# Patient Record
Sex: Female | Born: 1940 | Race: Black or African American | Hispanic: No | Smoking: Never smoker
Health system: Southern US, Community
[De-identification: ages and names within clinical notes are randomized; demographics above are authoritative.]

## PROBLEM LIST (undated history)

## (undated) DIAGNOSIS — N1831 Chronic kidney disease, stage 3a: Secondary | ICD-10-CM

## (undated) DIAGNOSIS — E0590 Thyrotoxicosis, unspecified without thyrotoxic crisis or storm: Secondary | ICD-10-CM

## (undated) DIAGNOSIS — N280 Ischemia and infarction of kidney: Secondary | ICD-10-CM

## (undated) DIAGNOSIS — I10 Essential (primary) hypertension: Secondary | ICD-10-CM

## (undated) DIAGNOSIS — E119 Type 2 diabetes mellitus without complications: Secondary | ICD-10-CM

## (undated) DIAGNOSIS — J189 Pneumonia, unspecified organism: Secondary | ICD-10-CM

## (undated) DIAGNOSIS — K760 Fatty (change of) liver, not elsewhere classified: Secondary | ICD-10-CM

## (undated) DIAGNOSIS — I700 Atherosclerosis of aorta: Secondary | ICD-10-CM

## (undated) DIAGNOSIS — K56609 Unspecified intestinal obstruction, unspecified as to partial versus complete obstruction: Secondary | ICD-10-CM

## (undated) DIAGNOSIS — I639 Cerebral infarction, unspecified: Secondary | ICD-10-CM

## (undated) DIAGNOSIS — M1990 Unspecified osteoarthritis, unspecified site: Secondary | ICD-10-CM

## (undated) DIAGNOSIS — J9859 Other diseases of mediastinum, not elsewhere classified: Secondary | ICD-10-CM

## (undated) DIAGNOSIS — I272 Pulmonary hypertension, unspecified: Secondary | ICD-10-CM

## (undated) DIAGNOSIS — E785 Hyperlipidemia, unspecified: Secondary | ICD-10-CM

## (undated) DIAGNOSIS — K219 Gastro-esophageal reflux disease without esophagitis: Secondary | ICD-10-CM

## (undated) DIAGNOSIS — J9610 Chronic respiratory failure, unspecified whether with hypoxia or hypercapnia: Secondary | ICD-10-CM

## (undated) DIAGNOSIS — I2510 Atherosclerotic heart disease of native coronary artery without angina pectoris: Secondary | ICD-10-CM

## (undated) DIAGNOSIS — N838 Other noninflammatory disorders of ovary, fallopian tube and broad ligament: Secondary | ICD-10-CM

## (undated) DIAGNOSIS — M810 Age-related osteoporosis without current pathological fracture: Secondary | ICD-10-CM

## (undated) HISTORY — DX: Type 2 diabetes mellitus without complications: E11.9

## (undated) HISTORY — DX: Essential (primary) hypertension: I10

## (undated) HISTORY — DX: Age-related osteoporosis without current pathological fracture: M81.0

## (undated) HISTORY — PX: COLONOSCOPY: SHX174

## (undated) HISTORY — PX: CATARACT EXTRACTION W/ INTRAOCULAR LENS  IMPLANT, BILATERAL: SHX1307

## (undated) HISTORY — DX: Hyperlipidemia, unspecified: E78.5

---
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
## 2005-04-08 ENCOUNTER — Ambulatory Visit: Payer: Self-pay | Admitting: Family Medicine

## 2005-04-08 ENCOUNTER — Ambulatory Visit: Payer: Self-pay | Admitting: *Deleted

## 2005-04-29 ENCOUNTER — Ambulatory Visit: Payer: Self-pay | Admitting: Family Medicine

## 2005-05-31 ENCOUNTER — Ambulatory Visit: Payer: Self-pay | Admitting: Family Medicine

## 2005-06-02 ENCOUNTER — Ambulatory Visit (HOSPITAL_COMMUNITY): Admission: RE | Admit: 2005-06-02 | Discharge: 2005-06-02 | Payer: Self-pay | Admitting: Internal Medicine

## 2005-06-17 ENCOUNTER — Ambulatory Visit: Payer: Self-pay | Admitting: Family Medicine

## 2005-08-02 ENCOUNTER — Ambulatory Visit: Payer: Self-pay | Admitting: Family Medicine

## 2005-08-05 ENCOUNTER — Ambulatory Visit: Payer: Self-pay | Admitting: Family Medicine

## 2005-10-10 ENCOUNTER — Ambulatory Visit: Payer: Self-pay | Admitting: Family Medicine

## 2005-12-05 ENCOUNTER — Ambulatory Visit: Payer: Self-pay | Admitting: Family Medicine

## 2005-12-22 ENCOUNTER — Ambulatory Visit: Payer: Self-pay | Admitting: Family Medicine

## 2006-04-25 ENCOUNTER — Ambulatory Visit: Payer: Self-pay | Admitting: Family Medicine

## 2006-05-30 ENCOUNTER — Ambulatory Visit: Payer: Self-pay | Admitting: Family Medicine

## 2006-05-30 ENCOUNTER — Encounter (INDEPENDENT_AMBULATORY_CARE_PROVIDER_SITE_OTHER): Payer: Self-pay | Admitting: Family Medicine

## 2006-06-05 ENCOUNTER — Ambulatory Visit: Payer: Self-pay | Admitting: Family Medicine

## 2006-09-20 ENCOUNTER — Encounter (INDEPENDENT_AMBULATORY_CARE_PROVIDER_SITE_OTHER): Payer: Self-pay | Admitting: *Deleted

## 2006-11-09 ENCOUNTER — Ambulatory Visit: Payer: Self-pay | Admitting: Internal Medicine

## 2007-04-23 ENCOUNTER — Ambulatory Visit: Payer: Self-pay | Admitting: Internal Medicine

## 2007-07-11 ENCOUNTER — Ambulatory Visit: Payer: Self-pay | Admitting: Internal Medicine

## 2007-07-11 ENCOUNTER — Encounter: Payer: Self-pay | Admitting: Family Medicine

## 2007-07-11 LAB — CONVERTED CEMR LAB
ALT: 13 units/L (ref 0–35)
Albumin: 4.2 g/dL (ref 3.5–5.2)
Alkaline Phosphatase: 55 units/L (ref 39–117)
BUN: 25 mg/dL — ABNORMAL HIGH (ref 6–23)
Basophils Absolute: 0 10*3/uL (ref 0.0–0.1)
Basophils Relative: 0 % (ref 0–1)
CO2: 26 meq/L (ref 19–32)
Calcium: 9.3 mg/dL (ref 8.4–10.5)
Chloride: 103 meq/L (ref 96–112)
Cholesterol: 137 mg/dL (ref 0–200)
Creatinine, Ser: 0.76 mg/dL (ref 0.40–1.20)
Eosinophils Relative: 4 % (ref 0–5)
HCT: 38.8 % (ref 36.0–46.0)
HDL: 54 mg/dL (ref >39)
LDL Cholesterol: 75 mg/dL (ref 0–99)
Lymphocytes Relative: 56 % — ABNORMAL HIGH (ref 12–46)
MCHC: 32.5 g/dL (ref 30.0–36.0)
MCV: 84.9 fL (ref 78.0–100.0)
Monocytes Absolute: 0.3 10*3/uL (ref 0.1–1.0)
RBC: 4.57 M/uL (ref 3.87–5.11)
RDW: 16.2 % — ABNORMAL HIGH (ref 11.5–15.5)
Sodium: 141 meq/L (ref 135–145)
TSH: 0.441 microintl units/mL (ref 0.350–4.50)
Triglycerides: 42 mg/dL (ref <150)
VLDL: 8 mg/dL (ref 0–40)

## 2007-09-07 ENCOUNTER — Ambulatory Visit: Payer: Self-pay | Admitting: Family Medicine

## 2007-09-17 ENCOUNTER — Ambulatory Visit: Payer: Self-pay | Admitting: Internal Medicine

## 2007-12-26 ENCOUNTER — Ambulatory Visit: Payer: Self-pay | Admitting: Internal Medicine

## 2008-02-01 ENCOUNTER — Ambulatory Visit: Payer: Self-pay | Admitting: Family Medicine

## 2008-02-21 ENCOUNTER — Ambulatory Visit (HOSPITAL_COMMUNITY): Admission: RE | Admit: 2008-02-21 | Discharge: 2008-02-21 | Payer: Self-pay | Admitting: Internal Medicine

## 2008-02-28 ENCOUNTER — Encounter: Admission: RE | Admit: 2008-02-28 | Discharge: 2008-02-28 | Payer: Self-pay | Admitting: Internal Medicine

## 2008-03-14 ENCOUNTER — Ambulatory Visit: Payer: Self-pay | Admitting: Internal Medicine

## 2008-04-07 ENCOUNTER — Encounter: Payer: Self-pay | Admitting: Internal Medicine

## 2008-04-07 ENCOUNTER — Ambulatory Visit: Payer: Self-pay | Admitting: Family Medicine

## 2008-04-07 DIAGNOSIS — E785 Hyperlipidemia, unspecified: Secondary | ICD-10-CM

## 2008-04-07 DIAGNOSIS — K219 Gastro-esophageal reflux disease without esophagitis: Secondary | ICD-10-CM | POA: Insufficient documentation

## 2008-04-07 DIAGNOSIS — E119 Type 2 diabetes mellitus without complications: Secondary | ICD-10-CM | POA: Insufficient documentation

## 2008-04-07 DIAGNOSIS — J029 Acute pharyngitis, unspecified: Secondary | ICD-10-CM | POA: Insufficient documentation

## 2008-04-07 DIAGNOSIS — I10 Essential (primary) hypertension: Secondary | ICD-10-CM

## 2008-04-28 ENCOUNTER — Ambulatory Visit: Payer: Self-pay | Admitting: Internal Medicine

## 2008-06-10 ENCOUNTER — Ambulatory Visit: Payer: Self-pay | Admitting: Family Medicine

## 2008-06-16 ENCOUNTER — Ambulatory Visit: Payer: Self-pay | Admitting: Family Medicine

## 2009-04-29 ENCOUNTER — Ambulatory Visit: Payer: Self-pay | Admitting: Internal Medicine

## 2009-06-30 ENCOUNTER — Encounter (INDEPENDENT_AMBULATORY_CARE_PROVIDER_SITE_OTHER): Payer: Self-pay | Admitting: Adult Health

## 2009-06-30 ENCOUNTER — Ambulatory Visit: Payer: Self-pay | Admitting: Internal Medicine

## 2009-06-30 LAB — CONVERTED CEMR LAB
ALT: 15 units/L (ref 0–35)
CO2: 31 meq/L (ref 19–32)
Chloride: 98 meq/L (ref 96–112)
HDL: 62 mg/dL (ref >39)
LDL Cholesterol: 107 mg/dL — ABNORMAL HIGH (ref 0–99)
Microalb, Ur: 1.17 mg/dL (ref 0.00–1.89)
Potassium: 3.5 meq/L (ref 3.5–5.3)
Sodium: 141 meq/L (ref 135–145)
Triglycerides: 98 mg/dL (ref <150)
Vit D, 25-Hydroxy: 36 ng/mL (ref 30–89)

## 2009-07-15 ENCOUNTER — Ambulatory Visit (HOSPITAL_COMMUNITY): Admission: RE | Admit: 2009-07-15 | Discharge: 2009-07-15 | Payer: Self-pay | Admitting: Family Medicine

## 2009-11-30 ENCOUNTER — Encounter (INDEPENDENT_AMBULATORY_CARE_PROVIDER_SITE_OTHER): Payer: Self-pay | Admitting: *Deleted

## 2009-11-30 LAB — CONVERTED CEMR LAB
AST: 15 units/L (ref 0–37)
Albumin: 4.2 g/dL (ref 3.5–5.2)
Alkaline Phosphatase: 45 units/L (ref 39–117)
CO2: 32 meq/L (ref 19–32)
Chloride: 101 meq/L (ref 96–112)
Creatinine, Ser: 0.67 mg/dL (ref 0.40–1.20)
HDL: 61 mg/dL (ref >39)
Total Bilirubin: 0.3 mg/dL (ref 0.3–1.2)
Total CHOL/HDL Ratio: 2.8
Triglycerides: 81 mg/dL (ref <150)

## 2010-07-27 ENCOUNTER — Other Ambulatory Visit: Payer: Self-pay | Admitting: Internal Medicine

## 2010-07-27 DIAGNOSIS — Z1231 Encounter for screening mammogram for malignant neoplasm of breast: Secondary | ICD-10-CM

## 2010-08-20 ENCOUNTER — Ambulatory Visit
Admission: RE | Admit: 2010-08-20 | Discharge: 2010-08-20 | Disposition: A | Discharge status: Home / Self Care | Payer: Medicare Other | Source: Ambulatory Visit | Attending: Internal Medicine | Admitting: Internal Medicine

## 2010-08-20 DIAGNOSIS — Z1231 Encounter for screening mammogram for malignant neoplasm of breast: Secondary | ICD-10-CM

## 2011-01-12 DIAGNOSIS — Z23 Encounter for immunization: Secondary | ICD-10-CM | POA: Diagnosis not present

## 2011-02-01 DIAGNOSIS — E1049 Type 1 diabetes mellitus with other diabetic neurological complication: Secondary | ICD-10-CM | POA: Diagnosis not present

## 2011-02-01 DIAGNOSIS — I10 Essential (primary) hypertension: Secondary | ICD-10-CM | POA: Diagnosis not present

## 2011-02-01 DIAGNOSIS — E119 Type 2 diabetes mellitus without complications: Secondary | ICD-10-CM | POA: Diagnosis not present

## 2011-05-03 DIAGNOSIS — E785 Hyperlipidemia, unspecified: Secondary | ICD-10-CM | POA: Diagnosis not present

## 2011-05-03 DIAGNOSIS — E119 Type 2 diabetes mellitus without complications: Secondary | ICD-10-CM | POA: Diagnosis not present

## 2011-05-03 DIAGNOSIS — I10 Essential (primary) hypertension: Secondary | ICD-10-CM | POA: Diagnosis not present

## 2011-08-02 DIAGNOSIS — E119 Type 2 diabetes mellitus without complications: Secondary | ICD-10-CM | POA: Diagnosis not present

## 2011-08-02 DIAGNOSIS — E785 Hyperlipidemia, unspecified: Secondary | ICD-10-CM | POA: Diagnosis not present

## 2011-08-02 DIAGNOSIS — I10 Essential (primary) hypertension: Secondary | ICD-10-CM | POA: Diagnosis not present

## 2011-08-12 ENCOUNTER — Other Ambulatory Visit: Payer: Self-pay | Admitting: Internal Medicine

## 2011-08-12 DIAGNOSIS — Z1231 Encounter for screening mammogram for malignant neoplasm of breast: Secondary | ICD-10-CM

## 2011-09-01 ENCOUNTER — Ambulatory Visit
Admission: RE | Admit: 2011-09-01 | Discharge: 2011-09-01 | Disposition: A | Discharge status: Home / Self Care | Payer: Medicare Other | Source: Ambulatory Visit | Attending: Internal Medicine | Admitting: Internal Medicine

## 2011-09-01 DIAGNOSIS — Z1231 Encounter for screening mammogram for malignant neoplasm of breast: Secondary | ICD-10-CM

## 2011-09-16 DIAGNOSIS — I10 Essential (primary) hypertension: Secondary | ICD-10-CM | POA: Diagnosis not present

## 2011-09-16 DIAGNOSIS — H35039 Hypertensive retinopathy, unspecified eye: Secondary | ICD-10-CM | POA: Diagnosis not present

## 2011-09-16 DIAGNOSIS — H43819 Vitreous degeneration, unspecified eye: Secondary | ICD-10-CM | POA: Diagnosis not present

## 2011-09-16 DIAGNOSIS — E119 Type 2 diabetes mellitus without complications: Secondary | ICD-10-CM | POA: Diagnosis not present

## 2011-11-01 DIAGNOSIS — Z6839 Body mass index (BMI) 39.0-39.9, adult: Secondary | ICD-10-CM | POA: Diagnosis not present

## 2011-11-01 DIAGNOSIS — Z23 Encounter for immunization: Secondary | ICD-10-CM | POA: Diagnosis not present

## 2011-11-01 DIAGNOSIS — I10 Essential (primary) hypertension: Secondary | ICD-10-CM | POA: Diagnosis not present

## 2011-11-01 DIAGNOSIS — E119 Type 2 diabetes mellitus without complications: Secondary | ICD-10-CM | POA: Diagnosis not present

## 2011-11-29 DIAGNOSIS — I10 Essential (primary) hypertension: Secondary | ICD-10-CM | POA: Diagnosis not present

## 2011-11-29 DIAGNOSIS — E119 Type 2 diabetes mellitus without complications: Secondary | ICD-10-CM | POA: Diagnosis not present

## 2011-11-29 DIAGNOSIS — E785 Hyperlipidemia, unspecified: Secondary | ICD-10-CM | POA: Diagnosis not present

## 2011-11-29 DIAGNOSIS — Z6838 Body mass index (BMI) 38.0-38.9, adult: Secondary | ICD-10-CM | POA: Diagnosis not present

## 2012-02-09 DIAGNOSIS — E785 Hyperlipidemia, unspecified: Secondary | ICD-10-CM | POA: Diagnosis not present

## 2012-02-09 DIAGNOSIS — E119 Type 2 diabetes mellitus without complications: Secondary | ICD-10-CM | POA: Diagnosis not present

## 2012-02-09 DIAGNOSIS — I10 Essential (primary) hypertension: Secondary | ICD-10-CM | POA: Diagnosis not present

## 2012-05-17 DIAGNOSIS — I10 Essential (primary) hypertension: Secondary | ICD-10-CM | POA: Diagnosis not present

## 2012-05-17 DIAGNOSIS — E119 Type 2 diabetes mellitus without complications: Secondary | ICD-10-CM | POA: Diagnosis not present

## 2012-05-17 DIAGNOSIS — E785 Hyperlipidemia, unspecified: Secondary | ICD-10-CM | POA: Diagnosis not present

## 2012-10-16 DIAGNOSIS — K3189 Other diseases of stomach and duodenum: Secondary | ICD-10-CM | POA: Diagnosis not present

## 2012-10-16 DIAGNOSIS — I10 Essential (primary) hypertension: Secondary | ICD-10-CM | POA: Diagnosis not present

## 2012-10-16 DIAGNOSIS — Z23 Encounter for immunization: Secondary | ICD-10-CM | POA: Diagnosis not present

## 2012-10-16 DIAGNOSIS — E119 Type 2 diabetes mellitus without complications: Secondary | ICD-10-CM | POA: Diagnosis not present

## 2012-11-02 ENCOUNTER — Other Ambulatory Visit: Payer: Self-pay

## 2012-11-02 DIAGNOSIS — Z1231 Encounter for screening mammogram for malignant neoplasm of breast: Secondary | ICD-10-CM

## 2012-11-09 DIAGNOSIS — H353190 Nonexudative age-related macular degeneration, unspecified eye, stage unspecified: Secondary | ICD-10-CM | POA: Diagnosis not present

## 2012-11-09 DIAGNOSIS — H35039 Hypertensive retinopathy, unspecified eye: Secondary | ICD-10-CM | POA: Diagnosis not present

## 2012-11-09 DIAGNOSIS — H43819 Vitreous degeneration, unspecified eye: Secondary | ICD-10-CM | POA: Diagnosis not present

## 2012-12-07 ENCOUNTER — Ambulatory Visit: Payer: Medicare Other

## 2013-01-16 ENCOUNTER — Ambulatory Visit
Admission: RE | Admit: 2013-01-16 | Discharge: 2013-01-16 | Disposition: A | Discharge status: Home / Self Care | Payer: Medicare Other | Source: Ambulatory Visit

## 2013-01-16 DIAGNOSIS — Z1231 Encounter for screening mammogram for malignant neoplasm of breast: Secondary | ICD-10-CM

## 2013-01-17 ENCOUNTER — Other Ambulatory Visit: Payer: Self-pay | Admitting: Internal Medicine

## 2013-01-17 DIAGNOSIS — R928 Other abnormal and inconclusive findings on diagnostic imaging of breast: Secondary | ICD-10-CM

## 2013-01-30 ENCOUNTER — Other Ambulatory Visit: Payer: Medicare Other

## 2013-02-08 ENCOUNTER — Ambulatory Visit
Admission: RE | Admit: 2013-02-08 | Discharge: 2013-02-08 | Disposition: A | Discharge status: Home / Self Care | Payer: Medicare Other | Source: Ambulatory Visit | Attending: Internal Medicine | Admitting: Internal Medicine

## 2013-02-08 DIAGNOSIS — R928 Other abnormal and inconclusive findings on diagnostic imaging of breast: Secondary | ICD-10-CM

## 2013-02-20 DIAGNOSIS — E785 Hyperlipidemia, unspecified: Secondary | ICD-10-CM | POA: Diagnosis not present

## 2013-02-20 DIAGNOSIS — E1149 Type 2 diabetes mellitus with other diabetic neurological complication: Secondary | ICD-10-CM | POA: Diagnosis not present

## 2013-02-20 DIAGNOSIS — I10 Essential (primary) hypertension: Secondary | ICD-10-CM | POA: Diagnosis not present

## 2013-02-20 DIAGNOSIS — M1710 Unilateral primary osteoarthritis, unspecified knee: Secondary | ICD-10-CM | POA: Diagnosis not present

## 2013-06-19 DIAGNOSIS — E1149 Type 2 diabetes mellitus with other diabetic neurological complication: Secondary | ICD-10-CM | POA: Diagnosis not present

## 2013-06-19 DIAGNOSIS — E785 Hyperlipidemia, unspecified: Secondary | ICD-10-CM | POA: Diagnosis not present

## 2013-06-19 DIAGNOSIS — I10 Essential (primary) hypertension: Secondary | ICD-10-CM | POA: Diagnosis not present

## 2013-06-19 DIAGNOSIS — J209 Acute bronchitis, unspecified: Secondary | ICD-10-CM | POA: Diagnosis not present

## 2013-07-18 DIAGNOSIS — E785 Hyperlipidemia, unspecified: Secondary | ICD-10-CM | POA: Diagnosis not present

## 2013-07-18 DIAGNOSIS — M1710 Unilateral primary osteoarthritis, unspecified knee: Secondary | ICD-10-CM | POA: Diagnosis not present

## 2013-07-18 DIAGNOSIS — I10 Essential (primary) hypertension: Secondary | ICD-10-CM | POA: Diagnosis not present

## 2013-07-18 DIAGNOSIS — E1149 Type 2 diabetes mellitus with other diabetic neurological complication: Secondary | ICD-10-CM | POA: Diagnosis not present

## 2013-12-03 DIAGNOSIS — M179 Osteoarthritis of knee, unspecified: Secondary | ICD-10-CM | POA: Diagnosis not present

## 2013-12-03 DIAGNOSIS — E1140 Type 2 diabetes mellitus with diabetic neuropathy, unspecified: Secondary | ICD-10-CM | POA: Diagnosis not present

## 2013-12-03 DIAGNOSIS — M810 Age-related osteoporosis without current pathological fracture: Secondary | ICD-10-CM | POA: Diagnosis not present

## 2013-12-03 DIAGNOSIS — Z23 Encounter for immunization: Secondary | ICD-10-CM | POA: Diagnosis not present

## 2013-12-03 DIAGNOSIS — Z6841 Body Mass Index (BMI) 40.0 and over, adult: Secondary | ICD-10-CM | POA: Diagnosis not present

## 2013-12-03 DIAGNOSIS — I10 Essential (primary) hypertension: Secondary | ICD-10-CM | POA: Diagnosis not present

## 2013-12-05 ENCOUNTER — Other Ambulatory Visit: Payer: Self-pay | Admitting: Internal Medicine

## 2013-12-16 ENCOUNTER — Other Ambulatory Visit (HOSPITAL_COMMUNITY): Payer: Self-pay | Admitting: Internal Medicine

## 2013-12-16 DIAGNOSIS — M810 Age-related osteoporosis without current pathological fracture: Secondary | ICD-10-CM

## 2013-12-31 ENCOUNTER — Ambulatory Visit (HOSPITAL_COMMUNITY)
Admission: RE | Admit: 2013-12-31 | Discharge: 2013-12-31 | Disposition: A | Discharge status: Home / Self Care | Payer: Medicare Other | Source: Ambulatory Visit | Attending: Internal Medicine | Admitting: Internal Medicine

## 2013-12-31 DIAGNOSIS — M8589 Other specified disorders of bone density and structure, multiple sites: Secondary | ICD-10-CM | POA: Diagnosis not present

## 2013-12-31 DIAGNOSIS — Z780 Asymptomatic menopausal state: Secondary | ICD-10-CM | POA: Diagnosis not present

## 2013-12-31 DIAGNOSIS — M810 Age-related osteoporosis without current pathological fracture: Secondary | ICD-10-CM | POA: Diagnosis not present

## 2014-03-04 ENCOUNTER — Other Ambulatory Visit: Payer: Self-pay

## 2014-03-04 DIAGNOSIS — Z1239 Encounter for other screening for malignant neoplasm of breast: Secondary | ICD-10-CM | POA: Diagnosis not present

## 2014-03-04 DIAGNOSIS — I10 Essential (primary) hypertension: Secondary | ICD-10-CM | POA: Diagnosis not present

## 2014-03-04 DIAGNOSIS — M179 Osteoarthritis of knee, unspecified: Secondary | ICD-10-CM | POA: Diagnosis not present

## 2014-03-04 DIAGNOSIS — Z1211 Encounter for screening for malignant neoplasm of colon: Secondary | ICD-10-CM | POA: Diagnosis not present

## 2014-03-04 DIAGNOSIS — M810 Age-related osteoporosis without current pathological fracture: Secondary | ICD-10-CM | POA: Diagnosis not present

## 2014-03-04 DIAGNOSIS — Z1231 Encounter for screening mammogram for malignant neoplasm of breast: Secondary | ICD-10-CM

## 2014-03-04 DIAGNOSIS — E1140 Type 2 diabetes mellitus with diabetic neuropathy, unspecified: Secondary | ICD-10-CM | POA: Diagnosis not present

## 2014-03-04 DIAGNOSIS — K219 Gastro-esophageal reflux disease without esophagitis: Secondary | ICD-10-CM | POA: Diagnosis not present

## 2014-03-04 DIAGNOSIS — E784 Other hyperlipidemia: Secondary | ICD-10-CM | POA: Diagnosis not present

## 2014-03-05 ENCOUNTER — Encounter: Payer: Self-pay | Admitting: Internal Medicine

## 2014-03-12 ENCOUNTER — Ambulatory Visit
Admission: RE | Admit: 2014-03-12 | Discharge: 2014-03-12 | Disposition: A | Discharge status: Home / Self Care | Payer: Medicare Other | Source: Ambulatory Visit

## 2014-03-12 ENCOUNTER — Encounter (INDEPENDENT_AMBULATORY_CARE_PROVIDER_SITE_OTHER): Payer: Self-pay

## 2014-03-12 DIAGNOSIS — Z1231 Encounter for screening mammogram for malignant neoplasm of breast: Secondary | ICD-10-CM | POA: Diagnosis not present

## 2014-04-09 DIAGNOSIS — H3530 Unspecified macular degeneration: Secondary | ICD-10-CM | POA: Diagnosis not present

## 2014-04-09 DIAGNOSIS — H2512 Age-related nuclear cataract, left eye: Secondary | ICD-10-CM | POA: Diagnosis not present

## 2014-04-25 ENCOUNTER — Ambulatory Visit (AMBULATORY_SURGERY_CENTER): Payer: Self-pay | Admitting: *Deleted

## 2014-04-25 VITALS — Ht <= 58 in | Wt 191.0 lb

## 2014-04-25 DIAGNOSIS — Z1211 Encounter for screening for malignant neoplasm of colon: Secondary | ICD-10-CM

## 2014-04-25 MED ORDER — MOVIPREP 100 G PO SOLR: ORAL | Status: DC

## 2014-04-25 NOTE — Progress Notes (Signed)
Daughter is at pt's side interpreting. Patient/daughter denies any allergies to eggs or soy. Patient denies any problems with sedation, denies any oxygen use at home and does not take any diet/weight loss medications. EMMI education assisgned to patient on colonoscopy, this was explained and instructions given to patient's daughter.

## 2014-04-28 ENCOUNTER — Telehealth: Payer: Self-pay | Admitting: *Deleted

## 2014-04-28 MED ORDER — NA SULFATE-K SULFATE-MG SULF 17.5-3.13-1.6 GM/177ML PO SOLN: 1.0000 | Freq: Once | ORAL | Status: AC

## 2014-04-28 NOTE — Telephone Encounter (Signed)
Moviprep was not covered by insurance. Sent Rx for [employer] to [facility] off [address] on 04/28/14.

## 2014-05-09 ENCOUNTER — Other Ambulatory Visit: Payer: Self-pay | Admitting: Internal Medicine

## 2014-05-09 ENCOUNTER — Encounter: Payer: Self-pay | Admitting: Internal Medicine

## 2014-05-09 ENCOUNTER — Ambulatory Visit (AMBULATORY_SURGERY_CENTER): Payer: Medicare Other | Admitting: Internal Medicine

## 2014-05-09 VITALS — BP 128/70 | HR 68 | Temp 97.4°F | Resp 19 | Ht <= 58 in | Wt 191.0 lb

## 2014-05-09 DIAGNOSIS — E119 Type 2 diabetes mellitus without complications: Secondary | ICD-10-CM | POA: Diagnosis not present

## 2014-05-09 DIAGNOSIS — Z1211 Encounter for screening for malignant neoplasm of colon: Secondary | ICD-10-CM

## 2014-05-09 DIAGNOSIS — I10 Essential (primary) hypertension: Secondary | ICD-10-CM | POA: Diagnosis not present

## 2014-05-09 DIAGNOSIS — K621 Rectal polyp: Secondary | ICD-10-CM

## 2014-05-09 DIAGNOSIS — D128 Benign neoplasm of rectum: Secondary | ICD-10-CM

## 2014-05-09 MED ORDER — SODIUM CHLORIDE 0.9 % IV SOLN: 500.0000 mL | INTRAVENOUS | Status: DC

## 2014-05-09 NOTE — Progress Notes (Signed)
Daughter [name], Veterinary surgeon for mother Government social research officer language for communication

## 2014-05-09 NOTE — Op Note (Signed)
[facility]  [address] [place], [address]   COLONOSCOPY PROCEDURE REPORT  PATIENT: Mandy [name], Mandy [name]  MR#: [id number] BIRTHDATE: May 18, 1940 , 32  yrs. old GENDER: female ENDOSCOPIST: Lafayette Dragon, MD REFERRED OI:[id number] Jeanie Cooks, M.D. PROCEDURE DATE:  05/09/2014 PROCEDURE:   Colonoscopy, screening and Colonoscopy with biopsy First Screening Colonoscopy - Avg.  risk and is 50 yrs.  old or older Yes.  Prior Negative Screening - Now for repeat screening. N/A  History of Adenoma - Now for follow-up colonoscopy & has been > or = to 3 yrs.  N/A  Polyps Removed Today ASA CLASS:   Class II INDICATIONS:Screening for colonic neoplasia and Colorectal Neoplasm Risk Assessment for this procedure is average risk. MEDICATIONS: Monitored anesthesia care and Propofol 200 mg IV  DESCRIPTION OF PROCEDURE:   After the risks benefits and alternatives of the procedure were thoroughly explained, informed consent was obtained.  The digital rectal exam revealed no abnormalities of the rectum.   The LB [id number]  endoscope was introduced through the anus and advanced to the cecum, which was identified by both the appendix and ileocecal valve. No adverse events experienced.   The quality of the prep was excellent. (MoviPrep was used)  The instrument was then slowly withdrawn as the colon was fully examined.      COLON FINDINGS: A firm sessile polyp measuring 3 mm in size was found in the rectum.  A polypectomy was performed with cold forceps.  The resection was complete, the polyp tissue was completely retrieved and sent to histology.  Retroflexed views revealed no abnormalities. The time to cecum = 7.16 Withdrawal time = 6.21   The scope was withdrawn and the procedure completed. COMPLICATIONS: There were no immediate complications.  ENDOSCOPIC IMPRESSION: Sessile polyp was found in the rectum; polypectomy was performed with cold forceps  RECOMMENDATIONS: 1.  Await  pathology results 2.  High-fiber diet Recall colonoscopy pending path report  eSigned:  Lafayette Dragon, MD 05/09/2014 11:09 AM   cc:

## 2014-05-09 NOTE — Patient Instructions (Signed)
1 polyp removed and sent to pathology.  Await pathology for final recommendation.    YOU HAD AN ENDOSCOPIC PROCEDURE TODAY AT [facility]:   Refer to the procedure report that was given to you for any specific questions about what was found during the examination.  If the procedure report does not answer your questions, please call your gastroenterologist to clarify.  If you requested that your care partner not be given the details of your procedure findings, then the procedure report has been included in a sealed envelope for you to review at your convenience later.  YOU SHOULD EXPECT: Some feelings of bloating in the abdomen. Passage of more gas than usual.  Walking can help get rid of the air that was put into your GI tract during the procedure and reduce the bloating. If you had a lower endoscopy (such as a colonoscopy or flexible sigmoidoscopy) you may notice spotting of blood in your stool or on the toilet paper. If you underwent a bowel prep for your procedure, you may not have a normal bowel movement for a few days.  Please Note:  You might notice some irritation and congestion in your nose or some drainage.  This is from the oxygen used during your procedure.  There is no need for concern and it should clear up in a day or so.  SYMPTOMS TO REPORT IMMEDIATELY:   Following lower endoscopy (colonoscopy or flexible sigmoidoscopy):  Excessive amounts of blood in the stool  Significant tenderness or worsening of abdominal pains  Swelling of the abdomen that is new, acute  Fever of 100F or higher   Following upper endoscopy (EGD)  Vomiting of blood or coffee ground material  New chest pain or pain under the shoulder blades  Painful or persistently difficult swallowing  New shortness of breath  Fever of 100F or higher  Black, tarry-looking stools  For urgent or emergent issues, a gastroenterologist can be reached at any hour by calling [phone].   DIET: Your  first meal following the procedure should be a small meal and then it is ok to progress to your normal diet. Heavy or fried foods are harder to digest and may make you feel nauseous or bloated.  Likewise, meals heavy in dairy and vegetables can increase bloating.  Drink plenty of fluids but you should avoid alcoholic beverages for 24 hours.  ACTIVITY:  You should plan to take it easy for the rest of today and you should NOT DRIVE or use heavy machinery until tomorrow (because of the sedation medicines used during the test).    FOLLOW UP: Our staff will call the number listed on your records the next business day following your procedure to check on you and address any questions or concerns that you may have regarding the information given to you following your procedure. If we do not reach you, we will leave a message.  However, if you are feeling well and you are not experiencing any problems, there is no need to return our call.  We will assume that you have returned to your regular daily activities without incident.  If any biopsies were taken you will be contacted by phone or by letter within the next 1-3 weeks.  Please call us at [phone] if you have not heard about the biopsies in 3 weeks.    SIGNATURES/CONFIDENTIALITY: You and/or your care partner have signed paperwork which will be entered into your electronic medical record.  These signatures attest  to the fact that that the information above on your After Visit Summary has been reviewed and is understood.  Full responsibility of the confidentiality of this discharge information lies with you and/or your care-partner. 

## 2014-05-09 NOTE — Progress Notes (Signed)
Called to room to assist during endoscopic procedure.  Patient ID and intended procedure confirmed with present staff. Received instructions for my participation in the procedure from the performing physician.  

## 2014-05-09 NOTE — Progress Notes (Signed)
To recovery, report given and VSS. 

## 2014-05-12 ENCOUNTER — Telehealth: Payer: Self-pay | Admitting: *Deleted

## 2014-05-12 NOTE — Telephone Encounter (Signed)
  Follow up Call-  Call back number 05/09/2014  Post procedure Call Back phone  # [phone]  Permission to leave phone message Yes     "wrong number" per female that answered the phone.

## 2014-05-14 ENCOUNTER — Encounter: Payer: Self-pay | Admitting: Internal Medicine

## 2014-06-04 DIAGNOSIS — E1140 Type 2 diabetes mellitus with diabetic neuropathy, unspecified: Secondary | ICD-10-CM | POA: Diagnosis not present

## 2014-06-04 DIAGNOSIS — E119 Type 2 diabetes mellitus without complications: Secondary | ICD-10-CM | POA: Diagnosis not present

## 2014-06-04 DIAGNOSIS — M179 Osteoarthritis of knee, unspecified: Secondary | ICD-10-CM | POA: Diagnosis not present

## 2014-06-04 DIAGNOSIS — I10 Essential (primary) hypertension: Secondary | ICD-10-CM | POA: Diagnosis not present

## 2014-06-04 DIAGNOSIS — K219 Gastro-esophageal reflux disease without esophagitis: Secondary | ICD-10-CM | POA: Diagnosis not present

## 2014-06-04 DIAGNOSIS — E784 Other hyperlipidemia: Secondary | ICD-10-CM | POA: Diagnosis not present

## 2014-07-02 ENCOUNTER — Emergency Department (HOSPITAL_COMMUNITY): Payer: Medicare Other

## 2014-07-02 ENCOUNTER — Emergency Department (HOSPITAL_COMMUNITY)
Admission: EM | Admit: 2014-07-02 | Discharge: 2014-07-02 | Disposition: A | Discharge status: Home / Self Care | Payer: Medicare Other | Attending: Emergency Medicine | Admitting: Emergency Medicine

## 2014-07-02 ENCOUNTER — Encounter (HOSPITAL_COMMUNITY): Payer: Self-pay | Admitting: Emergency Medicine

## 2014-07-02 DIAGNOSIS — Z8739 Personal history of other diseases of the musculoskeletal system and connective tissue: Secondary | ICD-10-CM | POA: Diagnosis not present

## 2014-07-02 DIAGNOSIS — E119 Type 2 diabetes mellitus without complications: Secondary | ICD-10-CM | POA: Diagnosis not present

## 2014-07-02 DIAGNOSIS — J189 Pneumonia, unspecified organism: Secondary | ICD-10-CM | POA: Diagnosis not present

## 2014-07-02 DIAGNOSIS — I10 Essential (primary) hypertension: Secondary | ICD-10-CM | POA: Insufficient documentation

## 2014-07-02 DIAGNOSIS — Z791 Long term (current) use of non-steroidal anti-inflammatories (NSAID): Secondary | ICD-10-CM | POA: Diagnosis not present

## 2014-07-02 DIAGNOSIS — R079 Chest pain, unspecified: Secondary | ICD-10-CM | POA: Diagnosis not present

## 2014-07-02 DIAGNOSIS — Z79899 Other long term (current) drug therapy: Secondary | ICD-10-CM | POA: Insufficient documentation

## 2014-07-02 DIAGNOSIS — E785 Hyperlipidemia, unspecified: Secondary | ICD-10-CM | POA: Diagnosis not present

## 2014-07-02 DIAGNOSIS — J159 Unspecified bacterial pneumonia: Secondary | ICD-10-CM | POA: Insufficient documentation

## 2014-07-02 DIAGNOSIS — R05 Cough: Secondary | ICD-10-CM | POA: Diagnosis present

## 2014-07-02 MED ORDER — DOXYCYCLINE HYCLATE 100 MG PO CAPS: 100.0000 mg | ORAL_CAPSULE | Freq: Two times a day (BID) | ORAL | Status: DC

## 2014-07-02 MED ORDER — BENZONATATE 100 MG PO CAPS: 100.0000 mg | ORAL_CAPSULE | Freq: Three times a day (TID) | ORAL | Status: DC

## 2014-07-02 NOTE — ED Notes (Signed)
Pt stable, ambulatory, states understanding of discharge instructions 

## 2014-07-02 NOTE — ED Provider Notes (Signed)
CSN: [id number]     Arrival date & time 07/02/14  2017 History  This chart was scribed for Mandy Carne, PA-C, working with Mandy Gambler, MD by Steva Colder, ED Scribe. The patient was seen in room TR09C/TR09C at 9:27 PM.    Chief Complaint  Patient presents with  . Cough     The history is provided by the patient and a relative. A language interpreter was used (patient daughter).    HPI Comments: Mandy [name] is a 74 y.o. female with a medical hx of HTN and DM who presents to the Emergency Department complaining of persistent productive cough onset 2 weeks. Pt notes that the cough is productive of thick white sputum. She states that she is having associated symptoms of nasal congestion and mild wheezing. She states that she has tried OTC medications with no relief for her symptoms. She denies SOB, CP, ear pain, sore throat, fever, chills, abdominal pain, n/v, palpitations, HA, dizziness, and any other symptoms. Pt is not a smoker. Pt denies having a hx of pneumonia. Pt notes that she checks her blood sugar regularly and today it was 130. Denies allergies to medications.  Past Medical History  Diagnosis Date  . Hypertension   . Hyperlipidemia   . Diabetes mellitus without complication   . Osteoporosis    Past Surgical History  Procedure Laterality Date  . Cataract extraction w/ intraocular lens  implant, bilateral     Family History  Problem Relation Age of Onset  . Colon cancer Neg Hx    History  Substance Use Topics  . Smoking status: Never Smoker   . Smokeless tobacco: Never Used  . Alcohol Use: No   OB History    No data available     Review of Systems  Constitutional: Negative for fever and chills.  HENT: Positive for congestion. Negative for ear pain, rhinorrhea and sore throat.   Eyes: Negative for pain.  Respiratory: Positive for cough and wheezing. Negative for shortness of breath.   Cardiovascular: Negative for chest pain and palpitations.   Gastrointestinal: Negative for nausea, vomiting and abdominal pain.  Skin: Negative for rash.  Neurological: Negative for dizziness, light-headedness and headaches.      Allergies  Review of patient's allergies indicates no known allergies.  Home Medications   Prior to Admission medications   Medication Sig Start Date End Date Taking? Authorizing Provider  alendronate (FOSAMAX) 70 MG tablet Take 70 mg by mouth once a week. Take with a full glass of water on an empty stomach.    Historical Provider, MD  amLODipine (NORVASC) 10 MG tablet Take 10 mg by mouth daily.    Historical Provider, MD  atenolol (TENORMIN) 50 MG tablet Take 50 mg by mouth 2 (two) times daily.    Historical Provider, MD  benazepril-hydrochlorthiazide (LOTENSIN HCT) 20-25 MG per tablet Take 1 tablet by mouth daily.    Historical Provider, MD  benzonatate (TESSALON) 100 MG capsule Take 1 capsule (100 mg total) by mouth every 8 (eight) hours. 07/02/14   Waynetta Pean, PA-C  cyclobenzaprine (FLEXERIL) 10 MG tablet Take 10 mg by mouth at bedtime.    Historical Provider, MD  diclofenac (VOLTAREN) 75 MG EC tablet Take 75 mg by mouth 2 (two) times daily.    Historical Provider, MD  doxycycline (VIBRAMYCIN) 100 MG capsule Take 1 capsule (100 mg total) by mouth 2 (two) times daily. 07/02/14   Waynetta Pean, PA-C  metFORMIN (GLUCOPHAGE) 500 MG tablet Take by mouth 2 (two)  times daily with a meal.    Historical Provider, MD  rosuvastatin (CRESTOR) 10 MG tablet Take 10 mg by mouth daily.    Historical Provider, MD   BP 143/90 mmHg  Pulse 97  Temp(Src) 98.6 F (37 C) (Oral)  Resp 14  Ht 4\' 11"  (1.499 m)  Wt 195 lb (88.451 kg)  BMI 39.36 kg/m2  SpO2 96% Physical Exam  Constitutional: She appears well-developed and well-nourished. No distress.  Nontoxic appearing.  HENT:  Head: Normocephalic and atraumatic.  Right Ear: External ear normal.  Left Ear: External ear normal.  Mouth/Throat: Oropharynx is clear and moist. No  oropharyngeal exudate.  Eyes: Conjunctivae are normal. Pupils are equal, round, and reactive to light. Right eye exhibits no discharge. Left eye exhibits no discharge.  Neck: Neck supple. No JVD present. No tracheal deviation present.  Cardiovascular: Normal rate, regular rhythm, normal heart sounds and intact distal pulses.   Pulmonary/Chest: Effort normal. No respiratory distress. She has no wheezes. She has no rales.  Diminished breath sounds in right base with mild crackles. No wheezes noted.  Abdominal: Soft. There is no tenderness.  Musculoskeletal: She exhibits no edema.  Lymphadenopathy:    She has no cervical adenopathy.  Neurological: She is alert. Coordination normal.  Skin: Skin is warm and dry. No rash noted. She is not diaphoretic. No erythema. No pallor.  Psychiatric: She has a normal mood and affect. Her behavior is normal.  Nursing note and vitals reviewed.   ED Course  Procedures (including critical care time) DIAGNOSTIC STUDIES: Oxygen Saturation is 96% on RA, nl by my interpretation.    COORDINATION OF CARE: 9:32 PM-Discussed treatment plan which includes CXR, abx Rx, and f/u with PCP with pt at bedside and pt agreed to plan.   Labs Review Labs Reviewed - No data to display  Imaging Review Dg Chest 2 View  07/02/2014   CLINICAL DATA:  Cough for 2 weeks with chest pain  EXAM: CHEST - 2 VIEW  COMPARISON:  None.  FINDINGS: Cardiac shadow is mildly enlarged. The left lung is clear. Patchy infiltrate is noted in the right lower lobe. Mild degenerative change of thoracic spine is noted.  IMPRESSION: Right basilar infiltrate   Electronically Signed   By: Inez Catalina M.D.   On: 07/02/2014 21:21     EKG Interpretation None      Filed Vitals:   07/02/14 2031  BP: 143/90  Pulse: 97  Temp: 98.6 F (37 C)  TempSrc: Oral  Resp: 14  Height: 4\' 11"  (1.499 m)  Weight: 195 lb (88.451 kg)  SpO2: 96%     MDM   Meds given in ED:  Medications - No data to  display  New Prescriptions   BENZONATATE (TESSALON) 100 MG CAPSULE    Take 1 capsule (100 mg total) by mouth every 8 (eight) hours.   DOXYCYCLINE (VIBRAMYCIN) 100 MG CAPSULE    Take 1 capsule (100 mg total) by mouth 2 (two) times daily.    Final diagnoses:  Community acquired pneumonia   This is a 74 year old female with a history of diabetes who presents to the emergency department complaining of a productive cough and chest congestion ongoing for the past 2 weeks. She denies any chest pain or shortness of breath. On exam the patient is afebrile and nontoxic appearing. She does have slightly diminished lung sounds in her right base with mild crackles. There is no wheezes noted. Patient is speaking in full sentences. Patient saturation is 96%  on room air. Chest x-ray indicated a right basilar infiltrate. Will treat the patient with doxycycline and have her follow-up with her primary care provider at the beginning of next week. Strict return precautions provided. I advised the patient to follow-up with their primary care provider this week. I advised the patient to return to the emergency department with new or worsening symptoms or new concerns. The patient verbalized understanding and agreement with plan.    This patient was discussed with and evaluated by Dr. Regenia Skeeter who agrees with assessment and plan.   I personally performed the services described in this documentation, which was scribed in my presence. The recorded information has been reviewed and is accurate.    Waynetta Pean, PA-C 07/02/14 2145  Mandy Gambler, MD 07/03/14 [phone]

## 2014-07-02 NOTE — ED Notes (Signed)
Pt. reports persistent productive cough and chest congestion for 2 weeks unrelieved by OTC cough medications , denies fever or chills. Denies SOB .

## 2014-07-02 NOTE — Discharge Instructions (Signed)

## 2014-09-04 DIAGNOSIS — E1140 Type 2 diabetes mellitus with diabetic neuropathy, unspecified: Secondary | ICD-10-CM | POA: Diagnosis not present

## 2014-09-04 DIAGNOSIS — I10 Essential (primary) hypertension: Secondary | ICD-10-CM | POA: Diagnosis not present

## 2014-09-04 DIAGNOSIS — E784 Other hyperlipidemia: Secondary | ICD-10-CM | POA: Diagnosis not present

## 2014-09-04 DIAGNOSIS — M179 Osteoarthritis of knee, unspecified: Secondary | ICD-10-CM | POA: Diagnosis not present

## 2014-09-04 DIAGNOSIS — K219 Gastro-esophageal reflux disease without esophagitis: Secondary | ICD-10-CM | POA: Diagnosis not present

## 2015-01-15 DIAGNOSIS — Z719 Counseling, unspecified: Secondary | ICD-10-CM | POA: Diagnosis not present

## 2015-01-15 DIAGNOSIS — M179 Osteoarthritis of knee, unspecified: Secondary | ICD-10-CM | POA: Diagnosis not present

## 2015-01-15 DIAGNOSIS — E784 Other hyperlipidemia: Secondary | ICD-10-CM | POA: Diagnosis not present

## 2015-01-15 DIAGNOSIS — E1140 Type 2 diabetes mellitus with diabetic neuropathy, unspecified: Secondary | ICD-10-CM | POA: Diagnosis not present

## 2015-01-15 DIAGNOSIS — I10 Essential (primary) hypertension: Secondary | ICD-10-CM | POA: Diagnosis not present

## 2015-03-03 DIAGNOSIS — E119 Type 2 diabetes mellitus without complications: Secondary | ICD-10-CM | POA: Diagnosis not present

## 2015-03-03 DIAGNOSIS — H2512 Age-related nuclear cataract, left eye: Secondary | ICD-10-CM | POA: Diagnosis not present

## 2015-03-03 DIAGNOSIS — H3530 Unspecified macular degeneration: Secondary | ICD-10-CM | POA: Diagnosis not present

## 2015-04-16 DIAGNOSIS — I10 Essential (primary) hypertension: Secondary | ICD-10-CM | POA: Diagnosis not present

## 2015-04-16 DIAGNOSIS — M179 Osteoarthritis of knee, unspecified: Secondary | ICD-10-CM | POA: Diagnosis not present

## 2015-04-16 DIAGNOSIS — E1140 Type 2 diabetes mellitus with diabetic neuropathy, unspecified: Secondary | ICD-10-CM | POA: Diagnosis not present

## 2015-04-16 DIAGNOSIS — Z1239 Encounter for other screening for malignant neoplasm of breast: Secondary | ICD-10-CM | POA: Diagnosis not present

## 2015-04-16 DIAGNOSIS — M810 Age-related osteoporosis without current pathological fracture: Secondary | ICD-10-CM | POA: Diagnosis not present

## 2015-04-16 DIAGNOSIS — E876 Hypokalemia: Secondary | ICD-10-CM | POA: Diagnosis not present

## 2015-04-16 DIAGNOSIS — E784 Other hyperlipidemia: Secondary | ICD-10-CM | POA: Diagnosis not present

## 2015-04-16 DIAGNOSIS — K219 Gastro-esophageal reflux disease without esophagitis: Secondary | ICD-10-CM | POA: Diagnosis not present

## 2015-06-09 ENCOUNTER — Other Ambulatory Visit: Payer: Self-pay | Admitting: Internal Medicine

## 2015-06-09 DIAGNOSIS — Z1231 Encounter for screening mammogram for malignant neoplasm of breast: Secondary | ICD-10-CM

## 2015-06-23 ENCOUNTER — Ambulatory Visit: Payer: Self-pay

## 2015-06-26 ENCOUNTER — Ambulatory Visit
Admission: RE | Admit: 2015-06-26 | Discharge: 2015-06-26 | Disposition: A | Discharge status: Home / Self Care | Payer: Medicare Other | Source: Ambulatory Visit | Attending: Internal Medicine | Admitting: Internal Medicine

## 2015-06-26 DIAGNOSIS — Z1231 Encounter for screening mammogram for malignant neoplasm of breast: Secondary | ICD-10-CM

## 2015-08-06 DIAGNOSIS — E1140 Type 2 diabetes mellitus with diabetic neuropathy, unspecified: Secondary | ICD-10-CM | POA: Diagnosis not present

## 2015-08-06 DIAGNOSIS — Z23 Encounter for immunization: Secondary | ICD-10-CM | POA: Diagnosis not present

## 2015-08-06 DIAGNOSIS — E1040 Type 1 diabetes mellitus with diabetic neuropathy, unspecified: Secondary | ICD-10-CM | POA: Diagnosis not present

## 2015-08-06 DIAGNOSIS — M179 Osteoarthritis of knee, unspecified: Secondary | ICD-10-CM | POA: Diagnosis not present

## 2015-08-06 DIAGNOSIS — I10 Essential (primary) hypertension: Secondary | ICD-10-CM | POA: Diagnosis not present

## 2015-09-05 DIAGNOSIS — Z23 Encounter for immunization: Secondary | ICD-10-CM | POA: Diagnosis not present

## 2015-11-12 DIAGNOSIS — E784 Other hyperlipidemia: Secondary | ICD-10-CM | POA: Diagnosis not present

## 2015-11-12 DIAGNOSIS — E1140 Type 2 diabetes mellitus with diabetic neuropathy, unspecified: Secondary | ICD-10-CM | POA: Diagnosis not present

## 2015-11-12 DIAGNOSIS — I10 Essential (primary) hypertension: Secondary | ICD-10-CM | POA: Diagnosis not present

## 2015-11-12 DIAGNOSIS — M179 Osteoarthritis of knee, unspecified: Secondary | ICD-10-CM | POA: Diagnosis not present

## 2015-11-12 DIAGNOSIS — E669 Obesity, unspecified: Secondary | ICD-10-CM | POA: Diagnosis not present

## 2015-11-12 DIAGNOSIS — Z23 Encounter for immunization: Secondary | ICD-10-CM | POA: Diagnosis not present

## 2016-07-15 ENCOUNTER — Other Ambulatory Visit: Payer: Self-pay | Admitting: Internal Medicine

## 2016-07-15 DIAGNOSIS — Z1231 Encounter for screening mammogram for malignant neoplasm of breast: Secondary | ICD-10-CM

## 2016-07-20 ENCOUNTER — Ambulatory Visit: Payer: Self-pay

## 2016-11-23 ENCOUNTER — Ambulatory Visit
Admission: RE | Admit: 2016-11-23 | Discharge: 2016-11-23 | Disposition: A | Discharge status: Home / Self Care | Payer: Medicare Other | Source: Ambulatory Visit | Attending: Internal Medicine | Admitting: Internal Medicine

## 2016-11-23 DIAGNOSIS — Z1231 Encounter for screening mammogram for malignant neoplasm of breast: Secondary | ICD-10-CM | POA: Diagnosis not present

## 2016-11-29 DIAGNOSIS — Z23 Encounter for immunization: Secondary | ICD-10-CM | POA: Diagnosis not present

## 2016-11-29 DIAGNOSIS — E7849 Other hyperlipidemia: Secondary | ICD-10-CM | POA: Diagnosis not present

## 2016-11-29 DIAGNOSIS — M179 Osteoarthritis of knee, unspecified: Secondary | ICD-10-CM | POA: Diagnosis not present

## 2016-11-29 DIAGNOSIS — I10 Essential (primary) hypertension: Secondary | ICD-10-CM | POA: Diagnosis not present

## 2016-11-29 DIAGNOSIS — E1140 Type 2 diabetes mellitus with diabetic neuropathy, unspecified: Secondary | ICD-10-CM | POA: Diagnosis not present

## 2017-04-04 DIAGNOSIS — M545 Low back pain: Secondary | ICD-10-CM | POA: Diagnosis not present

## 2017-04-04 DIAGNOSIS — E2839 Other primary ovarian failure: Secondary | ICD-10-CM | POA: Diagnosis not present

## 2017-04-04 DIAGNOSIS — I10 Essential (primary) hypertension: Secondary | ICD-10-CM | POA: Diagnosis not present

## 2017-04-04 DIAGNOSIS — Z0001 Encounter for general adult medical examination with abnormal findings: Secondary | ICD-10-CM | POA: Diagnosis not present

## 2017-04-04 DIAGNOSIS — E1040 Type 1 diabetes mellitus with diabetic neuropathy, unspecified: Secondary | ICD-10-CM | POA: Diagnosis not present

## 2017-04-04 DIAGNOSIS — E1140 Type 2 diabetes mellitus with diabetic neuropathy, unspecified: Secondary | ICD-10-CM | POA: Diagnosis not present

## 2017-04-04 DIAGNOSIS — E7849 Other hyperlipidemia: Secondary | ICD-10-CM | POA: Diagnosis not present

## 2017-04-10 ENCOUNTER — Other Ambulatory Visit: Payer: Self-pay | Admitting: Internal Medicine

## 2017-04-10 DIAGNOSIS — E2839 Other primary ovarian failure: Secondary | ICD-10-CM

## 2017-05-03 ENCOUNTER — Inpatient Hospital Stay
Admission: RE | Admit: 2017-05-03 | Discharge: 2017-05-03 | Disposition: A | Discharge status: Home / Self Care | Payer: Medicare Other | Source: Ambulatory Visit | Attending: Internal Medicine | Admitting: Internal Medicine

## 2017-05-12 ENCOUNTER — Inpatient Hospital Stay: Admission: RE | Admit: 2017-05-12 | Payer: Medicare Other | Source: Ambulatory Visit

## 2017-06-19 ENCOUNTER — Ambulatory Visit
Admission: RE | Admit: 2017-06-19 | Discharge: 2017-06-19 | Disposition: A | Discharge status: Home / Self Care | Payer: Medicare HMO | Source: Ambulatory Visit | Attending: Internal Medicine | Admitting: Internal Medicine

## 2017-06-19 DIAGNOSIS — E2839 Other primary ovarian failure: Secondary | ICD-10-CM

## 2017-06-19 DIAGNOSIS — M8589 Other specified disorders of bone density and structure, multiple sites: Secondary | ICD-10-CM | POA: Diagnosis not present

## 2017-06-19 DIAGNOSIS — Z780 Asymptomatic menopausal state: Secondary | ICD-10-CM | POA: Diagnosis not present

## 2017-07-04 DIAGNOSIS — E7849 Other hyperlipidemia: Secondary | ICD-10-CM | POA: Diagnosis not present

## 2017-07-04 DIAGNOSIS — I10 Essential (primary) hypertension: Secondary | ICD-10-CM | POA: Diagnosis not present

## 2017-07-04 DIAGNOSIS — M545 Low back pain: Secondary | ICD-10-CM | POA: Diagnosis not present

## 2017-07-04 DIAGNOSIS — E1140 Type 2 diabetes mellitus with diabetic neuropathy, unspecified: Secondary | ICD-10-CM | POA: Diagnosis not present

## 2017-08-30 DIAGNOSIS — H43813 Vitreous degeneration, bilateral: Secondary | ICD-10-CM | POA: Diagnosis not present

## 2017-08-30 DIAGNOSIS — H353130 Nonexudative age-related macular degeneration, bilateral, stage unspecified: Secondary | ICD-10-CM | POA: Diagnosis not present

## 2017-08-30 DIAGNOSIS — H2512 Age-related nuclear cataract, left eye: Secondary | ICD-10-CM | POA: Diagnosis not present

## 2017-08-30 DIAGNOSIS — E119 Type 2 diabetes mellitus without complications: Secondary | ICD-10-CM | POA: Diagnosis not present

## 2017-10-05 DIAGNOSIS — Z23 Encounter for immunization: Secondary | ICD-10-CM | POA: Diagnosis not present

## 2017-10-05 DIAGNOSIS — I10 Essential (primary) hypertension: Secondary | ICD-10-CM | POA: Diagnosis not present

## 2017-10-05 DIAGNOSIS — M179 Osteoarthritis of knee, unspecified: Secondary | ICD-10-CM | POA: Diagnosis not present

## 2017-10-05 DIAGNOSIS — E1140 Type 2 diabetes mellitus with diabetic neuropathy, unspecified: Secondary | ICD-10-CM | POA: Diagnosis not present

## 2017-10-05 DIAGNOSIS — M545 Low back pain: Secondary | ICD-10-CM | POA: Diagnosis not present

## 2017-11-16 DIAGNOSIS — M179 Osteoarthritis of knee, unspecified: Secondary | ICD-10-CM | POA: Diagnosis not present

## 2017-11-16 DIAGNOSIS — E1140 Type 2 diabetes mellitus with diabetic neuropathy, unspecified: Secondary | ICD-10-CM | POA: Diagnosis not present

## 2017-11-16 DIAGNOSIS — I10 Essential (primary) hypertension: Secondary | ICD-10-CM | POA: Diagnosis not present

## 2018-01-16 DIAGNOSIS — I10 Essential (primary) hypertension: Secondary | ICD-10-CM | POA: Diagnosis not present

## 2018-01-16 DIAGNOSIS — Z0000 Encounter for general adult medical examination without abnormal findings: Secondary | ICD-10-CM | POA: Diagnosis not present

## 2018-01-16 DIAGNOSIS — E1140 Type 2 diabetes mellitus with diabetic neuropathy, unspecified: Secondary | ICD-10-CM | POA: Diagnosis not present

## 2018-01-16 DIAGNOSIS — M179 Osteoarthritis of knee, unspecified: Secondary | ICD-10-CM | POA: Diagnosis not present

## 2018-01-16 DIAGNOSIS — E7849 Other hyperlipidemia: Secondary | ICD-10-CM | POA: Diagnosis not present

## 2018-04-19 DIAGNOSIS — E7849 Other hyperlipidemia: Secondary | ICD-10-CM | POA: Diagnosis not present

## 2018-04-19 DIAGNOSIS — I10 Essential (primary) hypertension: Secondary | ICD-10-CM | POA: Diagnosis not present

## 2018-04-19 DIAGNOSIS — E1140 Type 2 diabetes mellitus with diabetic neuropathy, unspecified: Secondary | ICD-10-CM | POA: Diagnosis not present

## 2018-04-19 DIAGNOSIS — M545 Low back pain: Secondary | ICD-10-CM | POA: Diagnosis not present

## 2018-08-09 ENCOUNTER — Encounter (HOSPITAL_COMMUNITY): Payer: Self-pay

## 2018-08-09 ENCOUNTER — Emergency Department (HOSPITAL_COMMUNITY): Payer: Medicare HMO

## 2018-08-09 ENCOUNTER — Other Ambulatory Visit: Payer: Self-pay

## 2018-08-09 ENCOUNTER — Observation Stay (HOSPITAL_COMMUNITY): Payer: Medicare HMO

## 2018-08-09 ENCOUNTER — Observation Stay (HOSPITAL_COMMUNITY)
Admission: EM | Admit: 2018-08-09 | Discharge: 2018-08-10 | Disposition: A | Discharge status: Home / Self Care | Payer: Medicare HMO | Attending: Internal Medicine | Admitting: Internal Medicine

## 2018-08-09 DIAGNOSIS — E11649 Type 2 diabetes mellitus with hypoglycemia without coma: Secondary | ICD-10-CM | POA: Diagnosis not present

## 2018-08-09 DIAGNOSIS — N280 Ischemia and infarction of kidney: Secondary | ICD-10-CM | POA: Diagnosis present

## 2018-08-09 DIAGNOSIS — N179 Acute kidney failure, unspecified: Secondary | ICD-10-CM

## 2018-08-09 DIAGNOSIS — M810 Age-related osteoporosis without current pathological fracture: Secondary | ICD-10-CM | POA: Diagnosis not present

## 2018-08-09 DIAGNOSIS — K56609 Unspecified intestinal obstruction, unspecified as to partial versus complete obstruction: Secondary | ICD-10-CM | POA: Diagnosis not present

## 2018-08-09 DIAGNOSIS — Z7984 Long term (current) use of oral hypoglycemic drugs: Secondary | ICD-10-CM | POA: Diagnosis not present

## 2018-08-09 DIAGNOSIS — K219 Gastro-esophageal reflux disease without esophagitis: Secondary | ICD-10-CM | POA: Diagnosis not present

## 2018-08-09 DIAGNOSIS — Z791 Long term (current) use of non-steroidal anti-inflammatories (NSAID): Secondary | ICD-10-CM | POA: Insufficient documentation

## 2018-08-09 DIAGNOSIS — E669 Obesity, unspecified: Secondary | ICD-10-CM | POA: Insufficient documentation

## 2018-08-09 DIAGNOSIS — Z7982 Long term (current) use of aspirin: Secondary | ICD-10-CM | POA: Insufficient documentation

## 2018-08-09 DIAGNOSIS — I10 Essential (primary) hypertension: Secondary | ICD-10-CM | POA: Diagnosis present

## 2018-08-09 DIAGNOSIS — K5900 Constipation, unspecified: Secondary | ICD-10-CM | POA: Insufficient documentation

## 2018-08-09 DIAGNOSIS — Z79899 Other long term (current) drug therapy: Secondary | ICD-10-CM | POA: Insufficient documentation

## 2018-08-09 DIAGNOSIS — E785 Hyperlipidemia, unspecified: Secondary | ICD-10-CM | POA: Diagnosis present

## 2018-08-09 DIAGNOSIS — R7989 Other specified abnormal findings of blood chemistry: Secondary | ICD-10-CM | POA: Diagnosis present

## 2018-08-09 DIAGNOSIS — I119 Hypertensive heart disease without heart failure: Secondary | ICD-10-CM | POA: Insufficient documentation

## 2018-08-09 DIAGNOSIS — Z20828 Contact with and (suspected) exposure to other viral communicable diseases: Secondary | ICD-10-CM | POA: Diagnosis not present

## 2018-08-09 DIAGNOSIS — R1084 Generalized abdominal pain: Secondary | ICD-10-CM | POA: Diagnosis not present

## 2018-08-09 DIAGNOSIS — E1169 Type 2 diabetes mellitus with other specified complication: Secondary | ICD-10-CM | POA: Diagnosis present

## 2018-08-09 DIAGNOSIS — R778 Other specified abnormalities of plasma proteins: Secondary | ICD-10-CM

## 2018-08-09 DIAGNOSIS — E119 Type 2 diabetes mellitus without complications: Secondary | ICD-10-CM | POA: Diagnosis present

## 2018-08-09 HISTORY — DX: Unspecified intestinal obstruction, unspecified as to partial versus complete obstruction: K56.609

## 2018-08-09 LAB — URINALYSIS, ROUTINE W REFLEX MICROSCOPIC
Bacteria, UA: NONE SEEN
Bilirubin Urine: NEGATIVE
Glucose, UA: 50 mg/dL — AB
Hgb urine dipstick: NEGATIVE
Ketones, ur: NEGATIVE mg/dL
Leukocytes,Ua: NEGATIVE
Nitrite: NEGATIVE
Protein, ur: 100 mg/dL — AB
Specific Gravity, Urine: 1.012 (ref 1.005–1.030)
pH: 9 — ABNORMAL HIGH (ref 5.0–8.0)

## 2018-08-09 LAB — COMPREHENSIVE METABOLIC PANEL
ALT: 9 U/L (ref 0–44)
AST: 21 U/L (ref 15–41)
Albumin: 4.1 g/dL (ref 3.5–5.0)
Alkaline Phosphatase: 38 U/L (ref 38–126)
Anion gap: 13 (ref 5–15)
BUN: 12 mg/dL (ref 8–23)
CO2: 27 mmol/L (ref 22–32)
Calcium: 10.1 mg/dL (ref 8.9–10.3)
Chloride: 92 mmol/L — ABNORMAL LOW (ref 98–111)
Creatinine, Ser: 1.18 mg/dL — ABNORMAL HIGH (ref 0.44–1.00)
GFR calc Af Amer: 52 mL/min — ABNORMAL LOW (ref >60)
GFR calc non Af Amer: 44 mL/min — ABNORMAL LOW (ref >60)
Glucose, Bld: 195 mg/dL — ABNORMAL HIGH (ref 70–99)
Potassium: 3.2 mmol/L — ABNORMAL LOW (ref 3.5–5.1)
Sodium: 132 mmol/L — ABNORMAL LOW (ref 135–145)
Total Bilirubin: 0.6 mg/dL (ref 0.3–1.2)
Total Protein: 8.1 g/dL (ref 6.5–8.1)

## 2018-08-09 LAB — CBC WITH DIFFERENTIAL/PLATELET
Abs Immature Granulocytes: 0.02 10*3/uL (ref 0.00–0.07)
Basophils Absolute: 0 10*3/uL (ref 0.0–0.1)
Basophils Relative: 0 %
Eosinophils Absolute: 0.4 10*3/uL (ref 0.0–0.5)
Eosinophils Relative: 4 %
HCT: 41.1 % (ref 36.0–46.0)
Hemoglobin: 13.5 g/dL (ref 12.0–15.0)
Immature Granulocytes: 0 %
Lymphocytes Relative: 24 %
Lymphs Abs: 2.4 10*3/uL (ref 0.7–4.0)
MCH: 27.5 pg (ref 26.0–34.0)
MCHC: 32.8 g/dL (ref 30.0–36.0)
MCV: 83.7 fL (ref 80.0–100.0)
Monocytes Absolute: 0.4 10*3/uL (ref 0.1–1.0)
Monocytes Relative: 4 %
Neutro Abs: 6.8 10*3/uL (ref 1.7–7.7)
Neutrophils Relative %: 68 %
Platelets: 310 10*3/uL (ref 150–400)
RBC: 4.91 MIL/uL (ref 3.87–5.11)
RDW: 14.7 % (ref 11.5–15.5)
WBC: 10 10*3/uL (ref 4.0–10.5)
nRBC: 0 % (ref 0.0–0.2)

## 2018-08-09 LAB — GLUCOSE, CAPILLARY
Glucose-Capillary: 111 mg/dL — ABNORMAL HIGH (ref 70–99)
Glucose-Capillary: 130 mg/dL — ABNORMAL HIGH (ref 70–99)

## 2018-08-09 LAB — HEMOGLOBIN A1C
Hgb A1c MFr Bld: 6.1 % — ABNORMAL HIGH (ref 4.8–5.6)
Mean Plasma Glucose: 128.37 mg/dL

## 2018-08-09 LAB — CBG MONITORING, ED: Glucose-Capillary: 185 mg/dL — ABNORMAL HIGH (ref 70–99)

## 2018-08-09 LAB — LIPASE, BLOOD: Lipase: 20 U/L (ref 11–51)

## 2018-08-09 LAB — SARS CORONAVIRUS 2 (TAT 6-24 HRS): SARS Coronavirus 2: NEGATIVE

## 2018-08-09 LAB — TROPONIN I (HIGH SENSITIVITY)
Troponin I (High Sensitivity): 37 ng/L — ABNORMAL HIGH (ref <18)
Troponin I (High Sensitivity): 31 ng/L — ABNORMAL HIGH (ref <18)

## 2018-08-09 MED ORDER — IOHEXOL 300 MG/ML  SOLN
100.0000 mL | Freq: Once | INTRAMUSCULAR | Status: AC | PRN
  Administered 2018-08-09: 11:00:00 100 mL via INTRAVENOUS

## 2018-08-09 MED ORDER — ONDANSETRON HCL 4 MG/2ML IJ SOLN
4.0000 mg | Freq: Once | INTRAMUSCULAR | Status: AC
  Administered 2018-08-09: 4 mg via INTRAVENOUS
  Filled 2018-08-09: qty 2

## 2018-08-09 MED ORDER — DIATRIZOATE MEGLUMINE & SODIUM 66-10 % PO SOLN
90.0000 mL | Freq: Once | ORAL | Status: AC
  Administered 2018-08-09: 15:00:00 90 mL via ORAL
  Filled 2018-08-09: qty 90

## 2018-08-09 MED ORDER — SODIUM CHLORIDE 0.9 % IV BOLUS
1000.0000 mL | Freq: Once | INTRAVENOUS | Status: AC
  Administered 2018-08-09: 13:00:00 1000 mL via INTRAVENOUS

## 2018-08-09 MED ORDER — INSULIN ASPART 100 UNIT/ML ~~LOC~~ SOLN: 0.0000 [IU] | Freq: Three times a day (TID) | SUBCUTANEOUS | Status: DC

## 2018-08-09 MED ORDER — LACTATED RINGERS IV SOLN
INTRAVENOUS | Status: DC
  Administered 2018-08-09 – 2018-08-10 (×2): via INTRAVENOUS

## 2018-08-09 MED ORDER — METOPROLOL TARTRATE 5 MG/5ML IV SOLN
5.0000 mg | Freq: Two times a day (BID) | INTRAVENOUS | Status: DC
  Administered 2018-08-09 – 2018-08-10 (×2): 5 mg via INTRAVENOUS
  Filled 2018-08-09 (×2): qty 5

## 2018-08-09 MED ORDER — ONDANSETRON HCL 4 MG/2ML IJ SOLN
4.0000 mg | Freq: Four times a day (QID) | INTRAMUSCULAR | Status: DC | PRN
  Administered 2018-08-09: 20:00:00 4 mg via INTRAVENOUS
  Filled 2018-08-09: qty 2

## 2018-08-09 MED ORDER — MORPHINE SULFATE (PF) 4 MG/ML IV SOLN
6.0000 mg | Freq: Once | INTRAVENOUS | Status: AC
  Administered 2018-08-09: 10:00:00 6 mg via INTRAVENOUS
  Filled 2018-08-09: qty 2

## 2018-08-09 MED ORDER — ACETAMINOPHEN 325 MG PO TABS: 650.0000 mg | ORAL_TABLET | Freq: Four times a day (QID) | ORAL | Status: DC | PRN

## 2018-08-09 MED ORDER — DIATRIZOATE MEGLUMINE & SODIUM 66-10 % PO SOLN: 90.0000 mL | Freq: Once | ORAL | Status: DC

## 2018-08-09 MED ORDER — ONDANSETRON HCL 4 MG PO TABS: 4.0000 mg | ORAL_TABLET | Freq: Four times a day (QID) | ORAL | Status: DC | PRN

## 2018-08-09 MED ORDER — MORPHINE SULFATE (PF) 2 MG/ML IV SOLN
2.0000 mg | INTRAVENOUS | Status: DC | PRN
  Administered 2018-08-09: 20:00:00 2 mg via INTRAVENOUS
  Filled 2018-08-09: qty 1

## 2018-08-09 MED ORDER — ACETAMINOPHEN 650 MG RE SUPP: 650.0000 mg | Freq: Four times a day (QID) | RECTAL | Status: DC | PRN

## 2018-08-09 MED ORDER — ASPIRIN EC 81 MG PO TBEC
81.0000 mg | DELAYED_RELEASE_TABLET | Freq: Every day | ORAL | Status: DC
  Administered 2018-08-10: 10:00:00 81 mg via ORAL
  Filled 2018-08-09: qty 1

## 2018-08-09 NOTE — ED Triage Notes (Signed)
Pt came from home via [facility], family reports that she has had epigastric pain since 0400. Pt denies vomiting but does endorse nausea & belching & feeling "weak" (per pt). Arrived to ED A/Ox4, EMS reported depression noted in v5 & v6 on her 12 lead.

## 2018-08-09 NOTE — Consult Note (Addendum)
Hospital Consult    Reason for Consult:  Right renal ischemia/infarct Referring Physician:  ED MRN #:  [id number]  History of Present Illness: This is a 78 y.o. female with history of hypertension, hyperlipidemia, diabetes that presented to the emergency room with abdominal pain since yesterday and is being evaluated for small bowel obstruction versus ileus and vascular surgery was asked to see for right renal infarct noted on CT.  Patient is in the ED with her daughter who provides most of the history through interpretation.  Ultimately patient states she has had upper midline abdominal pain since yesterday evening.  This has been with associated nausea and vomiting.  Pain is in the midline and not on one side or the other.   No previous abdominal surgery. Daughter states had a small BM that was not normal.    CT scan was obtained in the ED with decreased attenuation of most of the right kidney except for the superior pole concerning for right renal ischemia - bulky calcific plaque in both renal artery origins.  No evidence of stranding to suggest acute event around the right kidney.  Other pertinent findings include prominent fluid-filled loops of bowel in the lower abdomen with some free fluid on the left side.  Patient's daughter states no known history of kidney disease.  No previous renal artery interventions.  Past Medical History:  Diagnosis Date  . Diabetes mellitus without complication ([place])   . Hyperlipidemia   . Hypertension   . Osteoporosis     Past Surgical History:  Procedure Laterality Date  . CATARACT EXTRACTION W/ INTRAOCULAR LENS  IMPLANT, BILATERAL      No Known Allergies  Prior to Admission medications   Medication Sig Start Date End Date Taking? Authorizing Provider  alendronate (FOSAMAX) 70 MG tablet Take 70 mg by mouth once a week. Take with a full glass of water on an empty stomach.    [provider]  amLODipine (NORVASC) 10 MG tablet Take 10 mg by  mouth daily.    [provider]  atenolol (TENORMIN) 50 MG tablet Take 50 mg by mouth 2 (two) times daily.    [provider]  benazepril-hydrochlorthiazide (LOTENSIN HCT) 20-25 MG per tablet Take 1 tablet by mouth daily.    [provider]  benzonatate (TESSALON) 100 MG capsule Take 1 capsule (100 mg total) by mouth every 8 (eight) hours. 07/02/14   Waynetta Pean, PA-C  cyclobenzaprine (FLEXERIL) 10 MG tablet Take 10 mg by mouth at bedtime.    [provider]  diclofenac (VOLTAREN) 75 MG EC tablet Take 75 mg by mouth 2 (two) times daily.    [provider]  doxycycline (VIBRAMYCIN) 100 MG capsule Take 1 capsule (100 mg total) by mouth 2 (two) times daily. 07/02/14   Waynetta Pean, PA-C  metFORMIN (GLUCOPHAGE) 500 MG tablet Take by mouth 2 (two) times daily with a meal.    [provider]  rosuvastatin (CRESTOR) 10 MG tablet Take 10 mg by mouth daily.    [provider]    Social History   Socioeconomic History  . Marital status: Married    Spouse name: Not on file  . Number of children: Not on file  . Years of education: Not on file  . Highest education level: Not on file  Occupational History  . Not on file  Social Needs  . Financial resource strain: Not on file  . Food insecurity    Worry: Not on file  Inability: Not on file  . Transportation needs    Medical: Not on file    Non-medical: Not on file  Tobacco Use  . Smoking status: Never Smoker  . Smokeless tobacco: Never Used  Substance and Sexual Activity  . Alcohol use: No  . Drug use: No  . Sexual activity: Not on file  Lifestyle  . Physical activity    Days per week: Not on file    Minutes per session: Not on file  . Stress: Not on file  Relationships  . Social Herbalist on phone: Not on file    Gets together: Not on file    Attends religious service: Not on file    Active member of club or organization: Not on file    Attends meetings  of clubs or organizations: Not on file    Relationship status: Not on file  . Intimate partner violence    Fear of current or ex partner: Not on file    Emotionally abused: Not on file    Physically abused: Not on file    Forced sexual activity: Not on file  Other Topics Concern  . Not on file  Social History Narrative  . Not on file     Family History  Problem Relation Age of Onset  . Colon cancer Neg Hx     ROS: [x]  Positive   [ ]  Negative   [ ]  All sytems reviewed and are negative  Cardiovascular: []  chest pain/pressure []  palpitations []  SOB lying flat []  DOE []  pain in legs while walking []  pain in legs at rest []  pain in legs at night []  non-healing ulcers []  hx of DVT []  swelling in legs  Pulmonary: []  productive cough []  asthma/wheezing []  home O2  Neurologic: []  weakness in []  arms []  legs []  numbness in []  arms []  legs []  hx of CVA []  mini stroke [] difficulty speaking or slurred speech []  temporary loss of vision in one eye []  dizziness  Hematologic: []  hx of cancer []  bleeding problems []  problems with blood clotting easily  Endocrine:   []  diabetes []  thyroid disease  GI []  vomiting blood []  blood in stool [x] abdominal pain [x]  nausea/vomiting GU: []  CKD/renal failure []  HD--[]  M/W/F or []  T/T/S []  burning with urination []  blood in urine  Psychiatric: []  anxiety []  depression  Musculoskeletal: []  arthritis []  joint pain  Integumentary: []  rashes []  ulcers  Constitutional: []  fever []  chills   Physical Examination  Vitals:   08/09/18 1015 08/09/18 1030  BP: 139/70 (!) 145/69  Pulse:    Resp: 15 16  Temp:    SpO2:     There is no height or weight on file to calculate BMI.  General:  WDWN in NAD Gait: Not observed HENT: WNL, normocephalic Pulmonary: normal non-labored breathing, without Rales, rhonchi,  wheezing Cardiac: regular, without  Murmurs, rubs or gallops Abdomen: soft, NT/ND, no masses, no rebound or  guarding, no focal pain on one side or the other Skin: without rashes Vascular Exam/Pulses:  Right Left  Radial 2+ (normal) 2+ (normal)  Ulnar    Femoral 2+ (normal) 2+ (normal)  Popliteal    DP 1+ (weak) 1+ (weak)  PT     Extremities: without ischemic changes, without Gangrene , without cellulitis; without open wounds;  Musculoskeletal: no muscle wasting or atrophy  Neurologic: A&O X 3; Appropriate Affect ; SENSATION: normal; MOTOR FUNCTION:  moving all extremities equally. Speech is fluent/normal   CBC  Component Value Date/Time   WBC 10.0 08/09/2018 0912   RBC 4.91 08/09/2018 0912   HGB 13.5 08/09/2018 0912   HCT 41.1 08/09/2018 0912   PLT 310 08/09/2018 0912   MCV 83.7 08/09/2018 0912   MCH 27.5 08/09/2018 0912   MCHC 32.8 08/09/2018 0912   RDW 14.7 08/09/2018 0912   LYMPHSABS 2.4 08/09/2018 0912   MONOABS 0.4 08/09/2018 0912   EOSABS 0.4 08/09/2018 0912   BASOSABS 0.0 08/09/2018 0912    BMET    Component Value Date/Time   NA 132 (L) 08/09/2018 0912   K 3.2 (L) 08/09/2018 0912   CL 92 (L) 08/09/2018 0912   CO2 27 08/09/2018 0912   GLUCOSE 195 (H) 08/09/2018 0912   BUN 12 08/09/2018 0912   CREATININE 1.18 (H) 08/09/2018 0912   CALCIUM 10.1 08/09/2018 0912   GFRNONAA 44 (L) 08/09/2018 0912   GFRAA 52 (L) 08/09/2018 0912    COAGS: No results found for: INR, PROTIME   Non-Invasive Vascular Imaging:    CT abdomen pelvis with contrast today: On my review patient has 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  The renal arteries bilaterally have large calcific plaques at the ostium that makes evaluation of patency impossible on the CT.  I agree that the left kidney looks like normal contrast attenuation, whereas the right kidney has very poor attenuation except for the superior pole.   ASSESSMENT/PLAN: This is a 78 y.o. female presenting to the ED with small bowel obstruction versus ileus and vascular surgery was asked to evaluate  decreased attenuation within the right kidney on CT scan consistent with right renal ischemia.  I reviewed the CT scan with the patient's daughter in the ED.  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  It is unclear to me if her right renal ischemia is acute or chronic.  I guess presumably she could have had a low flow state from her nausea and vomiting and dehydration that potentially caused her right kidney to infarct in the setting of underlying chronic disease.  Given that she has been symptomatic since yesterday I think she has had a prolonged ischemic event to the kidney if this is indeed an acute event and would not favor any surgical intervention from our standpoint.  I think she needs supportive care with IV hydration etc.  Aspirin if able.  I will order a renal artery duplex to evaluate both renal arteries.  There is too much plaque on her CT scan and I cannot tell if they are actually patent and or what degree of stenosis.  Can trend her renal function.  Marty Heck, MD [facility] Office: [phone] Pager: [phone]

## 2018-08-09 NOTE — ED Notes (Signed)
Patient's daughter arrived to ED States that she was called at work about 4am by her 77 year old son stating that "grandma is not feeling well," and he gave her nitequil. Daughter left work, gave PT pepcid and called EMS Daughter also reports that PT's CBG was 60 about 6am, so she gave PT sugar  PT reports epigastric pain,  nausea, vomiting, constipation

## 2018-08-09 NOTE — Plan of Care (Signed)
?  Problem: Elimination: ?Goal: Will not experience complications related to urinary retention ?Outcome: Progressing ?  ?

## 2018-08-09 NOTE — ED Notes (Signed)
ED TO INPATIENT HANDOFF REPORT  ED Nurse Name and Phone #: Okie Jansson [phone] S Name/Age/Gender Mandy [name] 78 y.o. female Room/Bed: 012C/012C  Code Status   Code Status: Not on file  Home/SNF/Other Home Patient oriented to: self, place, time and situation Is this baseline? Yes   Triage Complete: Triage complete  Chief Complaint Abdominal Pain  Triage Note Pt came from home via [facility], family reports that she has had epigastric pain since 0400. Pt denies vomiting but does endorse nausea & belching & feeling "weak" (per pt). Arrived to ED A/Ox4, EMS reported depression noted in v5 & v6 on her 12 lead.    Allergies No Known Allergies  Level of Care/Admitting Diagnosis ED Disposition    ED Disposition Condition Comment   Admit  Hospital Area: [facility] [[id number]]  Level of Care: Med-Surg [16]  I expect the patient will be discharged within 24 hours: No (not a candidate for 5C-Observation unit)  Covid Evaluation: Asymptomatic Screening Protocol (No Symptoms)  Diagnosis: SBO (small bowel obstruction) ([place]) [177939]  Admitting Physician: Karmen Bongo [[id number]]  Attending Physician: Karmen Bongo [[id number]]  PT Class (Do Not Modify): Observation [104]  PT Acc Code (Do Not Modify): Observation [10022]       B Medical/Surgery History Past Medical History:  Diagnosis Date  . Diabetes mellitus without complication ([place])   . Hyperlipidemia   . Hypertension   . Osteoporosis    Past Surgical History:  Procedure Laterality Date  . CATARACT EXTRACTION W/ INTRAOCULAR LENS  IMPLANT, BILATERAL       A IV Location/Drains/Wounds Patient Lines/Drains/Airways Status   Active Line/Drains/Airways    Name:   Placement date:   Placement time:   Site:   Days:   Peripheral IV 08/09/18 Right Hand   08/09/18    0905    Hand   less than 1          Intake/Output Last 24 hours  Intake/Output Summary (Last 24 hours) at 08/09/2018 1611 Last data filed at 08/09/2018  1457 Gross per 24 hour  Intake 1000 ml  Output -  Net 1000 ml    Labs/Imaging Results for orders placed or performed during the hospital encounter of 08/09/18 (from the past 48 hour(s))  Urinalysis, Routine w reflex microscopic     Status: Abnormal   Collection Time: 08/09/18  9:06 AM  Result Value Ref Range   Color, Urine YELLOW YELLOW   APPearance CLEAR CLEAR   Specific Gravity, Urine 1.012 1.005 - 1.030   pH 9.0 (H) 5.0 - 8.0   Glucose, UA 50 (A) NEGATIVE mg/dL   Hgb urine dipstick NEGATIVE NEGATIVE   Bilirubin Urine NEGATIVE NEGATIVE   Ketones, ur NEGATIVE NEGATIVE mg/dL   Protein, ur 100 (A) NEGATIVE mg/dL   Nitrite NEGATIVE NEGATIVE   Leukocytes,Ua NEGATIVE NEGATIVE   RBC / HPF 6-10 0 - 5 RBC/hpf   WBC, UA 11-20 0 - 5 WBC/hpf   Bacteria, UA NONE SEEN NONE SEEN   Squamous Epithelial / LPF 0-5 0 - 5    Comment: Performed at [facility], [place] [address], [place], [place] [address]  CBC with Differential     Status: None   Collection Time: 08/09/18  9:12 AM  Result Value Ref Range   WBC 10.0 4.0 - 10.5 K/uL   RBC 4.91 3.87 - 5.11 MIL/uL   Hemoglobin 13.5 12.0 - 15.0 g/dL   HCT 41.1 36.0 - 46.0 %   MCV 83.7 80.0 - 100.0  fL   MCH 27.5 26.0 - 34.0 pg   MCHC 32.8 30.0 - 36.0 g/dL   RDW 14.7 11.5 - 15.5 %   Platelets 310 150 - 400 K/uL   nRBC 0.0 0.0 - 0.2 %   Neutrophils Relative % 68 %   Neutro Abs 6.8 1.7 - 7.7 K/uL   Lymphocytes Relative 24 %   Lymphs Abs 2.4 0.7 - 4.0 K/uL   Monocytes Relative 4 %   Monocytes Absolute 0.4 0.1 - 1.0 K/uL   Eosinophils Relative 4 %   Eosinophils Absolute 0.4 0.0 - 0.5 K/uL   Basophils Relative 0 %   Basophils Absolute 0.0 0.0 - 0.1 K/uL   Immature Granulocytes 0 %   Abs Immature Granulocytes 0.02 0.00 - 0.07 K/uL    Comment: Performed at [facility] [address] [place], [place] [address]  Comprehensive metabolic panel     Status: Abnormal   Collection Time: 08/09/18  9:12 AM  Result Value Ref Range   Sodium  132 (L) 135 - 145 mmol/L   Potassium 3.2 (L) 3.5 - 5.1 mmol/L   Chloride 92 (L) 98 - 111 mmol/L   CO2 27 22 - 32 mmol/L   Glucose, Bld 195 (H) 70 - 99 mg/dL   BUN 12 8 - 23 mg/dL   Creatinine, Ser 1.18 (H) 0.44 - 1.00 mg/dL   Calcium 10.1 8.9 - 10.3 mg/dL   Total Protein 8.1 6.5 - 8.1 g/dL   Albumin 4.1 3.5 - 5.0 g/dL   AST 21 15 - 41 U/L   ALT 9 0 - 44 U/L   Alkaline Phosphatase 38 38 - 126 U/L   Total Bilirubin 0.6 0.3 - 1.2 mg/dL   GFR calc non Af Amer 44 (L) >60 mL/min   GFR calc Af Amer 52 (L) >60 mL/min   Anion gap 13 5 - 15    Comment: Performed at [facility] [address], [place], [place] [address]  Lipase, blood     Status: None   Collection Time: 08/09/18  9:12 AM  Result Value Ref Range   Lipase 20 11 - 51 U/L    Comment: Performed at [facility] [address], [place], [place] [address]  Troponin I (High Sensitivity)     Status: Abnormal   Collection Time: 08/09/18  9:12 AM  Result Value Ref Range   Troponin I (High Sensitivity) 37 (H) <18 ng/L    Comment: (NOTE) Elevated high sensitivity troponin I (hsTnI) values and significant  changes across serial measurements may suggest ACS but many other  chronic and acute conditions are known to elevate hsTnI results.  Refer to the "Links" section for chest pain algorithms and additional  guidance. Performed at [facility], [place] [address], [place], [place] [address]   CBG monitoring, ED     Status: Abnormal   Collection Time: 08/09/18  9:20 AM  Result Value Ref Range   Glucose-Capillary 185 (H) 70 - 99 mg/dL  Troponin I (High Sensitivity)     Status: Abnormal   Collection Time: 08/09/18 11:07 AM  Result Value Ref Range   Troponin I (High Sensitivity) 31 (H) <18 ng/L    Comment: (NOTE) Elevated high sensitivity troponin I (hsTnI) values and significant  changes across serial measurements may suggest ACS but many other  chronic and acute conditions are known to elevate hsTnI results.  Refer  to the "Links" section for chest pain algorithms and  additional  guidance. Performed at [facility], [place] [address] [place], [place] [address]    Ct Abdomen Pelvis W Contrast  Result Date: 08/09/2018 CLINICAL DATA:  Right upper quadrant pain.  Cholecystitis suspected. EXAM: CT ABDOMEN AND PELVIS WITH CONTRAST TECHNIQUE: Multidetector CT imaging of the abdomen and pelvis was performed using the standard protocol following bolus administration of intravenous contrast. CONTRAST:  119mL OMNIPAQUE IOHEXOL 300 MG/ML  SOLN COMPARISON:  None. FINDINGS: Lower chest: Cardiomegaly.  The lower chest is otherwise normal. Hepatobiliary: No focal liver abnormality is seen. No gallstones, gallbladder wall thickening, or biliary dilatation. Pancreas: Unremarkable. No pancreatic ductal dilatation or surrounding inflammatory changes. Spleen: Normal in size without focal abnormality. Adrenals/Urinary Tract: Adrenal glands are normal. There is decreased attenuation throughout most of the right kidney with a small region of normal attenuation at the superior pole. No mass or stone identified in either kidney. No hydronephrosis or ureterectasis. No ureteral stones. The bladder is normal. On delayed images, the superior pole of the right kidney does partially washout with excretion into the upper pole collecting system. There is a paucity of excretion more inferiorly in the right kidney. There is normal excretion on the left. There is not significant perinephric stranding on the right. Stomach/Bowel: The stomach is normal. The duodenum and proximal jejunum are normal. There are multiple prominent loops of small bowel involving the distal half of the jejunum and the proximal half of the ileum. These loops of small bowel are dilated out of proportion to the remainder of the small bowel but do not reach 3 cm in caliber. Two potential transition points are identified. There is a potential transition point in the left pelvis on  axial image 60 and coronal image 51. There is fecal material in the small bowel loop just proximal to this transition point. There is a second potential transition point in the right pelvis on coronal mid image 49 and axial image 59. The colon is unremarkable. The appendix is normal in appearance. Vascular/Lymphatic: Atherosclerotic changes are seen in the nonaneurysmal aorta. The SMA is well opacified. There is dense atherosclerotic change at the origin of the right renal artery. An accessory right renal artery supplying the upper pole is not definitely seen. Atherosclerotic changes involve the iliac and femoral vessels as well. The right renal vein is normal in appearance. Reproductive: A fibroid is seen off the right uterine fundus. Other: Free fluid seen in the pelvis. Free fluid also extends from the left upper quadrant down the pericolic gutter on the left. There is a paucity of free fluid on the right. Musculoskeletal: No acute or significant osseous findings. IMPRESSION: 1. Decreased attenuation in most of the right kidney relative to the superior most aspect of the right kidney and the entire left kidney is most consistent with right renal ischemia. The chronicity of this finding is unclear. The right upper quadrant pain suggests the finding may be acute. However, the lack of adjacent stranding is unusual in the acute setting. Recommend clinical correlation. 2. There are prominent fluid-filled loops of bowel in the lower abdomen and pelvis with some regions of fecal material in the small bowel and 2 potential transition points. While these loops measure less than 3 cm, they are clearly dilated compared to the remainder of more proximal and distal small bowel. The findings are concerning for an early developing small-bowel obstruction. Ileus is a possibility. Recommend clinical correlation and close follow-up. 3. The free fluid in the left side of the  abdomen and in the pelvis is likely secondary to the  bowel abnormality. 4. No other acute abnormalities. Electronically Signed   By: Dorise Bullion III M.D   On: 08/09/2018 11:56   Dg Chest Portable 1 View  Result Date: 08/09/2018 CLINICAL DATA:  Weakness.  Epigastric pain.  Diabetes. EXAM: PORTABLE CHEST 1 VIEW COMPARISON:  July 02, 2014 FINDINGS: Stable cardiomegaly. The hila and mediastinum are unremarkable given portable technique. Mild atelectasis in the bases. No suspicious infiltrates. No overt edema. IMPRESSION: Mild bibasilar atelectasis.  Stable cardiomegaly.  No other changes. Electronically Signed   By: Dorise Bullion III M.D   On: 08/09/2018 09:44   Dg Abd Portable 1v-small Bowel Obstruction Protocol-initial, 8 Hr Delay  Result Date: 08/09/2018 CLINICAL DATA:  Small-bowel obstruction EXAM: PORTABLE ABDOMEN - 1 VIEW COMPARISON:  Same-day CT FINDINGS: Multiple dilated loops of small bowel within the mid abdomen measuring up to 3.1 cm in diameter. No gross free intraperitoneal air. Excreted contrast within the renal collecting systems and urinary bladder. Altered level lumbar spondylosis. IMPRESSION: Findings of small bowel obstruction. Electronically Signed   By: Davina Poke M.D.   On: 08/09/2018 14:24    Pending Labs Unresulted Labs (From admission, onward)    Start     Ordered   08/09/18 1226  SARS CORONAVIRUS 2 Nasal Swab Aptima Multi Swab  (Asymptomatic/Tier 2 Patients Labs)  Once,   STAT    Question Answer Comment  Is this test for diagnosis or screening Screening   Symptomatic for COVID-19 as defined by CDC No   Hospitalized for COVID-19 No   Admitted to ICU for COVID-19 No   Previously tested for COVID-19 No   Resident in a congregate (group) care setting No   Employed in healthcare setting No   Pregnant No      08/09/18 1225          Vitals/Pain Today's Vitals   08/09/18 0954 08/09/18 1000 08/09/18 1015 08/09/18 1030  BP:  136/73 139/70 (!) 145/69  Pulse:      Resp:  20 15 16   Temp: 98.4 F (36.9 C)      TempSrc: Rectal     SpO2:      PainSc:        Isolation Precautions No active isolations  Medications Medications  morphine 4 MG/ML injection 6 mg (6 mg Intravenous Given 08/09/18 0931)  ondansetron (ZOFRAN) injection 4 mg (4 mg Intravenous Given 08/09/18 0931)  iohexol (OMNIPAQUE) 300 MG/ML solution 100 mL (100 mLs Intravenous Contrast Given 08/09/18 1112)  sodium chloride 0.9 % bolus 1,000 mL (0 mLs Intravenous Stopped 08/09/18 1457)  diatrizoate meglumine-sodium (GASTROGRAFIN) 66-10 % solution 90 mL (90 mLs Oral Given 08/09/18 1456)    Mobility walks Low fall risk   Focused Assessments gastrointestinal   R Recommendations: See Admitting Provider Note  Report given to:   Additional Notes:  Daughter at bedside, pt does not speak english

## 2018-08-09 NOTE — Consult Note (Signed)
[facility] Surgery Consult Note  Mandy [name] October 10, 1940  [id number].    Requesting MD: Domenic Moras PA Chief Complaint/Reason for Consult: SBO  HPI:  Mandy [name] is a 78yo female PMH HTN, HLD, and DM, who presented to [facility] earlier today complaining of abdominal pain. Patient's daughter at bedside who helped with translation. States that she started having upper abdominal pain last night. Pain was constant and severe, and associated with multiple episodes of nausea and vomiting. She also reports some pain in her right back. Unable to tolerate anything to eat/drink. States that she had a very small BM this morning, but has not had a normal BM in about 2 days despite taking laxatives. Prior to this week she typically had 1 BM daily. ED workup included CT scan which shows prominent fluid-filled loops of bowel in the lower abdomen and pelvis with some regions of fecal material in the small bowel and 2 potential transition points, possible early SBO; decreased attenuation in most of the right kidney suspicious for right renal ischemia. WBC 10. BUN 12, Cr 1.18. Afebrile.   Abdominal surgical history: none  ROS: Review of Systems  Constitutional: Negative.   HENT: Negative.   Eyes: Negative.   Respiratory: Negative.   Cardiovascular: Negative.   Gastrointestinal: Positive for abdominal pain, constipation, nausea and vomiting. Negative for diarrhea.  Genitourinary: Negative.   Musculoskeletal: Positive for back pain.  Skin: Negative.   Neurological: Negative.    All systems reviewed and otherwise negative except for as above  Family History  Problem Relation Age of Onset  . Colon cancer Neg Hx     Past Medical History:  Diagnosis Date  . Diabetes mellitus without complication ([place])   . Hyperlipidemia   . Hypertension   . Osteoporosis     Past Surgical History:  Procedure Laterality Date  . CATARACT EXTRACTION W/ INTRAOCULAR LENS  IMPLANT, BILATERAL      Social History:   reports that she has never smoked. She has never used smokeless tobacco. She reports that she does not drink alcohol or use drugs.  Allergies: No Known Allergies  (Not in a hospital admission)   Prior to Admission medications   Medication Sig Start Date End Date Taking? Authorizing Provider  alendronate (FOSAMAX) 70 MG tablet Take 70 mg by mouth once a week. Take with a full glass of water on an empty stomach.    [provider]  amLODipine (NORVASC) 10 MG tablet Take 10 mg by mouth daily.    [provider]  atenolol (TENORMIN) 50 MG tablet Take 50 mg by mouth 2 (two) times daily.    [provider]  benazepril-hydrochlorthiazide (LOTENSIN HCT) 20-25 MG per tablet Take 1 tablet by mouth daily.    [provider]  benzonatate (TESSALON) 100 MG capsule Take 1 capsule (100 mg total) by mouth every 8 (eight) hours. 07/02/14   Waynetta Pean, PA-C  cyclobenzaprine (FLEXERIL) 10 MG tablet Take 10 mg by mouth at bedtime.    [provider]  diclofenac (VOLTAREN) 75 MG EC tablet Take 75 mg by mouth 2 (two) times daily.    [provider]  doxycycline (VIBRAMYCIN) 100 MG capsule Take 1 capsule (100 mg total) by mouth 2 (two) times daily. 07/02/14   Waynetta Pean, PA-C  metFORMIN (GLUCOPHAGE) 500 MG tablet Take by mouth 2 (two) times daily with a meal.    [provider]  rosuvastatin (CRESTOR) 10 MG tablet Take 10 mg by mouth daily.  [provider]    Blood pressure (!) 145/69, pulse 91, temperature 98.4 F (36.9 C), temperature source Rectal, resp. rate 16, SpO2 93 %. Physical Exam: General: pleasant, WD/WN female who is laying in bed in NAD HEENT: head is normocephalic, atraumatic.  Sclera are noninjected.  Pupils equal and round.  Ears and nose without any masses or lesions.  Mouth is pink and moist. Dentition fair Heart: regular, rate, and rhythm.  No obvious murmurs, gallops, or rubs noted.  Palpable pedal pulses  bilaterally, 1+ DP pulses bilaterally Lungs: CTAB, no wheezes, rhonchi, or rales noted.  Respiratory effort nonlabored Abd: soft, mild distension, hypoactive BS, no masses, hernias, or organomegaly. nontender abdomen MS: all 4 extremities are symmetrical with no cyanosis, clubbing, or edema. Skin: warm and dry with no masses, lesions, or rashes Psych: A&Ox3 with an appropriate affect. Neuro: cranial nerves grossly intact, extremity CSM intact bilaterally, normal speech  Results for orders placed or performed during the hospital encounter of 08/09/18 (from the past 48 hour(s))  Urinalysis, Routine w reflex microscopic     Status: Abnormal   Collection Time: 08/09/18  9:06 AM  Result Value Ref Range   Color, Urine YELLOW YELLOW   APPearance CLEAR CLEAR   Specific Gravity, Urine 1.012 1.005 - 1.030   pH 9.0 (H) 5.0 - 8.0   Glucose, UA 50 (A) NEGATIVE mg/dL   Hgb urine dipstick NEGATIVE NEGATIVE   Bilirubin Urine NEGATIVE NEGATIVE   Ketones, ur NEGATIVE NEGATIVE mg/dL   Protein, ur 100 (A) NEGATIVE mg/dL   Nitrite NEGATIVE NEGATIVE   Leukocytes,Ua NEGATIVE NEGATIVE   RBC / HPF 6-10 0 - 5 RBC/hpf   WBC, UA 11-20 0 - 5 WBC/hpf   Bacteria, UA NONE SEEN NONE SEEN   Squamous Epithelial / LPF 0-5 0 - 5    Comment: Performed at [facility], [place] [address] [place], [place] [address]  CBC with Differential     Status: None   Collection Time: 08/09/18  9:12 AM  Result Value Ref Range   WBC 10.0 4.0 - 10.5 K/uL   RBC 4.91 3.87 - 5.11 MIL/uL   Hemoglobin 13.5 12.0 - 15.0 g/dL   HCT 41.1 36.0 - 46.0 %   MCV 83.7 80.0 - 100.0 fL   MCH 27.5 26.0 - 34.0 pg   MCHC 32.8 30.0 - 36.0 g/dL   RDW 14.7 11.5 - 15.5 %   Platelets 310 150 - 400 K/uL   nRBC 0.0 0.0 - 0.2 %   Neutrophils Relative % 68 %   Neutro Abs 6.8 1.7 - 7.7 K/uL   Lymphocytes Relative 24 %   Lymphs Abs 2.4 0.7 - 4.0 K/uL   Monocytes Relative 4 %   Monocytes Absolute 0.4 0.1 - 1.0 K/uL   Eosinophils Relative 4 %    Eosinophils Absolute 0.4 0.0 - 0.5 K/uL   Basophils Relative 0 %   Basophils Absolute 0.0 0.0 - 0.1 K/uL   Immature Granulocytes 0 %   Abs Immature Granulocytes 0.02 0.00 - 0.07 K/uL    Comment: Performed at [facility], [address], [place], [place] [address]  Comprehensive metabolic panel     Status: Abnormal   Collection Time: 08/09/18  9:12 AM  Result Value Ref Range   Sodium 132 (L) 135 - 145 mmol/L   Potassium 3.2 (L) 3.5 - 5.1 mmol/L   Chloride 92 (L) 98 - 111 mmol/L   CO2 27 22 - 32 mmol/L  Glucose, Bld 195 (H) 70 - 99 mg/dL   BUN 12 8 - 23 mg/dL   Creatinine, Ser 1.18 (H) 0.44 - 1.00 mg/dL   Calcium 10.1 8.9 - 10.3 mg/dL   Total Protein 8.1 6.5 - 8.1 g/dL   Albumin 4.1 3.5 - 5.0 g/dL   AST 21 15 - 41 U/L   ALT 9 0 - 44 U/L   Alkaline Phosphatase 38 38 - 126 U/L   Total Bilirubin 0.6 0.3 - 1.2 mg/dL   GFR calc non Af Amer 44 (L) >60 mL/min   GFR calc Af Amer 52 (L) >60 mL/min   Anion gap 13 5 - 15    Comment: Performed at [facility] [address], [place], [place] [address]  Lipase, blood     Status: None   Collection Time: 08/09/18  9:12 AM  Result Value Ref Range   Lipase 20 11 - 51 U/L    Comment: Performed at [facility] [address] [place], [place] [address]  Troponin I (High Sensitivity)     Status: Abnormal   Collection Time: 08/09/18  9:12 AM  Result Value Ref Range   Troponin I (High Sensitivity) 37 (H) <18 ng/L    Comment: (NOTE) Elevated high sensitivity troponin I (hsTnI) values and significant  changes across serial measurements may suggest ACS but many other  chronic and acute conditions are known to elevate hsTnI results.  Refer to the "Links" section for chest pain algorithms and additional  guidance. Performed at [facility], [place] [address] [place], [place] [address]   CBG monitoring, ED     Status: Abnormal   Collection Time: 08/09/18  9:20 AM  Result Value Ref Range   Glucose-Capillary 185 (H) 70 - 99  mg/dL  Troponin I (High Sensitivity)     Status: Abnormal   Collection Time: 08/09/18 11:07 AM  Result Value Ref Range   Troponin I (High Sensitivity) 31 (H) <18 ng/L    Comment: (NOTE) Elevated high sensitivity troponin I (hsTnI) values and significant  changes across serial measurements may suggest ACS but many other  chronic and acute conditions are known to elevate hsTnI results.  Refer to the "Links" section for chest pain algorithms and additional  guidance. Performed at [facility], [place] [address], [place], [place] [address]    Ct Abdomen Pelvis W Contrast  Result Date: 08/09/2018 CLINICAL DATA:  Right upper quadrant pain.  Cholecystitis suspected. EXAM: CT ABDOMEN AND PELVIS WITH CONTRAST TECHNIQUE: Multidetector CT imaging of the abdomen and pelvis was performed using the standard protocol following bolus administration of intravenous contrast. CONTRAST:  135mL OMNIPAQUE IOHEXOL 300 MG/ML  SOLN COMPARISON:  None. FINDINGS: Lower chest: Cardiomegaly.  The lower chest is otherwise normal. Hepatobiliary: No focal liver abnormality is seen. No gallstones, gallbladder wall thickening, or biliary dilatation. Pancreas: Unremarkable. No pancreatic ductal dilatation or surrounding inflammatory changes. Spleen: Normal in size without focal abnormality. Adrenals/Urinary Tract: Adrenal glands are normal. There is decreased attenuation throughout most of the right kidney with a small region of normal attenuation at the superior pole. No mass or stone identified in either kidney. No hydronephrosis or ureterectasis. No ureteral stones. The bladder is normal. On delayed images, the superior pole of the right kidney does partially washout with excretion into the upper pole collecting system. There is a paucity of excretion more inferiorly in the right kidney. There is normal excretion on the left. There is not significant  perinephric stranding on the right. Stomach/Bowel: The stomach is normal. The  duodenum and proximal jejunum are normal. There are multiple prominent loops of small bowel involving the distal half of the jejunum and the proximal half of the ileum. These loops of small bowel are dilated out of proportion to the remainder of the small bowel but do not reach 3 cm in caliber. Two potential transition points are identified. There is a potential transition point in the left pelvis on axial image 60 and coronal image 51. There is fecal material in the small bowel loop just proximal to this transition point. There is a second potential transition point in the right pelvis on coronal mid image 49 and axial image 59. The colon is unremarkable. The appendix is normal in appearance. Vascular/Lymphatic: Atherosclerotic changes are seen in the nonaneurysmal aorta. The SMA is well opacified. There is dense atherosclerotic change at the origin of the right renal artery. An accessory right renal artery supplying the upper pole is not definitely seen. Atherosclerotic changes involve the iliac and femoral vessels as well. The right renal vein is normal in appearance. Reproductive: A fibroid is seen off the right uterine fundus. Other: Free fluid seen in the pelvis. Free fluid also extends from the left upper quadrant down the pericolic gutter on the left. There is a paucity of free fluid on the right. Musculoskeletal: No acute or significant osseous findings. IMPRESSION: 1. Decreased attenuation in most of the right kidney relative to the superior most aspect of the right kidney and the entire left kidney is most consistent with right renal ischemia. The chronicity of this finding is unclear. The right upper quadrant pain suggests the finding may be acute. However, the lack of adjacent stranding is unusual in the acute setting. Recommend clinical correlation. 2. There are prominent fluid-filled loops of bowel in the lower abdomen and pelvis with some regions of fecal material in the small bowel and 2 potential  transition points. While these loops measure less than 3 cm, they are clearly dilated compared to the remainder of more proximal and distal small bowel. The findings are concerning for an early developing small-bowel obstruction. Ileus is a possibility. Recommend clinical correlation and close follow-up. 3. The free fluid in the left side of the abdomen and in the pelvis is likely secondary to the bowel abnormality. 4. No other acute abnormalities. Electronically Signed   By: Dorise Bullion III M.D   On: 08/09/2018 11:56   Dg Chest Portable 1 View  Result Date: 08/09/2018 CLINICAL DATA:  Weakness.  Epigastric pain.  Diabetes. EXAM: PORTABLE CHEST 1 VIEW COMPARISON:  July 02, 2014 FINDINGS: Stable cardiomegaly. The hila and mediastinum are unremarkable given portable technique. Mild atelectasis in the bases. No suspicious infiltrates. No overt edema. IMPRESSION: Mild bibasilar atelectasis.  Stable cardiomegaly.  No other changes. Electronically Signed   By: Dorise Bullion III M.D   On: 08/09/2018 09:44      Assessment/Plan HTN HLD DM ?Right renal ischemia - vascular surgery to see  pSBO vs Ileus Constipation - no prior h/o abdominal surgery - CT scan 8/6 shows prominent fluid-filled loops of bowel in the lower abdomen and pelvis with some regions of fecal material in the small bowel and 2 potential transition points, possible early SBO  ID - none VTE - SCDs, ok for chemical DVT prophylaxis from surgical standpoint FEN - IVF, NPO Foley - none Follow up - TBD  Plan - Recommend medical admission. Can hold off on placing  NG tube unless patient vomits. Will start small bowel protocol and have patient drink the gastrograffin. Give enema for constipation. Mobilize.  Vascular surgery to see for possible right renal ischemia. Will continue to follow. Covid test pending.  Wellington Hampshire, [facility] Surgery 08/09/2018, 2:03 PM Pager: [phone] Mon-Thurs 7:00 am-4:30 pm Fri 7:00  am -11:30 AM Sat-Sun 7:00 am-11:30 am

## 2018-08-09 NOTE — ED Notes (Signed)
Pt O2 sat @ 88% RA. Pt placed on 2L [place]. Pt 02 sat @ 95%.

## 2018-08-09 NOTE — ED Provider Notes (Signed)
[place] EMERGENCY DEPARTMENT Provider Note   CSN: [id number] Arrival date & time: 08/09/18  2353     History   Chief Complaint Chief Complaint  Patient presents with   Epigastric Pain   Weakness    HPI Mandy [name] is a 78 y.o. female.     The history is provided by the patient and a relative. The history is limited by a language barrier. A language interpreter was used.  Weakness    78 year old female with history of hypertension, diabetes, GERD brought here via EMS from home for evaluation of upper abdominal pain.  Patient spoke [place], history obtained through daughter who is at bedside.  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  This morning, her son notified to the daughter that patient is feeling bad.  Patient complaining of upper abdominal pain along with feeling nauseous, vomiting and generalized weakness.  Symptoms started since last night.  Pain is moderate in severity.  She reported feeling very bad.  Daughter mention this morning her blood sugar was 38, she was given sugar which improved her blood sugar.  Patient felt fine yesterday.  She is not insulin-dependent diabetes.  No recent sick contact.  No complaints of fever, chest pain, shortness of breath, productive cough, dysuria or rash.  She has had somewhat of a similar symptoms like this in the past but never this severe.  Past Medical History:  Diagnosis Date   Diabetes mellitus without complication ([place])    Hyperlipidemia    Hypertension    Osteoporosis     Patient Active Problem List   Diagnosis Date Noted   DIABETES MELLITUS, TYPE II 04/07/2008   HYPERLIPIDEMIA 04/07/2008   HYPERTENSION 04/07/2008   SORE THROAT 04/07/2008   GERD 04/07/2008    Past Surgical History:  Procedure Laterality Date   CATARACT EXTRACTION W/ INTRAOCULAR LENS  IMPLANT, BILATERAL       OB History   No obstetric history on file.      Home Medications    Prior to Admission medications     Medication Sig Start Date End Date Taking? Authorizing Provider  alendronate (FOSAMAX) 70 MG tablet Take 70 mg by mouth once a week. Take with a full glass of water on an empty stomach.    [provider]  amLODipine (NORVASC) 10 MG tablet Take 10 mg by mouth daily.    [provider]  atenolol (TENORMIN) 50 MG tablet Take 50 mg by mouth 2 (two) times daily.    [provider]  benazepril-hydrochlorthiazide (LOTENSIN HCT) 20-25 MG per tablet Take 1 tablet by mouth daily.    [provider]  benzonatate (TESSALON) 100 MG capsule Take 1 capsule (100 mg total) by mouth every 8 (eight) hours. 07/02/14   Waynetta Pean, PA-C  cyclobenzaprine (FLEXERIL) 10 MG tablet Take 10 mg by mouth at bedtime.    [provider]  diclofenac (VOLTAREN) 75 MG EC tablet Take 75 mg by mouth 2 (two) times daily.    [provider]  doxycycline (VIBRAMYCIN) 100 MG capsule Take 1 capsule (100 mg total) by mouth 2 (two) times daily. 07/02/14   Waynetta Pean, PA-C  metFORMIN (GLUCOPHAGE) 500 MG tablet Take by mouth 2 (two) times daily with a meal.    [provider]  rosuvastatin (CRESTOR) 10 MG tablet Take 10 mg by mouth daily.    [provider]    Family History Family History  Problem Relation Age of Onset   Colon cancer Neg  Hx     Social History Social History   Tobacco Use   Smoking status: Never Smoker   Smokeless tobacco: Never Used  Substance Use Topics   Alcohol use: No   Drug use: No     Allergies   Patient has no known allergies.   Review of Systems Review of Systems  Neurological: Positive for weakness.  All other systems reviewed and are negative.    Physical Exam Updated Vital Signs BP (!) 163/85    Pulse 91    Temp 98.4 F (36.9 C) (Rectal)    Resp (!) 23    SpO2 93%   Physical Exam Vitals signs and nursing note reviewed.  Constitutional:      Appearance: She is well-developed. She is obese.      Comments: Appears uncomfortable, dry heaving  HENT:     Head: Atraumatic.  Eyes:     Conjunctiva/sclera: Conjunctivae normal.  Neck:     Musculoskeletal: Neck supple.  Cardiovascular:     Rate and Rhythm: Normal rate and regular rhythm.     Pulses: Normal pulses.     Heart sounds: Normal heart sounds.  Pulmonary:     Effort: Pulmonary effort is normal.     Breath sounds: Rhonchi (Faint scattered rhonchi heard) present.  Abdominal:     Palpations: Abdomen is soft.     Tenderness: There is abdominal tenderness (Epigastric tenderness without guarding or rebound tenderness.  Negative Murphy sign, no pain at McBurney's point).  Musculoskeletal:     Comments: 5 out of 5 strength to all 4 extremities  Skin:    Findings: No rash.  Neurological:     Mental Status: She is alert. Mental status is at baseline.  Psychiatric:        Mood and Affect: Mood normal.      ED Treatments / Results  Labs (all labs ordered are listed, but only abnormal results are displayed) Labs Reviewed  COMPREHENSIVE METABOLIC PANEL - Abnormal; Notable for the following components:      Result Value   Sodium 132 (*)    Potassium 3.2 (*)    Chloride 92 (*)    Glucose, Bld 195 (*)    Creatinine, Ser 1.18 (*)    GFR calc non Af Amer 44 (*)    GFR calc Af Amer 52 (*)    All other components within normal limits  URINALYSIS, ROUTINE W REFLEX MICROSCOPIC - Abnormal; Notable for the following components:   pH 9.0 (*)    Glucose, UA 50 (*)    Protein, ur 100 (*)    All other components within normal limits  CBG MONITORING, ED - Abnormal; Notable for the following components:   Glucose-Capillary 185 (*)    All other components within normal limits  TROPONIN I (HIGH SENSITIVITY) - Abnormal; Notable for the following components:   Troponin I (High Sensitivity) 37 (*)    All other components within normal limits  TROPONIN I (HIGH SENSITIVITY) - Abnormal; Notable for the following components:   Troponin I (High  Sensitivity) 31 (*)    All other components within normal limits  SARS CORONAVIRUS 2  CBC WITH DIFFERENTIAL/PLATELET  LIPASE, BLOOD    EKG EKG Interpretation  Date/Time:  Thursday August 09 2018 10:25:06 EDT Ventricular Rate:  65 PR Interval:    QRS Duration: 79 QT Interval:  423 QTC Calculation: 440 R Axis:   62 Text Interpretation:  Sinus rhythm Abnormal R-wave progression, early transition No previous ECGs available  Confirmed by Gareth Morgan [phone]) on 08/09/2018 12:57:55 PM    Date: 08/09/2018  Rate: 65  Rhythm: normal sinus rhythm  QRS Axis: normal  Intervals: normal  ST/T Wave abnormalities: normal  Conduction Disutrbances: none  Narrative Interpretation:   Old EKG Reviewed: No significant changes noted     Radiology Ct Abdomen Pelvis W Contrast  Result Date: 08/09/2018 CLINICAL DATA:  Right upper quadrant pain.  Cholecystitis suspected. EXAM: CT ABDOMEN AND PELVIS WITH CONTRAST TECHNIQUE: Multidetector CT imaging of the abdomen and pelvis was performed using the standard protocol following bolus administration of intravenous contrast. CONTRAST:  190mL OMNIPAQUE IOHEXOL 300 MG/ML  SOLN COMPARISON:  None. FINDINGS: Lower chest: Cardiomegaly.  The lower chest is otherwise normal. Hepatobiliary: No focal liver abnormality is seen. No gallstones, gallbladder wall thickening, or biliary dilatation. Pancreas: Unremarkable. No pancreatic ductal dilatation or surrounding inflammatory changes. Spleen: Normal in size without focal abnormality. Adrenals/Urinary Tract: Adrenal glands are normal. There is decreased attenuation throughout most of the right kidney with a small region of normal attenuation at the superior pole. No mass or stone identified in either kidney. No hydronephrosis or ureterectasis. No ureteral stones. The bladder is normal. On delayed images, the superior pole of the right kidney does partially washout with excretion into the upper pole collecting system. There is  a paucity of excretion more inferiorly in the right kidney. There is normal excretion on the left. There is not significant perinephric stranding on the right. Stomach/Bowel: The stomach is normal. The duodenum and proximal jejunum are normal. There are multiple prominent loops of small bowel involving the distal half of the jejunum and the proximal half of the ileum. These loops of small bowel are dilated out of proportion to the remainder of the small bowel but do not reach 3 cm in caliber. Two potential transition points are identified. There is a potential transition point in the left pelvis on axial image 60 and coronal image 51. There is fecal material in the small bowel loop just proximal to this transition point. There is a second potential transition point in the right pelvis on coronal mid image 49 and axial image 59. The colon is unremarkable. The appendix is normal in appearance. Vascular/Lymphatic: Atherosclerotic changes are seen in the nonaneurysmal aorta. The SMA is well opacified. There is dense atherosclerotic change at the origin of the right renal artery. An accessory right renal artery supplying the upper pole is not definitely seen. Atherosclerotic changes involve the iliac and femoral vessels as well. The right renal vein is normal in appearance. Reproductive: A fibroid is seen off the right uterine fundus. Other: Free fluid seen in the pelvis. Free fluid also extends from the left upper quadrant down the pericolic gutter on the left. There is a paucity of free fluid on the right. Musculoskeletal: No acute or significant osseous findings. IMPRESSION: 1. Decreased attenuation in most of the right kidney relative to the superior most aspect of the right kidney and the entire left kidney is most consistent with right renal ischemia. The chronicity of this finding is unclear. The right upper quadrant pain suggests the finding may be acute. However, the lack of adjacent stranding is unusual in the  acute setting. Recommend clinical correlation. 2. There are prominent fluid-filled loops of bowel in the lower abdomen and pelvis with some regions of fecal material in the small bowel and 2 potential transition points. While these loops measure less than 3 cm, they are clearly dilated compared to the remainder  of more proximal and distal small bowel. The findings are concerning for an early developing small-bowel obstruction. Ileus is a possibility. Recommend clinical correlation and close follow-up. 3. The free fluid in the left side of the abdomen and in the pelvis is likely secondary to the bowel abnormality. 4. No other acute abnormalities. Electronically Signed   By: Dorise Bullion III M.D   On: 08/09/2018 11:56   Dg Chest Portable 1 View  Result Date: 08/09/2018 CLINICAL DATA:  Weakness.  Epigastric pain.  Diabetes. EXAM: PORTABLE CHEST 1 VIEW COMPARISON:  July 02, 2014 FINDINGS: Stable cardiomegaly. The hila and mediastinum are unremarkable given portable technique. Mild atelectasis in the bases. No suspicious infiltrates. No overt edema. IMPRESSION: Mild bibasilar atelectasis.  Stable cardiomegaly.  No other changes. Electronically Signed   By: Dorise Bullion III M.D   On: 08/09/2018 09:44    Procedures Procedures (including critical care time)  Medications Ordered in ED Medications  diatrizoate meglumine-sodium (GASTROGRAFIN) 66-10 % solution 90 mL (has no administration in time range)  morphine 4 MG/ML injection 6 mg (6 mg Intravenous Given 08/09/18 0931)  ondansetron (ZOFRAN) injection 4 mg (4 mg Intravenous Given 08/09/18 0931)  iohexol (OMNIPAQUE) 300 MG/ML solution 100 mL (100 mLs Intravenous Contrast Given 08/09/18 1112)  sodium chloride 0.9 % bolus 1,000 mL (1,000 mLs Intravenous New Bag/Given 08/09/18 1321)     Initial Impression / Assessment and Plan / ED Course  I have reviewed the triage vital signs and the nursing notes.  Pertinent labs & imaging results that were available  during my care of the patient were reviewed by me and considered in my medical decision making (see chart for details).        BP (!) 145/69    Pulse 91    Temp 98.4 F (36.9 C) (Rectal)    Resp 16    SpO2 93%    Final Clinical Impressions(s) / ED Diagnoses   Final diagnoses:  SBO (small bowel obstruction) ([facility])  Elevated troponin level    ED Discharge Orders    None     9:26 AM Patient presents with epigastric abdominal pain generalized weakness and feeling nauseous and vomiting.  She appears uncomfortable on exam.  She does have reproducible epigastric pain.  She does not have any symptoms to suggest COVID-19 at this time.  She initially was found to be hypoglycemic by daughter with a CBG of 38, on recheck it is 185 after she received a sugar pill at home.  Work-up initiated.  Patient was found to be hypertensive with initial blood pressure of 193/92.  This could be pain related.  12:52 PM Patient initially had an elevated troponin of 37.  On delta troponin, that value decreased to 31.  She has normal WBC.  She is mildly hypokalemic at a potassium of 3.2  1:42 PM Abdominal pelvis CT scan shows decreased attenuation in the right kidney concerning for potential right renal ischemia.  The chronicity of this finding is unclear.  I did discuss this with vascular surgeon, Dr. Carlis Abbott, will plan to obtain renal ultrasound as well as close monitoring of patient's renal function.  CT scan also demonstrates prominent fluid filled loop of bowel in the lower abdomen and pelvis that are dilated and concerning for early developing small bowel obstruction, ileus is a possibility.  Appreciate consultation from general surgery who recommended managed medicine admission and they will be available for consultation.  Care discussed with Dr. Billy Fischer.   1:57 PM Appreciate  consultation from [facility] Dr. Lorin Mercy who agrees to see and admit pt for further care.   Mandy [name] was evaluated in  Emergency Department on 08/09/2018 for the symptoms described in the history of present illness. She was evaluated in the context of the global COVID-19 pandemic, which necessitated consideration that the patient might be at risk for infection with the SARS-CoV-2 virus that causes COVID-19. Institutional protocols and algorithms that pertain to the evaluation of patients at risk for COVID-19 are in a state of rapid change based on information released by regulatory bodies including the CDC and federal and state organizations. These policies and algorithms were followed during the patient's care in the ED.    Domenic Moras, PA-C 08/09/18 1407    Gareth Morgan, MD 08/11/18 1151

## 2018-08-09 NOTE — H&P (Signed)
History and Physical    Mandy [name] JTT:[id number] DOB: Feb 17, 1940 DOA: 08/09/2018  PCP: [name], MD Consultants:  None Patient coming from:  Home - lives with daughter and her family; NOK: Daughter, [phone]  Chief Complaint: Abdominal pain  HPI: Mandy [name] is a 78 y.o. female with medical history significant of HTN; HLD; and DM presenting with abdominal pain.  Her daughter was at work.  Her grandson called about 0400 to report that the patient was vomiting.  She complained of severe abdominal pain.  Symptoms started about 8PM last night.  No diarrhea, minimally able to have BM.  Last normal BM was 1-2 days ago.  She has been taking laxatives due to chronic constipation.   Her pain is better now.  Last emesis was about 6-7 this AM.  No fever.  She has been able to void without difficulty.  She felt well yesterday prior to 8pm, just mild abdominal pain.  She has had some problems with GERD and was started on omeprazole - does not take daily - maybe a year ago.   ED Course:   SBO, maybe ileus.  Abdominal pain, n/v, weakness.  CT with SBO.  Not actively vomiting. CT also with ?R renal ischemia, vascular surgery Dr. Carlis Abbott to perform [place] and monitor.  Troponin increased but stable - cardiology recommends trend and consult if needed.  No NG tube not needed for now, as per surgery, who will see the patient.  Review of Systems: As per HPI; otherwise review of systems reviewed and negative.  Limited by language barrier  Ambulatory Status:  Ambulates without assistance  Past Medical History:  Diagnosis Date   Diabetes mellitus without complication (HCC)    Hyperlipidemia    Hypertension    Osteoporosis    SBO (small bowel obstruction) ([place]) 08/09/2018    Past Surgical History:  Procedure Laterality Date   CATARACT EXTRACTION W/ INTRAOCULAR LENS  IMPLANT, BILATERAL     COLONOSCOPY      Social History   Socioeconomic History   Marital status: Married    Spouse name:  Not on file   Number of children: Not on file   Years of education: Not on file   Highest education level: Not on file  Occupational History   Not on file  Social Needs   Financial resource strain: Not on file   Food insecurity    Worry: Not on file    Inability: Not on file   Transportation needs    Medical: Not on file    Non-medical: Not on file  Tobacco Use   Smoking status: Never Smoker   Smokeless tobacco: Never Used  Substance and Sexual Activity   Alcohol use: No   Drug use: No   Sexual activity: Not on file  Lifestyle   Physical activity    Days per week: Not on file    Minutes per session: Not on file   Stress: Not on file  Relationships   Social connections    Talks on phone: Not on file    Gets together: Not on file    Attends religious service: Not on file    Active member of club or organization: Not on file    Attends meetings of clubs or organizations: Not on file    Relationship status: Not on file   Intimate partner violence    Fear of current or ex partner: Not on file    Emotionally abused: Not on file    Physically  abused: Not on file    Forced sexual activity: Not on file  Other Topics Concern   Not on file  Social History Narrative   Patient is from [place]    No Known Allergies  Family History  Problem Relation Age of Onset   Kidney disease Son    Colon cancer Neg Hx     Prior to Admission medications   Medication Sig Start Date End Date Taking? Authorizing Provider  alendronate (FOSAMAX) 70 MG tablet Take 70 mg by mouth once a week. Take with a full glass of water on an empty stomach.    [provider]  amLODipine (NORVASC) 10 MG tablet Take 10 mg by mouth daily.    [provider]  atenolol (TENORMIN) 50 MG tablet Take 50 mg by mouth 2 (two) times daily.    [provider]  benazepril-hydrochlorthiazide (LOTENSIN HCT) 20-25 MG per tablet Take 1 tablet by mouth daily.    [provider]  benzonatate (TESSALON) 100 MG capsule Take 1 capsule (100 mg total) by mouth every 8 (eight) hours. 07/02/14   Waynetta Pean, PA-C  cyclobenzaprine (FLEXERIL) 10 MG tablet Take 10 mg by mouth at bedtime.    [provider]  diclofenac (VOLTAREN) 75 MG EC tablet Take 75 mg by mouth 2 (two) times daily.    [provider]  doxycycline (VIBRAMYCIN) 100 MG capsule Take 1 capsule (100 mg total) by mouth 2 (two) times daily. 07/02/14   Waynetta Pean, PA-C  metFORMIN (GLUCOPHAGE) 500 MG tablet Take by mouth 2 (two) times daily with a meal.    [provider]  rosuvastatin (CRESTOR) 10 MG tablet Take 10 mg by mouth daily.    [provider]    Physical Exam: Vitals:   08/09/18 1015 08/09/18 1030 08/09/18 1600 08/09/18 1717  BP: 139/70 (!) 145/69 (!) 166/78 (!) 149/83  Pulse:   64 65  Resp: 15 16 17 17   Temp:    99.6 F (37.6 C)  TempSrc:    Oral  SpO2:   95% 100%  Weight:    77.1 kg      General:  Appears calm and comfortable and is NAD; speaks [place]  Eyes:  PERRL, EOMI, normal lids, iris  ENT:  grossly normal hearing, lips & tongue, mmm  Neck:  no LAD, masses or thyromegaly  Cardiovascular:  RRR, no m/r/g. No LE edema.   Respiratory:   CTA bilaterally with no wheezes/rales/rhonchi.  Normal respiratory effort.  Abdomen:  soft, epigastric TTP, ND, hypoactive but present BS  Skin:  no rash or induration seen on limited exam  Musculoskeletal:  grossly normal tone BUE/BLE, good ROM, no bony abnormality  Psychiatric:  grossly normal mood and affect, speech fluent and appropriate, AOx3  Neurologic:  CN 2-12 grossly intact, moves all extremities in coordinated fashion    Radiological Exams on Admission: Ct Abdomen Pelvis W Contrast  Result Date: 08/09/2018 CLINICAL DATA:  Right upper quadrant pain.  Cholecystitis suspected. EXAM: CT ABDOMEN AND PELVIS WITH CONTRAST TECHNIQUE: Multidetector CT imaging of the abdomen and pelvis  was performed using the standard protocol following bolus administration of intravenous contrast. CONTRAST:  151mL OMNIPAQUE IOHEXOL 300 MG/ML  SOLN COMPARISON:  None. FINDINGS: Lower chest: Cardiomegaly.  The lower chest is otherwise normal. Hepatobiliary: No focal liver abnormality is seen. No gallstones, gallbladder wall thickening, or biliary dilatation. Pancreas: Unremarkable. No pancreatic ductal dilatation or surrounding inflammatory changes. Spleen: Normal in size without focal abnormality. Adrenals/Urinary  Tract: Adrenal glands are normal. There is decreased attenuation throughout most of the right kidney with a small region of normal attenuation at the superior pole. No mass or stone identified in either kidney. No hydronephrosis or ureterectasis. No ureteral stones. The bladder is normal. On delayed images, the superior pole of the right kidney does partially washout with excretion into the upper pole collecting system. There is a paucity of excretion more inferiorly in the right kidney. There is normal excretion on the left. There is not significant perinephric stranding on the right. Stomach/Bowel: The stomach is normal. The duodenum and proximal jejunum are normal. There are multiple prominent loops of small bowel involving the distal half of the jejunum and the proximal half of the ileum. These loops of small bowel are dilated out of proportion to the remainder of the small bowel but do not reach 3 cm in caliber. Two potential transition points are identified. There is a potential transition point in the left pelvis on axial image 60 and coronal image 51. There is fecal material in the small bowel loop just proximal to this transition point. There is a second potential transition point in the right pelvis on coronal mid image 49 and axial image 59. The colon is unremarkable. The appendix is normal in appearance. Vascular/Lymphatic: Atherosclerotic changes are seen in the nonaneurysmal aorta. The SMA  is well opacified. There is dense atherosclerotic change at the origin of the right renal artery. An accessory right renal artery supplying the upper pole is not definitely seen. Atherosclerotic changes involve the iliac and femoral vessels as well. The right renal vein is normal in appearance. Reproductive: A fibroid is seen off the right uterine fundus. Other: Free fluid seen in the pelvis. Free fluid also extends from the left upper quadrant down the pericolic gutter on the left. There is a paucity of free fluid on the right. Musculoskeletal: No acute or significant osseous findings. IMPRESSION: 1. Decreased attenuation in most of the right kidney relative to the superior most aspect of the right kidney and the entire left kidney is most consistent with right renal ischemia. The chronicity of this finding is unclear. The right upper quadrant pain suggests the finding may be acute. However, the lack of adjacent stranding is unusual in the acute setting. Recommend clinical correlation. 2. There are prominent fluid-filled loops of bowel in the lower abdomen and pelvis with some regions of fecal material in the small bowel and 2 potential transition points. While these loops measure less than 3 cm, they are clearly dilated compared to the remainder of more proximal and distal small bowel. The findings are concerning for an early developing small-bowel obstruction. Ileus is a possibility. Recommend clinical correlation and close follow-up. 3. The free fluid in the left side of the abdomen and in the pelvis is likely secondary to the bowel abnormality. 4. No other acute abnormalities. Electronically Signed   By: Dorise Bullion III M.D   On: 08/09/2018 11:56   Dg Chest Portable 1 View  Result Date: 08/09/2018 CLINICAL DATA:  Weakness.  Epigastric pain.  Diabetes. EXAM: PORTABLE CHEST 1 VIEW COMPARISON:  July 02, 2014 FINDINGS: Stable cardiomegaly. The hila and mediastinum are unremarkable given portable technique.  Mild atelectasis in the bases. No suspicious infiltrates. No overt edema. IMPRESSION: Mild bibasilar atelectasis.  Stable cardiomegaly.  No other changes. Electronically Signed   By: Dorise Bullion III M.D   On: 08/09/2018 09:44   Dg Abd Portable 1v-small Bowel Obstruction Protocol-initial, 8  Hr Delay  Result Date: 08/09/2018 CLINICAL DATA:  Small-bowel obstruction EXAM: PORTABLE ABDOMEN - 1 VIEW COMPARISON:  Same-day CT FINDINGS: Multiple dilated loops of small bowel within the mid abdomen measuring up to 3.1 cm in diameter. No gross free intraperitoneal air. Excreted contrast within the renal collecting systems and urinary bladder. Altered level lumbar spondylosis. IMPRESSION: Findings of small bowel obstruction. Electronically Signed   By: Davina Poke M.D.   On: 08/09/2018 14:24    EKG: Independently reviewed.  NSR with rate 65; nonspecific ST changes with no evidence of acute ischemia   Labs on Admission: I have personally reviewed the available labs and imaging studies at the time of the admission.  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 Carlis Abbott from vascular has evaluated the patient -Continue IVF hydration -ASA -Renal artery duplex US ordered of B kidneys -Continue to follow BMP (none available recently in Epic)  HTN -Hold PO meds including Norvasc, atenolol -Add lopressor BID IV for now  HLD -Hold Zocor for now while NPO  DM -hold Glucophage -Cover with moderate-scale SSI    Note: This patient has been tested and is pending for the novel coronavirus COVID-19.  DVT prophylaxis:  SCDs pending possible need for surgery Code Status:  Full - confirmed with patient/family Family Communication: Daughter present throughout evaluation  Disposition Plan:  Home once clinically improved Consults called: Surgery; vascular  Admission status: It is my clinical opinion that referral for OBSERVATION is reasonable and necessary in this patient based on the above information provided. The aforementioned taken together are felt to place the patient at high risk for further clinical deterioration. However it is anticipated that the patient may be medically stable for discharge from the hospital within 24 to 48 hours.    Karmen Bongo MD [facility]   How to contact the [facility] Attending or Consulting provider [place] or covering provider during after hours [place], for this patient?  1. Check the care team in [facility] and look for a) attending/consulting [facility] provider listed and b) the [facility] team listed 2. Log into [email or web address] and use [place] universal password to access. If you do not have the password, please contact the hospital operator. 3. Locate the [facility] provider you are looking for under [facility] and page to a number that you can be directly reached. 4. If you still have difficulty reaching the provider, please page the [facility] (Director on Call) for the Hospitalists listed on amion for assistance.   08/09/2018, 6:23 PM

## 2018-08-10 ENCOUNTER — Observation Stay (HOSPITAL_BASED_OUTPATIENT_CLINIC_OR_DEPARTMENT_OTHER): Payer: Medicare HMO

## 2018-08-10 DIAGNOSIS — R1084 Generalized abdominal pain: Secondary | ICD-10-CM | POA: Diagnosis not present

## 2018-08-10 DIAGNOSIS — N280 Ischemia and infarction of kidney: Secondary | ICD-10-CM

## 2018-08-10 DIAGNOSIS — K56609 Unspecified intestinal obstruction, unspecified as to partial versus complete obstruction: Principal | ICD-10-CM

## 2018-08-10 LAB — GLUCOSE, CAPILLARY
Glucose-Capillary: 112 mg/dL — ABNORMAL HIGH (ref 70–99)
Glucose-Capillary: 117 mg/dL — ABNORMAL HIGH (ref 70–99)
Glucose-Capillary: 123 mg/dL — ABNORMAL HIGH (ref 70–99)
Glucose-Capillary: 99 mg/dL (ref 70–99)

## 2018-08-10 LAB — BASIC METABOLIC PANEL
Anion gap: 10 (ref 5–15)
BUN: 13 mg/dL (ref 8–23)
CO2: 30 mmol/L (ref 22–32)
Calcium: 9 mg/dL (ref 8.9–10.3)
Chloride: 97 mmol/L — ABNORMAL LOW (ref 98–111)
Creatinine, Ser: 1.41 mg/dL — ABNORMAL HIGH (ref 0.44–1.00)
GFR calc Af Amer: 42 mL/min — ABNORMAL LOW (ref >60)
GFR calc non Af Amer: 36 mL/min — ABNORMAL LOW (ref >60)
Glucose, Bld: 121 mg/dL — ABNORMAL HIGH (ref 70–99)
Potassium: 3.7 mmol/L (ref 3.5–5.1)
Sodium: 137 mmol/L (ref 135–145)

## 2018-08-10 LAB — CBC
HCT: 32.7 % — ABNORMAL LOW (ref 36.0–46.0)
Hemoglobin: 10.7 g/dL — ABNORMAL LOW (ref 12.0–15.0)
MCH: 27.7 pg (ref 26.0–34.0)
MCHC: 32.7 g/dL (ref 30.0–36.0)
MCV: 84.7 fL (ref 80.0–100.0)
Platelets: 274 10*3/uL (ref 150–400)
RBC: 3.86 MIL/uL — ABNORMAL LOW (ref 3.87–5.11)
RDW: 14.9 % (ref 11.5–15.5)
WBC: 7.7 10*3/uL (ref 4.0–10.5)
nRBC: 0 % (ref 0.0–0.2)

## 2018-08-10 MED ORDER — POLYETHYLENE GLYCOL 3350 17 G PO PACK
17.0000 g | PACK | Freq: Every day | ORAL | Status: DC | PRN
  Administered 2018-08-10: 10:00:00 17 g via ORAL
  Filled 2018-08-10: qty 1

## 2018-08-10 MED ORDER — DOCUSATE SODIUM 100 MG PO CAPS
100.0000 mg | ORAL_CAPSULE | Freq: Two times a day (BID) | ORAL | Status: DC
  Administered 2018-08-10: 10:00:00 100 mg via ORAL
  Filled 2018-08-10: qty 1

## 2018-08-10 MED ORDER — ASPIRIN 81 MG PO TBEC: 81.0000 mg | DELAYED_RELEASE_TABLET | Freq: Every day | ORAL | 0 refills | Status: AC

## 2018-08-10 NOTE — Progress Notes (Signed)
Renal artery duplex completed. Refer to "CV Proc" under chart review to view preliminary results.  08/10/2018 9:42 AM Maudry Mayhew, MHA, RVT, RDCS, RDMS

## 2018-08-10 NOTE — Discharge Summary (Signed)
Physician Discharge Summary  Paislynn Hegstrom NWG:[id number] DOB: December 01, 1940 DOA: 08/09/2018  PCP: [name], MD  Admit date: 08/09/2018 Discharge date: 08/10/2018  Admitted From: Home Disposition:  Home  Recommendations for Outpatient Follow-up:  1. Follow up with PCP in 1 week 2. Please obtain BMP in one week 3. Follow-up with vascular surgery, Dr. Carlis Abbott as needed  Home Health: None Equipment/Devices: Rolling walker  Discharge Condition: Stable CODE STATUS: Full code Diet recommendation: Heart Healthy / Carb Modified   History of present illness:  Janilah Hojnacki is a 78 y.o. female with history of hypertension, hyperlipidemia, diabetes that presented to the emergency room with abdominal pain since yesterday and is being evaluated for small bowel obstruction versus ileus and vascular surgery was asked to see for right renal infarct noted on CT.  Patient is in the ED with her daughter who provides most of the history through interpretation. Ultimately patient states she has had upper midline abdominal pain since yesterday evening.  This has been with associated nausea and vomiting.  Pain is in the midline and not on one side or the other.   No previous abdominal surgery. Daughter states had a small BM that was not normal.    CT scan was obtained in the ED with decreased attenuation of most of the right kidney except for the superior pole concerning for right renal ischemia - bulky calcific plaque in both renal artery origins.  No evidence of stranding to suggest acute event around the right kidney.  Other pertinent findings include prominent fluid-filled loops of bowel in the lower abdomen with some free fluid on the left side.  Patient's daughter states no known history of kidney disease.  No previous renal artery interventions.  Hospital course:  Partial small bowel obstruction versus constipation Patient presenting with abdominal pain that progressed over 1 day.  CT abdomen/pelvis  notable for dilated loops of bowel.  General surgery was consulted and followed during hospital course.  Patient underwent small bowel protocol with enema and was able to have multiple bowel movements during the hospitalization.  Patient's diet was advanced and she tolerated without issue.  No further intervention required at this time.  Encouraged to increase fluid intake following hospitalization.  Concern for renal infarction CT abdomen/pelvis on admission with decreased attenuation right kidney consistent with possible renal ischemia.  Vascular surgery was consulted and followed during hospital course.  Bilateral renal artery duplex ultrasound did not show any critical stenosis.  No further intervention required per vascular surgery.  May follow-up outpatient as needed with Dr. Carlis Abbott.  We will continue aspirin 81 mg p.o. daily.  Essential hypertension Continue home medications including Norvasc and atenolol.  Hyperlipidemia: Continue HLD  Type 2 diabetes mellitus Hemoglobin A1c 6.1, well controlled.  Resume home metformin.  Discharge Diagnoses:  Principal Problem:   SBO (small bowel obstruction) (HCC) Active Problems:   Diabetes mellitus type 2 in obese [facility])   Dyslipidemia   Essential hypertension    Discharge Instructions  Discharge Instructions    Call MD for:  difficulty breathing, headache or visual disturbances   Complete by: As directed    Call MD for:  extreme fatigue   Complete by: As directed    Call MD for:  persistant dizziness or light-headedness   Complete by: As directed    Call MD for:  persistant nausea and vomiting   Complete by: As directed    Call MD for:  severe uncontrolled pain   Complete by: As directed  Call MD for:  temperature >100.4   Complete by: As directed    Diet - low sodium heart healthy   Complete by: As directed    Increase activity slowly   Complete by: As directed      Allergies as of 08/10/2018   No Known Allergies      Medication List    TAKE these medications   alendronate 70 MG tablet Commonly known as: FOSAMAX Take 70 mg by mouth once a week. Take with a full glass of water on an empty stomach.   amLODipine 10 MG tablet Commonly known as: NORVASC Take 10 mg by mouth daily.   aspirin 81 MG EC tablet Take 1 tablet (81 mg total) by mouth daily. Start taking on: August 11, 2018   atenolol 50 MG tablet Commonly known as: TENORMIN Take 50 mg by mouth 2 (two) times daily.   calcium carbonate 1500 (600 Ca) MG Tabs tablet Commonly known as: OSCAL Take 600 mg of elemental calcium by mouth 2 (two) times daily with a meal.   gabapentin 100 MG capsule Commonly known as: NEURONTIN Take 100 mg by mouth at bedtime as needed (sleep).   meloxicam 7.5 MG tablet Commonly known as: MOBIC Take 7.5 mg by mouth 2 (two) times daily as needed for pain.   metFORMIN 500 MG tablet Commonly known as: GLUCOPHAGE Take 500 mg by mouth 2 (two) times daily with a meal.   potassium chloride 10 MEQ tablet Commonly known as: K-DUR Take 10 mEq by mouth daily.   simvastatin 10 MG tablet Commonly known as: ZOCOR Take 10 mg by mouth at bedtime.   tiZANidine 4 MG tablet Commonly known as: ZANAFLEX Take 4 mg by mouth 2 (two) times daily.   vitamin C 500 MG tablet Commonly known as: ASCORBIC ACID Take 500 mg by mouth daily.            Durable Medical Equipment  (From admission, onward)         Start     Ordered   08/10/18 1024  For home use only DME Walker rolling  Once    Question:  Patient needs a walker to treat with the following condition  Answer:  Small bowel obstruction ([place])   08/10/18 1024         Follow-up Information    [name], MD. Schedule an appointment as soon as possible for a visit in 1 week(s).   Specialty: Internal Medicine Contact information: [place] [address] [phone]          No Known Allergies  Consultations:  Vascular surgery - Dr.  Carlis Abbott  General surgery   Procedures/Studies: Ct Abdomen Pelvis W Contrast  Result Date: 08/09/2018 CLINICAL DATA:  Right upper quadrant pain.  Cholecystitis suspected. EXAM: CT ABDOMEN AND PELVIS WITH CONTRAST TECHNIQUE: Multidetector CT imaging of the abdomen and pelvis was performed using the standard protocol following bolus administration of intravenous contrast. CONTRAST:  131mL OMNIPAQUE IOHEXOL 300 MG/ML  SOLN COMPARISON:  None. FINDINGS: Lower chest: Cardiomegaly.  The lower chest is otherwise normal. Hepatobiliary: No focal liver abnormality is seen. No gallstones, gallbladder wall thickening, or biliary dilatation. Pancreas: Unremarkable. No pancreatic ductal dilatation or surrounding inflammatory changes. Spleen: Normal in size without focal abnormality. Adrenals/Urinary Tract: Adrenal glands are normal. There is decreased attenuation throughout most of the right kidney with a small region of normal attenuation at the superior pole. No mass or stone identified in either kidney. No hydronephrosis or ureterectasis.  No ureteral stones. The bladder is normal. On delayed images, the superior pole of the right kidney does partially washout with excretion into the upper pole collecting system. There is a paucity of excretion more inferiorly in the right kidney. There is normal excretion on the left. There is not significant perinephric stranding on the right. Stomach/Bowel: The stomach is normal. The duodenum and proximal jejunum are normal. There are multiple prominent loops of small bowel involving the distal half of the jejunum and the proximal half of the ileum. These loops of small bowel are dilated out of proportion to the remainder of the small bowel but do not reach 3 cm in caliber. Two potential transition points are identified. There is a potential transition point in the left pelvis on axial image 60 and coronal image 51. There is fecal material in the small bowel loop just proximal to this  transition point. There is a second potential transition point in the right pelvis on coronal mid image 49 and axial image 59. The colon is unremarkable. The appendix is normal in appearance. Vascular/Lymphatic: Atherosclerotic changes are seen in the nonaneurysmal aorta. The SMA is well opacified. There is dense atherosclerotic change at the origin of the right renal artery. An accessory right renal artery supplying the upper pole is not definitely seen. Atherosclerotic changes involve the iliac and femoral vessels as well. The right renal vein is normal in appearance. Reproductive: A fibroid is seen off the right uterine fundus. Other: Free fluid seen in the pelvis. Free fluid also extends from the left upper quadrant down the pericolic gutter on the left. There is a paucity of free fluid on the right. Musculoskeletal: No acute or significant osseous findings. IMPRESSION: 1. Decreased attenuation in most of the right kidney relative to the superior most aspect of the right kidney and the entire left kidney is most consistent with right renal ischemia. The chronicity of this finding is unclear. The right upper quadrant pain suggests the finding may be acute. However, the lack of adjacent stranding is unusual in the acute setting. Recommend clinical correlation. 2. There are prominent fluid-filled loops of bowel in the lower abdomen and pelvis with some regions of fecal material in the small bowel and 2 potential transition points. While these loops measure less than 3 cm, they are clearly dilated compared to the remainder of more proximal and distal small bowel. The findings are concerning for an early developing small-bowel obstruction. Ileus is a possibility. Recommend clinical correlation and close follow-up. 3. The free fluid in the left side of the abdomen and in the pelvis is likely secondary to the bowel abnormality. 4. No other acute abnormalities. Electronically Signed   By: Dorise Bullion III M.D   On:  08/09/2018 11:56   Dg Chest Portable 1 View  Result Date: 08/09/2018 CLINICAL DATA:  Weakness.  Epigastric pain.  Diabetes. EXAM: PORTABLE CHEST 1 VIEW COMPARISON:  July 02, 2014 FINDINGS: Stable cardiomegaly. The hila and mediastinum are unremarkable given portable technique. Mild atelectasis in the bases. No suspicious infiltrates. No overt edema. IMPRESSION: Mild bibasilar atelectasis.  Stable cardiomegaly.  No other changes. Electronically Signed   By: Dorise Bullion III M.D   On: 08/09/2018 09:44   Dg Abd Portable 1v-small Bowel Obstruction Protocol-initial, 8 Hr Delay  Result Date: 08/09/2018 CLINICAL DATA:  8 hour delay small-bowel follow-through EXAM: PORTABLE ABDOMEN - 1 VIEW COMPARISON:  Film from earlier in the same day. FINDINGS: Contrast material is noted within the stomach as  well as throughout the small bowel and extending into the proximal colon. Persistent mild small bowel dilatation is identified. Degenerative changes of lumbar spine are noted. IMPRESSION: Contrast material is passed throughout the small bowel to the proximal colon. Mild persistent dilatation is noted consistent with a partial small bowel obstruction. Electronically Signed   By: Inez Catalina M.D.   On: 08/09/2018 23:31   Dg Abd Portable 1v-small Bowel Obstruction Protocol-initial, 8 Hr Delay  Result Date: 08/09/2018 CLINICAL DATA:  Small-bowel obstruction EXAM: PORTABLE ABDOMEN - 1 VIEW COMPARISON:  Same-day CT FINDINGS: Multiple dilated loops of small bowel within the mid abdomen measuring up to 3.1 cm in diameter. No gross free intraperitoneal air. Excreted contrast within the renal collecting systems and urinary bladder. Altered level lumbar spondylosis. IMPRESSION: Findings of small bowel obstruction. Electronically Signed   By: Davina Poke M.D.   On: 08/09/2018 14:24   Vas US Renal Artery Duplex  Result Date: 08/10/2018 ABDOMINAL VISCERAL Indications: Right kidney infarct. Calcified renal artery origin seen  on CT. High Risk Factors: Hypertension, hyperlipidemia, Diabetes. Other Factors: Small bowel obstruction. Limitations: Air/bowel gas and obesity. Comparison Study: No prior study. Performing Technologist: Maudry Mayhew MHA, RDMS, RVT, RDCS  Examination Guidelines: A complete evaluation includes B-mode imaging, spectral Doppler, color Doppler, and power Doppler as needed of all accessible portions of each vessel. Bilateral testing is considered an integral part of a complete examination. Limited examinations for reoccurring indications may be performed as noted.  Duplex Findings: +--------------------+--------+--------+------+--------------------------------+ Mesenteric          PSV cm/sEDV cm/sPlaque            Comments             +--------------------+--------+--------+------+--------------------------------+ Aorta Prox             74      14                                          +--------------------+--------+--------+------+--------------------------------+ Celiac Artery Origin  238      17                                          +--------------------+--------+--------+------+--------------------------------+ SMA Proximal           72      10           Low resistant- pt had small                                                amount of breakfast (liquid                                                           diet)               +--------------------+--------+--------+------+--------------------------------+  +------------------+--------+--------+-------+ Right Renal ArteryPSV cm/sEDV cm/sComment +------------------+--------+--------+-------+ Origin              106      34           +------------------+--------+--------+-------+  Proximal            126      34           +------------------+--------+--------+-------+ Mid                 112      16           +------------------+--------+--------+-------+ Distal               37      7             +------------------+--------+--------+-------+ +-----------------+-------+--------+-------------------------------------------+ Left Renal Artery  PSV  EDV cm/s                  Comment                                     cm/s                                                      +-----------------+-------+--------+-------------------------------------------+ Origin             93      21                                               +-----------------+-------+--------+-------------------------------------------+ Proximal                         Unable to insonate due to overlying bowel                                                      gas                     +-----------------+-------+--------+-------------------------------------------+ Mid                58      12                                               +-----------------+-------+--------+-------------------------------------------+ Distal             102     15                                               +-----------------+-------+--------+-------------------------------------------+ +------------+--------+--------+----+-----------+--------+--------+----+ Right KidneyPSV cm/sEDV cm/sRI  Left KidneyPSV cm/sEDV cm/sRI   +------------+--------+--------+----+-----------+--------+--------+----+ Upper Pole  10      2       0.78Upper Pole 16      5       0.68 +------------+--------+--------+----+-----------+--------+--------+----+ Mid         12      4       0.71Mid  29      7       0.77 +------------+--------+--------+----+-----------+--------+--------+----+ Lower Pole  16      4       0.74Lower Pole 35      8       0.78 +------------+--------+--------+----+-----------+--------+--------+----+ Hilar       38      7       0.81Hilar      64      12      0.81 +------------+--------+--------+----+-----------+--------+--------+----+  +------------------+--------+------------------+---------+ Right Kidney              Left Kidney                 +------------------+--------+------------------+---------+ RAR                       RAR                         +------------------+--------+------------------+---------+ RAR (manual)      1.70    RAR (manual)      1.38      +------------------+--------+------------------+---------+ Cortex            9/2 cm/sCortex            28/2 cm/s +------------------+--------+------------------+---------+ Cortex thickness          Corex thickness             +------------------+--------+------------------+---------+ Kidney length (cm)9.02    Kidney length (cm)9.13      +------------------+--------+------------------+---------+  Summary: Renal:  Right: 1-59% stenosis of the right renal artery. Portions of this        exam were limited given technical difficulties listed above. Left:  1-59% stenosis of the left renal artery. Portions of this        exam were limited given technical difficulties listed above. 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 Portions of this exam were limited given technical difficulties listed above.  *See table(s) above for measurements and observations.     Preliminary       Subjective: Patient seen and examined at bedside, resting comfortably.  Reports large bowel movement overnight.  Abdominal pain is resolved.  No complaints this morning.  Daughter at bedside and updated.  Ready for discharge home.  Denies headache, no fever/chills/night sweats, no nausea vomitus diarrhea, no chest pain, no palpitations, no abdominal pain, no weakness, no paresthesias.  No acute events overnight per nurse staff.   Discharge Exam: Vitals:   08/09/18 2131 08/10/18 0451  BP: (!) 144/81 134/60  Pulse: 68 73  Resp: 19 18  Temp: 98.8 F (37.1 C) 99.3 F (37.4 C)  SpO2: 100% 91%   Vitals:   08/09/18 1600 08/09/18 1717 08/09/18 2131 08/10/18 0451  BP:  (!) 166/78 (!) 149/83 (!) 144/81 134/60  Pulse: 64 65 68 73  Resp: 17 17 19 18   Temp:  99.6 F (37.6 C) 98.8 F (37.1 C) 99.3 F (37.4 C)  TempSrc:  Oral Oral Oral  SpO2: 95% 100% 100% 91%  Weight:  77.1 kg      General: Pt is alert, awake, not in acute distress Cardiovascular: RRR, S1/S2 +, no rubs, no gallops Respiratory: CTA bilaterally, no wheezing, no rhonchi Abdominal: Soft, NT, ND, bowel sounds + Extremities: no edema, no cyanosis    The results of significant diagnostics from this hospitalization (including imaging, microbiology, ancillary and laboratory) are listed below for reference.  Microbiology: Recent Results (from the past 240 hour(s))  SARS CORONAVIRUS 2 Nasal Swab Aptima Multi Swab     Status: None   Collection Time: 08/09/18 12:26 PM   Specimen: Aptima Multi Swab; Nasal Swab  Result Value Ref Range Status   SARS Coronavirus 2 NEGATIVE NEGATIVE Final    Comment: (NOTE) SARS-CoV-2 target nucleic acids are NOT DETECTED. The SARS-CoV-2 RNA is generally detectable in upper and lower respiratory specimens during the acute phase of infection. Negative results do not preclude SARS-CoV-2 infection, do not rule out co-infections with other pathogens, and should not be used as the sole basis for treatment or other patient management decisions. Negative results must be combined with clinical observations, patient history, and epidemiological information. The expected result is Negative. Fact Sheet for Patients: SugarRoll.be Fact Sheet for Healthcare Providers: https://www.woods-mathews.com/ This test is not yet approved or cleared by the [place] FDA and  has been authorized for detection and/or diagnosis of SARS-CoV-2 by FDA under an Emergency Use Authorization (EUA). This EUA will remain  in effect (meaning this test can be used) for the duration of the COVID-19 declaration under Section 56 4(b)(1) of the Act,  21 U.S.C. section 360bbb-3(b)(1), unless the authorization is terminated or revoked sooner. Performed at [facility], [place] [address], [place], [place] [address]      Labs: BNP (last 3 results) No results for input(s): BNP in the last 8760 hours. Basic Metabolic Panel: Recent Labs  Lab 08/09/18 0912 08/10/18 0403  NA 132* 137  K 3.2* 3.7  CL 92* 97*  CO2 27 30  GLUCOSE 195* 121*  BUN 12 13  CREATININE 1.18* 1.41*  CALCIUM 10.1 9.0   Liver Function Tests: Recent Labs  Lab 08/09/18 0912  AST 21  ALT 9  ALKPHOS 38  BILITOT 0.6  PROT 8.1  ALBUMIN 4.1   Recent Labs  Lab 08/09/18 0912  LIPASE 20   No results for input(s): AMMONIA in the last 168 hours. CBC: Recent Labs  Lab 08/09/18 0912 08/10/18 0403  WBC 10.0 7.7  NEUTROABS 6.8  --   HGB 13.5 10.7*  HCT 41.1 32.7*  MCV 83.7 84.7  PLT 310 274   Cardiac Enzymes: No results for input(s): CKTOTAL, CKMB, CKMBINDEX, TROPONINI in the last 168 hours. BNP: Invalid input(s): POCBNP CBG: Recent Labs  Lab 08/09/18 2134 08/10/18 0014 08/10/18 0454 08/10/18 0756 08/10/18 1210  GLUCAP 130* 123* 117* 112* 99   D-Dimer No results for input(s): DDIMER in the last 72 hours. Hgb A1c Recent Labs    08/09/18 1702  HGBA1C 6.1*   Lipid Profile No results for input(s): CHOL, HDL, LDLCALC, TRIG, CHOLHDL, LDLDIRECT in the last 72 hours. Thyroid function studies No results for input(s): TSH, T4TOTAL, T3FREE, THYROIDAB in the last 72 hours.  Invalid input(s): FREET3 Anemia work up No results for input(s): VITAMINB12, FOLATE, FERRITIN, TIBC, IRON, RETICCTPCT in the last 72 hours. Urinalysis    Component Value Date/Time   COLORURINE YELLOW 08/09/2018 0906   APPEARANCEUR CLEAR 08/09/2018 0906   LABSPEC 1.012 08/09/2018 0906   PHURINE 9.0 (H) 08/09/2018 0906   GLUCOSEU 50 (A) 08/09/2018 0906   HGBUR NEGATIVE 08/09/2018 0906   BILIRUBINUR NEGATIVE 08/09/2018 0906   KETONESUR NEGATIVE 08/09/2018 0906    PROTEINUR 100 (A) 08/09/2018 0906   NITRITE NEGATIVE 08/09/2018 0906   LEUKOCYTESUR NEGATIVE 08/09/2018 0906   Sepsis Labs Invalid input(s): PROCALCITONIN,  WBC,  LACTICIDVEN Microbiology Recent Results (from the past 240 hour(s))  SARS  CORONAVIRUS 2 Nasal Swab Aptima Multi Swab     Status: None   Collection Time: 08/09/18 12:26 PM   Specimen: Aptima Multi Swab; Nasal Swab  Result Value Ref Range Status   SARS Coronavirus 2 NEGATIVE NEGATIVE Final    Comment: (NOTE) SARS-CoV-2 target nucleic acids are NOT DETECTED. The SARS-CoV-2 RNA is generally detectable in upper and lower respiratory specimens during the acute phase of infection. Negative results do not preclude SARS-CoV-2 infection, do not rule out co-infections with other pathogens, and should not be used as the sole basis for treatment or other patient management decisions. Negative results must be combined with clinical observations, patient history, and epidemiological information. The expected result is Negative. Fact Sheet for Patients: SugarRoll.be Fact Sheet for Healthcare Providers: https://www.woods-mathews.com/ This test is not yet approved or cleared by the [place] FDA and  has been authorized for detection and/or diagnosis of SARS-CoV-2 by FDA under an Emergency Use Authorization (EUA). This EUA will remain  in effect (meaning this test can be used) for the duration of the COVID-19 declaration under Section 56 4(b)(1) of the Act, 21 U.S.C. section 360bbb-3(b)(1), unless the authorization is terminated or revoked sooner. Performed at [facility], [place] [address] [place], [place] [address]      Time coordinating discharge: Over 30 minutes  SIGNED:   Leoni Goodness J [place] DO  [facility] 08/10/2018, 2:48 PM

## 2018-08-10 NOTE — Care Management Obs Status (Signed)
[place] NOTIFICATION   Patient Details  Name: Mandy [name] MRN: [id number] Date of Birth: [birth year]   Medicare Observation Status Notification Given:  Yes    Marilu Favre, RN 08/10/2018, 10:25 AM

## 2018-08-10 NOTE — TOC Initial Note (Signed)
Transition of Care [facility]) - Initial/Assessment Note    Patient Details  Name: Mandy [name] MRN: [id number] Date of Birth: 1940-09-21  Transition of Care [facility]) CM/SW Contact:    Marilu Favre, RN Phone Number: 08/10/2018, 10:28 AM  Clinical Narrative:                 Confirmed face sheet information with patient and daughter at bedside. Patient lives with daughter and has PCP , Dr Jeanie Cooks.   Patient requesting rolling walker for home. Patient and daughter do not feel that patient needs a PT evaluation at this time.   Walker ordered to be delivered to patient's room today.  Expected Discharge Plan: Home/Self Care Barriers to Discharge: Continued Medical Work up   Patient Goals and CMS Choice Patient states their goals for this hospitalization and ongoing recovery are:: to go home CMS Medicare.gov Compare Post Acute Care list provided to:: Patient Choice offered to / list presented to : Patient  Expected Discharge Plan and Services Expected Discharge Plan: Home/Self Care   Discharge Planning Services: CM Consult Post Acute Care Choice: Durable Medical Equipment Living arrangements for the past 2 months: Single Family Home                 DME Arranged: Walker rolling DME Agency: [employer] Date DME Agency Contacted: 08/10/18 Time DME Agency Contacted: 1027 Representative spoke with at DME Agency: [place]: NA          Prior Living Arrangements/Services Living arrangements for the past 2 months: [place] Lives with:: Adult Children Patient language and need for interpreter reviewed:: Yes Do you feel safe going back to the place where you live?: Yes      Need for Family Participation in Patient Care: No (Comment) Care giver support system in place?: Yes (comment)   Criminal Activity/Legal Involvement Pertinent to Current Situation/Hospitalization: No - Comment as needed  Activities of Daily Living Home Assistive Devices/Equipment: None ADL  Screening (condition at time of admission) Patient's cognitive ability adequate to safely complete daily activities?: Yes Is the patient deaf or have difficulty hearing?: No Does the patient have difficulty seeing, even when wearing glasses/contacts?: No Does the patient have difficulty concentrating, remembering, or making decisions?: No Patient able to express need for assistance with ADLs?: Yes Does the patient have difficulty dressing or bathing?: No Independently performs ADLs?: Yes (appropriate for developmental age) Does the patient have difficulty walking or climbing stairs?: Yes Weakness of Legs: Both Weakness of Arms/Hands: None  Permission Sought/Granted   Permission granted to share information with : Yes, Verbal Permission Granted  Share Information with NAME: [name]     Permission granted to share info w Relationship: daughter     Emotional Assessment Appearance:: Appears stated age Attitude/Demeanor/Rapport: Engaged Affect (typically observed): Accepting Orientation: : Oriented to Self, Oriented to Place, Oriented to  Time, Oriented to Situation Alcohol / Substance Use: Not Applicable Psych Involvement: No (comment)  Admission diagnosis:  Small bowel obstruction (HCC) [K56.609] SBO (small bowel obstruction) (HCC) [K56.609] Elevated troponin level [R79.89] Patient Active Problem List   Diagnosis Date Noted  . SBO (small bowel obstruction) ([employer]) 08/09/2018  . 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 Diabetes mellitus type 2 in obese ([place]) 04/07/2008  . Dyslipidemia 04/07/2008  . Essential hypertension 04/07/2008  . SORE THROAT 04/07/2008  . GERD 04/07/2008   PCP:  [name], MD Pharmacy:   [place] [address] [place]  [address] [place] [address] Phone: [phone] Fax: [phone]     Social Determinants of Health (SDOH) Interventions    Readmission Risk Interventions No flowsheet data  found.

## 2018-08-10 NOTE — Progress Notes (Signed)
[facility]  Subjective  - States she feels better.   Objective 134/60 73 99.3 F (37.4 C) (Oral) 18 91%  Intake/Output Summary (Last 24 hours) at 08/10/2018 1241 Last data filed at 08/10/2018 0948 Gross per 24 hour  Intake 2150.02 ml  Output -  Net 2150.02 ml    No significant abdominal pain.  Laboratory Lab Results: Recent Labs    08/09/18 0912 08/10/18 0403  WBC 10.0 7.7  HGB 13.5 10.7*  HCT 41.1 32.7*  PLT 310 274   BMET Recent Labs    08/09/18 0912 08/10/18 0403  NA 132* 137  K 3.2* 3.7  CL 92* 97*  CO2 27 30  GLUCOSE 195* 121*  BUN 12 13  CREATININE 1.18* 1.41*  CALCIUM 10.1 9.0    COAG No results found for: INR, PROTIME No results found for: PTT  Assessment/Planning:  78 year old female admitted with a small bowel obstruction versus ileus picture yesterday and vascular surgery was asked to evaluate poor enhancement of the right kidney.  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  Renal artery duplex was obtained this morning and I have independently reviewed the images and this shows that the right proximal mid distal renal artery is indeed patent with no evidence of critical stenosis.  In addition she has no critical stenosis on the left.  Discussed with patient and her daughter that I do not favor any aggressive intervention at this time, especially given duplex findings.  Just ongoing conservative measures with supportive care.  She can follow-up as needed.  Vascular surgery will sign off.  Call with questions or concerns.  Marty Heck 08/10/2018 12:41 PM --

## 2018-08-10 NOTE — Progress Notes (Signed)
[facility] Progress Note     Subjective: CC-  Daughter at bedside. States that she still had abdominal pain and n/v last night, but she is feeling much better this morning. She had a large BM a few hours ago. Passing flatus. Feels a little hungry. Currently she denies any abdominal pain. Xray shows contrast in colon.  Objective: Vital signs in last 24 hours: Temp:  [98.4 F (36.9 C)-99.6 F (37.6 C)] 99.3 F (37.4 C) (08/07 0451) Pulse Rate:  [64-91] 73 (08/07 0451) Resp:  [15-23] 18 (08/07 0451) BP: (134-193)/(60-92) 134/60 (08/07 0451) SpO2:  [91 %-100 %] 91 % (08/07 0451) Weight:  [77.1 kg] 77.1 kg (08/06 1717) Last BM Date: 08/09/18  Intake/Output from previous day: 08/06 0701 - 08/07 0700 In: 1800 [I.V.:800; IV Piggyback:1000] Out: -  Intake/Output this shift: No intake/output data recorded.  PE: Gen:  Alert, NAD, pleasant HEENT: EOM's intact, pupils equal and round Pulm:  Rate and effort normal Abd: Soft, NT/ND, +BS, no HSM, no hernia Skin: no rashes noted, warm and dry  Lab Results:  Recent Labs    08/09/18 0912 08/10/18 0403  WBC 10.0 7.7  HGB 13.5 10.7*  HCT 41.1 32.7*  PLT 310 274   BMET Recent Labs    08/09/18 0912 08/10/18 0403  NA 132* 137  K 3.2* 3.7  CL 92* 97*  CO2 27 30  GLUCOSE 195* 121*  BUN 12 13  CREATININE 1.18* 1.41*  CALCIUM 10.1 9.0   PT/INR No results for input(s): LABPROT, INR in the last 72 hours. CMP     Component Value Date/Time   NA 137 08/10/2018 0403   K 3.7 08/10/2018 0403   CL 97 (L) 08/10/2018 0403   CO2 30 08/10/2018 0403   GLUCOSE 121 (H) 08/10/2018 0403   BUN 13 08/10/2018 0403   CREATININE 1.41 (H) 08/10/2018 0403   CALCIUM 9.0 08/10/2018 0403   PROT 8.1 08/09/2018 0912   ALBUMIN 4.1 08/09/2018 0912   AST 21 08/09/2018 0912   ALT 9 08/09/2018 0912   ALKPHOS 38 08/09/2018 0912   BILITOT 0.6 08/09/2018 0912   GFRNONAA 36 (L) 08/10/2018 0403   GFRAA 42 (L) 08/10/2018 0403   Lipase      Component Value Date/Time   LIPASE 20 08/09/2018 0912       Studies/Results: Ct Abdomen Pelvis W Contrast  Result Date: 08/09/2018 CLINICAL DATA:  Right upper quadrant pain.  Cholecystitis suspected. EXAM: CT ABDOMEN AND PELVIS WITH CONTRAST TECHNIQUE: Multidetector CT imaging of the abdomen and pelvis was performed using the standard protocol following bolus administration of intravenous contrast. CONTRAST:  165mL OMNIPAQUE IOHEXOL 300 MG/ML  SOLN COMPARISON:  None. FINDINGS: Lower chest: Cardiomegaly.  The lower chest is otherwise normal. Hepatobiliary: No focal liver abnormality is seen. No gallstones, gallbladder wall thickening, or biliary dilatation. Pancreas: Unremarkable. No pancreatic ductal dilatation or surrounding inflammatory changes. Spleen: Normal in size without focal abnormality. Adrenals/Urinary Tract: Adrenal glands are normal. There is decreased attenuation throughout most of the right kidney with a small region of normal attenuation at the superior pole. No mass or stone identified in either kidney. No hydronephrosis or ureterectasis. No ureteral stones. The bladder is normal. On delayed images, the superior pole of the right kidney does partially washout with excretion into the upper pole collecting system. There is a paucity of excretion more inferiorly in the right kidney. There is normal excretion on the left. There is not significant perinephric stranding on the right. Stomach/Bowel:  The stomach is normal. The duodenum and proximal jejunum are normal. There are multiple prominent loops of small bowel involving the distal half of the jejunum and the proximal half of the ileum. These loops of small bowel are dilated out of proportion to the remainder of the small bowel but do not reach 3 cm in caliber. Two potential transition points are identified. There is a potential transition point in the left pelvis on axial image 60 and coronal image 51. There is fecal material in the  small bowel loop just proximal to this transition point. There is a second potential transition point in the right pelvis on coronal mid image 49 and axial image 59. The colon is unremarkable. The appendix is normal in appearance. Vascular/Lymphatic: Atherosclerotic changes are seen in the nonaneurysmal aorta. The SMA is well opacified. There is dense atherosclerotic change at the origin of the right renal artery. An accessory right renal artery supplying the upper pole is not definitely seen. Atherosclerotic changes involve the iliac and femoral vessels as well. The right renal vein is normal in appearance. Reproductive: A fibroid is seen off the right uterine fundus. Other: Free fluid seen in the pelvis. Free fluid also extends from the left upper quadrant down the pericolic gutter on the left. There is a paucity of free fluid on the right. Musculoskeletal: No acute or significant osseous findings. IMPRESSION: 1. Decreased attenuation in most of the right kidney relative to the superior most aspect of the right kidney and the entire left kidney is most consistent with right renal ischemia. The chronicity of this finding is unclear. The right upper quadrant pain suggests the finding may be acute. However, the lack of adjacent stranding is unusual in the acute setting. Recommend clinical correlation. 2. There are prominent fluid-filled loops of bowel in the lower abdomen and pelvis with some regions of fecal material in the small bowel and 2 potential transition points. While these loops measure less than 3 cm, they are clearly dilated compared to the remainder of more proximal and distal small bowel. The findings are concerning for an early developing small-bowel obstruction. Ileus is a possibility. Recommend clinical correlation and close follow-up. 3. The free fluid in the left side of the abdomen and in the pelvis is likely secondary to the bowel abnormality. 4. No other acute abnormalities. Electronically  Signed   By: Dorise Bullion III M.D   On: 08/09/2018 11:56   Dg Chest Portable 1 View  Result Date: 08/09/2018 CLINICAL DATA:  Weakness.  Epigastric pain.  Diabetes. EXAM: PORTABLE CHEST 1 VIEW COMPARISON:  July 02, 2014 FINDINGS: Stable cardiomegaly. The hila and mediastinum are unremarkable given portable technique. Mild atelectasis in the bases. No suspicious infiltrates. No overt edema. IMPRESSION: Mild bibasilar atelectasis.  Stable cardiomegaly.  No other changes. Electronically Signed   By: Dorise Bullion III M.D   On: 08/09/2018 09:44   Dg Abd Portable 1v-small Bowel Obstruction Protocol-initial, 8 Hr Delay  Result Date: 08/09/2018 CLINICAL DATA:  8 hour delay small-bowel follow-through EXAM: PORTABLE ABDOMEN - 1 VIEW COMPARISON:  Film from earlier in the same day. FINDINGS: Contrast material is noted within the stomach as well as throughout the small bowel and extending into the proximal colon. Persistent mild small bowel dilatation is identified. Degenerative changes of lumbar spine are noted. IMPRESSION: Contrast material is passed throughout the small bowel to the proximal colon. Mild persistent dilatation is noted consistent with a partial small bowel obstruction. Electronically Signed   By: Elta Guadeloupe  Lukens M.D.   On: 08/09/2018 23:31   Dg Abd Portable 1v-small Bowel Obstruction Protocol-initial, 8 Hr Delay  Result Date: 08/09/2018 CLINICAL DATA:  Small-bowel obstruction EXAM: PORTABLE ABDOMEN - 1 VIEW COMPARISON:  Same-day CT FINDINGS: Multiple dilated loops of small bowel within the mid abdomen measuring up to 3.1 cm in diameter. No gross free intraperitoneal air. Excreted contrast within the renal collecting systems and urinary bladder. Altered level lumbar spondylosis. IMPRESSION: Findings of small bowel obstruction. Electronically Signed   By: Davina Poke M.D.   On: 08/09/2018 14:24    Anti-infectives: Anti-infectives (From admission, onward)   None        Assessment/Plan HTN HLD DM ?Right renal ischemia - per vascular surgery  pSBO vs Ileus Constipation - no prior h/o abdominal surgery - CT scan 8/6 shows prominent fluid-filled loops of bowel in the lower abdomen and pelvis with some regions of fecal material in the small bowel and 2 potential transition points, possible early SBO  ID - none VTE - SCDs, ok for chemical DVT prophylaxis from surgical standpoint FEN - IVF, CLD ADAT Foley - none Follow up - TBD  Plan - Abdominal pain resolved and patient having bowel function. Will start clear liquids and advance diet as tolerated. Discussed with patient and daughter that she may need to be on a bowel regimen at home to prevent constipation. Encouraged eating fiber and drinking plenty of water, as well as regular exercise/walking.  Patient stable for discharge from surgical standpoint once tolerating diet.   LOS: 0 days    Wellington [place] , [facility] 08/10/2018, 7:58 AM Pager: [phone] Mon-Thurs 7:00 am-4:30 pm Fri 7:00 am -11:30 AM Sat-Sun 7:00 am-11:30 am

## 2018-08-10 NOTE — Plan of Care (Signed)
Pt for discharge going home, daughter at the bedside, discontinued peripheral IV line, able to tolerate her food, no s/s of nausea/vomiting, able to ambulate in the hallway 3-4/day, had large bowel movement, no complain of pain at this time, given health teachings, next appointment, due med explained and understood.

## 2018-08-12 ENCOUNTER — Telehealth: Payer: Self-pay

## 2018-08-12 NOTE — Telephone Encounter (Signed)
Instructed pt during encounter with the [facility] Radiology Department, their may have been exposure to an unknown GI illness. Informed that it is suspected to be a food borne illness that can be transmitted person to person, but informed we are still investigating. Pt advised if they develop nausea, vomiting, or diarrhea in the next day or two to call [phone] from 7 am to 7 pm. Pt advised if they develop severe symptoms, please seek medical attention. Pt advised they are not at high risk of getting this illness and informed pt that testing is not recommended for asymptomatic patients.

## 2018-08-15 ENCOUNTER — Other Ambulatory Visit: Payer: Self-pay | Admitting: Internal Medicine

## 2018-08-15 DIAGNOSIS — Z1231 Encounter for screening mammogram for malignant neoplasm of breast: Secondary | ICD-10-CM

## 2018-10-02 ENCOUNTER — Other Ambulatory Visit: Payer: Self-pay

## 2018-10-02 ENCOUNTER — Ambulatory Visit
Admission: RE | Admit: 2018-10-02 | Discharge: 2018-10-02 | Disposition: A | Discharge status: Home / Self Care | Payer: Medicare HMO | Source: Ambulatory Visit | Attending: Internal Medicine | Admitting: Internal Medicine

## 2018-10-02 DIAGNOSIS — Z1231 Encounter for screening mammogram for malignant neoplasm of breast: Secondary | ICD-10-CM

## 2018-10-08 ENCOUNTER — Encounter (HOSPITAL_COMMUNITY): Payer: Self-pay | Admitting: Emergency Medicine

## 2018-10-08 ENCOUNTER — Emergency Department (HOSPITAL_COMMUNITY): Payer: Medicare HMO

## 2018-10-08 ENCOUNTER — Other Ambulatory Visit: Payer: Self-pay

## 2018-10-08 ENCOUNTER — Inpatient Hospital Stay (HOSPITAL_COMMUNITY)
Admission: EM | Admit: 2018-10-08 | Discharge: 2018-10-11 | DRG: 390 | Disposition: A | Discharge status: Home / Self Care | Payer: Medicare HMO | Attending: Family Medicine | Admitting: Family Medicine

## 2018-10-08 ENCOUNTER — Inpatient Hospital Stay (HOSPITAL_COMMUNITY): Payer: Medicare HMO

## 2018-10-08 DIAGNOSIS — E876 Hypokalemia: Secondary | ICD-10-CM | POA: Diagnosis not present

## 2018-10-08 DIAGNOSIS — I700 Atherosclerosis of aorta: Secondary | ICD-10-CM

## 2018-10-08 DIAGNOSIS — M810 Age-related osteoporosis without current pathological fracture: Secondary | ICD-10-CM | POA: Diagnosis present

## 2018-10-08 DIAGNOSIS — Z9841 Cataract extraction status, right eye: Secondary | ICD-10-CM | POA: Diagnosis not present

## 2018-10-08 DIAGNOSIS — E785 Hyperlipidemia, unspecified: Secondary | ICD-10-CM | POA: Diagnosis not present

## 2018-10-08 DIAGNOSIS — Z7984 Long term (current) use of oral hypoglycemic drugs: Secondary | ICD-10-CM

## 2018-10-08 DIAGNOSIS — Z841 Family history of disorders of kidney and ureter: Secondary | ICD-10-CM | POA: Diagnosis not present

## 2018-10-08 DIAGNOSIS — N838 Other noninflammatory disorders of ovary, fallopian tube and broad ligament: Secondary | ICD-10-CM

## 2018-10-08 DIAGNOSIS — E1169 Type 2 diabetes mellitus with other specified complication: Secondary | ICD-10-CM | POA: Diagnosis not present

## 2018-10-08 DIAGNOSIS — Z20828 Contact with and (suspected) exposure to other viral communicable diseases: Secondary | ICD-10-CM | POA: Diagnosis present

## 2018-10-08 DIAGNOSIS — K760 Fatty (change of) liver, not elsewhere classified: Secondary | ICD-10-CM

## 2018-10-08 DIAGNOSIS — Z9842 Cataract extraction status, left eye: Secondary | ICD-10-CM

## 2018-10-08 DIAGNOSIS — Z87448 Personal history of other diseases of urinary system: Secondary | ICD-10-CM | POA: Diagnosis not present

## 2018-10-08 DIAGNOSIS — N83201 Unspecified ovarian cyst, right side: Secondary | ICD-10-CM | POA: Diagnosis present

## 2018-10-08 DIAGNOSIS — E119 Type 2 diabetes mellitus without complications: Secondary | ICD-10-CM | POA: Diagnosis present

## 2018-10-08 DIAGNOSIS — Z7982 Long term (current) use of aspirin: Secondary | ICD-10-CM | POA: Diagnosis not present

## 2018-10-08 DIAGNOSIS — Z79899 Other long term (current) drug therapy: Secondary | ICD-10-CM | POA: Diagnosis not present

## 2018-10-08 DIAGNOSIS — Z791 Long term (current) use of non-steroidal anti-inflammatories (NSAID): Secondary | ICD-10-CM

## 2018-10-08 DIAGNOSIS — Z6831 Body mass index (BMI) 31.0-31.9, adult: Secondary | ICD-10-CM | POA: Diagnosis not present

## 2018-10-08 DIAGNOSIS — I10 Essential (primary) hypertension: Secondary | ICD-10-CM | POA: Diagnosis not present

## 2018-10-08 DIAGNOSIS — R778 Other specified abnormalities of plasma proteins: Secondary | ICD-10-CM

## 2018-10-08 DIAGNOSIS — Z7983 Long term (current) use of bisphosphonates: Secondary | ICD-10-CM | POA: Diagnosis not present

## 2018-10-08 DIAGNOSIS — R1013 Epigastric pain: Secondary | ICD-10-CM | POA: Diagnosis present

## 2018-10-08 DIAGNOSIS — E1142 Type 2 diabetes mellitus with diabetic polyneuropathy: Secondary | ICD-10-CM | POA: Diagnosis present

## 2018-10-08 DIAGNOSIS — Z961 Presence of intraocular lens: Secondary | ICD-10-CM | POA: Diagnosis present

## 2018-10-08 DIAGNOSIS — K56609 Unspecified intestinal obstruction, unspecified as to partial versus complete obstruction: Principal | ICD-10-CM

## 2018-10-08 DIAGNOSIS — R0902 Hypoxemia: Secondary | ICD-10-CM | POA: Diagnosis present

## 2018-10-08 DIAGNOSIS — R7989 Other specified abnormal findings of blood chemistry: Secondary | ICD-10-CM

## 2018-10-08 DIAGNOSIS — E1121 Type 2 diabetes mellitus with diabetic nephropathy: Secondary | ICD-10-CM | POA: Diagnosis present

## 2018-10-08 DIAGNOSIS — E669 Obesity, unspecified: Secondary | ICD-10-CM | POA: Diagnosis not present

## 2018-10-08 HISTORY — DX: Fatty (change of) liver, not elsewhere classified: K76.0

## 2018-10-08 HISTORY — DX: Atherosclerosis of aorta: I70.0

## 2018-10-08 HISTORY — DX: Other noninflammatory disorders of ovary, fallopian tube and broad ligament: N83.8

## 2018-10-08 LAB — COMPREHENSIVE METABOLIC PANEL
ALT: 16 U/L (ref 0–44)
AST: 27 U/L (ref 15–41)
Albumin: 4.5 g/dL (ref 3.5–5.0)
Alkaline Phosphatase: 46 U/L (ref 38–126)
Anion gap: 11 (ref 5–15)
BUN: 19 mg/dL (ref 8–23)
CO2: 27 mmol/L (ref 22–32)
Calcium: 9.7 mg/dL (ref 8.9–10.3)
Chloride: 98 mmol/L (ref 98–111)
Creatinine, Ser: 1.23 mg/dL — ABNORMAL HIGH (ref 0.44–1.00)
GFR calc Af Amer: 49 mL/min — ABNORMAL LOW (ref >60)
GFR calc non Af Amer: 42 mL/min — ABNORMAL LOW (ref >60)
Glucose, Bld: 203 mg/dL — ABNORMAL HIGH (ref 70–99)
Potassium: 3.8 mmol/L (ref 3.5–5.1)
Sodium: 136 mmol/L (ref 135–145)
Total Bilirubin: 0.4 mg/dL (ref 0.3–1.2)
Total Protein: 8.6 g/dL — ABNORMAL HIGH (ref 6.5–8.1)

## 2018-10-08 LAB — CBC WITH DIFFERENTIAL/PLATELET
Abs Immature Granulocytes: 0.04 10*3/uL (ref 0.00–0.07)
Basophils Absolute: 0 10*3/uL (ref 0.0–0.1)
Basophils Relative: 0 %
Eosinophils Absolute: 0.3 10*3/uL (ref 0.0–0.5)
Eosinophils Relative: 2 %
HCT: 43.6 % (ref 36.0–46.0)
Hemoglobin: 14.1 g/dL (ref 12.0–15.0)
Immature Granulocytes: 0 %
Lymphocytes Relative: 11 %
Lymphs Abs: 1.3 10*3/uL (ref 0.7–4.0)
MCH: 27.2 pg (ref 26.0–34.0)
MCHC: 32.3 g/dL (ref 30.0–36.0)
MCV: 84.2 fL (ref 80.0–100.0)
Monocytes Absolute: 0.2 10*3/uL (ref 0.1–1.0)
Monocytes Relative: 2 %
Neutro Abs: 9.4 10*3/uL — ABNORMAL HIGH (ref 1.7–7.7)
Neutrophils Relative %: 85 %
Platelets: 325 10*3/uL (ref 150–400)
RBC: 5.18 MIL/uL — ABNORMAL HIGH (ref 3.87–5.11)
RDW: 15 % (ref 11.5–15.5)
WBC: 11.2 10*3/uL — ABNORMAL HIGH (ref 4.0–10.5)
nRBC: 0 % (ref 0.0–0.2)

## 2018-10-08 LAB — SARS CORONAVIRUS 2 BY RT PCR (HOSPITAL ORDER, PERFORMED IN ~~LOC~~ HOSPITAL LAB): SARS Coronavirus 2: NEGATIVE

## 2018-10-08 LAB — URINALYSIS, ROUTINE W REFLEX MICROSCOPIC
Bilirubin Urine: NEGATIVE
Glucose, UA: NEGATIVE mg/dL
Hgb urine dipstick: NEGATIVE
Ketones, ur: NEGATIVE mg/dL
Leukocytes,Ua: NEGATIVE
Nitrite: NEGATIVE
Protein, ur: NEGATIVE mg/dL
Specific Gravity, Urine: 1.011 (ref 1.005–1.030)
pH: 7 (ref 5.0–8.0)

## 2018-10-08 LAB — TROPONIN I (HIGH SENSITIVITY)
Troponin I (High Sensitivity): 24 ng/L — ABNORMAL HIGH (ref <18)
Troponin I (High Sensitivity): 17 ng/L (ref <18)

## 2018-10-08 LAB — GLUCOSE, CAPILLARY
Glucose-Capillary: 105 mg/dL — ABNORMAL HIGH (ref 70–99)
Glucose-Capillary: 127 mg/dL — ABNORMAL HIGH (ref 70–99)

## 2018-10-08 LAB — LIPASE, BLOOD: Lipase: 21 U/L (ref 11–51)

## 2018-10-08 MED ORDER — ACETAMINOPHEN 650 MG RE SUPP: 650.0000 mg | Freq: Four times a day (QID) | RECTAL | Status: DC | PRN

## 2018-10-08 MED ORDER — ONDANSETRON HCL 4 MG/2ML IJ SOLN
4.0000 mg | Freq: Once | INTRAMUSCULAR | Status: AC
  Administered 2018-10-08: 4 mg via INTRAVENOUS
  Filled 2018-10-08: qty 2

## 2018-10-08 MED ORDER — SODIUM CHLORIDE 0.9% FLUSH: 3.0000 mL | Freq: Two times a day (BID) | INTRAVENOUS | Status: DC

## 2018-10-08 MED ORDER — ONDANSETRON HCL 4 MG PO TABS: 4.0000 mg | ORAL_TABLET | Freq: Four times a day (QID) | ORAL | Status: DC | PRN

## 2018-10-08 MED ORDER — IOHEXOL 300 MG/ML  SOLN
100.0000 mL | Freq: Once | INTRAMUSCULAR | Status: AC | PRN
  Administered 2018-10-08: 100 mL via INTRAVENOUS

## 2018-10-08 MED ORDER — SODIUM CHLORIDE 0.9 % IV SOLN: 250.0000 mL | INTRAVENOUS | Status: DC | PRN

## 2018-10-08 MED ORDER — ONDANSETRON HCL 4 MG/2ML IJ SOLN
4.0000 mg | Freq: Four times a day (QID) | INTRAMUSCULAR | Status: DC | PRN
  Administered 2018-10-08 – 2018-10-09 (×2): 4 mg via INTRAVENOUS
  Filled 2018-10-08 (×2): qty 2

## 2018-10-08 MED ORDER — DIATRIZOATE MEGLUMINE & SODIUM 66-10 % PO SOLN
90.0000 mL | Freq: Once | ORAL | Status: AC
  Administered 2018-10-08: 90 mL via ORAL
  Filled 2018-10-08: qty 90

## 2018-10-08 MED ORDER — LABETALOL HCL 5 MG/ML IV SOLN
5.0000 mg | Freq: Once | INTRAVENOUS | Status: AC
  Administered 2018-10-08: 5 mg via INTRAVENOUS
  Filled 2018-10-08: qty 4

## 2018-10-08 MED ORDER — MORPHINE SULFATE (PF) 2 MG/ML IV SOLN
2.0000 mg | INTRAVENOUS | Status: DC | PRN
  Administered 2018-10-08 – 2018-10-09 (×5): 2 mg via INTRAVENOUS
  Filled 2018-10-08 (×5): qty 1

## 2018-10-08 MED ORDER — INSULIN ASPART 100 UNIT/ML ~~LOC~~ SOLN
0.0000 [IU] | Freq: Four times a day (QID) | SUBCUTANEOUS | Status: DC
  Administered 2018-10-09 (×2): 1 [IU] via SUBCUTANEOUS
  Administered 2018-10-10: 2 [IU] via SUBCUTANEOUS

## 2018-10-08 MED ORDER — ACETAMINOPHEN 325 MG PO TABS: 650.0000 mg | ORAL_TABLET | Freq: Four times a day (QID) | ORAL | Status: DC | PRN

## 2018-10-08 MED ORDER — SODIUM CHLORIDE (PF) 0.9 % IJ SOLN
INTRAMUSCULAR | Status: AC
  Filled 2018-10-08: qty 50

## 2018-10-08 MED ORDER — SODIUM CHLORIDE 0.9% FLUSH
3.0000 mL | Freq: Two times a day (BID) | INTRAVENOUS | Status: DC
  Administered 2018-10-10: 22:00:00 3 mL via INTRAVENOUS

## 2018-10-08 MED ORDER — MORPHINE SULFATE (PF) 4 MG/ML IV SOLN
4.0000 mg | Freq: Once | INTRAVENOUS | Status: AC
  Administered 2018-10-08: 4 mg via INTRAVENOUS
  Filled 2018-10-08: qty 1

## 2018-10-08 MED ORDER — SODIUM CHLORIDE 0.9% FLUSH: 3.0000 mL | INTRAVENOUS | Status: DC | PRN

## 2018-10-08 MED ORDER — SODIUM CHLORIDE 0.9 % IV BOLUS
1000.0000 mL | Freq: Once | INTRAVENOUS | Status: AC
  Administered 2018-10-08: 07:00:00 1000 mL via INTRAVENOUS

## 2018-10-08 NOTE — ED Provider Notes (Signed)
[facility] DEPT Provider Note   CSN: [id number] Arrival date & time: 10/08/18  [id number]    History   Chief Complaint Abdominal pain, emesis  HPI Mandy [name] is a 78 y.o. female with past medical history significant for SBO, HTN, Hyperlipidemia, DM who presents for evaluation of abdominal pain and emesis. Pain located to epigastric region. 4 episodes of NBNB emesis. Rates her pain a 5/10.  Began at 10 PM yesterday evening.  Pain is been constant.  Pain does not radiate into back, left arm or jaw.  States she did have a small bowel movement 2 days ago however none since.  Family in the room states blood sugars have been "normal."  They are not able to give me a definitive number.  She is not on insulin.  Was last admitted for early small bowel obstruction 2 months ago.  States pain is not as severe as it was previously.  Denies fever, chills, headache, dizziness, chest pain, shortness of breath, hemoptysis, dysuria, melena, hematochezia.  Denies additional aggravating or alleviating factors.  Has not taken anything for symptoms.  No recent sick contacts.  History obtained from patient, family room past medical records.  Patient declined interpreter.  No prior ABD surgeries.     HPI  Past Medical History:  Diagnosis Date   Diabetes mellitus without complication ([place])    Hyperlipidemia    Hypertension    Osteoporosis    SBO (small bowel obstruction) ([place]) 08/09/2018    Patient Active Problem List   Diagnosis Date Noted   SBO (small bowel obstruction) ([place]) 08/09/2018   Diabetes mellitus type 2 in obese ([place]) 04/07/2008   Dyslipidemia 04/07/2008   Essential hypertension 04/07/2008   SORE THROAT 04/07/2008   GERD 04/07/2008    Past Surgical History:  Procedure Laterality Date   CATARACT EXTRACTION W/ INTRAOCULAR LENS  IMPLANT, BILATERAL     COLONOSCOPY       OB History   No obstetric history on file.      Home Medications     Prior to Admission medications   Medication Sig Start Date End Date Taking? Authorizing Provider  alendronate (FOSAMAX) 70 MG tablet Take 70 mg by mouth once a week. Take with a full glass of water on an empty stomach.    [provider]  amLODipine (NORVASC) 10 MG tablet Take 10 mg by mouth daily.    [provider]  aspirin EC 81 MG EC tablet Take 1 tablet (81 mg total) by mouth daily. 08/11/18 11/09/18  [place] Donnamarie Poag, DO  atenolol (TENORMIN) 50 MG tablet Take 50 mg by mouth 2 (two) times daily.    [provider]  calcium carbonate (OSCAL) 1500 (600 Ca) MG TABS tablet Take 600 mg of elemental calcium by mouth 2 (two) times daily with a meal.    [provider]  gabapentin (NEURONTIN) 100 MG capsule Take 100 mg by mouth at bedtime as needed (sleep).  07/18/18   [provider]  meloxicam (MOBIC) 7.5 MG tablet Take 7.5 mg by mouth 2 (two) times daily as needed for pain.  06/20/18   [provider]  metFORMIN (GLUCOPHAGE) 500 MG tablet Take 500 mg by mouth 2 (two) times daily with a meal.     [provider]  potassium chloride (K-DUR) 10 MEQ tablet Take 10 mEq by mouth daily. 07/19/18   [provider]  simvastatin (ZOCOR) 10 MG tablet Take 10 mg by mouth at bedtime. 04/08/18  [provider]  tiZANidine (ZANAFLEX) 4 MG tablet Take 4 mg by mouth 2 (two) times daily.  07/19/18   [provider]  vitamin C (ASCORBIC ACID) 500 MG tablet Take 500 mg by mouth daily.    [provider]    Family History Family History  Problem Relation Age of Onset   Kidney disease Son    Colon cancer Neg Hx    Breast cancer Neg Hx     Social History Social History   Tobacco Use   Smoking status: Never Smoker   Smokeless tobacco: Never Used  Substance Use Topics   Alcohol use: No   Drug use: No     Allergies   Patient has no known allergies.   Review of Systems Review of Systems   Constitutional: Negative.   HENT: Negative.   Respiratory: Negative.   Cardiovascular: Negative.   Gastrointestinal: Positive for abdominal pain, constipation, nausea and vomiting. Negative for abdominal distention, anal bleeding, blood in stool, diarrhea and rectal pain.  Genitourinary: Negative.   Musculoskeletal: Negative.   Skin: Negative.   Neurological: Negative.   All other systems reviewed and are negative.    Physical Exam Updated Vital Signs BP (!) 174/73    Pulse 66    Temp 97.9 F (36.6 C) (Oral)    Resp 15    Ht 5' (1.524 m)    Wt 72.6 kg    SpO2 (!) 89%    BMI 31.25 kg/m   Physical Exam Vitals signs and nursing note reviewed.  Constitutional:      General: She is not in acute distress.    Appearance: She is well-developed. She is obese. She is not ill-appearing, toxic-appearing or diaphoretic.  HENT:     Head: Normocephalic and atraumatic.     Mouth/Throat:     Pharynx: Oropharynx is clear.     Comments: Dry Eyes:     Extraocular Movements: Extraocular movements intact.     Pupils: Pupils are equal, round, and reactive to light.  Neck:     Musculoskeletal: Normal range of motion.  Cardiovascular:     Rate and Rhythm: Normal rate.     Pulses:          Dorsalis pedis pulses are 2+ on the right side and 2+ on the left side.       Posterior tibial pulses are 2+ on the right side and 2+ on the left side.     Heart sounds: Normal heart sounds. No murmur. No friction rub. No gallop.   Pulmonary:     Effort: No respiratory distress.     Breath sounds: No wheezing.     Comments: Minimal, faint scattered rales throughout.  No accessory muscle usage.  Speaks in full sentences without difficulty. Chest:     Chest wall: No tenderness.  Abdominal:     General: Bowel sounds are normal. There is no distension.     Palpations: Abdomen is soft.     Tenderness: There is generalized abdominal tenderness and tenderness in the epigastric area. There is no right CVA  tenderness, left CVA tenderness, guarding or rebound. Negative signs include Murphy's sign.     Hernia: No hernia is present.     Comments: Diffuse tenderness throughout abdomen worse at epigastric region.  Negative CVA tap bilaterally.  Negative Murphy sign.  Normoactive bowel sounds.  No evidence abdominal wall herniations.  Musculoskeletal: Normal range of motion.     Comments: Moves all 4 extremities without difficulty.  Calves nontender bilaterally without edema, erythema.  Skin:    General: Skin is warm and dry.     Comments: Brisk capillary refill.  No rashes, lesions ecchymosis, edema, warmth or erythema.  Neurological:     General: No focal deficit present.     Mental Status: She is alert.     Comments: Cranial nerves II through XII grossly intact.  No facial droop.  Negative finger-to-nose, negative Romberg, heel-to-shin.    ED Treatments / Results  Labs (all labs ordered are listed, but only abnormal results are displayed) Labs Reviewed  CBC WITH DIFFERENTIAL/PLATELET - Abnormal; Notable for the following components:      Result Value   WBC 11.2 (*)    RBC 5.18 (*)    Neutro Abs 9.4 (*)    All other components within normal limits  COMPREHENSIVE METABOLIC PANEL - Abnormal; Notable for the following components:   Glucose, Bld 203 (*)    Creatinine, Ser 1.23 (*)    Total Protein 8.6 (*)    GFR calc non Af Amer 42 (*)    GFR calc Af Amer 49 (*)    All other components within normal limits  TROPONIN I (HIGH SENSITIVITY) - Abnormal; Notable for the following components:   Troponin I (High Sensitivity) 24 (*)    All other components within normal limits  SARS CORONAVIRUS 2 (HOSPITAL ORDER, [facility])  LIPASE, BLOOD  URINALYSIS, ROUTINE W REFLEX MICROSCOPIC  TROPONIN I (HIGH SENSITIVITY)    EKG EKG Interpretation  Date/Time:  Monday October 08 2018 07:47:36 EDT Ventricular Rate:  86 PR Interval:    QRS Duration: 73 QT Interval:  382 QTC  Calculation: 457 R Axis:   60 Text Interpretation:  Sinus rhythm Minimal ST depression, inferior leads agree, no sig change from previous Confirmed by Charlesetta Shanks [phone]) on 10/08/2018 7:53:45 AM   Radiology Ct Abdomen Pelvis W Contrast  Result Date: 10/08/2018 CLINICAL DATA:  Acute generalized abdominal pain. EXAM: CT ABDOMEN AND PELVIS WITH CONTRAST TECHNIQUE: Multidetector CT imaging of the abdomen and pelvis was performed using the standard protocol following bolus administration of intravenous contrast. CONTRAST:  1106mL OMNIPAQUE IOHEXOL 300 MG/ML  SOLN COMPARISON:  August 09, 2018 FINDINGS: Lower chest: The lung bases are clear. The heart is enlarged. Hepatobiliary: There is decreased hepatic attenuation suggestive of hepatic steatosis. Normal gallbladder.There is no biliary ductal dilation. Pancreas: Normal contours without ductal dilatation. No peripancreatic fluid collection. Spleen: No splenic laceration or hematoma. Adrenals/Urinary Tract: --Adrenal glands: No adrenal hemorrhage. --Right kidney/ureter: Again noted is asymmetrically decreased attenuation of much of the right kidney. The superior most aspect of the kidney is relatively spared. There is developing cortical thinning. There is some significant atherosclerotic disease at the origin of the right renal artery. There is no hydronephrosis. --Left kidney/ureter: No hydronephrosis or perinephric hematoma. --Urinary bladder: Unremarkable. Stomach/Bowel: --Stomach/Duodenum: No hiatal hernia or other gastric abnormality. Normal duodenal course and caliber. --Small bowel: There are multiple dilated loops of small bowel scattered throughout the abdomen. Many of these loops demonstrate air-fluid levels and measure up to approximately 3 cm in diameter. There is a transition point in the low midline abdomen (axial series 2, image 54 and coronal series 6, image 55). There is no pneumatosis. --Colon: There is scattered colonic diverticula without CT  evidence for diverticulitis. --Appendix: Normal. Vascular/Lymphatic: Atherosclerotic calcification is present within the non-aneurysmal abdominal aorta, without hemodynamically significant stenosis. --No retroperitoneal lymphadenopathy. --No mesenteric lymphadenopathy. --No pelvic or inguinal lymphadenopathy.  Reproductive: There is a right adnexal cystic structure measuring approximately 3.3 cm. Other: There is a small amount of free fluid in the patient's pelvis and abdomen, likely reactive to there is no free air. Musculoskeletal. Multilevel degenerative disc disease and facet arthrosis. No bony spinal canal stenosis. IMPRESSION: 1. Moderate to low-grade small-bowel obstruction with a transition point in the low midline pelvis as detailed above. 2. Again noted is asymmetrically decreased attenuation of the right kidney concerning for vascular compromise. There is some developing cortical thinning of the interpolar and lower pole areas of the right kidney. 3. Small amount of free fluid in the patient's abdomen and pelvis, likely reactive. 4. There is a 3.3 cm cystic structure involving the right ovary. Follow-up with a nonemergent outpatient ultrasound is recommended. 5. Hepatic steatosis. Aortic Atherosclerosis (ICD10-I70.0). Electronically Signed   By: Constance Holster M.D.   On: 10/08/2018 09:48    Procedures Procedures (including critical care time)  Medications Ordered in ED Medications  sodium chloride (PF) 0.9 % injection (has no administration in time range)  diatrizoate meglumine-sodium (GASTROGRAFIN) 66-10 % solution 90 mL (has no administration in time range)  morphine 4 MG/ML injection 4 mg (4 mg Intravenous Given 10/08/18 0723)  ondansetron (ZOFRAN) injection 4 mg (4 mg Intravenous Given 10/08/18 0723)  sodium chloride 0.9 % bolus 1,000 mL (1,000 mLs Intravenous New Bag/Given 10/08/18 0723)  iohexol (OMNIPAQUE) 300 MG/ML solution 100 mL (100 mLs Intravenous Contrast Given 10/08/18 0900)     Initial Impression / Assessment and Plan / ED Course  I have reviewed the triage vital signs and the nursing notes.  Pertinent labs & imaging results that were available during my care of the patient were reviewed by me and considered in my medical decision making (see chart for details).  78 year old female appears otherwise well presents for evaluation epigastric abdominal pain onset yesterday evening at 10 PM.  Multiple episodes of NBNB emesis.  Denies any chest pain, shortness of breath.  Heart clear.  She does have minimally, faint rales throughout.  Speaks in full sentences.  Her abdomen is diffusely tender palpation however worse to epigastric region.  She has a negative Murphy sign.  Normoactive bowel sounds.  Moves all 4 extremities without difficulty.  Daughter in room states she has been constipated and had a bowel movement 2 days.  No melena, hematochezia or hemoptysis.  She was recently admitted 2 months ago for early developing small bowel obstruction and possible renal infarct.  Will obtain labs, imaging, provide pain management, antiemetics, IV fluids and reevaluate.  Labs and imaging personally reviewed:  CBC with leukocytosis at 11.2 CMP elevated glucose at 203, creatinine 1.23 improved from previous at 1.41, GFR 49, improved from previous at 42 Lipase 21 Troponin 24 however down from previous at 37,31 on previous admission Urinalysis EKG with minimal ST depression however similar to previous EKG. No STEMI CT AP SBO with transition point. Again noted decreased attenuation to right kidney.  [id number]: On reevaluation patient sleeping soundly, arousable by voice.  Abdomen soft without rebound or guarding.  No episodes of emesis since arrival to the ED. Family updated on labs at baseline.  Awaiting CT scan.  Will continue to monitor.  1000: Revaluation pain controlled. No emesis in ED. CT shows Moderate grade SBO with transition point. Given no emesis will hold on NG tube until  surgery evaluate. Also decreased attenuation on CT of right kidney. Vascular consulted on last admission with no critical stenosis. Family opted for conservative management.  1027: Consulted with [facility] general surgery who will evaluate patient in consult with Hospitalist admission. Discussed with patient and family in room. In agreement with admission for SBO.  Discussed with attending provider, Dr. Vallery Ridge given decreased attenuation of right kidney which she had on previous CT scan.  Does not feel we need to reconsult vascular surgery given patent ultrasound previously and family not wanting surgical intervention at that time.  1100: Consulted with Dr. Sarajane Jews with [facility] who will evaluate patient for admission. Pending repeat trop however at baseline.  Patient with intermittent vital signs with hypoxia into theupper 80s when she sleeps.  On my evaluation patient has had oxygen saturation in mid 90s.  No formal diagnosis of sleep apnea however likely. She denies CP, SOB, Cough to suggest underlying, ACS, PE, dissection, infectious process as cause. COVID pending. EKG without ischemic changes.   Patient has been discussed with attending physician, Dr. Vallery Ridge who agrees with above treatment, plan and disposition.     The patient appears reasonably stabilized for admission considering the current resources, flow, and capabilities available in the ED at this time, and I doubt any other [place] requiring further screening and/or treatment in the ED prior to admission. Final Clinical Impressions(s) / ED Diagnoses   Final diagnoses:  SBO (small bowel obstruction) ([place])  Elevated troponin    ED Discharge Orders    None       Mikala Podoll A, PA-C 10/08/18 1106    Charlesetta Shanks, MD 10/09/18 [phone]

## 2018-10-08 NOTE — Progress Notes (Signed)
On call provider notified of BP 189/86, 175/87, and HR 88. Patient is calm and no signs of distress noted at this time. New orders received, will follow through with orders for one time dose of labetalol 5mg .   Norlene Duel RN, BSN

## 2018-10-08 NOTE — Consult Note (Signed)
Mandy [name] 29-Oct-1940  UT:[id number].    Requesting MD: Morton Amy PA-C Chief Complaint/Reason for Consult: Abdominal pain, N/V, SBO on CT  HPI: Mandy [name] is a 78 y.o. female with a history of HTN, HLD, DM who presented to the hospital for abdominal pain, nausea, vomiting.  Patient's daughter is at bedside and translates.  She was offered a [employer] interpreter but deferred.  Patient reportedly laid down around 10 PM last night when she began having mild, epigastric cramping.  She woke up in the middle the night and had an episode of small, nonbilious, nonbloody emesis.  She has had 4 episodes of emesis since onset, with the last around 4-5 AM this morning.  She presented to the ED for evaluation.  She underwent a CT scan the abdomen pelvis that showed a low-grade small bowel obstruction with a transition point in the low midline pelvis and fecalization of the small bowel.  It also demonstrated a decreased attenuation of the right kidney that was concerning for vascular compromise.  WBC 11.2.  Creatinine 1.23.  Glucose 203.  Potassium 3.8.  General surgery was consulted.  Patient currently is without any abdominal pain or nausea.  She states her last bowel movement was 2 days ago, small and hard.  She does report that she frequently suffers from constipation.  Her daughter believes she does have a small BM almost every day however.  The patient does not take any MiraLAX, stool softeners etc. at home.  She is still passing flatus.  No prior abdominal surgeries.  She denies prior appendicitis, diverticulitis, colitis etc. that would lead to adhesions.  Patient was admitted back on 8/6 with similar presentation that resolved conservatively. Patient lives at home with her daughter.   ROS: Review of Systems  Constitutional: Negative for chills and fever.  Respiratory: Negative for cough and shortness of breath.   Cardiovascular: Negative for chest pain.  Gastrointestinal: Positive for  abdominal pain, constipation, nausea and vomiting. Negative for diarrhea and heartburn.  Genitourinary: Negative for dysuria.  Skin: Negative for rash.  All other systems reviewed and are negative.   Family History  Problem Relation Age of Onset   Kidney disease Son    Colon cancer Neg Hx    Breast cancer Neg Hx     Past Medical History:  Diagnosis Date   Diabetes mellitus without complication ([place])    Hyperlipidemia    Hypertension    Osteoporosis    SBO (small bowel obstruction) ([place]) 08/09/2018    Past Surgical History:  Procedure Laterality Date   CATARACT EXTRACTION W/ INTRAOCULAR LENS  IMPLANT, BILATERAL     COLONOSCOPY      Social History:  reports that she has never smoked. She has never used smokeless tobacco. She reports that she does not drink alcohol or use drugs.  Allergies: No Known Allergies  (Not in a hospital admission)    Physical Exam: Blood pressure (!) 174/73, pulse 66, temperature 97.9 F (36.6 C), temperature source Oral, resp. rate 15, height 5' (1.524 m), weight 72.6 kg, SpO2 (!) 89 %. General: pleasant, WD, WN female who is laying in bed in NAD HEENT: head is normocephalic, atraumatic.  Sclera are noninjected.  PERRL.  Ears and nose without any masses or lesions.  Mask over face Heart: regular, rate, and rhythm.  Normal s1,s2. No obvious murmurs, gallops, or rubs noted.  Palpable radial and pedal pulses bilaterally Lungs: CTAB, no wheezes, rhonchi, or rales noted.  Respiratory effort nonlabored  Abd: Soft, NT, ND, +BS, no masses, hernias, or organomegaly. No prior abdominal scars. MS: all 4 extremities are symmetrical with no cyanosis, clubbing, or edema. Skin: warm and dry with no masses, lesions, or rashes Neuro: A&Ox3. Moves all extremities.  Psych: Appropriate affect.   Results for orders placed or performed during the hospital encounter of 10/08/18 (from the past 48 hour(s))  CBC with Differential     Status: Abnormal    Collection Time: 10/08/18  7:20 AM  Result Value Ref Range   WBC 11.2 (H) 4.0 - 10.5 K/uL   RBC 5.18 (H) 3.87 - 5.11 MIL/uL   Hemoglobin 14.1 12.0 - 15.0 g/dL   HCT 43.6 36.0 - 46.0 %   MCV 84.2 80.0 - 100.0 fL   MCH 27.2 26.0 - 34.0 pg   MCHC 32.3 30.0 - 36.0 g/dL   RDW 15.0 11.5 - 15.5 %   Platelets 325 150 - 400 K/uL   nRBC 0.0 0.0 - 0.2 %   Neutrophils Relative % 85 %   Neutro Abs 9.4 (H) 1.7 - 7.7 K/uL   Lymphocytes Relative 11 %   Lymphs Abs 1.3 0.7 - 4.0 K/uL   Monocytes Relative 2 %   Monocytes Absolute 0.2 0.1 - 1.0 K/uL   Eosinophils Relative 2 %   Eosinophils Absolute 0.3 0.0 - 0.5 K/uL   Basophils Relative 0 %   Basophils Absolute 0.0 0.0 - 0.1 K/uL   Immature Granulocytes 0 %   Abs Immature Granulocytes 0.04 0.00 - 0.07 K/uL    Comment: Performed at [facility], [place] [address], [place], [place] [address]  Comprehensive metabolic panel     Status: Abnormal   Collection Time: 10/08/18  7:20 AM  Result Value Ref Range   Sodium 136 135 - 145 mmol/L   Potassium 3.8 3.5 - 5.1 mmol/L   Chloride 98 98 - 111 mmol/L   CO2 27 22 - 32 mmol/L   Glucose, Bld 203 (H) 70 - 99 mg/dL   BUN 19 8 - 23 mg/dL   Creatinine, Ser 1.23 (H) 0.44 - 1.00 mg/dL   Calcium 9.7 8.9 - 10.3 mg/dL   Total Protein 8.6 (H) 6.5 - 8.1 g/dL   Albumin 4.5 3.5 - 5.0 g/dL   AST 27 15 - 41 U/L   ALT 16 0 - 44 U/L   Alkaline Phosphatase 46 38 - 126 U/L   Total Bilirubin 0.4 0.3 - 1.2 mg/dL   GFR calc non Af Amer 42 (L) >60 mL/min   GFR calc Af Amer 49 (L) >60 mL/min   Anion gap 11 5 - 15    Comment: Performed at [facility], [place] [address], [place], [place] [address]  Lipase, blood     Status: None   Collection Time: 10/08/18  7:20 AM  Result Value Ref Range   Lipase 21 11 - 51 U/L    Comment: Performed at [facility], [place] [address], [place], [place] [address]  Troponin I (High Sensitivity)     Status: Abnormal   Collection Time:  10/08/18  7:20 AM  Result Value Ref Range   Troponin I (High Sensitivity) 24 (H) <18 ng/L    Comment: (NOTE) Elevated high sensitivity troponin I (hsTnI) values and significant  changes across serial measurements may suggest ACS but many other  chronic and acute conditions are known to elevate hsTnI results.  Refer to the "Links" section for chest pain algorithms and additional  guidance. Performed at [facility], [place] [address], [place], [place] [address]   Urinalysis, Routine w reflex microscopic     Status: None   Collection Time: 10/08/18  9:43 AM  Result Value Ref Range   Color, Urine YELLOW YELLOW   APPearance CLEAR CLEAR   Specific Gravity, Urine 1.011 1.005 - 1.030   pH 7.0 5.0 - 8.0   Glucose, UA NEGATIVE NEGATIVE mg/dL   Hgb urine dipstick NEGATIVE NEGATIVE   Bilirubin Urine NEGATIVE NEGATIVE   Ketones, ur NEGATIVE NEGATIVE mg/dL   Protein, ur NEGATIVE NEGATIVE mg/dL   Nitrite NEGATIVE NEGATIVE   Leukocytes,Ua NEGATIVE NEGATIVE    Comment: Performed at [place] [name] [place], [place] [address]   Ct Abdomen Pelvis W Contrast  Result Date: 10/08/2018 CLINICAL DATA:  Acute generalized abdominal pain. EXAM: CT ABDOMEN AND PELVIS WITH CONTRAST TECHNIQUE: Multidetector CT imaging of the abdomen and pelvis was performed using the standard protocol following bolus administration of intravenous contrast. CONTRAST:  193mL OMNIPAQUE IOHEXOL 300 MG/ML  SOLN COMPARISON:  August 09, 2018 FINDINGS: Lower chest: The lung bases are clear. The heart is enlarged. Hepatobiliary: There is decreased hepatic attenuation suggestive of hepatic steatosis. Normal gallbladder.There is no biliary ductal dilation. Pancreas: Normal contours without ductal dilatation. No peripancreatic fluid collection. Spleen: No splenic laceration or hematoma. Adrenals/Urinary Tract: --Adrenal glands: No adrenal hemorrhage. --Right kidney/ureter: Again noted is  asymmetrically decreased attenuation of much of the right kidney. The superior most aspect of the kidney is relatively spared. There is developing cortical thinning. There is some significant atherosclerotic disease at the origin of the right renal artery. There is no hydronephrosis. --Left kidney/ureter: No hydronephrosis or perinephric hematoma. --Urinary bladder: Unremarkable. Stomach/Bowel: --Stomach/Duodenum: No hiatal hernia or other gastric abnormality. Normal duodenal course and caliber. --Small bowel: There are multiple dilated loops of small bowel scattered throughout the abdomen. Many of these loops demonstrate air-fluid levels and measure up to approximately 3 cm in diameter. There is a transition point in the low midline abdomen (axial series 2, image 54 and coronal series 6, image 55). There is no pneumatosis. --Colon: There is scattered colonic diverticula without CT evidence for diverticulitis. --Appendix: Normal. Vascular/Lymphatic: Atherosclerotic calcification is present within the non-aneurysmal abdominal aorta, without hemodynamically significant stenosis. --No retroperitoneal lymphadenopathy. --No mesenteric lymphadenopathy. --No pelvic or inguinal lymphadenopathy. Reproductive: There is a right adnexal cystic structure measuring approximately 3.3 cm. Other: There is a small amount of free fluid in the patient's pelvis and abdomen, likely reactive to there is no free air. Musculoskeletal. Multilevel degenerative disc disease and facet arthrosis. No bony spinal canal stenosis. IMPRESSION: 1. Moderate to low-grade small-bowel obstruction with a transition point in the low midline pelvis as detailed above. 2. Again noted is asymmetrically decreased attenuation of the right kidney concerning for vascular compromise. There is some developing cortical thinning of the interpolar and lower pole areas of the right kidney. 3. Small amount of free fluid in the patient's abdomen and pelvis, likely  reactive. 4. There is a 3.3 cm cystic structure involving the right ovary. Follow-up with a nonemergent outpatient ultrasound is recommended. 5. Hepatic steatosis. Aortic Atherosclerosis (ICD10-I70.0). Electronically Signed   By: Constance Holster M.D.   On: 10/08/2018 09:48   Antibiotics Given (last 72 hours)    None      Assessment/Plan HTN HLD DM Decreased attenuation of right kidney on CT - Above per medicine -   SBO  - CT Moderate to low-grade  small-bowel obstruction with a transition point in the low midline pelvis and fecalization of small bowel  - She is currently asymptomatic. Would hold off on NGT. Will do oral SBO protocol as gastrografin will be able to follow radiographically if this is resolving and have a laxative effect.  - If patient develops N/V, abdominal pain or distension can insert NGT at that time - Keep Mg >2 and K >4 for bowel function - Ambulate for bowel function - Limit narcotics if able as may worsen constipation  FEN - NPO, IVF VTE - SCDs, okay for chemical prophylaxis from a general surgery standpoint ID - None  Jillyn Ledger, [facility] Surgery 10/08/2018, 10:58 AM Pager: [phone]

## 2018-10-08 NOTE — ED Notes (Signed)
Report given to Hannah,RN

## 2018-10-08 NOTE — Progress Notes (Signed)
Called radiology to let them know that pt finished drinking gastrografin.

## 2018-10-08 NOTE — ED Triage Notes (Signed)
Pt complaint of abdominal pain with n/v onset 1000 PM last night; recent hospitalization for SBO.   Per daughter she had BM 2 days ago; small on this am but not regular.

## 2018-10-08 NOTE — H&P (Addendum)
History and Physical  Mandy [name] [id number] DOB: [birth year] DOA: 10/08/2018  PCP: [name], MD   Chief Complaint: abdominal pain  HPI:  78 year old woman PMH hyperlipidemia, diabetes mellitus type 2 with diabetic neuropathy, osteoporosis, small bowel obstruction PRESENTED to the [facility] emergency department for abdominal pain.  Admitted for small bowel obstruction.  History obtained from daughter at bedside who clarified's translation (in the past and this time she has requested not to use a Optometrist).  Patient began last night 10 PM having abdominal pain, nausea and vomiting.  Symptoms progressed without specific aggravating or alleviating factors or associated symptoms.  Came to the emergency department for further evaluation.  Chart review: . Hospitalization 08/2018 for small bowel obstruction and right renal ischemia.  Seen by general surgery and treated conservatively with spontaneous resolution of bowel obstruction.  Vascular surgery consulted and bilateral renal artery duplex did not show any critical stenosis.  No further intervention was recommended by vascular surgery.  Can follow-up as an outpatient with Dr. Carlis Abbott.  ED Course: Afebrile, hypertensive, no hypoxia while awake but hypoxia while asleep.  EDP suspect sleep apnea.  Treated with morphine 4 mg, Zofran, 1 L normal saline  Review of Systems:  No fever, COVID exposure, changes to vision, sore throat, rash, new muscle aches, chest pain, shortness of breath, dysuria, bleeding  Positive for small bowel movement today  PMH . Essential hypertension . Hyperlipidemia . Diabetes mellitus type 2 with diabetic neuropathy . Osteoporosis  PSH . Bilateral cataracts  Social History . No alcohol, no drugs, no smoking . Lives with daughter, uses a walker, no recent falls . From [place], speaks [place]  Allergies . None  Family history includes: . Son with kidney problems   Meds include: . Fosamax .  Amlodipine 10 mg daily . Aspirin 81 mg daily . Atenolol 50 mg daily . Gabapentin 100 mg nightly . Metformin 500 mg p.o. twice daily . Simvastatin 10 mg at night . Zanaflex 2 times daily . Multiple vitamins  Physical Exam: Vitals: 97.9, 17, 89, 165/97, 94% on room air .   Constitutional:   . Appears calm and comfortable lying in bed Eyes:  . pupils and irises appear normal . Normal lids  ENMT:  . grossly normal hearing  . Lips appear normal Neck:  . neck appears normal, no masses . no thyromegaly Respiratory:  . CTA bilaterally, no w/r/r.  . Respiratory effort normal.  Cardiovascular:  . RRR, no m/r/g . No LE extremity edema   Abdomen:  . Abdomen appears normal; no tenderness or masses . No hernias Musculoskeletal:  . RUE, LUE, RLE, LLE   . Appear grossly unremarkable Psychiatric:  . Mental status o Mood, affect appropriate  I have personally reviewed following labs and imaging studies  Labs:  Marland Kitchen BMP unremarkable with creatinine of 1.23, improved compared to previous of 1.41.  LFTs unremarkable. . High-sensitivity troponin of 24, unclear why this was obtained.  In August it was 31, 84. . WBC 11.2, hemoglobin 14.1, platelets within normal limits . Urinalysis negative  Medical tests:   EKG independently reviewed: Sinus rhythm, no acute changes  Significant Hospital Events   . 10/5 admitted for SBO   Consults:  . General surgery   Procedures:  .   Significant Diagnostic Tests:   10/5 CT abdomen pelvis moderate to low-grade small bowel obstruction, decreased attenuation right kidney concerning for vascular compromise, 3.3 cm cystic structure right ovary.   10/5 SARS-CoV-2   Micro Data:  .  Antimicrobials:     Assessment/Plan 78 year old woman presents with small bowel obstruction.  She appears quite stable at this time, plan admission to medical bed, management and general per surgery.  Comorbidities appear stable.  Small bowel obstruction  --Appears comfortable, hemodynamic stable, no fever, minimal leukocytosis. --Admit to medical bed.  Surgical consultation pending.  Management per surgery.  Abnormal appearance of the right kidney on CT --Previously evaluated with renal artery duplex and vascular surgery consultation.  Vascular surgery recommended no further evaluation.  Renal function stable.  No further evaluation suggested.  Diabetes mellitus type 2 on metformin, essential hypertension, hyperlipidemia --Appear stable.  Sliding scale insulin. --Resume antihypertensives when able to take p.o. --Resume statin when able to take p.o. --Resume metformin on discharge  Suspected sleep apnea --Outpatient sleep study recommended  3.3 cm cystic structure right ovary --Outpatient nonemergent ultrasound recommended  Hepatic steatosis --LFTs within normal limits.  Follow-up as an outpatient.  Aortic atherosclerosis --Continue statin   DVT prophylaxis: SCDs Code Status: Full Family Communication: daughter at bedside Consults called: general surgery    Time spent: 22 minutes  Murray Hodgkins, MD  [facility] Direct contact: see [email or web address]  7PM-7AM contact night coverage as below   1. Check the care team in [facility] and look for a) attending/consulting [facility] provider listed and b) the [facility] team listed 2. Log into [email or web address] and use [place]'s universal password to access. If you do not have the password, please contact the hospital operator. 3. Locate the [facility] provider you are looking for under [facility] and page to a number that you can be directly reached. 4. If you still have difficulty reaching the provider, please page the [facility] (Director on Call) for the Hospitalists listed on amion for assistance.  Severity of Illness: The appropriate patient status for this patient is INPATIENT. Inpatient status is judged to be reasonable and necessary in order to provide the required intensity of service to ensure  the patient's safety. The patient's presenting symptoms, physical exam findings, and initial radiographic and laboratory data in the context of their chronic comorbidities is felt to place them at high risk for further clinical deterioration. Furthermore, it is not anticipated that the patient will be medically stable for discharge from the hospital within 2 midnights of admission. The following factors support the patient status of inpatient.   " The patient's presenting symptoms include abdominal pain, vomiting. " The worrisome physical exam findings include benign exam. " The initial radiographic and laboratory data are worrisome because of small bowel obstruction. " The chronic co-morbidities include diabetes mellitus type 2, hyperlipidemia, essential hypertension.   * I certify that at the point of admission it is my clinical judgment that the patient will require inpatient hospital care spanning beyond 2 midnights from the point of admission due to high intensity of service, high risk for further deterioration and high frequency of surveillance required.*   10/08/2018, 11:44 AM   Principal Problem:   SBO (small bowel obstruction) ([facility]) Active Problems:   Diabetes mellitus type 2 in obese ([place])   Dyslipidemia   Essential hypertension   Mass of right ovary   Hepatic steatosis   Aortic atherosclerosis ([place])

## 2018-10-09 ENCOUNTER — Inpatient Hospital Stay (HOSPITAL_COMMUNITY): Payer: Medicare HMO

## 2018-10-09 LAB — CBC
HCT: 39 % (ref 36.0–46.0)
Hemoglobin: 12.4 g/dL (ref 12.0–15.0)
MCH: 27.2 pg (ref 26.0–34.0)
MCHC: 31.8 g/dL (ref 30.0–36.0)
MCV: 85.5 fL (ref 80.0–100.0)
Platelets: 279 10*3/uL (ref 150–400)
RBC: 4.56 MIL/uL (ref 3.87–5.11)
RDW: 15.4 % (ref 11.5–15.5)
WBC: 8 10*3/uL (ref 4.0–10.5)
nRBC: 0 % (ref 0.0–0.2)

## 2018-10-09 LAB — BASIC METABOLIC PANEL
Anion gap: 11 (ref 5–15)
BUN: 22 mg/dL (ref 8–23)
CO2: 28 mmol/L (ref 22–32)
Calcium: 9.2 mg/dL (ref 8.9–10.3)
Chloride: 103 mmol/L (ref 98–111)
Creatinine, Ser: 1.38 mg/dL — ABNORMAL HIGH (ref 0.44–1.00)
GFR calc Af Amer: 43 mL/min — ABNORMAL LOW (ref >60)
GFR calc non Af Amer: 37 mL/min — ABNORMAL LOW (ref >60)
Glucose, Bld: 122 mg/dL — ABNORMAL HIGH (ref 70–99)
Potassium: 4 mmol/L (ref 3.5–5.1)
Sodium: 142 mmol/L (ref 135–145)

## 2018-10-09 LAB — MAGNESIUM: Magnesium: 2.6 mg/dL — ABNORMAL HIGH (ref 1.7–2.4)

## 2018-10-09 LAB — GLUCOSE, CAPILLARY
Glucose-Capillary: 101 mg/dL — ABNORMAL HIGH (ref 70–99)
Glucose-Capillary: 121 mg/dL — ABNORMAL HIGH (ref 70–99)
Glucose-Capillary: 84 mg/dL (ref 70–99)
Glucose-Capillary: 91 mg/dL (ref 70–99)

## 2018-10-09 MED ORDER — METOPROLOL TARTRATE 5 MG/5ML IV SOLN: 5.0000 mg | Freq: Four times a day (QID) | INTRAVENOUS | Status: DC | PRN

## 2018-10-09 NOTE — Progress Notes (Signed)
Subjective/Chief Complaint: Some nausea overnight and abd pain No BM   Objective: Vital signs in last 24 hours: Temp:  [98.9 F (37.2 C)-99.5 F (37.5 C)] 98.9 F (37.2 C) (10/06 0444) Pulse Rate:  [66-97] 93 (10/06 0444) Resp:  [15-21] 18 (10/06 0444) BP: (145-193)/(67-106) 157/98 (10/06 0444) SpO2:  [83 %-96 %] 88 % (10/06 0444) Last BM Date: 10/08/18  Intake/Output from previous day: 10/05 0701 - 10/06 0700 In: 1080 [I.V.:80; IV Piggyback:1000] Out: 600 [Urine:600] Intake/Output this shift: No intake/output data recorded.  Constitutional: No acute distress, conversant, appears states age. Eyes: Anicteric sclerae, moist conjunctiva, no lid lag Lungs: Clear to auscultation bilaterally, normal respiratory effort CV: regular rate and rhythm, no murmurs, no peripheral edema, pedal pulses 2+ GI: Soft, no masses or hepatosplenomegaly, non-tender to palpation, no distention, no rebound/guarding Skin: No rashes, palpation reveals normal turgor Psychiatric: appropriate judgment and insight, oriented to person, place, and time   Lab Results:  Recent Labs    10/08/18 0720 10/09/18 0338  WBC 11.2* 8.0  HGB 14.1 12.4  HCT 43.6 39.0  PLT 325 279   BMET Recent Labs    10/08/18 0720 10/09/18 0338  NA 136 142  K 3.8 4.0  CL 98 103  CO2 27 28  GLUCOSE 203* 122*  BUN 19 22  CREATININE 1.23* 1.38*  CALCIUM 9.7 9.2   PT/INR No results for input(s): LABPROT, INR in the last 72 hours. ABG No results for input(s): PHART, HCO3 in the last 72 hours.  Invalid input(s): PCO2, PO2  Studies/Results: Ct Abdomen Pelvis W Contrast  Result Date: 10/08/2018 CLINICAL DATA:  Acute generalized abdominal pain. EXAM: CT ABDOMEN AND PELVIS WITH CONTRAST TECHNIQUE: Multidetector CT imaging of the abdomen and pelvis was performed using the standard protocol following bolus administration of intravenous contrast. CONTRAST:  16mL OMNIPAQUE IOHEXOL 300 MG/ML  SOLN COMPARISON:  August 09, 2018 FINDINGS: Lower chest: The lung bases are clear. The heart is enlarged. Hepatobiliary: There is decreased hepatic attenuation suggestive of hepatic steatosis. Normal gallbladder.There is no biliary ductal dilation. Pancreas: Normal contours without ductal dilatation. No peripancreatic fluid collection. Spleen: No splenic laceration or hematoma. Adrenals/Urinary Tract: --Adrenal glands: No adrenal hemorrhage. --Right kidney/ureter: Again noted is asymmetrically decreased attenuation of much of the right kidney. The superior most aspect of the kidney is relatively spared. There is developing cortical thinning. There is some significant atherosclerotic disease at the origin of the right renal artery. There is no hydronephrosis. --Left kidney/ureter: No hydronephrosis or perinephric hematoma. --Urinary bladder: Unremarkable. Stomach/Bowel: --Stomach/Duodenum: No hiatal hernia or other gastric abnormality. Normal duodenal course and caliber. --Small bowel: There are multiple dilated loops of small bowel scattered throughout the abdomen. Many of these loops demonstrate air-fluid levels and measure up to approximately 3 cm in diameter. There is a transition point in the low midline abdomen (axial series 2, image 54 and coronal series 6, image 55). There is no pneumatosis. --Colon: There is scattered colonic diverticula without CT evidence for diverticulitis. --Appendix: Normal. Vascular/Lymphatic: Atherosclerotic calcification is present within the non-aneurysmal abdominal aorta, without hemodynamically significant stenosis. --No retroperitoneal lymphadenopathy. --No mesenteric lymphadenopathy. --No pelvic or inguinal lymphadenopathy. Reproductive: There is a right adnexal cystic structure measuring approximately 3.3 cm. Other: There is a small amount of free fluid in the patient's pelvis and abdomen, likely reactive to there is no free air. Musculoskeletal. Multilevel degenerative disc disease and facet arthrosis. No  bony spinal canal stenosis. IMPRESSION: 1. Moderate to low-grade small-bowel obstruction with a  transition point in the low midline pelvis as detailed above. 2. Again noted is asymmetrically decreased attenuation of the right kidney concerning for vascular compromise. There is some developing cortical thinning of the interpolar and lower pole areas of the right kidney. 3. Small amount of free fluid in the patient's abdomen and pelvis, likely reactive. 4. There is a 3.3 cm cystic structure involving the right ovary. Follow-up with a nonemergent outpatient ultrasound is recommended. 5. Hepatic steatosis. Aortic Atherosclerosis (ICD10-I70.0). Electronically Signed   By: Constance Holster M.D.   On: 10/08/2018 09:48   Dg Abd Portable 1v-small Bowel Obstruction Protocol-initial, 8 Hr Delay  Result Date: 10/08/2018 CLINICAL DATA:  8 hour delayed small bowel film. EXAM: PORTABLE ABDOMEN - 1 VIEW COMPARISON:  CT scan, same date. FINDINGS: Patient was given oral contrast 8 hours prior to this abdominal film. There is moderate residual contrast in the stomach. There is progression however of contrast into the distal ileal loops of small bowel and possibly just into the cecum. The bowel loops are not significantly dilated measuring less than 3 cm. No free air. IMPRESSION: Slow progression of contrast throughout the small bowel and probably just into the cecum. Electronically Signed   By: Marijo Sanes M.D.   On: 10/08/2018 21:17    Anti-infectives: Anti-infectives (From admission, onward)   None      Assessment/Plan: HTN HLD DM Decreased attenuation of right kidney on CT - Above per medicine -   SBO  - CT Moderate to low-grade small-bowel obstruction with a transition point in the low midline pelvis and fecalization of small bowel  -con't NPO--appears some contrast within cecum.  Await bowel function.  IF BM today OK for PO trial - Keep Mg >2 and K >4 for bowel function - Ambulate for bowel  function - Limit narcotics if able as may worsen constipation  FEN - NPO, IVF VTE - SCDs, okay for chemical prophylaxis from a general surgery standpoint ID - None  LOS: 1 day    Ralene Ok 10/09/2018

## 2018-10-09 NOTE — Progress Notes (Signed)
Hospitalist progress note  Mandy [name]  [id number] DOB: Sep 22, 1940 DOA: 10/08/2018 PCP: [name], MD  Narrative:  78 year old African-American female HLD, DM TY 2+ neuropathy + nephropathy, osteoporosis Recent admit 8/6-8/7 SBO,?  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-creatinine slightly up but baseline seems to be 1.2-1.3 range-monitor  DVT prophylaxis: Lovenox code Status:   Full   family Communication:   None present disposition Plan: Inpatient pending resolution  Consultants:   General surgery none  Procedures:   None  Antimicrobials:   None currently  Subjective: Awake alert coherent somewhat subdued-no flatus, no stool: No vomit, no nausea  Objective: Vitals:   10/08/18 2051 10/08/18 2109 10/08/18 2241 10/09/18 0444  BP: (!) 189/86 (!) 175/87 (!) 145/71 (!) 157/98  Pulse: 97 88 85 93  Resp: 20   18  Temp: 99.5 F (37.5 C)   98.9 F (37.2 C)  TempSrc: Oral   Oral  SpO2: 94%   (!) 88%  Weight:      Height:        Intake/Output Summary (Last 24 hours) at 10/09/2018 0816 Last data filed at 10/09/2018 0400 Gross per 24 hour  Intake 1080 ml  Output 600 ml  Net 480 ml   Filed Weights   10/08/18 0658  Weight: 72.6 kg    Examination: EOMI NCAT no focal deficit thick neck Mallampati 2-3 Chest clinically clear no added sound no rales no rhonchi Abdomen obese nontender slightly distended No lower extremity edema Neurologically intact no focal  deficit Skin soft supple no lower extremity edema   Data Reviewed: I have personally reviewed following labs and imaging studies  CBC: Recent Labs  Lab 10/08/18 0720 10/09/18 0338  WBC 11.2* 8.0  NEUTROABS 9.4*  --   HGB 14.1 12.4  HCT 43.6 39.0  MCV 84.2 85.5  PLT 325 [id number]   Basic Metabolic Panel: Recent Labs  Lab 10/08/18 0720 10/09/18 0338  NA 136 142  K 3.8 4.0  CL 98 103  CO2 27 28  GLUCOSE 203* 122*  BUN 19 22  CREATININE 1.23* 1.38*  CALCIUM 9.7 9.2  MG  --  2.6*   GFR: Estimated Creatinine Clearance: 30.3 mL/min (A) (by C-G formula based on SCr of 1.38 mg/dL (H)). Liver Function Tests: Recent Labs  Lab 10/08/18 0720  AST 27  ALT 16  ALKPHOS 46  BILITOT 0.4  PROT 8.6*  ALBUMIN 4.5   Recent Labs  Lab 10/08/18 0720  LIPASE 21   No results for input(s): AMMONIA in the last 168 hours. Coagulation Profile: No results for input(s): INR, PROTIME in the last 168 hours. Cardiac Enzymes:  Radiology Studies: Reviewed images personally in health database   Scheduled Meds: . insulin aspart  0-9 Units Subcutaneous Q6H  . sodium chloride flush  3 mL Intravenous Q12H  . sodium chloride flush  3 mL Intravenous Q12H   Continuous Infusions: . sodium chloride       LOS: 1 day    Time spent: [place], MD [facility]  If 7PM-7AM, please contact night-coverage-look on AMION  to find my number otherwise-prefer pages-not epic chat,please 10/09/2018, 8:16 AM

## 2018-10-10 LAB — BASIC METABOLIC PANEL
Anion gap: 12 (ref 5–15)
BUN: 28 mg/dL — ABNORMAL HIGH (ref 8–23)
CO2: 27 mmol/L (ref 22–32)
Calcium: 9 mg/dL (ref 8.9–10.3)
Chloride: 103 mmol/L (ref 98–111)
Creatinine, Ser: 1.43 mg/dL — ABNORMAL HIGH (ref 0.44–1.00)
GFR calc Af Amer: 41 mL/min — ABNORMAL LOW (ref >60)
GFR calc non Af Amer: 35 mL/min — ABNORMAL LOW (ref >60)
Glucose, Bld: 91 mg/dL (ref 70–99)
Potassium: 3.7 mmol/L (ref 3.5–5.1)
Sodium: 142 mmol/L (ref 135–145)

## 2018-10-10 LAB — CBC WITH DIFFERENTIAL/PLATELET
Abs Immature Granulocytes: 0.02 10*3/uL (ref 0.00–0.07)
Basophils Absolute: 0 10*3/uL (ref 0.0–0.1)
Basophils Relative: 0 %
Eosinophils Absolute: 0.5 10*3/uL (ref 0.0–0.5)
Eosinophils Relative: 7 %
HCT: 37.3 % (ref 36.0–46.0)
Hemoglobin: 11.6 g/dL — ABNORMAL LOW (ref 12.0–15.0)
Immature Granulocytes: 0 %
Lymphocytes Relative: 27 %
Lymphs Abs: 1.8 10*3/uL (ref 0.7–4.0)
MCH: 27.1 pg (ref 26.0–34.0)
MCHC: 31.1 g/dL (ref 30.0–36.0)
MCV: 87.1 fL (ref 80.0–100.0)
Monocytes Absolute: 0.6 10*3/uL (ref 0.1–1.0)
Monocytes Relative: 9 %
Neutro Abs: 3.9 10*3/uL (ref 1.7–7.7)
Neutrophils Relative %: 57 %
Platelets: 257 10*3/uL (ref 150–400)
RBC: 4.28 MIL/uL (ref 3.87–5.11)
RDW: 15.9 % — ABNORMAL HIGH (ref 11.5–15.5)
WBC: 6.9 10*3/uL (ref 4.0–10.5)
nRBC: 0 % (ref 0.0–0.2)

## 2018-10-10 LAB — GLUCOSE, CAPILLARY
Glucose-Capillary: 162 mg/dL — ABNORMAL HIGH (ref 70–99)
Glucose-Capillary: 86 mg/dL (ref 70–99)
Glucose-Capillary: 96 mg/dL (ref 70–99)
Glucose-Capillary: 98 mg/dL (ref 70–99)

## 2018-10-10 NOTE — Progress Notes (Signed)
Subjective/Chief Complaint: Doing well today Having 4-5 bowel movements overnight  complains of no abdominal pain   Objective: Vital signs in last 24 hours: Temp:  [98.7 F (37.1 C)-99.2 F (37.3 C)] 98.7 F (37.1 C) (10/07 0556) Pulse Rate:  [69-71] 71 (10/07 0556) Resp:  [18] 18 (10/07 0556) BP: (144-160)/(61-83) 144/69 (10/07 0556) SpO2:  [89 %-96 %] 96 % (10/07 0556) Last BM Date: 10/09/18  Intake/Output from previous day: No intake/output data recorded. Intake/Output this shift: No intake/output data recorded.  Constitutional: No acute distress, conversant, appears states age. Eyes: Anicteric sclerae, moist conjunctiva, no lid lag Lungs: Clear to auscultation bilaterally, normal respiratory effort CV: regular rate and rhythm, no murmurs, no peripheral edema, pedal pulses 2+ GI: Soft, no masses or hepatosplenomegaly, non-tender to palpation Skin: No rashes, palpation reveals normal turgor Psychiatric: appropriate judgment and insight, oriented to person, place, and time  Lab Results:  Recent Labs    10/09/18 0338 10/10/18 0322  WBC 8.0 6.9  HGB 12.4 11.6*  HCT 39.0 37.3  PLT 279 257   BMET Recent Labs    10/09/18 0338 10/10/18 0322  NA 142 142  K 4.0 3.7  CL 103 103  CO2 28 27  GLUCOSE 122* 91  BUN 22 28*  CREATININE 1.38* 1.43*  CALCIUM 9.2 9.0   PT/INR No results for input(s): LABPROT, INR in the last 72 hours. ABG No results for input(s): PHART, HCO3 in the last 72 hours.  Invalid input(s): PCO2, PO2  Studies/Results: Ct Abdomen Pelvis W Contrast  Result Date: 10/08/2018 CLINICAL DATA:  Acute generalized abdominal pain. EXAM: CT ABDOMEN AND PELVIS WITH CONTRAST TECHNIQUE: Multidetector CT imaging of the abdomen and pelvis was performed using the standard protocol following bolus administration of intravenous contrast. CONTRAST:  173mL OMNIPAQUE IOHEXOL 300 MG/ML  SOLN COMPARISON:  August 09, 2018 FINDINGS: Lower chest: The lung bases are  clear. The heart is enlarged. Hepatobiliary: There is decreased hepatic attenuation suggestive of hepatic steatosis. Normal gallbladder.There is no biliary ductal dilation. Pancreas: Normal contours without ductal dilatation. No peripancreatic fluid collection. Spleen: No splenic laceration or hematoma. Adrenals/Urinary Tract: --Adrenal glands: No adrenal hemorrhage. --Right kidney/ureter: Again noted is asymmetrically decreased attenuation of much of the right kidney. The superior most aspect of the kidney is relatively spared. There is developing cortical thinning. There is some significant atherosclerotic disease at the origin of the right renal artery. There is no hydronephrosis. --Left kidney/ureter: No hydronephrosis or perinephric hematoma. --Urinary bladder: Unremarkable. Stomach/Bowel: --Stomach/Duodenum: No hiatal hernia or other gastric abnormality. Normal duodenal course and caliber. --Small bowel: There are multiple dilated loops of small bowel scattered throughout the abdomen. Many of these loops demonstrate air-fluid levels and measure up to approximately 3 cm in diameter. There is a transition point in the low midline abdomen (axial series 2, image 54 and coronal series 6, image 55). There is no pneumatosis. --Colon: There is scattered colonic diverticula without CT evidence for diverticulitis. --Appendix: Normal. Vascular/Lymphatic: Atherosclerotic calcification is present within the non-aneurysmal abdominal aorta, without hemodynamically significant stenosis. --No retroperitoneal lymphadenopathy. --No mesenteric lymphadenopathy. --No pelvic or inguinal lymphadenopathy. Reproductive: There is a right adnexal cystic structure measuring approximately 3.3 cm. Other: There is a small amount of free fluid in the patient's pelvis and abdomen, likely reactive to there is no free air. Musculoskeletal. Multilevel degenerative disc disease and facet arthrosis. No bony spinal canal stenosis. IMPRESSION: 1.  Moderate to low-grade small-bowel obstruction with a transition point in the low midline pelvis as  detailed above. 2. Again noted is asymmetrically decreased attenuation of the right kidney concerning for vascular compromise. There is some developing cortical thinning of the interpolar and lower pole areas of the right kidney. 3. Small amount of free fluid in the patient's abdomen and pelvis, likely reactive. 4. There is a 3.3 cm cystic structure involving the right ovary. Follow-up with a nonemergent outpatient ultrasound is recommended. 5. Hepatic steatosis. Aortic Atherosclerosis (ICD10-I70.0). Electronically Signed   By: Constance Holster M.D.   On: 10/08/2018 09:48   Dg Abd Portable 1v-small Bowel Obstruction Protocol-24 Hr Delay  Result Date: 10/09/2018 CLINICAL DATA:  Suspected small bowel obstruction. 24 hour delayed film. EXAM: PORTABLE ABDOMEN - 1 VIEW COMPARISON:  CT of 10/08/2018 and radiograph of 10/08/2018 FINDINGS: Persistent small bowel distension with passage of enteric contrast in the distal small bowel and into proximal:. It has no significant change in the caliber of small bowel loops in the left hemiabdomen which remain dilated. Lung bases are clear. Spinal degenerative changes are redemonstrated. No acute bone finding. IMPRESSION: Progression of contrast into proximal colon with persistent small bowel distention throughout the abdomen. Electronically Signed   By: Zetta Bills M.D.   On: 10/09/2018 12:53   Dg Abd Portable 1v-small Bowel Obstruction Protocol-initial, 8 Hr Delay  Result Date: 10/08/2018 CLINICAL DATA:  8 hour delayed small bowel film. EXAM: PORTABLE ABDOMEN - 1 VIEW COMPARISON:  CT scan, same date. FINDINGS: Patient was given oral contrast 8 hours prior to this abdominal film. There is moderate residual contrast in the stomach. There is progression however of contrast into the distal ileal loops of small bowel and possibly just into the cecum. The bowel loops are not  significantly dilated measuring less than 3 cm. No free air. IMPRESSION: Slow progression of contrast throughout the small bowel and probably just into the cecum. Electronically Signed   By: Marijo Sanes M.D.   On: 10/08/2018 21:17    Anti-infectives: Anti-infectives (From admission, onward)   None      Assessment/Plan: HTN HLD DM Decreased attenuation of right kidney on CT - Above per medicine -  SBO - CTModerate to low-grade small-bowel obstruction with a transition point in the low midline pelvisand fecalization of small bowel FEN -start liquids and adv as tol VTE -SCDs, okay for chemical prophylaxis from a general surgery standpoint ID -None   LOS: 2 days    Ralene Ok 10/10/2018

## 2018-10-10 NOTE — Progress Notes (Signed)
Hospitalist progress note  Mandy [name]  [id number] DOB: 10/26/1940 DOA: 10/08/2018 PCP: [name], MD  Narrative:  78 year old African-American female HLD, DM TY 2+ neuropathy + nephropathy, osteoporosis Recent admit 8/6-8/7 SBO,?  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-creatinine slightly up but baseline seems to be 1.2-1.3-->1.4-monitor  DVT prophylaxis: Lovenox code Status:   Full   family Communication:   None present disposition Plan: Inpatient pending resolution  Consultants:   General surgery none  Procedures:   None  Antimicrobials:   None currently  Subjective: Some stool no abd pain -n -v- cp -F -cough -cold  Objective: Vitals:   10/09/18 1317 10/09/18 1456 10/09/18 2054 10/10/18 0556  BP: (!) 160/83  (!) 152/61 (!) 144/69  Pulse: 69  70 71  Resp:   18 18  Temp: 99.2 F (37.3 C)  99.1 F (37.3 C) 98.7 F (37.1 C)  TempSrc: Oral  Oral Oral  SpO2: (!) 89% 93% 90% 96%  Weight:      Height:        Intake/Output Summary (Last 24 hours) at 10/10/2018 1324 Last data filed at 10/10/2018 0943 Gross per 24 hour  Intake 300 ml  Output 0 ml  Net 300 ml   Filed Weights   10/08/18 0658  Weight: 72.6 kg    Examination: EOMI NCAT no focal deficit thick neck Mallampati 2-3 Chest clinically clear no added sound no rales no rhonchi Abdomen obese nontender slightly distended No lower extremity edema Neurologically intact no focal deficit Skin soft supple no lower extremity  edema   Data Reviewed: I have personally reviewed following labs and imaging studies  CBC: Recent Labs  Lab 10/08/18 0720 10/09/18 0338 10/10/18 0322  WBC 11.2* 8.0 6.9  NEUTROABS 9.4*  --  3.9  HGB 14.1 12.4 11.6*  HCT 43.6 39.0 37.3  MCV 84.2 85.5 87.1  PLT 325 279 [id number]   Basic Metabolic Panel: Recent Labs  Lab 10/08/18 0720 10/09/18 0338 10/10/18 0322  NA 136 142 142  K 3.8 4.0 3.7  CL 98 103 103  CO2 27 28 27   GLUCOSE 203* 122* 91  BUN 19 22 28*  CREATININE 1.23* 1.38* 1.43*  CALCIUM 9.7 9.2 9.0  MG  --  2.6*  --    GFR: Estimated Creatinine Clearance: 29.3 mL/min (A) (by C-G formula based on SCr of 1.43 mg/dL (H)). Liver Function Tests: Recent Labs  Lab 10/08/18 0720  AST 27  ALT 16  ALKPHOS 46  BILITOT 0.4  PROT 8.6*  ALBUMIN 4.5   Recent Labs  Lab 10/08/18 0720  LIPASE 21   No results for input(s): AMMONIA in the last 168 hours. Coagulation Profile: No results for input(s): INR, PROTIME in the last 168 hours. Cardiac Enzymes:  Radiology Studies: Reviewed images personally in health database   Scheduled Meds: . insulin aspart  0-9 Units Subcutaneous Q6H  . sodium chloride flush  3 mL Intravenous Q12H  . sodium chloride flush  3 mL Intravenous Q12H   Continuous Infusions: . sodium chloride       LOS: 2 days   Time  spent: Herman, MD [facility]  If 7PM-7AM, please contact night-coverage-look on AMION to find my number otherwise-prefer pages-not epic chat,please 10/10/2018, 1:24 PM

## 2018-10-11 LAB — BASIC METABOLIC PANEL
Anion gap: 11 (ref 5–15)
BUN: 16 mg/dL (ref 8–23)
CO2: 28 mmol/L (ref 22–32)
Calcium: 8.7 mg/dL — ABNORMAL LOW (ref 8.9–10.3)
Chloride: 100 mmol/L (ref 98–111)
Creatinine, Ser: 1.11 mg/dL — ABNORMAL HIGH (ref 0.44–1.00)
GFR calc Af Amer: 55 mL/min — ABNORMAL LOW (ref >60)
GFR calc non Af Amer: 48 mL/min — ABNORMAL LOW (ref >60)
Glucose, Bld: 107 mg/dL — ABNORMAL HIGH (ref 70–99)
Potassium: 3.2 mmol/L — ABNORMAL LOW (ref 3.5–5.1)
Sodium: 139 mmol/L (ref 135–145)

## 2018-10-11 LAB — GLUCOSE, CAPILLARY
Glucose-Capillary: 97 mg/dL (ref 70–99)
Glucose-Capillary: 142 mg/dL — ABNORMAL HIGH (ref 70–99)

## 2018-10-11 NOTE — Discharge Summary (Signed)
Physician Discharge Summary  Mandy [name] [id number] DOB: 15-Apr-1940 DOA: 10/08/2018  PCP: [name], MD  Admit date: 10/08/2018 Discharge date: 10/11/2018  Time spent: 25 minutes  Recommendations for Outpatient Follow-up:  1. Recommend Chem-12, CBC 1 week 2. Recommend outpatient management for other issues including ovarian mass 3. Recommend A1c in about 3 months  Discharge Diagnoses:  Principal Problem:   SBO (small bowel obstruction) (HCC) Active Problems:   Diabetes mellitus type 2 in obese (HCC)   Dyslipidemia   Essential hypertension   Mass of right ovary   Hepatic steatosis   Aortic atherosclerosis ([place])   Discharge Condition: Improved  Diet recommendation: Heart healthy  Filed Weights   10/08/18 0658  Weight: 72.6 kg    History of present illness:  78 year old African-American female HLD, DM TY 2+ neuropathy + nephropathy, osteoporosis Recent admit 8/6-8/7 SBO,?  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, bilateral RAA duplex = no stenosis Returns to [facility] ED 10/6 low-grade SBO-General surgery recommended SBO protocol + Gastrografin    Hospital Course:  SBO-s/p gastrograffin-multiple stools-liquid diet-diet increased on discharge 10/8 as patient was tolerating full meals without issues encouraged to follow-up if further concerns in the outpatient setting  HTN-PTA amlodipine 10 atenolol 50 twice daily-start metoprolol IV for blood pressure above 160 SYS -resume metoprolol on d/c home  DM TY 2+ neuropathy/nephropathy-Sugar 90-160 Moderate obesity-BMI 31-outpatient follow-up-resumed metformin on discharge-needs follow-up  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-creatinine slightly up but baseline seems to be 1.2-1.3-->1.4 improved on discharge  Mild hypokalemia-monitor trends outpatient follow-up   Procedures: CT scan Consultations:  General surgery  Discharge Exam: Vitals:   10/10/18 2010 10/11/18 0556  BP: (!) 159/74 (!) 173/72  Pulse: 85 76  Resp:  18 18  Temp: 99.1 F (37.3 C) 98.5 F (36.9 C)  SpO2: 92% 92%    General: EOMI NCAT no focal deficit Cardiovascular: S1-S2 no murmur Respiratory: Clinically clear no rales no rhonchi Abdomen soft no rebound no guarding No lower extremity edema  Discharge Instructions   Discharge Instructions    Diet - low sodium heart healthy   Complete by: As directed    Discharge instructions   Complete by: As directed    Take all your medications without fail and resume them  you need labs in about 1 week   Increase activity slowly   Complete by: As directed      Allergies as of 10/11/2018   No Known Allergies     Medication List    TAKE these medications   alendronate 70 MG tablet Commonly known as: FOSAMAX Take 70 mg by mouth once a week. Take with a full glass of water on an empty stomach.   amLODipine 10 MG tablet Commonly known as: NORVASC Take 10 mg by mouth daily.   aspirin 81 MG EC tablet Take 1 tablet (81 mg total) by mouth daily.   atenolol 50 MG tablet Commonly known as: TENORMIN Take 50 mg by mouth 2 (two) times daily.   calcium carbonate 1500 (600 Ca) MG Tabs tablet Commonly known as: OSCAL Take 600 mg of elemental calcium by mouth 2 (two) times daily with a meal.   diclofenac sodium 1 % Gel Commonly known as: VOLTAREN Apply 4 g topically 4 (four) times daily as needed for pain.   gabapentin 100 MG capsule Commonly known as: NEURONTIN Take 100 mg by mouth at bedtime as needed (sleep).   meloxicam 7.5 MG tablet Commonly known as: MOBIC Take 7.5 mg by mouth 2 (two)  times daily as needed for pain.   metFORMIN 500 MG tablet Commonly known as: GLUCOPHAGE Take 500 mg by mouth 2 (two) times daily with a meal.   potassium chloride 10 MEQ tablet Commonly known as: KLOR-CON Take 10 mEq by mouth daily.   simvastatin 10 MG tablet Commonly known as: ZOCOR Take 10 mg by mouth at bedtime.   tiZANidine 4 MG tablet Commonly known as: ZANAFLEX Take 4 mg by  mouth 2 (two) times daily.   vitamin C 500 MG tablet Commonly known as: ASCORBIC ACID Take 500 mg by mouth daily.      No Known Allergies    The results of significant diagnostics from this hospitalization (including imaging, microbiology, ancillary and laboratory) are listed below for reference.    Significant Diagnostic Studies: Ct Abdomen Pelvis W Contrast  Result Date: 10/08/2018 CLINICAL DATA:  Acute generalized abdominal pain. EXAM: CT ABDOMEN AND PELVIS WITH CONTRAST TECHNIQUE: Multidetector CT imaging of the abdomen and pelvis was performed using the standard protocol following bolus administration of intravenous contrast. CONTRAST:  152mL OMNIPAQUE IOHEXOL 300 MG/ML  SOLN COMPARISON:  August 09, 2018 FINDINGS: Lower chest: The lung bases are clear. The heart is enlarged. Hepatobiliary: There is decreased hepatic attenuation suggestive of hepatic steatosis. Normal gallbladder.There is no biliary ductal dilation. Pancreas: Normal contours without ductal dilatation. No peripancreatic fluid collection. Spleen: No splenic laceration or hematoma. Adrenals/Urinary Tract: --Adrenal glands: No adrenal hemorrhage. --Right kidney/ureter: Again noted is asymmetrically decreased attenuation of much of the right kidney. The superior most aspect of the kidney is relatively spared. There is developing cortical thinning. There is some significant atherosclerotic disease at the origin of the right renal artery. There is no hydronephrosis. --Left kidney/ureter: No hydronephrosis or perinephric hematoma. --Urinary bladder: Unremarkable. Stomach/Bowel: --Stomach/Duodenum: No hiatal hernia or other gastric abnormality. Normal duodenal course and caliber. --Small bowel: There are multiple dilated loops of small bowel scattered throughout the abdomen. Many of these loops demonstrate air-fluid levels and measure up to approximately 3 cm in diameter. There is a transition point in the low midline abdomen (axial  series 2, image 54 and coronal series 6, image 55). There is no pneumatosis. --Colon: There is scattered colonic diverticula without CT evidence for diverticulitis. --Appendix: Normal. Vascular/Lymphatic: Atherosclerotic calcification is present within the non-aneurysmal abdominal aorta, without hemodynamically significant stenosis. --No retroperitoneal lymphadenopathy. --No mesenteric lymphadenopathy. --No pelvic or inguinal lymphadenopathy. Reproductive: There is a right adnexal cystic structure measuring approximately 3.3 cm. Other: There is a small amount of free fluid in the patient's pelvis and abdomen, likely reactive to there is no free air. Musculoskeletal. Multilevel degenerative disc disease and facet arthrosis. No bony spinal canal stenosis. IMPRESSION: 1. Moderate to low-grade small-bowel obstruction with a transition point in the low midline pelvis as detailed above. 2. Again noted is asymmetrically decreased attenuation of the right kidney concerning for vascular compromise. There is some developing cortical thinning of the interpolar and lower pole areas of the right kidney. 3. Small amount of free fluid in the patient's abdomen and pelvis, likely reactive. 4. There is a 3.3 cm cystic structure involving the right ovary. Follow-up with a nonemergent outpatient ultrasound is recommended. 5. Hepatic steatosis. Aortic Atherosclerosis (ICD10-I70.0). Electronically Signed   By: Constance Holster M.D.   On: 10/08/2018 09:48   Dg Abd Portable 1v-small Bowel Obstruction Protocol-24 Hr Delay  Result Date: 10/09/2018 CLINICAL DATA:  Suspected small bowel obstruction. 24 hour delayed film. EXAM: PORTABLE ABDOMEN - 1 VIEW COMPARISON:  CT of  10/08/2018 and radiograph of 10/08/2018 FINDINGS: Persistent small bowel distension with passage of enteric contrast in the distal small bowel and into proximal:. It has no significant change in the caliber of small bowel loops in the left hemiabdomen which remain  dilated. Lung bases are clear. Spinal degenerative changes are redemonstrated. No acute bone finding. IMPRESSION: Progression of contrast into proximal colon with persistent small bowel distention throughout the abdomen. Electronically Signed   By: Zetta Bills M.D.   On: 10/09/2018 12:53   Dg Abd Portable 1v-small Bowel Obstruction Protocol-initial, 8 Hr Delay  Result Date: 10/08/2018 CLINICAL DATA:  8 hour delayed small bowel film. EXAM: PORTABLE ABDOMEN - 1 VIEW COMPARISON:  CT scan, same date. FINDINGS: Patient was given oral contrast 8 hours prior to this abdominal film. There is moderate residual contrast in the stomach. There is progression however of contrast into the distal ileal loops of small bowel and possibly just into the cecum. The bowel loops are not significantly dilated measuring less than 3 cm. No free air. IMPRESSION: Slow progression of contrast throughout the small bowel and probably just into the cecum. Electronically Signed   By: Marijo Sanes M.D.   On: 10/08/2018 21:17   Mm 3d Screen Breast Bilateral  Result Date: 10/02/2018 CLINICAL DATA:  Screening. EXAM: DIGITAL SCREENING BILATERAL MAMMOGRAM WITH TOMO AND CAD COMPARISON:  Previous exam(s). ACR Breast Density Category b: There are scattered areas of fibroglandular density. FINDINGS: There are no findings suspicious for malignancy. Images were processed with CAD. IMPRESSION: No mammographic evidence of malignancy. A result letter of this screening mammogram will be mailed directly to the patient. RECOMMENDATION: Screening mammogram in one year. (Code:SM-B-01Y) BI-RADS CATEGORY  1: Negative. Electronically Signed   By: Abelardo Diesel M.D.   On: 10/02/2018 13:37    Microbiology: Recent Results (from the past 240 hour(s))  SARS Coronavirus 2 [facility] order, Performed in [facility] hospital lab) Nasopharyngeal Nasopharyngeal Swab     Status: None   Collection Time: 10/08/18 10:14 AM   Specimen: Nasopharyngeal Swab  Result  Value Ref Range Status   SARS Coronavirus 2 NEGATIVE NEGATIVE Final    Comment: (NOTE) If result is NEGATIVE SARS-CoV-2 target nucleic acids are NOT DETECTED. The SARS-CoV-2 RNA is generally detectable in upper and lower  respiratory specimens during the acute phase of infection. The lowest  concentration of SARS-CoV-2 viral copies this assay can detect is 250  copies / mL. A negative result does not preclude SARS-CoV-2 infection  and should not be used as the sole basis for treatment or other  patient management decisions.  A negative result may occur with  improper specimen collection / handling, submission of specimen other  than nasopharyngeal swab, presence of viral mutation(s) within the  areas targeted by this assay, and inadequate number of viral copies  (<250 copies / mL). A negative result must be combined with clinical  observations, patient history, and epidemiological information. If result is POSITIVE SARS-CoV-2 target nucleic acids are DETECTED. The SARS-CoV-2 RNA is generally detectable in upper and lower  respiratory specimens dur ing the acute phase of infection.  Positive  results are indicative of active infection with SARS-CoV-2.  Clinical  correlation with patient history and other diagnostic information is  necessary to determine patient infection status.  Positive results do  not rule out bacterial infection or co-infection with other viruses. If result is PRESUMPTIVE POSTIVE SARS-CoV-2 nucleic acids MAY BE PRESENT.   A presumptive positive result was obtained on the submitted  specimen  and confirmed on repeat testing.  While 2019 novel coronavirus  (SARS-CoV-2) nucleic acids may be present in the submitted sample  additional confirmatory testing may be necessary for epidemiological  and / or clinical management purposes  to differentiate between  SARS-CoV-2 and other Sarbecovirus currently known to infect humans.  If clinically indicated additional testing  with an alternate test  methodology [phone]) is advised. The SARS-CoV-2 RNA is generally  detectable in upper and lower respiratory sp ecimens during the acute  phase of infection. The expected result is Negative. Fact Sheet for Patients:  StrictlyIdeas.no Fact Sheet for Healthcare Providers: BankingDealers.co.za This test is not yet approved or cleared by the [place] FDA and has been authorized for detection and/or diagnosis of SARS-CoV-2 by FDA under an Emergency Use Authorization (EUA).  This EUA will remain in effect (meaning this test can be used) for the duration of the COVID-19 declaration under Section 564(b)(1) of the Act, 21 U.S.C. section 360bbb-3(b)(1), unless the authorization is terminated or revoked sooner. Performed at [facility], [place] [address], [place], [place] [address]      Labs: Basic Metabolic Panel: Recent Labs  Lab 10/08/18 0720 10/09/18 0338 10/10/18 0322 10/11/18 0920  NA 136 142 142 139  K 3.8 4.0 3.7 3.2*  CL 98 103 103 100  CO2 27 28 27 28   GLUCOSE 203* 122* 91 107*  BUN 19 22 28* 16  CREATININE 1.23* 1.38* 1.43* 1.11*  CALCIUM 9.7 9.2 9.0 8.7*  MG  --  2.6*  --   --    Liver Function Tests: Recent Labs  Lab 10/08/18 0720  AST 27  ALT 16  ALKPHOS 46  BILITOT 0.4  PROT 8.6*  ALBUMIN 4.5   Recent Labs  Lab 10/08/18 0720  LIPASE 21   No results for input(s): AMMONIA in the last 168 hours. CBC: Recent Labs  Lab 10/08/18 0720 10/09/18 0338 10/10/18 0322  WBC 11.2* 8.0 6.9  NEUTROABS 9.4*  --  3.9  HGB 14.1 12.4 11.6*  HCT 43.6 39.0 37.3  MCV 84.2 85.5 87.1  PLT 325 279 257   Cardiac Enzymes: No results for input(s): CKTOTAL, CKMB, CKMBINDEX, TROPONINI in the last 168 hours. BNP: BNP (last 3 results) No results for input(s): BNP in the last 8760 hours.  ProBNP (last 3 results) No results for input(s): PROBNP in the last 8760  hours.  CBG: Recent Labs  Lab 10/10/18 0558 10/10/18 1135 10/10/18 1656 10/10/18 2357 10/11/18 0553  GLUCAP 96 162* 86 98 97       Signed:  Nita Sells MD   [facility] 10/11/2018, 10:35 AM

## 2018-10-11 NOTE — Final Consult Note (Signed)
Consultant Final Sign-Off Note    Assessment/Final recommendations  Mandy [name] is a 78 y.o. female followed by me for SBO  Patient is tolerating a regular diet without any nausea or vomiting.  She is having bowel function including passing flatus and having normal BMs.   Wound care (if applicable): N/A   Diet at discharge: soft diet for the next 2 weeks and then start a high fiber diet thereafter   Activity at discharge: per primary team   Follow-up appointment:  PRN   Pending results:  Unresulted Labs (From admission, onward)   None       Medication recommendations: Recommend starting patient on Miralax daily at discharge given fecalizatoin of small bowel on CT and patient's report that she was constipated for some time leading up to admission   Other recommendations:    Thank you for allowing [place] to participate in the care of your patient!  Please consult [place] again if you have further needs for your patient.  Barth Kirks [facility] 10/11/2018 10:50 AM    Subjective   Patient doing well.  She reports that she is tolerating her diet without any nausea or vomiting.  She is passing flatus.  She had 4 BMs yesterday.  Objective  Vital signs in last 24 hours: Temp:  [98.5 F (36.9 C)-99.1 F (37.3 C)] 98.5 F (36.9 C) (10/08 0556) Pulse Rate:  [76-85] 76 (10/08 0556) Resp:  [18] 18 (10/08 0556) BP: (159-173)/(72-89) 173/72 (10/08 0556) SpO2:  [92 %-94 %] 92 % (10/08 0556)  Gen: Awake and alert, NAD Lungs: Normal rate and effort Abd: Soft, ND, NT, +BS   Pertinent labs and Studies: Recent Labs    10/09/18 0338 10/10/18 0322  WBC 8.0 6.9  HGB 12.4 11.6*  HCT 39.0 37.3   BMET Recent Labs    10/10/18 0322 10/11/18 0920  NA 142 139  K 3.7 3.2*  CL 103 100  CO2 27 28  GLUCOSE 91 107*  BUN 28* 16  CREATININE 1.43* 1.11*  CALCIUM 9.0 8.7*   No results for input(s): LABURIN in the last 72 hours. Results for orders placed or performed during the hospital  encounter of 10/08/18  SARS Coronavirus 2 [facility] order, Performed in [facility] hospital lab) Nasopharyngeal Nasopharyngeal Swab     Status: None   Collection Time: 10/08/18 10:14 AM   Specimen: Nasopharyngeal Swab  Result Value Ref Range Status   SARS Coronavirus 2 NEGATIVE NEGATIVE Final    Comment: (NOTE) If result is NEGATIVE SARS-CoV-2 target nucleic acids are NOT DETECTED. The SARS-CoV-2 RNA is generally detectable in upper and lower  respiratory specimens during the acute phase of infection. The lowest  concentration of SARS-CoV-2 viral copies this assay can detect is 250  copies / mL. A negative result does not preclude SARS-CoV-2 infection  and should not be used as the sole basis for treatment or other  patient management decisions.  A negative result may occur with  improper specimen collection / handling, submission of specimen other  than nasopharyngeal swab, presence of viral mutation(s) within the  areas targeted by this assay, and inadequate number of viral copies  (<250 copies / mL). A negative result must be combined with clinical  observations, patient history, and epidemiological information. If result is POSITIVE SARS-CoV-2 target nucleic acids are DETECTED. The SARS-CoV-2 RNA is generally detectable in upper and lower  respiratory specimens dur ing the acute phase of infection.  Positive  results are indicative of active infection with  SARS-CoV-2.  Clinical  correlation with patient history and other diagnostic information is  necessary to determine patient infection status.  Positive results do  not rule out bacterial infection or co-infection with other viruses. If result is PRESUMPTIVE POSTIVE SARS-CoV-2 nucleic acids MAY BE PRESENT.   A presumptive positive result was obtained on the submitted specimen  and confirmed on repeat testing.  While 2019 novel coronavirus  (SARS-CoV-2) nucleic acids may be present in the submitted sample  additional  confirmatory testing may be necessary for epidemiological  and / or clinical management purposes  to differentiate between  SARS-CoV-2 and other Sarbecovirus currently known to infect humans.  If clinically indicated additional testing with an alternate test  methodology [phone]) is advised. The SARS-CoV-2 RNA is generally  detectable in upper and lower respiratory sp ecimens during the acute  phase of infection. The expected result is Negative. Fact Sheet for Patients:  StrictlyIdeas.no Fact Sheet for Healthcare Providers: BankingDealers.co.za This test is not yet approved or cleared by the [place] FDA and has been authorized for detection and/or diagnosis of SARS-CoV-2 by FDA under an Emergency Use Authorization (EUA).  This EUA will remain in effect (meaning this test can be used) for the duration of the COVID-19 declaration under Section 564(b)(1) of the Act, 21 U.S.C. section 360bbb-3(b)(1), unless the authorization is terminated or revoked sooner. Performed at [facility], [place] [address] [place], [place] [address]     Imaging: No results found.

## 2018-10-11 NOTE — Care Management Important Message (Signed)
Important Message  Patient Details IM Letter given to Marlou Starks SW to present to the Patient Name: Mandy [name] MRN: [id number] Date of Birth: Feb 22, 1940   Medicare Important Message Given:  Yes     Kerin Salen 10/11/2018, 9:36 AM

## 2018-10-11 NOTE — Plan of Care (Signed)
Assessment unchanged. Dc instructions daughter and pt with verbalized understanding through teach back. Discharged via wc to front entrance accompanied by RN and daughter.

## 2019-01-31 ENCOUNTER — Emergency Department (HOSPITAL_COMMUNITY): Payer: Medicare HMO

## 2019-01-31 ENCOUNTER — Other Ambulatory Visit: Payer: Self-pay

## 2019-01-31 ENCOUNTER — Encounter (HOSPITAL_COMMUNITY): Payer: Self-pay | Admitting: *Deleted

## 2019-01-31 ENCOUNTER — Inpatient Hospital Stay (HOSPITAL_COMMUNITY)
Admission: EM | Admit: 2019-01-31 | Discharge: 2019-02-04 | DRG: 177 | Disposition: A | Discharge status: Home Health | Payer: Medicare HMO | Attending: Internal Medicine | Admitting: Internal Medicine

## 2019-01-31 DIAGNOSIS — E1122 Type 2 diabetes mellitus with diabetic chronic kidney disease: Secondary | ICD-10-CM | POA: Diagnosis present

## 2019-01-31 DIAGNOSIS — K56609 Unspecified intestinal obstruction, unspecified as to partial versus complete obstruction: Secondary | ICD-10-CM | POA: Diagnosis present

## 2019-01-31 DIAGNOSIS — I361 Nonrheumatic tricuspid (valve) insufficiency: Secondary | ICD-10-CM | POA: Diagnosis not present

## 2019-01-31 DIAGNOSIS — Z79899 Other long term (current) drug therapy: Secondary | ICD-10-CM | POA: Diagnosis not present

## 2019-01-31 DIAGNOSIS — E0590 Thyrotoxicosis, unspecified without thyrotoxic crisis or storm: Secondary | ICD-10-CM

## 2019-01-31 DIAGNOSIS — E785 Hyperlipidemia, unspecified: Secondary | ICD-10-CM | POA: Diagnosis present

## 2019-01-31 DIAGNOSIS — I10 Essential (primary) hypertension: Secondary | ICD-10-CM | POA: Diagnosis not present

## 2019-01-31 DIAGNOSIS — I700 Atherosclerosis of aorta: Secondary | ICD-10-CM | POA: Diagnosis present

## 2019-01-31 DIAGNOSIS — Z7983 Long term (current) use of bisphosphonates: Secondary | ICD-10-CM | POA: Diagnosis not present

## 2019-01-31 DIAGNOSIS — G9341 Metabolic encephalopathy: Secondary | ICD-10-CM | POA: Diagnosis not present

## 2019-01-31 DIAGNOSIS — N839 Noninflammatory disorder of ovary, fallopian tube and broad ligament, unspecified: Secondary | ICD-10-CM | POA: Diagnosis present

## 2019-01-31 DIAGNOSIS — R9431 Abnormal electrocardiogram [ECG] [EKG]: Secondary | ICD-10-CM | POA: Diagnosis present

## 2019-01-31 DIAGNOSIS — E871 Hypo-osmolality and hyponatremia: Secondary | ICD-10-CM | POA: Diagnosis not present

## 2019-01-31 DIAGNOSIS — Z9981 Dependence on supplemental oxygen: Secondary | ICD-10-CM

## 2019-01-31 DIAGNOSIS — R946 Abnormal results of thyroid function studies: Secondary | ICD-10-CM | POA: Diagnosis present

## 2019-01-31 DIAGNOSIS — E861 Hypovolemia: Secondary | ICD-10-CM | POA: Diagnosis present

## 2019-01-31 DIAGNOSIS — E669 Obesity, unspecified: Secondary | ICD-10-CM | POA: Diagnosis present

## 2019-01-31 DIAGNOSIS — Z6833 Body mass index (BMI) 33.0-33.9, adult: Secondary | ICD-10-CM | POA: Diagnosis not present

## 2019-01-31 DIAGNOSIS — R0902 Hypoxemia: Secondary | ICD-10-CM | POA: Diagnosis present

## 2019-01-31 DIAGNOSIS — U071 COVID-19: Secondary | ICD-10-CM | POA: Diagnosis not present

## 2019-01-31 DIAGNOSIS — Z7984 Long term (current) use of oral hypoglycemic drugs: Secondary | ICD-10-CM

## 2019-01-31 DIAGNOSIS — D631 Anemia in chronic kidney disease: Secondary | ICD-10-CM | POA: Diagnosis present

## 2019-01-31 DIAGNOSIS — K760 Fatty (change of) liver, not elsewhere classified: Secondary | ICD-10-CM | POA: Diagnosis present

## 2019-01-31 DIAGNOSIS — N183 Chronic kidney disease, stage 3 unspecified: Secondary | ICD-10-CM | POA: Diagnosis present

## 2019-01-31 DIAGNOSIS — I1310 Hypertensive heart and chronic kidney disease without heart failure, with stage 1 through stage 4 chronic kidney disease, or unspecified chronic kidney disease: Secondary | ICD-10-CM | POA: Diagnosis present

## 2019-01-31 DIAGNOSIS — E1140 Type 2 diabetes mellitus with diabetic neuropathy, unspecified: Secondary | ICD-10-CM | POA: Diagnosis present

## 2019-01-31 DIAGNOSIS — R110 Nausea: Secondary | ICD-10-CM | POA: Diagnosis present

## 2019-01-31 DIAGNOSIS — E860 Dehydration: Secondary | ICD-10-CM | POA: Diagnosis present

## 2019-01-31 DIAGNOSIS — E119 Type 2 diabetes mellitus without complications: Secondary | ICD-10-CM | POA: Diagnosis present

## 2019-01-31 DIAGNOSIS — M810 Age-related osteoporosis without current pathological fracture: Secondary | ICD-10-CM | POA: Diagnosis present

## 2019-01-31 DIAGNOSIS — E1169 Type 2 diabetes mellitus with other specified complication: Secondary | ICD-10-CM | POA: Diagnosis not present

## 2019-01-31 DIAGNOSIS — I340 Nonrheumatic mitral (valve) insufficiency: Secondary | ICD-10-CM | POA: Diagnosis not present

## 2019-01-31 LAB — BASIC METABOLIC PANEL
Anion gap: 10 (ref 5–15)
Anion gap: 14 (ref 5–15)
Anion gap: 12 (ref 5–15)
Anion gap: 11 (ref 5–15)
BUN: 9 mg/dL (ref 8–23)
BUN: 8 mg/dL (ref 8–23)
BUN: 10 mg/dL (ref 8–23)
BUN: 10 mg/dL (ref 8–23)
CO2: 28 mmol/L (ref 22–32)
CO2: 24 mmol/L (ref 22–32)
CO2: 25 mmol/L (ref 22–32)
CO2: 25 mmol/L (ref 22–32)
Calcium: 8.9 mg/dL (ref 8.9–10.3)
Calcium: 8.9 mg/dL (ref 8.9–10.3)
Calcium: 8.9 mg/dL (ref 8.9–10.3)
Calcium: 9 mg/dL (ref 8.9–10.3)
Chloride: 80 mmol/L — ABNORMAL LOW (ref 98–111)
Chloride: 80 mmol/L — ABNORMAL LOW (ref 98–111)
Chloride: 84 mmol/L — ABNORMAL LOW (ref 98–111)
Chloride: 83 mmol/L — ABNORMAL LOW (ref 98–111)
Creatinine, Ser: 0.96 mg/dL (ref 0.44–1.00)
Creatinine, Ser: 0.96 mg/dL (ref 0.44–1.00)
Creatinine, Ser: 1.03 mg/dL — ABNORMAL HIGH (ref 0.44–1.00)
Creatinine, Ser: 0.98 mg/dL (ref 0.44–1.00)
GFR calc Af Amer: >60 mL/min (ref >60)
GFR calc Af Amer: >60 mL/min (ref >60)
GFR calc Af Amer: >60 mL/min (ref >60)
GFR calc Af Amer: >60 mL/min (ref >60)
GFR calc non Af Amer: 57 mL/min — ABNORMAL LOW (ref >60)
GFR calc non Af Amer: 57 mL/min — ABNORMAL LOW (ref >60)
GFR calc non Af Amer: 52 mL/min — ABNORMAL LOW (ref >60)
GFR calc non Af Amer: 56 mL/min — ABNORMAL LOW (ref >60)
Glucose, Bld: 106 mg/dL — ABNORMAL HIGH (ref 70–99)
Glucose, Bld: 113 mg/dL — ABNORMAL HIGH (ref 70–99)
Glucose, Bld: 162 mg/dL — ABNORMAL HIGH (ref 70–99)
Glucose, Bld: 144 mg/dL — ABNORMAL HIGH (ref 70–99)
Potassium: 3.7 mmol/L (ref 3.5–5.1)
Potassium: 3.6 mmol/L (ref 3.5–5.1)
Potassium: 4.3 mmol/L (ref 3.5–5.1)
Potassium: 4.1 mmol/L (ref 3.5–5.1)
Sodium: 118 mmol/L — CL (ref 135–145)
Sodium: 118 mmol/L — CL (ref 135–145)
Sodium: 121 mmol/L — ABNORMAL LOW (ref 135–145)
Sodium: 119 mmol/L — CL (ref 135–145)

## 2019-01-31 LAB — CBC WITH DIFFERENTIAL/PLATELET
Abs Immature Granulocytes: 0.09 10*3/uL — ABNORMAL HIGH (ref 0.00–0.07)
Basophils Absolute: 0 10*3/uL (ref 0.0–0.1)
Basophils Relative: 0 %
Eosinophils Absolute: 0 10*3/uL (ref 0.0–0.5)
Eosinophils Relative: 0 %
HCT: 35.2 % — ABNORMAL LOW (ref 36.0–46.0)
Hemoglobin: 12.4 g/dL (ref 12.0–15.0)
Immature Granulocytes: 2 %
Lymphocytes Relative: 21 %
Lymphs Abs: 1.1 10*3/uL (ref 0.7–4.0)
MCH: 26.7 pg (ref 26.0–34.0)
MCHC: 35.2 g/dL (ref 30.0–36.0)
MCV: 75.9 fL — ABNORMAL LOW (ref 80.0–100.0)
Monocytes Absolute: 0.6 10*3/uL (ref 0.1–1.0)
Monocytes Relative: 12 %
Neutro Abs: 3.5 10*3/uL (ref 1.7–7.7)
Neutrophils Relative %: 65 %
Platelets: 513 10*3/uL — ABNORMAL HIGH (ref 150–400)
RBC: 4.64 MIL/uL (ref 3.87–5.11)
RDW: 12.5 % (ref 11.5–15.5)
WBC: 5.4 10*3/uL (ref 4.0–10.5)
nRBC: 0 % (ref 0.0–0.2)

## 2019-01-31 LAB — PROCALCITONIN: Procalcitonin: <0.1 ng/mL

## 2019-01-31 LAB — POCT I-STAT EG7
Acid-Base Excess: 4 mmol/L — ABNORMAL HIGH (ref 0.0–2.0)
Bicarbonate: 28.5 mmol/L — ABNORMAL HIGH (ref 20.0–28.0)
Calcium, Ion: 1.11 mmol/L — ABNORMAL LOW (ref 1.15–1.40)
HCT: 41 % (ref 36.0–46.0)
Hemoglobin: 13.9 g/dL (ref 12.0–15.0)
O2 Saturation: 70 %
Patient temperature: 37
Potassium: 3.7 mmol/L (ref 3.5–5.1)
Sodium: 112 mmol/L — CL (ref 135–145)
TCO2: 30 mmol/L (ref 22–32)
pCO2, Ven: 39.7 mmHg — ABNORMAL LOW (ref 44.0–60.0)
pH, Ven: 7.463 — ABNORMAL HIGH (ref 7.250–7.430)
pO2, Ven: 35 mmHg (ref 32.0–45.0)

## 2019-01-31 LAB — COMPREHENSIVE METABOLIC PANEL
ALT: 18 U/L (ref 0–44)
AST: 30 U/L (ref 15–41)
Albumin: 3.3 g/dL — ABNORMAL LOW (ref 3.5–5.0)
Alkaline Phosphatase: 44 U/L (ref 38–126)
Anion gap: 12 (ref 5–15)
BUN: 10 mg/dL (ref 8–23)
CO2: 27 mmol/L (ref 22–32)
Calcium: 9.3 mg/dL (ref 8.9–10.3)
Chloride: 74 mmol/L — ABNORMAL LOW (ref 98–111)
Creatinine, Ser: 1.05 mg/dL — ABNORMAL HIGH (ref 0.44–1.00)
GFR calc Af Amer: 59 mL/min — ABNORMAL LOW (ref >60)
GFR calc non Af Amer: 51 mL/min — ABNORMAL LOW (ref >60)
Glucose, Bld: 114 mg/dL — ABNORMAL HIGH (ref 70–99)
Potassium: 3.7 mmol/L (ref 3.5–5.1)
Sodium: 113 mmol/L — CL (ref 135–145)
Total Bilirubin: 0.5 mg/dL (ref 0.3–1.2)
Total Protein: 6.8 g/dL (ref 6.5–8.1)

## 2019-01-31 LAB — FERRITIN: Ferritin: 275 ng/mL (ref 11–307)

## 2019-01-31 LAB — ABO/RH: ABO/RH(D): O POS

## 2019-01-31 LAB — LACTIC ACID, PLASMA: Lactic Acid, Venous: 1.1 mmol/L (ref 0.5–1.9)

## 2019-01-31 LAB — TRIGLYCERIDES: Triglycerides: 91 mg/dL (ref <150)

## 2019-01-31 LAB — FIBRINOGEN: Fibrinogen: 509 mg/dL — ABNORMAL HIGH (ref 210–475)

## 2019-01-31 LAB — D-DIMER, QUANTITATIVE: D-Dimer, Quant: 2.4 ug/mL-FEU — ABNORMAL HIGH (ref 0.00–0.50)

## 2019-01-31 LAB — BRAIN NATRIURETIC PEPTIDE: B Natriuretic Peptide: 163.5 pg/mL — ABNORMAL HIGH (ref 0.0–100.0)

## 2019-01-31 LAB — OSMOLALITY: Osmolality: 246 mOsm/kg — CL (ref 275–295)

## 2019-01-31 LAB — RESPIRATORY PANEL BY RT PCR (FLU A&B, COVID)
Influenza A by PCR: NEGATIVE
Influenza B by PCR: NEGATIVE
SARS Coronavirus 2 by RT PCR: POSITIVE — AB

## 2019-01-31 LAB — CBG MONITORING, ED: Glucose-Capillary: 108 mg/dL — ABNORMAL HIGH (ref 70–99)

## 2019-01-31 LAB — GLUCOSE, CAPILLARY
Glucose-Capillary: 129 mg/dL — ABNORMAL HIGH (ref 70–99)
Glucose-Capillary: 169 mg/dL — ABNORMAL HIGH (ref 70–99)
Glucose-Capillary: 141 mg/dL — ABNORMAL HIGH (ref 70–99)

## 2019-01-31 LAB — HEMOGLOBIN A1C
Hgb A1c MFr Bld: 6.7 % — ABNORMAL HIGH (ref 4.8–5.6)
Mean Plasma Glucose: 145.59 mg/dL

## 2019-01-31 LAB — SODIUM, URINE, RANDOM: Sodium, Ur: 39 mmol/L

## 2019-01-31 LAB — OSMOLALITY, URINE: Osmolality, Ur: 114 mOsm/kg — ABNORMAL LOW (ref 300–900)

## 2019-01-31 LAB — LACTATE DEHYDROGENASE: LDH: 162 U/L (ref 98–192)

## 2019-01-31 LAB — C-REACTIVE PROTEIN: CRP: 0.8 mg/dL (ref <1.0)

## 2019-01-31 MED ORDER — ATENOLOL 50 MG PO TABS
50.0000 mg | ORAL_TABLET | Freq: Two times a day (BID) | ORAL | Status: DC
  Administered 2019-01-31 – 2019-02-04 (×9): 50 mg via ORAL
  Filled 2019-01-31: qty 1
  Filled 2019-01-31: qty 2
  Filled 2019-01-31: qty 1
  Filled 2019-01-31 (×4): qty 2
  Filled 2019-01-31 (×5): qty 1
  Filled 2019-01-31 (×2): qty 2
  Filled 2019-01-31 (×2): qty 1
  Filled 2019-01-31: qty 2
  Filled 2019-01-31: qty 1

## 2019-01-31 MED ORDER — SODIUM CHLORIDE 0.9 % IV SOLN
100.0000 mg | Freq: Every day | INTRAVENOUS | Status: AC
  Administered 2019-02-01 – 2019-02-04 (×4): 100 mg via INTRAVENOUS
  Filled 2019-01-31 (×4): qty 20

## 2019-01-31 MED ORDER — ZINC SULFATE 220 (50 ZN) MG PO CAPS
220.0000 mg | ORAL_CAPSULE | Freq: Every day | ORAL | Status: DC
  Administered 2019-01-31 – 2019-02-04 (×5): 220 mg via ORAL
  Filled 2019-01-31 (×4): qty 1

## 2019-01-31 MED ORDER — SODIUM CHLORIDE 0.9% FLUSH
3.0000 mL | Freq: Two times a day (BID) | INTRAVENOUS | Status: DC
  Administered 2019-01-31 – 2019-02-04 (×7): 3 mL via INTRAVENOUS

## 2019-01-31 MED ORDER — DEXAMETHASONE 4 MG PO TABS
6.0000 mg | ORAL_TABLET | Freq: Every day | ORAL | Status: DC
  Administered 2019-02-01 – 2019-02-04 (×4): 6 mg via ORAL
  Filled 2019-01-31 (×3): qty 2

## 2019-01-31 MED ORDER — DICLOFENAC SODIUM 1 % TD GEL
4.0000 g | Freq: Four times a day (QID) | TRANSDERMAL | Status: DC | PRN
  Filled 2019-01-31: qty 100

## 2019-01-31 MED ORDER — ALBUTEROL SULFATE HFA 108 (90 BASE) MCG/ACT IN AERS
2.0000 | INHALATION_SPRAY | Freq: Four times a day (QID) | RESPIRATORY_TRACT | Status: DC
  Administered 2019-01-31 – 2019-02-01 (×4): 2 via RESPIRATORY_TRACT
  Filled 2019-01-31: qty 6.7

## 2019-01-31 MED ORDER — ACETAMINOPHEN 325 MG PO TABS
650.0000 mg | ORAL_TABLET | Freq: Four times a day (QID) | ORAL | Status: DC | PRN
  Administered 2019-02-01 – 2019-02-03 (×4): 650 mg via ORAL
  Filled 2019-01-31 (×4): qty 2

## 2019-01-31 MED ORDER — GUAIFENESIN-DM 100-10 MG/5ML PO SYRP
10.0000 mL | ORAL_SOLUTION | ORAL | Status: DC | PRN
  Administered 2019-02-01: 10 mL via ORAL
  Filled 2019-01-31: qty 10

## 2019-01-31 MED ORDER — INSULIN ASPART 100 UNIT/ML ~~LOC~~ SOLN
0.0000 [IU] | Freq: Three times a day (TID) | SUBCUTANEOUS | Status: DC
  Administered 2019-01-31: 2 [IU] via SUBCUTANEOUS
  Administered 2019-02-01: 3 [IU] via SUBCUTANEOUS
  Administered 2019-02-01: 1 [IU] via SUBCUTANEOUS
  Administered 2019-02-02 (×2): 2 [IU] via SUBCUTANEOUS
  Administered 2019-02-03: 3 [IU] via SUBCUTANEOUS
  Administered 2019-02-03: 1 [IU] via SUBCUTANEOUS
  Administered 2019-02-03 – 2019-02-04 (×2): 2 [IU] via SUBCUTANEOUS
  Administered 2019-02-04: 1 [IU] via SUBCUTANEOUS

## 2019-01-31 MED ORDER — ALBUTEROL SULFATE HFA 108 (90 BASE) MCG/ACT IN AERS: 2.0000 | INHALATION_SPRAY | Freq: Four times a day (QID) | RESPIRATORY_TRACT | Status: DC | PRN

## 2019-01-31 MED ORDER — ENOXAPARIN SODIUM 40 MG/0.4ML ~~LOC~~ SOLN
40.0000 mg | Freq: Every day | SUBCUTANEOUS | Status: DC
  Administered 2019-01-31 – 2019-02-04 (×5): 40 mg via SUBCUTANEOUS
  Filled 2019-01-31 (×5): qty 0.4

## 2019-01-31 MED ORDER — SODIUM CHLORIDE 0.9 % IV SOLN: INTRAVENOUS | Status: DC

## 2019-01-31 MED ORDER — ASCORBIC ACID 500 MG PO TABS
500.0000 mg | ORAL_TABLET | Freq: Every day | ORAL | Status: DC
  Administered 2019-01-31 – 2019-02-04 (×5): 500 mg via ORAL
  Filled 2019-01-31 (×4): qty 1

## 2019-01-31 MED ORDER — SODIUM CHLORIDE 0.9 % IV BOLUS (SEPSIS)
500.0000 mL | Freq: Once | INTRAVENOUS | Status: AC
  Administered 2019-01-31: 500 mL via INTRAVENOUS

## 2019-01-31 MED ORDER — AMLODIPINE BESYLATE 5 MG PO TABS
10.0000 mg | ORAL_TABLET | Freq: Every day | ORAL | Status: DC
  Administered 2019-01-31 – 2019-02-04 (×5): 10 mg via ORAL
  Filled 2019-01-31 (×4): qty 2

## 2019-01-31 MED ORDER — DEXAMETHASONE SODIUM PHOSPHATE 10 MG/ML IJ SOLN
6.0000 mg | Freq: Once | INTRAMUSCULAR | Status: AC
  Administered 2019-01-31: 6 mg via INTRAVENOUS
  Filled 2019-01-31: qty 1

## 2019-01-31 MED ORDER — GABAPENTIN 100 MG PO CAPS: 100.0000 mg | ORAL_CAPSULE | Freq: Every evening | ORAL | Status: DC | PRN

## 2019-01-31 MED ORDER — SODIUM CHLORIDE 0.9 % IV SOLN
200.0000 mg | Freq: Once | INTRAVENOUS | Status: AC
  Administered 2019-01-31: 200 mg via INTRAVENOUS
  Filled 2019-01-31: qty 200

## 2019-01-31 NOTE — ED Notes (Signed)
Report given to [facility]

## 2019-01-31 NOTE — H&P (Addendum)
History and Physical    Mandy [name] [id number] DOB: Apr 20, 1940 DOA: 01/31/2019  Referring MD/NP/PA: Davonna Belling, MD PCP: [name], MD  Patient coming from: Home  Chief Complaint: Confusion and weakness  I have personally briefly reviewed patient's old medical records in [place]   HPI: Mandy [name] is a 79 y.o. female from [place] with medical history significant of hypertension, hyperlipidemia, diabetes mellitus type 2, SBO, and mass of right ovary.  Presents with complaints of confusion and weakness.  History is obtained from the patient's daughter who also acts as Optometrist.  She had recently been diagnosed with COVID-19 on 1/17.  Since that time daughter reports that she has been having an intermittently productive cough.  Over the last several days she had associated symptoms of wheezing, shortness of breath, sore throat, nausea, dry heaves, dizziness/lightheaded, generalized weakness, and in the last 24-48 hours confusion.  Daughter states that she did not seem to recognize her and was unable to stand because she is so weak.  Due to the nausea symptoms she had not been eating much of anything.  Her daughter had tried what sounds like pureing her food without much success.  Denies that the patient has had any significant diarrhea symptoms.  ED Course: Upon admission into the emergency department patient was noted to be afebrile with blood pressure elevated up to 185/83, and all other vital signs maintained. CT scan of the brain did not note any acute signs of infarction or hemorrhage. Labs significant for sodium of 113, chloride 74, BUN 19, creatinine 1.05, platelets 513, and BNP 163.5.  Chest x-ray showed cardiomegaly with vascular congestion and bibasilar atelectasis.  She was given 500 mL bolus of normal saline. TRH called to admit.  Review of Systems  Constitutional: Positive for malaise/fatigue. Negative for fever.  HENT: Positive for hearing loss and  sore throat.   Eyes: Negative for pain and discharge.  Respiratory: Positive for cough, sputum production, shortness of breath and wheezing. Negative for hemoptysis.   Cardiovascular: Negative for chest pain and leg swelling.  Gastrointestinal: Positive for nausea and vomiting. Negative for abdominal pain and diarrhea.  Genitourinary: Negative for dysuria and hematuria.  Musculoskeletal: Positive for joint pain. Negative for falls.  Skin: Negative for itching.  Neurological: Positive for dizziness and weakness. Negative for focal weakness and loss of consciousness.  Psychiatric/Behavioral: Negative for hallucinations and substance abuse.       Positive for confusion    Past Medical History:  Diagnosis Date  . Aortic atherosclerosis ([place]) 10/08/2018  . Diabetes mellitus without complication ([place])   . Hepatic steatosis 10/08/2018  . Hyperlipidemia   . Hypertension   . Mass of right ovary 10/08/2018  . Osteoporosis   . SBO (small bowel obstruction) ([place]) 08/09/2018  . SBO (small bowel obstruction) ([place]) 08/09/2018    Past Surgical History:  Procedure Laterality Date  . CATARACT EXTRACTION W/ INTRAOCULAR LENS  IMPLANT, BILATERAL    . COLONOSCOPY       reports that she has never smoked. She has never used smokeless tobacco. She reports that she does not drink alcohol or use drugs.  No Known Allergies  Family History  Problem Relation Age of Onset  . Kidney disease Son   . Colon cancer Neg Hx   . Breast cancer Neg Hx     Prior to Admission medications   Medication Sig Start Date End Date Taking? Authorizing Provider  alendronate (FOSAMAX) 70 MG tablet Take 70 mg by mouth once  a week. Take with a full glass of water on an empty stomach.    [provider]  amLODipine (NORVASC) 10 MG tablet Take 10 mg by mouth daily.    [provider]  atenolol (TENORMIN) 50 MG tablet Take 50 mg by mouth 2 (two) times daily.    [provider]  calcium carbonate (OSCAL)  1500 (600 Ca) MG TABS tablet Take 600 mg of elemental calcium by mouth 2 (two) times daily with a meal.    [provider]  diclofenac sodium (VOLTAREN) 1 % GEL Apply 4 g topically 4 (four) times daily as needed for pain. 09/13/18   [provider]  gabapentin (NEURONTIN) 100 MG capsule Take 100 mg by mouth at bedtime as needed (sleep).  07/18/18   [provider]  meloxicam (MOBIC) 7.5 MG tablet Take 7.5 mg by mouth 2 (two) times daily as needed for pain.  06/20/18   [provider]  metFORMIN (GLUCOPHAGE) 500 MG tablet Take 500 mg by mouth 2 (two) times daily with a meal.     [provider]  potassium chloride (K-DUR) 10 MEQ tablet Take 10 mEq by mouth daily. 07/19/18   [provider]  simvastatin (ZOCOR) 10 MG tablet Take 10 mg by mouth at bedtime. 04/08/18   [provider]  tiZANidine (ZANAFLEX) 4 MG tablet Take 4 mg by mouth 2 (two) times daily.  07/19/18   [provider]  vitamin C (ASCORBIC ACID) 500 MG tablet Take 500 mg by mouth daily.    [provider]    Physical Exam:  Constitutional: Elderly female who appears to be resting comfortably Vitals:   01/31/19 0330 01/31/19 0345 01/31/19 0400 01/31/19 0752  BP: (!) 174/87 135/74 (!) 159/92   Pulse: 66 65 72   Resp: 16 13 20    Temp:      TempSrc:      SpO2: 97% 98% 93% 93%  Weight:      Height:       Eyes: PERRL, lids and conjunctivae normal ENMT: Mucous membranes are dry. Posterior pharynx clear of any exudate or lesions.  Neck: normal, supple, no masses, no thyromegaly.  No JVD Respiratory: clear to auscultation bilaterally, no wheezing, no crackles. Normal respiratory effort. No accessory muscle use.  Cardiovascular: Regular rate and rhythm, no murmurs / rubs / gallops. No extremity edema. 2+ pedal pulses. No carotid bruits.  Abdomen: no tenderness, no masses palpated. No hepatosplenomegaly. Bowel sounds positive.  Musculoskeletal: no clubbing /  cyanosis. No joint deformity upper and lower extremities. Good ROM, no contractures. Normal muscle tone.  Skin: no rashes, lesions, ulcers. No induration Neurologic: CN 2-12 grossly intact. Sensation intact, DTR normal.  Able to move all extremities  Psychiatric: Normal judgment and insight. Alert and oriented x person and place. Normal mood.     Labs on Admission: I have personally reviewed following labs and imaging studies  CBC: Recent Labs  Lab 01/31/19 0429 01/31/19 0434  WBC 5.4  --   NEUTROABS 3.5  --   HGB 12.4 13.9  HCT 35.2* 41.0  MCV 75.9*  --   PLT 513*  --    Basic Metabolic Panel: Recent Labs  Lab 01/31/19 0429 01/31/19 0434  NA 113* 112*  K 3.7 3.7  CL 74*  --   CO2 27  --   GLUCOSE 114*  --   BUN 10  --   CREATININE 1.05*  --   CALCIUM 9.3  --  GFR: Estimated Creatinine Clearance: 39.5 mL/min (A) (by C-G formula based on SCr of 1.05 mg/dL (H)). Liver Function Tests: Recent Labs  Lab 01/31/19 0429  AST 30  ALT 18  ALKPHOS 44  BILITOT 0.5  PROT 6.8  ALBUMIN 3.3*   No results for input(s): LIPASE, AMYLASE in the last 168 hours. No results for input(s): AMMONIA in the last 168 hours. Coagulation Profile: No results for input(s): INR, PROTIME in the last 168 hours. Cardiac Enzymes: No results for input(s): CKTOTAL, CKMB, CKMBINDEX, TROPONINI in the last 168 hours. BNP (last 3 results) No results for input(s): PROBNP in the last 8760 hours. HbA1C: No results for input(s): HGBA1C in the last 72 hours. CBG: Recent Labs  Lab 01/31/19 0428  GLUCAP 108*   Lipid Profile: Recent Labs    01/31/19 0429  TRIG 91   Thyroid Function Tests: No results for input(s): TSH, T4TOTAL, FREET4, T3FREE, THYROIDAB in the last 72 hours. Anemia Panel: Recent Labs    01/31/19 0429  FERRITIN 275   Urine analysis:    Component Value Date/Time   COLORURINE YELLOW 10/08/2018 [place] 10/08/2018 0943   LABSPEC 1.011 10/08/2018 0943    PHURINE 7.0 10/08/2018 [place] 10/08/2018 0943   HGBUR NEGATIVE 10/08/2018 [place] 10/08/2018 [place] 10/08/2018 0943   PROTEINUR NEGATIVE 10/08/2018 0943   NITRITE NEGATIVE 10/08/2018 0943   LEUKOCYTESUR NEGATIVE 10/08/2018 0943   Sepsis Labs: Recent Results (from the past 240 hour(s))  Respiratory Panel by RT PCR (Flu A&B, Covid) - Nasopharyngeal Swab     Status: Abnormal   Collection Time: 01/31/19  6:01 AM   Specimen: Nasopharyngeal Swab  Result Value Ref Range Status   SARS Coronavirus 2 by RT PCR POSITIVE (A) NEGATIVE Final    Comment: RESULT CALLED TO, READ BACK BY AND VERIFIED WITH: RN LESLIE S. [id number] FCP (NOTE) SARS-CoV-2 target nucleic acids are DETECTED. SARS-CoV-2 RNA is generally detectable in upper respiratory specimens  during the acute phase of infection. Positive results are indicative of the presence of the identified virus, but do not rule out bacterial infection or co-infection with other pathogens not detected by the test. Clinical correlation with patient history and other diagnostic information is necessary to determine patient infection status. The expected result is Negative. Fact Sheet for Patients:  PinkCheek.be Fact Sheet for Healthcare Providers: GravelBags.it This test is not yet approved or cleared by the [place] FDA and  has been authorized for detection and/or diagnosis of SARS-CoV-2 by FDA under an Emergency Use Authorization (EUA).  This EUA will remain in effect (meaning this test can be used) for the  duration of  the COVID-19 declaration under Section 564(b)(1) of the Act, 21 U.S.C. section 360bbb-3(b)(1), unless the authorization is terminated or revoked sooner.    Influenza A by PCR NEGATIVE NEGATIVE Final   Influenza B by PCR NEGATIVE NEGATIVE Final    Comment: (NOTE) The Xpert Xpress SARS-CoV-2/FLU/RSV assay is  intended as an aid in  the diagnosis of influenza from Nasopharyngeal swab specimens and  should not be used as a sole basis for treatment. Nasal washings and  aspirates are unacceptable for Xpert Xpress SARS-CoV-2/FLU/RSV  testing. Fact Sheet for Patients: PinkCheek.be Fact Sheet for Healthcare Providers: GravelBags.it This test is not yet approved or cleared by the [place] FDA and  has been authorized for detection and/or diagnosis of SARS-CoV-2 by  FDA under an Emergency Use Authorization (  EUA). This EUA will remain  in effect (meaning this test can be used) for the duration of the  Covid-19 declaration under Section 564(b)(1) of the Act, 21  U.S.C. section 360bbb-3(b)(1), unless the authorization is  terminated or revoked. Performed at [facility], [place] [address], [place], [place] [address]      Radiological Exams on Admission: CT Head Wo Contrast  Result Date: 01/31/2019 CLINICAL DATA:  Delirium. Additional history provided by technologist: Patient diagnosed with COVID 14 days ago, more confused since that time EXAM: CT HEAD WITHOUT CONTRAST TECHNIQUE: Contiguous axial images were obtained from the base of the skull through the vertex without intravenous contrast. COMPARISON:  Brain MRI 06/02/2005 FINDINGS: Brain: No evidence of acute intracranial hemorrhage. No demarcated cortical infarction. No evidence of intracranial mass. No midline shift or extra-axial fluid collection. Patchy hypoattenuation within the cerebral white matter is nonspecific, but most commonly seen on the basis of chronic small vessel ischemic disease. These white matter changes have progressed since prior MRI 06/02/2005. Mild generalized parenchymal atrophy Vascular: No hyperdense vessel.  Atherosclerotic calcifications. Skull: Normal. Negative for fracture or focal lesion. Sinuses/Orbits: Visualized orbits demonstrate no acute abnormality. Mild  ethmoid sinus mucosal thickening. No significant mastoid effusion. IMPRESSION: No evidence of acute intracranial hemorrhage or acute demarcated cortical infarction. Patchy hypoattenuation within the cerebral white matter is nonspecific, but most commonly seen on the basis of chronic small vessel ischemic disease. These white matter changes have progressed since MRI 06/02/2005. Mild generalized parenchymal atrophy. Electronically Signed   By: Kellie Simmering DO   On: 01/31/2019 07:10   DG Chest Port 1 View  Result Date: 01/31/2019 CLINICAL DATA:  Shortness of breath EXAM: PORTABLE CHEST 1 VIEW COMPARISON:  08/09/2018 FINDINGS: Cardiomegaly, vascular congestion. No overt edema. Bibasilar atelectasis. No visible effusions. No acute bony abnormality. IMPRESSION: Cardiomegaly.  Vascular congestion.  Bibasilar atelectasis. Electronically Signed   By: Rolm Baptise M.D.   On: 01/31/2019 03:32    EKG: Independently reviewed.  80 bpm with QTc 500  Assessment/Plan Hyponatremia: Acute.  Patient presents with sodium of 113.  She had received 500 mL of normal saline IV fluids in the ED with some mild improvement as reported by the patient's daughter.   -Admit to a progressive bed -Check urine osmolarity, urine sodium, and serum -BMPs every 4 hours -Goal correction of sodium no more than 10 mEq in 24-hour. -Adjust IV fluids as needed  Acute metabolic encephalopathy: Patient presents and is acutely confused per daughter.  Sodium level noted to be decreased down to 113.  CT scan of the brain did not note any acute abnormalities -Neurochecks  COVID-19 infection: Patient presents with complaints of cough, sore throat, fatigue, shortness of breath, and wheezing.  She had been diagnosed with COVID-19 initially on 1/17.  Chest x-ray noting cardiomegaly with vascular congestion and bibasilar atelectasis.  Inflammatory markers elevated including BNP, fibrinogen, and D-dimer.  Laboratory markers including CRP procalcitonin,  lactic acid, ferritin, triglycerides, and LDH within normal limits.  As patient still appears to be having symptoms may be prudent to treat. -Airborne precautions  -Albuterol inhaler every 6 hours -Dexamethasone 6 mg daily -Remdesivir per pharmacy -Vitamin C and zinc -Antitussives as needed  Cardiomegaly: Patient noted to have cardiomegaly with vascular congestion on chest x-ray.  BNP mildly elevated at 163.5.  Cardiomegaly does not appear to be new, but no normal echocardiogram on file. -Strict I&Os -Daily weights  Prolonged QT interval: QTc initially noted to be 500. -Recheck ekg  in am -Correct any electrolyte abnormalities  Essential hypertension: Home medications include Norvasc 10 mg daily, atenolol 50 mg twice daily. -Continue home regimen  Chronic kidney disease stage III: Creatinine noted to be 1.05 on admission which appears previous baseline. -Continue to monitor  Diabetes mellitus type 2 with neuropathy: Patient appears to be well controlled currently on Metformin.  On admission glucose 114.  Last hemoglobin A1c noted to be 6.1 on 08/09/2018. -Hypoglycemic protocols -Hold Metformin  -Continue gabapentin -CBGs before every meal with sensitive SSI  Dyslipidemia: Patient on simvastatin 10 mg daily. -Continue statin  Obesity: BMI 33.33 kg/m  DVT prophylaxis: Lovenox   Code Status: Full Family Communication: Discussed plan of care with the patient's daughter who is present at bedside Disposition Plan: Likely discharge home once medically stable Consults called: none Admission status: Inpatient  Norval Morton MD [facility] Pager [phone]   If 7PM-7AM, please contact night-coverage [email or web address] Password [facility]  01/31/2019, 8:18 AM

## 2019-01-31 NOTE — Progress Notes (Signed)
CRITICAL VALUE ALERT  Critical Value:  Na  118  Date & Time Notied:  01/31/19   1244  Provider Notified: [place]  Orders Received/Actions taken: Adjusted fluid rate per order

## 2019-01-31 NOTE — Progress Notes (Signed)
After 500 mL bolus of normal saline IV fluids patient repeat sodium noted increased from 113 ->118.  Labs after initial fluid bolus revealed serum osmolarity  246, urine sodium 37, and urine osmolarity 114. Based off this appears to be hypovolemic hyponatremia.  Continuing IV fluids at 50 mL/h.  Continue to monitor and adjust IV fluids as needed.

## 2019-01-31 NOTE — ED Provider Notes (Signed)
  Physical Exam  BP (!) 159/92   Pulse 72   Temp 99 F (37.2 C) (Oral)   Resp 20   Ht 4\' 11"  (1.499 m)   Wt 74.8 kg   SpO2 93%   BMI 33.33 kg/m   Physical Exam  ED Course/Procedures     Procedures  MDM  Received patient in signout.  Hyponatremia.  Some mental status changes.  Also Covid positive on the 17th.  Reportedly does not have Covid symptoms at this time.  Head CT done and reassuring.  Will admit to hospitalist       Davonna Belling, MD 01/31/19 [phone]

## 2019-01-31 NOTE — Progress Notes (Signed)
CRITICAL VALUE ALERT  Critical Value:  Serum Osmolality 246  Date & Time Notied:  01/31/19 1050  Provider Notified: [place]  Orders Received/Actions taken: Pending Na results

## 2019-01-31 NOTE — ED Triage Notes (Signed)
The pt has been coughing

## 2019-01-31 NOTE — Progress Notes (Signed)
CRITICAL VALUE ALERT  Critical Value:  Na 118  Date & Time Notied:  01/31/19   1057  Provider Notified: [place]  Orders Received/Actions taken: Adjusted fluid rate per order

## 2019-01-31 NOTE — ED Provider Notes (Signed)
[facility] EMERGENCY DEPARTMENT Provider Note   CSN: [id number] Arrival date & time: 01/31/19  0211     History Chief Complaint  Patient presents with  . Altered Mental Status  . covid for 14 days   Level 5 caveat due to altered mental status Mandy [name] is a 79 y.o. female.  The history is provided by the patient and a relative. The history is limited by the condition of the patient.  Altered Mental Status Presenting symptoms: confusion   Presenting symptoms comment:  Dizziness  Severity:  Moderate Most recent episode:  Yesterday Timing:  Constant Progression:  Unchanged Chronicity:  New  Patient with history of diabetes, hypertension, hyperlipidemia presents with confusion.  Patient was diagnosed with COVID-19 on January 17.  She had coughing but was otherwise well.  Over the past day patient is reported dizziness and has appeared confused.  No falls or injuries.  No vomiting. Daughter who is at bedside provides most of the history as patient is confused.  Patient is originally from [place]    Past Medical History:  Diagnosis Date  . Aortic atherosclerosis ([place]) 10/08/2018  . Diabetes mellitus without complication ([place])   . Hepatic steatosis 10/08/2018  . Hyperlipidemia   . Hypertension   . Mass of right ovary 10/08/2018  . Osteoporosis   . SBO (small bowel obstruction) ([place]) 08/09/2018  . SBO (small bowel obstruction) ([place]) 08/09/2018    Patient Active Problem List   Diagnosis Date Noted  . Mass of right ovary 10/08/2018  . Hepatic steatosis 10/08/2018  . Aortic atherosclerosis ([place]) 10/08/2018  . SBO (small bowel obstruction) ([place]) 08/09/2018  . Diabetes mellitus type 2 in obese ([place]) 04/07/2008  . Dyslipidemia 04/07/2008  . Essential hypertension 04/07/2008  . SORE THROAT 04/07/2008  . GERD 04/07/2008    Past Surgical History:  Procedure Laterality Date  . CATARACT EXTRACTION W/ INTRAOCULAR LENS  IMPLANT, BILATERAL    .  COLONOSCOPY       OB History   No obstetric history on file.     Family History  Problem Relation Age of Onset  . Kidney disease Son   . Colon cancer Neg Hx   . Breast cancer Neg Hx     Social History   Tobacco Use  . Smoking status: Never Smoker  . Smokeless tobacco: Never Used  Substance Use Topics  . Alcohol use: No  . Drug use: No    Home Medications Prior to Admission medications   Medication Sig Start Date End Date Taking? Authorizing Provider  alendronate (FOSAMAX) 70 MG tablet Take 70 mg by mouth once a week. Take with a full glass of water on an empty stomach.    [provider]  amLODipine (NORVASC) 10 MG tablet Take 10 mg by mouth daily.    [provider]  atenolol (TENORMIN) 50 MG tablet Take 50 mg by mouth 2 (two) times daily.    [provider]  calcium carbonate (OSCAL) 1500 (600 Ca) MG TABS tablet Take 600 mg of elemental calcium by mouth 2 (two) times daily with a meal.    [provider]  diclofenac sodium (VOLTAREN) 1 % GEL Apply 4 g topically 4 (four) times daily as needed for pain. 09/13/18   [provider]  gabapentin (NEURONTIN) 100 MG capsule Take 100 mg by mouth at bedtime as needed (sleep).  07/18/18   [provider]  meloxicam (MOBIC) 7.5 MG tablet Take 7.5 mg by mouth 2 (two)  times daily as needed for pain.  06/20/18   [provider]  metFORMIN (GLUCOPHAGE) 500 MG tablet Take 500 mg by mouth 2 (two) times daily with a meal.     [provider]  potassium chloride (K-DUR) 10 MEQ tablet Take 10 mEq by mouth daily. 07/19/18   [provider]  simvastatin (ZOCOR) 10 MG tablet Take 10 mg by mouth at bedtime. 04/08/18   [provider]  tiZANidine (ZANAFLEX) 4 MG tablet Take 4 mg by mouth 2 (two) times daily.  07/19/18   [provider]  vitamin C (ASCORBIC ACID) 500 MG tablet Take 500 mg by mouth daily.    [provider]    Allergies    Patient  has no known allergies.  Review of Systems   Review of Systems  Unable to perform ROS: Mental status change  Psychiatric/Behavioral: Positive for confusion.    Physical Exam Updated Vital Signs BP (!) 181/85 (BP Location: Right Arm)   Pulse 78   Temp 99 F (37.2 C) (Oral)   Resp 16   Ht 1.499 m (4\' 11" )   Wt 74.8 kg   SpO2 95%   BMI 33.33 kg/m   Physical Exam CONSTITUTIONAL: Elderly, ill-appearing HEAD: Normocephalic/atraumatic EYES: EOMI, no nystagmus ENMT: Mucous membranes moist NECK: supple no meningeal signs SPINE/BACK:entire spine nontender CV: S1/S2 noted, no murmurs/rubs/gallops noted LUNGS: Tachypnea, crackles bilaterally ABDOMEN: soft, nontender, no rebound or guarding, bowel sounds noted throughout abdomen NEURO: Pt is somnolent but easily arousable.  She follows all commands.  No facial droop, no arm or leg drift EXTREMITIES: pulses normal/equal, full ROM SKIN: warm, color normal PSYCH: Unable to assess ED Results / Procedures / Treatments   Labs (all labs ordered are listed, but only abnormal results are displayed) Labs Reviewed  CBC WITH DIFFERENTIAL/PLATELET - Abnormal; Notable for the following components:      Result Value   HCT 35.2 (*)    MCV 75.9 (*)    Platelets 513 (*)    Abs Immature Granulocytes 0.09 (*)    All other components within normal limits  COMPREHENSIVE METABOLIC PANEL - Abnormal; Notable for the following components:   Sodium 113 (*)    Chloride 74 (*)    Glucose, Bld 114 (*)    Creatinine, Ser 1.05 (*)    Albumin 3.3 (*)    GFR calc non Af Amer 51 (*)    GFR calc Af Amer 59 (*)    All other components within normal limits  D-DIMER, QUANTITATIVE (NOT AT [facility]) - Abnormal; Notable for the following components:   D-Dimer, Quant 2.40 (*)    All other components within normal limits  FIBRINOGEN - Abnormal; Notable for the following components:   Fibrinogen 509 (*)    All other components within normal limits  BRAIN NATRIURETIC  PEPTIDE - Abnormal; Notable for the following components:   B Natriuretic Peptide 163.5 (*)    All other components within normal limits  CBG MONITORING, ED - Abnormal; Notable for the following components:   Glucose-Capillary 108 (*)    All other components within normal limits  POCT I-STAT EG7 - Abnormal; Notable for the following components:   pH, Ven 7.463 (*)    pCO2, Ven 39.7 (*)    Bicarbonate 28.5 (*)    Acid-Base Excess 4.0 (*)    Sodium 112 (*)    Calcium, Ion 1.11 (*)    All other components within normal limits  RESPIRATORY PANEL BY RT PCR (FLU  A&B, COVID)  CULTURE, BLOOD (ROUTINE X 2)  CULTURE, BLOOD (ROUTINE X 2)  LACTIC ACID, PLASMA  LACTATE DEHYDROGENASE  FERRITIN  C-REACTIVE PROTEIN  TRIGLYCERIDES  PROCALCITONIN  URINALYSIS, ROUTINE W REFLEX MICROSCOPIC    EKG EKG Interpretation  Date/Time:  Thursday January 31 2019 02:25:20 EST Ventricular Rate:  80 PR Interval:    QRS Duration: 81 QT Interval:  433 QTC Calculation: 500 R Axis:   61 Text Interpretation: Sinus rhythm Interpretation limited secondary to artifact Nonspecific repol abnormality, lateral leads Confirmed by Ripley Fraise [phone]) on 01/31/2019 2:41:21 AM   Radiology DG Chest Port 1 View  Result Date: 01/31/2019 CLINICAL DATA:  Shortness of breath EXAM: PORTABLE CHEST 1 VIEW COMPARISON:  08/09/2018 FINDINGS: Cardiomegaly, vascular congestion. No overt edema. Bibasilar atelectasis. No visible effusions. No acute bony abnormality. IMPRESSION: Cardiomegaly.  Vascular congestion.  Bibasilar atelectasis. Electronically Signed   By: Rolm Baptise M.D.   On: 01/31/2019 03:32    Procedures .Critical Care Performed by: Ripley Fraise, MD Authorized by: Ripley Fraise, MD   Critical care provider statement:    Critical care time (minutes):  45   Critical care start time:  01/31/2019 5:15 AM   Critical care end time:  01/31/2019 6:00 AM   Critical care time was exclusive of:  Separately billable  procedures and treating other patients   Critical care was necessary to treat or prevent imminent or life-threatening deterioration of the following conditions:  Metabolic crisis and CNS failure or compromise   Critical care was time spent personally by me on the following activities:  Re-evaluation of patient's condition, review of old charts, ordering and review of laboratory studies, ordering and review of radiographic studies, evaluation of patient's response to treatment, examination of patient and development of treatment plan with patient or surrogate   I assumed direction of critical care for this patient from another provider in my specialty: no      Medications Ordered in ED Medications  sodium chloride 0.9 % bolus 500 mL (500 mLs Intravenous New Bag/Given 01/31/19 0549)    ED Course  I have reviewed the triage vital signs and the nursing notes.  Pertinent labs & imaging results that were available during my care of the patient were reviewed by me and considered in my medical decision making (see chart for details).    MDM Rules/Calculators/A&P                      3:58 AM Patient presents with dizziness and altered mental status. She was diagnosed with Covid on January 17 and had done well until the symptoms started over the past day.  She does appear confused, but no focal weakness.  History provided by daughter. Imaging and labs are pending at this time 6:35 AM Patient with significant hyponatremia which likely is leading to her altered mental status.  CT head is pending.  Suspect patient had decreased p.o. intake while convalescing with COVID-19 Patient is somnolent but easily arousable. 500 mL bolus normal saline has been ordered 6:56 AM Signed out to dr pickering with CT head pending Pt will need admission and may need ICU admit  Mandy [name] was evaluated in Emergency Department on 01/31/2019 for the symptoms described in the history of present illness. She was  evaluated in the context of the global COVID-19 pandemic, which necessitated consideration that the patient might be at risk for infection with the SARS-CoV-2 virus that causes COVID-19. Institutional protocols and algorithms that pertain to  the evaluation of patients at risk for COVID-19 are in a state of rapid change based on information released by regulatory bodies including the CDC and federal and state organizations. These policies and algorithms were followed during the patient's care in the ED.  Final Clinical Impression(s) / ED Diagnoses Final diagnoses:  Hyponatremia  COVID-19    Rx / DC Orders ED Discharge Orders    None       Ripley Fraise, MD 01/31/19 [phone]

## 2019-01-31 NOTE — ED Triage Notes (Signed)
The pt arrived by [place] from home  She was diagnosed with covid 14 days ago  She has seen her doctor and diagnosed with bronchitis .  The pt speaks no english her daughter a  The bedside is speaking for her the daughter has covid also  The pt is a little confused. She last had tylenol at 1900 for a sl elevated temp

## 2019-02-01 ENCOUNTER — Inpatient Hospital Stay (HOSPITAL_COMMUNITY): Payer: Medicare HMO

## 2019-02-01 LAB — CBC WITH DIFFERENTIAL/PLATELET
Abs Immature Granulocytes: 0.07 10*3/uL (ref 0.00–0.07)
Basophils Absolute: 0 10*3/uL (ref 0.0–0.1)
Basophils Relative: 0 %
Eosinophils Absolute: 0 10*3/uL (ref 0.0–0.5)
Eosinophils Relative: 0 %
HCT: 32.2 % — ABNORMAL LOW (ref 36.0–46.0)
Hemoglobin: 11.1 g/dL — ABNORMAL LOW (ref 12.0–15.0)
Immature Granulocytes: 2 %
Lymphocytes Relative: 17 %
Lymphs Abs: 0.7 10*3/uL (ref 0.7–4.0)
MCH: 26.4 pg (ref 26.0–34.0)
MCHC: 34.5 g/dL (ref 30.0–36.0)
MCV: 76.7 fL — ABNORMAL LOW (ref 80.0–100.0)
Monocytes Absolute: 0.3 10*3/uL (ref 0.1–1.0)
Monocytes Relative: 7 %
Neutro Abs: 3.2 10*3/uL (ref 1.7–7.7)
Neutrophils Relative %: 74 %
Platelets: 501 10*3/uL — ABNORMAL HIGH (ref 150–400)
RBC: 4.2 MIL/uL (ref 3.87–5.11)
RDW: 12.8 % (ref 11.5–15.5)
WBC: 4.2 10*3/uL (ref 4.0–10.5)
nRBC: 0 % (ref 0.0–0.2)

## 2019-02-01 LAB — BASIC METABOLIC PANEL
Anion gap: 12 (ref 5–15)
Anion gap: 13 (ref 5–15)
Anion gap: 12 (ref 5–15)
BUN: 10 mg/dL (ref 8–23)
BUN: 11 mg/dL (ref 8–23)
BUN: 19 mg/dL (ref 8–23)
CO2: 25 mmol/L (ref 22–32)
CO2: 24 mmol/L (ref 22–32)
CO2: 22 mmol/L (ref 22–32)
Calcium: 8.7 mg/dL — ABNORMAL LOW (ref 8.9–10.3)
Calcium: 9 mg/dL (ref 8.9–10.3)
Calcium: 8.8 mg/dL — ABNORMAL LOW (ref 8.9–10.3)
Chloride: 88 mmol/L — ABNORMAL LOW (ref 98–111)
Chloride: 89 mmol/L — ABNORMAL LOW (ref 98–111)
Chloride: 88 mmol/L — ABNORMAL LOW (ref 98–111)
Creatinine, Ser: 0.92 mg/dL (ref 0.44–1.00)
Creatinine, Ser: 0.97 mg/dL (ref 0.44–1.00)
Creatinine, Ser: 1.12 mg/dL — ABNORMAL HIGH (ref 0.44–1.00)
GFR calc Af Amer: >60 mL/min (ref >60)
GFR calc Af Amer: >60 mL/min (ref >60)
GFR calc Af Amer: 55 mL/min — ABNORMAL LOW (ref >60)
GFR calc non Af Amer: >60 mL/min (ref >60)
GFR calc non Af Amer: 56 mL/min — ABNORMAL LOW (ref >60)
GFR calc non Af Amer: 47 mL/min — ABNORMAL LOW (ref >60)
Glucose, Bld: 120 mg/dL — ABNORMAL HIGH (ref 70–99)
Glucose, Bld: 128 mg/dL — ABNORMAL HIGH (ref 70–99)
Glucose, Bld: 169 mg/dL — ABNORMAL HIGH (ref 70–99)
Potassium: 3.7 mmol/L (ref 3.5–5.1)
Potassium: 3.9 mmol/L (ref 3.5–5.1)
Potassium: 3.8 mmol/L (ref 3.5–5.1)
Sodium: 125 mmol/L — ABNORMAL LOW (ref 135–145)
Sodium: 126 mmol/L — ABNORMAL LOW (ref 135–145)
Sodium: 122 mmol/L — ABNORMAL LOW (ref 135–145)

## 2019-02-01 LAB — GLUCOSE, CAPILLARY
Glucose-Capillary: 109 mg/dL — ABNORMAL HIGH (ref 70–99)
Glucose-Capillary: 144 mg/dL — ABNORMAL HIGH (ref 70–99)
Glucose-Capillary: 230 mg/dL — ABNORMAL HIGH (ref 70–99)
Glucose-Capillary: 156 mg/dL — ABNORMAL HIGH (ref 70–99)

## 2019-02-01 LAB — PHOSPHORUS: Phosphorus: 3.4 mg/dL (ref 2.5–4.6)

## 2019-02-01 LAB — MAGNESIUM: Magnesium: 1.8 mg/dL (ref 1.7–2.4)

## 2019-02-01 MED ORDER — PHENOL 1.4 % MT LIQD: 1.0000 | OROMUCOSAL | Status: DC | PRN

## 2019-02-01 MED ORDER — SODIUM CHLORIDE 0.9 % IV SOLN: INTRAVENOUS | Status: DC

## 2019-02-01 MED ORDER — PANTOPRAZOLE SODIUM 40 MG PO TBEC
40.0000 mg | DELAYED_RELEASE_TABLET | Freq: Every day | ORAL | Status: DC
  Administered 2019-02-01 – 2019-02-04 (×4): 40 mg via ORAL
  Filled 2019-02-01 (×4): qty 1

## 2019-02-01 MED ORDER — TRAZODONE HCL 50 MG PO TABS
50.0000 mg | ORAL_TABLET | Freq: Every evening | ORAL | Status: DC | PRN
  Administered 2019-02-01 – 2019-02-03 (×3): 50 mg via ORAL
  Filled 2019-02-01 (×3): qty 1

## 2019-02-01 MED ORDER — SUCRALFATE 1 GM/10ML PO SUSP
1.0000 g | Freq: Three times a day (TID) | ORAL | Status: DC
  Administered 2019-02-01 – 2019-02-04 (×13): 1 g via ORAL
  Filled 2019-02-01 (×12): qty 10

## 2019-02-01 NOTE — Evaluation (Signed)
Physical Therapy Evaluation Patient Details Name: Mandy [name] MRN: [id number] DOB: 08-May-1940 Today's Date: 02/01/2019   History of Present Illness  79 year old female with prior h/o hypertension, DM type 2, SBO, hyperlipidemia admitted for confusion and generalized weakness. She was recently diagnosed with COVID 19 on 01/20/19.  On arrival to ED she was hypoxic requiring about 2 L of nasal cannula oxygen to keep sats greater than 90% CT of the head without contrast did not show any acute signs of stroke.  Clinical Impression  Patient presents with mobility limited due to weakness, decreased activity tolerance, decreased balance and continued confusion.  Could not contact her daughter via phone and not sure of interpreter available, but she eventually responded to gesturing cues. Ambulated in room with RW about 22' prior to her c/o fatigue and feeling "sick".  Feel she will benefit from skilled PT in the acute setting and follow up HHPT at d/c if daughter able to assist at d/c.      Follow Up Recommendations Home health PT    Equipment Recommendations  Rolling walker with 5" wheels(youth RW if she doesn't already have one)    Recommendations for Other Services       Precautions / Restrictions Precautions Precautions: Fall      Mobility  Bed Mobility Overal bed mobility: Needs Assistance Bed Mobility: Supine to Sit;Sit to Supine     Supine to sit: Supervision;HOB elevated Sit to supine: Min assist   General bed mobility comments: mod gesturing cues for commands for mobility; assist to guide legs into bed  Transfers Overall transfer level: Needs assistance Equipment used: None;Rolling walker (2 wheeled) Transfers: Sit to/from Stand Sit to Stand: Min assist         General transfer comment: stood x 1 no device min A and then pt returned to sitting, attempted with HHA, then pt reaching for sink with other hand so returned to sitting and used  RW  Ambulation/Gait Ambulation/Gait assistance: Min assist Gait Distance (Feet): 45 Feet Assistive device: Rolling walker (2 wheeled) Gait Pattern/deviations: Step-through pattern;Decreased stride length;Shuffle;Trunk flexed     General Gait Details: needing increased time, assist for walker management, and for safety in small space, IV noted to be out of place after ambulation, RN made aware  Stairs            Wheelchair Mobility    Modified Rankin (Stroke Patients Only)       Balance Overall balance assessment: Needs assistance   Sitting balance-Leahy Scale: Fair     Standing balance support: No upper extremity supported Standing balance-Leahy Scale: Poor Standing balance comment: stood x 1 without device/HHA and pt returned to sitting due to balance                             Pertinent Vitals/Pain Pain Assessment: No/denies pain    Home Living Family/patient expects to be discharged to:: Private residence Living Arrangements: Children                    Prior Function           Comments: unable to get home information, pt speaks dialect not on stratus ipad in room and unable to reach daughter at number in chart     Hand Dominance        Extremity/Trunk Assessment   Upper Extremity Assessment Upper Extremity Assessment: Overall WFL for tasks assessed    Lower Extremity  Assessment Lower Extremity Assessment: Generalized weakness       Communication   Communication: Prefers language other than English(Speaks [place]rio?  supposedly available under non visual portion of stratus ipad)  Cognition Arousal/Alertness: Awake/alert Behavior During Therapy: WFL for tasks assessed/performed Overall Cognitive Status: Difficult to assess                                        General Comments General comments (skin integrity, edema, etc.): stated "sick" after ambulation placed cool cloth on her face a few moments prior to  return to supine,    Exercises     Assessment/Plan    PT Assessment Patient needs continued PT services  PT Problem List Decreased cognition;Decreased activity tolerance;Decreased mobility;Decreased balance;Decreased knowledge of use of DME;Decreased strength       PT Treatment Interventions DME instruction;Therapeutic activities;Balance training;Patient/family education;Functional mobility training;Therapeutic exercise;Gait training    PT Goals (Current goals can be found in the Care Plan section)  Acute Rehab PT Goals Patient Stated Goal: none stated PT Goal Formulation: With patient Time For Goal Achievement: 02/15/19 Potential to Achieve Goals: Good    Frequency Min 3X/week   Barriers to discharge        Co-evaluation               AM-PAC PT "6 Clicks" Mobility  Outcome Measure Help needed turning from your back to your side while in a flat bed without using bedrails?: None Help needed moving from lying on your back to sitting on the side of a flat bed without using bedrails?: A Little Help needed moving to and from a bed to a chair (including a wheelchair)?: A Little Help needed standing up from a chair using your arms (e.g., wheelchair or bedside chair)?: A Little Help needed to walk in hospital room?: A Little Help needed climbing 3-5 steps with a railing? : A Little 6 Click Score: 19    End of Session   Activity Tolerance: Patient limited by fatigue Patient left: in bed;with call bell/phone within reach Nurse Communication: Other (comment)(IV dislodged) PT Visit Diagnosis: Muscle weakness (generalized) (M62.81);Other abnormalities of gait and mobility (R26.89);Other symptoms and signs involving the nervous system (R29.898)    Time: 1640-1700 PT Time Calculation (min) (ACUTE ONLY): 20 min   Charges:   PT Evaluation $PT Eval Moderate Complexity: No Name, Mandy [name] Acute Rehabilitation Services [phone] 02/01/2019   Reginia Naas 02/01/2019, 5:50 PM

## 2019-02-01 NOTE — Progress Notes (Signed)
PROGRESS NOTE    Mandy [name]  WLS:[id number] DOB: [birth year] DOA: 01/31/2019 PCP: [name], MD  Brief Narrative:   79 year old lady with prior h/o hypertension, DM type 2, SBO, hyperlipidemia admitted for confused and generalized weakness. She was recently diagnosed with COVID 19 on 01/20/19.  On arrival to ED she was hypoxic requiring about 2 L of nasal cannula oxygen to keep sats greater than 90% CT of the head without contrast did not show any acute signs of stroke.  Labs were significant for sodium of 113.  Chest x-ray showed cardiomegaly with some vascular congestion..  Assessment & Plan:   Principal Problem:   Hyponatremia Active Problems:   Diabetes mellitus type 2 in obese (HCC)   Dyslipidemia   Essential hypertension   Prolonged QT interval   Acute metabolic encephalopathy   COVID-19 virus infection   Hyponatremia Probably secondary to dehydration improving slowly with IV fluids. Sodium improved from 1 1 3-1 26.   COVID-19 viral infection Patient initially required up to 2 L of nasal cannula oxygen but we were able to wean her off the oxygen today. Continue the course of remdesivir and IV Decadron. Follow inflammatory markers Continue with bronchodilators, incentive spirometry and flutter valve.   Type 2 diabetes mellitus with neuropathy Last hemoglobin A1c is 6.7 Well-controlled with diet and Metformin. CBG (last 3)  Recent Labs    01/31/19 2022 02/01/19 0732 02/01/19 1137  GLUCAP 141* 109* 144*   Continue with sliding scale insulin.   Prolonged QT interval Repeat EKG shows improvement in QTC. Keep potassium greater than 4 and magnesium greater than 2.   Essential hypertension Well-controlled blood pressure parameters Continue with Norvasc 10 mg, atenolol 50 mg twice daily.   Throat pain during swallowing Ordered sucralfate.  Cepacol  as needed.     Abnormal EKG with ST and T wave changes. Patient denies any chest pain or shortness of  breath. Echocardiogram ordered.    Acute metabolic encephalopathy Resolved Patient is alert and oriented and answering questions appropriately   DVT prophylaxis: Lovenox Code Status: Full code Family Communication: Family at bedside Disposition Plan:  . Patient came from: Home           . Anticipated d/c place: Pending PT evaluation . Barriers to d/c OR conditions which need to be met to effect a safe d/c: Clinical improvement hyponatremia   Consultants:   None  Procedures: None Antimicrobials: None Subjective:no chest pain.  Pain during swallowing.  No sob .   Objective: Vitals:   02/01/19 0348 02/01/19 0454 02/01/19 0558 02/01/19 0732  BP: 129/74   122/63  Pulse: 69  71 72  Resp: '12  14 18  ' Temp: 98.5 F (36.9 C)   98.4 F (36.9 C)  TempSrc: Oral   Oral  SpO2: 100%  100% 99%  Weight:  73 kg    Height:        Intake/Output Summary (Last 24 hours) at 02/01/2019 1116 Last data filed at 02/01/2019 0400 Gross per 24 hour  Intake 1022.16 ml  Output 1500 ml  Net -477.84 ml   Filed Weights   01/31/19 0234 02/01/19 0454  Weight: 74.8 kg 73 kg    Examination:  General exam: Appears calm and comfortable  Respiratory system: Clear to auscultation. Respiratory effort normal. Cardiovascular system: S1 & S2 heard, RRR. No JVD,  No pedal edema. Gastrointestinal system: Abdomen is nondistended, soft and nontender.  Normal bowel sounds heard. Central nervous system: Alert and oriented. No  focal neurological deficits. Extremities: Symmetric 5 x 5 power. Skin: No rashes, lesions or ulcers Psychiatry: . Mood & affect appropriate.     Data Reviewed: I have personally reviewed following labs and imaging studies  CBC: Recent Labs  Lab 01/31/19 0429 01/31/19 0434 02/01/19 0306  WBC 5.4  --  4.2  NEUTROABS 3.5  --  3.2  HGB 12.4 13.9 11.1*  HCT 35.2* 41.0 32.2*  MCV 75.9*  --  76.7*  PLT 513*  --  695*   Basic Metabolic Panel: Recent Labs  Lab  01/31/19 0956 01/31/19 1141 01/31/19 1622 01/31/19 2005 02/01/19 0306  NA 118* 118* 121* 119* 125*  K 3.7 3.6 4.3 4.1 3.7  CL 80* 80* 84* 83* 88*  CO2 '28 24 25 25 25  ' GLUCOSE 106* 113* 162* 144* 120*  BUN '9 8 10 10 10  ' CREATININE 0.96 0.96 1.03* 0.98 0.92  CALCIUM 8.9 8.9 8.9 9.0 8.7*  MG  --   --   --   --  1.8  PHOS  --   --   --   --  3.4   GFR: Estimated Creatinine Clearance: 44.5 mL/min (by C-G formula based on SCr of 0.92 mg/dL). Liver Function Tests: Recent Labs  Lab 01/31/19 0429  AST 30  ALT 18  ALKPHOS 44  BILITOT 0.5  PROT 6.8  ALBUMIN 3.3*   No results for input(s): LIPASE, AMYLASE in the last 168 hours. No results for input(s): AMMONIA in the last 168 hours. Coagulation Profile: No results for input(s): INR, PROTIME in the last 168 hours. Cardiac Enzymes: No results for input(s): CKTOTAL, CKMB, CKMBINDEX, TROPONINI in the last 168 hours. BNP (last 3 results) No results for input(s): PROBNP in the last 8760 hours. HbA1C: Recent Labs    01/31/19 0956  HGBA1C 6.7*   CBG: Recent Labs  Lab 01/31/19 0428 01/31/19 1137 01/31/19 1601 01/31/19 2022 02/01/19 0732  GLUCAP 108* 129* 169* 141* 109*   Lipid Profile: Recent Labs    01/31/19 0429  TRIG 91   Thyroid Function Tests: No results for input(s): TSH, T4TOTAL, FREET4, T3FREE, THYROIDAB in the last 72 hours. Anemia Panel: Recent Labs    01/31/19 0429  FERRITIN 275   Sepsis Labs: Recent Labs  Lab 01/31/19 0429  PROCALCITON <0.10  LATICACIDVEN 1.1    Recent Results (from the past 240 hour(s))  Blood Culture (routine x 2)     Status: None (Preliminary result)   Collection Time: 01/31/19  4:15 AM   Specimen: BLOOD  Result Value Ref Range Status   Specimen Description BLOOD RIGHT HAND  Final   Special Requests   Final    BOTTLES DRAWN AEROBIC ONLY Blood Culture adequate volume   Culture   Final    NO GROWTH 1 DAY Performed at [facility], [place] [address], [place], [place]  [address]    Report Status PENDING  Incomplete  Blood Culture (routine x 2)     Status: None (Preliminary result)   Collection Time: 01/31/19  4:24 AM   Specimen: BLOOD  Result Value Ref Range Status   Specimen Description BLOOD RIGHT ARM  Final   Special Requests   Final    BOTTLES DRAWN AEROBIC AND ANAEROBIC Blood Culture adequate volume   Culture   Final    NO GROWTH 1 DAY Performed at [facility], [place] [address] [place], [place] [address]    Report Status PENDING  Incomplete  Respiratory Panel by RT  PCR (Flu A&B, Covid) - Nasopharyngeal Swab     Status: Abnormal   Collection Time: 01/31/19  6:01 AM   Specimen: Nasopharyngeal Swab  Result Value Ref Range Status   SARS Coronavirus 2 by RT PCR POSITIVE (A) NEGATIVE Final    Comment: RESULT CALLED TO, READ BACK BY AND VERIFIED WITH: RN LESLIE S. [id number] FCP (NOTE) SARS-CoV-2 target nucleic acids are DETECTED. SARS-CoV-2 RNA is generally detectable in upper respiratory specimens  during the acute phase of infection. Positive results are indicative of the presence of the identified virus, but do not rule out bacterial infection or co-infection with other pathogens not detected by the test. Clinical correlation with patient history and other diagnostic information is necessary to determine patient infection status. The expected result is Negative. Fact Sheet for Patients:  PinkCheek.be Fact Sheet for Healthcare Providers: GravelBags.it This test is not yet approved or cleared by the [place] FDA and  has been authorized for detection and/or diagnosis of SARS-CoV-2 by FDA under an Emergency Use Authorization (EUA).  This EUA will remain in effect (meaning this test can be used) for the  duration of  the COVID-19 declaration under Section 564(b)(1) of the Act, 21 U.S.C. section 360bbb-3(b)(1), unless the authorization is terminated or revoked sooner.     Influenza A by PCR NEGATIVE NEGATIVE Final   Influenza B by PCR NEGATIVE NEGATIVE Final    Comment: (NOTE) The Xpert Xpress SARS-CoV-2/FLU/RSV assay is intended as an aid in  the diagnosis of influenza from Nasopharyngeal swab specimens and  should not be used as a sole basis for treatment. Nasal washings and  aspirates are unacceptable for Xpert Xpress SARS-CoV-2/FLU/RSV  testing. Fact Sheet for Patients: PinkCheek.be Fact Sheet for Healthcare Providers: GravelBags.it This test is not yet approved or cleared by the [place] FDA and  has been authorized for detection and/or diagnosis of SARS-CoV-2 by  FDA under an Emergency Use Authorization (EUA). This EUA will remain  in effect (meaning this test can be used) for the duration of the  Covid-19 declaration under Section 564(b)(1) of the Act, 21  U.S.C. section 360bbb-3(b)(1), unless the authorization is  terminated or revoked. Performed at [facility], [place] [address] [place], [place] [address]          Radiology Studies: CT Head Wo Contrast  Result Date: 01/31/2019 CLINICAL DATA:  Delirium. Additional history provided by technologist: Patient diagnosed with COVID 14 days ago, more confused since that time EXAM: CT HEAD WITHOUT CONTRAST TECHNIQUE: Contiguous axial images were obtained from the base of the skull through the vertex without intravenous contrast. COMPARISON:  Brain MRI 06/02/2005 FINDINGS: Brain: No evidence of acute intracranial hemorrhage. No demarcated cortical infarction. No evidence of intracranial mass. No midline shift or extra-axial fluid collection. Patchy hypoattenuation within the cerebral white matter is nonspecific, but most commonly seen on the basis of chronic small vessel ischemic disease. These white matter changes have progressed since prior MRI 06/02/2005. Mild generalized parenchymal atrophy Vascular: No hyperdense vessel.   Atherosclerotic calcifications. Skull: Normal. Negative for fracture or focal lesion. Sinuses/Orbits: Visualized orbits demonstrate no acute abnormality. Mild ethmoid sinus mucosal thickening. No significant mastoid effusion. IMPRESSION: No evidence of acute intracranial hemorrhage or acute demarcated cortical infarction. Patchy hypoattenuation within the cerebral white matter is nonspecific, but most commonly seen on the basis of chronic small vessel ischemic disease. These white matter changes have progressed since MRI 06/02/2005. Mild generalized parenchymal atrophy. Electronically Signed   By: Marylyn Ishihara  Golden DO   On: 01/31/2019 07:10   DG Chest Port 1 View  Result Date: 01/31/2019 CLINICAL DATA:  Shortness of breath EXAM: PORTABLE CHEST 1 VIEW COMPARISON:  08/09/2018 FINDINGS: Cardiomegaly, vascular congestion. No overt edema. Bibasilar atelectasis. No visible effusions. No acute bony abnormality. IMPRESSION: Cardiomegaly.  Vascular congestion.  Bibasilar atelectasis. Electronically Signed   By: Rolm Baptise M.D.   On: 01/31/2019 03:32        Scheduled Meds: . albuterol  2 puff Inhalation Q6H  . amLODipine  10 mg Oral Daily  . vitamin C  500 mg Oral Daily  . atenolol  50 mg Oral BID  . dexamethasone  6 mg Oral Daily  . enoxaparin (LOVENOX) injection  40 mg Subcutaneous Daily  . insulin aspart  0-9 Units Subcutaneous TID WC  . sodium chloride flush  3 mL Intravenous Q12H  . zinc sulfate  220 mg Oral Daily   Continuous Infusions: . sodium chloride 10 mL/hr at 01/31/19 1728  . remdesivir 100 mg in NS 100 mL 100 mg (02/01/19 1021)     LOS: 1 day        Hosie Poisson, MD [facility]   To contact the attending provider between 7A-7P or the covering provider during after hours 7P-7A, please log into the web site [email or web address] and access using universal [place] password for that web site. If you do not have the password, please call the hospital operator.  02/01/2019, 11:16  AM

## 2019-02-01 NOTE — Evaluation (Signed)
Clinical/Bedside Swallow Evaluation Patient Details  Name: Mandy [name] MRN: [id number] Date of Birth: 04/25/1940  Today's Date: 02/01/2019 Time: SLP Start Time (ACUTE ONLY): [id number] SLP Stop Time (ACUTE ONLY): 1600 SLP Time Calculation (min) (ACUTE ONLY): 22 min  Past Medical History:  Past Medical History:  Diagnosis Date  . Aortic atherosclerosis ([place]) 10/08/2018  . Diabetes mellitus without complication ([place])   . Hepatic steatosis 10/08/2018  . Hyperlipidemia   . Hypertension   . Mass of right ovary 10/08/2018  . Osteoporosis   . SBO (small bowel obstruction) ([place]) 08/09/2018  . SBO (small bowel obstruction) ([place]) 08/09/2018   Past Surgical History:  Past Surgical History:  Procedure Laterality Date  . CATARACT EXTRACTION W/ INTRAOCULAR LENS  IMPLANT, BILATERAL    . COLONOSCOPY     HPI:  79 year old female with prior h/o hypertension, DM type 2, SBO, hyperlipidemia admitted for confusion and generalized weakness. She was recently diagnosed with COVID 19 on 01/20/19.  On arrival to ED she was hypoxic requiring about 2 L of nasal cannula oxygen to keep sats greater than 90% CT of the head without contrast did not show any acute signs of stroke.   Assessment / Plan / Recommendation Clinical Impression  Pt participated in clinical swallow evaluation; unable to reach daughter via phone.  Pt with no obvious focal deficits.  She was observed during consumption of 4 oz room temperature water and 4 oz of applesauce with adequate mastication, brisk swallow response, and no s/s of aspiration.  No oral residue post-swallow. Voice was clear and of good quality post-swallow. She declined regular solids.  RR was well within parameters for anticipated coordination of respiratory/swallow sequence.  No s/s of dysphagia.  Recommend continuing current diet (regular solids and thin liquids). No SLP f/u is warranted.  SLP Visit Diagnosis: Dysphagia, unspecified (R13.10)    Aspiration Risk  No limitations    Diet Recommendation   regular solids, thin liquids  Medication Administration: Whole meds with liquid    Other  Recommendations Oral Care Recommendations: Oral care BID   Follow up Recommendations None      Frequency and Duration            Prognosis        Swallow Study   General HPI: 79 year old female with prior h/o hypertension, DM type 2, SBO, hyperlipidemia admitted for confusion and generalized weakness. She was recently diagnosed with COVID 19 on 01/20/19.  On arrival to ED she was hypoxic requiring about 2 L of nasal cannula oxygen to keep sats greater than 90% CT of the head without contrast did not show any acute signs of stroke. Type of Study: Bedside Swallow Evaluation Previous Swallow Assessment: no Diet Prior to this Study: Regular;Thin liquids Temperature Spikes Noted: No Respiratory Status: Nasal cannula(1 L) History of Recent Intubation: No Behavior/Cognition: Alert;Cooperative Oral Cavity Assessment: Within Functional Limits Oral Care Completed by SLP: No Oral Cavity - Dentition: Poor condition Vision: Functional for self-feeding Self-Feeding Abilities: Able to feed self Patient Positioning: Upright in bed Baseline Vocal Quality: Normal Volitional Cough: Strong Volitional Swallow: Able to elicit    Oral/Motor/Sensory Function Overall Oral Motor/Sensory Function: Within functional limits   Ice Chips Ice chips: Within functional limits   Thin Liquid Thin Liquid: Within functional limits    Nectar Thick Nectar Thick Liquid: Not tested   Honey Thick Honey Thick Liquid: Not tested   Puree Puree: Within functional limits   Solid     Solid: (pt  declined)      Juan Quam Laurice 02/01/2019,4:08 PM  Estill Bamberg L. Tivis Ringer, [place] Office number [phone]

## 2019-02-02 ENCOUNTER — Inpatient Hospital Stay (HOSPITAL_COMMUNITY): Payer: Medicare HMO

## 2019-02-02 LAB — BASIC METABOLIC PANEL
Anion gap: 10 (ref 5–15)
Anion gap: 11 (ref 5–15)
BUN: 15 mg/dL (ref 8–23)
BUN: 16 mg/dL (ref 8–23)
CO2: 25 mmol/L (ref 22–32)
CO2: 24 mmol/L (ref 22–32)
Calcium: 8.8 mg/dL — ABNORMAL LOW (ref 8.9–10.3)
Calcium: 8.4 mg/dL — ABNORMAL LOW (ref 8.9–10.3)
Chloride: 95 mmol/L — ABNORMAL LOW (ref 98–111)
Chloride: 94 mmol/L — ABNORMAL LOW (ref 98–111)
Creatinine, Ser: 1.07 mg/dL — ABNORMAL HIGH (ref 0.44–1.00)
Creatinine, Ser: 1.24 mg/dL — ABNORMAL HIGH (ref 0.44–1.00)
GFR calc Af Amer: 58 mL/min — ABNORMAL LOW (ref >60)
GFR calc Af Amer: 49 mL/min — ABNORMAL LOW (ref >60)
GFR calc non Af Amer: 50 mL/min — ABNORMAL LOW (ref >60)
GFR calc non Af Amer: 42 mL/min — ABNORMAL LOW (ref >60)
Glucose, Bld: 119 mg/dL — ABNORMAL HIGH (ref 70–99)
Glucose, Bld: 214 mg/dL — ABNORMAL HIGH (ref 70–99)
Potassium: 3.9 mmol/L (ref 3.5–5.1)
Potassium: 3.7 mmol/L (ref 3.5–5.1)
Sodium: 130 mmol/L — ABNORMAL LOW (ref 135–145)
Sodium: 129 mmol/L — ABNORMAL LOW (ref 135–145)

## 2019-02-02 LAB — C-REACTIVE PROTEIN: CRP: 0.6 mg/dL (ref <1.0)

## 2019-02-02 LAB — GLUCOSE, CAPILLARY
Glucose-Capillary: 97 mg/dL (ref 70–99)
Glucose-Capillary: 197 mg/dL — ABNORMAL HIGH (ref 70–99)
Glucose-Capillary: 199 mg/dL — ABNORMAL HIGH (ref 70–99)
Glucose-Capillary: 161 mg/dL — ABNORMAL HIGH (ref 70–99)

## 2019-02-02 LAB — T4, FREE: Free T4: 1.39 ng/dL — ABNORMAL HIGH (ref 0.61–1.12)

## 2019-02-02 LAB — TSH: TSH: 0.06 u[IU]/mL — ABNORMAL LOW (ref 0.350–4.500)

## 2019-02-02 MED ORDER — ALBUTEROL SULFATE HFA 108 (90 BASE) MCG/ACT IN AERS: 2.0000 | INHALATION_SPRAY | Freq: Four times a day (QID) | RESPIRATORY_TRACT | Status: DC | PRN

## 2019-02-02 NOTE — Progress Notes (Signed)
Attempt to return daughter phone call *2. Busy signal obtained.

## 2019-02-02 NOTE — Procedures (Signed)
Tried using interpreter and family to translate, but neither are available at this time.   Tried asking patient in English about exam, but she did not understand.   Please contact echo department when an interpreter is available as this exam has been attempted 3 times.

## 2019-02-02 NOTE — Progress Notes (Signed)
PROGRESS NOTE    Mandy [name]  [id number] DOB: April 09, 1940 DOA: 01/31/2019 PCP: [name], MD  Brief Narrative:   79 year old lady with prior h/o hypertension, DM type 2, SBO, hyperlipidemia admitted for confused and generalized weakness. She was recently diagnosed with COVID 19 on 01/20/19.  On arrival to ED she was hypoxic requiring about 2 L of nasal cannula oxygen to keep sats greater than 90% CT of the head without contrast did not show any acute signs of stroke.  Labs were significant for sodium of 113.  Chest x-ray showed cardiomegaly with some vascular congestion.. We wer able to wean her off oxygen, and she reports breathing has improved, still coughing.   Assessment & Plan:   Principal Problem:   Hyponatremia Active Problems:   Diabetes mellitus type 2 in obese (HCC)   Dyslipidemia   Essential hypertension   Prolonged QT interval   Acute metabolic encephalopathy   COVID-19 virus infection   Hyponatremia Probably secondary to dehydration improving slowly with IV fluids. Sodium improved from 113 to 129 today.     Abnormal TSH,  Get free t4 levels.    COVID-19 viral infection Patient initially required up to 2 L of nasal cannula oxygen but we were able to wean her off the oxygen today. Continue the course of remdesivir and IV Decadron. Day 3 /5 of Remdisivir and Day 3/10 of Decadron.  Follow inflammatory markers, CRP on admission was 0.8 and normal. Lactic acid wnl. nrmal pro calcitonin.  Continue with bronchodilators, incentive spirometry and flutter valve.   Type 2 diabetes mellitus with neuropathy Last hemoglobin A1c is 6.7 Well-controlled with diet and Metformin. CBG (last 3)  Recent Labs    02/01/19 2100 02/02/19 0846 02/02/19 1152  GLUCAP 156* 97 197*   Continue with sliding scale insulin. No changes in meds.    Prolonged QT interval Repeat EKG shows improvement in QTC. Keep potassium greater than 4 and magnesium greater than  2.   Essential hypertension Well controlled.  Continue with Norvasc 10 mg, atenolol 50 mg twice daily.   Throat pain during swallowing Resolved.  Ordered sucralfate.  Cepacol  as needed.    Abnormal EKG with ST and T wave changes. Patient denies any chest pain or shortness of breath. Echocardiogram ordered and is pending.  Repeat EKG today.     Acute metabolic encephalopathy Resolved Patient is alert and oriented and answering questions appropriately   DVT prophylaxis: Lovenox Code Status: Full code Family Communication: Family at bedside Disposition Plan:  . Patient came from: Home           . Anticipated d/c place: [facility] . Barriers to d/c OR conditions which need to be met to effect a safe d/c: Clinical improvement improvement in hyponatremia, echocardiogram for evaluation of abnormal EKG.    Consultants:   None  Procedures: None Antimicrobials: None Subjective:no chest pain.  NO CHEST PAIN or sob, is tachypneic, but not in distress.  No nausea, vomiting, following commands and answering questions appropriately.  Objective: Vitals:   02/02/19 0003 02/02/19 0500 02/02/19 0848 02/02/19 0934  BP: (!) 139/58 (!) 145/56 138/65   Pulse: 62 67 62   Resp: '19 18 20   ' Temp: 97.9 F (36.6 C) 98.8 F (37.1 C) 98 F (36.7 C) 98 F (36.7 C)  TempSrc: Oral Oral Oral Oral  SpO2: 100% 99% 99%   Weight:  73.2 kg    Height:        Intake/Output Summary (  Last 24 hours) at 02/02/2019 1238 Last data filed at 02/02/2019 0725 Gross per 24 hour  Intake 720.83 ml  Output 1900 ml  Net -1179.17 ml   Filed Weights   01/31/19 0234 02/01/19 0454 02/02/19 0500  Weight: 74.8 kg 73 kg 73.2 kg    Examination:  General exam: Calm and comfortable on room air with sats greater than 90% Respiratory system: Air entry fair bilateral, no wheezing or rhonchi Cardiovascular system: S1-S2 heard, regular rate rhythm, no JVD, no pedal edema. Gastrointestinal system: Abdomen is  soft, nontender nondistended bowel sounds normal Central nervous system: Alert and oriented, grossly nonfocal Extremities: No pedal edema, cyanosis Skin: No rashes or lesions Psychiatry: .  Mood is appropriate    Data Reviewed: I have personally reviewed following labs and imaging studies  CBC: Recent Labs  Lab 01/31/19 0429 01/31/19 0434 02/01/19 0306  WBC 5.4  --  4.2  NEUTROABS 3.5  --  3.2  HGB 12.4 13.9 11.1*  HCT 35.2* 41.0 32.2*  MCV 75.9*  --  76.7*  PLT 513*  --  696*   Basic Metabolic Panel: Recent Labs  Lab 02/01/19 0306 02/01/19 1101 02/01/19 1946 02/02/19 0521 02/02/19 1134  NA 125* 126* 122* 130* 129*  K 3.7 3.9 3.8 3.9 3.7  CL 88* 89* 88* 95* 94*  CO2 '25 24 22 25 24  ' GLUCOSE 120* 128* 169* 119* 214*  BUN '10 11 19 15 16  ' CREATININE 0.92 0.97 1.12* 1.07* 1.24*  CALCIUM 8.7* 9.0 8.8* 8.8* 8.4*  MG 1.8  --   --   --   --   PHOS 3.4  --   --   --   --    GFR: Estimated Creatinine Clearance: 33.1 mL/min (A) (by C-G formula based on SCr of 1.24 mg/dL (H)). Liver Function Tests: Recent Labs  Lab 01/31/19 0429  AST 30  ALT 18  ALKPHOS 44  BILITOT 0.5  PROT 6.8  ALBUMIN 3.3*   No results for input(s): LIPASE, AMYLASE in the last 168 hours. No results for input(s): AMMONIA in the last 168 hours. Coagulation Profile: No results for input(s): INR, PROTIME in the last 168 hours. Cardiac Enzymes: No results for input(s): CKTOTAL, CKMB, CKMBINDEX, TROPONINI in the last 168 hours. BNP (last 3 results) No results for input(s): PROBNP in the last 8760 hours. HbA1C: Recent Labs    01/31/19 0956  HGBA1C 6.7*   CBG: Recent Labs  Lab 02/01/19 1137 02/01/19 1708 02/01/19 2100 02/02/19 0846 02/02/19 1152  GLUCAP 144* 230* 156* 97 197*   Lipid Profile: Recent Labs    01/31/19 0429  TRIG 91   Thyroid Function Tests: Recent Labs    02/02/19 0521  TSH 0.060*   Anemia Panel: Recent Labs    01/31/19 0429  FERRITIN 275   Sepsis  Labs: Recent Labs  Lab 01/31/19 0429  PROCALCITON <0.10  LATICACIDVEN 1.1    Recent Results (from the past 240 hour(s))  Blood Culture (routine x 2)     Status: None (Preliminary result)   Collection Time: 01/31/19  4:15 AM   Specimen: BLOOD  Result Value Ref Range Status   Specimen Description BLOOD RIGHT HAND  Final   Special Requests   Final    BOTTLES DRAWN AEROBIC ONLY Blood Culture adequate volume   Culture   Final    NO GROWTH 1 DAY Performed at [facility], [place] [address] [place], [place] [address]    Report Status PENDING  Incomplete  Blood Culture (routine x 2)     Status: None (Preliminary result)   Collection Time: 01/31/19  4:24 AM   Specimen: BLOOD  Result Value Ref Range Status   Specimen Description BLOOD RIGHT ARM  Final   Special Requests   Final    BOTTLES DRAWN AEROBIC AND ANAEROBIC Blood Culture adequate volume   Culture   Final    NO GROWTH 1 DAY Performed at [facility], [address], [place], [place] [address]    Report Status PENDING  Incomplete  Respiratory Panel by RT PCR (Flu A&B, Covid) - Nasopharyngeal Swab     Status: Abnormal   Collection Time: 01/31/19  6:01 AM   Specimen: Nasopharyngeal Swab  Result Value Ref Range Status   SARS Coronavirus 2 by RT PCR POSITIVE (A) NEGATIVE Final    Comment: RESULT CALLED TO, READ BACK BY AND VERIFIED WITH: RN LESLIE S. [id number] FCP (NOTE) SARS-CoV-2 target nucleic acids are DETECTED. SARS-CoV-2 RNA is generally detectable in upper respiratory specimens  during the acute phase of infection. Positive results are indicative of the presence of the identified virus, but do not rule out bacterial infection or co-infection with other pathogens not detected by the test. Clinical correlation with patient history and other diagnostic information is necessary to determine patient infection status. The expected result is Negative. Fact Sheet for Patients:   PinkCheek.be Fact Sheet for Healthcare Providers: GravelBags.it This test is not yet approved or cleared by the [place] FDA and  has been authorized for detection and/or diagnosis of SARS-CoV-2 by FDA under an Emergency Use Authorization (EUA).  This EUA will remain in effect (meaning this test can be used) for the  duration of  the COVID-19 declaration under Section 564(b)(1) of the Act, 21 U.S.C. section 360bbb-3(b)(1), unless the authorization is terminated or revoked sooner.    Influenza A by PCR NEGATIVE NEGATIVE Final   Influenza B by PCR NEGATIVE NEGATIVE Final    Comment: (NOTE) The Xpert Xpress SARS-CoV-2/FLU/RSV assay is intended as an aid in  the diagnosis of influenza from Nasopharyngeal swab specimens and  should not be used as a sole basis for treatment. Nasal washings and  aspirates are unacceptable for Xpert Xpress SARS-CoV-2/FLU/RSV  testing. Fact Sheet for Patients: PinkCheek.be Fact Sheet for Healthcare Providers: GravelBags.it This test is not yet approved or cleared by the [place] FDA and  has been authorized for detection and/or diagnosis of SARS-CoV-2 by  FDA under an Emergency Use Authorization (EUA). This EUA will remain  in effect (meaning this test can be used) for the duration of the  Covid-19 declaration under Section 564(b)(1) of the Act, 21  U.S.C. section 360bbb-3(b)(1), unless the authorization is  terminated or revoked. Performed at [facility], [place] [address], [place], [place] [address]          Radiology Studies: No results found.      Scheduled Meds: . amLODipine  10 mg Oral Daily  . vitamin C  500 mg Oral Daily  . atenolol  50 mg Oral BID  . dexamethasone  6 mg Oral Daily  . enoxaparin (LOVENOX) injection  40 mg Subcutaneous Daily  . insulin aspart  0-9 Units Subcutaneous TID WC  . pantoprazole   40 mg Oral Q0600  . sodium chloride flush  3 mL Intravenous Q12H  . sucralfate  1 g Oral TID WC & HS  . zinc sulfate  220 mg Oral Daily  Continuous Infusions: . sodium chloride 10 mL/hr at 01/31/19 1728  . remdesivir 100 mg in NS 100 mL Stopped (02/02/19 1102)     LOS: 2 days        Hosie Poisson, MD [facility]   To contact the attending provider between 7A-7P or the covering provider during after hours 7P-7A, please log into the web site [email or web address] and access using universal  password for that web site. If you do not have the password, please call the hospital operator.  02/02/2019, 12:38 PM

## 2019-02-02 NOTE — Progress Notes (Signed)
Pt repeatedly asking about her glasses this evening and even making comments that she thinks her glasses were thrown into the trash by hospital staff. In pt's chart it is only documented that she had clothing with her at time of admission. At this time pt's daughter, Mandy [name], called and confirmed that pt's glasses are at the pt's home. This information is also conveyed to the pt.

## 2019-02-03 ENCOUNTER — Inpatient Hospital Stay (HOSPITAL_COMMUNITY): Payer: Medicare HMO

## 2019-02-03 DIAGNOSIS — I361 Nonrheumatic tricuspid (valve) insufficiency: Secondary | ICD-10-CM

## 2019-02-03 DIAGNOSIS — I340 Nonrheumatic mitral (valve) insufficiency: Secondary | ICD-10-CM

## 2019-02-03 LAB — BASIC METABOLIC PANEL
Anion gap: 6 (ref 5–15)
BUN: 17 mg/dL (ref 8–23)
CO2: 24 mmol/L (ref 22–32)
Calcium: 8.2 mg/dL — ABNORMAL LOW (ref 8.9–10.3)
Chloride: 97 mmol/L — ABNORMAL LOW (ref 98–111)
Creatinine, Ser: 1.07 mg/dL — ABNORMAL HIGH (ref 0.44–1.00)
GFR calc Af Amer: 58 mL/min — ABNORMAL LOW (ref >60)
GFR calc non Af Amer: 50 mL/min — ABNORMAL LOW (ref >60)
Glucose, Bld: 123 mg/dL — ABNORMAL HIGH (ref 70–99)
Potassium: 4 mmol/L (ref 3.5–5.1)
Sodium: 127 mmol/L — ABNORMAL LOW (ref 135–145)

## 2019-02-03 LAB — ECHOCARDIOGRAM LIMITED
Height: 59 in
Weight: 2582.03 oz

## 2019-02-03 LAB — GLUCOSE, CAPILLARY
Glucose-Capillary: 102 mg/dL — ABNORMAL HIGH (ref 70–99)
Glucose-Capillary: 113 mg/dL — ABNORMAL HIGH (ref 70–99)
Glucose-Capillary: 137 mg/dL — ABNORMAL HIGH (ref 70–99)
Glucose-Capillary: 161 mg/dL — ABNORMAL HIGH (ref 70–99)
Glucose-Capillary: 226 mg/dL — ABNORMAL HIGH (ref 70–99)

## 2019-02-03 LAB — OSMOLALITY, URINE: Osmolality, Ur: 292 mOsm/kg — ABNORMAL LOW (ref 300–900)

## 2019-02-03 LAB — CBC
HCT: 31.2 % — ABNORMAL LOW (ref 36.0–46.0)
Hemoglobin: 10.6 g/dL — ABNORMAL LOW (ref 12.0–15.0)
MCH: 27 pg (ref 26.0–34.0)
MCHC: 34 g/dL (ref 30.0–36.0)
MCV: 79.4 fL — ABNORMAL LOW (ref 80.0–100.0)
Platelets: 467 10*3/uL — ABNORMAL HIGH (ref 150–400)
RBC: 3.93 MIL/uL (ref 3.87–5.11)
RDW: 13.7 % (ref 11.5–15.5)
WBC: 6.3 10*3/uL (ref 4.0–10.5)
nRBC: 0 % (ref 0.0–0.2)

## 2019-02-03 LAB — IRON AND TIBC
Iron: 61 ug/dL (ref 28–170)
Saturation Ratios: 20 % (ref 10.4–31.8)
TIBC: 300 ug/dL (ref 250–450)
UIBC: 239 ug/dL

## 2019-02-03 LAB — SODIUM, URINE, RANDOM: Sodium, Ur: 105 mmol/L

## 2019-02-03 MED ORDER — FUROSEMIDE 10 MG/ML IJ SOLN
20.0000 mg | Freq: Once | INTRAMUSCULAR | Status: AC
  Administered 2019-02-03: 20 mg via INTRAVENOUS
  Filled 2019-02-03: qty 2

## 2019-02-03 NOTE — Evaluation (Signed)
Occupational Therapy Evaluation Patient Details Name: Mandy [name] MRN: [id number] DOB: July 18, 1940 Today's Date: 02/03/2019    History of Present Illness 79 year old female with prior h/o hypertension, DM type 2, SBO, hyperlipidemia admitted for confusion and generalized weakness. She was recently diagnosed with COVID 19 on 01/20/19.  On arrival to ED she was hypoxic requiring about 2 L of nasal cannula oxygen to keep sats greater than 90% CT of the head without contrast did not show any acute signs of stroke.   Clinical Impression   This 79 y/o female presents with the above. Pt presents supine in bed pleasant and willing to work with therapist. Pt able to understand some of therapist's questions and also able to follow basic gestures to perform mobility/functional tasks today. Pt tolerating room level mobility using RW, completing multiple laps in room with overall minA; pt requiring cues to self-monitor and initiate seated rest break; she reports DOE with activity (grossly 2/4). She currently requires setup assist for seated UB ADL, minA for LB ADL. Pt completing activity on RA, difficult to obtain good pleth during entire duration of mobility but when able to get accurate reading O2 sats mostly maintaining at >/=88%. Pt will benefit from continued acute OT services, recommend follow up [facility] services after discharge to maximize her safety and independence with ADL and mobility. Will follow.   Follow Up Recommendations  Home health OT;Supervision/Assistance - 24 hour    Equipment Recommendations  Tub/shower seat           Precautions / Restrictions Precautions Precautions: Fall Restrictions Weight Bearing Restrictions: No      Mobility Bed Mobility Overal bed mobility: Needs Assistance Bed Mobility: Supine to Sit;Sit to Supine     Supine to sit: Supervision;HOB elevated Sit to supine: Min assist   General bed mobility comments: requires light assist for LEs back onto  bed  Transfers Overall transfer level: Needs assistance Equipment used: Rolling walker (2 wheeled) Transfers: Sit to/from Stand Sit to Stand: Min guard;Min assist         General transfer comment: close guard for safety to rise to standing, minA for balance in standing     Balance Overall balance assessment: Needs assistance Sitting-balance support: Feet supported Sitting balance-Leahy Scale: Good     Standing balance support: Bilateral upper extremity supported Standing balance-Leahy Scale: Poor Standing balance comment: reliant on UE support                           ADL either performed or assessed with clinical judgement   ADL Overall ADL's : Needs assistance/impaired Eating/Feeding: Modified independent;Sitting   Grooming: Set up;Sitting   Upper Body Bathing: Set up;Supervision/ safety;Sitting   Lower Body Bathing: Minimal assistance;Sit to/from stand   Upper Body Dressing : Set up;Supervision/safety;Sitting   Lower Body Dressing: Minimal assistance;Sit to/from stand   Toilet Transfer: Minimal assistance;Ambulation;RW Toilet Transfer Details (indicate cue type and reason): simulated via transfer to/from EOB Toileting- Clothing Manipulation and Hygiene: Minimal assistance;Sit to/from stand       Functional mobility during ADLs: Minimal assistance;Rolling walker General ADL Comments: pt with generalized weakness, tolerated multiple laps in room using RW but required cues to self-monitor and to initiate seated rest break                         Pertinent Vitals/Pain Pain Assessment: No/denies pain     Hand Dominance     Extremity/Trunk  Assessment Upper Extremity Assessment Upper Extremity Assessment: Generalized weakness   Lower Extremity Assessment Lower Extremity Assessment: Defer to PT evaluation       Communication Communication Communication: Prefers language other than English   Cognition Arousal/Alertness:  Awake/alert Behavior During Therapy: WFL for tasks assessed/performed Overall Cognitive Status: Difficult to assess                                     General Comments       Exercises     Shoulder Instructions      Home Living Family/patient expects to be discharged to:: Private residence Living Arrangements: Children                                      Prior Functioning/Environment          Comments: unable to get home information, pt speaks dialect not on stratus ipad in room         OT Problem List: Decreased strength;Decreased activity tolerance;Impaired balance (sitting and/or standing);Obesity;Cardiopulmonary status limiting activity;Decreased knowledge of use of DME or AE      OT Treatment/Interventions: Self-care/ADL training;Therapeutic exercise;Energy conservation;DME and/or AE instruction;Therapeutic activities;Patient/family education;Balance training    OT Goals(Current goals can be found in the care plan section) Acute Rehab OT Goals Patient Stated Goal: grateful for working with therapist and walking during session OT Goal Formulation: With patient Time For Goal Achievement: 02/17/19 Potential to Achieve Goals: Good  OT Frequency: Min 2X/week   Barriers to D/C:            Co-evaluation              AM-PAC OT "6 Clicks" Daily Activity     Outcome Measure Help from another person eating meals?: None Help from another person taking care of personal grooming?: A Little Help from another person toileting, which includes using toliet, bedpan, or urinal?: A Little Help from another person bathing (including washing, rinsing, drying)?: A Little Help from another person to put on and taking off regular upper body clothing?: A Little Help from another person to put on and taking off regular lower body clothing?: A Little 6 Click Score: 19   End of Session Equipment Utilized During Treatment: Rolling walker Nurse  Communication: Mobility status  Activity Tolerance: Patient tolerated treatment well Patient left: in bed;with call bell/phone within reach;with bed alarm set  OT Visit Diagnosis: Muscle weakness (generalized) (M62.81);Unsteadiness on feet (R26.81)                Time: [id number] OT Time Calculation (min): 22 min Charges:  OT General Charges $OT Visit: 1 Visit OT Evaluation $OT Eval Moderate Complexity: 1 Mod  Lou Cal, OT [employer] Pager [phone] Office [phone]   Raymondo Band 02/03/2019, 5:10 PM

## 2019-02-03 NOTE — Progress Notes (Signed)
PROGRESS NOTE    Mandy [name]  HUT:[id number] DOB: 29-Jan-1940 DOA: 01/31/2019 PCP: [name], MD  Brief Narrative:   79 year old lady with prior h/o hypertension, DM type 2, SBO, hyperlipidemia admitted for confused and generalized weakness. She was recently diagnosed with COVID 19 on 01/20/19.  On arrival to ED she was hypoxic requiring about 2 L of nasal cannula oxygen to keep sats greater than 90% CT of the head without contrast did not show any acute signs of stroke.  Labs were significant for sodium of 113.  Chest x-ray showed cardiomegaly with some vascular congestion.. We were able to wean her off oxygen.  She reports breathing is better coughing is better. No fever or chills.   Assessment & Plan:   Principal Problem:   Hyponatremia Active Problems:   Diabetes mellitus type 2 in obese (HCC)   Dyslipidemia   Essential hypertension   Prolonged QT interval   Acute metabolic encephalopathy   COVID-19 virus infection   Hyponatremia Differential include SIADH, urine sodium is pending.  Pt appears euvolemic.  Serum osmo is 245, urine osmo is 293, urine sodium is pending.  Sodium improved from 113 to 130 to 129 to 127.      Abnormal TSH,  Elevated free t4 levels. US thyroid ordered .thyrotropin receptor antibodies pending.    COVID-19 viral infection Patient initially required up to 2 L of nasal cannula oxygen but we were able to wean her off the oxygen today. Continue the course of remdesivir and IV Decadron. Day 4 /5 of Remdisivir and Day 4/10 of Decadron.  Follow inflammatory markers, CRP on admission was 0.8 and normal. Lactic acid wnl. nrmal pro calcitonin.  Continue with bronchodilators, incentive spirometry and flutter valve. PT eval recommending home health PT.    Type 2 diabetes mellitus with neuropathy Last hemoglobin A1c is 6.7 Well-controlled with diet and Metformin. CBG (last 3)  Recent Labs    02/03/19 0726 02/03/19 1150 02/03/19 1544  GLUCAP  102* 137* 226*   Continue with sliding scale insulin. No changes in meds.    Prolonged QT interval Repeat EKG shows improvement in QTC. Keep potassium greater than 4 and magnesium greater than 2.   Essential hypertension WELL CONTROLLED.  Continue with Norvasc 10 mg, atenolol 50 mg twice daily.   Throat pain during swallowing Resolved.      Abnormal EKG with ST and T wave changes. Patient denies any chest pain or shortness of breath. Echocardiogram ordered and is pending.  Repeat EKG shows NSR.     Acute metabolic encephalopathy Resolved PT IS ALERT and oriented.    Anemia of chronic disease:  Iron levels adequate.  Hemoglobin stable around 10.    DVT prophylaxis: Lovenox Code Status: Full code Family Communication: none at bedside, called daughter on 1/30 and 1/31 couldn't leave a message.  Disposition Plan:  . Patient came from: Home           . [facility]tic[facility] d[facility]c place[facility] . Barriers to d/c OR conditions which need to be met to effect a safe d/c: discharge home tomorrow.    Consultants:   None  Procedures: None Antimicrobials: None Subjective: Pt appears comfortable. No chest pain or sob, no nausea, or vomiting or abdominal pain.   Objective: Vitals:   02/03/19 0334 02/03/19 0728 02/03/19 1157 02/03/19 1200  BP: 135/60 (!) 146/64 137/65 (!) 146/63  Pulse: 63 63 63 70  Resp: 18     Temp: 98 F (36.7 C)  98.4 F (36.9 C) 98 F (36.7 C)   TempSrc: Oral Oral Oral   SpO2: 96% 93% 97% 96%  Weight:      Height:        Intake/Output Summary (Last 24 hours) at 02/03/2019 1547 Last data filed at 02/03/2019 0622 Gross per 24 hour  Intake 200 ml  Output 1775 ml  Net -1575 ml   Filed Weights   01/31/19 0234 02/01/19 0454 02/02/19 0500  Weight: 74.8 kg 73 kg 73.2 kg    Examination:  General exam: Alert and comfortable on room air Respiratory system: Air entry fair bilateral no wheezing or rhonchi. Cardiovascular system: S1-S2 heard,  regular rate rhythm, no JVD, no pedal edema Gastrointestinal system: Abdomen is soft, nontender, nondistended, bowel sounds normal Central nervous system: Alert and oriented, grossly nonfocal Extremities: No pedal edema or cyanosis Skin: No rashes or lesions Psychiatry: Mood is appropriate   Data Reviewed: I have personally reviewed following labs and imaging studies  CBC: Recent Labs  Lab 01/31/19 0429 01/31/19 0434 02/01/19 0306 02/03/19 0456  WBC 5.4  --  4.2 6.3  NEUTROABS 3.5  --  3.2  --   HGB 12.4 13.9 11.1* 10.6*  HCT 35.2* 41.0 32.2* 31.2*  MCV 75.9*  --  76.7* 79.4*  PLT 513*  --  501* 191*   Basic Metabolic Panel: Recent Labs  Lab 02/01/19 0306 02/01/19 0306 02/01/19 1101 02/01/19 1946 02/02/19 0521 02/02/19 1134 02/03/19 0456  NA 125*   < > 126* 122* 130* 129* 127*  K 3.7   < > 3.9 3.8 3.9 3.7 4.0  CL 88*   < > 89* 88* 95* 94* 97*  CO2 25   < > '24 22 25 24 24  ' GLUCOSE 120*   < > 128* 169* 119* 214* 123*  BUN 10   < > '11 19 15 16 17  ' CREATININE 0.92   < > 0.97 1.12* 1.07* 1.24* 1.07*  CALCIUM 8.7*   < > 9.0 8.8* 8.8* 8.4* 8.2*  MG 1.8  --   --   --   --   --   --   PHOS 3.4  --   --   --   --   --   --    < > = values in this interval not displayed.   GFR: Estimated Creatinine Clearance: 38.4 mL/min (A) (by C-G formula based on SCr of 1.07 mg/dL (H)). Liver Function Tests: Recent Labs  Lab 01/31/19 0429  AST 30  ALT 18  ALKPHOS 44  BILITOT 0.5  PROT 6.8  ALBUMIN 3.3*   No results for input(s): LIPASE, AMYLASE in the last 168 hours. No results for input(s): AMMONIA in the last 168 hours. Coagulation Profile: No results for input(s): INR, PROTIME in the last 168 hours. Cardiac Enzymes: No results for input(s): CKTOTAL, CKMB, CKMBINDEX, TROPONINI in the last 168 hours. BNP (last 3 results) No results for input(s): PROBNP in the last 8760 hours. HbA1C: No results for input(s): HGBA1C in the last 72 hours. CBG: Recent Labs  Lab  02/02/19 1700 02/02/19 2058 02/03/19 0726 02/03/19 1150 02/03/19 1544  GLUCAP 199* 161* 102* 137* 226*   Lipid Profile: No results for input(s): CHOL, HDL, LDLCALC, TRIG, CHOLHDL, LDLDIRECT in the last 72 hours. Thyroid Function Tests: Recent Labs    02/02/19 0521 02/02/19 1357  TSH 0.060*  --   FREET4  --  1.39*   Anemia Panel: Recent Labs    02/03/19 1220  TIBC 300  IRON 61   Sepsis Labs: Recent Labs  Lab 01/31/19 0429  PROCALCITON <0.10  LATICACIDVEN 1.1    Recent Results (from the past 240 hour(s))  Blood Culture (routine x 2)     Status: None (Preliminary result)   Collection Time: 01/31/19  4:15 AM   Specimen: BLOOD  Result Value Ref Range Status   Specimen Description BLOOD RIGHT HAND  Final   Special Requests   Final    BOTTLES DRAWN AEROBIC ONLY Blood Culture adequate volume   Culture   Final    NO GROWTH 3 DAYS Performed at [facility], [address], [place], [place] [address]    Report Status PENDING  Incomplete  Blood Culture (routine x 2)     Status: None (Preliminary result)   Collection Time: 01/31/19  4:24 AM   Specimen: BLOOD  Result Value Ref Range Status   Specimen Description BLOOD RIGHT ARM  Final   Special Requests   Final    BOTTLES DRAWN AEROBIC AND ANAEROBIC Blood Culture adequate volume   Culture   Final    NO GROWTH 3 DAYS Performed at [facility], [place] [address] [place], [name] [address]    Report Status PENDING  Incomplete  Respiratory Panel by RT PCR (Flu A&B, Covid) - Nasopharyngeal Swab     Status: Abnormal   Collection Time: 01/31/19  6:01 AM   Specimen: Nasopharyngeal Swab  Result Value Ref Range Status   SARS Coronavirus 2 by RT PCR POSITIVE (A) NEGATIVE Final    Comment: RESULT CALLED TO, READ BACK BY AND VERIFIED WITH: RN LESLIE S. [id number] FCP (NOTE) SARS-CoV-2 target nucleic acids are DETECTED. SARS-CoV-2 RNA is generally detectable in upper respiratory specimens  during the acute phase of  infection. Positive results are indicative of the presence of the identified virus, but do not rule out bacterial infection or co-infection with other pathogens not detected by the test. Clinical correlation with patient history and other diagnostic information is necessary to determine patient infection status. The expected result is Negative. Fact Sheet for Patients:  PinkCheek.be Fact Sheet for Healthcare Providers: GravelBags.it This test is not yet approved or cleared by the [place] FDA and  has been authorized for detection and/or diagnosis of SARS-CoV-2 by FDA under an Emergency Use Authorization (EUA).  This EUA will remain in effect (meaning this test can be used) for the  duration of  the COVID-19 declaration under Section 564(b)(1) of the Act, 21 U.S.C. section 360bbb-3(b)(1), unless the authorization is terminated or revoked sooner.    Influenza A by PCR NEGATIVE NEGATIVE Final   Influenza B by PCR NEGATIVE NEGATIVE Final    Comment: (NOTE) The Xpert Xpress SARS-CoV-2/FLU/RSV assay is intended as an aid in  the diagnosis of influenza from Nasopharyngeal swab specimens and  should not be used as a sole basis for treatment. Nasal washings and  aspirates are unacceptable for Xpert Xpress SARS-CoV-2/FLU/RSV  testing. Fact Sheet for Patients: PinkCheek.be Fact Sheet for Healthcare Providers: GravelBags.it This test is not yet approved or cleared by the [place] FDA and  has been authorized for detection and/or diagnosis of SARS-CoV-2 by  FDA under an Emergency Use Authorization (EUA). This EUA will remain  in effect (meaning this test can be used) for the duration of the  Covid-19 declaration under Section 564(b)(1) of the Act, 21  U.S.C. section 360bbb-3(b)(1), unless the authorization is  terminated or revoked. Performed at [facility],  [place] [address], [place],  [address]          Radiology Studies: ECHOCARDIOGRAM LIMITED  Result Date: 02/03/2019   ECHOCARDIOGRAM LIMITED REPORT   Patient Name:   DEVERY ODWYER Date of Exam: 02/03/2019 Medical Rec #:  [id number]      Height:       59.0 in Accession #:    [id number]     Weight:       161.4 lb Date of Birth:  08-21-1940       BSA:          1.68 m Patient Age:    38 years       BP:           146/64 mmHg Patient Gender: F              HR:           63 bpm. Exam Location:  Inpatient  Procedure: Limited Echo, Limited Color Doppler and Cardiac Doppler Indications:    abnormal ecg 794.31  History:        Patient has no prior history of Echocardiogram examinations.  Sonographer:    Johny Chess Referring Phys: Lewie Chamber Curstin Schmale IMPRESSIONS  1. Left ventricular ejection fraction, by visual estimation, is 50 to 55%.  2. Left ventricular diastolic parameters are consistent with Grade I diastolic dysfunction (impaired relaxation).  3. Global right ventricle has normal systolc function.The right ventricular size is normal. no increase in right ventricular wall thickness.  4. Small pericardial effusion.  5. The mitral valve is abnormal. Mild mitral valve regurgitation.  6. The tricuspid valve was normal in structure. Tricuspid valve regurgitation is mild.  7. Tricuspid valve regurgitation is mild.  8. Mild aortic valve sclerosis without stenosis.  9. The pulmonic valve was normal in structure. 10. Moderately elevated pulmonary artery systolic pressure. 11. The inferior vena cava is normal in size with greater than 50% respiratory variability, suggesting right atrial pressure of 3 mmHg. FINDINGS  Left Ventricle: Left ventricular ejection fraction, by visual estimation, is 50 to 55%. Left ventricular diastolic parameters are consistent with Grade I diastolic dysfunction (impaired relaxation). Right Ventricle: The right ventricular size is normal. No increase in right ventricular wall thickness.  Global RV systolic function is has normal systolic function. The tricuspid regurgitant velocity is 3.37 m/s, and with an assumed right atrial pressure  of 3 mmHg, the estimated right ventricular systolic pressure is moderately elevated at 48.4 mmHg. Left Atrium: Left atrial size was normal in size. Right Atrium: Right atrial size was normal in size. Right atrial pressure is estimated at 3 mmHg. Pericardium: A small pericardial effusion is present is seen. A small pericardial effusion is present. Mitral Valve: The mitral valve is abnormal. There is mild thickening of the mitral valve leaflet(s). MV Area by PHT, 2.73 cm. MV PHT, 80.62 msec. Mild mitral valve regurgitation. Tricuspid Valve: The tricuspid valve is normal in structure. Tricuspid valve regurgitation is mild. Aortic Valve: The aortic valve is tricuspid. Aortic valve regurgitation is trivial. Mild aortic valve sclerosis is present, with no evidence of aortic valve stenosis. Pulmonic Valve: The pulmonic valve was normal in structure. Pulmonic valve regurgitation is trivial by color flow Doppler. Pulmonic regurgitation is trivial by color flow Doppler. Aorta: The aortic root is normal in size and structure. Venous: The inferior vena cava is normal in size with greater than 50% respiratory variability, suggesting right atrial pressure of 3 mmHg. Shunts: No atrial level shunt detected  by color flow Doppler.  LEFT VENTRICLE          Normals PLAX 2D LVIDd:         4.20 cm  3.6 cm   Diastology                 Normals LVIDs:         3.10 cm  1.7 cm   LV e' lateral:   7.40 cm/s 6.42 cm/s LV PW:         1.00 cm  1.4 cm   LV E/e' lateral: 11.4      15.4 LV IVS:        1.10 cm  1.3 cm   LV e' medial:    7.94 cm/s 6.96 cm/s LVOT diam:     2.10 cm  2.0 cm   LV E/e' medial:  10.6      6.96 LV SV:         41 ml    79 ml LV SV Index:   22.86    45 ml/m2 LVOT Area:     3.46 cm 3.14 cm2  LEFT ATRIUM         Index LA diam:    3.50 cm 2.08 cm/m  AORTIC VALVE              Normals LVOT Vmax:   84.90 cm/s LVOT Vmean:  57.000 cm/s 75 cm/s LVOT VTI:    0.196 m     25.3 cm  AORTA                 Normals Ao Root diam: 3.10 cm 31 mm MITRAL VALVE              Normals   TRICUSPID VALVE             Normals MV Area (PHT): 2.73 cm             TR Peak grad:   45.4 mmHg MV PHT:        80.62 msec 55 ms     TR Vmax:        337.00 cm/s 288 cm/s MV Decel Time: 278 msec   187 ms MV E velocity: 84.00 cm/s 103 cm/s  SHUNTS MV A velocity: 96.00 cm/s 70.3 cm/s Systemic VTI:  0.20 m MV E/A ratio:  0.88       1.5       Systemic Diam: 2.10 cm  Dorris Carnes MD Electronically signed by Dorris Carnes MD Signature Date/Time: 02/03/2019/11:38:00 AMThe mitral valve is abnormal.    Final         Scheduled Meds: . amLODipine  10 mg Oral Daily  . vitamin C  500 mg Oral Daily  . atenolol  50 mg Oral BID  . dexamethasone  6 mg Oral Daily  . enoxaparin (LOVENOX) injection  40 mg Subcutaneous Daily  . insulin aspart  0-9 Units Subcutaneous TID WC  . pantoprazole  40 mg Oral Q0600  . sodium chloride flush  3 mL Intravenous Q12H  . sucralfate  1 g Oral TID WC & HS  . zinc sulfate  220 mg Oral Daily   Continuous Infusions: . sodium chloride 10 mL/hr at 01/31/19 1728  . remdesivir 100 mg in NS 100 mL 100 mg (02/03/19 0852)     LOS: 3 days        Hosie Poisson, MD [facility]   To contact the attending provider between 7A-7P or the covering  provider during after hours 7P-7A, please log into the web site [email or web address] and access using universal [place] password for that web site. If you do not have the password, please call the hospital operator.  02/03/2019, 3:47 PM

## 2019-02-03 NOTE — Progress Notes (Signed)
  Echocardiogram 2D Echocardiogram has been performed.  Mandy [name] 02/03/2019, 10:39 AM

## 2019-02-03 NOTE — Care Management (Signed)
Spoke w daughter over the phone on the number listed for the home. She states that her mom will return home to address on facesheet. She would like [facility] through [facility] [place]. Referral accepted by [facility].

## 2019-02-04 LAB — BASIC METABOLIC PANEL
Anion gap: 11 (ref 5–15)
BUN: 18 mg/dL (ref 8–23)
CO2: 28 mmol/L (ref 22–32)
Calcium: 9.2 mg/dL (ref 8.9–10.3)
Chloride: 94 mmol/L — ABNORMAL LOW (ref 98–111)
Creatinine, Ser: 1.05 mg/dL — ABNORMAL HIGH (ref 0.44–1.00)
GFR calc Af Amer: 59 mL/min — ABNORMAL LOW (ref >60)
GFR calc non Af Amer: 51 mL/min — ABNORMAL LOW (ref >60)
Glucose, Bld: 129 mg/dL — ABNORMAL HIGH (ref 70–99)
Potassium: 3.5 mmol/L (ref 3.5–5.1)
Sodium: 133 mmol/L — ABNORMAL LOW (ref 135–145)

## 2019-02-04 LAB — GLUCOSE, CAPILLARY
Glucose-Capillary: 99 mg/dL (ref 70–99)
Glucose-Capillary: 145 mg/dL — ABNORMAL HIGH (ref 70–99)
Glucose-Capillary: 194 mg/dL — ABNORMAL HIGH (ref 70–99)

## 2019-02-04 LAB — SODIUM, URINE, RANDOM: Sodium, Ur: 91 mmol/L

## 2019-02-04 LAB — THYROTROPIN RECEPTOR AUTOABS: Thyrotropin Receptor Ab: <1.1 IU/L (ref 0.00–1.75)

## 2019-02-04 MED ORDER — ALBUTEROL SULFATE HFA 108 (90 BASE) MCG/ACT IN AERS: 2.0000 | INHALATION_SPRAY | Freq: Four times a day (QID) | RESPIRATORY_TRACT | 0 refills | Status: DC | PRN

## 2019-02-04 MED ORDER — ZINC SULFATE 220 (50 ZN) MG PO CAPS: 220.0000 mg | ORAL_CAPSULE | Freq: Every day | ORAL | Status: DC

## 2019-02-04 MED ORDER — METHIMAZOLE 5 MG PO TABS
2.5000 mg | ORAL_TABLET | Freq: Every day | ORAL | Status: DC
  Administered 2019-02-04: 2.5 mg via ORAL
  Filled 2019-02-04: qty 1

## 2019-02-04 MED ORDER — GUAIFENESIN-DM 100-10 MG/5ML PO SYRP: 10.0000 mL | ORAL_SOLUTION | ORAL | 0 refills | Status: DC | PRN

## 2019-02-04 MED ORDER — DEXAMETHASONE 6 MG PO TABS: 6.0000 mg | ORAL_TABLET | Freq: Every day | ORAL | 0 refills | Status: AC

## 2019-02-04 MED ORDER — PANTOPRAZOLE SODIUM 40 MG PO TBEC: 40.0000 mg | DELAYED_RELEASE_TABLET | Freq: Every day | ORAL | 0 refills | Status: DC

## 2019-02-04 MED ORDER — FUROSEMIDE 20 MG PO TABS: 20.0000 mg | ORAL_TABLET | Freq: Every day | ORAL | 1 refills | Status: DC

## 2019-02-04 MED ORDER — METHIMAZOLE 5 MG PO TABS: 2.5000 mg | ORAL_TABLET | Freq: Every day | ORAL | 0 refills | Status: AC

## 2019-02-04 NOTE — Progress Notes (Signed)
[place] with Daughter [name] she reports she was on her way to come pick up her mother. She had to stop buy to pick up prescription medications at [facility] and then coming to the hospital.  1744 Attempted to call daughter to ask for estimated time of arrival no answer.

## 2019-02-04 NOTE — Progress Notes (Signed)
Daughter returned phone call, stated she is on the way. RN instructed daughter to park outside of ED.

## 2019-02-04 NOTE — Progress Notes (Signed)
Attempted to call daughter [name] no answer on phone.

## 2019-02-04 NOTE — Progress Notes (Signed)
Physical Therapy Treatment Patient Details Name: Mandy [name] MRN: [id number] DOB: 18-Aug-1940 Today's Date: 02/04/2019    History of Present Illness 79 year old female with prior h/o hypertension, DM type 2, SBO, hyperlipidemia admitted for confusion and generalized weakness. She was recently diagnosed with COVID 19 on 01/20/19.  On arrival to ED she was hypoxic requiring about 2 L of nasal cannula oxygen to keep sats greater than 90% CT of the head without contrast did not show any acute signs of stroke.    PT Comments    Patient progressing with endurance and stability with ambulation.  Tolerated 7-8 laps in room without c/o or SOB noted.  Feel stable for home when medically ready.  HHPT remains appropriate as well.    Follow Up Recommendations  Home health PT     Equipment Recommendations  Rolling walker with 5" wheels(youth RW if she doesn't have one)    Recommendations for Other Services       Precautions / Restrictions Precautions Precautions: Fall    Mobility  Bed Mobility         Supine to sit: Modified independent (Device/Increase time) Sit to supine: Supervision   General bed mobility comments: increased time and assist for safety to supine  Transfers Overall transfer level: Needs assistance Equipment used: Rolling walker (2 wheeled) Transfers: Sit to/from Stand Sit to Stand: Min guard         General transfer comment: minguard for balance  Ambulation/Gait Ambulation/Gait assistance: Min guard;Supervision Gait Distance (Feet): 200 Feet(7-8 laps back and forth to door in room) Assistive device: Rolling walker (2 wheeled) Gait Pattern/deviations: Step-through pattern;Decreased stride length     General Gait Details: at times trunk flexed, keeps walker in front, moves safely to turn in small space with RW; on RA, SpO2 WNL   Stairs             Wheelchair Mobility    Modified Rankin (Stroke Patients Only)       Balance Overall balance  assessment: Needs assistance Sitting-balance support: Feet supported Sitting balance-Leahy Scale: Good       Standing balance-Leahy Scale: Fair                              Cognition Arousal/Alertness: Awake/alert Behavior During Therapy: WFL for tasks assessed/performed Overall Cognitive Status: Difficult to assess                                 General Comments: seems WFL for mobility      Exercises Other Exercises Other Exercises: standing marching, squats and heel raises with UE support 5-10 reps    General Comments General comments (skin integrity, edema, etc.): No c/o with ambulation; attempted touse stratus audio interpreter, but representative reported no [place] interpreter available.      Pertinent Vitals/Pain Pain Assessment: No/denies pain    Home Living                      Prior Function            PT Goals (current goals can now be found in the care plan section) Progress towards PT goals: Progressing toward goals    Frequency    Min 3X/week      PT Plan Current plan remains appropriate    Co-evaluation  AM-PAC PT "6 Clicks" Mobility   Outcome Measure  Help needed turning from your back to your side while in a flat bed without using bedrails?: None Help needed moving from lying on your back to sitting on the side of a flat bed without using bedrails?: A Little Help needed moving to and from a bed to a chair (including a wheelchair)?: A Little Help needed standing up from a chair using your arms (e.g., wheelchair or bedside chair)?: A Little Help needed to walk in hospital room?: A Little Help needed climbing 3-5 steps with a railing? : A Little 6 Click Score: 19    End of Session   Activity Tolerance: Patient tolerated treatment well Patient left: in bed;with call bell/phone within reach;with bed alarm set;with nursing/sitter in room   PT Visit Diagnosis: Muscle weakness (generalized)  (M62.81);Other abnormalities of gait and mobility (R26.89);Other symptoms and signs involving the nervous system (R29.898)     Time: [id number] PT Time Calculation (min) (ACUTE ONLY): 19 min  Charges:  $Gait Training: 8-22 mins                     [place] [phone] 02/04/2019    Reginia Naas 02/04/2019, 1:40 PM

## 2019-02-04 NOTE — Progress Notes (Signed)
Attempted to call daughter to give update about patient's status no answer. Will attempt again at a later time.

## 2019-02-04 NOTE — Progress Notes (Signed)
Attempted to reach out to daughter again to notify about discharge could not reach daughter. Will attempt again at a later time.

## 2019-02-04 NOTE — Progress Notes (Signed)
Spoke with Daughter [name] and was able to go over discharge instructions over the phone with her. Patient is aware that I am speaking to her daughter regarding care plan and discharge. Patient's daughter made RN aware that she is feeling ill and will be picking up her mother at a later time.

## 2019-02-04 NOTE — Discharge Summary (Addendum)
Physician Discharge Summary  Charlisse Hoberg [id number] DOB: [birth year] DOA: 01/31/2019  PCP: [name], MD  Admit date: 01/31/2019 Discharge date: 02/04/2019  Admitted From: Home.  Disposition: Home.  Recommendations for Outpatient Follow-up:  1. Follow up with PCP in 1-2 weeks 2. Please obtain BMP/CBC in one week 3. Please follow up with endocrinology in one week.  4. Please obtain US thyroid as outpatient.    Home Health:yes   Discharge Condition:stable.  CODE STATUS:full code.  Diet recommendation: Heart Healthy    Brief/Interim Summary: 79 year old lady with prior h/o hypertension, DM type 2, SBO, hyperlipidemia admitted for confused and generalized weakness. She was recently diagnosed with COVID 19 on 01/20/19.  On arrival to ED she was hypoxic requiring about 2 L of nasal cannula oxygen to keep sats greater than 90% CT of the head without contrast did not show any acute signs of stroke.  Labs were significant for sodium of 113.  Chest x-ray showed cardiomegaly with some vascular congestion.. We were able to wean her off oxygen.  She reports breathing is better coughing is better.  Discharge Diagnoses:  Principal Problem:   Hyponatremia Active Problems:   Diabetes mellitus type 2 in obese (HCC)   Dyslipidemia   Essential hypertension   Prolonged QT interval   Acute metabolic encephalopathy   COVID-19 virus infection   Hyponatremia Differential include SIADH, urine sodium is pending.  Pt appears euvolemic.  Serum osmo is 245, urine osmo is 293, urine sodium is pending.  Sodium improved from 113 to 133 on discharge.      Abnormal TSH,  Elevated free t4 levels. US thyroid ordered .thyrotropin receptor antibodies negative, ordered low dose methimazole. Will need US thyroid as outpatient.    COVID-19 viral infection Patient initially required up to 2 L of nasal cannula oxygen but we were able to wean her off the oxygen today. Completed  Remdisivir and plan  for discharge on  Decadron.  Follow inflammatory markers, CRP on admission was 0.8 and normal. Lactic acid wnl. nrmal pro calcitonin.  Continue with bronchodilators, incentive spirometry and flutter valve. PT eval recommending home health PT.    Type 2 diabetes mellitus with neuropathy Last hemoglobin A1c is 6.7 Well-controlled with diet and Metformin. CBG (last 3)  Recent Labs (last 2 labs)        Recent Labs    02/03/19 0726 02/03/19 1150 02/03/19 1544  GLUCAP 102* 137* 226*     Continue with sliding scale insulin. No changes in meds.    Prolonged QT interval Repeat EKG shows improvement in QTC. Keep potassium greater than 4 and magnesium greater than 2.   Essential hypertension WELL CONTROLLED.  Continue with Norvasc 10 mg, atenolol 50 mg twice daily.   Throat pain during swallowing Resolved.      Abnormal EKG with ST and T wave changes. Patient denies any chest pain or shortness of breath. Echocardiogram ordered and reviewed, no wall motion abnormalities noted.  Repeat EKG shows NSR.     Acute metabolic encephalopathy Resolved Pt is alert  and oriented.    Anemia of chronic disease:  Iron levels adequate.  Hemoglobin stable around 10.     Discharge Instructions  Discharge Instructions    Diet - low sodium heart healthy   Complete by: As directed    Discharge instructions   Complete by: As directed    Please follow up with PCP in one week     Allergies as of 02/04/2019   No  Known Allergies     Medication List    STOP taking these medications   meloxicam 7.5 MG tablet Commonly known as: MOBIC     TAKE these medications   albuterol 108 (90 Base) MCG/ACT inhaler Commonly known as: VENTOLIN HFA Inhale 2 puffs into the lungs every 6 (six) hours as needed for wheezing or shortness of breath.   alendronate 70 MG tablet Commonly known as: FOSAMAX Take 70 mg by mouth once a week. Take with a full glass of water on an  empty stomach.   amLODipine 10 MG tablet Commonly known as: NORVASC Take 10 mg by mouth daily. Notes to patient: 02/05/2019 AM   atenolol 50 MG tablet Commonly known as: TENORMIN Take 50 mg by mouth 2 (two) times daily. Notes to patient: 02/04/2019 PM   benazepril-hydrochlorthiazide 20-25 MG tablet Commonly known as: LOTENSIN HCT Take 1 tablet by mouth daily.   calcium carbonate 1500 (600 Ca) MG Tabs tablet Commonly known as: OSCAL Take 600 mg of elemental calcium by mouth 2 (two) times daily with a meal.   dexamethasone 6 MG tablet Commonly known as: DECADRON Take 1 tablet (6 mg total) by mouth daily for 6 days. Start taking on: February 05, 2019   diclofenac sodium 1 % Gel Commonly known as: VOLTAREN Apply 4 g topically 4 (four) times daily as needed for pain.   furosemide 20 MG tablet Commonly known as: Lasix Take 1 tablet (20 mg total) by mouth daily. Notes to patient: 02/05/2019 AM   gabapentin 100 MG capsule Commonly known as: NEURONTIN Take 100 mg by mouth at bedtime as needed (sleep).   guaiFENesin-dextromethorphan 100-10 MG/5ML syrup Commonly known as: ROBITUSSIN DM Take 10 mLs by mouth every 4 (four) hours as needed for cough.   metFORMIN 500 MG tablet Commonly known as: GLUCOPHAGE Take 500 mg by mouth 2 (two) times daily with a meal.   methimazole 5 MG tablet Commonly known as: TAPAZOLE Take 0.5 tablets (2.5 mg total) by mouth daily. Notes to patient: 02/05/2019   pantoprazole 40 MG tablet Commonly known as: PROTONIX Take 1 tablet (40 mg total) by mouth daily at 6 (six) AM. Start taking on: February 05, 2019   potassium chloride 10 MEQ tablet Commonly known as: KLOR-CON Take 10 mEq by mouth daily.   simvastatin 10 MG tablet Commonly known as: ZOCOR Take 10 mg by mouth at bedtime.   tiZANidine 4 MG tablet Commonly known as: ZANAFLEX Take 4 mg by mouth 2 (two) times daily.   vitamin C 500 MG tablet Commonly known as: ASCORBIC ACID Take 500 mg by  mouth daily.   zinc sulfate 220 (50 Zn) MG capsule Take 1 capsule (220 mg total) by mouth daily. Start taking on: February 05, 2019       No Known Allergies  Consultations:  None.    Procedures/Studies: CT Head Wo Contrast  Result Date: 01/31/2019 CLINICAL DATA:  Delirium. Additional history provided by technologist: Patient diagnosed with COVID 14 days ago, more confused since that time EXAM: CT HEAD WITHOUT CONTRAST TECHNIQUE: Contiguous axial images were obtained from the base of the skull through the vertex without intravenous contrast. COMPARISON:  Brain MRI 06/02/2005 FINDINGS: Brain: No evidence of acute intracranial hemorrhage. No demarcated cortical infarction. No evidence of intracranial mass. No midline shift or extra-axial fluid collection. Patchy hypoattenuation within the cerebral white matter is nonspecific, but most commonly seen on the basis of chronic small vessel ischemic disease. These white matter changes have progressed since  prior MRI 06/02/2005. Mild generalized parenchymal atrophy Vascular: No hyperdense vessel.  Atherosclerotic calcifications. Skull: Normal. Negative for fracture or focal lesion. Sinuses/Orbits: Visualized orbits demonstrate no acute abnormality. Mild ethmoid sinus mucosal thickening. No significant mastoid effusion. IMPRESSION: No evidence of acute intracranial hemorrhage or acute demarcated cortical infarction. Patchy hypoattenuation within the cerebral white matter is nonspecific, but most commonly seen on the basis of chronic small vessel ischemic disease. These white matter changes have progressed since MRI 06/02/2005. Mild generalized parenchymal atrophy. Electronically Signed   By: Kellie Simmering DO   On: 01/31/2019 07:10   DG Chest Port 1 View  Result Date: 01/31/2019 CLINICAL DATA:  Shortness of breath EXAM: PORTABLE CHEST 1 VIEW COMPARISON:  08/09/2018 FINDINGS: Cardiomegaly, vascular congestion. No overt edema. Bibasilar atelectasis. No  visible effusions. No acute bony abnormality. IMPRESSION: Cardiomegaly.  Vascular congestion.  Bibasilar atelectasis. Electronically Signed   By: Rolm Baptise M.D.   On: 01/31/2019 03:32   ECHOCARDIOGRAM LIMITED  Result Date: 02/03/2019   ECHOCARDIOGRAM LIMITED REPORT   Patient Name:   ROSANGELA GARRETTE Date of Exam: 02/03/2019 Medical Rec #:  [id number]      Height:       59.0 in Accession #:    [id number]     Weight:       161.4 lb Date of Birth:  1940-08-09       BSA:          1.68 m Patient Age:    52 years       BP:           146/64 mmHg Patient Gender: F              HR:           63 bpm. Exam Location:  Inpatient  Procedure: Limited Echo, Limited Color Doppler and Cardiac Doppler Indications:    abnormal ecg 794.31  History:        Patient has no prior history of Echocardiogram examinations.  Sonographer:    Johny Chess Referring Phys: Lewie Chamber Filomeno Cromley IMPRESSIONS  1. Left ventricular ejection fraction, by visual estimation, is 50 to 55%.  2. Left ventricular diastolic parameters are consistent with Grade I diastolic dysfunction (impaired relaxation).  3. Global right ventricle has normal systolc function.The right ventricular size is normal. no increase in right ventricular wall thickness.  4. Small pericardial effusion.  5. The mitral valve is abnormal. Mild mitral valve regurgitation.  6. The tricuspid valve was normal in structure. Tricuspid valve regurgitation is mild.  7. Tricuspid valve regurgitation is mild.  8. Mild aortic valve sclerosis without stenosis.  9. The pulmonic valve was normal in structure. 10. Moderately elevated pulmonary artery systolic pressure. 11. The inferior vena cava is normal in size with greater than 50% respiratory variability, suggesting right atrial pressure of 3 mmHg. FINDINGS  Left Ventricle: Left ventricular ejection fraction, by visual estimation, is 50 to 55%. Left ventricular diastolic parameters are consistent with Grade I diastolic dysfunction (impaired  relaxation). Right Ventricle: The right ventricular size is normal. No increase in right ventricular wall thickness. Global RV systolic function is has normal systolic function. The tricuspid regurgitant velocity is 3.37 m/s, and with an assumed right atrial pressure  of 3 mmHg, the estimated right ventricular systolic pressure is moderately elevated at 48.4 mmHg. Left Atrium: Left atrial size was normal in size. Right Atrium: Right atrial size was normal in size. Right atrial pressure is estimated at 3 mmHg. Pericardium: A small pericardial  effusion is present is seen. A small pericardial effusion is present. Mitral Valve: The mitral valve is abnormal. There is mild thickening of the mitral valve leaflet(s). MV Area by PHT, 2.73 cm. MV PHT, 80.62 msec. Mild mitral valve regurgitation. Tricuspid Valve: The tricuspid valve is normal in structure. Tricuspid valve regurgitation is mild. Aortic Valve: The aortic valve is tricuspid. Aortic valve regurgitation is trivial. Mild aortic valve sclerosis is present, with no evidence of aortic valve stenosis. Pulmonic Valve: The pulmonic valve was normal in structure. Pulmonic valve regurgitation is trivial by color flow Doppler. Pulmonic regurgitation is trivial by color flow Doppler. Aorta: The aortic root is normal in size and structure. Venous: The inferior vena cava is normal in size with greater than 50% respiratory variability, suggesting right atrial pressure of 3 mmHg. Shunts: No atrial level shunt detected by color flow Doppler.  LEFT VENTRICLE          Normals PLAX 2D LVIDd:         4.20 cm  3.6 cm   Diastology                 Normals LVIDs:         3.10 cm  1.7 cm   LV e' lateral:   7.40 cm/s 6.42 cm/s LV PW:         1.00 cm  1.4 cm   LV E/e' lateral: 11.4      15.4 LV IVS:        1.10 cm  1.3 cm   LV e' medial:    7.94 cm/s 6.96 cm/s LVOT diam:     2.10 cm  2.0 cm   LV E/e' medial:  10.6      6.96 LV SV:         41 ml    79 ml LV SV Index:   22.86    45 ml/m2 LVOT  Area:     3.46 cm 3.14 cm2  LEFT ATRIUM         Index LA diam:    3.50 cm 2.08 cm/m  AORTIC VALVE             Normals LVOT Vmax:   84.90 cm/s LVOT Vmean:  57.000 cm/s 75 cm/s LVOT VTI:    0.196 m     25.3 cm  AORTA                 Normals Ao Root diam: 3.10 cm 31 mm MITRAL VALVE              Normals   TRICUSPID VALVE             Normals MV Area (PHT): 2.73 cm             TR Peak grad:   45.4 mmHg MV PHT:        80.62 msec 55 ms     TR Vmax:        337.00 cm/s 288 cm/s MV Decel Time: 278 msec   187 ms MV E velocity: 84.00 cm/s 103 cm/s  SHUNTS MV A velocity: 96.00 cm/s 70.3 cm/s Systemic VTI:  0.20 m MV E/A ratio:  0.88       1.5       Systemic Diam: 2.10 cm  Dorris Carnes MD Electronically signed by Dorris Carnes MD Signature Date/Time: 02/03/2019/11:38:00 AMThe mitral valve is abnormal.    Final     echcoardiogram    Subjective: No new complaints.  Discharge Exam: Vitals:   02/04/19 1123 02/04/19 1647  BP: (!) 143/63 (!) 128/58  Pulse: 68 64  Resp: 18 (!) 22  Temp: 98.7 F (37.1 C) 98.1 F (36.7 C)  SpO2: 95% 100%   Vitals:   02/04/19 0500 02/04/19 0733 02/04/19 1123 02/04/19 1647  BP:  (!) 145/68 (!) 143/63 (!) 128/58  Pulse:  65 68 64  Resp:  17 18 (!) 22  Temp:  98.6 F (37 C) 98.7 F (37.1 C) 98.1 F (36.7 C)  TempSrc:  Oral Oral Oral  SpO2:  100% 95% 100%  Weight: 74 kg     Height:        General: Pt is alert, awake, not in acute distress Cardiovascular: RRR, S1/S2 +, no rubs, no gallops Respiratory: CTA bilaterally, no wheezing, no rhonchi Abdominal: Soft, NT, ND, bowel sounds + Extremities: no edema, no cyanosis    The results of significant diagnostics from this hospitalization (including imaging, microbiology, ancillary and laboratory) are listed below for reference.     Microbiology: Recent Results (from the past 240 hour(s))  Blood Culture (routine x 2)     Status: None (Preliminary result)   Collection Time: 01/31/19  4:15 AM   Specimen: BLOOD  Result  Value Ref Range Status   Specimen Description BLOOD RIGHT HAND  Final   Special Requests   Final    BOTTLES DRAWN AEROBIC ONLY Blood Culture adequate volume   Culture   Final    NO GROWTH 4 DAYS Performed at [facility], [address] [place], [place] [address]    Report Status PENDING  Incomplete  Blood Culture (routine x 2)     Status: None (Preliminary result)   Collection Time: 01/31/19  4:24 AM   Specimen: BLOOD  Result Value Ref Range Status   Specimen Description BLOOD RIGHT ARM  Final   Special Requests   Final    BOTTLES DRAWN AEROBIC AND ANAEROBIC Blood Culture adequate volume   Culture   Final    NO GROWTH 4 DAYS Performed at [facility], [place] [address], [place], [place] [address]    Report Status PENDING  Incomplete  Respiratory Panel by RT PCR (Flu A&B, Covid) - Nasopharyngeal Swab     Status: Abnormal   Collection Time: 01/31/19  6:01 AM   Specimen: Nasopharyngeal Swab  Result Value Ref Range Status   SARS Coronavirus 2 by RT PCR POSITIVE (A) NEGATIVE Final    Comment: RESULT CALLED TO, READ BACK BY AND VERIFIED WITH: RN LESLIE S. [id number] FCP (NOTE) SARS-CoV-2 target nucleic acids are DETECTED. SARS-CoV-2 RNA is generally detectable in upper respiratory specimens  during the acute phase of infection. Positive results are indicative of the presence of the identified virus, but do not rule out bacterial infection or co-infection with other pathogens not detected by the test. Clinical correlation with patient history and other diagnostic information is necessary to determine patient infection status. The expected result is Negative. Fact Sheet for Patients:  PinkCheek.be Fact Sheet for Healthcare Providers: GravelBags.it This test is not yet approved or cleared by the [place] FDA and  has been authorized for detection and/or diagnosis of SARS-CoV-2 by FDA under an Emergency Use  Authorization (EUA).  This EUA will remain in effect (meaning this test can be used) for the  duration of  the COVID-19 declaration under Section 564(b)(1) of the Act, 21 U.S.C. section 360bbb-3(b)(1), unless the authorization is terminated or revoked sooner.  Influenza A by PCR NEGATIVE NEGATIVE Final   Influenza B by PCR NEGATIVE NEGATIVE Final    Comment: (NOTE) The Xpert Xpress SARS-CoV-2/FLU/RSV assay is intended as an aid in  the diagnosis of influenza from Nasopharyngeal swab specimens and  should not be used as a sole basis for treatment. Nasal washings and  aspirates are unacceptable for Xpert Xpress SARS-CoV-2/FLU/RSV  testing. Fact Sheet for Patients: PinkCheek.be Fact Sheet for Healthcare Providers: GravelBags.it This test is not yet approved or cleared by the [place] FDA and  has been authorized for detection and/or diagnosis of SARS-CoV-2 by  FDA under an Emergency Use Authorization (EUA). This EUA will remain  in effect (meaning this test can be used) for the duration of the  Covid-19 declaration under Section 564(b)(1) of the Act, 21  U.S.C. section 360bbb-3(b)(1), unless the authorization is  terminated or revoked. Performed at [facility], [place] [address], [place], [place] [address]      Labs: BNP (last 3 results) Recent Labs    01/31/19 0429  BNP 123XX123*   Basic Metabolic Panel: Recent Labs  Lab 02/01/19 0306 02/01/19 1101 02/01/19 1946 02/02/19 0521 02/02/19 1134 02/03/19 0456 02/04/19 1023  NA 125*   < > 122* 130* 129* 127* 133*  K 3.7   < > 3.8 3.9 3.7 4.0 3.5  CL 88*   < > 88* 95* 94* 97* 94*  CO2 25   < > 22 25 24 24 28   GLUCOSE 120*   < > 169* 119* 214* 123* 129*  BUN 10   < > 19 15 16 17 18   CREATININE 0.92   < > 1.12* 1.07* 1.24* 1.07* 1.05*  CALCIUM 8.7*   < > 8.8* 8.8* 8.4* 8.2* 9.2  MG 1.8  --   --   --   --   --   --   PHOS 3.4  --   --   --   --   --   --    <  > = values in this interval not displayed.   Liver Function Tests: Recent Labs  Lab 01/31/19 0429  AST 30  ALT 18  ALKPHOS 44  BILITOT 0.5  PROT 6.8  ALBUMIN 3.3*   No results for input(s): LIPASE, AMYLASE in the last 168 hours. No results for input(s): AMMONIA in the last 168 hours. CBC: Recent Labs  Lab 01/31/19 0429 01/31/19 0434 02/01/19 0306 02/03/19 0456  WBC 5.4  --  4.2 6.3  NEUTROABS 3.5  --  3.2  --   HGB 12.4 13.9 11.1* 10.6*  HCT 35.2* 41.0 32.2* 31.2*  MCV 75.9*  --  76.7* 79.4*  PLT 513*  --  501* 467*   Cardiac Enzymes: No results for input(s): CKTOTAL, CKMB, CKMBINDEX, TROPONINI in the last 168 hours. BNP: Invalid input(s): POCBNP CBG: Recent Labs  Lab 02/03/19 2010 02/03/19 2351 02/04/19 0732 02/04/19 1121 02/04/19 1647  GLUCAP 161* 113* 99 145* 194*   D-Dimer No results for input(s): DDIMER in the last 72 hours. Hgb A1c No results for input(s): HGBA1C in the last 72 hours. Lipid Profile No results for input(s): CHOL, HDL, LDLCALC, TRIG, CHOLHDL, LDLDIRECT in the last 72 hours. Thyroid function studies Recent Labs    02/02/19 0521  TSH 0.060*   Anemia work up Recent Labs    02/03/19 1220  TIBC 300  IRON 61   Urinalysis    Component Value Date/Time   COLORURINE YELLOW 10/08/2018 0943  APPEARANCEUR CLEAR 10/08/2018 0943   LABSPEC 1.011 10/08/2018 [place] 7.0 10/08/2018 0943   GLUCOSEU NEGATIVE 10/08/2018 [place] 10/08/2018 [place] 10/08/2018 [place] 10/08/2018 0943   PROTEINUR NEGATIVE 10/08/2018 0943   NITRITE NEGATIVE 10/08/2018 0943   LEUKOCYTESUR NEGATIVE 10/08/2018 0943   Sepsis Labs Invalid input(s): PROCALCITONIN,  WBC,  LACTICIDVEN Microbiology Recent Results (from the past 240 hour(s))  Blood Culture (routine x 2)     Status: None (Preliminary result)   Collection Time: 01/31/19  4:15 AM   Specimen: BLOOD  Result Value Ref Range Status   Specimen  Description BLOOD RIGHT HAND  Final   Special Requests   Final    BOTTLES DRAWN AEROBIC ONLY Blood Culture adequate volume   Culture   Final    NO GROWTH 4 DAYS Performed at [facility], [place] [address], [place], [place] [address]    Report Status PENDING  Incomplete  Blood Culture (routine x 2)     Status: None (Preliminary result)   Collection Time: 01/31/19  4:24 AM   Specimen: BLOOD  Result Value Ref Range Status   Specimen Description BLOOD RIGHT ARM  Final   Special Requests   Final    BOTTLES DRAWN AEROBIC AND ANAEROBIC Blood Culture adequate volume   Culture   Final    NO GROWTH 4 DAYS Performed at [facility], [place] [address], [place], [place] [address]    Report Status PENDING  Incomplete  Respiratory Panel by RT PCR (Flu A&B, Covid) - Nasopharyngeal Swab     Status: Abnormal   Collection Time: 01/31/19  6:01 AM   Specimen: Nasopharyngeal Swab  Result Value Ref Range Status   SARS Coronavirus 2 by RT PCR POSITIVE (A) NEGATIVE Final    Comment: RESULT CALLED TO, READ BACK BY AND VERIFIED WITH: RN LESLIE S. [id number] FCP (NOTE) SARS-CoV-2 target nucleic acids are DETECTED. SARS-CoV-2 RNA is generally detectable in upper respiratory specimens  during the acute phase of infection. Positive results are indicative of the presence of the identified virus, but do not rule out bacterial infection or co-infection with other pathogens not detected by the test. Clinical correlation with patient history and other diagnostic information is necessary to determine patient infection status. The expected result is Negative. Fact Sheet for Patients:  PinkCheek.be Fact Sheet for Healthcare Providers: GravelBags.it This test is not yet approved or cleared by the [place] FDA and  has been authorized for detection and/or diagnosis of SARS-CoV-2 by FDA under an Emergency Use Authorization (EUA).  This EUA  will remain in effect (meaning this test can be used) for the  duration of  the COVID-19 declaration under Section 564(b)(1) of the Act, 21 U.S.C. section 360bbb-3(b)(1), unless the authorization is terminated or revoked sooner.    Influenza A by PCR NEGATIVE NEGATIVE Final   Influenza B by PCR NEGATIVE NEGATIVE Final    Comment: (NOTE) The Xpert Xpress SARS-CoV-2/FLU/RSV assay is intended as an aid in  the diagnosis of influenza from Nasopharyngeal swab specimens and  should not be used as a sole basis for treatment. Nasal washings and  aspirates are unacceptable for Xpert Xpress SARS-CoV-2/FLU/RSV  testing. Fact Sheet for Patients: PinkCheek.be Fact Sheet for Healthcare Providers: GravelBags.it This test is not yet approved or cleared by the [place] FDA and  has been authorized for detection and/or diagnosis of SARS-CoV-2 by  FDA under an Emergency  Use Authorization (EUA). This EUA will remain  in effect (meaning this test can be used) for the duration of the  Covid-19 declaration under Section 564(b)(1) of the Act, 21  U.S.C. section 360bbb-3(b)(1), unless the authorization is  terminated or revoked. Performed at [facility], [place] [address] [place], [place] [address]      Time coordinating discharge:38  min  SIGNED:   Hosie Poisson, MD  [facility]

## 2019-02-04 NOTE — TOC Transition Note (Signed)
Transition of Care [facility]) - CM/SW Discharge Note   Patient Details  Name: Meldoy Guffey MRN: [id number] Date of Birth: 1940-11-17  [facility]) CM/SW Contact:  Carles Collet, RN Phone Number: 02/04/2019, 2:05 PM   Clinical Narrative:    Alvis Lemmings notified of DC    Final next level of care: [place] Barriers to Discharge: No Barriers Identified   Patient Goals and CMS Choice Patient states their goals for this hospitalization and ongoing recovery are:: to go home CMS Medicare.gov Compare Post Acute Care list provided to:: Other (Comment Required) Choice offered to / list presented to : Adult Children  Discharge Placement                       Discharge Plan and Services                          HH Arranged: PT, OT HH Agency: [facility]        Social Determinants of Health (SDOH) Interventions     Readmission Risk Interventions No flowsheet data found.

## 2019-02-05 LAB — FOLATE RBC
Folate, Hemolysate: 439 ng/mL
Folate, RBC: 1322 ng/mL (ref >498)
Hematocrit: 33.2 % — ABNORMAL LOW (ref 34.0–46.6)

## 2019-02-05 LAB — CULTURE, BLOOD (ROUTINE X 2)
Culture: NO GROWTH
Culture: NO GROWTH
Special Requests: ADEQUATE
Special Requests: ADEQUATE

## 2019-12-31 ENCOUNTER — Other Ambulatory Visit: Payer: Self-pay | Admitting: Internal Medicine

## 2019-12-31 DIAGNOSIS — Z1231 Encounter for screening mammogram for malignant neoplasm of breast: Secondary | ICD-10-CM

## 2020-01-02 ENCOUNTER — Other Ambulatory Visit: Payer: Self-pay | Admitting: Internal Medicine

## 2020-01-02 DIAGNOSIS — M810 Age-related osteoporosis without current pathological fracture: Secondary | ICD-10-CM

## 2020-01-10 ENCOUNTER — Other Ambulatory Visit: Payer: Self-pay | Admitting: Orthopedic Surgery

## 2020-01-10 DIAGNOSIS — M25512 Pain in left shoulder: Secondary | ICD-10-CM

## 2020-01-24 ENCOUNTER — Ambulatory Visit
Admission: RE | Admit: 2020-01-24 | Discharge: 2020-01-24 | Disposition: A | Discharge status: Home / Self Care | Payer: Medicare HMO | Source: Ambulatory Visit | Attending: Orthopedic Surgery | Admitting: Orthopedic Surgery

## 2020-01-24 ENCOUNTER — Other Ambulatory Visit: Payer: Self-pay

## 2020-01-24 DIAGNOSIS — M25512 Pain in left shoulder: Secondary | ICD-10-CM

## 2020-02-06 ENCOUNTER — Other Ambulatory Visit: Payer: Self-pay

## 2020-02-06 ENCOUNTER — Ambulatory Visit
Admission: RE | Admit: 2020-02-06 | Discharge: 2020-02-06 | Disposition: A | Discharge status: Home / Self Care | Payer: Medicare HMO | Source: Ambulatory Visit | Attending: Internal Medicine | Admitting: Internal Medicine

## 2020-02-06 DIAGNOSIS — Z1231 Encounter for screening mammogram for malignant neoplasm of breast: Secondary | ICD-10-CM

## 2020-04-09 ENCOUNTER — Other Ambulatory Visit: Payer: Self-pay | Admitting: Internal Medicine

## 2020-04-10 LAB — COMPLETE METABOLIC PANEL WITH GFR
AG Ratio: 1.3 (calc) (ref 1.0–2.5)
ALT: 6 U/L (ref 6–29)
AST: 16 U/L (ref 10–35)
Albumin: 4.2 g/dL (ref 3.6–5.1)
Alkaline phosphatase (APISO): 54 U/L (ref 37–153)
BUN/Creatinine Ratio: 23 (calc) — ABNORMAL HIGH (ref 6–22)
BUN: 41 mg/dL — ABNORMAL HIGH (ref 7–25)
CO2: 30 mmol/L (ref 20–32)
Calcium: 9.5 mg/dL (ref 8.6–10.4)
Chloride: 92 mmol/L — ABNORMAL LOW (ref 98–110)
Creat: 1.82 mg/dL — ABNORMAL HIGH (ref 0.60–0.93)
GFR, Est African American: 30 mL/min/{1.73_m2} — ABNORMAL LOW (ref >=60)
GFR, Est Non African American: 26 mL/min/{1.73_m2} — ABNORMAL LOW (ref >=60)
Globulin: 3.2 g/dL (calc) (ref 1.9–3.7)
Glucose, Bld: 125 mg/dL — ABNORMAL HIGH (ref 65–99)
Potassium: 4.2 mmol/L (ref 3.5–5.3)
Sodium: 135 mmol/L (ref 135–146)
Total Bilirubin: 0.3 mg/dL (ref 0.2–1.2)
Total Protein: 7.4 g/dL (ref 6.1–8.1)

## 2020-04-10 LAB — CBC
HCT: 39 % (ref 35.0–45.0)
Hemoglobin: 12.8 g/dL (ref 11.7–15.5)
MCH: 27.5 pg (ref 27.0–33.0)
MCHC: 32.8 g/dL (ref 32.0–36.0)
MCV: 83.9 fL (ref 80.0–100.0)
MPV: 11.5 fL (ref 7.5–12.5)
Platelets: 330 10*3/uL (ref 140–400)
RBC: 4.65 10*6/uL (ref 3.80–5.10)
RDW: 13.9 % (ref 11.0–15.0)
WBC: 7.2 10*3/uL (ref 3.8–10.8)

## 2020-04-10 LAB — LIPID PANEL
Cholesterol: 204 mg/dL — ABNORMAL HIGH (ref <200)
HDL: 65 mg/dL (ref >=50)
LDL Cholesterol (Calc): 114 mg/dL (calc) — ABNORMAL HIGH
Non-HDL Cholesterol (Calc): 139 mg/dL (calc) — ABNORMAL HIGH (ref <130)
Total CHOL/HDL Ratio: 3.1 (calc) (ref <5.0)
Triglycerides: 141 mg/dL (ref <150)

## 2020-04-10 LAB — T3: T3, Total: 102 ng/dL (ref 76–181)

## 2020-04-10 LAB — TSH: TSH: 1.17 mIU/L (ref 0.40–4.50)

## 2020-04-10 LAB — T3 UPTAKE: T3 Uptake: 29 % (ref 22–35)

## 2020-04-10 LAB — T4, FREE: Free T4: 1.3 ng/dL (ref 0.8–1.8)

## 2020-05-21 ENCOUNTER — Other Ambulatory Visit: Payer: Self-pay | Admitting: Internal Medicine

## 2020-05-22 LAB — CBC
HCT: 39.3 % (ref 35.0–45.0)
Hemoglobin: 12.6 g/dL (ref 11.7–15.5)
MCH: 27.6 pg (ref 27.0–33.0)
MCHC: 32.1 g/dL (ref 32.0–36.0)
MCV: 86.2 fL (ref 80.0–100.0)
MPV: 10.4 fL (ref 7.5–12.5)
Platelets: 346 10*3/uL (ref 140–400)
RBC: 4.56 10*6/uL (ref 3.80–5.10)
RDW: 13.6 % (ref 11.0–15.0)
WBC: 5.7 10*3/uL (ref 3.8–10.8)

## 2020-05-22 LAB — BASIC METABOLIC PANEL WITH GFR
BUN/Creatinine Ratio: 19 (calc) (ref 6–22)
BUN: 25 mg/dL (ref 7–25)
CO2: 29 mmol/L (ref 20–32)
Calcium: 9.5 mg/dL (ref 8.6–10.4)
Chloride: 96 mmol/L — ABNORMAL LOW (ref 98–110)
Creat: 1.32 mg/dL — ABNORMAL HIGH (ref 0.60–0.93)
GFR, Est African American: 44 mL/min/{1.73_m2} — ABNORMAL LOW (ref >=60)
GFR, Est Non African American: 38 mL/min/{1.73_m2} — ABNORMAL LOW (ref >=60)
Glucose, Bld: 88 mg/dL (ref 65–99)
Potassium: 3.8 mmol/L (ref 3.5–5.3)
Sodium: 137 mmol/L (ref 135–146)

## 2020-06-05 DIAGNOSIS — M19012 Primary osteoarthritis, left shoulder: Secondary | ICD-10-CM | POA: Diagnosis present

## 2020-06-11 NOTE — Patient Instructions (Addendum)
DUE TO COVID-19 ONLY ONE VISITOR IS ALLOWED TO COME WITH YOU AND STAY IN THE WAITING ROOM ONLY DURING PRE OP AND PROCEDURE.   **NO VISITORS ARE ALLOWED IN THE SHORT STAY AREA OR RECOVERY ROOM!!**        Your procedure is scheduled on: 06/23/20   Report to [facility] Main  Entrance    Report to admitting at 7:45 AM   Call this number if you have problems the morning of surgery [phone]   Do not eat food :After Midnight.   May have liquids until 7:15 AM day of surgery  CLEAR LIQUID DIET  Foods Allowed                                                                     Foods Excluded  Water, Black Coffee and tea, regular and decaf               liquids that you cannot  Plain Jell-O in any flavor  (No red)                                     see through such as: Fruit ices (not with fruit pulp)                                             milk, soups, orange juice              Iced Popsicles (No red)                                                 All solid food                                   Apple juices Sports drinks like Gatorade (No red) Lightly seasoned clear broth or consume(fat free) Sugar, honey syrup     Complete one G2 drink the morning of surgery at 7:15 AM the day of surgery.     The day of surgery:  Drink ONE (1) G2 by am the morning of surgery. Drink in one sitting. Do not sip.  This drink was given to you during your hospital  pre-op appointment visit. Nothing else to drink after completing the  G2.          If you have questions, please contact your surgeon's office.     Oral Hygiene is also important to reduce your risk of infection.                                    Remember - BRUSH YOUR TEETH THE MORNING OF SURGERY WITH YOUR REGULAR TOOTHPASTE   Do NOT smoke after Midnight   Take these medicines the morning of surgery with A SIP OF WATER:  Amlodipine, Atenolol, Methimazole, Tizanidine.   DO NOT TAKE ANY ORAL DIABETIC MEDICATIONS DAY OF  YOUR SURGERY  How to Manage Your Diabetes Before and After Surgery  Why is it important to control my blood sugar before and after surgery? Improving blood sugar levels before and after surgery helps healing and can limit problems. A way of improving blood sugar control is eating a healthy diet by:  Eating less sugar and carbohydrates  Increasing activity/exercise  Talking with your doctor about reaching your blood sugar goals High blood sugars (greater than 180 mg/dL) can raise your risk of infections and slow your recovery, so you will need to focus on controlling your diabetes during the weeks before surgery. Make sure that the doctor who takes care of your diabetes knows about your planned surgery including the date and location.  How do I manage my blood sugar before surgery? Check your blood sugar at least 4 times a day, starting 2 days before surgery, to make sure that the level is not too high or low. Check your blood sugar the morning of your surgery when you wake up and every 2 hours until you get to the Short Stay unit. If your blood sugar is less than 70 mg/dL, you will need to treat for low blood sugar: Do not take insulin. Treat a low blood sugar (less than 70 mg/dL) with  cup of clear juice (cranberry or apple), 4 glucose tablets, OR glucose gel. Recheck blood sugar in 15 minutes after treatment (to make sure it is greater than 70 mg/dL). If your blood sugar is not greater than 70 mg/dL on recheck, call [phone] for further instructions. Report your blood sugar to the short stay nurse when you get to Short Stay.  If you are admitted to the hospital after surgery: Your blood sugar will be checked by the staff and you will probably be given insulin after surgery (instead of oral diabetes medicines) to make sure you have good blood sugar levels. The goal for blood sugar control after surgery is 80-180 mg/dL.   WHAT DO I DO ABOUT MY DIABETES MEDICATION?  Do not take oral  diabetes medicines (pills) the morning of surgery.  THE DAY BEFORE SURGERY, take Metformin as prescribed.       THE MORNING OF SURGERY, do not take Metformin.    Reviewed and Endorsed by [facility] Patient Education Committee, August 2015                               You may not have any metal on your body including hair pins, jewelry, and body piercing             Do not wear make-up, lotions, powders, perfumes, or deodorant  Do not wear nail polish including gel and S&S, artificial/acrylic nails, or any other type of covering on natural nails including finger and toenails. If you have artificial nails, gel coating, etc. that needs to be removed by a nail salon please have this removed prior to surgery or surgery may need to be canceled/ delayed if the surgeon/ anesthesia feels like they are unable to be safely monitored.   Do not shave  48 hours prior to surgery.    Do not bring valuables to the hospital. [place].   Contacts, dentures or bridgework may not be  worn into surgery.    Patients discharged the day of surgery will not be allowed to drive home.  Special Instructions: Bring a copy of your healthcare power of attorney and living will documents         the day of surgery if you haven't scanned them in before.  Please read over the following fact sheets you were given: IF YOU HAVE QUESTIONS ABOUT YOUR PRE OP INSTRUCTIONS PLEASE CALL [phone]   [facility] - Preparing for Surgery Before surgery, you can play an important role.  Because skin is not sterile, your skin needs to be as free of germs as possible.  You can reduce the number of germs on your skin by washing with CHG (chlorahexidine gluconate) soap before surgery.  CHG is an antiseptic cleaner which kills germs and bonds with the skin to continue killing germs even after washing. Please DO NOT use if you have an allergy to CHG or antibacterial soaps.  If your skin  becomes reddened/irritated stop using the CHG and inform your nurse when you arrive at Short Stay. Do not shave (including legs and underarms) for at least 48 hours prior to the first CHG shower.  You may shave your face/neck.  Please follow these instructions carefully:  1.  Shower with CHG Soap the night before surgery and the  morning of surgery.  2.  If you choose to wash your hair, wash your hair first as usual with your normal  shampoo.  3.  After you shampoo, rinse your hair and body thoroughly to remove the shampoo.                             4.  Use CHG as you would any other liquid soap.  You can apply chg directly to the skin and wash.  Gently with a scrungie or clean washcloth.  5.  Apply the CHG Soap to your body ONLY FROM THE NECK DOWN.   Do   not use on face/ open                           Wound or open sores. Avoid contact with eyes, ears mouth and   genitals (private parts).                       Wash face,  Genitals (private parts) with your normal soap.             6.  Wash thoroughly, paying special attention to the area where your    surgery  will be performed.  7.  Thoroughly rinse your body with warm water from the neck down.  8.  DO NOT shower/wash with your normal soap after using and rinsing off the CHG Soap.                9.  Pat yourself dry with a clean towel.            10.  Wear clean pajamas.            11.  Place clean sheets on your bed the night of your first shower and do not  sleep with pets. Day of Surgery : Do not apply any lotions/deodorants the morning of surgery.  Please wear clean clothes to the hospital/surgery center.  FAILURE TO FOLLOW THESE INSTRUCTIONS MAY RESULT IN THE CANCELLATION OF YOUR SURGERY  PATIENT SIGNATURE_________________________________  NURSE SIGNATURE__________________________________  ________________________________________________________________________     Mandy [name]  An incentive spirometer is a tool that  can help keep your lungs clear and active. This tool measures how well you are filling your lungs with each breath. Taking long deep breaths may help reverse or decrease the chance of developing breathing (pulmonary) problems (especially infection) following: A long period of time when you are unable to move or be active. BEFORE THE PROCEDURE  If the spirometer includes an indicator to show your best effort, your nurse or respiratory therapist will set it to a desired goal. If possible, sit up straight or lean slightly forward. Try not to slouch. Hold the incentive spirometer in an upright position. INSTRUCTIONS FOR USE  Sit on the edge of your bed if possible, or sit up as far as you can in bed or on a chair. Hold the incentive spirometer in an upright position. Breathe out normally. Place the mouthpiece in your mouth and seal your lips tightly around it. Breathe in slowly and as deeply as possible, raising the piston or the ball toward the top of the column. Hold your breath for 3-5 seconds or for as long as possible. Allow the piston or ball to fall to the bottom of the column. Remove the mouthpiece from your mouth and breathe out normally. Rest for a few seconds and repeat Steps 1 through 7 at least 10 times every 1-2 hours when you are awake. Take your time and take a few normal breaths between deep breaths. The spirometer may include an indicator to show your best effort. Use the indicator as a goal to work toward during each repetition. After each set of 10 deep breaths, practice coughing to be sure your lungs are clear. If you have an incision (the cut made at the time of surgery), support your incision when coughing by placing a pillow or rolled up towels firmly against it. Once you are able to get out of bed, walk around indoors and cough well. You may stop using the incentive spirometer when instructed by your caregiver.  RISKS AND COMPLICATIONS Take your time so you do not get dizzy or  light-headed. If you are in pain, you may need to take or ask for pain medication before doing incentive spirometry. It is harder to take a deep breath if you are having pain. AFTER USE Rest and breathe slowly and easily. It can be helpful to keep track of a log of your progress. Your caregiver can provide you with a simple table to help with this. If you are using the spirometer at home, follow these instructions: [place] IF:  You are having difficultly using the spirometer. You have trouble using the spirometer as often as instructed. Your pain medication is not giving enough relief while using the spirometer. You develop fever of 100.5 F (38.1 C) or higher. SEEK IMMEDIATE MEDICAL CARE IF:  You cough up bloody sputum that had not been present before. You develop fever of 102 F (38.9 C) or greater. You develop worsening pain at or near the incision site. MAKE SURE YOU:  Understand these instructions. Will watch your condition. Will get help right away if you are not doing well or get worse. Document Released: 05/02/2006 Document Revised: 03/14/2011 Document Reviewed: 07/03/2006 ExitCare Patient Information 2014 [place].   ________________________________________________________________________ [facility]- Preparing for Total Shoulder Arthroplasty    Before surgery, you can play an important role. Because skin is not sterile, your  skin needs to be as free of germs as possible. You can reduce the number of germs on your skin by using the following products. Benzoyl Peroxide Gel Reduces the number of germs present on the skin Applied twice a day to shoulder area starting two days before surgery    ==================================================================  Please follow these instructions carefully:  BENZOYL PEROXIDE 5% GEL  Please do not use if you have an allergy to benzoyl peroxide.   If your skin becomes reddened/irritated stop using the benzoyl  peroxide.  Starting two days before surgery, apply as follows: Apply benzoyl peroxide in the morning and at night. Apply after taking a shower. If you are not taking a shower clean entire shoulder front, back, and side along with the armpit with a clean wet washcloth.  Place a quarter-sized dollop on your shoulder and rub in thoroughly, making sure to cover the front, back, and side of your shoulder, along with the armpit.   2 days before ____ AM   ____ PM              1 day before ____ AM   ____ PM                         Do this twice a day for two days.  (Last application is the night before surgery, AFTER using the CHG soap as described below).  Do NOT apply benzoyl peroxide gel on the day of surgery.

## 2020-06-11 NOTE — Progress Notes (Addendum)
COVID Vaccine Completed: Date COVID Vaccine completed: Has received booster: COVID vaccine manufacturer: [place]   Date of COVID positive in last 90 days:  PCP - [name], MD Cardiologist -   Chest x-ray - >1 yr EKG - >1 yr Stress Test -  ECHO - 02/03/19 Epic Cardiac Cath -  Pacemaker/ICD device last checked: Spinal Cord Stimulator:  Sleep Study -  CPAP -   Fasting Blood Sugar -  Checks Blood Sugar _____ times a day  Blood Thinner Instructions: Aspirin Instructions: Last Dose:  Activity level:  Can go up a flight of stairs and perform activities of daily living without stopping and without symptoms of chest pain or shortness of breath.   Able to exercise without symptoms  Unable to go up a flight of stairs without symptoms of      Anesthesia review: DM, HTN, prolonged QT interval, SOB  Patient denies shortness of breath, fever, cough and chest pain at PAT appointment   Patient verbalized understanding of instructions that were given to them at the PAT appointment. Patient was also instructed that they will need to review over the PAT instructions again at home before surgery.

## 2020-06-15 ENCOUNTER — Encounter (HOSPITAL_COMMUNITY)
Admission: RE | Admit: 2020-06-15 | Discharge: 2020-06-15 | Disposition: A | Discharge status: Home / Self Care | Payer: Medicare HMO | Source: Ambulatory Visit | Attending: Anesthesiology | Admitting: Anesthesiology

## 2020-06-16 ENCOUNTER — Encounter (HOSPITAL_COMMUNITY)
Admission: RE | Admit: 2020-06-16 | Discharge: 2020-06-16 | Disposition: A | Discharge status: Home / Self Care | Payer: Medicare HMO | Source: Ambulatory Visit | Attending: Orthopedic Surgery | Admitting: Orthopedic Surgery

## 2020-06-16 ENCOUNTER — Other Ambulatory Visit: Payer: Self-pay

## 2020-06-16 ENCOUNTER — Encounter (HOSPITAL_COMMUNITY): Payer: Self-pay

## 2020-06-16 DIAGNOSIS — Z01818 Encounter for other preprocedural examination: Secondary | ICD-10-CM | POA: Diagnosis present

## 2020-06-16 LAB — BASIC METABOLIC PANEL
Anion gap: 9 (ref 5–15)
BUN: 39 mg/dL — ABNORMAL HIGH (ref 8–23)
CO2: 29 mmol/L (ref 22–32)
Calcium: 9.7 mg/dL (ref 8.9–10.3)
Chloride: 98 mmol/L (ref 98–111)
Creatinine, Ser: 1.44 mg/dL — ABNORMAL HIGH (ref 0.44–1.00)
GFR, Estimated: 37 mL/min — ABNORMAL LOW (ref >60)
Glucose, Bld: 102 mg/dL — ABNORMAL HIGH (ref 70–99)
Potassium: 3.1 mmol/L — ABNORMAL LOW (ref 3.5–5.1)
Sodium: 136 mmol/L (ref 135–145)

## 2020-06-16 LAB — CBC
HCT: 40.1 % (ref 36.0–46.0)
Hemoglobin: 13 g/dL (ref 12.0–15.0)
MCH: 27.3 pg (ref 26.0–34.0)
MCHC: 32.4 g/dL (ref 30.0–36.0)
MCV: 84.2 fL (ref 80.0–100.0)
Platelets: 370 10*3/uL (ref 150–400)
RBC: 4.76 MIL/uL (ref 3.87–5.11)
RDW: 14.6 % (ref 11.5–15.5)
WBC: 6.9 10*3/uL (ref 4.0–10.5)
nRBC: 0 % (ref 0.0–0.2)

## 2020-06-16 LAB — SURGICAL PCR SCREEN
MRSA, PCR: NEGATIVE
Staphylococcus aureus: NEGATIVE

## 2020-06-16 NOTE — Patient Instructions (Signed)
DUE TO COVID-19 ONLY ONE VISITOR IS ALLOWED TO COME WITH YOU AND STAY IN THE WAITING ROOM ONLY DURING PRE OP AND PROCEDURE.   **NO VISITORS ARE ALLOWED IN THE SHORT STAY AREA OR RECOVERY ROOM!!**        Your procedure is scheduled on: 06/23/20   Report to [facility] Main  Entrance    Report to admitting at 7:15 AM   Call this number if you have problems the morning of surgery [phone]   Do not eat food :After Midnight.   May have liquids until 6:45 AM day of surgery  CLEAR LIQUID DIET  Foods Allowed                                                                     Foods Excluded  Water, Black Coffee and tea, regular and decaf               liquids that you cannot  Plain Jell-O in any flavor  (No red)                                     see through such as: Fruit ices (not with fruit pulp)                                             milk, soups, orange juice              Iced Popsicles (No red)                                                 All solid food                                   Apple juices Sports drinks like Gatorade (No red) Lightly seasoned clear broth or consume(fat free) Sugar, honey syrup     Complete one G2 drink the morning of surgery at 6:45 AM the day of surgery.      The day of surgery:  Drink ONE (1) G2 by 6:45 am the morning of surgery. Drink in one sitting. Do not sip.  This drink was given to you during your hospital  pre-op appointment visit. Nothing else to drink after completing the  G2.          If you have questions, please contact your surgeon's office.     Oral Hygiene is also important to reduce your risk of infection.                                    Remember - BRUSH YOUR TEETH THE MORNING OF SURGERY WITH YOUR REGULAR TOOTHPASTE   Do NOT smoke after Midnight   Take these medicines the morning of surgery with A SIP  OF WATER: Amlodipine, Atenolol, Methimazole, Tizanidine.   DO NOT TAKE ANY ORAL DIABETIC MEDICATIONS  DAY OF YOUR SURGERY  How to Manage Your Diabetes Before and After Surgery  Why is it important to control my blood sugar before and after surgery? Improving blood sugar levels before and after surgery helps healing and can limit problems. A way of improving blood sugar control is eating a healthy diet by:  Eating less sugar and carbohydrates  Increasing activity/exercise  Talking with your doctor about reaching your blood sugar goals High blood sugars (greater than 180 mg/dL) can raise your risk of infections and slow your recovery, so you will need to focus on controlling your diabetes during the weeks before surgery. Make sure that the doctor who takes care of your diabetes knows about your planned surgery including the date and location.  How do I manage my blood sugar before surgery? Check your blood sugar at least 4 times a day, starting 2 days before surgery, to make sure that the level is not too high or low. Check your blood sugar the morning of your surgery when you wake up and every 2 hours until you get to the Short Stay unit. If your blood sugar is less than 70 mg/dL, you will need to treat for low blood sugar: Do not take insulin. Treat a low blood sugar (less than 70 mg/dL) with  cup of clear juice (cranberry or apple), 4 glucose tablets, OR glucose gel. Recheck blood sugar in 15 minutes after treatment (to make sure it is greater than 70 mg/dL). If your blood sugar is not greater than 70 mg/dL on recheck, call [phone] for further instructions. Report your blood sugar to the short stay nurse when you get to Short Stay.  If you are admitted to the hospital after surgery: Your blood sugar will be checked by the staff and you will probably be given insulin after surgery (instead of oral diabetes medicines) to make sure you have good blood sugar levels. The goal for blood sugar control after surgery is 80-180 mg/dL.   WHAT DO I DO ABOUT MY DIABETES MEDICATION?  Do not  take oral diabetes medicines (pills) the morning of surgery.  THE DAY BEFORE SURGERY, take  Metformin as prescribed.       THE MORNING OF SURGERY, do not take Metformin.   Reviewed and Endorsed by [facility] Patient Education Committee, August 2015                               You may not have any metal on your body including hair pins, jewelry, and body piercing             Do not wear make-up, lotions, powders, perfumes, or deodorant  Do not wear nail polish including gel and S&S, artificial/acrylic nails, or any other type of covering on natural nails including finger and toenails. If you have artificial nails, gel coating, etc. that needs to be removed by a nail salon please have this removed prior to surgery or surgery may need to be canceled/ delayed if the surgeon/ anesthesia feels like they are unable to be safely monitored.   Do not shave  48 hours prior to surgery.    Do not bring valuables to the hospital. [place].   Contacts, dentures or bridgework may  not be worn into surgery.    Patients discharged the day of surgery will not be allowed to drive home.  Special Instructions: Bring a copy of your healthcare power of attorney and living will documents         the day of surgery if you haven't scanned them in before.              Please read over the following fact sheets you were given: IF YOU HAVE QUESTIONS ABOUT YOUR PRE OP INSTRUCTIONS PLEASE CALL [phone]   [place] - Preparing for Surgery Before surgery, you can play an important role.  Because skin is not sterile, your skin needs to be as free of germs as possible.  You can reduce the number of germs on your skin by washing with CHG (chlorahexidine gluconate) soap before surgery.  CHG is an antiseptic cleaner which kills germs and bonds with the skin to continue killing germs even after washing. Please DO NOT use if you have an allergy to CHG or antibacterial  soaps.  If your skin becomes reddened/irritated stop using the CHG and inform your nurse when you arrive at Short Stay. Do not shave (including legs and underarms) for at least 48 hours prior to the first CHG shower.  You may shave your face/neck.  Please follow these instructions carefully:  1.  Shower with CHG Soap the night before surgery and the  morning of surgery.  2.  If you choose to wash your hair, wash your hair first as usual with your normal  shampoo.  3.  After you shampoo, rinse your hair and body thoroughly to remove the shampoo.                             4.  Use CHG as you would any other liquid soap.  You can apply chg directly to the skin and wash.  Gently with a scrungie or clean washcloth.  5.  Apply the CHG Soap to your body ONLY FROM THE NECK DOWN.   Do   not use on face/ open                           Wound or open sores. Avoid contact with eyes, ears mouth and   genitals (private parts).                       Wash face,  Genitals (private parts) with your normal soap.             6.  Wash thoroughly, paying special attention to the area where your    surgery  will be performed.  7.  Thoroughly rinse your body with warm water from the neck down.  8.  DO NOT shower/wash with your normal soap after using and rinsing off the CHG Soap.                9.  Pat yourself dry with a clean towel.            10.  Wear clean pajamas.            11.  Place clean sheets on your bed the night of your first shower and do not  sleep with pets. Day of Surgery : Do not apply any lotions/deodorants the morning of surgery.  Please wear clean clothes to the hospital/surgery center.  FAILURE TO FOLLOW THESE INSTRUCTIONS MAY RESULT IN THE CANCELLATION OF YOUR SURGERY  PATIENT SIGNATURE_________________________________  NURSE SIGNATURE__________________________________  ________________________________________________________________________   Adam Phenix  An incentive  spirometer is a tool that can help keep your lungs clear and active. This tool measures how well you are filling your lungs with each breath. Taking long deep breaths may help reverse or decrease the chance of developing breathing (pulmonary) problems (especially infection) following: A long period of time when you are unable to move or be active. BEFORE THE PROCEDURE  If the spirometer includes an indicator to show your best effort, your nurse or respiratory therapist will set it to a desired goal. If possible, sit up straight or lean slightly forward. Try not to slouch. Hold the incentive spirometer in an upright position. INSTRUCTIONS FOR USE  Sit on the edge of your bed if possible, or sit up as far as you can in bed or on a chair. Hold the incentive spirometer in an upright position. Breathe out normally. Place the mouthpiece in your mouth and seal your lips tightly around it. Breathe in slowly and as deeply as possible, raising the piston or the ball toward the top of the column. Hold your breath for 3-5 seconds or for as long as possible. Allow the piston or ball to fall to the bottom of the column. Remove the mouthpiece from your mouth and breathe out normally. Rest for a few seconds and repeat Steps 1 through 7 at least 10 times every 1-2 hours when you are awake. Take your time and take a few normal breaths between deep breaths. The spirometer may include an indicator to show your best effort. Use the indicator as a goal to work toward during each repetition. After each set of 10 deep breaths, practice coughing to be sure your lungs are clear. If you have an incision (the cut made at the time of surgery), support your incision when coughing by placing a pillow or rolled up towels firmly against it. Once you are able to get out of bed, walk around indoors and cough well. You may stop using the incentive spirometer when instructed by your caregiver.  RISKS AND COMPLICATIONS Take your time  so you do not get dizzy or light-headed. If you are in pain, you may need to take or ask for pain medication before doing incentive spirometry. It is harder to take a deep breath if you are having pain. AFTER USE Rest and breathe slowly and easily. It can be helpful to keep track of a log of your progress. Your caregiver can provide you with a simple table to help with this. If you are using the spirometer at home, follow these instructions: [place] IF:  You are having difficultly using the spirometer. You have trouble using the spirometer as often as instructed. Your pain medication is not giving enough relief while using the spirometer. You develop fever of 100.5 F (38.1 C) or higher. SEEK IMMEDIATE MEDICAL CARE IF:  You cough up bloody sputum that had not been present before. You develop fever of 102 F (38.9 C) or greater. You develop worsening pain at or near the incision site. MAKE SURE YOU:  Understand these instructions. Will watch your condition. Will get help right away if you are not doing well or get worse. Document Released: 05/02/2006 Document Revised: 03/14/2011 Document Reviewed: 07/03/2006 ExitCare Patient Information 2014 [employer].   ________________________________________________________________________ [facility]- Preparing for Total Shoulder Arthroplasty    Before surgery,  you can play an important role. Because skin is not sterile, your skin needs to be as free of germs as possible. You can reduce the number of germs on your skin by using the following products. Benzoyl Peroxide Gel Reduces the number of germs present on the skin Applied twice a day to shoulder area starting two days before surgery    ==================================================================  Please follow these instructions carefully:  BENZOYL PEROXIDE 5% GEL  Please do not use if you have an allergy to benzoyl peroxide.   If your skin becomes  reddened/irritated stop using the benzoyl peroxide.  Starting two days before surgery, apply as follows: Apply benzoyl peroxide in the morning and at night. Apply after taking a shower. If you are not taking a shower clean entire shoulder front, back, and side along with the armpit with a clean wet washcloth.  Place a quarter-sized dollop on your shoulder and rub in thoroughly, making sure to cover the front, back, and side of your shoulder, along with the armpit.   2 days before ____ AM   ____ PM              1 day before ____ AM   ____ PM                         Do this twice a day for two days.  (Last application is the night before surgery, AFTER using the CHG soap as described below).  Do NOT apply benzoyl peroxide gel on the day of surgery.

## 2020-06-16 NOTE — Progress Notes (Signed)
COVID Vaccine Completed: Yes x2 Date COVID Vaccine completed:  Has received booster: yes x1 COVID vaccine manufacturer: Pfizer     Date of COVID positive in last 90 days: N/A   PCP - [name], MD Cardiologist - N/A   Chest x-ray - >1 yr EKG - 06/16/20 Epic Stress Test - N/A ECHO - 02/03/19 Epic Cardiac Cath - N/A Pacemaker/ICD device last checked: Spinal Cord Stimulator:   Sleep Study - N/A CPAP -   Fasting Blood Sugar -  100s Checks Blood Sugar _2_ times a day   Blood Thinner Instructions: stop 2 weeks before Aspirin Instructions: takes ASA 81mg  Last Dose: 06/07/20   Activity level:   Pt able to perform ADLs without CP or SOB. Pt home does not have stairs.      Anesthesia review: DM, HTN, prolonged QT interval, SOB   Patient denies shortness of breath, fever, cough and chest pain at PAT appointment     Patient verbalized understanding of instructions that were given to them at the PAT appointment. Patient was also instructed that they will need to review over the PAT instructions again at home before surgery.

## 2020-06-17 LAB — HEMOGLOBIN A1C
Hgb A1c MFr Bld: 6.7 % — ABNORMAL HIGH (ref 4.8–5.6)
Mean Plasma Glucose: 146 mg/dL

## 2020-06-18 ENCOUNTER — Encounter (HOSPITAL_COMMUNITY): Payer: Self-pay | Admitting: Physician Assistant

## 2020-06-20 NOTE — H&P (Signed)
SHOULDER ARTHROPLASTY ADMISSION H&P  Patient ID: Mandy [name] MRN: [id number] DOB/AGE: Jan 18, 1940 80 y.o.  Chief Complaint: left shoulder pain.  Planned Procedure Date: 06/23/20 Medical Clearance by Dr. Tollie Pizza     HPI: Mandy [name] is a 80 y.o. female who presents for evaluation of djd left shoulder. The patient has a history of pain and functional disability in the left shoulder due to arthritis and has failed non-surgical conservative treatments for greater than 12 weeks to include NSAID's and/or analgesics, corticosteriod injections, and activity modification.  Onset of symptoms was gradual, starting 5 years ago with gradually worsening course since that time. The patient noted no past surgery on the left shoulder.  Patient currently rates pain at 10 out of 10 with activity. Patient has worsening of pain with activity and weight bearing and pain that interferes with activities of daily living.  Patient has evidence of joint space narrowing by imaging studies.  There is no active infection.  Past Medical History:  Diagnosis Date   Aortic atherosclerosis ([place]) 10/08/2018   Diabetes mellitus without complication ([place])    Hepatic steatosis 10/08/2018   Hyperlipidemia    Hypertension    Hypothyroidism    Mass of right ovary 10/08/2018   Osteoporosis    SBO (small bowel obstruction) ([place]) 08/09/2018   SBO (small bowel obstruction) ([place]) 08/09/2018   Past Surgical History:  Procedure Laterality Date   CATARACT EXTRACTION W/ INTRAOCULAR LENS  IMPLANT, BILATERAL     COLONOSCOPY     No Known Allergies Prior to Admission medications   Medication Sig Start Date End Date Taking? Authorizing Provider  alendronate (FOSAMAX) 70 MG tablet Take 70 mg by mouth once a week. Take with a full glass of water on an empty stomach.   Yes [provider]  amLODipine (NORVASC) 10 MG tablet Take 10 mg by mouth daily.   Yes [provider]  atenolol (TENORMIN) 50 MG tablet Take 50 mg  by mouth 2 (two) times daily.   Yes [provider]  benazepril-hydrochlorthiazide (LOTENSIN HCT) 20-25 MG tablet Take 1 tablet by mouth daily. 01/08/19  Yes [provider]  calcium carbonate (OSCAL) 1500 (600 Ca) MG TABS tablet Take 600 mg of elemental calcium by mouth 2 (two) times daily with a meal.   Yes [provider]  diclofenac sodium (VOLTAREN) 1 % GEL Apply 4 g topically 4 (four) times daily as needed for pain. 09/13/18  Yes [provider]  gabapentin (NEURONTIN) 100 MG capsule Take 100 mg by mouth at bedtime. 07/18/18  Yes [provider]  guaiFENesin-dextromethorphan (ROBITUSSIN DM) 100-10 MG/5ML syrup Take 10 mLs by mouth every 4 (four) hours as needed for cough. 02/04/19  Yes Hosie Poisson, MD  metFORMIN (GLUCOPHAGE) 500 MG tablet Take 500 mg by mouth 2 (two) times daily with a meal.    Yes [provider]  methimazole (TAPAZOLE) 5 MG tablet Take 0.5 tablets (2.5 mg total) by mouth daily. 02/04/19  Yes Hosie Poisson, MD  Multiple Vitamin (MULTIVITAMIN WITH MINERALS) TABS tablet Take 1 tablet by mouth daily.   Yes [provider]  simvastatin (ZOCOR) 10 MG tablet Take 10 mg by mouth at bedtime. 04/08/18  Yes [provider]  tiZANidine (ZANAFLEX) 4 MG tablet Take 4 mg by mouth 2 (two) times daily.  07/19/18  Yes [provider]  zinc gluconate 50 MG tablet Take 50 mg by mouth daily.   Yes [provider]  albuterol (VENTOLIN HFA) 108 (90 Base) MCG/ACT inhaler  Inhale 2 puffs into the lungs every 6 (six) hours as needed for wheezing or shortness of breath. Patient not taking: No sig reported 02/04/19   Hosie Poisson, MD  aspirin EC 81 MG tablet Take 81 mg by mouth daily. Swallow whole. Patient not taking: Reported on 06/09/2020    [provider]  furosemide (LASIX) 20 MG tablet Take 1 tablet (20 mg total) by mouth daily. Patient not taking: Reported on 06/09/2020 02/04/19 04/05/19  Hosie Poisson, MD   pantoprazole (PROTONIX) 40 MG tablet Take 1 tablet (40 mg total) by mouth daily at 6 (six) AM. Patient not taking: Reported on 06/09/2020 02/05/19   Hosie Poisson, MD  zinc sulfate 220 (50 Zn) MG capsule Take 1 capsule (220 mg total) by mouth daily. Patient not taking: Reported on 06/09/2020 02/05/19   Hosie Poisson, MD   Social History   Socioeconomic History   Marital status: Married    Spouse name: Not on file   Number of children: Not on file   Years of education: Not on file   Highest education level: Not on file  Occupational History   Not on file  Tobacco Use   Smoking status: Never   Smokeless tobacco: Never  Vaping Use   Vaping Use: Never used  Substance and Sexual Activity   Alcohol use: No   Drug use: No   Sexual activity: Not on file  Other Topics Concern   Not on file  Social History Narrative   Patient is from [place]   Speaks Krio   Social Determinants of Health   Financial Resource Strain: Not on file  Food Insecurity: Not on file  Transportation Needs: Not on file  Physical Activity: Not on file  Stress: Not on file  Social Connections: Not on file   Family History  Problem Relation Age of Onset   Kidney disease Son    Colon cancer Neg Hx    Breast cancer Neg Hx     ROS: Currently denies lightheadedness, dizziness, Fever, chills, CP, SOB.   No personal history of DVT, PE, MI, or CVA. No loose teeth or dentures All other systems have been reviewed and were otherwise currently negative with the exception of those mentioned in the HPI and as above.  Objective: Vitals: Ht: 4\' 10"  Wt: 177.6 lbs Temp: 98.1 BP: 142/72 Pulse: 71 O2 97% on room air.   Physical Exam: General: Alert, NAD.   HEENT: EOMI, Good Neck Extension  Pulm: No increased work of breathing.  Clear B/L A/P w/o crackle or wheeze.  CV: RRR, No m/g/r appreciated  GI: soft, NT, ND Neuro: Neuro without gross focal deficit.  Sensation intact distally Skin: No lesions in the area of chief  complaint MSK/Surgical Site: left shoulder pain with range of motion.  Forward flexion/abduction approximately 0-100.  Internal rotation to mid left thigh.  External rotation to 0.  Neg AC pain.  Neg Biceps pain.  Intact Rotator cuff strength.  NVI distally.  Imaging Review Plain radiographs demonstrate severe degenerative joint disease of the left shoulder.    Assessment: djd left shoulder Principal Problem:   Osteoarthritis of left shoulder   Plan: Plan for Procedure(s): TOTAL SHOULDER ARTHROPLASTY  The patient history, physical exam, clinical judgement of the provider and imaging are consistent with end stage degenerative joint disease and total joint arthroplasty is deemed medically necessary. The treatment options including medical management, injection therapy, and arthroplasty were discussed at length. The risks and benefits of Procedure(s): TOTAL SHOULDER  ARTHROPLASTY were presented and reviewed.  The risks of nonoperative treatment, versus surgical intervention including but not limited to continued pain, aseptic loosening, stiffness, dislocation/subluxation, infection, bleeding, nerve injury, blood clots, cardiopulmonary complications, morbidity, mortality, among others were discussed. The patient verbalizes understanding and wishes to proceed with the plan.  Patient is being admitted for surgery, OT, pain control, prophylactic antibiotics, VTE prophylaxis, progressive ambulation, ADL's and discharge planning.   Dental prophylaxis discussed and recommended for 2 years postoperatively.  The patient does meet the criteria for TXA which will be used perioperatively.   The patient is planning to be discharged home care of daughter.    Ventura Bruns, PA-C 06/20/2020 10:38 AM

## 2020-06-23 ENCOUNTER — Ambulatory Visit (HOSPITAL_COMMUNITY): Admission: RE | Admit: 2020-06-23 | Payer: Medicare HMO | Source: Ambulatory Visit | Admitting: Orthopedic Surgery

## 2020-06-23 ENCOUNTER — Encounter (HOSPITAL_COMMUNITY): Admission: RE | Payer: Self-pay | Source: Ambulatory Visit

## 2020-06-23 DIAGNOSIS — M19012 Primary osteoarthritis, left shoulder: Secondary | ICD-10-CM | POA: Diagnosis present

## 2020-06-23 SURGERY — ARTHROPLASTY, SHOULDER, TOTAL
Anesthesia: Choice | Site: Shoulder | Laterality: Left

## 2020-06-26 ENCOUNTER — Other Ambulatory Visit: Payer: Self-pay

## 2020-06-26 ENCOUNTER — Inpatient Hospital Stay (HOSPITAL_COMMUNITY)
Admission: EM | Admit: 2020-06-26 | Discharge: 2020-06-30 | DRG: 177 | Disposition: A | Discharge status: Home / Self Care | Payer: Medicare HMO | Attending: Internal Medicine | Admitting: Internal Medicine

## 2020-06-26 ENCOUNTER — Emergency Department (HOSPITAL_COMMUNITY): Payer: Medicare HMO

## 2020-06-26 ENCOUNTER — Encounter (HOSPITAL_COMMUNITY): Payer: Self-pay | Admitting: Internal Medicine

## 2020-06-26 DIAGNOSIS — E785 Hyperlipidemia, unspecified: Secondary | ICD-10-CM | POA: Diagnosis present

## 2020-06-26 DIAGNOSIS — R112 Nausea with vomiting, unspecified: Secondary | ICD-10-CM

## 2020-06-26 DIAGNOSIS — Z79899 Other long term (current) drug therapy: Secondary | ICD-10-CM

## 2020-06-26 DIAGNOSIS — J9601 Acute respiratory failure with hypoxia: Secondary | ICD-10-CM | POA: Diagnosis present

## 2020-06-26 DIAGNOSIS — E669 Obesity, unspecified: Secondary | ICD-10-CM | POA: Diagnosis present

## 2020-06-26 DIAGNOSIS — K760 Fatty (change of) liver, not elsewhere classified: Secondary | ICD-10-CM | POA: Diagnosis present

## 2020-06-26 DIAGNOSIS — I272 Pulmonary hypertension, unspecified: Secondary | ICD-10-CM | POA: Diagnosis present

## 2020-06-26 DIAGNOSIS — I700 Atherosclerosis of aorta: Secondary | ICD-10-CM | POA: Diagnosis present

## 2020-06-26 DIAGNOSIS — N1831 Chronic kidney disease, stage 3a: Secondary | ICD-10-CM | POA: Diagnosis present

## 2020-06-26 DIAGNOSIS — E1122 Type 2 diabetes mellitus with diabetic chronic kidney disease: Secondary | ICD-10-CM | POA: Diagnosis present

## 2020-06-26 DIAGNOSIS — Z841 Family history of disorders of kidney and ureter: Secondary | ICD-10-CM

## 2020-06-26 DIAGNOSIS — E860 Dehydration: Secondary | ICD-10-CM | POA: Diagnosis present

## 2020-06-26 DIAGNOSIS — R221 Localized swelling, mass and lump, neck: Secondary | ICD-10-CM

## 2020-06-26 DIAGNOSIS — E871 Hypo-osmolality and hyponatremia: Secondary | ICD-10-CM | POA: Diagnosis present

## 2020-06-26 DIAGNOSIS — D4989 Neoplasm of unspecified behavior of other specified sites: Secondary | ICD-10-CM | POA: Diagnosis not present

## 2020-06-26 DIAGNOSIS — J398 Other specified diseases of upper respiratory tract: Secondary | ICD-10-CM | POA: Diagnosis present

## 2020-06-26 DIAGNOSIS — N179 Acute kidney failure, unspecified: Secondary | ICD-10-CM | POA: Diagnosis present

## 2020-06-26 DIAGNOSIS — R531 Weakness: Secondary | ICD-10-CM | POA: Diagnosis not present

## 2020-06-26 DIAGNOSIS — Z6837 Body mass index (BMI) 37.0-37.9, adult: Secondary | ICD-10-CM

## 2020-06-26 DIAGNOSIS — J1282 Pneumonia due to coronavirus disease 2019: Secondary | ICD-10-CM | POA: Diagnosis present

## 2020-06-26 DIAGNOSIS — R7989 Other specified abnormal findings of blood chemistry: Secondary | ICD-10-CM | POA: Diagnosis not present

## 2020-06-26 DIAGNOSIS — I5032 Chronic diastolic (congestive) heart failure: Secondary | ICD-10-CM | POA: Diagnosis present

## 2020-06-26 DIAGNOSIS — B3781 Candidal esophagitis: Secondary | ICD-10-CM | POA: Diagnosis not present

## 2020-06-26 DIAGNOSIS — E876 Hypokalemia: Secondary | ICD-10-CM | POA: Diagnosis present

## 2020-06-26 DIAGNOSIS — Z7983 Long term (current) use of bisphosphonates: Secondary | ICD-10-CM

## 2020-06-26 DIAGNOSIS — U071 COVID-19: Principal | ICD-10-CM | POA: Diagnosis present

## 2020-06-26 DIAGNOSIS — R42 Dizziness and giddiness: Secondary | ICD-10-CM | POA: Diagnosis not present

## 2020-06-26 DIAGNOSIS — E0590 Thyrotoxicosis, unspecified without thyrotoxic crisis or storm: Secondary | ICD-10-CM | POA: Diagnosis present

## 2020-06-26 DIAGNOSIS — Z7984 Long term (current) use of oral hypoglycemic drugs: Secondary | ICD-10-CM

## 2020-06-26 DIAGNOSIS — I160 Hypertensive urgency: Secondary | ICD-10-CM | POA: Diagnosis present

## 2020-06-26 DIAGNOSIS — E119 Type 2 diabetes mellitus without complications: Secondary | ICD-10-CM

## 2020-06-26 DIAGNOSIS — K219 Gastro-esophageal reflux disease without esophagitis: Secondary | ICD-10-CM | POA: Diagnosis present

## 2020-06-26 DIAGNOSIS — E118 Type 2 diabetes mellitus with unspecified complications: Secondary | ICD-10-CM

## 2020-06-26 DIAGNOSIS — E039 Hypothyroidism, unspecified: Secondary | ICD-10-CM | POA: Diagnosis present

## 2020-06-26 DIAGNOSIS — I130 Hypertensive heart and chronic kidney disease with heart failure and stage 1 through stage 4 chronic kidney disease, or unspecified chronic kidney disease: Secondary | ICD-10-CM | POA: Diagnosis present

## 2020-06-26 DIAGNOSIS — M810 Age-related osteoporosis without current pathological fracture: Secondary | ICD-10-CM | POA: Diagnosis present

## 2020-06-26 DIAGNOSIS — T380X5A Adverse effect of glucocorticoids and synthetic analogues, initial encounter: Secondary | ICD-10-CM | POA: Diagnosis not present

## 2020-06-26 HISTORY — DX: Thyrotoxicosis, unspecified without thyrotoxic crisis or storm: E05.90

## 2020-06-26 LAB — COMPREHENSIVE METABOLIC PANEL
ALT: 12 U/L (ref 0–44)
AST: 24 U/L (ref 15–41)
Albumin: 3.5 g/dL (ref 3.5–5.0)
Alkaline Phosphatase: 47 U/L (ref 38–126)
Anion gap: 11 (ref 5–15)
BUN: 19 mg/dL (ref 8–23)
CO2: 31 mmol/L (ref 22–32)
Calcium: 9.2 mg/dL (ref 8.9–10.3)
Chloride: 86 mmol/L — ABNORMAL LOW (ref 98–111)
Creatinine, Ser: 1.17 mg/dL — ABNORMAL HIGH (ref 0.44–1.00)
GFR, Estimated: 47 mL/min — ABNORMAL LOW (ref >60)
Glucose, Bld: 163 mg/dL — ABNORMAL HIGH (ref 70–99)
Potassium: 3 mmol/L — ABNORMAL LOW (ref 3.5–5.1)
Sodium: 128 mmol/L — ABNORMAL LOW (ref 135–145)
Total Bilirubin: 0.6 mg/dL (ref 0.3–1.2)
Total Protein: 7.3 g/dL (ref 6.5–8.1)

## 2020-06-26 LAB — I-STAT VENOUS BLOOD GAS, ED
Acid-Base Excess: 6 mmol/L — ABNORMAL HIGH (ref 0.0–2.0)
Bicarbonate: 32.8 mmol/L — ABNORMAL HIGH (ref 20.0–28.0)
Calcium, Ion: 1.09 mmol/L — ABNORMAL LOW (ref 1.15–1.40)
HCT: 38 % (ref 36.0–46.0)
Hemoglobin: 12.9 g/dL (ref 12.0–15.0)
O2 Saturation: 61 %
Potassium: 3.3 mmol/L — ABNORMAL LOW (ref 3.5–5.1)
Sodium: 129 mmol/L — ABNORMAL LOW (ref 135–145)
TCO2: 34 mmol/L — ABNORMAL HIGH (ref 22–32)
pCO2, Ven: 56.3 mmHg (ref 44.0–60.0)
pH, Ven: 7.373 (ref 7.250–7.430)
pO2, Ven: 33 mmHg (ref 32.0–45.0)

## 2020-06-26 LAB — CBC WITH DIFFERENTIAL/PLATELET
Abs Immature Granulocytes: 0.02 10*3/uL (ref 0.00–0.07)
Basophils Absolute: 0 10*3/uL (ref 0.0–0.1)
Basophils Relative: 0 %
Eosinophils Absolute: 0 10*3/uL (ref 0.0–0.5)
Eosinophils Relative: 1 %
HCT: 37.9 % (ref 36.0–46.0)
Hemoglobin: 12.7 g/dL (ref 12.0–15.0)
Immature Granulocytes: 1 %
Lymphocytes Relative: 18 %
Lymphs Abs: 0.8 10*3/uL (ref 0.7–4.0)
MCH: 27 pg (ref 26.0–34.0)
MCHC: 33.5 g/dL (ref 30.0–36.0)
MCV: 80.6 fL (ref 80.0–100.0)
Monocytes Absolute: 0.3 10*3/uL (ref 0.1–1.0)
Monocytes Relative: 8 %
Neutro Abs: 3.1 10*3/uL (ref 1.7–7.7)
Neutrophils Relative %: 72 %
Platelets: 310 10*3/uL (ref 150–400)
RBC: 4.7 MIL/uL (ref 3.87–5.11)
RDW: 14 % (ref 11.5–15.5)
WBC: 4.2 10*3/uL (ref 4.0–10.5)
nRBC: 0 % (ref 0.0–0.2)

## 2020-06-26 LAB — D-DIMER, QUANTITATIVE: D-Dimer, Quant: 1.92 ug/mL-FEU — ABNORMAL HIGH (ref 0.00–0.50)

## 2020-06-26 LAB — FIBRINOGEN: Fibrinogen: 642 mg/dL — ABNORMAL HIGH (ref 210–475)

## 2020-06-26 LAB — GLUCOSE, CAPILLARY
Glucose-Capillary: 175 mg/dL — ABNORMAL HIGH (ref 70–99)
Glucose-Capillary: 223 mg/dL — ABNORMAL HIGH (ref 70–99)

## 2020-06-26 LAB — RESP PANEL BY RT-PCR (FLU A&B, COVID) ARPGX2
Influenza A by PCR: NEGATIVE
Influenza B by PCR: NEGATIVE
SARS Coronavirus 2 by RT PCR: POSITIVE — AB

## 2020-06-26 LAB — CBG MONITORING, ED
Glucose-Capillary: 107 mg/dL — ABNORMAL HIGH (ref 70–99)
Glucose-Capillary: 171 mg/dL — ABNORMAL HIGH (ref 70–99)

## 2020-06-26 LAB — BRAIN NATRIURETIC PEPTIDE: B Natriuretic Peptide: 68.9 pg/mL (ref 0.0–100.0)

## 2020-06-26 LAB — LACTATE DEHYDROGENASE: LDH: 193 U/L — ABNORMAL HIGH (ref 98–192)

## 2020-06-26 LAB — LACTIC ACID, PLASMA: Lactic Acid, Venous: 1.4 mmol/L (ref 0.5–1.9)

## 2020-06-26 LAB — C-REACTIVE PROTEIN: CRP: 3.9 mg/dL — ABNORMAL HIGH (ref <1.0)

## 2020-06-26 LAB — FERRITIN: Ferritin: 104 ng/mL (ref 11–307)

## 2020-06-26 LAB — TRIGLYCERIDES: Triglycerides: 57 mg/dL (ref <150)

## 2020-06-26 LAB — MAGNESIUM: Magnesium: 1.9 mg/dL (ref 1.7–2.4)

## 2020-06-26 LAB — PROCALCITONIN: Procalcitonin: <0.1 ng/mL

## 2020-06-26 LAB — MRSA NEXT GEN BY PCR, NASAL: MRSA by PCR Next Gen: NOT DETECTED

## 2020-06-26 MED ORDER — ZINC SULFATE 220 (50 ZN) MG PO CAPS
220.0000 mg | ORAL_CAPSULE | Freq: Every day | ORAL | Status: DC
  Administered 2020-06-26 – 2020-06-30 (×5): 220 mg via ORAL
  Filled 2020-06-26 (×5): qty 1

## 2020-06-26 MED ORDER — SODIUM CHLORIDE 0.9 % IV SOLN
200.0000 mg | Freq: Once | INTRAVENOUS | Status: AC
  Administered 2020-06-26: 200 mg via INTRAVENOUS
  Filled 2020-06-26: qty 40

## 2020-06-26 MED ORDER — CALCIUM CARBONATE 1250 (500 CA) MG PO TABS
500.0000 mg | ORAL_TABLET | Freq: Two times a day (BID) | ORAL | Status: DC
  Administered 2020-06-26 – 2020-06-30 (×9): 500 mg via ORAL
  Filled 2020-06-26 (×10): qty 1

## 2020-06-26 MED ORDER — ASCORBIC ACID 500 MG PO TABS
500.0000 mg | ORAL_TABLET | Freq: Every day | ORAL | Status: DC
  Administered 2020-06-26 – 2020-06-30 (×5): 500 mg via ORAL
  Filled 2020-06-26 (×5): qty 1

## 2020-06-26 MED ORDER — ACETAMINOPHEN 325 MG PO TABS
650.0000 mg | ORAL_TABLET | Freq: Four times a day (QID) | ORAL | Status: DC | PRN
  Administered 2020-06-29: 650 mg via ORAL
  Filled 2020-06-26: qty 2

## 2020-06-26 MED ORDER — IOHEXOL 350 MG/ML SOLN: 100.0000 mL | Freq: Once | INTRAVENOUS | Status: DC

## 2020-06-26 MED ORDER — IOHEXOL 350 MG/ML SOLN
100.0000 mL | Freq: Once | INTRAVENOUS | Status: AC
  Administered 2020-06-26: 55 mL via INTRAVENOUS

## 2020-06-26 MED ORDER — GABAPENTIN 100 MG PO CAPS
100.0000 mg | ORAL_CAPSULE | Freq: Every day | ORAL | Status: DC
  Administered 2020-06-26 – 2020-06-29 (×4): 100 mg via ORAL
  Filled 2020-06-26 (×4): qty 1

## 2020-06-26 MED ORDER — BENAZEPRIL HCL 20 MG PO TABS
20.0000 mg | ORAL_TABLET | Freq: Every day | ORAL | Status: DC
  Administered 2020-06-26 – 2020-06-28 (×3): 20 mg via ORAL
  Filled 2020-06-26 (×4): qty 1

## 2020-06-26 MED ORDER — INSULIN ASPART 100 UNIT/ML IJ SOLN
0.0000 [IU] | Freq: Every day | INTRAMUSCULAR | Status: DC
  Administered 2020-06-26: 2 [IU] via SUBCUTANEOUS
  Administered 2020-06-27 – 2020-06-28 (×2): 3 [IU] via SUBCUTANEOUS

## 2020-06-26 MED ORDER — PREDNISONE 5 MG PO TABS: 50.0000 mg | ORAL_TABLET | Freq: Every day | ORAL | Status: DC

## 2020-06-26 MED ORDER — SIMVASTATIN 20 MG PO TABS
10.0000 mg | ORAL_TABLET | Freq: Every day | ORAL | Status: DC
  Administered 2020-06-26 – 2020-06-29 (×4): 10 mg via ORAL
  Filled 2020-06-26 (×4): qty 1

## 2020-06-26 MED ORDER — PANTOPRAZOLE SODIUM 40 MG PO TBEC
40.0000 mg | DELAYED_RELEASE_TABLET | Freq: Every day | ORAL | Status: DC
  Administered 2020-06-26 – 2020-06-30 (×5): 40 mg via ORAL
  Filled 2020-06-26 (×5): qty 1

## 2020-06-26 MED ORDER — SODIUM CHLORIDE 0.9 % IV SOLN
100.0000 mg | Freq: Every day | INTRAVENOUS | Status: AC
  Administered 2020-06-27 – 2020-06-30 (×4): 100 mg via INTRAVENOUS
  Filled 2020-06-26 (×4): qty 20

## 2020-06-26 MED ORDER — METHIMAZOLE 5 MG PO TABS
2.5000 mg | ORAL_TABLET | Freq: Every day | ORAL | Status: DC
  Administered 2020-06-26 – 2020-06-30 (×5): 2.5 mg via ORAL
  Filled 2020-06-26 (×5): qty 1

## 2020-06-26 MED ORDER — SODIUM CHLORIDE 0.9% FLUSH
3.0000 mL | Freq: Two times a day (BID) | INTRAVENOUS | Status: DC
  Administered 2020-06-26 – 2020-06-30 (×7): 3 mL via INTRAVENOUS

## 2020-06-26 MED ORDER — SODIUM CHLORIDE 0.9 % IV BOLUS
500.0000 mL | Freq: Once | INTRAVENOUS | Status: AC
  Administered 2020-06-26: 500 mL via INTRAVENOUS

## 2020-06-26 MED ORDER — POTASSIUM CHLORIDE CRYS ER 20 MEQ PO TBCR
60.0000 meq | EXTENDED_RELEASE_TABLET | ORAL | Status: AC
  Administered 2020-06-26: 60 meq via ORAL
  Filled 2020-06-26: qty 3

## 2020-06-26 MED ORDER — ATENOLOL 50 MG PO TABS
50.0000 mg | ORAL_TABLET | Freq: Two times a day (BID) | ORAL | Status: DC
  Administered 2020-06-26 – 2020-06-27 (×4): 50 mg via ORAL
  Filled 2020-06-26: qty 2
  Filled 2020-06-26 (×3): qty 1

## 2020-06-26 MED ORDER — INSULIN ASPART 100 UNIT/ML IJ SOLN
0.0000 [IU] | Freq: Three times a day (TID) | INTRAMUSCULAR | Status: DC
  Administered 2020-06-26 – 2020-06-27 (×3): 3 [IU] via SUBCUTANEOUS
  Administered 2020-06-27: 2 [IU] via SUBCUTANEOUS
  Administered 2020-06-27 – 2020-06-28 (×3): 3 [IU] via SUBCUTANEOUS
  Administered 2020-06-28: 5 [IU] via SUBCUTANEOUS
  Administered 2020-06-29 (×2): 2 [IU] via SUBCUTANEOUS
  Administered 2020-06-30: 3 [IU] via SUBCUTANEOUS

## 2020-06-26 MED ORDER — ALBUTEROL SULFATE HFA 108 (90 BASE) MCG/ACT IN AERS
2.0000 | INHALATION_SPRAY | Freq: Four times a day (QID) | RESPIRATORY_TRACT | Status: DC
  Administered 2020-06-26 – 2020-06-27 (×3): 2 via RESPIRATORY_TRACT
  Filled 2020-06-26 (×2): qty 6.7

## 2020-06-26 MED ORDER — HYDROCOD POLST-CPM POLST ER 10-8 MG/5ML PO SUER
5.0000 mL | Freq: Two times a day (BID) | ORAL | Status: DC | PRN
  Administered 2020-06-27 – 2020-06-28 (×2): 5 mL via ORAL
  Filled 2020-06-26 (×2): qty 5

## 2020-06-26 MED ORDER — GUAIFENESIN-DM 100-10 MG/5ML PO SYRP
10.0000 mL | ORAL_SOLUTION | ORAL | Status: DC | PRN
  Administered 2020-06-26 – 2020-06-29 (×2): 10 mL via ORAL
  Filled 2020-06-26 (×2): qty 10

## 2020-06-26 MED ORDER — DICLOFENAC SODIUM 1 % EX GEL: 4.0000 g | Freq: Four times a day (QID) | CUTANEOUS | Status: DC | PRN

## 2020-06-26 MED ORDER — AMLODIPINE BESYLATE 10 MG PO TABS
10.0000 mg | ORAL_TABLET | Freq: Every day | ORAL | Status: DC
  Administered 2020-06-26 – 2020-06-30 (×5): 10 mg via ORAL
  Filled 2020-06-26: qty 2
  Filled 2020-06-26 (×4): qty 1

## 2020-06-26 MED ORDER — VITAMIN B-12 1000 MCG PO TABS
1000.0000 ug | ORAL_TABLET | Freq: Every day | ORAL | Status: DC
  Administered 2020-06-26 – 2020-06-30 (×5): 1000 ug via ORAL
  Filled 2020-06-26 (×5): qty 1

## 2020-06-26 MED ORDER — METHYLPREDNISOLONE SODIUM SUCC 40 MG IJ SOLR: 0.5000 mg/kg | Freq: Two times a day (BID) | INTRAMUSCULAR | Status: DC

## 2020-06-26 MED ORDER — DEXAMETHASONE SODIUM PHOSPHATE 10 MG/ML IJ SOLN
10.0000 mg | Freq: Once | INTRAMUSCULAR | Status: AC
  Administered 2020-06-26: 10 mg via INTRAVENOUS
  Filled 2020-06-26: qty 1

## 2020-06-26 MED ORDER — ENOXAPARIN SODIUM 40 MG/0.4ML IJ SOSY
40.0000 mg | PREFILLED_SYRINGE | INTRAMUSCULAR | Status: DC
  Administered 2020-06-26 – 2020-06-30 (×5): 40 mg via SUBCUTANEOUS
  Filled 2020-06-26 (×5): qty 0.4

## 2020-06-26 NOTE — H&P (Signed)
History and Physical    [name] LXB:[id number] DOB: Apr 06, 1940 DOA: 06/26/2020  Referring MD/NP/PA: Ezequiel Essex, MD PCP: [name], MD  Patient coming from: Home via EMS  Chief Complaint: Weakness  I have personally briefly reviewed patient's old medical records in [place]   HPI: Mandy [name] is a 80 y.o. [place] speaker female with medical history significant of hypertension, hyperlipidemia, diabetes mellitus type 2, hypothyroidism, and SBO presents with complaints of weakness.  Patient's daughter is present at bedside and acts interpreter for the patient.  She started having a mild intermittent cough approximately 6 days ago.  Patient tested positive 4 days ago with an at home test for COVID-19 after her daughter had tested positive the day before.  She  initially was having fevers up to 101 F at home were treated with Tylenol.  Her cough has intermittently been productive and she complains of sore throat.  Her daughter was giving vitamins and over-the-counter cough medicine.  At baseline patient is not on oxygen and walks with use of a walker.  Denies any change in taste, diarrhea, headache, chest pain, abdominal pain, or muscle aches.  Yesterday, was having increasing weakness with difficulty walking and complaints of dizziness described as the room spinning.  She has been taking all of her medicines as prescribed.  Overnight patient was noted not to be doing well although her oxygenation was reported to be stable.  She did have 1 episode of vomiting this morning.  EMS was called due to her symptoms and brought her to the hospital.  She just underwent left shoulder arthroplasty on 6/18.  Patient had previously had COVID-19 in 01/2019.  She reports that she received her initial COVID-vaccine, but had not received a booster.  ED Course: Upon admission into the emergency department patient was seen to be afebrile, blood pressures 164/71-188/83, O2 saturations as low as  84% with improvement greater than 92% on 5 L, and all other vital signs maintained.  Labs significant for CBC within normal limits, sodium 128, potassium 3, chloride 86, CO2 31, BUN 19, creatinine 1.17, glucose 163, D-dimer 1.92, fibrinogen 642, lactic acid 1.4, LDH 193.   Chest x-ray significant for interval development of a mild bibasilar atelectasis or infiltrate.  A blood culture was obtained. COVID-19 screening was positive.  Patient has been given 500 mL of normal saline IV fluids, Decadron 10 mg IV, and remdesivir.  The ED provider had ordered a CT of the brain and CT angiogram of the chest given elevated D-dimer.  TRH called to admit.  Review of Systems  Constitutional:  Positive for fever and malaise/fatigue.  HENT:  Positive for sore throat. Negative for hearing loss.   Eyes:  Negative for double vision and photophobia.  Respiratory:  Positive for cough, sputum production and shortness of breath.   Cardiovascular:  Negative for chest pain and leg swelling.  Gastrointestinal:  Positive for nausea and vomiting. Negative for diarrhea.  Genitourinary:  Negative for dysuria and hematuria.  Musculoskeletal:  Negative for falls.  Skin:  Negative for rash.  Neurological:  Positive for dizziness. Negative for loss of consciousness and headaches.  Psychiatric/Behavioral:  Negative for substance abuse.    Past Medical History:  Diagnosis Date   Aortic atherosclerosis ([place]) 10/08/2018   Diabetes mellitus without complication ([place])    Hepatic steatosis 10/08/2018   Hyperlipidemia    Hypertension    Hypothyroidism    Mass of right ovary 10/08/2018   Osteoporosis    SBO (  small bowel obstruction) ([place]) 08/09/2018   SBO (small bowel obstruction) ([place]) 08/09/2018    Past Surgical History:  Procedure Laterality Date   CATARACT EXTRACTION W/ INTRAOCULAR LENS  IMPLANT, BILATERAL     COLONOSCOPY       reports that she has never smoked. She has never used smokeless tobacco. She reports that she  does not drink alcohol and does not use drugs.  No Known Allergies  Family History  Problem Relation Age of Onset   Kidney disease Son    Colon cancer Neg Hx    Breast cancer Neg Hx     Prior to Admission medications   Medication Sig Start Date End Date Taking? Authorizing Provider  alendronate (FOSAMAX) 70 MG tablet Take 70 mg by mouth every Monday. Take with a full glass of water on an empty stomach.   Yes [provider]  amLODipine (NORVASC) 10 MG tablet Take 10 mg by mouth daily.   Yes [provider]  atenolol (TENORMIN) 50 MG tablet Take 50 mg by mouth 2 (two) times daily.   Yes [provider]  benazepril-hydrochlorthiazide (LOTENSIN HCT) 20-25 MG tablet Take 1 tablet by mouth daily. 01/08/19  Yes [provider]  calcium carbonate (OSCAL) 1500 (600 Ca) MG TABS tablet Take 600 mg of elemental calcium by mouth 2 (two) times daily with a meal.   Yes [provider]  diclofenac sodium (VOLTAREN) 1 % GEL Apply 4 g topically 4 (four) times daily as needed for pain. 09/13/18  Yes [provider]  gabapentin (NEURONTIN) 100 MG capsule Take 100 mg by mouth at bedtime. 07/18/18  Yes [provider]  guaiFENesin-dextromethorphan (ROBITUSSIN DM) 100-10 MG/5ML syrup Take 10 mLs by mouth every 4 (four) hours as needed for cough. 02/04/19  Yes Hosie Poisson, MD  metFORMIN (GLUCOPHAGE) 500 MG tablet Take 500 mg by mouth 2 (two) times daily with a meal.    Yes [provider]  methimazole (TAPAZOLE) 5 MG tablet Take 0.5 tablets (2.5 mg total) by mouth daily. 02/04/19  Yes Hosie Poisson, MD  Multiple Vitamin (MULTIVITAMIN WITH MINERALS) TABS tablet Take 1 tablet by mouth daily.   Yes [provider]  Phenyleph-Diphenhyd-DM-APAP (THERAFLU SEVERE COLD & COUGH PO) Take 1 packet by mouth every 6 (six) hours as needed (cold/cough).   Yes [provider]  simvastatin (ZOCOR) 10 MG tablet Take 10 mg by mouth at bedtime. 04/08/18   Yes [provider]  tiZANidine (ZANAFLEX) 4 MG tablet Take 4 mg by mouth 2 (two) times daily.  07/19/18  Yes [provider]  vitamin B-12 (CYANOCOBALAMIN) 1000 MCG tablet Take 1,000 mcg by mouth daily.   Yes [provider]  vitamin C (ASCORBIC ACID) 500 MG tablet Take 500 mg by mouth daily.   Yes [provider]  Vitamin D, Cholecalciferol, 25 MCG (1000 UT) TABS Take 1,000 Units by mouth daily.   Yes [provider]  zinc gluconate 50 MG tablet Take 50 mg by mouth daily.   Yes [provider]  albuterol (VENTOLIN HFA) 108 (90 Base) MCG/ACT inhaler Inhale 2 puffs into the lungs every 6 (six) hours as needed for wheezing or shortness of breath. Patient not taking: No sig reported 02/04/19   Hosie Poisson, MD  aspirin EC 81 MG tablet Take 81 mg by mouth daily. Swallow whole. Patient not taking: No sig reported    [provider]  furosemide (LASIX) 20 MG tablet Take 1 tablet (20 mg total) by  mouth daily. Patient not taking: No sig reported 02/04/19 04/05/19  Hosie Poisson, MD  pantoprazole (PROTONIX) 40 MG tablet Take 1 tablet (40 mg total) by mouth daily at 6 (six) AM. Patient not taking: No sig reported 02/05/19   Hosie Poisson, MD  zinc sulfate 220 (50 Zn) MG capsule Take 1 capsule (220 mg total) by mouth daily. Patient not taking: No sig reported 02/05/19   Hosie Poisson, MD    Physical Exam:  Constitutional: Elderly female who appears to be acutely ill and coughing Vitals:   06/26/20 0500 06/26/20 0523 06/26/20 0545 06/26/20 0650  BP: (!) 176/87  (!) 188/83 (!) 164/71  Pulse: 61  (!) 59 63  Resp: 12  16 16   Temp:      TempSrc:      SpO2: 100%  100% 100%  Height:  5\' 4"  (1.626 m)     Eyes: PERRL, lids and conjunctivae normal ENMT: Mucous membranes are moist. Posterior pharynx clear of any exudate or lesions.  Neck: normal, supple, no masses, no thyromegaly Respiratory: Normal respiratory effort with decreased overall aeration.   Intermittent rales and crackles noted near the bases.  Patient currently maintaining O2 saturations on 5 L nasal cannula oxygen Cardiovascular: Regular rate and rhythm, no murmurs / rubs / gallops. No extremity edema. 2+ pedal pulses. No carotid bruits.  Abdomen: no tenderness, no masses palpated. No hepatosplenomegaly. Bowel sounds positive.  Musculoskeletal: no clubbing / cyanosis. No joint deformity upper and lower extremities. Good ROM, no contractures. Normal muscle tone.  Skin: no rashes, lesions, ulcers. No induration Neurologic: CN 2-12 grossly intact. Sensation intact, DTR normal. Strength 5/5 in all 4.  Psychiatric: Normal judgment and insight. Alert and oriented x 3. Normal mood.     Labs on Admission: I have personally reviewed following labs and imaging studies  CBC: Recent Labs  Lab 06/26/20 0401  WBC 4.2  NEUTROABS 3.1  HGB 12.7  HCT 37.9  MCV 80.6  PLT 903   Basic Metabolic Panel: Recent Labs  Lab 06/26/20 0401  NA 128*  K 3.0*  CL 86*  CO2 31  GLUCOSE 163*  BUN 19  CREATININE 1.17*  CALCIUM 9.2   GFR: Estimated Creatinine Clearance: 39.8 mL/min (A) (by C-G formula based on SCr of 1.17 mg/dL (H)). Liver Function Tests: Recent Labs  Lab 06/26/20 0401  AST 24  ALT 12  ALKPHOS 47  BILITOT 0.6  PROT 7.3  ALBUMIN 3.5   No results for input(s): LIPASE, AMYLASE in the last 168 hours. No results for input(s): AMMONIA in the last 168 hours. Coagulation Profile: No results for input(s): INR, PROTIME in the last 168 hours. Cardiac Enzymes: No results for input(s): CKTOTAL, CKMB, CKMBINDEX, TROPONINI in the last 168 hours. BNP (last 3 results) No results for input(s): PROBNP in the last 8760 hours. HbA1C: No results for input(s): HGBA1C in the last 72 hours. CBG: No results for input(s): GLUCAP in the last 168 hours. Lipid Profile: Recent Labs    06/26/20 0420  TRIG 57   Thyroid Function Tests: No results for input(s): TSH, T4TOTAL, FREET4,  T3FREE, THYROIDAB in the last 72 hours. Anemia Panel: No results for input(s): VITAMINB12, FOLATE, FERRITIN, TIBC, IRON, RETICCTPCT in the last 72 hours. Urine analysis:    Component Value Date/Time   COLORURINE YELLOW 10/08/2018 [place] 10/08/2018 0943   LABSPEC 1.011 10/08/2018 [place] 7.0 10/08/2018 [place] 10/08/2018 [place] 10/08/2018 0092  [place] NEGATIVE 10/08/2018 [place] 10/08/2018 [place] 10/08/2018 0943   NITRITE NEGATIVE 10/08/2018 0943   LEUKOCYTESUR NEGATIVE 10/08/2018 0943   Sepsis Labs: Recent Results (from the past 240 hour(s))  Surgical pcr screen     Status: None   Collection Time: 06/16/20  2:17 PM   Specimen: Nasal Mucosa; Nasal Swab  Result Value Ref Range Status   MRSA, PCR NEGATIVE NEGATIVE Final   Staphylococcus aureus NEGATIVE NEGATIVE Final    Comment: (NOTE) The Xpert SA Assay (FDA approved for NASAL specimens in patients 34 years of age and older), is one component of a comprehensive surveillance program. It is not intended to diagnose infection nor to guide or monitor treatment. Performed at [facility], [place] [address], [place], [place] [address]   Resp Panel by RT-PCR (Flu A&B, Covid) Nasopharyngeal Swab     Status: Abnormal   Collection Time: 06/26/20  4:01 AM   Specimen: Nasopharyngeal Swab; Nasopharyngeal(NP) swabs in vial transport medium  Result Value Ref Range Status   SARS Coronavirus 2 by RT PCR POSITIVE (A) NEGATIVE Final    Comment: RESULT CALLED TO, READ BACK BY AND VERIFIED WITH: RN S WIRTZ [id number] QQ 761 AM BY CM (NOTE) SARS-CoV-2 target nucleic acids are DETECTED.  The SARS-CoV-2 RNA is generally detectable in upper respiratory specimens during the acute phase of infection. Positive results are indicative of the presence of the identified virus, but do not rule out bacterial infection or co-infection with other  pathogens not detected by the test. Clinical correlation with patient history and other diagnostic information is necessary to determine patient infection status. The expected result is Negative.  Fact Sheet for Patients: EntrepreneurPulse.com.au  Fact Sheet for Healthcare Providers: IncredibleEmployment.be  This test is not yet approved or cleared by the [place] FDA and  has been authorized for detection and/or diagnosis of SARS-CoV-2 by FDA under an Emergency Use Authorization (EUA).  This EUA will remain in effect (meaning this test can be  used) for the duration of  the COVID-19 declaration under Section 564(b)(1) of the Act, 21 U.S.C. section 360bbb-3(b)(1), unless the authorization is terminated or revoked sooner.     Influenza A by PCR NEGATIVE NEGATIVE Final   Influenza B by PCR NEGATIVE NEGATIVE Final    Comment: (NOTE) The Xpert Xpress SARS-CoV-2/FLU/RSV plus assay is intended as an aid in the diagnosis of influenza from Nasopharyngeal swab specimens and should not be used as a sole basis for treatment. Nasal washings and aspirates are unacceptable for Xpert Xpress SARS-CoV-2/FLU/RSV testing.  Fact Sheet for Patients: EntrepreneurPulse.com.au  Fact Sheet for Healthcare Providers: IncredibleEmployment.be  This test is not yet approved or cleared by the [place] FDA and has been authorized for detection and/or diagnosis of SARS-CoV-2 by FDA under an Emergency Use Authorization (EUA). This EUA will remain in effect (meaning this test can be used) for the duration of the COVID-19 declaration under Section 564(b)(1) of the Act, 21 U.S.C. section 360bbb-3(b)(1), unless the authorization is terminated or revoked.  Performed at [facility], [place] [address] [place], [place] [address]      Radiological Exams on Admission: DG Chest Port 1 View  Result Date: 06/26/2020 CLINICAL  DATA:  COVID pneumonia EXAM: PORTABLE CHEST 1 VIEW COMPARISON:  01/31/2019 FINDINGS: Mild bibasilar atelectasis or infiltrate. No pneumothorax. Possible small left pleural effusion. Mild cardiomegaly is stable. Pulmonary vascularity is normal. IMPRESSION: Interval development of mild bibasilar atelectasis or infiltrate.  Possible small left pleural effusion. Stable cardiomegaly. Electronically Signed   By: Fidela Salisbury MD   On: 06/26/2020 04:39    EKG: Independently reviewed.  Sinus rhythm at 65 bpm with QTC 490  Assessment/Plan Acute respiratory failure with hypoxia secondary to COVID-19: Patient presented with complaints of weakness and dizziness.  Found to be hypoxic down to 84% requiring 5 L of nasal cannula oxygen to maintain O2 saturations greater than 92%.  Patient had tested positive for COVID-19 on 6/20.  Patient previously positive for COVID-19 in May 2021.  Chest x-ray showing bibasilar atelectasis versus infiltrate.  On physical exam patient with rales and bibasilar crackles.  Suspect likely atelectasis.  Due to elevated D-dimer 1.92 a  CT angiogram of the chest was ordered. 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  Seen on CT angiogram of the chest measuring 8 cm x 6.5 cm x 2.4 cm. -IR consulted for possible biopsy  Hypertensive urgency: Acute.  On admission systolic blood pressures elevated up to 180 Home blood pressure medications include amlodipine 10 mg daily, atenolol 50 mg twice daily, and benazepril-hydrochlorothiazide 20-25 mg daily. -Held hydrochlorothiazide as patient appears to be fluid depleted -Continue amlodipine, atenolol  and benazepril  Hypokalemia: Acute.  Potassium 3 on admission. -Give 60 mEq p.o. x1 dose -Check magnesium level -Continue to monitor and replace as needed  Generalized weakness/dizziness: Acute.  At baseline patient ambulates with the use of a walker.  However, noted to be significantly weak and complaining of dizziness.  Suspect secondary to COVID-19 infection.  CT scan of the brain negative for any acute abnormalities. -PT to eval and treat  Hyponatremia: Acute on chronic: Sodium 128 on admission.  Patient had been given 500 mL of normal saline IV fluids. -Avoiding aggressive IV fluid rehydration -Continue to monitor  Diabetes mellitus type 2, well controlled: On admission patient's glucose elevated at 163.  Last hemoglobin A1c was 6.7 on 06/16/2020.  Home medications include metformin 500 mg twice daily. -Hypoglycemic protocols -Hold metformin -CBGs before every meal and at bedtime with moderate SSI -Adjust insulin regimen as needed  Diastolic congestive heart failure: chronic Patient appears to be euvolemic at this time.  Last EF noted to be 50-55% with grade 1 diastolic dysfunction by echocardiogram in 02/03/2019. -Strict I&O's and daily weights -Follow-up BNP  Hyperthyroidism: Free T4 was 1.3 and TSH 1.17 on 4/7.  Home medications include methimazole 2.5 mg daily. -Continue methimazole  Hyperlipidemia -Continue simvastatin  GERD: Patient was not on any medications for treatment at home. -Protonix 40 mg daily for GI prophylaxis given patient being on steroids  Obesity: BMI 37.2 kg/m  DVT prophylaxis: Lovenox Code Status: Full Family Communication: Daughter updated at bedside Disposition Plan: Likely home once medically stable Consults called: None Admission status: Inpatient, require more than 2 midnight stay due to and new oxygen requirement in the setting of COVID  Norval Morton MD [facility]   If 7PM-7AM, please contact night-coverage   06/26/2020, 7:08  AM

## 2020-06-26 NOTE — Progress Notes (Signed)
80 y.o. [place]  female inpatient. History of HTN, HLD, DM, SBO. Presented to the ED with intermittent cough X 6 days , fevers, SHOB, nausea and vomiting. COVID +. 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 Recommend CTS referral. Team is requesting a neck mass biopsy. Case reviewed and approved by Dr. Lucrezia Europe. Patient to be scheduled for procedure once off precaution. Procedure can be scheduled as outpatient. Team made aware. Daughter Burman [place] made aware via telephone

## 2020-06-26 NOTE — ED Provider Notes (Signed)
[facility] EMERGENCY DEPARTMENT Provider Note   CSN: [id number] Arrival date & time: 06/26/20  0350     History No chief complaint on file.   Mandy [name] is a 80 y.o. female.  Level 5 caveat for language barrier.  Patient with history of diabetes, hypertension, small bowel obstruction, previous COVID infection here from home with concern for COVID infection.  EMS reports tested positive at home yesterday.  EMS called up with increased weakness and nausea.  Hypoxic on room air to the mid 80s. Family apparently COVID-positive at home as well. No documented fever or chest pain. Patient complaining of nausea but no vomiting.  Patient's daughter has arrived.  She states the patient was diagnosed with COVID on June 20.  Today she had increased generalized weakness, difficulty walking and dizziness.  Dizziness is described as lightheadedness as well as room spinning.  Dizziness has been persistent for several days but worsening.  No nausea or vomiting.  No diarrhea or fever.  No chest pain or shortness of breath. Daughter reports O2 saturations were good at home until today.  The history is provided by the patient and the EMS personnel. The history is limited by the condition of the patient. A language interpreter was used.      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   Hyponatremia 35/36/1443   Acute metabolic encephalopathy 15/40/0867   COVID-19 virus infection 01/31/2019   Mass of right ovary 10/08/2018    Hepatic steatosis 10/08/2018   Aortic atherosclerosis ([place]) 10/08/2018   SBO (small bowel obstruction) ([place]) 08/09/2018   Diabetes mellitus type 2 in obese ([place]) 04/07/2008   Dyslipidemia 04/07/2008   Essential hypertension 04/07/2008   SORE THROAT 04/07/2008   GERD 04/07/2008    Past Surgical History:  Procedure Laterality Date   CATARACT EXTRACTION W/ INTRAOCULAR LENS  IMPLANT, BILATERAL     COLONOSCOPY       OB History   No obstetric history on file.     Family History  Problem Relation Age of Onset   Kidney disease Son    Colon cancer Neg Hx    Breast cancer Neg Hx     Social History   Tobacco Use   Smoking status: Never   Smokeless tobacco: Never  Vaping Use   Vaping Use: Never used  Substance Use Topics   Alcohol use: No   Drug use: No    Home Medications Prior to Admission medications   Medication Sig Start Date End Date Taking? Authorizing Provider  albuterol (VENTOLIN HFA) 108 (90 Base) MCG/ACT inhaler Inhale 2 puffs into the lungs every 6 (six) hours as needed for wheezing or shortness of breath. Patient not taking: No sig reported 02/04/19   Hosie Poisson, MD  alendronate (FOSAMAX) 70 MG tablet Take 70 mg by mouth once a week. Take with a full glass of water on an empty stomach.    [provider]  amLODipine (NORVASC) 10 MG tablet Take 10 mg by mouth daily.  [provider]  aspirin EC 81 MG tablet Take 81 mg by mouth daily. Swallow whole. Patient not taking: Reported on 06/09/2020    [provider]  atenolol (TENORMIN) 50 MG tablet Take 50 mg by mouth 2 (two) times daily.    [provider]  benazepril-hydrochlorthiazide (LOTENSIN HCT) 20-25 MG tablet Take 1 tablet by mouth daily. 01/08/19   [provider]  calcium carbonate (OSCAL) 1500 (600 Ca) MG TABS tablet Take 600 mg of elemental calcium by mouth 2 (two) times daily with a meal.    [provider]  diclofenac sodium (VOLTAREN) 1 % GEL Apply 4 g  topically 4 (four) times daily as needed for pain. 09/13/18   [provider]  furosemide (LASIX) 20 MG tablet Take 1 tablet (20 mg total) by mouth daily. Patient not taking: Reported on 06/09/2020 02/04/19 04/05/19  Hosie Poisson, MD  gabapentin (NEURONTIN) 100 MG capsule Take 100 mg by mouth at bedtime. 07/18/18   [provider]  guaiFENesin-dextromethorphan (ROBITUSSIN DM) 100-10 MG/5ML syrup Take 10 mLs by mouth every 4 (four) hours as needed for cough. 02/04/19   Hosie Poisson, MD  metFORMIN (GLUCOPHAGE) 500 MG tablet Take 500 mg by mouth 2 (two) times daily with a meal.     [provider]  methimazole (TAPAZOLE) 5 MG tablet Take 0.5 tablets (2.5 mg total) by mouth daily. 02/04/19   Hosie Poisson, MD  Multiple Vitamin (MULTIVITAMIN WITH MINERALS) TABS tablet Take 1 tablet by mouth daily.    [provider]  pantoprazole (PROTONIX) 40 MG tablet Take 1 tablet (40 mg total) by mouth daily at 6 (six) AM. Patient not taking: Reported on 06/09/2020 02/05/19   Hosie Poisson, MD  simvastatin (ZOCOR) 10 MG tablet Take 10 mg by mouth at bedtime. 04/08/18   [provider]  tiZANidine (ZANAFLEX) 4 MG tablet Take 4 mg by mouth 2 (two) times daily.  07/19/18   [provider]  zinc gluconate 50 MG tablet Take 50 mg by mouth daily.    [provider]  zinc sulfate 220 (50 Zn) MG capsule Take 1 capsule (220 mg total) by mouth daily. Patient not taking: Reported on 06/09/2020 02/05/19   Hosie Poisson, MD    Allergies    Patient has no known allergies.  Review of Systems   Review of Systems  Unable to perform ROS: Mental status change   Physical Exam Updated Vital Signs BP (!) 176/71 (BP Location: Right Arm)   Pulse 66   Temp 97.9 F (36.6 C) (Oral)   Resp 19   SpO2 100%   Physical Exam Vitals and nursing note reviewed.  Constitutional:      General: She is not in acute distress.    Appearance: She is well-developed.     Comments: Initial O2  saturations 83% on 4 L  HENT:     Head: Normocephalic and atraumatic.     Mouth/Throat:     Pharynx: No oropharyngeal exudate.  Eyes:     Conjunctiva/sclera: Conjunctivae normal.     Pupils: Pupils are equal, round, and reactive to light.  Neck:     Comments: No meningismus. Cardiovascular:     Rate and Rhythm: Normal rate and regular rhythm.     Heart sounds: Normal heart sounds. No murmur heard. Pulmonary:     Effort: Pulmonary effort is normal. No respiratory distress.     Breath sounds: Normal breath sounds.     Comments: No increased work of breathing,  diminished breath sounds bilaterally Abdominal:     Palpations: Abdomen is soft.     Tenderness: There is no abdominal tenderness. There is no guarding or rebound.  Musculoskeletal:        General: No tenderness. Normal range of motion.     Cervical back: Normal range of motion and neck supple.  Skin:    General: Skin is warm.  Neurological:     Mental Status: She is alert and oriented to person, place, and time.     Cranial Nerves: No cranial nerve deficit.     Motor: No abnormal muscle tone.     Coordination: Coordination normal.     Comments:  5/5 strength throughout. CN 2-12 intact.Equal grip strength.   Psychiatric:        Behavior: Behavior normal.    ED Results / Procedures / Treatments   Labs (all labs ordered are listed, but only abnormal results are displayed) Labs Reviewed  RESP PANEL BY RT-PCR (FLU A&B, COVID) ARPGX2 - Abnormal; Notable for the following components:      Result Value   SARS Coronavirus 2 by RT PCR POSITIVE (*)    All other components within normal limits  COMPREHENSIVE METABOLIC PANEL - Abnormal; Notable for the following components:   Sodium 128 (*)    Potassium 3.0 (*)    Chloride 86 (*)    Glucose, Bld 163 (*)    Creatinine, Ser 1.17 (*)    GFR, Estimated 47 (*)    All other components within normal limits  D-DIMER, QUANTITATIVE - Abnormal; Notable for the following components:    D-Dimer, Quant 1.92 (*)    All other components within normal limits  LACTATE DEHYDROGENASE - Abnormal; Notable for the following components:   LDH 193 (*)    All other components within normal limits  FIBRINOGEN - Abnormal; Notable for the following components:   Fibrinogen 642 (*)    All other components within normal limits  CBG MONITORING, ED - Abnormal; Notable for the following components:   Glucose-Capillary 107 (*)    All other components within normal limits  CULTURE, BLOOD (ROUTINE X 2)  CULTURE, BLOOD (ROUTINE X 2)  LACTIC ACID, PLASMA  CBC WITH DIFFERENTIAL/PLATELET  PROCALCITONIN  TRIGLYCERIDES  C-REACTIVE PROTEIN  FERRITIN  BRAIN NATRIURETIC PEPTIDE  MAGNESIUM  BLOOD GAS, VENOUS    EKG EKG Interpretation  Date/Time:  Friday June 26 2020 04:03:14 EDT Ventricular Rate:  65 PR Interval:  147 QRS Duration: 82 QT Interval:  471 QTC Calculation: 490 R Axis:   69 Text Interpretation: Sinus rhythm Borderline prolonged QT interval No significant change was found Confirmed by Ezequiel Essex [id number]) on 06/26/2020 4:11:39 AM  Radiology No results found.  Procedures .Critical Care  Date/Time: 06/26/2020 8:48 AM Performed by: Ezequiel Essex, MD Authorized by: Ezequiel Essex, MD   Critical care provider statement:    Critical care time (minutes):  45   Critical care was necessary to treat or prevent imminent or life-threatening deterioration of the following conditions:  Respiratory failure   Critical care was time spent personally by me on the following activities:  Discussions with consultants, evaluation of patient's response to treatment, examination of patient, ordering and performing treatments and interventions, ordering and review of laboratory studies, ordering and review of radiographic studies, pulse oximetry, re-evaluation of patient's condition, obtaining history from patient or surrogate and review of old charts   Medications Ordered in  ED Medications - No data to display  ED Course  I  have reviewed the triage vital signs and the nursing notes.  Pertinent labs & imaging results that were available during my care of the patient were reviewed by me and considered in my medical decision making (see chart for details).    MDM Rules/Calculators/A&P                         COVID + from home with increased weakness, shortness of breath and dizziness worsening over the past several days. New O2 requirement.  EKG without acute ischemia. CXR with new basilar infiltrates.  IV steroids, IV remdisivir.  With new O2 requirement and elevated d-dimer, will proceed with CTA chest.  Inflammatory markers mildly elevated. Mild hyponatremia and hypokalemia.   Will need admission for new hypoxic respiratory failure in setting of covid 19. CTA pending.  Admission d/w Dr. Tamala Julian.    Mandy [name] was evaluated in Emergency Department on 06/26/2020 for the symptoms described in the history of present illness. She was evaluated in the context of the global COVID-19 pandemic, which necessitated consideration that the patient might be at risk for infection with the SARS-CoV-2 virus that causes COVID-19. Institutional protocols and algorithms that pertain to the evaluation of patients at risk for COVID-19 are in a state of rapid change based on information released by regulatory bodies including the CDC and federal and state organizations. These policies and algorithms were followed during the patient's care in the ED.  Final Clinical Impression(s) / ED Diagnoses Final diagnoses:  None    Rx / DC Orders ED Discharge Orders     None        Jalie Eiland, Annie Main, MD 06/26/20 [phone]

## 2020-06-26 NOTE — ED Triage Notes (Addendum)
Pt BIB EMS from home, here for covid symptom exacerbation. Tested positive on home test, c/o increased weakness and vertigo as of this morning. Pt not english-speaking  EMS vitals: BP 172/90 Pulse 77 RR 20 88% RA, 98% 2L [place] 97.1 temp

## 2020-06-26 NOTE — ED Notes (Signed)
Patient transported to CT 

## 2020-06-26 NOTE — ED Notes (Signed)
Attempted to call report to the floor x1.

## 2020-06-26 NOTE — Progress Notes (Signed)
Patient was admitted to [phone]. Daughter at bedside and is translating for the patient. Patient has no complaints of pain, VS stable, skin intact, satting 91% on RA. No belongings at bedside at time of admission, with the exception of meds which are to be sent home with her daughter. Have instructed the patient on how to use her call bell and remote.

## 2020-06-27 DIAGNOSIS — J9601 Acute respiratory failure with hypoxia: Secondary | ICD-10-CM

## 2020-06-27 LAB — GLUCOSE, CAPILLARY
Glucose-Capillary: 134 mg/dL — ABNORMAL HIGH (ref 70–99)
Glucose-Capillary: 173 mg/dL — ABNORMAL HIGH (ref 70–99)
Glucose-Capillary: 176 mg/dL — ABNORMAL HIGH (ref 70–99)
Glucose-Capillary: 255 mg/dL — ABNORMAL HIGH (ref 70–99)

## 2020-06-27 LAB — COMPREHENSIVE METABOLIC PANEL
ALT: 13 U/L (ref 0–44)
AST: 25 U/L (ref 15–41)
Albumin: 3.1 g/dL — ABNORMAL LOW (ref 3.5–5.0)
Alkaline Phosphatase: 49 U/L (ref 38–126)
Anion gap: 8 (ref 5–15)
BUN: 20 mg/dL (ref 8–23)
CO2: 29 mmol/L (ref 22–32)
Calcium: 9.3 mg/dL (ref 8.9–10.3)
Chloride: 91 mmol/L — ABNORMAL LOW (ref 98–111)
Creatinine, Ser: 1.17 mg/dL — ABNORMAL HIGH (ref 0.44–1.00)
GFR, Estimated: 47 mL/min — ABNORMAL LOW (ref >60)
Glucose, Bld: 150 mg/dL — ABNORMAL HIGH (ref 70–99)
Potassium: 4 mmol/L (ref 3.5–5.1)
Sodium: 128 mmol/L — ABNORMAL LOW (ref 135–145)
Total Bilirubin: 0.2 mg/dL — ABNORMAL LOW (ref 0.3–1.2)
Total Protein: 7 g/dL (ref 6.5–8.1)

## 2020-06-27 LAB — CBC WITH DIFFERENTIAL/PLATELET
Abs Immature Granulocytes: 0.02 10*3/uL (ref 0.00–0.07)
Basophils Absolute: 0 10*3/uL (ref 0.0–0.1)
Basophils Relative: 0 %
Eosinophils Absolute: 0 10*3/uL (ref 0.0–0.5)
Eosinophils Relative: 0 %
HCT: 37 % (ref 36.0–46.0)
Hemoglobin: 12.6 g/dL (ref 12.0–15.0)
Immature Granulocytes: 1 %
Lymphocytes Relative: 28 %
Lymphs Abs: 1 10*3/uL (ref 0.7–4.0)
MCH: 27.3 pg (ref 26.0–34.0)
MCHC: 34.1 g/dL (ref 30.0–36.0)
MCV: 80.1 fL (ref 80.0–100.0)
Monocytes Absolute: 0.3 10*3/uL (ref 0.1–1.0)
Monocytes Relative: 9 %
Neutro Abs: 2.2 10*3/uL (ref 1.7–7.7)
Neutrophils Relative %: 62 %
Platelets: 283 10*3/uL (ref 150–400)
RBC: 4.62 MIL/uL (ref 3.87–5.11)
RDW: 14 % (ref 11.5–15.5)
WBC: 3.5 10*3/uL — ABNORMAL LOW (ref 4.0–10.5)
nRBC: 0 % (ref 0.0–0.2)

## 2020-06-27 LAB — URINALYSIS, ROUTINE W REFLEX MICROSCOPIC
Bilirubin Urine: NEGATIVE
Glucose, UA: NEGATIVE mg/dL
Hgb urine dipstick: NEGATIVE
Ketones, ur: NEGATIVE mg/dL
Leukocytes,Ua: NEGATIVE
Nitrite: NEGATIVE
Protein, ur: NEGATIVE mg/dL
Specific Gravity, Urine: 1.01 (ref 1.005–1.030)
pH: 6 (ref 5.0–8.0)

## 2020-06-27 LAB — SODIUM, URINE, RANDOM: Sodium, Ur: 17 mmol/L

## 2020-06-27 LAB — OSMOLALITY: Osmolality: 288 mOsm/kg (ref 275–295)

## 2020-06-27 LAB — D-DIMER, QUANTITATIVE: D-Dimer, Quant: 1.43 ug/mL-FEU — ABNORMAL HIGH (ref 0.00–0.50)

## 2020-06-27 LAB — OSMOLALITY, URINE: Osmolality, Ur: 282 mOsm/kg — ABNORMAL LOW (ref 300–900)

## 2020-06-27 LAB — FERRITIN: Ferritin: 103 ng/mL (ref 11–307)

## 2020-06-27 LAB — URIC ACID: Uric Acid, Serum: 5.9 mg/dL (ref 2.5–7.1)

## 2020-06-27 LAB — C-REACTIVE PROTEIN: CRP: 3.3 mg/dL — ABNORMAL HIGH (ref <1.0)

## 2020-06-27 LAB — PHOSPHORUS: Phosphorus: 2.7 mg/dL (ref 2.5–4.6)

## 2020-06-27 LAB — MAGNESIUM: Magnesium: 2 mg/dL (ref 1.7–2.4)

## 2020-06-27 MED ORDER — METHYLPREDNISOLONE SODIUM SUCC 125 MG IJ SOLR
60.0000 mg | Freq: Two times a day (BID) | INTRAMUSCULAR | Status: DC
  Administered 2020-06-27 – 2020-06-28 (×3): 60 mg via INTRAVENOUS
  Filled 2020-06-27 (×2): qty 2

## 2020-06-27 MED ORDER — ISOSORBIDE MONONITRATE ER 30 MG PO TB24
30.0000 mg | ORAL_TABLET | Freq: Every day | ORAL | Status: DC
  Administered 2020-06-27 – 2020-06-30 (×4): 30 mg via ORAL
  Filled 2020-06-27 (×4): qty 1

## 2020-06-27 NOTE — Progress Notes (Signed)
SATURATION QUALIFICATIONS: (This note is used to comply with regulatory documentation for home oxygen)  Patient Saturations on Room Air at Rest = 90%  Patient Saturations on Room Air while Ambulating = 83%  Patient Saturations on 2 Liters of oxygen while Ambulating = 94%  Please briefly explain why patient needs home oxygen: Pt desaturates on RA while walking.  She may need supplemental O2 for mobility.    Thanks,  Verdene Lennert, PT, DPT  Acute Rehabilitation Ortho Tech Supervisor [phone] pager [phone] office

## 2020-06-27 NOTE — Progress Notes (Signed)
PROGRESS NOTE                                                                                                                                                                                                             Patient Demographics:    Mandy [name], is a 80 y.o. female, DOB - [birth year], HAF:[id number]  Outpatient Primary MD for the patient is [name], MD    LOS - 1  Admit date - 06/26/2020    No chief complaint on file.      Brief Narrative (HPI from H&P)  Mandy [name] is a 80 y.o. [place] speaker female with medical history significant of hypertension, hyperlipidemia, diabetes mellitus type 2, hypothyroidism, and SBO presents with complaints of weakness.  Patient's daughter is present at bedside and acts interpreter for the patient.  She started having a mild intermittent cough approximately 6 days ago, in the ER diagnosed with COVID-19 pneumonia with acute hypoxic respiratory failure and admitted to the hospital.   Subjective:    [name] today has, No headache, No chest pain, No abdominal pain - No Nausea, No new weakness tingling or numbness, no SOB.   Assessment  & Plan :    Principal Problem:   Acute respiratory failure with hypoxia (HCC) Active Problems:   Diabetes mellitus type 2, controlled (HCC)   Hyponatremia   COVID-19 virus infection   Hypertensive urgency   Hypokalemia   Generalized weakness   Dizziness   Obesity (BMI 30-39.9)   Hyperthyroidism   Chronic diastolic CHF (congestive heart failure) (HCC)   Thymic neoplasm   Acute Hypoxic Resp. Failure due to Acute Covid 19 Viral Pneumonitis during the ongoing 2020 Covid 19 Pandemic - she has received 2 shots of mRNA vaccine, overall pneumonia and lung injury seem to be mild, mildly elevated inflammatory markers, she is on remdesivir and IV steroids which will be continued.  Encouraged the patient to sit up in chair in the  daytime use I-S and flutter valve for pulmonary toiletry.  Will advance activity and titrate down oxygen as possible.  SpO2: 95 % O2 Flow Rate (L/min): 4 L/min  Recent Labs  Lab 06/26/20 0401 06/26/20 0420 06/26/20 0914 06/26/20 1508 06/27/20 0158  WBC 4.2  --   --   --  3.5*  HGB 12.7  --  12.9  --  12.6  HCT 37.9  --  38.0  --  37.0  PLT 310  --   --   --  283  CRP  --   --   --  3.9* 3.3*  BNP 68.9  --   --   --   --   DDIMER 1.92*  --   --   --  1.43*  PROCALCITON <0.10  --   --   --   --   AST 24  --   --   --  25  ALT 12  --   --   --  13  ALKPHOS 47  --   --   --  49  BILITOT 0.6  --   --   --  0.2*  ALBUMIN 3.5  --   --   --  3.1*  LATICACIDVEN  --  1.4  --   --   --   SARSCOV2NAA POSITIVE*  --   --   --   --      Lab Results  Component Value Date   TSH 1.17 04/09/2020     2.  Hypertensive urgency upon admission.  Currently on combination of Norvasc, beta-blocker and ACE inhibitor, added Imdur for better control will monitor.  3.  Hypokalemia.  Replaced and stable.  4.  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  Currently compensated continue beta-blocker.  Home dose HCTZ held for now  5.  Dehydration hyponatremia -IV fluids, serum osmolality, urine osmolality and electrolytes.  Rule out SIADH.  6.  Dyslipidemia.  On statin.  7.  Hyperthyroidism.  On methimazole.  TSH recently was stable on 04/09/2020 at 1.1.  8.  GERD.  On PPI.  9.  Mildly elevated D-dimer.  Likely due to inflammation from COVID-19.  Due to morbid obesity risk of DVT is high hence will check leg ultrasound.    10. DM type II.  Currently on sliding scale will add Lantus if needed due to IV steroids  Lab Results  Component Value Date   HGBA1C 6.7 (H) 06/16/2020   CBG (last 3)  Recent Labs    06/26/20 1637 06/26/20 2159 06/27/20 0752  GLUCAP 175* 223* 134*    Obesity: Follow with PCP Estimated body mass index is 37.2 kg/m as calculated from the following:   Height  as of this encounter: 4\' 10"  (1.473 m).   Weight as of this encounter: 80.7 kg.          Condition - Fair  Family Communication  :  daughter [name] [phone] on 06/27/20  Code Status :  Full  Consults  :  None  PUD Prophylaxis : PPI   Procedures  :     Leg [place] -  CTA - 1. Negative for pulmonary embolism. 2. 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 Recommend CTS referral. 3. 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      Disposition Plan  :    Status is: Inpatient  Remains inpatient appropriate because:IV treatments appropriate due to intensity of illness or inability to take PO  Dispo: The patient is from: Home              Anticipated d/c is to: Home              Patient currently is not medically stable to d/c.   Difficult to place patient No DVT Prophylaxis  :  enoxaparin (LOVENOX) injection 40 mg Start: 06/26/20 1000   Lab Results  Component Value Date   PLT 283 06/27/2020    Diet :  Diet Order             Diet Carb Modified Fluid consistency: Thin; Room service appropriate? No  Diet effective now                    Inpatient Medications  Scheduled Meds:  albuterol  2 puff Inhalation Q6H   amLODipine  10 mg Oral Daily   vitamin C  500 mg Oral Daily   atenolol  50 mg Oral BID   benazepril  20 mg Oral Daily   calcium carbonate  500 mg of elemental calcium Oral BID WC   enoxaparin (LOVENOX) injection  40 mg Subcutaneous Q24H   gabapentin  100 mg Oral QHS   insulin aspart  0-15 Units Subcutaneous TID WC   insulin aspart  0-5 Units Subcutaneous QHS   isosorbide mononitrate  30 mg Oral Daily   methimazole  2.5 mg Oral Daily   methylPREDNISolone (SOLU-MEDROL) injection  60 mg Intravenous Q12H   pantoprazole  40 mg Oral Daily   simvastatin  10 mg Oral QHS   sodium chloride flush  3 mL Intravenous Q12H   vitamin B-12  1,000 mcg Oral Daily   zinc  sulfate  220 mg Oral Daily   Continuous Infusions:  remdesivir 100 mg in NS 100 mL 100 mg (06/27/20 0854)   PRN Meds:.acetaminophen, chlorpheniramine-HYDROcodone, diclofenac Sodium, guaiFENesin-dextromethorphan  Antibiotics  :    Anti-infectives (From admission, onward)    Start     Dose/Rate Route Frequency Ordered Stop   06/27/20 1000  remdesivir 100 mg in sodium chloride 0.9 % 100 mL IVPB       See Hyperspace for full Linked Orders Report.   100 mg 200 mL/hr over 30 Minutes Intravenous Daily 06/26/20 0647 07/01/20 0959   06/26/20 0700  remdesivir 200 mg in sodium chloride 0.9% 250 mL IVPB       See Hyperspace for full Linked Orders Report.   200 mg 580 mL/hr over 30 Minutes Intravenous Once 06/26/20 5176 06/26/20 0900        Time Spent in minutes  30   Lala Lund M.D on 06/27/2020 at 9:02 AM  To page go to [email or web address]   [facility] -  Office  [phone]   See all Orders from today for further details    Objective:   Vitals:   06/26/20 1921 06/27/20 0008 06/27/20 0446 06/27/20 0747  BP: (!) 151/70 (!) 156/80 (!) 154/60 (!) 165/81  Pulse: 77 71 60 73  Resp: 19 20 17 19   Temp: 98.3 F (36.8 C) 98 F (36.7 C) 98.3 F (36.8 C) 97.8 F (36.6 C)  TempSrc: Axillary Axillary Axillary Oral  SpO2: 91% 95% 97% 95%  Weight:      Height:        Wt Readings from Last 3 Encounters:  06/26/20 80.7 kg  06/16/20 79.8 kg  02/04/19 74 kg     Intake/Output Summary (Last 24 hours) at 06/27/2020 0902 Last data filed at 06/27/2020 0438 Gross per 24 hour  Intake 500 ml  Output 750 ml  Net -250 ml     Physical Exam  Awake Alert, No new F.N deficits, Normal affect [place].AT,PERRAL Supple Neck,No JVD, No cervical lymphadenopathy appriciated.  Symmetrical Chest wall movement, Good air movement bilaterally, CTAB RRR,No Gallops,Rubs or new Murmurs, No Parasternal  Heave +ve B.Sounds, Abd Soft, No tenderness, No organomegaly appriciated, No rebound - guarding or  rigidity. No Cyanosis, Clubbing or edema, No new Rash or bruise     RN pressure injury documentation:     Data Review:    CBC Recent Labs  Lab 06/26/20 0401 06/26/20 0914 06/27/20 0158  WBC 4.2  --  3.5*  HGB 12.7 12.9 12.6  HCT 37.9 38.0 37.0  PLT 310  --  283  MCV 80.6  --  80.1  MCH 27.0  --  27.3  MCHC 33.5  --  34.1  RDW 14.0  --  14.0  LYMPHSABS 0.8  --  1.0  MONOABS 0.3  --  0.3  EOSABS 0.0  --  0.0  BASOSABS 0.0  --  0.0    Recent Labs  Lab 06/26/20 0401 06/26/20 0420 06/26/20 0914 06/26/20 1508 06/27/20 0158  NA 128*  --  129*  --  128*  K 3.0*  --  3.3*  --  4.0  CL 86*  --   --   --  91*  CO2 31  --   --   --  29  GLUCOSE 163*  --   --   --  150*  BUN 19  --   --   --  20  CREATININE 1.17*  --   --   --  1.17*  CALCIUM 9.2  --   --   --  9.3  AST 24  --   --   --  25  ALT 12  --   --   --  13  ALKPHOS 47  --   --   --  49  BILITOT 0.6  --   --   --  0.2*  ALBUMIN 3.5  --   --   --  3.1*  MG 1.9  --   --   --  2.0  CRP  --   --   --  3.9* 3.3*  DDIMER 1.92*  --   --   --  1.43*  PROCALCITON <0.10  --   --   --   --   LATICACIDVEN  --  1.4  --   --   --   BNP 68.9  --   --   --   --     ------------------------------------------------------------------------------------------------------------------ Recent Labs    06/26/20 0420  TRIG 57    Lab Results  Component Value Date   HGBA1C 6.7 (H) 06/16/2020   ------------------------------------------------------------------------------------------------------------------ No results for input(s): TSH, T4TOTAL, T3FREE, THYROIDAB in the last 72 hours.  Invalid input(s): FREET3  Cardiac Enzymes No results for input(s): CKMB, TROPONINI, MYOGLOBIN in the last 168 hours.  Invalid input(s): CK ------------------------------------------------------------------------------------------------------------------    Component Value Date/Time   BNP 68.9 06/26/2020 0401    Micro Results Recent  Results (from the past 240 hour(s))  Resp Panel by RT-PCR (Flu A&B, Covid) Nasopharyngeal Swab     Status: Abnormal   Collection Time: 06/26/20  4:01 AM   Specimen: Nasopharyngeal Swab; Nasopharyngeal(NP) swabs in vial transport medium  Result Value Ref Range Status   SARS Coronavirus 2 by RT PCR POSITIVE (A) NEGATIVE Final    Comment: RESULT CALLED TO, READ BACK BY AND VERIFIED WITH: RN S WIRTZ [id number] BT 517 AM BY CM (NOTE) SARS-CoV-2 target nucleic acids are DETECTED.  The SARS-CoV-2 RNA is generally detectable in upper respiratory specimens during the acute phase of infection. Positive results are indicative of the  presence of the identified virus, but do not rule out bacterial infection or co-infection with other pathogens not detected by the test. Clinical correlation with patient history and other diagnostic information is necessary to determine patient infection status. The expected result is Negative.  Fact Sheet for Patients: EntrepreneurPulse.com.au  Fact Sheet for Healthcare Providers: IncredibleEmployment.be  This test is not yet approved or cleared by the [place] FDA and  has been authorized for detection and/or diagnosis of SARS-CoV-2 by FDA under an Emergency Use Authorization (EUA).  This EUA will remain in effect (meaning this test can be  used) for the duration of  the COVID-19 declaration under Section 564(b)(1) of the Act, 21 U.S.C. section 360bbb-3(b)(1), unless the authorization is terminated or revoked sooner.     Influenza A by PCR NEGATIVE NEGATIVE Final   Influenza B by PCR NEGATIVE NEGATIVE Final    Comment: (NOTE) The Xpert Xpress SARS-CoV-2/FLU/RSV plus assay is intended as an aid in the diagnosis of influenza from Nasopharyngeal swab specimens and should not be used as a sole basis for treatment. Nasal washings and aspirates are unacceptable for Xpert Xpress SARS-CoV-2/FLU/RSV testing.  Fact Sheet for  Patients: EntrepreneurPulse.com.au  Fact Sheet for Healthcare Providers: IncredibleEmployment.be  This test is not yet approved or cleared by the [place] FDA and has been authorized for detection and/or diagnosis of SARS-CoV-2 by FDA under an Emergency Use Authorization (EUA). This EUA will remain in effect (meaning this test can be used) for the duration of the COVID-19 declaration under Section 564(b)(1) of the Act, 21 U.S.C. section 360bbb-3(b)(1), unless the authorization is terminated or revoked.  Performed at [facility], [place] [address], [place], [place] [address]   MRSA Next Gen by PCR, Nasal     Status: None   Collection Time: 06/26/20  1:44 PM   Specimen: Nasal Mucosa; Nasal Swab  Result Value Ref Range Status   MRSA by PCR Next Gen NOT DETECTED NOT DETECTED Final    Comment: (NOTE) The GeneXpert MRSA Assay (FDA approved for NASAL specimens only), is one component of a comprehensive MRSA colonization surveillance program. It is not intended to diagnose MRSA infection nor to guide or monitor treatment for MRSA infections. Test performance is not FDA approved in patients less than 57 years old. Performed at [facility], [place] [address], [place], [place] [address]     Radiology Reports CT Head Wo Contrast  Result Date: 06/26/2020 CLINICAL DATA:  COVID positive. Productive cough and shortness of breath with dizziness. EXAM: CT HEAD WITHOUT CONTRAST TECHNIQUE: Contiguous axial images were obtained from the base of the skull through the vertex without intravenous contrast. COMPARISON:  01/31/2019 FINDINGS: Brain: No evidence of acute infarction, hemorrhage, hydrocephalus, extra-axial collection or mass lesion/mass effect. Patchy low-density in the cerebral white matter attributed to chronic small vessel ischemia. Normal brain volume Vascular: No hyperdense vessel or unexpected calcification. Skull: Normal. Negative for  fracture or focal lesion. Sinuses/Orbits: No acute finding. IMPRESSION: No acute finding or change from 2021. Electronically Signed   By: Monte Fantasia M.D.   On: 06/26/2020 07:50   CT Angio Chest PE W and/or Wo Contrast  Result Date: 06/26/2020 CLINICAL DATA:  COVID positive.  Shortness of breath and cough. EXAM: CT ANGIOGRAPHY CHEST WITH CONTRAST TECHNIQUE: Multidetector CT imaging of the chest was performed using the standard protocol during bolus administration of intravenous contrast. Multiplanar CT image reconstructions and MIPs were obtained to evaluate the vascular anatomy. CONTRAST:  38mL OMNIPAQUE  IOHEXOL 350 MG/ML SOLN COMPARISON:  None. FINDINGS: Cardiovascular: Cardiomegaly. Dilated main pulmonary artery to 3.8 cm. No pericardial effusion. No acute aortic finding. No pulmonary artery filling defect. There is diffuse atheromatous calcification of the aorta. Mediastinum/Nodes: Lobulated anterior mediastinal mass measuring 8 cm craniocaudal, 6.5 cm transverse, and 2.4 cm in thickness. No invasive features or superimposed adenopathy. 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 Bilateral atelectasis or scarring. There is no edema, consolidation, effusion, or pneumothorax. Upper Abdomen: Negative Musculoskeletal: Generalized thoracic disc degeneration. Prominent bilateral glenohumeral osteoarthritis. Review of the MIP images confirms the above findings. IMPRESSION: 1. Negative for pulmonary embolism. 2. 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 Recommend CTS referral. 3. 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 Electronically Signed   By: Monte Fantasia M.D.   On: 06/26/2020 07:56   DG Chest Port 1 View  Result Date: 06/26/2020 CLINICAL DATA:  COVID pneumonia EXAM: PORTABLE CHEST 1 VIEW COMPARISON:  01/31/2019 FINDINGS: Mild bibasilar atelectasis or infiltrate.  No pneumothorax. Possible small left pleural effusion. Mild cardiomegaly is stable. Pulmonary vascularity is normal. IMPRESSION: Interval development of mild bibasilar atelectasis or infiltrate. Possible small left pleural effusion. Stable cardiomegaly. Electronically Signed   By: Fidela Salisbury MD   On: 06/26/2020 04:39

## 2020-06-28 ENCOUNTER — Inpatient Hospital Stay (HOSPITAL_COMMUNITY): Payer: Medicare HMO

## 2020-06-28 DIAGNOSIS — U071 COVID-19: Secondary | ICD-10-CM

## 2020-06-28 DIAGNOSIS — R7989 Other specified abnormal findings of blood chemistry: Secondary | ICD-10-CM

## 2020-06-28 LAB — GLUCOSE, CAPILLARY
Glucose-Capillary: 159 mg/dL — ABNORMAL HIGH (ref 70–99)
Glucose-Capillary: 214 mg/dL — ABNORMAL HIGH (ref 70–99)
Glucose-Capillary: 195 mg/dL — ABNORMAL HIGH (ref 70–99)
Glucose-Capillary: 257 mg/dL — ABNORMAL HIGH (ref 70–99)

## 2020-06-28 LAB — CBC WITH DIFFERENTIAL/PLATELET
Abs Immature Granulocytes: 0.03 10*3/uL (ref 0.00–0.07)
Basophils Absolute: 0 10*3/uL (ref 0.0–0.1)
Basophils Relative: 0 %
Eosinophils Absolute: 0 10*3/uL (ref 0.0–0.5)
Eosinophils Relative: 0 %
HCT: 35.8 % — ABNORMAL LOW (ref 36.0–46.0)
Hemoglobin: 11.8 g/dL — ABNORMAL LOW (ref 12.0–15.0)
Immature Granulocytes: 0 %
Lymphocytes Relative: 10 %
Lymphs Abs: 0.8 10*3/uL (ref 0.7–4.0)
MCH: 26.6 pg (ref 26.0–34.0)
MCHC: 33 g/dL (ref 30.0–36.0)
MCV: 80.6 fL (ref 80.0–100.0)
Monocytes Absolute: 0.2 10*3/uL (ref 0.1–1.0)
Monocytes Relative: 3 %
Neutro Abs: 6.6 10*3/uL (ref 1.7–7.7)
Neutrophils Relative %: 87 %
Platelets: 289 10*3/uL (ref 150–400)
RBC: 4.44 MIL/uL (ref 3.87–5.11)
RDW: 14 % (ref 11.5–15.5)
WBC: 7.6 10*3/uL (ref 4.0–10.5)
nRBC: 0 % (ref 0.0–0.2)

## 2020-06-28 LAB — D-DIMER, QUANTITATIVE: D-Dimer, Quant: 1.24 ug/mL-FEU — ABNORMAL HIGH (ref 0.00–0.50)

## 2020-06-28 LAB — MAGNESIUM: Magnesium: 1.9 mg/dL (ref 1.7–2.4)

## 2020-06-28 LAB — COMPREHENSIVE METABOLIC PANEL
ALT: 13 U/L (ref 0–44)
AST: 20 U/L (ref 15–41)
Albumin: 3 g/dL — ABNORMAL LOW (ref 3.5–5.0)
Alkaline Phosphatase: 45 U/L (ref 38–126)
Anion gap: 9 (ref 5–15)
BUN: 30 mg/dL — ABNORMAL HIGH (ref 8–23)
CO2: 26 mmol/L (ref 22–32)
Calcium: 8.8 mg/dL — ABNORMAL LOW (ref 8.9–10.3)
Chloride: 95 mmol/L — ABNORMAL LOW (ref 98–111)
Creatinine, Ser: 1.33 mg/dL — ABNORMAL HIGH (ref 0.44–1.00)
GFR, Estimated: 41 mL/min — ABNORMAL LOW (ref >60)
Glucose, Bld: 221 mg/dL — ABNORMAL HIGH (ref 70–99)
Potassium: 4.1 mmol/L (ref 3.5–5.1)
Sodium: 130 mmol/L — ABNORMAL LOW (ref 135–145)
Total Bilirubin: 0.4 mg/dL (ref 0.3–1.2)
Total Protein: 6.5 g/dL (ref 6.5–8.1)

## 2020-06-28 LAB — C-REACTIVE PROTEIN: CRP: 1.4 mg/dL — ABNORMAL HIGH (ref <1.0)

## 2020-06-28 MED ORDER — ALBUTEROL SULFATE HFA 108 (90 BASE) MCG/ACT IN AERS
2.0000 | INHALATION_SPRAY | Freq: Three times a day (TID) | RESPIRATORY_TRACT | Status: DC
  Administered 2020-06-28 – 2020-06-30 (×7): 2 via RESPIRATORY_TRACT

## 2020-06-28 MED ORDER — LACTATED RINGERS IV SOLN: INTRAVENOUS | Status: AC

## 2020-06-28 MED ORDER — METHYLPREDNISOLONE SODIUM SUCC 40 MG IJ SOLR
20.0000 mg | Freq: Every day | INTRAMUSCULAR | Status: DC
  Filled 2020-06-28: qty 1

## 2020-06-28 MED ORDER — CARVEDILOL 6.25 MG PO TABS
6.2500 mg | ORAL_TABLET | Freq: Two times a day (BID) | ORAL | Status: DC
  Administered 2020-06-28 – 2020-06-30 (×4): 6.25 mg via ORAL
  Filled 2020-06-28 (×5): qty 1

## 2020-06-28 NOTE — Progress Notes (Signed)
PROGRESS NOTE                                                                                                                                                                                                             Patient Demographics:    Mandy [name], is a 80 y.o. female, DOB - 1940-11-22, FBX:[id number]  Outpatient Primary MD for the patient is [name], MD    LOS - 2  Admit date - 06/26/2020    No chief complaint on file.      Brief Narrative (HPI from H&P)  Mandy [name] is a 80 y.o. [place] speaker female with medical history significant of hypertension, hyperlipidemia, diabetes mellitus type 2, hypothyroidism, and SBO presents with complaints of weakness.  Patient's daughter is present at bedside and acts interpreter for the patient.  She started having a mild intermittent cough approximately 6 days ago, in the ER diagnosed with COVID-19 pneumonia with acute hypoxic respiratory failure and admitted to the hospital.   Subjective:   Patient in bed, appears comfortable, denies any headache, no fever, no chest pain or pressure, no shortness of breath , no abdominal pain. No new focal weakness.   Assessment  & Plan :     Acute Hypoxic Resp. Failure due to Acute Covid 19 Viral Pneumonitis during the ongoing 2020 Covid 19 Pandemic - she has received 2 shots of mRNA vaccine, overall pneumonia and lung injury seem to be mild, mildly elevated inflammatory markers, she is on remdesivir and IV steroids which will be continued.  Encouraged the patient to sit up in chair in the daytime use I-S and flutter valve for pulmonary toiletry.  Will advance activity and titrate down oxygen as possible.  SpO2: 96 % O2 Flow Rate (L/min): 2 L/min  Recent Labs  Lab 06/26/20 0401 06/26/20 0420 06/26/20 0914 06/26/20 1508 06/27/20 0158 06/28/20 0031  WBC 4.2  --   --   --  3.5* 7.6  HGB 12.7  --  12.9  --  12.6 11.8*   HCT 37.9  --  38.0  --  37.0 35.8*  PLT 310  --   --   --  283 289  CRP  --   --   --  3.9* 3.3* 1.4*  BNP 68.9  --   --   --   --   --  DDIMER 1.92*  --   --   --  1.43* 1.24*  PROCALCITON <0.10  --   --   --   --   --   AST 24  --   --   --  25 20  ALT 12  --   --   --  13 13  ALKPHOS 47  --   --   --  49 45  BILITOT 0.6  --   --   --  0.2* 0.4  ALBUMIN 3.5  --   --   --  3.1* 3.0*  LATICACIDVEN  --  1.4  --   --   --   --   SARSCOV2NAA POSITIVE*  --   --   --   --   --      Lab Results  Component Value Date   TSH 1.17 04/09/2020     2.  Hypertensive urgency upon admission.  Currently on combination of Norvasc, beta-blocker and ACE inhibitor, added Imdur for better control will monitor. B Blocker adjusted for better control.  3.  Hypokalemia.  Replaced and stable.  4.  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  Currently compensated continue beta-blocker.  Home dose HCTZ held for now.  5.  Dehydration, hyponatremia with AKI - IV fluids continue, improving.  Ruled out SIADH.  6.  Dyslipidemia.  On statin.  7.  Hyperthyroidism.  On methimazole.  TSH recently was stable on 04/09/2020 at 1.1.  8.  GERD.  On PPI.  9.  Mildly elevated D-dimer.  Likely due to inflammation from COVID-19.  Due to morbid obesity risk of DVT is high hence will check leg ultrasound.    10. DM type II.  Currently on sliding scale will add Lantus if needed due to IV steroids  Lab Results  Component Value Date   HGBA1C 6.7 (H) 06/16/2020   CBG (last 3)  Recent Labs    06/27/20 1649 06/27/20 2201 06/28/20 0743  GLUCAP 176* 255* 159*    Obesity: Follow with PCP Estimated body mass index is 37.2 kg/m as calculated from the following:   Height as of this encounter: 4\' 10"  (1.473 m).   Weight as of this encounter: 80.7 kg.          Condition - Fair  Family Communication  :  daughter [name] [phone] on 06/27/20, 06/28/20 @ 8.52 am message left  Code Status :   Full  Consults  :  None  PUD Prophylaxis : PPI   Procedures  :     Leg [place] -  CTA - 1. Negative for pulmonary embolism. 2. 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 Recommend CTS referral. 3. 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      Disposition Plan  :    Status is: Inpatient  Remains inpatient appropriate because:IV treatments appropriate due to intensity of illness or inability to take PO  Dispo: The patient is from: Home              Anticipated d/c is to: Home              Patient currently is not medically stable to d/c.   Difficult to place patient No DVT Prophylaxis  :    enoxaparin (LOVENOX) injection 40 mg Start: 06/26/20 1000   Lab Results  Component Value Date   PLT 289 06/28/2020    Diet :  Diet Order             Diet Carb Modified Fluid consistency: Thin; Room service appropriate? No  Diet effective now                    Inpatient Medications  Scheduled Meds:  albuterol  2 puff Inhalation TID   amLODipine  10 mg Oral Daily   vitamin C  500 mg Oral Daily   benazepril  20 mg Oral Daily   calcium carbonate  500 mg of elemental calcium Oral BID WC   carvedilol  6.25 mg Oral BID WC   enoxaparin (LOVENOX) injection  40 mg Subcutaneous Q24H   gabapentin  100 mg Oral QHS   insulin aspart  0-15 Units Subcutaneous TID WC   insulin aspart  0-5 Units Subcutaneous QHS   isosorbide mononitrate  30 mg Oral Daily   methimazole  2.5 mg Oral Daily   [START ON 06/29/2020] methylPREDNISolone (SOLU-MEDROL) injection  20 mg Intravenous Daily   pantoprazole  40 mg Oral Daily   simvastatin  10 mg Oral QHS   sodium chloride flush  3 mL Intravenous Q12H   vitamin B-12  1,000 mcg Oral Daily   zinc sulfate  220 mg Oral Daily   Continuous Infusions:  lactated ringers     remdesivir 100 mg in NS 100 mL 100 mg (06/27/20 0854)   PRN Meds:.acetaminophen,  chlorpheniramine-HYDROcodone, diclofenac Sodium, guaiFENesin-dextromethorphan  Antibiotics  :    Anti-infectives (From admission, onward)    Start     Dose/Rate Route Frequency Ordered Stop   06/27/20 1000  remdesivir 100 mg in sodium chloride 0.9 % 100 mL IVPB       See Hyperspace for full Linked Orders Report.   100 mg 200 mL/hr over 30 Minutes Intravenous Daily 06/26/20 0647 07/01/20 0959   06/26/20 0700  remdesivir 200 mg in sodium chloride 0.9% 250 mL IVPB       See Hyperspace for full Linked Orders Report.   200 mg 580 mL/hr over 30 Minutes Intravenous Once 06/26/20 9371 06/26/20 0900        Time Spent in minutes  30   Lala Lund M.D on 06/28/2020 at 8:53 AM  To page go to [email or web address]   [facility] -  Office  [phone]   See all Orders from today for further details    Objective:   Vitals:   06/27/20 2004 06/27/20 2347 06/28/20 0400 06/28/20 0735  BP: (!) 150/61 126/65 137/72 (!) 142/119  Pulse: 62 (!) 59 60 (!) 57  Resp: 20 20 16 20   Temp: 98.1 F (36.7 C) 98 F (36.7 C) 97.7 F (36.5 C) 97.9 F (36.6 C)  TempSrc: Axillary Axillary Oral Oral  SpO2: 90% 93% 97% 96%  Weight:      Height:        Wt Readings from Last 3 Encounters:  06/26/20 80.7 kg  06/16/20 79.8 kg  02/04/19 74 kg     Intake/Output Summary (Last 24 hours) at 06/28/2020 0853 Last data filed at 06/28/2020 0441 Gross per 24 hour  Intake 858 ml  Output 1700 ml  Net -842 ml     Physical Exam  Awake Alert, No new F.N deficits, Normal affect [place].AT,PERRAL Supple Neck,No JVD, No cervical lymphadenopathy appriciated.  Symmetrical Chest wall movement, Good air movement bilaterally, CTAB RRR,No Gallops, Rubs or new Murmurs, No Parasternal Heave +ve B.Sounds, Abd Soft, No tenderness, No organomegaly appriciated, No rebound - guarding or rigidity.  No Cyanosis, Clubbing or edema, No new Rash or bruise   RN pressure injury documentation:     Data Review:     CBC Recent Labs  Lab 06/26/20 0401 06/26/20 0914 06/27/20 0158 06/28/20 0031  WBC 4.2  --  3.5* 7.6  HGB 12.7 12.9 12.6 11.8*  HCT 37.9 38.0 37.0 35.8*  PLT 310  --  283 289  MCV 80.6  --  80.1 80.6  MCH 27.0  --  27.3 26.6  MCHC 33.5  --  34.1 33.0  RDW 14.0  --  14.0 14.0  LYMPHSABS 0.8  --  1.0 0.8  MONOABS 0.3  --  0.3 0.2  EOSABS 0.0  --  0.0 0.0  BASOSABS 0.0  --  0.0 0.0    Recent Labs  Lab 06/26/20 0401 06/26/20 0420 06/26/20 0914 06/26/20 1508 06/27/20 0158 06/28/20 0031  NA 128*  --  129*  --  128* 130*  K 3.0*  --  3.3*  --  4.0 4.1  CL 86*  --   --   --  91* 95*  CO2 31  --   --   --  29 26  GLUCOSE 163*  --   --   --  150* 221*  BUN 19  --   --   --  20 30*  CREATININE 1.17*  --   --   --  1.17* 1.33*  CALCIUM 9.2  --   --   --  9.3 8.8*  AST 24  --   --   --  25 20  ALT 12  --   --   --  13 13  ALKPHOS 47  --   --   --  49 45  BILITOT 0.6  --   --   --  0.2* 0.4  ALBUMIN 3.5  --   --   --  3.1* 3.0*  MG 1.9  --   --   --  2.0 1.9  CRP  --   --   --  3.9* 3.3* 1.4*  DDIMER 1.92*  --   --   --  1.43* 1.24*  PROCALCITON <0.10  --   --   --   --   --   LATICACIDVEN  --  1.4  --   --   --   --   BNP 68.9  --   --   --   --   --     ------------------------------------------------------------------------------------------------------------------ Recent Labs    06/26/20 0420  TRIG 57    Lab Results  Component Value Date   HGBA1C 6.7 (H) 06/16/2020   ------------------------------------------------------------------------------------------------------------------ No results for input(s): TSH, T4TOTAL, T3FREE, THYROIDAB in the last 72 hours.  Invalid input(s): FREET3  Cardiac Enzymes No results for input(s): CKMB, TROPONINI, MYOGLOBIN in the last 168 hours.  Invalid input(s): CK ------------------------------------------------------------------------------------------------------------------    Component Value Date/Time   BNP 68.9  06/26/2020 0401    Micro Results Recent Results (from the past 240 hour(s))  Resp Panel by RT-PCR (Flu A&B, Covid) Nasopharyngeal Swab     Status: Abnormal   Collection Time: 06/26/20  4:01 AM   Specimen: Nasopharyngeal Swab; Nasopharyngeal(NP) swabs in vial transport medium  Result Value Ref Range Status   SARS Coronavirus 2 by RT PCR POSITIVE (A) NEGATIVE Final    Comment: RESULT CALLED TO, READ BACK BY AND VERIFIED WITH: RN S WIRTZ [id number] KG 254 AM BY CM (NOTE) SARS-CoV-2 target nucleic acids are DETECTED.  The SARS-CoV-2 RNA is generally detectable in upper respiratory specimens during the acute phase of infection. Positive results are indicative of the presence of the identified virus, but do not rule out bacterial infection or co-infection with other pathogens not detected by the test. Clinical correlation with patient history and other diagnostic information is necessary to determine patient infection status. The expected result is Negative.  Fact Sheet for Patients: EntrepreneurPulse.com.au  Fact Sheet for Healthcare Providers: IncredibleEmployment.be  This test is not yet approved or cleared by the [place] FDA and  has been authorized for detection and/or diagnosis of SARS-CoV-2 by FDA under an Emergency Use Authorization (EUA).  This EUA will remain in effect (meaning this test can be  used) for the duration of  the COVID-19 declaration under Section 564(b)(1) of the Act, 21 U.S.C. section 360bbb-3(b)(1), unless the authorization is terminated or revoked sooner.     Influenza A by PCR NEGATIVE NEGATIVE Final   Influenza B by PCR NEGATIVE NEGATIVE Final    Comment: (NOTE) The Xpert Xpress SARS-CoV-2/FLU/RSV plus assay is intended as an aid in the diagnosis of influenza from Nasopharyngeal swab specimens and should not be used as a sole basis for treatment. Nasal washings and aspirates are unacceptable for Xpert Xpress  SARS-CoV-2/FLU/RSV testing.  Fact Sheet for Patients: EntrepreneurPulse.com.au  Fact Sheet for Healthcare Providers: IncredibleEmployment.be  This test is not yet approved or cleared by the [place] FDA and has been authorized for detection and/or diagnosis of SARS-CoV-2 by FDA under an Emergency Use Authorization (EUA). This EUA will remain in effect (meaning this test can be used) for the duration of the COVID-19 declaration under Section 564(b)(1) of the Act, 21 U.S.C. section 360bbb-3(b)(1), unless the authorization is terminated or revoked.  Performed at [facility], [place] [address] [place], [place] [address]   Blood Culture (routine x 2)     Status: None (Preliminary result)   Collection Time: 06/26/20  4:20 AM   Specimen: BLOOD RIGHT ARM  Result Value Ref Range Status   Specimen Description BLOOD RIGHT ARM  Final   Special Requests   Final    BOTTLES DRAWN AEROBIC AND ANAEROBIC Blood Culture adequate volume   Culture   Final    NO GROWTH 1 DAY Performed at [facility], [place] [address] [place], [place] [address]    Report Status PENDING  Incomplete  MRSA Next Gen by PCR, Nasal     Status: None   Collection Time: 06/26/20  1:44 PM   Specimen: Nasal Mucosa; Nasal Swab  Result Value Ref Range Status   MRSA by PCR Next Gen NOT DETECTED NOT DETECTED Final    Comment: (NOTE) The GeneXpert MRSA Assay (FDA approved for NASAL specimens only), is one component of a comprehensive MRSA colonization surveillance program. It is not intended to diagnose MRSA infection nor to guide or monitor treatment for MRSA infections. Test performance is not FDA approved in patients less than 48 years old. Performed at [facility], [place] [address] [place], [place] [address]   Blood Culture (routine x 2)     Status: None (Preliminary result)   Collection Time: 06/26/20  3:08 PM   Specimen: BLOOD RIGHT HAND  Result Value Ref Range  Status   Specimen Description BLOOD RIGHT HAND  Final   Special Requests   Final    BOTTLES DRAWN AEROBIC ONLY Blood Culture results may not be optimal due to an inadequate volume of blood received in  culture bottles   Culture   Final    NO GROWTH < 24 HOURS Performed at [facility], [place] [address], [place], [place] [address]    Report Status PENDING  Incomplete    Radiology Reports CT Head Wo Contrast  Result Date: 06/26/2020 CLINICAL DATA:  COVID positive. Productive cough and shortness of breath with dizziness. EXAM: CT HEAD WITHOUT CONTRAST TECHNIQUE: Contiguous axial images were obtained from the base of the skull through the vertex without intravenous contrast. COMPARISON:  01/31/2019 FINDINGS: Brain: No evidence of acute infarction, hemorrhage, hydrocephalus, extra-axial collection or mass lesion/mass effect. Patchy low-density in the cerebral white matter attributed to chronic small vessel ischemia. Normal brain volume Vascular: No hyperdense vessel or unexpected calcification. Skull: Normal. Negative for fracture or focal lesion. Sinuses/Orbits: No acute finding. IMPRESSION: No acute finding or change from 2021. Electronically Signed   By: Monte Fantasia M.D.   On: 06/26/2020 07:50   CT Angio Chest PE W and/or Wo Contrast  Result Date: 06/26/2020 CLINICAL DATA:  COVID positive.  Shortness of breath and cough. EXAM: CT ANGIOGRAPHY CHEST WITH CONTRAST TECHNIQUE: Multidetector CT imaging of the chest was performed using the standard protocol during bolus administration of intravenous contrast. Multiplanar CT image reconstructions and MIPs were obtained to evaluate the vascular anatomy. CONTRAST:  65mL OMNIPAQUE IOHEXOL 350 MG/ML SOLN COMPARISON:  None. FINDINGS: Cardiovascular: Cardiomegaly. Dilated main pulmonary artery to 3.8 cm. No pericardial effusion. No acute aortic finding. No pulmonary artery filling defect. There is diffuse atheromatous calcification of the aorta.  Mediastinum/Nodes: Lobulated anterior mediastinal mass measuring 8 cm craniocaudal, 6.5 cm transverse, and 2.4 cm in thickness. No invasive features or superimposed adenopathy. 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 Bilateral atelectasis or scarring. There is no edema, consolidation, effusion, or pneumothorax. Upper Abdomen: Negative Musculoskeletal: Generalized thoracic disc degeneration. Prominent bilateral glenohumeral osteoarthritis. Review of the MIP images confirms the above findings. IMPRESSION: 1. Negative for pulmonary embolism. 2. 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 Recommend CTS referral. 3. 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 Electronically Signed   By: Monte Fantasia M.D.   On: 06/26/2020 07:56   DG Chest Port 1 View  Result Date: 06/26/2020 CLINICAL DATA:  COVID pneumonia EXAM: PORTABLE CHEST 1 VIEW COMPARISON:  01/31/2019 FINDINGS: Mild bibasilar atelectasis or infiltrate. No pneumothorax. Possible small left pleural effusion. Mild cardiomegaly is stable. Pulmonary vascularity is normal. IMPRESSION: Interval development of mild bibasilar atelectasis or infiltrate. Possible small left pleural effusion. Stable cardiomegaly. Electronically Signed   By: Fidela Salisbury MD   On: 06/26/2020 04:39

## 2020-06-28 NOTE — Progress Notes (Signed)
VASCULAR LAB    Bilateral lower extremity venous duplex has been performed.  See CV proc for preliminary results.   Devontay Celaya, RVT 06/28/2020, 9:10 AM

## 2020-06-29 ENCOUNTER — Inpatient Hospital Stay (HOSPITAL_COMMUNITY): Payer: Medicare HMO

## 2020-06-29 LAB — CBC WITH DIFFERENTIAL/PLATELET
Abs Immature Granulocytes: 0.07 10*3/uL (ref 0.00–0.07)
Basophils Absolute: 0 10*3/uL (ref 0.0–0.1)
Basophils Relative: 0 %
Eosinophils Absolute: 0 10*3/uL (ref 0.0–0.5)
Eosinophils Relative: 0 %
HCT: 35.8 % — ABNORMAL LOW (ref 36.0–46.0)
Hemoglobin: 12.1 g/dL (ref 12.0–15.0)
Immature Granulocytes: 1 %
Lymphocytes Relative: 10 %
Lymphs Abs: 1.2 10*3/uL (ref 0.7–4.0)
MCH: 27.1 pg (ref 26.0–34.0)
MCHC: 33.8 g/dL (ref 30.0–36.0)
MCV: 80.1 fL (ref 80.0–100.0)
Monocytes Absolute: 0.5 10*3/uL (ref 0.1–1.0)
Monocytes Relative: 4 %
Neutro Abs: 9.5 10*3/uL — ABNORMAL HIGH (ref 1.7–7.7)
Neutrophils Relative %: 85 %
Platelets: 312 10*3/uL (ref 150–400)
RBC: 4.47 MIL/uL (ref 3.87–5.11)
RDW: 14.1 % (ref 11.5–15.5)
WBC: 11.2 10*3/uL — ABNORMAL HIGH (ref 4.0–10.5)
nRBC: 0 % (ref 0.0–0.2)

## 2020-06-29 LAB — COMPREHENSIVE METABOLIC PANEL
ALT: 13 U/L (ref 0–44)
AST: 21 U/L (ref 15–41)
Albumin: 3 g/dL — ABNORMAL LOW (ref 3.5–5.0)
Alkaline Phosphatase: 44 U/L (ref 38–126)
Anion gap: 9 (ref 5–15)
BUN: 30 mg/dL — ABNORMAL HIGH (ref 8–23)
CO2: 30 mmol/L (ref 22–32)
Calcium: 8.7 mg/dL — ABNORMAL LOW (ref 8.9–10.3)
Chloride: 93 mmol/L — ABNORMAL LOW (ref 98–111)
Creatinine, Ser: 1.34 mg/dL — ABNORMAL HIGH (ref 0.44–1.00)
GFR, Estimated: 40 mL/min — ABNORMAL LOW (ref >60)
Glucose, Bld: 165 mg/dL — ABNORMAL HIGH (ref 70–99)
Potassium: 4.6 mmol/L (ref 3.5–5.1)
Sodium: 132 mmol/L — ABNORMAL LOW (ref 135–145)
Total Bilirubin: 0.3 mg/dL (ref 0.3–1.2)
Total Protein: 6.1 g/dL — ABNORMAL LOW (ref 6.5–8.1)

## 2020-06-29 LAB — GLUCOSE, CAPILLARY
Glucose-Capillary: 107 mg/dL — ABNORMAL HIGH (ref 70–99)
Glucose-Capillary: 122 mg/dL — ABNORMAL HIGH (ref 70–99)
Glucose-Capillary: 142 mg/dL — ABNORMAL HIGH (ref 70–99)
Glucose-Capillary: 150 mg/dL — ABNORMAL HIGH (ref 70–99)

## 2020-06-29 LAB — C-REACTIVE PROTEIN: CRP: 0.9 mg/dL (ref <1.0)

## 2020-06-29 LAB — D-DIMER, QUANTITATIVE: D-Dimer, Quant: 0.99 ug/mL-FEU — ABNORMAL HIGH (ref 0.00–0.50)

## 2020-06-29 LAB — MAGNESIUM: Magnesium: 2.1 mg/dL (ref 1.7–2.4)

## 2020-06-29 MED ORDER — NYSTATIN 100000 UNIT/ML MT SUSP
5.0000 mL | Freq: Four times a day (QID) | OROMUCOSAL | Status: DC
  Administered 2020-06-29 – 2020-06-30 (×5): 500000 [IU] via OROMUCOSAL
  Filled 2020-06-29 (×5): qty 5

## 2020-06-29 MED ORDER — ONDANSETRON HCL 4 MG/2ML IJ SOLN: 4.0000 mg | Freq: Four times a day (QID) | INTRAMUSCULAR | Status: DC | PRN

## 2020-06-29 MED ORDER — HYDRALAZINE HCL 50 MG PO TABS
50.0000 mg | ORAL_TABLET | Freq: Three times a day (TID) | ORAL | Status: DC
  Administered 2020-06-29 – 2020-06-30 (×4): 50 mg via ORAL
  Filled 2020-06-29 (×4): qty 1

## 2020-06-29 NOTE — Plan of Care (Signed)
  Problem: Clinical Measurements: Goal: Ability to maintain clinical measurements within normal limits will improve Outcome: Progressing Goal: Will remain free from infection Outcome: Progressing Goal: Diagnostic test results will improve Outcome: Progressing Goal: Respiratory complications will improve Outcome: Progressing Goal: Cardiovascular complication will be avoided Outcome: Progressing   Problem: Activity: Goal: Risk for activity intolerance will decrease Outcome: Progressing   Problem: Elimination: Goal: Will not experience complications related to bowel motility Outcome: Progressing Goal: Will not experience complications related to urinary retention Outcome: Progressing   

## 2020-06-29 NOTE — Progress Notes (Signed)
Attempted to call daughter with numbers listed under the patient's contact information. The home phone number has a recording stating the number is not in service. There was no answer on the mobile phone number listed.

## 2020-06-29 NOTE — Progress Notes (Signed)
Inpatient Diabetes Program Recommendations  AACE/ADA: New Consensus Statement on Inpatient Glycemic Control (2015)  Target Ranges:  Prepandial:   less than 140 mg/dL      Peak postprandial:   less than 180 mg/dL (1-2 hours)      Critically ill patients:  140 - 180 mg/dL   Lab Results  Component Value Date   GLUCAP 122 (H) 06/29/2020   HGBA1C 6.7 (H) 06/16/2020    Review of Glycemic Control Results for LEXINE, JASPERS (MRN [id number]) as of 06/29/2020 12:38  Ref. Range 06/28/2020 20:05 06/29/2020 08:13 06/29/2020 12:32  Glucose-Capillary Latest Ref Range: 70 - 99 mg/dL 257 (H) 107 (H) 122 (H)   Diabetes history: Type 2 DM Outpatient Diabetes medications: Metformin 500 mg BID Current orders for Inpatient glycemic control: Novolog 0-15 units TID, Novolog 0-5 units QHS  Inpatient Diabetes Program Recommendations:    Noted consult and plan for oral antidiabetic agents at discharge.  Spoke with daughter regarding patient's diabetes care at home.  Reviewed patient's current A1c of 6.7% and goals per ADA guidelines given patient's age. Explained what a A1c is and what it measures. Also reviewed goal A1c with patient, importance of good glucose control @ home, and blood sugar goals. Reviewed survival skills, interventions, impact of infection on glycemic control, current inpatient trends, increase to glucose with steroids, discussed oral agents vs insulin, vascular changes and comorbidites. Patient's daughter reports checking CBGs atleast 3 times per day and getting her test strips through [facility]. Very knowledgeable on when to reach out to MD and they have a PCP visit planned.  Denies drinking sugary beverages. Reports good compliance with plate method and trying to balance CHO intake. Denies questions at this time.   Thanks, Bronson Curb, MSN, RNC-OB Diabetes Coordinator [phone] (8a-5p)

## 2020-06-29 NOTE — Progress Notes (Signed)
PROGRESS NOTE                                                                                                                                                                                                             Patient Demographics:    Mandy [name], is a 80 y.o. female, DOB - 09-Sep-1940, QIH:[id number]  Outpatient Primary MD for the patient is [name], MD    LOS - 3  Admit date - 06/26/2020    No chief complaint on file.      Brief Narrative (HPI from H&P)  Mandy [name] is a 80 y.o. [place] speaker female with medical history significant of hypertension, hyperlipidemia, diabetes mellitus type 2, hypothyroidism, and SBO presents with complaints of weakness.  Patient's daughter is present at bedside and acts interpreter for the patient.  She started having a mild intermittent cough approximately 6 days ago, in the ER diagnosed with COVID-19 pneumonia with acute hypoxic respiratory failure and admitted to the hospital.   Subjective:   Patient in bed, appears comfortable, denies any headache, no fever, no chest pain or pressure, no shortness of breath , no abdominal pain. No new focal weakness.  She has some problems swallowing food and liquids starting last night.    Assessment  & Plan :     Acute Hypoxic Resp. Failure due to Acute Covid 19 Viral Pneumonitis during the ongoing 2020 Covid 19 Pandemic - she has received 2 shots of mRNA vaccine, overall pneumonia and lung injury seem to be mild, mildly elevated inflammatory markers, she is on remdesivir, x 5 now stable and symptom-free on room air, has finished her steroid course..  Encouraged the patient to sit up in chair in the daytime use I-S and flutter valve for pulmonary toiletry.  Will advance activity and titrate down oxygen as possible.    Recent Labs  Lab 06/26/20 0401 06/26/20 0420 06/26/20 0914 06/26/20 1508 06/27/20 0158 06/28/20 0031  06/29/20 0103  WBC 4.2  --   --   --  3.5* 7.6 11.2*  HGB 12.7  --  12.9  --  12.6 11.8* 12.1  HCT 37.9  --  38.0  --  37.0 35.8* 35.8*  PLT 310  --   --   --  283 289 312  CRP  --   --   --  3.9* 3.3* 1.4* 0.9  BNP 68.9  --   --   --   --   --   --   DDIMER 1.92*  --   --   --  1.43* 1.24* 0.99*  PROCALCITON <0.10  --   --   --   --   --   --   AST 24  --   --   --  25 20 21   ALT 12  --   --   --  13 13 13   ALKPHOS 47  --   --   --  49 45 44  BILITOT 0.6  --   --   --  0.2* 0.4 0.3  ALBUMIN 3.5  --   --   --  3.1* 3.0* 3.0*  LATICACIDVEN  --  1.4  --   --   --   --   --   SARSCOV2NAA POSITIVE*  --   --   --   --   --   --      Lab Results  Component Value Date   TSH 1.17 04/09/2020     2.  Hypertensive urgency upon admission.  Currently on combination of Norvasc, beta-blocker and ACE inhibitor, added Imdur for better control will monitor. B Blocker adjusted for better control.  3.  Hypokalemia.  Replaced and stable.  4.  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  Currently compensated continue beta-blocker.  Home dose HCTZ held for now.  5.  Dehydration, hyponatremia with AKI on CKD 3.  Baseline creatinine around 1.3, has been hydrated, renal function close to baseline.  6.  Dyslipidemia.  On statin.  7.  Hyperthyroidism.  On methimazole.  TSH recently was stable on 04/09/2020 at 1.1.  8.  GERD.  On PPI.  9.  Mildly elevated D-dimer.  Likely due to inflammation from COVID-19.  Negative Leg Ven. Ultrasound.  10.  Steroid-induced Candida esophagitis causing dysphagia.  Nystatin swish and swallow, steroids have been discontinued, speech eval and follow.    11. DM type II.  Currently on sliding scale will add Lantus if needed due to IV steroids.  Will go home on Glucophage and oral hypoglycemics.  DM education.  Lab Results  Component Value Date   HGBA1C 6.7 (H) 06/16/2020   CBG (last 3)  Recent Labs    06/28/20 1641 06/28/20 2005 06/29/20 0813  GLUCAP  195* 257* 107*    Obesity: Follow with PCP Estimated body mass index is 37.2 kg/m as calculated from the following:   Height as of this encounter: 4\' 10"  (1.473 m).   Weight as of this encounter: 80.7 kg.          Condition - Fair  Family Communication  :  daughter [name] [phone] on 06/27/20, 06/28/20 @ 8.52 am message left, 06/29/20  Code Status :  Full  Consults  :  None  PUD Prophylaxis : PPI   Procedures  :     Leg [place] - No DVT  CTA - 1. Negative for pulmonary embolism. 2. 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 Recommend CTS referral. 3. 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      Disposition Plan  :    Status is: Inpatient  Remains inpatient appropriate because:IV treatments appropriate due to intensity of illness or inability to take PO  Dispo: The patient is from: Home  Anticipated d/c is to: Home              Patient currently is not medically stable to d/c.   Difficult to place patient No DVT Prophylaxis  :    enoxaparin (LOVENOX) injection 40 mg Start: 06/26/20 1000   Lab Results  Component Value Date   PLT 312 06/29/2020    Diet :  Diet Order             Diet Carb Modified Fluid consistency: Thin; Room service appropriate? No  Diet effective now                    Inpatient Medications  Scheduled Meds:  albuterol  2 puff Inhalation TID   amLODipine  10 mg Oral Daily   vitamin C  500 mg Oral Daily   calcium carbonate  500 mg of elemental calcium Oral BID WC   carvedilol  6.25 mg Oral BID WC   enoxaparin (LOVENOX) injection  40 mg Subcutaneous Q24H   gabapentin  100 mg Oral QHS   hydrALAZINE  50 mg Oral Q8H   insulin aspart  0-15 Units Subcutaneous TID WC   insulin aspart  0-5 Units Subcutaneous QHS   isosorbide mononitrate  30 mg Oral Daily   methimazole  2.5 mg Oral Daily   nystatin  5 mL Mouth/Throat QID    pantoprazole  40 mg Oral Daily   simvastatin  10 mg Oral QHS   sodium chloride flush  3 mL Intravenous Q12H   vitamin B-12  1,000 mcg Oral Daily   zinc sulfate  220 mg Oral Daily   Continuous Infusions:  remdesivir 100 mg in NS 100 mL 100 mg (06/29/20 0938)   PRN Meds:.acetaminophen, chlorpheniramine-HYDROcodone, diclofenac Sodium, guaiFENesin-dextromethorphan  Antibiotics  :    Anti-infectives (From admission, onward)    Start     Dose/Rate Route Frequency Ordered Stop   06/27/20 1000  remdesivir 100 mg in sodium chloride 0.9 % 100 mL IVPB       See Hyperspace for full Linked Orders Report.   100 mg 200 mL/hr over 30 Minutes Intravenous Daily 06/26/20 0647 07/01/20 0959   06/26/20 0700  remdesivir 200 mg in sodium chloride 0.9% 250 mL IVPB       See Hyperspace for full Linked Orders Report.   200 mg 580 mL/hr over 30 Minutes Intravenous Once 06/26/20 9798 06/26/20 0900        Time Spent in minutes  30   Lala Lund M.D on 06/29/2020 at 10:02 AM  To page go to [email or web address]   [facility] -  Office  [phone]   See all Orders from today for further details    Objective:   Vitals:   06/28/20 2226 06/28/20 2329 06/29/20 0418 06/29/20 0811  BP:  137/67 (!) 150/71 (!) 164/92  Pulse:  62 67 70  Resp:  17 20 15   Temp:  97.8 F (36.6 C) 97.9 F (36.6 C) 98.5 F (36.9 C)  TempSrc:  Axillary Axillary Oral  SpO2: 91% 92% 91% 91%  Weight:      Height:        Wt Readings from Last 3 Encounters:  06/26/20 80.7 kg  06/16/20 79.8 kg  02/04/19 74 kg     Intake/Output Summary (Last 24 hours) at 06/29/2020 1002 Last data filed at 06/29/2020 0828 Gross per 24 hour  Intake 773.75 ml  Output 3500 ml  Net -2726.25 ml     Physical Exam  Awake Alert, No new F.N deficits, Normal affect [place].AT, few white plaques in the throat  Supple Neck,No JVD, No cervical lymphadenopathy appriciated.  Symmetrical Chest wall movement, Good air movement bilaterally,  CTAB RRR,No Gallops, Rubs or new Murmurs, No Parasternal Heave +ve B.Sounds, Abd Soft, No tenderness, No organomegaly appriciated, No rebound - guarding or rigidity. No Cyanosis, Clubbing or edema, No new Rash or bruise     Data Review:    CBC Recent Labs  Lab 06/26/20 0401 06/26/20 0914 06/27/20 0158 06/28/20 0031 06/29/20 0103  WBC 4.2  --  3.5* 7.6 11.2*  HGB 12.7 12.9 12.6 11.8* 12.1  HCT 37.9 38.0 37.0 35.8* 35.8*  PLT 310  --  283 289 312  MCV 80.6  --  80.1 80.6 80.1  MCH 27.0  --  27.3 26.6 27.1  MCHC 33.5  --  34.1 33.0 33.8  RDW 14.0  --  14.0 14.0 14.1  LYMPHSABS 0.8  --  1.0 0.8 1.2  MONOABS 0.3  --  0.3 0.2 0.5  EOSABS 0.0  --  0.0 0.0 0.0  BASOSABS 0.0  --  0.0 0.0 0.0    Recent Labs  Lab 06/26/20 0401 06/26/20 0420 06/26/20 0914 06/26/20 1508 06/27/20 0158 06/28/20 0031 06/29/20 0103  NA 128*  --  129*  --  128* 130* 132*  K 3.0*  --  3.3*  --  4.0 4.1 4.6  CL 86*  --   --   --  91* 95* 93*  CO2 31  --   --   --  29 26 30   GLUCOSE 163*  --   --   --  150* 221* 165*  BUN 19  --   --   --  20 30* 30*  CREATININE 1.17*  --   --   --  1.17* 1.33* 1.34*  CALCIUM 9.2  --   --   --  9.3 8.8* 8.7*  AST 24  --   --   --  25 20 21   ALT 12  --   --   --  13 13 13   ALKPHOS 47  --   --   --  49 45 44  BILITOT 0.6  --   --   --  0.2* 0.4 0.3  ALBUMIN 3.5  --   --   --  3.1* 3.0* 3.0*  MG 1.9  --   --   --  2.0 1.9 2.1  CRP  --   --   --  3.9* 3.3* 1.4* 0.9  DDIMER 1.92*  --   --   --  1.43* 1.24* 0.99*  PROCALCITON <0.10  --   --   --   --   --   --   LATICACIDVEN  --  1.4  --   --   --   --   --   BNP 68.9  --   --   --   --   --   --     ------------------------------------------------------------------------------------------------------------------ No results for input(s): CHOL, HDL, LDLCALC, TRIG, CHOLHDL, LDLDIRECT in the last 72 hours.   Lab Results  Component Value Date   HGBA1C 6.7 (H) 06/16/2020    ------------------------------------------------------------------------------------------------------------------ No results for input(s): TSH, T4TOTAL, T3FREE, THYROIDAB in the last 72 hours.  Invalid input(s): FREET3  Cardiac Enzymes No results for input(s): CKMB, TROPONINI, MYOGLOBIN in the last 168 hours.  Invalid input(s): CK ------------------------------------------------------------------------------------------------------------------    Component Value Date/Time   BNP 68.9  06/26/2020 0401    Micro Results Recent Results (from the past 240 hour(s))  Resp Panel by RT-PCR (Flu A&B, Covid) Nasopharyngeal Swab     Status: Abnormal   Collection Time: 06/26/20  4:01 AM   Specimen: Nasopharyngeal Swab; Nasopharyngeal(NP) swabs in vial transport medium  Result Value Ref Range Status   SARS Coronavirus 2 by RT PCR POSITIVE (A) NEGATIVE Final    Comment: RESULT CALLED TO, READ BACK BY AND VERIFIED WITH: RN S WIRTZ [id number] KG 254 AM BY CM (NOTE) SARS-CoV-2 target nucleic acids are DETECTED.  The SARS-CoV-2 RNA is generally detectable in upper respiratory specimens during the acute phase of infection. Positive results are indicative of the presence of the identified virus, but do not rule out bacterial infection or co-infection with other pathogens not detected by the test. Clinical correlation with patient history and other diagnostic information is necessary to determine patient infection status. The expected result is Negative.  Fact Sheet for Patients: EntrepreneurPulse.com.au  Fact Sheet for Healthcare Providers: IncredibleEmployment.be  This test is not yet approved or cleared by the [place] FDA and  has been authorized for detection and/or diagnosis of SARS-CoV-2 by FDA under an Emergency Use Authorization (EUA).  This EUA will remain in effect (meaning this test can be  used) for the duration of  the COVID-19 declaration  under Section 564(b)(1) of the Act, 21 U.S.C. section 360bbb-3(b)(1), unless the authorization is terminated or revoked sooner.     Influenza A by PCR NEGATIVE NEGATIVE Final   Influenza B by PCR NEGATIVE NEGATIVE Final    Comment: (NOTE) The Xpert Xpress SARS-CoV-2/FLU/RSV plus assay is intended as an aid in the diagnosis of influenza from Nasopharyngeal swab specimens and should not be used as a sole basis for treatment. Nasal washings and aspirates are unacceptable for Xpert Xpress SARS-CoV-2/FLU/RSV testing.  Fact Sheet for Patients: EntrepreneurPulse.com.au  Fact Sheet for Healthcare Providers: IncredibleEmployment.be  This test is not yet approved or cleared by the [place] FDA and has been authorized for detection and/or diagnosis of SARS-CoV-2 by FDA under an Emergency Use Authorization (EUA). This EUA will remain in effect (meaning this test can be used) for the duration of the COVID-19 declaration under Section 564(b)(1) of the Act, 21 U.S.C. section 360bbb-3(b)(1), unless the authorization is terminated or revoked.  Performed at [facility], [place] [address], [place], [place] [address]   Blood Culture (routine x 2)     Status: None (Preliminary result)   Collection Time: 06/26/20  4:20 AM   Specimen: BLOOD RIGHT ARM  Result Value Ref Range Status   Specimen Description BLOOD RIGHT ARM  Final   Special Requests   Final    BOTTLES DRAWN AEROBIC AND ANAEROBIC Blood Culture adequate volume   Culture   Final    NO GROWTH 3 DAYS Performed at [facility], [place] [address] [place],  [address]    Report Status PENDING  Incomplete  MRSA Next Gen by PCR, Nasal     Status: None   Collection Time: 06/26/20  1:44 PM   Specimen: Nasal Mucosa; Nasal Swab  Result Value Ref Range Status   MRSA by PCR Next Gen NOT DETECTED NOT DETECTED Final    Comment: (NOTE) The GeneXpert MRSA Assay (FDA approved for NASAL specimens  only), is one component of a comprehensive MRSA colonization surveillance program. It is not intended to diagnose MRSA infection nor to guide or monitor treatment for MRSA infections. Test performance is  not FDA approved in patients less than 35 years old. Performed at [facility], [place] [address], [place], [place] [address]   Blood Culture (routine x 2)     Status: None (Preliminary result)   Collection Time: 06/26/20  3:08 PM   Specimen: BLOOD RIGHT HAND  Result Value Ref Range Status   Specimen Description BLOOD RIGHT HAND  Final   Special Requests   Final    BOTTLES DRAWN AEROBIC ONLY Blood Culture results may not be optimal due to an inadequate volume of blood received in culture bottles   Culture   Final    NO GROWTH 3 DAYS Performed at [facility], [place] [address], [place], [place] [address]    Report Status PENDING  Incomplete    Radiology Reports CT Head Wo Contrast  Result Date: 06/26/2020 CLINICAL DATA:  COVID positive. Productive cough and shortness of breath with dizziness. EXAM: CT HEAD WITHOUT CONTRAST TECHNIQUE: Contiguous axial images were obtained from the base of the skull through the vertex without intravenous contrast. COMPARISON:  01/31/2019 FINDINGS: Brain: No evidence of acute infarction, hemorrhage, hydrocephalus, extra-axial collection or mass lesion/mass effect. Patchy low-density in the cerebral white matter attributed to chronic small vessel ischemia. Normal brain volume Vascular: No hyperdense vessel or unexpected calcification. Skull: Normal. Negative for fracture or focal lesion. Sinuses/Orbits: No acute finding. IMPRESSION: No acute finding or change from 2021. Electronically Signed   By: Monte Fantasia M.D.   On: 06/26/2020 07:50   CT Angio Chest PE W and/or Wo Contrast  Result Date: 06/26/2020 CLINICAL DATA:  COVID positive.  Shortness of breath and cough. EXAM: CT ANGIOGRAPHY CHEST WITH CONTRAST TECHNIQUE: Multidetector CT imaging of the  chest was performed using the standard protocol during bolus administration of intravenous contrast. Multiplanar CT image reconstructions and MIPs were obtained to evaluate the vascular anatomy. CONTRAST:  34mL OMNIPAQUE IOHEXOL 350 MG/ML SOLN COMPARISON:  None. FINDINGS: Cardiovascular: Cardiomegaly. Dilated main pulmonary artery to 3.8 cm. No pericardial effusion. No acute aortic finding. No pulmonary artery filling defect. There is diffuse atheromatous calcification of the aorta. Mediastinum/Nodes: Lobulated anterior mediastinal mass measuring 8 cm craniocaudal, 6.5 cm transverse, and 2.4 cm in thickness. No invasive features or superimposed adenopathy. 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 Bilateral atelectasis or scarring. There is no edema, consolidation, effusion, or pneumothorax. Upper Abdomen: Negative Musculoskeletal: Generalized thoracic disc degeneration. Prominent bilateral glenohumeral osteoarthritis. Review of the MIP images confirms the above findings. IMPRESSION: 1. Negative for pulmonary embolism. 2. 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 Recommend CTS referral. 3. 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 Electronically Signed   By: Monte Fantasia M.D.   On: 06/26/2020 07:56   DG Chest Port 1 View  Result Date: 06/26/2020 CLINICAL DATA:  COVID pneumonia EXAM: PORTABLE CHEST 1 VIEW COMPARISON:  01/31/2019 FINDINGS: Mild bibasilar atelectasis or infiltrate. No pneumothorax. Possible small left pleural effusion. Mild cardiomegaly is stable. Pulmonary vascularity is normal. IMPRESSION: Interval development of mild bibasilar atelectasis or infiltrate. Possible small left pleural effusion. Stable cardiomegaly. Electronically Signed   By: Fidela Salisbury MD   On: 06/26/2020 04:39   VAS [place] LOWER EXTREMITY VENOUS (DVT)  Result Date: 06/28/2020  Lower  Venous DVT Study Patient Name:  KEYONTA MADRID  Date of Exam:   06/28/2020 Medical Rec #: [id number]       Accession #:    [id number] Date of Birth: 1940/10/20  Patient Gender: F Patient Age:   66Y Exam Location:  [facility] Procedure:      VAS [place] LOWER EXTREMITY VENOUS (DVT) Referring Phys: [place] [facility] --------------------------------------------------------------------------------  Indications: Covid.  Comparison Study: No prior study on file Performing Technologist: Sharion Dove RVS  Examination Guidelines: A complete evaluation includes B-mode imaging, spectral Doppler, color Doppler, and power Doppler as needed of all accessible portions of each vessel. Bilateral testing is considered an integral part of a complete examination. Limited examinations for reoccurring indications may be performed as noted. The reflux portion of the exam is performed with the patient in reverse Trendelenburg.  +---------+---------------+---------+-----------+----------+--------------+ RIGHT    CompressibilityPhasicitySpontaneityPropertiesThrombus Aging +---------+---------------+---------+-----------+----------+--------------+ CFV      Full           Yes      Yes                                 +---------+---------------+---------+-----------+----------+--------------+ SFJ      Full                                                        +---------+---------------+---------+-----------+----------+--------------+ FV Prox  Full                                                        +---------+---------------+---------+-----------+----------+--------------+ FV Mid   Full                                                        +---------+---------------+---------+-----------+----------+--------------+ FV DistalFull                                                        +---------+---------------+---------+-----------+----------+--------------+ PFV      Full                                                         +---------+---------------+---------+-----------+----------+--------------+ POP      Full           Yes      Yes                                 +---------+---------------+---------+-----------+----------+--------------+ PTV      Full                                                        +---------+---------------+---------+-----------+----------+--------------+  PERO     Full                                                        +---------+---------------+---------+-----------+----------+--------------+   +---------+---------------+---------+-----------+----------+--------------+ LEFT     CompressibilityPhasicitySpontaneityPropertiesThrombus Aging +---------+---------------+---------+-----------+----------+--------------+ CFV      Full           Yes      Yes                                 +---------+---------------+---------+-----------+----------+--------------+ SFJ      Full                                                        +---------+---------------+---------+-----------+----------+--------------+ FV Prox  Full                                                        +---------+---------------+---------+-----------+----------+--------------+ FV Mid   Full                                                        +---------+---------------+---------+-----------+----------+--------------+ FV DistalFull                                                        +---------+---------------+---------+-----------+----------+--------------+ PFV      Full                                                        +---------+---------------+---------+-----------+----------+--------------+ POP      Full           Yes      Yes                                 +---------+---------------+---------+-----------+----------+--------------+ PTV      Full                                                         +---------+---------------+---------+-----------+----------+--------------+ PERO     Full                                                        +---------+---------------+---------+-----------+----------+--------------+  Summary: BILATERAL: - No evidence of deep vein thrombosis seen in the lower extremities, bilaterally. -   *See table(s) above for measurements and observations. Electronically signed by Servando Snare MD on 06/28/2020 at 12:25:49 PM.    Final

## 2020-06-30 LAB — COMPREHENSIVE METABOLIC PANEL WITH GFR
ALT: 22 U/L (ref 0–44)
AST: 28 U/L (ref 15–41)
Albumin: 2.7 g/dL — ABNORMAL LOW (ref 3.5–5.0)
Alkaline Phosphatase: 39 U/L (ref 38–126)
Anion gap: 7 (ref 5–15)
BUN: 21 mg/dL (ref 8–23)
CO2: 30 mmol/L (ref 22–32)
Calcium: 8.1 mg/dL — ABNORMAL LOW (ref 8.9–10.3)
Chloride: 93 mmol/L — ABNORMAL LOW (ref 98–111)
Creatinine, Ser: 0.98 mg/dL (ref 0.44–1.00)
GFR, Estimated: 59 mL/min — ABNORMAL LOW (ref >60)
Glucose, Bld: 105 mg/dL — ABNORMAL HIGH (ref 70–99)
Potassium: 3.8 mmol/L (ref 3.5–5.1)
Sodium: 130 mmol/L — ABNORMAL LOW (ref 135–145)
Total Bilirubin: 0.3 mg/dL (ref 0.3–1.2)
Total Protein: 5.4 g/dL — ABNORMAL LOW (ref 6.5–8.1)

## 2020-06-30 LAB — CBC WITH DIFFERENTIAL/PLATELET
Abs Immature Granulocytes: 0.07 K/uL (ref 0.00–0.07)
Basophils Absolute: 0 K/uL (ref 0.0–0.1)
Basophils Relative: 0 %
Eosinophils Absolute: 0 K/uL (ref 0.0–0.5)
Eosinophils Relative: 0 %
HCT: 35.4 % — ABNORMAL LOW (ref 36.0–46.0)
Hemoglobin: 12.1 g/dL (ref 12.0–15.0)
Immature Granulocytes: 1 %
Lymphocytes Relative: 26 %
Lymphs Abs: 2.2 K/uL (ref 0.7–4.0)
MCH: 27.3 pg (ref 26.0–34.0)
MCHC: 34.2 g/dL (ref 30.0–36.0)
MCV: 79.7 fL — ABNORMAL LOW (ref 80.0–100.0)
Monocytes Absolute: 0.9 K/uL (ref 0.1–1.0)
Monocytes Relative: 10 %
Neutro Abs: 5.3 K/uL (ref 1.7–7.7)
Neutrophils Relative %: 63 %
Platelets: 334 K/uL (ref 150–400)
RBC: 4.44 MIL/uL (ref 3.87–5.11)
RDW: 14.5 % (ref 11.5–15.5)
WBC: 8.5 K/uL (ref 4.0–10.5)
nRBC: 0 % (ref 0.0–0.2)

## 2020-06-30 LAB — C-REACTIVE PROTEIN: CRP: 0.8 mg/dL (ref <1.0)

## 2020-06-30 LAB — D-DIMER, QUANTITATIVE: D-Dimer, Quant: 1.49 ug{FEU}/mL — ABNORMAL HIGH (ref 0.00–0.50)

## 2020-06-30 LAB — GLUCOSE, CAPILLARY
Glucose-Capillary: 116 mg/dL — ABNORMAL HIGH (ref 70–99)
Glucose-Capillary: 177 mg/dL — ABNORMAL HIGH (ref 70–99)

## 2020-06-30 LAB — MAGNESIUM: Magnesium: 1.8 mg/dL (ref 1.7–2.4)

## 2020-06-30 MED ORDER — SIMVASTATIN 20 MG PO TABS: 20.0000 mg | ORAL_TABLET | Freq: Every day | ORAL | 0 refills | Status: DC

## 2020-06-30 MED ORDER — ALUM & MAG HYDROXIDE-SIMETH 200-200-20 MG/5ML PO SUSP
30.0000 mL | Freq: Four times a day (QID) | ORAL | Status: DC | PRN
  Administered 2020-06-30: 30 mL via ORAL
  Filled 2020-06-30: qty 30

## 2020-06-30 MED ORDER — PANTOPRAZOLE SODIUM 40 MG PO TBEC: 40.0000 mg | DELAYED_RELEASE_TABLET | Freq: Every day | ORAL | 0 refills | Status: AC

## 2020-06-30 MED ORDER — NYSTATIN 100000 UNIT/ML MT SUSP: 5.0000 mL | Freq: Four times a day (QID) | OROMUCOSAL | 0 refills | Status: AC

## 2020-06-30 MED ORDER — LOPERAMIDE HCL 2 MG PO CAPS: 4.0000 mg | ORAL_CAPSULE | Freq: Four times a day (QID) | ORAL | Status: DC | PRN

## 2020-06-30 MED ORDER — HYDRALAZINE HCL 50 MG PO TABS: 50.0000 mg | ORAL_TABLET | Freq: Three times a day (TID) | ORAL | 0 refills | Status: DC

## 2020-06-30 MED ORDER — ALBUTEROL SULFATE HFA 108 (90 BASE) MCG/ACT IN AERS: 2.0000 | INHALATION_SPRAY | Freq: Four times a day (QID) | RESPIRATORY_TRACT | 0 refills | Status: AC | PRN

## 2020-06-30 MED ORDER — LOPERAMIDE HCL 2 MG PO CAPS
4.0000 mg | ORAL_CAPSULE | ORAL | Status: DC | PRN
  Administered 2020-06-30: 4 mg via ORAL
  Filled 2020-06-30: qty 2

## 2020-06-30 NOTE — Progress Notes (Signed)
06/30/2020 Late entry PT evaluation note.  See details below.      06/27/20 1735  PT Visit Information  Last PT Received On 06/27/20  Assistance Needed +1  History of Present Illness 80 y.o. female admitted on 06/26/20 for acute hypoxic respiratory failure due to acute COVID 10 viral PNA.  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  She cannot get bx done while she is currently on contact precautions.  Pt with significant PMH of aortic atherosclerosis, DM, HTN, SBO, mass on R ovary, and cataract surgery.  Precautions  Precautions Other (comment)  Precaution Comments monitor O2, does better with RW  Home Living  Family/patient expects to be discharged to: Private residence  [place]  Additional Comments Daughter, who has been interpreting, left just prior to PT arrival and we were unable to get the right [place] dialect on the phone/tele interpreter.  Pt understood quite a bit of English, I just struggled to understand her at times.  Prior Function  Level of Independence Independent with assistive device(s)  Comments It seems like she uses a RW at home  Communication  Communication Prefers language other than English  Pain Assessment  Pain Assessment Faces  Faces Pain Scale 4  Pain Location with lifting left shoulder (seems like this is chronic)  Pain Descriptors / Indicators Grimacing;Guarding  Pain Intervention(s) Limited activity within patient's tolerance;Monitored during session;Repositioned  Cognition  Arousal/Alertness Awake/alert  Behavior During Therapy WFL for tasks assessed/performed  Overall Cognitive Status Within Functional Limits for tasks assessed  General Comments As best I could tell cognition was intact  Upper Extremity Assessment  Upper Extremity Assessment LUE deficits/detail  LUE Deficits / Details seems like she may have arthritic or RTC pathology on this side, unable to lift above 90  degrees shoulder flexion and grimacing with attempt.  Lower Extremity Assessment  Lower Extremity Assessment Generalized weakness  Cervical / Trunk Assessment  Cervical / Trunk Assessment Normal  Bed Mobility  Overal bed mobility Modified Independent  Transfers  Overall transfer level Needs assistance  Equipment used Rolling walker (2 wheeled)  Transfers Sit to/from Stand  Sit to Stand Min assist;Min guard  General transfer comment Min assist without assistive device, min guard assist with RW  Ambulation/Gait  Ambulation/Gait assistance Min assist;Min guard  Gait Distance (Feet) 80 Feet (20' laps back and forth to the door x 4)  Assistive device Rolling walker (2 wheeled);1 person hand held assist  Gait Pattern/deviations Step-through pattern;Wide base of support  General Gait Details Pt reaching for support when therapist attempted to walk with only hand held assist and required min assist for gait, once handed RW, pt took off, increasing gait speed and stability min guard assist for safety due to sharp turns, lines and long gown.  Pt did desaturate with gait (see separate O2 saturation note).  Balance  Overall balance assessment Mild deficits observed, not formally tested  Exercises  Exercises General Upper Extremity;General Lower Extremity;Other exercises  General Exercises - Upper Extremity  Shoulder Flexion AROM;Right;10 reps (did not do left due to painful shoulder)  General Exercises - Lower Extremity  Hip Flexion/Marching AROM;Both;10 reps;Standing  Heel Raises AROM;Both;10 reps;Standing  Mini-Sqauts AROM;Both;10 reps;Standing  Hip ABduction/ADduction AROM;Both;10 reps;Standing  Other Exercises  Other Exercises incentive spirometer x 10 reps 750 mL max observed volume.  Difficult to cue due to language barrier, but she was at least inhaling.  Aske RN seceretary  to order a flutter valve.  PT - End of Session  Equipment Utilized During Treatment Oxygen  Activity Tolerance  Patient tolerated treatment well  Patient left in bed;with call bell/phone within reach;with bed alarm set  Nurse Communication Mobility status;Other (comment) (desaturation with gait)  PT Assessment  PT Recommendation/Assessment Patient needs continued PT services  PT Visit Diagnosis Muscle weakness (generalized) (M62.81);Difficulty in walking, not elsewhere classified (R26.2)  PT Problem List Decreased strength;Decreased activity tolerance;Decreased balance;Decreased mobility;Cardiopulmonary status limiting activity  PT Plan  PT Frequency (ACUTE ONLY) Min 3X/week  PT Treatment/Interventions (ACUTE ONLY) DME instruction;Gait training;Functional mobility training;Therapeutic activities;Therapeutic exercise;Patient/family education  AM-PAC PT "6 Clicks" Mobility Outcome Measure (Version 2)  Help needed turning from your back to your side while in a flat bed without using bedrails? 4  Help needed moving from lying on your back to sitting on the side of a flat bed without using bedrails? 4  Help needed moving to and from a bed to a chair (including a wheelchair)? 3  Help needed standing up from a chair using your arms (e.g., wheelchair or bedside chair)? 3  Help needed to walk in hospital room? 3  Help needed climbing 3-5 steps with a railing?  3  6 Click Score 20  Consider Recommendation of Discharge To: Home with no services  PT Recommendation  Follow Up Recommendations No PT follow up  PT equipment Other (comment) (possible home O2)  Individuals Consulted  Consulted and Agree with Results and Recommendations Patient  Acute Rehab PT Goals  Patient Stated Goal unable to state due to language barrier, she was excited to walk and exercise, indicating she walks and exercises regularly at home  PT Goal Formulation With patient  Time For Goal Achievement 07/11/20  Potential to Achieve Goals Good  PT Time Calculation  PT Start Time (ACUTE ONLY) 1700  PT Stop Time (ACUTE ONLY) 1730  PT Time  Calculation (min) (ACUTE ONLY) 30 min  PT General Charges  $$ ACUTE PT VISIT 1 Visit  PT Evaluation  $PT Eval Moderate Complexity 1 Mod  PT Treatments  $Gait Training 8-22 mins  Written Expression  Dominant Hand Right    Verdene Lennert, PT, DPT  Acute Rehabilitation Ortho Tech Supervisor [phone] pager [phone]) [phone] office

## 2020-06-30 NOTE — Discharge Summary (Signed)
Mandy [name] LYY:[id number] DOB: Oct 31, 1940 DOA: 06/26/2020  PCP: [name], MD  Admit date: 06/26/2020  Discharge date: 06/30/2020  Admitted From: Home  Disposition:  Home   Recommendations for Outpatient Follow-up:   Follow up with PCP in 1-2 weeks  PCP Please obtain BMP/CBC, 2 view CXR in 1week,  (see Discharge instructions)   PCP Please follow up on the following pending results: Needs outpatient thymus biopsy, review CT scan in detail.  Monitor blood pressure, renal function, A1c and lipid panel closely.   Home Health: PT, RN, SLP Equipment/Devices: None  Consultations: None  Discharge Condition: Stable    CODE STATUS: Full    Diet Recommendation: Soft    No chief complaint on file.    Brief history of present illness from the day of admission and additional interim summary    Mandy [name] is a 80 y.o. [place] speaker female with medical history significant of hypertension, hyperlipidemia, diabetes mellitus type 2, hypothyroidism, and SBO presents with complaints of weakness.  Patient's daughter is present at bedside and acts interpreter for the patient.  She started having a mild intermittent cough approximately 6 days ago, in the ER diagnosed with COVID-19 pneumonia with acute hypoxic respiratory failure and admitted to the hospital.                                                                   Hospital Course   Acute Hypoxic Resp. Failure due to Acute Covid 19 Viral Pneumonitis during the ongoing 2020 Covid 19 Pandemic - she has received 2 shots of mRNA vaccine, overall pneumonia and lung injury seem to be mild, mildly elevated inflammatory markers, she is on remdesivir, x 5 now stable and symptom-free on room air, has finished her steroid & Remdesivir course.   2.  Hypertensive  urgency upon admission.  Currently on combination of Norvasc, beta-blocker and ACE inhibitor, added Hydralazine for better control will monitor.  Follow-up with PCP for close monitoring and adjustment as needed.   3.  Hypokalemia.  Replaced and stable.   4.  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  Currently compensated continue beta-blocker.  Home dose HCTZ resumed with close PCP follow-up.   5.  Dehydration, hyponatremia with AKI on CKD 3.  Baseline creatinine around 1.3, has been hydrated, renal function back to baseline.   6.  Dyslipidemia.  On statin, dose increased for better control PCP to monitor.   7.  Hyperthyroidism.  On methimazole.  TSH recently was stable on 04/09/2020 at 1.1.   8.  GERD.  On PPI.   9.  Mildly elevated D-dimer.  Likely due to inflammation from COVID-19.  Negative Leg Ven. Ultrasound.   10.  Steroid-induced Candida esophagitis causing dysphagia.  Nystatin swish and swallow, steroids have  been discontinued, speech eval and follow.     11.  Incidental possible thymic mass on CT scan.  PCP to monitor and arrange for outpatient thymic biopsy.  Written instructions given.    12. DM type II.  Continue home regimen follow with PCP.  Discharge diagnosis     Principal Problem:   Acute respiratory failure with hypoxia (HCC) Active Problems:   Diabetes mellitus type 2, controlled (HCC)   Hyponatremia   COVID-19 virus infection   Hypertensive urgency   Hypokalemia   Generalized weakness   Dizziness   Obesity (BMI 30-39.9)   Hyperthyroidism   Chronic diastolic CHF (congestive heart failure) (HCC)   Thymic neoplasm    Discharge instructions    Discharge Instructions     Discharge instructions   Complete by: As directed    Follow with Primary MD [name], MD in 7 days, discuss your CT scan findings and need for thymus biopsy.  Get CBC, CMP, 2 view Chest X ray -  checked next visit within 1 week by Primary MD    Activity: As  tolerated with Full fall precautions use walker/cane & assistance as needed  Disposition Home   Diet: Soft diet for 4 to 5 days then advance to regular consistency heart healthy diet as tolerated.    Special Instructions: If you have smoked or chewed Tobacco  in the last 2 yrs please stop smoking, stop any regular Alcohol  and or any Recreational drug use.  On your next visit with your primary care physician please Get Medicines reviewed and adjusted.  Please request your Prim.MD to go over all Hospital Tests and Procedure/Radiological results at the follow up, please get all Hospital records sent to your Prim MD by signing hospital release before you go home.  If you experience worsening of your admission symptoms, develop shortness of breath, life threatening emergency, suicidal or homicidal thoughts you must seek medical attention immediately by calling 911 or calling your MD immediately  if symptoms less severe.  You Must read complete instructions/literature along with all the possible adverse reactions/side effects for all the Medicines you take and that have been prescribed to you. Take any new Medicines after you have completely understood and accpet all the possible adverse reactions/side effects.   MyChart COVID-19 home monitoring program   Complete by: Jun 30, 2020    Is the patient willing to use the [facility] for home monitoring?: Yes   Temperature monitoring   Complete by: Jun 30, 2020    After how many days would you like to receive a notification of this patient's flowsheet entries?: 1       Discharge Medications   Allergies as of 06/30/2020   No Known Allergies      Medication List     STOP taking these medications    furosemide 20 MG tablet Commonly known as: Lasix   zinc sulfate 220 (50 Zn) MG capsule       TAKE these medications    albuterol 108 (90 Base) MCG/ACT inhaler Commonly known as: VENTOLIN HFA Inhale 2 puffs into the lungs every 6  (six) hours as needed for wheezing or shortness of breath.   alendronate 70 MG tablet Commonly known as: FOSAMAX Take 70 mg by mouth every Monday. Take with a full glass of water on an empty stomach.   amLODipine 10 MG tablet Commonly known as: NORVASC Take 10 mg by mouth daily.   atenolol 50 MG tablet Commonly known as:  TENORMIN Take 50 mg by mouth 2 (two) times daily.   benazepril-hydrochlorthiazide 20-25 MG tablet Commonly known as: LOTENSIN HCT Take 1 tablet by mouth daily.   calcium carbonate 1500 (600 Ca) MG Tabs tablet Commonly known as: OSCAL Take 600 mg of elemental calcium by mouth 2 (two) times daily with a meal.   diclofenac sodium 1 % Gel Commonly known as: VOLTAREN Apply 4 g topically 4 (four) times daily as needed for pain.   gabapentin 100 MG capsule Commonly known as: NEURONTIN Take 100 mg by mouth at bedtime.   guaiFENesin-dextromethorphan 100-10 MG/5ML syrup Commonly known as: ROBITUSSIN DM Take 10 mLs by mouth every 4 (four) hours as needed for cough.   hydrALAZINE 50 MG tablet Commonly known as: APRESOLINE Take 1 tablet (50 mg total) by mouth every 8 (eight) hours.   metFORMIN 500 MG tablet Commonly known as: GLUCOPHAGE Take 500 mg by mouth 2 (two) times daily with a meal.   methimazole 5 MG tablet Commonly known as: TAPAZOLE Take 0.5 tablets (2.5 mg total) by mouth daily.   multivitamin with minerals Tabs tablet Take 1 tablet by mouth daily.   nystatin 100000 UNIT/ML suspension Commonly known as: MYCOSTATIN Use as directed 5 mLs (500,000 Units total) in the mouth or throat 4 (four) times daily for 28 doses.   pantoprazole 40 MG tablet Commonly known as: PROTONIX Take 1 tablet (40 mg total) by mouth daily at 6 (six) AM.   simvastatin 10 MG tablet Commonly known as: ZOCOR Take 10 mg by mouth at bedtime.   THERAFLU SEVERE COLD & COUGH PO Take 1 packet by mouth every 6 (six) hours as needed (cold/cough).   tiZANidine 4 MG  tablet Commonly known as: ZANAFLEX Take 4 mg by mouth 2 (two) times daily.   vitamin B-12 1000 MCG tablet Commonly known as: CYANOCOBALAMIN Take 1,000 mcg by mouth daily.   vitamin C 500 MG tablet Commonly known as: ASCORBIC ACID Take 500 mg by mouth daily.   Vitamin D (Cholecalciferol) 25 MCG (1000 UT) Tabs Take 1,000 Units by mouth daily.   zinc gluconate 50 MG tablet Take 50 mg by mouth daily.       ASK your doctor about these medications    aspirin EC 81 MG tablet Take 81 mg by mouth daily. Swallow whole.         Follow-up Information     [name], MD. Schedule an appointment as soon as possible for a visit in 1 week(s).   Specialty: Internal Medicine Why: review your CT repeat, you need Thymus biopsy done Contact information: [place] [address] [phone]         [name], MD. Schedule an appointment as soon as possible for a visit in 1 week(s).   Specialty: Cardiothoracic Surgery Why: Thymus mass Contact information: [employer] [place] [address] [phone]                 Major procedures and Radiology Reports - PLEASE review detailed and final reports thoroughly  -       DG Abd 1 View  Result Date: 06/29/2020 CLINICAL DATA:  Nausea and vomiting EXAM: ABDOMEN - 1 VIEW COMPARISON:  10/09/2018 FINDINGS: Bowel-gas pattern is within normal limits. No radiopaque calculi. No acute osseous abnormality. IMPRESSION: Normal bowel gas pattern. Electronically Signed   By: Macy Mis M.D.   On: 06/29/2020 12:05   CT Head Wo Contrast  Result Date: 06/26/2020 CLINICAL  DATA:  COVID positive. Productive cough and shortness of breath with dizziness. EXAM: CT HEAD WITHOUT CONTRAST TECHNIQUE: Contiguous axial images were obtained from the base of the skull through the vertex without intravenous contrast. COMPARISON:  01/31/2019 FINDINGS: Brain: No evidence of acute infarction, hemorrhage,  hydrocephalus, extra-axial collection or mass lesion/mass effect. Patchy low-density in the cerebral white matter attributed to chronic small vessel ischemia. Normal brain volume Vascular: No hyperdense vessel or unexpected calcification. Skull: Normal. Negative for fracture or focal lesion. Sinuses/Orbits: No acute finding. IMPRESSION: No acute finding or change from 2021. Electronically Signed   By: Monte Fantasia M.D.   On: 06/26/2020 07:50   CT Angio Chest PE W and/or Wo Contrast  Result Date: 06/26/2020 CLINICAL DATA:  COVID positive.  Shortness of breath and cough. EXAM: CT ANGIOGRAPHY CHEST WITH CONTRAST TECHNIQUE: Multidetector CT imaging of the chest was performed using the standard protocol during bolus administration of intravenous contrast. Multiplanar CT image reconstructions and MIPs were obtained to evaluate the vascular anatomy. CONTRAST:  30mL OMNIPAQUE IOHEXOL 350 MG/ML SOLN COMPARISON:  None. FINDINGS: Cardiovascular: Cardiomegaly. Dilated main pulmonary artery to 3.8 cm. No pericardial effusion. No acute aortic finding. No pulmonary artery filling defect. There is diffuse atheromatous calcification of the aorta. Mediastinum/Nodes: Lobulated anterior mediastinal mass measuring 8 cm craniocaudal, 6.5 cm transverse, and 2.4 cm in thickness. No invasive features or superimposed adenopathy. 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 Bilateral atelectasis or scarring. There is no edema, consolidation, effusion, or pneumothorax. Upper Abdomen: Negative Musculoskeletal: Generalized thoracic disc degeneration. Prominent bilateral glenohumeral osteoarthritis. Review of the MIP images confirms the above findings. IMPRESSION: 1. Negative for pulmonary embolism. 2. 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 Recommend CTS referral. 3. 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 Electronically Signed   By: Monte Fantasia M.D.   On: 06/26/2020 07:56   DG Chest Port 1 View  Result Date: 06/26/2020 CLINICAL DATA:  COVID pneumonia EXAM: PORTABLE CHEST 1 VIEW COMPARISON:  01/31/2019 FINDINGS: Mild bibasilar atelectasis or infiltrate. No pneumothorax. Possible small left pleural effusion. Mild cardiomegaly is stable. Pulmonary vascularity is normal. IMPRESSION: Interval development of mild bibasilar atelectasis or infiltrate. Possible small left pleural effusion. Stable cardiomegaly. Electronically Signed   By: Fidela Salisbury MD   On: 06/26/2020 04:39   VAS [place] LOWER EXTREMITY VENOUS (DVT)  Result Date: 06/28/2020  Lower Venous DVT Study Patient Name:  Mandy [name]  Date of Exam:   06/28/2020 Medical Rec #: [id number]       Accession #:    [id number] Date of Birth: [birth year]        Patient Gender: F Patient Age:   79Y Exam Location:  [facility] Procedure:      VAS [place] LOWER EXTREMITY VENOUS (DVT) Referring Phys: [facility] --------------------------------------------------------------------------------  Indications: Covid.  Comparison Study: No prior study on file Performing Technologist: Sharion Dove RVS  Examination Guidelines: A complete evaluation includes B-mode imaging, spectral Doppler, color Doppler, and power Doppler as needed of all accessible portions of each vessel. Bilateral testing is considered an integral part of a complete examination. Limited examinations for reoccurring indications may be performed as noted. The reflux portion of the exam is performed with the patient in reverse Trendelenburg.  +---------+---------------+---------+-----------+----------+--------------+ RIGHT    CompressibilityPhasicitySpontaneityPropertiesThrombus Aging +---------+---------------+---------+-----------+----------+--------------+ CFV      Full           Yes  Yes                                  +---------+---------------+---------+-----------+----------+--------------+ SFJ      Full                                                        +---------+---------------+---------+-----------+----------+--------------+ FV Prox  Full                                                        +---------+---------------+---------+-----------+----------+--------------+ FV Mid   Full                                                        +---------+---------------+---------+-----------+----------+--------------+ FV DistalFull                                                        +---------+---------------+---------+-----------+----------+--------------+ PFV      Full                                                        +---------+---------------+---------+-----------+----------+--------------+ POP      Full           Yes      Yes                                 +---------+---------------+---------+-----------+----------+--------------+ PTV      Full                                                        +---------+---------------+---------+-----------+----------+--------------+ PERO     Full                                                        +---------+---------------+---------+-----------+----------+--------------+   +---------+---------------+---------+-----------+----------+--------------+ LEFT     CompressibilityPhasicitySpontaneityPropertiesThrombus Aging +---------+---------------+---------+-----------+----------+--------------+ CFV      Full           Yes      Yes                                 +---------+---------------+---------+-----------+----------+--------------+ SFJ      Full                                                        +---------+---------------+---------+-----------+----------+--------------+  FV Prox  Full                                                         +---------+---------------+---------+-----------+----------+--------------+ FV Mid   Full                                                        +---------+---------------+---------+-----------+----------+--------------+ FV DistalFull                                                        +---------+---------------+---------+-----------+----------+--------------+ PFV      Full                                                        +---------+---------------+---------+-----------+----------+--------------+ POP      Full           Yes      Yes                                 +---------+---------------+---------+-----------+----------+--------------+ PTV      Full                                                        +---------+---------------+---------+-----------+----------+--------------+ PERO     Full                                                        +---------+---------------+---------+-----------+----------+--------------+     Summary: BILATERAL: - No evidence of deep vein thrombosis seen in the lower extremities, bilaterally. -   *See table(s) above for measurements and observations. Electronically signed by Servando Snare MD on 06/28/2020 at 12:25:49 PM.    Final     Micro Results    Recent Results (from the past 240 hour(s))  Resp Panel by RT-PCR (Flu A&B, Covid) Nasopharyngeal Swab     Status: Abnormal   Collection Time: 06/26/20  4:01 AM   Specimen: Nasopharyngeal Swab; Nasopharyngeal(NP) swabs in vial transport medium  Result Value Ref Range Status   SARS Coronavirus 2 by RT PCR POSITIVE (A) NEGATIVE Final    Comment: RESULT CALLED TO, READ BACK BY AND VERIFIED WITH: RN S WIRTZ [id number] 295 AM BY CM (NOTE) SARS-CoV-2 target nucleic acids are DETECTED.  The SARS-CoV-2 RNA is generally detectable in upper respiratory specimens during the acute phase of infection. Positive results are indicative of the presence of the identified virus, but do not  rule  out bacterial infection or co-infection with other pathogens not detected by the test. Clinical correlation with patient history and other diagnostic information is necessary to determine patient infection status. The expected result is Negative.  Fact Sheet for Patients: EntrepreneurPulse.com.au  Fact Sheet for Healthcare Providers: IncredibleEmployment.be  This test is not yet approved or cleared by the [place] FDA and  has been authorized for detection and/or diagnosis of SARS-CoV-2 by FDA under an Emergency Use Authorization (EUA).  This EUA will remain in effect (meaning this test can be  used) for the duration of  the COVID-19 declaration under Section 564(b)(1) of the Act, 21 U.S.C. section 360bbb-3(b)(1), unless the authorization is terminated or revoked sooner.     Influenza A by PCR NEGATIVE NEGATIVE Final   Influenza B by PCR NEGATIVE NEGATIVE Final    Comment: (NOTE) The Xpert Xpress SARS-CoV-2/FLU/RSV plus assay is intended as an aid in the diagnosis of influenza from Nasopharyngeal swab specimens and should not be used as a sole basis for treatment. Nasal washings and aspirates are unacceptable for Xpert Xpress SARS-CoV-2/FLU/RSV testing.  Fact Sheet for Patients: EntrepreneurPulse.com.au  Fact Sheet for Healthcare Providers: IncredibleEmployment.be  This test is not yet approved or cleared by the [place] FDA and has been authorized for detection and/or diagnosis of SARS-CoV-2 by FDA under an Emergency Use Authorization (EUA). This EUA will remain in effect (meaning this test can be used) for the duration of the COVID-19 declaration under Section 564(b)(1) of the Act, 21 U.S.C. section 360bbb-3(b)(1), unless the authorization is terminated or revoked.  Performed at [facility], [place] [address], [place], [place] [address]   Blood Culture (routine x 2)     Status:  None (Preliminary result)   Collection Time: 06/26/20  4:20 AM   Specimen: BLOOD RIGHT ARM  Result Value Ref Range Status   Specimen Description BLOOD RIGHT ARM  Final   Special Requests   Final    BOTTLES DRAWN AEROBIC AND ANAEROBIC Blood Culture adequate volume   Culture   Final    NO GROWTH 4 DAYS Performed at [facility], [place] [address], [place], [place] [address]    Report Status PENDING  Incomplete  MRSA Next Gen by PCR, Nasal     Status: None   Collection Time: 06/26/20  1:44 PM   Specimen: Nasal Mucosa; Nasal Swab  Result Value Ref Range Status   MRSA by PCR Next Gen NOT DETECTED NOT DETECTED Final    Comment: (NOTE) The GeneXpert MRSA Assay (FDA approved for NASAL specimens only), is one component of a comprehensive MRSA colonization surveillance program. It is not intended to diagnose MRSA infection nor to guide or monitor treatment for MRSA infections. Test performance is not FDA approved in patients less than 57 years old. Performed at [facility], [place] [address] [place], [place] [address]   Blood Culture (routine x 2)     Status: None (Preliminary result)   Collection Time: 06/26/20  3:08 PM   Specimen: BLOOD RIGHT HAND  Result Value Ref Range Status   Specimen Description BLOOD RIGHT HAND  Final   Special Requests   Final    BOTTLES DRAWN AEROBIC ONLY Blood Culture results may not be optimal due to an inadequate volume of blood received in culture bottles   Culture   Final    NO GROWTH 4 DAYS Performed at [facility], [place] [address], [place], [place] [address]    Report  Status PENDING  Incomplete    Today   Subjective    Mandy [name] today has no headache,no chest abdominal pain,no new weakness tingling or numbness, feels much better wants to go home today.   Objective   Blood pressure 125/79, pulse 97, temperature 99.5 F (37.5 C), temperature source Oral, resp. rate 20, height 4\' 10"  (1.473 m), weight 80.7 kg, SpO2 92  %.   Intake/Output Summary (Last 24 hours) at 06/30/2020 0931 Last data filed at 06/30/2020 0600 Gross per 24 hour  Intake 480 ml  Output 600 ml  Net -120 ml    Exam  Awake Alert, No new F.N deficits, Normal affect House.AT,PERRAL Supple Neck,No JVD, No cervical lymphadenopathy appriciated.  Symmetrical Chest wall movement, Good air movement bilaterally, CTAB RRR,No Gallops,Rubs or new Murmurs, No Parasternal Heave +ve B.Sounds, Abd Soft, Non tender, No organomegaly appriciated, No rebound -guarding or rigidity. No Cyanosis, Clubbing or edema, No new Rash or bruise   Data Review   CBC w Diff:  Lab Results  Component Value Date   WBC 8.5 06/30/2020   HGB 12.1 06/30/2020   HCT 35.4 (L) 06/30/2020   HCT 33.2 (L) 02/03/2019   PLT 334 06/30/2020   LYMPHOPCT 26 06/30/2020   MONOPCT 10 06/30/2020   EOSPCT 0 06/30/2020   BASOPCT 0 06/30/2020    CMP:  Lab Results  Component Value Date   NA 130 (L) 06/30/2020   K 3.8 06/30/2020   CL 93 (L) 06/30/2020   CO2 30 06/30/2020   BUN 21 06/30/2020   CREATININE 0.98 06/30/2020   CREATININE 1.32 (H) 05/21/2020   PROT 5.4 (L) 06/30/2020   ALBUMIN 2.7 (L) 06/30/2020   BILITOT 0.3 06/30/2020   ALKPHOS 39 06/30/2020   AST 28 06/30/2020   ALT 22 06/30/2020  . Lab Results  Component Value Date   HGBA1C 6.7 (H) 06/16/2020   Lab Results  Component Value Date   CHOL 204 (H) 04/09/2020   HDL 65 04/09/2020   LDLCALC 114 (H) 04/09/2020   TRIG 57 06/26/2020   CHOLHDL 3.1 04/09/2020     Total Time in preparing paper work, data evaluation and todays exam - 69 minutes  Lala Lund M.D on 06/30/2020 at 9:31 AM  [facility]

## 2020-06-30 NOTE — Plan of Care (Signed)

## 2020-06-30 NOTE — Progress Notes (Signed)
Patient discharged from facility. All discharge instructions reviewed with daughter at bedside. Daughter able to verbalize and teach back understanding. IV site removed, catheter intact, pressure dressing applied. Central tele notified. All patient belongings in disposition of the patient. Denies any further needs at this time.

## 2020-06-30 NOTE — Discharge Instructions (Addendum)
Follow with Primary MD [name], MD in 7 days, discuss your CT scan findings and need for thymus biopsy.   Get CBC, CMP, 2 view Chest X ray -  checked next visit within 1 week by Primary MD    Activity: As tolerated with Full fall precautions use walker/cane & assistance as needed  Disposition Home   Diet: Soft diet for 4 to 5 days then advance to regular consistency heart healthy diet as tolerated.    Special Instructions: If you have smoked or chewed Tobacco  in the last 2 yrs please stop smoking, stop any regular Alcohol  and or any Recreational drug use.  On your next visit with your primary care physician please Get Medicines reviewed and adjusted.  Please request your Prim.MD to go over all Hospital Tests and Procedure/Radiological results at the follow up, please get all Hospital records sent to your Prim MD by signing hospital release before you go home.  If you experience worsening of your admission symptoms, develop shortness of breath, life threatening emergency, suicidal or homicidal thoughts you must seek medical attention immediately by calling 911 or calling your MD immediately  if symptoms less severe.  You Must read complete instructions/literature along with all the possible adverse reactions/side effects for all the Medicines you take and that have been prescribed to you. Take any new Medicines after you have completely understood and accpet all the possible adverse reactions/side effects.

## 2020-06-30 NOTE — Evaluation (Signed)
SLP Cancellation Note  Patient Details Name: Mandy [name] MRN: [id number] DOB: 09/10/1940   Cancelled treatment:       Reason Eval/Treat Not Completed: Other (comment) (daughter not present at this time and interpreter IPAD did not have pt's language despite many efforts for swallow evaluation, will ask RN to reach out to SLP when pt's daughter in available to interpret)  Kathleen Lime, [facility], [facility] SLP [place] [phone] Pager [phone]  Macario Golds 06/30/2020, 8:44 AM

## 2020-07-01 LAB — CULTURE, BLOOD (ROUTINE X 2)
Culture: NO GROWTH
Culture: NO GROWTH
Special Requests: ADEQUATE

## 2020-07-07 ENCOUNTER — Other Ambulatory Visit: Payer: Self-pay | Admitting: Internal Medicine

## 2020-07-08 LAB — COMPLETE METABOLIC PANEL WITH GFR
AG Ratio: 1.5 (calc) (ref 1.0–2.5)
ALT: 9 U/L (ref 6–29)
AST: 13 U/L (ref 10–35)
Albumin: 3.8 g/dL (ref 3.6–5.1)
Alkaline phosphatase (APISO): 43 U/L (ref 37–153)
BUN/Creatinine Ratio: 14 (calc) (ref 6–22)
BUN: 17 mg/dL (ref 7–25)
CO2: 30 mmol/L (ref 20–32)
Calcium: 9.3 mg/dL (ref 8.6–10.4)
Chloride: 93 mmol/L — ABNORMAL LOW (ref 98–110)
Creat: 1.19 mg/dL — ABNORMAL HIGH (ref 0.60–0.93)
GFR, Est African American: 50 mL/min/{1.73_m2} — ABNORMAL LOW (ref >=60)
GFR, Est Non African American: 43 mL/min/{1.73_m2} — ABNORMAL LOW (ref >=60)
Globulin: 2.6 g/dL (calc) (ref 1.9–3.7)
Glucose, Bld: 89 mg/dL (ref 65–99)
Potassium: 3.8 mmol/L (ref 3.5–5.3)
Sodium: 131 mmol/L — ABNORMAL LOW (ref 135–146)
Total Bilirubin: 0.3 mg/dL (ref 0.2–1.2)
Total Protein: 6.4 g/dL (ref 6.1–8.1)

## 2020-07-08 LAB — CBC WITH DIFFERENTIAL/PLATELET
Absolute Monocytes: 747 cells/uL (ref 200–950)
Basophils Absolute: 30 cells/uL (ref 0–200)
Basophils Relative: 0.4 %
Eosinophils Absolute: 74 cells/uL (ref 15–500)
Eosinophils Relative: 1 %
HCT: 36.7 % (ref 35.0–45.0)
Hemoglobin: 11.8 g/dL (ref 11.7–15.5)
Lymphs Abs: 1739 cells/uL (ref 850–3900)
MCH: 27.2 pg (ref 27.0–33.0)
MCHC: 32.2 g/dL (ref 32.0–36.0)
MCV: 84.6 fL (ref 80.0–100.0)
MPV: 10.2 fL (ref 7.5–12.5)
Monocytes Relative: 10.1 %
Neutro Abs: 4810 cells/uL (ref 1500–7800)
Neutrophils Relative %: 65 %
Platelets: 403 10*3/uL — ABNORMAL HIGH (ref 140–400)
RBC: 4.34 10*6/uL (ref 3.80–5.10)
RDW: 14.3 % (ref 11.0–15.0)
Total Lymphocyte: 23.5 %
WBC: 7.4 10*3/uL (ref 3.8–10.8)

## 2020-07-08 LAB — MAGNESIUM: Magnesium: 2 mg/dL (ref 1.5–2.5)

## 2020-07-21 ENCOUNTER — Encounter: Payer: Medicare HMO | Admitting: Thoracic Surgery (Cardiothoracic Vascular Surgery)

## 2020-07-27 ENCOUNTER — Other Ambulatory Visit: Payer: Self-pay

## 2020-07-27 ENCOUNTER — Other Ambulatory Visit: Payer: Self-pay | Admitting: Thoracic Surgery (Cardiothoracic Vascular Surgery)

## 2020-07-27 ENCOUNTER — Encounter: Payer: Self-pay | Admitting: Thoracic Surgery (Cardiothoracic Vascular Surgery)

## 2020-07-27 ENCOUNTER — Institutional Professional Consult (permissible substitution) (INDEPENDENT_AMBULATORY_CARE_PROVIDER_SITE_OTHER): Payer: Medicare HMO | Admitting: Thoracic Surgery (Cardiothoracic Vascular Surgery)

## 2020-07-27 VITALS — BP 165/87 | HR 91 | Resp 20 | Ht <= 58 in | Wt 176.4 lb

## 2020-07-27 DIAGNOSIS — D4989 Neoplasm of unspecified behavior of other specified sites: Secondary | ICD-10-CM

## 2020-07-27 NOTE — Progress Notes (Signed)
[address]       [place] [address]             [phone]                    Sade Bilodeau Ward Medical Record [id number] Date of Birth: [birth year]  Referring: Bernerd Limbo, MD Primary Care: [name], MD Primary Cardiologist: None  Chief Complaint:    Chief Complaint  Patient presents with   Thymic Mass    New patient consultation CTA 06/26/20    History of Present Illness:    Mandy [name] 80 y.o. female referred for surgical evaluation of a anterior mediastinal mass that was found incidentally when she was admitted to the hospital for treatment of COVID-pneumonia.  Much of the information was provided by her daughter due to language barrier.  The patient is from [place], and only speaks [place].  Unfortunately she does not understand much [place].  Symptomatically she only complains of left shoulder pain which she was in the process of being worked up for.      Past Medical History:  Diagnosis Date   Aortic atherosclerosis ([place]) 10/08/2018   Diabetes mellitus without complication ([place])    Hepatic steatosis 10/08/2018   Hyperlipidemia    Hypertension    Hyperthyroidism    Mass of right ovary 10/08/2018   Osteoporosis    SBO (small bowel obstruction) ([place]) 08/09/2018   SBO (small bowel obstruction) ([place]) 08/09/2018    Past Surgical History:  Procedure Laterality Date   CATARACT EXTRACTION W/ INTRAOCULAR LENS  IMPLANT, BILATERAL     COLONOSCOPY      Family History  Problem Relation Age of Onset   Kidney disease Son    Colon cancer Neg Hx    Breast cancer Neg Hx      Social History   Tobacco Use  Smoking Status Never  Smokeless Tobacco Never    Social History   Substance and Sexual Activity  Alcohol Use No     No Known Allergies  Current Outpatient Medications  Medication Sig Dispense Refill   albuterol (VENTOLIN HFA) 108 (90 Base) MCG/ACT inhaler Inhale 2 puffs into the lungs every 6 (six) hours  as needed for wheezing or shortness of breath. 18 g 0   alendronate (FOSAMAX) 70 MG tablet Take 70 mg by mouth every Monday. Take with a full glass of water on an empty stomach.     amLODipine (NORVASC) 10 MG tablet Take 10 mg by mouth daily.     aspirin EC 81 MG tablet Take 81 mg by mouth daily. Swallow whole.     atenolol (TENORMIN) 50 MG tablet Take 50 mg by mouth 2 (two) times daily.     benazepril-hydrochlorthiazide (LOTENSIN HCT) 20-25 MG tablet Take 1 tablet by mouth daily.     calcium carbonate (OSCAL) 1500 (600 Ca) MG TABS tablet Take 600 mg of elemental calcium by mouth 2 (two) times daily with a meal.     diclofenac sodium (VOLTAREN) 1 % GEL Apply 4 g topically 4 (four) times daily as needed for pain.     gabapentin (NEURONTIN) 100 MG capsule Take 100 mg by mouth at bedtime.     guaiFENesin-dextromethorphan (ROBITUSSIN DM) 100-10 MG/5ML syrup Take 10 mLs by mouth every 4 (four) hours as needed for cough. 118 mL 0   hydrALAZINE (APRESOLINE) 50 MG tablet Take 1 tablet (50 mg total) by mouth every 8 (eight) hours.  90 tablet 0   metFORMIN (GLUCOPHAGE) 500 MG tablet Take 500 mg by mouth 2 (two) times daily with a meal.      methimazole (TAPAZOLE) 5 MG tablet Take 0.5 tablets (2.5 mg total) by mouth daily. 30 tablet 0   Multiple Vitamin (MULTIVITAMIN WITH MINERALS) TABS tablet Take 1 tablet by mouth daily.     pantoprazole (PROTONIX) 40 MG tablet Take 1 tablet (40 mg total) by mouth daily at 6 (six) AM. 30 tablet 0   Phenyleph-Diphenhyd-DM-APAP (THERAFLU SEVERE COLD & COUGH PO) Take 1 packet by mouth every 6 (six) hours as needed (cold/cough).     simvastatin (ZOCOR) 20 MG tablet Take 1 tablet (20 mg total) by mouth at bedtime. 30 tablet 0   tiZANidine (ZANAFLEX) 4 MG tablet Take 4 mg by mouth 2 (two) times daily.      vitamin B-12 (CYANOCOBALAMIN) 1000 MCG tablet Take 1,000 mcg by mouth daily.     vitamin C (ASCORBIC ACID) 500 MG tablet Take 500 mg by mouth daily.     Vitamin D,  Cholecalciferol, 25 MCG (1000 UT) TABS Take 1,000 Units by mouth daily.     zinc gluconate 50 MG tablet Take 50 mg by mouth daily.     No current facility-administered medications for this visit.    Review of Systems  Unable to perform ROS: Language    PHYSICAL EXAMINATION: BP (!) 165/87 (BP Location: Right Arm, Patient Position: Sitting, Cuff Size: Large)   Pulse 91   Resp 20   Ht '4\' 10"'$  (1.473 m)   Wt 176 lb 6.4 oz (80 kg)   SpO2 95% Comment: RA  BMI 36.87 kg/m  Physical Exam Constitutional:      General: She is not in acute distress.    Appearance: She is ill-appearing.     Comments: Frail. Very slow gait.  HENT:     Head: Normocephalic and atraumatic.  Eyes:     Extraocular Movements: Extraocular movements intact.  Cardiovascular:     Rate and Rhythm: Normal rate.  Pulmonary:     Effort: Pulmonary effort is normal.  Abdominal:     General: There is no distension.  Musculoskeletal:     Cervical back: Normal range of motion.  Neurological:     General: No focal deficit present.     Mental Status: She is alert.    Diagnostic Studies & Laboratory data:     Recent Radiology Findings:   DG Abd 1 View  Result Date: 06/29/2020 CLINICAL DATA:  Nausea and vomiting EXAM: ABDOMEN - 1 VIEW COMPARISON:  10/09/2018 FINDINGS: Bowel-gas pattern is within normal limits. No radiopaque calculi. No acute osseous abnormality. IMPRESSION: Normal bowel gas pattern. Electronically Signed   By: Macy Mis M.D.   On: 06/29/2020 12:05   VAS [place] LOWER EXTREMITY VENOUS (DVT)  Result Date: 06/28/2020  Lower Venous DVT Study Patient Name:  Mandy [name]  Date of Exam:   06/28/2020 Medical Rec #: [id number]       Accession #:    [id number] Date of Birth: 23-Jan-1940        Patient Gender: F Patient Age:   75Y Exam Location:  [facility] Procedure:      VAS [place] LOWER EXTREMITY VENOUS (DVT) Referring Phys: [facility]  --------------------------------------------------------------------------------  Indications: Covid.  Comparison Study: No prior study on file Performing Technologist: Sharion Dove RVS  Examination Guidelines: A complete evaluation includes B-mode imaging, spectral Doppler, color Doppler, and power Doppler as needed  of all accessible portions of each vessel. Bilateral testing is considered an integral part of a complete examination. Limited examinations for reoccurring indications may be performed as noted. The reflux portion of the exam is performed with the patient in reverse Trendelenburg.  +---------+---------------+---------+-----------+----------+--------------+ RIGHT    CompressibilityPhasicitySpontaneityPropertiesThrombus Aging +---------+---------------+---------+-----------+----------+--------------+ CFV      Full           Yes      Yes                                 +---------+---------------+---------+-----------+----------+--------------+ SFJ      Full                                                        +---------+---------------+---------+-----------+----------+--------------+ FV Prox  Full                                                        +---------+---------------+---------+-----------+----------+--------------+ FV Mid   Full                                                        +---------+---------------+---------+-----------+----------+--------------+ FV DistalFull                                                        +---------+---------------+---------+-----------+----------+--------------+ PFV      Full                                                        +---------+---------------+---------+-----------+----------+--------------+ POP      Full           Yes      Yes                                 +---------+---------------+---------+-----------+----------+--------------+ PTV      Full                                                         +---------+---------------+---------+-----------+----------+--------------+ PERO     Full                                                        +---------+---------------+---------+-----------+----------+--------------+   +---------+---------------+---------+-----------+----------+--------------+ LEFT  CompressibilityPhasicitySpontaneityPropertiesThrombus Aging +---------+---------------+---------+-----------+----------+--------------+ CFV      Full           Yes      Yes                                 +---------+---------------+---------+-----------+----------+--------------+ SFJ      Full                                                        +---------+---------------+---------+-----------+----------+--------------+ FV Prox  Full                                                        +---------+---------------+---------+-----------+----------+--------------+ FV Mid   Full                                                        +---------+---------------+---------+-----------+----------+--------------+ FV DistalFull                                                        +---------+---------------+---------+-----------+----------+--------------+ PFV      Full                                                        +---------+---------------+---------+-----------+----------+--------------+ POP      Full           Yes      Yes                                 +---------+---------------+---------+-----------+----------+--------------+ PTV      Full                                                        +---------+---------------+---------+-----------+----------+--------------+ PERO     Full                                                        +---------+---------------+---------+-----------+----------+--------------+     Summary: BILATERAL: - No evidence of deep vein thrombosis seen in the lower extremities,  bilaterally. -   *See table(s) above for measurements and observations. Electronically signed by Servando Snare MD on 06/28/2020 at 12:25:49 PM.    Final        I  have independently reviewed the above radiology studies  and reviewed the findings with the patient.   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  On exam today she appears quite frail and has a very slow gait.  She has recovered from 2 episodes of COVID-pneumonia, but given her age and her functional status I am not sure if she would be a good candidate if this turns out to be an operable mass.  I have ordered labs, as well as a PET/CT for further evaluation.  Will be imperative to have an interpreter at the next visit so that we can have a more in-depth conversation.  I will see her back once all the studies have resulted.     I  spent 40 minutes with  the patient face to face in counseling and coordination of care.    [name] 07/27/2020 4:36 PM

## 2020-08-10 LAB — HCG, QUANTITATIVE, PREGNANCY: HCG, Total, QN: <3 m[IU]/mL

## 2020-08-10 LAB — LACTATE DEHYDROGENASE: LDH: 129 U/L (ref 120–250)

## 2020-08-10 LAB — AFP TUMOR MARKER: AFP-Tumor Marker: 2.8 ng/mL

## 2020-08-11 ENCOUNTER — Other Ambulatory Visit: Payer: Self-pay

## 2020-08-11 ENCOUNTER — Ambulatory Visit (HOSPITAL_COMMUNITY)
Admission: RE | Admit: 2020-08-11 | Discharge: 2020-08-11 | Disposition: A | Discharge status: Home / Self Care | Payer: Medicare HMO | Source: Ambulatory Visit | Attending: Thoracic Surgery (Cardiothoracic Vascular Surgery) | Admitting: Thoracic Surgery (Cardiothoracic Vascular Surgery)

## 2020-08-11 DIAGNOSIS — E041 Nontoxic single thyroid nodule: Secondary | ICD-10-CM | POA: Diagnosis not present

## 2020-08-11 DIAGNOSIS — R222 Localized swelling, mass and lump, trunk: Secondary | ICD-10-CM | POA: Insufficient documentation

## 2020-08-11 DIAGNOSIS — D4989 Neoplasm of unspecified behavior of other specified sites: Secondary | ICD-10-CM | POA: Diagnosis present

## 2020-08-11 DIAGNOSIS — I700 Atherosclerosis of aorta: Secondary | ICD-10-CM | POA: Diagnosis not present

## 2020-08-11 DIAGNOSIS — D259 Leiomyoma of uterus, unspecified: Secondary | ICD-10-CM | POA: Diagnosis not present

## 2020-08-11 DIAGNOSIS — D384 Neoplasm of uncertain behavior of thymus: Secondary | ICD-10-CM | POA: Insufficient documentation

## 2020-08-11 LAB — GLUCOSE, CAPILLARY: Glucose-Capillary: 134 mg/dL — ABNORMAL HIGH (ref 70–99)

## 2020-08-11 MED ORDER — FLUDEOXYGLUCOSE F - 18 (FDG) INJECTION
8.1000 | Freq: Once | INTRAVENOUS | Status: AC | PRN
  Administered 2020-08-11: 8.4 via INTRAVENOUS

## 2020-08-14 ENCOUNTER — Other Ambulatory Visit: Payer: Self-pay

## 2020-08-14 ENCOUNTER — Encounter: Payer: Medicare HMO | Admitting: Thoracic Surgery (Cardiothoracic Vascular Surgery)

## 2020-08-14 VITALS — BP 138/80 | HR 82 | Resp 20 | Ht <= 58 in | Wt 169.0 lb

## 2020-08-21 ENCOUNTER — Ambulatory Visit (INDEPENDENT_AMBULATORY_CARE_PROVIDER_SITE_OTHER): Payer: Medicare HMO | Admitting: Thoracic Surgery (Cardiothoracic Vascular Surgery)

## 2020-08-21 ENCOUNTER — Other Ambulatory Visit: Payer: Self-pay

## 2020-08-21 ENCOUNTER — Other Ambulatory Visit: Payer: Self-pay | Admitting: *Deleted

## 2020-08-21 ENCOUNTER — Other Ambulatory Visit: Payer: Self-pay | Admitting: Thoracic Surgery (Cardiothoracic Vascular Surgery)

## 2020-08-21 ENCOUNTER — Encounter: Payer: Self-pay | Admitting: Thoracic Surgery (Cardiothoracic Vascular Surgery)

## 2020-08-21 ENCOUNTER — Encounter: Payer: Self-pay | Admitting: *Deleted

## 2020-08-21 VITALS — BP 150/80 | HR 73 | Resp 20 | Ht <= 58 in | Wt 169.0 lb

## 2020-08-21 DIAGNOSIS — D4989 Neoplasm of unspecified behavior of other specified sites: Secondary | ICD-10-CM | POA: Diagnosis not present

## 2020-08-21 DIAGNOSIS — J9859 Other diseases of mediastinum, not elsewhere classified: Secondary | ICD-10-CM

## 2020-08-21 NOTE — Progress Notes (Signed)
      [address]       [place] [address]             [phone]        Farrell Solazzo Strawn Medical Record [id number] Date of Birth: 06/20/1940  Referring: Bernerd Limbo, MD Primary Care: [name], MD Primary Cardiologist:None  Reason for visit:   follow-up  History of Present Illness:     80 year old female presents in follow-up.  We were unable to obtain an interpreter however the daughter did interpret that her mother wants to proceed with surgery.  Her biggest fear was that we would have to perform a sternotomy.  Physical Exam: BP (!) 150/80   Pulse 73   Resp 20   Ht '4\' 10"'$  (1.473 m)   Wt 169 lb (76.7 kg)   SpO2 93% Comment: RA  BMI 35.32 kg/m   Alert NAD Regular rate. Easy work of breathing. Abdomen is nondistended.       Assessment / Plan:   This is a 80 year old female with an anterior mediastinal mass which shows some avidity on PET/CT.  All of her tumor markers were negative.  I recommended that she undergo a right robotic assisted thoracoscopy, with thymectomy.  The risk and benefits have been discussed.  She is agree to proceed.  She will undergo preoperative evaluation which will include a stress test.   [name] 08/21/2020 4:57 PM

## 2020-08-21 NOTE — H&P (View-Only) (Signed)
      [address]       [place] [address]             [phone]        Maddyx Noga Spaulding Medical Record [id number] Date of Birth: Sep 26, 1940  Referring: Bernerd Limbo, MD Primary Care: [name], MD Primary Cardiologist:None  Reason for visit:   follow-up  History of Present Illness:     80 year old female presents in follow-up.  We were unable to obtain an interpreter however the daughter did interpret that her mother wants to proceed with surgery.  Her biggest fear was that we would have to perform a sternotomy.  Physical Exam: BP (!) 150/80   Pulse 73   Resp 20   Ht '4\' 10"'$  (1.473 m)   Wt 169 lb (76.7 kg)   SpO2 93% Comment: RA  BMI 35.32 kg/m   Alert NAD Regular rate. Easy work of breathing. Abdomen is nondistended.       Assessment / Plan:   This is a 80 year old female with an anterior mediastinal mass which shows some avidity on PET/CT.  All of her tumor markers were negative.  I recommended that she undergo a right robotic assisted thoracoscopy, with thymectomy.  The risk and benefits have been discussed.  She is agree to proceed.  She will undergo preoperative evaluation which will include a stress test.   [name] 08/21/2020 4:57 PM

## 2020-08-25 ENCOUNTER — Telehealth (HOSPITAL_COMMUNITY): Payer: Self-pay | Admitting: *Deleted

## 2020-08-25 NOTE — Telephone Encounter (Signed)
Patient's daughter given detailed instructions per Myocardial Perfusion Study Information Sheet for the test on 08/26/20 at 7:30. Patient notified to arrive 15 minutes early and that it is imperative to arrive on time for appointment to keep from having the test rescheduled.  If you need to cancel or reschedule your appointment, please call the office within 24 hours of your appointment. . Patient verbalized understanding.Mandy [name]

## 2020-08-26 ENCOUNTER — Other Ambulatory Visit: Payer: Self-pay

## 2020-08-26 ENCOUNTER — Ambulatory Visit (HOSPITAL_COMMUNITY): Payer: Medicare HMO | Attending: Cardiology

## 2020-08-26 DIAGNOSIS — Z7984 Long term (current) use of oral hypoglycemic drugs: Secondary | ICD-10-CM | POA: Insufficient documentation

## 2020-08-26 DIAGNOSIS — E119 Type 2 diabetes mellitus without complications: Secondary | ICD-10-CM | POA: Insufficient documentation

## 2020-08-26 DIAGNOSIS — Z01810 Encounter for preprocedural cardiovascular examination: Secondary | ICD-10-CM | POA: Diagnosis not present

## 2020-08-26 DIAGNOSIS — J9859 Other diseases of mediastinum, not elsewhere classified: Secondary | ICD-10-CM | POA: Insufficient documentation

## 2020-08-26 DIAGNOSIS — Z01818 Encounter for other preprocedural examination: Secondary | ICD-10-CM | POA: Insufficient documentation

## 2020-08-26 DIAGNOSIS — I10 Essential (primary) hypertension: Secondary | ICD-10-CM | POA: Insufficient documentation

## 2020-08-26 DIAGNOSIS — Z79899 Other long term (current) drug therapy: Secondary | ICD-10-CM | POA: Insufficient documentation

## 2020-08-26 LAB — MYOCARDIAL PERFUSION IMAGING
Base ST Depression (mm): 0 mm
LV dias vol: 96 mL (ref 46–106)
LV sys vol: 49 mL
Nuc Stress EF: 49 %
Peak HR: 95 {beats}/min
Rest HR: 71 {beats}/min
Rest Nuclear Isotope Dose: 9.7 mCi
SDS: 3
SRS: 4
SSS: 9
ST Depression (mm): 0 mm
Stress Nuclear Isotope Dose: 32.4 mCi
TID: 1.25

## 2020-08-26 MED ORDER — TECHNETIUM TC 99M TETROFOSMIN IV KIT
9.7000 | PACK | Freq: Once | INTRAVENOUS | Status: AC | PRN
  Administered 2020-08-26: 9.7 via INTRAVENOUS
  Filled 2020-08-26: qty 10

## 2020-08-26 MED ORDER — TECHNETIUM TC 99M TETROFOSMIN IV KIT
32.4000 | PACK | Freq: Once | INTRAVENOUS | Status: AC | PRN
  Administered 2020-08-26: 32.4 via INTRAVENOUS
  Filled 2020-08-26: qty 33

## 2020-08-26 MED ORDER — REGADENOSON 0.4 MG/5ML IV SOLN
0.4000 mg | Freq: Once | INTRAVENOUS | Status: AC
  Administered 2020-08-26: 0.4 mg via INTRAVENOUS

## 2020-09-11 NOTE — Addendum Note (Signed)
Encounter addended by: Annie Paras on: 09/11/2020 12:20 PM  Actions taken: Letter saved

## 2020-09-14 NOTE — Pre-Procedure Instructions (Signed)
Surgical Instructions   Your procedure is scheduled on Thursday, September 15th. Report to [facility] Main Entrance "A" at 05:30 A.M., then check in with the Admitting office. Call this number if you have problems the morning of surgery: [phone]   If you have any questions prior to your surgery date call [phone]: Open Monday-Friday 8am-4pm   Remember: Do not eat or drink after midnight the night before your surgery     Take these medicines the morning of surgery with A SIP OF WATER  amLODipine (NORVASC) atenolol (TENORMIN) hydrALAZINE (APRESOLINE)  methimazole (TAPAZOLE) pantoprazole (PROTONIX)   If needed: albuterol (VENTOLIN HFA)- bring inhaler with you on day of surgery  guaiFENesin-dextromethorphan (ROBITUSSIN DM)    WHAT DO I DO ABOUT MY DIABETES MEDICATION?   Take usual dose of metFORMIN (GLUCOPHAGE) on 9/14 (Wednesday) DO NOT take metFORMIN (GLUCOPHAGE) on the day of surgery (9/15- Thursday)   [place]  Why is it important to control my blood sugar before and after surgery? Improving blood sugar levels before and after surgery helps healing and can limit problems. A way of improving blood sugar control is eating a healthy diet by:  Eating less sugar and carbohydrates  Increasing activity/exercise  Talking with your doctor about reaching your blood sugar goals High blood sugars (greater than 180 mg/dL) can raise your risk of infections and slow your recovery, so you will need to focus on controlling your diabetes during the weeks before surgery. Make sure that the doctor who takes care of your diabetes knows about your planned surgery including the date and location.  How do I manage my blood sugar before surgery? Check your blood sugar at least 4 times a day, starting 2 days before surgery, to make sure that the level is not too high or low.  Check your blood sugar the morning of your surgery when you wake  up and every 2 hours until you get to the Short Stay unit.  If your blood sugar is less than 70 mg/dL, you will need to treat for low blood sugar: Do not take insulin. Treat a low blood sugar (less than 70 mg/dL) with  cup of clear juice (cranberry or apple), 4 glucose tablets, OR glucose gel. Recheck blood sugar in 15 minutes after treatment (to make sure it is greater than 70 mg/dL). If your blood sugar is not greater than 70 mg/dL on recheck, call [phone] for further instructions. Report your blood sugar to the short stay nurse when you get to Short Stay.  If you are admitted to the hospital after surgery: Your blood sugar will be checked by the staff and you will probably be given insulin after surgery (instead of oral diabetes medicines) to make sure you have good blood sugar levels. The goal for blood sugar control after surgery is 80-180 mg/dL.   As of today, STOP taking any Aspirin (unless otherwise instructed by your surgeon) Aleve, Naproxen, Ibuprofen, Motrin, Advil, Goody's, BC's, all herbal medications, fish oil, diclofenac sodium (VOLTAREN) and all vitamins.                         Do NOT Smoke (Tobacco/Vaping)  24 hours prior to your procedure If you use a CPAP at night, you may bring your mask for your overnight stay.   Contacts, glasses, dentures or bridgework may not be worn into surgery, please bring cases for these belongings   For patients admitted  to the hospital, discharge time will be determined by your treatment team.   Patients discharged the day of surgery will not be allowed to drive home, and someone needs to stay with them for 24 hours.  ONLY 1 SUPPORT PERSON MAY BE PRESENT WHILE YOU ARE IN SURGERY. IF YOU ARE TO BE ADMITTED ONCE YOU ARE IN YOUR ROOM YOU WILL BE ALLOWED TWO (2) VISITORS.  Minor children may have two parents present. Special consideration for safety and communication needs will be reviewed on a case by case basis.  Special instructions:     Oral Hygiene is also important to reduce your risk of infection.  Remember - BRUSH YOUR TEETH THE MORNING OF SURGERY WITH YOUR REGULAR TOOTHPASTE   [place]- Preparing For Surgery  Before surgery, you can play an important role. Because skin is not sterile, your skin needs to be as free of germs as possible. You can reduce the number of germs on your skin by washing with CHG (chlorahexidine gluconate) Soap before surgery.  CHG is an antiseptic cleaner which kills germs and bonds with the skin to continue killing germs even after washing.     Please do not use if you have an allergy to CHG or antibacterial soaps. If your skin becomes reddened/irritated stop using the CHG.  Do not shave (including legs and underarms) for at least 48 hours prior to first CHG shower. It is OK to shave your face.  Please follow these instructions carefully.     Shower the NIGHT BEFORE SURGERY and the MORNING OF SURGERY with CHG Soap.   If you chose to wash your hair, wash your hair first as usual with your normal shampoo. After you shampoo, rinse your hair and body thoroughly to remove the shampoo.  Then [employer] and genitals (private parts) with your normal soap and rinse thoroughly to remove soap.  After that Use CHG Soap as you would any other liquid soap. You can apply CHG directly to the skin and wash gently with a scrungie or a clean washcloth.   Apply the CHG Soap to your body ONLY FROM THE NECK DOWN.  Do not use on open wounds or open sores. Avoid contact with your eyes, ears, mouth and genitals (private parts). Wash Face and genitals (private parts)  with your normal soap.   Wash thoroughly, paying special attention to the area where your surgery will be performed.  Thoroughly rinse your body with warm water from the neck down.  DO NOT shower/wash with your normal soap after using and rinsing off the CHG Soap.  Pat yourself dry with a CLEAN TOWEL.  Wear CLEAN PAJAMAS to bed the night before  surgery  Place CLEAN SHEETS on your bed the night before your surgery  DO NOT SLEEP WITH PETS.   Day of Surgery: Do not wear jewelry or makeup Do not wear lotions, powders, perfumes, or deodorant. Do not shave 48 hours prior to surgery.   Do not bring valuables to the hospital. [facility] is not responsible for any belongings or valuables. Do not wear nail polish, gel polish, artificial nails, or any other type of covering on natural nails including finger and toenails. If patients have artificial nails, gel coating, etc. that need to be removed by a nail salon please have this removed prior to surgery or surgery may need to be canceled/delayed if the surgeon/ anesthesia feels like the patient is unable to be adequately monitored. Take a shower with CHG soap. Wear  Clean/Comfortable clothing the morning of surgery Do not apply any deodorants/lotions.   Remember to brush your teeth WITH YOUR REGULAR TOOTHPASTE.   Please read over the following fact sheets that you were given.

## 2020-09-15 ENCOUNTER — Encounter (HOSPITAL_COMMUNITY)
Admission: RE | Admit: 2020-09-15 | Discharge: 2020-09-15 | Disposition: A | Discharge status: Home / Self Care | Payer: Medicare HMO | Source: Ambulatory Visit | Attending: Thoracic Surgery (Cardiothoracic Vascular Surgery) | Admitting: Thoracic Surgery (Cardiothoracic Vascular Surgery)

## 2020-09-15 ENCOUNTER — Encounter (HOSPITAL_COMMUNITY): Payer: Self-pay | Admitting: Radiology

## 2020-09-15 NOTE — Pre-Procedure Instructions (Addendum)
Surgical Instructions   Your procedure is scheduled on Thursday, September 17, 2020 at 7:30 AM. Report to [facility] Main Entrance "A" at 05:30 A.M., then check in with the Admitting office. Call this number if you have problems the morning of surgery: [phone]   If you have any questions prior to your surgery date call [phone]: Open Monday-Friday 8am-4pm   Remember: Do not eat or drink after midnight the night before your surgery     Take these medicines the morning of surgery with A SIP OF WATER  amLODipine (NORVASC) atenolol (TENORMIN) hydrALAZINE (APRESOLINE)  methimazole (TAPAZOLE) pantoprazole (PROTONIX)   If needed: albuterol (VENTOLIN HFA)- bring inhaler with you on day of surgery  guaiFENesin-dextromethorphan (ROBITUSSIN DM)   Follow your surgeon's instructions on when to stop Aspirin.  If no instructions were given by your surgeon then you will need to call the office to get those instructions.     WHAT DO I DO ABOUT MY DIABETES MEDICATION?  DO NOT take metFORMIN (GLUCOPHAGE) on the day of surgery (9/15- Thursday)   [place]  Why is it important to control my blood sugar before and after surgery? Improving blood sugar levels before and after surgery helps healing and can limit problems. A way of improving blood sugar control is eating a healthy diet by:  Eating less sugar and carbohydrates  Increasing activity/exercise  Talking with your doctor about reaching your blood sugar goals High blood sugars (greater than 180 mg/dL) can raise your risk of infections and slow your recovery, so you will need to focus on controlling your diabetes during the weeks before surgery. Make sure that the doctor who takes care of your diabetes knows about your planned surgery including the date and location.  How do I manage my blood sugar before surgery? Check your blood sugar at least 4 times a day, starting 2 days before  surgery, to make sure that the level is not too high or low.  Check your blood sugar the morning of your surgery when you wake up and every 2 hours until you get to the Short Stay unit.  If your blood sugar is less than 70 mg/dL, you will need to treat for low blood sugar: Do not take insulin. Treat a low blood sugar (less than 70 mg/dL) with  cup of clear juice (cranberry or apple), 4 glucose tablets, OR glucose gel. Recheck blood sugar in 15 minutes after treatment (to make sure it is greater than 70 mg/dL). If your blood sugar is not greater than 70 mg/dL on recheck, call [phone] for further instructions. Report your blood sugar to the short stay nurse when you get to Short Stay.  If you are admitted to the hospital after surgery: Your blood sugar will be checked by the staff and you will probably be given insulin after surgery (instead of oral diabetes medicines) to make sure you have good blood sugar levels. The goal for blood sugar control after surgery is 80-180 mg/dL.   As of today, STOP taking any Aspirin (unless otherwise instructed by your surgeon) Aleve, Naproxen, Ibuprofen, Motrin, Advil, Goody's, BC's, all herbal medications, fish oil, diclofenac sodium (VOLTAREN) and all vitamins.                         Do NOT Smoke (Tobacco/Vaping)  24 hours prior to your procedure If you use a CPAP at night, you may bring your mask for  your overnight stay.   Contacts, glasses, dentures or bridgework may not be worn into surgery, please bring cases for these belongings   For patients admitted to the hospital, discharge time will be determined by your treatment team.   Patients discharged the day of surgery will not be allowed to drive home, and someone needs to stay with them for 24 hours.  ONLY 1 SUPPORT PERSON MAY BE PRESENT WHILE YOU ARE IN SURGERY. IF YOU ARE TO BE ADMITTED ONCE YOU ARE IN YOUR ROOM YOU WILL BE ALLOWED TWO (2) VISITORS.  Minor children may have two parents  present. Special consideration for safety and communication needs will be reviewed on a case by case basis.  Special instructions:    Oral Hygiene is also important to reduce your risk of infection.  Remember - BRUSH YOUR TEETH THE MORNING OF SURGERY WITH YOUR REGULAR TOOTHPASTE   [place]- Preparing For Surgery  Before surgery, you can play an important role. Because skin is not sterile, your skin needs to be as free of germs as possible. You can reduce the number of germs on your skin by washing with CHG (chlorahexidine gluconate) Soap before surgery.  CHG is an antiseptic cleaner which kills germs and bonds with the skin to continue killing germs even after washing.     Please do not use if you have an allergy to CHG or antibacterial soaps. If your skin becomes reddened/irritated stop using the CHG.  Do not shave (including legs and underarms) for at least 48 hours prior to first CHG shower. It is OK to shave your face.  Please follow these instructions carefully.     Shower the NIGHT BEFORE SURGERY and the MORNING OF SURGERY with CHG Soap.   If you chose to wash your hair, wash your hair first as usual with your normal shampoo. After you shampoo, rinse your hair and body thoroughly to remove the shampoo.  Then [employer] and genitals (private parts) with your normal soap and rinse thoroughly to remove soap.  After that Use CHG Soap as you would any other liquid soap. You can apply CHG directly to the skin and wash gently with a scrungie or a clean washcloth.   Apply the CHG Soap to your body ONLY FROM THE NECK DOWN.  Do not use on open wounds or open sores. Avoid contact with your eyes, ears, mouth and genitals (private parts). Wash Face and genitals (private parts)  with your normal soap.   Wash thoroughly, paying special attention to the area where your surgery will be performed.  Thoroughly rinse your body with warm water from the neck down.  DO NOT shower/wash with your normal  soap after using and rinsing off the CHG Soap.  Pat yourself dry with a CLEAN TOWEL.  Wear CLEAN PAJAMAS to bed the night before surgery  Place CLEAN SHEETS on your bed the night before your surgery  DO NOT SLEEP WITH PETS.   Day of Surgery: Do not wear jewelry or makeup Do not wear lotions, powders, perfumes, or deodorant. Do not shave 48 hours prior to surgery.   Do not bring valuables to the hospital. [facility] is not responsible for any belongings or valuables. Do not wear nail polish, gel polish, artificial nails, or any other type of covering on natural nails including finger and toenails. If patients have artificial nails, gel coating, etc. that need to be removed by a nail salon please have this removed prior to surgery or  surgery may need to be canceled/delayed if the surgeon/ anesthesia feels like the patient is unable to be adequately monitored. Take a shower with CHG soap. Wear Clean/Comfortable clothing the morning of surgery Do not apply any deodorants/lotions.   Remember to brush your teeth WITH YOUR REGULAR TOOTHPASTE.   Please read over the following fact sheets that you were given.

## 2020-09-16 ENCOUNTER — Encounter (HOSPITAL_COMMUNITY)
Admission: RE | Admit: 2020-09-16 | Discharge: 2020-09-16 | Disposition: A | Discharge status: Home / Self Care | Payer: Medicare HMO | Source: Ambulatory Visit | Attending: Thoracic Surgery (Cardiothoracic Vascular Surgery) | Admitting: Thoracic Surgery (Cardiothoracic Vascular Surgery)

## 2020-09-16 ENCOUNTER — Ambulatory Visit (HOSPITAL_COMMUNITY)
Admission: RE | Admit: 2020-09-16 | Discharge: 2020-09-16 | Disposition: A | Discharge status: Home / Self Care | Payer: Medicare HMO | Source: Ambulatory Visit | Attending: Thoracic Surgery (Cardiothoracic Vascular Surgery) | Admitting: Thoracic Surgery (Cardiothoracic Vascular Surgery)

## 2020-09-16 ENCOUNTER — Other Ambulatory Visit: Payer: Self-pay

## 2020-09-16 ENCOUNTER — Encounter (HOSPITAL_COMMUNITY): Payer: Self-pay

## 2020-09-16 DIAGNOSIS — J9859 Other diseases of mediastinum, not elsewhere classified: Secondary | ICD-10-CM | POA: Insufficient documentation

## 2020-09-16 DIAGNOSIS — Z01818 Encounter for other preprocedural examination: Secondary | ICD-10-CM

## 2020-09-16 HISTORY — DX: Unspecified osteoarthritis, unspecified site: M19.90

## 2020-09-16 HISTORY — DX: Gastro-esophageal reflux disease without esophagitis: K21.9

## 2020-09-16 HISTORY — DX: Pneumonia, unspecified organism: J18.9

## 2020-09-16 LAB — BLOOD GAS, ARTERIAL
Acid-Base Excess: 3.5 mmol/L — ABNORMAL HIGH (ref 0.0–2.0)
Bicarbonate: 27.6 mmol/L (ref 20.0–28.0)
FIO2: 21
O2 Saturation: 97.2 %
Patient temperature: 37
pCO2 arterial: 42.4 mmHg (ref 32.0–48.0)
pH, Arterial: 7.428 (ref 7.350–7.450)
pO2, Arterial: 94.2 mmHg (ref 83.0–108.0)

## 2020-09-16 LAB — COMPREHENSIVE METABOLIC PANEL
ALT: 8 U/L (ref 0–44)
AST: 18 U/L (ref 15–41)
Albumin: 3.8 g/dL (ref 3.5–5.0)
Alkaline Phosphatase: 32 U/L — ABNORMAL LOW (ref 38–126)
Anion gap: 16 — ABNORMAL HIGH (ref 5–15)
BUN: 15 mg/dL (ref 8–23)
CO2: 25 mmol/L (ref 22–32)
Calcium: 9.3 mg/dL (ref 8.9–10.3)
Chloride: 88 mmol/L — ABNORMAL LOW (ref 98–111)
Creatinine, Ser: 1.12 mg/dL — ABNORMAL HIGH (ref 0.44–1.00)
GFR, Estimated: 50 mL/min — ABNORMAL LOW (ref >60)
Glucose, Bld: 166 mg/dL — ABNORMAL HIGH (ref 70–99)
Potassium: 3.3 mmol/L — ABNORMAL LOW (ref 3.5–5.1)
Sodium: 129 mmol/L — ABNORMAL LOW (ref 135–145)
Total Bilirubin: 0.8 mg/dL (ref 0.3–1.2)
Total Protein: 6.7 g/dL (ref 6.5–8.1)

## 2020-09-16 LAB — GLUCOSE, CAPILLARY: Glucose-Capillary: 158 mg/dL — ABNORMAL HIGH (ref 70–99)

## 2020-09-16 LAB — PROTIME-INR
INR: 1 (ref 0.8–1.2)
Prothrombin Time: 13.2 seconds (ref 11.4–15.2)

## 2020-09-16 LAB — CBC
HCT: 35.2 % — ABNORMAL LOW (ref 36.0–46.0)
Hemoglobin: 11.8 g/dL — ABNORMAL LOW (ref 12.0–15.0)
MCH: 26.5 pg (ref 26.0–34.0)
MCHC: 33.5 g/dL (ref 30.0–36.0)
MCV: 78.9 fL — ABNORMAL LOW (ref 80.0–100.0)
Platelets: 372 10*3/uL (ref 150–400)
RBC: 4.46 MIL/uL (ref 3.87–5.11)
RDW: 14.9 % (ref 11.5–15.5)
WBC: 5.9 10*3/uL (ref 4.0–10.5)
nRBC: 0 % (ref 0.0–0.2)

## 2020-09-16 LAB — HEMOGLOBIN A1C
Hgb A1c MFr Bld: 5.9 % — ABNORMAL HIGH (ref 4.8–5.6)
Mean Plasma Glucose: 122.63 mg/dL

## 2020-09-16 LAB — TYPE AND SCREEN
ABO/RH(D): O POS
Antibody Screen: NEGATIVE

## 2020-09-16 LAB — SURGICAL PCR SCREEN
MRSA, PCR: NEGATIVE
Staphylococcus aureus: NEGATIVE

## 2020-09-16 LAB — APTT: aPTT: 30 seconds (ref 24–36)

## 2020-09-16 NOTE — Progress Notes (Addendum)
Pt speaks [place] (pt is from [place]). Interpretation provided by pt's daughter [place].   PCP - [name], MD w/ [facility]; records requested Cardiologist - Denies  PPM/ICD - Denies  Chest x-ray - 09/16/20 EKG - 09/16/20 Stress Test - 08/26/20 ECHO - 02/03/19 Cardiac Cath - Denies  Sleep Study - Denies   Fasting Blood Sugar - 110-117 Checks Blood Sugar 2 times a day. CBG at PAT was 158. A1C obtained  Blood Thinner Instructions: N/A Aspirin Instructions: Per pt's daughter, Mandy [name], last dose 09/15/20  ERAS Protcol - NO PRE-SURGERY Ensure or G2- NOT ORDERED  COVID TEST- N/A; Pt positive 06/26/20 (see lab results in [place])   Anesthesia review: Yes, CT hx.  Patient denies shortness of breath, fever, cough and chest pain at PAT appointment   All instructions explained to the patient, with a verbal understanding of the material. Patient agrees to go over the instructions while at home for a better understanding. The opportunity to ask questions was provided.

## 2020-09-16 NOTE — Anesthesia Preprocedure Evaluation (Addendum)
Anesthesia Evaluation  Patient identified by MRN, date of birth, ID band Patient awake    Reviewed: Allergy & Precautions, NPO status , Patient's Chart, lab work & pertinent test results  Airway Mallampati: II  TM Distance: >3 FB     Dental   Pulmonary pneumonia,    breath sounds clear to auscultation       Cardiovascular hypertension, +CHF   Rhythm:Regular Rate:Normal     Neuro/Psych    GI/Hepatic Neg liver ROS, GERD  ,  Endo/Other  diabetesHyperthyroidism   Renal/GU      Musculoskeletal   Abdominal   Peds  Hematology   Anesthesia Other Findings   Reproductive/Obstetrics                            Anesthesia Physical Anesthesia Plan  ASA: 3  Anesthesia Plan: General   Post-op Pain Management:    Induction: Intravenous  PONV Risk Score and Plan: Ondansetron and Dexamethasone  Airway Management Planned: Oral ETT and Video Laryngoscope Planned  Additional Equipment: Arterial line  Intra-op Plan:   Post-operative Plan: Possible Post-op intubation/ventilation  Informed Consent:   Plan Discussed with:   Anesthesia Plan Comments: (PAT note by Karoline Caldwell, PA-C: Hyponatremia noted on preop labs with sodium 129.  Review of previous labs shows chronic hyponatremia with recent baseline around 130.  Remainder of labs unremarkable.  DM2 well-controlled, A1c 5.9 on 09/16/2020.  Recent stress test ordered by Dr. Kipp Brood was low risk, nonischemic.  Prior echo January 2021 showed EF 50 to 55%, grade 1 DD, mild MR, mild TR, moderately elevated PASP.  EKG 09/16/20: Normal sinus rhythm. Rate 73. Nonspecific ST abnormality  Nuclear stress 08/26/20: . Findings are consistent with no ischemia. The study is low risk. . No ST deviation was noted. . LV perfusion is equivocal. There is no evidence of ischemia. There is no evidence of infarction. . Defect 1: There is a small defect with  mild reduction in uptake present in the apical anterior and apex location(s) that is improved with stress, not consistent with ischemia. There is normal wall motion in the defect area. . Nuclear stress EF: 49 %. The left ventricular ejection fraction is mildly decreased (45-54%). Left ventricular function is abnormal. There was a single regional abnormality. End diastolic cavity size is normal. End systolic cavity size is normal.  Overall low risk study without evidence of ischemia.   TTE 02/03/20: 1. Left ventricular ejection fraction, by visual estimation, is 50 to  55%.  2. Left ventricular diastolic parameters are consistent with Grade I  diastolic dysfunction (impaired relaxation).  3. Global right ventricle has normal systolc function.The right  ventricular size is normal. no increase in right ventricular wall  thickness.  4. Small pericardial effusion.  5. The mitral valve is abnormal. Mild mitral valve regurgitation.  6. The tricuspid valve was normal in structure. Tricuspid valve  regurgitation is mild.  7. Tricuspid valve regurgitation is mild.  8. Mild aortic valve sclerosis without stenosis.  9. The pulmonic valve was normal in structure.  10. Moderately elevated pulmonary artery systolic pressure.  11. The inferior vena cava is normal in size with greater than 50%  respiratory variability, suggesting right atrial pressure of 3 mmHg.   )       Anesthesia Quick Evaluation

## 2020-09-16 NOTE — Progress Notes (Signed)
Anesthesia Chart Review:  Hyponatremia noted on preop labs with sodium 129.  Review of previous labs shows chronic hyponatremia with recent baseline around 130.  Remainder of labs unremarkable.  DM2 well-controlled, A1c 5.9 on 09/16/2020.  Recent stress test ordered by Dr. Kipp Brood was low risk, nonischemic.  Prior echo January 2021 showed EF 50 to 55%, grade 1 DD, mild MR, mild TR, moderately elevated PASP.  EKG 09/16/20: Normal sinus rhythm. Rate 73. Nonspecific ST abnormality  Nuclear stress 08/26/20:   Findings are consistent with no ischemia. The study is low risk.   No ST deviation was noted.   LV perfusion is equivocal. There is no evidence of ischemia. There is no evidence of infarction.   Defect 1: There is a small defect with mild reduction in uptake present in the apical anterior and apex location(s) that is improved with stress, not consistent with ischemia. There is normal wall motion in the defect area.   Nuclear stress EF: 49 %. The left ventricular ejection fraction is mildly decreased (45-54%). Left ventricular function is abnormal. There was a single regional abnormality. End diastolic cavity size is normal. End systolic cavity size is normal.   Overall low risk study without evidence of ischemia.   TTE 02/03/20:  1. Left ventricular ejection fraction, by visual estimation, is 50 to  55%.   2. Left ventricular diastolic parameters are consistent with Grade I  diastolic dysfunction (impaired relaxation).   3. Global right ventricle has normal systolc function.The right  ventricular size is normal. no increase in right ventricular wall  thickness.   4. Small pericardial effusion.   5. The mitral valve is abnormal. Mild mitral valve regurgitation.   6. The tricuspid valve was normal in structure. Tricuspid valve  regurgitation is mild.   7. Tricuspid valve regurgitation is mild.   8. Mild aortic valve sclerosis without stenosis.   9. The pulmonic valve was normal in  structure.  10. Moderately elevated pulmonary artery systolic pressure.  11. The inferior vena cava is normal in size with greater than 50%  respiratory variability, suggesting right atrial pressure of 3 mmHg.     Wynonia Musty [facility] Short Stay Center/Anesthesiology Phone [phone] 09/16/2020 3:48 PM

## 2020-09-16 NOTE — Progress Notes (Signed)
Pt speaks "[place]", which is not available via interpreter services. [name], Burman Freestone to accompany patient in preop and serve as interpreter on DOS.

## 2020-09-16 NOTE — Progress Notes (Addendum)
Unable to obtain urine sample for U/A. Will obtain DOS. Notified Levonne Spiller, RN w/ Dr. Kipp Brood concerning u/a, as well as serum sodium of 129.

## 2020-09-16 NOTE — Pre-Procedure Instructions (Signed)
Surgical Instructions:    Your procedure is scheduled on Thursday, September 15th.  Report to [facility] Main Entrance "A" at 05:30 A.M., then check in with the Admitting office.  Call this number if you have any questions prior to your surgery date, or have problems the morning of surgery:  [phone]    Remember:  Do not eat or drink after midnight the night before your surgery.     Take these medicines the morning of surgery with A SIP OF WATER:   amLODipine (NORVASC) atenolol (TENORMIN)  hydrALAZINE (APRESOLINE) methimazole (TAPAZOLE) pantoprazole (PROTONIX)   IF NEEDED: albuterol (VENTOLIN HFA) inhaler- Please bring all inhalers with you the day of surgery.    *Follow your surgeon's instructions concerning Aspirin.  If no instructions were given by your surgeon then you will need to call the office to get those instructions.     As of today, STOP taking any NSAIDs such as diclofenac sodium (VOLTAREN) GEL, Aleve, Naproxen, Ibuprofen, Motrin, Advil, Goody's, BC's, all herbal medications, fish oil, and all vitamins.    WHAT DO I DO ABOUT MY DIABETES MEDICATION? THE MORNING OF SURGERY, Do not take metFORMIN (GLUCOPHAGE).     HOW TO MANAGE YOUR DIABETES BEFORE AND AFTER SURGERY  Why is it important to control my blood sugar before and after surgery? Improving blood sugar levels before and after surgery helps healing and can limit problems. A way of improving blood sugar control is eating a healthy diet by:  Eating less sugar and carbohydrates  Increasing activity/exercise  Talking with your doctor about reaching your blood sugar goals High blood sugars (greater than 180 mg/dL) can raise your risk of infections and slow your recovery, so you will need to focus on controlling your diabetes during the weeks before surgery. Make sure that the doctor who takes care of your diabetes knows about your planned surgery including the date and location.  How do I manage my  blood sugar before surgery? Check your blood sugar at least 4 times a day, starting 2 days before surgery, to make sure that the level is not too high or low.  Check your blood sugar the morning of your surgery when you wake up and every 2 hours until you get to the Short Stay unit.  If your blood sugar is less than 70 mg/dL, you will need to treat for low blood sugar: Do not take insulin. Treat a low blood sugar (less than 70 mg/dL) with  cup of clear juice (cranberry or apple), 4 glucose tablets, OR glucose gel. Recheck blood sugar in 15 minutes after treatment (to make sure it is greater than 70 mg/dL). If your blood sugar is not greater than 70 mg/dL on recheck, call [phone] for further instructions. Report your blood sugar to the short stay nurse when you get to Short Stay.  If you are admitted to the hospital after surgery: Your blood sugar will be checked by the staff and you will probably be given insulin after surgery (instead of oral diabetes medicines) to make sure you have good blood sugar levels. The goal for blood sugar control after surgery is 80-180 mg/dL.              Special instructions:    [place]- Preparing For Surgery  Before surgery, you can play an important role. Because skin is not sterile, your skin needs to be as free of germs as possible. You can reduce the number of germs on your skin by washing  with CHG (chlorahexidine gluconate) Soap before surgery.  CHG is an antiseptic cleaner which kills germs and bonds with the skin to continue killing germs even after washing.     Please do not use if you have an allergy to CHG or antibacterial soaps. If your skin becomes reddened/irritated stop using the CHG.  Do not shave (including legs and underarms) for at least 48 hours prior to first CHG shower. It is OK to shave your face.  Please follow these instructions carefully.     Shower the NIGHT BEFORE SURGERY and the MORNING OF SURGERY with CHG Soap.    If you chose to wash your hair, wash your hair first as usual with your normal shampoo. After you shampoo, rinse your hair and body thoroughly to remove the shampoo.  Then [employer] and genitals (private parts) with your normal soap and rinse thoroughly to remove soap.  After that Use CHG Soap as you would any other liquid soap. You can apply CHG directly to the skin and wash gently with a scrungie or a clean washcloth.   Apply the CHG Soap to your body ONLY FROM THE NECK DOWN.  Do not use on open wounds or open sores. Avoid contact with your eyes, ears, mouth and genitals (private parts). Wash Face and genitals (private parts)  with your normal soap.   Wash thoroughly, paying special attention to the area where your surgery will be performed.  Thoroughly rinse your body with warm water from the neck down.  DO NOT shower/wash with your normal soap after using and rinsing off the CHG Soap.  Pat yourself dry with a CLEAN TOWEL.  Wear CLEAN PAJAMAS to bed the night before surgery  Place CLEAN SHEETS on your bed the night before your surgery  DO NOT SLEEP WITH PETS.   Day of Surgery:  Take a shower with CHG soap. Wear Clean/Comfortable clothing the morning of surgery Do not wear lotions, powders, perfumes/colognes, or deodorant.   Remember to brush your teeth WITH YOUR REGULAR TOOTHPASTE. Do not wear jewelry or makeup. DO Not wear nail polish, gel polish, artificial nails, or any other type of covering on natural nails including finger and toenails. If patients have artificial nails, gel coating, etc. that need to be removed by a nail salon please have this removed prior to surgery or surgery may need to be canceled/delayed if the surgeon/ anesthesia feels like the patient is unable to be adequately monitored. Do not shave 48 hours prior to surgery. Do not bring valuables to the hospital. [facility] is not responsible for any belongings or valuables.  Do NOT Smoke (Tobacco/Vaping) or  drink Alcohol 24 hours prior to your procedure.  If you use a CPAP at night, you may bring all equipment for your overnight stay.   Contacts, glasses, dentures or bridgework may not be worn into surgery, please bring cases for these belongings.   For patients admitted to the hospital, discharge time will be determined by your treatment team.  Patients discharged the day of surgery will not be allowed to drive home, and someone needs to stay with them for 24 hours.  ONLY 1 SUPPORT PERSON MAY BE PRESENT WHILE YOU ARE IN SURGERY. IF YOU ARE TO BE ADMITTED ONCE YOU ARE IN YOUR ROOM YOU WILL BE ALLOWED TWO (2) VISITORS.  Minor children may have two parents present. Special consideration for safety and communication needs will be reviewed on a case by case basis.     Please  read over the following fact sheets that you were given.

## 2020-09-17 ENCOUNTER — Encounter (HOSPITAL_COMMUNITY)
Admission: RE | Disposition: A | Discharge status: Home Health | Payer: Self-pay | Source: Ambulatory Visit | Attending: Thoracic Surgery (Cardiothoracic Vascular Surgery)

## 2020-09-17 ENCOUNTER — Inpatient Hospital Stay (HOSPITAL_COMMUNITY): Payer: Medicare HMO | Admitting: Anesthesiology

## 2020-09-17 ENCOUNTER — Inpatient Hospital Stay (HOSPITAL_COMMUNITY)
Admission: RE | Admit: 2020-09-17 | Discharge: 2020-09-22 | DRG: 167 | Disposition: A | Discharge status: Home Health | Payer: Medicare HMO | Source: Ambulatory Visit | Attending: Thoracic Surgery (Cardiothoracic Vascular Surgery) | Admitting: Thoracic Surgery (Cardiothoracic Vascular Surgery)

## 2020-09-17 ENCOUNTER — Encounter (HOSPITAL_COMMUNITY): Payer: Self-pay | Admitting: Thoracic Surgery (Cardiothoracic Vascular Surgery)

## 2020-09-17 ENCOUNTER — Other Ambulatory Visit: Payer: Self-pay

## 2020-09-17 ENCOUNTER — Observation Stay (HOSPITAL_COMMUNITY): Payer: Medicare HMO

## 2020-09-17 DIAGNOSIS — Z7982 Long term (current) use of aspirin: Secondary | ICD-10-CM | POA: Diagnosis not present

## 2020-09-17 DIAGNOSIS — E119 Type 2 diabetes mellitus without complications: Secondary | ICD-10-CM | POA: Diagnosis present

## 2020-09-17 DIAGNOSIS — Z7984 Long term (current) use of oral hypoglycemic drugs: Secondary | ICD-10-CM

## 2020-09-17 DIAGNOSIS — E871 Hypo-osmolality and hyponatremia: Secondary | ICD-10-CM | POA: Diagnosis present

## 2020-09-17 DIAGNOSIS — Z8616 Personal history of COVID-19: Secondary | ICD-10-CM

## 2020-09-17 DIAGNOSIS — R222 Localized swelling, mass and lump, trunk: Secondary | ICD-10-CM | POA: Diagnosis present

## 2020-09-17 DIAGNOSIS — E876 Hypokalemia: Secondary | ICD-10-CM | POA: Diagnosis present

## 2020-09-17 DIAGNOSIS — Z79891 Long term (current) use of opiate analgesic: Secondary | ICD-10-CM

## 2020-09-17 DIAGNOSIS — Z09 Encounter for follow-up examination after completed treatment for conditions other than malignant neoplasm: Secondary | ICD-10-CM

## 2020-09-17 DIAGNOSIS — D62 Acute posthemorrhagic anemia: Secondary | ICD-10-CM | POA: Diagnosis not present

## 2020-09-17 DIAGNOSIS — E0590 Thyrotoxicosis, unspecified without thyrotoxic crisis or storm: Secondary | ICD-10-CM | POA: Diagnosis present

## 2020-09-17 DIAGNOSIS — Z79899 Other long term (current) drug therapy: Secondary | ICD-10-CM

## 2020-09-17 DIAGNOSIS — E669 Obesity, unspecified: Secondary | ICD-10-CM | POA: Diagnosis present

## 2020-09-17 DIAGNOSIS — J9859 Other diseases of mediastinum, not elsewhere classified: Secondary | ICD-10-CM | POA: Diagnosis present

## 2020-09-17 DIAGNOSIS — E785 Hyperlipidemia, unspecified: Secondary | ICD-10-CM | POA: Diagnosis present

## 2020-09-17 DIAGNOSIS — Z7983 Long term (current) use of bisphosphonates: Secondary | ICD-10-CM | POA: Diagnosis not present

## 2020-09-17 DIAGNOSIS — I119 Hypertensive heart disease without heart failure: Secondary | ICD-10-CM | POA: Diagnosis present

## 2020-09-17 LAB — GLUCOSE, CAPILLARY
Glucose-Capillary: 129 mg/dL — ABNORMAL HIGH (ref 70–99)
Glucose-Capillary: 143 mg/dL — ABNORMAL HIGH (ref 70–99)
Glucose-Capillary: 156 mg/dL — ABNORMAL HIGH (ref 70–99)
Glucose-Capillary: 163 mg/dL — ABNORMAL HIGH (ref 70–99)
Glucose-Capillary: 199 mg/dL — ABNORMAL HIGH (ref 70–99)

## 2020-09-17 SURGERY — EXCISION, MASS, MEDIASTINUM, ROBOT-ASSISTED
Anesthesia: General | Site: Chest | Laterality: Right

## 2020-09-17 MED ORDER — PANTOPRAZOLE SODIUM 40 MG PO TBEC
40.0000 mg | DELAYED_RELEASE_TABLET | Freq: Every day | ORAL | Status: DC
  Administered 2020-09-17 – 2020-09-21 (×5): 40 mg via ORAL
  Filled 2020-09-17 (×5): qty 1

## 2020-09-17 MED ORDER — MIDAZOLAM HCL 5 MG/5ML IJ SOLN
INTRAMUSCULAR | Status: DC | PRN
  Administered 2020-09-17: 2 mg via INTRAVENOUS

## 2020-09-17 MED ORDER — BUPIVACAINE LIPOSOME 1.3 % IJ SUSP
INTRAMUSCULAR | Status: DC | PRN
  Administered 2020-09-17: 100 mL

## 2020-09-17 MED ORDER — PHENYLEPHRINE HCL-NACL 20-0.9 MG/250ML-% IV SOLN: INTRAVENOUS | Status: DC | PRN

## 2020-09-17 MED ORDER — ACETAMINOPHEN 500 MG PO TABS
1000.0000 mg | ORAL_TABLET | Freq: Four times a day (QID) | ORAL | Status: AC
  Administered 2020-09-17 – 2020-09-22 (×17): 1000 mg via ORAL
  Filled 2020-09-17 (×18): qty 2

## 2020-09-17 MED ORDER — PHENYLEPHRINE 40 MCG/ML (10ML) SYRINGE FOR IV PUSH (FOR BLOOD PRESSURE SUPPORT)
PREFILLED_SYRINGE | INTRAVENOUS | Status: DC | PRN
  Administered 2020-09-17: 120 ug via INTRAVENOUS
  Administered 2020-09-17: 40 ug via INTRAVENOUS

## 2020-09-17 MED ORDER — MORPHINE SULFATE (PF) 2 MG/ML IV SOLN: 2.0000 mg | INTRAVENOUS | Status: DC | PRN

## 2020-09-17 MED ORDER — ACETAMINOPHEN 10 MG/ML IV SOLN
INTRAVENOUS | Status: AC
  Filled 2020-09-17: qty 100

## 2020-09-17 MED ORDER — CEFAZOLIN SODIUM-DEXTROSE 2-4 GM/100ML-% IV SOLN
2.0000 g | INTRAVENOUS | Status: AC
  Administered 2020-09-17: 2 g via INTRAVENOUS
  Filled 2020-09-17: qty 100

## 2020-09-17 MED ORDER — KETOROLAC TROMETHAMINE 30 MG/ML IJ SOLN
INTRAMUSCULAR | Status: DC | PRN
  Administered 2020-09-17: 15 mg via INTRAVENOUS

## 2020-09-17 MED ORDER — KETOROLAC TROMETHAMINE 30 MG/ML IJ SOLN
INTRAMUSCULAR | Status: AC
  Filled 2020-09-17: qty 1

## 2020-09-17 MED ORDER — PROPOFOL 10 MG/ML IV BOLUS
INTRAVENOUS | Status: DC | PRN
  Administered 2020-09-17: 100 mg via INTRAVENOUS

## 2020-09-17 MED ORDER — FENTANYL CITRATE (PF) 250 MCG/5ML IJ SOLN
INTRAMUSCULAR | Status: DC | PRN
  Administered 2020-09-17 (×3): 50 ug via INTRAVENOUS

## 2020-09-17 MED ORDER — GLYCOPYRROLATE PF 0.2 MG/ML IJ SOSY
PREFILLED_SYRINGE | INTRAMUSCULAR | Status: AC
  Filled 2020-09-17: qty 2

## 2020-09-17 MED ORDER — PROPOFOL 10 MG/ML IV BOLUS
INTRAVENOUS | Status: AC
  Filled 2020-09-17: qty 20

## 2020-09-17 MED ORDER — TRAMADOL HCL 50 MG PO TABS
50.0000 mg | ORAL_TABLET | Freq: Four times a day (QID) | ORAL | Status: DC | PRN
  Administered 2020-09-17 – 2020-09-18 (×2): 100 mg via ORAL
  Filled 2020-09-17 (×2): qty 2

## 2020-09-17 MED ORDER — KETOROLAC TROMETHAMINE 0.5 % OP SOLN
1.0000 [drp] | Freq: Once | OPHTHALMIC | Status: AC
  Administered 2020-09-17: 1 [drp] via OPHTHALMIC

## 2020-09-17 MED ORDER — CHLORHEXIDINE GLUCONATE CLOTH 2 % EX PADS: 6.0000 | MEDICATED_PAD | Freq: Every day | CUTANEOUS | Status: DC

## 2020-09-17 MED ORDER — KETOROLAC TROMETHAMINE 0.5 % OP SOLN
OPHTHALMIC | Status: AC
  Filled 2020-09-17: qty 5

## 2020-09-17 MED ORDER — DEXAMETHASONE SODIUM PHOSPHATE 10 MG/ML IJ SOLN
INTRAMUSCULAR | Status: AC
  Filled 2020-09-17: qty 1

## 2020-09-17 MED ORDER — ROCURONIUM BROMIDE 10 MG/ML (PF) SYRINGE
PREFILLED_SYRINGE | INTRAVENOUS | Status: AC
  Filled 2020-09-17: qty 10

## 2020-09-17 MED ORDER — GABAPENTIN 100 MG PO CAPS
100.0000 mg | ORAL_CAPSULE | Freq: Every day | ORAL | Status: DC
  Administered 2020-09-17 – 2020-09-21 (×5): 100 mg via ORAL
  Filled 2020-09-17 (×5): qty 1

## 2020-09-17 MED ORDER — SUCCINYLCHOLINE CHLORIDE 200 MG/10ML IV SOSY
PREFILLED_SYRINGE | INTRAVENOUS | Status: AC
  Filled 2020-09-17: qty 10

## 2020-09-17 MED ORDER — ESMOLOL HCL 100 MG/10ML IV SOLN
INTRAVENOUS | Status: AC
  Filled 2020-09-17: qty 20

## 2020-09-17 MED ORDER — ORAL CARE MOUTH RINSE: 15.0000 mL | Freq: Once | OROMUCOSAL | Status: AC

## 2020-09-17 MED ORDER — BUPIVACAINE HCL (PF) 0.5 % IJ SOLN
INTRAMUSCULAR | Status: AC
  Filled 2020-09-17: qty 30

## 2020-09-17 MED ORDER — EPHEDRINE 5 MG/ML INJ
INTRAVENOUS | Status: AC
  Filled 2020-09-17: qty 5

## 2020-09-17 MED ORDER — FENTANYL CITRATE (PF) 250 MCG/5ML IJ SOLN
INTRAMUSCULAR | Status: AC
  Filled 2020-09-17: qty 5

## 2020-09-17 MED ORDER — KETOROLAC TROMETHAMINE 15 MG/ML IJ SOLN
15.0000 mg | Freq: Four times a day (QID) | INTRAMUSCULAR | Status: DC
  Administered 2020-09-17 – 2020-09-18 (×3): 15 mg via INTRAVENOUS
  Filled 2020-09-17 (×4): qty 1

## 2020-09-17 MED ORDER — IPRATROPIUM-ALBUTEROL 0.5-2.5 (3) MG/3ML IN SOLN
RESPIRATORY_TRACT | Status: AC
  Filled 2020-09-17: qty 3

## 2020-09-17 MED ORDER — DICLOFENAC SODIUM 1 % TD GEL
4.0000 g | Freq: Four times a day (QID) | TRANSDERMAL | Status: DC | PRN
  Filled 2020-09-17: qty 100

## 2020-09-17 MED ORDER — ONDANSETRON HCL 4 MG/2ML IJ SOLN
INTRAMUSCULAR | Status: AC
  Filled 2020-09-17: qty 2

## 2020-09-17 MED ORDER — HYDRALAZINE HCL 50 MG PO TABS
100.0000 mg | ORAL_TABLET | Freq: Three times a day (TID) | ORAL | Status: DC
  Administered 2020-09-18 – 2020-09-22 (×14): 100 mg via ORAL
  Filled 2020-09-17 (×14): qty 2

## 2020-09-17 MED ORDER — CHLORHEXIDINE GLUCONATE 0.12 % MT SOLN
15.0000 mL | Freq: Once | OROMUCOSAL | Status: AC
  Administered 2020-09-17: 15 mL via OROMUCOSAL
  Filled 2020-09-17: qty 15

## 2020-09-17 MED ORDER — LIDOCAINE 2% (20 MG/ML) 5 ML SYRINGE
INTRAMUSCULAR | Status: AC
  Filled 2020-09-17: qty 5

## 2020-09-17 MED ORDER — ONDANSETRON HCL 4 MG/2ML IJ SOLN
INTRAMUSCULAR | Status: DC | PRN
  Administered 2020-09-17: 4 mg via INTRAVENOUS

## 2020-09-17 MED ORDER — ATENOLOL 50 MG PO TABS
50.0000 mg | ORAL_TABLET | Freq: Two times a day (BID) | ORAL | Status: DC
  Administered 2020-09-18 – 2020-09-22 (×9): 50 mg via ORAL
  Filled 2020-09-17 (×10): qty 1

## 2020-09-17 MED ORDER — BENAZEPRIL-HYDROCHLOROTHIAZIDE 20-25 MG PO TABS: 1.0000 | ORAL_TABLET | Freq: Every day | ORAL | Status: DC

## 2020-09-17 MED ORDER — IPRATROPIUM-ALBUTEROL 0.5-2.5 (3) MG/3ML IN SOLN
3.0000 mL | Freq: Once | RESPIRATORY_TRACT | Status: AC
  Administered 2020-09-17: 3 mL via RESPIRATORY_TRACT

## 2020-09-17 MED ORDER — BISACODYL 5 MG PO TBEC
10.0000 mg | DELAYED_RELEASE_TABLET | Freq: Every day | ORAL | Status: DC
  Administered 2020-09-18 – 2020-09-22 (×5): 10 mg via ORAL
  Filled 2020-09-17 (×6): qty 2

## 2020-09-17 MED ORDER — 0.9 % SODIUM CHLORIDE (POUR BTL) OPTIME
TOPICAL | Status: DC | PRN
  Administered 2020-09-17: 2000 mL

## 2020-09-17 MED ORDER — METHIMAZOLE 5 MG PO TABS
2.5000 mg | ORAL_TABLET | Freq: Every day | ORAL | Status: DC
  Administered 2020-09-18 – 2020-09-22 (×5): 2.5 mg via ORAL
  Filled 2020-09-17 (×5): qty 1

## 2020-09-17 MED ORDER — GLYCOPYRROLATE PF 0.2 MG/ML IJ SOSY
PREFILLED_SYRINGE | INTRAMUSCULAR | Status: DC | PRN
  Administered 2020-09-17: .2 mg via INTRAVENOUS

## 2020-09-17 MED ORDER — AMLODIPINE BESYLATE 10 MG PO TABS
10.0000 mg | ORAL_TABLET | Freq: Every day | ORAL | Status: DC
  Administered 2020-09-18 – 2020-09-22 (×5): 10 mg via ORAL
  Filled 2020-09-17 (×5): qty 1

## 2020-09-17 MED ORDER — SIMVASTATIN 20 MG PO TABS
20.0000 mg | ORAL_TABLET | Freq: Every day | ORAL | Status: DC
  Administered 2020-09-17 – 2020-09-21 (×5): 20 mg via ORAL
  Filled 2020-09-17 (×5): qty 1

## 2020-09-17 MED ORDER — PROPOFOL 1000 MG/100ML IV EMUL
INTRAVENOUS | Status: AC
  Filled 2020-09-17: qty 100

## 2020-09-17 MED ORDER — CEFAZOLIN SODIUM-DEXTROSE 2-4 GM/100ML-% IV SOLN
2.0000 g | Freq: Three times a day (TID) | INTRAVENOUS | Status: AC
  Administered 2020-09-17 (×2): 2 g via INTRAVENOUS
  Filled 2020-09-17 (×2): qty 100

## 2020-09-17 MED ORDER — FENTANYL CITRATE (PF) 100 MCG/2ML IJ SOLN
INTRAMUSCULAR | Status: AC
  Filled 2020-09-17: qty 2

## 2020-09-17 MED ORDER — LACTATED RINGERS IV SOLN: INTRAVENOUS | Status: DC

## 2020-09-17 MED ORDER — MIDAZOLAM HCL 2 MG/2ML IJ SOLN
INTRAMUSCULAR | Status: AC
  Filled 2020-09-17: qty 2

## 2020-09-17 MED ORDER — SUCCINYLCHOLINE CHLORIDE 200 MG/10ML IV SOSY
PREFILLED_SYRINGE | INTRAVENOUS | Status: DC | PRN
  Administered 2020-09-17: 140 mg via INTRAVENOUS

## 2020-09-17 MED ORDER — ASPIRIN EC 81 MG PO TBEC
81.0000 mg | DELAYED_RELEASE_TABLET | Freq: Every day | ORAL | Status: DC
  Administered 2020-09-18 – 2020-09-22 (×5): 81 mg via ORAL
  Filled 2020-09-17 (×5): qty 1

## 2020-09-17 MED ORDER — PHENYLEPHRINE HCL-NACL 20-0.9 MG/250ML-% IV SOLN
INTRAVENOUS | Status: DC | PRN
  Administered 2020-09-17: 50 ug/min via INTRAVENOUS

## 2020-09-17 MED ORDER — METOCLOPRAMIDE HCL 5 MG/ML IJ SOLN
10.0000 mg | Freq: Four times a day (QID) | INTRAMUSCULAR | Status: AC
  Administered 2020-09-17 – 2020-09-18 (×4): 10 mg via INTRAVENOUS
  Filled 2020-09-17 (×4): qty 2

## 2020-09-17 MED ORDER — FENTANYL CITRATE (PF) 100 MCG/2ML IJ SOLN
25.0000 ug | INTRAMUSCULAR | Status: DC | PRN
  Administered 2020-09-17 (×2): 25 ug via INTRAVENOUS

## 2020-09-17 MED ORDER — BENAZEPRIL HCL 20 MG PO TABS
20.0000 mg | ORAL_TABLET | Freq: Every day | ORAL | Status: DC
  Filled 2020-09-17: qty 1

## 2020-09-17 MED ORDER — INSULIN ASPART 100 UNIT/ML IJ SOLN
0.0000 [IU] | INTRAMUSCULAR | Status: DC
  Administered 2020-09-17: 2 [IU] via SUBCUTANEOUS
  Administered 2020-09-17: 4 [IU] via SUBCUTANEOUS
  Administered 2020-09-18 (×3): 2 [IU] via SUBCUTANEOUS
  Administered 2020-09-19: 4 [IU] via SUBCUTANEOUS
  Administered 2020-09-19 – 2020-09-20 (×4): 2 [IU] via SUBCUTANEOUS
  Administered 2020-09-20: 4 [IU] via SUBCUTANEOUS
  Administered 2020-09-21 (×2): 2 [IU] via SUBCUTANEOUS

## 2020-09-17 MED ORDER — BUPIVACAINE LIPOSOME 1.3 % IJ SUSP
INTRAMUSCULAR | Status: AC
  Filled 2020-09-17: qty 20

## 2020-09-17 MED ORDER — ALBUTEROL SULFATE (2.5 MG/3ML) 0.083% IN NEBU
3.0000 mL | INHALATION_SOLUTION | Freq: Four times a day (QID) | RESPIRATORY_TRACT | Status: DC | PRN
  Administered 2020-09-19: 3 mL via RESPIRATORY_TRACT
  Filled 2020-09-17: qty 3

## 2020-09-17 MED ORDER — ROCURONIUM BROMIDE 10 MG/ML (PF) SYRINGE
PREFILLED_SYRINGE | INTRAVENOUS | Status: DC | PRN
  Administered 2020-09-17 (×2): 20 mg via INTRAVENOUS
  Administered 2020-09-17: 50 mg via INTRAVENOUS
  Administered 2020-09-17: 30 mg via INTRAVENOUS

## 2020-09-17 MED ORDER — ACETAMINOPHEN 160 MG/5ML PO SOLN
1000.0000 mg | Freq: Four times a day (QID) | ORAL | Status: AC
  Filled 2020-09-17: qty 40.6

## 2020-09-17 MED ORDER — SODIUM CHLORIDE 0.9 % IV SOLN: INTRAVENOUS | Status: DC | PRN

## 2020-09-17 MED ORDER — SUGAMMADEX SODIUM 200 MG/2ML IV SOLN
INTRAVENOUS | Status: DC | PRN
  Administered 2020-09-17: 150 mg via INTRAVENOUS
  Administered 2020-09-17: 50 mg via INTRAVENOUS

## 2020-09-17 MED ORDER — SENNOSIDES-DOCUSATE SODIUM 8.6-50 MG PO TABS
1.0000 | ORAL_TABLET | Freq: Every day | ORAL | Status: DC
  Administered 2020-09-17 – 2020-09-21 (×5): 1 via ORAL
  Filled 2020-09-17 (×5): qty 1

## 2020-09-17 MED ORDER — ACETAMINOPHEN 10 MG/ML IV SOLN
INTRAVENOUS | Status: DC | PRN
  Administered 2020-09-17: 1000 mg via INTRAVENOUS

## 2020-09-17 MED ORDER — LACTATED RINGERS IV SOLN: INTRAVENOUS | Status: DC | PRN

## 2020-09-17 MED ORDER — PHENYLEPHRINE 40 MCG/ML (10ML) SYRINGE FOR IV PUSH (FOR BLOOD PRESSURE SUPPORT)
PREFILLED_SYRINGE | INTRAVENOUS | Status: AC
  Filled 2020-09-17: qty 20

## 2020-09-17 MED ORDER — DEXAMETHASONE SODIUM PHOSPHATE 10 MG/ML IJ SOLN
INTRAMUSCULAR | Status: DC | PRN
  Administered 2020-09-17: 5 mg via INTRAVENOUS

## 2020-09-17 MED ORDER — HYDROCHLOROTHIAZIDE 25 MG PO TABS: 25.0000 mg | ORAL_TABLET | Freq: Every day | ORAL | Status: DC

## 2020-09-17 MED ORDER — LIDOCAINE 2% (20 MG/ML) 5 ML SYRINGE
INTRAMUSCULAR | Status: DC | PRN
  Administered 2020-09-17: 100 mg via INTRAVENOUS

## 2020-09-17 MED ORDER — GUAIFENESIN ER 600 MG PO TB12: 600.0000 mg | ORAL_TABLET | Freq: Two times a day (BID) | ORAL | Status: DC | PRN

## 2020-09-17 SURGICAL SUPPLY — 81 items
ADH SKN CLS APL DERMABOND .7 (GAUZE/BANDAGES/DRESSINGS) ×1
APL PRP STRL LF DISP 70% ISPRP (MISCELLANEOUS) ×1
BAG TISS RTRVL C300 12X14 (MISCELLANEOUS) ×1
BLADE STERNUM SYSTEM 6 (BLADE) IMPLANT
CANNULA REDUC XI 12-8 STAPL (CANNULA) ×2
CANNULA REDUCER 12-8 DVNC XI (CANNULA) ×1 IMPLANT
CHLORAPREP W/TINT 26 (MISCELLANEOUS) ×2 IMPLANT
CONNECTOR BLAKE 2:1 CARIO BLK (MISCELLANEOUS) IMPLANT
DEFOGGER SCOPE WARMER CLEARIFY (MISCELLANEOUS) ×2 IMPLANT
DERMABOND ADVANCED (GAUZE/BANDAGES/DRESSINGS) ×1
DERMABOND ADVANCED .7 DNX12 (GAUZE/BANDAGES/DRESSINGS) ×1 IMPLANT
DRAIN CHANNEL 19F RND (DRAIN) ×1 IMPLANT
DRAIN CONNECTOR BLAKE 1:1 (MISCELLANEOUS) ×1 IMPLANT
DRAPE ARM DVNC X/XI (DISPOSABLE) ×4 IMPLANT
DRAPE CARDIOVASC SPLIT 88X140 (DRAPES) IMPLANT
DRAPE CARDIOVASCULAR INCISE (DRAPES)
DRAPE COLUMN DVNC XI (DISPOSABLE) ×1 IMPLANT
DRAPE DA VINCI XI ARM (DISPOSABLE) ×8
DRAPE DA VINCI XI COLUMN (DISPOSABLE) ×2
DRAPE ORTHO SPLIT 77X108 STRL (DRAPES)
DRAPE SRG 135X102X78XABS (DRAPES) IMPLANT
DRAPE SURG ORHT 6 SPLT 77X108 (DRAPES) IMPLANT
DRAPE WARM FLUID 44X44 (DRAPES) IMPLANT
DRSG AQUACEL AG ADV 3.5X14 (GAUZE/BANDAGES/DRESSINGS) IMPLANT
ELECT REM PT RETURN 9FT ADLT (ELECTROSURGICAL)
ELECTRODE REM PT RTRN 9FT ADLT (ELECTROSURGICAL) IMPLANT
FELT TEFLON 1X6 (MISCELLANEOUS) IMPLANT
GAUZE KITTNER 4X5 RF (MISCELLANEOUS) ×1 IMPLANT
GAUZE KITTNER 4X8 (MISCELLANEOUS) ×1 IMPLANT
GAUZE SPONGE 4X4 12PLY STRL (GAUZE/BANDAGES/DRESSINGS) IMPLANT
GAUZE SPONGE 4X4 12PLY STRL LF (GAUZE/BANDAGES/DRESSINGS) ×1 IMPLANT
GLOVE SURG ENC MOIS LTX SZ7 (GLOVE) ×4 IMPLANT
GOWN STRL REUS W/ TWL LRG LVL3 (GOWN DISPOSABLE) ×1 IMPLANT
GOWN STRL REUS W/ TWL XL LVL3 (GOWN DISPOSABLE) ×1 IMPLANT
GOWN STRL REUS W/TWL LRG LVL3 (GOWN DISPOSABLE) ×6
GOWN STRL REUS W/TWL XL LVL3 (GOWN DISPOSABLE) ×2
HEMOSTAT POWDER SURGIFOAM 1G (HEMOSTASIS) IMPLANT
IRRIGATION STRYKERFLOW (MISCELLANEOUS) ×1 IMPLANT
IRRIGATOR STRYKERFLOW (MISCELLANEOUS) ×2
IRRIGATOR SUCT 8 DISP DVNC XI (IRRIGATION / IRRIGATOR) IMPLANT
IRRIGATOR SUCTION 8MM XI DISP (IRRIGATION / IRRIGATOR) ×2
KIT SUCTION CATH 14FR (SUCTIONS) IMPLANT
NDL HYPO 25GX1X1/2 BEV (NEEDLE) IMPLANT
NEEDLE HYPO 25GX1X1/2 BEV (NEEDLE) IMPLANT
PACK CHEST (CUSTOM PROCEDURE TRAY) ×2 IMPLANT
PAD ARMBOARD 7.5X6 YLW CONV (MISCELLANEOUS) ×4 IMPLANT
PAD ELECT DEFIB RADIOL ZOLL (MISCELLANEOUS) ×2 IMPLANT
SEAL CANN UNIV 5-8 DVNC XI (MISCELLANEOUS) ×3 IMPLANT
SEAL XI 5MM-8MM UNIVERSAL (MISCELLANEOUS) ×6
SEALER VESSEL DA VINCI XI (MISCELLANEOUS) ×2
SEALER VESSEL EXT DVNC XI (MISCELLANEOUS) IMPLANT
SET TRI-LUMEN FLTR TB AIRSEAL (TUBING) ×2 IMPLANT
SHEET MEDIUM DRAPE 40X70 STRL (DRAPES) ×2 IMPLANT
SOLUTION ELECTROLUBE (MISCELLANEOUS) ×2 IMPLANT
STAPLER CANNULA SEAL DVNC XI (STAPLE) IMPLANT
STAPLER CANNULA SEAL XI (STAPLE)
STOPCOCK 4 WAY LG BORE MALE ST (IV SETS) IMPLANT
SUT BONE WAX W31G (SUTURE) IMPLANT
SUT MNCRL AB 3-0 PS2 18 (SUTURE) IMPLANT
SUT PDS AB 1 CTX 36 (SUTURE) IMPLANT
SUT SILK  1 MH (SUTURE)
SUT SILK 1 MH (SUTURE) IMPLANT
SUT SILK 2 0 SH CR/8 (SUTURE) ×1 IMPLANT
SUT STEEL 6MS V (SUTURE) IMPLANT
SUT STEEL SZ 6 DBL 3X14 BALL (SUTURE) IMPLANT
SUT VIC AB 2-0 CT1 27 (SUTURE) ×2
SUT VIC AB 2-0 CT1 TAPERPNT 27 (SUTURE) IMPLANT
SUT VIC AB 2-0 CTX 36 (SUTURE) IMPLANT
SUT VIC AB 3-0 SH 27 (SUTURE) ×4
SUT VIC AB 3-0 SH 27X BRD (SUTURE) IMPLANT
SUT VICRYL 0 UR6 27IN ABS (SUTURE) ×4 IMPLANT
SYR 10ML LL (SYRINGE) ×2 IMPLANT
SYR 50ML LL SCALE MARK (SYRINGE) ×2 IMPLANT
SYSTEM RETRIEVAL ANCHOR 12 (MISCELLANEOUS) ×1 IMPLANT
SYSTEM SAHARA CHEST DRAIN ATS (WOUND CARE) IMPLANT
TAPE CLOTH 4X10 WHT NS (GAUZE/BANDAGES/DRESSINGS) ×1 IMPLANT
TOWEL GREEN STERILE (TOWEL DISPOSABLE) IMPLANT
TOWEL GREEN STERILE FF (TOWEL DISPOSABLE) IMPLANT
TRAY FOLEY SLVR 16FR TEMP STAT (SET/KITS/TRAYS/PACK) IMPLANT
TROCAR PORT AIRSEAL 8X120 (TROCAR) ×1 IMPLANT
TUBING EXTENTION W/L.L. (IV SETS) ×2 IMPLANT

## 2020-09-17 NOTE — Op Note (Signed)
      [address]       [place] [address]             [phone]        09/17/2020  Patient:  [name] Pre-Op Dx: Anterior mediastinal mass Post-op Dx: Same Procedure: - Robotic assisted right video thoracoscopy -Resection of anterior mediastinal mass  Surgeon and Role:      * Delio Slates, Lucile Crater, MD - Primary    *E. Barrett, PA-C- assisting  Anesthesia  general EBL: 50 ml Blood Administration: None Specimen: Mediastinal mass  Drains: 19 F chest tube in right chest Counts: correct   Indications: 80 year old female with a history of anterior mediastinal mass that was found incidentally when she was being evaluated and treated for COVID-19.  She underwent a PET/CT which showed some avidity.  All of her tumor markers have been negative.  After much discussion with her and her family we agree that the best course of action will be to remove this mass.  She was agree to proceed that she presents today for surgery.  Findings: Large anterior mediastinal mass.  There was no evidence of invasion of the pericardium or vascular structures the mediastinum.  Operative Technique: After the risks, benefits and alternatives were thoroughly discussed, the patient was brought to the operative theatre.  Anesthesia was induced, and the patient was then placed in a left lazy lateral decubitus position and was prepped and draped in normal sterile fashion.  An appropriate surgical pause was performed, and pre-operative antibiotics were dosed accordingly.  We began by placing our 3 robotic ports in the the intercostal spaces targeting the pericardium.  A 8 mm assistant port was placed in the 9th intercostal space in the posterior axillary line.  The robot was then docked and all instruments were passed under direct visualization.      We began by using a combination of cautery and blunt dissection to mobilize the mediastinal mass off the pericardium.  We took this all the  way up into the neck.  We did enter the left pleural space to obtain a full dissection.  Once this was performed we then mobilized the mass off of the undersurface of the sternum.  We mobilized it up into the neck and divided the horns of the thymus gland.  Once it was completely dissected a 12 mm Endo Catch bag was placed through the assistant port, the specimen was passed into the back.  A small incision was made to extend the assistant port the specimen was removed without difficulty.  We then checked for bleeding and there was no evidence of this.  The robot was then undocked and all trocar sites were removed.  Exparel was injected to all of the trocar sites.  A 19 [place] Blake was placed through the inferior most trocar site and passed into the left chest.  All the port sites were closed with absorbable suture.  The patient tolerated the procedure without any immediate complications, and was transferred to the PACU in stable condition.  Jacolyn Joaquin Bary Leriche

## 2020-09-17 NOTE — Anesthesia Postprocedure Evaluation (Signed)
Anesthesia Post Note  Patient: Engineer, site  Procedure(s) Performed: RIGHT XI ROBOTIC ASSISTED RESECTION OF MEDIASTINAL MASS (Right: Chest)     Patient location during evaluation: PACU Anesthesia Type: General Level of consciousness: awake Pain management: pain level controlled Vital Signs Assessment: post-procedure vital signs reviewed and stable Respiratory status: spontaneous breathing Cardiovascular status: stable Postop Assessment: no apparent nausea or vomiting Anesthetic complications: no   No notable events documented.  Last Vitals:  Vitals:   09/17/20 1510 09/17/20 1540  BP: (!) 164/88 135/66  Pulse: 76 74  Resp: (!) 21 (!) 33  Temp:    SpO2: 93% 95%    Last Pain:  Vitals:   09/17/20 1540  TempSrc:   PainSc: 0-No pain                 Arina Torry

## 2020-09-17 NOTE — Anesthesia Postprocedure Evaluation (Signed)
Anesthesia Post Note  Patient: Mandy [name], site  Procedure(s) Performed: RIGHT XI ROBOTIC ASSISTED RESECTION OF MEDIASTINAL MASS (Right: Chest)     Patient location during evaluation: PACU Anesthesia Type: General Level of consciousness: awake Pain management: pain level controlled Vital Signs Assessment: post-procedure vital signs reviewed and stable Respiratory status: spontaneous breathing Cardiovascular status: stable Postop Assessment: no apparent nausea or vomiting Anesthetic complications: no   No notable events documented.  Last Vitals:  Vitals:   09/17/20 0741 09/17/20 1109  BP:  128/80  Pulse: 68 73  Resp: (!) 26 18  Temp:    SpO2: 100% 99%    Last Pain:  Vitals:   09/17/20 0610  TempSrc:   PainSc: 0-No pain                 Burna Atlas

## 2020-09-17 NOTE — Anesthesia Procedure Notes (Addendum)
  Central Venous Catheter Insertion Performed by: Belinda Block, MD, anesthesiologist Start/End9/15/2022 7:15 AM, 09/17/2020 7:30 AM Preanesthetic checklist: patient identified, IV checked, site marked, risks and benefits discussed, surgical consent, monitors and equipment checked, pre-op evaluation and timeout performed Lidocaine 1% used for infiltration Hand hygiene performed , maximum sterile barriers used  and Seldinger technique used Central line was placed.Double lumen Procedure performed using ultrasound guided technique. Ultrasound Notes:anatomy identified, no ultrasound evidence of intravascular and/or intraneural injection and image(s) printed for medical record Attempts: 1 Following insertion, line sutured, dressing applied and Biopatch.

## 2020-09-17 NOTE — Anesthesia Procedure Notes (Signed)
Arterial Line Insertion Start/End9/15/2022 7:15 AM, 09/17/2020 7:18 AM Performed by: CRNA  Patient location: Pre-op. Preanesthetic checklist: patient identified, IV checked, site marked, risks and benefits discussed, surgical consent, monitors and equipment checked, pre-op evaluation, timeout performed and anesthesia consent Lidocaine 1% used for infiltration Right, radial was placed Catheter size: 20 G Hand hygiene performed , maximum sterile barriers used  and Seldinger technique used Allen's test indicative of satisfactory collateral circulation Attempts: 1 Procedure performed without using ultrasound guided technique. Following insertion, dressing applied and Biopatch. Post procedure assessment: normal  Patient tolerated the procedure well with no immediate complications.

## 2020-09-17 NOTE — Transfer of Care (Signed)
Immediate Anesthesia Transfer of Care Note  Patient: Mandy [name]  Procedure(s) Performed: RIGHT XI ROBOTIC ASSISTED RESECTION OF MEDIASTINAL MASS (Right: Chest)  Patient Location: PACU  Anesthesia Type:General  Level of Consciousness: drowsy and patient cooperative  Airway & Oxygen Therapy: Patient Spontanous Breathing and Patient connected to face mask oxygen  Post-op Assessment: Report given to RN and Post -op Vital signs reviewed and stable  Post vital signs: Reviewed and stable  Last Vitals:  Vitals Value Taken Time  BP 128/80 09/17/20 1109  Temp    Pulse 73 09/17/20 1113  Resp 17 09/17/20 1113  SpO2 100 % 09/17/20 1113  Vitals shown include unvalidated device data.  Last Pain:  Vitals:   09/17/20 0610  TempSrc:   PainSc: 0-No pain         Complications: No notable events documented.

## 2020-09-17 NOTE — Brief Op Note (Signed)
09/17/2020  10:55 AM  PATIENT:  Mandy [name]  80 y.o. female  PRE-OPERATIVE DIAGNOSIS:  Mediastinal Mass  POST-OPERATIVE DIAGNOSIS:  Mediastinal Mass  PROCEDURE:  Procedure(s): RIGHT XI ROBOTIC ASSISTED RESECTION OF MEDIASTINAL MASS (Right)  SURGEON:  Surgeon(s) and Role:    * Lightfoot, Lucile Crater, MD - Primary  PHYSICIAN ASSISTANT: Ellwood Handler PA-C  ASSISTANTS: none   ANESTHESIA:   general  EBL:  Per anesthesia record  BLOOD ADMINISTERED:none  DRAINS:  19 [address] Drain    LOCAL MEDICATIONS USED:  BUPIVICAINE   SPECIMEN:  Source of Specimen:  Anterior Mediastinal Mass  DISPOSITION OF SPECIMEN:  PATHOLOGY  COUNTS:  YES  TOURNIQUET:  * No tourniquets in log *  DICTATION: .Dragon Dictation  PLAN OF CARE: Admit to inpatient   PATIENT DISPOSITION:  PACU - hemodynamically stable.   Delay start of Pharmacological VTE agent (>24hrs) due to surgical blood loss or risk of bleeding: yes

## 2020-09-17 NOTE — Hospital Course (Addendum)
History of Present Illness:  Mandy [name] is a 80 yo female who was recently diagnosed with COVID 19 infection.  She also has a past medical history of DM, Hyperlipidemia, HTN, and Hyperthyroidism.  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  She was evaluated by Dr. Kipp Brood who recommended PET CT.  This showed the lesion to be mildly hypermetabolic.  It was felt she should undergo surgical resection of the mass.  The risks and benefits of the procedure were explained to the patient and she was agreeable to proceed.  Hospital Course:  [name] presented to [facility] on 09/17/2020.  She underwent Robotic Assisted Resection of Anterior Mediastinal Mass.  She tolerated the procedure without difficulty, was extubated, and taken to the PACU in stable condition.  Her arterial line was removed without difficulty.  On postoperative day #1 she was hemodynamically stable with some elevated blood pressure readings.  She was maintaining good oxygen saturations on 2 L of nasal cannula supplemental O2.  Her chest tube was noted to have 300 cc and we have kept in place.  He did have a acute elevation of her creatinine to 1.65 and she has had her Toradol and ARB discontinued for now and also started on some intravenous fluids.  She is noted to have an expected acute blood loss anemia, not at the transfusion threshold and is being monitored.  She is starting a course of routine pulmonary hygiene as well as rehabilitation. 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 We are weaning her from her oxygen requirements. Today, she is tolerating room air, she is walking independently in the halls, her incisions are healing well, and she is ready for discharge home.

## 2020-09-17 NOTE — Interval H&P Note (Signed)
History and Physical Interval Note:  09/17/2020 8:29 AM  Mandy [name]  has presented today for surgery, with the diagnosis of Mediastinal Mass.  The various methods of treatment have been discussed with the patient and family. After consideration of risks, benefits and other options for treatment, the patient has consented to  Procedure(s): RIGHT XI ROBOTIC ASSISTED RESECTION OF MEDIASTINAL MASS (Right) as a surgical intervention.  The patient's history has been reviewed, patient examined, no change in status, stable for surgery.  I have reviewed the patient's chart and labs.  Questions were answered to the patient's satisfaction.     Jacere Pangborn Bary Leriche

## 2020-09-17 NOTE — Anesthesia Procedure Notes (Signed)
Procedure Name: Intubation Date/Time: 09/17/2020 8:00 AM Performed by: Renato Shin, CRNA Pre-anesthesia Checklist: Patient identified, Emergency Drugs available, Suction available and Patient being monitored Patient Re-evaluated:Patient Re-evaluated prior to induction Oxygen Delivery Method: Circle system utilized Preoxygenation: Pre-oxygenation with 100% oxygen Induction Type: IV induction Ventilation: Two handed mask ventilation required Laryngoscope Size: Miller and 2 Grade View: Grade I Endobronchial tube: Left, Double lumen EBT, EBT position confirmed by auscultation and EBT position confirmed by fiberoptic bronchoscope and 35 Fr Number of attempts: 2 Airway Equipment and Method: Rigid stylet Placement Confirmation: ETT inserted through vocal cords under direct vision, positive ETCO2 and breath sounds checked- equal and bilateral Tube secured with: Tape Dental Injury: Teeth and Oropharynx as per pre-operative assessment  Comments: First attempt by Sonia Baller with glidescope. Glottic opening easily visualized. DLT unable to make bend to pass through glottic opening. VSS. DL x1 Mil2, grade 1 view. DLT pass with ease.

## 2020-09-18 ENCOUNTER — Inpatient Hospital Stay (HOSPITAL_COMMUNITY): Payer: Medicare HMO

## 2020-09-18 LAB — SURGICAL PATHOLOGY

## 2020-09-18 LAB — GLUCOSE, CAPILLARY
Glucose-Capillary: 107 mg/dL — ABNORMAL HIGH (ref 70–99)
Glucose-Capillary: 135 mg/dL — ABNORMAL HIGH (ref 70–99)
Glucose-Capillary: 137 mg/dL — ABNORMAL HIGH (ref 70–99)
Glucose-Capillary: 90 mg/dL (ref 70–99)
Glucose-Capillary: 140 mg/dL — ABNORMAL HIGH (ref 70–99)
Glucose-Capillary: 125 mg/dL — ABNORMAL HIGH (ref 70–99)

## 2020-09-18 LAB — CBC
HCT: 28.7 % — ABNORMAL LOW (ref 36.0–46.0)
Hemoglobin: 9.4 g/dL — ABNORMAL LOW (ref 12.0–15.0)
MCH: 26.4 pg (ref 26.0–34.0)
MCHC: 32.8 g/dL (ref 30.0–36.0)
MCV: 80.6 fL (ref 80.0–100.0)
Platelets: 305 10*3/uL (ref 150–400)
RBC: 3.56 MIL/uL — ABNORMAL LOW (ref 3.87–5.11)
RDW: 15.2 % (ref 11.5–15.5)
WBC: 8 10*3/uL (ref 4.0–10.5)
nRBC: 0 % (ref 0.0–0.2)

## 2020-09-18 LAB — BASIC METABOLIC PANEL
Anion gap: 12 (ref 5–15)
BUN: 16 mg/dL (ref 8–23)
CO2: 30 mmol/L (ref 22–32)
Calcium: 8.8 mg/dL — ABNORMAL LOW (ref 8.9–10.3)
Chloride: 87 mmol/L — ABNORMAL LOW (ref 98–111)
Creatinine, Ser: 1.64 mg/dL — ABNORMAL HIGH (ref 0.44–1.00)
GFR, Estimated: 32 mL/min — ABNORMAL LOW (ref >60)
Glucose, Bld: 106 mg/dL — ABNORMAL HIGH (ref 70–99)
Potassium: 3.5 mmol/L (ref 3.5–5.1)
Sodium: 129 mmol/L — ABNORMAL LOW (ref 135–145)

## 2020-09-18 MED ORDER — SODIUM CHLORIDE 0.9 % IV SOLN: INTRAVENOUS | Status: DC

## 2020-09-18 NOTE — Progress Notes (Signed)
Pt produced 150 ml urine overnight via purewick- bladder scan revealed 322 ml left in bladder. Encouraged pt to try to use bsc- she agreed but was unsuccessful. She wanted to try to drink some water and try again in about an hour or so.

## 2020-09-18 NOTE — Progress Notes (Addendum)
[address]       [place] [address]             [phone]      1 Day Post-Op Procedure(s) (LRB): RIGHT XI ROBOTIC ASSISTED RESECTION OF MEDIASTINAL MASS (Right) Subjective: Some pain but mostly feels well  Objective: Vital signs in last 24 hours: Temp:  [97.1 F (36.2 C)-98.6 F (37 C)] 97.7 F (36.5 C) (09/16 0319) Pulse Rate:  [66-80] 68 (09/16 0319) Cardiac Rhythm: Normal sinus rhythm (09/15 2100) Resp:  [14-33] 18 (09/16 0319) BP: (114-164)/(52-95) 114/54 (09/16 0319) SpO2:  [92 %-100 %] 99 % (09/16 0319) Arterial Line BP: (153-174)/(49-100) 165/100 (09/15 1410)  Hemodynamic parameters for last 24 hours:    Intake/Output from previous day: 09/15 0701 - 09/16 0700 In: 1200 [I.V.:1100; IV Piggyback:100] Out: 677 [Urine:325; Blood:50; Chest Tube:302] Intake/Output this shift: No intake/output data recorded.  General appearance: alert, cooperative, and no distress Heart: regular rate and rhythm Lungs: mildly dim in bases Abdomen: soft, non tender Extremities: PAS in place, no edema Wound: incis healing well  Lab Results: Recent Labs    09/16/20 0900 09/18/20 0500  WBC 5.9 8.0  HGB 11.8* 9.4*  HCT 35.2* 28.7*  PLT 372 305   BMET:  Recent Labs    09/16/20 0900 09/18/20 0500  NA 129* 129*  K 3.3* 3.5  CL 88* 87*  CO2 25 30  GLUCOSE 166* 106*  BUN 15 16  CREATININE 1.12* 1.64*  CALCIUM 9.3 8.8*    PT/INR:  Recent Labs    09/16/20 0900  LABPROT 13.2  INR 1.0   ABG    Component Value Date/Time   PHART 7.428 09/16/2020 0944   HCO3 27.6 09/16/2020 0944   TCO2 34 (H) 06/26/2020 0914   O2SAT 97.2 09/16/2020 0944   CBG (last 3)  Recent Labs    09/17/20 2012 09/17/20 2330 09/18/20 0318  GLUCAP 199* 129* 107*    Meds Scheduled Meds:  acetaminophen  1,000 mg Oral Q6H   Or   acetaminophen (TYLENOL) oral liquid 160 mg/5 mL  1,000 mg Oral Q6H   amLODipine  10 mg Oral Daily   aspirin EC  81 mg Oral Daily    atenolol  50 mg Oral BID   benazepril  20 mg Oral Daily   And   hydrochlorothiazide  25 mg Oral Daily   bisacodyl  10 mg Oral Daily   Chlorhexidine Gluconate Cloth  6 each Topical Daily   gabapentin  100 mg Oral QHS   hydrALAZINE  100 mg Oral TID   insulin aspart  0-24 Units Subcutaneous Q4H   ketorolac  15 mg Intravenous Q6H   methimazole  2.5 mg Oral Daily   metoCLOPramide (REGLAN) injection  10 mg Intravenous Q6H   pantoprazole  40 mg Oral QHS   senna-docusate  1 tablet Oral QHS   simvastatin  20 mg Oral QHS   Continuous Infusions: PRN Meds:.albuterol, diclofenac sodium, guaiFENesin, morphine injection, traMADol  Xrays DG Chest 2 View  Result Date: 09/16/2020 CLINICAL DATA:  80 year old female with preoperative chest x-ray EXAM: CHEST - 2 VIEW COMPARISON:  06/26/2020, chest CT 06/26/2020 FINDINGS: Cardiomediastinal silhouette unchanged in size and contour. No pneumothorax.  No pleural effusion. No confluent airspace disease. Coarsened interstitial markings similar to the prior. Overall improved aeration compared to the prior plain film. IMPRESSION: Negative for acute cardiopulmonary disease, with improved aeration compared to the prior plain film. Electronically Signed   By:  Corrie Mckusick D.O.   On: 09/16/2020 13:00   DG Chest Port 1 View  Result Date: 09/17/2020 CLINICAL DATA:  Chest tube status post video assisted thoracic surgery. EXAM: PORTABLE CHEST 1 VIEW COMPARISON:  September 16, 2020.  August 11, 2020. FINDINGS: Stable cardiomegaly. Right-sided chest tube is now noted which appears to cross the midline, presumably into the anterior mediastinum. No pneumothorax is noted. Right internal jugular catheter is noted with tip in expected position of cavoatrial junction. Minimal right basilar subsegmental atelectasis is noted. Left basilar atelectasis is noted with small left pleural effusion. Bony thorax is unremarkable. IMPRESSION: Right-sided chest tube is now noted which appears to  cross midline, presumably into the anterior mediastinum. No pneumothorax is noted. Left basilar atelectasis is noted with small left pleural effusion. Electronically Signed   By: Marijo Conception M.D.   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

## 2020-09-19 ENCOUNTER — Inpatient Hospital Stay (HOSPITAL_COMMUNITY): Payer: Medicare HMO

## 2020-09-19 LAB — COMPREHENSIVE METABOLIC PANEL
ALT: 5 U/L (ref 0–44)
AST: 19 U/L (ref 15–41)
Albumin: 3.1 g/dL — ABNORMAL LOW (ref 3.5–5.0)
Alkaline Phosphatase: 30 U/L — ABNORMAL LOW (ref 38–126)
Anion gap: 10 (ref 5–15)
BUN: 17 mg/dL (ref 8–23)
CO2: 28 mmol/L (ref 22–32)
Calcium: 8.5 mg/dL — ABNORMAL LOW (ref 8.9–10.3)
Chloride: 90 mmol/L — ABNORMAL LOW (ref 98–111)
Creatinine, Ser: 1.38 mg/dL — ABNORMAL HIGH (ref 0.44–1.00)
GFR, Estimated: 39 mL/min — ABNORMAL LOW (ref >60)
Glucose, Bld: 102 mg/dL — ABNORMAL HIGH (ref 70–99)
Potassium: 3.3 mmol/L — ABNORMAL LOW (ref 3.5–5.1)
Sodium: 128 mmol/L — ABNORMAL LOW (ref 135–145)
Total Bilirubin: 0.6 mg/dL (ref 0.3–1.2)
Total Protein: 5.7 g/dL — ABNORMAL LOW (ref 6.5–8.1)

## 2020-09-19 LAB — GLUCOSE, CAPILLARY
Glucose-Capillary: 103 mg/dL — ABNORMAL HIGH (ref 70–99)
Glucose-Capillary: 116 mg/dL — ABNORMAL HIGH (ref 70–99)
Glucose-Capillary: 147 mg/dL — ABNORMAL HIGH (ref 70–99)
Glucose-Capillary: 178 mg/dL — ABNORMAL HIGH (ref 70–99)
Glucose-Capillary: 83 mg/dL (ref 70–99)
Glucose-Capillary: 95 mg/dL (ref 70–99)

## 2020-09-19 LAB — CBC
HCT: 29.5 % — ABNORMAL LOW (ref 36.0–46.0)
Hemoglobin: 9.8 g/dL — ABNORMAL LOW (ref 12.0–15.0)
MCH: 26.8 pg (ref 26.0–34.0)
MCHC: 33.2 g/dL (ref 30.0–36.0)
MCV: 80.6 fL (ref 80.0–100.0)
Platelets: 289 10*3/uL (ref 150–400)
RBC: 3.66 MIL/uL — ABNORMAL LOW (ref 3.87–5.11)
RDW: 15.4 % (ref 11.5–15.5)
WBC: 6.3 10*3/uL (ref 4.0–10.5)
nRBC: 0 % (ref 0.0–0.2)

## 2020-09-19 MED ORDER — POTASSIUM CHLORIDE CRYS ER 20 MEQ PO TBCR
20.0000 meq | EXTENDED_RELEASE_TABLET | Freq: Once | ORAL | Status: AC
  Administered 2020-09-19: 20 meq via ORAL
  Filled 2020-09-19: qty 1

## 2020-09-19 MED ORDER — POTASSIUM CHLORIDE CRYS ER 20 MEQ PO TBCR
40.0000 meq | EXTENDED_RELEASE_TABLET | Freq: Once | ORAL | Status: AC
  Administered 2020-09-19: 40 meq via ORAL
  Filled 2020-09-19: qty 2

## 2020-09-19 MED ORDER — TRAMADOL HCL 50 MG PO TABS
50.0000 mg | ORAL_TABLET | Freq: Four times a day (QID) | ORAL | Status: DC | PRN
  Administered 2020-09-21 (×2): 50 mg via ORAL
  Filled 2020-09-19 (×2): qty 1

## 2020-09-19 NOTE — Progress Notes (Addendum)
[address]       [place] [address]             [phone]      2 Days Post-Op Procedure(s) (LRB): RIGHT XI ROBOTIC ASSISTED RESECTION OF MEDIASTINAL MASS (Right) Subjective: Feeling better, ate most of her breakfast. Pain controlled.  O2 at 1L/[place].  Objective: Vital signs in last 24 hours: Temp:  [97.6 F (36.4 C)-98.2 F (36.8 C)] 98.2 F (36.8 C) (09/17 0752) Pulse Rate:  [67-80] 67 (09/17 0752) Cardiac Rhythm: Normal sinus rhythm (09/17 0700) Resp:  [15-16] 16 (09/17 0752) BP: (119-169)/(62-80) 128/62 (09/17 0752) SpO2:  [93 %-99 %] 98 % (09/17 0752)     Intake/Output from previous day: 09/16 0701 - 09/17 0700 In: 1031.4 [P.O.:415; I.V.:616.4] Out: 1050 [Urine:850; Chest Tube:200] Intake/Output this shift: No intake/output data recorded.  General appearance: alert, cooperative, and no distress Heart: regular rate and rhythm Lungs: mildly dim in bases, CT drainage 273m/24 hours.  Abdomen: soft, non tender Wound: Incisions dry. CT secure  Lab Results: Recent Labs    09/18/20 0500 09/19/20 0602  WBC 8.0 6.3  HGB 9.4* 9.8*  HCT 28.7* 29.5*  PLT 305 289    BMET:  Recent Labs    09/18/20 0500 09/19/20 0602  NA 129* 128*  K 3.5 3.3*  CL 87* 90*  CO2 30 28  GLUCOSE 106* 102*  BUN 16 17  CREATININE 1.64* 1.38*  CALCIUM 8.8* 8.5*     PT/INR:  Recent Labs    09/16/20 0900  LABPROT 13.2  INR 1.0    ABG    Component Value Date/Time   PHART 7.428 09/16/2020 0944   HCO3 27.6 09/16/2020 0944   TCO2 34 (H) 06/26/2020 0914   O2SAT 97.2 09/16/2020 0944   CBG (last 3)  Recent Labs    09/18/20 2000 09/18/20 2311 09/19/20 0325  GLUCAP 140* 125* 116*     Meds Scheduled Meds:  acetaminophen  1,000 mg Oral Q6H   Or   acetaminophen (TYLENOL) oral liquid 160 mg/5 mL  1,000 mg Oral Q6H   amLODipine  10 mg Oral Daily   aspirin EC  81 mg Oral Daily   atenolol  50 mg Oral BID   bisacodyl  10 mg Oral Daily    Chlorhexidine Gluconate Cloth  6 each Topical Daily   gabapentin  100 mg Oral QHS   hydrALAZINE  100 mg Oral TID   insulin aspart  0-24 Units Subcutaneous Q4H   methimazole  2.5 mg Oral Daily   pantoprazole  40 mg Oral QHS   potassium chloride  20 mEq Oral Once   potassium chloride  40 mEq Oral Once   senna-docusate  1 tablet Oral QHS   simvastatin  20 mg Oral QHS   Continuous Infusions: PRN Meds:.albuterol, diclofenac sodium, guaiFENesin, morphine injection, traMADol  Xrays   PATH:  SURGICAL PATHOLOGY Surgical pathology Collected: 09/17/20 1010  Lab status: Final-Edited  Resulting lab: [facility]  Value: SURGICAL PATHOLOGY  CASE: [id number]  PATIENT: Mandy [name] Surgical Pathology Report      Clinical History: mediastinal mass (cm)      FINAL MICROSCOPIC DIAGNOSIS:   A. MEDIASTINUM MASS, RESECTION:  - Benign thyroid tissue with nodular hyperplasia.   GROSS DESCRIPTION:   Received fresh is a 74 g, 7.8 x 6.3 by 2.8 cm well-defined soft to  rubbery mass, clinically mediastinal.  There is attached soft lobulated  fatty tissue (not  included in weight).  The mass has a suture at 1  aspect, clinically anterior right pole.  The specimen is inked, and cut  surfaces are diffusely tan-pink to pink-red, nodular/lobulated, with  focal pale yellow nodular induration. Found within fatty tissue adjacent  to area with attached suture is a 0.8 cm tan-red soft well-defined  nodule.  Block summary:  Blocks 1-5 = representative sections of mass, including inked external  surface  Block 6 = separate small nodule, bisected   SW 09/17/2020     Assessment/Plan: S/P Procedure(s) (LRB): RIGHT XI ROBOTIC ASSISTED RESECTION OF MEDIASTINAL MASS (Right)  1 afeb, VSS s BP 110's-160s 2 sats acceptable on 1 liter [place] O2. 3 Renal- UOP improved, creat trending down. Stop IVF. 4 CT 200 cc recorded past 24 hours, remove later today after she walks.  5  hyponatremic  with Na 128- monitor  6 expected ABL anemia- stable,  monitor 7 CXR improved 8 Path: Benign thyroid tissue with nodular hyperplasia  9 hypokalemia-we will supplement today, recheck in a.m.  Plan: D/C IVF, central line, and chest tube. Advance activity. Wean O2 off. Plan for discharge tomorrow.     LOS: 2 days    Mandy Odea PA-C Pager [phone] 09/19/2020   Patient seen and examined, agree with above Dc chest tube. Ambulate  Mandy [name]. Roxan Hockey, MD [facility] [phone]

## 2020-09-20 ENCOUNTER — Inpatient Hospital Stay (HOSPITAL_COMMUNITY): Payer: Medicare HMO

## 2020-09-20 LAB — BASIC METABOLIC PANEL
Anion gap: 7 (ref 5–15)
BUN: 16 mg/dL (ref 8–23)
CO2: 26 mmol/L (ref 22–32)
Calcium: 8.3 mg/dL — ABNORMAL LOW (ref 8.9–10.3)
Chloride: 95 mmol/L — ABNORMAL LOW (ref 98–111)
Creatinine, Ser: 1.3 mg/dL — ABNORMAL HIGH (ref 0.44–1.00)
GFR, Estimated: 42 mL/min — ABNORMAL LOW (ref >60)
Glucose, Bld: 105 mg/dL — ABNORMAL HIGH (ref 70–99)
Potassium: 3.7 mmol/L (ref 3.5–5.1)
Sodium: 128 mmol/L — ABNORMAL LOW (ref 135–145)

## 2020-09-20 LAB — GLUCOSE, CAPILLARY
Glucose-Capillary: 104 mg/dL — ABNORMAL HIGH (ref 70–99)
Glucose-Capillary: 117 mg/dL — ABNORMAL HIGH (ref 70–99)
Glucose-Capillary: 167 mg/dL — ABNORMAL HIGH (ref 70–99)
Glucose-Capillary: 129 mg/dL — ABNORMAL HIGH (ref 70–99)
Glucose-Capillary: 148 mg/dL — ABNORMAL HIGH (ref 70–99)
Glucose-Capillary: 103 mg/dL — ABNORMAL HIGH (ref 70–99)

## 2020-09-20 MED ORDER — GUAIFENESIN ER 600 MG PO TB12
600.0000 mg | ORAL_TABLET | Freq: Two times a day (BID) | ORAL | Status: DC
  Administered 2020-09-20 – 2020-09-22 (×5): 600 mg via ORAL
  Filled 2020-09-20 (×5): qty 1

## 2020-09-20 NOTE — Discharge Instructions (Addendum)
Discharge Instructions:  1. You may shower, please wash incisions daily with soap and water and keep dry.  If you wish to cover wounds with dressing you may do so but please keep clean and change daily.  No tub baths or swimming until incisions have completely healed.  If your incisions become red or develop any drainage please call our office at [phone]  2. No Driving until cleared by Dr. Lightfoot's office and you are no longer using narcotic pain medications  3. Monitor your weight daily.. Please use the same scale and weigh at same time... If you gain 3-5 lbs in 48 hours with associated lower extremity swelling, please contact our office at [phone]  4. Fever of 101.5 for at least 24 hours with no source, please contact our office at [phone]  5. Activity- up as tolerated, please walk at least 3 times per day.  Avoid strenuous activity, no lifting, pushing, or pulling with your arms over 8-10 lbs for a minimum of 6 weeks  6. If any questions or concerns arise, please do not hesitate to contact our office at [phone] 

## 2020-09-20 NOTE — Progress Notes (Addendum)
[address]       [place] [address]             [phone]      3 Days Post-Op Procedure(s) (LRB): RIGHT XI ROBOTIC ASSISTED RESECTION OF MEDIASTINAL MASS (Right) Subjective: Resting in bed, pain controlled. Chest tube is out. O2 at 1L/[place].  Objective: Vital signs in last 24 hours: Temp:  [97.6 F (36.4 C)-98.3 F (36.8 C)] 98.3 F (36.8 C) (09/18 0809) Pulse Rate:  [73-81] 80 (09/18 0809) Cardiac Rhythm: Normal sinus rhythm (09/18 0700) Resp:  [16-23] 23 (09/18 0809) BP: (133-147)/(58-77) 133/65 (09/18 0809) SpO2:  [93 %-97 %] 97 % (09/18 0809)    Intake/Output from previous day: 09/17 0701 - 09/18 0700 In: 480 [P.O.:480] Out: 1250 [Urine:1250] Intake/Output this shift: No intake/output data recorded.  General appearance: alert, cooperative, and no distress Heart: regular rate and rhythm Lungs: breath sounds clear, shallow. Has coarse, productive cough. CXR showing developing right basilar infiltrate concerning for pneumonia or aspiration.  Abdomen: soft, non tender Wound: Incisions dry.   Lab Results: Recent Labs    09/18/20 0500 09/19/20 0602  WBC 8.0 6.3  HGB 9.4* 9.8*  HCT 28.7* 29.5*  PLT 305 289    BMET:  Recent Labs    09/19/20 0602 09/20/20 0024  NA 128* 128*  K 3.3* 3.7  CL 90* 95*  CO2 28 26  GLUCOSE 102* 105*  BUN 17 16  CREATININE 1.38* 1.30*  CALCIUM 8.5* 8.3*     PT/INR:  No results for input(s): LABPROT, INR in the last 72 hours.  ABG    Component Value Date/Time   PHART 7.428 09/16/2020 0944   HCO3 27.6 09/16/2020 0944   TCO2 34 (H) 06/26/2020 0914   O2SAT 97.2 09/16/2020 0944   CBG (last 3)  Recent Labs    09/19/20 2333 09/20/20 0306 09/20/20 0910  GLUCAP 95 104* 117*     Meds Scheduled Meds:  acetaminophen  1,000 mg Oral Q6H   Or   acetaminophen (TYLENOL) oral liquid 160 mg/5 mL  1,000 mg Oral Q6H   amLODipine  10 mg Oral Daily   aspirin EC  81 mg Oral Daily   atenolol  50 mg Oral  BID   bisacodyl  10 mg Oral Daily   gabapentin  100 mg Oral QHS   hydrALAZINE  100 mg Oral TID   insulin aspart  0-24 Units Subcutaneous Q4H   methimazole  2.5 mg Oral Daily   pantoprazole  40 mg Oral QHS   senna-docusate  1 tablet Oral QHS   simvastatin  20 mg Oral QHS   Continuous Infusions: PRN Meds:.albuterol, diclofenac sodium, guaiFENesin, morphine injection, traMADol  Xrays EXAM: PORTABLE CHEST 1 VIEW   COMPARISON:  September 19, 2020   FINDINGS: Increasing infiltrate in the right base. The left chest tube and right central line have been removed. Mild atelectasis in the left mid lower lung identified. There may be a small left effusion. No pneumothorax. Stable cardiomegaly. The hila and mediastinum are unremarkable.   IMPRESSION: 1. Increasing infiltrate in the right base now favored to represent pneumonia or aspiration. Recommend clinical correlation and follow-up imaging to ensure resolution. 2. Interval removal of left chest tube without pneumothorax. 3. Probable atelectasis in the left mid lower lung with a possible small associated effusion. These findings are similar in the interval.     Electronically Signed   By: Dorise Bullion III M.D.   On:  09/20/2020 08:22  PATH:  SURGICAL PATHOLOGY Surgical pathology Collected: 09/17/20 1010  Lab status: Final-Edited  Resulting lab: [place] [facility]  Value: SURGICAL PATHOLOGY  CASE: [id number]  PATIENT: Mandy [name]  Surgical Pathology Report      Clinical History: mediastinal mass (cm)      FINAL MICROSCOPIC DIAGNOSIS:   A. MEDIASTINUM MASS, RESECTION:  - Benign thyroid tissue with nodular hyperplasia.   GROSS DESCRIPTION:   Received fresh is a 74 g, 7.8 x 6.3 by 2.8 cm well-defined soft to  rubbery mass, clinically mediastinal.  There is attached soft lobulated  fatty tissue (not included in weight).  The mass has a suture at 1  aspect, clinically anterior right pole.  The  specimen is inked, and cut  surfaces are diffusely tan-pink to pink-red, nodular/lobulated, with  focal pale yellow nodular induration. Found within fatty tissue adjacent  to area with attached suture is a 0.8 cm tan-red soft well-defined  nodule.  Block summary:  Blocks 1-5 = representative sections of mass, including inked external  surface  Block 6 = separate small nodule, bisected   SW 09/17/2020     Assessment/Plan: S/P Procedure(s) (LRB): RIGHT XI ROBOTIC ASSISTED RESECTION OF MEDIASTINAL MASS (Right)  -POD-3 robotic-assisted resection on benign mediastinal mass- path noted above.   -Coarse productive cough- possible right basilar infiltrate developing on CXR. She is not febrile, no leukocytosis. Needs to work on pulmonary hygiene. Flutter valve, IS, Mucinex.  No indication for ABX now. Monitor closely. Wean O2.   -History of HTN- controlled on her usual amlodipine, atenolol, and hydralazine.   -Hypokalemia-K+ up to 3.7 with supplement yesterday.   Mandy Odea PA-C Pager [phone] 09/20/2020  Patient seen and examined, agree with above Ambulate  Remo Lipps C. Roxan Hockey, MD [facility] [phone]

## 2020-09-20 NOTE — Discharge Summary (Signed)
Physician Discharge Summary  Patient ID: Mandy [name] MRN: [id number] DOB/AGE: 03-23-1940 80 y.o.  Admit date: 09/17/2020 Discharge date: 09/23/2020  Admission Diagnoses:  Mediastinal mass Type 2 diabetes mellitus Dyslipidemia Hypertension Obesity History of hyponatremia   Discharge Diagnoses:   Mediastinal mass Type 2 diabetes mellitus Dyslipidemia Hypertension Obesity History of hyponatremia  Status post robotic assisted resection of benign mediastinal mass   Discharged Condition: good  History of Present Illness:  Mandy [name] is a 80 yo female who was recently diagnosed with COVID 19 infection.  She also has a past medical history of DM, Hyperlipidemia, HTN, and Hyperthyroidism.  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  She was evaluated by Dr. Kipp Brood who recommended PET CT.  This showed the lesion to be mildly hypermetabolic.  It was felt she should undergo surgical resection of the mass.  The risks and benefits of the procedure were explained to the patient and she was agreeable to proceed.  Hospital Course:  Mandy [name] presented to [facility] on 09/17/2020.  She underwent Robotic Assisted Resection of Anterior Mediastinal Mass.  She tolerated the procedure without difficulty, was extubated, and taken to the PACU in stable condition.  Her arterial line was removed without difficulty.  On postoperative day #1 she was hemodynamically stable with some elevated blood pressure readings.  She was maintaining good oxygen saturations on 2 L of nasal cannula supplemental O2.  Her chest tube was noted to have 300 cc and we have kept in place.  He did have a acute elevation of her creatinine to 1.65 and she has had her Toradol and ARB discontinued for now and also started on some intravenous fluids.  She is noted to have an expected acute blood loss anemia, not at the transfusion threshold and is being  monitored.  She is starting a course of routine pulmonary hygiene as well as rehabilitation. 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 We are weaning her from her oxygen requirements. Today, she is tolerating room air, she is walking independently in the halls, her incisions are healing well, and she is ready for discharge home.     Consults: None  Significant Diagnostic Studies:   CLINICAL DATA:  Status post mediastinal mass removal.   EXAM: PORTABLE CHEST 1 VIEW   COMPARISON:  September 20, 2020.   FINDINGS: Stable cardiomegaly. No pneumothorax is noted. Stable right basilar opacity is noted concerning for pneumonia or atelectasis with associated effusion. Bony thorax is unremarkable. Minimal left basilar subsegmental atelectasis is noted.   IMPRESSION: Stable right basilar pneumonia or atelectasis with associated pleural effusion.   Aortic Atherosclerosis (ICD10-I70.0).     Electronically Signed   By: Marijo Conception M.D.   On: 09/21/2020 08:16  Treatments:   Surgery:  09/17/2020   Patient:  Mandy [name] Pre-Op Dx: Anterior mediastinal mass Post-op Dx: Same Procedure: - Robotic assisted right video thoracoscopy -Resection of anterior mediastinal mass   Surgeon and Role:      * Lightfoot, Lucile Crater, MD - Primary    *E. Barrett, PA-C- assisting   Anesthesia  general EBL: 50 ml Blood Administration: None Specimen: Mediastinal mass   Drains: 19 F chest tube in right chest Counts: correct     Indications: 80 year old female with a history of anterior mediastinal mass that was found incidentally when she was being evaluated and treated for COVID-19.  She underwent a  PET/CT which showed some avidity.  All of her tumor markers have been negative.  After much discussion with her and her family we agree that the best course of action will be to remove this mass.  She was agree to proceed that she presents today for surgery.   Findings: Large  anterior mediastinal mass.  There was no evidence of invasion of the pericardium or vascular structures the mediastinum.   Pathology: SURGICAL PATHOLOGY Surgical pathology Collected: 09/17/20 1010  Lab status: Final-Edited  Resulting lab: [place] [facility]  Value: SURGICAL PATHOLOGY  CASE: [id number]  PATIENT: Mandy [name]  Surgical Pathology Report   Clinical History: mediastinal mass (cm)    FINAL MICROSCOPIC DIAGNOSIS:   A. MEDIASTINUM MASS, RESECTION:  - Benign thyroid tissue with nodular hyperplasia.   GROSS DESCRIPTION:   Received fresh is a 80 g, 7.8 x 6.3 by 2.8 cm well-defined soft to  rubbery mass, clinically mediastinal.  There is attached soft lobulated  fatty tissue (not included in weight).  The mass has a suture at 1  aspect, clinically anterior right pole.  The specimen is inked, and cut  surfaces are diffusely tan-pink to pink-red, nodular/lobulated, with  focal pale yellow nodular induration. Found within fatty tissue adjacent  to area with attached suture is a 0.8 cm tan-red soft well-defined  nodule.  Block summary:  Blocks 1-5 = representative sections of mass, including inked external  surface  Block 6 = separate small nodule, bisected   SW 09/17/2020    Final Diagnosis performed by Vicente Males, MD.   Electronically signed  09/18/2020      Discharge Exam: Blood pressure (!) 157/75, pulse 86, temperature 98.3 F (36.8 C), temperature source Oral, resp. rate 17, height '4\' 11"'$  (1.499 m), weight 77.3 kg, SpO2 92 %.  General appearance: alert, cooperative, and no distress Heart: regular rate and rhythm, S1, S2 normal, no murmur, click, rub or gallop Lungs: clear to auscultation bilaterally Abdomen: soft, non-tender; bowel sounds normal; no masses,  no organomegaly Extremities: extremities normal, atraumatic, no cyanosis or edema Wound: clean and dry  Disposition: Discharge disposition: 01-Home or Self Care      Discharge  Instructions     Discharge patient   Complete by: As directed    Discharge disposition: 01-Home or Self Care   Discharge patient date: 09/22/2020      Allergies as of 09/22/2020   No Known Allergies      Medication List     TAKE these medications    albuterol 108 (90 Base) MCG/ACT inhaler Commonly known as: VENTOLIN HFA Inhale 2 puffs into the lungs every 6 (six) hours as needed for wheezing or shortness of breath.   alendronate 70 MG tablet Commonly known as: FOSAMAX Take 70 mg by mouth every Tuesday. Take with a full glass of water on an empty stomach.   amLODipine 10 MG tablet Commonly known as: NORVASC Take 10 mg by mouth daily.   aspirin EC 81 MG tablet Take 81 mg by mouth daily. Swallow whole.   atenolol 50 MG tablet Commonly known as: TENORMIN Take 50 mg by mouth 2 (two) times daily.   benazepril-hydrochlorthiazide 20-25 MG tablet Commonly known as: LOTENSIN HCT Take 1 tablet by mouth daily.   calcium carbonate 1500 (600 Ca) MG Tabs tablet Commonly known as: OSCAL Take 600 mg of elemental calcium by mouth 2 (two) times daily with a meal.   diclofenac sodium 1 % Gel Commonly known as: VOLTAREN Apply 4 g  topically 4 (four) times daily as needed for pain.   gabapentin 100 MG capsule Commonly known as: NEURONTIN Take 100 mg by mouth at bedtime.   guaiFENesin 600 MG 12 hr tablet Commonly known as: MUCINEX Take 600 mg by mouth 2 (two) times daily as needed for cough.   hydrALAZINE 100 MG tablet Commonly known as: APRESOLINE Take 100 mg by mouth 3 (three) times daily. What changed: Another medication with the same name was removed. Continue taking this medication, and follow the directions you see here.   metFORMIN 500 MG tablet Commonly known as: GLUCOPHAGE Take 500 mg by mouth 2 (two) times daily with a meal.   methimazole 5 MG tablet Commonly known as: TAPAZOLE Take 0.5 tablets (2.5 mg total) by mouth daily.   multivitamin with minerals Tabs  tablet Take 1 tablet by mouth daily.   pantoprazole 40 MG tablet Commonly known as: PROTONIX Take 1 tablet (40 mg total) by mouth daily at 6 (six) AM. What changed: when to take this   simvastatin 20 MG tablet Commonly known as: ZOCOR Take 1 tablet (20 mg total) by mouth at bedtime.   traMADol 50 MG tablet Commonly known as: ULTRAM Take 1 tablet (50 mg total) by mouth every 6 (six) hours as needed (mild pain).   vitamin B-12 1000 MCG tablet Commonly known as: CYANOCOBALAMIN Take 1,000 mcg by mouth daily.   vitamin C 500 MG tablet Commonly known as: ASCORBIC ACID Take 500 mg by mouth daily.   Vitamin D (Cholecalciferol) 25 MCG (1000 UT) Tabs Take 1,000 Units by mouth daily.   zinc gluconate 50 MG tablet Take 50 mg by mouth daily.        Follow-up Information     [name], MD. Go on 09/25/2020.   Specialty: Cardiothoracic Surgery Why: Your appointment with Dr. Kipp Brood is at 2:20 PM. Contact information: [place] [address] [phone]         [name], MD. Call today.   Specialty: Internal Medicine Contact information: [place] [address] [phone]         Care, [facility] Follow up.   Specialty: Home Health Services Why: [facility], HHPT,HHOT Contact information: [place] [address] [phone]                 Signed: John Giovanni PA-C 09/23/2020, 9:22 AM

## 2020-09-21 ENCOUNTER — Inpatient Hospital Stay (HOSPITAL_COMMUNITY): Payer: Medicare HMO

## 2020-09-21 LAB — GLUCOSE, CAPILLARY
Glucose-Capillary: 96 mg/dL (ref 70–99)
Glucose-Capillary: 111 mg/dL — ABNORMAL HIGH (ref 70–99)
Glucose-Capillary: 112 mg/dL — ABNORMAL HIGH (ref 70–99)
Glucose-Capillary: 151 mg/dL — ABNORMAL HIGH (ref 70–99)
Glucose-Capillary: 141 mg/dL — ABNORMAL HIGH (ref 70–99)

## 2020-09-21 MED ORDER — BENAZEPRIL-HYDROCHLOROTHIAZIDE 20-25 MG PO TABS: 1.0000 | ORAL_TABLET | Freq: Every day | ORAL | Status: DC

## 2020-09-21 MED ORDER — HYDROCHLOROTHIAZIDE 25 MG PO TABS
25.0000 mg | ORAL_TABLET | Freq: Every day | ORAL | Status: DC
  Administered 2020-09-21 – 2020-09-22 (×2): 25 mg via ORAL
  Filled 2020-09-21 (×2): qty 1

## 2020-09-21 MED ORDER — BENAZEPRIL HCL 20 MG PO TABS
20.0000 mg | ORAL_TABLET | Freq: Every day | ORAL | Status: DC
  Administered 2020-09-21 – 2020-09-22 (×2): 20 mg via ORAL
  Filled 2020-09-21 (×2): qty 1

## 2020-09-21 NOTE — TOC Transition Note (Signed)
Transition of Care [facility]) - CM/SW Discharge Note   Patient Details  Name: Valeen Borys MRN: [id number] Date of Birth: July 01, 1940  [facility] of [facility]) CM/SW Contact:  Zenon Mayo, RN Phone Number: 09/21/2020, 2:58 PM   Clinical Narrative:    NCM spoke with daughter at the bedside, offered choice for [facility] services with Medicare.gov list.  Daughter chose [place].  She states patient has a rolling walker at home and she has a container that she keeps her pills in.  NCM made referral to [facility] with [place] for [place], [place] , [place].  Awaiting to hear back to see if he can take referral.  NCM received call from [facility] with [facility] stating he can take referral.  Soc will begin 24 to 48 hrs post dc.      Final next level of care: [place] Barriers to Discharge: No Barriers Identified   Patient Goals and CMS Choice Patient states their goals for this hospitalization and ongoing recovery are:: return home CMS Medicare.gov Compare Post Acute Care list provided to:: Patient Represenative (must comment) Choice offered to / list presented to : Adult Children  Discharge Placement                       Discharge Plan and Services                  DME Agency: NA       HH Arranged: RN, PT, OT HH Agency: [place] Date [facility] Agency Contacted: 09/21/20 Time [place]uma: 0045 Representative spoke with at [place]: [place] ([place]) Interventions     Readmission Risk Interventions No flowsheet data found.

## 2020-09-21 NOTE — Progress Notes (Signed)
[address]       ,[place] [address]             [phone]      4 Days Post-Op Procedure(s) (LRB): RIGHT XI ROBOTIC ASSISTED RESECTION OF MEDIASTINAL MASS (Right) Subjective: Did not walk yesterday in the halls but plans to walk today.   Objective: Vital signs in last 24 hours: Temp:  [98.1 F (36.7 C)-98.3 F (36.8 C)] 98.3 F (36.8 C) (09/19 0345) Pulse Rate:  [79-82] 80 (09/19 0345) Cardiac Rhythm: Normal sinus rhythm (09/18 1900) Resp:  [20-23] 23 (09/19 0345) BP: (133-175)/(65-73) 175/73 (09/19 0345) SpO2:  [90 %-97 %] 95 % (09/19 0345)     Intake/Output from previous day: 09/18 0701 - 09/19 0700 In: -  Out: 2000 [Urine:2000] Intake/Output this shift: No intake/output data recorded.  General appearance: alert, cooperative, and no distress Heart: regular rate and rhythm, S1, S2 normal, no murmur, click, rub or gallop Lungs: clear to auscultation bilaterally Abdomen: soft, non-tender; bowel sounds normal; no masses,  no organomegaly Extremities: extremities normal, atraumatic, no cyanosis or edema Wound: clean and dry  Lab Results: Recent Labs    09/19/20 0602  WBC 6.3  HGB 9.8*  HCT 29.5*  PLT 289   BMET:  Recent Labs    09/19/20 0602 09/20/20 0024  NA 128* 128*  K 3.3* 3.7  CL 90* 95*  CO2 28 26  GLUCOSE 102* 105*  BUN 17 16  CREATININE 1.38* 1.30*  CALCIUM 8.5* 8.3*    PT/INR: No results for input(s): LABPROT, INR in the last 72 hours. ABG    Component Value Date/Time   PHART 7.428 09/16/2020 0944   HCO3 27.6 09/16/2020 0944   TCO2 34 (H) 06/26/2020 0914   O2SAT 97.2 09/16/2020 0944   CBG (last 3)  Recent Labs    09/20/20 1930 09/20/20 2303 09/21/20 0344  GLUCAP 148* 103* 96   PATH:   SURGICAL PATHOLOGY Surgical pathology Collected: 09/17/20 1010  Lab status: Final-Edited  Resulting lab: [place] [facility]  Value: SURGICAL PATHOLOGY  CASE: [id number]  PATIENT: Mandy [name]  Surgical  Pathology Report      Clinical History: mediastinal mass (cm)      FINAL MICROSCOPIC DIAGNOSIS:   A. MEDIASTINUM MASS, RESECTION:  - Benign thyroid tissue with nodular hyperplasia.   GROSS DESCRIPTION:   Received fresh is a 74 g, 7.8 x 6.3 by 2.8 cm well-defined soft to  rubbery mass, clinically mediastinal.  There is attached soft lobulated  fatty tissue (not included in weight).  The mass has a suture at 1  aspect, clinically anterior right pole.  The specimen is inked, and cut  surfaces are diffusely tan-pink to pink-red, nodular/lobulated, with  focal pale yellow nodular induration. Found within fatty tissue adjacent  to area with attached suture is a 0.8 cm tan-red soft well-defined  nodule.  Block summary:  Blocks 1-5 = representative sections of mass, including inked external  surface  Block 6 = separate small nodule, bisected   SW 09/17/2020     Assessment/Plan: S/P Procedure(s) (LRB): RIGHT XI ROBOTIC ASSISTED RESECTION OF MEDIASTINAL MASS (Right)  -POD-4 robotic-assisted resection on benign mediastinal mass- path noted above.   -HTN She has all her home medications added   -CXR shows possible right basilar infiltrate developing. She is not febrile, no leukocytosis. Needs to work on pulmonary hygiene. Flutter valve, IS, Mucinex. Monitor closely. Wean O2.    -History of HTN- controlled  on her usual amlodipine, atenolol, and hydralazine.    -Hypokalemia-K+ up to 3.7 with supplement yesterday.  -Creatinine 1.30, stable  Plan: Ambulate around the unit, weaning oxygen as tolerated. Labs for tomorrow. Will re-evaluate HTN after morning meds are given.    LOS: 4 days    Elgie Collard 09/21/2020

## 2020-09-21 NOTE — TOC Progression Note (Addendum)
Transition of Care [facility]) - Progression Note    Patient Details  Name: Mandy [name] MRN: [id number] Date of Birth: 11/01/1940  Transition of Care [facility]) CM/SW Contact  Zenon Mayo, RN Phone Number: 09/21/2020, 2:37 PM  Clinical Narrative:    NCM spoke with daughter at the bedside, offered choice for [place] [facility] services with Medicare.gov list.  Daughter chose [place].  She states patient has a rolling walker at home and she has a container that she keeps her pills in.  NCM made referral to [facility] with [place] for [place], [place] , [place].  Awaiting to hear back to see if he can take referral.  NCM received call from [facility] with [facility] stating he can take referral.  Soc will begin 24 to 48 hrs post dc.         Expected Discharge Plan and Services                                                 Social Determinants of Health (SDOH) Interventions    Readmission Risk Interventions No flowsheet data found.

## 2020-09-21 NOTE — Care Management Important Message (Signed)
Important Message  Patient Details  Name: Mandy [name] MRN: [id number] Date of Birth: [birth year]   Medicare Important Message Given:  Yes     Memory Argue 09/21/2020, 12:28 PM

## 2020-09-21 NOTE — Evaluation (Signed)
Physical Therapy Evaluation Patient Details Name: Mandy [name] MRN: [id number] DOB: 26-Apr-1940 Today's Date: 09/21/2020  History of Present Illness  Patient is a 80 y/o female from [place] with h/o HTN, CHF, GERD, DM, hyperthyroidism, and pneumonia/COVID admitted for mediastial mass now s/p robotic assisted R VATS and resection of mass.  Clinical Impression  Patient presents with decreased mobility due to weakness post surgery and bedrest.  Normally she walks with a walker at home independent.  She has family assist prn and she will benefit from skilled PT in the acute setting and from follow up [place] at d/c.        Recommendations for follow up therapy are one component of a multi-disciplinary discharge planning process, led by the attending physician.  Recommendations may be updated based on patient status, additional functional criteria and insurance authorization.  Follow Up Recommendations Home health PT;Supervision for mobility/OOB    Equipment Recommendations  3in1 (PT)    Recommendations for Other Services       Precautions / Restrictions Precautions Precautions: Fall Precaution Comments: watch SpO2      Mobility  Bed Mobility Overal bed mobility: Needs Assistance Bed Mobility: Sit to Supine       Sit to supine: Mod assist   General bed mobility comments: assist for putting legs into bed, pt assisted to scoot to middle and up in bed    Transfers Overall transfer level: Needs assistance Equipment used: Rolling walker (2 wheeled) Transfers: Sit to/from Stand Sit to Stand: Min guard         General transfer comment: from EOB assist for balance  Ambulation/Gait Ambulation/Gait assistance: Min guard Gait Distance (Feet): 170 Feet Assistive device: Rolling walker (2 wheeled) Gait Pattern/deviations: Step-through pattern;Decreased stride length;Trunk flexed;Shuffle;Wide base of support     General Gait Details: cues for walker proximity, keeps further  out in front  Stairs            Wheelchair Mobility    Modified Rankin (Stroke Patients Only)       Balance Overall balance assessment: Needs assistance   Sitting balance-Leahy Scale: Fair       Standing balance-Leahy Scale: Poor Standing balance comment: UE support or external assist for balance                             Pertinent Vitals/Pain Pain Assessment: Faces Faces Pain Scale: Hurts little more Pain Location: R side at incision and L shoulder (chronic) Pain Descriptors / Indicators: Guarding;Aching Pain Intervention(s): Monitored during session;Repositioned    Home Living Family/patient expects to be discharged to:: Private residence Living Arrangements: Children Available Help at Discharge: Family Type of Home: House Home Access: Level entry     Home Layout: One level Home Equipment: Environmental consultant - 2 wheels Additional Comments: daughter in the room and interpreting for pt    Prior Function Level of Independence: Independent with assistive device(s)               Hand Dominance   Dominant Hand: Right    Extremity/Trunk Assessment   Upper Extremity Assessment Upper Extremity Assessment: RUE deficits/detail;LUE deficits/detail RUE Deficits / Details: AROM WFL, strength at least 3/5, lifting repetatively for breathing exercise LUE Deficits / Details: h/o pain in shoulder only lifts to about 95, grip WFL    Lower Extremity Assessment Lower Extremity Assessment: Generalized weakness    Cervical / Trunk Assessment Cervical / Trunk Assessment: Kyphotic  Communication   Communication:  Prefers language other than [place];Interpreter utilized  Cognition Arousal/Alertness: Awake/alert Behavior During Therapy: WFL for tasks assessed/performed Overall Cognitive Status: Difficult to assess                                 General Comments: seems WNL and daughter in room and feels baseline      General Comments General  comments (skin integrity, edema, etc.): Nursing reports hasn't walked in hallway since surgery; daughter present throughout session.  SpO2 dropped to 86% on RA with ambulation, but rebounded to 89-90% without O2, RN aware.    Exercises Other Exercises Other Exercises: seated R arm elevation x 5 with breathing exercise   Assessment/Plan    PT Assessment Patient needs continued PT services  PT Problem List Decreased strength;Decreased mobility;Decreased activity tolerance;Decreased balance;Decreased knowledge of use of DME;Cardiopulmonary status limiting activity;Decreased safety awareness       PT Treatment Interventions DME instruction;Therapeutic activities;Patient/family education;Therapeutic exercise;Gait training;Balance training;Functional mobility training    PT Goals (Current goals can be found in the Care Plan section)  Acute Rehab PT Goals Patient Stated Goal: to go home PT Goal Formulation: With patient/family Time For Goal Achievement: 10/05/20 Potential to Achieve Goals: Good    Frequency Min 3X/week   Barriers to discharge        Co-evaluation               AM-PAC PT "6 Clicks" Mobility  Outcome Measure Help needed turning from your back to your side while in a flat bed without using bedrails?: A Little Help needed moving from lying on your back to sitting on the side of a flat bed without using bedrails?: A Lot Help needed moving to and from a bed to a chair (including a wheelchair)?: A Little Help needed standing up from a chair using your arms (e.g., wheelchair or bedside chair)?: A Little Help needed to walk in hospital room?: A Little Help needed climbing 3-5 steps with a railing? : A Lot 6 Click Score: 16    End of Session   Activity Tolerance: Patient limited by fatigue Patient left: in bed;with call bell/phone within reach;with family/visitor present;with nursing/sitter in room   PT Visit Diagnosis: Other abnormalities of gait and mobility  (R26.89);Muscle weakness (generalized) (M62.81);Difficulty in walking, not elsewhere classified (R26.2)    Time: 1450-1516 PT Time Calculation (min) (ACUTE ONLY): 26 min   Charges:   PT Evaluation $PT Eval Moderate Complexity: 1 Mod PT Treatments $Gait Training: 8-22 mins        Magda Kiel, PT Acute Rehabilitation Services [facility]:[phone] [phone] 09/21/2020   Reginia Naas 09/21/2020, 3:45 PM

## 2020-09-22 LAB — GLUCOSE, CAPILLARY
Glucose-Capillary: 107 mg/dL — ABNORMAL HIGH (ref 70–99)
Glucose-Capillary: 108 mg/dL — ABNORMAL HIGH (ref 70–99)
Glucose-Capillary: 93 mg/dL (ref 70–99)

## 2020-09-22 LAB — CBC
HCT: 28.9 % — ABNORMAL LOW (ref 36.0–46.0)
Hemoglobin: 9.6 g/dL — ABNORMAL LOW (ref 12.0–15.0)
MCH: 26.6 pg (ref 26.0–34.0)
MCHC: 33.2 g/dL (ref 30.0–36.0)
MCV: 80.1 fL (ref 80.0–100.0)
Platelets: 327 10*3/uL (ref 150–400)
RBC: 3.61 MIL/uL — ABNORMAL LOW (ref 3.87–5.11)
RDW: 15 % (ref 11.5–15.5)
WBC: 6.5 10*3/uL (ref 4.0–10.5)
nRBC: 0 % (ref 0.0–0.2)

## 2020-09-22 LAB — BASIC METABOLIC PANEL
Anion gap: 8 (ref 5–15)
BUN: 12 mg/dL (ref 8–23)
CO2: 29 mmol/L (ref 22–32)
Calcium: 8.4 mg/dL — ABNORMAL LOW (ref 8.9–10.3)
Chloride: 93 mmol/L — ABNORMAL LOW (ref 98–111)
Creatinine, Ser: 0.93 mg/dL (ref 0.44–1.00)
GFR, Estimated: >60 mL/min (ref >60)
Glucose, Bld: 97 mg/dL (ref 70–99)
Potassium: 3.5 mmol/L (ref 3.5–5.1)
Sodium: 130 mmol/L — ABNORMAL LOW (ref 135–145)

## 2020-09-22 MED ORDER — TRAMADOL HCL 50 MG PO TABS: 50.0000 mg | ORAL_TABLET | Freq: Four times a day (QID) | ORAL | 0 refills | Status: DC | PRN

## 2020-09-22 NOTE — Plan of Care (Signed)
  Problem: Education: Goal: Knowledge of General Education information will improve Description: Including pain rating scale, medication(s)/side effects and non-pharmacologic comfort measures Outcome: Progressing   Problem: Health Behavior/Discharge Planning: Goal: Ability to manage health-related needs will improve Outcome: Progressing   Problem: Clinical Measurements: Goal: Ability to maintain clinical measurements within normal limits will improve Outcome: Progressing Goal: Will remain free from infection Outcome: Progressing Goal: Diagnostic test results will improve Outcome: Progressing Goal: Respiratory complications will improve Outcome: Progressing Goal: Cardiovascular complication will be avoided Outcome: Progressing   Problem: Activity: Goal: Risk for activity intolerance will decrease Outcome: Progressing   Problem: Nutrition: Goal: Adequate nutrition will be maintained Outcome: Progressing   Problem: Coping: Goal: Level of anxiety will decrease Outcome: Progressing   Problem: Elimination: Goal: Will not experience complications related to bowel motility Outcome: Progressing Goal: Will not experience complications related to urinary retention Outcome: Progressing   Problem: Pain Managment: Goal: General experience of comfort will improve Outcome: Progressing   Problem: Safety: Goal: Ability to remain free from injury will improve Outcome: Progressing   Problem: Skin Integrity: Goal: Risk for impaired skin integrity will decrease Outcome: Progressing   Problem: Education: Goal: Knowledge of disease or condition will improve Outcome: Progressing Goal: Knowledge of the prescribed therapeutic regimen will improve Outcome: Progressing   Problem: Activity: Goal: Risk for activity intolerance will decrease Outcome: Progressing   Problem: Cardiac: Goal: Will achieve and/or maintain hemodynamic stability Outcome: Progressing   Problem: Clinical  Measurements: Goal: Postoperative complications will be avoided or minimized Outcome: Progressing   Problem: Respiratory: Goal: Respiratory status will improve Outcome: Progressing

## 2020-09-22 NOTE — Progress Notes (Signed)
Patient walked for 41mins on room air around unit using front wheel walker. Patients O2 remained between 88%-92% on room air. Patient stopped a few times to rest during ambulation stating that she feels "weak". Patient wanted to get back in bed upon returning to room.

## 2020-09-22 NOTE — Progress Notes (Signed)
      [address]       [place] [address]             [phone]      5 Days Post-Op Procedure(s) (LRB): RIGHT XI ROBOTIC ASSISTED RESECTION OF MEDIASTINAL MASS (Right) Subjective: Feels good this morning, no complaints. She did walk a good amount yesterday.   Objective: Vital signs in last 24 hours: Temp:  [98.1 F (36.7 C)-98.7 F (37.1 C)] 98.1 F (36.7 C) (09/20 0403) Pulse Rate:  [73-88] 75 (09/20 0403) Cardiac Rhythm: Normal sinus rhythm (09/20 0350) Resp:  [15-22] 18 (09/20 0403) BP: (138-158)/(57-70) 158/70 (09/20 0403) SpO2:  [90 %-99 %] 96 % (09/20 0403)     Intake/Output from previous day: 09/19 0701 - 09/20 0700 In: 320 [P.O.:320] Out: 1050 [Urine:1050] Intake/Output this shift: No intake/output data recorded.  General appearance: alert, cooperative, and no distress Heart: regular rate and rhythm, S1, S2 normal, no murmur, click, rub or gallop Lungs: clear to auscultation bilaterally Abdomen: soft, non-tender; bowel sounds normal; no masses,  no organomegaly Extremities: extremities normal, atraumatic, no cyanosis or edema Wound: clean and dry  Lab Results: Recent Labs    09/22/20 0043  WBC 6.5  HGB 9.6*  HCT 28.9*  PLT 327   BMET:  Recent Labs    09/20/20 0024 09/22/20 0043  NA 128* 130*  K 3.7 3.5  CL 95* 93*  CO2 26 29  GLUCOSE 105* 97  BUN 16 12  CREATININE 1.30* 0.93  CALCIUM 8.3* 8.4*    PT/INR: No results for input(s): LABPROT, INR in the last 72 hours. ABG    Component Value Date/Time   PHART 7.428 09/16/2020 0944   HCO3 27.6 09/16/2020 0944   TCO2 34 (H) 06/26/2020 0914   O2SAT 97.2 09/16/2020 0944   CBG (last 3)  Recent Labs    09/21/20 1605 09/21/20 1944 09/22/20 0008  GLUCAP 151* 141* 93    Assessment/Plan: S/P Procedure(s) (LRB): RIGHT XI ROBOTIC ASSISTED RESECTION OF MEDIASTINAL MASS (Right)  -POD-5 robotic-assisted resection on benign mediastinal mass- path noted above.    -HTN She  has all her home medications added   -CXR shows possible right basilar infiltrate developing. She is not febrile, no leukocytosis. Needs to work on pulmonary hygiene. Flutter valve, IS, Mucinex. Monitor closely. Now tolerating room air with good oxygen saturation.    -History of HTN- controlled on her usual amlodipine, Lotensin HCT, atenolol, and hydralazine.    -Hypokalemia-K+ up to 3.5, creatinine 0.93, much improved.    -H and H stable 9.6/28.9  Plan: Home with home health services. We are doing a 6 minute walk test to evaluate her need for home oxygen. I do not think she should need it. Continue use of incentive spirometer, she is getting up to 1250.    LOS: 5 days    Elgie Collard 09/22/2020

## 2020-09-23 LAB — GLUCOSE, CAPILLARY
Glucose-Capillary: 152 mg/dL — ABNORMAL HIGH (ref 70–99)
Glucose-Capillary: 166 mg/dL — ABNORMAL HIGH (ref 70–99)

## 2020-09-24 ENCOUNTER — Inpatient Hospital Stay (HOSPITAL_COMMUNITY)
Admission: EM | Admit: 2020-09-24 | Discharge: 2020-09-26 | DRG: 291 | Disposition: A | Discharge status: Home Health | Payer: Medicare Other | Attending: Internal Medicine | Admitting: Internal Medicine

## 2020-09-24 ENCOUNTER — Telehealth: Payer: Self-pay

## 2020-09-24 ENCOUNTER — Other Ambulatory Visit: Payer: Self-pay

## 2020-09-24 ENCOUNTER — Ambulatory Visit: Payer: Medicare HMO

## 2020-09-24 ENCOUNTER — Emergency Department (HOSPITAL_COMMUNITY): Payer: Medicare Other

## 2020-09-24 ENCOUNTER — Encounter (HOSPITAL_COMMUNITY): Payer: Self-pay | Admitting: Emergency Medicine

## 2020-09-24 DIAGNOSIS — R42 Dizziness and giddiness: Secondary | ICD-10-CM | POA: Diagnosis present

## 2020-09-24 DIAGNOSIS — Z7983 Long term (current) use of bisphosphonates: Secondary | ICD-10-CM

## 2020-09-24 DIAGNOSIS — Z7984 Long term (current) use of oral hypoglycemic drugs: Secondary | ICD-10-CM | POA: Diagnosis not present

## 2020-09-24 DIAGNOSIS — E039 Hypothyroidism, unspecified: Secondary | ICD-10-CM | POA: Diagnosis not present

## 2020-09-24 DIAGNOSIS — M810 Age-related osteoporosis without current pathological fracture: Secondary | ICD-10-CM | POA: Diagnosis not present

## 2020-09-24 DIAGNOSIS — K219 Gastro-esophageal reflux disease without esophagitis: Secondary | ICD-10-CM | POA: Diagnosis not present

## 2020-09-24 DIAGNOSIS — R0602 Shortness of breath: Secondary | ICD-10-CM | POA: Diagnosis present

## 2020-09-24 DIAGNOSIS — E785 Hyperlipidemia, unspecified: Secondary | ICD-10-CM | POA: Diagnosis present

## 2020-09-24 DIAGNOSIS — Z7982 Long term (current) use of aspirin: Secondary | ICD-10-CM | POA: Diagnosis not present

## 2020-09-24 DIAGNOSIS — Z20822 Contact with and (suspected) exposure to covid-19: Secondary | ICD-10-CM | POA: Diagnosis not present

## 2020-09-24 DIAGNOSIS — R0902 Hypoxemia: Secondary | ICD-10-CM

## 2020-09-24 DIAGNOSIS — E871 Hypo-osmolality and hyponatremia: Secondary | ICD-10-CM | POA: Diagnosis present

## 2020-09-24 DIAGNOSIS — I6782 Cerebral ischemia: Secondary | ICD-10-CM | POA: Diagnosis not present

## 2020-09-24 DIAGNOSIS — Z79899 Other long term (current) drug therapy: Secondary | ICD-10-CM | POA: Diagnosis not present

## 2020-09-24 DIAGNOSIS — J9811 Atelectasis: Secondary | ICD-10-CM | POA: Diagnosis not present

## 2020-09-24 DIAGNOSIS — I5032 Chronic diastolic (congestive) heart failure: Secondary | ICD-10-CM | POA: Diagnosis present

## 2020-09-24 DIAGNOSIS — N179 Acute kidney failure, unspecified: Secondary | ICD-10-CM | POA: Diagnosis present

## 2020-09-24 DIAGNOSIS — M1990 Unspecified osteoarthritis, unspecified site: Secondary | ICD-10-CM | POA: Diagnosis present

## 2020-09-24 DIAGNOSIS — E0590 Thyrotoxicosis, unspecified without thyrotoxic crisis or storm: Secondary | ICD-10-CM | POA: Diagnosis not present

## 2020-09-24 DIAGNOSIS — J9601 Acute respiratory failure with hypoxia: Secondary | ICD-10-CM | POA: Diagnosis present

## 2020-09-24 DIAGNOSIS — I5033 Acute on chronic diastolic (congestive) heart failure: Secondary | ICD-10-CM | POA: Diagnosis not present

## 2020-09-24 DIAGNOSIS — Z8616 Personal history of COVID-19: Secondary | ICD-10-CM

## 2020-09-24 DIAGNOSIS — I700 Atherosclerosis of aorta: Secondary | ICD-10-CM | POA: Diagnosis present

## 2020-09-24 DIAGNOSIS — K760 Fatty (change of) liver, not elsewhere classified: Secondary | ICD-10-CM | POA: Diagnosis present

## 2020-09-24 DIAGNOSIS — I110 Hypertensive heart disease with heart failure: Secondary | ICD-10-CM | POA: Diagnosis not present

## 2020-09-24 DIAGNOSIS — E118 Type 2 diabetes mellitus with unspecified complications: Secondary | ICD-10-CM

## 2020-09-24 DIAGNOSIS — E119 Type 2 diabetes mellitus without complications: Secondary | ICD-10-CM | POA: Diagnosis not present

## 2020-09-24 LAB — TROPONIN I (HIGH SENSITIVITY)
Troponin I (High Sensitivity): 35 ng/L — ABNORMAL HIGH (ref <18)
Troponin I (High Sensitivity): 36 ng/L — ABNORMAL HIGH (ref <18)
Troponin I (High Sensitivity): 36 ng/L — ABNORMAL HIGH (ref <18)
Troponin I (High Sensitivity): 36 ng/L — ABNORMAL HIGH (ref <18)

## 2020-09-24 LAB — CBC WITH DIFFERENTIAL/PLATELET
Abs Immature Granulocytes: 0.04 10*3/uL (ref 0.00–0.07)
Basophils Absolute: 0 10*3/uL (ref 0.0–0.1)
Basophils Relative: 1 %
Eosinophils Absolute: 0.1 10*3/uL (ref 0.0–0.5)
Eosinophils Relative: 2 %
HCT: 30.9 % — ABNORMAL LOW (ref 36.0–46.0)
Hemoglobin: 10.3 g/dL — ABNORMAL LOW (ref 12.0–15.0)
Immature Granulocytes: 1 %
Lymphocytes Relative: 22 %
Lymphs Abs: 1.3 10*3/uL (ref 0.7–4.0)
MCH: 26.5 pg (ref 26.0–34.0)
MCHC: 33.3 g/dL (ref 30.0–36.0)
MCV: 79.4 fL — ABNORMAL LOW (ref 80.0–100.0)
Monocytes Absolute: 0.7 10*3/uL (ref 0.1–1.0)
Monocytes Relative: 11 %
Neutro Abs: 4.1 10*3/uL (ref 1.7–7.7)
Neutrophils Relative %: 63 %
Platelets: 399 10*3/uL (ref 150–400)
RBC: 3.89 MIL/uL (ref 3.87–5.11)
RDW: 14.7 % (ref 11.5–15.5)
WBC: 6.2 10*3/uL (ref 4.0–10.5)
nRBC: 0 % (ref 0.0–0.2)

## 2020-09-24 LAB — COMPREHENSIVE METABOLIC PANEL
ALT: 7 U/L (ref 0–44)
AST: 16 U/L (ref 15–41)
Albumin: 3.1 g/dL — ABNORMAL LOW (ref 3.5–5.0)
Alkaline Phosphatase: 33 U/L — ABNORMAL LOW (ref 38–126)
Anion gap: 11 (ref 5–15)
BUN: 14 mg/dL (ref 8–23)
CO2: 27 mmol/L (ref 22–32)
Calcium: 8.8 mg/dL — ABNORMAL LOW (ref 8.9–10.3)
Chloride: 87 mmol/L — ABNORMAL LOW (ref 98–111)
Creatinine, Ser: 1.12 mg/dL — ABNORMAL HIGH (ref 0.44–1.00)
GFR, Estimated: 50 mL/min — ABNORMAL LOW (ref >60)
Glucose, Bld: 161 mg/dL — ABNORMAL HIGH (ref 70–99)
Potassium: 3.3 mmol/L — ABNORMAL LOW (ref 3.5–5.1)
Sodium: 125 mmol/L — ABNORMAL LOW (ref 135–145)
Total Bilirubin: 0.4 mg/dL (ref 0.3–1.2)
Total Protein: 6.1 g/dL — ABNORMAL LOW (ref 6.5–8.1)

## 2020-09-24 LAB — URINALYSIS, ROUTINE W REFLEX MICROSCOPIC
Bilirubin Urine: NEGATIVE
Glucose, UA: NEGATIVE mg/dL
Hgb urine dipstick: NEGATIVE
Ketones, ur: NEGATIVE mg/dL
Nitrite: NEGATIVE
Protein, ur: NEGATIVE mg/dL
Specific Gravity, Urine: 1.016 (ref 1.005–1.030)
pH: 5 (ref 5.0–8.0)

## 2020-09-24 LAB — MAGNESIUM: Magnesium: 1.7 mg/dL (ref 1.7–2.4)

## 2020-09-24 LAB — RESP PANEL BY RT-PCR (FLU A&B, COVID) ARPGX2
Influenza A by PCR: NEGATIVE
Influenza B by PCR: NEGATIVE
SARS Coronavirus 2 by RT PCR: NEGATIVE

## 2020-09-24 MED ORDER — POTASSIUM CHLORIDE CRYS ER 20 MEQ PO TBCR
20.0000 meq | EXTENDED_RELEASE_TABLET | Freq: Once | ORAL | Status: AC
  Administered 2020-09-24: 20 meq via ORAL
  Filled 2020-09-24: qty 1

## 2020-09-24 MED ORDER — IOHEXOL 350 MG/ML SOLN
75.0000 mL | Freq: Once | INTRAVENOUS | Status: AC | PRN
  Administered 2020-09-24: 75 mL via INTRAVENOUS

## 2020-09-24 MED ORDER — LORAZEPAM 1 MG PO TABS
0.5000 mg | ORAL_TABLET | Freq: Once | ORAL | Status: AC
  Administered 2020-09-24: 0.5 mg via SUBLINGUAL
  Filled 2020-09-24: qty 1

## 2020-09-24 MED ORDER — SODIUM CHLORIDE 0.9 % IV BOLUS
1000.0000 mL | Freq: Once | INTRAVENOUS | Status: AC
  Administered 2020-09-24: 1000 mL via INTRAVENOUS

## 2020-09-24 NOTE — Telephone Encounter (Signed)
Home health nurse/ [place] with [facility] called stating that she saw Mandy [name] today and she was c/o nausea with dry heaves, increased weakness, dizziness and Shortness of breath. Her oxygen levels dropped to 80% with ambulation. She was requesting a CXR, labs and home O2, after speaking with Dr Kipp Brood he requested that the patient return to the ED for eval.  Patient's daughter was notified and will take her to the ED.

## 2020-09-24 NOTE — ED Provider Notes (Signed)
  Provider Note MRN:  [id number]  Arrival date & time: 09/25/20    ED Course and Medical Decision Making  Assumed care from Dr. Armandina Gemma at shift change.  Near syncope, elevated troponin, electrolyte disturbance, recent admission for surgery, anticipating admission after CTPA.  CTA is without PE.  Patient with saturations of 86% on room air, requiring 2 L.  Unclear etiology, considering some pulmonary edema or fluid overloaded state postoperatively.  Case discussed with Dr. Caffie Pinto of cardiothoracic surgery, plan is to admit to medicine for further care.    Procedures  Final Clinical Impressions(s) / ED Diagnoses     ICD-10-CM   1. Hypoxia  R09.02       ED Discharge Orders     None       Discharge Instructions   None     Barth Kirks. Sedonia Small, [place] [email or web address]    Maudie Flakes, MD 09/25/20 [phone]

## 2020-09-24 NOTE — ED Triage Notes (Signed)
Pt coming from home compliant of dizziness, n/v. Pt home health nurse sent pt to be seen.

## 2020-09-24 NOTE — ED Provider Notes (Signed)
Emergency Medicine Provider Triage Evaluation Note  Mandy [name] , a 80 y.o. female  was evaluated in triage.  Pt complains of dizziness.  Patient states that she has been dizzy for a while now, however symptoms recently worsened this morning.  States she was recently admitted for similar symptoms which got better on their own, says they were unable to find a source of symptoms.  Review of Systems  Positive: Dizziness Negative: Fevers, chills, chest Pain, shortness of breath  Physical Exam  BP (!) 157/58 (BP Location: Right Arm)   Pulse 78   Temp 98.8 F (37.1 C) (Oral)   Resp 20   SpO2 93%  Gen:   Awake, no distress  Resp:  Normal effort MSK:   Moves extremities without difficulty  Other:  Neurologically intact on basic neuro exam  Medical Decision Making  Medically screening exam initiated at 3:36 PM.  Appropriate orders placed.  Mandy [name] was informed that the remainder of the evaluation will be completed by another provider, this initial triage assessment does not replace that evaluation, and the importance of remaining in the ED until their evaluation is complete.     Mandy [name] 09/24/20 1546    Mandy Rank, MD 09/27/20 [phone]

## 2020-09-24 NOTE — ED Notes (Signed)
Patient transported to CT 

## 2020-09-24 NOTE — ED Notes (Signed)
Patients pulse ox at 91% while ambulating.

## 2020-09-24 NOTE — ED Provider Notes (Signed)
[facility] EMERGENCY DEPARTMENT Provider Note   CSN: [id number] Arrival date & time: 09/24/20  1456     History Chief Complaint  Patient presents with   Dizziness   Nausea   Emesis    Mandy [name] is a 80 y.o. female.    80 year old female with a history of DM2, GERD, HLD, HTN, small bowel obstruction, recent robotic resection of a mediastinal mass within the past week, discharge home to recover presenting to the emergency department with an episode of lightheadedness, shortness of breath.  The patient endorses left-sided chest discomfort that is pleuritic.  She endorses some associated nausea.  She was diagnosed with COVID-19 roughly 3 months ago.  She was admitted to the hospital 09/17/2020 and underwent robotic assisted resection of an anterior mediastinal mass.  She endorses generalized fatigue and weakness.  Her chest pain is sharp and nonradiating and associated with shortness of breath.    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   Acute respiratory failure with hypoxia ([place]) 06/26/2020   Hypertensive urgency 06/26/2020   Hypokalemia 06/26/2020   Generalized weakness 06/26/2020   Dizziness 06/26/2020   Obesity (BMI 30-39.9) 06/26/2020   Hyperthyroidism 06/26/2020   Chronic diastolic CHF (congestive heart failure) ([place]) 06/26/2020   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   Prolonged QT interval 01/31/2019   Hyponatremia 40/98/1191   Acute  metabolic encephalopathy 47/82/9562   COVID-19 virus infection 01/31/2019   Mass of right ovary 10/08/2018   Hepatic steatosis 10/08/2018   Aortic atherosclerosis ([place]) 10/08/2018   SBO (small bowel obstruction) ([place]) 08/09/2018   Diabetes mellitus type 2, controlled ([place]) 04/07/2008   Dyslipidemia 04/07/2008   Essential hypertension 04/07/2008   SORE THROAT 04/07/2008   GERD 04/07/2008    Past Surgical History:  Procedure Laterality Date   CATARACT EXTRACTION W/ INTRAOCULAR LENS  IMPLANT, BILATERAL     COLONOSCOPY       OB History   No obstetric history on file.     Family History  Problem Relation Age of Onset   Kidney disease Son    Colon cancer Neg Hx    Breast cancer Neg Hx     Social History   Tobacco Use   Smoking status: Never   Smokeless tobacco: Never  Vaping Use   Vaping Use: Never used  Substance Use Topics   Alcohol use: No   Drug use: No    Home Medications Prior to Admission medications   Medication Sig Start Date End Date Taking? Authorizing Provider  albuterol (VENTOLIN HFA) 108 (90 Base) MCG/ACT inhaler Inhale 2 puffs into the lungs every 6 (six) hours as needed for wheezing or shortness of breath. 06/30/20   Thurnell Lose, MD  alendronate (FOSAMAX) 70 MG tablet Take 70 mg by mouth every Tuesday. Take with a full glass of water on an empty stomach.    [provider]  amLODipine (NORVASC) 10 MG tablet Take 10 mg by  mouth daily.    [provider]  aspirin EC 81 MG tablet Take 81 mg by mouth daily. Swallow whole.    [provider]  atenolol (TENORMIN) 50 MG tablet Take 50 mg by mouth 2 (two) times daily.    [provider]  benazepril-hydrochlorthiazide (LOTENSIN HCT) 20-25 MG tablet Take 1 tablet by mouth daily. 01/08/19   [provider]  calcium carbonate (OSCAL) 1500 (600 Ca) MG TABS tablet Take 600 mg of elemental calcium by mouth 2 (two) times daily with a meal.    [provider]   diclofenac sodium (VOLTAREN) 1 % GEL Apply 4 g topically 4 (four) times daily as needed for pain. 09/13/18   [provider]  gabapentin (NEURONTIN) 100 MG capsule Take 100 mg by mouth at bedtime. 07/18/18   [provider]  guaiFENesin (MUCINEX) 600 MG 12 hr tablet Take 600 mg by mouth 2 (two) times daily as needed for cough.    [provider]  hydrALAZINE (APRESOLINE) 100 MG tablet Take 100 mg by mouth 3 (three) times daily. 09/04/20   [provider]  metFORMIN (GLUCOPHAGE) 500 MG tablet Take 500 mg by mouth 2 (two) times daily with a meal.     [provider]  methimazole (TAPAZOLE) 5 MG tablet Take 0.5 tablets (2.5 mg total) by mouth daily. 02/04/19   Hosie Poisson, MD  Multiple Vitamin (MULTIVITAMIN WITH MINERALS) TABS tablet Take 1 tablet by mouth daily.    [provider]  pantoprazole (PROTONIX) 40 MG tablet Take 1 tablet (40 mg total) by mouth daily at 6 (six) AM. Patient taking differently: Take 40 mg by mouth at bedtime. 06/30/20   Thurnell Lose, MD  simvastatin (ZOCOR) 20 MG tablet Take 1 tablet (20 mg total) by mouth at bedtime. 06/30/20   Thurnell Lose, MD  traMADol (ULTRAM) 50 MG tablet Take 1 tablet (50 mg total) by mouth every 6 (six) hours as needed (mild pain). 09/22/20   Gold, Wilder Glade, PA-C  vitamin B-12 (CYANOCOBALAMIN) 1000 MCG tablet Take 1,000 mcg by mouth daily.    [provider]  vitamin C (ASCORBIC ACID) 500 MG tablet Take 500 mg by mouth daily.    [provider]  Vitamin D, Cholecalciferol, 25 MCG (1000 UT) TABS Take 1,000 Units by mouth daily.    [provider]  zinc gluconate 50 MG tablet Take 50 mg by mouth daily.    [provider]    Allergies    Patient has no known allergies.  Review of Systems   Review of Systems  Constitutional:  Negative for chills and fever.  HENT:  Negative for ear pain and sore throat.   Eyes:  Negative for pain and visual disturbance.   Respiratory:  Positive for shortness of breath. Negative for cough.   Cardiovascular:  Positive for chest pain. Negative for palpitations.  Gastrointestinal:  Positive for nausea. Negative for abdominal pain.  Genitourinary:  Negative for dysuria and hematuria.  Musculoskeletal:  Negative for arthralgias and back pain.  Skin:  Negative for color change and rash.  Neurological:  Positive for light-headedness. Negative for seizures and syncope.  All other systems reviewed and are negative.  Physical Exam Updated Vital Signs BP (!) 177/72 (BP Location: Right Arm)   Pulse 81   Temp 97.7 F (36.5 C) (Oral)   Resp (!) 22   SpO2 94%   Physical Exam Vitals and nursing note reviewed.  Constitutional:  General: She is not in acute distress. HENT:     Head: Normocephalic and atraumatic.  Eyes:     Conjunctiva/sclera: Conjunctivae normal.     Pupils: Pupils are equal, round, and reactive to light.  Cardiovascular:     Rate and Rhythm: Normal rate and regular rhythm.     Pulses: Normal pulses.  Pulmonary:     Effort: Pulmonary effort is normal. No respiratory distress.     Breath sounds: Normal breath sounds.  Abdominal:     General: There is no distension.     Tenderness: There is no guarding.  Musculoskeletal:        General: No deformity or signs of injury.     Cervical back: Normal range of motion and neck supple.     Right lower leg: No edema.     Left lower leg: No edema.  Skin:    Findings: No lesion or rash.  Neurological:     General: No focal deficit present.     Mental Status: She is alert. Mental status is at baseline.    ED Results / Procedures / Treatments   Labs (all labs ordered are listed, but only abnormal results are displayed) Labs Reviewed  CBC WITH DIFFERENTIAL/PLATELET - Abnormal; Notable for the following components:      Result Value   Hemoglobin 10.3 (*)    HCT 30.9 (*)    MCV 79.4 (*)    All other components within normal limits   COMPREHENSIVE METABOLIC PANEL - Abnormal; Notable for the following components:   Sodium 125 (*)    Potassium 3.3 (*)    Chloride 87 (*)    Glucose, Bld 161 (*)    Creatinine, Ser 1.12 (*)    Calcium 8.8 (*)    Total Protein 6.1 (*)    Albumin 3.1 (*)    Alkaline Phosphatase 33 (*)    GFR, Estimated 50 (*)    All other components within normal limits  TROPONIN I (HIGH SENSITIVITY) - Abnormal; Notable for the following components:   Troponin I (High Sensitivity) 35 (*)    All other components within normal limits  TROPONIN I (HIGH SENSITIVITY) - Abnormal; Notable for the following components:   Troponin I (High Sensitivity) 36 (*)    All other components within normal limits  URINALYSIS, ROUTINE W REFLEX MICROSCOPIC    EKG None  Radiology CT HEAD WO CONTRAST (5MM)  Result Date: 09/24/2020 CLINICAL DATA:  Dizziness. EXAM: CT HEAD WITHOUT CONTRAST TECHNIQUE: Contiguous axial images were obtained from the base of the skull through the vertex without intravenous contrast. COMPARISON:  CT head 06/26/2020. FINDINGS: Brain: There is stable mild periventricular white matter hypodensity, likely chronic small vessel ischemic change. Vascular: Atherosclerotic calcifications are present within the cavernous internal carotid arteries. Skull: Normal. Negative for fracture or focal lesion. Sinuses/Orbits: No acute finding. Other: None. IMPRESSION: No acute intracranial abnormality. Stable mild chronic small vessel ischemic changes. Electronically Signed   By: Ronney Asters M.D.   On: 09/24/2020 17:28    Procedures Procedures   Medications Ordered in ED Medications - No data to display  ED Course  I have reviewed the triage vital signs and the nursing notes.  Pertinent labs & imaging results that were available during my care of the patient were reviewed by me and considered in my medical decision making (see chart for details).    MDM Rules/Calculators/A&P  80 year old female with a history of DM2, GERD, HLD, HTN, small bowel obstruction, recent robotic resection of a mediastinal mass within the past week, discharge home to recover presenting to the emergency department with an episode of lightheadedness, shortness of breath.  The patient endorses left-sided chest discomfort that is pleuritic.  She endorses some associated nausea.  She was diagnosed with COVID-19 roughly 3 months ago.  She was admitted to the hospital 09/17/2020 and underwent robotic assisted resection of an anterior mediastinal mass.  She endorses generalized fatigue and weakness.  Her chest pain is sharp and nonradiating and associated with shortness of breath.    Differential diagnosis includes repeat/recurrent COVID-19 infection, other viral URI, bacterial pneumonia, pleural effusion, PE, ACS, gastritis, peptic ulcer disease.   Initial labs from triage concerning for multiple electrolyte abnormalities to include hyponatremia to 125, hypokalemia to 3.3, hypochloremia to 87, hyperglycemia to 161, elevated creatinine to 1.12.  She additionally has an elevated troponin to 35 with a repeat mildly uptrending to 36.  Given her recent surgery, pleuritic chest pain, will evaluate further with CTA imaging to evaluate for PE and other intra thoracic abnormality.  COVID-19 and influenza PCR testing collected and pending.  Given her multiple electrolyte abnormalities, significant fatigue, suspect the patient will need admission to the hospital pending work-up.  We will plan for reassessment following repeat troponins and imaging work-up.  Final Clinical Impression(s) / ED Diagnoses Final diagnoses:  None    Rx / DC Orders ED Discharge Orders     None        Regan Lemming, MD 09/25/20 0101

## 2020-09-25 ENCOUNTER — Ambulatory Visit: Payer: Medicare HMO | Admitting: Thoracic Surgery (Cardiothoracic Vascular Surgery)

## 2020-09-25 ENCOUNTER — Encounter (HOSPITAL_COMMUNITY): Payer: Self-pay | Admitting: Internal Medicine

## 2020-09-25 DIAGNOSIS — I110 Hypertensive heart disease with heart failure: Secondary | ICD-10-CM | POA: Diagnosis not present

## 2020-09-25 DIAGNOSIS — K760 Fatty (change of) liver, not elsewhere classified: Secondary | ICD-10-CM | POA: Diagnosis not present

## 2020-09-25 DIAGNOSIS — Z79899 Other long term (current) drug therapy: Secondary | ICD-10-CM | POA: Diagnosis not present

## 2020-09-25 DIAGNOSIS — Z7984 Long term (current) use of oral hypoglycemic drugs: Secondary | ICD-10-CM | POA: Diagnosis not present

## 2020-09-25 DIAGNOSIS — Z7982 Long term (current) use of aspirin: Secondary | ICD-10-CM | POA: Diagnosis not present

## 2020-09-25 DIAGNOSIS — N179 Acute kidney failure, unspecified: Secondary | ICD-10-CM | POA: Diagnosis not present

## 2020-09-25 DIAGNOSIS — E0590 Thyrotoxicosis, unspecified without thyrotoxic crisis or storm: Secondary | ICD-10-CM | POA: Diagnosis not present

## 2020-09-25 DIAGNOSIS — J9811 Atelectasis: Secondary | ICD-10-CM | POA: Diagnosis not present

## 2020-09-25 DIAGNOSIS — K219 Gastro-esophageal reflux disease without esophagitis: Secondary | ICD-10-CM | POA: Diagnosis not present

## 2020-09-25 DIAGNOSIS — Z20822 Contact with and (suspected) exposure to covid-19: Secondary | ICD-10-CM | POA: Diagnosis not present

## 2020-09-25 DIAGNOSIS — E119 Type 2 diabetes mellitus without complications: Secondary | ICD-10-CM | POA: Diagnosis not present

## 2020-09-25 DIAGNOSIS — R0602 Shortness of breath: Secondary | ICD-10-CM | POA: Diagnosis present

## 2020-09-25 DIAGNOSIS — Z8616 Personal history of COVID-19: Secondary | ICD-10-CM | POA: Diagnosis not present

## 2020-09-25 DIAGNOSIS — M1990 Unspecified osteoarthritis, unspecified site: Secondary | ICD-10-CM | POA: Diagnosis not present

## 2020-09-25 DIAGNOSIS — I5033 Acute on chronic diastolic (congestive) heart failure: Secondary | ICD-10-CM | POA: Diagnosis not present

## 2020-09-25 DIAGNOSIS — R42 Dizziness and giddiness: Secondary | ICD-10-CM | POA: Diagnosis not present

## 2020-09-25 DIAGNOSIS — E785 Hyperlipidemia, unspecified: Secondary | ICD-10-CM | POA: Diagnosis not present

## 2020-09-25 DIAGNOSIS — Z7983 Long term (current) use of bisphosphonates: Secondary | ICD-10-CM | POA: Diagnosis not present

## 2020-09-25 DIAGNOSIS — E039 Hypothyroidism, unspecified: Secondary | ICD-10-CM | POA: Diagnosis not present

## 2020-09-25 DIAGNOSIS — M810 Age-related osteoporosis without current pathological fracture: Secondary | ICD-10-CM | POA: Diagnosis not present

## 2020-09-25 DIAGNOSIS — I6782 Cerebral ischemia: Secondary | ICD-10-CM | POA: Diagnosis not present

## 2020-09-25 DIAGNOSIS — J9601 Acute respiratory failure with hypoxia: Secondary | ICD-10-CM | POA: Diagnosis not present

## 2020-09-25 DIAGNOSIS — I700 Atherosclerosis of aorta: Secondary | ICD-10-CM | POA: Diagnosis not present

## 2020-09-25 DIAGNOSIS — E871 Hypo-osmolality and hyponatremia: Secondary | ICD-10-CM | POA: Diagnosis not present

## 2020-09-25 LAB — BASIC METABOLIC PANEL
Anion gap: 10 (ref 5–15)
Anion gap: 10 (ref 5–15)
BUN: 9 mg/dL (ref 8–23)
BUN: 12 mg/dL (ref 8–23)
CO2: 28 mmol/L (ref 22–32)
CO2: 30 mmol/L (ref 22–32)
Calcium: 8.9 mg/dL (ref 8.9–10.3)
Calcium: 8.7 mg/dL — ABNORMAL LOW (ref 8.9–10.3)
Chloride: 94 mmol/L — ABNORMAL LOW (ref 98–111)
Chloride: 92 mmol/L — ABNORMAL LOW (ref 98–111)
Creatinine, Ser: 0.86 mg/dL (ref 0.44–1.00)
Creatinine, Ser: 0.9 mg/dL (ref 0.44–1.00)
GFR, Estimated: >60 mL/min (ref >60)
GFR, Estimated: >60 mL/min (ref >60)
Glucose, Bld: 101 mg/dL — ABNORMAL HIGH (ref 70–99)
Glucose, Bld: 109 mg/dL — ABNORMAL HIGH (ref 70–99)
Potassium: 3.4 mmol/L — ABNORMAL LOW (ref 3.5–5.1)
Potassium: 3.5 mmol/L (ref 3.5–5.1)
Sodium: 132 mmol/L — ABNORMAL LOW (ref 135–145)
Sodium: 132 mmol/L — ABNORMAL LOW (ref 135–145)

## 2020-09-25 LAB — CBC
HCT: 30.6 % — ABNORMAL LOW (ref 36.0–46.0)
Hemoglobin: 10.2 g/dL — ABNORMAL LOW (ref 12.0–15.0)
MCH: 26.8 pg (ref 26.0–34.0)
MCHC: 33.3 g/dL (ref 30.0–36.0)
MCV: 80.5 fL (ref 80.0–100.0)
Platelets: 368 10*3/uL (ref 150–400)
RBC: 3.8 MIL/uL — ABNORMAL LOW (ref 3.87–5.11)
RDW: 14.9 % (ref 11.5–15.5)
WBC: 5.7 10*3/uL (ref 4.0–10.5)
nRBC: 0 % (ref 0.0–0.2)

## 2020-09-25 LAB — CBG MONITORING, ED
Glucose-Capillary: 112 mg/dL — ABNORMAL HIGH (ref 70–99)
Glucose-Capillary: 176 mg/dL — ABNORMAL HIGH (ref 70–99)

## 2020-09-25 LAB — GLUCOSE, CAPILLARY
Glucose-Capillary: 139 mg/dL — ABNORMAL HIGH (ref 70–99)
Glucose-Capillary: 114 mg/dL — ABNORMAL HIGH (ref 70–99)

## 2020-09-25 LAB — OSMOLALITY, URINE: Osmolality, Ur: 236 mOsm/kg — ABNORMAL LOW (ref 300–900)

## 2020-09-25 LAB — BRAIN NATRIURETIC PEPTIDE: B Natriuretic Peptide: 93.6 pg/mL (ref 0.0–100.0)

## 2020-09-25 LAB — SODIUM, URINE, RANDOM: Sodium, Ur: 77 mmol/L

## 2020-09-25 MED ORDER — INSULIN ASPART 100 UNIT/ML IJ SOLN
0.0000 [IU] | Freq: Three times a day (TID) | INTRAMUSCULAR | Status: DC
  Administered 2020-09-25 (×2): 2 [IU] via SUBCUTANEOUS
  Administered 2020-09-26: 1 [IU] via SUBCUTANEOUS

## 2020-09-25 MED ORDER — POTASSIUM CHLORIDE CRYS ER 20 MEQ PO TBCR
30.0000 meq | EXTENDED_RELEASE_TABLET | ORAL | Status: AC
  Administered 2020-09-25 (×2): 30 meq via ORAL
  Filled 2020-09-25 (×2): qty 1

## 2020-09-25 MED ORDER — FUROSEMIDE 10 MG/ML IJ SOLN
20.0000 mg | Freq: Once | INTRAMUSCULAR | Status: AC
  Administered 2020-09-25: 20 mg via INTRAVENOUS
  Filled 2020-09-25: qty 2

## 2020-09-25 MED ORDER — AMLODIPINE BESYLATE 10 MG PO TABS
10.0000 mg | ORAL_TABLET | Freq: Every day | ORAL | Status: DC
  Administered 2020-09-25 – 2020-09-26 (×2): 10 mg via ORAL
  Filled 2020-09-25: qty 1
  Filled 2020-09-25: qty 2

## 2020-09-25 MED ORDER — TRAMADOL HCL 50 MG PO TABS
50.0000 mg | ORAL_TABLET | Freq: Three times a day (TID) | ORAL | Status: DC | PRN
  Administered 2020-09-25 – 2020-09-26 (×2): 50 mg via ORAL
  Filled 2020-09-25 (×2): qty 1

## 2020-09-25 MED ORDER — SIMVASTATIN 20 MG PO TABS
20.0000 mg | ORAL_TABLET | Freq: Every day | ORAL | Status: DC
  Administered 2020-09-25: 20 mg via ORAL
  Filled 2020-09-25: qty 1

## 2020-09-25 MED ORDER — ACETAMINOPHEN 325 MG PO TABS
650.0000 mg | ORAL_TABLET | Freq: Four times a day (QID) | ORAL | Status: DC | PRN
  Administered 2020-09-26: 650 mg via ORAL
  Filled 2020-09-25: qty 2

## 2020-09-25 MED ORDER — ASPIRIN EC 81 MG PO TBEC
81.0000 mg | DELAYED_RELEASE_TABLET | Freq: Every day | ORAL | Status: DC
  Administered 2020-09-25 – 2020-09-26 (×2): 81 mg via ORAL
  Filled 2020-09-25 (×2): qty 1

## 2020-09-25 MED ORDER — GABAPENTIN 100 MG PO CAPS
100.0000 mg | ORAL_CAPSULE | Freq: Every day | ORAL | Status: DC
  Administered 2020-09-25: 100 mg via ORAL
  Filled 2020-09-25: qty 1

## 2020-09-25 MED ORDER — ENOXAPARIN SODIUM 40 MG/0.4ML IJ SOSY
40.0000 mg | PREFILLED_SYRINGE | INTRAMUSCULAR | Status: DC
  Administered 2020-09-25 – 2020-09-26 (×2): 40 mg via SUBCUTANEOUS
  Filled 2020-09-25 (×2): qty 0.4

## 2020-09-25 MED ORDER — HYDRALAZINE HCL 50 MG PO TABS
100.0000 mg | ORAL_TABLET | Freq: Three times a day (TID) | ORAL | Status: DC
  Administered 2020-09-25 – 2020-09-26 (×4): 100 mg via ORAL
  Filled 2020-09-25 (×4): qty 2

## 2020-09-25 MED ORDER — PANTOPRAZOLE SODIUM 40 MG PO TBEC
40.0000 mg | DELAYED_RELEASE_TABLET | Freq: Every day | ORAL | Status: DC
  Administered 2020-09-25 – 2020-09-26 (×2): 40 mg via ORAL
  Filled 2020-09-25 (×2): qty 1

## 2020-09-25 MED ORDER — ATENOLOL 25 MG PO TABS
50.0000 mg | ORAL_TABLET | Freq: Two times a day (BID) | ORAL | Status: DC
  Administered 2020-09-25 – 2020-09-26 (×3): 50 mg via ORAL
  Filled 2020-09-25 (×3): qty 2

## 2020-09-25 MED ORDER — VITAMIN B-12 1000 MCG PO TABS
1000.0000 ug | ORAL_TABLET | Freq: Every day | ORAL | Status: DC
  Administered 2020-09-25 – 2020-09-26 (×2): 1000 ug via ORAL
  Filled 2020-09-25 (×2): qty 1

## 2020-09-25 MED ORDER — METHIMAZOLE 5 MG PO TABS
2.5000 mg | ORAL_TABLET | Freq: Every day | ORAL | Status: DC
  Administered 2020-09-25 – 2020-09-26 (×2): 2.5 mg via ORAL
  Filled 2020-09-25 (×2): qty 1

## 2020-09-25 MED ORDER — MELATONIN 5 MG PO TABS
10.0000 mg | ORAL_TABLET | Freq: Every evening | ORAL | Status: DC | PRN
  Administered 2020-09-25: 10 mg via ORAL
  Filled 2020-09-25: qty 2

## 2020-09-25 MED ORDER — HYDRALAZINE HCL 20 MG/ML IJ SOLN: 10.0000 mg | INTRAMUSCULAR | Status: DC | PRN

## 2020-09-25 NOTE — ED Notes (Signed)
Placed back on 2 liters 02 [place] for sat 89%.

## 2020-09-25 NOTE — Progress Notes (Signed)
Patient seen and examined; admitted after midnight secondary to weakness, SOB  and hypoxia. Work up demonstrating vascular congestion in her CXR and fine crackles on exam, daughter at bedside also expressed inability laying flat on bed (consistent with orthopnea) and decrease appetite. VS are otherwise stable and patient denies CP, nausea and vomiting. Please refer to H&P written by Dr. Hal Hope for further info/details on admission.    Plan: -continue to follow daily weights and strict intake/output -will give another dose of IV lasix and follow volume status in am prior to provide further diuretics -wean O2 as tolerated -education about low sodium diet provided -follow renal function and electrolytes closely.  Barton Dubois MD [phone]

## 2020-09-25 NOTE — ED Notes (Signed)
Pt placed on 2L [place] due to o2 sat 86%.

## 2020-09-25 NOTE — H&P (Signed)
History and Physical    Mandy [name] HEN:[id number] DOB: 1940-10-28 DOA: 09/24/2020  PCP: [name], MD  Patient coming from: Home.  Chief Complaint: Weakness.  History was obtained through patient's daughter as patient speaks [place].  HPI: Mandy [name] is a 80 y.o. 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  Nausea and poor appetite was brought to the ER.  Denies any fever chills or chest pain.  As per the daughter patient has benign nonproductive cough.  ED Course: In the ER patient is found to be persistently hypoxic requiring 2 L oxygen to maintain sats.  Initially was given a fluid bolus given the patient's worsening renal function hyponatremia.  CT angiogram of the chest x-ray shows bilateral pleural effusion more on the right side.  Labs show COVID-negative with creatinine 1.1 which increased from normal last week.  High sensitive troponin was flat at 35 and 36.  Hemoglobin 10.3.  Patient admitted for further management of acute hypoxic respiratory failure requiring 2 L oxygen which is new for the patient likely from pleural effusion and fluid overload.  Review of Systems: As per HPI, rest all negative.   Past Medical History:  Diagnosis Date   Aortic atherosclerosis ([place]) 10/08/2018   Arthritis    Diabetes mellitus without complication (HCC)    GERD (gastroesophageal reflux disease)    Hepatic steatosis 10/08/2018   Hyperlipidemia    Hypertension    Hyperthyroidism    Mass of right ovary 10/08/2018   Osteoporosis    Pneumonia    SBO (small bowel obstruction) ([place]) 08/09/2018   SBO (small bowel obstruction) ([place]) 08/09/2018    Past  Surgical History:  Procedure Laterality Date   CATARACT EXTRACTION W/ INTRAOCULAR LENS  IMPLANT, BILATERAL     COLONOSCOPY       reports that she has never smoked. She has never used smokeless tobacco. She reports that she does not drink alcohol and does not use drugs.  No Known Allergies  Family History  Problem Relation Age of Onset   Kidney disease Son    Colon cancer Neg Hx    Breast cancer Neg Hx     Prior to Admission medications   Medication Sig Start Date End Date Taking? Authorizing Provider  albuterol (VENTOLIN HFA) 108 (90 Base) MCG/ACT inhaler Inhale 2 puffs into the lungs every 6 (six) hours as needed for wheezing or shortness of breath. 06/30/20   Thurnell Lose, MD  alendronate (FOSAMAX) 70 MG tablet Take 70 mg by mouth every Tuesday. Take with a full glass of water on an empty stomach.    [provider]  amLODipine (NORVASC) 10 MG tablet Take 10 mg by mouth daily.    [provider]  aspirin EC 81 MG tablet Take 81 mg by mouth daily. Swallow whole.    [provider]  atenolol (TENORMIN) 50 MG tablet Take 50 mg by mouth 2 (two) times daily.    [provider]  benazepril-hydrochlorthiazide (LOTENSIN HCT) 20-25 MG tablet Take 1 tablet by mouth daily. 01/08/19   [provider]  calcium carbonate (OSCAL) 1500 (600 Ca) MG TABS tablet Take 600 mg of elemental calcium by mouth 2 (  two) times daily with a meal.    [provider]  diclofenac sodium (VOLTAREN) 1 % GEL Apply 4 g topically 4 (four) times daily as needed for pain. 09/13/18   [provider]  gabapentin (NEURONTIN) 100 MG capsule Take 100 mg by mouth at bedtime. 07/18/18   [provider]  guaiFENesin (MUCINEX) 600 MG 12 hr tablet Take 600 mg by mouth 2 (two) times daily as needed for cough.    [provider]  hydrALAZINE (APRESOLINE) 100 MG tablet Take 100 mg by mouth 3 (three) times daily. 09/04/20   [provider]  metFORMIN  (GLUCOPHAGE) 500 MG tablet Take 500 mg by mouth 2 (two) times daily with a meal.     [provider]  methimazole (TAPAZOLE) 5 MG tablet Take 0.5 tablets (2.5 mg total) by mouth daily. 02/04/19   Hosie Poisson, MD  Multiple Vitamin (MULTIVITAMIN WITH MINERALS) TABS tablet Take 1 tablet by mouth daily.    [provider]  pantoprazole (PROTONIX) 40 MG tablet Take 1 tablet (40 mg total) by mouth daily at 6 (six) AM. Patient taking differently: Take 40 mg by mouth at bedtime. 06/30/20   Thurnell Lose, MD  simvastatin (ZOCOR) 20 MG tablet Take 1 tablet (20 mg total) by mouth at bedtime. 06/30/20   Thurnell Lose, MD  traMADol (ULTRAM) 50 MG tablet Take 1 tablet (50 mg total) by mouth every 6 (six) hours as needed (mild pain). 09/22/20   Gold, Wilder Glade, PA-C  vitamin B-12 (CYANOCOBALAMIN) 1000 MCG tablet Take 1,000 mcg by mouth daily.    [provider]  vitamin C (ASCORBIC ACID) 500 MG tablet Take 500 mg by mouth daily.    [provider]  Vitamin D, Cholecalciferol, 25 MCG (1000 UT) TABS Take 1,000 Units by mouth daily.    [provider]  zinc gluconate 50 MG tablet Take 50 mg by mouth daily.    [provider]    Physical Exam: Constitutional: Moderately built and nourished. Vitals:   09/24/20 2215 09/24/20 2334 09/25/20 0030 09/25/20 0145  BP: (!) 180/70 (!) 174/82 (!) 179/72 (!) 156/58  Pulse: 87 87 91 84  Resp: (!) 21 (!) 25 (!) 27 17  Temp:      TempSrc:      SpO2: 93% 90% 91% 91%   Eyes: Anicteric no pallor. ENMT: No discharge from the ears eyes nose and mouth. Neck: No mass felt.  No neck rigidity. Respiratory: No rhonchi or crepitations. Cardiovascular: S1-S2 heard. Abdomen: Soft nontender bowel sound present. Musculoskeletal: No edema. Skin: Suture site looks healed. Neurologic: Alert awake oriented to time place and person.  Moves all extremities. Psychiatric: Appears normal.  Normal affect.   Labs on Admission: I have  personally reviewed following labs and imaging studies  CBC: Recent Labs  Lab 09/18/20 0500 09/19/20 0602 09/22/20 0043 09/24/20 1536  WBC 8.0 6.3 6.5 6.2  NEUTROABS  --   --   --  4.1  HGB 9.4* 9.8* 9.6* 10.3*  HCT 28.7* 29.5* 28.9* 30.9*  MCV 80.6 80.6 80.1 79.4*  PLT 305 289 327 710   Basic Metabolic Panel: Recent Labs  Lab 09/18/20 0500 09/19/20 0602 09/20/20 0024 09/22/20 0043 09/24/20 1536 09/24/20 2109  NA 129* 128* 128* 130* 125*  --   K 3.5 3.3* 3.7 3.5 3.3*  --   CL 87* 90* 95* 93* 87*  --   CO2 30 28 26 29 27   --   GLUCOSE  106* 102* 105* 97 161*  --   BUN 16 17 16 12 14   --   CREATININE 1.64* 1.38* 1.30* 0.93 1.12*  --   CALCIUM 8.8* 8.5* 8.3* 8.4* 8.8*  --   MG  --   --   --   --   --  1.7   GFR: Estimated Creatinine Clearance: 36.5 mL/min (A) (by C-G formula based on SCr of 1.12 mg/dL (H)). Liver Function Tests: Recent Labs  Lab 09/19/20 0602 09/24/20 1536  AST 19 16  ALT 5 7  ALKPHOS 30* 33*  BILITOT 0.6 0.4  PROT 5.7* 6.1*  ALBUMIN 3.1* 3.1*   No results for input(s): LIPASE, AMYLASE in the last 168 hours. No results for input(s): AMMONIA in the last 168 hours. Coagulation Profile: No results for input(s): INR, PROTIME in the last 168 hours. Cardiac Enzymes: No results for input(s): CKTOTAL, CKMB, CKMBINDEX, TROPONINI in the last 168 hours. BNP (last 3 results) No results for input(s): PROBNP in the last 8760 hours. HbA1C: No results for input(s): HGBA1C in the last 72 hours. CBG: Recent Labs  Lab 09/22/20 0008 09/22/20 0406 09/22/20 0732 09/22/20 1038 09/22/20 1512  GLUCAP 93 108* 107* 152* 166*   Lipid Profile: No results for input(s): CHOL, HDL, LDLCALC, TRIG, CHOLHDL, LDLDIRECT in the last 72 hours. Thyroid Function Tests: No results for input(s): TSH, T4TOTAL, FREET4, T3FREE, THYROIDAB in the last 72 hours. Anemia Panel: No results for input(s): VITAMINB12, FOLATE, FERRITIN, TIBC, IRON, RETICCTPCT in the last 72  hours. Urine analysis:    Component Value Date/Time   COLORURINE YELLOW 09/24/2020 1536   APPEARANCEUR HAZY (A) 09/24/2020 1536   LABSPEC 1.016 09/24/2020 1536   PHURINE 5.0 09/24/2020 1536   GLUCOSEU NEGATIVE 09/24/2020 1536   HGBUR NEGATIVE 09/24/2020 1536   BILIRUBINUR NEGATIVE 09/24/2020 1536   KETONESUR NEGATIVE 09/24/2020 1536   PROTEINUR NEGATIVE 09/24/2020 1536   NITRITE NEGATIVE 09/24/2020 1536   LEUKOCYTESUR TRACE (A) 09/24/2020 1536   Sepsis Labs: @LABRCNTIP (procalcitonin:4,lacticidven:4) ) Recent Results (from the past 240 hour(s))  Surgical pcr screen     Status: None   Collection Time: 09/16/20  8:29 AM   Specimen: Nasal Mucosa; Nasal Swab  Result Value Ref Range Status   MRSA, PCR NEGATIVE NEGATIVE Final   Staphylococcus aureus NEGATIVE NEGATIVE Final    Comment: (NOTE) The Xpert SA Assay (FDA approved for NASAL specimens in patients 60 years of age and older), is one component of a comprehensive surveillance program. It is not intended to diagnose infection nor to guide or monitor treatment. Performed at [facility], [place] [address] [place], [place] [address]   Resp Panel by RT-PCR (Flu A&B, Covid) Nasopharyngeal Swab     Status: None   Collection Time: 09/24/20 10:30 PM   Specimen: Nasopharyngeal Swab; Nasopharyngeal(NP) swabs in vial transport medium  Result Value Ref Range Status   SARS Coronavirus 2 by RT PCR NEGATIVE NEGATIVE Final    Comment: (NOTE) SARS-CoV-2 target nucleic acids are NOT DETECTED.  The SARS-CoV-2 RNA is generally detectable in upper respiratory specimens during the acute phase of infection. The lowest concentration of SARS-CoV-2 viral copies this assay can detect is 138 copies/mL. A negative result does not preclude SARS-Cov-2 infection and should not be used as the sole basis for treatment or other patient management decisions. A negative result may occur with  improper specimen collection/handling, submission of specimen  other than nasopharyngeal swab, presence of viral mutation(s) within the areas targeted by this assay,  and inadequate number of viral copies(<138 copies/mL). A negative result must be combined with clinical observations, patient history, and epidemiological information. The expected result is Negative.  Fact Sheet for Patients:  EntrepreneurPulse.com.au  Fact Sheet for Healthcare Providers:  IncredibleEmployment.be  This test is no t yet approved or cleared by the [place] FDA and  has been authorized for detection and/or diagnosis of SARS-CoV-2 by FDA under an Emergency Use Authorization (EUA). This EUA will remain  in effect (meaning this test can be used) for the duration of the COVID-19 declaration under Section 564(b)(1) of the Act, 21 U.S.C.section 360bbb-3(b)(1), unless the authorization is terminated  or revoked sooner.       Influenza A by PCR NEGATIVE NEGATIVE Final   Influenza B by PCR NEGATIVE NEGATIVE Final    Comment: (NOTE) The Xpert Xpress SARS-CoV-2/FLU/RSV plus assay is intended as an aid in the diagnosis of influenza from Nasopharyngeal swab specimens and should not be used as a sole basis for treatment. Nasal washings and aspirates are unacceptable for Xpert Xpress SARS-CoV-2/FLU/RSV testing.  Fact Sheet for Patients: EntrepreneurPulse.com.au  Fact Sheet for Healthcare Providers: IncredibleEmployment.be  This test is not yet approved or cleared by the [place] FDA and has been authorized for detection and/or diagnosis of SARS-CoV-2 by FDA under an Emergency Use Authorization (EUA). This EUA will remain in effect (meaning this test can be used) for the duration of the COVID-19 declaration under Section 564(b)(1) of the Act, 21 U.S.C. section 360bbb-3(b)(1), unless the authorization is terminated or revoked.  Performed at [facility], [place] [address], [place],  [place] [address]      Radiological Exams on Admission: CT HEAD WO CONTRAST (5MM)  Result Date: 09/24/2020 CLINICAL DATA:  Dizziness. EXAM: CT HEAD WITHOUT CONTRAST TECHNIQUE: Contiguous axial images were obtained from the base of the skull through the vertex without intravenous contrast. COMPARISON:  CT head 06/26/2020. FINDINGS: Brain: There is stable mild periventricular white matter hypodensity, likely chronic small vessel ischemic change. Vascular: Atherosclerotic calcifications are present within the cavernous internal carotid arteries. Skull: Normal. Negative for fracture or focal lesion. Sinuses/Orbits: No acute finding. Other: None. IMPRESSION: No acute intracranial abnormality. Stable mild chronic small vessel ischemic changes. Electronically Signed   By: Ronney Asters M.D.   On: 09/24/2020 17:28   CT Angio Chest PE W and/or Wo Contrast  Result Date: 09/24/2020 CLINICAL DATA:  Pulmonary embolus suspected with high probability. Dizziness and nausea and vomiting. EXAM: CT ANGIOGRAPHY CHEST WITH CONTRAST TECHNIQUE: Multidetector CT imaging of the chest was performed using the standard protocol during bolus administration of intravenous contrast. Multiplanar CT image reconstructions and MIPs were obtained to evaluate the vascular anatomy. CONTRAST:  80mL OMNIPAQUE IOHEXOL 350 MG/ML SOLN COMPARISON:  06/26/2020 FINDINGS: Cardiovascular: Moderately good opacification of the central and segmental pulmonary arteries. No focal filling defects demonstrated. No evidence of significant pulmonary embolus. Cardiac enlargement. No pericardial effusions. Normal caliber thoracic aorta. Great vessel origins are patent. Coronary artery and aortic calcification. Mediastinum/Nodes: Esophagus is decompressed. No significant lymphadenopathy. Thyroid gland is unremarkable. Lungs/Pleura: Bilateral pleural effusions with basilar atelectasis or consolidation, greater on the right. No pneumothorax. Airways are patent. Motion  artifact limits examination. Upper Abdomen: Visualized upper abdomen demonstrates no acute abnormality. Musculoskeletal: Stranding or edema in the right lateral chest wall, possibly indicating contusion or infection. No associated rib fractures are identified. Degenerative changes in the spine and shoulders. Sternum appears intact. Review of the MIP images confirms the above findings. IMPRESSION: 1. No  evidence of significant pulmonary embolus. 2. Bilateral pleural effusions with basilar atelectasis, greater on the right. 3. Cardiac enlargement.  Aortic atherosclerosis. 4. Stranding or edema in the right lateral chest wall may indicate contusion or infection. Correlate with physical examination. Electronically Signed   By: Lucienne Capers M.D.   On: 09/24/2020 23:08   DG Chest Portable 1 View  Result Date: 09/24/2020 CLINICAL DATA:  Cough. EXAM: PORTABLE CHEST 1 VIEW COMPARISON:  Chest x-ray 09/21/2020 FINDINGS: The heart is enlarged. There is central pulmonary vascular congestion. There are small bilateral pleural effusions with some bibasilar opacities. There is atelectasis in the left mid lung. IMPRESSION: Cardiomegaly with central pulmonary vascular congestion and small pleural effusions. Bibasilar infiltrates may represent edema or atelectasis. Electronically Signed   By: Ronney Asters M.D.   On: 09/24/2020 23:13    EKG: Independently reviewed.  Sinus rhythm.  Assessment/Plan Principal Problem:   Acute respiratory failure with hypoxemia (HCC) Active Problems:   Diabetes mellitus type 2, controlled (HCC)   Hyponatremia   Acute respiratory failure with hypoxia (HCC)   Hyperthyroidism   Chronic diastolic CHF (congestive heart failure) (HCC)    Acute respiratory failure with hypoxia with patient requiring 2 L oxygen to maintain sats with CT angiogram of chest showing bilateral pleural effusion which could be reactionary to recent surgery however patient does have history of diastolic dysfunction  per 2D echo done in January 2021 which showed EF of 50 to 55% with grade 1 diastolic dysfunction I have ordered 1 dose of Lasix 20 mg IV following which we will have further doses plan based on the response. Uncontrolled hypertension for which patient will be on as needed IV hydralazine we will continue patient's beta-blocker amlodipine and hydralazine.  Holding patient's benazepril and hydrochlorothiazide due to hyponatremia and acute renal failure. Acute renal failure with hyponatremia could be from poor oral intake.  However we will hold off ACE inhibitor and hydrochlorothiazide for now.  Closely monitor intake output.  Patient did receive fluid bolus in the ER.  However we are giving patient Lasix now because of the hypoxia. Recent surgery done by Dr. Kipp Brood.  On-call cardiothoracic surgeon was notified by the ER physician. Diabetes mellitus type 2 we will keep patient on sliding scale coverage. Hypothyroidism on methimazole.  Note that patient's recent antimediastinal surgery pathology shows thyroid tissue. Anemia follow CBC. Generalized weakness and fatigue could be from deconditioning.  Get physical therapy consult.   DVT prophylaxis: Lovenox. Code Status: Full code. Family Communication: Patient's daughter at the bedside. Disposition Plan: Home. Consults called: ER physician discussed with on-call cardiothoracic surgeon. Admission status: Observation.   Rise Patience MD [facility] Pager [phone].  If 7PM-7AM, please contact night-coverage [email or web address] Password [facility]  09/25/2020, 4:26 AM

## 2020-09-26 DIAGNOSIS — J9601 Acute respiratory failure with hypoxia: Secondary | ICD-10-CM | POA: Diagnosis not present

## 2020-09-26 DIAGNOSIS — R0602 Shortness of breath: Secondary | ICD-10-CM | POA: Diagnosis not present

## 2020-09-26 DIAGNOSIS — I110 Hypertensive heart disease with heart failure: Secondary | ICD-10-CM | POA: Diagnosis not present

## 2020-09-26 LAB — BASIC METABOLIC PANEL
Anion gap: 9 (ref 5–15)
BUN: 14 mg/dL (ref 8–23)
CO2: 28 mmol/L (ref 22–32)
Calcium: 8.8 mg/dL — ABNORMAL LOW (ref 8.9–10.3)
Chloride: 96 mmol/L — ABNORMAL LOW (ref 98–111)
Creatinine, Ser: 1.14 mg/dL — ABNORMAL HIGH (ref 0.44–1.00)
GFR, Estimated: 49 mL/min — ABNORMAL LOW (ref >60)
Glucose, Bld: 93 mg/dL (ref 70–99)
Potassium: 4.4 mmol/L (ref 3.5–5.1)
Sodium: 133 mmol/L — ABNORMAL LOW (ref 135–145)

## 2020-09-26 LAB — GLUCOSE, CAPILLARY
Glucose-Capillary: 106 mg/dL — ABNORMAL HIGH (ref 70–99)
Glucose-Capillary: 128 mg/dL — ABNORMAL HIGH (ref 70–99)

## 2020-09-26 NOTE — Discharge Summary (Signed)
Physician Discharge Summary  Mandy [name] KDT:[id number] DOB: 27-Oct-1940 DOA: 09/24/2020  PCP: [name], MD  Admit date: 09/24/2020 Discharge date: 09/26/2020  Admitted From: Home Disposition: Home with home health  Recommendations for Outpatient Follow-up:  Follow up with PCP in 1-2 weeks Please obtain BMP/CBC in one week Schedule follow-up with cardiothoracic surgery, office should call you.  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  Presented back from home with weakness, nausea and poor appetite.  In the emergency room hemodynamically stable.  Initially hypoxic 86% on mobility requiring 2 L oxygen.  CTA of the chest showed bilateral small pleural effusion.  Hemoglobin is stable.  Electrolytes stable.  Admitted with CT surgery consultation and treated with intermittent doses of diuretics with improvement.  CT surgery called by emergency physician and surgeons recommended conservative management but no intervention needed.  Acute hypoxemic respiratory failure, likely due to multiple factors including basal atelectasis, not taking deep breaths in due to right lateral chest wall postsurgical pain.  Patient was given intermittent doses of IV diuretics with good clinical response.  She is on room air today.  Patient mobilized around, started using incentive spirometry.  She is on room air and saturating more than 90%. Recent echocardiogram with normal ejection fraction  but diastolic dysfunction.  No indication for repeat echocardiogram.  Today she is euvolemic.  We will hold off on further injectable diuretics but resume her benazepril and hydrochlorothiazide.  Other chronic medical issues remained stable.  She has surgical incision on the right lateral chest wall and also has sutures present.  She will follow-up with surgery office next week for reevaluation and suture removal. She will benefit with home health and physical therapy, will prescribe. She will continue to use incentive spirometry at home, increase her mobility around the home.   Discharge Diagnoses:  Principal Problem:   Acute respiratory failure with hypoxemia (HCC) Active Problems:   Diabetes mellitus type 2, controlled ([place])   Hyponatremia   Acute respiratory failure with hypoxia (HCC)   Hyperthyroidism   Chronic diastolic CHF (congestive heart failure) [facility])    Discharge Instructions  Discharge Instructions     Call MD for:  difficulty breathing, headache or visual disturbances   Complete by: As directed    Call MD for:  temperature >100.4   Complete by: As directed    Diet - low sodium heart healthy   Complete by: As directed    Diet Carb Modified   Complete by: As directed    Discharge instructions   Complete by: As directed    Schedule follow-up with cardiothoracic surgery next week.  Call office on 9/26. Continue to use breathing exercises at home.   Increase activity slowly   Complete by: As directed       Allergies as of 09/26/2020   No Known Allergies      Medication List     TAKE these medications    albuterol 108 (90 Base) MCG/ACT inhaler Commonly known as: VENTOLIN HFA Inhale 2 puffs into the lungs every 6 (six) hours as needed for  wheezing or shortness of breath.   alendronate 70 MG tablet Commonly known as: FOSAMAX Take 70 mg by mouth every Tuesday. Take with a full glass of water on an empty stomach.   amLODipine 10 MG tablet Commonly known  as: NORVASC Take 10 mg by mouth daily.   aspirin EC 81 MG tablet Take 81 mg by mouth daily. Swallow whole.   atenolol 50 MG tablet Commonly known as: TENORMIN Take 50 mg by mouth 2 (two) times daily.   benazepril-hydrochlorthiazide 20-25 MG tablet Commonly known as: LOTENSIN HCT Take 1 tablet by mouth daily.   calcium carbonate 1500 (600 Ca) MG Tabs tablet Commonly known as: OSCAL Take 600 mg of elemental calcium by mouth 2 (two) times daily with a meal.   diclofenac sodium 1 % Gel Commonly known as: VOLTAREN Apply 4 g topically 4 (four) times daily as needed for pain. shoulder   gabapentin 100 MG capsule Commonly known as: NEURONTIN Take 100 mg by mouth at bedtime.   guaiFENesin 600 MG 12 hr tablet Commonly known as: MUCINEX Take 600 mg by mouth 2 (two) times daily as needed for cough.   hydrALAZINE 100 MG tablet Commonly known as: APRESOLINE Take 100 mg by mouth 3 (three) times daily.   MELATONIN ER PO Take 2 tablets by mouth at bedtime as needed (sleep).   metFORMIN 500 MG tablet Commonly known as: GLUCOPHAGE Take 500 mg by mouth 2 (two) times daily with a meal.   methimazole 5 MG tablet Commonly known as: TAPAZOLE Take 0.5 tablets (2.5 mg total) by mouth daily.   multivitamin with minerals Tabs tablet Take 1 tablet by mouth daily.   pantoprazole 40 MG tablet Commonly known as: PROTONIX Take 1 tablet (40 mg total) by mouth daily at 6 (six) AM. What changed: when to take this   simvastatin 20 MG tablet Commonly known as: ZOCOR Take 1 tablet (20 mg total) by mouth at bedtime.   traMADol 50 MG tablet Commonly known as: ULTRAM Take 1 tablet (50 mg total) by mouth every 6 (six) hours as needed (mild pain). What changed: reasons to take this   vitamin B-12 1000 MCG tablet Commonly known as: CYANOCOBALAMIN Take 1,000 mcg by mouth daily.   vitamin C 500 MG tablet Commonly known as: ASCORBIC ACID Take 500 mg by mouth daily.   Vitamin D (Cholecalciferol)  25 MCG (1000 UT) Tabs Take 1,000 Units by mouth daily.   zinc gluconate 50 MG tablet Take 50 mg by mouth daily.        Follow-up Information     [name], MD Follow up in 2 week(s).   Specialty: Internal Medicine Contact information: [address] [place] [address] [phone]                No Known Allergies  Consultations: CT surgery, curbside by ER   Procedures/Studies: DG Chest 2 View  Result Date: 09/16/2020 CLINICAL DATA:  80 year old female with preoperative chest x-ray EXAM: CHEST - 2 VIEW COMPARISON:  06/26/2020, chest CT 06/26/2020 FINDINGS: Cardiomediastinal silhouette unchanged in size and contour. No pneumothorax.  No pleural effusion. No confluent airspace disease. Coarsened interstitial markings similar to the prior. Overall improved aeration compared to the prior plain film. IMPRESSION: Negative for acute cardiopulmonary disease, with improved aeration compared to the prior plain film. Electronically Signed   By: Corrie Mckusick D.O.   On: 09/16/2020 13:00   CT HEAD WO CONTRAST (5MM)  Result Date: 09/24/2020 CLINICAL DATA:  Dizziness. EXAM:  CT HEAD WITHOUT CONTRAST TECHNIQUE: Contiguous axial images were obtained from the base of the skull through the vertex without intravenous contrast. COMPARISON:  CT head 06/26/2020. FINDINGS: Brain: There is stable mild periventricular white matter hypodensity, likely chronic small vessel ischemic change. Vascular: Atherosclerotic calcifications are present within the cavernous internal carotid arteries. Skull: Normal. Negative for fracture or focal lesion. Sinuses/Orbits: No acute finding. Other: None. IMPRESSION: No acute intracranial abnormality. Stable mild chronic small vessel ischemic changes. Electronically Signed   By: Ronney Asters M.D.   On: 09/24/2020 17:28   CT Angio Chest PE W and/or Wo Contrast  Result Date: 09/24/2020 CLINICAL DATA:  Pulmonary embolus suspected with high probability. Dizziness  and nausea and vomiting. EXAM: CT ANGIOGRAPHY CHEST WITH CONTRAST TECHNIQUE: Multidetector CT imaging of the chest was performed using the standard protocol during bolus administration of intravenous contrast. Multiplanar CT image reconstructions and MIPs were obtained to evaluate the vascular anatomy. CONTRAST:  1mL OMNIPAQUE IOHEXOL 350 MG/ML SOLN COMPARISON:  06/26/2020 FINDINGS: Cardiovascular: Moderately good opacification of the central and segmental pulmonary arteries. No focal filling defects demonstrated. No evidence of significant pulmonary embolus. Cardiac enlargement. No pericardial effusions. Normal caliber thoracic aorta. Great vessel origins are patent. Coronary artery and aortic calcification. Mediastinum/Nodes: Esophagus is decompressed. No significant lymphadenopathy. Thyroid gland is unremarkable. Lungs/Pleura: Bilateral pleural effusions with basilar atelectasis or consolidation, greater on the right. No pneumothorax. Airways are patent. Motion artifact limits examination. Upper Abdomen: Visualized upper abdomen demonstrates no acute abnormality. Musculoskeletal: Stranding or edema in the right lateral chest wall, possibly indicating contusion or infection. No associated rib fractures are identified. Degenerative changes in the spine and shoulders. Sternum appears intact. Review of the MIP images confirms the above findings. IMPRESSION: 1. No evidence of significant pulmonary embolus. 2. Bilateral pleural effusions with basilar atelectasis, greater on the right. 3. Cardiac enlargement.  Aortic atherosclerosis. 4. Stranding or edema in the right lateral chest wall may indicate contusion or infection. Correlate with physical examination. Electronically Signed   By: Lucienne Capers M.D.   On: 09/24/2020 23:08   DG Chest Portable 1 View  Result Date: 09/24/2020 CLINICAL DATA:  Cough. EXAM: PORTABLE CHEST 1 VIEW COMPARISON:  Chest x-ray 09/21/2020 FINDINGS: The heart is enlarged. There is  central pulmonary vascular congestion. There are small bilateral pleural effusions with some bibasilar opacities. There is atelectasis in the left mid lung. IMPRESSION: Cardiomegaly with central pulmonary vascular congestion and small pleural effusions. Bibasilar infiltrates may represent edema or atelectasis. Electronically Signed   By: Ronney Asters M.D.   On: 09/24/2020 23:13   DG CHEST PORT 1 VIEW  Result Date: 09/21/2020 CLINICAL DATA:  Status post mediastinal mass removal. EXAM: PORTABLE CHEST 1 VIEW COMPARISON:  September 20, 2020. FINDINGS: Stable cardiomegaly. No pneumothorax is noted. Stable right basilar opacity is noted concerning for pneumonia or atelectasis with associated effusion. Bony thorax is unremarkable. Minimal left basilar subsegmental atelectasis is noted. IMPRESSION: Stable right basilar pneumonia or atelectasis with associated pleural effusion. Aortic Atherosclerosis (ICD10-I70.0). Electronically Signed   By: Marijo Conception M.D.   On: 09/21/2020 08:16   DG CHEST PORT 1 VIEW  Result Date: 09/20/2020 CLINICAL DATA:  Postop check. EXAM: PORTABLE CHEST 1 VIEW COMPARISON:  September 19, 2020 FINDINGS: Increasing infiltrate in the right base. The left chest tube and right central line have been removed. Mild atelectasis in the left mid lower lung identified. There may be a small left effusion. No pneumothorax. Stable cardiomegaly. The hila and mediastinum are  unremarkable. IMPRESSION: 1. Increasing infiltrate in the right base now favored to represent pneumonia or aspiration. Recommend clinical correlation and follow-up imaging to ensure resolution. 2. Interval removal of left chest tube without pneumothorax. 3. Probable atelectasis in the left mid lower lung with a possible small associated effusion. These findings are similar in the interval. Electronically Signed   By: Dorise Bullion III M.D.   On: 09/20/2020 08:22   DG CHEST PORT 1 VIEW  Result Date: 09/19/2020 CLINICAL DATA:   Follow-up surgery. EXAM: PORTABLE CHEST 1 VIEW COMPARISON:  September 18, 2020 FINDINGS: The left chest tube is stable. No pneumothorax. The right central line terminates near the cavoatrial junction. Stable cardiomegaly. The hila and mediastinum are unchanged. Mild atelectasis in the right base. No other changes. IMPRESSION: 1. Support apparatus as above.  No pneumothorax. 2. Mild opacity in the right base favored to represent atelectasis. Recommend attention on follow-up. 3. No other interval changes. Electronically Signed   By: Dorise Bullion III M.D.   On: 09/19/2020 08:40   DG Chest Port 1 View  Result Date: 09/18/2020 CLINICAL DATA:  Chest tube EXAM: PORTABLE CHEST 1 VIEW COMPARISON:  09/17/2020 FINDINGS: Left chest tube and right central line remain in place, unchanged. No pneumothorax. Cardiomegaly with vascular congestion. Bilateral perihilar and lower lobe opacities, favor a combination of edema and atelectasis. No visible effusions or acute bony abnormality. IMPRESSION: Decreasing lung volumes with increasing perihilar and lower lobe opacities, likely a combination of edema and atelectasis. Left chest tube remains in place.  No pneumothorax. Electronically Signed   By: Rolm Baptise M.D.   On: 09/18/2020 08:52   DG Chest Port 1 View  Result Date: 09/17/2020 CLINICAL DATA:  Chest tube status post video assisted thoracic surgery. EXAM: PORTABLE CHEST 1 VIEW COMPARISON:  September 16, 2020.  August 11, 2020. FINDINGS: Stable cardiomegaly. Right-sided chest tube is now noted which appears to cross the midline, presumably into the anterior mediastinum. No pneumothorax is noted. Right internal jugular catheter is noted with tip in expected position of cavoatrial junction. Minimal right basilar subsegmental atelectasis is noted. Left basilar atelectasis is noted with small left pleural effusion. Bony thorax is unremarkable. IMPRESSION: Right-sided chest tube is now noted which appears to cross midline,  presumably into the anterior mediastinum. No pneumothorax is noted. Left basilar atelectasis is noted with small left pleural effusion. Electronically Signed   By: Marijo Conception M.D.   On: 09/17/2020 13:19   (Echo, Carotid, EGD, Colonoscopy, ERCP)    Subjective: Patient seen and examined.  Daughter at the bedside.  Patient speaks limited English but she is able to communicate.  Denied any breathing problems.  Appetite not good.  She thinks she can manage at home.   Discharge Exam: Vitals:   09/26/20 0300 09/26/20 0812  BP: (!) 130/58 (!) 155/63  Pulse: 68   Resp: 14 (!) 21  Temp: 98.6 F (37 C) 98.1 F (36.7 C)  SpO2: 100% 99%   Vitals:   09/25/20 1821 09/25/20 1948 09/26/20 0300 09/26/20 0812  BP: (!) 159/67 123/67 (!) 130/58 (!) 155/63  Pulse: 80 76 68   Resp: (!) 21 16 14  (!) 21  Temp: 98.6 F (37 C) 98.6 F (37 C) 98.6 F (37 C) 98.1 F (36.7 C)  TempSrc: Oral Oral Oral Oral  SpO2: 100% 100% 100% 99%    General: Pt is alert, awake, not in acute distress Cardiovascular: RRR, S1/S2 +, no rubs, no gallops Respiratory: CTA bilaterally, no  wheezing, no rhonchi Patient has surgical incision which is dry and clean, chest tube insertion site nontender, sutures present. Abdominal: Soft, NT, ND, bowel sounds + Extremities: no edema, no cyanosis    The results of significant diagnostics from this hospitalization (including imaging, microbiology, ancillary and laboratory) are listed below for reference.     Microbiology: Recent Results (from the past 240 hour(s))  Resp Panel by RT-PCR (Flu A&B, Covid) Nasopharyngeal Swab     Status: None   Collection Time: 09/24/20 10:30 PM   Specimen: Nasopharyngeal Swab; Nasopharyngeal(NP) swabs in vial transport medium  Result Value Ref Range Status   SARS Coronavirus 2 by RT PCR NEGATIVE NEGATIVE Final    Comment: (NOTE) SARS-CoV-2 target nucleic acids are NOT DETECTED.  The SARS-CoV-2 RNA is generally detectable in upper  respiratory specimens during the acute phase of infection. The lowest concentration of SARS-CoV-2 viral copies this assay can detect is 138 copies/mL. A negative result does not preclude SARS-Cov-2 infection and should not be used as the sole basis for treatment or other patient management decisions. A negative result may occur with  improper specimen collection/handling, submission of specimen other than nasopharyngeal swab, presence of viral mutation(s) within the areas targeted by this assay, and inadequate number of viral copies(<138 copies/mL). A negative result must be combined with clinical observations, patient history, and epidemiological information. The expected result is Negative.  Fact Sheet for Patients:  EntrepreneurPulse.com.au  Fact Sheet for Healthcare Providers:  IncredibleEmployment.be  This test is no t yet approved or cleared by the [place] FDA and  has been authorized for detection and/or diagnosis of SARS-CoV-2 by FDA under an Emergency Use Authorization (EUA). This EUA will remain  in effect (meaning this test can be used) for the duration of the COVID-19 declaration under Section 564(b)(1) of the Act, 21 U.S.C.section 360bbb-3(b)(1), unless the authorization is terminated  or revoked sooner.       Influenza A by PCR NEGATIVE NEGATIVE Final   Influenza B by PCR NEGATIVE NEGATIVE Final    Comment: (NOTE) The Xpert Xpress SARS-CoV-2/FLU/RSV plus assay is intended as an aid in the diagnosis of influenza from Nasopharyngeal swab specimens and should not be used as a sole basis for treatment. Nasal washings and aspirates are unacceptable for Xpert Xpress SARS-CoV-2/FLU/RSV testing.  Fact Sheet for Patients: EntrepreneurPulse.com.au  Fact Sheet for Healthcare Providers: IncredibleEmployment.be  This test is not yet approved or cleared by the [place] FDA and has been  authorized for detection and/or diagnosis of SARS-CoV-2 by FDA under an Emergency Use Authorization (EUA). This EUA will remain in effect (meaning this test can be used) for the duration of the COVID-19 declaration under Section 564(b)(1) of the Act, 21 U.S.C. section 360bbb-3(b)(1), unless the authorization is terminated or revoked.  Performed at [facility], [place] [address] [place], [place] [address]      Labs: BNP (last 3 results) Recent Labs    06/26/20 0401 09/25/20 0211  BNP 68.9 84.6   Basic Metabolic Panel: Recent Labs  Lab 09/22/20 0043 09/24/20 1536 09/24/20 2109 09/25/20 0515 09/25/20 0750 09/26/20 0304  NA 130* 125*  --  132* 132* 133*  K 3.5 3.3*  --  3.4* 3.5 4.4  CL 93* 87*  --  94* 92* 96*  CO2 29 27  --  28 30 28   GLUCOSE 97 161*  --  101* 109* 93  BUN 12 14  --  9 12 14   CREATININE 0.93 1.12*  --  0.86 0.90 1.14*  CALCIUM 8.4* 8.8*  --  8.9 8.7* 8.8*  MG  --   --  1.7  --   --   --    Liver Function Tests: Recent Labs  Lab 09/24/20 1536  AST 16  ALT 7  ALKPHOS 33*  BILITOT 0.4  PROT 6.1*  ALBUMIN 3.1*   No results for input(s): LIPASE, AMYLASE in the last 168 hours. No results for input(s): AMMONIA in the last 168 hours. CBC: Recent Labs  Lab 09/22/20 0043 09/24/20 1536 09/25/20 0515  WBC 6.5 6.2 5.7  NEUTROABS  --  4.1  --   HGB 9.6* 10.3* 10.2*  HCT 28.9* 30.9* 30.6*  MCV 80.1 79.4* 80.5  PLT 327 399 368   Cardiac Enzymes: No results for input(s): CKTOTAL, CKMB, CKMBINDEX, TROPONINI in the last 168 hours. BNP: Invalid input(s): POCBNP CBG: Recent Labs  Lab 09/25/20 1213 09/25/20 1842 09/25/20 1945 09/26/20 0805 09/26/20 1131  GLUCAP 176* 139* 114* 106* 128*   D-Dimer No results for input(s): DDIMER in the last 72 hours. Hgb A1c No results for input(s): HGBA1C in the last 72 hours. Lipid Profile No results for input(s): CHOL, HDL, LDLCALC, TRIG, CHOLHDL, LDLDIRECT in the last 72 hours. Thyroid function  studies No results for input(s): TSH, T4TOTAL, T3FREE, THYROIDAB in the last 72 hours.  Invalid input(s): FREET3 Anemia work up No results for input(s): VITAMINB12, FOLATE, FERRITIN, TIBC, IRON, RETICCTPCT in the last 72 hours. Urinalysis    Component Value Date/Time   COLORURINE YELLOW 09/24/2020 1536   APPEARANCEUR HAZY (A) 09/24/2020 1536   LABSPEC 1.016 09/24/2020 1536   PHURINE 5.0 09/24/2020 1536   GLUCOSEU NEGATIVE 09/24/2020 1536   HGBUR NEGATIVE 09/24/2020 1536   BILIRUBINUR NEGATIVE 09/24/2020 1536   KETONESUR NEGATIVE 09/24/2020 1536   PROTEINUR NEGATIVE 09/24/2020 1536   NITRITE NEGATIVE 09/24/2020 1536   LEUKOCYTESUR TRACE (A) 09/24/2020 1536   Sepsis Labs Invalid input(s): PROCALCITONIN,  WBC,  LACTICIDVEN Microbiology Recent Results (from the past 240 hour(s))  Resp Panel by RT-PCR (Flu A&B, Covid) Nasopharyngeal Swab     Status: None   Collection Time: 09/24/20 10:30 PM   Specimen: Nasopharyngeal Swab; Nasopharyngeal(NP) swabs in vial transport medium  Result Value Ref Range Status   SARS Coronavirus 2 by RT PCR NEGATIVE NEGATIVE Final    Comment: (NOTE) SARS-CoV-2 target nucleic acids are NOT DETECTED.  The SARS-CoV-2 RNA is generally detectable in upper respiratory specimens during the acute phase of infection. The lowest concentration of SARS-CoV-2 viral copies this assay can detect is 138 copies/mL. A negative result does not preclude SARS-Cov-2 infection and should not be used as the sole basis for treatment or other patient management decisions. A negative result may occur with  improper specimen collection/handling, submission of specimen other than nasopharyngeal swab, presence of viral mutation(s) within the areas targeted by this assay, and inadequate number of viral copies(<138 copies/mL). A negative result must be combined with clinical observations, patient history, and epidemiological information. The expected result is Negative.  Fact Sheet  for Patients:  EntrepreneurPulse.com.au  Fact Sheet for Healthcare Providers:  IncredibleEmployment.be  This test is no t yet approved or cleared by the [place] FDA and  has been authorized for detection and/or diagnosis of SARS-CoV-2 by FDA under an Emergency Use Authorization (EUA). This EUA will remain  in effect (meaning this test can be used) for the duration of the COVID-19 declaration under Section 564(b)(1) of the Act, 21 U.S.C.section 360bbb-3(b)(1), unless the authorization  is terminated  or revoked sooner.       Influenza A by PCR NEGATIVE NEGATIVE Final   Influenza B by PCR NEGATIVE NEGATIVE Final    Comment: (NOTE) The Xpert Xpress SARS-CoV-2/FLU/RSV plus assay is intended as an aid in the diagnosis of influenza from Nasopharyngeal swab specimens and should not be used as a sole basis for treatment. Nasal washings and aspirates are unacceptable for Xpert Xpress SARS-CoV-2/FLU/RSV testing.  Fact Sheet for Patients: EntrepreneurPulse.com.au  Fact Sheet for Healthcare Providers: IncredibleEmployment.be  This test is not yet approved or cleared by the [place] FDA and has been authorized for detection and/or diagnosis of SARS-CoV-2 by FDA under an Emergency Use Authorization (EUA). This EUA will remain in effect (meaning this test can be used) for the duration of the COVID-19 declaration under Section 564(b)(1) of the Act, 21 U.S.C. section 360bbb-3(b)(1), unless the authorization is terminated or revoked.  Performed at [facility], [place] [address], [place], [place] [address]      Time coordinating discharge:  32 minutes  SIGNED:   Barb Merino, MD  [facility] 09/26/2020, 1:50 PM

## 2020-09-26 NOTE — Progress Notes (Signed)
Physical Therapy Evaluation Patient Details Name: Mandy [name] MRN: [id number] DOB: Oct 29, 1940 Today's Date: 09/26/2020  History of Present Illness  Mandy [name] is a 80 y.o. female admitted 9/22 with weakness, nausea and fatigue as well as SOB. Of note, pt underwent surgery for anterior mediastinal mass  last week and was discharged 3 days ago.  PMH: diastolic dysfunction last EF measured was January 2021 was 15 to 55% with grade 1 diastolic dysfunction with history of diabetes mellitus, hypertension, hypothyroidism COVID June 2022 anterior mediastinal mass underwent surgery last week was discharged 3 days ago was feeling weak fatigue.  Nausea and poor appetite was brought to the ER.  Clinical Impression  Pt admitted with above diagnosis. Pt was able to ambulate to bathroom and back to bed. C/o dizziness hwoever VSS with sats >90% on RA.  Pt was able to clean herself once she urinated.  Daughter present and can assist pt at home. Pt does c/o pain .  Progressing.  Pt currently with functional limitations due to the deficits listed below (see PT Problem List). Pt will benefit from skilled PT to increase their independence and safety with mobility to allow discharge to the venue listed below.          Recommendations for follow up therapy are one component of a multi-disciplinary discharge planning process, led by the attending physician.  Recommendations may be updated based on patient status, additional functional criteria and insurance authorization.  Follow Up Recommendations Home health PT;Supervision for mobility/OOB    Equipment Recommendations  None recommended by PT    Recommendations for Other Services       Precautions / Restrictions Precautions Precautions: Fall Restrictions Weight Bearing Restrictions: No      Mobility  Bed Mobility Overal bed mobility: Needs Assistance Bed Mobility: Supine to Sit     Supine to sit: Min assist     General bed mobility comments: a  little assist to EOB    Transfers Overall transfer level: Needs assistance Equipment used: Rolling walker (2 wheeled) Transfers: Sit to/from Stand Sit to Stand: Min assist         General transfer comment: from EOB assist for balance  Ambulation/Gait Ambulation/Gait assistance: Min assist Gait Distance (Feet): 25 Feet Assistive device: Rolling walker (2 wheeled) Gait Pattern/deviations: Step-through pattern;Decreased stride length;Trunk flexed;Shuffle;Wide base of support   Gait velocity interpretation: <1.31 ft/sec, indicative of household ambulator General Gait Details: cues for walker proximity, keeps further out in front, cues and assist for sequencing steps and RW.  Educated daughter to assist pt at home and daughter agrees.  Stairs            Wheelchair Mobility    Modified Rankin (Stroke Patients Only)       Balance Overall balance assessment: Needs assistance Sitting-balance support: No upper extremity supported;Feet supported Sitting balance-Leahy Scale: Fair     Standing balance support: Bilateral upper extremity supported;During functional activity Standing balance-Leahy Scale: Poor Standing balance comment: UE support or external assist for balance                             Pertinent Vitals/Pain Pain Assessment: Faces Faces Pain Scale: Hurts even more Pain Location: R side at incision and L shoulder (chronic) Pain Descriptors / Indicators: Guarding;Aching Pain Intervention(s): Limited activity within patient's tolerance;Monitored during session;Repositioned    Home Living Family/patient expects to be discharged to:: Private residence Living Arrangements: Children Available Help at Discharge: Family;Available 24  hours/day Type of Home: House Home Access: Level entry     Home Layout: One level Home Equipment: Walker - 2 wheels      Prior Function Level of Independence: Independent with assistive device(s)                Hand Dominance   Dominant Hand: Right    Extremity/Trunk Assessment   Upper Extremity Assessment Upper Extremity Assessment: Defer to OT evaluation RUE Deficits / Details: AROM WFL, strength at least 3/5, lifting repetatively for breathing exercise LUE Deficits / Details: h/o pain in shoulder only lifts to about 95, grip WFL    Lower Extremity Assessment Lower Extremity Assessment: Generalized weakness    Cervical / Trunk Assessment Cervical / Trunk Assessment: Kyphotic  Communication   Communication: Prefers language other than [place];Interpreter utilized [place] - daughter translated today)  Cognition Arousal/Alertness: Awake/alert Behavior During Therapy: WFL for tasks assessed/performed Overall Cognitive Status: Difficult to assess                                 General Comments: seems WNL and daughter in room and feels baseline      General Comments General comments (skin integrity, edema, etc.): SpO2 90% and greater on RA with activity.    Exercises     Assessment/Plan    PT Assessment Patient needs continued PT services  PT Problem List Decreased strength;Decreased mobility;Decreased activity tolerance;Decreased balance;Decreased knowledge of use of DME;Cardiopulmonary status limiting activity;Decreased safety awareness       PT Treatment Interventions DME instruction;Therapeutic activities;Patient/family education;Therapeutic exercise;Gait training;Balance training;Functional mobility training    PT Goals (Current goals can be found in the Care Plan section)  Acute Rehab PT Goals Patient Stated Goal: to go home PT Goal Formulation: With patient/family Time For Goal Achievement: 10/10/20 Potential to Achieve Goals: Good    Frequency Min 3X/week   Barriers to discharge        Co-evaluation               AM-PAC PT "6 Clicks" Mobility  Outcome Measure Help needed turning from your back to your side while in a flat bed  without using bedrails?: A Little Help needed moving from lying on your back to sitting on the side of a flat bed without using bedrails?: A Little Help needed moving to and from a bed to a chair (including a wheelchair)?: A Little Help needed standing up from a chair using your arms (e.g., wheelchair or bedside chair)?: A Little Help needed to walk in hospital room?: A Little Help needed climbing 3-5 steps with a railing? : A Little 6 Click Score: 18    End of Session Equipment Utilized During Treatment: Gait belt Activity Tolerance: Patient limited by fatigue Patient left: in bed;with call bell/phone within reach;with family/visitor present (sitting EOB with daughter present - pt drinking her tea) Nurse Communication: Mobility status PT Visit Diagnosis: Muscle weakness (generalized) (M62.81)    Time: 0254-2706 PT Time Calculation (min) (ACUTE ONLY): 20 min   Charges:   PT Evaluation $PT Eval Moderate Complexity: 1 Mod          Adalid Beckmann M,PT Acute Rehab Services [phone] (pager)   Alvira Philips 09/26/2020, 1:26 PM

## 2020-09-26 NOTE — TOC Transition Note (Signed)
Transition of Care [facility]) - CM/SW Discharge Note   Patient Details  Name: Mandy [name] MRN: [id number] Date of Birth: [birth year]  [facility] CM/SW Contact:  Carles Collet, RN Phone Number: 09/26/2020, 2:21 PM   Clinical Narrative:    Patient active w [facility] prior to admission. Mofified order to include RN and OT as she was getting these services as well. Patient has needed ambulatory DME. Patient maintained RA sats w PT. No other CM needs identified.     Final next level of care: Home w Home Health Services Barriers to Discharge: No Barriers Identified   Patient Goals and CMS Choice        Discharge Placement                       Discharge Plan and Services                          HH Arranged: PT, OT, RN [facility] Agency: [place] Date [place]: 09/26/20 Time [place]: 1419 Representative spoke with at [place]: [place] ([place]) Interventions     Readmission Risk Interventions No flowsheet data found.

## 2020-09-28 ENCOUNTER — Other Ambulatory Visit: Payer: Self-pay | Admitting: Internal Medicine

## 2020-09-28 ENCOUNTER — Other Ambulatory Visit: Payer: Medicare HMO

## 2020-09-28 DIAGNOSIS — M810 Age-related osteoporosis without current pathological fracture: Secondary | ICD-10-CM

## 2020-10-01 ENCOUNTER — Encounter (HOSPITAL_COMMUNITY): Payer: Self-pay

## 2020-10-01 ENCOUNTER — Telehealth: Payer: Self-pay

## 2020-10-01 ENCOUNTER — Telehealth: Payer: Self-pay | Admitting: *Deleted

## 2020-10-01 ENCOUNTER — Inpatient Hospital Stay (HOSPITAL_COMMUNITY)
Admission: EM | Admit: 2020-10-01 | Discharge: 2020-10-09 | DRG: 291 | Disposition: A | Discharge status: Home Health | Payer: Medicare HMO | Attending: Internal Medicine | Admitting: Internal Medicine

## 2020-10-01 ENCOUNTER — Emergency Department (HOSPITAL_COMMUNITY): Payer: Medicare HMO

## 2020-10-01 DIAGNOSIS — N179 Acute kidney failure, unspecified: Secondary | ICD-10-CM | POA: Diagnosis present

## 2020-10-01 DIAGNOSIS — Z6834 Body mass index (BMI) 34.0-34.9, adult: Secondary | ICD-10-CM

## 2020-10-01 DIAGNOSIS — E785 Hyperlipidemia, unspecified: Secondary | ICD-10-CM | POA: Diagnosis present

## 2020-10-01 DIAGNOSIS — Z7983 Long term (current) use of bisphosphonates: Secondary | ICD-10-CM

## 2020-10-01 DIAGNOSIS — E119 Type 2 diabetes mellitus without complications: Secondary | ICD-10-CM | POA: Diagnosis present

## 2020-10-01 DIAGNOSIS — Z7982 Long term (current) use of aspirin: Secondary | ICD-10-CM

## 2020-10-01 DIAGNOSIS — J9601 Acute respiratory failure with hypoxia: Secondary | ICD-10-CM | POA: Diagnosis not present

## 2020-10-01 DIAGNOSIS — I10 Essential (primary) hypertension: Secondary | ICD-10-CM | POA: Diagnosis present

## 2020-10-01 DIAGNOSIS — E039 Hypothyroidism, unspecified: Secondary | ICD-10-CM | POA: Diagnosis present

## 2020-10-01 DIAGNOSIS — Z7984 Long term (current) use of oral hypoglycemic drugs: Secondary | ICD-10-CM

## 2020-10-01 DIAGNOSIS — D649 Anemia, unspecified: Secondary | ICD-10-CM

## 2020-10-01 DIAGNOSIS — M810 Age-related osteoporosis without current pathological fracture: Secondary | ICD-10-CM | POA: Diagnosis present

## 2020-10-01 DIAGNOSIS — E875 Hyperkalemia: Secondary | ICD-10-CM | POA: Diagnosis present

## 2020-10-01 DIAGNOSIS — J90 Pleural effusion, not elsewhere classified: Secondary | ICD-10-CM | POA: Diagnosis present

## 2020-10-01 DIAGNOSIS — D509 Iron deficiency anemia, unspecified: Secondary | ICD-10-CM | POA: Diagnosis present

## 2020-10-01 DIAGNOSIS — E0590 Thyrotoxicosis, unspecified without thyrotoxic crisis or storm: Secondary | ICD-10-CM | POA: Diagnosis present

## 2020-10-01 DIAGNOSIS — I5031 Acute diastolic (congestive) heart failure: Secondary | ICD-10-CM | POA: Diagnosis not present

## 2020-10-01 DIAGNOSIS — K219 Gastro-esophageal reflux disease without esophagitis: Secondary | ICD-10-CM | POA: Diagnosis present

## 2020-10-01 DIAGNOSIS — E669 Obesity, unspecified: Secondary | ICD-10-CM | POA: Diagnosis present

## 2020-10-01 DIAGNOSIS — I5032 Chronic diastolic (congestive) heart failure: Secondary | ICD-10-CM | POA: Diagnosis not present

## 2020-10-01 DIAGNOSIS — R531 Weakness: Secondary | ICD-10-CM

## 2020-10-01 DIAGNOSIS — J918 Pleural effusion in other conditions classified elsewhere: Secondary | ICD-10-CM | POA: Diagnosis present

## 2020-10-01 DIAGNOSIS — K760 Fatty (change of) liver, not elsewhere classified: Secondary | ICD-10-CM | POA: Diagnosis present

## 2020-10-01 DIAGNOSIS — I110 Hypertensive heart disease with heart failure: Secondary | ICD-10-CM | POA: Diagnosis present

## 2020-10-01 DIAGNOSIS — N1831 Chronic kidney disease, stage 3a: Secondary | ICD-10-CM

## 2020-10-01 DIAGNOSIS — R0600 Dyspnea, unspecified: Secondary | ICD-10-CM

## 2020-10-01 DIAGNOSIS — Z8616 Personal history of COVID-19: Secondary | ICD-10-CM

## 2020-10-01 DIAGNOSIS — R112 Nausea with vomiting, unspecified: Secondary | ICD-10-CM

## 2020-10-01 DIAGNOSIS — E871 Hypo-osmolality and hyponatremia: Secondary | ICD-10-CM | POA: Diagnosis present

## 2020-10-01 DIAGNOSIS — Z79899 Other long term (current) drug therapy: Secondary | ICD-10-CM | POA: Diagnosis not present

## 2020-10-01 DIAGNOSIS — I5033 Acute on chronic diastolic (congestive) heart failure: Secondary | ICD-10-CM | POA: Diagnosis present

## 2020-10-01 LAB — BASIC METABOLIC PANEL
Anion gap: 9 (ref 5–15)
BUN: 13 mg/dL (ref 8–23)
CO2: 27 mmol/L (ref 22–32)
Calcium: 9.2 mg/dL (ref 8.9–10.3)
Chloride: 83 mmol/L — ABNORMAL LOW (ref 98–111)
Creatinine, Ser: 1.2 mg/dL — ABNORMAL HIGH (ref 0.44–1.00)
GFR, Estimated: 46 mL/min — ABNORMAL LOW (ref >60)
Glucose, Bld: 134 mg/dL — ABNORMAL HIGH (ref 70–99)
Potassium: 5.6 mmol/L — ABNORMAL HIGH (ref 3.5–5.1)
Sodium: 119 mmol/L — CL (ref 135–145)

## 2020-10-01 LAB — IRON AND TIBC
Iron: 35 ug/dL (ref 28–170)
Saturation Ratios: 10 % — ABNORMAL LOW (ref 10.4–31.8)
TIBC: 357 ug/dL (ref 250–450)
UIBC: 322 ug/dL

## 2020-10-01 LAB — BLOOD GAS, ARTERIAL
Acid-Base Excess: 3.5 mmol/L — ABNORMAL HIGH (ref 0.0–2.0)
Bicarbonate: 29 mmol/L — ABNORMAL HIGH (ref 20.0–28.0)
Drawn by: 25788
FIO2: 36
O2 Content: 4 L/min
O2 Saturation: 91.4 %
Patient temperature: 97.4
pCO2 arterial: 50.2 mmHg — ABNORMAL HIGH (ref 32.0–48.0)
pH, Arterial: 7.376 (ref 7.350–7.450)
pO2, Arterial: 64.6 mmHg — ABNORMAL LOW (ref 83.0–108.0)

## 2020-10-01 LAB — CBC WITH DIFFERENTIAL/PLATELET
Abs Immature Granulocytes: 0.04 10*3/uL (ref 0.00–0.07)
Basophils Absolute: 0 10*3/uL (ref 0.0–0.1)
Basophils Relative: 0 %
Eosinophils Absolute: 0 10*3/uL (ref 0.0–0.5)
Eosinophils Relative: 0 %
HCT: 32.1 % — ABNORMAL LOW (ref 36.0–46.0)
Hemoglobin: 11 g/dL — ABNORMAL LOW (ref 12.0–15.0)
Immature Granulocytes: 1 %
Lymphocytes Relative: 11 %
Lymphs Abs: 0.8 10*3/uL (ref 0.7–4.0)
MCH: 26.8 pg (ref 26.0–34.0)
MCHC: 34.3 g/dL (ref 30.0–36.0)
MCV: 78.3 fL — ABNORMAL LOW (ref 80.0–100.0)
Monocytes Absolute: 0.4 10*3/uL (ref 0.1–1.0)
Monocytes Relative: 5 %
Neutro Abs: 5.8 10*3/uL (ref 1.7–7.7)
Neutrophils Relative %: 83 %
Platelets: 469 10*3/uL — ABNORMAL HIGH (ref 150–400)
RBC: 4.1 MIL/uL (ref 3.87–5.11)
RDW: 14.6 % (ref 11.5–15.5)
WBC: 7 10*3/uL (ref 4.0–10.5)
nRBC: 0 % (ref 0.0–0.2)

## 2020-10-01 LAB — HEPATIC FUNCTION PANEL
ALT: 7 U/L (ref 0–44)
AST: 33 U/L (ref 15–41)
Albumin: 4 g/dL (ref 3.5–5.0)
Alkaline Phosphatase: 42 U/L (ref 38–126)
Bilirubin, Direct: 0.5 mg/dL — ABNORMAL HIGH (ref 0.0–0.2)
Indirect Bilirubin: 0.4 mg/dL (ref 0.3–0.9)
Total Bilirubin: 0.9 mg/dL (ref 0.3–1.2)
Total Protein: 7.5 g/dL (ref 6.5–8.1)

## 2020-10-01 LAB — FERRITIN: Ferritin: 222 ng/mL (ref 11–307)

## 2020-10-01 LAB — LACTIC ACID, PLASMA: Lactic Acid, Venous: 0.8 mmol/L (ref 0.5–1.9)

## 2020-10-01 LAB — TSH: TSH: 2.273 u[IU]/mL (ref 0.350–4.500)

## 2020-10-01 LAB — GLUCOSE, CAPILLARY
Glucose-Capillary: 116 mg/dL — ABNORMAL HIGH (ref 70–99)
Glucose-Capillary: 138 mg/dL — ABNORMAL HIGH (ref 70–99)

## 2020-10-01 LAB — FOLATE: Folate: 28.8 ng/mL (ref >5.9)

## 2020-10-01 LAB — BRAIN NATRIURETIC PEPTIDE: B Natriuretic Peptide: 111.1 pg/mL — ABNORMAL HIGH (ref 0.0–100.0)

## 2020-10-01 LAB — VITAMIN B12: Vitamin B-12: 1689 pg/mL — ABNORMAL HIGH (ref 180–914)

## 2020-10-01 MED ORDER — LABETALOL HCL 5 MG/ML IV SOLN
10.0000 mg | INTRAVENOUS | Status: DC | PRN
  Administered 2020-10-02 (×2): 10 mg via INTRAVENOUS
  Filled 2020-10-01 (×2): qty 4

## 2020-10-01 MED ORDER — ACETAMINOPHEN 325 MG PO TABS
650.0000 mg | ORAL_TABLET | Freq: Four times a day (QID) | ORAL | Status: DC | PRN
  Administered 2020-10-03 – 2020-10-08 (×2): 650 mg via ORAL
  Filled 2020-10-01 (×2): qty 2

## 2020-10-01 MED ORDER — HYDRALAZINE HCL 25 MG PO TABS
25.0000 mg | ORAL_TABLET | ORAL | Status: DC | PRN
  Filled 2020-10-01: qty 1

## 2020-10-01 MED ORDER — MORPHINE SULFATE (PF) 2 MG/ML IV SOLN
2.0000 mg | INTRAVENOUS | Status: DC | PRN
  Administered 2020-10-01 – 2020-10-02 (×2): 2 mg via INTRAVENOUS
  Filled 2020-10-01 (×2): qty 1

## 2020-10-01 MED ORDER — INSULIN ASPART 100 UNIT/ML IJ SOLN
0.0000 [IU] | Freq: Every day | INTRAMUSCULAR | Status: DC
  Filled 2020-10-01: qty 0.05

## 2020-10-01 MED ORDER — ACETAMINOPHEN 650 MG RE SUPP: 650.0000 mg | Freq: Four times a day (QID) | RECTAL | Status: DC | PRN

## 2020-10-01 MED ORDER — SODIUM CHLORIDE 0.9% FLUSH
3.0000 mL | Freq: Two times a day (BID) | INTRAVENOUS | Status: DC
  Administered 2020-10-01 – 2020-10-09 (×14): 3 mL via INTRAVENOUS

## 2020-10-01 MED ORDER — INSULIN ASPART 100 UNIT/ML IJ SOLN
0.0000 [IU] | Freq: Three times a day (TID) | INTRAMUSCULAR | Status: DC
  Administered 2020-10-03: 5 [IU] via SUBCUTANEOUS
  Administered 2020-10-04 – 2020-10-05 (×3): 2 [IU] via SUBCUTANEOUS
  Administered 2020-10-05 – 2020-10-06 (×2): 3 [IU] via SUBCUTANEOUS
  Administered 2020-10-07: 2 [IU] via SUBCUTANEOUS
  Administered 2020-10-08: 3 [IU] via SUBCUTANEOUS
  Administered 2020-10-09: 2 [IU] via SUBCUTANEOUS
  Filled 2020-10-01: qty 0.15

## 2020-10-01 MED ORDER — ALBUTEROL SULFATE (2.5 MG/3ML) 0.083% IN NEBU: 2.5000 mg | INHALATION_SOLUTION | Freq: Four times a day (QID) | RESPIRATORY_TRACT | Status: DC | PRN

## 2020-10-01 MED ORDER — SODIUM CHLORIDE 0.9 % IV SOLN: INTRAVENOUS | Status: DC

## 2020-10-01 NOTE — Telephone Encounter (Signed)
Patient's daughter, [name] contacted the office with concerns about her moth's breathing, oxygen saturations, pain, and weakness. Patient is s/p RATs resection of mediastinal mass 09/17/20 with Dr. Kipp Brood. She has been admitted once since discharge from the hospital for shortness of breath, weakness, and oxygen saturation. She stated that her mother's o2 sats were 84% when checked, but does slowly come up at times. She also mentioned that she has been wheezing and she has given her Albuterol for, but she "continues to wheeze". She stated that she is also not sleeping at night.   After questions answered patient was laying flat most of the day and she has been holding her breath because she is hurting. She states that she is giving her mother pain medication as needed and Tylenol as well. Once patient sat up, took in big deep breaths, patient's daughter stated that her sats increased to 93%. Advised that she needed to sleep propped up on pillows at night and throughout the day when sitting as she does not have a recliner. Also stated that patient needed to use her incentive spirometer, which she has at home as often as possible.   After speaking with her daughter she states that her o2 sats did come up. Advised to give our office a call back with questions or concerns or call 911 and go to the nearest emergency room if her mom's sats begin to decline or stay low. She acknowledged receipt.

## 2020-10-01 NOTE — Progress Notes (Signed)
Stitch removed from R chest without difficulty.  Area dry and scabbed.

## 2020-10-01 NOTE — Assessment & Plan Note (Signed)
-   Patient has small bilateral pleural effusions on last hospitalization and underwent some diuresis; no recent echo, last from January 2021 (EF 50-55%, Gr 1 DD) -Albumin has also been recently low.  Third spacing may also be a factor -Obtaining updated echo - Follow-up BNP - Creatinine already mildly elevated, hold off on diuresis at this time - Thoracentesis ordered for diagnostic and therapeutic purposes -Continue oxygen and supportive care with morphine for air hunger

## 2020-10-01 NOTE — Telephone Encounter (Signed)
Patient's family member called to inform Dr. Kipp Brood that the patients O2 level is dropping into the 70s and it would slowly come back up.  Patient is SOB and dizzy.  Family member called EMS and with O2, her levels came back up.  EMS wanted to take her to the hospital but patient refused.  I spoke with the family member and advised her that she needs to go to the ER to be evaluated and hopefully she can get HH O2.  They both agreed and they plan to take her to [facility].  I will make Dr. Kipp Brood aware.

## 2020-10-01 NOTE — Assessment & Plan Note (Addendum)
-   Continue SSI and CBG monitoring -Last A1c 5.9% on 09/16/2020

## 2020-10-01 NOTE — Assessment & Plan Note (Signed)
-   Patient having ongoing difficulty recovering from recent hospitalizations - PT/OT evals while inpt

## 2020-10-01 NOTE — Assessment & Plan Note (Signed)
-   resume home meds once med rec complete - continue labetalol or hydralazine as needed for now

## 2020-10-01 NOTE — H&P (Signed)
History and Physical    Mandy [name]  VEL:[id number]  DOB: 14-Aug-1940  DOA: 10/01/2020  PCP: [name], MD Patient coming from: home  Chief Complaint: SOB  HPI:  Ms. Leflore is a 80 yo female with PMH DMII, HLD, HTN, GERD, hyperthyroidism, arthritis.  She was recently admitted 9/15 - 9/21 (underwent excision mediastinal mass) and readmitted 9/22 - 9/24 with SOB/hypoxia requiring lasix for diuresis of small B/L pleural effusions.   She has not been thriving well at home with ongoing significant weakness and shortness of breath.  Physical therapy came to her house yesterday and patient was unable to partake with therapy due to ongoing deconditioning and hypoxia with minimal exertion.  She therefore was brought to the ER by her daughter today for further evaluation. She initially required nonrebreather which was transitioned to 4 L nasal cannula.  She was found to have a moderate sized pleural effusion on the right side.  She is admitted for thoracentesis and further work-up.   I have personally briefly reviewed patient's old medical records in [facility] and discussed patient with the ER provider when appropriate/indicated.  Assessment/Plan: * Pleural effusion on right - Patient has small bilateral pleural effusions on last hospitalization and underwent some diuresis; no recent echo, last from January 2021 (EF 50-55%, Gr 1 DD) -Albumin has also been recently low.  Third spacing may also be a factor -Obtaining updated echo - Follow-up BNP - Creatinine already mildly elevated, hold off on diuresis at this time - Thoracentesis ordered for diagnostic and therapeutic purposes -Continue oxygen and supportive care with morphine for air hunger  AKI (acute kidney injury) ([place]) - baseline creatinine ~ 0.8 - patient presents with increase in creat >0.3 mg/dL above baseline, creat increase >1.5x baseline presumed to have occurred within past 7 days PTA - creat 1.2 on admission, BUN  13 - possibly ineffective diuresis with HCTZ at home in setting of hypoalbuminemia - hold off on IVF or lasix; await thoracentesis and echo results    Acute respiratory failure with hypoxia (HCC) - Likely due to right-sided pleural effusion - Continue O2 and wean as able  Microcytic anemia - baseline Hgb appears around 11-12 - check iron stores and folate/B12  Hyperkalemia - no EKG changes - hold off on treatment unless further elevation >5.7 - BMP in am   Chronic diastolic CHF (congestive heart failure) (HCC) - no LE edema but R effusion larger from prior - follow up BNP - check Echo   Hyperthyroidism - Continue home methimazole after med rec complete - Check TSH and thyroid hormones -Of note, pathology specimen from mediastinal mass excision noted to be benign thyroid tissue with nodular hyperplasia  Essential hypertension - resume home meds once med rec complete - continue labetalol or hydralazine as needed for now  Diabetes mellitus type 2, controlled ([place]) - Continue SSI and CBG monitoring -Last A1c 5.9% on 09/16/2020  Obesity (BMI 25-85.2) - Complicates overall prognosis and care - Body mass index is 34.42 kg/m.  Generalized weakness - Patient having ongoing difficulty recovering from recent hospitalizations - PT/OT evals while inpt  GERD - Will resume PPI  Dyslipidemia - Hold statin for now     Code Status: Full  DVT Prophylaxis: SCD     Anticipated disposition is to: home  History: Past Medical History:  Diagnosis Date   Aortic atherosclerosis ([place]) 10/08/2018   Arthritis    Diabetes mellitus without complication (HCC)    GERD (gastroesophageal reflux disease)  Hepatic steatosis 10/08/2018   Hyperlipidemia    Hypertension    Hyperthyroidism    Mass of right ovary 10/08/2018   Osteoporosis    Pneumonia    SBO (small bowel obstruction) ([place]) 08/09/2018   SBO (small bowel obstruction) ([place]) 08/09/2018    Past Surgical History:   Procedure Laterality Date   CATARACT EXTRACTION W/ INTRAOCULAR LENS  IMPLANT, BILATERAL     COLONOSCOPY       reports that she has never smoked. She has never used smokeless tobacco. She reports that she does not drink alcohol and does not use drugs.  No Known Allergies  Family History  Problem Relation Age of Onset   Kidney disease Son    Colon cancer Neg Hx    Breast cancer Neg Hx    Home Medications: Prior to Admission medications   Medication Sig Start Date End Date Taking? Authorizing Provider  albuterol (VENTOLIN HFA) 108 (90 Base) MCG/ACT inhaler Inhale 2 puffs into the lungs every 6 (six) hours as needed for wheezing or shortness of breath. 06/30/20   Thurnell Lose, MD  alendronate (FOSAMAX) 70 MG tablet Take 70 mg by mouth every Tuesday. Take with a full glass of water on an empty stomach.    [provider]  amLODipine (NORVASC) 10 MG tablet Take 10 mg by mouth daily.    [provider]  aspirin EC 81 MG tablet Take 81 mg by mouth daily. Swallow whole.    [provider]  atenolol (TENORMIN) 50 MG tablet Take 50 mg by mouth 2 (two) times daily.    [provider]  benazepril-hydrochlorthiazide (LOTENSIN HCT) 20-25 MG tablet Take 1 tablet by mouth daily. 01/08/19   [provider]  calcium carbonate (OSCAL) 1500 (600 Ca) MG TABS tablet Take 600 mg of elemental calcium by mouth 2 (two) times daily with a meal.    [provider]  diclofenac sodium (VOLTAREN) 1 % GEL Apply 4 g topically 4 (four) times daily as needed for pain. shoulder 09/13/18   [provider]  gabapentin (NEURONTIN) 100 MG capsule Take 100 mg by mouth at bedtime. 07/18/18   [provider]  guaiFENesin (MUCINEX) 600 MG 12 hr tablet Take 600 mg by mouth 2 (two) times daily as needed for cough.    [provider]  hydrALAZINE (APRESOLINE) 100 MG tablet Take 100 mg by mouth 3 (three) times daily. 09/04/20   [provider]   MELATONIN ER PO Take 2 tablets by mouth at bedtime as needed (sleep).    [provider]  metFORMIN (GLUCOPHAGE) 500 MG tablet Take 500 mg by mouth 2 (two) times daily with a meal.     [provider]  methimazole (TAPAZOLE) 5 MG tablet Take 0.5 tablets (2.5 mg total) by mouth daily. 02/04/19   Hosie Poisson, MD  Multiple Vitamin (MULTIVITAMIN WITH MINERALS) TABS tablet Take 1 tablet by mouth daily.    [provider]  pantoprazole (PROTONIX) 40 MG tablet Take 1 tablet (40 mg total) by mouth daily at 6 (six) AM. Patient taking differently: Take 40 mg by mouth daily. 06/30/20   Thurnell Lose, MD  simvastatin (ZOCOR) 20 MG tablet Take 1 tablet (20 mg total) by mouth at bedtime. 06/30/20   Thurnell Lose, MD  traMADol (ULTRAM) 50 MG tablet Take 1 tablet (50 mg total) by mouth every 6 (six) hours as needed (mild pain). Patient taking differently: Take 50 mg by mouth every 6 (six) hours  as needed for moderate pain (mild pain). 09/22/20   Gold, Wilder Glade, PA-C  vitamin B-12 (CYANOCOBALAMIN) 1000 MCG tablet Take 1,000 mcg by mouth daily.    [provider]  vitamin C (ASCORBIC ACID) 500 MG tablet Take 500 mg by mouth daily.    [provider]  Vitamin D, Cholecalciferol, 25 MCG (1000 UT) TABS Take 1,000 Units by mouth daily.    [provider]  zinc gluconate 50 MG tablet Take 50 mg by mouth daily.    [provider]    Review of Systems:  Pertinent items noted in HPI and remainder of comprehensive ROS otherwise negative.  Physical Exam: Vitals:   10/01/20 1626 10/01/20 1630 10/01/20 1645 10/01/20 1715  BP: (!) 152/120 (!) 185/72 (!) 169/91 (!) 118/99  Pulse: 64 73 71 72  Resp: 16 17 19  (!) 21  Temp:      TempSrc:      SpO2: 98% 100% 100% 99%  Weight:      Height:       General appearance:  chronically ill appearing elderly woman laying in bed in NAD but is uncomfortable Head: Normocephalic, without obvious abnormality,  atraumatic Eyes:  EOMI Lungs:  right sided crackles appreciated, no wheezing  Chest wall:  right chest wall suture in place with well healed incision  Heart: regular rate and rhythm and S1, S2 normal Abdomen: normal findings: bowel sounds normal and soft, non-tender Extremities:  no edema Skin: mobility and turgor normal Neurologic: Grossly normal  Labs on Admission:  I have personally reviewed following labs and imaging studies Results for orders placed or performed during the hospital encounter of 10/01/20 (from the past 24 hour(s))  CBC with Differential/Platelet     Status: Abnormal (Preliminary result)   Collection Time: 10/01/20  3:14 PM  Result Value Ref Range   WBC 7.0 4.0 - 10.5 K/uL   RBC 4.10 3.87 - 5.11 MIL/uL   Hemoglobin 11.0 (L) 12.0 - 15.0 g/dL   HCT 32.1 (L) 36.0 - 46.0 %   MCV 78.3 (L) 80.0 - 100.0 fL   MCH 26.8 26.0 - 34.0 pg   MCHC 34.3 30.0 - 36.0 g/dL   RDW 14.6 11.5 - 15.5 %   Platelets 469 (H) 150 - 400 K/uL   nRBC 0.0 0.0 - 0.2 %   Neutrophils Relative % PENDING %   Neutro Abs PENDING 1.7 - 7.7 K/uL   Band Neutrophils PENDING %   Lymphocytes Relative PENDING %   Lymphs Abs PENDING 0.7 - 4.0 K/uL   Monocytes Relative PENDING %   Monocytes Absolute PENDING 0.1 - 1.0 K/uL   Eosinophils Relative PENDING %   Eosinophils Absolute PENDING 0.0 - 0.5 K/uL   Basophils Relative PENDING %   Basophils Absolute PENDING 0.0 - 0.1 K/uL   WBC Morphology PENDING    RBC Morphology PENDING    Smear Review PENDING    Other PENDING %   nRBC PENDING 0 /100 WBC   Metamyelocytes Relative PENDING %   Myelocytes PENDING %   Promyelocytes Relative PENDING %   Blasts PENDING %   Immature Granulocytes PENDING %   Abs Immature Granulocytes PENDING 0.00 - 0.07 K/uL  Basic metabolic panel     Status: Abnormal   Collection Time: 10/01/20  3:14 PM  Result Value Ref Range   Sodium 119 (LL) 135 - 145 mmol/L   Potassium 5.6 (H) 3.5 - 5.1 mmol/L   Chloride 83 (L) 98 - 111  mmol/L  CO2 27 22 - 32 mmol/L   Glucose, Bld 134 (H) 70 - 99 mg/dL   BUN 13 8 - 23 mg/dL   Creatinine, Ser 1.20 (H) 0.44 - 1.00 mg/dL   Calcium 9.2 8.9 - 10.3 mg/dL   GFR, Estimated 46 (L) >60 mL/min   Anion gap 9 5 - 15  Brain natriuretic peptide     Status: Abnormal   Collection Time: 10/01/20  3:14 PM  Result Value Ref Range   B Natriuretic Peptide 111.1 (H) 0.0 - 100.0 pg/mL  Lactic acid, plasma     Status: None   Collection Time: 10/01/20  3:14 PM  Result Value Ref Range   Lactic Acid, Venous 0.8 0.5 - 1.9 mmol/L  Blood gas, arterial     Status: Abnormal   Collection Time: 10/01/20  3:22 PM  Result Value Ref Range   FIO2 36.00    O2 Content 4.0 L/min   pH, Arterial 7.376 7.350 - 7.450   pCO2 arterial 50.2 (H) 32.0 - 48.0 mmHg   pO2, Arterial 64.6 (L) 83.0 - 108.0 mmHg   Bicarbonate 29.0 (H) 20.0 - 28.0 mmol/L   Acid-Base Excess 3.5 (H) 0.0 - 2.0 mmol/L   O2 Saturation 91.4 %   Patient temperature 97.4    Collection site LEFT RADIAL    Drawn by [id number]    Allens test (pass/fail) PASS PASS     Radiological Exams on Admission: DG Chest Portable 1 View  Result Date: 10/01/2020 CLINICAL DATA:  Shortness of breath. EXAM: PORTABLE CHEST 1 VIEW COMPARISON:  CT angiogram chest 09/24/2020.  Chest x-ray 09/24/2020. FINDINGS: The cardiac silhouette appears enlarged and unchanged. There is central pulmonary vascular congestion. There are small bilateral pleural effusions, right greater than left. Pleural effusion on the right has increased. There is no pneumothorax. There are degenerative changes of the shoulders. No fractures are identified. IMPRESSION: 1. Cardiomegaly with central pulmonary vascular congestion and pleural effusions. Right-sided pleural effusion has increased. Electronically Signed   By: Ronney Asters M.D.   On: 10/01/2020 16:21   DG Chest Portable 1 View  Final Result    US THORACENTESIS ASP PLEURAL SPACE W/IMG GUIDE    (Results Pending)    Consults called:      EKG: Independently reviewed. NSR   Dwyane Dee, MD [facility] 10/01/2020, 5:28 PM

## 2020-10-01 NOTE — Assessment & Plan Note (Signed)
-   Hold statin for now

## 2020-10-01 NOTE — Assessment & Plan Note (Signed)
-   Complicates overall prognosis and care - Body mass index is 34.42 kg/m.

## 2020-10-01 NOTE — Assessment & Plan Note (Signed)
-   baseline Hgb appears around 11-12 - check iron stores and folate/B12

## 2020-10-01 NOTE — Assessment & Plan Note (Signed)
-   no EKG changes - hold off on treatment unless further elevation >5.7 - BMP in am

## 2020-10-01 NOTE — Assessment & Plan Note (Signed)
-   Likely due to right-sided pleural effusion - Continue O2 and wean as able

## 2020-10-01 NOTE — Assessment & Plan Note (Addendum)
-   Continue home methimazole after med rec complete - Check TSH and thyroid hormones -Of note, pathology specimen from mediastinal mass excision noted to be benign thyroid tissue with nodular hyperplasia

## 2020-10-01 NOTE — ED Provider Notes (Signed)
[facility] DEPT Provider Note   CSN: [id number] Arrival date & time: 10/01/20  1424     History Chief Complaint  Patient presents with   Shortness of Breath    Mandy [name] is a 80 y.o. female.  80 year old female who presents short of breath times several days.  Has a history of mediastinal mass and was discharged from the hospital after surgery for this several days ago.  Patient's daughter states that she is had increasing cough but no fever.  No emesis noted.  Denies any chest pain.  Has had increased dyspnea on exertion.  EMS was called earlier today but patient refused to be transported to the hospital.  Due to worsening status she finally agreed      Past Medical History:  Diagnosis Date   Aortic atherosclerosis ([place]) 10/08/2018   Arthritis    Diabetes mellitus without complication ([place])    GERD (gastroesophageal reflux disease)    Hepatic steatosis 10/08/2018   Hyperlipidemia    Hypertension    Hyperthyroidism    Mass of right ovary 10/08/2018   Osteoporosis    Pneumonia    SBO (small bowel obstruction) ([place]) 08/09/2018   SBO (small bowel obstruction) ([place]) 08/09/2018    Patient Active Problem List   Diagnosis Date Noted   Acute respiratory failure with hypoxemia ([place]) 09/25/2020   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   Acute respiratory failure with hypoxia ([place]) 06/26/2020   Hypertensive urgency 06/26/2020   Hypokalemia 06/26/2020   Generalized weakness 06/26/2020   Dizziness 06/26/2020   Obesity (BMI 30-39.9) 06/26/2020   Hyperthyroidism 06/26/2020   Chronic diastolic CHF (congestive heart failure) ([place]) 06/26/2020   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 [place]) 04/07/2008   Dyslipidemia 04/07/2008   Essential hypertension 04/07/2008   SORE THROAT 04/07/2008   GERD 04/07/2008    Past Surgical History:  Procedure Laterality Date   CATARACT EXTRACTION W/ INTRAOCULAR LENS  IMPLANT, BILATERAL     COLONOSCOPY       OB History   No obstetric history on file.     Family History  Problem Relation Age of Onset   Kidney disease Son    Colon cancer Neg Hx    Breast cancer Neg Hx     Social History   Tobacco Use   Smoking status: Never   Smokeless tobacco: Never  Vaping Use   Vaping Use: Never used  Substance Use Topics   Alcohol use: No   Drug use: No    Home Medications Prior to Admission medications   Medication Sig Start Date End Date Taking? Authorizing Provider  albuterol (VENTOLIN HFA) 108 (90 Base) MCG/ACT inhaler Inhale 2 puffs into the lungs every 6 (six) hours as needed for wheezing or shortness of breath. 06/30/20   Thurnell Lose, MD  alendronate (FOSAMAX) 70 MG tablet Take 70 mg by mouth every Tuesday. Take with a full glass of water on an empty stomach.    [provider]  amLODipine (NORVASC) 10 MG tablet Take 10 mg by mouth daily.    [provider]  aspirin EC 81 MG tablet Take 81 mg by mouth daily.  Swallow whole.    [provider]  atenolol (TENORMIN) 50 MG tablet Take 50 mg by mouth 2 (two) times daily.    [provider]  benazepril-hydrochlorthiazide (LOTENSIN HCT) 20-25 MG tablet Take 1 tablet by mouth daily. 01/08/19   [provider]  calcium carbonate (OSCAL) 1500 (600 Ca) MG TABS tablet Take 600 mg of elemental calcium by mouth 2 (two) times daily with a meal.    [provider]  diclofenac sodium (VOLTAREN) 1 % GEL Apply 4 g topically 4 (four) times daily as needed for pain. shoulder 09/13/18   [provider]  gabapentin (NEURONTIN) 100 MG  capsule Take 100 mg by mouth at bedtime. 07/18/18   [provider]  guaiFENesin (MUCINEX) 600 MG 12 hr tablet Take 600 mg by mouth 2 (two) times daily as needed for cough.    [provider]  hydrALAZINE (APRESOLINE) 100 MG tablet Take 100 mg by mouth 3 (three) times daily. 09/04/20   [provider]  MELATONIN ER PO Take 2 tablets by mouth at bedtime as needed (sleep).    [provider]  metFORMIN (GLUCOPHAGE) 500 MG tablet Take 500 mg by mouth 2 (two) times daily with a meal.     [provider]  methimazole (TAPAZOLE) 5 MG tablet Take 0.5 tablets (2.5 mg total) by mouth daily. 02/04/19   Hosie Poisson, MD  Multiple Vitamin (MULTIVITAMIN WITH MINERALS) TABS tablet Take 1 tablet by mouth daily.    [provider]  pantoprazole (PROTONIX) 40 MG tablet Take 1 tablet (40 mg total) by mouth daily at 6 (six) AM. Patient taking differently: Take 40 mg by mouth daily. 06/30/20   Thurnell Lose, MD  simvastatin (ZOCOR) 20 MG tablet Take 1 tablet (20 mg total) by mouth at bedtime. 06/30/20   Thurnell Lose, MD  traMADol (ULTRAM) 50 MG tablet Take 1 tablet (50 mg total) by mouth every 6 (six) hours as needed (mild pain). Patient taking differently: Take 50 mg by mouth every 6 (six) hours as needed for moderate pain (mild pain). 09/22/20   Gold, Wilder Glade, PA-C  vitamin B-12 (CYANOCOBALAMIN) 1000 MCG tablet Take 1,000 mcg by mouth daily.    [provider]  vitamin C (ASCORBIC ACID) 500 MG tablet Take 500 mg by mouth daily.    [provider]  Vitamin D, Cholecalciferol, 25 MCG (1000 UT) TABS Take 1,000 Units by mouth daily.    [provider]  zinc gluconate 50 MG tablet Take 50 mg by mouth daily.    [provider]    Allergies    Patient has no known allergies.  Review of Systems   Review of Systems  All other systems reviewed and are negative.  Physical Exam Updated Vital Signs BP (!) 158/83   Pulse 67    Temp (!) 97.4 F (36.3 C) (Axillary)   Resp (!) 28   Ht 1.499 m (4\' 11" )   Wt 77.3 kg   SpO2 100%   BMI 34.42 kg/m   Physical Exam Vitals and nursing note reviewed.  Constitutional:      General: She is not in acute distress.    Appearance: Normal appearance. She is well-developed. She is not toxic-appearing.  HENT:     Head: Normocephalic and atraumatic.  Eyes:     General: Lids are normal.     Conjunctiva/sclera: Conjunctivae normal.     Pupils: Pupils are equal, round, and reactive to light.  Neck:  Thyroid: No thyroid mass.     Trachea: No tracheal deviation.  Cardiovascular:     Rate and Rhythm: Normal rate and regular rhythm.     Heart sounds: Normal heart sounds. No murmur heard.   No gallop.  Pulmonary:     Effort: Tachypnea, prolonged expiration and respiratory distress present.     Breath sounds: No stridor. Decreased breath sounds and wheezing present. No rhonchi or rales.  Abdominal:     General: There is no distension.     Palpations: Abdomen is soft.     Tenderness: There is no abdominal tenderness. There is no rebound.  Musculoskeletal:        General: No tenderness. Normal range of motion.     Cervical back: Normal range of motion and neck supple.  Skin:    General: Skin is warm and dry.     Findings: No abrasion or rash.  Neurological:     Mental Status: She is alert and oriented to person, place, and time. Mental status is at baseline.     GCS: GCS eye subscore is 4. GCS verbal subscore is 5. GCS motor subscore is 6.     Cranial Nerves: Cranial nerves are intact. No cranial nerve deficit.     Sensory: No sensory deficit.     Motor: Motor function is intact.  Psychiatric:        Attention and Perception: Attention normal.        Speech: Speech normal.        Behavior: Behavior normal.    ED Results / Procedures / Treatments   Labs (all labs ordered are listed, but only abnormal results are displayed) Labs Reviewed  CULTURE, BLOOD (ROUTINE X  2)  CULTURE, BLOOD (ROUTINE X 2)  CBC WITH DIFFERENTIAL/PLATELET  BASIC METABOLIC PANEL  BLOOD GAS, ARTERIAL  BRAIN NATRIURETIC PEPTIDE  LACTIC ACID, PLASMA    EKG EKG Interpretation  Date/Time:  Thursday October 01 2020 14:45:05 EDT Ventricular Rate:  70 PR Interval:  120 QRS Duration: 115 QT Interval:  443 QTC Calculation: 478 R Axis:   37 Text Interpretation: Sinus rhythm Probable left atrial enlargement Nonspecific intraventricular conduction delay Borderline T abnormalities, anterior leads Baseline wander in lead(s) II III aVF No significant change since last tracing Confirmed by Lacretia Leigh ([id number]) on 10/01/2020 3:24:50 PM  Radiology No results found.  Procedures Procedures   Medications Ordered in ED Medications  0.9 %  sodium chloride infusion (has no administration in time range)    ED Course  I have reviewed the triage vital signs and the nursing notes.  Pertinent labs & imaging results that were available during my care of the patient were reviewed by me and considered in my medical decision making (see chart for details).    MDM Rules/Calculators/A&P                           Patient able to wean down off her oxygen here.  Chest x-ray consistent with large pleural effusion and pulmonary vascular congestion.  Was admitted recently for same.  Will require readmission Final Clinical Impression(s) / ED Diagnoses Final diagnoses:  None    Rx / DC Orders ED Discharge Orders     None        Lacretia Leigh, MD 10/01/20 [phone]

## 2020-10-01 NOTE — Assessment & Plan Note (Signed)
-   baseline creatinine ~ 0.8 - patient presents with increase in creat >0.3 mg/dL above baseline, creat increase >1.5x baseline presumed to have occurred within past 7 days PTA - creat 1.2 on admission, BUN 13 - possibly ineffective diuresis with HCTZ at home in setting of hypoalbuminemia - hold off on IVF or lasix; await thoracentesis and echo results

## 2020-10-01 NOTE — Assessment & Plan Note (Signed)
-   no LE edema but R effusion larger from prior - follow up BNP - check Echo

## 2020-10-01 NOTE — Assessment & Plan Note (Signed)
-   Will resume PPI

## 2020-10-01 NOTE — ED Triage Notes (Signed)
Pt BIBA from home. Pt has had SHOB x several days, worsening today. Upon EMS arrival, pt satting in the 70s on RA. Pt placed on NRB and improved to 100%. Pt also c/o weakness

## 2020-10-01 NOTE — Progress Notes (Signed)
PCP on call was notified of the Lab Draws from 1514. Potassium 5.6;Sodium 119; BNP 111.1. Awaiting any new orders.

## 2020-10-01 NOTE — Hospital Course (Signed)
Ms. Kemnitz is a 80 yo female with PMH DMII, HLD, HTN, GERD, hyperthyroidism, arthritis.  She was recently admitted 9/15 - 9/21 (underwent excision mediastinal mass) and readmitted 9/22 - 9/24 with SOB/hypoxia requiring lasix for diuresis of small B/L pleural effusions.   She has not been thriving well at home with ongoing significant weakness and shortness of breath.  Physical therapy came to her house yesterday and patient was unable to partake with therapy due to ongoing deconditioning and hypoxia with minimal exertion.  She therefore was brought to the ER by her daughter today for further evaluation. She initially required nonrebreather which was transitioned to 4 L nasal cannula.  She was found to have a moderate sized pleural effusion on the right side.  She is admitted for thoracentesis and further work-up.

## 2020-10-02 ENCOUNTER — Ambulatory Visit: Payer: Self-pay | Admitting: Thoracic Surgery (Cardiothoracic Vascular Surgery)

## 2020-10-02 ENCOUNTER — Other Ambulatory Visit: Payer: Self-pay

## 2020-10-02 ENCOUNTER — Inpatient Hospital Stay (HOSPITAL_COMMUNITY): Payer: Medicare HMO

## 2020-10-02 DIAGNOSIS — J90 Pleural effusion, not elsewhere classified: Secondary | ICD-10-CM

## 2020-10-02 DIAGNOSIS — I5031 Acute diastolic (congestive) heart failure: Secondary | ICD-10-CM

## 2020-10-02 DIAGNOSIS — N179 Acute kidney failure, unspecified: Secondary | ICD-10-CM

## 2020-10-02 DIAGNOSIS — J9601 Acute respiratory failure with hypoxia: Secondary | ICD-10-CM | POA: Diagnosis not present

## 2020-10-02 DIAGNOSIS — I5032 Chronic diastolic (congestive) heart failure: Secondary | ICD-10-CM

## 2020-10-02 LAB — CBC WITH DIFFERENTIAL/PLATELET
Abs Immature Granulocytes: 0.05 10*3/uL (ref 0.00–0.07)
Basophils Absolute: 0 10*3/uL (ref 0.0–0.1)
Basophils Relative: 0 %
Eosinophils Absolute: 0.1 10*3/uL (ref 0.0–0.5)
Eosinophils Relative: 1 %
HCT: 28.8 % — ABNORMAL LOW (ref 36.0–46.0)
Hemoglobin: 9.8 g/dL — ABNORMAL LOW (ref 12.0–15.0)
Immature Granulocytes: 1 %
Lymphocytes Relative: 12 %
Lymphs Abs: 0.7 10*3/uL (ref 0.7–4.0)
MCH: 26.6 pg (ref 26.0–34.0)
MCHC: 34 g/dL (ref 30.0–36.0)
MCV: 78 fL — ABNORMAL LOW (ref 80.0–100.0)
Monocytes Absolute: 0.7 10*3/uL (ref 0.1–1.0)
Monocytes Relative: 12 %
Neutro Abs: 4.6 10*3/uL (ref 1.7–7.7)
Neutrophils Relative %: 74 %
Platelets: 398 10*3/uL (ref 150–400)
RBC: 3.69 MIL/uL — ABNORMAL LOW (ref 3.87–5.11)
RDW: 14.6 % (ref 11.5–15.5)
WBC: 6.1 10*3/uL (ref 4.0–10.5)
nRBC: 0 % (ref 0.0–0.2)

## 2020-10-02 LAB — COMPREHENSIVE METABOLIC PANEL
ALT: 7 U/L (ref 0–44)
AST: 16 U/L (ref 15–41)
Albumin: 3.5 g/dL (ref 3.5–5.0)
Alkaline Phosphatase: 38 U/L (ref 38–126)
Anion gap: 9 (ref 5–15)
BUN: 11 mg/dL (ref 8–23)
CO2: 28 mmol/L (ref 22–32)
Calcium: 8.7 mg/dL — ABNORMAL LOW (ref 8.9–10.3)
Chloride: 82 mmol/L — ABNORMAL LOW (ref 98–111)
Creatinine, Ser: 1.04 mg/dL — ABNORMAL HIGH (ref 0.44–1.00)
GFR, Estimated: 55 mL/min — ABNORMAL LOW (ref >60)
Glucose, Bld: 121 mg/dL — ABNORMAL HIGH (ref 70–99)
Potassium: 3.7 mmol/L (ref 3.5–5.1)
Sodium: 119 mmol/L — CL (ref 135–145)
Total Bilirubin: 0.5 mg/dL (ref 0.3–1.2)
Total Protein: 6.3 g/dL — ABNORMAL LOW (ref 6.5–8.1)

## 2020-10-02 LAB — BASIC METABOLIC PANEL
Anion gap: 14 (ref 5–15)
BUN: 9 mg/dL (ref 8–23)
CO2: 30 mmol/L (ref 22–32)
Calcium: 8.7 mg/dL — ABNORMAL LOW (ref 8.9–10.3)
Chloride: 83 mmol/L — ABNORMAL LOW (ref 98–111)
Creatinine, Ser: 0.94 mg/dL (ref 0.44–1.00)
GFR, Estimated: >60 mL/min (ref >60)
Glucose, Bld: 89 mg/dL (ref 70–99)
Potassium: 4.4 mmol/L (ref 3.5–5.1)
Sodium: 127 mmol/L — ABNORMAL LOW (ref 135–145)

## 2020-10-02 LAB — LACTATE DEHYDROGENASE: LDH: 144 U/L (ref 98–192)

## 2020-10-02 LAB — OSMOLALITY, URINE: Osmolality, Ur: 140 mOsm/kg — ABNORMAL LOW (ref 300–900)

## 2020-10-02 LAB — GLUCOSE, CAPILLARY
Glucose-Capillary: 118 mg/dL — ABNORMAL HIGH (ref 70–99)
Glucose-Capillary: 109 mg/dL — ABNORMAL HIGH (ref 70–99)
Glucose-Capillary: 103 mg/dL — ABNORMAL HIGH (ref 70–99)
Glucose-Capillary: 87 mg/dL (ref 70–99)

## 2020-10-02 LAB — ECHOCARDIOGRAM COMPLETE
Area-P 1/2: 3.17 cm2
Height: 59 in
S' Lateral: 2.3 cm
Weight: 2797.2 oz

## 2020-10-02 LAB — MAGNESIUM: Magnesium: 1.9 mg/dL (ref 1.7–2.4)

## 2020-10-02 LAB — OSMOLALITY: Osmolality: 252 mOsm/kg — ABNORMAL LOW (ref 275–295)

## 2020-10-02 LAB — T4, FREE: Free T4: 1.03 ng/dL (ref 0.61–1.12)

## 2020-10-02 LAB — SODIUM, URINE, RANDOM: Sodium, Ur: <10 mmol/L

## 2020-10-02 LAB — SARS CORONAVIRUS 2 (TAT 6-24 HRS): SARS Coronavirus 2: NEGATIVE

## 2020-10-02 MED ORDER — AMLODIPINE BESYLATE 10 MG PO TABS
10.0000 mg | ORAL_TABLET | Freq: Every day | ORAL | Status: DC
  Administered 2020-10-03 – 2020-10-09 (×7): 10 mg via ORAL
  Filled 2020-10-02 (×8): qty 1

## 2020-10-02 MED ORDER — TRAMADOL HCL 50 MG PO TABS
50.0000 mg | ORAL_TABLET | Freq: Four times a day (QID) | ORAL | Status: DC | PRN
Start: 2020-10-02 — End: 2020-10-09
  Administered 2020-10-03 – 2020-10-07 (×6): 50 mg via ORAL
  Filled 2020-10-02 (×7): qty 1

## 2020-10-02 MED ORDER — ATENOLOL 50 MG PO TABS
50.0000 mg | ORAL_TABLET | Freq: Two times a day (BID) | ORAL | Status: DC
  Administered 2020-10-03 – 2020-10-09 (×14): 50 mg via ORAL
  Filled 2020-10-02 (×15): qty 1

## 2020-10-02 MED ORDER — PANTOPRAZOLE SODIUM 40 MG PO TBEC
40.0000 mg | DELAYED_RELEASE_TABLET | Freq: Every day | ORAL | Status: DC
  Administered 2020-10-03 – 2020-10-09 (×7): 40 mg via ORAL
  Filled 2020-10-02 (×7): qty 1

## 2020-10-02 MED ORDER — LIDOCAINE HCL 1 % IJ SOLN
INTRAMUSCULAR | Status: AC
  Filled 2020-10-02: qty 20

## 2020-10-02 MED ORDER — DIPHENHYDRAMINE HCL 50 MG/ML IJ SOLN
6.2500 mg | Freq: Once | INTRAMUSCULAR | Status: AC
  Administered 2020-10-02: 6.5 mg via INTRAVENOUS
  Filled 2020-10-02: qty 1

## 2020-10-02 MED ORDER — FUROSEMIDE 10 MG/ML IJ SOLN
40.0000 mg | Freq: Once | INTRAMUSCULAR | Status: AC
  Administered 2020-10-02: 40 mg via INTRAVENOUS
  Filled 2020-10-02: qty 4

## 2020-10-02 MED ORDER — GABAPENTIN 100 MG PO CAPS
100.0000 mg | ORAL_CAPSULE | Freq: Every day | ORAL | Status: DC
  Administered 2020-10-03 – 2020-10-08 (×7): 100 mg via ORAL
  Filled 2020-10-02 (×7): qty 1

## 2020-10-02 MED ORDER — SIMVASTATIN 20 MG PO TABS
20.0000 mg | ORAL_TABLET | Freq: Every day | ORAL | Status: DC
  Administered 2020-10-03 – 2020-10-08 (×7): 20 mg via ORAL
  Filled 2020-10-02 (×7): qty 1

## 2020-10-02 MED ORDER — ASPIRIN EC 81 MG PO TBEC
81.0000 mg | DELAYED_RELEASE_TABLET | Freq: Every day | ORAL | Status: DC
  Administered 2020-10-03 – 2020-10-09 (×7): 81 mg via ORAL
  Filled 2020-10-02 (×8): qty 1

## 2020-10-02 MED ORDER — METHIMAZOLE 5 MG PO TABS
2.5000 mg | ORAL_TABLET | Freq: Every day | ORAL | Status: DC
  Administered 2020-10-03 – 2020-10-09 (×7): 2.5 mg via ORAL
  Filled 2020-10-02 (×8): qty 1

## 2020-10-02 NOTE — Plan of Care (Signed)
  Problem: Clinical Measurements: Goal: Diagnostic test results will improve Outcome: Adequate for Discharge   

## 2020-10-02 NOTE — Progress Notes (Signed)
Patient refused to take oral Hydralazine 25mg  For B/P 183/75 (MAP 107). PCP was notified.

## 2020-10-02 NOTE — Progress Notes (Signed)
PROGRESS NOTE  Mandy [name] IFO:[id number] DOB: 1940/08/14 DOA: 10/01/2020 PCP: [name], MD   LOS: 1 day   Brief Narrative / Interim history: 80 year old female with DM2, hyperlipidemia, hypertension, hypothyroidism, arthritis comes into the hospital with complaints of shortness of breath and weakness.  Apparently she was hospitalized just 2 weeks ago and underwent excision of a mediastinal mass.  She was readmitted a day after being discharged with shortness of breath and hypoxia requiring diuresis.  During the second hospitalization she had small bilateral pleural effusions.  She has not been doing well at home since discharge and has been weaker and weaker and more hypoxic.  She was brought to the ER initially requiring nonrebreather then transition to 4 L nasal cannula.  She was found to have a moderate right-sided pleural effusion on the right side.  Subjective / 24h Interval events: Doing okay this morning, no shortness of breath at rest  Assessment & Plan: Principal Problem Acute hypoxic respiratory failure due to right-sided pleural effusion -she has evidence of fluid overload with vasculature congestion on chest x-ray as well as a pleural effusion -Obtain a 2D echo -Give IV Lasix x1 and monitor closely sodium levels -Therapeutic and diagnosis thoracentesis pending  Active Problems Hyponatremia -significant, 119 on admission, suspect in the setting of fluid overload as she does appear hypervolemic.  Obtain urine sodium, urine osmolality as well as serum osmolality -Give Lasix x1 today and closely monitor sodium this afternoon  Acute on chronic diastolic CHF-fluid overloaded with chest congestion on the chest x-ray, trace edema and right-sided pleural effusion, give Lasix as above  Hyperthyroidism-TSH, T4, T3 unremarkable.  Continue  DM2-continue sliding scale  Essential hypertension-continue amlodipine, atenolol, hold hydralazine  Hyperlipidemia-continue  statin  Hyperkalemia-resolved  Acute kidney injury-borderline, creatinine back to normal  Scheduled Meds:  amLODipine  10 mg Oral Daily   aspirin EC  81 mg Oral Daily   atenolol  50 mg Oral BID   furosemide  40 mg Intravenous Once   gabapentin  100 mg Oral QHS   insulin aspart  0-15 Units Subcutaneous TID WC   insulin aspart  0-5 Units Subcutaneous QHS   methimazole  2.5 mg Oral Daily   pantoprazole  40 mg Oral Q0600   simvastatin  20 mg Oral QHS   sodium chloride flush  3 mL Intravenous Q12H   Continuous Infusions:  sodium chloride Stopped (10/01/20 1521)   PRN Meds:.acetaminophen **OR** acetaminophen, albuterol, hydrALAZINE, labetalol, morphine injection  Diet Orders (From admission, onward)     Start     Ordered   10/02/20 0001  Diet NPO time specified  Diet effective midnight        10/01/20 1836            DVT prophylaxis: SCDs Start: 10/01/20 1837     Code Status: Full Code  Family Communication: Discussed with daughter over the phone  Status is: Inpatient  Remains inpatient appropriate because:Inpatient level of care appropriate due to severity of illness  Dispo: The patient is from: Home              Anticipated d/c is to: Home              Patient currently is not medically stable to d/c.   Difficult to place patient No  Level of care: Telemetry  Consultants:  None  Procedures:  2D echo: pending Thoracentesis-pending  Microbiology  none  Antimicrobials: none    Objective: Vitals:   10/01/20 2111 10/02/20 0137  10/02/20 0416 10/02/20 0531  BP: (!) 199/66 (!) 183/75 (!) 166/83 106/78  Pulse: 72 81 72 75  Resp: 18 20 16    Temp: 98.7 F (37.1 C) 97.9 F (36.6 C) 98.6 F (37 C)   TempSrc: Oral Oral Oral   SpO2: 96% 96% 96%   Weight:      Height:        Intake/Output Summary (Last 24 hours) at 10/02/2020 1036 Last data filed at 10/01/2020 2330 Gross per 24 hour  Intake --  Output 100 ml  Net -100 ml   Filed Weights   10/01/20  1436 10/01/20 1827  Weight: 77.3 kg 79.3 kg    Examination:  Constitutional: NAD Eyes: no scleral icterus ENMT: Mucous membranes are moist.  Neck: normal, supple Respiratory: Diminished at the bases, no wheezing, faint crackles heard. Cardiovascular: Regular rate and rhythm, no murmurs / rubs / gallops.  Trace edema Abdomen: non distended, no tenderness. Bowel sounds positive.  Musculoskeletal: no clubbing / cyanosis.  Skin: no rashes Neurologic: CN 2-12 grossly intact. Strength 5/5 in all 4.   Data Reviewed: I have independently reviewed following labs and imaging studies   CBC: Recent Labs  Lab 10/01/20 1514 10/02/20 0402  WBC 7.0 6.1  NEUTROABS 5.8 4.6  HGB 11.0* 9.8*  HCT 32.1* 28.8*  MCV 78.3* 78.0*  PLT 469* 657   Basic Metabolic Panel: Recent Labs  Lab 09/26/20 0304 10/01/20 1514 10/02/20 0402  NA 133* 119* 119*  K 4.4 5.6* 3.7  CL 96* 83* 82*  CO2 28 27 28   GLUCOSE 93 134* 121*  BUN 14 13 11   CREATININE 1.14* 1.20* 1.04*  CALCIUM 8.8* 9.2 8.7*  MG  --   --  1.9   Liver Function Tests: Recent Labs  Lab 10/01/20 1514 10/02/20 0402  AST 33 16  ALT 7 7  ALKPHOS 42 38  BILITOT 0.9 0.5  PROT 7.5 6.3*  ALBUMIN 4.0 3.5   Coagulation Profile: No results for input(s): INR, PROTIME in the last 168 hours. HbA1C: No results for input(s): HGBA1C in the last 72 hours. CBG: Recent Labs  Lab 09/26/20 0805 09/26/20 1131 10/01/20 1854 10/01/20 2108 10/02/20 0736  GLUCAP 106* 128* 116* 138* 118*    Recent Results (from the past 240 hour(s))  Resp Panel by RT-PCR (Flu A&B, Covid) Nasopharyngeal Swab     Status: None   Collection Time: 09/24/20 10:30 PM   Specimen: Nasopharyngeal Swab; Nasopharyngeal(NP) swabs in vial transport medium  Result Value Ref Range Status   SARS Coronavirus 2 by RT PCR NEGATIVE NEGATIVE Final    Comment: (NOTE) SARS-CoV-2 target nucleic acids are NOT DETECTED.  The SARS-CoV-2 RNA is generally detectable in upper  respiratory specimens during the acute phase of infection. The lowest concentration of SARS-CoV-2 viral copies this assay can detect is 138 copies/mL. A negative result does not preclude SARS-Cov-2 infection and should not be used as the sole basis for treatment or other patient management decisions. A negative result may occur with  improper specimen collection/handling, submission of specimen other than nasopharyngeal swab, presence of viral mutation(s) within the areas targeted by this assay, and inadequate number of viral copies(<138 copies/mL). A negative result must be combined with clinical observations, patient history, and epidemiological information. The expected result is Negative.  Fact Sheet for Patients:  EntrepreneurPulse.com.au  Fact Sheet for Healthcare Providers:  IncredibleEmployment.be  This test is no t yet approved or cleared by the [place] and  has been authorized  for detection and/or diagnosis of SARS-CoV-2 by FDA under an Emergency Use Authorization (EUA). This EUA will remain  in effect (meaning this test can be used) for the duration of the COVID-19 declaration under Section 564(b)(1) of the Act, 21 U.S.C.section 360bbb-3(b)(1), unless the authorization is terminated  or revoked sooner.       Influenza A by PCR NEGATIVE NEGATIVE Final   Influenza B by PCR NEGATIVE NEGATIVE Final    Comment: (NOTE) The Xpert Xpress SARS-CoV-2/FLU/RSV plus assay is intended as an aid in the diagnosis of influenza from Nasopharyngeal swab specimens and should not be used as a sole basis for treatment. Nasal washings and aspirates are unacceptable for Xpert Xpress SARS-CoV-2/FLU/RSV testing.  Fact Sheet for Patients: EntrepreneurPulse.com.au  Fact Sheet for Healthcare Providers: IncredibleEmployment.be  This test is not yet approved or cleared by the [place] FDA and has been  authorized for detection and/or diagnosis of SARS-CoV-2 by FDA under an Emergency Use Authorization (EUA). This EUA will remain in effect (meaning this test can be used) for the duration of the COVID-19 declaration under Section 564(b)(1) of the Act, 21 U.S.C. section 360bbb-3(b)(1), unless the authorization is terminated or revoked.  Performed at [facility], [place] [address], [place], [place] [address]   Culture, blood (Routine X 2) w Reflex to ID Panel     Status: None (Preliminary result)   Collection Time: 10/01/20  3:16 PM   Specimen: BLOOD  Result Value Ref Range Status   Specimen Description   Final    BLOOD RIGHT ANTECUBITAL Performed at [facility] [address] [place], [place] [address]    Special Requests   Final    BOTTLES DRAWN AEROBIC AND ANAEROBIC Blood Culture adequate volume Performed at [facility] [address], [place], [place] [address]    Culture   Final    NO GROWTH < 24 HOURS Performed at [facility] [address] [place], [place] [address]    Report Status PENDING  Incomplete  SARS CORONAVIRUS 2 (TAT 6-24 HRS) Nasopharyngeal Nasopharyngeal Swab     Status: None   Collection Time: 10/01/20  3:17 PM   Specimen: Nasopharyngeal Swab  Result Value Ref Range Status   SARS Coronavirus 2 NEGATIVE NEGATIVE Final    Comment: (NOTE) SARS-CoV-2 target nucleic acids are NOT DETECTED.  The SARS-CoV-2 RNA is generally detectable in upper and lower respiratory specimens during the acute phase of infection. Negative results do not preclude SARS-CoV-2 infection, do not rule out co-infections with other pathogens, and should not be used as the sole basis for treatment or other patient management decisions. Negative results must be combined with clinical observations, patient history, and epidemiological information. The expected result is Negative.  Fact Sheet for  Patients: SugarRoll.be  Fact Sheet for Healthcare Providers: https://www.woods-mathews.com/  This test is not yet approved or cleared by the [place] FDA and  has been authorized for detection and/or diagnosis of SARS-CoV-2 by FDA under an Emergency Use Authorization (EUA). This EUA will remain  in effect (meaning this test can be used) for the duration of the COVID-19 declaration under Se ction 564(b)(1) of the Act, 21 U.S.C. section 360bbb-3(b)(1), unless the authorization is terminated or revoked sooner.  Performed at [facility], [place] [address], [place], [place] [address]   Culture, blood (Routine X 2) w Reflex to ID Panel     Status: None (Preliminary result)   Collection Time: 10/01/20  7:14  PM   Specimen: BLOOD  Result Value Ref Range Status   Specimen Description   Final    BLOOD LEFT ANTECUBITAL Performed at [facility] [address] [place], [place] [address]    Special Requests   Final    BOTTLES DRAWN AEROBIC ONLY Blood Culture results may not be optimal due to an inadequate volume of blood received in culture bottles Performed at [facility] [address], [place], [place] [address]    Culture   Final    NO GROWTH < 12 HOURS Performed at [facility] [address], [place], [place] [address]    Report Status PENDING  Incomplete     Radiology Studies: DG Chest Portable 1 View  Result Date: 10/01/2020 CLINICAL DATA:  Shortness of breath. EXAM: PORTABLE CHEST 1 VIEW COMPARISON:  CT angiogram chest 09/24/2020.  Chest x-ray 09/24/2020. FINDINGS: The cardiac silhouette appears enlarged and unchanged. There is central pulmonary vascular congestion. There are small bilateral pleural effusions, right greater than left. Pleural effusion on the right has increased. There is no pneumothorax. There are degenerative changes of the shoulders. No fractures are identified.  IMPRESSION: 1. Cardiomegaly with central pulmonary vascular congestion and pleural effusions. Right-sided pleural effusion has increased. Electronically Signed   By: Ronney Asters M.D.   On: 10/01/2020 16:21    Marzetta Board, MD, PhD [facility]  Between 7 am - 7 pm I am available, please contact me via Amion (for emergencies) or Securechat (non urgent messages)  Between 7 pm - 7 am I am not available, please contact night coverage MD/APP via Amion

## 2020-10-02 NOTE — Progress Notes (Signed)
Sodium Level is 119. PCP was notified

## 2020-10-02 NOTE — Progress Notes (Signed)
PCP was notified to see if diet orders can be resumed.  BMP was drawn. Awaiting results.

## 2020-10-02 NOTE — Plan of Care (Signed)
  Problem: Clinical Measurements: Goal: Ability to maintain clinical measurements within normal limits will improve Outcome: Progressing Goal: Will remain free from infection Outcome: Progressing   

## 2020-10-02 NOTE — Progress Notes (Signed)
  Using [place] to identify pleural fluid for ordered thoracentesis, a very small pocket of fluid was identified on right side. The effusion deemed not large enough to tap safely. Pt daughter who is at the bedside was advised of the same and was in agreement. Pt was sent back to room. Clarise Cruz, primary RN and Dr. Cruzita Lederer were notified of decision not to perform thoracentesis.    Electronically Signed: Tyson Alias 10/02/2020, 4:51 PM

## 2020-10-02 NOTE — Progress Notes (Signed)
OT Cancellation Note  Patient Details Name: Zenobia Kuennen MRN: [id number] DOB: 03/16/1940   Cancelled Treatment:    Reason Eval/Treat Not Completed: Patient at procedure or test/ unavailable and requires family to translate. Will f/u as able.  Angalena Cousineau L Hazelene Doten 10/02/2020, 3:00 PM

## 2020-10-02 NOTE — Progress Notes (Signed)
PT Cancellation Note  Patient Details Name: Mandy [name] MRN: [id number] DOB: 09/17/1940   Cancelled Treatment:    Reason Eval/Treat Not Completed: Patient at procedure or test/unavailable  Jannette Spanner PT, DPT Acute Rehabilitation Services Pager: [phone] Office: [place] 10/02/2020, 1:11 PM

## 2020-10-02 NOTE — Progress Notes (Signed)
Echocardiogram 2D Echocardiogram has been performed.  [place] Deputy Shadi Larner RDCS 10/02/2020, 2:50 PM

## 2020-10-03 DIAGNOSIS — J9601 Acute respiratory failure with hypoxia: Secondary | ICD-10-CM | POA: Diagnosis not present

## 2020-10-03 DIAGNOSIS — J90 Pleural effusion, not elsewhere classified: Secondary | ICD-10-CM | POA: Diagnosis not present

## 2020-10-03 LAB — COMPREHENSIVE METABOLIC PANEL
ALT: 7 U/L (ref 0–44)
AST: 16 U/L (ref 15–41)
Albumin: 3.3 g/dL — ABNORMAL LOW (ref 3.5–5.0)
Alkaline Phosphatase: 38 U/L (ref 38–126)
Anion gap: 8 (ref 5–15)
BUN: 10 mg/dL (ref 8–23)
CO2: 34 mmol/L — ABNORMAL HIGH (ref 22–32)
Calcium: 9 mg/dL (ref 8.9–10.3)
Chloride: 91 mmol/L — ABNORMAL LOW (ref 98–111)
Creatinine, Ser: 0.77 mg/dL (ref 0.44–1.00)
GFR, Estimated: >60 mL/min (ref >60)
Glucose, Bld: 98 mg/dL (ref 70–99)
Potassium: 3.8 mmol/L (ref 3.5–5.1)
Sodium: 133 mmol/L — ABNORMAL LOW (ref 135–145)
Total Bilirubin: 0.4 mg/dL (ref 0.3–1.2)
Total Protein: 6.4 g/dL — ABNORMAL LOW (ref 6.5–8.1)

## 2020-10-03 LAB — CBC WITH DIFFERENTIAL/PLATELET
Abs Immature Granulocytes: 0.03 10*3/uL (ref 0.00–0.07)
Basophils Absolute: 0 10*3/uL (ref 0.0–0.1)
Basophils Relative: 0 %
Eosinophils Absolute: 0.1 10*3/uL (ref 0.0–0.5)
Eosinophils Relative: 1 %
HCT: 28.8 % — ABNORMAL LOW (ref 36.0–46.0)
Hemoglobin: 9.7 g/dL — ABNORMAL LOW (ref 12.0–15.0)
Immature Granulocytes: 1 %
Lymphocytes Relative: 21 %
Lymphs Abs: 1 10*3/uL (ref 0.7–4.0)
MCH: 26.9 pg (ref 26.0–34.0)
MCHC: 33.7 g/dL (ref 30.0–36.0)
MCV: 79.8 fL — ABNORMAL LOW (ref 80.0–100.0)
Monocytes Absolute: 0.8 10*3/uL (ref 0.1–1.0)
Monocytes Relative: 16 %
Neutro Abs: 3.1 10*3/uL (ref 1.7–7.7)
Neutrophils Relative %: 61 %
Platelets: 418 10*3/uL — ABNORMAL HIGH (ref 150–400)
RBC: 3.61 MIL/uL — ABNORMAL LOW (ref 3.87–5.11)
RDW: 14.8 % (ref 11.5–15.5)
WBC: 5.1 10*3/uL (ref 4.0–10.5)
nRBC: 0 % (ref 0.0–0.2)

## 2020-10-03 LAB — GLUCOSE, CAPILLARY
Glucose-Capillary: 104 mg/dL — ABNORMAL HIGH (ref 70–99)
Glucose-Capillary: 209 mg/dL — ABNORMAL HIGH (ref 70–99)
Glucose-Capillary: 84 mg/dL (ref 70–99)
Glucose-Capillary: 115 mg/dL — ABNORMAL HIGH (ref 70–99)

## 2020-10-03 LAB — MAGNESIUM: Magnesium: 1.8 mg/dL (ref 1.7–2.4)

## 2020-10-03 MED ORDER — POLYETHYLENE GLYCOL 3350 17 G PO PACK
17.0000 g | PACK | Freq: Every day | ORAL | Status: DC
  Administered 2020-10-03 – 2020-10-09 (×7): 17 g via ORAL
  Filled 2020-10-03 (×7): qty 1

## 2020-10-03 MED ORDER — SENNOSIDES-DOCUSATE SODIUM 8.6-50 MG PO TABS
1.0000 | ORAL_TABLET | Freq: Two times a day (BID) | ORAL | Status: DC
  Administered 2020-10-03 – 2020-10-09 (×13): 1 via ORAL
  Filled 2020-10-03 (×13): qty 1

## 2020-10-03 MED ORDER — FUROSEMIDE 10 MG/ML IJ SOLN
40.0000 mg | Freq: Every day | INTRAMUSCULAR | Status: DC
  Administered 2020-10-03 – 2020-10-06 (×4): 40 mg via INTRAVENOUS
  Filled 2020-10-03 (×4): qty 4

## 2020-10-03 NOTE — Evaluation (Signed)
Occupational Therapy Evaluation Patient Details Name: Mandy [name] MRN: [id number] DOB: Jul 25, 1940 Today's Date: 10/03/2020   History of Present Illness Mandy [name] is a 80 yo female presents due to not thriving well at home, ongoing weakness and SOB. 9/30 Thoracentesis not performed, effusion deemed not large enough to tap safely. She was recently admitted 9/15 - 9/21 (underwent excision mediastinal mass) and readmitted 9/22 - 9/24 with SOB/hypoxia requiring lasix for diuresis of small B/L pleural effusions. PMH: CHF, DMII, HLD, HTN, GERD, hyperthyroidism, arthritis, osteoporosis, diabetes   Clinical Impression   Patient is a 80 year old female who was admitted for above. Patient was living at home with daughter prior level. Currently, patient needs mod A for LB dressing with supplemental O2 for functional mobility to maintain O2 saturation. Patient was noted to require increased O2, have decreased functional activity tolerance, decreased endurance and decreased standing balance impacting patients participation in ADLs. Patient would continue to benefit from skilled OT services at this time while admitted and after d/c to address noted deficits in order to improve overall safety and independence in ADLs.        Recommendations for follow up therapy are one component of a multi-disciplinary discharge planning process, led by the attending physician.  Recommendations may be updated based on patient status, additional functional criteria and insurance authorization.   Follow Up Recommendations  Home health OT;Supervision/Assistance - 24 hour    Equipment Recommendations  None recommended by OT    Recommendations for Other Services       Precautions / Restrictions Precautions Precautions: Fall Precaution Comments: monitor O2 Restrictions Weight Bearing Restrictions: No      Mobility Bed Mobility Overal bed mobility: Needs Assistance Bed Mobility: Supine to Sit     Supine to  sit: Min guard     General bed mobility comments: slow movements on edge of bed with use of bed rail for upright positioning.    Transfers Overall transfer level: Needs assistance Equipment used: Rolling walker (2 wheeled) Transfers: Sit to/from Stand Sit to Stand: Min guard         General transfer comment: VCs for hand placement to power up and return to sitting, no physical assistance to power up    Balance Overall balance assessment: Needs assistance Sitting-balance support: Feet supported Sitting balance-Leahy Scale: Good Sitting balance - Comments: seated EOB   Standing balance support: During functional activity;Bilateral upper extremity supported Standing balance-Leahy Scale: Poor Standing balance comment: reliant on UE support                           ADL either performed or assessed with clinical judgement   ADL Overall ADL's : Needs assistance/impaired Eating/Feeding: Set up;Sitting   Grooming: Oral care;Wash/dry face;Set up;Sitting   Upper Body Bathing: Minimal assistance;Sitting Upper Body Bathing Details (indicate cue type and reason): patients daughter reported helping occasionally if UE was bothering her. pending shoulder replacement at this time Lower Body Bathing: Sitting/lateral leans;Moderate assistance   Upper Body Dressing : Minimal assistance;Sitting   Lower Body Dressing: Moderate assistance;Sitting/lateral leans   Toilet Transfer: RW;Minimal assistance;Ambulation;BSC Toilet Transfer Details (indicate cue type and reason): patient was able to transfer to recliner in room with RW with increased time and cues to keep walker close and slow down Toileting- Clothing Manipulation and Hygiene: Minimal assistance;Sit to/from stand       Functional mobility during ADLs: Minimal assistance;Rolling walker General ADL Comments: patient was noted to have O2  drop while on RA sitting EOB with need for 2L/min with functional mobility with RW on  this date. patient does not use O2 at home.     Vision   Vision Assessment?: No apparent visual deficits     Perception     Praxis      Pertinent Vitals/Pain Pain Assessment: Faces Faces Pain Scale: Hurts a little bit Pain Location: R side abdomen Pain Descriptors / Indicators: Sore;Guarding Pain Intervention(s): Limited activity within patient's tolerance;Monitored during session;Premedicated before session;Repositioned     Hand Dominance Right   Extremity/Trunk Assessment Upper Extremity Assessment Upper Extremity Assessment: RUE deficits/detail RUE Deficits / Details: AROM WFL, strength at least 3/5, lifting repetatively for breathing exercise   Lower Extremity Assessment Lower Extremity Assessment: Defer to PT evaluation   Cervical / Trunk Assessment Cervical / Trunk Assessment: Kyphotic   Communication Communication Communication: Prefers language other than [place];Interpreter utilized   Cognition Arousal/Alertness: Awake/alert Behavior During Therapy: WFL for tasks assessed/performed Overall Cognitive Status: Within Functional Limits for tasks assessed                                 General Comments: seems WNL and daughter in room and feels baseline   General Comments  Trialed RA and pt quickly desat to 87%, returned 2L and pt SpO2 >92%, VC for pursed lip breathing and decreased mouth breathing while ambulating    Exercises     Shoulder Instructions      Home Living Family/patient expects to be discharged to:: Private residence Living Arrangements: Children Available Help at Discharge: Family;Available 24 hours/day Type of Home: House Home Access: Level entry     Home Layout: One level     Bathroom Shower/Tub: Teacher, early years/pre: Standard     Home Equipment: Environmental consultant - 2 wheels   Additional Comments: daughter in the room and interpreting for pt      Prior Functioning/Environment Level of Independence:  Independent with assistive device(s)        Comments: Daughter reports using RW, pt independent with ADLs sometimes requires assist due to L shoulder pain.        OT Problem List: Decreased strength;Decreased activity tolerance;Impaired balance (sitting and/or standing);Decreased safety awareness;Impaired UE functional use      OT Treatment/Interventions: Self-care/ADL training;DME and/or AE instruction;Therapeutic activities;Balance training;Patient/family education    OT Goals(Current goals can be found in the care plan section) Acute Rehab OT Goals Patient Stated Goal: get stronger and return home with daughter to assist OT Goal Formulation: With patient/family Time For Goal Achievement: 10/17/20 Potential to Achieve Goals: Good  OT Frequency: Min 2X/week   Barriers to D/C:            Co-evaluation PT/OT/SLP Co-Evaluation/Treatment: Yes Reason for Co-Treatment: To address functional/ADL transfers PT goals addressed during session: Mobility/safety with mobility OT goals addressed during session: ADL's and self-care      AM-PAC OT "6 Clicks" Daily Activity     Outcome Measure Help from another person eating meals?: A Little Help from another person taking care of personal grooming?: A Little Help from another person toileting, which includes using toliet, bedpan, or urinal?: A Lot Help from another person bathing (including washing, rinsing, drying)?: A Lot Help from another person to put on and taking off regular upper body clothing?: A Little Help from another person to put on and taking off regular lower body clothing?: A Lot 6 Click  Score: 15   End of Session Equipment Utilized During Treatment: Gait belt;Rolling walker Nurse Communication: Mobility status;Other (comment) (need for continued O2)  Activity Tolerance: Patient tolerated treatment well Patient left: in chair;with call bell/phone within reach;with chair alarm set;with family/visitor present  OT Visit  Diagnosis: Unsteadiness on feet (R26.81);Muscle weakness (generalized) (M62.81)                Time: 6270-3500 OT Time Calculation (min): 31 min Charges:  OT General Charges $OT Visit: 1 Visit OT Evaluation $OT Eval Low Complexity: 1 Low  Leota Sauers, MS Acute Rehabilitation Department Office# [phone] Pager# [phone]   [place] 10/03/2020, 4:31 PM

## 2020-10-03 NOTE — Evaluation (Addendum)
Physical Therapy Evaluation Patient Details Name: Mandy [name] MRN: [id number] DOB: 11-Aug-1940 Today's Date: 10/03/2020  History of Present Illness  Mandy [name] is a 80 yo female presents due to not thriving well at home, ongoing weakness and SOB. 9/30 Thoracentesis not performed, effusion deemed not large enough to tap safely. She was recently admitted 9/15 - 9/21 (underwent excision mediastinal mass) and readmitted 9/22 - 9/24 with SOB/hypoxia requiring lasix for diuresis of small B/L pleural effusions. PMH: CHF, DMII, HLD, HTN, GERD, hyperthyroidism, arthritis, osteoporosis, diabetes   Clinical Impression  Pt admitted with above diagnosis. At baseline, daughter reports pt ambulates around the home with RW, able to toilet independently, occasionally requiring assist with bathing and dressing due to shoulder limitations, denies falls ever. Pt currently requiring min guard-min assist with mobility, 1 minor LOB requiring min A to recover while ambulating. Pt fatigue easily requiring seated rest break. Pt no normally on supplemental O2, currently on 2L O2 with SpO2 >92%, desats quickly to 87% on RA when seated EOB. Educated pt and daughter on time OOB, sitting up in recliner, ambulating to restroom with nursing and both verbalized agreement. Pt currently with functional limitations due to the deficits listed below (see PT Problem List). Pt will benefit from skilled PT to increase their independence and safety with mobility to allow discharge to the venue listed below.          Recommendations for follow up therapy are one component of a multi-disciplinary discharge planning process, led by the attending physician.  Recommendations may be updated based on patient status, additional functional criteria and insurance authorization.  Follow Up Recommendations Home health PT;Supervision for mobility/OOB    Equipment Recommendations  None recommended by PT    Recommendations for Other Services        Precautions / Restrictions Precautions Precautions: Fall Precaution Comments: monitor O2 Restrictions Weight Bearing Restrictions: No      Mobility  Bed Mobility Overal bed mobility: Needs Assistance Bed Mobility: Supine to Sit  Supine to sit: Min guard  General bed mobility comments: slow, labored movement to come to sitting EOB, use of bedrail to uprigh trunk and scoot out to EOB    Transfers Overall transfer level: Needs assistance Equipment used: Rolling walker (2 wheeled) Transfers: Sit to/from Stand Sit to Stand: Min guard  General transfer comment: VCs for hand placement to power up and return to sitting, no physical assistance to power up  Ambulation/Gait Ambulation/Gait assistance: Min assist;Min guard Gait Distance (Feet): 20 Feet (+ additional 40 ft) Assistive device: Rolling walker (2 wheeled) Gait Pattern/deviations: Step-through pattern;Decreased stride length Gait velocity: decreased   General Gait Details: VCs to maintain body position closer to RW, 1 minor LOB requiring min A to recover with turning, dyspnea 3/4 noted on 2L with SpO2 >92%  Stairs            Wheelchair Mobility    Modified Rankin (Stroke Patients Only)       Balance Overall balance assessment: Needs assistance Sitting-balance support: Feet supported Sitting balance-Leahy Scale: Good Sitting balance - Comments: seated EOB   Standing balance support: During functional activity;Bilateral upper extremity supported Standing balance-Leahy Scale: Poor Standing balance comment: reliant on UE support       Pertinent Vitals/Pain Pain Assessment: Faces Faces Pain Scale: Hurts a little bit Pain Location: R side abdomen Pain Descriptors / Indicators: Sore;Guarding Pain Intervention(s): Limited activity within patient's tolerance;Monitored during session;Premedicated before session;Repositioned    Home Living Family/patient expects to be discharged to::  Private residence Living  Arrangements: Children Available Help at Discharge: Family;Available 24 hours/day Type of Home: House Home Access: Level entry     Home Layout: One level Home Equipment: Walker - 2 wheels      Prior Function Level of Independence: Independent with assistive device(s)  Comments: Daughter reports using RW, pt independent with ADLs sometimes requires assist due to L shoulder pain.     Hand Dominance   Dominant Hand: Right    Extremity/Trunk Assessment   Upper Extremity Assessment Upper Extremity Assessment: Defer to OT evaluation  Lower Extremity Assessment Lower Extremity Assessment: Generalized weakness (symmetrical, AROM WNL, strength grossly 4-/5 througout)    Cervical / Trunk Assessment Cervical / Trunk Assessment: Kyphotic  Communication   Communication: Prefers language other than [place];Interpreter utilized (pt speaks broken english; prefers cajun [place] and daughter present to interpret)  Cognition Arousal/Alertness: Awake/alert Behavior During Therapy: WFL for tasks assessed/performed Overall Cognitive Status: Within Functional Limits for tasks assessed         General Comments General comments (skin integrity, edema, etc.): Trialed RA and pt quickly desat to 87%, returned 2L and pt SpO2 >92%, VC for pursed lip breathing and decreased mouth breathing while ambulating    Exercises     Assessment/Plan    PT Assessment Patient needs continued PT services  PT Problem List Decreased strength;Decreased activity tolerance;Decreased balance;Decreased mobility;Decreased knowledge of use of DME;Cardiopulmonary status limiting activity;Obesity;Pain       PT Treatment Interventions DME instruction;Gait training;Functional mobility training;Therapeutic activities;Therapeutic exercise;Balance training;Patient/family education    PT Goals (Current goals can be found in the Care Plan section)  Acute Rehab PT Goals Patient Stated Goal: get stronger and return home with  daughter to assist PT Goal Formulation: With patient/family Time For Goal Achievement: 10/17/20 Potential to Achieve Goals: Good    Frequency Min 3X/week   Barriers to discharge        Co-evaluation PT/OT/SLP Co-Evaluation/Treatment: Yes Reason for Co-Treatment: To address functional/ADL transfers PT goals addressed during session: Mobility/safety with mobility;Balance;Proper use of DME         AM-PAC PT "6 Clicks" Mobility  Outcome Measure Help needed turning from your back to your side while in a flat bed without using bedrails?: A Little Help needed moving from lying on your back to sitting on the side of a flat bed without using bedrails?: A Little Help needed moving to and from a bed to a chair (including a wheelchair)?: A Little Help needed standing up from a chair using your arms (e.g., wheelchair or bedside chair)?: A Little Help needed to walk in hospital room?: A Little Help needed climbing 3-5 steps with a railing? : A Little 6 Click Score: 18    End of Session Equipment Utilized During Treatment: Gait belt;Oxygen;Other (comment) (daughter interpreting) Activity Tolerance: Patient limited by fatigue Patient left: in chair;with call bell/phone within reach;with family/visitor present Nurse Communication: Mobility status;Other (comment) (SpO2) PT Visit Diagnosis: Other abnormalities of gait and mobility (R26.89)    Time: 5631-4970 PT Time Calculation (min) (ACUTE ONLY): 31 min   Charges:   PT Evaluation $PT Eval Moderate Complexity: 1 Mod          Tori Phillipa Morden PT, DPT 10/03/20, 1:45 PM

## 2020-10-03 NOTE — Plan of Care (Signed)
  Problem: Clinical Measurements: Goal: Respiratory complications will improve Outcome: Progressing Goal: Cardiovascular complication will be avoided Outcome: Progressing   Problem: Education: Goal: Knowledge of General Education information will improve Description: Including pain rating scale, medication(s)/side effects and non-pharmacologic comfort measures Outcome: Progressing

## 2020-10-03 NOTE — Progress Notes (Signed)
PROGRESS NOTE  Mandy [name] UUV:[id number] DOB: 1940-01-17 DOA: 10/01/2020 PCP: [name], MD   LOS: 2 days   Brief Narrative / Interim history: 80 year old female with DM2, hyperlipidemia, hypertension, hypothyroidism, arthritis comes into the hospital with complaints of shortness of breath and weakness.  Apparently she was hospitalized just 2 weeks ago and underwent excision of a mediastinal mass.  She was readmitted a day after being discharged with shortness of breath and hypoxia requiring diuresis.  During the second hospitalization she had small bilateral pleural effusions.  She has not been doing well at home since discharge and has been weaker and weaker and more hypoxic.  She was brought to the ER initially requiring nonrebreather then transition to 4 L nasal cannula.  She was found to have a moderate right-sided pleural effusion on the right side.  Subjective / 24h Interval events: Feels a little bit better  Assessment & Plan: Principal Problem Acute hypoxic respiratory failure due to right-sided pleural effusion -she has evidence of fluid overload with vasculature congestion on chest x-ray as well as a pleural effusion -Underwent a 2D echo which showed an EF of 65-70%, normal LVEF.  She did have grade 1 diastolic dysfunction.  RV systolic function is low normal, moderate elevated PA pressure at 58 -Seems to have responded to Lasix, continue for now -Unable to have thoracentesis done due to small pocket and unsafe procedure  Active Problems Hyponatremia -significant, 119 on admission, suspect in the setting of fluid overload as she does appear hypervolemic.  Improving with Lasix  Acute on chronic diastolic CHF-fluid overloaded with chest congestion on the chest x-ray, trace edema and right-sided pleural effusion, continue Lasix  Hyperthyroidism-TSH, T4, T3 unremarkable.  Continue  DM2-continue sliding scale  Essential hypertension-continue amlodipine, atenolol, hold  hydralazine  Hyperlipidemia-continue statin  Hyperkalemia-resolved  Acute kidney injury-borderline, creatinine back to normal  Scheduled Meds:  amLODipine  10 mg Oral Daily   aspirin EC  81 mg Oral Daily   atenolol  50 mg Oral BID   furosemide  40 mg Intravenous Daily   gabapentin  100 mg Oral QHS   insulin aspart  0-15 Units Subcutaneous TID WC   insulin aspart  0-5 Units Subcutaneous QHS   methimazole  2.5 mg Oral Daily   pantoprazole  40 mg Oral Q0600   simvastatin  20 mg Oral QHS   sodium chloride flush  3 mL Intravenous Q12H   Continuous Infusions:  sodium chloride Stopped (10/01/20 1521)   PRN Meds:.acetaminophen **OR** acetaminophen, albuterol, hydrALAZINE, labetalol, morphine injection, traMADol  Diet Orders (From admission, onward)     Start     Ordered   10/02/20 1949  Diet heart healthy/carb modified Room service appropriate? Yes; Fluid consistency: Thin; Fluid restriction: 2000 mL Fluid  Diet effective now       Question Answer Comment  Diet-HS Snack? Nothing   Room service appropriate? Yes   Fluid consistency: Thin   Fluid restriction: 2000 mL Fluid      10/02/20 1948            DVT prophylaxis: SCDs Start: 10/01/20 1837     Code Status: Full Code  Family Communication: Discussed with daughter at bedside  Status is: Inpatient  Remains inpatient appropriate because:Inpatient level of care appropriate due to severity of illness  Dispo: The patient is from: Home              Anticipated d/c is to: Home  Patient currently is not medically stable to d/c.   Difficult to place patient No  Level of care: Telemetry  Consultants:  None  Procedures:  2D echo: pending Thoracentesis-pending  Microbiology  none  Antimicrobials: none    Objective: Vitals:   10/02/20 1958 10/03/20 0051 10/03/20 0456 10/03/20 1146  BP: 138/79 (!) 137/48 (!) 156/65 (!) 118/53  Pulse: 71 78 67 68  Resp: 18 18 18 18   Temp: 98.3 F (36.8 C) 99 F  (37.2 C) 98.7 F (37.1 C) 98.5 F (36.9 C)  TempSrc: Oral Oral Oral Oral  SpO2: 100% 97% 100% 96%  Weight:      Height:        Intake/Output Summary (Last 24 hours) at 10/03/2020 1416 Last data filed at 10/03/2020 1226 Gross per 24 hour  Intake 750 ml  Output 1550 ml  Net -800 ml    Filed Weights   10/01/20 1436 10/01/20 1827  Weight: 77.3 kg 79.3 kg    Examination:  Constitutional: In bed, no distress Eyes: Anicteric ENMT: mmm Neck: normal, supple Respiratory: No wheezing, diminished at the bases, faint crackles Cardiovascular: Regular rate and rhythm, no murmurs, trace edema Abdomen: Soft, NT, ND, bowel sounds positive Musculoskeletal: no clubbing / cyanosis.  Skin: No rashes Neurologic: Nonfocal  Data Reviewed: I have independently reviewed following labs and imaging studies   CBC: Recent Labs  Lab 10/01/20 1514 10/02/20 0402 10/03/20 0418  WBC 7.0 6.1 5.1  NEUTROABS 5.8 4.6 3.1  HGB 11.0* 9.8* 9.7*  HCT 32.1* 28.8* 28.8*  MCV 78.3* 78.0* 79.8*  PLT 469* 398 418*    Basic Metabolic Panel: Recent Labs  Lab 10/01/20 1514 10/02/20 0402 10/02/20 1915 10/03/20 0418  NA 119* 119* 127* 133*  K 5.6* 3.7 4.4 3.8  CL 83* 82* 83* 91*  CO2 27 28 30  34*  GLUCOSE 134* 121* 89 98  BUN 13 11 9 10   CREATININE 1.20* 1.04* 0.94 0.77  CALCIUM 9.2 8.7* 8.7* 9.0  MG  --  1.9  --  1.8    Liver Function Tests: Recent Labs  Lab 10/01/20 1514 10/02/20 0402 10/03/20 0418  AST 33 16 16  ALT 7 7 7   ALKPHOS 42 38 38  BILITOT 0.9 0.5 0.4  PROT 7.5 6.3* 6.4*  ALBUMIN 4.0 3.5 3.3*    Coagulation Profile: No results for input(s): INR, PROTIME in the last 168 hours. HbA1C: No results for input(s): HGBA1C in the last 72 hours. CBG: Recent Labs  Lab 10/02/20 1054 10/02/20 1610 10/02/20 2002 10/03/20 0715 10/03/20 1056  GLUCAP 109* 103* 87 104* 209*     Recent Results (from the past 240 hour(s))  Resp Panel by RT-PCR (Flu A&B, Covid) Nasopharyngeal Swab      Status: None   Collection Time: 09/24/20 10:30 PM   Specimen: Nasopharyngeal Swab; Nasopharyngeal(NP) swabs in vial transport medium  Result Value Ref Range Status   SARS Coronavirus 2 by RT PCR NEGATIVE NEGATIVE Final    Comment: (NOTE) SARS-CoV-2 target nucleic acids are NOT DETECTED.  The SARS-CoV-2 RNA is generally detectable in upper respiratory specimens during the acute phase of infection. The lowest concentration of SARS-CoV-2 viral copies this assay can detect is 138 copies/mL. A negative result does not preclude SARS-Cov-2 infection and should not be used as the sole basis for treatment or other patient management decisions. A negative result may occur with  improper specimen collection/handling, submission of specimen other than nasopharyngeal swab, presence of viral mutation(s) within the areas  targeted by this assay, and inadequate number of viral copies(<138 copies/mL). A negative result must be combined with clinical observations, patient history, and epidemiological information. The expected result is Negative.  Fact Sheet for Patients:  EntrepreneurPulse.com.au  Fact Sheet for Healthcare Providers:  IncredibleEmployment.be  This test is no t yet approved or cleared by the [place] FDA and  has been authorized for detection and/or diagnosis of SARS-CoV-2 by FDA under an Emergency Use Authorization (EUA). This EUA will remain  in effect (meaning this test can be used) for the duration of the COVID-19 declaration under Section 564(b)(1) of the Act, 21 U.S.C.section 360bbb-3(b)(1), unless the authorization is terminated  or revoked sooner.       Influenza A by PCR NEGATIVE NEGATIVE Final   Influenza B by PCR NEGATIVE NEGATIVE Final    Comment: (NOTE) The Xpert Xpress SARS-CoV-2/FLU/RSV plus assay is intended as an aid in the diagnosis of influenza from Nasopharyngeal swab specimens and should not be used as a sole  basis for treatment. Nasal washings and aspirates are unacceptable for Xpert Xpress SARS-CoV-2/FLU/RSV testing.  Fact Sheet for Patients: EntrepreneurPulse.com.au  Fact Sheet for Healthcare Providers: IncredibleEmployment.be  This test is not yet approved or cleared by the [place] FDA and has been authorized for detection and/or diagnosis of SARS-CoV-2 by FDA under an Emergency Use Authorization (EUA). This EUA will remain in effect (meaning this test can be used) for the duration of the COVID-19 declaration under Section 564(b)(1) of the Act, 21 U.S.C. section 360bbb-3(b)(1), unless the authorization is terminated or revoked.  Performed at [facility], [place] [address], [place], [place] [address]   Culture, blood (Routine X 2) w Reflex to ID Panel     Status: None (Preliminary result)   Collection Time: 10/01/20  3:16 PM   Specimen: BLOOD  Result Value Ref Range Status   Specimen Description   Final    BLOOD RIGHT ANTECUBITAL Performed at [facility] [address], [place], [place] [address]    Special Requests   Final    BOTTLES DRAWN AEROBIC AND ANAEROBIC Blood Culture adequate volume Performed at [facility] [address], [place], [place] [address]    Culture   Final    NO GROWTH 2 DAYS Performed at [facility] [address], [place], [place] [address]    Report Status PENDING  Incomplete  SARS CORONAVIRUS 2 (TAT 6-24 HRS) Nasopharyngeal Nasopharyngeal Swab     Status: None   Collection Time: 10/01/20  3:17 PM   Specimen: Nasopharyngeal Swab  Result Value Ref Range Status   SARS Coronavirus 2 NEGATIVE NEGATIVE Final    Comment: (NOTE) SARS-CoV-2 target nucleic acids are NOT DETECTED.  The SARS-CoV-2 RNA is generally detectable in upper and lower respiratory specimens during the acute phase of infection. Negative results do not preclude SARS-CoV-2 infection, do not  rule out co-infections with other pathogens, and should not be used as the sole basis for treatment or other patient management decisions. Negative results must be combined with clinical observations, patient history, and epidemiological information. The expected result is Negative.  Fact Sheet for Patients: SugarRoll.be  Fact Sheet for Healthcare Providers: https://www.woods-mathews.com/  This test is not yet approved or cleared by the [place] FDA and  has been authorized for detection and/or diagnosis of SARS-CoV-2 by FDA under an Emergency Use Authorization (EUA). This EUA will remain  in effect (meaning this test can be used) for the duration  of the COVID-19 declaration under Se ction 564(b)(1) of the Act, 21 U.S.C. section 360bbb-3(b)(1), unless the authorization is terminated or revoked sooner.  Performed at [facility], [place] [address] [place], [place] [address]   Culture, blood (Routine X 2) w Reflex to ID Panel     Status: None (Preliminary result)   Collection Time: 10/01/20  7:14 PM   Specimen: BLOOD  Result Value Ref Range Status   Specimen Description   Final    BLOOD LEFT ANTECUBITAL Performed at [facility] [address] [place], [place] [address]    Special Requests   Final    BOTTLES DRAWN AEROBIC ONLY Blood Culture results may not be optimal due to an inadequate volume of blood received in culture bottles Performed at [facility] [address], [place], [place] [address]    Culture   Final    NO GROWTH 2 DAYS Performed at [facility] [address], [place], [place] [address]    Report Status PENDING  Incomplete      Radiology Studies: CT CHEST WO CONTRAST  Result Date: 10/02/2020 CLINICAL DATA:  Pneumonia, effusion or abscess suspected. Shortness of breath. EXAM: CT CHEST WITHOUT CONTRAST TECHNIQUE: Multidetector CT imaging of the chest was  performed following the standard protocol without IV contrast. COMPARISON:  Chest x-ray 10/01/2020. CT angiogram chest 09/24/2020. CT chest 08/09/2018. FINDINGS: Cardiovascular: Aorta is normal in size. The heart is enlarged. There is no pericardial effusion. There are atherosclerotic calcifications of the aorta and coronary arteries. Mediastinum/Nodes: No enlarged mediastinal or axillary lymph nodes. Thyroid gland, trachea, and esophagus demonstrate no significant findings. Lungs/Pleura: There is a moderate-sized right pleural effusion which is increased compared to the prior study. There is compressive atelectasis of the right lower lobe. A few air bronchograms are present in the right lower lobe and consolidation is not excluded. There is a stable nodular density in the right middle lobe measuring 7 mm, likely benign given stability. There is some dependent atelectasis in the left lower lobe and some stable scarring in the bilateral upper lobes. There is no evidence for pneumothorax. Trachea and central airways are patent. Upper Abdomen: No acute abnormality. Musculoskeletal: Right lateral chest wall edema is unchanged. No focal fluid collections are seen. Multilevel degenerative changes affect the spine. IMPRESSION: 1. Moderate right pleural effusion has increased. 2. Right lower lobe atelectasis/consolidation has increased. 3. Nonspecific right lateral chest wall edema is unchanged. Correlate clinically for history of trauma or infection. 4. Stable cardiomegaly. 5.  Aortic Atherosclerosis (ICD10-I70.0). Electronically Signed   By: Ronney Asters M.D.   On: 10/02/2020 19:35   [place] CHEST (PLEURAL EFFUSION)  Result Date: 10/02/2020 CLINICAL DATA:  Pleural effusion. EXAM: CHEST ULTRASOUND COMPARISON:  Chest x-ray from yesterday. FINDINGS: Small right pleural effusion.  No significant left pleural effusion. IMPRESSION: 1. Small right pleural effusion, too small to safely perform thoracentesis. 2. No significant  left pleural effusion. Electronically Signed   By: Titus Dubin M.D.   On: 10/02/2020 17:43   ECHOCARDIOGRAM COMPLETE  Result Date: 10/02/2020    ECHOCARDIOGRAM REPORT   Patient Name:   KENETHA COZZA Date of Exam: 10/02/2020 Medical Rec #:  [id number]      Height:       59.0 in Accession #:    [id number]     Weight:       174.8 lb Date of Birth:  [birth year]       BSA:  1.742 m Patient Age:    27 years       BP:           106/78 mmHg Patient Gender: F              HR:           73 bpm. Exam Location:  Inpatient Procedure: 2D Echo, Color Doppler and Cardiac Doppler Indications:    T53.20 Acute diastolic (congestive) heart failure  History:        Patient has prior history of Echocardiogram examinations, most                 recent 02/03/2019. CHF; Risk Factors:Hypertension, Diabetes and                 Dyslipidemia. Recent removal of mediastinal mass.  Sonographer:    Raquel Sarna Senior RDCS Referring Phys: [place]  Sonographer Comments: Technically challenging study due to limited acoustic windows. Technically difficult due to large body habitus. IMPRESSIONS  1. Left ventricular ejection fraction, by estimation, is 65 to 70%. The left ventricle has normal function. The left ventricle has no regional wall motion abnormalities. There is mild left ventricular hypertrophy. Left ventricular diastolic parameters are consistent with Grade I diastolic dysfunction (impaired relaxation).  2. Right ventricular systolic function is low normal. The right ventricular size is normal. There is moderately elevated pulmonary artery systolic pressure. The estimated right ventricular systolic pressure is 23.3 mmHg.  3. The mitral valve is grossly normal. No evidence of mitral valve regurgitation.  4. The aortic valve was not well visualized. Aortic valve regurgitation is not visualized.  5. The inferior vena cava is normal in size with greater than 50% respiratory variability, suggesting right atrial pressure of 3 mmHg.  Comparison(s): Changes from prior study are noted. 02/03/2019: LVEF 50-55%. FINDINGS  Left Ventricle: Left ventricular ejection fraction, by estimation, is 65 to 70%. The left ventricle has normal function. The left ventricle has no regional wall motion abnormalities. The left ventricular internal cavity size was normal in size. There is  mild left ventricular hypertrophy. Left ventricular diastolic parameters are consistent with Grade I diastolic dysfunction (impaired relaxation). Indeterminate filling pressures. Right Ventricle: The right ventricular size is normal. No increase in right ventricular wall thickness. Right ventricular systolic function is low normal. There is moderately elevated pulmonary artery systolic pressure. The tricuspid regurgitant velocity  is 3.71 m/s, and with an assumed right atrial pressure of 3 mmHg, the estimated right ventricular systolic pressure is 43.5 mmHg. Left Atrium: Left atrial size was normal in size. Right Atrium: Right atrial size was normal in size. Pericardium: There is no evidence of pericardial effusion. Mitral Valve: The mitral valve is grossly normal. No evidence of mitral valve regurgitation. Tricuspid Valve: The tricuspid valve is grossly normal. Tricuspid valve regurgitation is mild. Aortic Valve: The aortic valve was not well visualized. Aortic valve regurgitation is not visualized. Pulmonic Valve: The pulmonic valve was normal in structure. Pulmonic valve regurgitation is not visualized. Aorta: The aortic root and ascending aorta are structurally normal, with no evidence of dilitation. Venous: The inferior vena cava is normal in size with greater than 50% respiratory variability, suggesting right atrial pressure of 3 mmHg. IAS/Shunts: The interatrial septum was not well visualized.  LEFT VENTRICLE PLAX 2D LVIDd:         3.80 cm  Diastology LVIDs:         2.30 cm  LV e' medial:    3.92 cm/s LV  PW:         1.40 cm  LV E/e' medial:  13.4 LV IVS:        1.10 cm  LV e'  lateral:   3.92 cm/s LVOT diam:     2.00 cm  LV E/e' lateral: 13.4 LV SV:         68 LV SV Index:   39 LVOT Area:     3.14 cm  RIGHT VENTRICLE RV S prime:     10.60 cm/s TAPSE (M-mode): 2.5 cm LEFT ATRIUM             Index       RIGHT ATRIUM           Index LA diam:        3.30 cm 1.89 cm/m  RA Area:     19.10 cm LA Vol (A2C):   34.2 ml 19.63 ml/m RA Volume:   53.70 ml  30.83 ml/m LA Vol (A4C):   35.3 ml 20.27 ml/m LA Biplane Vol: 34.9 ml 20.04 ml/m  AORTIC VALVE LVOT Vmax:   104.00 cm/s LVOT Vmean:  79.100 cm/s LVOT VTI:    0.215 m  AORTA Ao Root diam: 3.00 cm Ao Asc diam:  3.50 cm MITRAL VALVE                TRICUSPID VALVE MV Area (PHT): 3.17 cm     TR Peak grad:   55.1 mmHg MV Decel Time: 239 msec     TR Vmax:        371.00 cm/s MV E velocity: 52.50 cm/s MV A velocity: 118.00 cm/s  SHUNTS MV E/A ratio:  0.44         Systemic VTI:  0.22 m                             Systemic Diam: 2.00 cm Lyman Bishop MD Electronically signed by Lyman Bishop MD Signature Date/Time: 10/02/2020/3:53:40 PM    Final     Marzetta Board, MD, PhD [facility]  Between 7 am - 7 pm I am available, please contact me via Amion (for emergencies) or Securechat (non urgent messages)  Between 7 pm - 7 am I am not available, please contact night coverage MD/APP via Amion

## 2020-10-03 NOTE — Plan of Care (Signed)
  Problem: Clinical Measurements: Goal: Will remain free from infection Outcome: Completed/Met

## 2020-10-04 DIAGNOSIS — J9601 Acute respiratory failure with hypoxia: Secondary | ICD-10-CM | POA: Diagnosis not present

## 2020-10-04 DIAGNOSIS — J90 Pleural effusion, not elsewhere classified: Secondary | ICD-10-CM | POA: Diagnosis not present

## 2020-10-04 LAB — COMPREHENSIVE METABOLIC PANEL
ALT: 8 U/L (ref 0–44)
AST: 17 U/L (ref 15–41)
Albumin: 3.4 g/dL — ABNORMAL LOW (ref 3.5–5.0)
Alkaline Phosphatase: 40 U/L (ref 38–126)
Anion gap: 13 (ref 5–15)
BUN: 11 mg/dL (ref 8–23)
CO2: 32 mmol/L (ref 22–32)
Calcium: 8.8 mg/dL — ABNORMAL LOW (ref 8.9–10.3)
Chloride: 88 mmol/L — ABNORMAL LOW (ref 98–111)
Creatinine, Ser: 0.96 mg/dL (ref 0.44–1.00)
GFR, Estimated: >60 mL/min (ref >60)
Glucose, Bld: 98 mg/dL (ref 70–99)
Potassium: 3.7 mmol/L (ref 3.5–5.1)
Sodium: 133 mmol/L — ABNORMAL LOW (ref 135–145)
Total Bilirubin: 0.5 mg/dL (ref 0.3–1.2)
Total Protein: 6.6 g/dL (ref 6.5–8.1)

## 2020-10-04 LAB — CBC WITH DIFFERENTIAL/PLATELET
Abs Immature Granulocytes: 0.04 10*3/uL (ref 0.00–0.07)
Basophils Absolute: 0 10*3/uL (ref 0.0–0.1)
Basophils Relative: 1 %
Eosinophils Absolute: 0.2 10*3/uL (ref 0.0–0.5)
Eosinophils Relative: 3 %
HCT: 29.8 % — ABNORMAL LOW (ref 36.0–46.0)
Hemoglobin: 10 g/dL — ABNORMAL LOW (ref 12.0–15.0)
Immature Granulocytes: 1 %
Lymphocytes Relative: 26 %
Lymphs Abs: 1.7 10*3/uL (ref 0.7–4.0)
MCH: 26.7 pg (ref 26.0–34.0)
MCHC: 33.6 g/dL (ref 30.0–36.0)
MCV: 79.5 fL — ABNORMAL LOW (ref 80.0–100.0)
Monocytes Absolute: 1.1 10*3/uL — ABNORMAL HIGH (ref 0.1–1.0)
Monocytes Relative: 16 %
Neutro Abs: 3.6 10*3/uL (ref 1.7–7.7)
Neutrophils Relative %: 53 %
Platelets: 489 10*3/uL — ABNORMAL HIGH (ref 150–400)
RBC: 3.75 MIL/uL — ABNORMAL LOW (ref 3.87–5.11)
RDW: 14.8 % (ref 11.5–15.5)
WBC: 6.6 10*3/uL (ref 4.0–10.5)
nRBC: 0 % (ref 0.0–0.2)

## 2020-10-04 LAB — GLUCOSE, CAPILLARY
Glucose-Capillary: 109 mg/dL — ABNORMAL HIGH (ref 70–99)
Glucose-Capillary: 146 mg/dL — ABNORMAL HIGH (ref 70–99)
Glucose-Capillary: 127 mg/dL — ABNORMAL HIGH (ref 70–99)
Glucose-Capillary: 107 mg/dL — ABNORMAL HIGH (ref 70–99)

## 2020-10-04 LAB — MAGNESIUM: Magnesium: 2 mg/dL (ref 1.7–2.4)

## 2020-10-04 LAB — T3: T3, Total: 77 ng/dL (ref 71–180)

## 2020-10-04 MED ORDER — ONDANSETRON HCL 4 MG/2ML IJ SOLN
4.0000 mg | Freq: Once | INTRAMUSCULAR | Status: AC
  Administered 2020-10-04: 4 mg via INTRAVENOUS
  Filled 2020-10-04: qty 2

## 2020-10-04 NOTE — Progress Notes (Signed)
PROGRESS NOTE  Mandy [name] PNT:[id number] DOB: 04/08/1940 DOA: 10/01/2020 PCP: [name], MD   LOS: 3 days   Brief Narrative / Interim history: 80 year old female with DM2, hyperlipidemia, hypertension, hypothyroidism, arthritis comes into the hospital with complaints of shortness of breath and weakness.  Apparently she was hospitalized just 2 weeks ago and underwent excision of a mediastinal mass.  She was readmitted a day after being discharged with shortness of breath and hypoxia requiring diuresis.  During the second hospitalization she had small bilateral pleural effusions.  She has not been doing well at home since discharge and has been weaker and weaker and more hypoxic.  She was brought to the ER initially requiring nonrebreather then transition to 4 L nasal cannula.  She was found to have a moderate right-sided pleural effusion on the right side.  Subjective / 24h Interval events: Feels a bit better, was able to work with therapy yesterday  Assessment & Plan: Principal Problem Acute hypoxic respiratory failure due to right-sided pleural effusion -she has evidence of fluid overload with vasculature congestion on chest x-ray as well as a pleural effusion -Underwent a 2D echo which showed an EF of 65-70%, normal LVEF.  She did have grade 1 diastolic dysfunction.  RV systolic function is low normal, moderate elevated PA pressure at 58 -Seems to have responded to Lasix, continue for now, diuresing well and clinically improving -Unable to have thoracentesis done due to small pocket and unsafe procedure.  Diuresing  Active Problems Hyponatremia -significant, 119 on admission, suspect in the setting of fluid overload as she does appear hypervolemic.  Continues to improve with Lasix  Acute on chronic diastolic CHF-fluid overloaded with chest congestion on the chest x-ray, trace edema and right-sided pleural effusion, continue Lasix  Hyperthyroidism-TSH, T4, T3 unremarkable.   Continue  DM2-continue sliding scale  CBG (last 3)  Recent Labs    10/03/20 1550 10/03/20 2144 10/04/20 0723  GLUCAP 84 115* 109*    Essential hypertension-continue amlodipine, atenolol, hold hydralazine  Hyperlipidemia-continue statin  Hyperkalemia-resolved  Acute kidney injury-borderline, creatinine back to normal  Scheduled Meds:  amLODipine  10 mg Oral Daily   aspirin EC  81 mg Oral Daily   atenolol  50 mg Oral BID   furosemide  40 mg Intravenous Daily   gabapentin  100 mg Oral QHS   insulin aspart  0-15 Units Subcutaneous TID WC   insulin aspart  0-5 Units Subcutaneous QHS   methimazole  2.5 mg Oral Daily   pantoprazole  40 mg Oral Q0600   polyethylene glycol  17 g Oral Daily   senna-docusate  1 tablet Oral BID   simvastatin  20 mg Oral QHS   sodium chloride flush  3 mL Intravenous Q12H   Continuous Infusions:  sodium chloride Stopped (10/01/20 1521)   PRN Meds:.acetaminophen **OR** acetaminophen, albuterol, hydrALAZINE, labetalol, morphine injection, traMADol  Diet Orders (From admission, onward)     Start     Ordered   10/02/20 1949  Diet heart healthy/carb modified Room service appropriate? Yes; Fluid consistency: Thin; Fluid restriction: 2000 mL Fluid  Diet effective now       Question Answer Comment  Diet-HS Snack? Nothing   Room service appropriate? Yes   Fluid consistency: Thin   Fluid restriction: 2000 mL Fluid      10/02/20 1948            DVT prophylaxis: SCDs Start: 10/01/20 1837     Code Status: Full Code  Family Communication: Discussed with  daughter at bedside  Status is: Inpatient  Remains inpatient appropriate because:Inpatient level of care appropriate due to severity of illness  Dispo: The patient is from: Home              Anticipated d/c is to: Home              Patient currently is not medically stable to d/c.   Difficult to place patient No  Level of care: Telemetry  Consultants:  None  Procedures:  2D echo:  pending Thoracentesis-pending  Microbiology  none  Antimicrobials: none    Objective: Vitals:   10/03/20 0456 10/03/20 1146 10/03/20 2000 10/04/20 0452  BP: (!) 156/65 (!) 118/53 (!) 126/92 138/77  Pulse: 67 68 72 65  Resp: 18 18 17 20   Temp: 98.7 F (37.1 C) 98.5 F (36.9 C) 98.3 F (36.8 C) 98 F (36.7 C)  TempSrc: Oral Oral Oral Oral  SpO2: 100% 96% 98% 100%  Weight:      Height:        Intake/Output Summary (Last 24 hours) at 10/04/2020 6384 Last data filed at 10/04/2020 0400 Gross per 24 hour  Intake 720 ml  Output 1450 ml  Net -730 ml    Filed Weights   10/01/20 1436 10/01/20 1827  Weight: 77.3 kg 79.3 kg    Examination:  Constitutional: In bed, laying flat, NAD Eyes: No scleral icterus ENMT: mmm Neck: normal, supple Respiratory: No wheezing heard, diminished at bases, faint crackles Cardiovascular: Regular rate and rhythm, no murmurs, trace edema Abdomen: Soft, nontender, nondistended, bowel sounds positive Musculoskeletal: no clubbing / cyanosis.  Skin: No rashes appreciated Neurologic: No focal deficits  Data Reviewed: I have independently reviewed following labs and imaging studies   CBC: Recent Labs  Lab 10/01/20 1514 10/02/20 0402 10/03/20 0418 10/04/20 0438  WBC 7.0 6.1 5.1 6.6  NEUTROABS 5.8 4.6 3.1 3.6  HGB 11.0* 9.8* 9.7* 10.0*  HCT 32.1* 28.8* 28.8* 29.8*  MCV 78.3* 78.0* 79.8* 79.5*  PLT 469* 398 418* 489*    Basic Metabolic Panel: Recent Labs  Lab 10/01/20 1514 10/02/20 0402 10/02/20 1915 10/03/20 0418 10/04/20 0438  NA 119* 119* 127* 133* 133*  K 5.6* 3.7 4.4 3.8 3.7  CL 83* 82* 83* 91* 88*  CO2 27 28 30  34* 32  GLUCOSE 134* 121* 89 98 98  BUN 13 11 9 10 11   CREATININE 1.20* 1.04* 0.94 0.77 0.96  CALCIUM 9.2 8.7* 8.7* 9.0 8.8*  MG  --  1.9  --  1.8 2.0    Liver Function Tests: Recent Labs  Lab 10/01/20 1514 10/02/20 0402 10/03/20 0418 10/04/20 0438  AST 33 16 16 17   ALT 7 7 7 8   ALKPHOS 42 38 38 40   BILITOT 0.9 0.5 0.4 0.5  PROT 7.5 6.3* 6.4* 6.6  ALBUMIN 4.0 3.5 3.3* 3.4*    Coagulation Profile: No results for input(s): INR, PROTIME in the last 168 hours. HbA1C: No results for input(s): HGBA1C in the last 72 hours. CBG: Recent Labs  Lab 10/03/20 0715 10/03/20 1056 10/03/20 1550 10/03/20 2144 10/04/20 0723  GLUCAP 104* 209* 84 115* 109*     Recent Results (from the past 240 hour(s))  Resp Panel by RT-PCR (Flu A&B, Covid) Nasopharyngeal Swab     Status: None   Collection Time: 09/24/20 10:30 PM   Specimen: Nasopharyngeal Swab; Nasopharyngeal(NP) swabs in vial transport medium  Result Value Ref Range Status   SARS Coronavirus 2 by RT PCR NEGATIVE NEGATIVE  Final    Comment: (NOTE) SARS-CoV-2 target nucleic acids are NOT DETECTED.  The SARS-CoV-2 RNA is generally detectable in upper respiratory specimens during the acute phase of infection. The lowest concentration of SARS-CoV-2 viral copies this assay can detect is 138 copies/mL. A negative result does not preclude SARS-Cov-2 infection and should not be used as the sole basis for treatment or other patient management decisions. A negative result may occur with  improper specimen collection/handling, submission of specimen other than nasopharyngeal swab, presence of viral mutation(s) within the areas targeted by this assay, and inadequate number of viral copies(<138 copies/mL). A negative result must be combined with clinical observations, patient history, and epidemiological information. The expected result is Negative.  Fact Sheet for Patients:  EntrepreneurPulse.com.au  Fact Sheet for Healthcare Providers:  IncredibleEmployment.be  This test is no t yet approved or cleared by the [place] FDA and  has been authorized for detection and/or diagnosis of SARS-CoV-2 by FDA under an Emergency Use Authorization (EUA). This EUA will remain  in effect (meaning this test can be  used) for the duration of the COVID-19 declaration under Section 564(b)(1) of the Act, 21 U.S.C.section 360bbb-3(b)(1), unless the authorization is terminated  or revoked sooner.       Influenza A by PCR NEGATIVE NEGATIVE Final   Influenza B by PCR NEGATIVE NEGATIVE Final    Comment: (NOTE) The Xpert Xpress SARS-CoV-2/FLU/RSV plus assay is intended as an aid in the diagnosis of influenza from Nasopharyngeal swab specimens and should not be used as a sole basis for treatment. Nasal washings and aspirates are unacceptable for Xpert Xpress SARS-CoV-2/FLU/RSV testing.  Fact Sheet for Patients: EntrepreneurPulse.com.au  Fact Sheet for Healthcare Providers: IncredibleEmployment.be  This test is not yet approved or cleared by the [place] FDA and has been authorized for detection and/or diagnosis of SARS-CoV-2 by FDA under an Emergency Use Authorization (EUA). This EUA will remain in effect (meaning this test can be used) for the duration of the COVID-19 declaration under Section 564(b)(1) of the Act, 21 U.S.C. section 360bbb-3(b)(1), unless the authorization is terminated or revoked.  Performed at [facility], [place] [address], [place], [place] [address]   Culture, blood (Routine X 2) w Reflex to ID Panel     Status: None (Preliminary result)   Collection Time: 10/01/20  3:16 PM   Specimen: BLOOD  Result Value Ref Range Status   Specimen Description   Final    BLOOD RIGHT ANTECUBITAL Performed at [facility] [address] [place], [place] [address]    Special Requests   Final    BOTTLES DRAWN AEROBIC AND ANAEROBIC Blood Culture adequate volume Performed at [facility] [address] [place], [place] [address]    Culture   Final    NO GROWTH 3 DAYS Performed at [facility], [place] [address], [place], [place] [address]    Report Status PENDING  Incomplete  SARS CORONAVIRUS 2 (TAT  6-24 HRS) Nasopharyngeal Nasopharyngeal Swab     Status: None   Collection Time: 10/01/20  3:17 PM   Specimen: Nasopharyngeal Swab  Result Value Ref Range Status   SARS Coronavirus 2 NEGATIVE NEGATIVE Final    Comment: (NOTE) SARS-CoV-2 target nucleic acids are NOT DETECTED.  The SARS-CoV-2 RNA is generally detectable in upper and lower respiratory specimens during the acute phase of infection. Negative results do not preclude SARS-CoV-2 infection, do not rule out co-infections with other pathogens, and should not be  used as the sole basis for treatment or other patient management decisions. Negative results must be combined with clinical observations, patient history, and epidemiological information. The expected result is Negative.  Fact Sheet for Patients: SugarRoll.be  Fact Sheet for Healthcare Providers: https://www.woods-mathews.com/  This test is not yet approved or cleared by the [place] FDA and  has been authorized for detection and/or diagnosis of SARS-CoV-2 by FDA under an Emergency Use Authorization (EUA). This EUA will remain  in effect (meaning this test can be used) for the duration of the COVID-19 declaration under Se ction 564(b)(1) of the Act, 21 U.S.C. section 360bbb-3(b)(1), unless the authorization is terminated or revoked sooner.  Performed at [facility], [place] [address] [place], [place] [address]   Culture, blood (Routine X 2) w Reflex to ID Panel     Status: None (Preliminary result)   Collection Time: 10/01/20  7:14 PM   Specimen: BLOOD  Result Value Ref Range Status   Specimen Description   Final    BLOOD LEFT ANTECUBITAL Performed at [facility] [address] [place], [place] [address]    Special Requests   Final    BOTTLES DRAWN AEROBIC ONLY Blood Culture results may not be optimal due to an inadequate volume of blood received in culture bottles Performed at [facility] [address] [place], [place] [address]    Culture   Final    NO GROWTH 3 DAYS Performed at [facility], [place] [address], [place], [place] [address]    Report Status PENDING  Incomplete      Radiology Studies: No results found.  Marzetta Board, MD, PhD [facility]  Between 7 am - 7 pm I am available, please contact me via Amion (for emergencies) or Securechat (non urgent messages)  Between 7 pm - 7 am I am not available, please contact night coverage MD/APP via Amion

## 2020-10-04 NOTE — Progress Notes (Signed)
Patient took oxygen off and walked to the bathroom by herself for BM, nurse checked pulse ox read as 78% on room air with ambulation.  Applied 2 liters oxygen, patient quickly rebounded to 95%.  Attempted to educate patient that she must leave oxygen on.

## 2020-10-05 ENCOUNTER — Inpatient Hospital Stay (HOSPITAL_COMMUNITY): Payer: Medicare HMO

## 2020-10-05 DIAGNOSIS — J90 Pleural effusion, not elsewhere classified: Secondary | ICD-10-CM | POA: Diagnosis not present

## 2020-10-05 DIAGNOSIS — J9601 Acute respiratory failure with hypoxia: Secondary | ICD-10-CM | POA: Diagnosis not present

## 2020-10-05 LAB — COMPREHENSIVE METABOLIC PANEL
ALT: 8 U/L (ref 0–44)
AST: 17 U/L (ref 15–41)
Albumin: 3.3 g/dL — ABNORMAL LOW (ref 3.5–5.0)
Alkaline Phosphatase: 37 U/L — ABNORMAL LOW (ref 38–126)
Anion gap: 9 (ref 5–15)
BUN: 13 mg/dL (ref 8–23)
CO2: 33 mmol/L — ABNORMAL HIGH (ref 22–32)
Calcium: 8.4 mg/dL — ABNORMAL LOW (ref 8.9–10.3)
Chloride: 89 mmol/L — ABNORMAL LOW (ref 98–111)
Creatinine, Ser: 1.1 mg/dL — ABNORMAL HIGH (ref 0.44–1.00)
GFR, Estimated: 51 mL/min — ABNORMAL LOW (ref >60)
Glucose, Bld: 133 mg/dL — ABNORMAL HIGH (ref 70–99)
Potassium: 3.5 mmol/L (ref 3.5–5.1)
Sodium: 131 mmol/L — ABNORMAL LOW (ref 135–145)
Total Bilirubin: 0.5 mg/dL (ref 0.3–1.2)
Total Protein: 6.1 g/dL — ABNORMAL LOW (ref 6.5–8.1)

## 2020-10-05 LAB — CBC WITH DIFFERENTIAL/PLATELET
Abs Immature Granulocytes: 0.03 10*3/uL (ref 0.00–0.07)
Basophils Absolute: 0 10*3/uL (ref 0.0–0.1)
Basophils Relative: 1 %
Eosinophils Absolute: 0.1 10*3/uL (ref 0.0–0.5)
Eosinophils Relative: 2 %
HCT: 33.8 % — ABNORMAL LOW (ref 36.0–46.0)
Hemoglobin: 11.1 g/dL — ABNORMAL LOW (ref 12.0–15.0)
Immature Granulocytes: 1 %
Lymphocytes Relative: 24 %
Lymphs Abs: 1 10*3/uL (ref 0.7–4.0)
MCH: 26.5 pg (ref 26.0–34.0)
MCHC: 32.8 g/dL (ref 30.0–36.0)
MCV: 80.7 fL (ref 80.0–100.0)
Monocytes Absolute: 0.6 10*3/uL (ref 0.1–1.0)
Monocytes Relative: 15 %
Neutro Abs: 2.4 10*3/uL (ref 1.7–7.7)
Neutrophils Relative %: 57 %
Platelets: 359 10*3/uL (ref 150–400)
RBC: 4.19 MIL/uL (ref 3.87–5.11)
RDW: 15.1 % (ref 11.5–15.5)
WBC: 4.1 10*3/uL (ref 4.0–10.5)
nRBC: 0 % (ref 0.0–0.2)

## 2020-10-05 LAB — MAGNESIUM: Magnesium: 1.8 mg/dL (ref 1.7–2.4)

## 2020-10-05 LAB — GLUCOSE, CAPILLARY
Glucose-Capillary: 119 mg/dL — ABNORMAL HIGH (ref 70–99)
Glucose-Capillary: 132 mg/dL — ABNORMAL HIGH (ref 70–99)
Glucose-Capillary: 165 mg/dL — ABNORMAL HIGH (ref 70–99)
Glucose-Capillary: 129 mg/dL — ABNORMAL HIGH (ref 70–99)

## 2020-10-05 MED ORDER — MELATONIN 3 MG PO TABS
3.0000 mg | ORAL_TABLET | Freq: Every day | ORAL | Status: AC
  Administered 2020-10-05 – 2020-10-06 (×2): 3 mg via ORAL
  Filled 2020-10-05 (×2): qty 1

## 2020-10-05 MED ORDER — ONDANSETRON HCL 4 MG/2ML IJ SOLN
4.0000 mg | Freq: Four times a day (QID) | INTRAMUSCULAR | Status: DC | PRN
  Administered 2020-10-05: 4 mg via INTRAVENOUS
  Filled 2020-10-05: qty 2

## 2020-10-05 NOTE — Progress Notes (Signed)
O2 sat checked on RA while obtaining orthostatic VS. O2 sat at rest on RA 94%. After sitting up to eob and standing O2 sat dropped to 85% and sustained there while standing. Pt reported feeling dizzy and SOB so placed back on 2lnc with O2 sat rebounding to 96% on 2LNC. Pt declined to remain standing for 3 minute standing BP and declined to ambulate due to dizziness.

## 2020-10-05 NOTE — Progress Notes (Signed)
Pt vomited on way back from [place] this morning, emesis was undigested food. Pt reports feeling dizzy at the time. HR 80's, O2 sat 97% 2LNC. Dr Cruzita Lederer aware, zofran ordered and will implement.

## 2020-10-05 NOTE — Plan of Care (Signed)
Mandy [name] ate well for breakfast this morning but became nauseated and vomited later in the morning. Zofran given with fair effect though pt was wary of eating for the remainder of the day despite encouragement. She also reported dizziness with ambulation to/from the bathroom. Orthostatic VS obtained and documented. She did desat to 85% on RA with minimal activity. Mandy [name] was able to tolerate some rice, broth, and jello this afternoon. She felt better later in the day and was agreeable to getting oob to the chair.  Problem: Clinical Measurements: Goal: Ability to maintain clinical measurements within normal limits will improve Outcome: Progressing Goal: Respiratory complications will improve Outcome: Progressing Goal: Cardiovascular complication will be avoided Outcome: Progressing   Problem: Education: Goal: Knowledge of General Education information will improve Description: Including pain rating scale, medication(s)/side effects and non-pharmacologic comfort measures Outcome: Progressing   Problem: Health Behavior/Discharge Planning: Goal: Ability to manage health-related needs will improve Outcome: Progressing   Problem: Clinical Measurements: Goal: Ability to maintain clinical measurements within normal limits will improve Outcome: Progressing Goal: Will remain free from infection Outcome: Progressing Goal: Diagnostic test results will improve Outcome: Progressing Goal: Respiratory complications will improve Outcome: Progressing Goal: Cardiovascular complication will be avoided Outcome: Progressing   Problem: Activity: Goal: Risk for activity intolerance will decrease Outcome: Progressing   Problem: Nutrition: Goal: Adequate nutrition will be maintained Outcome: Progressing   Problem: Pain Managment: Goal: General experience of comfort will improve Outcome: Progressing   Problem: Safety: Goal: Ability to remain free from injury will improve Outcome: Progressing

## 2020-10-05 NOTE — Progress Notes (Signed)
PROGRESS NOTE  Mandy [name] WCB:[id number] DOB: Aug 29, 1940 DOA: 10/01/2020 PCP: [name], MD   LOS: 4 days   Brief Narrative / Interim history: 80 year old female with DM2, hyperlipidemia, hypertension, hypothyroidism, arthritis comes into the hospital with complaints of shortness of breath and weakness.  Apparently she was hospitalized just 2 weeks ago and underwent excision of a mediastinal mass.  She was readmitted a day after being discharged with shortness of breath and hypoxia requiring diuresis.  During the second hospitalization she had small bilateral pleural effusions.  She has not been doing well at home since discharge and has been weaker and weaker and more hypoxic.  She was brought to the ER initially requiring nonrebreather then transition to 4 L nasal cannula.  She was found to have a moderate right-sided pleural effusion on the right side.  Subjective / 24h Interval events: She told me she was nauseous yesterday, but feeling better with her breathing  Assessment & Plan: Principal Problem Acute hypoxic respiratory failure due to right-sided pleural effusion -she has evidence of fluid overload with vasculature congestion on chest x-ray as well as a pleural effusion -Underwent a 2D echo which showed an EF of 65-70%, normal LVEF.  She did have grade 1 diastolic dysfunction.  RV systolic function is low normal, moderate elevated PA pressure at 58 -Seems to have responded to Lasix, continue for now, diuresing well and clinically improving -Unable to have thoracentesis done due to small pocket and unsafe procedure.  Diuresing  Active Problems Hyponatremia -significant, 119 on admission, suspect in the setting of fluid overload as she does appear hypervolemic.  Sodium improved Nausea, vomiting-patient had onset of nausea and vomiting yesterday afternoon.  She had a recurrent episode this morning, couple hours after she ate breakfast she had emesis which contained undigested food.   We will obtain a plain film for now, antiemetics  Acute on chronic diastolic CHF-fluid overloaded on admission with chest congestion on the chest x-ray, trace edema and right-sided pleural effusion, continue Lasix.  She is approaching euvolemia stage right now  Hyperthyroidism-TSH, T4, T3 unremarkable.  Continue  DM2-continue sliding scale  CBG (last 3)  Recent Labs    10/04/20 2140 10/05/20 0733 10/05/20 1159  GLUCAP 107* 119* 132*    Essential hypertension-continue amlodipine, atenolol, hold hydralazine  Hyperlipidemia-continue statin  Hyperkalemia-resolved  Acute kidney injury-borderline, creatinine back to normal  Scheduled Meds:  amLODipine  10 mg Oral Daily   aspirin EC  81 mg Oral Daily   atenolol  50 mg Oral BID   furosemide  40 mg Intravenous Daily   gabapentin  100 mg Oral QHS   insulin aspart  0-15 Units Subcutaneous TID WC   insulin aspart  0-5 Units Subcutaneous QHS   methimazole  2.5 mg Oral Daily   pantoprazole  40 mg Oral Q0600   polyethylene glycol  17 g Oral Daily   senna-docusate  1 tablet Oral BID   simvastatin  20 mg Oral QHS   sodium chloride flush  3 mL Intravenous Q12H   Continuous Infusions:  sodium chloride Stopped (10/01/20 1521)   PRN Meds:.acetaminophen **OR** acetaminophen, albuterol, hydrALAZINE, labetalol, morphine injection, ondansetron (ZOFRAN) IV, traMADol  Diet Orders (From admission, onward)     Start     Ordered   10/02/20 1949  Diet heart healthy/carb modified Room service appropriate? Yes; Fluid consistency: Thin; Fluid restriction: 2000 mL Fluid  Diet effective now       Question Answer Comment  Diet-HS Snack? Nothing  Room service appropriate? Yes   Fluid consistency: Thin   Fluid restriction: 2000 mL Fluid      10/02/20 1948            DVT prophylaxis: SCDs Start: 10/01/20 1837     Code Status: Full Code  Family Communication: Discussed with daughter at bedside  Status is: Inpatient  Remains inpatient  appropriate because:Inpatient level of care appropriate due to severity of illness  Dispo: The patient is from: Home              Anticipated d/c is to: Home              Patient currently is not medically stable to d/c.   Difficult to place patient No  Level of care: Telemetry  Consultants:  None  Procedures:  2D echo: pending Thoracentesis-pending  Microbiology  none  Antimicrobials: none    Objective: Vitals:   10/04/20 1150 10/04/20 1946 10/05/20 0505 10/05/20 0950  BP: 123/76 (!) 154/77 134/79 (!) 134/59  Pulse: 66 70 69 65  Resp: 18 18 20 20   Temp: 99.8 F (37.7 C) 98.6 F (37 C) 98 F (36.7 C) 98 F (36.7 C)  TempSrc: Oral Oral Oral Oral  SpO2: 96% 100% 100% 100%  Weight:      Height:        Intake/Output Summary (Last 24 hours) at 10/05/2020 1233 Last data filed at 10/05/2020 0746 Gross per 24 hour  Intake 240 ml  Output 300 ml  Net -60 ml    Filed Weights   10/01/20 1436 10/01/20 1827  Weight: 77.3 kg 79.3 kg    Examination:  Constitutional: No apparent distress Eyes: Anicteric ENMT: mmm Neck: normal, supple Respiratory: Clear to auscultation today, no wheezing, no crackles Cardiovascular: Regular rate and rhythm, no murmurs, trace edema Abdomen: Soft, nontender, nondistended, positive bowel sounds Musculoskeletal: no clubbing / cyanosis.  Skin: No rashes appreciated Neurologic: Nonfocal, equal strength  Data Reviewed: I have independently reviewed following labs and imaging studies   CBC: Recent Labs  Lab 10/01/20 1514 10/02/20 0402 10/03/20 0418 10/04/20 0438 10/05/20 0356  WBC 7.0 6.1 5.1 6.6 4.1  NEUTROABS 5.8 4.6 3.1 3.6 2.4  HGB 11.0* 9.8* 9.7* 10.0* 11.1*  HCT 32.1* 28.8* 28.8* 29.8* 33.8*  MCV 78.3* 78.0* 79.8* 79.5* 80.7  PLT 469* 398 418* 489* 470    Basic Metabolic Panel: Recent Labs  Lab 10/02/20 0402 10/02/20 1915 10/03/20 0418 10/04/20 0438 10/05/20 0356  NA 119* 127* 133* 133* 131*  K 3.7 4.4 3.8 3.7  3.5  CL 82* 83* 91* 88* 89*  CO2 28 30 34* 32 33*  GLUCOSE 121* 89 98 98 133*  BUN 11 9 10 11 13   CREATININE 1.04* 0.94 0.77 0.96 1.10*  CALCIUM 8.7* 8.7* 9.0 8.8* 8.4*  MG 1.9  --  1.8 2.0 1.8    Liver Function Tests: Recent Labs  Lab 10/01/20 1514 10/02/20 0402 10/03/20 0418 10/04/20 0438 10/05/20 0356  AST 33 16 16 17 17   ALT 7 7 7 8 8   ALKPHOS 42 38 38 40 37*  BILITOT 0.9 0.5 0.4 0.5 0.5  PROT 7.5 6.3* 6.4* 6.6 6.1*  ALBUMIN 4.0 3.5 3.3* 3.4* 3.3*    Coagulation Profile: No results for input(s): INR, PROTIME in the last 168 hours. HbA1C: No results for input(s): HGBA1C in the last 72 hours. CBG: Recent Labs  Lab 10/04/20 1126 10/04/20 1626 10/04/20 2140 10/05/20 0733 10/05/20 1159  GLUCAP 146* 127* 107* 119*  132*     Recent Results (from the past 240 hour(s))  Culture, blood (Routine X 2) w Reflex to ID Panel     Status: None (Preliminary result)   Collection Time: 10/01/20  3:16 PM   Specimen: BLOOD  Result Value Ref Range Status   Specimen Description   Final    BLOOD RIGHT ANTECUBITAL Performed at [facility] [address], [place], [place] [address]    Special Requests   Final    BOTTLES DRAWN AEROBIC AND ANAEROBIC Blood Culture adequate volume Performed at [facility] [address], [place], [place] [address]    Culture   Final    NO GROWTH 4 DAYS Performed at [facility], [place] [address] [place], [place] [address]    Report Status PENDING  Incomplete  SARS CORONAVIRUS 2 (TAT 6-24 HRS) Nasopharyngeal Nasopharyngeal Swab     Status: None   Collection Time: 10/01/20  3:17 PM   Specimen: Nasopharyngeal Swab  Result Value Ref Range Status   SARS Coronavirus 2 NEGATIVE NEGATIVE Final    Comment: (NOTE) SARS-CoV-2 target nucleic acids are NOT DETECTED.  The SARS-CoV-2 RNA is generally detectable in upper and lower respiratory specimens during the acute phase of infection. Negative results do  not preclude SARS-CoV-2 infection, do not rule out co-infections with other pathogens, and should not be used as the sole basis for treatment or other patient management decisions. Negative results must be combined with clinical observations, patient history, and epidemiological information. The expected result is Negative.  Fact Sheet for Patients: SugarRoll.be  Fact Sheet for Healthcare Providers: https://www.woods-mathews.com/  This test is not yet approved or cleared by the [place] FDA and  has been authorized for detection and/or diagnosis of SARS-CoV-2 by FDA under an Emergency Use Authorization (EUA). This EUA will remain  in effect (meaning this test can be used) for the duration of the COVID-19 declaration under Se ction 564(b)(1) of the Act, 21 U.S.C. section 360bbb-3(b)(1), unless the authorization is terminated or revoked sooner.  Performed at [facility], [place] [address], [place], [place] [address]   Culture, blood (Routine X 2) w Reflex to ID Panel     Status: None (Preliminary result)   Collection Time: 10/01/20  7:14 PM   Specimen: BLOOD  Result Value Ref Range Status   Specimen Description   Final    BLOOD LEFT ANTECUBITAL Performed at [facility] [address], [place], [place] [address]    Special Requests   Final    BOTTLES DRAWN AEROBIC ONLY Blood Culture results may not be optimal due to an inadequate volume of blood received in culture bottles Performed at [facility] [address], [place], [place] [address]    Culture   Final    NO GROWTH 4 DAYS Performed at [facility], [place] [address], [place],  [address]    Report Status PENDING  Incomplete      Radiology Studies: No results found.  Marzetta Board, MD, PhD [facility]  Between 7 am - 7 pm I am available, please contact me via Amion (for emergencies) or Securechat (non urgent  messages)  Between 7 pm - 7 am I am not available, please contact night coverage MD/APP via Amion

## 2020-10-06 ENCOUNTER — Inpatient Hospital Stay (HOSPITAL_COMMUNITY): Payer: Medicare HMO

## 2020-10-06 DIAGNOSIS — J9601 Acute respiratory failure with hypoxia: Secondary | ICD-10-CM | POA: Diagnosis not present

## 2020-10-06 DIAGNOSIS — J90 Pleural effusion, not elsewhere classified: Secondary | ICD-10-CM | POA: Diagnosis not present

## 2020-10-06 LAB — CBC WITH DIFFERENTIAL/PLATELET
Abs Immature Granulocytes: 0.02 10*3/uL (ref 0.00–0.07)
Basophils Absolute: 0 10*3/uL (ref 0.0–0.1)
Basophils Relative: 1 %
Eosinophils Absolute: 0.1 10*3/uL (ref 0.0–0.5)
Eosinophils Relative: 2 %
HCT: 28 % — ABNORMAL LOW (ref 36.0–46.0)
Hemoglobin: 9.4 g/dL — ABNORMAL LOW (ref 12.0–15.0)
Immature Granulocytes: 0 %
Lymphocytes Relative: 33 %
Lymphs Abs: 1.5 10*3/uL (ref 0.7–4.0)
MCH: 26.8 pg (ref 26.0–34.0)
MCHC: 33.6 g/dL (ref 30.0–36.0)
MCV: 79.8 fL — ABNORMAL LOW (ref 80.0–100.0)
Monocytes Absolute: 0.6 10*3/uL (ref 0.1–1.0)
Monocytes Relative: 14 %
Neutro Abs: 2.2 10*3/uL (ref 1.7–7.7)
Neutrophils Relative %: 50 %
Platelets: 398 10*3/uL (ref 150–400)
RBC: 3.51 MIL/uL — ABNORMAL LOW (ref 3.87–5.11)
RDW: 14.8 % (ref 11.5–15.5)
WBC: 4.5 10*3/uL (ref 4.0–10.5)
nRBC: 0 % (ref 0.0–0.2)

## 2020-10-06 LAB — COMPREHENSIVE METABOLIC PANEL
ALT: 8 U/L (ref 0–44)
AST: 12 U/L — ABNORMAL LOW (ref 15–41)
Albumin: 3.3 g/dL — ABNORMAL LOW (ref 3.5–5.0)
Alkaline Phosphatase: 37 U/L — ABNORMAL LOW (ref 38–126)
Anion gap: 8 (ref 5–15)
BUN: 11 mg/dL (ref 8–23)
CO2: 35 mmol/L — ABNORMAL HIGH (ref 22–32)
Calcium: 8.9 mg/dL (ref 8.9–10.3)
Chloride: 90 mmol/L — ABNORMAL LOW (ref 98–111)
Creatinine, Ser: 1.15 mg/dL — ABNORMAL HIGH (ref 0.44–1.00)
GFR, Estimated: 48 mL/min — ABNORMAL LOW (ref >60)
Glucose, Bld: 105 mg/dL — ABNORMAL HIGH (ref 70–99)
Potassium: 3.2 mmol/L — ABNORMAL LOW (ref 3.5–5.1)
Sodium: 133 mmol/L — ABNORMAL LOW (ref 135–145)
Total Bilirubin: 0.5 mg/dL (ref 0.3–1.2)
Total Protein: 6.2 g/dL — ABNORMAL LOW (ref 6.5–8.1)

## 2020-10-06 LAB — CULTURE, BLOOD (ROUTINE X 2)
Culture: NO GROWTH
Culture: NO GROWTH
Special Requests: ADEQUATE

## 2020-10-06 LAB — GLUCOSE, CAPILLARY
Glucose-Capillary: 117 mg/dL — ABNORMAL HIGH (ref 70–99)
Glucose-Capillary: 166 mg/dL — ABNORMAL HIGH (ref 70–99)
Glucose-Capillary: 94 mg/dL (ref 70–99)
Glucose-Capillary: 102 mg/dL — ABNORMAL HIGH (ref 70–99)

## 2020-10-06 LAB — MAGNESIUM: Magnesium: 1.8 mg/dL (ref 1.7–2.4)

## 2020-10-06 MED ORDER — POTASSIUM CHLORIDE CRYS ER 20 MEQ PO TBCR
40.0000 meq | EXTENDED_RELEASE_TABLET | Freq: Once | ORAL | Status: AC
  Administered 2020-10-06: 40 meq via ORAL
  Filled 2020-10-06: qty 2

## 2020-10-06 MED ORDER — FUROSEMIDE 10 MG/ML IJ SOLN
40.0000 mg | Freq: Two times a day (BID) | INTRAMUSCULAR | Status: DC
  Administered 2020-10-06 – 2020-10-08 (×5): 40 mg via INTRAVENOUS
  Filled 2020-10-06 (×5): qty 4

## 2020-10-06 MED ORDER — POTASSIUM CHLORIDE CRYS ER 20 MEQ PO TBCR
40.0000 meq | EXTENDED_RELEASE_TABLET | ORAL | Status: AC
  Administered 2020-10-06 (×2): 40 meq via ORAL
  Filled 2020-10-06 (×2): qty 2

## 2020-10-06 NOTE — Progress Notes (Signed)
Physical Therapy Treatment Patient Details Name: Mandy [name] MRN: [id number] DOB: 14-Nov-1940 Today's Date: 10/06/2020   History of Present Illness Mandy [name] is a 80 yo female presents due to not thriving well at home, ongoing weakness and SOB. 9/30 Thoracentesis not performed, effusion deemed not large enough to tap safely. She was recently admitted 9/15 - 9/21 (underwent excision mediastinal mass) and readmitted 9/22 - 9/24 with SOB/hypoxia requiring lasix for diuresis of small B/L pleural effusions. PMH: CHF, DMII, HLD, HTN, GERD, hyperthyroidism, arthritis, osteoporosis, diabetes    PT Comments    General bed mobility comments: OOB in bathroom with daughter assisting.  General transfer comment: 25% VCs for hand placement to power up and return to sitting, also assisted with a toilet transfer.  50% VC's on safety with turns.General Gait Details: 75% VCs on proper walker to self distance as pt tends to push too far out.  Daughter present and also assisted.  Pt present with limited activity tolerance.  Required a seated rest break after amb out of bathroom.  3/4 dyspnea.  Pt was on 1 lt nasal at 100% while seated on toilet.  Trial removed for RA.  During rest RA 96% but decreased to 85% with amb.  Instructed on deep breathing and applied 2 lts  increased to 92% with amb 45 feet. SATURATION QUALIFICATIONS: (This note is used to comply with regulatory documentation for home oxygen)  Patient Saturations on Room Air at Rest = 96%  Patient Saturations on Room Air while Ambulating 45 feet= 85%  Patient Saturations on 2 Liters of oxygen while Ambulating = 92%  Please briefly explain why patient needs home oxygen:  pt required supplemental oxygen with activity   Recommendations for follow up therapy are one component of a multi-disciplinary discharge planning process, led by the attending physician.  Recommendations may be updated based on patient status, additional functional criteria and  insurance authorization.  Follow Up Recommendations  Home health PT;Supervision for mobility/OOB     Equipment Recommendations  None recommended by PT    Recommendations for Other Services       Precautions / Restrictions Precautions Precautions: Fall Precaution Comments: monitor O2     Mobility  Bed Mobility               General bed mobility comments: OOB in bathroom with daughter assisting    Transfers Overall transfer level: Needs assistance Equipment used: Rolling walker (2 wheeled) Transfers: Sit to/from [facility] Sit to Stand: Min guard Stand pivot transfers: Min guard;Min assist       General transfer comment: 25% VCs for hand placement to power up and return to sitting, also assisted with a toilet transfer.  50% VC's on safety with turns.  Ambulation/Gait Ambulation/Gait assistance: Min guard;Min assist Gait Distance (Feet): 45 Feet Assistive device: Rolling walker (2 wheeled) Gait Pattern/deviations: Step-through pattern;Decreased stride length Gait velocity: decreased   General Gait Details: 75% VCs on proper walker to self distance as pt tends to push too far out.  Daughter present and also assisted.  Pt present with limited activity tolerance.  Required a seated rest break after amb out of bathroom.  3/4 dyspnea.  Pt was on 1 lt nasal at 100% while seated on toilet.  Trial removed for RA.  During rest RA 96% but decreased to 85% with amb.  Instructed on deep breathing and RA increased to 92% with amb 45 feet.   Stairs  Wheelchair Mobility    Modified Rankin (Stroke Patients Only)       Balance                                            Cognition Arousal/Alertness: Awake/alert Behavior During Therapy: WFL for tasks assessed/performed Overall Cognitive Status: Within Functional Limits for tasks assessed                                 General Comments: seems WNL and  daughter in room and feels baseline      Exercises      General Comments        Pertinent Vitals/Pain Pain Assessment: No/denies pain    Home Living                      Prior Function            PT Goals (current goals can now be found in the care plan section) Progress towards PT goals: Progressing toward goals    Frequency    Min 3X/week      PT Plan Current plan remains appropriate    Co-evaluation              AM-PAC PT "6 Clicks" Mobility   Outcome Measure  Help needed turning from your back to your side while in a flat bed without using bedrails?: A Little Help needed moving from lying on your back to sitting on the side of a flat bed without using bedrails?: A Little Help needed moving to and from a bed to a chair (including a wheelchair)?: A Little Help needed standing up from a chair using your arms (e.g., wheelchair or bedside chair)?: A Little Help needed to walk in hospital room?: A Little Help needed climbing 3-5 steps with a railing? : A Little 6 Click Score: 18    End of Session Equipment Utilized During Treatment: Gait belt;Oxygen Activity Tolerance: Patient limited by fatigue Patient left: in chair;with call bell/phone within reach;with family/visitor present   PT Visit Diagnosis: Other abnormalities of gait and mobility (R26.89)     Time: 1425-1450 PT Time Calculation (min) (ACUTE ONLY): 25 min  Charges:  $Gait Training: 8-22 mins $Therapeutic Activity: 8-22 mins                     Rica Koyanagi  PTA Acute  Rehabilitation Services Pager      [phone] Office      [phone]

## 2020-10-06 NOTE — Care Management Important Message (Signed)
Important Message  Patient Details IM Letter placed in Patient's room for Daughter. Name: Mandy [name] MRN: [id number] Date of Birth: 01-08-1940   Medicare Important Message Given:  Yes     Kerin Salen 10/06/2020, 12:33 PM

## 2020-10-06 NOTE — Progress Notes (Signed)
PROGRESS NOTE  Mandy [name] WHQ:[id number] DOB: 06-25-1940 DOA: 10/01/2020 PCP: [name], MD   LOS: 5 days   Brief Narrative / Interim history: 80 year old female with DM2, hyperlipidemia, hypertension, hypothyroidism, arthritis comes into the hospital with complaints of shortness of breath and weakness.  Apparently she was hospitalized just 2 weeks ago and underwent excision of a mediastinal mass.  She was readmitted a day after being discharged with shortness of breath and hypoxia requiring diuresis.  During the second hospitalization she had small bilateral pleural effusions.  She has not been doing well at home since discharge and has been weaker and weaker and more hypoxic.  She was brought to the ER initially requiring nonrebreather then transition to 4 L nasal cannula.  She was found to have a moderate right-sided pleural effusion on the right side.  Subjective / 24h Interval events: She appreciates improvement, she is feeling better this morning.  Assessment & Plan: Principal Problem Acute hypoxic respiratory failure due to right-sided pleural effusion, acute on chronic diastolic CHF-she has evidence of fluid overload with vasculature congestion on chest x-ray as well as a pleural effusion -Underwent a 2D echo which showed an EF of 65-70%, normal LVEF.  She did have grade 1 diastolic dysfunction.  RV systolic function is low normal, moderate elevated PA pressure at 58 -Seems to have responded to Lasix, continue for now, diuresing well, clinically improving however chest x-ray this morning still shows some congestion.  She still hypoxic ambulation, I will increase her Lasix to twice daily.  Weight has improved to 163 pounds from 174 on admission. -Arthrocentesis was attempted on admission for her right-sided pleural effusion however it was aborted due to very small pocket, felt to be unsafe  Active Problems Hyponatremia -significant, 119 on admission, suspect in the setting of fluid  overload as she does appear hypervolemic.  Sodium now improved with diuresis  Nausea, vomiting-patient had onset of nausea and vomiting Sunday afternoon.  She had a recurrent episode Monday morning, couple hours after she ate breakfast she had emesis which contained undigested food.  Plain abdominal x-ray unremarkable.  Daughter tells me that she has done this at home for quite some time.  Recommend small frequent meals.  No nausea or vomiting today, appears to have resolved  Hyperthyroidism-TSH, T4, T3 unremarkable.  Continue  DM2-continue sliding scale  CBG (last 3)  Recent Labs    10/05/20 2127 10/06/20 0749 10/06/20 1149  GLUCAP 129* 117* 166*    Essential hypertension-continue amlodipine, atenolol, hold hydralazine  Hyperlipidemia-continue statin  Hyperkalemia-resolved  Acute kidney injury-borderline, creatinine back to normal  Scheduled Meds:  amLODipine  10 mg Oral Daily   aspirin EC  81 mg Oral Daily   atenolol  50 mg Oral BID   furosemide  40 mg Intravenous BID   gabapentin  100 mg Oral QHS   insulin aspart  0-15 Units Subcutaneous TID WC   insulin aspart  0-5 Units Subcutaneous QHS   melatonin  3 mg Oral QHS   methimazole  2.5 mg Oral Daily   pantoprazole  40 mg Oral Q0600   polyethylene glycol  17 g Oral Daily   potassium chloride  40 mEq Oral Q3H   senna-docusate  1 tablet Oral BID   simvastatin  20 mg Oral QHS   sodium chloride flush  3 mL Intravenous Q12H   Continuous Infusions:  sodium chloride Stopped (10/01/20 1521)   PRN Meds:.acetaminophen **OR** acetaminophen, albuterol, hydrALAZINE, labetalol, morphine injection, ondansetron (ZOFRAN) IV, traMADol  Diet Orders (From admission, onward)     Start     Ordered   10/02/20 1949  Diet heart healthy/carb modified Room service appropriate? Yes; Fluid consistency: Thin; Fluid restriction: 2000 mL Fluid  Diet effective now       Question Answer Comment  Diet-HS Snack? Nothing   Room service appropriate?  Yes   Fluid consistency: Thin   Fluid restriction: 2000 mL Fluid      10/02/20 1948            DVT prophylaxis: SCDs Start: 10/01/20 1837     Code Status: Full Code  Family Communication: Discussed with daughter at bedside  Status is: Inpatient  Remains inpatient appropriate because:Inpatient level of care appropriate due to severity of illness  Dispo: The patient is from: Home              Anticipated d/c is to: Home              Patient currently is not medically stable to d/c.   Difficult to place patient No  Level of care: Telemetry  Consultants:  None  Procedures:  2D echo  Microbiology  none  Antimicrobials: none    Objective: Vitals:   10/06/20 0512 10/06/20 0549 10/06/20 0911 10/06/20 1003  BP:   139/70   Pulse:   69   Resp:   20   Temp:   98.3 F (36.8 C)   TempSrc:   Oral   SpO2: (!) 81% 95% 100%   Weight:    74.3 kg  Height:        Intake/Output Summary (Last 24 hours) at 10/06/2020 1251 Last data filed at 10/06/2020 0900 Gross per 24 hour  Intake 480 ml  Output 850 ml  Net -370 ml    Filed Weights   10/01/20 1436 10/01/20 1827 10/06/20 1003  Weight: 77.3 kg 79.3 kg 74.3 kg    Examination:  Constitutional: She is in no distress, sitting in chair Eyes: No scleral icterus ENMT: mmm Neck: normal, supple Respiratory: Faint bibasilar crackle, no wheezing, no rhonchi Cardiovascular: Regular rate and rhythm, no murmurs, trace edema Abdomen: Soft, nontender, nondistended, positive bowel sounds Musculoskeletal: no clubbing / cyanosis.  Skin: No rashes appreciated Neurologic: No focal deficits, equal strength  Data Reviewed: I have independently reviewed following labs and imaging studies   CBC: Recent Labs  Lab 10/02/20 0402 10/03/20 0418 10/04/20 0438 10/05/20 0356 10/06/20 0400  WBC 6.1 5.1 6.6 4.1 4.5  NEUTROABS 4.6 3.1 3.6 2.4 2.2  HGB 9.8* 9.7* 10.0* 11.1* 9.4*  HCT 28.8* 28.8* 29.8* 33.8* 28.0*  MCV 78.0* 79.8*  79.5* 80.7 79.8*  PLT 398 418* 489* 359 782    Basic Metabolic Panel: Recent Labs  Lab 10/02/20 0402 10/02/20 1915 10/03/20 0418 10/04/20 0438 10/05/20 0356 10/06/20 0400  NA 119* 127* 133* 133* 131* 133*  K 3.7 4.4 3.8 3.7 3.5 3.2*  CL 82* 83* 91* 88* 89* 90*  CO2 28 30 34* 32 33* 35*  GLUCOSE 121* 89 98 98 133* 105*  BUN 11 9 10 11 13 11   CREATININE 1.04* 0.94 0.77 0.96 1.10* 1.15*  CALCIUM 8.7* 8.7* 9.0 8.8* 8.4* 8.9  MG 1.9  --  1.8 2.0 1.8 1.8    Liver Function Tests: Recent Labs  Lab 10/02/20 0402 10/03/20 0418 10/04/20 0438 10/05/20 0356 10/06/20 0400  AST 16 16 17 17  12*  ALT 7 7 8 8 8   ALKPHOS 38 38 40 37* 37*  BILITOT  0.5 0.4 0.5 0.5 0.5  PROT 6.3* 6.4* 6.6 6.1* 6.2*  ALBUMIN 3.5 3.3* 3.4* 3.3* 3.3*    Coagulation Profile: No results for input(s): INR, PROTIME in the last 168 hours. HbA1C: No results for input(s): HGBA1C in the last 72 hours. CBG: Recent Labs  Lab 10/05/20 1159 10/05/20 1721 10/05/20 2127 10/06/20 0749 10/06/20 1149  GLUCAP 132* 165* 129* 117* 166*     Recent Results (from the past 240 hour(s))  Culture, blood (Routine X 2) w Reflex to ID Panel     Status: None   Collection Time: 10/01/20  3:16 PM   Specimen: BLOOD  Result Value Ref Range Status   Specimen Description   Final    BLOOD RIGHT ANTECUBITAL Performed at [facility], [place] [address], [place], [place] [address]    Special Requests   Final    BOTTLES DRAWN AEROBIC AND ANAEROBIC Blood Culture adequate volume Performed at [place] [address], [place], [place] [address]    Culture   Final    NO GROWTH 5 DAYS Performed at [name], [place] [address] [place], [place] [address]    Report Status 10/06/2020 FINAL  Final  SARS CORONAVIRUS 2 (TAT 6-24 HRS) Nasopharyngeal Nasopharyngeal Swab     Status: None   Collection Time: 10/01/20  3:17 PM   Specimen: Nasopharyngeal Swab  Result Value Ref Range Status    SARS Coronavirus 2 NEGATIVE NEGATIVE Final    Comment: (NOTE) SARS-CoV-2 target nucleic acids are NOT DETECTED.  The SARS-CoV-2 RNA is generally detectable in upper and lower respiratory specimens during the acute phase of infection. Negative results do not preclude SARS-CoV-2 infection, do not rule out co-infections with other pathogens, and should not be used as the sole basis for treatment or other patient management decisions. Negative results must be combined with clinical observations, patient history, and epidemiological information. The expected result is Negative.  Fact Sheet for Patients: SugarRoll.be  Fact Sheet for Healthcare Providers: https://www.woods-mathews.com/  This test is not yet approved or cleared by the [place] FDA and  has been authorized for detection and/or diagnosis of SARS-CoV-2 by FDA under an Emergency Use Authorization (EUA). This EUA will remain  in effect (meaning this test can be used) for the duration of the COVID-19 declaration under Se ction 564(b)(1) of the Act, 21 U.S.C. section 360bbb-3(b)(1), unless the authorization is terminated or revoked sooner.  Performed at [facility], [place] [address], [place], [place] [address]   Culture, blood (Routine X 2) w Reflex to ID Panel     Status: None   Collection Time: 10/01/20  7:14 PM   Specimen: BLOOD  Result Value Ref Range Status   Specimen Description   Final    BLOOD LEFT ANTECUBITAL Performed at [facility] [address], [place], [place] [address]    Special Requests   Final    BOTTLES DRAWN AEROBIC ONLY Blood Culture results may not be optimal due to an inadequate volume of blood received in culture bottles Performed at [facility] [address], [place], [place] [address]    Culture   Final    NO GROWTH 5 DAYS Performed at [facility], [place] [address], [place],  [address]     Report Status 10/06/2020 FINAL  Final      Radiology Studies: DG CHEST PORT 1 VIEW  Result Date: 10/06/2020 CLINICAL DATA:  Dyspnea. EXAM: PORTABLE CHEST  1 VIEW COMPARISON:  October 01, 2020. FINDINGS: Stable cardiomegaly. Stable mild central pulmonary vascular congestion. No pneumothorax. Stable right basilar atelectasis or infiltrate is noted with associated pleural effusion. Bony thorax is unremarkable. IMPRESSION: Stable cardiomegaly with mild central pulmonary vascular congestion. Stable right basilar opacity as described above. Electronically Signed   By: Marijo Conception M.D.   On: 10/06/2020 08:17    Marzetta Board, MD, PhD [facility]  Between 7 am - 7 pm I am available, please contact me via Amion (for emergencies) or Securechat (non urgent messages)  Between 7 pm - 7 am I am not available, please contact night coverage MD/APP via Amion

## 2020-10-06 NOTE — Progress Notes (Signed)
Pt ambulated in hall with RW and CGA. O2 sat on RA with ambulation dropped to 79%, rebounded to 96% on 2lnc with ambulation. Pt ambulated half lap around unit and assisted back to bed. O2 sat on 2lnc once back in bed is 96%.

## 2020-10-06 NOTE — Plan of Care (Signed)
  Problem: Clinical Measurements: Goal: Ability to maintain clinical measurements within normal limits will improve Outcome: Progressing Goal: Respiratory complications will improve Outcome: Progressing Goal: Cardiovascular complication will be avoided Outcome: Progressing   Problem: Education: Goal: Knowledge of General Education information will improve Description: Including pain rating scale, medication(s)/side effects and non-pharmacologic comfort measures Outcome: Progressing   Problem: Health Behavior/Discharge Planning: Goal: Ability to manage health-related needs will improve Outcome: Progressing   Problem: Clinical Measurements: Goal: Ability to maintain clinical measurements within normal limits will improve Outcome: Progressing Goal: Will remain free from infection Outcome: Progressing Goal: Diagnostic test results will improve Outcome: Progressing Goal: Respiratory complications will improve Outcome: Progressing Goal: Cardiovascular complication will be avoided Outcome: Progressing   Problem: Activity: Goal: Risk for activity intolerance will decrease Outcome: Progressing   Problem: Nutrition: Goal: Adequate nutrition will be maintained Outcome: Progressing   Problem: Pain Managment: Goal: General experience of comfort will improve Outcome: Progressing   Problem: Safety: Goal: Ability to remain free from injury will improve Outcome: Progressing

## 2020-10-07 DIAGNOSIS — J90 Pleural effusion, not elsewhere classified: Secondary | ICD-10-CM | POA: Diagnosis not present

## 2020-10-07 LAB — BASIC METABOLIC PANEL
Anion gap: 8 (ref 5–15)
BUN: 12 mg/dL (ref 8–23)
CO2: 33 mmol/L — ABNORMAL HIGH (ref 22–32)
Calcium: 9.2 mg/dL (ref 8.9–10.3)
Chloride: 90 mmol/L — ABNORMAL LOW (ref 98–111)
Creatinine, Ser: 1.18 mg/dL — ABNORMAL HIGH (ref 0.44–1.00)
GFR, Estimated: 47 mL/min — ABNORMAL LOW (ref >60)
Glucose, Bld: 95 mg/dL (ref 70–99)
Potassium: 4.4 mmol/L (ref 3.5–5.1)
Sodium: 131 mmol/L — ABNORMAL LOW (ref 135–145)

## 2020-10-07 LAB — CBC
HCT: 27.5 % — ABNORMAL LOW (ref 36.0–46.0)
Hemoglobin: 9.1 g/dL — ABNORMAL LOW (ref 12.0–15.0)
MCH: 26.8 pg (ref 26.0–34.0)
MCHC: 33.1 g/dL (ref 30.0–36.0)
MCV: 80.9 fL (ref 80.0–100.0)
Platelets: 395 10*3/uL (ref 150–400)
RBC: 3.4 MIL/uL — ABNORMAL LOW (ref 3.87–5.11)
RDW: 15 % (ref 11.5–15.5)
WBC: 4.1 10*3/uL (ref 4.0–10.5)
nRBC: 0 % (ref 0.0–0.2)

## 2020-10-07 LAB — GLUCOSE, CAPILLARY
Glucose-Capillary: 91 mg/dL (ref 70–99)
Glucose-Capillary: 138 mg/dL — ABNORMAL HIGH (ref 70–99)
Glucose-Capillary: 104 mg/dL — ABNORMAL HIGH (ref 70–99)
Glucose-Capillary: 125 mg/dL — ABNORMAL HIGH (ref 70–99)

## 2020-10-07 NOTE — Progress Notes (Signed)
Occupational Therapy Treatment Patient Details Name: Mandy [name] MRN: [id number] DOB: 11/07/1940 Today's Date: 10/07/2020   History of present illness Mandy [name] is a 80 yo female presents due to not thriving well at home, ongoing weakness and SOB. 9/30 Thoracentesis not performed, effusion deemed not large enough to tap safely. She was recently admitted 9/15 - 9/21 (underwent excision mediastinal mass) and readmitted 9/22 - 9/24 with SOB/hypoxia requiring lasix for diuresis of small B/L pleural effusions. PMH: CHF, DMII, HLD, HTN, GERD, hyperthyroidism, arthritis, osteoporosis, diabetes   OT comments  Patient found on 1 L [place] seated in chair. O2 sat on [place] and at rest 100%. Removed Day and o2 sat still 100% after 2 minutes.Patient ambulated to bathroom with RW and min guard from therapist to wash hands at sink and then returned to sitting on side of bed. O2 sat dropped to 81% and took 2 L [place] to recover to high 90s. Patient ambulated again to sink and back to recliner on 2 L [place] and o2 sat 95%. Patient had no specific complaints of shortness of breathe but did report a little pain in right side. Patient demonstrated ability to don socks and daughter reports patient ambulating with her to the bathroom. Expect that patient will not need OT services at discharge but will continue to follow acutely to maintain functional abilities.     Recommendations for follow up therapy are one component of a multi-disciplinary discharge planning process, led by the attending physician.  Recommendations may be updated based on patient status, additional functional criteria and insurance authorization.    Follow Up Recommendations  No OT follow up    Equipment Recommendations  None recommended by OT    Recommendations for Other Services      Precautions / Restrictions Precautions Precautions: Fall Precaution Comments: monitor O2, o2 sats drop on RA Restrictions Weight Bearing Restrictions: No        Mobility Bed Mobility               General bed mobility comments: up in chair    Transfers Overall transfer level: Needs assistance Equipment used: Rolling walker (2 wheeled) Transfers: Sit to/from Stand Sit to Stand: Min guard Stand pivot transfers: Min guard       General transfer comment: min guard to stand ambulate to bathroom x 2. Tends to keep walker too far forward needing verbals. No overt loss of balance.    Balance Overall balance assessment: Mild deficits observed, not formally tested                                         ADL either performed or assessed with clinical judgement   ADL Overall ADL's : Needs assistance/impaired     Grooming: Standing;Wash/dry hands Grooming Details (indicate cue type and reason): stood at sink to wash hands             Lower Body Dressing: Set up Lower Body Dressing Details (indicate cue type and reason): able to don socks using figure four method             Functional mobility during ADLs: Min guard General ADL Comments: min guard with RW     Vision Patient Visual Report: No change from baseline     Perception     Praxis      Cognition Arousal/Alertness: Awake/alert Behavior During Therapy: WFL for tasks assessed/performed Overall  Cognitive Status: Within Functional Limits for tasks assessed                                          Exercises     Shoulder Instructions       General Comments      Pertinent Vitals/ Pain       Pain Assessment: Faces Pain Location: R side abdomen Pain Descriptors / Indicators: Grimacing Pain Intervention(s): Limited activity within patient's tolerance  Home Living                                          Prior Functioning/Environment              Frequency  Min 1X/week        Progress Toward Goals  OT Goals(current goals can now be found in the care plan section)  Progress towards OT  goals: Progressing toward goals  Acute Rehab OT Goals Patient Stated Goal: walk without o2 sat dropping OT Goal Formulation: With family Time For Goal Achievement: 10/17/20 Potential to Achieve Goals: Good  Plan Frequency needs to be updated;Discharge plan needs to be updated    Co-evaluation          OT goals addressed during session: ADL's and self-care (activity tolerance)      AM-PAC OT "6 Clicks" Daily Activity     Outcome Measure   Help from another person eating meals?: None Help from another person taking care of personal grooming?: None Help from another person toileting, which includes using toliet, bedpan, or urinal?: A Little Help from another person bathing (including washing, rinsing, drying)?: A Little Help from another person to put on and taking off regular upper body clothing?: A Little Help from another person to put on and taking off regular lower body clothing?: A Little 6 Click Score: 20    End of Session Equipment Utilized During Treatment: Rolling walker;Oxygen  OT Visit Diagnosis: Unsteadiness on feet (R26.81);Muscle weakness (generalized) (M62.81)   Activity Tolerance Patient tolerated treatment well   Patient Left in chair;with call bell/phone within reach;with family/visitor present   Nurse Communication Mobility status        Time: 9030-0923 OT Time Calculation (min): 22 min  Charges: OT General Charges $OT Visit: 1 Visit OT Treatments $Self Care/Home Management : 8-22 mins  Mandy [name], OTR/L Mandy [name]  Office [phone] Pager: [place] 10/07/2020, 10:14 AM

## 2020-10-07 NOTE — Progress Notes (Signed)
PROGRESS NOTE    Mandy [name]   WUJ:[id number]  DOB: 04-05-1940  PCP: [name], MD    DOA: 10/01/2020 LOS: 6    Brief Narrative / Hospital Course to Date:   80 year old female with past medical history of type 2 diabetes, hypertension, hyperlipidemia, hypothyroidism and arthritis who presented to the hospital for complaints of generalized weakness and shortness of breath.  She hospital admission only 2 weeks ago for excision of a mediastinal mass.  She was readmitted the day after discharge with shortness of breath and hypoxia requiring diuresis.  During that second admission, noted to have small bilateral pleural effusions.  Reportedly not thriving well at home since discharge with progressive weakness and hypoxia.  On arrival to the ER this admission, requiring nonrebreather mask for supplemental oxygen, transition to 4 L/min nasal cannula.  Noted to have a moderate right-sided pleural effusion.  Thoracentesis was attempted but pocket of fluid was felt too small to safely perform.  Undergoing IV diuresis and has been weaned to 1 L/min nasal cannula O2.  Patient does not require supplemental O2 at baseline. She continues to have O2 desats with ambulation.  Assessment & Plan   Principal Problem:   Pleural effusion on right Active Problems:   Diabetes mellitus type 2, controlled (HCC)   Dyslipidemia   Essential hypertension   GERD   Acute respiratory failure with hypoxia (HCC)   Generalized weakness   Obesity (BMI 30-39.9)   Hyperthyroidism   Chronic diastolic CHF (congestive heart failure) (HCC)   Hyperkalemia   AKI (acute kidney injury) ([place])   Microcytic anemia   Acute respiratory failure with hypoxia secondary to moderate right pleural effusion and acute on chronic diastolic CHF -patient presented with evidence of fluid overload with vascular congestion on chest x-ray and pleural effusion.  Requiring supplemental oxygen without requirement for this at baseline.    Unsuccessful attempt at thoracentesis due to small pocket of fluid. Echo showed preserved EF 65 to 95% grade 1 diastolic dysfunction. Responding well to IV Lasix.  Weight trending down. 10/5: Weaned to 1 L/min O2.  OT noted desaturation to 81% with ambulation on room air, recovered to high 90s once back on 2 L/min --Continue IV Lasix 40 mg twice daily --Strict I/O's and daily weights --Monitor renal function electrolytes  Hyponatremia -present with sodium 115 on admission.  Suspected due to hypervolemia.  Sodium improving with diuresis.  Monitor BMP.  Nausea vomiting -reported on Sunday afternoon with recurrent episode on Monday morning couple hours after breakfast (emesis noted to be undigested food).  Abdominal film unremarkable.  Daughter reported this being an issue at home for quite a while. --Small frequent meals --Consider gastric emptying study as outpatient --Supportive care with antiemetics as needed  AKI - borderline.  Renal function returned to baseline. --Monitor BMP --Avoid dehydration and hypotension  Hyperkalemia - resolved. Monitor BMP.  Hyperthyroidism -TSH, T4 and T3 unremarkable. --Continue methimazole  Type 2 Diabetes - sliding scale Novolog  Essential HTN - continue amlodipine, atenolol. Hold hydralazine.  Hyperlipidemia - continue statin      Patient BMI: Body mass index is 33.08 kg/m.   DVT prophylaxis: SCDs Start: 10/01/20 1837   Diet:  Diet Orders (From admission, onward)     Start     Ordered   10/02/20 1949  Diet heart healthy/carb modified Room service appropriate? Yes; Fluid consistency: Thin; Fluid restriction: 2000 mL Fluid  Diet effective now       Question Answer Comment  Diet-HS Snack?  Nothing   Room service appropriate? Yes   Fluid consistency: Thin   Fluid restriction: 2000 mL Fluid      10/02/20 1948              Code Status: Full Code   Subjective 10/07/20    Patient seen up in recliner with daughter at bedside on  rounds today.  Daughter reported that with therapy earlier this morning patient was taken off oxygen and noted to desat to the low 80s when ambulating to the bathroom.  Recovered well once oxygen replaced.  Patient denies shortness of breath, chest pain, fevers chills nausea vomiting.  She denies other acute complaints.   Disposition Plan & Communication   Status is: Inpatient  Remains inpatient appropriate because:IV treatments appropriate due to intensity of illness or inability to take PO.  Continue IV diuresis  Dispo: The patient is from: Home              Anticipated d/c is to: Home with home health              Patient currently is not medically stable to d/c.   Difficult to place patient No   Family Communication: Daughter at bedside on rounds today 10/5   Consults, Procedures, Significant Events   Consultants:  None  Procedures:  2D echo  Antimicrobials:  Anti-infectives (From admission, onward)    None         Micro    Objective   Vitals:   10/06/20 0911 10/06/20 1003 10/06/20 1601 10/06/20 2205  BP: 139/70  140/65 (!) 125/56  Pulse: 69  69 68  Resp: 20  18 18   Temp: 98.3 F (36.8 C)  98.2 F (36.8 C) 98.9 F (37.2 C)  TempSrc: Oral  Oral Oral  SpO2: 100%  95% 100%  Weight:  74.3 kg    Height:       No intake or output data in the 24 hours ending 10/07/20 1434 Filed Weights   10/01/20 1436 10/01/20 1827 10/06/20 1003  Weight: 77.3 kg 79.3 kg 74.3 kg    Physical Exam:  General exam: awake, alert, no acute distress, obese Respiratory system: CTAB, no wheezes, rales or rhonchi, normal respiratory effort. Cardiovascular system: normal S1/S2, RRR, no pedal edema.   Gastrointestinal system: soft, NT, ND Central nervous system: A&O x3. no gross focal neurologic deficits, normal speech Extremities: moves all, no edema, normal tone Psychiatry: normal mood, congruent affect  Labs   Data Reviewed: I have personally reviewed following labs and  imaging studies  CBC: Recent Labs  Lab 10/02/20 0402 10/03/20 0418 10/04/20 0438 10/05/20 0356 10/06/20 0400 10/07/20 0401  WBC 6.1 5.1 6.6 4.1 4.5 4.1  NEUTROABS 4.6 3.1 3.6 2.4 2.2  --   HGB 9.8* 9.7* 10.0* 11.1* 9.4* 9.1*  HCT 28.8* 28.8* 29.8* 33.8* 28.0* 27.5*  MCV 78.0* 79.8* 79.5* 80.7 79.8* 80.9  PLT 398 418* 489* 359 398 144   Basic Metabolic Panel: Recent Labs  Lab 10/02/20 0402 10/02/20 1915 10/03/20 0418 10/04/20 0438 10/05/20 0356 10/06/20 0400 10/07/20 0401  NA 119*   < > 133* 133* 131* 133* 131*  K 3.7   < > 3.8 3.7 3.5 3.2* 4.4  CL 82*   < > 91* 88* 89* 90* 90*  CO2 28   < > 34* 32 33* 35* 33*  GLUCOSE 121*   < > 98 98 133* 105* 95  BUN 11   < > 10 11 13 11  12  CREATININE 1.04*   < > 0.77 0.96 1.10* 1.15* 1.18*  CALCIUM 8.7*   < > 9.0 8.8* 8.4* 8.9 9.2  MG 1.9  --  1.8 2.0 1.8 1.8  --    < > = values in this interval not displayed.   GFR: Estimated Creatinine Clearance: 33.9 mL/min (A) (by C-G formula based on SCr of 1.18 mg/dL (H)). Liver Function Tests: Recent Labs  Lab 10/02/20 0402 10/03/20 0418 10/04/20 0438 10/05/20 0356 10/06/20 0400  AST 16 16 17 17  12*  ALT 7 7 8 8 8   ALKPHOS 38 38 40 37* 37*  BILITOT 0.5 0.4 0.5 0.5 0.5  PROT 6.3* 6.4* 6.6 6.1* 6.2*  ALBUMIN 3.5 3.3* 3.4* 3.3* 3.3*   No results for input(s): LIPASE, AMYLASE in the last 168 hours. No results for input(s): AMMONIA in the last 168 hours. Coagulation Profile: No results for input(s): INR, PROTIME in the last 168 hours. Cardiac Enzymes: No results for input(s): CKTOTAL, CKMB, CKMBINDEX, TROPONINI in the last 168 hours. BNP (last 3 results) No results for input(s): PROBNP in the last 8760 hours. HbA1C: No results for input(s): HGBA1C in the last 72 hours. CBG: Recent Labs  Lab 10/06/20 1149 10/06/20 1629 10/06/20 2158 10/07/20 0734 10/07/20 1140  GLUCAP 166* 94 102* 91 138*   Lipid Profile: No results for input(s): CHOL, HDL, LDLCALC, TRIG, CHOLHDL,  LDLDIRECT in the last 72 hours. Thyroid Function Tests: No results for input(s): TSH, T4TOTAL, FREET4, T3FREE, THYROIDAB in the last 72 hours. Anemia Panel: No results for input(s): VITAMINB12, FOLATE, FERRITIN, TIBC, IRON, RETICCTPCT in the last 72 hours. Sepsis Labs: Recent Labs  Lab 10/01/20 1514  LATICACIDVEN 0.8    Recent Results (from the past 240 hour(s))  Culture, blood (Routine X 2) w Reflex to ID Panel     Status: None   Collection Time: 10/01/20  3:16 PM   Specimen: BLOOD  Result Value Ref Range Status   Specimen Description   Final    BLOOD RIGHT ANTECUBITAL Performed at [facility] [address] [place], [place] [address]    Special Requests   Final    BOTTLES DRAWN AEROBIC AND ANAEROBIC Blood Culture adequate volume Performed at [facility] [address], [place], [place] [address]    Culture   Final    NO GROWTH 5 DAYS Performed at [facility], [place] [address] [place], [place] [address]    Report Status 10/06/2020 FINAL  Final  SARS CORONAVIRUS 2 (TAT 6-24 HRS) Nasopharyngeal Nasopharyngeal Swab     Status: None   Collection Time: 10/01/20  3:17 PM   Specimen: Nasopharyngeal Swab  Result Value Ref Range Status   SARS Coronavirus 2 NEGATIVE NEGATIVE Final    Comment: (NOTE) SARS-CoV-2 target nucleic acids are NOT DETECTED.  The SARS-CoV-2 RNA is generally detectable in upper and lower respiratory specimens during the acute phase of infection. Negative results do not preclude SARS-CoV-2 infection, do not rule out co-infections with other pathogens, and should not be used as the sole basis for treatment or other patient management decisions. Negative results must be combined with clinical observations, patient history, and epidemiological information. The expected result is Negative.  Fact Sheet for Patients: SugarRoll.be  Fact Sheet for Healthcare  Providers: https://www.woods-mathews.com/  This test is not yet approved or cleared by the [place] FDA and  has been authorized for detection and/or diagnosis of SARS-CoV-2 by FDA under an Emergency Use Authorization (EUA). This EUA  will remain  in effect (meaning this test can be used) for the duration of the COVID-19 declaration under Se ction 564(b)(1) of the Act, 21 U.S.C. section 360bbb-3(b)(1), unless the authorization is terminated or revoked sooner.  Performed at [facility], [place] [address], [place], [place] [address]   Culture, blood (Routine X 2) w Reflex to ID Panel     Status: None   Collection Time: 10/01/20  7:14 PM   Specimen: BLOOD  Result Value Ref Range Status   Specimen Description   Final    BLOOD LEFT ANTECUBITAL Performed at [facility] [address], [place], [place] [address]    Special Requests   Final    BOTTLES DRAWN AEROBIC ONLY Blood Culture results may not be optimal due to an inadequate volume of blood received in culture bottles Performed at [facility] [address] [place], [place] [address]    Culture   Final    NO GROWTH 5 DAYS Performed at [facility], [place] [address], [place], [place] [address]    Report Status 10/06/2020 FINAL  Final      Imaging Studies   DG CHEST PORT 1 VIEW  Result Date: 10/06/2020 CLINICAL DATA:  Dyspnea. EXAM: PORTABLE CHEST 1 VIEW COMPARISON:  October 01, 2020. FINDINGS: Stable cardiomegaly. Stable mild central pulmonary vascular congestion. No pneumothorax. Stable right basilar atelectasis or infiltrate is noted with associated pleural effusion. Bony thorax is unremarkable. IMPRESSION: Stable cardiomegaly with mild central pulmonary vascular congestion. Stable right basilar opacity as described above. Electronically Signed   By: Marijo Conception M.D.   On: 10/06/2020 08:17     Medications   Scheduled Meds:  amLODipine  10 mg Oral Daily    aspirin EC  81 mg Oral Daily   atenolol  50 mg Oral BID   furosemide  40 mg Intravenous BID   gabapentin  100 mg Oral QHS   insulin aspart  0-15 Units Subcutaneous TID WC   insulin aspart  0-5 Units Subcutaneous QHS   methimazole  2.5 mg Oral Daily   pantoprazole  40 mg Oral Q0600   polyethylene glycol  17 g Oral Daily   senna-docusate  1 tablet Oral BID   simvastatin  20 mg Oral QHS   sodium chloride flush  3 mL Intravenous Q12H   Continuous Infusions:  sodium chloride Stopped (10/01/20 1521)       LOS: 6 days    Time spent: 30 minutes    Ezekiel Slocumb, DO [facility]  10/07/2020, 2:34 PM      If 7PM-7AM, please contact night-coverage. How to contact the [facility] Attending or Consulting provider [place] or covering provider during after hours [place], for this patient?    Check the care team in [facility] and look for a) attending/consulting [facility] provider listed and b) the [facility] team listed Log into [email or web address] and use [place]'s universal password to access. If you do not have the password, please contact the hospital operator. Locate the [facility] provider you are looking for under [facility] and page to a number that you can be directly reached. If you still have difficulty reaching the provider, please page the [facility] (Director on Call) for the Hospitalists listed on amion for assistance.

## 2020-10-08 ENCOUNTER — Inpatient Hospital Stay (HOSPITAL_COMMUNITY): Payer: Medicare HMO

## 2020-10-08 DIAGNOSIS — J90 Pleural effusion, not elsewhere classified: Secondary | ICD-10-CM | POA: Diagnosis not present

## 2020-10-08 LAB — BASIC METABOLIC PANEL
Anion gap: 7 (ref 5–15)
BUN: 13 mg/dL (ref 8–23)
CO2: 32 mmol/L (ref 22–32)
Calcium: 8.9 mg/dL (ref 8.9–10.3)
Chloride: 90 mmol/L — ABNORMAL LOW (ref 98–111)
Creatinine, Ser: 1.05 mg/dL — ABNORMAL HIGH (ref 0.44–1.00)
GFR, Estimated: 54 mL/min — ABNORMAL LOW (ref >60)
Glucose, Bld: 153 mg/dL — ABNORMAL HIGH (ref 70–99)
Potassium: 3.6 mmol/L (ref 3.5–5.1)
Sodium: 129 mmol/L — ABNORMAL LOW (ref 135–145)

## 2020-10-08 LAB — GLUCOSE, CAPILLARY
Glucose-Capillary: 116 mg/dL — ABNORMAL HIGH (ref 70–99)
Glucose-Capillary: 152 mg/dL — ABNORMAL HIGH (ref 70–99)
Glucose-Capillary: 83 mg/dL (ref 70–99)
Glucose-Capillary: 126 mg/dL — ABNORMAL HIGH (ref 70–99)

## 2020-10-08 LAB — MAGNESIUM: Magnesium: 1.6 mg/dL — ABNORMAL LOW (ref 1.7–2.4)

## 2020-10-08 MED ORDER — HYDRALAZINE HCL 50 MG PO TABS
50.0000 mg | ORAL_TABLET | Freq: Three times a day (TID) | ORAL | Status: DC
  Administered 2020-10-08 – 2020-10-09 (×4): 50 mg via ORAL
  Filled 2020-10-08 (×4): qty 1

## 2020-10-08 MED ORDER — POTASSIUM CHLORIDE CRYS ER 20 MEQ PO TBCR
40.0000 meq | EXTENDED_RELEASE_TABLET | Freq: Once | ORAL | Status: AC
  Administered 2020-10-08: 40 meq via ORAL
  Filled 2020-10-08: qty 2

## 2020-10-08 MED ORDER — MAGNESIUM SULFATE 2 GM/50ML IV SOLN
2.0000 g | Freq: Once | INTRAVENOUS | Status: AC
  Administered 2020-10-08: 2 g via INTRAVENOUS
  Filled 2020-10-08: qty 50

## 2020-10-08 NOTE — Progress Notes (Signed)
Physical Therapy Treatment Patient Details Name: Mandy [name] MRN: [id number] DOB: Mar 20, 1940 Today's Date: 10/08/2020   History of Present Illness Mandy [name] is a 80 yo female presents due to not thriving well at home, ongoing weakness and SOB. 9/30 Thoracentesis not performed, effusion deemed not large enough to tap safely. She was recently admitted 9/15 - 9/21 (underwent excision mediastinal mass) and readmitted 9/22 - 9/24 with SOB/hypoxia requiring lasix for diuresis of small B/L pleural effusions. PMH: CHF, DMII, HLD, HTN, GERD, hyperthyroidism, arthritis, osteoporosis, diabetes    PT Comments    Pt already bathed in bathroom with daughter assisting this morning.  Pt assisted with ambulating in hallway and daughter agreeable to check saturations on room air however pt still requires supplemental oxygen for exertional tasks.  SATURATION QUALIFICATIONS: (This note is used to comply with regulatory documentation for home oxygen)  Patient Saturations on Room Air at Rest = 98%  Patient Saturations on Room Air while Ambulating = 78%  Patient Saturations on 2 Liters of oxygen while Ambulating = 95%  Please briefly explain why patient needs home oxygen: To improve oxygen saturations during physical activities such as ADLs and ambulation    Recommendations for follow up therapy are one component of a multi-disciplinary discharge planning process, led by the attending physician.  Recommendations may be updated based on patient status, additional functional criteria and insurance authorization.  Follow Up Recommendations  Home health PT;Supervision for mobility/OOB     Equipment Recommendations  None recommended by PT    Recommendations for Other Services       Precautions / Restrictions Precautions Precautions: Fall Precaution Comments: monitor O2, o2 sats drop on RA     Mobility  Bed Mobility               General bed mobility comments: pt in recliner     Transfers Overall transfer level: Needs assistance Equipment used: Rolling walker (2 wheeled) Transfers: Sit to/from Stand Sit to Stand: Min guard         General transfer comment: cues for RW positioning  Ambulation/Gait Ambulation/Gait assistance: Min guard Gait Distance (Feet): 120 Feet Assistive device: Rolling walker (2 wheeled) Gait Pattern/deviations: Step-through pattern;Decreased stride length     General Gait Details: cues for RW positioning and taking rest breaks, pt reports fatigue, SPO2 dropped to 78% on room air, improved to lower 90s with rest break and breathing on 2L O2 Mandy [name]   Stairs             Wheelchair Mobility    Modified Rankin (Stroke Patients Only)       Balance                                            Cognition Arousal/Alertness: Awake/alert Behavior During Therapy: WFL for tasks assessed/performed Overall Cognitive Status: Within Functional Limits for tasks assessed                                        Exercises      General Comments        Pertinent Vitals/Pain Pain Assessment: No/denies pain Pain Intervention(s): Monitored during session;Repositioned    Home Living  Prior Function            PT Goals (current goals can now be found in the care plan section) Progress towards PT goals: Progressing toward goals    Frequency    Min 3X/week      PT Plan Current plan remains appropriate    Co-evaluation              AM-PAC PT "6 Clicks" Mobility   Outcome Measure  Help needed turning from your back to your side while in a flat bed without using bedrails?: A Little Help needed moving from lying on your back to sitting on the side of a flat bed without using bedrails?: A Little Help needed moving to and from a bed to a chair (including a wheelchair)?: A Little Help needed standing up from a chair using your arms (e.g., wheelchair or  bedside chair)?: A Little Help needed to walk in hospital room?: A Little Help needed climbing 3-5 steps with a railing? : A Little 6 Click Score: 18    End of Session Equipment Utilized During Treatment: Gait belt;Oxygen Activity Tolerance: Patient tolerated treatment well Patient left: in chair;with call bell/phone within reach;with family/visitor present   PT Visit Diagnosis: Other abnormalities of gait and mobility (R26.89)     Time: 1139-1150 PT Time Calculation (min) (ACUTE ONLY): 11 min  Charges:  $Gait Training: 8-22 mins                     Arlyce Dice, DPT Acute Rehabilitation Services Pager: [phone] Office: [place] 10/08/2020, 3:50 PM

## 2020-10-08 NOTE — Progress Notes (Addendum)
PROGRESS NOTE    Mandy [name]   NOB:[id number]  DOB: Jan 29, 1940  PCP: [name], MD    DOA: 10/01/2020 LOS: 7    Brief Narrative / Hospital Course to Date:   80 year old female with past medical history of type 2 diabetes, hypertension, hyperlipidemia, hypothyroidism and arthritis who presented to the hospital for complaints of generalized weakness and shortness of breath.  She hospital admission only 2 weeks ago for excision of a mediastinal mass.  She was readmitted the day after discharge with shortness of breath and hypoxia requiring diuresis.  During that second admission, noted to have small bilateral pleural effusions.  Reportedly not thriving well at home since discharge with progressive weakness and hypoxia.  On arrival to the ER this admission, requiring nonrebreather mask for supplemental oxygen, transition to 4 L/min nasal cannula.  Noted to have a moderate right-sided pleural effusion.  Thoracentesis was attempted but pocket of fluid was felt too small to safely perform.  Undergoing IV diuresis and has been weaned to 1 L/min nasal cannula O2.  Patient does not require supplemental O2 at baseline. She continues to have O2 desats with ambulation.  Assessment & Plan   Principal Problem:   Pleural effusion on right Active Problems:   Diabetes mellitus type 2, controlled (HCC)   Dyslipidemia   Essential hypertension   GERD   Acute respiratory failure with hypoxia (HCC)   Generalized weakness   Obesity (BMI 30-39.9)   Hyperthyroidism   Chronic diastolic CHF (congestive heart failure) (HCC)   Hyperkalemia   AKI (acute kidney injury) ([place])   Microcytic anemia   Acute respiratory failure with hypoxia secondary to moderate right pleural effusion and acute on chronic diastolic CHF -patient presented with evidence of fluid overload with vascular congestion on chest x-ray and pleural effusion.  Requiring supplemental oxygen without requirement for this at baseline.    Unsuccessful attempt at thoracentesis due to small pocket of fluid. Echo showed preserved EF 65 to 94% grade 1 diastolic dysfunction. Responding well to IV Lasix.  Weight trending down. 10/6: Patient continues to have O2 desaturations.  Renal function stable and improving --Continue IV Lasix 40 mg twice daily --Chest x-ray to reassess pleural effusion --Strict I/O's and daily weights --Monitor renal function electrolytes  Hyponatremia -present with sodium 115 on admission.  Suspected due to hypervolemia.  Sodium improving with diuresis.  Monitor BMP.  Nausea vomiting -reported on Sunday afternoon with recurrent episode on Monday morning couple hours after breakfast (emesis noted to be undigested food).  Abdominal film unremarkable.  Daughter reported this being an issue at home for quite a while. --Small frequent meals --Consider gastric emptying study as outpatient --Supportive care with antiemetics as needed  AKI - borderline.  Renal function returned to baseline. --Monitor BMP --Avoid dehydration and hypotension  Hyperkalemia - resolved. Monitor BMP.  Hyperthyroidism -TSH, T4 and T3 unremarkable. --Continue methimazole  Type 2 Diabetes - sliding scale Novolog  Essential HTN - continue amlodipine, atenolol. Hold hydralazine.  Hyperlipidemia - continue statin      Patient BMI: Body mass index is 33.08 kg/m.   DVT prophylaxis: SCDs Start: 10/01/20 1837   Diet:  Diet Orders (From admission, onward)     Start     Ordered   10/02/20 1949  Diet heart healthy/carb modified Room service appropriate? Yes; Fluid consistency: Thin; Fluid restriction: 2000 mL Fluid  Diet effective now       Question Answer Comment  Diet-HS Snack? Nothing   Room service appropriate? Yes  Fluid consistency: Thin   Fluid restriction: 2000 mL Fluid      10/02/20 1948              Code Status: Full Code   Subjective 10/08/20    Patient up in recliner when seen on rounds today.  She  had just finished eating lunch.  She reports feeling generally weak.  Has some mild shortness of breath still with exertion.  Daughter reports that patient got up on her own to go to the bathroom overnight and took off her oxygen and noted to desaturate again.  No other acute events or complaints reported.   Disposition Plan & Communication   Status is: Inpatient  Remains inpatient appropriate because:IV treatments appropriate due to intensity of illness or inability to take PO.  Continue IV diuresis  Dispo: The patient is from: Home              Anticipated d/c is to: Home with home health              Patient currently is not medically stable to d/c.   Difficult to place patient No   Family Communication: Daughter at bedside on rounds today 10/6   Consults, Procedures, Significant Events   Consultants:  None  Procedures:  2D echo  Antimicrobials:  Anti-infectives (From admission, onward)    None         Micro    Objective   Vitals:   10/07/20 2232 10/08/20 0442 10/08/20 0719 10/08/20 1319  BP: (!) 167/69 (!) 153/70  129/63  Pulse: 66 66  69  Resp: 17 17    Temp: 98.3 F (36.8 C) 98.2 F (36.8 C)  97.9 F (36.6 C)  TempSrc: Oral Oral  Oral  SpO2: 100% 100% 100% 100%  Weight:      Height:       No intake or output data in the 24 hours ending 10/08/20 1610 Filed Weights   10/01/20 1436 10/01/20 1827 10/06/20 1003  Weight: 77.3 kg 79.3 kg 74.3 kg    Physical Exam:  General exam: awake, alert, no acute distress, obese Respiratory system: CTAB with diminished right base, no wheezes, rales or rhonchi, normal respiratory effort, on 2 L/min nasal cannula oxygen. Cardiovascular system: normal S1/S2, RRR, no peripheral edema.   Central nervous system: A&O x3. no gross focal neurologic deficits, normal speech Psychiatry: normal mood, congruent affect  Labs   Data Reviewed: I have personally reviewed following labs and imaging studies  CBC: Recent Labs   Lab 10/02/20 0402 10/03/20 0418 10/04/20 0438 10/05/20 0356 10/06/20 0400 10/07/20 0401  WBC 6.1 5.1 6.6 4.1 4.5 4.1  NEUTROABS 4.6 3.1 3.6 2.4 2.2  --   HGB 9.8* 9.7* 10.0* 11.1* 9.4* 9.1*  HCT 28.8* 28.8* 29.8* 33.8* 28.0* 27.5*  MCV 78.0* 79.8* 79.5* 80.7 79.8* 80.9  PLT 398 418* 489* 359 398 656   Basic Metabolic Panel: Recent Labs  Lab 10/03/20 0418 10/04/20 0438 10/05/20 0356 10/06/20 0400 10/07/20 0401 10/08/20 0351  NA 133* 133* 131* 133* 131* 129*  K 3.8 3.7 3.5 3.2* 4.4 3.6  CL 91* 88* 89* 90* 90* 90*  CO2 34* 32 33* 35* 33* 32  GLUCOSE 98 98 133* 105* 95 153*  BUN 10 11 13 11 12 13   CREATININE 0.77 0.96 1.10* 1.15* 1.18* 1.05*  CALCIUM 9.0 8.8* 8.4* 8.9 9.2 8.9  MG 1.8 2.0 1.8 1.8  --  1.6*   GFR: Estimated Creatinine Clearance: 38.1 mL/min (  A) (by C-G formula based on SCr of 1.05 mg/dL (H)). Liver Function Tests: Recent Labs  Lab 10/02/20 0402 10/03/20 0418 10/04/20 0438 10/05/20 0356 10/06/20 0400  AST 16 16 17 17  12*  ALT 7 7 8 8 8   ALKPHOS 38 38 40 37* 37*  BILITOT 0.5 0.4 0.5 0.5 0.5  PROT 6.3* 6.4* 6.6 6.1* 6.2*  ALBUMIN 3.5 3.3* 3.4* 3.3* 3.3*   No results for input(s): LIPASE, AMYLASE in the last 168 hours. No results for input(s): AMMONIA in the last 168 hours. Coagulation Profile: No results for input(s): INR, PROTIME in the last 168 hours. Cardiac Enzymes: No results for input(s): CKTOTAL, CKMB, CKMBINDEX, TROPONINI in the last 168 hours. BNP (last 3 results) No results for input(s): PROBNP in the last 8760 hours. HbA1C: No results for input(s): HGBA1C in the last 72 hours. CBG: Recent Labs  Lab 10/07/20 1140 10/07/20 1658 10/07/20 2241 10/08/20 0713 10/08/20 1116  GLUCAP 138* 104* 125* 116* 152*   Lipid Profile: No results for input(s): CHOL, HDL, LDLCALC, TRIG, CHOLHDL, LDLDIRECT in the last 72 hours. Thyroid Function Tests: No results for input(s): TSH, T4TOTAL, FREET4, T3FREE, THYROIDAB in the last 72 hours. Anemia  Panel: No results for input(s): VITAMINB12, FOLATE, FERRITIN, TIBC, IRON, RETICCTPCT in the last 72 hours. Sepsis Labs: No results for input(s): PROCALCITON, LATICACIDVEN in the last 168 hours.   Recent Results (from the past 240 hour(s))  Culture, blood (Routine X 2) w Reflex to ID Panel     Status: None   Collection Time: 10/01/20  3:16 PM   Specimen: BLOOD  Result Value Ref Range Status   Specimen Description   Final    BLOOD RIGHT ANTECUBITAL Performed at [facility] [address], [place], [place] [address]    Special Requests   Final    BOTTLES DRAWN AEROBIC AND ANAEROBIC Blood Culture adequate volume Performed at [facility] [address] [place], [place] [address]    Culture   Final    NO GROWTH 5 DAYS Performed at [facility], [place] [address], [place], [place] [address]    Report Status 10/06/2020 FINAL  Final  SARS CORONAVIRUS 2 (TAT 6-24 HRS) Nasopharyngeal Nasopharyngeal Swab     Status: None   Collection Time: 10/01/20  3:17 PM   Specimen: Nasopharyngeal Swab  Result Value Ref Range Status   SARS Coronavirus 2 NEGATIVE NEGATIVE Final    Comment: (NOTE) SARS-CoV-2 target nucleic acids are NOT DETECTED.  The SARS-CoV-2 RNA is generally detectable in upper and lower respiratory specimens during the acute phase of infection. Negative results do not preclude SARS-CoV-2 infection, do not rule out co-infections with other pathogens, and should not be used as the sole basis for treatment or other patient management decisions. Negative results must be combined with clinical observations, patient history, and epidemiological information. The expected result is Negative.  Fact Sheet for Patients: SugarRoll.be  Fact Sheet for Healthcare Providers: https://www.woods-mathews.com/  This test is not yet approved or cleared by the [place] FDA and  has been authorized for  detection and/or diagnosis of SARS-CoV-2 by FDA under an Emergency Use Authorization (EUA). This EUA will remain  in effect (meaning this test can be used) for the duration of the COVID-19 declaration under Se ction 564(b)(1) of the Act, 21 U.S.C. section 360bbb-3(b)(1), unless the authorization is terminated or revoked sooner.  Performed at [facility], [place] [address] [place], [place] [address]   Culture,  blood (Routine X 2) w Reflex to ID Panel     Status: None   Collection Time: 10/01/20  7:14 PM   Specimen: BLOOD  Result Value Ref Range Status   Specimen Description   Final    BLOOD LEFT ANTECUBITAL Performed at [facility] [address], [place], [place] [address]    Special Requests   Final    BOTTLES DRAWN AEROBIC ONLY Blood Culture results may not be optimal due to an inadequate volume of blood received in culture bottles Performed at [facility] [address], [place], [place] [address]    Culture   Final    NO GROWTH 5 DAYS Performed at [facility], [place] [address] [place], [place] [address]    Report Status 10/06/2020 FINAL  Final      Imaging Studies   DG CHEST PORT 1 VIEW  Result Date: 10/08/2020 CLINICAL DATA:  Acute respiratory failure with hypoxia. EXAM: PORTABLE CHEST 1 VIEW COMPARISON:  10/06/2020 FINDINGS: The cardiac silhouette remains enlarged with mild central pulmonary vascular congestion. Right lower lung opacity is unchanged and consistent with a combination of pleural fluid and consolidation or atelectasis. Minimal atelectasis or scarring is noted in the left lung. No pneumothorax is identified. Degenerative changes are noted at the shoulders. IMPRESSION: Unchanged right pleural effusion and atelectasis or consolidation. Electronically Signed   By: Logan Bores M.D.   On: 10/08/2020 15:10     Medications   Scheduled Meds:  amLODipine  10 mg Oral Daily   aspirin EC  81 mg Oral Daily    atenolol  50 mg Oral BID   furosemide  40 mg Intravenous BID   gabapentin  100 mg Oral QHS   hydrALAZINE  50 mg Oral Q8H   insulin aspart  0-15 Units Subcutaneous TID WC   insulin aspart  0-5 Units Subcutaneous QHS   methimazole  2.5 mg Oral Daily   pantoprazole  40 mg Oral Q0600   polyethylene glycol  17 g Oral Daily   senna-docusate  1 tablet Oral BID   simvastatin  20 mg Oral QHS   sodium chloride flush  3 mL Intravenous Q12H   Continuous Infusions:  sodium chloride Stopped (10/01/20 1521)       LOS: 7 days    Time spent: 25 minutes with > 50% spent at bedside and in coordination of care     Ezekiel Slocumb, DO [facility]  10/08/2020, 4:10 PM      If 7PM-7AM, please contact night-coverage. How to contact the [facility] Attending or Consulting provider [place] or covering provider during after hours [place], for this patient?    Check the care team in [facility] and look for a) attending/consulting TR[facility] provider listed and b) the [facility] team listed Log into [email or web address] and use [place]'s universal password to access. If you do not have the password, please contact the hospital operator. Locate the [facility] provider you are looking for under [facility] and page to a number that you can be directly reached. If you still have difficulty reaching the provider, please page the [facility] (Director on Call) for the Hospitalists listed on amion for assistance.

## 2020-10-09 DIAGNOSIS — J90 Pleural effusion, not elsewhere classified: Secondary | ICD-10-CM | POA: Diagnosis not present

## 2020-10-09 LAB — BASIC METABOLIC PANEL
Anion gap: 7 (ref 5–15)
BUN: 17 mg/dL (ref 8–23)
CO2: 34 mmol/L — ABNORMAL HIGH (ref 22–32)
Calcium: 9.3 mg/dL (ref 8.9–10.3)
Chloride: 92 mmol/L — ABNORMAL LOW (ref 98–111)
Creatinine, Ser: 1.11 mg/dL — ABNORMAL HIGH (ref 0.44–1.00)
GFR, Estimated: 51 mL/min — ABNORMAL LOW (ref >60)
Glucose, Bld: 102 mg/dL — ABNORMAL HIGH (ref 70–99)
Potassium: 3.7 mmol/L (ref 3.5–5.1)
Sodium: 133 mmol/L — ABNORMAL LOW (ref 135–145)

## 2020-10-09 LAB — MAGNESIUM: Magnesium: 2.2 mg/dL (ref 1.7–2.4)

## 2020-10-09 LAB — GLUCOSE, CAPILLARY
Glucose-Capillary: 114 mg/dL — ABNORMAL HIGH (ref 70–99)
Glucose-Capillary: 145 mg/dL — ABNORMAL HIGH (ref 70–99)

## 2020-10-09 MED ORDER — FUROSEMIDE 40 MG PO TABS: 40.0000 mg | ORAL_TABLET | Freq: Two times a day (BID) | ORAL | 1 refills | Status: DC

## 2020-10-09 MED ORDER — FUROSEMIDE 40 MG PO TABS
40.0000 mg | ORAL_TABLET | Freq: Two times a day (BID) | ORAL | Status: DC
  Administered 2020-10-09 (×2): 40 mg via ORAL
  Filled 2020-10-09 (×2): qty 1

## 2020-10-09 MED ORDER — HYDRALAZINE HCL 100 MG PO TABS: 50.0000 mg | ORAL_TABLET | Freq: Three times a day (TID) | ORAL | 1 refills | Status: DC

## 2020-10-09 NOTE — Plan of Care (Signed)
  Problem: Clinical Measurements: Goal: Respiratory complications will improve Outcome: Adequate for Discharge   Problem: Clinical Measurements: Goal: Respiratory complications will improve Outcome: Adequate for Discharge   Problem: Activity: Goal: Risk for activity intolerance will decrease Outcome: Adequate for Discharge   Problem: Nutrition: Goal: Adequate nutrition will be maintained Outcome: Adequate for Discharge   Problem: Safety: Goal: Ability to remain free from injury will improve Outcome: Adequate for Discharge

## 2020-10-09 NOTE — Care Management Important Message (Signed)
Important Message  Patient Details IM Letter placed in the Patients room Name: Deborrah Mabin MRN: [id number] Date of Birth: 24-Dec-1940   Medicare Important Message Given:  Yes     Kerin Salen 10/09/2020, 11:04 AM

## 2020-10-09 NOTE — Plan of Care (Signed)
  Problem: Activity: Goal: Risk for activity intolerance will decrease Outcome: Adequate for Discharge   Problem: Clinical Measurements: Goal: Respiratory complications will improve Outcome: Adequate for Discharge   Problem: Safety: Goal: Ability to remain free from injury will improve Outcome: Adequate for Discharge

## 2020-10-09 NOTE — TOC Transition Note (Signed)
Transition of Care [facility]) - CM/SW Discharge Note   Patient Details  Name: Jannice Beitzel MRN: [id number] Date of Birth: Feb 17, 1940  [facility] of [facility]) CM/SW Contact:  Ross Ludwig, LCSW Phone Number: 10/09/2020, 10:47 AM   Clinical Narrative:     Patient will be going home with home health through [facility].  CSW signing off please reconsult with any other social work needs, home health agency has been notified of planned discharge.  Patient needs oxygen, CSW contacted Jermaine at [facility], and he will arrange to have it delivered before patient leaves.    Final next level of care: [place] Barriers to Discharge: Barriers Resolved   Patient Goals and CMS Choice Patient states their goals for this hospitalization and ongoing recovery are:: To return back home with home health. CMS Medicare.gov Compare Post Acute Care list provided to:: Patient Represenative (must comment) Choice offered to / list presented to : Adult Children  Discharge Placement  Home with home health.                     Discharge Plan and [place] with home health.              DME Arranged: Oxygen DME Agency: [employer] Date DME Agency Contacted: 10/09/20 Time DME Agency Contacted: 1 Representative spoke with at DME Agency: [facility] HH Arranged: RN, PT [place] Agency: [place] Date [place]: 10/09/20 Time [place]: 1047 Representative spoke with at [place]: [place] ([place]) Interventions     Readmission Risk Interventions No flowsheet data found.

## 2020-10-09 NOTE — Discharge Summary (Signed)
Physician Discharge Summary  Mandy [name] JTT:[id number] DOB: 03/08/1940 DOA: 10/01/2020  PCP: [name], MD  Admit date: 10/01/2020 Discharge date: 10/09/2020  Admitted From: home Disposition:  home  Recommendations for Outpatient Follow-up:  Follow up with PCP in 1-2 weeks Please obtain BMP/CBC in one week Please follow up dose of Lasix and adjust as needed Follow up on Mandy [name]'s new oxygen requirement.  She continues to need 2 L/min O2 with ambulation, but sats remain in upper 90's at rest  Home Health: PT, RN  Equipment/Devices: Oxygen   Discharge Condition: STable  CODE STATUS: Full  Diet recommendation: Heart Healthy / Carb modified    Discharge Diagnoses: Principal Problem:   Pleural effusion on right Active Problems:   Diabetes mellitus type 2, controlled (HCC)   Dyslipidemia   Essential hypertension   GERD   Acute respiratory failure with hypoxia (HCC)   Generalized weakness   Obesity (BMI 30-39.9)   Hyperthyroidism   Chronic diastolic CHF (congestive heart failure) (HCC)   Hyperkalemia   AKI (acute kidney injury) ([place])   Microcytic anemia    Summary of HPI and Hospital Course:  Mandy [name] is a 80 yo female with PMH DMII, HLD, HTN, GERD, hyperthyroidism, arthritis.  She was recently admitted 9/15 - 9/21 (underwent excision mediastinal mass) and readmitted 9/22 - 9/24 with SOB/hypoxia requiring lasix for diuresis of small B/L pleural effusions.   She has not been thriving well at home with ongoing significant weakness and shortness of breath.  Physical therapy came to Mandy [name] house yesterday and Mandy [name] was unable to partake with therapy due to ongoing deconditioning and hypoxia with minimal exertion.  She therefore was brought to the ER by Mandy [name] daughter today for further evaluation. She initially required nonrebreather which was transitioned to 4 L nasal cannula.  She was found to have a moderate sized pleural effusion on the right side.  She is admitted for  thoracentesis and further work-up.       Acute respiratory failure with hypoxia secondary to moderate right pleural effusion and acute on chronic diastolic CHF -Mandy [name] presented with evidence of fluid overload with vascular congestion on chest x-ray and pleural effusion.  Requiring supplemental oxygen without requirement for this at baseline.   Unsuccessful attempt at thoracentesis due to small pocket of fluid. Echo showed preserved EF 65 to 00% grade 1 diastolic dysfunction. Responded well to IV Lasix 40 mg BID.   Weight trending down. 10/6: Mandy [name] still withO2 desaturations.  Renal function stable and improving.  Chest x-ray repeated showed stable pleural effusion. ---Discharge day:--- 12 lb weight loss with diuresis during admission. Discharging on Lasix 40 mg PO BID. Advised close PCP follow up and check BMP for renal function and electrolytes. Daily weights at home. --Monitor renal function electrolytes in follow up. --Mandy [name] qualified for home oxygen due to desaturations with ambulation.  Home O2 was ordered and requested [facility] RN to follow up with Mandy [name].   Hyponatremia -present with sodium 115 on admission.  Suspected due to hypervolemia.   Sodium improved with diuresis.   Monitor BMP in follow up.   Nausea vomiting -Resolved.  Reported on Sunday afternoon with recurrent episode on Monday morning couple hours after breakfast (emesis noted to be undigested food).  Abdominal film unremarkable.  Daughter reported this being an issue at home for quite a while. --Small frequent meals --Consider gastric emptying study as outpatient --Supportive care with antiemetics as needed   AKI - borderline.  Renal function returned to baseline. --Monitor BMP --  Avoid dehydration and hypotension   Hyperkalemia - resolved. Monitor BMP.   Hyperthyroidism -TSH, T4 and T3 unremarkable. --Continue methimazole   Type 2 Diabetes - sliding scale Novolog   Essential HTN - continue amlodipine,  atenolol. Hold hydralazine.   Hyperlipidemia - continue statin  Discharge Instructions    Discharge Instructions     (HEART FAILURE PATIENTS) Call MD:  Anytime you have any of the following symptoms: 1) 3 pound weight gain in 24 hours or 5 pounds in 1 week 2) shortness of breath, with or without a dry hacking cough 3) swelling in the hands, feet or stomach 4) if you have to sleep on extra pillows at night in order to breathe.   Complete by: As directed    Call MD for:  extreme fatigue   Complete by: As directed    Call MD for:  persistant dizziness or light-headedness   Complete by: As directed    Call MD for:  persistant nausea and vomiting   Complete by: As directed    Call MD for:  severe uncontrolled pain   Complete by: As directed    Call MD for:  temperature >100.4   Complete by: As directed    Diet - low sodium heart healthy   Complete by: As directed    Discharge instructions   Complete by: As directed    Diuretic --I am sending you home with a prescription for Lasix (AKA furosemide).  This is a diuretic or water pill to help your body get rid of excess fluid.  You should weigh yourself every morning and write down your weight.  If you are gaining weight, that is a sign that you are retaining more fluid.  You should call your doctor for instructions.  Please see your primary care doctor to have labs checked within 1 week to check your kidneys and electrolytes.  Oxygen --you qualify for home oxygen because your oxygen level drops below 90% when you are up and walking around.  Please continue using oxygen at 2 L/min when walking around.  If your oxygen level is staying above 90%, you do not need to use the oxygen.  When we checked here, your oxygen stayed in the upper 90s when you are sitting still, so you most likely do not need it when you are resting.   Increase activity slowly   Complete by: As directed    No wound care   Complete by: As directed       Allergies as of  10/09/2020   No Known Allergies      Medication List     STOP taking these medications    benazepril-hydrochlorthiazide 20-25 MG tablet Commonly known as: LOTENSIN HCT       TAKE these medications    albuterol 108 (90 Base) MCG/ACT inhaler Commonly known as: VENTOLIN HFA Inhale 2 puffs into the lungs every 6 (six) hours as needed for wheezing or shortness of breath.   alendronate 70 MG tablet Commonly known as: FOSAMAX Take 70 mg by mouth every Tuesday. Take with a full glass of water on an empty stomach.   amLODipine 10 MG tablet Commonly known as: NORVASC Take 10 mg by mouth daily.   aspirin EC 81 MG tablet Take 81 mg by mouth daily. Swallow whole.   atenolol 50 MG tablet Commonly known as: TENORMIN Take 50 mg by mouth 2 (two) times daily.   calcium carbonate 1500 (600 Ca) MG Tabs tablet Commonly known as: OSCAL Take  600 mg of elemental calcium by mouth 2 (two) times daily with a meal.   diclofenac sodium 1 % Gel Commonly known as: VOLTAREN Apply 4 g topically 4 (four) times daily as needed for pain. shoulder   furosemide 40 MG tablet Commonly known as: LASIX Take 1 tablet (40 mg total) by mouth 2 (two) times daily.   gabapentin 100 MG capsule Commonly known as: NEURONTIN Take 100 mg by mouth at bedtime.   guaiFENesin 600 MG 12 hr tablet Commonly known as: MUCINEX Take 600 mg by mouth 2 (two) times daily as needed for cough.   hydrALAZINE 100 MG tablet Commonly known as: APRESOLINE Take 0.5 tablets (50 mg total) by mouth 3 (three) times daily. What changed: how much to take   MELATONIN ER PO Take 2 tablets by mouth at bedtime as needed (sleep).   metFORMIN 500 MG tablet Commonly known as: GLUCOPHAGE Take 500 mg by mouth 2 (two) times daily with a meal.   methimazole 5 MG tablet Commonly known as: TAPAZOLE Take 0.5 tablets (2.5 mg total) by mouth daily.   multivitamin with minerals Tabs tablet Take 1 tablet by mouth daily.   pantoprazole 40  MG tablet Commonly known as: PROTONIX Take 1 tablet (40 mg total) by mouth daily at 6 (six) AM. What changed: when to take this   simvastatin 20 MG tablet Commonly known as: ZOCOR Take 1 tablet (20 mg total) by mouth at bedtime.   traMADol 50 MG tablet Commonly known as: ULTRAM Take 1 tablet (50 mg total) by mouth every 6 (six) hours as needed (mild pain). What changed: reasons to take this   vitamin B-12 1000 MCG tablet Commonly known as: CYANOCOBALAMIN Take 1,000 mcg by mouth daily.   vitamin C 500 MG tablet Commonly known as: ASCORBIC ACID Take 500 mg by mouth daily.   Vitamin D (Cholecalciferol) 25 MCG (1000 UT) Tabs Take 1,000 Units by mouth daily.   zinc gluconate 50 MG tablet Take 50 mg by mouth daily.               Durable Medical Equipment  (From admission, onward)           Start     Ordered   10/09/20 0750  For home use only DME oxygen  Once       Question Answer Comment  Length of Need 6 Months   Mode or (Route) Nasal cannula   Liters per Minute 2   Frequency Continuous (stationary and portable oxygen unit needed)   Oxygen delivery system Gas      10/09/20 0749            No Known Allergies   If you experience worsening of your admission symptoms, develop shortness of breath, life threatening emergency, suicidal or homicidal thoughts you must seek medical attention immediately by calling 911 or calling your MD immediately  if symptoms less severe.    Please note   You were cared for by a hospitalist during your hospital stay. If you have any questions about your discharge medications or the care you received while you were in the hospital after you are discharged, you can call the unit and asked to speak with the hospitalist on call if the hospitalist that took care of you is not available. Once you are discharged, your primary care physician will handle any further medical issues. Please note that NO REFILLS for any discharge  medications will be authorized once you are discharged, as it  is imperative that you return to your primary care physician (or establish a relationship with a primary care physician if you do not have one) for your aftercare needs so that they can reassess your need for medications and monitor your lab values.   Consultations: none    Procedures/Studies: DG Chest 2 View  Result Date: 09/16/2020 CLINICAL DATA:  80 year old female with preoperative chest x-ray EXAM: CHEST - 2 VIEW COMPARISON:  06/26/2020, chest CT 06/26/2020 FINDINGS: Cardiomediastinal silhouette unchanged in size and contour. No pneumothorax.  No pleural effusion. No confluent airspace disease. Coarsened interstitial markings similar to the prior. Overall improved aeration compared to the prior plain film. IMPRESSION: Negative for acute cardiopulmonary disease, with improved aeration compared to the prior plain film. Electronically Signed   By: Corrie Mckusick D.O.   On: 09/16/2020 13:00   CT HEAD WO CONTRAST (5MM)  Result Date: 09/24/2020 CLINICAL DATA:  Dizziness. EXAM: CT HEAD WITHOUT CONTRAST TECHNIQUE: Contiguous axial images were obtained from the base of the skull through the vertex without intravenous contrast. COMPARISON:  CT head 06/26/2020. FINDINGS: Brain: There is stable mild periventricular white matter hypodensity, likely chronic small vessel ischemic change. Vascular: Atherosclerotic calcifications are present within the cavernous internal carotid arteries. Skull: Normal. Negative for fracture or focal lesion. Sinuses/Orbits: No acute finding. Other: None. IMPRESSION: No acute intracranial abnormality. Stable mild chronic small vessel ischemic changes. Electronically Signed   By: Ronney Asters M.D.   On: 09/24/2020 17:28   CT CHEST WO CONTRAST  Result Date: 10/02/2020 CLINICAL DATA:  Pneumonia, effusion or abscess suspected. Shortness of breath. EXAM: CT CHEST WITHOUT CONTRAST TECHNIQUE: Multidetector CT imaging of the  chest was performed following the standard protocol without IV contrast. COMPARISON:  Chest x-ray 10/01/2020. CT angiogram chest 09/24/2020. CT chest 08/09/2018. FINDINGS: Cardiovascular: Aorta is normal in size. The heart is enlarged. There is no pericardial effusion. There are atherosclerotic calcifications of the aorta and coronary arteries. Mediastinum/Nodes: No enlarged mediastinal or axillary lymph nodes. Thyroid gland, trachea, and esophagus demonstrate no significant findings. Lungs/Pleura: There is a moderate-sized right pleural effusion which is increased compared to the prior study. There is compressive atelectasis of the right lower lobe. A few air bronchograms are present in the right lower lobe and consolidation is not excluded. There is a stable nodular density in the right middle lobe measuring 7 mm, likely benign given stability. There is some dependent atelectasis in the left lower lobe and some stable scarring in the bilateral upper lobes. There is no evidence for pneumothorax. Trachea and central airways are patent. Upper Abdomen: No acute abnormality. Musculoskeletal: Right lateral chest wall edema is unchanged. No focal fluid collections are seen. Multilevel degenerative changes affect the spine. IMPRESSION: 1. Moderate right pleural effusion has increased. 2. Right lower lobe atelectasis/consolidation has increased. 3. Nonspecific right lateral chest wall edema is unchanged. Correlate clinically for history of trauma or infection. 4. Stable cardiomegaly. 5.  Aortic Atherosclerosis (ICD10-I70.0). Electronically Signed   By: Ronney Asters M.D.   On: 10/02/2020 19:35   CT Angio Chest PE W and/or Wo Contrast  Result Date: 09/24/2020 CLINICAL DATA:  Pulmonary embolus suspected with high probability. Dizziness and nausea and vomiting. EXAM: CT ANGIOGRAPHY CHEST WITH CONTRAST TECHNIQUE: Multidetector CT imaging of the chest was performed using the standard protocol during bolus administration of  intravenous contrast. Multiplanar CT image reconstructions and MIPs were obtained to evaluate the vascular anatomy. CONTRAST:  65mL OMNIPAQUE IOHEXOL 350 MG/ML SOLN COMPARISON:  06/26/2020 FINDINGS: Cardiovascular: Moderately  good opacification of the central and segmental pulmonary arteries. No focal filling defects demonstrated. No evidence of significant pulmonary embolus. Cardiac enlargement. No pericardial effusions. Normal caliber thoracic aorta. Great vessel origins are patent. Coronary artery and aortic calcification. Mediastinum/Nodes: Esophagus is decompressed. No significant lymphadenopathy. Thyroid gland is unremarkable. Lungs/Pleura: Bilateral pleural effusions with basilar atelectasis or consolidation, greater on the right. No pneumothorax. Airways are patent. Motion artifact limits examination. Upper Abdomen: Visualized upper abdomen demonstrates no acute abnormality. Musculoskeletal: Stranding or edema in the right lateral chest wall, possibly indicating contusion or infection. No associated rib fractures are identified. Degenerative changes in the spine and shoulders. Sternum appears intact. Review of the MIP images confirms the above findings. IMPRESSION: 1. No evidence of significant pulmonary embolus. 2. Bilateral pleural effusions with basilar atelectasis, greater on the right. 3. Cardiac enlargement.  Aortic atherosclerosis. 4. Stranding or edema in the right lateral chest wall may indicate contusion or infection. Correlate with physical examination. Electronically Signed   By: Lucienne Capers M.D.   On: 09/24/2020 23:08   [place] CHEST (PLEURAL EFFUSION)  Result Date: 10/02/2020 CLINICAL DATA:  Pleural effusion. EXAM: CHEST ULTRASOUND COMPARISON:  Chest x-ray from yesterday. FINDINGS: Small right pleural effusion.  No significant left pleural effusion. IMPRESSION: 1. Small right pleural effusion, too small to safely perform thoracentesis. 2. No significant left pleural effusion. Electronically  Signed   By: Titus Dubin M.D.   On: 10/02/2020 17:43   DG CHEST PORT 1 VIEW  Result Date: 10/08/2020 CLINICAL DATA:  Acute respiratory failure with hypoxia. EXAM: PORTABLE CHEST 1 VIEW COMPARISON:  10/06/2020 FINDINGS: The cardiac silhouette remains enlarged with mild central pulmonary vascular congestion. Right lower lung opacity is unchanged and consistent with a combination of pleural fluid and consolidation or atelectasis. Minimal atelectasis or scarring is noted in the left lung. No pneumothorax is identified. Degenerative changes are noted at the shoulders. IMPRESSION: Unchanged right pleural effusion and atelectasis or consolidation. Electronically Signed   By: Logan Bores M.D.   On: 10/08/2020 15:10   DG CHEST PORT 1 VIEW  Result Date: 10/06/2020 CLINICAL DATA:  Dyspnea. EXAM: PORTABLE CHEST 1 VIEW COMPARISON:  October 01, 2020. FINDINGS: Stable cardiomegaly. Stable mild central pulmonary vascular congestion. No pneumothorax. Stable right basilar atelectasis or infiltrate is noted with associated pleural effusion. Bony thorax is unremarkable. IMPRESSION: Stable cardiomegaly with mild central pulmonary vascular congestion. Stable right basilar opacity as described above. Electronically Signed   By: Marijo Conception M.D.   On: 10/06/2020 08:17   DG Chest Portable 1 View  Result Date: 10/01/2020 CLINICAL DATA:  Shortness of breath. EXAM: PORTABLE CHEST 1 VIEW COMPARISON:  CT angiogram chest 09/24/2020.  Chest x-ray 09/24/2020. FINDINGS: The cardiac silhouette appears enlarged and unchanged. There is central pulmonary vascular congestion. There are small bilateral pleural effusions, right greater than left. Pleural effusion on the right has increased. There is no pneumothorax. There are degenerative changes of the shoulders. No fractures are identified. IMPRESSION: 1. Cardiomegaly with central pulmonary vascular congestion and pleural effusions. Right-sided pleural effusion has increased.  Electronically Signed   By: Ronney Asters M.D.   On: 10/01/2020 16:21   DG Chest Portable 1 View  Result Date: 09/24/2020 CLINICAL DATA:  Cough. EXAM: PORTABLE CHEST 1 VIEW COMPARISON:  Chest x-ray 09/21/2020 FINDINGS: The heart is enlarged. There is central pulmonary vascular congestion. There are small bilateral pleural effusions with some bibasilar opacities. There is atelectasis in the left mid lung. IMPRESSION: Cardiomegaly with central pulmonary vascular  congestion and small pleural effusions. Bibasilar infiltrates may represent edema or atelectasis. Electronically Signed   By: Ronney Asters M.D.   On: 09/24/2020 23:13   DG CHEST PORT 1 VIEW  Result Date: 09/21/2020 CLINICAL DATA:  Status post mediastinal mass removal. EXAM: PORTABLE CHEST 1 VIEW COMPARISON:  September 20, 2020. FINDINGS: Stable cardiomegaly. No pneumothorax is noted. Stable right basilar opacity is noted concerning for pneumonia or atelectasis with associated effusion. Bony thorax is unremarkable. Minimal left basilar subsegmental atelectasis is noted. IMPRESSION: Stable right basilar pneumonia or atelectasis with associated pleural effusion. Aortic Atherosclerosis (ICD10-I70.0). Electronically Signed   By: Marijo Conception M.D.   On: 09/21/2020 08:16   DG CHEST PORT 1 VIEW  Result Date: 09/20/2020 CLINICAL DATA:  Postop check. EXAM: PORTABLE CHEST 1 VIEW COMPARISON:  September 19, 2020 FINDINGS: Increasing infiltrate in the right base. The left chest tube and right central line have been removed. Mild atelectasis in the left mid lower lung identified. There may be a small left effusion. No pneumothorax. Stable cardiomegaly. The hila and mediastinum are unremarkable. IMPRESSION: 1. Increasing infiltrate in the right base now favored to represent pneumonia or aspiration. Recommend clinical correlation and follow-up imaging to ensure resolution. 2. Interval removal of left chest tube without pneumothorax. 3. Probable atelectasis in  the left mid lower lung with a possible small associated effusion. These findings are similar in the interval. Electronically Signed   By: Dorise Bullion III M.D.   On: 09/20/2020 08:22   DG CHEST PORT 1 VIEW  Result Date: 09/19/2020 CLINICAL DATA:  Follow-up surgery. EXAM: PORTABLE CHEST 1 VIEW COMPARISON:  September 18, 2020 FINDINGS: The left chest tube is stable. No pneumothorax. The right central line terminates near the cavoatrial junction. Stable cardiomegaly. The hila and mediastinum are unchanged. Mild atelectasis in the right base. No other changes. IMPRESSION: 1. Support apparatus as above.  No pneumothorax. 2. Mild opacity in the right base favored to represent atelectasis. Recommend attention on follow-up. 3. No other interval changes. Electronically Signed   By: Dorise Bullion III M.D.   On: 09/19/2020 08:40   DG Chest Port 1 View  Result Date: 09/18/2020 CLINICAL DATA:  Chest tube EXAM: PORTABLE CHEST 1 VIEW COMPARISON:  09/17/2020 FINDINGS: Left chest tube and right central line remain in place, unchanged. No pneumothorax. Cardiomegaly with vascular congestion. Bilateral perihilar and lower lobe opacities, favor a combination of edema and atelectasis. No visible effusions or acute bony abnormality. IMPRESSION: Decreasing lung volumes with increasing perihilar and lower lobe opacities, likely a combination of edema and atelectasis. Left chest tube remains in place.  No pneumothorax. Electronically Signed   By: Rolm Baptise M.D.   On: 09/18/2020 08:52   DG Chest Port 1 View  Result Date: 09/17/2020 CLINICAL DATA:  Chest tube status post video assisted thoracic surgery. EXAM: PORTABLE CHEST 1 VIEW COMPARISON:  September 16, 2020.  August 11, 2020. FINDINGS: Stable cardiomegaly. Right-sided chest tube is now noted which appears to cross the midline, presumably into the anterior mediastinum. No pneumothorax is noted. Right internal jugular catheter is noted with tip in expected position of  cavoatrial junction. Minimal right basilar subsegmental atelectasis is noted. Left basilar atelectasis is noted with small left pleural effusion. Bony thorax is unremarkable. IMPRESSION: Right-sided chest tube is now noted which appears to cross midline, presumably into the anterior mediastinum. No pneumothorax is noted. Left basilar atelectasis is noted with small left pleural effusion. Electronically Signed   By: Sabino Dick  Jr M.D.   On: 09/17/2020 13:19   DG Abd Portable 1V  Result Date: 10/05/2020 CLINICAL DATA:  Nausea and vomiting EXAM: PORTABLE ABDOMEN - 1 VIEW COMPARISON:  06/29/2020 FINDINGS: The bowel gas pattern is normal. No gross free intraperitoneal air. No radio-opaque calculi or other significant radiographic abnormality are seen. Degenerative lumbar spondylosis. IMPRESSION: Nonobstructive bowel gas pattern. Electronically Signed   By: Davina Poke D.O.   On: 10/05/2020 14:43   ECHOCARDIOGRAM COMPLETE  Result Date: 10/02/2020    ECHOCARDIOGRAM REPORT   Mandy [name] Name:   AUDRIE KURI Date of Exam: 10/02/2020 Medical Rec #:  [id number]      Height:       59.0 in Accession #:    [id number]     Weight:       174.8 lb Date of Birth:  [birth year]       BSA:          1.742 m Mandy [name] Age:    19 years       BP:           106/78 mmHg Mandy [name] Gender: F              HR:           73 bpm. Exam Location:  Inpatient Procedure: 2D Echo, Color Doppler and Cardiac Doppler Indications:    I62.70 Acute diastolic (congestive) heart failure  History:        Mandy [name] has prior history of Echocardiogram examinations, most                 recent 02/03/2019. CHF; Risk Factors:Hypertension, Diabetes and                 Dyslipidemia. Recent removal of mediastinal mass.  Sonographer:    Raquel Sarna Senior RDCS Referring Phys: [place]  Sonographer Comments: Technically challenging study due to limited acoustic windows. Technically difficult due to large body habitus. IMPRESSIONS  1. Left ventricular ejection  fraction, by estimation, is 65 to 70%. The left ventricle has normal function. The left ventricle has no regional wall motion abnormalities. There is mild left ventricular hypertrophy. Left ventricular diastolic parameters are consistent with Grade I diastolic dysfunction (impaired relaxation).  2. Right ventricular systolic function is low normal. The right ventricular size is normal. There is moderately elevated pulmonary artery systolic pressure. The estimated right ventricular systolic pressure is 35.0 mmHg.  3. The mitral valve is grossly normal. No evidence of mitral valve regurgitation.  4. The aortic valve was not well visualized. Aortic valve regurgitation is not visualized.  5. The inferior vena cava is normal in size with greater than 50% respiratory variability, suggesting right atrial pressure of 3 mmHg. Comparison(s): Changes from prior study are noted. 02/03/2019: LVEF 50-55%. FINDINGS  Left Ventricle: Left ventricular ejection fraction, by estimation, is 65 to 70%. The left ventricle has normal function. The left ventricle has no regional wall motion abnormalities. The left ventricular internal cavity size was normal in size. There is  mild left ventricular hypertrophy. Left ventricular diastolic parameters are consistent with Grade I diastolic dysfunction (impaired relaxation). Indeterminate filling pressures. Right Ventricle: The right ventricular size is normal. No increase in right ventricular wall thickness. Right ventricular systolic function is low normal. There is moderately elevated pulmonary artery systolic pressure. The tricuspid regurgitant velocity  is 3.71 m/s, and with an assumed right atrial pressure of 3 mmHg, the estimated right ventricular systolic pressure is 09.3 mmHg. Left Atrium: Left atrial size was normal  in size. Right Atrium: Right atrial size was normal in size. Pericardium: There is no evidence of pericardial effusion. Mitral Valve: The mitral valve is grossly normal. No  evidence of mitral valve regurgitation. Tricuspid Valve: The tricuspid valve is grossly normal. Tricuspid valve regurgitation is mild. Aortic Valve: The aortic valve was not well visualized. Aortic valve regurgitation is not visualized. Pulmonic Valve: The pulmonic valve was normal in structure. Pulmonic valve regurgitation is not visualized. Aorta: The aortic root and ascending aorta are structurally normal, with no evidence of dilitation. Venous: The inferior vena cava is normal in size with greater than 50% respiratory variability, suggesting right atrial pressure of 3 mmHg. IAS/Shunts: The interatrial septum was not well visualized.  LEFT VENTRICLE PLAX 2D LVIDd:         3.80 cm  Diastology LVIDs:         2.30 cm  LV e' medial:    3.92 cm/s LV PW:         1.40 cm  LV E/e' medial:  13.4 LV IVS:        1.10 cm  LV e' lateral:   3.92 cm/s LVOT diam:     2.00 cm  LV E/e' lateral: 13.4 LV SV:         68 LV SV Index:   39 LVOT Area:     3.14 cm  RIGHT VENTRICLE RV S prime:     10.60 cm/s TAPSE (M-mode): 2.5 cm LEFT ATRIUM             Index       RIGHT ATRIUM           Index LA diam:        3.30 cm 1.89 cm/m  RA Area:     19.10 cm LA Vol (A2C):   34.2 ml 19.63 ml/m RA Volume:   53.70 ml  30.83 ml/m LA Vol (A4C):   35.3 ml 20.27 ml/m LA Biplane Vol: 34.9 ml 20.04 ml/m  AORTIC VALVE LVOT Vmax:   104.00 cm/s LVOT Vmean:  79.100 cm/s LVOT VTI:    0.215 m  AORTA Ao Root diam: 3.00 cm Ao Asc diam:  3.50 cm MITRAL VALVE                TRICUSPID VALVE MV Area (PHT): 3.17 cm     TR Peak grad:   55.1 mmHg MV Decel Time: 239 msec     TR Vmax:        371.00 cm/s MV E velocity: 52.50 cm/s MV A velocity: 118.00 cm/s  SHUNTS MV E/A ratio:  0.44         Systemic VTI:  0.22 m                             Systemic Diam: 2.00 cm Lyman Bishop MD Electronically signed by Lyman Bishop MD Signature Date/Time: 10/02/2020/3:53:40 PM    Final        Subjective: Pt in recliner eating breakfast. Daughter at bedside.  Pt says she  feels well.  No SOB at rest, dyspnea with exertion improving.  No other acute complaints.   Discharge Exam: Vitals:   10/08/20 2136 10/09/20 0420  BP: 140/63 138/64  Pulse: 67 70  Resp: 16 15  Temp: 98.1 F (36.7 C) 98 F (36.7 C)  SpO2:  99%   Vitals:   10/08/20 1319 10/08/20 2136 10/09/20 0420 10/09/20 0952  BP: 129/63 140/63  138/64   Pulse: 69 67 70   Resp:  16 15   Temp: 97.9 F (36.6 C) 98.1 F (36.7 C) 98 F (36.7 C)   TempSrc: Oral Oral Oral   SpO2: 100%  99%   Weight:    73.4 kg  Height:        General: Pt is alert, awake, not in acute distress Cardiovascular: RRR, S1/S2 +, no rubs, no gallops Respiratory: CTA bilaterally, diminished right base, no wheezing, no rhonchi Abdominal: Soft, NT, ND, bowel sounds + Extremities: no edema, no cyanosis    The results of significant diagnostics from this hospitalization (including imaging, microbiology, ancillary and laboratory) are listed below for reference.     Microbiology: Recent Results (from the past 240 hour(s))  Culture, blood (Routine X 2) w Reflex to ID Panel     Status: None   Collection Time: 10/01/20  3:16 PM   Specimen: BLOOD  Result Value Ref Range Status   Specimen Description   Final    BLOOD RIGHT ANTECUBITAL Performed at [facility] [address] [place], [place] [address]    Special Requests   Final    BOTTLES DRAWN AEROBIC AND ANAEROBIC Blood Culture adequate volume Performed at [facility] [address], [place], [place] [address]    Culture   Final    NO GROWTH 5 DAYS Performed at [facility], [place] [address], [place], [place] [address]    Report Status 10/06/2020 FINAL  Final  SARS CORONAVIRUS 2 (TAT 6-24 HRS) Nasopharyngeal Nasopharyngeal Swab     Status: None   Collection Time: 10/01/20  3:17 PM   Specimen: Nasopharyngeal Swab  Result Value Ref Range Status   SARS Coronavirus 2 NEGATIVE NEGATIVE Final    Comment:  (NOTE) SARS-CoV-2 target nucleic acids are NOT DETECTED.  The SARS-CoV-2 RNA is generally detectable in upper and lower respiratory specimens during the acute phase of infection. Negative results do not preclude SARS-CoV-2 infection, do not rule out co-infections with other pathogens, and should not be used as the sole basis for treatment or other Mandy [name] management decisions. Negative results must be combined with clinical observations, Mandy [name] history, and epidemiological information. The expected result is Negative.  Fact Sheet for Patients: SugarRoll.be  Fact Sheet for Healthcare Providers: https://www.woods-mathews.com/  This test is not yet approved or cleared by the [place] FDA and  has been authorized for detection and/or diagnosis of SARS-CoV-2 by FDA under an Emergency Use Authorization (EUA). This EUA will remain  in effect (meaning this test can be used) for the duration of the COVID-19 declaration under Se ction 564(b)(1) of the Act, 21 U.S.C. section 360bbb-3(b)(1), unless the authorization is terminated or revoked sooner.  Performed at [facility], [place] [address], [place], [place] [address]   Culture, blood (Routine X 2) w Reflex to ID Panel     Status: None   Collection Time: 10/01/20  7:14 PM   Specimen: BLOOD  Result Value Ref Range Status   Specimen Description   Final    BLOOD LEFT ANTECUBITAL Performed at [facility] [address] [place], [place] [address]    Special Requests   Final    BOTTLES DRAWN AEROBIC ONLY Blood Culture results may not be optimal due to an inadequate volume of blood received in culture bottles Performed at [facility] [address] [place], [place] [address]    Culture   Final  NO GROWTH 5 DAYS Performed at [facility], [place] [address] [place], [place] [address]    Report Status 10/06/2020 FINAL  Final     Labs: BNP  (last 3 results) Recent Labs    06/26/20 0401 09/25/20 0211 10/01/20 1514  BNP 68.9 93.6 235.3*   Basic Metabolic Panel: Recent Labs  Lab 10/04/20 0438 10/05/20 0356 10/06/20 0400 10/07/20 0401 10/08/20 0351 10/09/20 0416 10/09/20 0547  NA 133* 131* 133* 131* 129*  --  133*  K 3.7 3.5 3.2* 4.4 3.6  --  3.7  CL 88* 89* 90* 90* 90*  --  92*  CO2 32 33* 35* 33* 32  --  34*  GLUCOSE 98 133* 105* 95 153*  --  102*  BUN 11 13 11 12 13   --  17  CREATININE 0.96 1.10* 1.15* 1.18* 1.05*  --  1.11*  CALCIUM 8.8* 8.4* 8.9 9.2 8.9  --  9.3  MG 2.0 1.8 1.8  --  1.6* 2.2  --    Liver Function Tests: Recent Labs  Lab 10/03/20 0418 10/04/20 0438 10/05/20 0356 10/06/20 0400  AST 16 17 17  12*  ALT 7 8 8 8   ALKPHOS 38 40 37* 37*  BILITOT 0.4 0.5 0.5 0.5  PROT 6.4* 6.6 6.1* 6.2*  ALBUMIN 3.3* 3.4* 3.3* 3.3*   No results for input(s): LIPASE, AMYLASE in the last 168 hours. No results for input(s): AMMONIA in the last 168 hours. CBC: Recent Labs  Lab 10/03/20 0418 10/04/20 0438 10/05/20 0356 10/06/20 0400 10/07/20 0401  WBC 5.1 6.6 4.1 4.5 4.1  NEUTROABS 3.1 3.6 2.4 2.2  --   HGB 9.7* 10.0* 11.1* 9.4* 9.1*  HCT 28.8* 29.8* 33.8* 28.0* 27.5*  MCV 79.8* 79.5* 80.7 79.8* 80.9  PLT 418* 489* 359 398 395   Cardiac Enzymes: No results for input(s): CKTOTAL, CKMB, CKMBINDEX, TROPONINI in the last 168 hours. BNP: Invalid input(s): POCBNP CBG: Recent Labs  Lab 10/08/20 0713 10/08/20 1116 10/08/20 1635 10/08/20 2134 10/09/20 0728  GLUCAP 116* 152* 83 126* 114*   D-Dimer No results for input(s): DDIMER in the last 72 hours. Hgb A1c No results for input(s): HGBA1C in the last 72 hours. Lipid Profile No results for input(s): CHOL, HDL, LDLCALC, TRIG, CHOLHDL, LDLDIRECT in the last 72 hours. Thyroid function studies No results for input(s): TSH, T4TOTAL, T3FREE, THYROIDAB in the last 72 hours.  Invalid input(s): FREET3 Anemia work up No results for input(s): VITAMINB12,  FOLATE, FERRITIN, TIBC, IRON, RETICCTPCT in the last 72 hours. Urinalysis    Component Value Date/Time   COLORURINE YELLOW 09/24/2020 1536   APPEARANCEUR HAZY (A) 09/24/2020 1536   LABSPEC 1.016 09/24/2020 1536   PHURINE 5.0 09/24/2020 1536   GLUCOSEU NEGATIVE 09/24/2020 1536   HGBUR NEGATIVE 09/24/2020 1536   BILIRUBINUR NEGATIVE 09/24/2020 1536   KETONESUR NEGATIVE 09/24/2020 1536   PROTEINUR NEGATIVE 09/24/2020 1536   NITRITE NEGATIVE 09/24/2020 1536   LEUKOCYTESUR TRACE (A) 09/24/2020 1536   Sepsis Labs Invalid input(s): PROCALCITONIN,  WBC,  LACTICIDVEN Microbiology Recent Results (from the past 240 hour(s))  Culture, blood (Routine X 2) w Reflex to ID Panel     Status: None   Collection Time: 10/01/20  3:16 PM   Specimen: BLOOD  Result Value Ref Range Status   Specimen Description   Final    BLOOD RIGHT ANTECUBITAL Performed at [facility], [place] [address], [place], [place] [address]    Special Requests   Final  BOTTLES DRAWN AEROBIC AND ANAEROBIC Blood Culture adequate volume Performed at [facility] [address], [place], [place] [address]    Culture   Final    NO GROWTH 5 DAYS Performed at [facility], [place] [address] [place], [place] [address]    Report Status 10/06/2020 FINAL  Final  SARS CORONAVIRUS 2 (TAT 6-24 HRS) Nasopharyngeal Nasopharyngeal Swab     Status: None   Collection Time: 10/01/20  3:17 PM   Specimen: Nasopharyngeal Swab  Result Value Ref Range Status   SARS Coronavirus 2 NEGATIVE NEGATIVE Final    Comment: (NOTE) SARS-CoV-2 target nucleic acids are NOT DETECTED.  The SARS-CoV-2 RNA is generally detectable in upper and lower respiratory specimens during the acute phase of infection. Negative results do not preclude SARS-CoV-2 infection, do not rule out co-infections with other pathogens, and should not be used as the sole basis for treatment or other Mandy [name] management decisions. Negative  results must be combined with clinical observations, Mandy [name] history, and epidemiological information. The expected result is Negative.  Fact Sheet for Patients: SugarRoll.be  Fact Sheet for Healthcare Providers: https://www.woods-mathews.com/  This test is not yet approved or cleared by the [place] FDA and  has been authorized for detection and/or diagnosis of SARS-CoV-2 by FDA under an Emergency Use Authorization (EUA). This EUA will remain  in effect (meaning this test can be used) for the duration of the COVID-19 declaration under Se ction 564(b)(1) of the Act, 21 U.S.C. section 360bbb-3(b)(1), unless the authorization is terminated or revoked sooner.  Performed at [facility], [place] [address], [place], [place] [address]   Culture, blood (Routine X 2) w Reflex to ID Panel     Status: None   Collection Time: 10/01/20  7:14 PM   Specimen: BLOOD  Result Value Ref Range Status   Specimen Description   Final    BLOOD LEFT ANTECUBITAL Performed at [facility] [address] [place], [place] [address]    Special Requests   Final    BOTTLES DRAWN AEROBIC ONLY Blood Culture results may not be optimal due to an inadequate volume of blood received in culture bottles Performed at [facility] [address], [place], [place] [address]    Culture   Final    NO GROWTH 5 DAYS Performed at [facility], [place] [address] [place], [place] [address]    Report Status 10/06/2020 FINAL  Final     Time coordinating discharge: Over 30 minutes  SIGNED:   Ezekiel Slocumb, DO [facility] 10/09/2020, 10:14 AM   If 7PM-7AM, please contact night-coverage [email or web address]

## 2020-10-14 ENCOUNTER — Other Ambulatory Visit: Payer: Self-pay | Admitting: Internal Medicine

## 2020-10-14 LAB — BASIC METABOLIC PANEL WITH GFR
BUN/Creatinine Ratio: 12 (calc) (ref 6–22)
BUN: 18 mg/dL (ref 7–25)
CO2: 31 mmol/L (ref 20–32)
Calcium: 9.2 mg/dL (ref 8.6–10.4)
Chloride: 87 mmol/L — ABNORMAL LOW (ref 98–110)
Creat: 1.47 mg/dL — ABNORMAL HIGH (ref 0.60–1.00)
Glucose, Bld: 110 mg/dL — ABNORMAL HIGH (ref 65–99)
Potassium: 3.4 mmol/L — ABNORMAL LOW (ref 3.5–5.3)
Sodium: 130 mmol/L — ABNORMAL LOW (ref 135–146)
eGFR: 36 mL/min/{1.73_m2} — ABNORMAL LOW (ref >=60)

## 2020-10-14 LAB — CBC WITH DIFFERENTIAL/PLATELET
Absolute Monocytes: 582 cells/uL (ref 200–950)
Basophils Absolute: 22 cells/uL (ref 0–200)
Basophils Relative: 0.4 %
Eosinophils Absolute: 101 cells/uL (ref 15–500)
Eosinophils Relative: 1.8 %
HCT: 33.2 % — ABNORMAL LOW (ref 35.0–45.0)
Hemoglobin: 10.4 g/dL — ABNORMAL LOW (ref 11.7–15.5)
Lymphs Abs: 1294 cells/uL (ref 850–3900)
MCH: 26.1 pg — ABNORMAL LOW (ref 27.0–33.0)
MCHC: 31.3 g/dL — ABNORMAL LOW (ref 32.0–36.0)
MCV: 83.2 fL (ref 80.0–100.0)
MPV: 10.1 fL (ref 7.5–12.5)
Monocytes Relative: 10.4 %
Neutro Abs: 3601 cells/uL (ref 1500–7800)
Neutrophils Relative %: 64.3 %
Platelets: 372 10*3/uL (ref 140–400)
RBC: 3.99 10*6/uL (ref 3.80–5.10)
RDW: 13.8 % (ref 11.0–15.0)
Total Lymphocyte: 23.1 %
WBC: 5.6 10*3/uL (ref 3.8–10.8)

## 2020-10-14 LAB — CLIENT EDUCATION TRACKING

## 2020-10-16 ENCOUNTER — Other Ambulatory Visit: Payer: Self-pay

## 2020-10-16 ENCOUNTER — Encounter: Payer: Self-pay | Admitting: Thoracic Surgery (Cardiothoracic Vascular Surgery)

## 2020-10-16 ENCOUNTER — Ambulatory Visit (INDEPENDENT_AMBULATORY_CARE_PROVIDER_SITE_OTHER): Payer: Self-pay | Admitting: Thoracic Surgery (Cardiothoracic Vascular Surgery)

## 2020-10-16 VITALS — BP 155/80 | HR 71 | Resp 20 | Ht 59.0 in | Wt 165.0 lb

## 2020-10-16 DIAGNOSIS — Z4889 Encounter for other specified surgical aftercare: Secondary | ICD-10-CM

## 2020-10-16 DIAGNOSIS — J9859 Other diseases of mediastinum, not elsewhere classified: Secondary | ICD-10-CM

## 2020-10-16 NOTE — Progress Notes (Signed)
      [address]       [place] [address]             [phone]        Mandy [name] [facility] Medical Record #[id number] Date of Birth: Oct 14, 1940  Referring: Mandy Limbo, MD Primary Care: Mandy Ebbs, MD Primary Cardiologist:None  Reason for visit:   follow-up  History of Present Illness:     Mandy [name] comes in for her first follow-up appointment after undergoing a mediastinal mass resection.  She has been admitted to the hospital on 2 occasions due to shortness of breath and oxygen requirements.  She was diuresed fairly aggressively but remains on oxygen.  She is not moving much at home and is using a walker now.  Physical Exam: BP (!) 155/80 (BP Location: Right Arm, Patient Position: Sitting)   Pulse 71   Resp 20   Ht 4\' 11"  (1.499 m)   Wt 165 lb (74.8 kg)   SpO2 99% Comment: 2L O2   BMI 33.33 kg/m   Alert NAD Incision clean.   Abdomen soft, ND Trace peripheral edema   Diagnostic Studies & Laboratory data: CXR: Right effusion Path: Benign thyroid tissue.    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  She continues to recover from this operation.  She is quite debilitated but this was present several point preoperatively.  I will see her back in 1 month with a chest x-ray.  If this fluid persists we will plan for thoracentesis.   Mandy [name] 10/16/2020 11:52 AM

## 2020-11-03 ENCOUNTER — Emergency Department (HOSPITAL_COMMUNITY): Payer: Medicare Other

## 2020-11-03 ENCOUNTER — Encounter (HOSPITAL_COMMUNITY): Payer: Self-pay | Admitting: Emergency Medicine

## 2020-11-03 ENCOUNTER — Inpatient Hospital Stay (HOSPITAL_COMMUNITY)
Admission: EM | Admit: 2020-11-03 | Discharge: 2020-11-06 | DRG: 291 | Disposition: A | Discharge status: Home / Self Care | Payer: Medicare Other | Attending: Internal Medicine | Admitting: Internal Medicine

## 2020-11-03 ENCOUNTER — Telehealth: Payer: Self-pay | Admitting: *Deleted

## 2020-11-03 DIAGNOSIS — Z7982 Long term (current) use of aspirin: Secondary | ICD-10-CM | POA: Diagnosis not present

## 2020-11-03 DIAGNOSIS — Z603 Acculturation difficulty: Secondary | ICD-10-CM | POA: Diagnosis present

## 2020-11-03 DIAGNOSIS — I5031 Acute diastolic (congestive) heart failure: Secondary | ICD-10-CM | POA: Diagnosis present

## 2020-11-03 DIAGNOSIS — Z9842 Cataract extraction status, left eye: Secondary | ICD-10-CM

## 2020-11-03 DIAGNOSIS — Z9841 Cataract extraction status, right eye: Secondary | ICD-10-CM

## 2020-11-03 DIAGNOSIS — Z8616 Personal history of COVID-19: Secondary | ICD-10-CM | POA: Diagnosis not present

## 2020-11-03 DIAGNOSIS — I130 Hypertensive heart and chronic kidney disease with heart failure and stage 1 through stage 4 chronic kidney disease, or unspecified chronic kidney disease: Principal | ICD-10-CM | POA: Diagnosis present

## 2020-11-03 DIAGNOSIS — Z79899 Other long term (current) drug therapy: Secondary | ICD-10-CM

## 2020-11-03 DIAGNOSIS — R0602 Shortness of breath: Secondary | ICD-10-CM | POA: Diagnosis present

## 2020-11-03 DIAGNOSIS — F419 Anxiety disorder, unspecified: Secondary | ICD-10-CM | POA: Diagnosis not present

## 2020-11-03 DIAGNOSIS — Z20822 Contact with and (suspected) exposure to covid-19: Secondary | ICD-10-CM | POA: Diagnosis present

## 2020-11-03 DIAGNOSIS — D631 Anemia in chronic kidney disease: Secondary | ICD-10-CM | POA: Diagnosis present

## 2020-11-03 DIAGNOSIS — E669 Obesity, unspecified: Secondary | ICD-10-CM | POA: Diagnosis not present

## 2020-11-03 DIAGNOSIS — Z6832 Body mass index (BMI) 32.0-32.9, adult: Secondary | ICD-10-CM | POA: Diagnosis not present

## 2020-11-03 DIAGNOSIS — N1831 Chronic kidney disease, stage 3a: Secondary | ICD-10-CM | POA: Diagnosis present

## 2020-11-03 DIAGNOSIS — J9621 Acute and chronic respiratory failure with hypoxia: Secondary | ICD-10-CM | POA: Diagnosis present

## 2020-11-03 DIAGNOSIS — Z961 Presence of intraocular lens: Secondary | ICD-10-CM | POA: Diagnosis present

## 2020-11-03 DIAGNOSIS — J90 Pleural effusion, not elsewhere classified: Secondary | ICD-10-CM

## 2020-11-03 DIAGNOSIS — K219 Gastro-esophageal reflux disease without esophagitis: Secondary | ICD-10-CM | POA: Diagnosis not present

## 2020-11-03 DIAGNOSIS — J918 Pleural effusion in other conditions classified elsewhere: Secondary | ICD-10-CM | POA: Diagnosis present

## 2020-11-03 DIAGNOSIS — I5033 Acute on chronic diastolic (congestive) heart failure: Secondary | ICD-10-CM

## 2020-11-03 DIAGNOSIS — I509 Heart failure, unspecified: Secondary | ICD-10-CM

## 2020-11-03 DIAGNOSIS — E785 Hyperlipidemia, unspecified: Secondary | ICD-10-CM | POA: Diagnosis present

## 2020-11-03 DIAGNOSIS — E1122 Type 2 diabetes mellitus with diabetic chronic kidney disease: Secondary | ICD-10-CM | POA: Diagnosis present

## 2020-11-03 DIAGNOSIS — Z7984 Long term (current) use of oral hypoglycemic drugs: Secondary | ICD-10-CM

## 2020-11-03 DIAGNOSIS — E871 Hypo-osmolality and hyponatremia: Secondary | ICD-10-CM | POA: Diagnosis present

## 2020-11-03 DIAGNOSIS — I700 Atherosclerosis of aorta: Secondary | ICD-10-CM | POA: Diagnosis present

## 2020-11-03 DIAGNOSIS — R0902 Hypoxemia: Secondary | ICD-10-CM

## 2020-11-03 DIAGNOSIS — Z9889 Other specified postprocedural states: Secondary | ICD-10-CM

## 2020-11-03 LAB — COMPREHENSIVE METABOLIC PANEL
ALT: 14 U/L (ref 0–44)
AST: 33 U/L (ref 15–41)
Albumin: 3.8 g/dL (ref 3.5–5.0)
Alkaline Phosphatase: 35 U/L — ABNORMAL LOW (ref 38–126)
Anion gap: 10 (ref 5–15)
BUN: 15 mg/dL (ref 8–23)
CO2: 32 mmol/L (ref 22–32)
Calcium: 9 mg/dL (ref 8.9–10.3)
Chloride: 87 mmol/L — ABNORMAL LOW (ref 98–111)
Creatinine, Ser: 1.1 mg/dL — ABNORMAL HIGH (ref 0.44–1.00)
GFR, Estimated: 51 mL/min — ABNORMAL LOW (ref >60)
Glucose, Bld: 129 mg/dL — ABNORMAL HIGH (ref 70–99)
Potassium: 4.7 mmol/L (ref 3.5–5.1)
Sodium: 129 mmol/L — ABNORMAL LOW (ref 135–145)
Total Bilirubin: 1 mg/dL (ref 0.3–1.2)
Total Protein: 7.2 g/dL (ref 6.5–8.1)

## 2020-11-03 LAB — CBC WITH DIFFERENTIAL/PLATELET
Abs Immature Granulocytes: 0.01 10*3/uL (ref 0.00–0.07)
Basophils Absolute: 0 10*3/uL (ref 0.0–0.1)
Basophils Relative: 1 %
Eosinophils Absolute: 0.1 10*3/uL (ref 0.0–0.5)
Eosinophils Relative: 2 %
HCT: 32.6 % — ABNORMAL LOW (ref 36.0–46.0)
Hemoglobin: 10.5 g/dL — ABNORMAL LOW (ref 12.0–15.0)
Immature Granulocytes: 0 %
Lymphocytes Relative: 20 %
Lymphs Abs: 1.2 10*3/uL (ref 0.7–4.0)
MCH: 26.3 pg (ref 26.0–34.0)
MCHC: 32.2 g/dL (ref 30.0–36.0)
MCV: 81.5 fL (ref 80.0–100.0)
Monocytes Absolute: 0.5 10*3/uL (ref 0.1–1.0)
Monocytes Relative: 9 %
Neutro Abs: 4.2 10*3/uL (ref 1.7–7.7)
Neutrophils Relative %: 68 %
Platelets: 423 10*3/uL — ABNORMAL HIGH (ref 150–400)
RBC: 4 MIL/uL (ref 3.87–5.11)
RDW: 15.1 % (ref 11.5–15.5)
WBC: 6.1 10*3/uL (ref 4.0–10.5)
nRBC: 0 % (ref 0.0–0.2)

## 2020-11-03 LAB — RESP PANEL BY RT-PCR (FLU A&B, COVID) ARPGX2
Influenza A by PCR: NEGATIVE
Influenza B by PCR: NEGATIVE
SARS Coronavirus 2 by RT PCR: NEGATIVE

## 2020-11-03 LAB — BASIC METABOLIC PANEL
Anion gap: 12 (ref 5–15)
BUN: 15 mg/dL (ref 8–23)
CO2: 33 mmol/L — ABNORMAL HIGH (ref 22–32)
Calcium: 9.1 mg/dL (ref 8.9–10.3)
Chloride: 88 mmol/L — ABNORMAL LOW (ref 98–111)
Creatinine, Ser: 0.92 mg/dL (ref 0.44–1.00)
GFR, Estimated: >60 mL/min (ref >60)
Glucose, Bld: 155 mg/dL — ABNORMAL HIGH (ref 70–99)
Potassium: 5 mmol/L (ref 3.5–5.1)
Sodium: 133 mmol/L — ABNORMAL LOW (ref 135–145)

## 2020-11-03 LAB — CBG MONITORING, ED
Glucose-Capillary: 151 mg/dL — ABNORMAL HIGH (ref 70–99)
Glucose-Capillary: 140 mg/dL — ABNORMAL HIGH (ref 70–99)

## 2020-11-03 LAB — TROPONIN I (HIGH SENSITIVITY)
Troponin I (High Sensitivity): 35 ng/L — ABNORMAL HIGH (ref <18)
Troponin I (High Sensitivity): 37 ng/L — ABNORMAL HIGH (ref <18)

## 2020-11-03 LAB — BRAIN NATRIURETIC PEPTIDE: B Natriuretic Peptide: 163.4 pg/mL — ABNORMAL HIGH (ref 0.0–100.0)

## 2020-11-03 MED ORDER — FUROSEMIDE 10 MG/ML IJ SOLN
40.0000 mg | Freq: Two times a day (BID) | INTRAMUSCULAR | Status: AC
  Administered 2020-11-03 – 2020-11-05 (×5): 40 mg via INTRAVENOUS
  Filled 2020-11-03 (×5): qty 4

## 2020-11-03 MED ORDER — SIMVASTATIN 20 MG PO TABS
20.0000 mg | ORAL_TABLET | Freq: Every day | ORAL | Status: DC
  Administered 2020-11-03 – 2020-11-05 (×3): 20 mg via ORAL
  Filled 2020-11-03 (×3): qty 1

## 2020-11-03 MED ORDER — INSULIN ASPART 100 UNIT/ML IJ SOLN
0.0000 [IU] | Freq: Three times a day (TID) | INTRAMUSCULAR | Status: DC
  Administered 2020-11-03: 3 [IU] via SUBCUTANEOUS
  Administered 2020-11-04: 2 [IU] via SUBCUTANEOUS
  Administered 2020-11-05: 3 [IU] via SUBCUTANEOUS
  Administered 2020-11-05: 2 [IU] via SUBCUTANEOUS
  Filled 2020-11-03: qty 0.15

## 2020-11-03 MED ORDER — AMLODIPINE BESYLATE 10 MG PO TABS
10.0000 mg | ORAL_TABLET | Freq: Every day | ORAL | Status: DC
  Administered 2020-11-03 – 2020-11-04 (×2): 10 mg via ORAL
  Filled 2020-11-03: qty 1
  Filled 2020-11-03: qty 2

## 2020-11-03 MED ORDER — FUROSEMIDE 10 MG/ML IJ SOLN
40.0000 mg | Freq: Once | INTRAMUSCULAR | Status: AC
  Administered 2020-11-03: 40 mg via INTRAVENOUS
  Filled 2020-11-03: qty 4

## 2020-11-03 MED ORDER — MORPHINE SULFATE (PF) 2 MG/ML IV SOLN
2.0000 mg | INTRAVENOUS | Status: DC | PRN
  Administered 2020-11-03: 2 mg via INTRAVENOUS
  Filled 2020-11-03: qty 1

## 2020-11-03 MED ORDER — METHYLPREDNISOLONE SODIUM SUCC 125 MG IJ SOLR
125.0000 mg | Freq: Once | INTRAMUSCULAR | Status: AC
  Administered 2020-11-03: 125 mg via INTRAVENOUS
  Filled 2020-11-03: qty 2

## 2020-11-03 MED ORDER — INSULIN ASPART 100 UNIT/ML IJ SOLN
0.0000 [IU] | Freq: Every day | INTRAMUSCULAR | Status: DC
  Filled 2020-11-03: qty 0.05

## 2020-11-03 MED ORDER — ATENOLOL 50 MG PO TABS
50.0000 mg | ORAL_TABLET | Freq: Two times a day (BID) | ORAL | Status: DC
  Filled 2020-11-03 (×2): qty 1

## 2020-11-03 MED ORDER — HYDRALAZINE HCL 20 MG/ML IJ SOLN: 10.0000 mg | Freq: Three times a day (TID) | INTRAMUSCULAR | Status: DC | PRN

## 2020-11-03 MED ORDER — ATENOLOL 50 MG PO TABS
50.0000 mg | ORAL_TABLET | Freq: Two times a day (BID) | ORAL | Status: DC
  Administered 2020-11-04 – 2020-11-06 (×6): 50 mg via ORAL
  Filled 2020-11-03 (×7): qty 1

## 2020-11-03 NOTE — H&P (Signed)
History and Physical    Mandy [name] TJQ:[id number] DOB: [birth year] DOA: 11/03/2020  PCP: [name], MD  Patient coming from: Home  Chief Complaint: dyspnea  HPI: Mandy [name] is a 80 y.o. female with medical history significant of diastolic dysfxn, HTN, HLD, DM2, hyperthyroidism. Presenting with dyspnea. History is from daughter as patient does not speak [place] and there is no interpreter available for her dialect from [place]. Her daughter reports that the patient has had several days of increasing hypoxia. She is normally on 2L [place] at home. Her daughter has noticed that her O2 stas have been staying in the 80's. She has slowly increased her O2 but her mother's O2 sats drop as soon as she moves or if the oxygen goes back to her usual levels. The patient has not complained of chest. There have been no syncopal episodes. They have noted some increased swelling. The daughter became concerned and called for EMS. They deny any other aggravating or alleviating factors.   ED Course: CXR showed increasing pleural effusion and HF. EDP spoke with CTS -- Dr. Kipp [name] -- about her effusion. He performed a resection of a mediastinal mass and has followed her in clinic. He recommended thoracentesis. She was given lasix. TRH was called for admission.   Review of Systems:  Review of systems is otherwise negative for all not mentioned in HPI.   PMHx Past Medical History:  Diagnosis Date   Aortic atherosclerosis ([facility]) 10/08/2018   Arthritis    Diabetes mellitus without complication (H[facility])    GERD (gastroesophageal reflux disease)    Hepatic steatosis 10/08/2018   Hyperlipidemia    Hypertension    Hyperthyroidism    Mass of right ovary 10/08/2018   Osteoporosis    Pneumonia    SBO (small bowel obstruction) ([facility]) 08/09/2018   SBO (small bowel obstruction) ([facility]) 08/09/2018    PSHx Past Surgical History:  Procedure Laterality Date   CATARACT EXTRACTION W/ INTRAOCULAR LENS  IMPLANT,  BILATERAL     COLONOSCOPY      SocHx  reports that she has never smoked. She has never used smokeless tobacco. She reports that she does not drink alcohol and does not use drugs.  No Known Allergies  FamHx Family History  Problem Relation Age of Onset   Kidney disease Son    Colon cancer Neg Hx    Breast cancer Neg Hx     Prior to Admission medications   Medication Sig Start Date End Date Taking? Authorizing Provider  albuterol (VENTOLIN HFA) 108 (90 Base) MCG/ACT inhaler Inhale 2 puffs into the lungs every 6 (six) hours as needed for wheezing or shortness of breath. 06/30/20   Thurnell Lose, MD  alendronate (FOSAMAX) 70 MG tablet Take 70 mg by mouth every Tuesday. Take with a full glass of water on an empty stomach.    [provider]  amLODipine (NORVASC) 10 MG tablet Take 10 mg by mouth daily.    [provider]  aspirin EC 81 MG tablet Take 81 mg by mouth daily. Swallow whole.    [provider]  atenolol (TENORMIN) 50 MG tablet Take 50 mg by mouth 2 (two) times daily.    [provider]  calcium carbonate (OSCAL) 1500 (600 Ca) MG TABS tablet Take 600 mg of elemental calcium by mouth 2 (two) times daily with a meal.    [provider]  diazepam (VALIUM) 2 MG tablet Take 2 mg by mouth every 12 (twelve) hours as needed for  anxiety.    [provider]  diclofenac sodium (VOLTAREN) 1 % GEL Apply 4 g topically 4 (four) times daily as needed for pain. shoulder 09/13/18   [provider]  furosemide (LASIX) 40 MG tablet Take 1 tablet (40 mg total) by mouth 2 (two) times daily. 10/09/20   Nicole Kindred A, DO  gabapentin (NEURONTIN) 100 MG capsule Take 100 mg by mouth at bedtime. 07/18/18   [provider]  guaiFENesin (MUCINEX) 600 MG 12 hr tablet Take 600 mg by mouth 2 (two) times daily as needed for cough.    [provider]  hydrALAZINE (APRESOLINE) 100 MG tablet Take 0.5 tablets (50 mg total) by mouth 3  (three) times daily. 10/09/20   Ezekiel Slocumb, DO  MELATONIN ER PO Take 2 tablets by mouth at bedtime as needed (sleep).    [provider]  metFORMIN (GLUCOPHAGE) 500 MG tablet Take 500 mg by mouth 2 (two) times daily with a meal.     [provider]  methimazole (TAPAZOLE) 5 MG tablet Take 0.5 tablets (2.5 mg total) by mouth daily. 02/04/19   Hosie Poisson, MD  Multiple Vitamin (MULTIVITAMIN WITH MINERALS) TABS tablet Take 1 tablet by mouth daily.    [provider]  pantoprazole (PROTONIX) 40 MG tablet Take 1 tablet (40 mg total) by mouth daily at 6 (six) AM. Patient taking differently: Take 40 mg by mouth daily. 06/30/20   Thurnell Lose, MD  simvastatin (ZOCOR) 20 MG tablet Take 1 tablet (20 mg total) by mouth at bedtime. 06/30/20   Thurnell Lose, MD  traMADol (ULTRAM) 50 MG tablet Take 1 tablet (50 mg total) by mouth every 6 (six) hours as needed (mild pain). Patient taking differently: Take 50 mg by mouth every 6 (six) hours as needed for moderate pain (mild pain). 09/22/20   Gold, Wilder Glade, PA-C  vitamin B-12 (CYANOCOBALAMIN) 1000 MCG tablet Take 1,000 mcg by mouth daily.    [provider]  vitamin C (ASCORBIC ACID) 500 MG tablet Take 500 mg by mouth daily.    [provider]  Vitamin D, Cholecalciferol, 25 MCG (1000 UT) TABS Take 1,000 Units by mouth daily.    [provider]  zinc gluconate 50 MG tablet Take 50 mg by mouth daily.    [provider]    Physical Exam: Vitals:   11/03/20 1135 11/03/20 1245 11/03/20 1433  BP: (!) 146/96 (!) 163/69 (!) 165/79  Pulse: 76 76 69  Resp: 20 (!) 25 (!) 22  Temp: 98.2 F (36.8 C)    TempSrc: Oral    SpO2: 98% 97% 98%    General: 80 y.o. female resting in bed in NAD Eyes: PERRL, normal sclera ENMT: Nares patent w/o discharge, orophaynx clear, dentition normal, ears w/o discharge/lesions/ulcers Neck: Supple, trachea midline Cardiovascular: RRR, +S1, S2, no m/g/r, equal  pulses throughout Respiratory: decreased at bases, soft wheeze noted b/l, slightly increased WOB on 4L [place] GI: BS+, NDNT, no masses noted, no organomegaly noted MSK: No c/c; BLE edema Neuro: A&O x 3, no focal deficits Psyc: Appropriate interaction and affect, calm/cooperative  Labs on Admission: I have personally reviewed following labs and imaging studies  CBC: Recent Labs  Lab 11/03/20 1241  WBC 6.1  NEUTROABS 4.2  HGB 10.5*  HCT 32.6*  MCV 81.5  PLT 212*   Basic Metabolic Panel: Recent Labs  Lab 11/03/20 1241  NA 129*  K 4.7  CL 87*  CO2 32  GLUCOSE 129*  BUN 15  CREATININE 1.10*  CALCIUM 9.0   GFR: CrCl cannot be calculated (Unknown ideal weight.). Liver Function Tests: Recent Labs  Lab 11/03/20 1241  AST 33  ALT 14  ALKPHOS 35*  BILITOT 1.0  PROT 7.2  ALBUMIN 3.8   No results for input(s): LIPASE, AMYLASE in the last 168 hours. No results for input(s): AMMONIA in the last 168 hours. Coagulation Profile: No results for input(s): INR, PROTIME in the last 168 hours. Cardiac Enzymes: No results for input(s): CKTOTAL, CKMB, CKMBINDEX, TROPONINI in the last 168 hours. BNP (last 3 results) No results for input(s): PROBNP in the last 8760 hours. HbA1C: No results for input(s): HGBA1C in the last 72 hours. CBG: No results for input(s): GLUCAP in the last 168 hours. Lipid Profile: No results for input(s): CHOL, HDL, LDLCALC, TRIG, CHOLHDL, LDLDIRECT in the last 72 hours. Thyroid Function Tests: No results for input(s): TSH, T4TOTAL, FREET4, T3FREE, THYROIDAB in the last 72 hours. Anemia Panel: No results for input(s): VITAMINB12, FOLATE, FERRITIN, TIBC, IRON, RETICCTPCT in the last 72 hours. Urine analysis:    Component Value Date/Time   COLORURINE YELLOW 09/24/2020 1536   APPEARANCEUR HAZY (A) 09/24/2020 1536   LABSPEC 1.016 09/24/2020 1536   PHURINE 5.0 09/24/2020 1536   GLUCOSEU NEGATIVE 09/24/2020 1536   HGBUR NEGATIVE 09/24/2020 1536    BILIRUBINUR NEGATIVE 09/24/2020 1536   KETONESUR NEGATIVE 09/24/2020 1536   PROTEINUR NEGATIVE 09/24/2020 1536   NITRITE NEGATIVE 09/24/2020 1536   LEUKOCYTESUR TRACE (A) 09/24/2020 1536    Radiological Exams on Admission: DG Chest 2 View  Result Date: 11/03/2020 CLINICAL DATA:  Shortness of breath. EXAM: CHEST - 2 VIEW COMPARISON:  Chest x-ray dated October 08, 2020. FINDINGS: Unchanged moderate cardiomegaly. Worsening pulmonary vascular congestion. Increasing moderate right and unchanged trace left pleural effusions. Increased fluid within the minor fissure. Continued collapse of the right lower lobe. Unchanged scarring in the lingula. No pneumothorax. No acute osseous abnormality. IMPRESSION: 1. Worsening congestive heart failure with increased pulmonary vascular congestion and moderate right pleural effusion. 2. Continued collapse of the right lower lobe. Electronically Signed   By: Titus Dubin M.D.   On: 11/03/2020 12:29    EKG: Independently reviewed. Sinus, no st elevations  Assessment/Plan Dyspnea Right pleural effusion Acute on chronic diastolic HF     - place in obs, tele     - I&O, daily wts, fluid restriction     - thoracentesis ordered     - lasix  DM2     - DM diet, SSI, glucose checks, A1c  Hyponatremia     - getting lasix; fluid restriction     - follow renal function panel  HTN     - resume home regimen when confirmed  HLD     - resume home statin when confirmed  Anxiety     - resume home regimen when confirmed  CKD 3a     - at baseline, follow  DVT prophylaxis: SCDs  Code Status: FULL  Family Communication: w/ dtr by phone  Consults called: IR   Status is: Observation  The patient remains OBS appropriate and will d/c before 2 midnights.  Jonnie Finner DO [facility]  If 7PM-7AM, please contact night-coverage [email or web address]  11/03/2020, 4:25 PM

## 2020-11-03 NOTE — ED Triage Notes (Signed)
Pt presents via [facility] after having increasing SOB at home. Normally on 2L and dropped into 80% range with PT. Pt has hx of fluid build up. Diminished in lower lungs per EMS. Alert and oriented. Walks with a walker.

## 2020-11-03 NOTE — ED Provider Notes (Signed)
[facility] DEPT Provider Note   CSN: [id number] Arrival date & time: 11/03/20  1124     History Chief Complaint  Patient presents with   Shortness of Breath    Mandy [name] is a 80 y.o. female.  HPI  80 year old female past medical history of HTN, HLD, GERD, DM on chronic supplemental oxygen presents emergency room with worsening shortness of breath and concern for hypoxia.  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  History is limited secondary to language barrier.  Patient speaks minimal English.  Daughter told EMS that she speaks [place], from [place].  She did not participate with 2 separate [place] interpreters.  Report from EMS is that the patient was found to be 80% on her chronic 2 L requirement but is otherwise been baseline.  No reported fever.  Patient has been having decline in ambulation since recent hospital admissions.  Past Medical History:  Diagnosis Date   Aortic atherosclerosis ([place]) 10/08/2018   Arthritis    Diabetes mellitus without complication ([place])    GERD (gastroesophageal reflux disease)    Hepatic steatosis 10/08/2018   Hyperlipidemia    Hypertension    Hyperthyroidism    Mass of right ovary 10/08/2018   Osteoporosis    Pneumonia    SBO (small bowel obstruction) ([place]) 08/09/2018   SBO (small bowel obstruction) ([place]) 08/09/2018    Patient Active Problem List   Diagnosis Date Noted   Pleural effusion on right 10/01/2020   Hyperkalemia 10/01/2020   AKI (acute kidney injury) ([place]) 10/01/2020   Microcytic anemia 10/01/2020   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   Acute respiratory failure with hypoxia ([place]) 06/26/2020   Hypertensive urgency 06/26/2020   Hypokalemia 06/26/2020   Generalized weakness 06/26/2020   Dizziness 06/26/2020   Obesity (BMI 30-39.9)  06/26/2020   Hyperthyroidism 06/26/2020   Chronic diastolic CHF (congestive heart failure) ([place]) 06/26/2020   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   Prolonged QT interval 01/31/2019   Hyponatremia 82/50/5397   Acute metabolic encephalopathy 67/34/1937   COVID-19 virus infection 01/31/2019   Mass of right ovary 10/08/2018   Hepatic steatosis 10/08/2018   Aortic atherosclerosis ([place]) 10/08/2018   SBO (small bowel obstruction) ([place]) 08/09/2018   Diabetes mellitus type 2, controlled ([place]) 04/07/2008   Dyslipidemia 04/07/2008   Essential hypertension 04/07/2008   SORE THROAT 04/07/2008   GERD 04/07/2008    Past Surgical History:  Procedure Laterality Date   CATARACT EXTRACTION W/ INTRAOCULAR LENS  IMPLANT, BILATERAL     COLONOSCOPY       OB History   No obstetric history on file.     Family History  Problem Relation Age of Onset   Kidney disease Son    Colon cancer Neg Hx    Breast cancer Neg Hx     Social History   Tobacco Use   Smoking status: Never   Smokeless tobacco: Never  Vaping Use   Vaping Use: Never used  Substance Use Topics   Alcohol use: No   Drug use: No    Home Medications Prior to Admission medications   Medication Sig Start Date End Date Taking? Authorizing Provider  albuterol (VENTOLIN HFA) 108 (90 Base) MCG/ACT inhaler Inhale 2 puffs into the lungs every 6 (six) hours as needed for wheezing or shortness of breath. 06/30/20   Thurnell Lose, MD  alendronate (  FOSAMAX) 70 MG tablet Take 70 mg by mouth every Tuesday. Take with a full glass of water on an empty stomach.    [provider]  amLODipine (NORVASC) 10 MG tablet Take 10 mg by mouth daily.    [provider]  aspirin EC 81 MG tablet Take 81 mg by mouth daily. Swallow whole.    [provider]  atenolol (TENORMIN) 50 MG tablet Take 50 mg by mouth 2 (two) times daily.    [provider]  calcium carbonate (OSCAL) 1500  (600 Ca) MG TABS tablet Take 600 mg of elemental calcium by mouth 2 (two) times daily with a meal.    [provider]  diazepam (VALIUM) 2 MG tablet Take 2 mg by mouth every 12 (twelve) hours as needed for anxiety.    [provider]  diclofenac sodium (VOLTAREN) 1 % GEL Apply 4 g topically 4 (four) times daily as needed for pain. shoulder 09/13/18   [provider]  furosemide (LASIX) 40 MG tablet Take 1 tablet (40 mg total) by mouth 2 (two) times daily. 10/09/20   Nicole Kindred A, DO  gabapentin (NEURONTIN) 100 MG capsule Take 100 mg by mouth at bedtime. 07/18/18   [provider]  guaiFENesin (MUCINEX) 600 MG 12 hr tablet Take 600 mg by mouth 2 (two) times daily as needed for cough.    [provider]  hydrALAZINE (APRESOLINE) 100 MG tablet Take 0.5 tablets (50 mg total) by mouth 3 (three) times daily. 10/09/20   Ezekiel Slocumb, DO  MELATONIN ER PO Take 2 tablets by mouth at bedtime as needed (sleep).    [provider]  metFORMIN (GLUCOPHAGE) 500 MG tablet Take 500 mg by mouth 2 (two) times daily with a meal.     [provider]  methimazole (TAPAZOLE) 5 MG tablet Take 0.5 tablets (2.5 mg total) by mouth daily. 02/04/19   Hosie Poisson, MD  Multiple Vitamin (MULTIVITAMIN WITH MINERALS) TABS tablet Take 1 tablet by mouth daily.    [provider]  pantoprazole (PROTONIX) 40 MG tablet Take 1 tablet (40 mg total) by mouth daily at 6 (six) AM. Patient taking differently: Take 40 mg by mouth daily. 06/30/20   Thurnell Lose, MD  simvastatin (ZOCOR) 20 MG tablet Take 1 tablet (20 mg total) by mouth at bedtime. 06/30/20   Thurnell Lose, MD  traMADol (ULTRAM) 50 MG tablet Take 1 tablet (50 mg total) by mouth every 6 (six) hours as needed (mild pain). Patient taking differently: Take 50 mg by mouth every 6 (six) hours as needed for moderate pain (mild pain). 09/22/20   Gold, Wilder Glade, PA-C  vitamin B-12 (CYANOCOBALAMIN) 1000 MCG  tablet Take 1,000 mcg by mouth daily.    [provider]  vitamin C (ASCORBIC ACID) 500 MG tablet Take 500 mg by mouth daily.    [provider]  Vitamin D, Cholecalciferol, 25 MCG (1000 UT) TABS Take 1,000 Units by mouth daily.    [provider]  zinc gluconate 50 MG tablet Take 50 mg by mouth daily.    [provider]    Allergies    Patient has no known allergies.  Review of Systems   Review of Systems  Reason unable to perform ROS: language barrier.   Physical Exam Updated Vital Signs BP (!) 163/69   Pulse 76   Temp 98.2 F (36.8 C) (Oral)   Resp (!) 25   SpO2 97%   Physical  Exam Vitals and nursing note reviewed.  Constitutional:      Appearance: Normal appearance. She is ill-appearing.  HENT:     Head: Normocephalic.     Mouth/Throat:     Mouth: Mucous membranes are moist.  Cardiovascular:     Rate and Rhythm: Normal rate.  Pulmonary:     Effort: Tachypnea and respiratory distress present.     Breath sounds: No stridor.     Comments: Diminished breath sounds RLL, wheezes Abdominal:     Palpations: Abdomen is soft.     Tenderness: There is no abdominal tenderness.  Musculoskeletal:     Right lower leg: Edema present.     Left lower leg: Edema present.  Skin:    General: Skin is warm.  Neurological:     Mental Status: She is alert and oriented to person, place, and time. Mental status is at baseline.  Psychiatric:        Mood and Affect: Mood normal.    ED Results / Procedures / Treatments   Labs (all labs ordered are listed, but only abnormal results are displayed) Labs Reviewed  CBC WITH DIFFERENTIAL/PLATELET - Abnormal; Notable for the following components:      Result Value   Hemoglobin 10.5 (*)    HCT 32.6 (*)    Platelets 423 (*)    All other components within normal limits  RESP PANEL BY RT-PCR (FLU A&B, COVID) ARPGX2  COMPREHENSIVE METABOLIC PANEL  BRAIN NATRIURETIC PEPTIDE  TROPONIN I (HIGH SENSITIVITY)   TROPONIN I (HIGH SENSITIVITY)    EKG None  Radiology DG Chest 2 View  Result Date: 11/03/2020 CLINICAL DATA:  Shortness of breath. EXAM: CHEST - 2 VIEW COMPARISON:  Chest x-ray dated October 08, 2020. FINDINGS: Unchanged moderate cardiomegaly. Worsening pulmonary vascular congestion. Increasing moderate right and unchanged trace left pleural effusions. Increased fluid within the minor fissure. Continued collapse of the right lower lobe. Unchanged scarring in the lingula. No pneumothorax. No acute osseous abnormality. IMPRESSION: 1. Worsening congestive heart failure with increased pulmonary vascular congestion and moderate right pleural effusion. 2. Continued collapse of the right lower lobe. Electronically Signed   By: Titus Dubin M.D.   On: 11/03/2020 12:29    Procedures .Critical Care Performed by: Lorelle Gibbs, DO Authorized by: Lorelle Gibbs, DO   Critical care provider statement:    Critical care time (minutes):  40   Critical care time was exclusive of:  Separately billable procedures and treating other patients   Critical care was necessary to treat or prevent imminent or life-threatening deterioration of the following conditions:  Respiratory failure   Critical care was time spent personally by me on the following activities:  Ordering and performing treatments and interventions, ordering and review of laboratory studies, ordering and review of radiographic studies, re-evaluation of patient's condition, development of treatment plan with patient or surrogate, discussions with consultants, evaluation of patient's response to treatment, examination of patient and pulse oximetry   Care discussed with: admitting provider     Medications Ordered in ED Medications  furosemide (LASIX) injection 40 mg (40 mg Intravenous Given 11/03/20 1418)  methylPREDNISolone sodium succinate (SOLU-MEDROL) 125 mg/2 mL injection 125 mg (125 mg Intravenous Given 11/03/20 1417)    ED Course   I have reviewed the triage vital signs and the nursing notes.  Pertinent labs & imaging results that were available during my care of the patient were reviewed by me and considered in my medical decision making (see chart for details).  MDM Rules/Calculators/A&P                           80 year old female presents emergency department with worsening shortness of breath and reported hypoxia on her baseline 2 L requirement.  At rest she is periodically requiring 4L, tachypneic, slight belly breathing but not hypoxic.  They have been having to increase her supplemental oxygen.  Of note history is limited secondary to language barrier.  Patient did not participate with to [place] interpreters.  The interpreter secretary believes patient speaks [place], no available interpreter.  Chest x-ray shows worsening findings of heart failure, pulmonary congestion and right-sided pleural effusion with lung collapse.  No leukocytosis on blood work.  Flu and COVID are pending.  Spoke with cardiothoracic surgeon Dr. Kipp Brood who did the patient's mediastinal mass resection and follows her in the office.  He states if her symptoms do not improve with diuresis and treatment she may require thoracentesis.  She is okay to stay here at the [facility].  Plan for diuresis and treatment, medical admission.  Dr. Marylyn Ishihara is excepting.  Patients evaluation and results requires admission for further treatment and care. Patient agrees with admission plan, offers no new complaints and is stable/unchanged at time of admit.  Final Clinical Impression(s) / ED Diagnoses Final diagnoses:  SOB (shortness of breath)  Pleural effusion    Rx / DC Orders ED Discharge Orders     None        Lorelle Gibbs, DO 11/03/20 1432

## 2020-11-03 NOTE — Telephone Encounter (Signed)
Patient's home health RN, Langley Gauss, contacted the office stating patient's right lung has diminished breath sounds and is requiring a higher supplemental oxygen demand. Per nurse patient is currently on 4L [place] to achieve a saturation for 92-93%. Patient ambulated in an attempt to raise saturation levels but could only maintain a saturation of 82% on the 4L. Per Langley Gauss patient is having labored breathing. Upon return call home health RN had contacted EMS for patient pickup. Advised nurse that is the best place for patient to receive care at this time. Nurse agrees, states EMS is taking patient to [facility] ED.

## 2020-11-03 NOTE — ED Notes (Signed)
RN walked in to patient's room. Patient had removed her [place], BP cuff and heart monitor leads. O2 sat was reading 40. RN placed [place] @3L  back in patient's nares.RN at bedside watching O2 Sat increase

## 2020-11-04 ENCOUNTER — Inpatient Hospital Stay (HOSPITAL_COMMUNITY): Payer: Medicare Other

## 2020-11-04 ENCOUNTER — Observation Stay (HOSPITAL_COMMUNITY): Payer: Medicare Other

## 2020-11-04 ENCOUNTER — Other Ambulatory Visit: Payer: Self-pay

## 2020-11-04 DIAGNOSIS — Z7984 Long term (current) use of oral hypoglycemic drugs: Secondary | ICD-10-CM | POA: Diagnosis not present

## 2020-11-04 DIAGNOSIS — Z9842 Cataract extraction status, left eye: Secondary | ICD-10-CM | POA: Diagnosis not present

## 2020-11-04 DIAGNOSIS — J9621 Acute and chronic respiratory failure with hypoxia: Secondary | ICD-10-CM | POA: Diagnosis present

## 2020-11-04 DIAGNOSIS — J90 Pleural effusion, not elsewhere classified: Secondary | ICD-10-CM

## 2020-11-04 DIAGNOSIS — J918 Pleural effusion in other conditions classified elsewhere: Secondary | ICD-10-CM | POA: Diagnosis present

## 2020-11-04 DIAGNOSIS — Z9841 Cataract extraction status, right eye: Secondary | ICD-10-CM | POA: Diagnosis not present

## 2020-11-04 DIAGNOSIS — Z961 Presence of intraocular lens: Secondary | ICD-10-CM | POA: Diagnosis present

## 2020-11-04 DIAGNOSIS — I5031 Acute diastolic (congestive) heart failure: Secondary | ICD-10-CM | POA: Diagnosis present

## 2020-11-04 DIAGNOSIS — Z9889 Other specified postprocedural states: Secondary | ICD-10-CM | POA: Diagnosis not present

## 2020-11-04 DIAGNOSIS — I509 Heart failure, unspecified: Secondary | ICD-10-CM

## 2020-11-04 DIAGNOSIS — Z7982 Long term (current) use of aspirin: Secondary | ICD-10-CM | POA: Diagnosis not present

## 2020-11-04 DIAGNOSIS — I130 Hypertensive heart and chronic kidney disease with heart failure and stage 1 through stage 4 chronic kidney disease, or unspecified chronic kidney disease: Secondary | ICD-10-CM | POA: Diagnosis present

## 2020-11-04 DIAGNOSIS — F419 Anxiety disorder, unspecified: Secondary | ICD-10-CM | POA: Diagnosis present

## 2020-11-04 DIAGNOSIS — Z603 Acculturation difficulty: Secondary | ICD-10-CM | POA: Diagnosis present

## 2020-11-04 DIAGNOSIS — Z79899 Other long term (current) drug therapy: Secondary | ICD-10-CM | POA: Diagnosis not present

## 2020-11-04 DIAGNOSIS — R0602 Shortness of breath: Secondary | ICD-10-CM

## 2020-11-04 DIAGNOSIS — E785 Hyperlipidemia, unspecified: Secondary | ICD-10-CM | POA: Diagnosis present

## 2020-11-04 DIAGNOSIS — E669 Obesity, unspecified: Secondary | ICD-10-CM | POA: Diagnosis present

## 2020-11-04 DIAGNOSIS — E871 Hypo-osmolality and hyponatremia: Secondary | ICD-10-CM | POA: Diagnosis present

## 2020-11-04 DIAGNOSIS — Z8616 Personal history of COVID-19: Secondary | ICD-10-CM | POA: Diagnosis not present

## 2020-11-04 DIAGNOSIS — Z6832 Body mass index (BMI) 32.0-32.9, adult: Secondary | ICD-10-CM | POA: Diagnosis not present

## 2020-11-04 DIAGNOSIS — D631 Anemia in chronic kidney disease: Secondary | ICD-10-CM | POA: Diagnosis present

## 2020-11-04 DIAGNOSIS — I700 Atherosclerosis of aorta: Secondary | ICD-10-CM | POA: Diagnosis present

## 2020-11-04 DIAGNOSIS — K219 Gastro-esophageal reflux disease without esophagitis: Secondary | ICD-10-CM | POA: Diagnosis present

## 2020-11-04 DIAGNOSIS — Z20822 Contact with and (suspected) exposure to covid-19: Secondary | ICD-10-CM | POA: Diagnosis present

## 2020-11-04 DIAGNOSIS — I5033 Acute on chronic diastolic (congestive) heart failure: Secondary | ICD-10-CM | POA: Diagnosis not present

## 2020-11-04 DIAGNOSIS — E1122 Type 2 diabetes mellitus with diabetic chronic kidney disease: Secondary | ICD-10-CM | POA: Diagnosis present

## 2020-11-04 DIAGNOSIS — N1831 Chronic kidney disease, stage 3a: Secondary | ICD-10-CM | POA: Diagnosis present

## 2020-11-04 LAB — BODY FLUID CELL COUNT WITH DIFFERENTIAL
Eos, Fluid: 0 %
Lymphs, Fluid: 36 %
Monocyte-Macrophage-Serous Fluid: 52 % (ref 50–90)
Neutrophil Count, Fluid: 12 % (ref 0–25)
Total Nucleated Cell Count, Fluid: 29 cu mm (ref 0–1000)

## 2020-11-04 LAB — BASIC METABOLIC PANEL
Anion gap: 7 (ref 5–15)
BUN: 20 mg/dL (ref 8–23)
CO2: 38 mmol/L — ABNORMAL HIGH (ref 22–32)
Calcium: 9.3 mg/dL (ref 8.9–10.3)
Chloride: 90 mmol/L — ABNORMAL LOW (ref 98–111)
Creatinine, Ser: 1.24 mg/dL — ABNORMAL HIGH (ref 0.44–1.00)
GFR, Estimated: 44 mL/min — ABNORMAL LOW (ref >60)
Glucose, Bld: 163 mg/dL — ABNORMAL HIGH (ref 70–99)
Potassium: 4.2 mmol/L (ref 3.5–5.1)
Sodium: 135 mmol/L (ref 135–145)

## 2020-11-04 LAB — ECHOCARDIOGRAM COMPLETE
Area-P 1/2: 3.27 cm2
Calc EF: 62.3 %
MV M vel: 5.31 m/s
MV Peak grad: 112.8 mmHg
Radius: 0.4 cm
S' Lateral: 3.05 cm
Single Plane A2C EF: 72.5 %
Single Plane A4C EF: 54.5 %

## 2020-11-04 LAB — CBC
HCT: 31.3 % — ABNORMAL LOW (ref 36.0–46.0)
Hemoglobin: 10.1 g/dL — ABNORMAL LOW (ref 12.0–15.0)
MCH: 26.3 pg (ref 26.0–34.0)
MCHC: 32.3 g/dL (ref 30.0–36.0)
MCV: 81.5 fL (ref 80.0–100.0)
Platelets: 447 10*3/uL — ABNORMAL HIGH (ref 150–400)
RBC: 3.84 MIL/uL — ABNORMAL LOW (ref 3.87–5.11)
RDW: 15 % (ref 11.5–15.5)
WBC: 4.6 10*3/uL (ref 4.0–10.5)
nRBC: 0 % (ref 0.0–0.2)

## 2020-11-04 LAB — HEMOGLOBIN A1C
Hgb A1c MFr Bld: 5.9 % — ABNORMAL HIGH (ref 4.8–5.6)
Mean Plasma Glucose: 122.63 mg/dL

## 2020-11-04 LAB — PROTEIN, PLEURAL OR PERITONEAL FLUID: Total protein, fluid: 3.8 g/dL

## 2020-11-04 LAB — GLUCOSE, CAPILLARY
Glucose-Capillary: 111 mg/dL — ABNORMAL HIGH (ref 70–99)
Glucose-Capillary: 140 mg/dL — ABNORMAL HIGH (ref 70–99)

## 2020-11-04 LAB — ALBUMIN, PLEURAL OR PERITONEAL FLUID: Albumin, Fluid: 2.4 g/dL

## 2020-11-04 LAB — CBG MONITORING, ED: Glucose-Capillary: 149 mg/dL — ABNORMAL HIGH (ref 70–99)

## 2020-11-04 MED ORDER — HYDRALAZINE HCL 50 MG PO TABS
50.0000 mg | ORAL_TABLET | Freq: Three times a day (TID) | ORAL | Status: DC
  Administered 2020-11-04 – 2020-11-06 (×6): 50 mg via ORAL
  Filled 2020-11-04 (×6): qty 1

## 2020-11-04 MED ORDER — GABAPENTIN 100 MG PO CAPS
100.0000 mg | ORAL_CAPSULE | Freq: Every day | ORAL | Status: DC
  Administered 2020-11-04 – 2020-11-05 (×2): 100 mg via ORAL
  Filled 2020-11-04 (×2): qty 1

## 2020-11-04 MED ORDER — VITAMIN B-12 1000 MCG PO TABS
1000.0000 ug | ORAL_TABLET | Freq: Every day | ORAL | Status: DC
  Administered 2020-11-05 – 2020-11-06 (×2): 1000 ug via ORAL
  Filled 2020-11-04 (×2): qty 1

## 2020-11-04 MED ORDER — LIDOCAINE HCL 1 % IJ SOLN
INTRAMUSCULAR | Status: AC
  Administered 2020-11-04: 9 mL
  Filled 2020-11-04: qty 20

## 2020-11-04 MED ORDER — ADULT MULTIVITAMIN W/MINERALS CH
1.0000 | ORAL_TABLET | Freq: Every day | ORAL | Status: DC
  Administered 2020-11-05 – 2020-11-06 (×2): 1 via ORAL
  Filled 2020-11-04 (×2): qty 1

## 2020-11-04 MED ORDER — VITAMIN D 25 MCG (1000 UNIT) PO TABS
1000.0000 [IU] | ORAL_TABLET | Freq: Every day | ORAL | Status: DC
  Administered 2020-11-05 – 2020-11-06 (×2): 1000 [IU] via ORAL
  Filled 2020-11-04 (×2): qty 1

## 2020-11-04 MED ORDER — METHIMAZOLE 5 MG PO TABS
2.5000 mg | ORAL_TABLET | Freq: Every day | ORAL | Status: DC
  Administered 2020-11-05 – 2020-11-06 (×2): 2.5 mg via ORAL
  Filled 2020-11-04 (×3): qty 1

## 2020-11-04 MED ORDER — ASCORBIC ACID 500 MG PO TABS
500.0000 mg | ORAL_TABLET | Freq: Every day | ORAL | Status: DC
  Administered 2020-11-05 – 2020-11-06 (×2): 500 mg via ORAL
  Filled 2020-11-04 (×2): qty 1

## 2020-11-04 MED ORDER — DIAZEPAM 2 MG PO TABS: 2.0000 mg | ORAL_TABLET | Freq: Two times a day (BID) | ORAL | Status: DC | PRN

## 2020-11-04 NOTE — Progress Notes (Signed)
  Echocardiogram 2D Echocardiogram has been performed.  Mandy [name] 11/04/2020, 8:43 AM

## 2020-11-04 NOTE — Procedures (Signed)
Ultrasound-guided diagnostic and therapeutic right thoracentesis performed yielding 620 cc of hazy,amber colored fluid. No immediate complications. Follow-up chest x-ray pending. The fluid was submitted to the lab for preordered studies.EBL< 2 cc.

## 2020-11-04 NOTE — ED Notes (Signed)
ECHO tech at bedside.

## 2020-11-04 NOTE — Progress Notes (Signed)
Mandy [name]  VWU:[id number] DOB: 19-Jan-1940 DOA: 11/03/2020 PCP: [name], MD    Brief Narrative:  30FA with a history of diastolic CHF, chronic hypoxic respiratory failure on 2 L nasal cannula, HTN, HLD, DM2, and hyperthyroidism who presented to the ED with worsening dyspnea over several days, as well as worsening peripheral edema. O2 saturations were consistently in the mid 80s at home despite her usual 2 L oxygen dose. This would significantly worsen with the slightest movement. CXR in the ED noted an increasing pleural effusion and pulmonary edema.  Consultants:  IR  Code Status: FULL CODE  Antimicrobials:  None  DVT prophylaxis: SCDs  Subjective: Spoke to the patient via her daughter at bedside.  The patient has no new complaints.  She states her breathing has dramatically improved after her thoracentesis.  She is experiencing some expected pain at the thoracentesis site.  Denies substernal chest pressure or significant shortness of breath.  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  Acute diastolic CHF exacerbation -pulmonary edema TTE October 2022 noted EF of 65-70% and grade 1 diastolic dysfunction - d/c weight 73.4kg 10/09/20  Filed Weights   11/04/20 0903  Weight: 74.3 kg     CKD stage IIIa Creatinine increasing with attempts to diurese - monitor trend  Recent Labs  Lab 11/03/20 1241 11/03/20 1843 11/04/20 0441  CREATININE 1.10* 0.92 1.24*    DM2 A1c 5.9 - CBG controlled   Normocytic anemia Likely due to CKD  Hyponatremia Felt to be  due to CHF - monitor w/ diuresis   HTN Monitor trend without change in treatment today  HLD Continue usual home medical therapy  Chronic anxiety   Family Communication: Spoke with daughter at bedside Status is: Inpatient  Objective: Blood pressure (!) 151/77, pulse 82, temperature 98.2 F (36.8 C), temperature source Oral, resp. rate (!) 23, SpO2 93 %. No intake or output data in the 24 hours ending 11/04/20 0807 There were no vitals filed for this visit.  Examination: General: No acute respiratory distress Lungs: Clear to auscultation bilaterally without wheezes or crackles Cardiovascular: Regular rate and rhythm without murmur gallop or rub normal S1 and S2 Abdomen: Nontender, nondistended, soft, bowel sounds positive, no rebound, no ascites, no appreciable mass Extremities: No significant cyanosis, clubbing, or edema bilateral lower extremities  CBC: Recent Labs  Lab 11/03/20 1241 11/04/20 0441  WBC 6.1 4.6  NEUTROABS 4.2  --   HGB 10.5* 10.1*  HCT 32.6* 31.3*  MCV 81.5 81.5  PLT 423* 213*   Basic Metabolic Panel: Recent Labs  Lab 11/03/20 1241 11/03/20 1843 11/04/20 0441  NA 129* 133* 135  K 4.7 5.0 4.2  CL 87* 88* 90*  CO2 32 33* 38*  GLUCOSE 129* 155* 163*  BUN 15 15 20   CREATININE 1.10* 0.92 1.24*  CALCIUM 9.0 9.1 9.3   GFR: CrCl cannot be calculated (Unknown ideal weight.).  Liver Function Tests: Recent Labs  Lab 11/03/20 1241  AST 33  ALT 14  ALKPHOS 35*  BILITOT 1.0  PROT 7.2  ALBUMIN 3.8     HbA1C: Hgb A1c MFr Bld  Date/Time  Value Ref Range Status  11/03/2020 06:43 PM 5.9 (H) 4.8 - 5.6 % Final    Comment:    (NOTE) Pre diabetes:          5.7%-6.4%  Diabetes:              >6.4%  Glycemic control for   <7.0% adults with diabetes   09/16/2020 08:33 AM 5.9 (H) 4.8 - 5.6 % Final    Comment:    (NOTE) Pre diabetes:          5.7%-6.4%  Diabetes:              >6.4%  Glycemic control for   <7.0% adults with diabetes      CBG: Recent Labs  Lab 11/03/20 1748 11/03/20 2128  GLUCAP 151* 140*    Recent Results (from the past 240 hour(s))  Resp Panel by RT-PCR (Flu A&B, Covid) Nasopharyngeal Swab     Status: None   Collection Time: 11/03/20  2:24 PM   Specimen: Nasopharyngeal Swab; Nasopharyngeal(NP) swabs in vial transport medium  Result Value Ref Range Status   SARS Coronavirus 2 by RT PCR NEGATIVE NEGATIVE Final    Comment: (NOTE) SARS-CoV-2 target nucleic acids are NOT DETECTED.  The SARS-CoV-2 RNA is generally detectable in upper respiratory specimens during the acute phase of infection. The lowest concentration of SARS-CoV-2 viral copies this assay can detect is 138 copies/mL. A negative result does not preclude SARS-Cov-2 infection and should not be used as the sole basis for treatment or other patient management decisions. A negative result may occur with  improper specimen collection/handling, submission of specimen other than nasopharyngeal swab, presence of viral mutation(s) within the areas targeted by this assay, and inadequate number of viral copies(<138 copies/mL). A negative result must be combined with clinical observations, patient history, and epidemiological information. The expected result is Negative.  Fact Sheet for Patients:  EntrepreneurPulse.com.au  Fact Sheet for Healthcare Providers:  IncredibleEmployment.be  This test is no t yet approved or cleared by the [place] FDA and  has been authorized for detection and/or diagnosis of SARS-CoV-2 by FDA under an Emergency Use Authorization (EUA). This EUA will remain  in effect (meaning this test can be used) for the duration of the COVID-19 declaration under Section 564(b)(1) of the Act, 21 U.S.C.section 360bbb-3(b)(1), unless the authorization is terminated  or revoked sooner.       Influenza A by PCR NEGATIVE NEGATIVE Final   Influenza B by PCR NEGATIVE NEGATIVE Final     Comment: (NOTE) The Xpert Xpress SARS-CoV-2/FLU/RSV plus assay is intended as an aid in the diagnosis of influenza from Nasopharyngeal swab specimens and should not be used as a sole basis for treatment. Nasal washings and aspirates are unacceptable for Xpert Xpress SARS-CoV-2/FLU/RSV testing.  Fact Sheet for Patients: EntrepreneurPulse.com.au  Fact Sheet for Healthcare Providers: IncredibleEmployment.be  This test is not yet approved or cleared by the [place] FDA and has been authorized for detection and/or diagnosis of SARS-CoV-2 by FDA under an Emergency Use Authorization (EUA). This EUA will remain in effect (meaning this test can be used) for the duration of the COVID-19 declaration under Section 564(b)(1) of the Act, 21 U.S.C. section 360bbb-3(b)(1), unless the authorization is terminated or revoked.  Performed at [facility], [place] [address] [place], [place] [address]      Scheduled Meds:  amLODipine  10 mg Oral Daily   atenolol  50 mg Oral BID   furosemide  40 mg  Intravenous Q12H   insulin aspart  0-15 Units Subcutaneous TID WC   insulin aspart  0-5 Units Subcutaneous QHS   simvastatin  20 mg Oral QHS      LOS: 0 days   Cherene Altes, MD [facility] Office  [phone] Pager - Text Page per Shea Evans  If 7PM-7AM, please contact night-coverage per Amion 11/04/2020, 8:07 AM

## 2020-11-04 NOTE — ED Notes (Signed)
RN went into patient's room and her [place] ws off. O2 sats 88%. Placed back on [place] @ 3L O2 sats @ 94%

## 2020-11-04 NOTE — Progress Notes (Signed)
Pt continually taking of telemetry leads and electrodes, replaced several times, Pt taking electrode stickers off and placing on bed, continuous pulse ox remains on at this time, O2 sats 93%, on-call NP Olena Heckle aware as well as [place] aware.

## 2020-11-05 ENCOUNTER — Inpatient Hospital Stay (HOSPITAL_COMMUNITY): Payer: Medicare Other

## 2020-11-05 DIAGNOSIS — R0602 Shortness of breath: Secondary | ICD-10-CM | POA: Diagnosis not present

## 2020-11-05 DIAGNOSIS — Z9889 Other specified postprocedural states: Secondary | ICD-10-CM | POA: Diagnosis not present

## 2020-11-05 DIAGNOSIS — I5033 Acute on chronic diastolic (congestive) heart failure: Secondary | ICD-10-CM | POA: Diagnosis not present

## 2020-11-05 DIAGNOSIS — I130 Hypertensive heart and chronic kidney disease with heart failure and stage 1 through stage 4 chronic kidney disease, or unspecified chronic kidney disease: Secondary | ICD-10-CM | POA: Diagnosis not present

## 2020-11-05 DIAGNOSIS — J90 Pleural effusion, not elsewhere classified: Secondary | ICD-10-CM | POA: Diagnosis not present

## 2020-11-05 LAB — MISC LABCORP TEST (SEND OUT)
LabCorp test name: 5367
Labcorp test code: 9985

## 2020-11-05 LAB — MAGNESIUM: Magnesium: 1.7 mg/dL (ref 1.7–2.4)

## 2020-11-05 LAB — BASIC METABOLIC PANEL
Anion gap: 8 (ref 5–15)
BUN: 20 mg/dL (ref 8–23)
CO2: 36 mmol/L — ABNORMAL HIGH (ref 22–32)
Calcium: 8.7 mg/dL — ABNORMAL LOW (ref 8.9–10.3)
Chloride: 92 mmol/L — ABNORMAL LOW (ref 98–111)
Creatinine, Ser: 0.92 mg/dL (ref 0.44–1.00)
GFR, Estimated: >60 mL/min (ref >60)
Glucose, Bld: 107 mg/dL — ABNORMAL HIGH (ref 70–99)
Potassium: 3.6 mmol/L (ref 3.5–5.1)
Sodium: 136 mmol/L (ref 135–145)

## 2020-11-05 LAB — GLUCOSE, CAPILLARY
Glucose-Capillary: 116 mg/dL — ABNORMAL HIGH (ref 70–99)
Glucose-Capillary: 169 mg/dL — ABNORMAL HIGH (ref 70–99)
Glucose-Capillary: 124 mg/dL — ABNORMAL HIGH (ref 70–99)
Glucose-Capillary: 119 mg/dL — ABNORMAL HIGH (ref 70–99)

## 2020-11-05 LAB — CBC
HCT: 31.1 % — ABNORMAL LOW (ref 36.0–46.0)
Hemoglobin: 10.1 g/dL — ABNORMAL LOW (ref 12.0–15.0)
MCH: 26.2 pg (ref 26.0–34.0)
MCHC: 32.5 g/dL (ref 30.0–36.0)
MCV: 80.6 fL (ref 80.0–100.0)
Platelets: 444 10*3/uL — ABNORMAL HIGH (ref 150–400)
RBC: 3.86 MIL/uL — ABNORMAL LOW (ref 3.87–5.11)
RDW: 15.1 % (ref 11.5–15.5)
WBC: 7.7 10*3/uL (ref 4.0–10.5)
nRBC: 0 % (ref 0.0–0.2)

## 2020-11-05 LAB — CYTOLOGY - NON PAP

## 2020-11-05 MED ORDER — ACETAMINOPHEN 325 MG PO TABS: 650.0000 mg | ORAL_TABLET | Freq: Four times a day (QID) | ORAL | Status: DC | PRN

## 2020-11-05 MED ORDER — FUROSEMIDE 40 MG PO TABS
40.0000 mg | ORAL_TABLET | Freq: Two times a day (BID) | ORAL | Status: DC
  Administered 2020-11-06: 40 mg via ORAL
  Filled 2020-11-05: qty 1

## 2020-11-05 NOTE — Progress Notes (Signed)
Mandy [name]  WCH:[id number] DOB: 1940/08/21 DOA: 11/03/2020 PCP: [name], MD    Brief Narrative:  36TI with a history of diastolic CHF, chronic hypoxic respiratory failure on 2 L nasal cannula, HTN, HLD, DM2, and hyperthyroidism who presented to the ED with worsening dyspnea over several days, as well as worsening peripheral edema. O2 saturations were consistently in the mid 80s at home despite her usual 2 L oxygen dose. This would significantly worsen with the slightest movement. CXR in the ED noted an increasing pleural effusion and pulmonary edema.  Consultants:  IR  Code Status: FULL CODE  Antimicrobials:  None  DVT prophylaxis: SCDs  Subjective: Resting comfortably in bed in good spirits.  Denies new complaints.  Has not yet ambulated to a significant extent.  Daughter at bedside provides translation and reports that patient was desaturating markedly at home with even the slightest attempts to ambulate.  Denies any new complaints.  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 Possible etiology of her borderline exudative/transudative effusion  Acute diastolic CHF exacerbation -pulmonary edema TTE October 2022 noted EF of 65-70% and grade 1 diastolic dysfunction - d/c weight 73.4kg 10/09/20 -weight now back to her baseline  Filed Weights   11/04/20 0903 11/05/20 0500  Weight: 74.3 kg 73.4 kg     CKD stage IIIa Creatinine has stabilized and is currently tolerating diuretic well  Recent Labs  Lab 11/03/20 1241 11/03/20 1843  11/04/20 0441 11/05/20 0520  CREATININE 1.10* 0.92 1.24* 0.92     DM2 A1c 5.9 - CBG controlled   Normocytic anemia Likely due to CKD -hemoglobin stable  Hyponatremia Felt to be due to CHF -corrected with diuresis  HTN Well-controlled at this time  HLD Continue usual home medical therapy  Chronic anxiety Appears well compensated  Family Communication: Spoke with daughter at bedside Status is: Inpatient -check ambulatory saturations -wean oxygen -if able to ambulate without severe desaturation and be weaned from oxygen at rest can discharge home 11/4  Objective: Blood pressure (!) 113/51, pulse 69, temperature 98 F (36.7 C), temperature source Oral, resp. rate 17, height 4\' 11"  (1.499 m), weight 73.4 kg, SpO2 97 %.  Intake/Output Summary (Last 24 hours) at 11/05/2020 1046 Last data filed at 11/05/2020 0745 Gross per 24 hour  Intake 240 ml  Output 1250 ml  Net -1010 ml   Filed Weights   11/04/20 0903 11/05/20 0500  Weight: 74.3 kg 73.4 kg    Examination: General: No acute respiratory distress Lungs: Good air movement throughout all fields with no wheezing Cardiovascular: RRR without murmur or rub Abdomen: Nontender, nondistended, soft, bowel sounds positive, no rebound Extremities: No significant cyanosis, clubbing, or edema bilateral lower extremities  CBC: Recent Labs  Lab 11/03/20 1241 11/04/20 0441 11/05/20 0520  WBC 6.1 4.6 7.7  NEUTROABS 4.2  --   --   HGB 10.5* 10.1* 10.1*  HCT 32.6* 31.3* 31.1*  MCV 81.5 81.5 80.6  PLT 423* 447* 444*    Basic Metabolic Panel: Recent Labs  Lab 11/03/20 1843 11/04/20 0441 11/05/20 0520  NA 133* 135 136  K 5.0 4.2  3.6  CL 88* 90* 92*  CO2 33* 38* 36*  GLUCOSE 155* 163* 107*  BUN 15 20 20   CREATININE 0.92 1.24* 0.92  CALCIUM 9.1 9.3 8.7*  MG  --   --  1.7    GFR: Estimated Creatinine Clearance: 43.3 mL/min (by C-G formula based on SCr of 0.92 mg/dL).  Liver Function Tests: Recent Labs  Lab  11/03/20 1241  AST 33  ALT 14  ALKPHOS 35*  BILITOT 1.0  PROT 7.2  ALBUMIN 3.8      HbA1C: Hgb A1c MFr Bld  Date/Time Value Ref Range Status  11/03/2020 06:43 PM 5.9 (H) 4.8 - 5.6 % Final    Comment:    (NOTE) Pre diabetes:          5.7%-6.4%  Diabetes:              >6.4%  Glycemic control for   <7.0% adults with diabetes   09/16/2020 08:33 AM 5.9 (H) 4.8 - 5.6 % Final    Comment:    (NOTE) Pre diabetes:          5.7%-6.4%  Diabetes:              >6.4%  Glycemic control for   <7.0% adults with diabetes     CBG: Recent Labs  Lab 11/03/20 2128 11/04/20 0815 11/04/20 1721 11/04/20 2131 11/05/20 0809  GLUCAP 140* 149* 111* 140* 116*     Recent Results (from the past 240 hour(s))  Resp Panel by RT-PCR (Flu A&B, Covid) Nasopharyngeal Swab     Status: None   Collection Time: 11/03/20  2:24 PM   Specimen: Nasopharyngeal Swab; Nasopharyngeal(NP) swabs in vial transport medium  Result Value Ref Range Status   SARS Coronavirus 2 by RT PCR NEGATIVE NEGATIVE Final    Comment: (NOTE) SARS-CoV-2 target nucleic acids are NOT DETECTED.  The SARS-CoV-2 RNA is generally detectable in upper respiratory specimens during the acute phase of infection. The lowest concentration of SARS-CoV-2 viral copies this assay can detect is 138 copies/mL. A negative result does not preclude SARS-Cov-2 infection and should not be used as the sole basis for treatment or other patient management decisions. A negative result may occur with  improper specimen collection/handling, submission of specimen other than nasopharyngeal swab, presence of viral mutation(s) within the areas targeted by this assay, and inadequate number of viral copies(<138 copies/mL). A negative result must be combined with clinical observations, patient history, and epidemiological information. The expected result is Negative.  Fact Sheet for Patients:  EntrepreneurPulse.com.au  Fact Sheet for  Healthcare Providers:  IncredibleEmployment.be  This test is no t yet approved or cleared by the [place] FDA and  has been authorized for detection and/or diagnosis of SARS-CoV-2 by FDA under an Emergency Use Authorization (EUA). This EUA will remain  in effect (meaning this test can be used) for the duration of the COVID-19 declaration under Section 564(b)(1) of the Act, 21 U.S.C.section 360bbb-3(b)(1), unless the authorization is terminated  or revoked sooner.       Influenza A by PCR NEGATIVE NEGATIVE Final   Influenza B by PCR NEGATIVE NEGATIVE Final    Comment: (NOTE) The Xpert Xpress SARS-CoV-2/FLU/RSV plus assay is intended as an aid in the diagnosis of influenza from Nasopharyngeal swab specimens and should not be used as a sole basis for treatment. Nasal washings and aspirates are unacceptable for Xpert Xpress SARS-CoV-2/FLU/RSV testing.  Fact Sheet for Patients: EntrepreneurPulse.com.au  Fact Sheet for Healthcare Providers: IncredibleEmployment.be  This  test is not yet approved or cleared by the [place] and has been authorized for detection and/or diagnosis of SARS-CoV-2 by FDA under an Emergency Use Authorization (EUA). This EUA will remain in effect (meaning this test can be used) for the duration of the COVID-19 declaration under Section 564(b)(1) of the Act, 21 U.S.C. section 360bbb-3(b)(1), unless the authorization is terminated or revoked.  Performed at [facility], [place] [address] [place], [place] [address]   Body fluid culture w Gram Stain     Status: None (Preliminary result)   Collection Time: 11/04/20 12:28 PM   Specimen: PATH Cytology Pleural fluid  Result Value Ref Range Status   Specimen Description   Final    PLEURAL Performed at [place] [address] [place], [place] [address]    Special Requests   Final    NONE Performed at  [facility], [place] [address], [place], [place] [address]    Gram Stain   Final    NO SQUAMOUS EPITHELIAL CELLS SEEN FEW WBC SEEN NO ORGANISMS SEEN    Culture   Final    NO GROWTH < 12 HOURS Performed at [facility], [place] [address], [place], [place] [address]    Report Status PENDING  Incomplete      Scheduled Meds:  vitamin C  500 mg Oral Daily   atenolol  50 mg Oral BID   cholecalciferol  1,000 Units Oral Daily   furosemide  40 mg Intravenous Q12H   gabapentin  100 mg Oral QHS   hydrALAZINE  50 mg Oral TID   insulin aspart  0-15 Units Subcutaneous TID WC   insulin aspart  0-5 Units Subcutaneous QHS   methimazole  2.5 mg Oral Daily   multivitamin with minerals  1 tablet Oral Daily   simvastatin  20 mg Oral QHS   vitamin B-12  1,000 mcg Oral Daily      LOS: 1 day   Cherene Altes, MD [facility] Office  [phone] Pager - Text Page per Shea Evans  If 7PM-7AM, please contact night-coverage per Amion 11/05/2020, 10:46 AM

## 2020-11-05 NOTE — Progress Notes (Signed)
SATURATION QUALIFICATIONS: (This note is used to comply with regulatory documentation for home oxygen)  Patient Saturations on Room Air at Rest = 92%  Patient Saturations on Room Air while Ambulating = 85%  Patient Saturations on Liters of oxygen while Ambulating = %  Please briefly explain why patient needs home oxygen:

## 2020-11-06 DIAGNOSIS — J90 Pleural effusion, not elsewhere classified: Secondary | ICD-10-CM | POA: Diagnosis not present

## 2020-11-06 DIAGNOSIS — R0602 Shortness of breath: Secondary | ICD-10-CM | POA: Diagnosis not present

## 2020-11-06 DIAGNOSIS — I509 Heart failure, unspecified: Secondary | ICD-10-CM | POA: Diagnosis not present

## 2020-11-06 DIAGNOSIS — Z9889 Other specified postprocedural states: Secondary | ICD-10-CM | POA: Diagnosis not present

## 2020-11-06 MED ORDER — ACETAMINOPHEN 325 MG PO TABS: 650.0000 mg | ORAL_TABLET | Freq: Four times a day (QID) | ORAL | Status: DC | PRN

## 2020-11-06 NOTE — TOC Transition Note (Signed)
Transition of Care [facility]) - CM/SW Discharge Note   Patient Details  Name: Mandy [name] MRN: [id number] Date of Birth: 1940-09-27  Transition of [facility]) CM/SW Contact:  Dessa Phi, RN Phone Number: 11/06/2020, 1:24 PM   Clinical Narrative: Qualifies for home 02-MD aware of home 02 order to state either continuous or with wxertion, also to add humidified air to order. Family will transport home on own. Awaiting [employer] rep Zelphia Cairo to deliver home 0-2 travel tank to rm prior d/c. No further CM needs.       Final next level of care: Home/Self Care Barriers to Discharge: No Barriers Identified   Patient Goals and CMS Choice Patient states their goals for this hospitalization and ongoing recovery are:: go home CMS Medicare.gov Compare Post Acute Care list provided to:: Patient Represenative (must comment) Choice offered to / list presented to : Adult Children  Discharge Placement                       Discharge Plan and Services   Discharge Planning Services: CM Consult            DME Arranged: Oxygen DME Agency: [employer] Date DME Agency Contacted: 11/06/20 Time DME Agency Contacted: [phone] Representative spoke with at DME Agency: [place] ([place]) Interventions     Readmission Risk Interventions Readmission Risk Prevention Plan 11/06/2020  Transportation Screening Complete  PCP or Specialist Appt within 3-5 Days Complete  HRI or [place] Complete  Social Work Consult for [place] Planning/Counseling Complete  Palliative Care Screening Complete  Medication Review Press photographer) Complete  Some recent data might be hidden

## 2020-11-06 NOTE — Progress Notes (Signed)
SATURATION QUALIFICATIONS: (This note is used to comply with regulatory documentation for home oxygen)  Patient Saturations on Room Air at Rest =   Patient Saturations on Room Air while Ambulating =   Patient Saturations on 2 Liters of oxygen while Ambulating = 90%  Please briefly explain why patient needs home oxygen:

## 2020-11-06 NOTE — Discharge Summary (Signed)
DISCHARGE SUMMARY  Mandy [name]  MR#: [id number]  DOB:08-16-1940  Date of Admission: 11/03/2020 Date of Discharge: 11/06/2020  Attending Physician:Ozan Maclay Hennie Duos, MD  Patient's HAL:[id number], Christean Grief, MD  Consults: IR  Disposition: D/C home   Follow-up Appts:  Follow-up Information     [name], MD. Schedule an appointment as soon as possible for a visit in 1 week(s).   Specialty: Internal Medicine Contact information: [place] [address] [phone]         [facility] Follow up.   Why: home oxygen w/humidified air. 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 O2 saturations were consistently in the mid 80s at home despite her usual 2 L oxygen dose. This would significantly worsen with the slightest movement. CXR in the ED noted an increasing pleural effusion and pulmonary edema.  Hospital Course:  Acute exacerbation of chronic hypoxic respiratory failure Related primarily to pulmonary edema and pleural effusion -much improved status post drainage of effusion -weaned oxygen as able - home oxygen arranged previously  - ambulatory saturations on day of d/c as follows:  Patient Saturations on Room Air at Rest =  89% Patient Saturations on Room Air while Ambulating = 82% Patient Saturations on 2 Liters of oxygen while Ambulating = 98%  Right Pleural effusion Status post thoracentesis in IR 11/2 -pleural fluid studies note fluid/serum protein of 0.53 - LDH not obtained on pleural fluid - fluid cytology noted only reactive mesothelial cells   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 Possible etiology of her borderline exudative/transudative effusion -follow-up with CVTS already established   Acute diastolic CHF exacerbation -pulmonary edema TTE October 2022 noted EF of 65-70% and grade 1 diastolic dysfunction - d/c weight 73.4kg 10/09/20 -weight now back to her baseline -no gross volume overload at time of discharge  Filed Weights   11/04/20 0903 11/05/20 0500 11/06/20 0500  Weight: 74.3 kg 73.4 kg 74 kg     CKD stage IIIa Creatinine stabilized - tolerating diuretic well  Recent Labs  Lab 11/03/20 1241 11/03/20 1843 11/04/20 0441 11/05/20 0520  CREATININE 1.10* 0.92 1.24* 0.92    DM2 A1c 5.9 - CBG controlled    Normocytic anemia Likely due to CKD -hemoglobin stable   Hyponatremia Felt to be due to CHF -corrected with diuresis   HTN Reasonably well-controlled at time of discharge   HLD Continue usual home medical therapy   Chronic anxiety Appears well compensated    Allergies as of 11/06/2020   No Known Allergies      Medication List     STOP taking these medications    amLODipine 10 MG tablet Commonly known as: NORVASC       TAKE these medications    acetaminophen 325 MG tablet Commonly known as: TYLENOL Take  2 tablets (650 mg total) by mouth every 6 (six) hours as needed for mild pain, fever or headache.   albuterol 108 (90 Base) MCG/ACT inhaler Commonly known as: VENTOLIN HFA Inhale 2 puffs into the lungs every 6 (six) hours as needed for wheezing or shortness of breath.   alendronate 70 MG tablet Commonly known as:  FOSAMAX Take 70 mg by mouth every Tuesday. Take with a full glass of water on an empty stomach.   aspirin EC 81 MG tablet Take 81 mg by mouth daily. Swallow whole.   atenolol 50 MG tablet Commonly known as: TENORMIN Take 50 mg by mouth 2 (two) times daily.   calcium carbonate 1500 (600 Ca) MG Tabs tablet Commonly known as: OSCAL Take 600 mg of elemental calcium by mouth 2 (two) times daily with a meal.   diazepam 2 MG tablet Commonly known as: VALIUM Take 2 mg by mouth every 12 (twelve) hours as needed for anxiety.   diclofenac sodium 1 % Gel Commonly known as: VOLTAREN Apply 4 g topically 4 (four) times daily as needed for pain. shoulder   furosemide 40 MG tablet Commonly known as: LASIX Take 1 tablet (40 mg total) by mouth 2 (two) times daily.   gabapentin 100 MG capsule Commonly known as: NEURONTIN Take 100 mg by mouth at bedtime.   guaiFENesin 600 MG 12 hr tablet Commonly known as: MUCINEX Take 600 mg by mouth 2 (two) times daily as needed for cough.   hydrALAZINE 100 MG tablet Commonly known as: APRESOLINE Take 0.5 tablets (50 mg total) by mouth 3 (three) times daily.   MELATONIN ER PO Take 2 tablets by mouth at bedtime as needed (sleep).   metFORMIN 500 MG tablet Commonly known as: GLUCOPHAGE Take 500 mg by mouth 2 (two) times daily with a meal.   methimazole 5 MG tablet Commonly known as: TAPAZOLE Take 0.5 tablets (2.5 mg total) by mouth daily.   multivitamin with minerals Tabs tablet Take 1 tablet by mouth daily.   pantoprazole 40 MG tablet Commonly known as: PROTONIX Take 1 tablet (40 mg total) by mouth daily at 6 (six) AM. What changed: when to take this   simvastatin 20 MG tablet Commonly known as: ZOCOR Take 1 tablet (20 mg total) by mouth at bedtime.   traMADol 50 MG tablet Commonly known as: ULTRAM Take 1 tablet (50 mg total) by mouth every 6 (six) hours as needed (mild pain). What changed: reasons to take this   vitamin B-12 1000 MCG  tablet Commonly known as: CYANOCOBALAMIN Take 1,000 mcg by mouth daily.   vitamin C 500 MG tablet Commonly known as: ASCORBIC ACID Take 500 mg by mouth daily.   Vitamin D (Cholecalciferol) 25 MCG (1000 UT) Tabs Take 1,000 Units by mouth daily.   zinc gluconate 50 MG tablet Take 50 mg by mouth daily.               Durable Medical Equipment  (From admission, onward)           Start     Ordered   11/06/20 1327  For home use only DME oxygen  Once       Comments: Add humidity if possible please.  Question Answer Comment  Length of Need Lifetime   Mode or (Route) Nasal cannula   Liters per Minute 2   Frequency Continuous (stationary and portable oxygen unit needed)   Oxygen conserving device Yes   Oxygen delivery system Gas  11/06/20 1326            Day of Discharge BP 134/82 (BP Location: Right Arm)   Pulse 72   Temp 98 F (36.7 C) (Oral)   Resp 20   Ht 4\' 11"  (1.499 m)   Wt 74 kg   SpO2 99%   BMI 32.95 kg/m   Physical Exam: General: No acute respiratory distress Lungs: Clear to auscultation bilaterally without wheezes or crackles Cardiovascular: Regular rate and rhythm without murmur gallop or rub normal S1 and S2 Abdomen: Nontender, nondistended, soft, bowel sounds positive, no rebound, no ascites, no appreciable mass Extremities: No significant cyanosis, clubbing, or edema bilateral lower extremities  Basic Metabolic Panel: Recent Labs  Lab 11/03/20 1241 11/03/20 1843 11/04/20 0441 11/05/20 0520  NA 129* 133* 135 136  K 4.7 5.0 4.2 3.6  CL 87* 88* 90* 92*  CO2 32 33* 38* 36*  GLUCOSE 129* 155* 163* 107*  BUN 15 15 20 20   CREATININE 1.10* 0.92 1.24* 0.92  CALCIUM 9.0 9.1 9.3 8.7*  MG  --   --   --  1.7    Liver Function Tests: Recent Labs  Lab 11/03/20 1241  AST 33  ALT 14  ALKPHOS 35*  BILITOT 1.0  PROT 7.2  ALBUMIN 3.8    CBC: Recent Labs  Lab 11/03/20 1241 11/04/20 0441 11/05/20 0520  WBC 6.1 4.6 7.7   NEUTROABS 4.2  --   --   HGB 10.5* 10.1* 10.1*  HCT 32.6* 31.3* 31.1*  MCV 81.5 81.5 80.6  PLT 423* 447* 444*    CBG: Recent Labs  Lab 11/04/20 2131 11/05/20 0809 11/05/20 1146 11/05/20 1706 11/05/20 2126  GLUCAP 140* 116* 169* 124* 119*    Recent Results (from the past 240 hour(s))  Resp Panel by RT-PCR (Flu A&B, Covid) Nasopharyngeal Swab     Status: None   Collection Time: 11/03/20  2:24 PM   Specimen: Nasopharyngeal Swab; Nasopharyngeal(NP) swabs in vial transport medium  Result Value Ref Range Status   SARS Coronavirus 2 by RT PCR NEGATIVE NEGATIVE Final    Comment: (NOTE) SARS-CoV-2 target nucleic acids are NOT DETECTED.  The SARS-CoV-2 RNA is generally detectable in upper respiratory specimens during the acute phase of infection. The lowest concentration of SARS-CoV-2 viral copies this assay can detect is 138 copies/mL. A negative result does not preclude SARS-Cov-2 infection and should not be used as the sole basis for treatment or other patient management decisions. A negative result may occur with  improper specimen collection/handling, submission of specimen other than nasopharyngeal swab, presence of viral mutation(s) within the areas targeted by this assay, and inadequate number of viral copies(<138 copies/mL). A negative result must be combined with clinical observations, patient history, and epidemiological information. The expected result is Negative.  Fact Sheet for Patients:  EntrepreneurPulse.com.au  Fact Sheet for Healthcare Providers:  IncredibleEmployment.be  This test is no t yet approved or cleared by the [place] FDA and  has been authorized for detection and/or diagnosis of SARS-CoV-2 by FDA under an Emergency Use Authorization (EUA). This EUA will remain  in effect (meaning this test can be used) for the duration of the COVID-19 declaration under Section 564(b)(1) of the Act, 21 U.S.C.section  360bbb-3(b)(1), unless the authorization is terminated  or revoked sooner.       Influenza A by PCR NEGATIVE NEGATIVE Final   Influenza B by PCR NEGATIVE NEGATIVE Final    Comment: (NOTE) The Xpert Xpress SARS-CoV-2/FLU/RSV plus assay is  intended as an aid in the diagnosis of influenza from Nasopharyngeal swab specimens and should not be used as a sole basis for treatment. Nasal washings and aspirates are unacceptable for Xpert Xpress SARS-CoV-2/FLU/RSV testing.  Fact Sheet for Patients: EntrepreneurPulse.com.au  Fact Sheet for Healthcare Providers: IncredibleEmployment.be  This test is not yet approved or cleared by the [place] FDA and has been authorized for detection and/or diagnosis of SARS-CoV-2 by FDA under an Emergency Use Authorization (EUA). This EUA will remain in effect (meaning this test can be used) for the duration of the COVID-19 declaration under Section 564(b)(1) of the Act, 21 U.S.C. section 360bbb-3(b)(1), unless the authorization is terminated or revoked.  Performed at [facility], [place] [address], [place], [place] [address]   Body fluid culture w Gram Stain     Status: None (Preliminary result)   Collection Time: 11/04/20 12:28 PM   Specimen: PATH Cytology Pleural fluid  Result Value Ref Range Status   Specimen Description   Final    PLEURAL Performed at [place] [address] [place], [place] [address]    Special Requests   Final    NONE Performed at [facility], [place] [address], [place], [place] [address]    Gram Stain   Final    NO SQUAMOUS EPITHELIAL CELLS SEEN FEW WBC SEEN NO ORGANISMS SEEN    Culture   Final    NO GROWTH 2 DAYS Performed at [facility], [place] [address] [place], [place] [address]    Report Status PENDING  Incomplete      Time spent in discharge (includes decision making & examination of pt): 35  minutes  11/06/2020, 2:45 PM   Cherene Altes, MD [facility] Office  [phone]

## 2020-11-06 NOTE — Progress Notes (Signed)
SATURATION QUALIFICATIONS: (This note is used to comply with regulatory documentation for home oxygen)   Patient Saturations on Room Air at Rest = 89%   Patient Saturations on Room Air while Ambulating = 82%   Patient Saturations on 2 Liters of oxygen while Ambulating = 98%

## 2020-11-08 LAB — BODY FLUID CULTURE W GRAM STAIN
Culture: NO GROWTH
Gram Stain: NONE SEEN

## 2020-11-10 LAB — CD19 AND CD20, FLOW CYTOMETRY

## 2020-11-18 ENCOUNTER — Other Ambulatory Visit: Payer: Self-pay | Admitting: Thoracic Surgery (Cardiothoracic Vascular Surgery)

## 2020-11-18 DIAGNOSIS — J9859 Other diseases of mediastinum, not elsewhere classified: Secondary | ICD-10-CM

## 2020-11-20 ENCOUNTER — Ambulatory Visit
Admission: RE | Admit: 2020-11-20 | Discharge: 2020-11-20 | Disposition: A | Discharge status: Home / Self Care | Payer: Medicare HMO | Source: Ambulatory Visit | Attending: Thoracic Surgery (Cardiothoracic Vascular Surgery) | Admitting: Thoracic Surgery (Cardiothoracic Vascular Surgery)

## 2020-11-20 ENCOUNTER — Ambulatory Visit (INDEPENDENT_AMBULATORY_CARE_PROVIDER_SITE_OTHER): Payer: Self-pay | Admitting: Thoracic Surgery (Cardiothoracic Vascular Surgery)

## 2020-11-20 ENCOUNTER — Other Ambulatory Visit: Payer: Self-pay

## 2020-11-20 ENCOUNTER — Ambulatory Visit: Payer: Medicare HMO | Admitting: Thoracic Surgery (Cardiothoracic Vascular Surgery)

## 2020-11-20 VITALS — BP 164/73 | HR 77 | Resp 20 | Ht 59.0 in | Wt 167.0 lb

## 2020-11-20 DIAGNOSIS — J9859 Other diseases of mediastinum, not elsewhere classified: Secondary | ICD-10-CM

## 2020-11-20 DIAGNOSIS — Z4889 Encounter for other specified surgical aftercare: Secondary | ICD-10-CM

## 2020-11-20 DIAGNOSIS — D4989 Neoplasm of unspecified behavior of other specified sites: Secondary | ICD-10-CM

## 2020-11-20 NOTE — Progress Notes (Signed)
      [address]       [place] [address]             [phone]        Mandy [name] [facility] [place] Medical Record #[id number] Date of Birth: 1940/03/26  Referring: Bernerd Limbo, MD Primary Care: [name], MD Primary Cardiologist:None  Reason for visit:   follow-up  History of Present Illness:     Mandy [name] comes in for hospital follow-up.  She remains short of breath and on supplemental oxygen.  She still is not doing much.  Her daughter states that every time she gets up her oxygen level drops.  Physical Exam: BP (!) 164/73 (BP Location: Right Arm, Patient Position: Sitting)   Pulse 77   Resp 20   Ht 4\' 11"  (1.499 m)   Wt 167 lb (75.8 kg)   SpO2 96% Comment: 2L O2 [place]  BMI 33.73 kg/m   Alert NAD In wheelchair. Easy work of breathing  Diagnostic Studies & Laboratory data: CXR: Right effusion     Assessment / Plan:   80 year-old female status post robotic assisted mediastinal mass resection.  She is quite deconditioned remains short of breath.  Her chest x-ray does show a moderate sized right pleural effusion and she remains on supplemental oxygen.  I do think that a thoracentesis would potentially be beneficial, but I also explained to her and her daughter that she must continue her pulmonary hygiene and try to be more active.  I will see her back in 1 month with another chest x-ray.  I will refer her to Dr. Valeta Harms for thoracentesis.   Mandy [name] 11/20/2020 3:30 PM

## 2020-11-23 ENCOUNTER — Other Ambulatory Visit: Payer: Self-pay

## 2020-11-23 ENCOUNTER — Emergency Department (HOSPITAL_BASED_OUTPATIENT_CLINIC_OR_DEPARTMENT_OTHER): Payer: Medicare HMO

## 2020-11-23 ENCOUNTER — Encounter (HOSPITAL_BASED_OUTPATIENT_CLINIC_OR_DEPARTMENT_OTHER): Payer: Self-pay

## 2020-11-23 ENCOUNTER — Inpatient Hospital Stay (HOSPITAL_BASED_OUTPATIENT_CLINIC_OR_DEPARTMENT_OTHER)
Admission: EM | Admit: 2020-11-23 | Discharge: 2020-12-08 | DRG: 286 | Disposition: A | Discharge status: Home Health | Payer: Medicare HMO | Attending: Internal Medicine | Admitting: Internal Medicine

## 2020-11-23 DIAGNOSIS — I6330 Cerebral infarction due to thrombosis of unspecified cerebral artery: Secondary | ICD-10-CM | POA: Insufficient documentation

## 2020-11-23 DIAGNOSIS — N179 Acute kidney failure, unspecified: Secondary | ICD-10-CM | POA: Diagnosis not present

## 2020-11-23 DIAGNOSIS — J90 Pleural effusion, not elsewhere classified: Secondary | ICD-10-CM | POA: Diagnosis not present

## 2020-11-23 DIAGNOSIS — J9611 Chronic respiratory failure with hypoxia: Secondary | ICD-10-CM | POA: Diagnosis not present

## 2020-11-23 DIAGNOSIS — Z515 Encounter for palliative care: Secondary | ICD-10-CM | POA: Diagnosis not present

## 2020-11-23 DIAGNOSIS — N1831 Chronic kidney disease, stage 3a: Secondary | ICD-10-CM | POA: Diagnosis present

## 2020-11-23 DIAGNOSIS — I25110 Atherosclerotic heart disease of native coronary artery with unstable angina pectoris: Secondary | ICD-10-CM | POA: Diagnosis not present

## 2020-11-23 DIAGNOSIS — Z20822 Contact with and (suspected) exposure to covid-19: Secondary | ICD-10-CM | POA: Diagnosis present

## 2020-11-23 DIAGNOSIS — E785 Hyperlipidemia, unspecified: Secondary | ICD-10-CM | POA: Diagnosis present

## 2020-11-23 DIAGNOSIS — I5043 Acute on chronic combined systolic (congestive) and diastolic (congestive) heart failure: Secondary | ICD-10-CM | POA: Diagnosis present

## 2020-11-23 DIAGNOSIS — R0603 Acute respiratory distress: Secondary | ICD-10-CM

## 2020-11-23 DIAGNOSIS — E662 Morbid (severe) obesity with alveolar hypoventilation: Secondary | ICD-10-CM | POA: Diagnosis present

## 2020-11-23 DIAGNOSIS — I5033 Acute on chronic diastolic (congestive) heart failure: Secondary | ICD-10-CM | POA: Diagnosis not present

## 2020-11-23 DIAGNOSIS — Z79899 Other long term (current) drug therapy: Secondary | ICD-10-CM

## 2020-11-23 DIAGNOSIS — E0590 Thyrotoxicosis, unspecified without thyrotoxic crisis or storm: Secondary | ICD-10-CM | POA: Diagnosis present

## 2020-11-23 DIAGNOSIS — M1990 Unspecified osteoarthritis, unspecified site: Secondary | ICD-10-CM | POA: Diagnosis present

## 2020-11-23 DIAGNOSIS — Z8616 Personal history of COVID-19: Secondary | ICD-10-CM | POA: Diagnosis not present

## 2020-11-23 DIAGNOSIS — F419 Anxiety disorder, unspecified: Secondary | ICD-10-CM

## 2020-11-23 DIAGNOSIS — J9621 Acute and chronic respiratory failure with hypoxia: Secondary | ICD-10-CM | POA: Diagnosis present

## 2020-11-23 DIAGNOSIS — Z6835 Body mass index (BMI) 35.0-35.9, adult: Secondary | ICD-10-CM | POA: Diagnosis not present

## 2020-11-23 DIAGNOSIS — J9601 Acute respiratory failure with hypoxia: Secondary | ICD-10-CM | POA: Diagnosis not present

## 2020-11-23 DIAGNOSIS — E8729 Other acidosis: Secondary | ICD-10-CM | POA: Diagnosis present

## 2020-11-23 DIAGNOSIS — Z91199 Patient's noncompliance with other medical treatment and regimen due to unspecified reason: Secondary | ICD-10-CM

## 2020-11-23 DIAGNOSIS — T502X5A Adverse effect of carbonic-anhydrase inhibitors, benzothiadiazides and other diuretics, initial encounter: Secondary | ICD-10-CM | POA: Diagnosis not present

## 2020-11-23 DIAGNOSIS — I10 Essential (primary) hypertension: Secondary | ICD-10-CM | POA: Diagnosis present

## 2020-11-23 DIAGNOSIS — Z9981 Dependence on supplemental oxygen: Secondary | ICD-10-CM | POA: Diagnosis not present

## 2020-11-23 DIAGNOSIS — E876 Hypokalemia: Secondary | ICD-10-CM | POA: Diagnosis not present

## 2020-11-23 DIAGNOSIS — I110 Hypertensive heart disease with heart failure: Secondary | ICD-10-CM | POA: Diagnosis present

## 2020-11-23 DIAGNOSIS — K219 Gastro-esophageal reflux disease without esophagitis: Secondary | ICD-10-CM | POA: Diagnosis present

## 2020-11-23 DIAGNOSIS — R2990 Unspecified symptoms and signs involving the nervous system: Secondary | ICD-10-CM | POA: Diagnosis not present

## 2020-11-23 DIAGNOSIS — D649 Anemia, unspecified: Secondary | ICD-10-CM | POA: Diagnosis not present

## 2020-11-23 DIAGNOSIS — R079 Chest pain, unspecified: Secondary | ICD-10-CM

## 2020-11-23 DIAGNOSIS — J9602 Acute respiratory failure with hypercapnia: Secondary | ICD-10-CM

## 2020-11-23 DIAGNOSIS — Z9841 Cataract extraction status, right eye: Secondary | ICD-10-CM

## 2020-11-23 DIAGNOSIS — Z7982 Long term (current) use of aspirin: Secondary | ICD-10-CM

## 2020-11-23 DIAGNOSIS — I272 Pulmonary hypertension, unspecified: Secondary | ICD-10-CM | POA: Diagnosis not present

## 2020-11-23 DIAGNOSIS — I63511 Cerebral infarction due to unspecified occlusion or stenosis of right middle cerebral artery: Secondary | ICD-10-CM | POA: Diagnosis not present

## 2020-11-23 DIAGNOSIS — I2510 Atherosclerotic heart disease of native coronary artery without angina pectoris: Secondary | ICD-10-CM | POA: Diagnosis present

## 2020-11-23 DIAGNOSIS — K760 Fatty (change of) liver, not elsewhere classified: Secondary | ICD-10-CM | POA: Diagnosis present

## 2020-11-23 DIAGNOSIS — Z7983 Long term (current) use of bisphosphonates: Secondary | ICD-10-CM

## 2020-11-23 DIAGNOSIS — J9622 Acute and chronic respiratory failure with hypercapnia: Secondary | ICD-10-CM | POA: Diagnosis present

## 2020-11-23 DIAGNOSIS — Z7189 Other specified counseling: Secondary | ICD-10-CM | POA: Diagnosis not present

## 2020-11-23 DIAGNOSIS — M810 Age-related osteoporosis without current pathological fracture: Secondary | ICD-10-CM | POA: Diagnosis present

## 2020-11-23 DIAGNOSIS — Z9889 Other specified postprocedural states: Secondary | ICD-10-CM

## 2020-11-23 DIAGNOSIS — R590 Localized enlarged lymph nodes: Secondary | ICD-10-CM | POA: Diagnosis present

## 2020-11-23 DIAGNOSIS — I700 Atherosclerosis of aorta: Secondary | ICD-10-CM | POA: Diagnosis present

## 2020-11-23 DIAGNOSIS — Z961 Presence of intraocular lens: Secondary | ICD-10-CM | POA: Diagnosis present

## 2020-11-23 DIAGNOSIS — R0602 Shortness of breath: Secondary | ICD-10-CM

## 2020-11-23 DIAGNOSIS — D638 Anemia in other chronic diseases classified elsewhere: Secondary | ICD-10-CM | POA: Diagnosis present

## 2020-11-23 DIAGNOSIS — I2721 Secondary pulmonary arterial hypertension: Secondary | ICD-10-CM | POA: Diagnosis present

## 2020-11-23 DIAGNOSIS — G629 Polyneuropathy, unspecified: Secondary | ICD-10-CM | POA: Diagnosis present

## 2020-11-23 DIAGNOSIS — E119 Type 2 diabetes mellitus without complications: Secondary | ICD-10-CM | POA: Diagnosis present

## 2020-11-23 DIAGNOSIS — J811 Chronic pulmonary edema: Secondary | ICD-10-CM | POA: Diagnosis present

## 2020-11-23 DIAGNOSIS — Z9842 Cataract extraction status, left eye: Secondary | ICD-10-CM

## 2020-11-23 DIAGNOSIS — E871 Hypo-osmolality and hyponatremia: Secondary | ICD-10-CM | POA: Diagnosis present

## 2020-11-23 DIAGNOSIS — R0902 Hypoxemia: Secondary | ICD-10-CM

## 2020-11-23 LAB — CBC WITH DIFFERENTIAL/PLATELET
Abs Immature Granulocytes: 0.02 10*3/uL (ref 0.00–0.07)
Basophils Absolute: 0 10*3/uL (ref 0.0–0.1)
Basophils Relative: 1 %
Eosinophils Absolute: 0 10*3/uL (ref 0.0–0.5)
Eosinophils Relative: 1 %
HCT: 31.4 % — ABNORMAL LOW (ref 36.0–46.0)
Hemoglobin: 9.9 g/dL — ABNORMAL LOW (ref 12.0–15.0)
Immature Granulocytes: 0 %
Lymphocytes Relative: 22 %
Lymphs Abs: 1.2 10*3/uL (ref 0.7–4.0)
MCH: 25.8 pg — ABNORMAL LOW (ref 26.0–34.0)
MCHC: 31.5 g/dL (ref 30.0–36.0)
MCV: 81.8 fL (ref 80.0–100.0)
Monocytes Absolute: 0.5 10*3/uL (ref 0.1–1.0)
Monocytes Relative: 9 %
Neutro Abs: 3.7 10*3/uL (ref 1.7–7.7)
Neutrophils Relative %: 67 %
Platelets: 318 10*3/uL (ref 150–400)
RBC: 3.84 MIL/uL — ABNORMAL LOW (ref 3.87–5.11)
RDW: 15.9 % — ABNORMAL HIGH (ref 11.5–15.5)
WBC: 5.5 10*3/uL (ref 4.0–10.5)
nRBC: 0 % (ref 0.0–0.2)

## 2020-11-23 LAB — I-STAT VENOUS BLOOD GAS, ED
Acid-Base Excess: 12 mmol/L — ABNORMAL HIGH (ref 0.0–2.0)
Acid-Base Excess: 12 mmol/L — ABNORMAL HIGH (ref 0.0–2.0)
Bicarbonate: 42 mmol/L — ABNORMAL HIGH (ref 20.0–28.0)
Bicarbonate: 42 mmol/L — ABNORMAL HIGH (ref 20.0–28.0)
Calcium, Ion: 1.22 mmol/L (ref 1.15–1.40)
Calcium, Ion: 1.2 mmol/L (ref 1.15–1.40)
HCT: 34 % — ABNORMAL LOW (ref 36.0–46.0)
HCT: 34 % — ABNORMAL LOW (ref 36.0–46.0)
Hemoglobin: 11.6 g/dL — ABNORMAL LOW (ref 12.0–15.0)
Hemoglobin: 11.6 g/dL — ABNORMAL LOW (ref 12.0–15.0)
O2 Saturation: 71 %
O2 Saturation: 46 %
Patient temperature: 98.6
Patient temperature: 98.6
Potassium: 4.1 mmol/L (ref 3.5–5.1)
Potassium: 3.9 mmol/L (ref 3.5–5.1)
Sodium: 134 mmol/L — ABNORMAL LOW (ref 135–145)
Sodium: 134 mmol/L — ABNORMAL LOW (ref 135–145)
TCO2: 45 mmol/L — ABNORMAL HIGH (ref 22–32)
TCO2: 44 mmol/L — ABNORMAL HIGH (ref 22–32)
pCO2, Ven: 85 mmHg (ref 44.0–60.0)
pCO2, Ven: 84.2 mmHg (ref 44.0–60.0)
pH, Ven: 7.303 (ref 7.250–7.430)
pH, Ven: 7.305 (ref 7.250–7.430)
pO2, Ven: 44 mmHg (ref 32.0–45.0)
pO2, Ven: 30 mmHg — CL (ref 32.0–45.0)

## 2020-11-23 LAB — BLOOD GAS, ARTERIAL
Acid-Base Excess: 9.7 mmol/L — ABNORMAL HIGH (ref 0.0–2.0)
Bicarbonate: 36.5 mmol/L — ABNORMAL HIGH (ref 20.0–28.0)
FIO2: 32
O2 Saturation: 95.5 %
Patient temperature: 98.6
pCO2 arterial: 65.5 mmHg (ref 32.0–48.0)
pH, Arterial: 7.365 (ref 7.350–7.450)
pO2, Arterial: 80.7 mmHg — ABNORMAL LOW (ref 83.0–108.0)

## 2020-11-23 LAB — RESP PANEL BY RT-PCR (FLU A&B, COVID) ARPGX2
Influenza A by PCR: NEGATIVE
Influenza B by PCR: NEGATIVE
SARS Coronavirus 2 by RT PCR: NEGATIVE

## 2020-11-23 LAB — COMPREHENSIVE METABOLIC PANEL
ALT: 8 U/L (ref 0–44)
AST: 19 U/L (ref 15–41)
Albumin: 4.4 g/dL (ref 3.5–5.0)
Alkaline Phosphatase: 35 U/L — ABNORMAL LOW (ref 38–126)
Anion gap: 7 (ref 5–15)
BUN: 9 mg/dL (ref 8–23)
CO2: 35 mmol/L — ABNORMAL HIGH (ref 22–32)
Calcium: 9.8 mg/dL (ref 8.9–10.3)
Chloride: 92 mmol/L — ABNORMAL LOW (ref 98–111)
Creatinine, Ser: 0.91 mg/dL (ref 0.44–1.00)
GFR, Estimated: >60 mL/min (ref >60)
Glucose, Bld: 125 mg/dL — ABNORMAL HIGH (ref 70–99)
Potassium: 4 mmol/L (ref 3.5–5.1)
Sodium: 134 mmol/L — ABNORMAL LOW (ref 135–145)
Total Bilirubin: 0.4 mg/dL (ref 0.3–1.2)
Total Protein: 7.6 g/dL (ref 6.5–8.1)

## 2020-11-23 LAB — MAGNESIUM: Magnesium: 2 mg/dL (ref 1.7–2.4)

## 2020-11-23 LAB — BRAIN NATRIURETIC PEPTIDE: B Natriuretic Peptide: 414.8 pg/mL — ABNORMAL HIGH (ref 0.0–100.0)

## 2020-11-23 MED ORDER — SACUBITRIL-VALSARTAN 24-26 MG PO TABS: 1.0000 | ORAL_TABLET | Freq: Two times a day (BID) | ORAL | Status: DC

## 2020-11-23 MED ORDER — SENNOSIDES-DOCUSATE SODIUM 8.6-50 MG PO TABS: 1.0000 | ORAL_TABLET | Freq: Every evening | ORAL | Status: DC | PRN

## 2020-11-23 MED ORDER — SODIUM CHLORIDE 0.9% FLUSH
3.0000 mL | Freq: Two times a day (BID) | INTRAVENOUS | Status: DC
  Administered 2020-11-23 – 2020-12-01 (×14): 3 mL via INTRAVENOUS

## 2020-11-23 MED ORDER — SIMVASTATIN 20 MG PO TABS
20.0000 mg | ORAL_TABLET | Freq: Every day | ORAL | Status: DC
  Administered 2020-11-23 – 2020-12-06 (×14): 20 mg via ORAL
  Filled 2020-11-23 (×5): qty 1
  Filled 2020-11-23: qty 2
  Filled 2020-11-23 (×5): qty 1
  Filled 2020-11-23: qty 2
  Filled 2020-11-23 (×2): qty 1

## 2020-11-23 MED ORDER — FUROSEMIDE 10 MG/ML IJ SOLN
40.0000 mg | Freq: Once | INTRAMUSCULAR | Status: AC
  Administered 2020-11-23: 40 mg via INTRAVENOUS
  Filled 2020-11-23: qty 4

## 2020-11-23 MED ORDER — FUROSEMIDE 10 MG/ML IJ SOLN
60.0000 mg | Freq: Two times a day (BID) | INTRAMUSCULAR | Status: DC
  Administered 2020-11-23 – 2020-11-24 (×2): 60 mg via INTRAVENOUS
  Filled 2020-11-23 (×2): qty 6

## 2020-11-23 MED ORDER — ATENOLOL 25 MG PO TABS
50.0000 mg | ORAL_TABLET | Freq: Two times a day (BID) | ORAL | Status: DC
  Administered 2020-11-23 – 2020-12-08 (×29): 50 mg via ORAL
  Filled 2020-11-23: qty 2
  Filled 2020-11-23 (×9): qty 1
  Filled 2020-11-23: qty 2
  Filled 2020-11-23: qty 1
  Filled 2020-11-23: qty 2
  Filled 2020-11-23 (×2): qty 1
  Filled 2020-11-23: qty 2
  Filled 2020-11-23: qty 1
  Filled 2020-11-23: qty 2
  Filled 2020-11-23 (×2): qty 1
  Filled 2020-11-23 (×3): qty 2
  Filled 2020-11-23 (×7): qty 1

## 2020-11-23 MED ORDER — DIAZEPAM 2 MG PO TABS
2.0000 mg | ORAL_TABLET | Freq: Two times a day (BID) | ORAL | Status: DC | PRN
  Administered 2020-11-28 – 2020-12-07 (×8): 2 mg via ORAL
  Filled 2020-11-23 (×9): qty 1

## 2020-11-23 MED ORDER — ALBUTEROL SULFATE (2.5 MG/3ML) 0.083% IN NEBU
2.5000 mg | INHALATION_SOLUTION | RESPIRATORY_TRACT | Status: DC | PRN
  Administered 2020-11-29 (×2): 2.5 mg via RESPIRATORY_TRACT
  Filled 2020-11-23 (×2): qty 3

## 2020-11-23 MED ORDER — SODIUM CHLORIDE 0.9% FLUSH: 3.0000 mL | INTRAVENOUS | Status: DC | PRN

## 2020-11-23 MED ORDER — ASPIRIN EC 81 MG PO TBEC
81.0000 mg | DELAYED_RELEASE_TABLET | Freq: Every day | ORAL | Status: DC
  Administered 2020-11-23 – 2020-12-08 (×15): 81 mg via ORAL
  Filled 2020-11-23 (×16): qty 1

## 2020-11-23 MED ORDER — ALBUTEROL (5 MG/ML) CONTINUOUS INHALATION SOLN
10.0000 mg/h | INHALATION_SOLUTION | Freq: Once | RESPIRATORY_TRACT | Status: AC
  Administered 2020-11-23: 10 mg/h via RESPIRATORY_TRACT
  Filled 2020-11-23: qty 20

## 2020-11-23 MED ORDER — HYDRALAZINE HCL 50 MG PO TABS
50.0000 mg | ORAL_TABLET | Freq: Three times a day (TID) | ORAL | Status: DC
  Administered 2020-11-23 – 2020-12-07 (×40): 50 mg via ORAL
  Filled 2020-11-23 (×41): qty 1

## 2020-11-23 MED ORDER — EMPAGLIFLOZIN 10 MG PO TABS: 10.0000 mg | ORAL_TABLET | Freq: Every day | ORAL | Status: DC

## 2020-11-23 MED ORDER — ACETAMINOPHEN 325 MG PO TABS
650.0000 mg | ORAL_TABLET | Freq: Four times a day (QID) | ORAL | Status: DC | PRN
  Administered 2020-11-24 – 2020-11-29 (×8): 650 mg via ORAL
  Filled 2020-11-23 (×8): qty 2

## 2020-11-23 MED ORDER — METHYLPREDNISOLONE SODIUM SUCC 125 MG IJ SOLR
125.0000 mg | Freq: Once | INTRAMUSCULAR | Status: AC
  Administered 2020-11-23: 125 mg via INTRAVENOUS
  Filled 2020-11-23: qty 2

## 2020-11-23 MED ORDER — MELATONIN 5 MG PO TABS
5.0000 mg | ORAL_TABLET | Freq: Every evening | ORAL | Status: DC | PRN
  Administered 2020-11-23: 5 mg via ORAL
  Filled 2020-11-23: qty 1

## 2020-11-23 MED ORDER — ENOXAPARIN SODIUM 40 MG/0.4ML IJ SOSY: 40.0000 mg | PREFILLED_SYRINGE | INTRAMUSCULAR | Status: DC

## 2020-11-23 MED ORDER — SODIUM CHLORIDE 0.9 % IV SOLN: 250.0000 mL | INTRAVENOUS | Status: DC | PRN

## 2020-11-23 MED ORDER — ACETAMINOPHEN 650 MG RE SUPP: 650.0000 mg | Freq: Four times a day (QID) | RECTAL | Status: DC | PRN

## 2020-11-23 MED ORDER — IOHEXOL 350 MG/ML SOLN
75.0000 mL | Freq: Once | INTRAVENOUS | Status: AC | PRN
  Administered 2020-11-23: 75 mL via INTRAVENOUS

## 2020-11-23 NOTE — ED Notes (Signed)
To CT with patient with RN and RT.

## 2020-11-23 NOTE — ED Notes (Addendum)
Handoff report given to Engineer, maintenance at ICU [facility]

## 2020-11-23 NOTE — ED Notes (Signed)
[facility] at bedside 

## 2020-11-23 NOTE — ED Provider Notes (Signed)
[facility] EMERGENCY DEPT Provider Note   CSN: [id number] Arrival date & time: 11/23/20  1111     History Chief Complaint  Patient presents with   Shortness of Breath    Mandy [name] is a 80 y.o. female.  HPI Patient presents for shortness of breath.  She had a hospitalization for the same 3 weeks ago.  At that time, she had pulmonary edema, right pleural effusion, and acute on chronic hypoxia.  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  She underwent a thoracentesis on 11/2.  Results of fluid analysis were borderline exudative/transudative.  She was discharged on Lasix dose of 40 mg twice daily.  3 days ago, she followed up with CT surgery.  She was found to have moderate right-sided pleural effusion at that time.  She was referred for repeat thoracentesis.  Today, she presents to the ED due to worsening shortness of breath.  She is accompanied by her daughter, who does provide history.  Patient has had ongoing audible wheeze and exercise intolerance.  Her Lasix dose was decreased to 40 mg once per day, which she has been taking.  Her daughter reports good urinary output at home.  She has been on 2 L of supplemental oxygen at baseline.  Patient arrives by EMS who did give her DuoNeb treatment enroute.    Past Medical History:  Diagnosis Date   Aortic atherosclerosis ([place]) 10/08/2018   Arthritis    Diabetes mellitus without complication ([place])    GERD (gastroesophageal reflux disease)    Hepatic steatosis 10/08/2018   Hyperlipidemia    Hypertension    Hyperthyroidism    Mass of right ovary 10/08/2018   Osteoporosis    Pneumonia    SBO (small bowel obstruction) ([place]) 08/09/2018   SBO (small bowel obstruction) ([place]) 08/09/2018    Patient Active Problem List   Diagnosis Date Noted   Acute respiratory failure with hypoxia and hypercapnia ([place]) 11/23/2020   Acute on chronic heart failure with preserved ejection fraction  (HFpEF) ([place]) 11/23/2020   Hypoxia 11/03/2020   Pleural effusion on right 10/01/2020   Hyperkalemia 10/01/2020   AKI (acute kidney injury) ([place]) 10/01/2020   Microcytic anemia 10/01/2020   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   Acute respiratory failure with hypoxia ([place]) 06/26/2020   Hypertensive urgency 06/26/2020   Hypokalemia 06/26/2020   Generalized weakness 06/26/2020   Dizziness 06/26/2020   Obesity (BMI 30-39.9) 06/26/2020   Hyperthyroidism 06/26/2020   Chronic diastolic CHF (congestive heart failure) ([place]) 06/26/2020   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   Prolonged QT interval 01/31/2019   Hyponatremia 44/96/7591   Acute metabolic encephalopathy 63/84/6659   COVID-19 virus infection 01/31/2019   Mass of right ovary 10/08/2018   Hepatic steatosis 10/08/2018   Aortic atherosclerosis ([place]) 10/08/2018   SBO (small bowel obstruction) ([place]) 08/09/2018   Diabetes mellitus type 2, controlled ([place]) 04/07/2008   Dyslipidemia 04/07/2008   Essential hypertension 04/07/2008   SORE THROAT 04/07/2008   GERD 04/07/2008    Past Surgical History:  Procedure Laterality Date   CATARACT EXTRACTION W/ INTRAOCULAR LENS  IMPLANT, BILATERAL     COLONOSCOPY       OB History   No obstetric history on file.     Family History  Problem Relation Age of Onset   Kidney disease Son    Colon cancer Neg Hx    Breast cancer Neg Hx     Social History   Tobacco Use  Smoking status: Never   Smokeless tobacco: Never  Vaping Use   Vaping Use: Never used  Substance Use Topics   Alcohol use: No   Drug use: No    Home Medications Prior to Admission medications   Medication Sig Start Date End Date Taking? Authorizing Provider  acetaminophen (TYLENOL) 500 MG tablet Take 1,000 mg by mouth every 6 (six) hours as needed for fever or headache (pain).   Yes [provider]  albuterol (PROVENTIL) (2.5 MG/3ML) 0.083% nebulizer solution Take 2.5 mg by  nebulization every 6 (six) hours as needed for wheezing or shortness of breath.   Yes [provider]  albuterol (VENTOLIN HFA) 108 (90 Base) MCG/ACT inhaler Inhale 2 puffs into the lungs every 6 (six) hours as needed for wheezing or shortness of breath. 06/30/20  Yes Thurnell Lose, MD  alendronate (FOSAMAX) 70 MG tablet Take 70 mg by mouth every Tuesday. Take with a full glass of water on an empty stomach.   Yes [provider]  aspirin EC 81 MG tablet Take 81 mg by mouth daily. Swallow whole.   Yes [provider]  atenolol (TENORMIN) 50 MG tablet Take 50 mg by mouth 2 (two) times daily.   Yes [provider]  calcium carbonate (OSCAL) 1500 (600 Ca) MG TABS tablet Take 600 mg of elemental calcium by mouth 2 (two) times daily with a meal.   Yes [provider]  diazepam (VALIUM) 2 MG tablet Take 2 mg by mouth every 12 (twelve) hours as needed for anxiety.   Yes [provider]  diclofenac sodium (VOLTAREN) 1 % GEL Apply 4 g topically 4 (four) times daily as needed for pain. shoulder 09/13/18  Yes [provider]  furosemide (LASIX) 40 MG tablet Take 1 tablet (40 mg total) by mouth 2 (two) times daily. Patient taking differently: Take 40 mg by mouth every morning. 10/09/20  Yes Nicole Kindred A, DO  gabapentin (NEURONTIN) 100 MG capsule Take 100 mg by mouth at bedtime. 07/18/18  Yes [provider]  guaiFENesin (MUCINEX) 600 MG 12 hr tablet Take 600 mg by mouth 2 (two) times daily as needed for cough.   Yes [provider]  hydrALAZINE (APRESOLINE) 100 MG tablet Take 0.5 tablets (50 mg total) by mouth 3 (three) times daily. 10/09/20  Yes Nicole Kindred A, DO  melatonin 5 MG TABS Take 10 mg by mouth at bedtime.   Yes [provider]  methimazole (TAPAZOLE) 5 MG tablet Take 0.5 tablets (2.5 mg total) by mouth daily. 02/04/19  Yes Hosie Poisson, MD  Multiple Vitamin (MULTIVITAMIN WITH MINERALS) TABS tablet Take 1  tablet by mouth daily.   Yes [provider]  OXYGEN Inhale 2-4 L into the lungs continuous.   Yes [provider]  pantoprazole (PROTONIX) 40 MG tablet Take 1 tablet (40 mg total) by mouth daily at 6 (six) AM. Patient taking differently: Take 40 mg by mouth every morning. 06/30/20  Yes Thurnell Lose, MD  Pseudoeph-CPM-DM-APAP (THERAFLU FLU/COLD/COUGH PO) Take 30 mLs by mouth 2 (two) times daily as needed (cough/congestion).   Yes [provider]  simvastatin (ZOCOR) 20 MG tablet Take 1 tablet (20 mg total) by mouth at bedtime. 06/30/20  Yes Thurnell Lose, MD  vitamin B-12 (CYANOCOBALAMIN) 1000 MCG tablet Take 1,000 mcg by mouth daily.   Yes [provider]  vitamin C (ASCORBIC ACID) 500 MG tablet Take 500 mg by mouth daily.   Yes [provider]  Vitamin D, Cholecalciferol, 25 MCG (1000 UT) TABS Take 1,000 Units by mouth daily.   Yes [provider]  zinc gluconate 50 MG tablet Take 50 mg by mouth daily.   Yes [provider]  acetaminophen (TYLENOL) 325 MG tablet Take 2 tablets (650 mg total) by mouth every 6 (six) hours as needed for mild pain, fever or headache. Patient not taking: Reported on 11/23/2020 11/06/20   Cherene Altes, MD  MELATONIN ER PO Take 2 tablets by mouth at bedtime as needed (sleep). Patient not taking: Reported on 11/23/2020    [provider]  traMADol (ULTRAM) 50 MG tablet Take 1 tablet (50 mg total) by mouth every 6 (six) hours as needed (mild pain). Patient not taking: Reported on 11/23/2020 09/22/20   John Giovanni, PA-C    Allergies    Patient has no known allergies.  Review of Systems   Review of Systems  Constitutional:  Positive for activity change and fatigue. Negative for chills and fever.  HENT:  Negative for ear pain and sore throat.   Eyes:  Negative for pain and visual disturbance.  Respiratory:  Positive for chest tightness, shortness of breath and wheezing. Negative for  cough.   Cardiovascular:  Negative for chest pain, palpitations and leg swelling.  Gastrointestinal:  Negative for abdominal pain, diarrhea and vomiting.  Genitourinary:  Negative for dysuria, flank pain and hematuria.  Musculoskeletal:  Negative for arthralgias, back pain, joint swelling, myalgias and neck pain.  Skin:  Negative for color change and rash.  Neurological:  Negative for dizziness, seizures, syncope, weakness and numbness.  All other systems reviewed and are negative.  Physical Exam Updated Vital Signs BP (!) 170/63   Pulse 78   Temp 97.8 F (36.6 C) (Oral)   Resp 20   Ht 4\' 11"  (1.499 m)   Wt 76.6 kg   SpO2 100%   BMI 34.11 kg/m   Physical Exam Vitals and nursing note reviewed.  Constitutional:      General: She is in acute distress.     Appearance: She is well-developed. She is ill-appearing. She is not toxic-appearing or diaphoretic.  HENT:     Head: Normocephalic and atraumatic.     Mouth/Throat:     Mouth: Mucous membranes are moist.     Pharynx: Oropharynx is clear.  Eyes:     Extraocular Movements: Extraocular movements intact.     Conjunctiva/sclera: Conjunctivae normal.  Cardiovascular:     Rate and Rhythm: Normal rate and regular rhythm.     Heart sounds: No murmur heard. Pulmonary:     Effort: Tachypnea, accessory muscle usage and respiratory distress present.     Breath sounds: Decreased breath sounds and wheezing (Upper airway) present.  Abdominal:     Palpations: Abdomen is soft.     Tenderness: There is no abdominal tenderness.  Musculoskeletal:        General: No swelling.     Cervical back: Normal range of motion and neck supple.     Right lower leg: No edema.     Left lower leg: No edema.  Skin:    General: Skin is warm and dry.  Neurological:     General: No focal deficit present.     Mental Status: She is alert.  Psychiatric:        Mood and Affect: Mood normal.    ED Results / Procedures / Treatments   Labs (all labs  ordered are listed, but only abnormal results are  displayed) Labs Reviewed  COMPREHENSIVE METABOLIC PANEL - Abnormal; Notable for the following components:      Result Value   Sodium 134 (*)    Chloride 92 (*)    CO2 35 (*)    Glucose, Bld 125 (*)    Alkaline Phosphatase 35 (*)    All other components within normal limits  CBC WITH DIFFERENTIAL/PLATELET - Abnormal; Notable for the following components:   RBC 3.84 (*)    Hemoglobin 9.9 (*)    HCT 31.4 (*)    MCH 25.8 (*)    RDW 15.9 (*)    All other components within normal limits  BRAIN NATRIURETIC PEPTIDE - Abnormal; Notable for the following components:   B Natriuretic Peptide 414.8 (*)    All other components within normal limits  BLOOD GAS, ARTERIAL - Abnormal; Notable for the following components:   pCO2 arterial 65.5 (*)    pO2, Arterial 80.7 (*)    Bicarbonate 36.5 (*)    Acid-Base Excess 9.7 (*)    All other components within normal limits  I-STAT VENOUS BLOOD GAS, ED - Abnormal; Notable for the following components:   pCO2, Ven 85.0 (*)    Bicarbonate 42.0 (*)    TCO2 45 (*)    Acid-Base Excess 12.0 (*)    Sodium 134 (*)    HCT 34.0 (*)    Hemoglobin 11.6 (*)    All other components within normal limits  I-STAT VENOUS BLOOD GAS, ED - Abnormal; Notable for the following components:   pCO2, Ven 84.2 (*)    pO2, Ven 30.0 (*)    Bicarbonate 42.0 (*)    TCO2 44 (*)    Acid-Base Excess 12.0 (*)    Sodium 134 (*)    HCT 34.0 (*)    Hemoglobin 11.6 (*)    All other components within normal limits  RESP PANEL BY RT-PCR (FLU A&B, COVID) ARPGX2  MAGNESIUM  BASIC METABOLIC PANEL  CBC    EKG EKG Interpretation  Date/Time:  Monday November 23 2020 11:18:32 EST Ventricular Rate:  80 PR Interval:  134 QRS Duration: 80 QT Interval:  408 QTC Calculation: 471 R Axis:   63 Text Interpretation: Sinus rhythm Confirmed by Godfrey Pick ([id number]) on 11/23/2020 11:25:11 AM  Radiology CT Angio Chest PE W and/or Wo  Contrast  Result Date: 11/23/2020 CLINICAL DATA:  Difficulty breathing. EXAM: CT ANGIOGRAPHY CHEST WITH CONTRAST TECHNIQUE: Multidetector CT imaging of the chest was performed using the standard protocol during bolus administration of intravenous contrast. Multiplanar CT image reconstructions and MIPs were obtained to evaluate the vascular anatomy. CONTRAST:  15mL OMNIPAQUE IOHEXOL 350 MG/ML SOLN COMPARISON:  October 02, 2020. FINDINGS: Cardiovascular: Satisfactory opacification of the pulmonary arteries to the segmental level. No evidence of pulmonary embolism. Moderate cardiomegaly is noted. 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 No pericardial effusion. Atherosclerosis of thoracic aorta is noted without dissection. Mediastinum/Nodes: No enlarged mediastinal, hilar, or axillary lymph nodes. Thyroid gland, trachea, and esophagus demonstrate no significant findings. Lungs/Pleura: No pneumothorax is noted. Moderate right pleural effusion is noted with associated atelectasis of the right lower lobe. Mild subsegmental atelectasis is seen involving the right middle lobe and lingular segment of the left upper lobe. Upper Abdomen: No acute abnormality. Musculoskeletal: No chest wall abnormality. No acute or significant osseous findings. Review of the MIP images confirms the above findings. IMPRESSION: No definite evidence of pulmonary embolus. Moderate size right pleural effusion is noted with associated atelectasis of the  right lower lobe. Mild subsegmental atelectasis is also noted in the right middle lobe and lingular segment of left upper lobe. 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 Aortic Atherosclerosis (ICD10-I70.0). Electronically Signed   By: Marijo Conception M.D.   On: 11/23/2020 13:47   DG Chest Port 1 View  Result Date: 11/23/2020 CLINICAL DATA:  Shortness of breath, difficulty breathing EXAM: PORTABLE CHEST 1 VIEW  COMPARISON:  11/20/2020 FINDINGS: Cardiomegaly. Diffuse bilateral airspace disease, worsening since prior study. Bilateral pleural effusions, right larger than left also slightly worsening since prior study. Elevation of the right diaphragm. No acute bony abnormality. IMPRESSION: Worsening bilateral airspace disease and effusions, favor edema/CHF. Electronically Signed   By: Rolm Baptise M.D.   On: 11/23/2020 11:47    Procedures Procedures   Medications Ordered in ED Medications  aspirin EC tablet 81 mg (81 mg Oral Given 11/23/20 2113)  atenolol (TENORMIN) tablet 50 mg (50 mg Oral Given 11/23/20 2106)  hydrALAZINE (APRESOLINE) tablet 50 mg (50 mg Oral Given 11/23/20 2106)  simvastatin (ZOCOR) tablet 20 mg (20 mg Oral Given 11/23/20 2106)  diazepam (VALIUM) tablet 2 mg (has no administration in time range)  melatonin tablet 5 mg (5 mg Oral Given 11/23/20 2106)  albuterol (PROVENTIL) (2.5 MG/3ML) 0.083% nebulizer solution 2.5 mg (has no administration in time range)  furosemide (LASIX) injection 60 mg (60 mg Intravenous Given 11/23/20 2106)  sodium chloride flush (NS) 0.9 % injection 3 mL (3 mLs Intravenous Given 11/23/20 2115)  sodium chloride flush (NS) 0.9 % injection 3 mL (has no administration in time range)  0.9 %  sodium chloride infusion (has no administration in time range)  acetaminophen (TYLENOL) tablet 650 mg (has no administration in time range)    Or  acetaminophen (TYLENOL) suppository 650 mg (has no administration in time range)  senna-docusate (Senokot-S) tablet 1 tablet (has no administration in time range)  albuterol (PROVENTIL,VENTOLIN) solution continuous neb (10 mg/hr Nebulization Given 11/23/20 1214)  methylPREDNISolone sodium succinate (SOLU-MEDROL) 125 mg/2 mL injection 125 mg (125 mg Intravenous Given 11/23/20 1146)  furosemide (LASIX) injection 40 mg (40 mg Intravenous Given 11/23/20 1344)  iohexol (OMNIPAQUE) 350 MG/ML injection 75 mL (75 mLs Intravenous Contrast  Given 11/23/20 1327)    ED Course  I have reviewed the triage vital signs and the nursing notes.  Pertinent labs & imaging results that were available during my care of the patient were reviewed by me and considered in my medical decision making (see chart for details).    MDM Rules/Calculators/A&P                         CRITICAL CARE Performed by: Godfrey Pick   Total critical care time: 45 minutes  Critical care time was exclusive of separately billable procedures and treating other patients.    Critical care was necessary to treat or prevent imminent or life-threatening deterioration.  Critical care was time spent personally by me on the following activities: development of treatment plan with patient and/or surrogate as well as nursing, discussions with consultants, evaluation of patient's response to treatment, examination of patient, obtaining history from patient or surrogate, ordering and performing treatments and interventions, ordering and review of laboratory studies, ordering and review of radiographic studies, pulse oximetry and re-evaluation of patient's condition.   Patient is a 80 year old female with history of pulmonary edema, pleural effusions, on 2 L of supplemental oxygen at baseline, presenting for respiratory distress.  On  arrival, patient has increased work of breathing, tachypnea, audible expiratory wheeze which does appear to radiate from her upper airway, shallow respirations, and diminished breath sounds throughout her lung fields.  She did report some improvement with DuoNeb, given by EMS.  BiPAP was initiated.  Inline albuterol and IV Solu-Medrol was given.  Chest x-ray showed worsening pulmonary edema and pleural effusions.  40 mg IV Lasix given following lab work which showed normal electrolytes.  Blood gas showed respiratory acidosis.  BNP was elevated at 414.  Patient was able to tolerate BiPAP well.  She did appear more comfortable with decreased work of  breathing on BiPAP.  Repeat blood gas showed no improvement in her respiratory acidosis.  Patient was able to undergo CTA imaging which showed redemonstration of pleural effusion and areas of atelectasis.  Patient remained on BiPAP.  She is admitted to hospitalist for further management.  Final Clinical Impression(s) / ED Diagnoses Final diagnoses:  Acute respiratory failure with hypoxia and hypercapnia ([place])  Respiratory distress    Rx / DC Orders ED Discharge Orders     None        Godfrey Pick, MD 11/24/20 [phone]

## 2020-11-23 NOTE — Progress Notes (Signed)
Pharmacy Brief Note:   Per protocol, will discontinue empagliflozin as patient is currently NPO.  Lenis Noon, PharmD 11/23/20 8:49 PM

## 2020-11-23 NOTE — ED Triage Notes (Signed)
Arrive from home with difficulty breathing.  Diminish breath sounds and wheezing.

## 2020-11-23 NOTE — H&P (Signed)
History and Physical    Mandy [name] TSV:[id number] DOB: 1940/08/09 DOA: 11/23/2020  PCP: [name], MD   Patient coming from: Home  Chief Complaint: SOB  HPI: Mandy [name] is a 80 y.o. 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  Patient is from [place] and does not speak English but her daughter is here and translates for her.  Patient does not want to use any of the translation service. She had recent hospitalization for pleural effusion and SOB.  Had thoracentesis at that time.  Reports that she has been having increasing shortness of breath over the last few days.  They saw cardiothoracic surgery, Dr. Kipp Brood, last Friday and chest x-ray at that time revealed a enlarging right-sided pleural effusion and Dr. Kipp Brood referred her to pulmonology, Dr. Valeta Harms for thoracentesis.  Pulmonology office called the daughter this morning and made appointment for next week with Dr. Valeta Harms.  Patient is having increasing shortness of breath and is having difficulty walking a few steps due to the shortness of breath and had decreased oxygen saturation at home.  She normally uses 2 L of oxygen nasal cannula at home but daughter had increased to 4 L this morning in order to keep O2 sats greater than 90%.  With worsening shortness of breath family cough as she awaits her next week and brought to the emergency room.  She has not been having any increased swelling.  She has had increased difficulty sleeping last 2 nights due to orthopnea and had to sleep more inclined.  Daughter did give her an extra Lasix yesterday due to her increased shortness of breath.  She has not had any recent travel or change in diet.  Denies any fever, chills, cough, nausea vomiting diarrhea, chest pain, urinary symptoms, abdominal pain. Lives with family.  Denies tobacco alcohol or illicit  drug use.  ED Course: In the emergency room she was found to have acute on chronic respiratory failure with hypercapnia and hypoxia.  She was placed on BiPAP in the emergency room.  She is now transferred to [facility] and is currently not on the BiPAP and is alert.  We will recheck ABG and if still severely hypercapnic will resume BiPAP overnight and then wean as tolerated.  BNP elevated for 414.8.  We will check serial troponin levels as these were not done in the emergency room.  CBC is unremarkable.  Sodium 134 potassium 4.0 chloride 92 bicarb 35 creatinine 0.91 BUN 9 glucose 125 magnesium 2.0 LFTs normal.  Hospitalist service been asked admit for further management  Review of Systems:  General: Denies fever, chills, weight loss, night sweats.  Denies dizziness.  Denies change in appetite HENT: Denies head trauma, headache, denies change in hearing, tinnitus.  Denies nasal congestion or bleeding.  Denies sore throat.  Denies difficulty swallowing Eyes: Denies blurry vision, pain in eye, drainage.  Denies discoloration of eyes. Neck: Denies pain.  Denies swelling.  Denies pain with movement. Cardiovascular: Denies chest pain, palpitations.  Denies edema.  Reports orthopnea Respiratory: Reports shortness of breath. Denies cough.  Denies wheezing.  Denies sputum production Gastrointestinal: Denies abdominal pain, swelling.  Denies nausea, vomiting, diarrhea.  Denies melena.  Denies hematemesis. Musculoskeletal: Denies limitation of movement. Denies deformity or swelling. Denies arthralgias or myalgias. Genitourinary: Denies pelvic pain.  Denies urinary frequency or hesitancy.  Denies dysuria.  Skin: Denies rash.  Denies petechiae, purpura, ecchymosis. Neurological:  Denies syncope.  Denies seizure activity.  Denies paresthesia. Denies slurred speech, drooping face.  Denies visual change. Psychiatric: Denies depression, anxiety. Denies hallucinations.  Past Medical History:  Diagnosis  Date   Aortic atherosclerosis ([facility]) 10/08/2018   Arthritis    Diabetes mellitus without complication (HCC)    GERD (gastroesophageal reflux disease)    Hepatic steatosis 10/08/2018   Hyperlipidemia    Hypertension    Hyperthyroidism    Mass of right ovary 10/08/2018   Osteoporosis    Pneumonia    SBO (small bowel obstruction) ([place]) 08/09/2018   SBO (small bowel obstruction) ([place]) 08/09/2018    Past Surgical History:  Procedure Laterality Date   CATARACT EXTRACTION W/ INTRAOCULAR LENS  IMPLANT, BILATERAL     COLONOSCOPY      Social History  reports that she has never smoked. She has never used smokeless tobacco. She reports that she does not drink alcohol and does not use drugs.  No Known Allergies  Family History  Problem Relation Age of Onset   Kidney disease Son    Colon cancer Neg Hx    Breast cancer Neg Hx      Prior to Admission medications   Medication Sig Start Date End Date Taking? Authorizing Provider  acetaminophen (TYLENOL) 325 MG tablet Take 2 tablets (650 mg total) by mouth every 6 (six) hours as needed for mild pain, fever or headache. 11/06/20   Cherene Altes, MD  albuterol (VENTOLIN HFA) 108 (90 Base) MCG/ACT inhaler Inhale 2 puffs into the lungs every 6 (six) hours as needed for wheezing or shortness of breath. 06/30/20   Thurnell Lose, MD  alendronate (FOSAMAX) 70 MG tablet Take 70 mg by mouth every Tuesday. Take with a full glass of water on an empty stomach.    [provider]  aspirin EC 81 MG tablet Take 81 mg by mouth daily. Swallow whole.    [provider]  atenolol (TENORMIN) 50 MG tablet Take 50 mg by mouth 2 (two) times daily.    [provider]  calcium carbonate (OSCAL) 1500 (600 Ca) MG TABS tablet Take 600 mg of elemental calcium by mouth 2 (two) times daily with a meal.    [provider]  diazepam (VALIUM) 2 MG tablet Take 2 mg by mouth every 12 (twelve) hours as needed for anxiety.    [provider]  diclofenac sodium (VOLTAREN) 1 % GEL Apply 4 g topically 4 (four) times daily as needed for pain. shoulder 09/13/18   [provider]  furosemide (LASIX) 40 MG tablet Take 1 tablet (40 mg total) by mouth 2 (two) times daily. 10/09/20   Nicole Kindred A, DO  gabapentin (NEURONTIN) 100 MG capsule Take 100 mg by mouth at bedtime. 07/18/18   [provider]  guaiFENesin (MUCINEX) 600 MG 12 hr tablet Take 600 mg by mouth 2 (two) times daily as needed for cough.    [provider]  hydrALAZINE (APRESOLINE) 100 MG tablet Take 0.5 tablets (50 mg total) by mouth 3 (three) times daily. 10/09/20   Ezekiel Slocumb, DO  MELATONIN ER PO Take 2 tablets by mouth at bedtime as needed (sleep).    [provider]  metFORMIN (GLUCOPHAGE) 500 MG tablet Take 500 mg by mouth 2 (two) times daily with a meal.  Patient not taking: Reported on 11/20/2020    [provider]  methimazole (TAPAZOLE) 5 MG tablet Take 0.5 tablets (2.5 mg  total) by mouth daily. 02/04/19   Hosie Poisson, MD  Multiple Vitamin (MULTIVITAMIN WITH MINERALS) TABS tablet Take 1 tablet by mouth daily.    [provider]  pantoprazole (PROTONIX) 40 MG tablet Take 1 tablet (40 mg total) by mouth daily at 6 (six) AM. Patient taking differently: Take 40 mg by mouth daily. 06/30/20   Thurnell Lose, MD  simvastatin (ZOCOR) 20 MG tablet Take 1 tablet (20 mg total) by mouth at bedtime. 06/30/20   Thurnell Lose, MD  traMADol (ULTRAM) 50 MG tablet Take 1 tablet (50 mg total) by mouth every 6 (six) hours as needed (mild pain). Patient taking differently: Take 50 mg by mouth every 6 (six) hours as needed for moderate pain (mild pain). 09/22/20   Gold, Wilder Glade, PA-C  vitamin B-12 (CYANOCOBALAMIN) 1000 MCG tablet Take 1,000 mcg by mouth daily.    [provider]  vitamin C (ASCORBIC ACID) 500 MG tablet Take 500 mg by mouth daily.    [provider]  Vitamin D, Cholecalciferol, 25  MCG (1000 UT) TABS Take 1,000 Units by mouth daily.    [provider]  zinc gluconate 50 MG tablet Take 50 mg by mouth daily.    [provider]    Physical Exam: Vitals:   11/23/20 1340 11/23/20 1345 11/23/20 1400 11/23/20 1502  BP: 140/76  (!) 154/66 (!) 128/56  Pulse: 88 87 76 68  Resp: (!) 23 (!) 22 16 18   Temp:      TempSrc:      SpO2: 95% 91% 91% 98%  Weight:      Height:        Constitutional: NAD, calm, comfortable Vitals:   11/23/20 1340 11/23/20 1345 11/23/20 1400 11/23/20 1502  BP: 140/76  (!) 154/66 (!) 128/56  Pulse: 88 87 76 68  Resp: (!) 23 (!) 22 16 18   Temp:      TempSrc:      SpO2: 95% 91% 91% 98%  Weight:      Height:       General: WDWN, Alert and oriented x3.  Eyes: EOMI, PERRL, conjunctivae normal.  Sclera nonicteric HENT:  [place]/AT, external ears normal. Nares patent without epistasis. Mucous membranes are moist. Posterior pharynx clear Neck: Soft, normal range of motion, supple, no masses, Trachea midline Respiratory: Diminished breath sounds in RLL. Has mild diffuse expiratory wheezing, no crackles or rhonchi. Normal respiratory effort. No accessory muscle use.  Cardiovascular: Regular rate and rhythm, no murmurs / rubs / gallops. No extremity edema. 2+ pedal pulses. No JVD. Abdomen: Soft, no tenderness, nondistended, no rebound or guarding.  No masses palpated. Obese. Bowel sounds normoactive Musculoskeletal: FROM. no cyanosis. No joint deformity upper and lower extremities. Normal muscle tone.  Skin: Warm, dry, intact no rashes, lesions, ulcers. No induration Neurologic: CN 2-12 grossly intact.  Normal speech.  Sensation intact to touch. Strength 5/5 in all extremities.   Psychiatric: Normal mood.   Labs on Admission: I have personally reviewed following labs and imaging studies  CBC: Recent Labs  Lab 11/23/20 1145 11/23/20 1150 11/23/20 1317  WBC 5.5  --   --   NEUTROABS 3.7  --   --   HGB 9.9* 11.6* 11.6*  HCT 31.4*  34.0* 34.0*  MCV 81.8  --   --   PLT 318  --   --     Basic Metabolic Panel: Recent Labs  Lab 11/23/20 1145 11/23/20 1150 11/23/20 1317  NA 134* 134* 134*  K 4.0 4.1 3.9  CL 92*  --   --   CO2 35*  --   --   GLUCOSE 125*  --   --   BUN 9  --   --   CREATININE 0.91  --   --   CALCIUM 9.8  --   --   MG 2.0  --   --     GFR: Estimated Creatinine Clearance: 44.8 mL/min (by C-G formula based on SCr of 0.91 mg/dL).  Liver Function Tests: Recent Labs  Lab 11/23/20 1145  AST 19  ALT 8  ALKPHOS 35*  BILITOT 0.4  PROT 7.6  ALBUMIN 4.4    Urine analysis:    Component Value Date/Time   COLORURINE YELLOW 09/24/2020 1536   APPEARANCEUR HAZY (A) 09/24/2020 1536   LABSPEC 1.016 09/24/2020 1536   PHURINE 5.0 09/24/2020 1536   GLUCOSEU NEGATIVE 09/24/2020 1536   HGBUR NEGATIVE 09/24/2020 [place] 09/24/2020 1536   [place] 09/24/2020 1536   PROTEINUR NEGATIVE 09/24/2020 1536   NITRITE NEGATIVE 09/24/2020 1536   LEUKOCYTESUR TRACE (A) 09/24/2020 1536    Radiological Exams on Admission: CT Angio Chest PE W and/or Wo Contrast  Result Date: 11/23/2020 CLINICAL DATA:  Difficulty breathing. EXAM: CT ANGIOGRAPHY CHEST WITH CONTRAST TECHNIQUE: Multidetector CT imaging of the chest was performed using the standard protocol during bolus administration of intravenous contrast. Multiplanar CT image reconstructions and MIPs were obtained to evaluate the vascular anatomy. CONTRAST:  47mL OMNIPAQUE IOHEXOL 350 MG/ML SOLN COMPARISON:  October 02, 2020. FINDINGS: Cardiovascular: Satisfactory opacification of the pulmonary arteries to the segmental level. No evidence of pulmonary embolism. Moderate cardiomegaly is noted. 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 No pericardial effusion. Atherosclerosis of thoracic aorta is noted without dissection. Mediastinum/Nodes: No enlarged mediastinal, hilar, or axillary lymph nodes.  Thyroid gland, trachea, and esophagus demonstrate no significant findings. Lungs/Pleura: No pneumothorax is noted. Moderate right pleural effusion is noted with associated atelectasis of the right lower lobe. Mild subsegmental atelectasis is seen involving the right middle lobe and lingular segment of the left upper lobe. Upper Abdomen: No acute abnormality. Musculoskeletal: No chest wall abnormality. No acute or significant osseous findings. Review of the MIP images confirms the above findings. IMPRESSION: No definite evidence of pulmonary embolus. Moderate size right pleural effusion is noted with associated atelectasis of the right lower lobe. Mild subsegmental atelectasis is also noted in the right middle lobe and lingular segment of left upper lobe. 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 Aortic Atherosclerosis (ICD10-I70.0). Electronically Signed   By: Marijo Conception M.D.   On: 11/23/2020 13:47   DG Chest Port 1 View  Result Date: 11/23/2020 CLINICAL DATA:  Shortness of breath, difficulty breathing EXAM: PORTABLE CHEST 1 VIEW COMPARISON:  11/20/2020 FINDINGS: Cardiomegaly. Diffuse bilateral airspace disease, worsening since prior study. Bilateral pleural effusions, right larger than left also slightly worsening since prior study. Elevation of the right diaphragm. No acute bony abnormality. IMPRESSION: Worsening bilateral airspace disease and effusions, favor edema/CHF. Electronically Signed   By: Rolm Baptise M.D.   On: 11/23/2020 11:47    EKG: Independently reviewed.  EKG shows normal sinus rhythm with no acute ST elevation or depression.  QTc borderline at 471  Assessment/Plan Principal Problem:   Acute respiratory failure with hypoxia and hypercapnia Ms. Tsuchiya is admitted to stepdown unit.  Continue Bipap. Recheck ABG to monitor hypercapnia and hypoxia. RT to follow.  Will wean  Bipap as tolerated.  Albuterol MDI with aerochamber as needed.    Active Problems:   Acute on chronic heart failure with preserved ejection fraction (HFpEF) Continue atenolol.  Add Entresto to patient's regimen. Lasix 60 mg IV twice daily for the 3 doses.  Monitor I&O monitor daily weight Obtain echocardiogram in the morning    Pleural effusion on right Consult IR in the morning for thoracentesis Patient was seen by cardiothoracic surgery last Friday who recommended thoracentesis and was referring to pulmonology but that appointment cannot be obtained until next week and patient could not wait any longer.    Essential hypertension Continue home medication of atenolol and hydralazine.  Monitor blood pressure.    Diabetes mellitus type 2, controlled  Patient is well controlled diabetes mellitus.  Metformin is held for 48 hours after receiving IV contrast for CTA of chest.  Recheck glucose level in morning with labs. Add Jardiance which will help with diabetes as well as CHF.  Patient will need to make sure she stays hydrated and does not dehydrate with therapy and follow-up with UA if UTI symptoms develop    Hyponatremia Chronic and mild.  Continue to monitor with labs in morning  DVT prophylaxis: SCD for DVT prophylaxis. Anticoagulation held in anticipation of thoracentesis Code Status:   Full Code  Family Communication:  Diagnosis and plan discussed with patient and her family is at the bedside.  They verbalized understanding and agree with plan.  Further recommendations to follow as clinical indicated Disposition Plan:   Patient is from:  Home  Anticipated DC to:  Home  Anticipated DC date:  Anticipate 2 midnight or more stay in the hospital  Admission status:  Inpatient  Yevonne Aline Damara Klunder MD [facility]  How to contact the [facility] Attending or Consulting provider [place] or covering provider during after hours [place], for this patient?   Check the care team in [facility] and look for a) attending/consulting [facility] provider listed and b) the [facility]  team listed Log into [email or web address] and use [place]'s universal password to access. If you do not have the password, please contact the hospital operator. Locate the [facility] provider you are looking for under [facility] and page to a number that you can be directly reached. If you still have difficulty reaching the provider, please page the [facility] (Director on Call) for the Hospitalists listed on amion for assistance.  11/23/2020, 7:46 PM

## 2020-11-23 NOTE — Progress Notes (Signed)
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   Hosie Poisson, MD

## 2020-11-24 ENCOUNTER — Inpatient Hospital Stay (HOSPITAL_COMMUNITY): Payer: Medicare HMO

## 2020-11-24 DIAGNOSIS — I5033 Acute on chronic diastolic (congestive) heart failure: Secondary | ICD-10-CM | POA: Diagnosis not present

## 2020-11-24 DIAGNOSIS — F419 Anxiety disorder, unspecified: Secondary | ICD-10-CM

## 2020-11-24 DIAGNOSIS — J9611 Chronic respiratory failure with hypoxia: Secondary | ICD-10-CM

## 2020-11-24 DIAGNOSIS — J9602 Acute respiratory failure with hypercapnia: Secondary | ICD-10-CM | POA: Diagnosis not present

## 2020-11-24 DIAGNOSIS — J9601 Acute respiratory failure with hypoxia: Secondary | ICD-10-CM | POA: Diagnosis not present

## 2020-11-24 DIAGNOSIS — Z9981 Dependence on supplemental oxygen: Secondary | ICD-10-CM

## 2020-11-24 LAB — CBC WITH DIFFERENTIAL/PLATELET
Abs Immature Granulocytes: 0.01 10*3/uL (ref 0.00–0.07)
Basophils Absolute: 0 10*3/uL (ref 0.0–0.1)
Basophils Relative: 0 %
Eosinophils Absolute: 0 10*3/uL (ref 0.0–0.5)
Eosinophils Relative: 0 %
HCT: 28.1 % — ABNORMAL LOW (ref 36.0–46.0)
Hemoglobin: 8.8 g/dL — ABNORMAL LOW (ref 12.0–15.0)
Immature Granulocytes: 0 %
Lymphocytes Relative: 19 %
Lymphs Abs: 0.8 10*3/uL (ref 0.7–4.0)
MCH: 26.3 pg (ref 26.0–34.0)
MCHC: 31.3 g/dL (ref 30.0–36.0)
MCV: 83.9 fL (ref 80.0–100.0)
Monocytes Absolute: 0.4 10*3/uL (ref 0.1–1.0)
Monocytes Relative: 9 %
Neutro Abs: 2.9 10*3/uL (ref 1.7–7.7)
Neutrophils Relative %: 72 %
Platelets: 288 10*3/uL (ref 150–400)
RBC: 3.35 MIL/uL — ABNORMAL LOW (ref 3.87–5.11)
RDW: 16 % — ABNORMAL HIGH (ref 11.5–15.5)
WBC: 4 10*3/uL (ref 4.0–10.5)
nRBC: 0 % (ref 0.0–0.2)

## 2020-11-24 LAB — ECHOCARDIOGRAM LIMITED
Area-P 1/2: 3.17 cm2
Calc EF: 60.8 %
Height: 59 in
MV M vel: 5.26 m/s
MV Peak grad: 110.7 mmHg
Radius: 0.4 cm
S' Lateral: 2.8 cm
Single Plane A2C EF: 56.9 %
Single Plane A4C EF: 61.2 %
Weight: 2702.4 oz

## 2020-11-24 LAB — COMPREHENSIVE METABOLIC PANEL
ALT: 10 U/L (ref 0–44)
AST: 15 U/L (ref 15–41)
Albumin: 3.5 g/dL (ref 3.5–5.0)
Alkaline Phosphatase: 29 U/L — ABNORMAL LOW (ref 38–126)
Anion gap: 8 (ref 5–15)
BUN: 24 mg/dL — ABNORMAL HIGH (ref 8–23)
CO2: 35 mmol/L — ABNORMAL HIGH (ref 22–32)
Calcium: 8.9 mg/dL (ref 8.9–10.3)
Chloride: 94 mmol/L — ABNORMAL LOW (ref 98–111)
Creatinine, Ser: 1.39 mg/dL — ABNORMAL HIGH (ref 0.44–1.00)
GFR, Estimated: 39 mL/min — ABNORMAL LOW (ref >60)
Glucose, Bld: 117 mg/dL — ABNORMAL HIGH (ref 70–99)
Potassium: 4.2 mmol/L (ref 3.5–5.1)
Sodium: 137 mmol/L (ref 135–145)
Total Bilirubin: 0.4 mg/dL (ref 0.3–1.2)
Total Protein: 6.6 g/dL (ref 6.5–8.1)

## 2020-11-24 LAB — MRSA NEXT GEN BY PCR, NASAL: MRSA by PCR Next Gen: NOT DETECTED

## 2020-11-24 LAB — LACTATE DEHYDROGENASE: LDH: 109 U/L (ref 98–192)

## 2020-11-24 MED ORDER — PANTOPRAZOLE SODIUM 40 MG PO TBEC
40.0000 mg | DELAYED_RELEASE_TABLET | Freq: Every day | ORAL | Status: DC
  Administered 2020-11-24 – 2020-12-08 (×13): 40 mg via ORAL
  Filled 2020-11-24 (×13): qty 1

## 2020-11-24 MED ORDER — FUROSEMIDE 10 MG/ML IJ SOLN: 40.0000 mg | Freq: Every day | INTRAMUSCULAR | Status: DC

## 2020-11-24 MED ORDER — MELATONIN 5 MG PO TABS
10.0000 mg | ORAL_TABLET | Freq: Every day | ORAL | Status: DC
  Administered 2020-11-24 – 2020-12-06 (×13): 10 mg via ORAL
  Filled 2020-11-24 (×13): qty 2

## 2020-11-24 MED ORDER — CHLORHEXIDINE GLUCONATE CLOTH 2 % EX PADS
6.0000 | MEDICATED_PAD | Freq: Every day | CUTANEOUS | Status: DC
  Administered 2020-11-24 – 2020-11-25 (×2): 6 via TOPICAL

## 2020-11-24 MED ORDER — GABAPENTIN 100 MG PO CAPS
100.0000 mg | ORAL_CAPSULE | Freq: Every day | ORAL | Status: DC
  Administered 2020-11-24 – 2020-12-06 (×13): 100 mg via ORAL
  Filled 2020-11-24 (×14): qty 1

## 2020-11-24 MED ORDER — METHIMAZOLE 5 MG PO TABS
2.5000 mg | ORAL_TABLET | Freq: Every day | ORAL | Status: DC
  Administered 2020-11-24 – 2020-12-08 (×15): 2.5 mg via ORAL
  Filled 2020-11-24 (×15): qty 1

## 2020-11-24 MED ORDER — CHLORHEXIDINE GLUCONATE CLOTH 2 % EX PADS: 6.0000 | MEDICATED_PAD | Freq: Every day | CUTANEOUS | Status: DC

## 2020-11-24 MED ORDER — ORAL CARE MOUTH RINSE
15.0000 mL | Freq: Two times a day (BID) | OROMUCOSAL | Status: DC
  Administered 2020-11-24 – 2020-12-08 (×20): 15 mL via OROMUCOSAL

## 2020-11-24 MED ORDER — LIDOCAINE HCL 1 % IJ SOLN
INTRAMUSCULAR | Status: AC
  Filled 2020-11-24: qty 20

## 2020-11-24 NOTE — Assessment & Plan Note (Addendum)
Compensated resp acidosis CXR with bilateral airspace disease and effusions - edema/HF? CT with moderate R effusion, pulm artery HTN 2/2 effusion and HF exacerbation Continue diuresis, plan for thora, will request eval by pulmonary given recurrent effusion

## 2020-11-24 NOTE — Assessment & Plan Note (Signed)
S/p robotic assisted Right video thoracoscopy and resection of anterior mediastinal mass 09/17/2020 Has had recurrent effusions it seems since then - required thoracentesis 11/2 Planning for this again during this admission Exudative effusion 11/2 Will discuss recurrent effusions with pulm

## 2020-11-24 NOTE — Assessment & Plan Note (Signed)
methimazole

## 2020-11-24 NOTE — Assessment & Plan Note (Addendum)
Echo 11/22/2020 with grade I diastolic dysfunction No need for repeat echo during this admission Transition to daily lasix with bump in creatinine Not overtly overloaded on exam, BNP is elevated

## 2020-11-24 NOTE — Assessment & Plan Note (Signed)
Atenolol, hydralazine

## 2020-11-24 NOTE — Assessment & Plan Note (Signed)
2/2 diuresis Reduce to daily dosing and follow

## 2020-11-24 NOTE — Assessment & Plan Note (Signed)
On 2 L chronically, unclear underlying cause - looks like from HF?

## 2020-11-24 NOTE — Progress Notes (Signed)
  Echocardiogram 2D Echocardiogram has been performed.  Bobbye Charleston 11/24/2020, 3:48 PM

## 2020-11-24 NOTE — Assessment & Plan Note (Signed)
Downtrending, follow

## 2020-11-24 NOTE — Assessment & Plan Note (Signed)
improved

## 2020-11-24 NOTE — Assessment & Plan Note (Addendum)
Appears diet controlled, I don't see any meds on home list

## 2020-11-24 NOTE — Assessment & Plan Note (Signed)
valium 

## 2020-11-24 NOTE — Progress Notes (Addendum)
PROGRESS NOTE    Mandy [name]  NTI:[id number] DOB: 31-Jan-1940 DOA: 11/23/2020 PCP: [name], MD   Chief Complaint  Patient presents with   Shortness of Breath   Brief Narrative:  Mandy [name] is Mandy [name] 81 y.o. 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  Patient is from [place] and does not speak English but her daughter is here and translates for her.  Patient does not want to use any of the translation service. She had recent hospitalization for pleural effusion and SOB.  Had thoracentesis at that time.  Reports that she has been having increasing shortness of breath over the last few days.  They saw cardiothoracic surgery, Dr. Kipp Brood, last Friday and chest x-ray at that time revealed Mandy [name] enlarging right-sided pleural effusion and Dr. Kipp Brood referred her to pulmonology, Dr. Valeta Harms for thoracentesis.  Pulmonology office called the daughter this morning and made appointment for next week with Dr. Valeta Harms.  Patient is having increasing shortness of breath and is having difficulty walking Mandy [name] few steps due to the shortness of breath and had decreased oxygen saturation at home.  She normally uses 2 L of oxygen nasal cannula at home but daughter had increased to 4 L this morning in order to keep O2 sats greater than 90%.  With worsening shortness of breath family cough as she awaits her next week and brought to the emergency room.  She has not been having any increased swelling.  She has had increased difficulty sleeping last 2 nights due to orthopnea and had to sleep more inclined.  Daughter did give her an extra Lasix yesterday due to her increased shortness of breath.  She has not had any recent travel or change in diet.  Denies any fever, chills, cough, nausea vomiting diarrhea, chest pain, urinary symptoms, abdominal pain. Lives with family.  Denies tobacco  alcohol or illicit drug use.   ED Course: In the emergency room she was found to have acute on chronic respiratory failure with hypercapnia and hypoxia.  She was placed on BiPAP in the emergency room.  She is now transferred to [facility] and is currently not on the BiPAP and is alert.  We will recheck ABG and if still severely hypercapnic will resume BiPAP overnight and then wean as tolerated.  BNP elevated for 414.8.  We will check serial troponin levels as these were not done in the emergency room.  CBC is unremarkable.  Sodium 134 potassium 4.0 chloride 92 bicarb 35 creatinine 0.91 BUN 9 glucose 125 magnesium 2.0 LFTs normal.  Hospitalist service been asked admit for further management  Assessment & Plan:   Principal Problem:   Acute respiratory failure with hypoxia and hypercapnia (HCC) Active Problems:   Acute on chronic heart failure with preserved ejection fraction (HFpEF) (HCC)   Pleural effusion on right   Chronic respiratory failure with hypoxia, on home oxygen therapy ([place])   Essential hypertension   Diabetes mellitus type 2, controlled (HCC)   Hyponatremia   Dyslipidemia   Hyperthyroidism   Anxiety  * Acute respiratory failure with hypoxia and hypercapnia (HCC) Compensated resp acidosis CXR with bilateral airspace disease and effusions - edema/HF? 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) On 2 L chronically, unclear underlying cause - looks like from HF?  Essential hypertension Atenolol, hydralazine  Anemia Downtrending, follow  Hyponatremia improved  Diabetes mellitus type 2, controlled ([place]) Appears diet controlled, I don't see any meds on home list  Anxiety valium  Hyperthyroidism methimazole  Dyslipidemia statin  DVT prophylaxis: SCD Code Status: full Family Communication: noen at bedside Disposition:   Status is: Inpatient  Remains inpatient appropriate because: need for IR, pulm eval       Consultants:  IR PCCM  Procedures:  none  Antimicrobials:  Anti-infectives (From admission, onward)    None          Subjective: No [place] interpreter available Called daughter on phone during our conversation She's feeling better today  Objective: Vitals:   11/24/20 0652 11/24/20 0700 11/24/20 0800 11/24/20 0900  BP:  (!) 170/63 (!) 165/70 (!) 164/53  Pulse:  78 73 77  Resp:  20 (!) 21 20  Temp: 97.8 F (36.6 C)     TempSrc: Oral     SpO2:  100% 100% 100%  Weight:      Height:        Intake/Output Summary (Last 24 hours) at 11/24/2020 0943 Last data filed at 11/24/2020 0900 Gross per 24 hour  Intake 50 ml  Output 1000 ml  Net -950 ml   Filed Weights   11/23/20 1128  Weight: 76.6 kg    Examination:  General: No acute distress. Cardiovascular: RRR Lungs: unlabored, diminished Abdomen: Soft, nontender, nondistended  Neurological: Alert and oriented 3. Moves all extremities 4 h. Cranial nerves II through XII grossly intact. Skin: Warm and dry. No rashes or lesions. Extremities: No clubbing or cyanosis. No edema.    Data Reviewed: I have personally reviewed following labs and imaging  studies  CBC: Recent Labs  Lab 11/23/20 1145 11/23/20 1150 11/23/20 1317 11/24/20 0811  WBC 5.5  --   --  4.0  NEUTROABS 3.7  --   --  2.9  HGB 9.9* 11.6* 11.6* 8.8*  HCT 31.4* 34.0* 34.0* 28.1*  MCV 81.8  --   --  83.9  PLT 318  --   --  102    Basic Metabolic Panel: Recent Labs  Lab 11/23/20 1145 11/23/20 1150 11/23/20 1317 11/24/20 0811  NA 134* 134* 134* 137  K 4.0 4.1 3.9 4.2  CL 92*  --   --  94*  CO2 35*  --   --  35*  GLUCOSE 125*  --   --  117*  BUN 9  --   --  24*  CREATININE 0.91  --   --  1.39*  CALCIUM 9.8  --   --  8.9  MG 2.0  --   --   --     GFR: Estimated Creatinine Clearance: 29.3 mL/min (Riad Wagley) (by C-G formula based on SCr of 1.39 mg/dL (H)).  Liver Function Tests: Recent Labs  Lab 11/23/20 1145  11/24/20 0811  AST 19 15  ALT 8 10  ALKPHOS 35* 29*  BILITOT 0.4 0.4  PROT 7.6 6.6  ALBUMIN 4.4 3.5    CBG: No results for input(s): GLUCAP in the last 168 hours.   Recent Results (from the past 240 hour(s))  Resp Panel by RT-PCR (Flu Duglas Heier&B, Covid) Nasopharyngeal Swab     Status: None   Collection Time: 11/23/20 11:31 AM   Specimen: Nasopharyngeal Swab; Nasopharyngeal(NP) swabs in vial transport medium  Result Value Ref Range Status   SARS Coronavirus 2 by RT PCR NEGATIVE NEGATIVE Final    Comment: (NOTE) SARS-CoV-2 target nucleic acids are NOT DETECTED.  The SARS-CoV-2 RNA is generally detectable in upper respiratory specimens during the acute phase of infection. The lowest concentration of SARS-CoV-2 viral copies this assay can detect is 138 copies/mL. Domonick Sittner negative result does not preclude SARS-Cov-2 infection and should not be used as the sole basis for treatment or other patient management decisions. Marilea Gwynne negative result may occur with  improper specimen collection/handling, submission of specimen other than nasopharyngeal swab, presence of viral mutation(s) within the areas targeted by this assay, and inadequate number of viral copies(<138  copies/mL). Reneka Nebergall negative result must be combined with clinical observations, patient history, and epidemiological information. The expected result is Negative.  Fact Sheet for Patients:  EntrepreneurPulse.com.au  Fact Sheet for Healthcare Providers:  IncredibleEmployment.be  This test is no t yet approved or cleared by the [place] FDA and  has been authorized for detection and/or diagnosis of SARS-CoV-2 by FDA under an Emergency Use Authorization (EUA). This EUA will remain  in effect (meaning this test can be used) for the duration of the COVID-19 declaration under Section 564(b)(1) of the Act, 21 U.S.C.section 360bbb-3(b)(1), unless the authorization is terminated  or revoked sooner.       Influenza Aisling Emigh by PCR NEGATIVE NEGATIVE Final   Influenza B by PCR NEGATIVE NEGATIVE Final    Comment: (NOTE) The Xpert Xpress SARS-CoV-2/FLU/RSV plus assay is intended as an aid in the diagnosis of influenza from Nasopharyngeal swab specimens and should not be used as Qais Jowers sole basis for treatment. Nasal washings and aspirates are unacceptable for Xpert Xpress SARS-CoV-2/FLU/RSV testing.  Fact Sheet for Patients: EntrepreneurPulse.com.au  Fact Sheet for Healthcare Providers: IncredibleEmployment.be  This test is not yet approved or cleared by the [place] FDA and has been authorized for detection and/or diagnosis of SARS-CoV-2 by FDA under an Emergency Use Authorization (EUA). This EUA will remain in effect (meaning this test can be used) for the duration of the COVID-19 declaration under Section 564(b)(1) of the Act, 21 U.S.C. section 360bbb-3(b)(1), unless the authorization is terminated or revoked.  Performed at [facility], [address], [place], [place] [address]          Radiology Studies: CT Angio Chest PE W and/or Wo Contrast  Result Date: 11/23/2020 CLINICAL DATA:   Difficulty breathing. EXAM: CT ANGIOGRAPHY CHEST WITH CONTRAST TECHNIQUE: Multidetector CT imaging of the chest was performed using the standard protocol during bolus administration of intravenous contrast. Multiplanar CT image reconstructions and MIPs were obtained to evaluate the vascular anatomy. CONTRAST:  64mL OMNIPAQUE IOHEXOL 350 MG/ML SOLN COMPARISON:  October 02, 2020. FINDINGS: Cardiovascular: Satisfactory opacification of the pulmonary arteries to the segmental level. No evidence of pulmonary embolism. Moderate cardiomegaly is noted. 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 No pericardial effusion. Atherosclerosis of thoracic aorta is noted without dissection. Mediastinum/Nodes: No enlarged mediastinal, hilar, or  axillary lymph nodes. Thyroid gland, trachea, and esophagus demonstrate no significant findings. Lungs/Pleura: No pneumothorax is noted. Moderate right pleural effusion is noted with associated atelectasis of the right lower lobe. Mild subsegmental atelectasis is seen involving the right middle lobe and lingular segment of the left upper lobe. Upper Abdomen: No acute abnormality. Musculoskeletal: No chest wall abnormality. No acute or significant osseous findings. Review of the MIP images confirms the above findings. IMPRESSION: No definite evidence of pulmonary embolus. Moderate size right pleural effusion is noted with associated atelectasis of the right lower lobe. Mild subsegmental atelectasis is also noted in the right middle lobe and lingular segment of left upper lobe. 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 Aortic Atherosclerosis (ICD10-I70.0). Electronically Signed   By: Marijo Conception M.D.   On: 11/23/2020 13:47   DG Chest Port 1 View  Result Date: 11/23/2020 CLINICAL DATA:  Shortness of breath, difficulty breathing EXAM: PORTABLE CHEST 1 VIEW COMPARISON:  11/20/2020 FINDINGS: Cardiomegaly.  Diffuse bilateral airspace disease, worsening since prior study. Bilateral pleural effusions, right larger than left also slightly worsening since prior study. Elevation of the right diaphragm. No acute bony abnormality. IMPRESSION: Worsening bilateral airspace disease and effusions, favor edema/CHF. Electronically Signed   By: Rolm Baptise M.D.   On: 11/23/2020 11:47        Scheduled Meds:  aspirin EC  81 mg Oral Daily   atenolol  50 mg Oral BID   Chlorhexidine Gluconate Cloth  6 each Topical Daily   [START ON 11/25/2020] furosemide  40 mg Intravenous Daily   gabapentin  100 mg Oral QHS   hydrALAZINE  50 mg Oral TID   mouth rinse  15 mL Mouth Rinse BID   melatonin  10 mg Oral QHS   methimazole  2.5 mg Oral Daily   pantoprazole  40 mg Oral Q0600   simvastatin  20 mg Oral QHS   sodium chloride flush  3 mL Intravenous Q12H   Continuous Infusions:  sodium chloride       LOS: 1 day    Time spent: over 30 min    Fayrene Helper, MD [facility]   To contact the attending provider between 7A-7P or the covering provider during after hours 7P-7A, please log into the web site [email or web address] and access using universal  password for that web site. If you do not have the password, please call the hospital operator.  11/24/2020, 9:43 AM

## 2020-11-24 NOTE — Assessment & Plan Note (Signed)
statin

## 2020-11-25 ENCOUNTER — Inpatient Hospital Stay (HOSPITAL_COMMUNITY): Payer: Medicare HMO

## 2020-11-25 DIAGNOSIS — J9602 Acute respiratory failure with hypercapnia: Secondary | ICD-10-CM | POA: Diagnosis not present

## 2020-11-25 DIAGNOSIS — J9601 Acute respiratory failure with hypoxia: Secondary | ICD-10-CM | POA: Diagnosis not present

## 2020-11-25 LAB — PROTEIN, TOTAL: Total Protein: 6.2 g/dL — ABNORMAL LOW (ref 6.5–8.1)

## 2020-11-25 LAB — BODY FLUID CELL COUNT WITH DIFFERENTIAL
Eos, Fluid: 1 %
Lymphs, Fluid: 60 %
Monocyte-Macrophage-Serous Fluid: 27 % — ABNORMAL LOW (ref 50–90)
Neutrophil Count, Fluid: 12 % (ref 0–25)
Total Nucleated Cell Count, Fluid: 352 cu mm (ref 0–1000)

## 2020-11-25 LAB — GLUCOSE, PLEURAL OR PERITONEAL FLUID: Glucose, Fluid: 161 mg/dL

## 2020-11-25 LAB — CBC WITH DIFFERENTIAL/PLATELET
Abs Immature Granulocytes: 0.01 10*3/uL (ref 0.00–0.07)
Basophils Absolute: 0 10*3/uL (ref 0.0–0.1)
Basophils Relative: 0 %
Eosinophils Absolute: 0.1 10*3/uL (ref 0.0–0.5)
Eosinophils Relative: 1 %
HCT: 28 % — ABNORMAL LOW (ref 36.0–46.0)
Hemoglobin: 8.8 g/dL — ABNORMAL LOW (ref 12.0–15.0)
Immature Granulocytes: 0 %
Lymphocytes Relative: 22 %
Lymphs Abs: 1.6 10*3/uL (ref 0.7–4.0)
MCH: 26.3 pg (ref 26.0–34.0)
MCHC: 31.4 g/dL (ref 30.0–36.0)
MCV: 83.8 fL (ref 80.0–100.0)
Monocytes Absolute: 0.8 10*3/uL (ref 0.1–1.0)
Monocytes Relative: 10 %
Neutro Abs: 4.9 10*3/uL (ref 1.7–7.7)
Neutrophils Relative %: 67 %
Platelets: 285 10*3/uL (ref 150–400)
RBC: 3.34 MIL/uL — ABNORMAL LOW (ref 3.87–5.11)
RDW: 16.2 % — ABNORMAL HIGH (ref 11.5–15.5)
WBC: 7.3 10*3/uL (ref 4.0–10.5)
nRBC: 0 % (ref 0.0–0.2)

## 2020-11-25 LAB — TSH: TSH: 2.871 u[IU]/mL (ref 0.350–4.500)

## 2020-11-25 LAB — COMPREHENSIVE METABOLIC PANEL
ALT: 11 U/L (ref 0–44)
AST: 15 U/L (ref 15–41)
Albumin: 3.7 g/dL (ref 3.5–5.0)
Alkaline Phosphatase: 33 U/L — ABNORMAL LOW (ref 38–126)
Anion gap: 8 (ref 5–15)
BUN: 36 mg/dL — ABNORMAL HIGH (ref 8–23)
CO2: 35 mmol/L — ABNORMAL HIGH (ref 22–32)
Calcium: 8.9 mg/dL (ref 8.9–10.3)
Chloride: 92 mmol/L — ABNORMAL LOW (ref 98–111)
Creatinine, Ser: 1.75 mg/dL — ABNORMAL HIGH (ref 0.44–1.00)
GFR, Estimated: 29 mL/min — ABNORMAL LOW (ref >60)
Glucose, Bld: 103 mg/dL — ABNORMAL HIGH (ref 70–99)
Potassium: 3.9 mmol/L (ref 3.5–5.1)
Sodium: 135 mmol/L (ref 135–145)
Total Bilirubin: 0.6 mg/dL (ref 0.3–1.2)
Total Protein: 6.4 g/dL — ABNORMAL LOW (ref 6.5–8.1)

## 2020-11-25 LAB — LACTATE DEHYDROGENASE, PLEURAL OR PERITONEAL FLUID: LD, Fluid: 61 U/L — ABNORMAL HIGH (ref 3–23)

## 2020-11-25 LAB — PHOSPHORUS: Phosphorus: 5.3 mg/dL — ABNORMAL HIGH (ref 2.5–4.6)

## 2020-11-25 LAB — MAGNESIUM: Magnesium: 2.2 mg/dL (ref 1.7–2.4)

## 2020-11-25 LAB — PROTEIN, PLEURAL OR PERITONEAL FLUID: Total protein, fluid: 3.2 g/dL

## 2020-11-25 LAB — LACTATE DEHYDROGENASE: LDH: 109 U/L (ref 98–192)

## 2020-11-25 MED ORDER — FUROSEMIDE 40 MG PO TABS
40.0000 mg | ORAL_TABLET | Freq: Every day | ORAL | Status: DC
  Administered 2020-11-25 – 2020-11-28 (×4): 40 mg via ORAL
  Filled 2020-11-25 (×5): qty 1

## 2020-11-25 MED ORDER — HYDRALAZINE HCL 25 MG PO TABS: 25.0000 mg | ORAL_TABLET | Freq: Four times a day (QID) | ORAL | Status: DC | PRN

## 2020-11-25 NOTE — Procedures (Signed)
Thoracentesis  Procedure Note  Mandy [name]  [id number]  1940-02-21  Date:11/25/20  Time:6:44 PM   Provider Performing:Bradee Common Jerilynn Mages Verlee Monte   Procedure: Thoracentesis with imaging guidance ([id number])  Indication(s) Pleural Effusion  Consent Risks of the procedure as well as the alternatives and risks of each were explained to the patient and/or caregiver.  Consent for the procedure was obtained and is signed in the bedside chart  Anesthesia Topical only with 1% lidocaine    Time Out Verified patient identification, verified procedure, site/side was marked, verified correct patient position, special equipment/implants available, medications/allergies/relevant history reviewed, required imaging and test results available.   Sterile Technique Maximal sterile technique including full sterile barrier drape, hand hygiene, sterile gown, sterile gloves, mask, hair covering, sterile ultrasound probe cover (if used).  Procedure Description Ultrasound was used to identify appropriate pleural anatomy for placement and overlying skin marked.  Area of drainage cleaned and draped in sterile fashion. Lidocaine was used to anesthetize the skin and subcutaneous tissue.  250 cc's of amber appearing fluid was drained from the right pleural space. Catheter then removed and bandaid applied to site.   Complications/Tolerance None; patient tolerated the procedure well. Chest X-ray is ordered to confirm no post-procedural complication.   EBL Minimal   Specimen(s) Pleural fluid

## 2020-11-25 NOTE — Consult Note (Signed)
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 She had been seen in CT surgery clinic 11/18 at which point she was referred to pulmonary for thoracentesis for recurrent R pleural effusion noted on CXR. She presented to ED 11/21 however for worsening dyspnea. We are asked to consult for recurrent pleural effusion.  Here she has been diuresed but her weight is stable. Diuretic limited by AKI. IR evaluated for therapeutic thoracentesis yesterday but pocket deemed insufficient size to warrant it.   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 rate (!) 21, height 4\' 11"  (1.499 m), weight 77 kg, SpO2 100 %.        Intake/Output Summary (Last 24 hours) at 11/25/2020 0901 Last data filed at 11/25/2020 0500 Gross per 24 hour  Intake 480 ml  Output 400 ml  Net 80 ml   Filed Weights   11/23/20 1128 11/25/20 0500  Weight: 76.6 kg 77 kg    Examination: General appearance: 80 y.o., female, NAD,pleasant Eyes: anicteric sclerae,  tracking HENT: NCAT; MMM Neck: Trachea midline; no lymphadenopathy, no JVD Lungs: Diminished R>L, no crackles, no wheeze, with normal respiratory effort CV: RRR, no MRGs  Abdomen: Soft, non-tender; non-distended, BS present  Extremities: nonpitting peripheral edema, warm Psych: Appropriate affect Neuro: Alert and oriented to person and place, no focal deficit    CTA Chest 11/21 reviewed and remarkable for mediastinal LAD/mass at 4R station which appears stable since post-resection CT, loculated right pleural effusion and atelectasis. Assessment of parenchyma limited by expiratory study but looks like she's got some dependent ggo and scarring  TTE 11/22: Severely elevated RVSP, IVC dilation, LA and RA dilation, trivial PCE  Resolved Hospital Problem list     Assessment & Plan:   # Bilateral pleural effusions - in past borderline exudate vs transudate but incomplete data # Mediastinal lymphadenopathy  # RLL atelectasis # Anterior mediastinal mass s/p resection - substernal goiter - check tsh - will plan for right thora today, will send for usual studies  - could entertain EBUS for mediastinal LAD at 4R station on outpatient basis  Best Practice (right click and "Reselect all SmartList Selections" daily)   Per [facility]  Labs   CBC: Recent Labs  Lab 11/23/20 1145 11/23/20 1150 11/23/20 1317 11/24/20 0811 11/25/20 0306  WBC 5.5  --   --  4.0 7.3  NEUTROABS 3.7  --   --  2.9 4.9  HGB 9.9* 11.6* 11.6* 8.8* 8.8*  HCT 31.4* 34.0* 34.0* 28.1* 28.0*  MCV 81.8  --   --  83.9 83.8  PLT 318  --   --  288 008    Basic Metabolic Panel: Recent Labs  Lab  11/23/20 1145 11/23/20 1150 11/23/20 1317 11/24/20 0811 11/25/20 0306  NA 134* 134* 134* 137 135  K 4.0 4.1 3.9 4.2 3.9  CL 92*  --   --  94* 92*  CO2 35*  --   --  35* 35*  GLUCOSE 125*  --   --  117* 103*  BUN 9  --   --  24* 36*  CREATININE 0.91  --   --  1.39* 1.75*  CALCIUM 9.8  --   --  8.9 8.9  MG 2.0  --   --   --   2.2  PHOS  --   --   --   --  5.3*   GFR: Estimated Creatinine Clearance: 23.3 mL/min (A) (by C-G formula based on SCr of 1.75 mg/dL (H)). Recent Labs  Lab 11/23/20 1145 11/24/20 0811 11/25/20 0306  WBC 5.5 4.0 7.3    Liver Function Tests: Recent Labs  Lab 11/23/20 1145 11/24/20 0811 11/25/20 0306  AST 19 15 15   ALT 8 10 11   ALKPHOS 35* 29* 33*  BILITOT 0.4 0.4 0.6  PROT 7.6 6.6 6.4*  ALBUMIN 4.4 3.5 3.7   No results for input(s): LIPASE, AMYLASE in the last 168 hours. No results for input(s): AMMONIA in the last 168 hours.  ABG    Component Value Date/Time   PHART 7.365 11/23/2020 2130   PCO2ART 65.5 ([place]) 11/23/2020 2130   PO2ART 80.7 (L) 11/23/2020 2130   HCO3 36.5 (H) 11/23/2020 2130   TCO2 44 (H) 11/23/2020 1317   O2SAT 95.5 11/23/2020 2130     Coagulation Profile: No results for input(s): INR, PROTIME in the last 168 hours.  Cardiac Enzymes: No results for input(s): CKTOTAL, CKMB, CKMBINDEX, TROPONINI in the last 168 hours.  HbA1C: Hgb A1c MFr Bld  Date/Time Value Ref Range Status  11/03/2020 06:43 PM 5.9 (H) 4.8 - 5.6 % Final    Comment:    (NOTE) Pre diabetes:          5.7%-6.4%  Diabetes:              >6.4%  Glycemic control for   <7.0% adults with diabetes   09/16/2020 08:33 AM 5.9 (H) 4.8 - 5.6 % Final    Comment:    (NOTE) Pre diabetes:          5.7%-6.4%  Diabetes:              >6.4%  Glycemic control for   <7.0% adults with diabetes     CBG: No results for input(s): GLUCAP in the last 168 hours.  Review of Systems:   12 point review of systems is negative except as in HPI  Past Medical History:  She,  has a past medical history of Aortic atherosclerosis ([place]) (10/08/2018), Arthritis, Diabetes mellitus without complication ([place]), GERD (gastroesophageal reflux disease), Hepatic steatosis (10/08/2018), Hyperlipidemia, Hypertension, Hyperthyroidism, Mass of right ovary (10/08/2018), Osteoporosis, Pneumonia, SBO (small bowel  obstruction) ([place]) (08/09/2018), and SBO (small bowel obstruction) ([place]) (08/09/2018).   Surgical History:   Past Surgical History:  Procedure Laterality Date   CATARACT EXTRACTION W/ INTRAOCULAR LENS  IMPLANT, BILATERAL     COLONOSCOPY       Social History:   reports that she has never smoked. She has never used smokeless tobacco. She reports that she does not drink alcohol and does not use drugs.   Family History:  Her family history includes Kidney disease in her son. There is no history of Colon cancer  or Breast cancer.   Allergies No Known Allergies   Home Medications  Prior to Admission medications   Medication Sig Start Date End Date Taking? Authorizing Provider  acetaminophen (TYLENOL) 500 MG tablet Take 1,000 mg by mouth every 6 (six) hours as needed for fever or headache (pain).   Yes [provider]  albuterol (PROVENTIL) (2.5 MG/3ML) 0.083% nebulizer solution Take 2.5 mg by nebulization every 6 (six) hours as needed for wheezing or shortness of breath.   Yes [provider]  albuterol (VENTOLIN HFA) 108 (90 Base) MCG/ACT inhaler Inhale 2 puffs into the lungs every 6 (six) hours as needed for wheezing or shortness of breath. 06/30/20  Yes Thurnell Lose, MD  alendronate (FOSAMAX) 70 MG tablet Take 70 mg by mouth every Tuesday. Take with a full glass of water on an empty stomach.   Yes [provider]  aspirin EC 81 MG tablet Take 81 mg by mouth daily. Swallow whole.   Yes [provider]  atenolol (TENORMIN) 50 MG tablet Take 50 mg by mouth 2 (two) times daily.   Yes [provider]  calcium carbonate (OSCAL) 1500 (600 Ca) MG TABS tablet Take 600 mg of elemental calcium by mouth 2 (two) times daily with a meal.   Yes [provider]  diazepam (VALIUM) 2 MG tablet Take 2 mg by mouth every 12 (twelve) hours as needed for anxiety.   Yes [provider]  diclofenac sodium (VOLTAREN) 1 % GEL Apply 4 g topically 4  (four) times daily as needed for pain. shoulder 09/13/18  Yes [provider]  furosemide (LASIX) 40 MG tablet Take 1 tablet (40 mg total) by mouth 2 (two) times daily. Patient taking differently: Take 40 mg by mouth every morning. 10/09/20  Yes Nicole Kindred A, DO  gabapentin (NEURONTIN) 100 MG capsule Take 100 mg by mouth at bedtime. 07/18/18  Yes [provider]  guaiFENesin (MUCINEX) 600 MG 12 hr tablet Take 600 mg by mouth 2 (two) times daily as needed for cough.   Yes [provider]  hydrALAZINE (APRESOLINE) 100 MG tablet Take 0.5 tablets (50 mg total) by mouth 3 (three) times daily. 10/09/20  Yes Nicole Kindred A, DO  melatonin 5 MG TABS Take 10 mg by mouth at bedtime.   Yes [provider]  methimazole (TAPAZOLE) 5 MG tablet Take 0.5 tablets (2.5 mg total) by mouth daily. 02/04/19  Yes Hosie Poisson, MD  Multiple Vitamin (MULTIVITAMIN WITH MINERALS) TABS tablet Take 1 tablet by mouth daily.   Yes [provider]  OXYGEN Inhale 2-4 L into the lungs continuous.   Yes [provider]  pantoprazole (PROTONIX) 40 MG tablet Take 1 tablet (40 mg total) by mouth daily at 6 (six) AM. Patient taking differently: Take 40 mg by mouth every morning. 06/30/20  Yes Thurnell Lose, MD  Pseudoeph-CPM-DM-APAP (THERAFLU FLU/COLD/COUGH PO) Take 30 mLs by mouth 2 (two) times daily as needed (cough/congestion).   Yes [provider]  simvastatin (ZOCOR) 20 MG tablet Take 1 tablet (20 mg total) by mouth at bedtime. 06/30/20  Yes Thurnell Lose, MD  vitamin B-12 (CYANOCOBALAMIN) 1000 MCG tablet Take 1,000 mcg by mouth daily.   Yes [provider]  vitamin C (ASCORBIC ACID) 500 MG tablet Take 500 mg by mouth daily.   Yes [provider]  Vitamin D, Cholecalciferol, 25 MCG (1000 UT) TABS Take 1,000 Units by mouth daily.   Yes [provider]  zinc  gluconate 50 MG tablet Take 50 mg by mouth daily.   Yes [provider]   acetaminophen (TYLENOL) 325 MG tablet Take 2 tablets (650 mg total) by mouth every 6 (six) hours as needed for mild pain, fever or headache. Patient not taking: Reported on 11/23/2020 11/06/20   Cherene Altes, MD  MELATONIN ER PO Take 2 tablets by mouth at bedtime as needed (sleep). Patient not taking: Reported on 11/23/2020    [provider]  traMADol (ULTRAM) 50 MG tablet Take 1 tablet (50 mg total) by mouth every 6 (six) hours as needed (mild pain). Patient not taking: Reported on 11/23/2020 09/22/20   John Giovanni, PA-C

## 2020-11-25 NOTE — Progress Notes (Signed)
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  Patient with history of pleural effusion with recent hospitalization and underwent thoracentesis at that time.  Patient was evaluated by cardiothoracic surgery, Dr. Kipp Brood last Friday and x-ray notable for enlarging right-sided pleural effusion and was referred to pulmonology, Dr. Valeta Harms for thoracentesis.  Due to her continued progressing shortness of breath, patient presented to ED for further evaluation.  Patient typically utilizes 2 L nasal cannula at baseline but had increased to 4 L morning of hospital presentation.  Also with reported orthopnea and requiring increased inclination for sleep.  Patient's daughter did give her an extra dose of Lasix night prior to ED presentation without any significant effect.  Denies any recent travel, or changes in diet.  No sick contacts.  In the ED, patient was found to have acute on chronic respiratory failure with hypercapnia and hypoxia.  Patient was placed on BiPAP.  BNP elevated 414.8.  Sodium 134, potassium 4.0, chloride 92, bicarb 35, creatinine 0.91.  Glucose 125.  Magnesium 2.0.  LFTs within normal limits.  Hospitalist consulted for further evaluation management of acute on chronic hypoxic/hypercapnic respiratory failure with recurrent right-sided pleural effusion.   Assessment & Plan:   Principal Problem:   Acute respiratory failure with hypoxia and hypercapnia (HCC) Active Problems:   Diabetes mellitus type 2, controlled (HCC)   Dyslipidemia   Essential hypertension   Hyponatremia    Hyperthyroidism   Pleural effusion on right   AKI (acute kidney injury) ([place])   Anemia   Acute on chronic heart failure with preserved ejection fraction (HFpEF) (HCC)   Chronic respiratory failure with hypoxia, on home oxygen therapy ([place])   Anxiety   Acute on chronic hypoxic/hypercapnic respiratory failure, POA Patient presenting with progressive shortness of breath and found to be hypoxic and hypercapnic with PaCO2 85.0.  Patient initially started on BiPAP and slowly weaned off.  CT angiogram chest with no definite evidence of pulm embolism, moderate size right pleural effusion with associated ectasis right lower lobe, etiology likely secondary to recurrent pleural effusion. --Continue BiPAP nightly and while sleeping --Continue submental oxygen, maintain SPO2 greater than 92%  Right pleural effusion, recurrent Patient with robotic assisted right video thoracoscopy and resection of anterior mediastinal mass 09/17/2020.  Patient has had recurrent effusions since and required thoracentesis on 11/04/2020.  Was evaluated by interventional radiology on 11/24/2020 and felt that pocket was too small for thoracentesis. --PCCM following, appreciate assistance --Pulmonology plans thoracentesis today  Acute on chronic diastolic congestive heart failure BNP elevated 414.8 with recurrent right pleural effusion as above.  Patient was initially started on IV diuresis, but creatinine has increased.  TTE with LVEF 55-60%, mild LVH, RV systolic function mildly reduced, elevated RVSP 83.2, biatrial enlargement, IVC dilated. --Continue Lasix 40 mg p.o. daily --Strict I's and O's and daily weights --BMP daily  Acute renal failure, not POA Etiology likely secondary to overdiuresis with IV furosemide. --Cr 0.91>1.39>1.75 --Discontinued IV furosemide, transition back to oral Lasix --Repeat BMP in a.m.  Essential hypertension --Atenolol 50 mg p.o. twice daily --Furosemide 40 mg p.o. daily --Hydralazine 50  mg p.o. 3 times daily --Continue aspirin and  statin  Anemia, normocytic --Hgb 11.6>8.8 --MCV 83.8 --CBC daily  Hyponatremia Etiology likely secondary to hypervolemic hyponatremia in the setting of volume overload from underlying diastolic CHF. --BMP daily  Type 2 diabetes mellitus Hemoglobin A1c 5.9 on 11/03/2020, well controlled.  Diet controlled at home.  GERD: Protonix 40 mg p.o. daily  Hx Hyperthyroidism TSH 2.871 11/25/2020. --Methimazole 2.5 mg p.o. daily  Neuropathy: Gabapentin her milligrams p.o. nightly  Anxiety: Diazepam 2 mg every 12 hours as needed  Dyslipidemia --Simvastatin 20 mg p.o. nightly   DVT prophylaxis: Place and maintain sequential compression device Start: 11/23/20 1957   Code Status: Full Code Family Communication: No family present at bedside this morning  Disposition Plan:  Level of care: Stepdown Status is: Inpatient  Remains inpatient appropriate because: Awaiting PCCM for evaluation for thoracentesis    Consultants:  Interventional radiology PCCM  Procedures:  None  Antimicrobials:  None   Subjective: Patient seen examined bedside, resting comfortably.  No family present at bedside.  No specific complaints this morning.  States breathing improved, but still not at her typical baseline.  Seen by PCCM this morning with plans for thoracentesis today.  No other questions or concerns at this time.  Denies headache, no chest pain, no palpitations, no abdominal pain.  No acute events overnight per nursing staff.  Objective: Vitals:   11/25/20 0800 11/25/20 1035 11/25/20 1036 11/25/20 1200  BP: (!) 165/54 (!) 174/58 (!) 174/58   Pulse: 66 67    Resp: (!) 21     Temp: 98 F (36.7 C)   97.8 F (36.6 C)  TempSrc: Oral   Oral  SpO2: 100%   100%  Weight:      Height:        Intake/Output Summary (Last 24 hours) at 11/25/2020 1508 Last data filed at 11/25/2020 1039 Gross per 24 hour  Intake 483 ml  Output 400 ml  Net 83 ml    Filed Weights   11/23/20 1128 11/25/20 0500  Weight: 76.6 kg 77 kg    Examination:  General exam: Appears calm and comfortable  Respiratory system: Breath sounds decreased right base, no wheezing/crackles, normal respiratory effort, on 2 L nasal cannula (baseline 2LNC) Cardiovascular system: S1 & S2 heard, RRR. No JVD, murmurs, rubs, gallops or clicks. No pedal edema. Gastrointestinal system: Abdomen is nondistended, soft and nontender. No organomegaly or masses felt. Normal bowel sounds heard. Central nervous system: Alert and oriented. No focal neurological deficits. Extremities: Symmetric 5 x 5 power. Skin: No rashes, lesions or ulcers Psychiatry: Judgement and insight appear normal. Mood & affect appropriate.     Data Reviewed: I have personally reviewed following labs and imaging studies  CBC: Recent Labs  Lab 11/23/20 1145 11/23/20 1150 11/23/20 1317 11/24/20 0811 11/25/20 0306  WBC 5.5  --   --  4.0 7.3  NEUTROABS 3.7  --   --  2.9 4.9  HGB 9.9* 11.6* 11.6* 8.8* 8.8*  HCT 31.4* 34.0* 34.0* 28.1* 28.0*  MCV 81.8  --   --  83.9 83.8  PLT 318  --   --  288 619   Basic Metabolic Panel: Recent Labs  Lab 11/23/20 1145 11/23/20 1150 11/23/20 1317 11/24/20 0811 11/25/20 0306  NA 134* 134* 134* 137 135  K 4.0 4.1 3.9 4.2 3.9  CL 92*  --   --  94* 92*  CO2 35*  --   --  35* 35*  GLUCOSE 125*  --   --  117* 103*  BUN 9  --   --  24* 36*  CREATININE 0.91  --   --  1.39* 1.75*  CALCIUM 9.8  --   --  8.9 8.9  MG 2.0  --   --   --  2.2  PHOS  --   --   --   --  5.3*   GFR: Estimated Creatinine Clearance: 23.3 mL/min (A) (by C-G formula based on SCr of 1.75 mg/dL (H)). Liver Function Tests: Recent Labs  Lab 11/23/20 1145 11/24/20 0811 11/25/20 0306  AST 19 15 15   ALT 8 10 11   ALKPHOS 35* 29* 33*  BILITOT 0.4 0.4 0.6  PROT 7.6 6.6 6.4*  ALBUMIN 4.4 3.5 3.7   No results for input(s): LIPASE, AMYLASE in the last 168 hours. No results for input(s): AMMONIA  in the last 168 hours. Coagulation Profile: No results for input(s): INR, PROTIME in the last 168 hours. Cardiac Enzymes: No results for input(s): CKTOTAL, CKMB, CKMBINDEX, TROPONINI in the last 168 hours. BNP (last 3 results) No results for input(s): PROBNP in the last 8760 hours. HbA1C: No results for input(s): HGBA1C in the last 72 hours. CBG: No results for input(s): GLUCAP in the last 168 hours. Lipid Profile: No results for input(s): CHOL, HDL, LDLCALC, TRIG, CHOLHDL, LDLDIRECT in the last 72 hours. Thyroid Function Tests: Recent Labs    11/25/20 0955  TSH 2.871   Anemia Panel: No results for input(s): VITAMINB12, FOLATE, FERRITIN, TIBC, IRON, RETICCTPCT in the last 72 hours. Sepsis Labs: No results for input(s): PROCALCITON, LATICACIDVEN in the last 168 hours.  Recent Results (from the past 240 hour(s))  Resp Panel by RT-PCR (Flu A&B, Covid) Nasopharyngeal Swab     Status: None   Collection Time: 11/23/20 11:31 AM   Specimen: Nasopharyngeal Swab; Nasopharyngeal(NP) swabs in vial transport medium  Result Value Ref Range Status   SARS Coronavirus 2 by RT PCR NEGATIVE NEGATIVE Final    Comment: (NOTE) SARS-CoV-2 target nucleic acids are NOT DETECTED.  The SARS-CoV-2 RNA is generally detectable in upper respiratory specimens during the acute phase of infection. The lowest concentration of SARS-CoV-2 viral copies this assay can detect is 138 copies/mL. A negative result does not preclude SARS-Cov-2 infection and should not be used as the sole basis for treatment or other patient management decisions. A negative result may occur with  improper specimen collection/handling, submission of specimen other than nasopharyngeal swab, presence of viral mutation(s) within the areas targeted by this assay, and inadequate number of viral copies(<138 copies/mL). A negative result must be combined with clinical observations, patient history, and epidemiological information. The  expected result is Negative.  Fact Sheet for Patients:  EntrepreneurPulse.com.au  Fact Sheet for Healthcare Providers:  IncredibleEmployment.be  This test is no t yet approved or cleared by the [place] FDA and  has been authorized for detection and/or diagnosis of SARS-CoV-2 by FDA under an Emergency Use Authorization (EUA). This EUA will remain  in effect (meaning this test can be used) for the duration of the COVID-19 declaration under Section 564(b)(1) of the Act, 21 U.S.C.section 360bbb-3(b)(1), unless the authorization is terminated  or revoked sooner.       Influenza A by PCR NEGATIVE NEGATIVE Final   Influenza B by PCR NEGATIVE NEGATIVE Final    Comment: (NOTE) The Xpert Xpress SARS-CoV-2/FLU/RSV plus assay is intended as an aid in the diagnosis of influenza from Nasopharyngeal swab specimens and should not be used as a sole basis for treatment. Nasal washings  and aspirates are unacceptable for Xpert Xpress SARS-CoV-2/FLU/RSV testing.  Fact Sheet for Patients: EntrepreneurPulse.com.au  Fact Sheet for Healthcare Providers: IncredibleEmployment.be  This test is not yet approved or cleared by the [place] FDA and has been authorized for detection and/or diagnosis of SARS-CoV-2 by FDA under an Emergency Use Authorization (EUA). This EUA will remain in effect (meaning this test can be used) for the duration of the COVID-19 declaration under Section 564(b)(1) of the Act, 21 U.S.C. section 360bbb-3(b)(1), unless the authorization is terminated or revoked.  Performed at [facility], [address] [place], [place] [address]   MRSA Next Gen by PCR, Nasal     Status: None   Collection Time: 11/24/20  4:15 PM   Specimen: Nasal Mucosa; Nasal Swab  Result Value Ref Range Status   MRSA by PCR Next Gen NOT DETECTED NOT DETECTED Final    Comment: (NOTE) The GeneXpert MRSA  Assay (FDA approved for NASAL specimens only), is one component of a comprehensive MRSA colonization surveillance program. It is not intended to diagnose MRSA infection nor to guide or monitor treatment for MRSA infections. Test performance is not FDA approved in patients less than 50 years old. Performed at [facility], [place] [address] [place], [place] [address]          Radiology Studies: [place] CHEST (PLEURAL EFFUSION)  Result Date: 11/24/2020 CLINICAL DATA:  Right pleural effusion, assess volume for thoracentesis EXAM: CHEST ULTRASOUND COMPARISON:  11/23/2020 FINDINGS: Ultrasound performed of the right posterior chest with the patient upright. Small non loculated right pleural effusion noted, not enough to warrant therapeutic thoracentesis. Procedure not performed. IMPRESSION: Small right effusion by ultrasound. Electronically Signed   By: Jerilynn Mages.  Shick M.D.   On: 11/24/2020 14:58   ECHOCARDIOGRAM LIMITED  Result Date: 11/24/2020    ECHOCARDIOGRAM LIMITED REPORT   Patient Name:   OLIVER NEUWIRTH Date of Exam: 11/24/2020 Medical Rec #:  [id number]      Height:       59.0 in Accession #:    [id number]     Weight:       168.9 lb Date of Birth:  1940/05/07       BSA:          1.717 m Patient Age:    62 years       BP:           171/57 mmHg Patient Gender: F              HR:           70 bpm. Exam Location:  Inpatient Procedure: 3D Echo, Limited Echo, Cardiac Doppler and Color Doppler Indications:    I50.40* Unspecified combined systolic (congestive) and diastolic                 (congestive) heart failure  History:        Patient has prior history of Echocardiogram examinations, most                 recent 11/04/2020. Signs/Symptoms:Shortness of Breath and                 Dyspnea; Risk Factors:Diabetes and Dyslipidemia.  Sonographer:    Roseanna Rainbow RDCS Referring Phys: [id number] [place]  1. Left ventricular ejection fraction, by estimation, is 55 to 60%. The left  ventricle has normal function. There is mild left ventricular hypertrophy.  2. Right ventricular systolic function is mildly reduced. The right ventricular  size is normal. There is severely elevated pulmonary artery systolic pressure. The estimated right ventricular systolic pressure is 35.4 mmHg.  3. Left atrial size was mild to moderately dilated.  4. Right atrial size was mildly dilated.  5. The mitral valve is normal in structure. Mild to moderate mitral valve regurgitation. No evidence of mitral stenosis.  6. The aortic valve is tricuspid. Aortic valve regurgitation is not visualized. No aortic stenosis is present.  7. The inferior vena cava is dilated in size with <50% respiratory variability, suggesting right atrial pressure of 15 mmHg.  8. Left pleural effusion noted. FINDINGS  Left Ventricle: Left ventricular ejection fraction, by estimation, is 55 to 60%. The left ventricle has normal function. The left ventricular internal cavity size was normal in size. There is mild left ventricular hypertrophy. Right Ventricle: The right ventricular size is normal. Right ventricular systolic function is mildly reduced. There is severely elevated pulmonary artery systolic pressure. The tricuspid regurgitant velocity is 4.13 m/s, and with an assumed right atrial pressure of 15 mmHg, the estimated right ventricular systolic pressure is 65.6 mmHg. Left Atrium: Left atrial size was mild to moderately dilated. Right Atrium: Right atrial size was mildly dilated. Pericardium: Left pleural effusion noted. Trivial pericardial effusion is present. Mitral Valve: The mitral valve is normal in structure. Mild to moderate mitral valve regurgitation. No evidence of mitral valve stenosis. Tricuspid Valve: Tricuspid valve regurgitation is mild. Aortic Valve: The aortic valve is tricuspid. Aortic valve regurgitation is not visualized. No aortic stenosis is present. Aorta: The aortic root is normal in size and structure. Venous: The  inferior vena cava is dilated in size with less than 50% respiratory variability, suggesting right atrial pressure of 15 mmHg. IAS/Shunts: No atrial level shunt detected by color flow Doppler. LEFT VENTRICLE PLAX 2D LVIDd:         4.50 cm LVIDs:         2.80 cm LV PW:         1.10 cm LV IVS:        1.20 cm LVOT diam:     2.20 cm     3D Volume EF: LVOT Area:     3.80 cm    3D EF:        67 %                            LV EDV:       90 ml                            LV ESV:       30 ml LV Volumes (MOD)           LV SV:        60 ml LV vol d, MOD A2C: 68.0 ml LV vol d, MOD A4C: 78.4 ml LV vol s, MOD A2C: 29.3 ml LV vol s, MOD A4C: 30.4 ml LV SV MOD A2C:     38.7 ml LV SV MOD A4C:     78.4 ml LV SV MOD BP:      46.0 ml IVC IVC diam: 2.40 cm LEFT ATRIUM         Index LA diam:    3.60 cm 2.10 cm/m   AORTA Ao Root diam: 3.20 cm Ao Asc diam:  3.50 cm MITRAL VALVE  TRICUSPID VALVE MV Area (PHT): 3.17 cm       TR Peak grad:   68.2 mmHg MV Decel Time: 239 msec       TR Vmax:        413.00 cm/s MR Peak grad:    110.7 mmHg MR Mean grad:    80.0 mmHg    SHUNTS MR Vmax:         526.00 cm/s  Systemic Diam: 2.20 cm MR Vmean:        427.0 cm/s MR PISA:         1.01 cm MR PISA Eff ROA: 7 mm MR PISA Radius:  0.40 cm MV E velocity: 117.00 cm/s MV A velocity: 95.50 cm/s MV E/A ratio:  1.23 Dalton McleanMD Electronically signed by Franki Monte Signature Date/Time: 11/24/2020/5:48:09 PM    Final         Scheduled Meds:  aspirin EC  81 mg Oral Daily   atenolol  50 mg Oral BID   Chlorhexidine Gluconate Cloth  6 each Topical Daily   furosemide  40 mg Oral Daily   gabapentin  100 mg Oral QHS   hydrALAZINE  50 mg Oral TID   mouth rinse  15 mL Mouth Rinse BID   melatonin  10 mg Oral QHS   methimazole  2.5 mg Oral Daily   pantoprazole  40 mg Oral Q0600   simvastatin  20 mg Oral QHS   sodium chloride flush  3 mL Intravenous Q12H   Continuous Infusions:  sodium chloride       LOS: 2 days    Time  spent: 39 minutes spent on chart review, discussion with nursing staff, consultants, updating family and interview/physical exam; more than 50% of that time was spent in counseling and/or coordination of care.    Daymond Cordts J [place] DO [facility] Available via Epic secure chat 7am-7pm After these hours, please refer to coverage provider listed on [email or web address] 11/25/2020, 3:08 PM

## 2020-11-26 DIAGNOSIS — J9602 Acute respiratory failure with hypercapnia: Secondary | ICD-10-CM | POA: Diagnosis not present

## 2020-11-26 DIAGNOSIS — J90 Pleural effusion, not elsewhere classified: Secondary | ICD-10-CM | POA: Diagnosis not present

## 2020-11-26 DIAGNOSIS — J9601 Acute respiratory failure with hypoxia: Secondary | ICD-10-CM | POA: Diagnosis not present

## 2020-11-26 LAB — BASIC METABOLIC PANEL
Anion gap: 7 (ref 5–15)
BUN: 38 mg/dL — ABNORMAL HIGH (ref 8–23)
CO2: 36 mmol/L — ABNORMAL HIGH (ref 22–32)
Calcium: 8.6 mg/dL — ABNORMAL LOW (ref 8.9–10.3)
Chloride: 93 mmol/L — ABNORMAL LOW (ref 98–111)
Creatinine, Ser: 1.59 mg/dL — ABNORMAL HIGH (ref 0.44–1.00)
GFR, Estimated: 33 mL/min — ABNORMAL LOW (ref >60)
Glucose, Bld: 112 mg/dL — ABNORMAL HIGH (ref 70–99)
Potassium: 3.8 mmol/L (ref 3.5–5.1)
Sodium: 136 mmol/L (ref 135–145)

## 2020-11-26 LAB — CBC
HCT: 28 % — ABNORMAL LOW (ref 36.0–46.0)
Hemoglobin: 8.7 g/dL — ABNORMAL LOW (ref 12.0–15.0)
MCH: 26.1 pg (ref 26.0–34.0)
MCHC: 31.1 g/dL (ref 30.0–36.0)
MCV: 84.1 fL (ref 80.0–100.0)
Platelets: 288 10*3/uL (ref 150–400)
RBC: 3.33 MIL/uL — ABNORMAL LOW (ref 3.87–5.11)
RDW: 16 % — ABNORMAL HIGH (ref 11.5–15.5)
WBC: 6 10*3/uL (ref 4.0–10.5)
nRBC: 0 % (ref 0.0–0.2)

## 2020-11-26 LAB — GLUCOSE, CAPILLARY: Glucose-Capillary: 147 mg/dL — ABNORMAL HIGH (ref 70–99)

## 2020-11-26 NOTE — Plan of Care (Signed)
  Problem: Education: Goal: Knowledge of General Education information will improve Description: Including pain rating scale, medication(s)/side effects and non-pharmacologic comfort measures Outcome: Progressing   Problem: Activity: Goal: Risk for activity intolerance will decrease Outcome: Progressing   Problem: Nutrition: Goal: Adequate nutrition will be maintained Outcome: Progressing   

## 2020-11-26 NOTE — Progress Notes (Signed)
This writer attempted to place pt on nocturnal bipap but pt immediately saying no and shaking her head.  Bipap mask removed per pt request.  RN notified.

## 2020-11-26 NOTE — Progress Notes (Signed)
PT Cancellation Note  Patient Details Name: Mandy [name] MRN: [id number] DOB: 01/12/1940   Cancelled Treatment:    Reason Eval/Treat Not Completed: Other (comment) attempted to see patient, she was unavailable as she was involved in nursing care- will attempt to return if time/schedule allow.   Windell Norfolk, DPT, PN2   Supplemental Physical Therapist [place]    Pager [phone] Acute Rehab Office [phone]

## 2020-11-26 NOTE — Progress Notes (Signed)
Pt wore nocturnal bipap approximately 2 hours then was removed by RN per pt request.  Pt placed back on nasal cannula by RN.

## 2020-11-26 NOTE — Progress Notes (Addendum)
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 She had been seen in CT surgery clinic 11/18 at which point she was referred to pulmonary for thoracentesis for recurrent R pleural effusion noted on CXR. She presented to ED 11/21 however for worsening dyspnea. We are asked to consult for recurrent pleural effusion.  Here she has been diuresed but her weight is stable. Diuretic limited by AKI. IR evaluated for therapeutic thoracentesis yesterday but pocket deemed insufficient size to warrant it.   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) 154/61, pulse 66, temperature 98.6 F (37 C), temperature source Oral, resp. rate 20, height 4\' 11"  (1.499 m), weight 76.1 kg, SpO2 100 %.        Intake/Output Summary (Last 24 hours) at 11/26/2020 1133 Last data filed at 11/26/2020  0900 Gross per 24 hour  Intake 240 ml  Output 750 ml  Net -510 ml    Filed Weights   11/23/20 1128 11/25/20 0500 11/26/20 0500  Weight: 76.6 kg 77 kg 76.1 kg    Examination: General appearance: 80 y.o., female, obese, NAD,pleasant Eyes: anicteric sclerae, tracking HENT: NCAT; MMM Neck: Trachea midline; no lymphadenopathy, no JVD Lungs: No accessory muscle use, decreased breath sounds on right CV: S1-S2 regular, no murmurs Abdomen: Soft, non-tender; non-distended, BS present  Extremities: nonpitting peripheral edema, warm Psych: Appropriate affect Neuro: Alert, interactive, nonfocal   CTA Chest 11/21 reviewed >>mediastinal LAD/mass at 4R station which appears stable since post-resection CT, loculated right pleural effusion and atelectasis, dependent ggo and scarring  TTE 11/22: Severely elevated RVSP 83 , IVC dilation, LA and RA dilation, trivial PCE  Resolved Hospital Problem list     Assessment & Plan:   # Bilateral pleural effusions -protein ratio appears borderline 0.5, LDH is low so favor transudate # Mediastinal lymphadenopathy  # RLL atelectasis # Anterior mediastinal mass s/p resection - substernal goiter, TSH nml #Severe pulmonary hypertension  Recommend Follow pleural fluid cytology but doubt malignancy year. May need further work-up for pulm hypertension including RHC/LHC  Chronic hypercarbic respiratory failure -May be on the basis of chronic hypoventilation related to substernal goiter but would need outpatient sleep studies to clarify and rule out obesity hypoventilation  -Can trial BiPAP during hospital stay to see if this is of any benefit  Discussed with daughter at bedside  Best Practice (right click and "Reselect all SmartList Selections" daily)   [facility]  Labs   CBC: Recent Labs  Lab 11/23/20 1145 11/23/20 1150 11/23/20 1317 11/24/20 0811 11/25/20 0306 11/26/20 0405  WBC 5.5  --   --  4.0 7.3 6.0  NEUTROABS 3.7  --   --  2.9 4.9  --    HGB 9.9* 11.6* 11.6* 8.8* 8.8* 8.7*  HCT 31.4* 34.0* 34.0* 28.1* 28.0* 28.0*  MCV 81.8  --   --  83.9 83.8 84.1  PLT 318  --   --  288 285 288     Basic Metabolic Panel: Recent Labs  Lab 11/23/20 1145 11/23/20 1150 11/23/20 1317 11/24/20 0811 11/25/20 0306 11/26/20 0405  NA 134* 134* 134* 137 135 136  K 4.0 4.1 3.9 4.2 3.9 3.8  CL 92*  --   --  94* 92* 93*  CO2 35*  --   --  35* 35* 36*  GLUCOSE 125*  --   --  117* 103* 112*  BUN 9  --   --  24* 36* 38*  CREATININE 0.91  --   --  1.39* 1.75* 1.59*  CALCIUM 9.8  --   --  8.9 8.9 8.6*  MG 2.0  --   --   --  2.2  --   PHOS  --   --   --   --  5.3*  --     GFR: Estimated Creatinine Clearance: 25.5 mL/min (A) (by C-G formula based on SCr of 1.59 mg/dL (H)). Recent Labs  Lab 11/23/20 1145 11/24/20 0811 11/25/20 0306 11/26/20 0405  WBC 5.5 4.0 7.3 6.0     Liver Function Tests: Recent Labs  Lab 11/23/20 1145 11/24/20 0811 11/25/20 0306 11/25/20 2022  AST 19 15 15   --   ALT 8 10 11   --   ALKPHOS 35* 29* 33*  --   BILITOT 0.4 0.4 0.6  --   PROT 7.6 6.6 6.4* 6.2*  ALBUMIN 4.4 3.5 3.7  --     No results for input(s): LIPASE, AMYLASE in the last 168 hours. No results for input(s): AMMONIA in the last 168 hours.  ABG    Component Value Date/Time   PHART 7.365 11/23/2020 2130   PCO2ART 65.5 ([place]) 11/23/2020 2130   PO2ART 80.7 (L) 11/23/2020 2130   HCO3 36.5 (H) 11/23/2020 2130   TCO2 44 (H) 11/23/2020 1317   O2SAT 95.5 11/23/2020 2130      Coagulation Profile: No results for input(s): INR, PROTIME in the last 168 hours.  Cardiac Enzymes: No results for input(s): CKTOTAL, CKMB, CKMBINDEX, TROPONINI in the last 168 hours.  HbA1C: Hgb A1c MFr Bld  Date/Time Value Ref Range Status  11/03/2020 06:43 PM 5.9 (H) 4.8 - 5.6 % Final    Comment:    (NOTE) Pre diabetes:          5.7%-6.4%  Diabetes:              >6.4%  Glycemic control for   <7.0% adults with diabetes   09/16/2020 08:33 AM 5.9 (H) 4.8 -  5.6 % Final    Comment:    (NOTE) Pre diabetes:          5.7%-6.4%  Diabetes:              >6.4%  Glycemic control for   <7.0% adults with diabetes     Kara Mead MD. FCCP. [facility] Pager : [phone]  If no response to pager , please call [phone] until 7 pm After 7:00 pm call Elink  [phone]   11/26/2020

## 2020-11-26 NOTE — Progress Notes (Signed)
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  Patient with history of pleural effusion with recent hospitalization and underwent thoracentesis at that time.  Patient was evaluated by cardiothoracic surgery, Dr. Kipp Brood last Friday and x-ray notable for enlarging right-sided pleural effusion and was referred to pulmonology, Dr. Valeta Harms for thoracentesis.  Due to her continued progressing shortness of breath, patient presented to ED for further evaluation.  Patient typically utilizes 2 L nasal cannula at baseline but had increased to 4 L morning of hospital presentation.  Also with reported orthopnea and requiring increased inclination for sleep.  Patient's daughter did give her an extra dose of Lasix night prior to ED presentation without any significant effect.  Denies any recent travel, or changes in diet.  No sick contacts.  In the ED, patient was found to have acute on chronic respiratory failure with hypercapnia and hypoxia.  Patient was placed on BiPAP.  BNP elevated 414.8.  Sodium 134, potassium 4.0, chloride 92, bicarb 35, creatinine 0.91.  Glucose 125.  Magnesium 2.0.  LFTs within normal limits.  Hospitalist consulted for further evaluation management of acute on chronic hypoxic/hypercapnic respiratory failure with recurrent right-sided pleural effusion.   Assessment & Plan:   Principal Problem:   Acute respiratory failure with hypoxia and hypercapnia (HCC) Active Problems:   Diabetes mellitus type 2, controlled (HCC)   Dyslipidemia   Essential hypertension   Hyponatremia    Hyperthyroidism   Pleural effusion on right   AKI (acute kidney injury) ([place])   Anemia   Acute on chronic heart failure with preserved ejection fraction (HFpEF) (HCC)   Chronic respiratory failure with hypoxia, on home oxygen therapy ([place])   Anxiety   Acute on chronic hypoxic/hypercapnic respiratory failure, POA Patient presenting with progressive shortness of breath and found to be hypoxic and hypercapnic with PaCO2 85.0.  Patient initially started on BiPAP and slowly weaned off.  CT angiogram chest with no definite evidence of pulm embolism, moderate size right pleural effusion with associated ectasis right lower lobe, etiology likely secondary to recurrent pleural effusion. 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  Patient has had recurrent effusions since and required thoracentesis on 11/04/2020.  Was evaluated by interventional radiology on 11/24/2020 and felt that pocket was too small for thoracentesis.  Patient underwent right thoracentesis by pulmonology on 11/25/2020 with removal of 250 mL of amber appearing fluid. --PCCM following, appreciate assistance --Pleural fluid culture: Pending  Acute on chronic diastolic congestive heart failure BNP elevated 414.8 with recurrent right pleural effusion as above.  Patient was initially started on IV diuresis, but creatinine has increased.  TTE with LVEF 55-60%, mild LVH, RV systolic function mildly reduced, elevated RVSP 83.2, biatrial enlargement, IVC dilated. --Continue Lasix 40 mg p.o. daily --Strict I's and O's and daily weights --BMP daily  Severe pulmonary hypertension TTE 11/22 with severely elevated RVSP of 83, IVC  dilation, biatrial enlargement. --  Pulmonology recommending cardiology evaluation for consider of right/left heart catheterization  Acute renal failure, not POA Etiology likely secondary to overdiuresis with IV furosemide. --Cr 0.91>1.39>1.75>1.59 --Discontinued IV furosemide, transitioned back to oral Lasix --Repeat BMP in a.m.  Essential hypertension --Atenolol 50 mg p.o. twice daily --Furosemide 40 mg p.o. daily --Hydralazine 50 mg p.o. 3 times daily --Continue aspirin and statin  Anemia, normocytic --Hgb 11.6>8.8>8.7 --MCV 83.8 --CBC daily  Hyponatremia Etiology likely secondary to hypervolemic hyponatremia in the setting of volume overload from underlying diastolic CHF. --BMP daily  Type 2 diabetes mellitus Hemoglobin A1c 5.9 on 11/03/2020, well controlled.  Diet controlled at home.  GERD: Protonix 40 mg p.o. daily  Hx Hyperthyroidism TSH 2.871 11/25/2020. --Methimazole 2.5 mg p.o. daily  Neuropathy: Gabapentin 100 mg p.o. nightly  Anxiety: Diazepam 2 mg q12h PRN  Dyslipidemia --Simvastatin 20 mg p.o. nightly  Weakness/deconditioning: --PT evaluation  DVT prophylaxis: Place and maintain sequential compression device Start: 11/23/20 1957   Code Status: Full Code Family Communication: Updated daughter present at bedside this morning  Disposition Plan:  Level of care: Telemetry Status is: Inpatient  Remains inpatient appropriate because: Continue monitoring of renal function and respiratory status, will need cardiology evaluation for consideration of right/left heart catheterization for severe pulmonary hypertension    Consultants:  Interventional radiology PCCM  Procedures:  Right thoracentesis 11/23, Dr. Verlee Monte PCCM  Antimicrobials:  None   Subjective: Patient seen examined bedside, resting comfortably.  Daughter present at bedside.  States breathing is improved following thoracentesis yesterday.  Seen by pulmonology this morning with recommendation  of cardiology evaluation for consideration of right heart catheterization.  No other questions or concerns at this time.  Denies headache, no chest pain, no palpitations, no abdominal pain.  No acute events overnight per nursing staff.  Objective: Vitals:   11/26/20 0037 11/26/20 0420 11/26/20 0500 11/26/20 0829  BP: (!) 123/50 (!) 132/55  (!) 154/61  Pulse: 72 69  66  Resp: 20 20    Temp: 98.4 F (36.9 C) 98.5 F (36.9 C)  98.6 F (37 C)  TempSrc: Oral Oral  Oral  SpO2: 100% 99%  100%  Weight:   76.1 kg   Height:        Intake/Output Summary (Last 24 hours) at 11/26/2020 1226 Last data filed at 11/26/2020 0900 Gross per 24 hour  Intake 240 ml  Output 750 ml  Net -510 ml   Filed Weights   11/23/20 1128 11/25/20 0500 11/26/20 0500  Weight: 76.6 kg 77 kg 76.1 kg    Examination:  General exam: Appears calm and comfortable  Respiratory system: Breath sounds clear to auscultation bilaterally, no wheezing/crackles, normal respiratory effort, on 2 L nasal cannula (baseline 2LNC) Cardiovascular system: S1 & S2 heard, RRR. No JVD, murmurs, rubs, gallops or clicks. No pedal edema. Gastrointestinal system: Abdomen is nondistended, soft and nontender. No organomegaly or masses felt. Normal bowel sounds heard. Central nervous system: Alert and oriented. No focal neurological deficits. Extremities: Symmetric 5 x 5 power. Skin: No rashes, lesions or ulcers Psychiatry: Judgement and insight appear normal. Mood & affect appropriate.     Data Reviewed: I have personally reviewed following labs and imaging studies  CBC: Recent Labs  Lab 11/23/20 1145 11/23/20 1150 11/23/20 1317 11/24/20 0811 11/25/20 0306 11/26/20 0405  WBC 5.5  --   --  4.0 7.3 6.0  NEUTROABS 3.7  --   --  2.9 4.9  --   HGB 9.9* 11.6* 11.6* 8.8* 8.8* 8.7*  HCT  31.4* 34.0* 34.0* 28.1* 28.0* 28.0*  MCV 81.8  --   --  83.9 83.8 84.1  PLT 318  --   --  288 285 035   Basic Metabolic Panel: Recent Labs  Lab  11/23/20 1145 11/23/20 1150 11/23/20 1317 11/24/20 0811 11/25/20 0306 11/26/20 0405  NA 134* 134* 134* 137 135 136  K 4.0 4.1 3.9 4.2 3.9 3.8  CL 92*  --   --  94* 92* 93*  CO2 35*  --   --  35* 35* 36*  GLUCOSE 125*  --   --  117* 103* 112*  BUN 9  --   --  24* 36* 38*  CREATININE 0.91  --   --  1.39* 1.75* 1.59*  CALCIUM 9.8  --   --  8.9 8.9 8.6*  MG 2.0  --   --   --  2.2  --   PHOS  --   --   --   --  5.3*  --    GFR: Estimated Creatinine Clearance: 25.5 mL/min (A) (by C-G formula based on SCr of 1.59 mg/dL (H)). Liver Function Tests: Recent Labs  Lab 11/23/20 1145 11/24/20 0811 11/25/20 0306 11/25/20 2022  AST 19 15 15   --   ALT 8 10 11   --   ALKPHOS 35* 29* 33*  --   BILITOT 0.4 0.4 0.6  --   PROT 7.6 6.6 6.4* 6.2*  ALBUMIN 4.4 3.5 3.7  --    No results for input(s): LIPASE, AMYLASE in the last 168 hours. No results for input(s): AMMONIA in the last 168 hours. Coagulation Profile: No results for input(s): INR, PROTIME in the last 168 hours. Cardiac Enzymes: No results for input(s): CKTOTAL, CKMB, CKMBINDEX, TROPONINI in the last 168 hours. BNP (last 3 results) No results for input(s): PROBNP in the last 8760 hours. HbA1C: No results for input(s): HGBA1C in the last 72 hours. CBG: No results for input(s): GLUCAP in the last 168 hours. Lipid Profile: No results for input(s): CHOL, HDL, LDLCALC, TRIG, CHOLHDL, LDLDIRECT in the last 72 hours. Thyroid Function Tests: Recent Labs    11/25/20 0955  TSH 2.871   Anemia Panel: No results for input(s): VITAMINB12, FOLATE, FERRITIN, TIBC, IRON, RETICCTPCT in the last 72 hours. Sepsis Labs: No results for input(s): PROCALCITON, LATICACIDVEN in the last 168 hours.  Recent Results (from the past 240 hour(s))  Resp Panel by RT-PCR (Flu A&B, Covid) Nasopharyngeal Swab     Status: None   Collection Time: 11/23/20 11:31 AM   Specimen: Nasopharyngeal Swab; Nasopharyngeal(NP) swabs in vial transport medium  Result  Value Ref Range Status   SARS Coronavirus 2 by RT PCR NEGATIVE NEGATIVE Final    Comment: (NOTE) SARS-CoV-2 target nucleic acids are NOT DETECTED.  The SARS-CoV-2 RNA is generally detectable in upper respiratory specimens during the acute phase of infection. The lowest concentration of SARS-CoV-2 viral copies this assay can detect is 138 copies/mL. A negative result does not preclude SARS-Cov-2 infection and should not be used as the sole basis for treatment or other patient management decisions. A negative result may occur with  improper specimen collection/handling, submission of specimen other than nasopharyngeal swab, presence of viral mutation(s) within the areas targeted by this assay, and inadequate number of viral copies(<138 copies/mL). A negative result must be combined with clinical observations, patient history, and epidemiological information. The expected result is Negative.  Fact Sheet for Patients:  EntrepreneurPulse.com.au  Fact Sheet for Healthcare Providers:  IncredibleEmployment.be  This test is no t yet approved or cleared by the [place] and  has been authorized for detection and/or diagnosis of SARS-CoV-2 by FDA under an Emergency Use Authorization (EUA). This EUA will remain  in effect (meaning this test can be used) for the duration of the COVID-19 declaration under Section 564(b)(1) of the Act, 21 U.S.C.section 360bbb-3(b)(1), unless the authorization is terminated  or revoked sooner.       Influenza A by PCR NEGATIVE NEGATIVE Final   Influenza B by PCR NEGATIVE NEGATIVE Final    Comment: (NOTE) The Xpert Xpress SARS-CoV-2/FLU/RSV plus assay is intended as an aid in the diagnosis of influenza from Nasopharyngeal swab specimens and should not be used as a sole basis for treatment. Nasal washings and aspirates are unacceptable for Xpert Xpress SARS-CoV-2/FLU/RSV testing.  Fact Sheet for  Patients: EntrepreneurPulse.com.au  Fact Sheet for Healthcare Providers: IncredibleEmployment.be  This test is not yet approved or cleared by the [place] FDA and has been authorized for detection and/or diagnosis of SARS-CoV-2 by FDA under an Emergency Use Authorization (EUA). This EUA will remain in effect (meaning this test can be used) for the duration of the COVID-19 declaration under Section 564(b)(1) of the Act, 21 U.S.C. section 360bbb-3(b)(1), unless the authorization is terminated or revoked.  Performed at [facility], [address], [place], [place] [address]   MRSA Next Gen by PCR, Nasal     Status: None   Collection Time: 11/24/20  4:15 PM   Specimen: Nasal Mucosa; Nasal Swab  Result Value Ref Range Status   MRSA by PCR Next Gen NOT DETECTED NOT DETECTED Final    Comment: (NOTE) The GeneXpert MRSA Assay (FDA approved for NASAL specimens only), is one component of a comprehensive MRSA colonization surveillance program. It is not intended to diagnose MRSA infection nor to guide or monitor treatment for MRSA infections. Test performance is not FDA approved in patients less than 83 years old. Performed at [facility], [place] [address], [place], [place] [address]   Body fluid culture w Gram Stain     Status: None (Preliminary result)   Collection Time: 11/25/20  6:44 PM   Specimen: Pleura; Body Fluid  Result Value Ref Range Status   Specimen Description   Final    PLEURAL RIGHT Performed at [facility] [address] [place], [place] [address]    Special Requests   Final    NONE Performed at [facility], [place] [address] [place], [place] [address]    Gram Stain   Final    FEW WBC PRESENT, PREDOMINANTLY MONONUCLEAR NO ORGANISMS SEEN    Culture   Final    NO GROWTH < 12 HOURS Performed at [facility] [address] [place],  [place] [address]    Report Status PENDING  Incomplete         Radiology Studies: [place] CHEST (PLEURAL EFFUSION)  Result Date: 11/24/2020 CLINICAL DATA:  Right pleural effusion, assess volume for thoracentesis EXAM: CHEST ULTRASOUND COMPARISON:  11/23/2020 FINDINGS: Ultrasound performed of the right posterior chest with the patient upright. Small non loculated right pleural effusion noted, not enough to warrant therapeutic thoracentesis. Procedure not performed. IMPRESSION: Small right effusion by ultrasound. Electronically Signed   By: Jerilynn Mages.  Shick M.D.   On: 11/24/2020 14:58   DG CHEST PORT 1 VIEW  Result Date: 11/25/2020 CLINICAL DATA:  Thoracentesis EXAM: PORTABLE CHEST 1 VIEW COMPARISON:  11/23/2020 FINDINGS: Single frontal  view of the chest demonstrates persistent enlargement of the cardiac silhouette. There are bilateral pleural effusions, decreased on the right after thoracentesis. No evidence of pneumothorax. No acute bony abnormalities. IMPRESSION: 1. Decreased right pleural effusion after thoracentesis. No evidence of complication. 2. Stable left pleural effusion. Electronically Signed   By: Randa Ngo M.D.   On: 11/25/2020 19:44   ECHOCARDIOGRAM LIMITED  Result Date: 11/24/2020    ECHOCARDIOGRAM LIMITED REPORT   Patient Name:   KAINA ORENGO Date of Exam: 11/24/2020 Medical Rec #:  [id number]      Height:       59.0 in Accession #:    [id number]     Weight:       168.9 lb Date of Birth:  23-Jun-1940       BSA:          1.717 m Patient Age:    3 years       BP:           171/57 mmHg Patient Gender: F              HR:           70 bpm. Exam Location:  Inpatient Procedure: 3D Echo, Limited Echo, Cardiac Doppler and Color Doppler Indications:    I50.40* Unspecified combined systolic (congestive) and diastolic                 (congestive) heart failure  History:        Patient has prior history of Echocardiogram examinations, most                 recent 11/04/2020. Signs/Symptoms:Shortness of Breath  and                 Dyspnea; Risk Factors:Diabetes and Dyslipidemia.  Sonographer:    Roseanna Rainbow RDCS Referring Phys: [id number] [place]  1. Left ventricular ejection fraction, by estimation, is 55 to 60%. The left ventricle has normal function. There is mild left ventricular hypertrophy.  2. Right ventricular systolic function is mildly reduced. The right ventricular size is normal. There is severely elevated pulmonary artery systolic pressure. The estimated right ventricular systolic pressure is 32.4 mmHg.  3. Left atrial size was mild to moderately dilated.  4. Right atrial size was mildly dilated.  5. The mitral valve is normal in structure. Mild to moderate mitral valve regurgitation. No evidence of mitral stenosis.  6. The aortic valve is tricuspid. Aortic valve regurgitation is not visualized. No aortic stenosis is present.  7. The inferior vena cava is dilated in size with <50% respiratory variability, suggesting right atrial pressure of 15 mmHg.  8. Left pleural effusion noted. FINDINGS  Left Ventricle: Left ventricular ejection fraction, by estimation, is 55 to 60%. The left ventricle has normal function. The left ventricular internal cavity size was normal in size. There is mild left ventricular hypertrophy. Right Ventricle: The right ventricular size is normal. Right ventricular systolic function is mildly reduced. There is severely elevated pulmonary artery systolic pressure. The tricuspid regurgitant velocity is 4.13 m/s, and with an assumed right atrial pressure of 15 mmHg, the estimated right ventricular systolic pressure is 40.1 mmHg. Left Atrium: Left atrial size was mild to moderately dilated. Right Atrium: Right atrial size was mildly dilated. Pericardium: Left pleural effusion noted. Trivial pericardial effusion is present. Mitral Valve: The mitral valve is normal in structure. Mild to moderate mitral valve regurgitation. No evidence of mitral valve stenosis. Tricuspid Valve:  Tricuspid  valve regurgitation is mild. Aortic Valve: The aortic valve is tricuspid. Aortic valve regurgitation is not visualized. No aortic stenosis is present. Aorta: The aortic root is normal in size and structure. Venous: The inferior vena cava is dilated in size with less than 50% respiratory variability, suggesting right atrial pressure of 15 mmHg. IAS/Shunts: No atrial level shunt detected by color flow Doppler. LEFT VENTRICLE PLAX 2D LVIDd:         4.50 cm LVIDs:         2.80 cm LV PW:         1.10 cm LV IVS:        1.20 cm LVOT diam:     2.20 cm     3D Volume EF: LVOT Area:     3.80 cm    3D EF:        67 %                            LV EDV:       90 ml                            LV ESV:       30 ml LV Volumes (MOD)           LV SV:        60 ml LV vol d, MOD A2C: 68.0 ml LV vol d, MOD A4C: 78.4 ml LV vol s, MOD A2C: 29.3 ml LV vol s, MOD A4C: 30.4 ml LV SV MOD A2C:     38.7 ml LV SV MOD A4C:     78.4 ml LV SV MOD BP:      46.0 ml IVC IVC diam: 2.40 cm LEFT ATRIUM         Index LA diam:    3.60 cm 2.10 cm/m   AORTA Ao Root diam: 3.20 cm Ao Asc diam:  3.50 cm MITRAL VALVE                  TRICUSPID VALVE MV Area (PHT): 3.17 cm       TR Peak grad:   68.2 mmHg MV Decel Time: 239 msec       TR Vmax:        413.00 cm/s MR Peak grad:    110.7 mmHg MR Mean grad:    80.0 mmHg    SHUNTS MR Vmax:         526.00 cm/s  Systemic Diam: 2.20 cm MR Vmean:        427.0 cm/s MR PISA:         1.01 cm MR PISA Eff ROA: 7 mm MR PISA Radius:  0.40 cm MV E velocity: 117.00 cm/s MV A velocity: 95.50 cm/s MV E/A ratio:  1.23 Dalton McleanMD Electronically signed by Franki Monte Signature Date/Time: 11/24/2020/5:48:09 PM    Final         Scheduled Meds:  aspirin EC  81 mg Oral Daily   atenolol  50 mg Oral BID   furosemide  40 mg Oral Daily   gabapentin  100 mg Oral QHS   hydrALAZINE  50 mg Oral TID   mouth rinse  15 mL Mouth Rinse BID   melatonin  10 mg Oral QHS   methimazole  2.5 mg Oral Daily   pantoprazole   40 mg Oral Q0600   simvastatin  20 mg  Oral QHS   sodium chloride flush  3 mL Intravenous Q12H   Continuous Infusions:  sodium chloride       LOS: 3 days    Time spent: 39 minutes spent on chart review, discussion with nursing staff, consultants, updating family and interview/physical exam; more than 50% of that time was spent in counseling and/or coordination of care.    Whitney Hillegass J [place] DO [facility] Available via Epic secure chat 7am-7pm After these hours, please refer to coverage provider listed on [email or web address] 11/26/2020, 12:26 PM

## 2020-11-27 ENCOUNTER — Telehealth: Payer: Self-pay | Admitting: Pulmonary Disease

## 2020-11-27 DIAGNOSIS — J9601 Acute respiratory failure with hypoxia: Secondary | ICD-10-CM | POA: Diagnosis not present

## 2020-11-27 DIAGNOSIS — J90 Pleural effusion, not elsewhere classified: Secondary | ICD-10-CM | POA: Diagnosis not present

## 2020-11-27 DIAGNOSIS — J9602 Acute respiratory failure with hypercapnia: Secondary | ICD-10-CM | POA: Diagnosis not present

## 2020-11-27 LAB — BASIC METABOLIC PANEL
Anion gap: 6 (ref 5–15)
BUN: 37 mg/dL — ABNORMAL HIGH (ref 8–23)
CO2: 37 mmol/L — ABNORMAL HIGH (ref 22–32)
Calcium: 8.5 mg/dL — ABNORMAL LOW (ref 8.9–10.3)
Chloride: 92 mmol/L — ABNORMAL LOW (ref 98–111)
Creatinine, Ser: 1.57 mg/dL — ABNORMAL HIGH (ref 0.44–1.00)
GFR, Estimated: 33 mL/min — ABNORMAL LOW (ref >60)
Glucose, Bld: 110 mg/dL — ABNORMAL HIGH (ref 70–99)
Potassium: 3.9 mmol/L (ref 3.5–5.1)
Sodium: 135 mmol/L (ref 135–145)

## 2020-11-27 NOTE — Telephone Encounter (Signed)
Please arrange for outpatient sleep study/split-night and post hospital follow-up in 2 to 3 weeks with APP for pulmonary hypertension and recurrent pleural effusion

## 2020-11-27 NOTE — Progress Notes (Signed)
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 She had been seen in CT surgery clinic 11/18 at which point she was referred to pulmonary for thoracentesis for recurrent R pleural effusion noted on CXR. She presented to ED 11/21 however for worsening dyspnea. We are asked to consult for recurrent pleural effusion.  Here she has been diuresed but her weight is stable. Diuretic limited by AKI. IR evaluated for therapeutic thoracentesis yesterday but pocket deemed insufficient size to warrant it.   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 rate 18, height 4\' 11"  (1.499 m), weight 76.1 kg, SpO2 100 %.        Intake/Output Summary (Last 24 hours) at 11/27/2020 1601 Last data filed at 11/27/2020 1102 Gross per 24 hour  Intake 460 ml  Output 950 ml  Net -490 ml    Filed Weights   11/23/20 1128 11/25/20 0500 11/26/20 0500   Weight: 76.6 kg 77 kg 76.1 kg    Examination: General appearance: 80 y.o., female, obese, out of bed to chair, female, obese, out of bed to chair Eyes: anicteric sclerae, no pallor decreased HENT: NCAT; MMM Neck: Trachea midline; no lymphadenopathy, no JVD Lungs: Decreased breath sounds bilateral, no accessory muscle use CV: S1-S2 regular, no murmurs Abdomen: Soft, non-tender; non-distended, BS present  Extremities: nonpitting peripheral edema, warm Psych: Appropriate affect Neuro: Alert, interactive, nonfocal   CTA Chest 11/21 reviewed >>mediastinal LAD/mass at 4R station which appears stable since post-resection CT, loculated right pleural effusion and atelectasis, dependent ggo and scarring  TTE 11/22: Severely elevated RVSP 83 , IVC dilation, LA and RA dilation, trivial PCE  Resolved Hospital Problem list     Assessment & Plan:   # Bilateral pleural effusions -protein ratio appears borderline 0.5, LDH is low so favor transudate # Mediastinal lymphadenopathy  # RLL atelectasis # Anterior mediastinal mass s/p resection - substernal goiter, TSH nml #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 We will arrange for outpatient sleep study and pulmonary clinic follow-up  PCCM will be available as needed  Best Practice (right click and "Reselect all SmartList Selections" daily)   Per [facility]  Labs   CBC: Recent Labs  Lab 11/23/20 1145 11/23/20 1150 11/23/20 1317 11/24/20 0811 11/25/20 0306 11/26/20 0405  WBC 5.5  --   --  4.0 7.3 6.0  NEUTROABS 3.7  --   --  2.9 4.9  --   HGB 9.9* 11.6* 11.6* 8.8* 8.8* 8.7*  HCT 31.4* 34.0* 34.0*  28.1* 28.0* 28.0*  MCV 81.8  --   --  83.9 83.8 84.1  PLT 318  --   --  288 285 288     Basic Metabolic Panel: Recent Labs  Lab 11/23/20 1145 11/23/20 1150 11/23/20 1317 11/24/20 0811 11/25/20 0306 11/26/20 0405 11/27/20 0334  NA 134*   < > 134* 137 135 136 135  K 4.0   < > 3.9 4.2 3.9 3.8 3.9  CL 92*  --   --  94* 92* 93* 92*  CO2 35*  --   --  35* 35* 36* 37*  GLUCOSE 125*  --   --  117* 103* 112* 110*  BUN 9  --   --  24* 36* 38* 37*  CREATININE 0.91  --   --  1.39* 1.75* 1.59* 1.57*  CALCIUM 9.8  --   --  8.9 8.9 8.6* 8.5*  MG 2.0  --   --   --  2.2  --   --   PHOS  --   --   --   --  5.3*  --   --    < > = values in this interval not displayed.    GFR: Estimated Creatinine Clearance: 25.9 mL/min (A) (by C-G formula based on SCr of 1.57 mg/dL (H)). Recent Labs  Lab 11/23/20 1145 11/24/20 0811 11/25/20 0306 11/26/20 0405  WBC 5.5 4.0 7.3 6.0     Liver Function Tests: Recent Labs  Lab 11/23/20 1145 11/24/20 0811 11/25/20 0306 11/25/20 2022  AST 19 15 15   --   ALT 8 10 11   --   ALKPHOS 35* 29* 33*  --   BILITOT 0.4 0.4 0.6  --   PROT 7.6 6.6 6.4* 6.2*  ALBUMIN 4.4 3.5 3.7  --     No results for input(s): LIPASE, AMYLASE in the last 168 hours. No results for input(s): AMMONIA in the last 168 hours.  ABG    Component Value Date/Time   PHART 7.365 11/23/2020 2130   PCO2ART 65.5 ([place]) 11/23/2020 2130   PO2ART 80.7 (L) 11/23/2020 2130   HCO3 36.5 (H) 11/23/2020 2130   TCO2 44 (H) 11/23/2020 1317   O2SAT 95.5 11/23/2020 2130      Coagulation Profile: No results for input(s): INR, PROTIME in the last 168 hours.  Cardiac Enzymes: No results for input(s): CKTOTAL, CKMB, CKMBINDEX, TROPONINI in the last 168 hours.  HbA1C: Hgb A1c MFr Bld  Date/Time Value Ref Range Status  11/03/2020 06:43 PM 5.9 (H) 4.8 - 5.6 % Final    Comment:    (NOTE) Pre diabetes:          5.7%-6.4%  Diabetes:              >6.4%  Glycemic control for   <7.0% adults  with diabetes   09/16/2020 08:33 AM 5.9 (H) 4.8 - 5.6 % Final    Comment:    (NOTE) Pre diabetes:          5.7%-6.4%  Diabetes:              >6.4%  Glycemic control for   <7.0% adults with diabetes     Kara Mead MD. FCCP. [facility] Pager : [phone]  If no response to pager , please call [phone] until 7 pm After 7:00 pm call Elink  [phone]   11/27/2020

## 2020-11-27 NOTE — Evaluation (Signed)
Physical Therapy Evaluation Patient Details Name: Mandy [name] MRN: [id number] DOB: 1940/01/30 Today's Date: 11/27/2020  History of Present Illness  81yo female who presented on 11/21 with increased SOB and DOE, difficulty maintaining SpO2 in the 90s with activity. Of note, she had a recent hospitalization for pleural effusion and SOB. Admitted with acute respiratory failure with hypoxia and hypercapnia, R pleural effusion, and acute on chronic heart failure. PMH OA, DM, HLD, HTN, CHF, osteoporosis  Clinical Impression   Patient received in bed, pleasant and cooperative today; interpreter in her dialect Andria [place]) was not available, she was able to understand and follow simple commands in [place] with visual demonstration/multimodal cues and extra time however. Able to get to EOB and transfer to recliner with Min-ModA without device today, very weak and needed a boost swinging hips far enough around to fully make it into the recliner. Required 4LPM to maintain sats >90% with activity- see sat note for details. RR elevated quickly even with lower level activities today. Left up in recliner with chair alarm active, all needs otherwise met and nursing staff aware of pt status. From prior PT notes sounds like she has good support from family and mobilized well last hospitalization, so for now we will aim for skilled HHPT f/u- but must consider SNF if progress is slow.      Recommendations for follow up therapy are one component of a multi-disciplinary discharge planning process, led by the attending physician.  Recommendations may be updated based on patient status, additional functional criteria and insurance authorization.  Follow Up Recommendations Home health PT (may need to consider SNF if progress is slow)    Assistance Recommended at Discharge Frequent or constant Supervision/Assistance  Functional Status Assessment Patient has had a recent decline in their functional status and demonstrates the  ability to make significant improvements in function in a reasonable and predictable amount of time.  Equipment Recommendations  BSC/3in1;Wheelchair (measurements PT);Wheelchair cushion (measurements PT);Rolling walker (2 wheels)    Recommendations for Other Services       Precautions / Restrictions Precautions Precautions: Fall;Other (comment) Precaution Comments: watch O2/HR/RR Restrictions Weight Bearing Restrictions: No      Mobility  Bed Mobility Overal bed mobility: Needs Assistance Bed Mobility: Supine to Sit     Supine to sit: Min assist     General bed mobility comments: needed MinA to scoot hips all the way around to being square at EOB, increased time and effort    Transfers Overall transfer level: Needs assistance Equipment used: None Transfers: Sit to/from Stand;Bed to chair/wheelchair/BSC Sit to Stand: Mod assist Stand pivot transfers: Mod assist         General transfer comment: needed ModA to boost to her feet and gain balance today, from there continued to require ModA to pivot hips safely around to the recliner; RR quite elevated even with basic transfer tasks, needed visual demonstration to slow RR/breathe through nose instead of mouth    Ambulation/Gait               General Gait Details: deferred- RR quickly elevated with low level activities, fatigued  Stairs            Wheelchair Mobility    Modified Rankin (Stroke Patients Only)       Balance Overall balance assessment: Needs assistance Sitting-balance support: Bilateral upper extremity supported;Feet unsupported Sitting balance-Leahy Scale: Good     Standing balance support: No upper extremity supported;During functional activity Standing balance-Leahy Scale: Poor  Pertinent Vitals/Pain Pain Assessment: Faces Faces Pain Scale: No hurt Pain Intervention(s): Limited activity within patient's tolerance;Monitored during session     [place] expects to be discharged to:: Private residence Living Arrangements: Children Available Help at Discharge: Family;Available 24 hours/day Type of Home: House Home Access: Level entry       Home Layout: One level Home Equipment: Conservation officer, nature (2 wheels) Additional Comments: family not available/unable to get interpreter for correct dialect- pt able to follow basic commands in [place] but could provide specific details, all info taken from prior charting    Prior Function               Mobility Comments: not clear ADLs Comments: not clear     Hand Dominance   Dominant Hand: Right    Extremity/Trunk Assessment   Upper Extremity Assessment Upper Extremity Assessment: Generalized weakness    Lower Extremity Assessment Lower Extremity Assessment: Generalized weakness    Cervical / Trunk Assessment Cervical / Trunk Assessment: Kyphotic  Communication   Communication: Prefers language other than English ([place] creole [place]))  Cognition Arousal/Alertness: Awake/alert Behavior During Therapy: WFL for tasks assessed/performed Overall Cognitive Status: Within Functional Limits for tasks assessed                                          General Comments General comments (skin integrity, edema, etc.): see SpO2 note for oxygen sats with activity    Exercises     Assessment/Plan    PT Assessment Patient needs continued PT services  PT Problem List Decreased strength;Obesity;Decreased activity tolerance;Decreased balance;Decreased mobility;Cardiopulmonary status limiting activity;Decreased coordination       PT Treatment Interventions DME instruction;Balance training;Gait training;Stair training;Functional mobility training;Patient/family education;Therapeutic activities;Therapeutic exercise    PT Goals (Current goals can be found in the Care Plan section)  Acute Rehab PT Goals Patient Stated Goal: feel better PT  Goal Formulation: With patient Time For Goal Achievement: 12/11/20 Potential to Achieve Goals: Fair    Frequency Min 3X/week   Barriers to discharge        Co-evaluation               AM-PAC PT "6 Clicks" Mobility  Outcome Measure Help needed turning from your back to your side while in a flat bed without using bedrails?: A Little Help needed moving from lying on your back to sitting on the side of a flat bed without using bedrails?: A Little Help needed moving to and from a bed to a chair (including a wheelchair)?: A Lot Help needed standing up from a chair using your arms (e.g., wheelchair or bedside chair)?: A Lot Help needed to walk in hospital room?: A Lot Help needed climbing 3-5 steps with a railing? : Total 6 Click Score: 13    End of Session Equipment Utilized During Treatment: Gait belt Activity Tolerance: Patient limited by fatigue;Patient tolerated treatment well Patient left: in chair;with call bell/phone within reach;with chair alarm set Nurse Communication: Mobility status;Other (comment) (amb sat results)      Time: 5501-5868 PT Time Calculation (min) (ACUTE ONLY): 28 min   Charges:   PT Evaluation $PT Eval Moderate Complexity: 1 Mod PT Treatments $Therapeutic Activity: 8-22 mins       Windell Norfolk, DPT, PN2   Supplemental Physical Therapist [place]    Pager [phone] Acute Rehab Office [phone]

## 2020-11-27 NOTE — Progress Notes (Signed)
   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 She had been seen in CT surgery clinic 11/18 at which point she was referred to pulmonary for thoracentesis for recurrent R pleural effusion noted on CXR. She presented to ED 11/21 however for worsening dyspnea. We are asked to consult for recurrent pleural effusion.  Here she has been diuresed but her weight is stable. Diuretic limited by AKI. IR evaluated for therapeutic thoracentesis yesterday but pocket deemed insufficient size to warrant it.   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 rate 19, height 4\' 11"  (1.499 m), weight 76.1 kg, SpO2 100 %.        Intake/Output Summary (Last 24 hours) at 11/27/2020 1117 Last data filed at 11/27/2020 1102 Gross per 24 hour  Intake 700 ml  Output 950 ml  Net -250 ml   Filed Weights   11/23/20 1128  11/25/20 0500 11/26/20 0500  Weight: 76.6 kg 77 kg 76.1 kg    Examination: General:  adult female sitting up in chair in NAD HEENT: MM pink/moist, [place] O2, anicteric  Neuro: awake, alert, speech clear, MAE / non-focal  CV: s1s2 RRR, no m/r/g PULM:  non-labored at rest, lungs bilaterally diminished  GI: soft, bsx4 active  Extremities: warm/dry, no edema  Skin: no rashes or lesions  Resolved Hospital Problem list     Assessment & Plan:   Bilateral pleural effusions -protein ratio appears borderline 0.5, LDH is low so favor transudate Mediastinal lymphadenopathy  RLL atelectasis Anterior mediastinal mass s/p resection - substernal goiter, TSH nml Severe pulmonary hypertension  -follow up pleural fluid cytology  -appreciate [facility] /Cardiology for RHC evaluation  -pulmonary hygiene -IS, mobilize  -wean O2 for sats >90% -lasix for negative balance  -follow I/O's  -follow intermittent CXR   Chronic hypercarbic respiratory failure  May be on the basis of chronic hypoventilation related to substernal goiter but would need outpatient sleep studies to clarify and rule out obesity hypoventilation  -consider outpatient sleep study    Best Practice (right click and "Reselect all SmartList Selections" daily)   Per [facility]        [name], MSN, APRN, NP-C, AGACNP-BC [facility] 11/27/2020, 11:17 AM   Please see [email or web address] for pager details.   From 7A-7P if no response, please call [phone] After hours, please call ELink [phone]

## 2020-11-27 NOTE — Progress Notes (Signed)
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  Patient with history of pleural effusion with recent hospitalization and underwent thoracentesis at that time.  Patient was evaluated by cardiothoracic surgery, Dr. Kipp Brood last Friday and x-ray notable for enlarging right-sided pleural effusion and was referred to pulmonology, Dr. Valeta Harms for thoracentesis.  Due to her continued progressing shortness of breath, patient presented to ED for further evaluation.  Patient typically utilizes 2 L nasal cannula at baseline but had increased to 4 L morning of hospital presentation.  Also with reported orthopnea and requiring increased inclination for sleep.  Patient's daughter did give her an extra dose of Lasix night prior to ED presentation without any significant effect.  Denies any recent travel, or changes in diet.  No sick contacts.  In the ED, patient was found to have acute on chronic respiratory failure with hypercapnia and hypoxia.  Patient was placed on BiPAP.  BNP elevated 414.8.  Sodium 134, potassium 4.0, chloride 92, bicarb 35, creatinine 0.91.  Glucose 125.  Magnesium 2.0.  LFTs within normal limits.  Hospitalist consulted for further evaluation management of acute on chronic hypoxic/hypercapnic respiratory failure with recurrent right-sided pleural effusion.   Assessment & Plan:   Principal Problem:   Acute respiratory failure with hypoxia and hypercapnia (HCC) Active Problems:   Diabetes mellitus type 2, controlled (HCC)   Dyslipidemia   Essential hypertension   Hyponatremia    Hyperthyroidism   Pleural effusion on right   AKI (acute kidney injury) ([facility])   Anemia   Acute on chronic heart failure with preserved ejection fraction (HFpEF) (HCC)   Chronic respiratory failure with hypoxia, on home oxygen therapy ([facility])   Anxiety   Acute on chronic hypoxic/hypercapnic respiratory failure, POA Patient presenting with progressive shortness of breath and found to be hypoxic and hypercapnic with PaCO2 85.0.  Patient initially started on BiPAP and slowly weaned off.  CT angiogram chest with no definite evidence of pulm embolism, moderate size right pleural effusion with associated ectasis right lower lobe, etiology likely secondary to recurrent pleural effusion. 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  Patient has had recurrent effusions since and required thoracentesis on 11/04/2020.  Was evaluated by interventional radiology on 11/24/2020 and felt that pocket was too small for thoracentesis.  Patient underwent right thoracentesis by pulmonology on 11/25/2020 with removal of 250 mL of amber appearing fluid. --PCCM following, appreciate assistance --Pleural fluid culture: No growth x 2 days --Repeat chest x-ray in the a.m.  Acute on chronic diastolic congestive heart failure BNP elevated 414.8 with recurrent right pleural effusion as above.  Patient was initially started on IV diuresis, but creatinine has increased.  TTE with LVEF 55-60%, mild LVH, RV systolic function mildly reduced, elevated RVSP 83.2, biatrial enlargement, IVC dilated. --Continue Lasix 40 mg p.o. daily --Strict I's and O's and  daily  weights --BMP daily  Severe pulmonary hypertension TTE 11/22 with severely elevated RVSP of 83, IVC dilation, biatrial enlargement. --Pulmonology recommending cardiology evaluation for consider of right/left heart catheterization; they will evaluate on 11/26  Acute renal failure, not POA Etiology likely secondary to overdiuresis with IV furosemide. --Cr 0.91>1.39>1.75>1.59>1.57 --Discontinued IV furosemide, transitioned back to oral Lasix -- BMP daily  Essential hypertension --Atenolol 50 mg p.o. twice daily --Furosemide 40 mg p.o. daily --Hydralazine 50 mg p.o. 3 times daily --Continue aspirin and statin  Anemia, normocytic --Hgb 11.6>8.8>8.7 --MCV 83.8 --CBC daily  Hyponatremia Etiology likely secondary to hypervolemic hyponatremia in the setting of volume overload from underlying diastolic CHF. --BMP daily  Type 2 diabetes mellitus Hemoglobin A1c 5.9 on 11/03/2020, well controlled.  Diet controlled at home.  GERD: Protonix 40 mg p.o. daily  Hx Hyperthyroidism TSH 2.871 11/25/2020. --Methimazole 2.5 mg p.o. daily  Neuropathy: Gabapentin 100 mg p.o. nightly  Anxiety: Diazepam 2 mg q12h PRN  Dyslipidemia --Simvastatin 20 mg p.o. nightly  Weakness/deconditioning: --PT currently recommending home health PT but may need to consider SNF if progress low --OT consult: Pending  DVT prophylaxis: Place and maintain sequential compression device Start: 11/23/20 1957   Code Status: Full Code Family Communication: No family present at bedside this morning or this afternoon  Disposition Plan:  Level of care: Telemetry Status is: Inpatient  Remains inpatient appropriate because: Continue monitoring of renal function and respiratory status, will need cardiology evaluation for consideration of right/left heart catheterization for severe pulmonary hypertension    Consultants:  Interventional radiology PCCM  Procedures:  Right thoracentesis 11/23, Dr. Verlee Monte  PCCM  Antimicrobials:  None   Subjective: Patient seen examined bedside, resting comfortably.  No family present.  Lying in bed eating breakfast.  Nurse tech present at bedside.  Consulted cardiology for recommendation of consideration of right/left heart catheterization due to severe pulmonary hypertension as recommended by pulmonology, they will evaluate tomorrow morning.  No other questions or concerns at this time.  Denies headache, no chest pain, no palpitations, no abdominal pain.  No acute events overnight per nursing staff.  Objective: Vitals:   11/26/20 0829 11/26/20 1319 11/26/20 2113 11/27/20 0615  BP: (!) 154/61 (!) 123/54 (!) 135/58 (!) 133/55  Pulse: 66 70 77 73  Resp:  (!) 22 19   Temp: 98.6 F (37 C) 98.4 F (36.9 C) 98.6 F (37 C) 98.4 F (36.9 C)  TempSrc: Oral Oral Oral Oral  SpO2: 100% 100% 98% 100%  Weight:      Height:        Intake/Output Summary (Last 24 hours) at 11/27/2020 1217 Last data filed at 11/27/2020 1102 Gross per 24 hour  Intake 700 ml  Output 950 ml  Net -250 ml   Filed Weights   11/23/20 1128 11/25/20 0500 11/26/20 0500  Weight: 76.6 kg 77 kg 76.1 kg    Examination:  General exam: Appears calm and comfortable  Respiratory system: Breath sounds clear to auscultation bilaterally, mild left-sided inspiratory wheezing, no crackles, normal respiratory effort, on 2 L nasal cannula at rest (baseline 2-4L [facility]) Cardiovascular system: S1 & S2 heard, RRR. No JVD, murmurs, rubs, gallops or clicks. No pedal edema. Gastrointestinal system: Abdomen is nondistended, soft and nontender. No organomegaly or masses felt. Normal bowel sounds heard. Central nervous system: Alert and oriented. No focal neurological deficits. Extremities: Symmetric 5 x 5 power. Skin: No rashes, lesions or ulcers Psychiatry: Judgement and insight appear normal. Mood & affect appropriate.     Data  Reviewed: I have personally reviewed following labs and imaging  studies  CBC: Recent Labs  Lab 11/23/20 1145 11/23/20 1150 11/23/20 1317 11/24/20 0811 11/25/20 0306 11/26/20 0405  WBC 5.5  --   --  4.0 7.3 6.0  NEUTROABS 3.7  --   --  2.9 4.9  --   HGB 9.9* 11.6* 11.6* 8.8* 8.8* 8.7*  HCT 31.4* 34.0* 34.0* 28.1* 28.0* 28.0*  MCV 81.8  --   --  83.9 83.8 84.1  PLT 318  --   --  288 285 759   Basic Metabolic Panel: Recent Labs  Lab 11/23/20 1145 11/23/20 1150 11/23/20 1317 11/24/20 0811 11/25/20 0306 11/26/20 0405 11/27/20 0334  NA 134*   < > 134* 137 135 136 135  K 4.0   < > 3.9 4.2 3.9 3.8 3.9  CL 92*  --   --  94* 92* 93* 92*  CO2 35*  --   --  35* 35* 36* 37*  GLUCOSE 125*  --   --  117* 103* 112* 110*  BUN 9  --   --  24* 36* 38* 37*  CREATININE 0.91  --   --  1.39* 1.75* 1.59* 1.57*  CALCIUM 9.8  --   --  8.9 8.9 8.6* 8.5*  MG 2.0  --   --   --  2.2  --   --   PHOS  --   --   --   --  5.3*  --   --    < > = values in this interval not displayed.   GFR: Estimated Creatinine Clearance: 25.9 mL/min (A) (by C-G formula based on SCr of 1.57 mg/dL (H)). Liver Function Tests: Recent Labs  Lab 11/23/20 1145 11/24/20 0811 11/25/20 0306 11/25/20 2022  AST 19 15 15   --   ALT 8 10 11   --   ALKPHOS 35* 29* 33*  --   BILITOT 0.4 0.4 0.6  --   PROT 7.6 6.6 6.4* 6.2*  ALBUMIN 4.4 3.5 3.7  --    No results for input(s): LIPASE, AMYLASE in the last 168 hours. No results for input(s): AMMONIA in the last 168 hours. Coagulation Profile: No results for input(s): INR, PROTIME in the last 168 hours. Cardiac Enzymes: No results for input(s): CKTOTAL, CKMB, CKMBINDEX, TROPONINI in the last 168 hours. BNP (last 3 results) No results for input(s): PROBNP in the last 8760 hours. HbA1C: No results for input(s): HGBA1C in the last 72 hours. CBG: Recent Labs  Lab 11/26/20 1643  GLUCAP 147*   Lipid Profile: No results for input(s): CHOL, HDL, LDLCALC, TRIG, CHOLHDL, LDLDIRECT in the last 72 hours. Thyroid Function Tests: Recent Labs     11/25/20 0955  TSH 2.871   Anemia Panel: No results for input(s): VITAMINB12, FOLATE, FERRITIN, TIBC, IRON, RETICCTPCT in the last 72 hours. Sepsis Labs: No results for input(s): PROCALCITON, LATICACIDVEN in the last 168 hours.  Recent Results (from the past 240 hour(s))  Resp Panel by RT-PCR (Flu A&B, Covid) Nasopharyngeal Swab     Status: None   Collection Time: 11/23/20 11:31 AM   Specimen: Nasopharyngeal Swab; Nasopharyngeal(NP) swabs in vial transport medium  Result Value Ref Range Status   SARS Coronavirus 2 by RT PCR NEGATIVE NEGATIVE Final    Comment: (NOTE) SARS-CoV-2 target nucleic acids are NOT DETECTED.  The SARS-CoV-2 RNA is generally detectable in upper respiratory specimens during the acute phase of infection. The lowest concentration of SARS-CoV-2 viral copies this assay can  detect is 138 copies/mL. A negative result does not preclude SARS-Cov-2 infection and should not be used as the sole basis for treatment or other patient management decisions. A negative result may occur with  improper specimen collection/handling, submission of specimen other than nasopharyngeal swab, presence of viral mutation(s) within the areas targeted by this assay, and inadequate number of viral copies(<138 copies/mL). A negative result must be combined with clinical observations, patient history, and epidemiological information. The expected result is Negative.  Fact Sheet for Patients:  EntrepreneurPulse.com.au  Fact Sheet for Healthcare Providers:  IncredibleEmployment.be  This test is no t yet approved or cleared by the [place] FDA and  has been authorized for detection and/or diagnosis of SARS-CoV-2 by FDA under an Emergency Use Authorization (EUA). This EUA will remain  in effect (meaning this test can be used) for the duration of the COVID-19 declaration under Section 564(b)(1) of the Act, 21 U.S.C.section 360bbb-3(b)(1), unless  the authorization is terminated  or revoked sooner.       Influenza A by PCR NEGATIVE NEGATIVE Final   Influenza B by PCR NEGATIVE NEGATIVE Final    Comment: (NOTE) The Xpert Xpress SARS-CoV-2/FLU/RSV plus assay is intended as an aid in the diagnosis of influenza from Nasopharyngeal swab specimens and should not be used as a sole basis for treatment. Nasal washings and aspirates are unacceptable for Xpert Xpress SARS-CoV-2/FLU/RSV testing.  Fact Sheet for Patients: EntrepreneurPulse.com.au  Fact Sheet for Healthcare Providers: IncredibleEmployment.be  This test is not yet approved or cleared by the [place] FDA and has been authorized for detection and/or diagnosis of SARS-CoV-2 by FDA under an Emergency Use Authorization (EUA). This EUA will remain in effect (meaning this test can be used) for the duration of the COVID-19 declaration under Section 564(b)(1) of the Act, 21 U.S.C. section 360bbb-3(b)(1), unless the authorization is terminated or revoked.  Performed at [facility], [address], [place], [place] [address]   MRSA Next Gen by PCR, Nasal     Status: None   Collection Time: 11/24/20  4:15 PM   Specimen: Nasal Mucosa; Nasal Swab  Result Value Ref Range Status   MRSA by PCR Next Gen NOT DETECTED NOT DETECTED Final    Comment: (NOTE) The GeneXpert MRSA Assay (FDA approved for NASAL specimens only), is one component of a comprehensive MRSA colonization surveillance program. It is not intended to diagnose MRSA infection nor to guide or monitor treatment for MRSA infections. Test performance is not FDA approved in patients less than 41 years old. Performed at [facility], [place] [address], [place], [place] [address]   Body fluid culture w Gram Stain     Status: None (Preliminary result)   Collection Time: 11/25/20  6:44 PM   Specimen: Pleura; Body Fluid  Result Value Ref Range Status    Specimen Description   Final    PLEURAL RIGHT Performed at [facility] [address], [place], [place] [address]    Special Requests   Final    NONE Performed at [facility], [place] [address], [place], [place] [address]    Gram Stain   Final    FEW WBC PRESENT, PREDOMINANTLY MONONUCLEAR NO ORGANISMS SEEN    Culture   Final    NO GROWTH 2 DAYS Performed at [facility] [address], [place],  [address]    Report Status PENDING  Incomplete         Radiology Studies: DG  CHEST PORT 1 VIEW  Result Date: 11/25/2020 CLINICAL DATA:  Thoracentesis EXAM: PORTABLE CHEST 1 VIEW COMPARISON:  11/23/2020 FINDINGS: Single frontal view of the chest demonstrates persistent enlargement of the cardiac silhouette. There are bilateral pleural effusions, decreased on the right after thoracentesis. No evidence of pneumothorax. No acute bony abnormalities. IMPRESSION: 1. Decreased right pleural effusion after thoracentesis. No evidence of complication. 2. Stable left pleural effusion. Electronically Signed   By: Randa Ngo M.D.   On: 11/25/2020 19:44        Scheduled Meds:  aspirin EC  81 mg Oral Daily   atenolol  50 mg Oral BID   furosemide  40 mg Oral Daily   gabapentin  100 mg Oral QHS   hydrALAZINE  50 mg Oral TID   mouth rinse  15 mL Mouth Rinse BID   melatonin  10 mg Oral QHS   methimazole  2.5 mg Oral Daily   pantoprazole  40 mg Oral Q0600   simvastatin  20 mg Oral QHS   sodium chloride flush  3 mL Intravenous Q12H   Continuous Infusions:  sodium chloride       LOS: 4 days    Time spent: 37 minutes spent on chart review, discussion with nursing staff, consultants, updating family and interview/physical exam; more than 50% of that time was spent in counseling and/or coordination of care.    Tanaiya Kolarik J [place] DO [facility] Available via Epic secure chat 7am-7pm After these hours, please refer to coverage  provider listed on [email or web address] 11/27/2020, 12:17 PM

## 2020-11-27 NOTE — Progress Notes (Signed)
Please see in addition to PT note:   SATURATION QUALIFICATIONS: (This note is used to comply with regulatory documentation for home oxygen)   Patient Saturations on Room Air at Rest = 70%   Patient Saturations on Room Air while Ambulating = DNT, rapid desat on room air at rest    Patient Saturations on 4 Liters of oxygen while Ambulating = 94%   Please briefly explain why patient needs home oxygen: rapid O2 desat on room air at rest and on 2LPM with activity. Required 4LPM to maintain Spo2 in target range today.   Windell Norfolk, DPT, PN2   Supplemental Physical Therapist [facility]    Pager [phone] Acute Rehab Office [phone]

## 2020-11-28 ENCOUNTER — Inpatient Hospital Stay (HOSPITAL_COMMUNITY): Payer: Medicare HMO

## 2020-11-28 DIAGNOSIS — N179 Acute kidney failure, unspecified: Secondary | ICD-10-CM | POA: Diagnosis not present

## 2020-11-28 DIAGNOSIS — I272 Pulmonary hypertension, unspecified: Secondary | ICD-10-CM

## 2020-11-28 DIAGNOSIS — J9601 Acute respiratory failure with hypoxia: Secondary | ICD-10-CM | POA: Diagnosis not present

## 2020-11-28 DIAGNOSIS — J9602 Acute respiratory failure with hypercapnia: Secondary | ICD-10-CM | POA: Diagnosis not present

## 2020-11-28 LAB — BASIC METABOLIC PANEL
Anion gap: 5 (ref 5–15)
BUN: 28 mg/dL — ABNORMAL HIGH (ref 8–23)
CO2: 35 mmol/L — ABNORMAL HIGH (ref 22–32)
Calcium: 8.3 mg/dL — ABNORMAL LOW (ref 8.9–10.3)
Chloride: 91 mmol/L — ABNORMAL LOW (ref 98–111)
Creatinine, Ser: 1.28 mg/dL — ABNORMAL HIGH (ref 0.44–1.00)
GFR, Estimated: 43 mL/min — ABNORMAL LOW (ref >60)
Glucose, Bld: 120 mg/dL — ABNORMAL HIGH (ref 70–99)
Potassium: 4.1 mmol/L (ref 3.5–5.1)
Sodium: 131 mmol/L — ABNORMAL LOW (ref 135–145)

## 2020-11-28 MED ORDER — ALUM & MAG HYDROXIDE-SIMETH 200-200-20 MG/5ML PO SUSP
30.0000 mL | ORAL | Status: DC | PRN
  Administered 2020-11-28 – 2020-12-04 (×3): 30 mL via ORAL
  Filled 2020-11-28 (×3): qty 30

## 2020-11-28 NOTE — Consult Note (Signed)
Cardiology Consultation:   Patient ID: Mandy [name] MRN: [id number]; DOB: 28-Jul-1940  Admit date: 11/23/2020 Date of Consult: 11/28/2020  PCP:  [name], MD   [facility] Providers Cardiologist:  None        Patient Profile:   [name] is a 80 y.o. 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  History of Present Illness:   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  She had a recent hospitalization for pleural effusion and underwent thoracentesis at that time.  She was seen as an outpatient by Dr. Kipp Brood and cardiothoracic surgery and chest x-ray showed enlarging right-sided pleural effusion and she was referred to pulmonology for thoracentesis.  However she had presented to the ED with worsening shortness of breath 11/21.  In the ED, she was found to have acute on chronic respiratory failure with hypoxia and hypercapnia.  She initially required BiPAP.  Labs notable for creatinine 0.9, BNP 415.  CTPA showed no PE but moderate sized right pleural effusion.  She has had recurrent effusion since her anterior mediastinal mass resection in September.  She underwent right thoracentesis on 11/25/2020, 250 cc were drained.  She was initially diuresed with IV Lasix but creatinine bumped and she was switched to p.o. Lasix.  Echocardiogram 11/24/2020 showed EF 55 to 60%, mild RV dysfunction, severe pulmonary hypertension (RVSP 83  mmHg), biatrial enlargement, mild to moderate MR, IVC fixed/dilated.  Cardiology was consulted for consideration of right heart catheterization.  She reports her dyspnea has improved.  Does report intermittent chest pain.   Past Medical History:  Diagnosis Date   Aortic atherosclerosis ([place]) 10/08/2018   Arthritis    Diabetes mellitus without complication (HCC)    GERD (gastroesophageal reflux disease)    Hepatic steatosis 10/08/2018   Hyperlipidemia    Hypertension    Hyperthyroidism    Mass of right ovary 10/08/2018   Osteoporosis    Pneumonia    SBO (small bowel obstruction) ([place]) 08/09/2018   SBO (small bowel obstruction) ([place]) 08/09/2018    Past Surgical History:  Procedure Laterality Date   CATARACT EXTRACTION W/ INTRAOCULAR LENS  IMPLANT, BILATERAL     COLONOSCOPY        Inpatient Medications: Scheduled Meds:  aspirin EC  81 mg Oral Daily   atenolol  50 mg Oral BID   furosemide  40 mg Oral Daily   gabapentin  100 mg Oral QHS   hydrALAZINE  50 mg Oral TID   mouth rinse  15 mL Mouth Rinse BID   melatonin  10 mg Oral QHS   methimazole  2.5 mg Oral Daily   pantoprazole  40 mg Oral Q0600   simvastatin  20 mg Oral QHS   sodium chloride flush  3 mL Intravenous Q12H   Continuous Infusions:  sodium chloride     PRN Meds: sodium chloride, acetaminophen **OR** acetaminophen, albuterol, diazepam, hydrALAZINE, senna-docusate, sodium chloride flush  Allergies:   No Known Allergies  Social History:   Social History   Socioeconomic History   Marital status: Widowed    Spouse name: Not on file  Number of children: Not on file   Years of education: Not on file   Highest education level: Not on file  Occupational History   Not on file  Tobacco Use   Smoking status: Never   Smokeless tobacco: Never  Vaping Use   Vaping Use: Never used  Substance and Sexual Activity   Alcohol use: No   Drug use: No   Sexual activity: Not on file  Other Topics Concern   Not on  file  Social History Narrative   Patient is from [place]   Speaks [place]   Social Determinants of Health   Financial Resource Strain: Not on file  Food Insecurity: Not on file  Transportation Needs: Not on file  Physical Activity: Not on file  Stress: Not on file  Social Connections: Not on file  Intimate Partner Violence: Not on file    Family History:    Family History  Problem Relation Age of Onset   Kidney disease Son    Colon cancer Neg Hx    Breast cancer Neg Hx      ROS:  Please see the history of present illness.   All other ROS reviewed and negative.     Physical Exam/Data:   Vitals:   11/28/20 0400 11/28/20 0500 11/28/20 0600 11/28/20 0627  BP:    (!) 141/63  Pulse:    77  Resp: (!) 26 (!) 27 17   Temp:    98.2 F (36.8 C)  TempSrc:    Oral  SpO2:    100%  Weight:  78.2 kg    Height:        Intake/Output Summary (Last 24 hours) at 11/28/2020 0852 Last data filed at 11/28/2020 0700 Gross per 24 hour  Intake 460 ml  Output 650 ml  Net -190 ml   Last 3 Weights 11/28/2020 11/26/2020 11/25/2020  Weight (lbs) 172 lb 4.8 oz 167 lb 12.3 oz 169 lb 12.1 oz  Weight (kg) 78.155 kg 76.1 kg 77 kg     Body mass index is 34.8 kg/m.  General:  in no acute distress HEENT: normal Neck: +JVD Cardiac:  normal S1, S2; RRR; no murmur  Lungs: Bibasilar crackles Abd: soft, nontender Ext: no edema Musculoskeletal:  No deformities, BUE and BLE strength normal and equal Skin: warm and dry  Neuro:  no focal abnormalities noted Psych:  Normal affect   EKG:  The EKG from 11/21 was personally reviewed and demonstrates: Normal sinus rhythm, rate 80, poor R wave progression, no ST abnormalities Telemetry:  Telemetry was personally reviewed and demonstrates:  NSR  Relevant CV Studies:  1. Left ventricular ejection fraction, by estimation, is 55 to 60%. The  left ventricle has normal function. There is mild left ventricular  hypertrophy.   2. Right ventricular  systolic function is mildly reduced. The right  ventricular size is normal. There is severely elevated pulmonary artery  systolic pressure. The estimated right ventricular systolic pressure is  10.9 mmHg.   3. Left atrial size was mild to moderately dilated.   4. Right atrial size was mildly dilated.   5. The mitral valve is normal in structure. Mild to moderate mitral valve  regurgitation. No evidence of mitral stenosis.   6. The aortic valve is tricuspid. Aortic valve regurgitation is not  visualized. No aortic stenosis is present.   7. The inferior vena cava is dilated in size with <50% respiratory  variability, suggesting right atrial pressure of 15 mmHg.   8.  Left pleural effusion noted.   Laboratory Data:  High Sensitivity Troponin:   Recent Labs  Lab 11/03/20 1241 11/03/20 1843  TROPONINIHS 35* 37*     Chemistry Recent Labs  Lab 11/23/20 1145 11/23/20 1150 11/25/20 0306 11/26/20 0405 11/27/20 0334 11/28/20 0402  NA 134*   < > 135 136 135 131*  K 4.0   < > 3.9 3.8 3.9 4.1  CL 92*   < > 92* 93* 92* 91*  CO2 35*   < > 35* 36* 37* 35*  GLUCOSE 125*   < > 103* 112* 110* 120*  BUN 9   < > 36* 38* 37* 28*  CREATININE 0.91   < > 1.75* 1.59* 1.57* 1.28*  CALCIUM 9.8   < > 8.9 8.6* 8.5* 8.3*  MG 2.0  --  2.2  --   --   --   GFRNONAA >60   < > 29* 33* 33* 43*  ANIONGAP 7   < > 8 7 6 5    < > = values in this interval not displayed.    Recent Labs  Lab 11/23/20 1145 11/24/20 0811 11/25/20 0306 11/25/20 2022  PROT 7.6 6.6 6.4* 6.2*  ALBUMIN 4.4 3.5 3.7  --   AST 19 15 15   --   ALT 8 10 11   --   ALKPHOS 35* 29* 33*  --   BILITOT 0.4 0.4 0.6  --    Lipids No results for input(s): CHOL, TRIG, HDL, LABVLDL, LDLCALC, CHOLHDL in the last 168 hours.  Hematology Recent Labs  Lab 11/24/20 0811 11/25/20 0306 11/26/20 0405  WBC 4.0 7.3 6.0  RBC 3.35* 3.34* 3.33*  HGB 8.8* 8.8* 8.7*  HCT 28.1* 28.0* 28.0*  MCV 83.9 83.8 84.1  MCH 26.3 26.3 26.1  MCHC 31.3 31.4 31.1   RDW 16.0* 16.2* 16.0*  PLT 288 285 288   Thyroid  Recent Labs  Lab 11/25/20 0955  TSH 2.871    BNP Recent Labs  Lab 11/23/20 1145  BNP 414.8*    DDimer No results for input(s): DDIMER in the last 168 hours.   Radiology/Studies:  [place] CHEST (PLEURAL EFFUSION)  Result Date: 11/24/2020 CLINICAL DATA:  Right pleural effusion, assess volume for thoracentesis EXAM: CHEST ULTRASOUND COMPARISON:  11/23/2020 FINDINGS: Ultrasound performed of the right posterior chest with the patient upright. Small non loculated right pleural effusion noted, not enough to warrant therapeutic thoracentesis. Procedure not performed. IMPRESSION: Small right effusion by ultrasound. Electronically Signed   By: Jerilynn Mages.  Shick M.D.   On: 11/24/2020 14:58   DG CHEST PORT 1 VIEW  Result Date: 11/25/2020 CLINICAL DATA:  Thoracentesis EXAM: PORTABLE CHEST 1 VIEW COMPARISON:  11/23/2020 FINDINGS: Single frontal view of the chest demonstrates persistent enlargement of the cardiac silhouette. There are bilateral pleural effusions, decreased on the right after thoracentesis. No evidence of pneumothorax. No acute bony abnormalities. IMPRESSION: 1. Decreased right pleural effusion after thoracentesis. No evidence of complication. 2. Stable left pleural effusion. Electronically Signed   By: Randa Ngo M.D.   On: 11/25/2020 19:44   ECHOCARDIOGRAM LIMITED  Result Date: 11/24/2020    ECHOCARDIOGRAM LIMITED REPORT   Patient Name:   YVETTA DROTAR Date of Exam: 11/24/2020 Medical Rec #:  [id number]      Height:       59.0 in Accession #:    [id number]     Weight:       168.9 lb Date of Birth:  November 28, 1940       BSA:  1.717 m Patient Age:    4 years       BP:           171/57 mmHg Patient Gender: F              HR:           70 bpm. Exam Location:  Inpatient Procedure: 3D Echo, Limited Echo, Cardiac Doppler and Color Doppler Indications:    I50.40* Unspecified combined systolic (congestive) and diastolic                  (congestive) heart failure  History:        Patient has prior history of Echocardiogram examinations, most                 recent 11/04/2020. Signs/Symptoms:Shortness of Breath and                 Dyspnea; Risk Factors:Diabetes and Dyslipidemia.  Sonographer:    Roseanna Rainbow RDCS Referring Phys: [id number] [place]  1. Left ventricular ejection fraction, by estimation, is 55 to 60%. The left ventricle has normal function. There is mild left ventricular hypertrophy.  2. Right ventricular systolic function is mildly reduced. The right ventricular size is normal. There is severely elevated pulmonary artery systolic pressure. The estimated right ventricular systolic pressure is 58.5 mmHg.  3. Left atrial size was mild to moderately dilated.  4. Right atrial size was mildly dilated.  5. The mitral valve is normal in structure. Mild to moderate mitral valve regurgitation. No evidence of mitral stenosis.  6. The aortic valve is tricuspid. Aortic valve regurgitation is not visualized. No aortic stenosis is present.  7. The inferior vena cava is dilated in size with <50% respiratory variability, suggesting right atrial pressure of 15 mmHg.  8. Left pleural effusion noted. FINDINGS  Left Ventricle: Left ventricular ejection fraction, by estimation, is 55 to 60%. The left ventricle has normal function. The left ventricular internal cavity size was normal in size. There is mild left ventricular hypertrophy. Right Ventricle: The right ventricular size is normal. Right ventricular systolic function is mildly reduced. There is severely elevated pulmonary artery systolic pressure. The tricuspid regurgitant velocity is 4.13 m/s, and with an assumed right atrial pressure of 15 mmHg, the estimated right ventricular systolic pressure is 27.7 mmHg. Left Atrium: Left atrial size was mild to moderately dilated. Right Atrium: Right atrial size was mildly dilated. Pericardium: Left pleural effusion noted. Trivial pericardial  effusion is present. Mitral Valve: The mitral valve is normal in structure. Mild to moderate mitral valve regurgitation. No evidence of mitral valve stenosis. Tricuspid Valve: Tricuspid valve regurgitation is mild. Aortic Valve: The aortic valve is tricuspid. Aortic valve regurgitation is not visualized. No aortic stenosis is present. Aorta: The aortic root is normal in size and structure. Venous: The inferior vena cava is dilated in size with less than 50% respiratory variability, suggesting right atrial pressure of 15 mmHg. IAS/Shunts: No atrial level shunt detected by color flow Doppler. LEFT VENTRICLE PLAX 2D LVIDd:         4.50 cm LVIDs:         2.80 cm LV PW:         1.10 cm LV IVS:        1.20 cm LVOT diam:     2.20 cm     3D Volume EF: LVOT Area:     3.80 cm    3D EF:  67 %                            LV EDV:       90 ml                            LV ESV:       30 ml LV Volumes (MOD)           LV SV:        60 ml LV vol d, MOD A2C: 68.0 ml LV vol d, MOD A4C: 78.4 ml LV vol s, MOD A2C: 29.3 ml LV vol s, MOD A4C: 30.4 ml LV SV MOD A2C:     38.7 ml LV SV MOD A4C:     78.4 ml LV SV MOD BP:      46.0 ml IVC IVC diam: 2.40 cm LEFT ATRIUM         Index LA diam:    3.60 cm 2.10 cm/m   AORTA Ao Root diam: 3.20 cm Ao Asc diam:  3.50 cm MITRAL VALVE                  TRICUSPID VALVE MV Area (PHT): 3.17 cm       TR Peak grad:   68.2 mmHg MV Decel Time: 239 msec       TR Vmax:        413.00 cm/s MR Peak grad:    110.7 mmHg MR Mean grad:    80.0 mmHg    SHUNTS MR Vmax:         526.00 cm/s  Systemic Diam: 2.20 cm MR Vmean:        427.0 cm/s MR PISA:         1.01 cm MR PISA Eff ROA: 7 mm MR PISA Radius:  0.40 cm MV E velocity: 117.00 cm/s MV A velocity: 95.50 cm/s MV E/A ratio:  1.23 Dalton McleanMD Electronically signed by Franki Monte Signature Date/Time: 11/24/2020/5:48:09 PM    Final      Assessment and Plan:   Severe pulmonary hypertension:  Echocardiogram 11/24/2020 showed EF 55 to 60%, mild RV  dysfunction, severe pulmonary hypertension (RVSP 83 mmHg), biatrial enlargement, mild to moderate MR, IVC fixed/dilated.  Likely group 2 and group 3, with her chronic pulmonary disease and diastolic heart failure contributing. -Plan RHC for further evaluation, likely Monday.  Spoke with her daughter, she is going to come in tomorrow morning and I will talk more with patient and daughter about procedure  Chronic diastolic heart failure: Worsening renal function with IV Lasix, switched to p.o. Lasix 40 mg daily with improvement in renal function.  Continue PO lasix  Acute hypoxic in hypercapnic respiratory failure: Initially required BiPAP.  Improving.  CTPA showed no PE, moderate size right pleural effusion.  Pulmonology following.  Suspect component of OHS/OSA.  Recurrent pleural effusion: Underwent thoracentesis on 11/23.  AKI: Creatinine increased from 0.9 up to 1.75.  Improving with holding IV Lasix, down to 1.28 today  For questions or updates, please contact [place] Please consult [email or web address] for contact info under    Signed, Donato Heinz, MD  11/28/2020 8:52 AM

## 2020-11-28 NOTE — Progress Notes (Signed)
Pt currently refusing BIPAP QHS.

## 2020-11-28 NOTE — Progress Notes (Signed)
RN changing lead; interference noted

## 2020-11-28 NOTE — Progress Notes (Signed)
OT Cancellation Note  Patient Details Name: Mandy [name] MRN: [id number] DOB: 1940/02/10   Cancelled Treatment:    Reason Eval/Treat Not Completed: Medical issues which prohibited therapy (Per RN, pt wit h recent hypoxic episode (SpO2 dropping to 32% on RA - pt having doffed her n/c). Will hold and return as schedule allows.)  [place], OTR/L Acute Rehab Pager: [phone] Office: [phone] 11/28/2020, 11:44 AM

## 2020-11-28 NOTE — Progress Notes (Signed)
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  Patient with history of pleural effusion with recent hospitalization and underwent thoracentesis at that time.  Patient was evaluated by cardiothoracic surgery, Dr. Kipp Brood last Friday and x-ray notable for enlarging right-sided pleural effusion and was referred to pulmonology, Dr. Valeta Harms for thoracentesis.  Due to her continued progressing shortness of breath, patient presented to ED for further evaluation.  Patient typically utilizes 2 L nasal cannula at baseline but had increased to 4 L morning of hospital presentation.  Also with reported orthopnea and requiring increased inclination for sleep.  Patient's daughter did give her an extra dose of Lasix night prior to ED presentation without any significant effect.  Denies any recent travel, or changes in diet.  No sick contacts.  In the ED, patient was found to have acute on chronic respiratory failure with hypercapnia and hypoxia.  Patient was placed on BiPAP.  BNP elevated 414.8.  Sodium 134, potassium 4.0, chloride 92, bicarb 35, creatinine 0.91.  Glucose 125.  Magnesium 2.0.  LFTs within normal limits.  Hospitalist consulted for further evaluation management of acute on chronic hypoxic/hypercapnic respiratory failure with recurrent right-sided pleural effusion.   Assessment & Plan:   Principal Problem:   Acute respiratory failure with hypoxia and hypercapnia (HCC) Active Problems:   Diabetes mellitus type 2, controlled (HCC)   Dyslipidemia   Essential hypertension   Hyponatremia    Hyperthyroidism   Pleural effusion on right   AKI (acute kidney injury) ([place])   Anemia   Acute on chronic heart failure with preserved ejection fraction (HFpEF) (HCC)   Chronic respiratory failure with hypoxia, on home oxygen therapy ([place])   Anxiety   Severe pulmonary hypertension (HCC)   Acute on chronic hypoxic/hypercapnic respiratory failure, POA Patient presenting with progressive shortness of breath and found to be hypoxic and hypercapnic with PaCO2 85.0.  Patient initially started on BiPAP and slowly weaned off.  CT angiogram chest with no definite evidence of pulm embolism, moderate size right pleural effusion with associated ectasis right lower lobe, etiology likely secondary to recurrent pleural effusion. 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  Patient has had recurrent effusions since and required thoracentesis on 11/04/2020.  Was evaluated by interventional radiology on 11/24/2020 and felt that pocket was too small for thoracentesis.  Patient underwent right thoracentesis by pulmonology on 11/25/2020 with removal of 250 mL of amber appearing fluid. --PCCM following, appreciate assistance --Pleural fluid culture: No growth x 3 days --Cytology: Pending --Repeat chest x-ray in the a.m.  Acute on chronic diastolic congestive heart failure BNP elevated 414.8 with recurrent right pleural effusion as above.  Patient was initially started on IV diuresis, but creatinine has increased.  TTE with LVEF 55-60%, mild LVH, RV systolic function mildly reduced, elevated RVSP 83.2, biatrial enlargement, IVC dilated. --Continue Lasix 40 mg p.o. daily --Strict I's and O's and daily weights --BMP  daily  Severe pulmonary hypertension TTE 11/22 with severely elevated RVSP of 83, IVC dilation, biatrial enlargement. --Cardiology following, plan right heart catheterization likely Monday  Acute renal failure, not POA Etiology likely secondary to overdiuresis with IV furosemide. --Cr 0.91>1.39>1.75>1.59>1.57>1.28 --Discontinued IV furosemide, transitioned back to oral Lasix --BMP daily  Essential hypertension --Atenolol 50 mg p.o. twice daily --Furosemide 40 mg p.o. daily --Hydralazine 50 mg p.o. 3 times daily --Continue aspirin and statin  Anemia, normocytic --Hgb 11.6>8.8>8.7 --MCV 83.8 --CBC daily  Hyponatremia Etiology likely secondary to hypervolemic hyponatremia in the setting of volume overload from underlying diastolic CHF. --BMP daily  Type 2 diabetes mellitus Hemoglobin A1c 5.9 on 11/03/2020, well controlled.  Diet controlled at home.  GERD: Protonix 40 mg p.o. daily  Hx Hyperthyroidism TSH 2.871 11/25/2020. --Methimazole 2.5 mg p.o. daily  Neuropathy: Gabapentin 100 mg p.o. nightly  Anxiety: Diazepam 2 mg q12h PRN  Dyslipidemia --Simvastatin 20 mg p.o. nightly  Weakness/deconditioning: --PT currently recommending home health PT but may need to consider SNF if progress low --OT consult: Pending  DVT prophylaxis: Place and maintain sequential compression device Start: 11/23/20 1957   Code Status: Full Code Family Communication: No family present at bedside this morning or this afternoon  Disposition Plan:  Level of care: Telemetry Status is: Inpatient  Remains inpatient appropriate because: Continue monitoring of renal function and respiratory status, will need cardiology evaluation for consideration of right/left heart catheterization for severe pulmonary hypertension    Consultants:  Interventional radiology PCCM  Procedures:  Right thoracentesis 11/23, Dr. Verlee Monte PCCM  Antimicrobials:  None   Subjective: Patient seen examined bedside,  resting comfortably.  No family present.  Lying in bed.  No complaints.  Seen by cardiology this morning with recommendation of right heart catheterization.  Awaiting to obtain consent from daughter. No other questions or concerns at this time.  Denies headache, no chest pain, no palpitations, no abdominal pain.  No acute events overnight per nursing staff.  Received page regarding rapid response for hypoxia, apparently patient took off her oxygen with desaturation to 32% on room air with subsequent chest pain.  EKG with no concerning dynamic changes.  Oxygen replaced and recovered quickly.  No other concerns per nursing.  Objective: Vitals:   11/28/20 0913 11/28/20 1127 11/28/20 1234 11/28/20 1234  BP: (!) 147/53 139/63 (!) 145/57 (!) 145/57  Pulse: 78 75 79 79  Resp:   17 17  Temp:  98.9 F (37.2 C) 98.7 F (37.1 C) 98.7 F (37.1 C)  TempSrc:  Oral Oral Oral  SpO2:  100% 100% 100%  Weight:      Height:        Intake/Output Summary (Last 24 hours) at 11/28/2020 1438 Last data filed at 11/28/2020 0700 Gross per 24 hour  Intake 240 ml  Output --  Net 240 ml   Filed Weights   11/25/20 0500 11/26/20 0500 11/28/20 0500  Weight: 77 kg 76.1 kg 78.2 kg    Examination:  General exam: Appears calm and comfortable  Respiratory system: Breath sounds clear to auscultation bilaterally, mild left-sided inspiratory wheezing, no crackles, normal respiratory effort, on 4 L nasal cannula at rest (baseline 2-4L [place]) Cardiovascular system: S1 & S2 heard, RRR. No JVD, murmurs, rubs, gallops or clicks. No pedal edema. Gastrointestinal system: Abdomen is nondistended, soft and nontender. No organomegaly or masses felt. Normal bowel sounds heard. Central nervous system: Alert and oriented. No focal neurological deficits. Extremities: Symmetric 5 x 5 power. Skin: No rashes, lesions or ulcers Psychiatry:  Judgement and insight appear normal. Mood & affect appropriate.     Data Reviewed: I have  personally reviewed following labs and imaging studies  CBC: Recent Labs  Lab 11/23/20 1145 11/23/20 1150 11/23/20 1317 11/24/20 0811 11/25/20 0306 11/26/20 0405  WBC 5.5  --   --  4.0 7.3 6.0  NEUTROABS 3.7  --   --  2.9 4.9  --   HGB 9.9* 11.6* 11.6* 8.8* 8.8* 8.7*  HCT 31.4* 34.0* 34.0* 28.1* 28.0* 28.0*  MCV 81.8  --   --  83.9 83.8 84.1  PLT 318  --   --  288 285 536   Basic Metabolic Panel: Recent Labs  Lab 11/23/20 1145 11/23/20 1150 11/24/20 0811 11/25/20 0306 11/26/20 0405 11/27/20 0334 11/28/20 0402  NA 134*   < > 137 135 136 135 131*  K 4.0   < > 4.2 3.9 3.8 3.9 4.1  CL 92*  --  94* 92* 93* 92* 91*  CO2 35*  --  35* 35* 36* 37* 35*  GLUCOSE 125*  --  117* 103* 112* 110* 120*  BUN 9  --  24* 36* 38* 37* 28*  CREATININE 0.91  --  1.39* 1.75* 1.59* 1.57* 1.28*  CALCIUM 9.8  --  8.9 8.9 8.6* 8.5* 8.3*  MG 2.0  --   --  2.2  --   --   --   PHOS  --   --   --  5.3*  --   --   --    < > = values in this interval not displayed.   GFR: Estimated Creatinine Clearance: 32.2 mL/min (A) (by C-G formula based on SCr of 1.28 mg/dL (H)). Liver Function Tests: Recent Labs  Lab 11/23/20 1145 11/24/20 0811 11/25/20 0306 11/25/20 2022  AST 19 15 15   --   ALT 8 10 11   --   ALKPHOS 35* 29* 33*  --   BILITOT 0.4 0.4 0.6  --   PROT 7.6 6.6 6.4* 6.2*  ALBUMIN 4.4 3.5 3.7  --    No results for input(s): LIPASE, AMYLASE in the last 168 hours. No results for input(s): AMMONIA in the last 168 hours. Coagulation Profile: No results for input(s): INR, PROTIME in the last 168 hours. Cardiac Enzymes: No results for input(s): CKTOTAL, CKMB, CKMBINDEX, TROPONINI in the last 168 hours. BNP (last 3 results) No results for input(s): PROBNP in the last 8760 hours. HbA1C: No results for input(s): HGBA1C in the last 72 hours. CBG: Recent Labs  Lab 11/26/20 1643  GLUCAP 147*   Lipid Profile: No results for input(s): CHOL, HDL, LDLCALC, TRIG, CHOLHDL, LDLDIRECT in the last 72  hours. Thyroid Function Tests: No results for input(s): TSH, T4TOTAL, FREET4, T3FREE, THYROIDAB in the last 72 hours.  Anemia Panel: No results for input(s): VITAMINB12, FOLATE, FERRITIN, TIBC, IRON, RETICCTPCT in the last 72 hours. Sepsis Labs: No results for input(s): PROCALCITON, LATICACIDVEN in the last 168 hours.  Recent Results (from the past 240 hour(s))  Resp Panel by RT-PCR (Flu A&B, Covid) Nasopharyngeal Swab     Status: None   Collection Time: 11/23/20 11:31 AM   Specimen: Nasopharyngeal Swab; Nasopharyngeal(NP) swabs in vial transport medium  Result Value Ref Range Status   SARS Coronavirus 2 by RT PCR NEGATIVE NEGATIVE Final    Comment: (NOTE) SARS-CoV-2 target nucleic acids are NOT DETECTED.  The SARS-CoV-2 RNA is generally detectable in upper respiratory specimens during the acute phase of infection. The lowest concentration of SARS-CoV-2  viral copies this assay can detect is 138 copies/mL. A negative result does not preclude SARS-Cov-2 infection and should not be used as the sole basis for treatment or other patient management decisions. A negative result may occur with  improper specimen collection/handling, submission of specimen other than nasopharyngeal swab, presence of viral mutation(s) within the areas targeted by this assay, and inadequate number of viral copies(<138 copies/mL). A negative result must be combined with clinical observations, patient history, and epidemiological information. The expected result is Negative.  Fact Sheet for Patients:  EntrepreneurPulse.com.au  Fact Sheet for Healthcare Providers:  IncredibleEmployment.be  This test is no t yet approved or cleared by the [place] FDA and  has been authorized for detection and/or diagnosis of SARS-CoV-2 by FDA under an Emergency Use Authorization (EUA). This EUA will remain  in effect (meaning this test can be used) for the duration of the COVID-19  declaration under Section 564(b)(1) of the Act, 21 U.S.C.section 360bbb-3(b)(1), unless the authorization is terminated  or revoked sooner.       Influenza A by PCR NEGATIVE NEGATIVE Final   Influenza B by PCR NEGATIVE NEGATIVE Final    Comment: (NOTE) The Xpert Xpress SARS-CoV-2/FLU/RSV plus assay is intended as an aid in the diagnosis of influenza from Nasopharyngeal swab specimens and should not be used as a sole basis for treatment. Nasal washings and aspirates are unacceptable for Xpert Xpress SARS-CoV-2/FLU/RSV testing.  Fact Sheet for Patients: EntrepreneurPulse.com.au  Fact Sheet for Healthcare Providers: IncredibleEmployment.be  This test is not yet approved or cleared by the [place] FDA and has been authorized for detection and/or diagnosis of SARS-CoV-2 by FDA under an Emergency Use Authorization (EUA). This EUA will remain in effect (meaning this test can be used) for the duration of the COVID-19 declaration under Section 564(b)(1) of the Act, 21 U.S.C. section 360bbb-3(b)(1), unless the authorization is terminated or revoked.  Performed at [facility], [address] [place], [place] [address]   MRSA Next Gen by PCR, Nasal     Status: None   Collection Time: 11/24/20  4:15 PM   Specimen: Nasal Mucosa; Nasal Swab  Result Value Ref Range Status   MRSA by PCR Next Gen NOT DETECTED NOT DETECTED Final    Comment: (NOTE) The GeneXpert MRSA Assay (FDA approved for NASAL specimens only), is one component of a comprehensive MRSA colonization surveillance program. It is not intended to diagnose MRSA infection nor to guide or monitor treatment for MRSA infections. Test performance is not FDA approved in patients less than 50 years old. Performed at [facility], [place] [address], [place], [place] [address]   Body fluid culture w Gram Stain     Status: None (Preliminary result)   Collection  Time: 11/25/20  6:44 PM   Specimen: Pleura; Body Fluid  Result Value Ref Range Status   Specimen Description   Final    PLEURAL RIGHT Performed at [facility] [address], [place], [place] [address]    Special Requests   Final    NONE Performed at [facility], [place] [address], [place], [place] [address]    Gram Stain   Final    FEW WBC PRESENT, PREDOMINANTLY MONONUCLEAR NO ORGANISMS SEEN    Culture   Final    NO GROWTH 3 DAYS Performed at [facility] [address] [place], [place] [address]    Report Status PENDING  Incomplete  Radiology Studies: No results found.      Scheduled Meds:  aspirin EC  81 mg Oral Daily   atenolol  50 mg Oral BID   furosemide  40 mg Oral Daily   gabapentin  100 mg Oral QHS   hydrALAZINE  50 mg Oral TID   mouth rinse  15 mL Mouth Rinse BID   melatonin  10 mg Oral QHS   methimazole  2.5 mg Oral Daily   pantoprazole  40 mg Oral Q0600   simvastatin  20 mg Oral QHS   sodium chloride flush  3 mL Intravenous Q12H   Continuous Infusions:  sodium chloride       LOS: 5 days    Time spent: 37 minutes spent on chart review, discussion with nursing staff, consultants, updating family and interview/physical exam; more than 50% of that time was spent in counseling and/or coordination of care.    Pattijo Juste J [place] DO [facility] Available via Epic secure chat 7am-7pm After these hours, please refer to coverage provider listed on [email or web address] 11/28/2020, 2:38 PM

## 2020-11-28 NOTE — Progress Notes (Signed)
Upon entering the room, patient appeared diaphoretic, with complaints of chest pain and SOB. Patient continued to get more lethargic as time progressed. At this time, the charge nurse and rapid response were called for advisement. Vital signs were checked and patient O2 was 32% on room air. After realizing that patient removed nasal cannula, nasal cannula was re-applied and O2 was increased to 6 L to improve O2. After 1 minute, patient O2 saturation increased to 100%. MD has been notified. No new orders at this time. Patient is currently resting with no complaints of SOB and chest pain. Patient can be forgetful about instructions. Will continue to advise and monitor. Pt. O2 will be weaned back to 4 L shortly.  Most recent vital signs with 6 L O2 are as follows:    11/28/20 1127  Vitals  Temp 98.9 F (37.2 C)  Temp Source Oral  BP 139/63  MAP (mmHg) 84  BP Method Automatic  Pulse Rate 75  Pulse Rate Source Monitor  MEWS COLOR  MEWS Score Color Green  Oxygen Therapy  SpO2 100 %  O2 Device Nasal Cannula  O2 Flow Rate (L/min) 6 L/min  MEWS Score  MEWS Temp 0  MEWS Systolic 0  MEWS Pulse 0  MEWS RR 0  MEWS LOC 0  MEWS Score 0   Layla Maw, RN

## 2020-11-29 DIAGNOSIS — R079 Chest pain, unspecified: Secondary | ICD-10-CM | POA: Diagnosis not present

## 2020-11-29 DIAGNOSIS — I272 Pulmonary hypertension, unspecified: Secondary | ICD-10-CM | POA: Diagnosis not present

## 2020-11-29 DIAGNOSIS — I5033 Acute on chronic diastolic (congestive) heart failure: Secondary | ICD-10-CM

## 2020-11-29 DIAGNOSIS — J9602 Acute respiratory failure with hypercapnia: Secondary | ICD-10-CM | POA: Diagnosis not present

## 2020-11-29 DIAGNOSIS — J9601 Acute respiratory failure with hypoxia: Secondary | ICD-10-CM | POA: Diagnosis not present

## 2020-11-29 LAB — BASIC METABOLIC PANEL
Anion gap: 4 — ABNORMAL LOW (ref 5–15)
BUN: 23 mg/dL (ref 8–23)
CO2: 40 mmol/L — ABNORMAL HIGH (ref 22–32)
Calcium: 8.7 mg/dL — ABNORMAL LOW (ref 8.9–10.3)
Chloride: 92 mmol/L — ABNORMAL LOW (ref 98–111)
Creatinine, Ser: 0.99 mg/dL (ref 0.44–1.00)
GFR, Estimated: 58 mL/min — ABNORMAL LOW (ref >60)
Glucose, Bld: 108 mg/dL — ABNORMAL HIGH (ref 70–99)
Potassium: 4.3 mmol/L (ref 3.5–5.1)
Sodium: 136 mmol/L (ref 135–145)

## 2020-11-29 LAB — TROPONIN I (HIGH SENSITIVITY)
Troponin I (High Sensitivity): 42 ng/L — ABNORMAL HIGH (ref <18)
Troponin I (High Sensitivity): 41 ng/L — ABNORMAL HIGH (ref <18)

## 2020-11-29 LAB — BODY FLUID CULTURE W GRAM STAIN: Culture: NO GROWTH

## 2020-11-29 MED ORDER — LIP MEDEX EX OINT
TOPICAL_OINTMENT | CUTANEOUS | Status: DC | PRN
  Filled 2020-11-29: qty 7

## 2020-11-29 MED ORDER — NITROGLYCERIN 0.4 MG SL SUBL
0.4000 mg | SUBLINGUAL_TABLET | SUBLINGUAL | Status: DC | PRN
  Administered 2020-11-29 (×2): 0.4 mg via SUBLINGUAL
  Filled 2020-11-29 (×2): qty 1

## 2020-11-29 MED ORDER — SODIUM CHLORIDE 0.9 % IV SOLN: INTRAVENOUS | Status: DC

## 2020-11-29 MED ORDER — SODIUM CHLORIDE 0.9% FLUSH: 3.0000 mL | INTRAVENOUS | Status: DC | PRN

## 2020-11-29 MED ORDER — SODIUM CHLORIDE 0.9% FLUSH
3.0000 mL | Freq: Two times a day (BID) | INTRAVENOUS | Status: DC
  Administered 2020-11-29 – 2020-12-08 (×15): 3 mL via INTRAVENOUS

## 2020-11-29 MED ORDER — SODIUM CHLORIDE 0.9 % IV SOLN: 250.0000 mL | INTRAVENOUS | Status: DC | PRN

## 2020-11-29 MED ORDER — ASPIRIN 81 MG PO CHEW
81.0000 mg | CHEWABLE_TABLET | ORAL | Status: AC
  Administered 2020-11-30: 09:00:00 81 mg via ORAL

## 2020-11-29 MED ORDER — FUROSEMIDE 40 MG PO TABS
60.0000 mg | ORAL_TABLET | Freq: Every day | ORAL | Status: DC
  Administered 2020-11-29 – 2020-12-04 (×6): 60 mg via ORAL
  Filled 2020-11-29 (×6): qty 1

## 2020-11-29 NOTE — Progress Notes (Signed)
Patient has spent approximately 1 hour and 45 minutes on BiPAP. Patient breathing effort has improved. Will continue to monitor.  Layla Maw, RN

## 2020-11-29 NOTE — Evaluation (Signed)
Occupational Therapy Evaluation Patient Details Name: Mandy [name] MRN: [id number] DOB: March 22, 1940 Today's Date: 11/29/2020   History of Present Illness 80yo female who presented on 11/21 with increased SOB and DOE, difficulty maintaining SpO2 in the 90s with activity. Of note, she had a recent hospitalization for pleural effusion and SOB. Admitted with acute respiratory failure with hypoxia and hypercapnia, R pleural effusion, and acute on chronic heart failure. PMH OA, DM, HLD, HTN, CHF, osteoporosis   Clinical Impression   Patient is currently requiring assistance with ADLs including total assist with toileting, total assist with LE dressing, moderate to maximum assist with bathing, and Min guard to Min assist with transfers.  Current level of function is below patient's typical baseline.  During this evaluation, patient was limited by generalized weakness, impaired activity tolerance, desaturating to 82% after bed mobility, 1 stand and 3 lateral steps, as well as chest pain, all of which has the potential to impact patient's safety and independence during functional mobility, as well as performance for ADLs.  Patient lives with her family, who are able to provide 24/7 supervision and assistance.  Patient demonstrates good rehab potential, and should benefit from continued skilled occupational therapy services while in acute care to maximize safety, independence and quality of life at home.  Continued occupational therapy services in the home is recommended.  ?     Recommendations for follow up therapy are one component of a multi-disciplinary discharge planning process, led by the attending physician.  Recommendations may be updated based on patient status, additional functional criteria and insurance authorization.   Follow Up Recommendations  Home health OT    Assistance Recommended at Discharge Frequent or constant Supervision/Assistance  Functional Status Assessment  Patient has had a  recent decline in their functional status and demonstrates the ability to make significant improvements in function in a reasonable and predictable amount of time.  Equipment Recommendations       Recommendations for Other Services       Precautions / Restrictions Precautions Precautions: Fall;Other (comment) Precaution Comments: watch O2/HR/RR Restrictions Weight Bearing Restrictions: No      Mobility Bed Mobility Overal bed mobility: Needs Assistance Bed Mobility: Supine to Sit;Sit to Supine     Supine to sit: Min guard Sit to supine: Min guard   General bed mobility comments: Increased time for all transitions due to shortness of breath. Pt good about communicating need for rest breaks.    Transfers Overall transfer level: Needs assistance Equipment used: Rolling walker (2 wheels) Transfers: Sit to/from Stand Sit to Stand: Min guard           General transfer comment: Pt stood from EOB to RW with Min guard assist for safety. Pt took 3 lateral steps to [facility] with RW and Min guard assist. Stand to sit with Min guard.      Balance Overall balance assessment: Needs assistance Sitting-balance support: Bilateral upper extremity supported;Feet unsupported Sitting balance-Leahy Scale: Good     Standing balance support: No upper extremity supported;During functional activity;Reliant on assistive device for balance Standing balance-Leahy Scale: Poor               High level balance activites: Side stepping             ADL either performed or assessed with clinical judgement   ADL Overall ADL's : Needs assistance/impaired Eating/Feeding: Set up;Supervision/ safety   Grooming: Sitting;Wash/dry hands;Set up;Supervision/safety   Upper Body Bathing: Minimal assistance;Sitting;Set up   Lower Body Bathing: Maximal  assistance;Sitting/lateral leans Lower Body Bathing Details (indicate cue type and reason): Due to desaturation with light activity. Upper Body  Dressing : Minimal assistance;Set up;Sitting;Supervision/safety   Lower Body Dressing: Total assistance;Sitting/lateral leans Lower Body Dressing Details (indicate cue type and reason): To don socks.   Toilet Transfer Details (indicate cue type and reason): Not assessed due to MD/Nsg in room for EKG. Please see Mobility section. Toileting- Clothing Manipulation and Hygiene: Total assistance;Bed level Toileting - Clothing Manipulation Details (indicate cue type and reason): Currently using pure wick.     Functional mobility during ADLs: Minimal assistance;Min guard;Rolling walker (2 wheels)       Vision   Vision Assessment?: No apparent visual deficits     Perception Perception Perception: Within Functional Limits   Praxis Praxis Praxis: Intact    Pertinent Vitals/Pain Pain Assessment: Faces Faces Pain Scale: Hurts little more Pain Location: Chest Pain Descriptors / Indicators: Tightness Pain Intervention(s): Limited activity within patient's tolerance;Monitored during session;Other (comment) (Nursing and MD in for EKG)     Hand Dominance Right   Extremity/Trunk Assessment Upper Extremity Assessment Upper Extremity Assessment: Generalized weakness   Lower Extremity Assessment Lower Extremity Assessment: Generalized weakness   Cervical / Trunk Assessment Cervical / Trunk Assessment: Kyphotic   Communication Communication Communication: Prefers language other than English   Cognition Arousal/Alertness: Awake/alert Behavior During Therapy: WFL for tasks assessed/performed Overall Cognitive Status: Within Functional Limits for tasks assessed                                       General Comments       Exercises     Shoulder Instructions      Home Living Family/patient expects to be discharged to:: Private residence Living Arrangements: Children Available Help at Discharge: Family;Available 24 hours/day Type of Home: House Home Access: Level  entry     Home Layout: One level     Bathroom Shower/Tub: Teacher, early years/pre: Standard     Home Equipment: Conservation officer, nature (2 wheels)   Additional Comments: Daughter with whom pt lives in room and translating as needed. IPAD unable to get pt's dialect.      Prior Functioning/Environment Prior Level of Function : Needs assist               ADLs Comments: As needed assist from family. Non-specific information. Evaluation truncated due to emergent chest pain and Cardiology in for EKG.        OT Problem List: Decreased activity tolerance;Cardiopulmonary status limiting activity;Pain;Decreased strength;Impaired balance (sitting and/or standing);Decreased knowledge of use of DME or AE      OT Treatment/Interventions: Self-care/ADL training;Therapeutic activities;Therapeutic exercise;Energy conservation;Patient/family education;DME and/or AE instruction;Balance training    OT Goals(Current goals can be found in the care plan section) Acute Rehab OT Goals Patient Stated Goal: None stated, but daughter agreeable to plan for home health OT. OT Goal Formulation: With family Time For Goal Achievement: 12/13/20 Potential to Achieve Goals: Good ADL Goals Pt Will Perform Lower Body Dressing: with set-up;with adaptive equipment;sitting/lateral leans;sit to/from stand;with supervision Pt Will Transfer to Toilet: with supervision;ambulating;regular height toilet Pt Will Perform Toileting - Clothing Manipulation and hygiene: with caregiver independent in assisting;with supervision;sitting/lateral leans;sit to/from stand;with adaptive equipment (Educate family on AE options if needed.) Additional ADL Goal #1: Pt will engage in at least 10 min seated functional activities with good dynamic sitting balance, and with vitals stable  in order to demonstrate improved activity tolerance and balance needed to perform ADLs safely at home.  RPE report no greater than 5/10. Additional ADL  Goal #2: Patient will identify at least 3 energy conservation strategies to employ at home in order to maximize function and quality of life and decrease caregiver burden while preventing exacerbation of symptoms and rehospitalization.  OT Frequency: Min 2X/week   Barriers to D/C:    None known       Co-evaluation              AM-PAC OT "6 Clicks" Daily Activity     Outcome Measure Help from another person eating meals?: A Little Help from another person taking care of personal grooming?: A Little Help from another person toileting, which includes using toliet, bedpan, or urinal?: Total Help from another person bathing (including washing, rinsing, drying)?: A Lot Help from another person to put on and taking off regular upper body clothing?: A Little Help from another person to put on and taking off regular lower body clothing?: A Lot 6 Click Score: 14   End of Session Equipment Utilized During Treatment: Rolling walker (2 wheels);Oxygen Nurse Communication: Mobility status  Activity Tolerance: Treatment limited secondary to medical complications (Comment) (desaturated to 82% on 3L via [place] with light activity.) Patient left: in bed;with call bell/phone within reach;with bed alarm set;with nursing/sitter in room  OT Visit Diagnosis: Unsteadiness on feet (R26.81);Pain;Muscle weakness (generalized) (M62.81) Pain - part of body:  (chest)                Time: 8889-1694 OT Time Calculation (min): 16 min Charges:  OT General Charges $OT Visit: 1 Visit OT Evaluation $OT Eval Low Complexity: 1 Low  Joenathan Sakuma, OT Acute Rehab Services Office: [phone] 11/29/2020  Julien Girt 11/29/2020, 10:16 AM

## 2020-11-29 NOTE — Progress Notes (Signed)
Pt placed on BIPAP and immediately ripped the mask off stating that she couldn't wear it.  Pt educated and encouraged.  Pt adamantly refusing at this time, pt placed back on [place]. BIPAP on standby at bedside, RN aware.

## 2020-11-29 NOTE — Progress Notes (Signed)
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  Patient with history of pleural effusion with recent hospitalization and underwent thoracentesis at that time.  Patient was evaluated by cardiothoracic surgery, Dr. Kipp Brood last Friday and x-ray notable for enlarging right-sided pleural effusion and was referred to pulmonology, Dr. Valeta Harms for thoracentesis.  Due to her continued progressing shortness of breath, patient presented to ED for further evaluation.  Patient typically utilizes 2 L nasal cannula at baseline but had increased to 4 L morning of hospital presentation.  Also with reported orthopnea and requiring increased inclination for sleep.  Patient's daughter did give her an extra dose of Lasix night prior to ED presentation without any significant effect.  Denies any recent travel, or changes in diet.  No sick contacts.  In the ED, patient was found to have acute on chronic respiratory failure with hypercapnia and hypoxia.  Patient was placed on BiPAP.  BNP elevated 414.8.  Sodium 134, potassium 4.0, chloride 92, bicarb 35, creatinine 0.91.  Glucose 125.  Magnesium 2.0.  LFTs within normal limits.  Hospitalist consulted for further evaluation management of acute on chronic hypoxic/hypercapnic respiratory failure with recurrent right-sided pleural effusion.   Assessment & Plan:   Principal Problem:   Acute respiratory failure with hypoxia and hypercapnia (HCC) Active Problems:   Diabetes mellitus type 2, controlled (HCC)   Dyslipidemia   Essential hypertension   Hyponatremia    Hyperthyroidism   Pleural effusion on right   AKI (acute kidney injury) ([place])   Anemia   Acute on chronic heart failure with preserved ejection fraction (HFpEF) (HCC)   Chronic respiratory failure with hypoxia, on home oxygen therapy (HCC)   Anxiety   Severe pulmonary hypertension (HCC)   Chest pain of uncertain etiology   Acute on chronic hypoxic/hypercapnic respiratory failure, POA Patient presenting with progressive shortness of breath and found to be hypoxic and hypercapnic with PaCO2 85.0.  Patient initially started on BiPAP and slowly weaned off.  CT angiogram chest with no definite evidence of pulm embolism, moderate size right pleural effusion with associated ectasis right lower lobe, etiology likely secondary to recurrent pleural effusion. --Continue BiPAP nightly and while sleeping; not tolerating inpatient --Will need outpatient sleep study --Continue supplemental oxygen, maintain SPO2 > 92%, currently on baseline 2-4 L [place] at rest  Right pleural effusion, recurrent Patient with robotic assisted right video thoracoscopy and resection of anterior mediastinal mass 09/17/2020.  Patient has had recurrent effusions since and required thoracentesis on 11/04/2020.  Was evaluated by interventional radiology on 11/24/2020 and felt that pocket was too small for thoracentesis.  Patient underwent right thoracentesis by pulmonology on 11/25/2020 with removal of 250 mL of amber appearing fluid. --PCCM following, appreciate assistance --Pleural fluid culture: No growth --Cytology: Pending --Repeat chest x-ray in the a.m.  Acute on chronic diastolic congestive heart failure BNP elevated 414.8 with recurrent right pleural effusion as above.  Patient was initially started on IV diuresis, but creatinine has increased.  TTE with LVEF 55-60%, mild LVH, RV systolic function mildly reduced, elevated RVSP 83.2, biatrial enlargement, IVC dilated. Patient continues to refuse to wear BiPAP/CPAP, likely  contributing to her progressive  shortness of breath --Increase Lasix to 60 mg p.o. daily today for worsening shortness of breath, increased vascular congestion on chest x-ray --1500 mL fluid restriction --Strict I's and O's and daily weights --BMP daily  Chest pain Patient complaining of chest pain and progressive shortness of breath today.  Chest x-ray 11/26 with cardiomegaly, central pulmonary vascular congestion, stable bilateral pleural effusions.  EKG with NSR, rate 74, QTc 435, no concerning dynamic changes.  Improved with 3 doses nitroglycerin. --Cardiology following --Troponin 42; will continue to trend --Plan right/left heart catheterization tomorrow --Nitroglycerin as needed --Continue monitor on telemetry  Severe pulmonary hypertension TTE 11/22 with severely elevated RVSP of 83, IVC dilation, biatrial enlargement. --Cardiology following, plan right heart catheterization likely Monday  Acute renal failure, not POA: Resolved Etiology likely secondary to overdiuresis with IV furosemide. --Cr 0.91>1.39>1.75>1.59>1.57>1.28>0.99 --Discontinued IV furosemide, transitioned back to oral Lasix --BMP daily  Essential hypertension --Atenolol 50 mg p.o. twice daily --Furosemide 60 mg p.o. daily --Hydralazine 50 mg p.o. 3 times daily --Continue aspirin and statin  Anemia, normocytic --Hgb 11.6>8.8>8.7 --MCV 83.8 --CBC daily  Hyponatremia Etiology likely secondary to hypervolemic hyponatremia in the setting of volume overload from underlying diastolic CHF. --BMP daily  Type 2 diabetes mellitus Hemoglobin A1c 5.9 on 11/03/2020, well controlled.  Diet controlled at home.  GERD: Protonix 40 mg p.o. daily  Hx Hyperthyroidism TSH 2.871 11/25/2020. --Methimazole 2.5 mg p.o. daily  Neuropathy: Gabapentin 100 mg p.o. nightly  Anxiety: Diazepam 2 mg q12h PRN  Dyslipidemia --Simvastatin 20 mg p.o. nightly  Weakness/deconditioning: --PT currently recommending home health PT  but may need to consider SNF if progress low --OT consult: Recommend home health currently  DVT prophylaxis: Place and maintain sequential compression device Start: 11/23/20 1957   Code Status: Full Code Family Communication: Updated patient's daughter present at bedside this morning  Disposition Plan:  Level of care: Telemetry Status is: Inpatient  Remains inpatient appropriate because: Continue monitoring of renal function and respiratory status, will need cardiology evaluation for consideration of right/left heart catheterization for severe pulmonary hypertension    Consultants:  Interventional radiology PCCM  Procedures:  Right thoracentesis 11/23, Dr. Verlee Monte PCCM  Antimicrobials:  None   Subjective: Patient seen examined bedside, lying in bed.  Complaining of worsening shortness of breath and chest pain this morning.  EKG unrevealing, troponin slightly elevated at 42.  Chest pain improved with nitroglycerin.  Cardiology following.  Plan right/left heart catheterization tomorrow.  Suspect worsening due to her noncompliance with BiPAP/CPAP and underlying severe pulmonary artery hypertension.  Will increase furosemide slightly to 60 mg p.o. daily.  Patient and daughter with no other questions or concerns at this time.  Denies headache, no fever/chills, no abdominal pain, no palpitations, no nausea/vomiting/diarrhea.  No other acute concerns currently per nursing staff.  Objective: Vitals:   11/29/20 1045 11/29/20 1053 11/29/20 1058 11/29/20 1103  BP: (!) 176/80 (!) 167/72 (!) 166/76 (!) 154/72  Pulse: 77 74  76  Resp:      Temp:      TempSrc:      SpO2:      Weight:      Height:        Intake/Output Summary (Last 24 hours) at 11/29/2020 1119 Last data filed at 11/29/2020 0700 Gross per 24 hour  Intake 60 ml  Output 400 ml  Net -340 ml   Filed Weights   11/26/20 0500 11/28/20 0500 11/29/20 0500  Weight: 76.1 kg 78.2 kg 79.4 kg    Examination:  General exam:  Appears calm and comfortable  Respiratory system: Coarse breath sounds bilaterally, decreased bilateral bases with crackles, mild inspiratory wheezing bilaterally. normal respiratory effort, on 4 L nasal cannula at rest (baseline 2-4L [place]) Cardiovascular system: S1 & S2 heard, RRR. No JVD, murmurs, rubs, gallops or clicks. No pedal edema. Gastrointestinal system: Abdomen is nondistended, soft and nontender. No organomegaly or masses felt. Normal bowel sounds heard. Central nervous system: Alert and oriented. No focal neurological deficits. Extremities: Symmetric 5 x 5 power. Skin: No rashes, lesions or ulcers Psychiatry: Judgement and insight appear normal. Mood & affect appropriate.     Data Reviewed: I have personally reviewed following labs and imaging studies  CBC: Recent Labs  Lab 11/23/20 1145 11/23/20 1150 11/23/20 1317 11/24/20 0811 11/25/20 0306 11/26/20 0405  WBC 5.5  --   --  4.0 7.3 6.0  NEUTROABS 3.7  --   --  2.9 4.9  --   HGB 9.9* 11.6* 11.6* 8.8* 8.8* 8.7*  HCT 31.4* 34.0* 34.0* 28.1* 28.0* 28.0*  MCV 81.8  --   --  83.9 83.8 84.1  PLT 318  --   --  288 285 976   Basic Metabolic Panel: Recent Labs  Lab 11/23/20 1145 11/23/20 1150 11/25/20 0306 11/26/20 0405 11/27/20 0334 11/28/20 0402 11/29/20 0413  NA 134*   < > 135 136 135 131* 136  K 4.0   < > 3.9 3.8 3.9 4.1 4.3  CL 92*   < > 92* 93* 92* 91* 92*  CO2 35*   < > 35* 36* 37* 35* 40*  GLUCOSE 125*   < > 103* 112* 110* 120* 108*  BUN 9   < > 36* 38* 37* 28* 23  CREATININE 0.91   < > 1.75* 1.59* 1.57* 1.28* 0.99  CALCIUM 9.8   < > 8.9 8.6* 8.5* 8.3* 8.7*  MG 2.0  --  2.2  --   --   --   --   PHOS  --   --  5.3*  --   --   --   --    < > = values in this interval not displayed.   GFR: Estimated Creatinine Clearance: 42 mL/min (by C-G formula based on SCr of 0.99 mg/dL). Liver Function Tests: Recent Labs  Lab 11/23/20 1145 11/24/20 0811 11/25/20 0306 11/25/20 2022  AST 19 15 15   --   ALT 8 10 11    --   ALKPHOS 35* 29* 33*  --   BILITOT 0.4 0.4 0.6  --   PROT 7.6 6.6 6.4* 6.2*  ALBUMIN 4.4 3.5 3.7  --    No results for input(s): LIPASE, AMYLASE in the last 168 hours. No results for input(s): AMMONIA in the last 168 hours. Coagulation Profile: No results for input(s): INR, PROTIME in the last 168 hours. Cardiac Enzymes: No results for input(s): CKTOTAL, CKMB, CKMBINDEX, TROPONINI in the last 168 hours. BNP (last 3 results) No results for input(s): PROBNP in the last 8760 hours. HbA1C: No results for input(s): HGBA1C in the last 72 hours. CBG: Recent Labs  Lab 11/26/20 1643  GLUCAP 147*   Lipid Profile: No results for input(s): CHOL, HDL, LDLCALC, TRIG, CHOLHDL, LDLDIRECT in the last 72 hours. Thyroid Function Tests: No results for input(s): TSH, T4TOTAL, FREET4, T3FREE, THYROIDAB in the last 72 hours.  Anemia Panel: No results for input(s): VITAMINB12, FOLATE, FERRITIN, TIBC, IRON, RETICCTPCT in the last 72 hours. Sepsis Labs: No results for input(s): PROCALCITON, LATICACIDVEN in the  last 168 hours.  Recent Results (from the past 240 hour(s))  Resp Panel by RT-PCR (Flu A&B, Covid) Nasopharyngeal Swab     Status: None   Collection Time: 11/23/20 11:31 AM   Specimen: Nasopharyngeal Swab; Nasopharyngeal(NP) swabs in vial transport medium  Result Value Ref Range Status   SARS Coronavirus 2 by RT PCR NEGATIVE NEGATIVE Final    Comment: (NOTE) SARS-CoV-2 target nucleic acids are NOT DETECTED.  The SARS-CoV-2 RNA is generally detectable in upper respiratory specimens during the acute phase of infection. The lowest concentration of SARS-CoV-2 viral copies this assay can detect is 138 copies/mL. A negative result does not preclude SARS-Cov-2 infection and should not be used as the sole basis for treatment or other patient management decisions. A negative result may occur with  improper specimen collection/handling, submission of specimen other than nasopharyngeal swab,  presence of viral mutation(s) within the areas targeted by this assay, and inadequate number of viral copies(<138 copies/mL). A negative result must be combined with clinical observations, patient history, and epidemiological information. The expected result is Negative.  Fact Sheet for Patients:  EntrepreneurPulse.com.au  Fact Sheet for Healthcare Providers:  IncredibleEmployment.be  This test is no t yet approved or cleared by the [place] FDA and  has been authorized for detection and/or diagnosis of SARS-CoV-2 by FDA under an Emergency Use Authorization (EUA). This EUA will remain  in effect (meaning this test can be used) for the duration of the COVID-19 declaration under Section 564(b)(1) of the Act, 21 U.S.C.section 360bbb-3(b)(1), unless the authorization is terminated  or revoked sooner.       Influenza A by PCR NEGATIVE NEGATIVE Final   Influenza B by PCR NEGATIVE NEGATIVE Final    Comment: (NOTE) The Xpert Xpress SARS-CoV-2/FLU/RSV plus assay is intended as an aid in the diagnosis of influenza from Nasopharyngeal swab specimens and should not be used as a sole basis for treatment. Nasal washings and aspirates are unacceptable for Xpert Xpress SARS-CoV-2/FLU/RSV testing.  Fact Sheet for Patients: EntrepreneurPulse.com.au  Fact Sheet for Healthcare Providers: IncredibleEmployment.be  This test is not yet approved or cleared by the [place] FDA and has been authorized for detection and/or diagnosis of SARS-CoV-2 by FDA under an Emergency Use Authorization (EUA). This EUA will remain in effect (meaning this test can be used) for the duration of the COVID-19 declaration under Section 564(b)(1) of the Act, 21 U.S.C. section 360bbb-3(b)(1), unless the authorization is terminated or revoked.  Performed at [facility], [address] [place], [place] [address]   MRSA  Next Gen by PCR, Nasal     Status: None   Collection Time: 11/24/20  4:15 PM   Specimen: Nasal Mucosa; Nasal Swab  Result Value Ref Range Status   MRSA by PCR Next Gen NOT DETECTED NOT DETECTED Final    Comment: (NOTE) The GeneXpert MRSA Assay (FDA approved for NASAL specimens only), is one component of a comprehensive MRSA colonization surveillance program. It is not intended to diagnose MRSA infection nor to guide or monitor treatment for MRSA infections. Test performance is not FDA approved in patients less than 34 years old. Performed at [facility], [place] [address], [place], [place] [address]   Body fluid culture w Gram Stain     Status: None   Collection Time: 11/25/20  6:44 PM   Specimen: Pleura; Body Fluid  Result Value Ref Range Status   Specimen Description   Final    PLEURAL RIGHT Performed at [facility]  [facility], [place] [address], [place], [place] [address]    Special Requests   Final    NONE Performed at [facility], [place] [address], [place], [place] [address]    Gram Stain   Final    FEW WBC PRESENT, PREDOMINANTLY MONONUCLEAR NO ORGANISMS SEEN    Culture   Final    NO GROWTH Performed at [facility], [place] [address], [place], [place] [address]    Report Status 11/29/2020 FINAL  Final         Radiology Studies: DG CHEST PORT 1 VIEW  Result Date: 11/28/2020 CLINICAL DATA:  Shortness of breath. 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 EXAM: PORTABLE CHEST 1 VIEW COMPARISON:  Chest x-ray 11/25/2020.  Chest CT 11/23/2020. FINDINGS: Cardiomediastinal silhouette appears enlarged unchanged. There are small bilateral pleural effusions, right greater than left. These are unchanged from prior. There is central pulmonary vascular congestion and bibasilar opacities. No pneumothorax. No acute fractures. IMPRESSION: 1. Cardiomegaly with central pulmonary vascular congestion 2. Stable bilateral pleural effusions  and bibasilar atelectasis/airspace disease. Electronically Signed   By: Ronney Asters M.D.   On: 11/28/2020 16:22        Scheduled Meds:  aspirin EC  81 mg Oral Daily   atenolol  50 mg Oral BID   furosemide  60 mg Oral Daily   gabapentin  100 mg Oral QHS   hydrALAZINE  50 mg Oral TID   mouth rinse  15 mL Mouth Rinse BID   melatonin  10 mg Oral QHS   methimazole  2.5 mg Oral Daily   pantoprazole  40 mg Oral Q0600   simvastatin  20 mg Oral QHS   sodium chloride flush  3 mL Intravenous Q12H   sodium chloride flush  3 mL Intravenous Q12H   Continuous Infusions:  sodium chloride       LOS: 6 days    Time spent: 37 minutes spent on chart review, discussion with nursing staff, consultants, updating family and interview/physical exam; more than 50% of that time was spent in counseling and/or coordination of care.    Donavan Kerlin J [place] DO [facility] Available via Epic secure chat 7am-7pm After these hours, please refer to coverage provider listed on [email or web address] 11/29/2020, 11:19 AM

## 2020-11-29 NOTE — Progress Notes (Signed)
After 3 nitroglycerin tablets have been, most recent BP is 154/72. At this time, new dose of furosemide, 60 mg has been given per order. Patient is now resting. Will continue to monitor.  Layla Maw, RN

## 2020-11-29 NOTE — Progress Notes (Signed)
2154 RT put patient on BiPAP, pt immediately took it off.  Pt has taken O2 off 2 times this shift so far @ 2018 and 2215, causing the patient to desat to 70's and creating a great deal of anxiety for which the RN medicated pt for @ 2018.  Second time RN told patient that she had to go on BIPAP when she was sleeping, told her it would make her rest better.  Pt agreeable and was placed on BIPAP @ 2217

## 2020-11-29 NOTE — Progress Notes (Addendum)
   11/29/20 1058 11/29/20 1103  Vitals  BP (!) 166/76 (!) 154/72  MAP (mmHg)  --  94  Pulse Rate  --  76  Pt reports pain  in bilateral upper chest is much better. Tightness has resolved after Nitro(3rd dose). Will continue to monitor.

## 2020-11-29 NOTE — Progress Notes (Addendum)
Progress Note  Patient Name: Mandy [name] Date of Encounter: 11/29/2020  [facility] Cardiologist: None   Subjective   Renal function continues to improve, creatinine 0.99 this morning.  BP 158/78.  SPO2 is 95% on 3 L.  Reporting worsening dyspnea and chest pain this morning.  Inpatient Medications    Scheduled Meds:  aspirin EC  81 mg Oral Daily   atenolol  50 mg Oral BID   furosemide  40 mg Oral Daily   gabapentin  100 mg Oral QHS   hydrALAZINE  50 mg Oral TID   mouth rinse  15 mL Mouth Rinse BID   melatonin  10 mg Oral QHS   methimazole  2.5 mg Oral Daily   pantoprazole  40 mg Oral Q0600   simvastatin  20 mg Oral QHS   sodium chloride flush  3 mL Intravenous Q12H   Continuous Infusions:  sodium chloride     PRN Meds: sodium chloride, acetaminophen **OR** acetaminophen, albuterol, alum & mag hydroxide-simeth, diazepam, hydrALAZINE, lip balm, senna-docusate, sodium chloride flush   Vital Signs    Vitals:   11/29/20 0431 11/29/20 0500 11/29/20 0543 11/29/20 0610  BP: (!) 157/77  (!) 158/78   Pulse: 81  77   Resp: 20     Temp: 97.9 F (36.6 C)     TempSrc: Oral     SpO2: 98%  98% 95%  Weight:  79.4 kg    Height:        Intake/Output Summary (Last 24 hours) at 11/29/2020 1610 Last data filed at 11/29/2020 0700 Gross per 24 hour  Intake 60 ml  Output 400 ml  Net -340 ml   Last 3 Weights 11/29/2020 11/28/2020 11/26/2020  Weight (lbs) 175 lb 172 lb 4.8 oz 167 lb 12.3 oz  Weight (kg) 79.379 kg 78.155 kg 76.1 kg      Telemetry    NSR - Personally Reviewed  ECG    NSR, rate 74, <30mm ST dep in III - Personally Reviewed  Physical Exam   GEN: No acute distress.   Neck: + JVD Cardiac: RRR, no murmurs, rubs, or gallops.  Respiratory: Expiratory wheezing GI: Soft, nontender, non-distended  MS: No edema; No deformity. Neuro:  Nonfocal  Psych: Normal affect   Labs    High Sensitivity Troponin:   Recent Labs  Lab 11/03/20 1241 11/03/20 1843   TROPONINIHS 35* 37*     Chemistry Recent Labs  Lab 11/23/20 1145 11/23/20 1150 11/24/20 0811 11/25/20 0306 11/25/20 2022 11/26/20 0405 11/27/20 0334 11/28/20 0402 11/29/20 0413  NA 134*   < > 137 135  --    < > 135 131* 136  K 4.0   < > 4.2 3.9  --    < > 3.9 4.1 4.3  CL 92*  --  94* 92*  --    < > 92* 91* 92*  CO2 35*  --  35* 35*  --    < > 37* 35* 40*  GLUCOSE 125*  --  117* 103*  --    < > 110* 120* 108*  BUN 9  --  24* 36*  --    < > 37* 28* 23  CREATININE 0.91  --  1.39* 1.75*  --    < > 1.57* 1.28* 0.99  CALCIUM 9.8  --  8.9 8.9  --    < > 8.5* 8.3* 8.7*  MG 2.0  --   --  2.2  --   --   --   --   --  PROT 7.6  --  6.6 6.4* 6.2*  --   --   --   --   ALBUMIN 4.4  --  3.5 3.7  --   --   --   --   --   AST 19  --  15 15  --   --   --   --   --   ALT 8  --  10 11  --   --   --   --   --   ALKPHOS 35*  --  29* 33*  --   --   --   --   --   BILITOT 0.4  --  0.4 0.6  --   --   --   --   --   GFRNONAA >60  --  39* 29*  --    < > 33* 43* 58*  ANIONGAP 7  --  8 8  --    < > 6 5 4*   < > = values in this interval not displayed.    Lipids No results for input(s): CHOL, TRIG, HDL, LABVLDL, LDLCALC, CHOLHDL in the last 168 hours.  Hematology Recent Labs  Lab 11/24/20 0811 11/25/20 0306 11/26/20 0405  WBC 4.0 7.3 6.0  RBC 3.35* 3.34* 3.33*  HGB 8.8* 8.8* 8.7*  HCT 28.1* 28.0* 28.0*  MCV 83.9 83.8 84.1  MCH 26.3 26.3 26.1  MCHC 31.3 31.4 31.1  RDW 16.0* 16.2* 16.0*  PLT 288 285 288   Thyroid  Recent Labs  Lab 11/25/20 0955  TSH 2.871    BNP Recent Labs  Lab 11/23/20 1145  BNP 414.8*    DDimer No results for input(s): DDIMER in the last 168 hours.   Radiology    DG CHEST PORT 1 VIEW  Result Date: 11/28/2020 CLINICAL DATA:  Shortness of breath. 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 EXAM: PORTABLE CHEST 1 VIEW COMPARISON:  Chest x-ray 11/25/2020.  Chest CT 11/23/2020. FINDINGS: Cardiomediastinal silhouette appears enlarged unchanged. There are  small bilateral pleural effusions, right greater than left. These are unchanged from prior. There is central pulmonary vascular congestion and bibasilar opacities. No pneumothorax. No acute fractures. IMPRESSION: 1. Cardiomegaly with central pulmonary vascular congestion 2. Stable bilateral pleural effusions and bibasilar atelectasis/airspace disease. Electronically Signed   By: Ronney Asters M.D.   On: 11/28/2020 16:22    Cardiac Studies   Echo 11/24/20:  1. Left ventricular ejection fraction, by estimation, is 55 to 60%. The  left ventricle has normal function. There is mild left ventricular  hypertrophy.   2. Right ventricular systolic function is mildly reduced. The right  ventricular size is normal. There is severely elevated pulmonary artery  systolic pressure. The estimated right ventricular systolic pressure is  41.3 mmHg.   3. Left atrial size was mild to moderately dilated.   4. Right atrial size was mildly dilated.   5. The mitral valve is normal in structure. Mild to moderate mitral valve  regurgitation. No evidence of mitral stenosis.   6. The aortic valve is tricuspid. Aortic valve regurgitation is not  visualized. No aortic stenosis is present.   7. The inferior vena cava is dilated in size with <50% respiratory  variability, suggesting right atrial pressure of 15 mmHg.   8. Left pleural effusion noted.   Patient Profile     80 y.o. 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  Likely group 2 and  group 3, with her chronic pulmonary disease and diastolic heart failure contributing. -Plan RHC tomorrow for further evaluation.   Chest pain: reporting intermittent chest pain.  EKG unremarkable, will check troponins to r/o MI.  Chest pain this morning resolved with nitroglycerin.  Does have coronary calcifications on recent CT chest.  Planning RHC tomorrow, will also plan for LHC -Risks and benefits of cardiac catheterization have been discussed with the patient and her daughter.  These include bleeding, infection, kidney damage, stroke, heart attack, death.  The patient understands these risks and is willing to proceed.   Chronic diastolic heart failure: Worsening renal function with IV Lasix, switched to p.o. Lasix 40 mg daily with improvement in renal function.  Continue PO lasix   Acute hypoxic in hypercapnic respiratory failure: Initially required BiPAP.  Improving.  CTPA showed no PE, moderate size right pleural effusion.  Pulmonology following.  Suspect component of OHS/OSA.   Recurrent pleural effusion: Underwent thoracentesis on 11/23.   AKI: Creatinine increased from 0.9 up to 1.75.  Improving with holding IV Lasix, down to 0.99 today  For questions or updates, please contact [place] Please consult [email or web address] for contact info under        Signed, Donato Heinz, MD  11/29/2020, 8:22 AM

## 2020-11-29 NOTE — Progress Notes (Signed)
Pre cath orders written per d/w Dr. Gardiner Rhyme. Listed on add-on board for Monday.

## 2020-11-29 NOTE — Progress Notes (Signed)
   11/29/20 1045  Vitals  BP (!) 176/80  MAP (mmHg) 107  BP Location Left Arm  BP Method Automatic  Pulse Rate 77  MEWS COLOR  MEWS Score Color Green  MEWS Score  MEWS Temp 0  MEWS Systolic 0  MEWS Pulse 0  MEWS RR 1  MEWS LOC 0  MEWS Score 1  The patient complained of worsening pressure/pain in bilateral upper chest. Nitro administered (2nd dose). Patient states chest pain is better now. Pt also complaining of some SOB, O2 sat 97% 4 lt. Albuterol administered also. Will continue to monitor.

## 2020-11-29 NOTE — Progress Notes (Addendum)
   11/29/20 1058  Vitals  BP (!) 166/76  MAP (mmHg) 101  Patient reports chest pain/discomfort/tightness as improving but pain continues upper bilateral chest. Nitro administered (3rd dose).

## 2020-11-30 ENCOUNTER — Inpatient Hospital Stay (HOSPITAL_COMMUNITY)
Admission: EM | Disposition: A | Discharge status: Home Health | Payer: Self-pay | Source: Home / Self Care | Attending: Internal Medicine

## 2020-11-30 ENCOUNTER — Inpatient Hospital Stay (HOSPITAL_COMMUNITY): Payer: Medicare HMO

## 2020-11-30 DIAGNOSIS — I25110 Atherosclerotic heart disease of native coronary artery with unstable angina pectoris: Secondary | ICD-10-CM | POA: Diagnosis not present

## 2020-11-30 DIAGNOSIS — J9601 Acute respiratory failure with hypoxia: Secondary | ICD-10-CM | POA: Diagnosis not present

## 2020-11-30 DIAGNOSIS — J9602 Acute respiratory failure with hypercapnia: Secondary | ICD-10-CM | POA: Diagnosis not present

## 2020-11-30 HISTORY — PX: RIGHT/LEFT HEART CATH AND CORONARY ANGIOGRAPHY: CATH118266

## 2020-11-30 HISTORY — PX: LEFT HEART CATH AND CORONARY ANGIOGRAPHY: CATH118249

## 2020-11-30 LAB — POCT I-STAT EG7
Acid-Base Excess: 19 mmol/L — ABNORMAL HIGH (ref 0.0–2.0)
Acid-Base Excess: 17 mmol/L — ABNORMAL HIGH (ref 0.0–2.0)
Bicarbonate: 46.6 mmol/L — ABNORMAL HIGH (ref 20.0–28.0)
Bicarbonate: 45.2 mmol/L — ABNORMAL HIGH (ref 20.0–28.0)
Calcium, Ion: 1.16 mmol/L (ref 1.15–1.40)
Calcium, Ion: 1.14 mmol/L — ABNORMAL LOW (ref 1.15–1.40)
HCT: 33 % — ABNORMAL LOW (ref 36.0–46.0)
HCT: 33 % — ABNORMAL LOW (ref 36.0–46.0)
Hemoglobin: 11.2 g/dL — ABNORMAL LOW (ref 12.0–15.0)
Hemoglobin: 11.2 g/dL — ABNORMAL LOW (ref 12.0–15.0)
O2 Saturation: 67 %
O2 Saturation: 63 %
Potassium: 3.3 mmol/L — ABNORMAL LOW (ref 3.5–5.1)
Potassium: 3.2 mmol/L — ABNORMAL LOW (ref 3.5–5.1)
Sodium: 135 mmol/L (ref 135–145)
Sodium: 137 mmol/L (ref 135–145)
TCO2: 49 mmol/L — ABNORMAL HIGH (ref 22–32)
TCO2: 47 mmol/L — ABNORMAL HIGH (ref 22–32)
pCO2, Ven: 72.8 mmHg (ref 44.0–60.0)
pCO2, Ven: 71.7 mmHg (ref 44.0–60.0)
pH, Ven: 7.414 (ref 7.250–7.430)
pH, Ven: 7.407 (ref 7.250–7.430)
pO2, Ven: 37 mmHg (ref 32.0–45.0)
pO2, Ven: 34 mmHg (ref 32.0–45.0)

## 2020-11-30 LAB — POCT I-STAT 7, (LYTES, BLD GAS, ICA,H+H)
Acid-Base Excess: 17 mmol/L — ABNORMAL HIGH (ref 0.0–2.0)
Bicarbonate: 44.5 mmol/L — ABNORMAL HIGH (ref 20.0–28.0)
Calcium, Ion: 1.12 mmol/L — ABNORMAL LOW (ref 1.15–1.40)
HCT: 32 % — ABNORMAL LOW (ref 36.0–46.0)
Hemoglobin: 10.9 g/dL — ABNORMAL LOW (ref 12.0–15.0)
O2 Saturation: 96 %
Potassium: 3.3 mmol/L — ABNORMAL LOW (ref 3.5–5.1)
Sodium: 137 mmol/L (ref 135–145)
TCO2: 47 mmol/L — ABNORMAL HIGH (ref 22–32)
pCO2 arterial: 69.4 mmHg (ref 32.0–48.0)
pH, Arterial: 7.415 (ref 7.350–7.450)
pO2, Arterial: 84 mmHg (ref 83.0–108.0)

## 2020-11-30 LAB — BASIC METABOLIC PANEL
Anion gap: 7 (ref 5–15)
BUN: 18 mg/dL (ref 8–23)
CO2: 41 mmol/L — ABNORMAL HIGH (ref 22–32)
Calcium: 9.2 mg/dL (ref 8.9–10.3)
Chloride: 89 mmol/L — ABNORMAL LOW (ref 98–111)
Creatinine, Ser: 0.98 mg/dL (ref 0.44–1.00)
GFR, Estimated: 59 mL/min — ABNORMAL LOW (ref >60)
Glucose, Bld: 112 mg/dL — ABNORMAL HIGH (ref 70–99)
Potassium: 4 mmol/L (ref 3.5–5.1)
Sodium: 137 mmol/L (ref 135–145)

## 2020-11-30 LAB — MAGNESIUM: Magnesium: 2.2 mg/dL (ref 1.7–2.4)

## 2020-11-30 LAB — CYTOLOGY - NON PAP

## 2020-11-30 LAB — CBC
HCT: 31.5 % — ABNORMAL LOW (ref 36.0–46.0)
Hemoglobin: 9.5 g/dL — ABNORMAL LOW (ref 12.0–15.0)
MCH: 25.8 pg — ABNORMAL LOW (ref 26.0–34.0)
MCHC: 30.2 g/dL (ref 30.0–36.0)
MCV: 85.6 fL (ref 80.0–100.0)
Platelets: 315 10*3/uL (ref 150–400)
RBC: 3.68 MIL/uL — ABNORMAL LOW (ref 3.87–5.11)
RDW: 15.9 % — ABNORMAL HIGH (ref 11.5–15.5)
WBC: 5.4 10*3/uL (ref 4.0–10.5)
nRBC: 0 % (ref 0.0–0.2)

## 2020-11-30 SURGERY — LEFT HEART CATH AND CORONARY ANGIOGRAPHY
Anesthesia: LOCAL

## 2020-11-30 MED ORDER — LIDOCAINE HCL (PF) 1 % IJ SOLN
INTRAMUSCULAR | Status: DC | PRN
  Administered 2020-11-30 (×3): 5 mL

## 2020-11-30 MED ORDER — ACETAMINOPHEN 325 MG PO TABS
650.0000 mg | ORAL_TABLET | ORAL | Status: DC | PRN
  Administered 2020-11-30 – 2020-12-08 (×16): 650 mg via ORAL
  Filled 2020-11-30 (×15): qty 2

## 2020-11-30 MED ORDER — LABETALOL HCL 5 MG/ML IV SOLN
10.0000 mg | INTRAVENOUS | Status: AC | PRN
  Administered 2020-11-30: 19:00:00 10 mg via INTRAVENOUS

## 2020-11-30 MED ORDER — FENTANYL CITRATE (PF) 100 MCG/2ML IJ SOLN
INTRAMUSCULAR | Status: AC
  Filled 2020-11-30: qty 2

## 2020-11-30 MED ORDER — LABETALOL HCL 5 MG/ML IV SOLN
INTRAVENOUS | Status: AC
  Filled 2020-11-30: qty 4

## 2020-11-30 MED ORDER — VERAPAMIL HCL 2.5 MG/ML IV SOLN
INTRAVENOUS | Status: DC | PRN
  Administered 2020-11-30 (×2): 10 mL via INTRA_ARTERIAL

## 2020-11-30 MED ORDER — MIDAZOLAM HCL 2 MG/2ML IJ SOLN
INTRAMUSCULAR | Status: AC
  Filled 2020-11-30: qty 2

## 2020-11-30 MED ORDER — SODIUM CHLORIDE 0.9% FLUSH
3.0000 mL | Freq: Two times a day (BID) | INTRAVENOUS | Status: DC
  Administered 2020-11-30: 22:00:00 3 mL via INTRAVENOUS

## 2020-11-30 MED ORDER — HEPARIN SODIUM (PORCINE) 1000 UNIT/ML IJ SOLN
INTRAMUSCULAR | Status: AC
  Filled 2020-11-30: qty 10

## 2020-11-30 MED ORDER — FENTANYL CITRATE (PF) 100 MCG/2ML IJ SOLN
INTRAMUSCULAR | Status: DC | PRN
  Administered 2020-11-30: 25 ug via INTRAVENOUS

## 2020-11-30 MED ORDER — CHLORHEXIDINE GLUCONATE CLOTH 2 % EX PADS
6.0000 | MEDICATED_PAD | Freq: Every day | CUTANEOUS | Status: DC
  Administered 2020-11-30 – 2020-12-04 (×4): 6 via TOPICAL

## 2020-11-30 MED ORDER — MIDAZOLAM HCL 2 MG/2ML IJ SOLN
INTRAMUSCULAR | Status: DC | PRN
  Administered 2020-11-30: 1 mg via INTRAVENOUS

## 2020-11-30 MED ORDER — HEPARIN (PORCINE) IN NACL 1000-0.9 UT/500ML-% IV SOLN
INTRAVENOUS | Status: DC | PRN
  Administered 2020-11-30 (×2): 500 mL

## 2020-11-30 MED ORDER — SODIUM CHLORIDE 0.9 % IV SOLN: 250.0000 mL | INTRAVENOUS | Status: DC | PRN

## 2020-11-30 MED ORDER — ONDANSETRON HCL 4 MG/2ML IJ SOLN: 4.0000 mg | Freq: Four times a day (QID) | INTRAMUSCULAR | Status: DC | PRN

## 2020-11-30 MED ORDER — SODIUM CHLORIDE 0.9% FLUSH: 3.0000 mL | INTRAVENOUS | Status: DC | PRN

## 2020-11-30 MED ORDER — VERAPAMIL HCL 2.5 MG/ML IV SOLN
INTRAVENOUS | Status: AC
  Filled 2020-11-30: qty 2

## 2020-11-30 MED ORDER — HEPARIN SODIUM (PORCINE) 1000 UNIT/ML IJ SOLN
INTRAMUSCULAR | Status: DC | PRN
  Administered 2020-11-30: 5000 [IU] via INTRAVENOUS

## 2020-11-30 MED ORDER — IOHEXOL 350 MG/ML SOLN
INTRAVENOUS | Status: DC | PRN
  Administered 2020-11-30: 17:00:00 90 mL via INTRA_ARTERIAL

## 2020-11-30 MED ORDER — HEPARIN (PORCINE) IN NACL 1000-0.9 UT/500ML-% IV SOLN
INTRAVENOUS | Status: AC
  Filled 2020-11-30: qty 1000

## 2020-11-30 MED ORDER — LIDOCAINE HCL (PF) 1 % IJ SOLN
INTRAMUSCULAR | Status: AC
  Filled 2020-11-30: qty 30

## 2020-11-30 MED ORDER — ACETAMINOPHEN 325 MG PO TABS
ORAL_TABLET | ORAL | Status: AC
  Filled 2020-11-30: qty 2

## 2020-11-30 MED ORDER — HYDRALAZINE HCL 20 MG/ML IJ SOLN
10.0000 mg | INTRAMUSCULAR | Status: AC | PRN
  Administered 2020-11-30 (×2): 10 mg via INTRAVENOUS

## 2020-11-30 MED ORDER — HYDRALAZINE HCL 20 MG/ML IJ SOLN
INTRAMUSCULAR | Status: AC
  Filled 2020-11-30: qty 1

## 2020-11-30 SURGICAL SUPPLY — 18 items
CATH DIAG 6FR JR4 (CATHETERS) ×1 IMPLANT
CATH INFINITI 5FR ANG PIGTAIL (CATHETERS) ×1 IMPLANT
CATH INFINITI 6F FL3.5 (CATHETERS) ×1 IMPLANT
CATH SWAN GANZ 7F STRAIGHT (CATHETERS) ×1 IMPLANT
DEVICE RAD COMP TR BAND LRG (VASCULAR PRODUCTS) ×2 IMPLANT
GLIDESHEATH SLEND SS 6F .021 (SHEATH) ×2 IMPLANT
GUIDEWIRE ANGLED .035X150CM (WIRE) ×1 IMPLANT
GUIDEWIRE TIGER .035X300 (WIRE) ×1 IMPLANT
KIT CV 3L 7FR 20CM SULFAFREE (SET/KITS/TRAYS/PACK) ×1 IMPLANT
KIT HEART LEFT (KITS) ×3 IMPLANT
PACK CARDIAC CATHETERIZATION (CUSTOM PROCEDURE TRAY) ×3 IMPLANT
SHEATH PINNACLE 7F 10CM (SHEATH) ×1 IMPLANT
SHEATH PROBE COVER 6X72 (BAG) ×1 IMPLANT
TRANSDUCER W/STOPCOCK (MISCELLANEOUS) ×3 IMPLANT
TUBING CIL FLEX 10 FLL-RA (TUBING) ×3 IMPLANT
WIRE ASAHI PROWATER 300CM (WIRE) ×1 IMPLANT
WIRE EMERALD 3MM-J .035X260CM (WIRE) ×1 IMPLANT
WIRE MICRO SET SILHO 5FR 7 (SHEATH) ×1 IMPLANT

## 2020-11-30 NOTE — Progress Notes (Signed)
Pt arrived to unit via [facility] on stretcher.  Daughter already in the room.  [facility] helped transfer the patient from stretcher to the bed.  Daughter assisted in getting linens out from under the patient and helped get her settled.  VSS pt on 4L O2, purewick in place.  RN to check diet orders.

## 2020-11-30 NOTE — Progress Notes (Signed)
Pt adamant about not wearing BiPAP qhs.

## 2020-11-30 NOTE — Progress Notes (Signed)
Pt attempted to take coban off left wrist.  RN removed, put gauze and tegaderm dressing.  No S/S swelling or bleeding will continue to monitor

## 2020-11-30 NOTE — Interval H&P Note (Signed)
History and Physical Interval Note:  11/30/2020 1:43 PM  Mandy [name]  has presented today for surgery, with the diagnosis of unstable angina.  The various methods of treatment have been discussed with the patient and family. After consideration of risks, benefits and other options for treatment, the patient has consented to  Procedure(s): LEFT HEART CATH AND CORONARY ANGIOGRAPHY (N/A) as a surgical intervention.  The patient's history has been reviewed, patient examined, no change in status, stable for surgery.  I have reviewed the patient's chart and labs.  Questions were answered to the patient's satisfaction.    Cath Lab Visit (complete for each Cath Lab visit)  Clinical Evaluation Leading to the Procedure:   ACS: Yes.    Non-ACS:    Anginal Classification: CCS II  Anti-ischemic medical therapy: Minimal Therapy (1 class of medications)  Non-Invasive Test Results: No non-invasive testing performed  Prior CABG: No previous CABG        Early Osm[place]

## 2020-11-30 NOTE — Progress Notes (Incomplete)
Received patient from [facility]  via [facility]. Patient was alert and oriented , skin warm and dry, resp even and unlabored with no facial expression or signs of pain at this time. IV team consulted for IV access.  Patient placed on monitor.  Daughter at bedside as an Teaching laboratory technician

## 2020-11-30 NOTE — Progress Notes (Addendum)
LOCATION: Left and Right RADIAL  DEFLATED PER PROTOCOL: YES  TIME BAND OFF/DRESSING APPLIED: 1910  SITE UPON ARRIVAL: LEVEL 0  SITE AFTER BAND REMOVAL: LEVEL 0 to right wrist, level 1 to left wrist  CIRCULATION SENSATION AND MOVEMENT: bilateral radial pulses palpable at +2, + bilateral movement  COMMENTS: TR band removed from bilateral wrists, slight bruising noted to right wrist around insertion site, small hardened area noted to left wrist, above tr band, manual pressure applied for approximately 5 minutes (now level 0), area softened, co-band applied to left wrist on top of gauze and tegaderm for added pressure, safety maintained, daughter remains at bedside

## 2020-11-30 NOTE — Progress Notes (Signed)
PT Cancellation Note  Patient Details Name: Mandy [name] MRN: [id number] DOB: 09/01/1940   Cancelled Treatment:    Reason Eval/Treat Not Completed: Patient at procedure or test/unavailable;Patient not medically ready;Other (comment) (RN reporting pt going to [facility] for heart cath today, will hold utnil pt returns and follow up at later date/time as pt able and schedule allows.)   Verner Mould, DPT [place] Office [phone] Pager [phone]

## 2020-11-30 NOTE — Progress Notes (Signed)
Brief cardiology update note:  Pending L/RHC today. Consented, see note from Dr. Gardiner Rhyme. Will await results of cath to give further recommendations.  Buford Dresser, MD, PhD, [facility] at [facility] at [facility] [address] [place], [place] [address] [phone]

## 2020-11-30 NOTE — Plan of Care (Signed)
  Problem: Clinical Measurements: Goal: Ability to maintain clinical measurements within normal limits will improve Outcome: Progressing Goal: Will remain free from infection Outcome: Progressing   Problem: Coping: Goal: Level of anxiety will decrease Outcome: Progressing   Problem: Education: Goal: Ability to demonstrate management of disease process will improve Outcome: Progressing Goal: Ability to verbalize understanding of medication therapies will improve Outcome: Progressing

## 2020-11-30 NOTE — Progress Notes (Signed)
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  Patient with history of pleural effusion with recent hospitalization and underwent thoracentesis at that time.  Patient was evaluated by cardiothoracic surgery, Dr. Kipp Brood last Friday and x-ray notable for enlarging right-sided pleural effusion and was referred to pulmonology, Dr. Valeta Harms for thoracentesis.  Due to her continued progressing shortness of breath, patient presented to ED for further evaluation.  Patient typically utilizes 2 L nasal cannula at baseline but had increased to 4 L morning of hospital presentation.  Also with reported orthopnea and requiring increased inclination for sleep.  Patient's daughter did give her an extra dose of Lasix night prior to ED presentation without any significant effect.  Denies any recent travel, or changes in diet.  No sick contacts.  In the ED, patient was found to have acute on chronic respiratory failure with hypercapnia and hypoxia.  Patient was placed on BiPAP.  BNP elevated 414.8.  Sodium 134, potassium 4.0, chloride 92, bicarb 35, creatinine 0.91.  Glucose 125.  Magnesium 2.0.  LFTs within normal limits.  Hospitalist consulted for further evaluation management of acute on chronic hypoxic/hypercapnic respiratory failure with recurrent right-sided pleural effusion.   Assessment & Plan:   Principal Problem:   Acute respiratory failure with hypoxia and hypercapnia (HCC) Active Problems:   Diabetes mellitus type 2, controlled (HCC)   Dyslipidemia   Essential hypertension   Hyponatremia    Hyperthyroidism   Pleural effusion on right   AKI (acute kidney injury) ([place])   Anemia   Acute on chronic heart failure with preserved ejection fraction (HFpEF) (HCC)   Chronic respiratory failure with hypoxia, on home oxygen therapy (HCC)   Anxiety   Severe pulmonary hypertension (HCC)   Chest pain of uncertain etiology   Acute on chronic hypoxic/hypercapnic respiratory failure, POA Patient presenting with progressive shortness of breath and found to be hypoxic and hypercapnic with PaCO2 85.0.  Patient initially started on BiPAP and slowly weaned off.  CT angiogram chest with no definite evidence of pulm embolism, moderate size right pleural effusion with associated ectasis right lower lobe, etiology likely secondary to recurrent pleural effusion. --Continue BiPAP nightly and while sleeping; continue to encourage compliance --Will need outpatient sleep study --Continue supplemental oxygen, maintain SPO2 > 92%, currently on baseline 2-4 L Mandy [name] at rest  Right pleural effusion, recurrent Patient with robotic assisted right video thoracoscopy and resection of anterior mediastinal mass 09/17/2020.  Patient has had recurrent effusions since and required thoracentesis on 11/04/2020.  Was evaluated by interventional radiology on 11/24/2020 and felt that pocket was too small for thoracentesis.  Patient underwent right thoracentesis by pulmonology on 11/25/2020 with removal of 250 mL of amber appearing fluid.  Cytology with reactive mesothelial cells, atypical cells present. --PCCM following, appreciate assistance --Pleural fluid culture: No growth --Continue furosemide 60 mg p.o. daily  Acute on chronic diastolic congestive heart failure BNP elevated 414.8 with recurrent right pleural effusion as above.  Patient was initially started on IV diuresis, but creatinine has increased.  TTE with LVEF 55-60%, mild LVH, RV systolic function mildly reduced, elevated RVSP 83.2, biatrial enlargement, IVC dilated.  Patient continues to refuse  to wear BiPAP/CPAP, likely contributing to her progressive shortness of breath --Furosemide 60 mg p.o. daily --1500 mL fluid restriction --Strict I's and O's and daily weights --BMP daily  Chest pain Patient complaining of chest pain and progressive shortness of breath today.  Chest x-ray 11/26 with cardiomegaly, central pulmonary vascular congestion, stable bilateral pleural effusions.  EKG with NSR, rate 74, QTc 435, no concerning dynamic changes.  Improved with 3 doses nitroglycerin. --Cardiology following --Troponin 42>41; flat --Plan right/left heart catheterization today --Nitroglycerin as needed --Continue monitor on telemetry  Severe pulmonary hypertension TTE 11/22 with severely elevated RVSP of 83, IVC dilation, biatrial enlargement. --Cardiology following, plan right heart catheterization today  Acute renal failure, not POA: Resolved Etiology likely secondary to overdiuresis with IV furosemide. --Cr 0.91>1.39>1.75>1.59>1.57>1.28>0.99>0.98 --Discontinued IV furosemide, transitioned back to oral Lasix --BMP daily  Essential hypertension --Atenolol 50 mg p.o. twice daily --Furosemide 60 mg p.o. daily --Hydralazine 50 mg p.o. 3 times daily --Continue aspirin and statin  Anemia, normocytic --Hgb 11.6>8.8>8.7>9.5 --MCV 83.8 --CBC daily  Hyponatremia Etiology likely secondary to hypervolemic hyponatremia in the setting of volume overload from underlying diastolic CHF. --BMP daily  Type 2 diabetes mellitus Hemoglobin A1c 5.9 on 11/03/2020, well controlled.  Diet controlled at home.  GERD: Protonix 40 mg p.o. daily  Hx Hyperthyroidism TSH 2.871 11/25/2020. --Methimazole 2.5 mg p.o. daily  Neuropathy: Gabapentin 100 mg p.o. nightly  Anxiety: Diazepam 2 mg q12h PRN  Dyslipidemia --Simvastatin 20 mg p.o. nightly  Weakness/deconditioning: --PT currently recommending home health PT  --OT consult: Recommend home health currently  DVT  prophylaxis: Place and maintain sequential compression device Start: 11/23/20 1957   Code Status: Full Code Family Communication: Updated patient's daughter present at bedside this afternoon  Disposition Plan:  Level of care: Telemetry Status is: Inpatient  Remains inpatient appropriate because: Continue monitoring of renal function and respiratory status, will need cardiology evaluation for consideration of right/left heart catheterization for severe pulmonary hypertension    Consultants:  Interventional radiology PCCM  Procedures:  Right thoracentesis 11/23, Dr. Verlee Monte PCCM  Antimicrobials:  None   Subjective: Patient seen examined bedside, lying in bed.  No family present this morning.  Continues to complain of chest tightness.  Did wear BiPAP occasionally overnight.  Chest x-ray with slight improvement of pulmonary edema.  Creatinine stable on increased dose of furosemide.  Pending right/left heart catheterization today.  No other questions or concerns at this time.  Denies headache, no fever/chills, no abdominal pain, no palpitations, no nausea/vomiting/diarrhea.  No other acute concerns currently per nursing staff.  Objective: Vitals:   11/29/20 2049 11/30/20 0452 11/30/20 0533 11/30/20 1049  BP: (!) 149/57  (!) 192/79 (!) 164/90  Pulse: 84  76 77  Resp: (!) 21  20 (!) 22  Temp: 98 F (36.7 C)  97.9 F (36.6 C) 98 F (36.7 C)  TempSrc: Oral  Oral   SpO2: 100%  99% 99%  Weight:  76.5 kg    Height:        Intake/Output Summary (Last 24 hours) at 11/30/2020 1258 Last data filed at 11/29/2020 2250 Gross per 24 hour  Intake 480 ml  Output 900 ml  Net -420 ml   Filed Weights   11/29/20 0500 11/29/20 1730 11/30/20 0452  Weight: 79.4 kg 76.5 kg 76.5 kg    Examination:  General exam: Appears calm and comfortable, ill in appearance Respiratory system: Coarse breath sounds bilaterally, decreased bilateral bases with crackles, mild inspiratory wheezing bilaterally.  normal respiratory effort, on  4 L nasal cannula at rest (baseline 2-4L [place]) with SPO2 99% at rest Cardiovascular system: S1 & S2 heard, RRR. No JVD, murmurs, rubs, gallops or clicks. No pedal edema. Gastrointestinal system: Abdomen is nondistended, soft and nontender. No organomegaly or masses felt. Normal bowel sounds heard. Central nervous system: Alert and oriented. No focal neurological deficits. Extremities: Symmetric 5 x 5 power. Skin: No rashes, lesions or ulcers Psychiatry: Judgement and insight appear normal. Mood & affect appropriate.     Data Reviewed: I have personally reviewed following labs and imaging studies  CBC: Recent Labs  Lab 11/23/20 1317 11/24/20 0811 11/25/20 0306 11/26/20 0405 11/30/20 0422  WBC  --  4.0 7.3 6.0 5.4  NEUTROABS  --  2.9 4.9  --   --   HGB 11.6* 8.8* 8.8* 8.7* 9.5*  HCT 34.0* 28.1* 28.0* 28.0* 31.5*  MCV  --  83.9 83.8 84.1 85.6  PLT  --  288 285 288 161   Basic Metabolic Panel: Recent Labs  Lab 11/25/20 0306 11/26/20 0405 11/27/20 0334 11/28/20 0402 11/29/20 0413 11/30/20 0422  NA 135 136 135 131* 136 137  K 3.9 3.8 3.9 4.1 4.3 4.0  CL 92* 93* 92* 91* 92* 89*  CO2 35* 36* 37* 35* 40* 41*  GLUCOSE 103* 112* 110* 120* 108* 112*  BUN 36* 38* 37* 28* 23 18  CREATININE 1.75* 1.59* 1.57* 1.28* 0.99 0.98  CALCIUM 8.9 8.6* 8.5* 8.3* 8.7* 9.2  MG 2.2  --   --   --   --  2.2  PHOS 5.3*  --   --   --   --   --    GFR: Estimated Creatinine Clearance: 41.5 mL/min (by C-G formula based on SCr of 0.98 mg/dL). Liver Function Tests: Recent Labs  Lab 11/24/20 0811 11/25/20 0306 11/25/20 2022  AST 15 15  --   ALT 10 11  --   ALKPHOS 29* 33*  --   BILITOT 0.4 0.6  --   PROT 6.6 6.4* 6.2*  ALBUMIN 3.5 3.7  --    No results for input(s): LIPASE, AMYLASE in the last 168 hours. No results for input(s): AMMONIA in the last 168 hours. Coagulation Profile: No results for input(s): INR, PROTIME in the last 168 hours. Cardiac Enzymes: No  results for input(s): CKTOTAL, CKMB, CKMBINDEX, TROPONINI in the last 168 hours. BNP (last 3 results) No results for input(s): PROBNP in the last 8760 hours. HbA1C: No results for input(s): HGBA1C in the last 72 hours. CBG: Recent Labs  Lab 11/26/20 1643  GLUCAP 147*   Lipid Profile: No results for input(s): CHOL, HDL, LDLCALC, TRIG, CHOLHDL, LDLDIRECT in the last 72 hours. Thyroid Function Tests: No results for input(s): TSH, T4TOTAL, FREET4, T3FREE, THYROIDAB in the last 72 hours.  Anemia Panel: No results for input(s): VITAMINB12, FOLATE, FERRITIN, TIBC, IRON, RETICCTPCT in the last 72 hours. Sepsis Labs: No results for input(s): PROCALCITON, LATICACIDVEN in the last 168 hours.  Recent Results (from the past 240 hour(s))  Resp Panel by RT-PCR (Flu A&B, Covid) Nasopharyngeal Swab     Status: None   Collection Time: 11/23/20 11:31 AM   Specimen: Nasopharyngeal Swab; Nasopharyngeal(NP) swabs in vial transport medium  Result Value Ref Range Status   SARS Coronavirus 2 by RT PCR NEGATIVE NEGATIVE Final    Comment: (NOTE) SARS-CoV-2 target nucleic acids are NOT DETECTED.  The SARS-CoV-2 RNA is generally detectable in upper respiratory specimens during the acute phase of infection. The lowest concentration of  SARS-CoV-2 viral copies this assay can detect is 138 copies/mL. A negative result does not preclude SARS-Cov-2 infection and should not be used as the sole basis for treatment or other patient management decisions. A negative result may occur with  improper specimen collection/handling, submission of specimen other than nasopharyngeal swab, presence of viral mutation(s) within the areas targeted by this assay, and inadequate number of viral copies(<138 copies/mL). A negative result must be combined with clinical observations, patient history, and epidemiological information. The expected result is Negative.  Fact Sheet for Patients:   EntrepreneurPulse.com.au  Fact Sheet for Healthcare Providers:  IncredibleEmployment.be  This test is no t yet approved or cleared by the [place] FDA and  has been authorized for detection and/or diagnosis of SARS-CoV-2 by FDA under an Emergency Use Authorization (EUA). This EUA will remain  in effect (meaning this test can be used) for the duration of the COVID-19 declaration under Section 564(b)(1) of the Act, 21 U.S.C.section 360bbb-3(b)(1), unless the authorization is terminated  or revoked sooner.       Influenza A by PCR NEGATIVE NEGATIVE Final   Influenza B by PCR NEGATIVE NEGATIVE Final    Comment: (NOTE) The Xpert Xpress SARS-CoV-2/FLU/RSV plus assay is intended as an aid in the diagnosis of influenza from Nasopharyngeal swab specimens and should not be used as a sole basis for treatment. Nasal washings and aspirates are unacceptable for Xpert Xpress SARS-CoV-2/FLU/RSV testing.  Fact Sheet for Patients: EntrepreneurPulse.com.au  Fact Sheet for Healthcare Providers: IncredibleEmployment.be  This test is not yet approved or cleared by the [place] FDA and has been authorized for detection and/or diagnosis of SARS-CoV-2 by FDA under an Emergency Use Authorization (EUA). This EUA will remain in effect (meaning this test can be used) for the duration of the COVID-19 declaration under Section 564(b)(1) of the Act, 21 U.S.C. section 360bbb-3(b)(1), unless the authorization is terminated or revoked.  Performed at [facility], [address], [place], [place] [address]   MRSA Next Gen by PCR, Nasal     Status: None   Collection Time: 11/24/20  4:15 PM   Specimen: Nasal Mucosa; Nasal Swab  Result Value Ref Range Status   MRSA by PCR Next Gen NOT DETECTED NOT DETECTED Final    Comment: (NOTE) The GeneXpert MRSA Assay (FDA approved for NASAL specimens only), is one  component of a comprehensive MRSA colonization surveillance program. It is not intended to diagnose MRSA infection nor to guide or monitor treatment for MRSA infections. Test performance is not FDA approved in patients less than 77 years old. Performed at [facility], [place] [address], [place], [place] [address]   Body fluid culture w Gram Stain     Status: None   Collection Time: 11/25/20  6:44 PM   Specimen: Pleura; Body Fluid  Result Value Ref Range Status   Specimen Description   Final    PLEURAL RIGHT Performed at [facility] [address], [place], [place] [address]    Special Requests   Final    NONE Performed at [facility], [place] [address] [place] [address] [place], [place] [address]    Gram Stain   Final    FEW WBC PRESENT, PREDOMINANTLY MONONUCLEAR NO ORGANISMS SEEN    Culture   Final    NO GROWTH Performed at [facility], [place] [address], [place], [place] [address]    Report Status 11/29/2020 FINAL  Final  Radiology Studies: DG CHEST PORT 1 VIEW  Result Date: 11/30/2020 CLINICAL DATA:  Bilateral pleural effusions and bibasilar atelectasis. EXAM: PORTABLE CHEST 1 VIEW COMPARISON:  11/28/2020 and CT chest 11/23/2020. FINDINGS: Trachea is midline. Heart is enlarged. Thoracic aorta is calcified. Bibasilar collapse/consolidation with bilateral pleural effusions. Left perihilar airspace consolidation. Scattered linear subsegmental atelectasis. Degenerative changes in the shoulders, left greater than right. IMPRESSION: 1. Bibasilar collapse/consolidation. Difficult to exclude pneumonia. 2. Bilateral pleural effusions, similar. 3. Left perihilar airspace consolidation may be infectious/inflammatory. Recommend attention on follow-up. 4. Scattered subsegmental atelectasis. Electronically Signed   By: Lorin Picket M.D.   On: 11/30/2020 08:11   DG CHEST PORT 1 VIEW  Result Date: 11/28/2020 CLINICAL DATA:  Shortness  of breath. 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 EXAM: PORTABLE CHEST 1 VIEW COMPARISON:  Chest x-ray 11/25/2020.  Chest CT 11/23/2020. FINDINGS: Cardiomediastinal silhouette appears enlarged unchanged. There are small bilateral pleural effusions, right greater than left. These are unchanged from prior. There is central pulmonary vascular congestion and bibasilar opacities. No pneumothorax. No acute fractures. IMPRESSION: 1. Cardiomegaly with central pulmonary vascular congestion 2. Stable bilateral pleural effusions and bibasilar atelectasis/airspace disease. Electronically Signed   By: Ronney Asters M.D.   On: 11/28/2020 16:22        Scheduled Meds:  aspirin EC  81 mg Oral Daily   atenolol  50 mg Oral BID   furosemide  60 mg Oral Daily   gabapentin  100 mg Oral QHS   hydrALAZINE  50 mg Oral TID   mouth rinse  15 mL Mouth Rinse BID   melatonin  10 mg Oral QHS   methimazole  2.5 mg Oral Daily   pantoprazole  40 mg Oral Q0600   simvastatin  20 mg Oral QHS   sodium chloride flush  3 mL Intravenous Q12H   sodium chloride flush  3 mL Intravenous Q12H   Continuous Infusions:  sodium chloride     sodium chloride     sodium chloride       LOS: 7 days    Time spent: 37 minutes spent on chart review, discussion with nursing staff, consultants, updating family and interview/physical exam; more than 50% of that time was spent in counseling and/or coordination of care.    Albana Saperstein J [place] DO [facility] Available via Epic secure chat 7am-7pm After these hours, please refer to coverage provider listed on [email or web address] 11/30/2020, 12:58 PM

## 2020-11-30 NOTE — Progress Notes (Signed)
[facility] at bedside, pt to transfer back to [place], Room 1437, safety maintained, report given to medics

## 2020-11-30 NOTE — Care Management Important Message (Signed)
Important Message  Patient Details IM Letter placed in Patients room. Name: Mandy [name] MRN: [id number] Date of Birth: 04/19/1940   Medicare Important Message Given:  Yes     Kerin Salen 11/30/2020, 11:48 AM

## 2020-11-30 NOTE — H&P (View-Only) (Signed)
Brief cardiology update note:  Pending L/RHC today. Consented, see note from Dr. Gardiner Rhyme. Will await results of cath to give further recommendations.  Buford [place], MD, PhD, [facility] at [facility] [address] [place], [place] [address] [phone]

## 2020-12-01 ENCOUNTER — Encounter (HOSPITAL_COMMUNITY): Payer: Self-pay | Admitting: Internal Medicine

## 2020-12-01 DIAGNOSIS — I272 Pulmonary hypertension, unspecified: Secondary | ICD-10-CM | POA: Diagnosis not present

## 2020-12-01 DIAGNOSIS — J9611 Chronic respiratory failure with hypoxia: Secondary | ICD-10-CM

## 2020-12-01 DIAGNOSIS — Z9981 Dependence on supplemental oxygen: Secondary | ICD-10-CM | POA: Diagnosis not present

## 2020-12-01 DIAGNOSIS — J9601 Acute respiratory failure with hypoxia: Secondary | ICD-10-CM | POA: Diagnosis not present

## 2020-12-01 DIAGNOSIS — J9602 Acute respiratory failure with hypercapnia: Secondary | ICD-10-CM | POA: Diagnosis not present

## 2020-12-01 LAB — BASIC METABOLIC PANEL
Anion gap: 8 (ref 5–15)
BUN: 16 mg/dL (ref 8–23)
CO2: 41 mmol/L — ABNORMAL HIGH (ref 22–32)
Calcium: 9.1 mg/dL (ref 8.9–10.3)
Chloride: 86 mmol/L — ABNORMAL LOW (ref 98–111)
Creatinine, Ser: 1.03 mg/dL — ABNORMAL HIGH (ref 0.44–1.00)
GFR, Estimated: 55 mL/min — ABNORMAL LOW (ref >60)
Glucose, Bld: 113 mg/dL — ABNORMAL HIGH (ref 70–99)
Potassium: 3.5 mmol/L (ref 3.5–5.1)
Sodium: 135 mmol/L (ref 135–145)

## 2020-12-01 MED ORDER — SODIUM CHLORIDE 0.9% FLUSH
10.0000 mL | INTRAVENOUS | Status: DC | PRN
  Administered 2020-12-01 (×2): 10 mL

## 2020-12-01 NOTE — Progress Notes (Signed)
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  Patient with history of pleural effusion with recent hospitalization and underwent thoracentesis at that time.  Patient was evaluated by cardiothoracic surgery, Dr. Kipp Brood last Friday and x-ray notable for enlarging right-sided pleural effusion and was referred to pulmonology, Dr. Valeta Harms for thoracentesis.  Due to her continued progressing shortness of breath, patient presented to ED for further evaluation.  Patient typically utilizes 2 L nasal cannula at baseline but had increased to 4 L morning of hospital presentation.  Also with reported orthopnea and requiring increased inclination for sleep.  Patient's daughter did give her an extra dose of Lasix night prior to ED presentation without any significant effect.  Denies any recent travel, or changes in diet.  No sick contacts.  In the ED, patient was found to have acute on chronic respiratory failure with hypercapnia and hypoxia.  Patient was placed on BiPAP.  BNP elevated 414.8.  Sodium 134, potassium 4.0, chloride 92, bicarb 35, creatinine 0.91.  Glucose 125.  Magnesium 2.0.  LFTs within normal limits.  Hospitalist consulted for further evaluation management of acute on chronic hypoxic/hypercapnic respiratory failure with recurrent right-sided pleural effusion.   Assessment & Plan:   Principal Problem:   Acute respiratory failure with hypoxia and hypercapnia (HCC) Active Problems:   Diabetes mellitus type 2, controlled (HCC)   Dyslipidemia   Essential hypertension   Hyponatremia    Hyperthyroidism   Pleural effusion on right   AKI (acute kidney injury) ([place])   Anemia   Acute on chronic heart failure with preserved ejection fraction (HFpEF) (HCC)   Chronic respiratory failure with hypoxia, on home oxygen therapy (HCC)   Anxiety   Severe pulmonary hypertension (HCC)   Chest pain of uncertain etiology   Acute on chronic hypoxic/hypercapnic respiratory failure, POA Patient presenting with progressive shortness of breath and found to be hypoxic and hypercapnic with PaCO2 85.0.  Patient initially started on BiPAP and slowly weaned off.  CT angiogram chest with no definite evidence of pulm embolism, moderate size right pleural effusion with associated ectasis right lower lobe, etiology likely secondary to recurrent pleural effusion. --Continue BiPAP nightly and while sleeping; continue to encourage compliance; but continues to adamantly decline --Will need outpatient sleep study --Continue supplemental oxygen, maintain SPO2 > 92%, currently on baseline 4L [place] at rest with SpO2 100% --Repeat VBG in the a.m.  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  Patient has had recurrent effusions since and required thoracentesis on 11/04/2020.  Was evaluated by interventional radiology on 11/24/2020 and felt that pocket was too small for thoracentesis.  Patient underwent right thoracentesis by pulmonology on 11/25/2020 with removal of 250 mL of amber appearing fluid.  Cytology with reactive mesothelial cells, atypical cells present. --PCCM signed off --Pleural fluid culture: No growth --Continue furosemide 60 mg p.o. daily --Repeat chest x-ray in a.m.  Acute on chronic diastolic congestive heart failure BNP elevated 414.8 with recurrent right pleural effusion as above.  Patient was initially started on IV diuresis, but creatinine has increased.  TTE with LVEF 55-60%, mild LVH, RV  systolic  function mildly reduced, elevated RVSP 83.2, biatrial enlargement, IVC dilated. Patient continues to refuse to wear BiPAP/CPAP, likely contributing to her progressive shortness of breath --net negative 1.2L past 24h and net negative 3.3L since admission --Wt 76.6>>79.4>76.5>76.5kg --Furosemide 60 mg p.o. daily --1500 mL fluid restriction --Strict I's and O's and daily weights --BMP daily  Chest pain Patient complaining of chest pain and progressive shortness of breath today.  Chest x-ray 11/26 with cardiomegaly, central pulmonary vascular congestion, stable bilateral pleural effusions.  EKG with NSR, rate 74, QTc 435, no concerning dynamic changes.  Improved with 3 doses nitroglycerin.  Underwent left heart catheterization 11/30/2020 with findings of mid LAD stenosis 30% and distal LAD stenosis 30% with LVEF 55-60%. --Cardiology following --Troponin 42>41; flat --Nitroglycerin as needed --Continue monitor on telemetry  Severe pulmonary hypertension TTE 11/22 with severely elevated RVSP of 83, IVC dilation, biatrial enlargement.  Right heart catheterization 11/28 with moderate pulmonary hypertension. --Cardiology following, awaiting further recommendations  Acute renal failure, not POA: Resolved Etiology likely secondary to overdiuresis with IV furosemide. --Cr 0.91>1.39>1.75>1.59>1.57>1.28>0.99>0.98>1.03 --Discontinued IV furosemide, transitioned back to oral Lasix as above --BMP daily  Essential hypertension --Atenolol 50 mg p.o. twice daily --Furosemide 60 mg p.o. daily --Hydralazine 50 mg p.o. 3 times daily --Continue aspirin and statin  Anemia, normocytic --Hgb 11.6>8.8>8.7>9.5 --MCV 83.8 --CBC daily  Hyponatremia Etiology likely secondary to hypervolemic hyponatremia in the setting of volume overload from underlying diastolic CHF. --BMP daily  Type 2 diabetes mellitus Hemoglobin A1c 5.9 on 11/03/2020, well controlled.  Diet controlled at home.  GERD: Protonix 40 mg p.o.  daily  Hx Hyperthyroidism TSH 2.871 11/25/2020. --Methimazole 2.5 mg p.o. daily  Neuropathy: Gabapentin 100 mg p.o. nightly  Anxiety: Diazepam 2 mg q12h PRN  Dyslipidemia --Simvastatin 20 mg p.o. nightly  Weakness/deconditioning: --PT currently recommending home health PT  --OT consult: Recommend home health currently  Ethics: Patient continues with tenuous respiratory status likely multifactorial with recurrent pleural effusion, severe/moderate pulmonary hypertension, and chronic hypoxic/hypercarbic respiratory failure in which she is noncompliant with CPAP/BiPAP due to toleration.  Given her lack of significant improvement and refusal of medical intervention; prognosis is poor.  We will have palliative care consult for assistance with further recommendations of medical decision making and goals of care.  DVT prophylaxis: Place and maintain sequential compression device Start: 11/23/20 1957   Code Status: Full Code Family Communication: No family present at bedside this morning, updated patient's daughter extensively yesterday afternoon.  Disposition Plan:  Level of care: Telemetry Status is: Inpatient  Remains inpatient appropriate because: Continue monitoring of renal function and respiratory status, pending further cardiology recommendations    Consultants:  Interventional radiology PCCM Cardiology Palliative care  Procedures:  Right thoracentesis 11/23, Dr. Verlee Monte PCCM Right/left heart catheterization 11/28  Antimicrobials:  None   Subjective: Patient seen examined bedside, lying in bed.  No family present this morning.  Continues to complain of chest tightness.  Did wear BiPAP occasionally overnight.  Chest x-ray with slight improvement of pulmonary edema.  Creatinine stable on increased dose of furosemide.  Pending right/left heart catheterization today.  No other questions or concerns at this time.  Denies headache, no fever/chills, no abdominal pain, no  palpitations, no nausea/vomiting/diarrhea.  No other acute concerns currently per nursing staff.  Objective: Vitals:   11/30/20 1925 11/30/20 2018 11/30/20 2353 12/01/20 0404  BP: (!) 149/43 (!) 148/50 (!) 125/111 (!) 152/78  Pulse: 90 84 85 83  Resp: (!) 24 (!) 22  19  Temp:  98.5 F (36.9 C) 98.4 F (36.9 C) 97.6 F (36.4 C)  TempSrc:  Oral Oral Oral  SpO2: 97% 99% 100% 100%  Weight:      Height:        Intake/Output Summary (Last 24 hours) at 12/01/2020 1058 Last data filed at 12/01/2020 0954 Gross per 24 hour  Intake 210 ml  Output 1300 ml  Net -1090 ml   Filed Weights   11/29/20 0500 11/29/20 1730 11/30/20 0452  Weight: 79.4 kg 76.5 kg 76.5 kg    Examination:  General exam: Appears calm and comfortable, ill in appearance Respiratory system: Coarse breath sounds bilaterally, decreased bilateral bases with crackles, mild inspiratory wheezing bilaterally. normal respiratory effort, on 4 L nasal cannula at rest (baseline 2-4L [place]) with SPO2 99% at rest Cardiovascular system: S1 & S2 heard, RRR. No JVD, murmurs, rubs, gallops or clicks. No pedal edema. Gastrointestinal system: Abdomen is nondistended, soft and nontender. No organomegaly or masses felt. Normal bowel sounds heard. Central nervous system: Alert and oriented. No focal neurological deficits. Extremities: Symmetric 5 x 5 power. Skin: No rashes, lesions or ulcers Psychiatry: Judgement and insight appear normal. Mood & affect appropriate.     Data Reviewed: I have personally reviewed following labs and imaging studies  CBC: Recent Labs  Lab 11/25/20 0306 11/26/20 0405 11/30/20 0422 11/30/20 1608 11/30/20 1629 11/30/20 1632  WBC 7.3 6.0 5.4  --   --   --   NEUTROABS 4.9  --   --   --   --   --   HGB 8.8* 8.7* 9.5* 10.9* 11.2* 11.2*  HCT 28.0* 28.0* 31.5* 32.0* 33.0* 33.0*  MCV 83.8 84.1 85.6  --   --   --   PLT 285 288 315  --   --   --    Basic Metabolic Panel: Recent Labs  Lab 11/25/20 0306  11/26/20 0405 11/27/20 0334 11/28/20 0402 11/29/20 0413 11/30/20 0422 11/30/20 1608 11/30/20 1629 11/30/20 1632 12/01/20 0506  NA 135   < > 135 131* 136 137 137 135 137 135  K 3.9   < > 3.9 4.1 4.3 4.0 3.3* 3.3* 3.2* 3.5  CL 92*   < > 92* 91* 92* 89*  --   --   --  86*  CO2 35*   < > 37* 35* 40* 41*  --   --   --  41*  GLUCOSE 103*   < > 110* 120* 108* 112*  --   --   --  113*  BUN 36*   < > 37* 28* 23 18  --   --   --  16  CREATININE 1.75*   < > 1.57* 1.28* 0.99 0.98  --   --   --  1.03*  CALCIUM 8.9   < > 8.5* 8.3* 8.7* 9.2  --   --   --  9.1  MG 2.2  --   --   --   --  2.2  --   --   --   --   PHOS 5.3*  --   --   --   --   --   --   --   --   --    < > = values in this interval not displayed.   GFR: Estimated Creatinine Clearance: 39.5 mL/min (A) (by C-G formula based on SCr of 1.03 mg/dL (H)). Liver Function Tests: Recent Labs  Lab 11/25/20 0306 11/25/20 2022  AST 15  --  ALT 11  --   ALKPHOS 33*  --   BILITOT 0.6  --   PROT 6.4* 6.2*  ALBUMIN 3.7  --    No results for input(s): LIPASE, AMYLASE in the last 168 hours. No results for input(s): AMMONIA in the last 168 hours. Coagulation Profile: No results for input(s): INR, PROTIME in the last 168 hours. Cardiac Enzymes: No results for input(s): CKTOTAL, CKMB, CKMBINDEX, TROPONINI in the last 168 hours. BNP (last 3 results) No results for input(s): PROBNP in the last 8760 hours. HbA1C: No results for input(s): HGBA1C in the last 72 hours. CBG: Recent Labs  Lab 11/26/20 1643  GLUCAP 147*   Lipid Profile: No results for input(s): CHOL, HDL, LDLCALC, TRIG, CHOLHDL, LDLDIRECT in the last 72 hours. Thyroid Function Tests: No results for input(s): TSH, T4TOTAL, FREET4, T3FREE, THYROIDAB in the last 72 hours.  Anemia Panel: No results for input(s): VITAMINB12, FOLATE, FERRITIN, TIBC, IRON, RETICCTPCT in the last 72 hours. Sepsis Labs: No results for input(s): PROCALCITON, LATICACIDVEN in the last 168  hours.  Recent Results (from the past 240 hour(s))  Resp Panel by RT-PCR (Flu A&B, Covid) Nasopharyngeal Swab     Status: None   Collection Time: 11/23/20 11:31 AM   Specimen: Nasopharyngeal Swab; Nasopharyngeal(NP) swabs in vial transport medium  Result Value Ref Range Status   SARS Coronavirus 2 by RT PCR NEGATIVE NEGATIVE Final    Comment: (NOTE) SARS-CoV-2 target nucleic acids are NOT DETECTED.  The SARS-CoV-2 RNA is generally detectable in upper respiratory specimens during the acute phase of infection. The lowest concentration of SARS-CoV-2 viral copies this assay can detect is 138 copies/mL. A negative result does not preclude SARS-Cov-2 infection and should not be used as the sole basis for treatment or other patient management decisions. A negative result may occur with  improper specimen collection/handling, submission of specimen other than nasopharyngeal swab, presence of viral mutation(s) within the areas targeted by this assay, and inadequate number of viral copies(<138 copies/mL). A negative result must be combined with clinical observations, patient history, and epidemiological information. The expected result is Negative.  Fact Sheet for Patients:  EntrepreneurPulse.com.au  Fact Sheet for Healthcare Providers:  IncredibleEmployment.be  This test is no t yet approved or cleared by the [place] FDA and  has been authorized for detection and/or diagnosis of SARS-CoV-2 by FDA under an Emergency Use Authorization (EUA). This EUA will remain  in effect (meaning this test can be used) for the duration of the COVID-19 declaration under Section 564(b)(1) of the Act, 21 U.S.C.section 360bbb-3(b)(1), unless the authorization is terminated  or revoked sooner.       Influenza A by PCR NEGATIVE NEGATIVE Final   Influenza B by PCR NEGATIVE NEGATIVE Final    Comment: (NOTE) The Xpert Xpress SARS-CoV-2/FLU/RSV plus assay is intended  as an aid in the diagnosis of influenza from Nasopharyngeal swab specimens and should not be used as a sole basis for treatment. Nasal washings and aspirates are unacceptable for Xpert Xpress SARS-CoV-2/FLU/RSV testing.  Fact Sheet for Patients: EntrepreneurPulse.com.au  Fact Sheet for Healthcare Providers: IncredibleEmployment.be  This test is not yet approved or cleared by the [place] FDA and has been authorized for detection and/or diagnosis of SARS-CoV-2 by FDA under an Emergency Use Authorization (EUA). This EUA will remain in effect (meaning this test can be used) for the duration of the COVID-19 declaration under Section 564(b)(1) of the Act, 21 U.S.C. section 360bbb-3(b)(1), unless the authorization is terminated or revoked.  Performed at [facility], [address] [place], [place] [address]   MRSA Next Gen by PCR, Nasal     Status: None   Collection Time: 11/24/20  4:15 PM   Specimen: Nasal Mucosa; Nasal Swab  Result Value Ref Range Status   MRSA by PCR Next Gen NOT DETECTED NOT DETECTED Final    Comment: (NOTE) The GeneXpert MRSA Assay (FDA approved for NASAL specimens only), is one component of a comprehensive MRSA colonization surveillance program. It is not intended to diagnose MRSA infection nor to guide or monitor treatment for MRSA infections. Test performance is not FDA approved in patients less than 77 years old. Performed at [facility], [place] [address] [place], [place] [address]   Body fluid culture w Gram Stain     Status: None   Collection Time: 11/25/20  6:44 PM   Specimen: Pleura; Body Fluid  Result Value Ref Range Status   Specimen Description   Final    PLEURAL RIGHT Performed at [facility] [name] [place], [place] [address]    Special Requests   Final    NONE Performed at [facility], [place] [address],  [place], [place] [address]    Gram Stain   Final    FEW WBC PRESENT, PREDOMINANTLY MONONUCLEAR NO ORGANISMS SEEN    Culture   Final    NO GROWTH Performed at [facility], [place] [address] [place], [place] [address]    Report Status 11/29/2020 FINAL  Final         Radiology Studies: CARDIAC CATHETERIZATION  Result Date: 11/30/2020   Mid LAD lesion is 30% stenosed.   Dist LAD lesion is 30% stenosed.   LV end diastolic pressure is low.   LV end diastolic pressure is normal.   The left ventricular ejection fraction is 55-65% by visual estimate.   Hemodynamic findings consistent with moderate pulmonary hypertension. 1.  Mild obstructive coronary artery disease. 2.  Preserved cardiac output of 6.1 L/min and an index of 3.55 L/min/m. 3.  Mean pulmonary artery pressure of 39 mmHg with pulmonary vascular resistance of 3.44 [place] units; transpulmonary gradient of 70mmHg (mean wedge pressure 67mmHg) 4.  LVEDP of 9 mmHg with preserved LVEF. Recommendation: Medical therapy.   DG CHEST PORT 1 VIEW  Result Date: 11/30/2020 CLINICAL DATA:  Bilateral pleural effusions and bibasilar atelectasis. EXAM: PORTABLE CHEST 1 VIEW COMPARISON:  11/28/2020 and CT chest 11/23/2020. FINDINGS: Trachea is midline. Heart is enlarged. Thoracic aorta is calcified. Bibasilar collapse/consolidation with bilateral pleural effusions. Left perihilar airspace consolidation. Scattered linear subsegmental atelectasis. Degenerative changes in the shoulders, left greater than right. IMPRESSION: 1. Bibasilar collapse/consolidation. Difficult to exclude pneumonia. 2. Bilateral pleural effusions, similar. 3. Left perihilar airspace consolidation may be infectious/inflammatory. Recommend attention on follow-up. 4. Scattered subsegmental atelectasis. Electronically Signed   By: Lorin Picket M.D.   On: 11/30/2020 08:11        Scheduled Meds:  aspirin EC  81 mg Oral Daily   atenolol  50 mg Oral BID   Chlorhexidine Gluconate  Cloth  6 each Topical Daily   furosemide  60 mg Oral Daily   gabapentin  100 mg Oral QHS   hydrALAZINE  50 mg Oral TID   mouth rinse  15 mL Mouth Rinse BID   melatonin  10 mg Oral QHS   methimazole  2.5 mg Oral Daily   pantoprazole  40 mg Oral Q0600   simvastatin  20  mg Oral QHS   sodium chloride flush  3 mL Intravenous Q12H   sodium chloride flush  3 mL Intravenous Q12H   sodium chloride flush  3 mL Intravenous Q12H   Continuous Infusions:  sodium chloride     sodium chloride       LOS: 8 days    Time spent: 38 minutes spent on chart review, discussion with nursing staff, consultants, updating family and interview/physical exam; more than 50% of that time was spent in counseling and/or coordination of care.    Salia Cangemi J [place] DO [facility] Available via Epic secure chat 7am-7pm After these hours, please refer to coverage provider listed on [email or web address] 12/01/2020, 10:58 AM

## 2020-12-01 NOTE — Progress Notes (Signed)
Physical Therapy Treatment Patient Details Name: Mandy [name] MRN: [id number] DOB: 03/28/1940 Today's Date: 12/01/2020   History of Present Illness 80yo female who presented on 11/21 with increased SOB and DOE, difficulty maintaining SpO2 in the 90s with activity. Of note, she had a recent hospitalization for pleural effusion and SOB. Admitted with acute respiratory failure with hypoxia and hypercapnia, R pleural effusion, and acute on chronic heart failure. PMH OA, DM, HLD, HTN, CHF, osteoporosis    PT Comments    Progressing with mobility. Pt was drowsy during session. With encouragement, she was eventually agreeable to attempt ambulation. She walked ~40 feet on 6L: O2 96% (pt was on 8L on wall O2-family reports nursing increased it 2* drop in sats earlier today). Pt fatigued easily with ambulation. Will continue to follow and progress activity as tolerated.    Recommendations for follow up therapy are one component of a multi-disciplinary discharge planning process, led by the attending physician.  Recommendations may be updated based on patient status, additional functional criteria and insurance authorization.  Follow Up Recommendations  Home health PT     Assistance Recommended at Discharge Frequent or constant Supervision/Assistance  Equipment Recommendations  BSC/3in1;Wheelchair;Wheelchair cushion ;Conservation officer, nature (2 wheels)    Recommendations for Other Services       Precautions / Restrictions Precautions Precautions: Fall Precaution Comments: watch O2/HR/RR Restrictions Weight Bearing Restrictions: No     Mobility  Bed Mobility               General bed mobility comments: oob in recliner    Transfers Overall transfer level: Needs assistance Equipment used: Rolling walker (2 wheels) Transfers: Sit to/from Stand Sit to Stand: Min guard           General transfer comment: Pt stood from EOB to RW with Min guard assist for safety.     Ambulation/Gait Ambulation/Gait assistance: Min assist;+2 safety/equipment Gait Distance (Feet): 40 Feet Assistive device: Rolling walker (2 wheels) Gait Pattern/deviations: Step-through pattern;Decreased stride length       General Gait Details: Unsteady and tends to keep RW too far ahead. O2 96% on 6L. Dyspnea + fatigue with ambulation. Followed with recliner and used it to transport pt back to the room.   Stairs             Wheelchair Mobility    Modified Rankin (Stroke Patients Only)       Balance Overall balance assessment: Needs assistance         Standing balance support: During functional activity;Reliant on assistive device for balance Standing balance-Leahy Scale: Poor                              Cognition Arousal/Alertness: Awake/alert Behavior During Therapy: WFL for tasks assessed/performed Overall Cognitive Status: Within Functional Limits for tasks assessed                                          Exercises      General Comments        Pertinent Vitals/Pain Pain Assessment: Faces Faces Pain Scale: No hurt    Home Living                          Prior Function            PT Goals (  current goals can now be found in the care plan section) Progress towards PT goals: Progressing toward goals    Frequency    Min 3X/week      PT Plan Current plan remains appropriate    Co-evaluation              AM-PAC PT "6 Clicks" Mobility   Outcome Measure  Help needed turning from your back to your side while in a flat bed without using bedrails?: A Little Help needed moving from lying on your back to sitting on the side of a flat bed without using bedrails?: A Little Help needed moving to and from a bed to a chair (including a wheelchair)?: A Little Help needed standing up from a chair using your arms (e.g., wheelchair or bedside chair)?: A Little Help needed to walk in hospital room?: A  Lot Help needed climbing 3-5 steps with a railing? : Total 6 Click Score: 15    End of Session Equipment Utilized During Treatment: Gait belt;Oxygen Activity Tolerance: Patient limited by fatigue Patient left: in chair;with call bell/phone within reach;with chair alarm set;with family/visitor present   PT Visit Diagnosis: Difficulty in walking, not elsewhere classified (R26.2);Muscle weakness (generalized) (M62.81)     Time: 7654-6503 PT Time Calculation (min) (ACUTE ONLY): 20 min  Charges:  $Gait Training: 8-22 mins              Doreatha Massed, PT Acute Rehabilitation  Office: [phone] Pager: [phone]

## 2020-12-01 NOTE — Progress Notes (Signed)
Progress Note  Patient Name: Mandy [name] Date of Encounter: 12/01/2020  [facility] Cardiologist: Donato Heinz, MD   Subjective   Daughter at bedside, serves as interpreter as dialect in the translation service is not accurate. No acute events overnight. Reviewed results of cath. No current new concerns.  Inpatient Medications    Scheduled Meds:  aspirin EC  81 mg Oral Daily   atenolol  50 mg Oral BID   Chlorhexidine Gluconate Cloth  6 each Topical Daily   furosemide  60 mg Oral Daily   gabapentin  100 mg Oral QHS   hydrALAZINE  50 mg Oral TID   mouth rinse  15 mL Mouth Rinse BID   melatonin  10 mg Oral QHS   methimazole  2.5 mg Oral Daily   pantoprazole  40 mg Oral Q0600   simvastatin  20 mg Oral QHS   sodium chloride flush  3 mL Intravenous Q12H   Continuous Infusions:  sodium chloride     PRN Meds: sodium chloride, acetaminophen, albuterol, alum & mag hydroxide-simeth, diazepam, lip balm, nitroGLYCERIN, ondansetron (ZOFRAN) IV, senna-docusate, sodium chloride flush   Vital Signs    Vitals:   11/30/20 2018 11/30/20 2353 12/01/20 0404 12/01/20 1115  BP: (!) 148/50 (!) 125/111 (!) 152/78 127/70  Pulse: 84 85 83 72  Resp: (!) 22  19 18   Temp: 98.5 F (36.9 C) 98.4 F (36.9 C) 97.6 F (36.4 C) 97.9 F (36.6 C)  TempSrc: Oral Oral Oral Oral  SpO2: 99% 100% 100% 100%  Weight:      Height:        Intake/Output Summary (Last 24 hours) at 12/01/2020 1348 Last data filed at 12/01/2020 0954 Gross per 24 hour  Intake 210 ml  Output 1300 ml  Net -1090 ml   Last 3 Weights 11/30/2020 11/29/2020 11/29/2020  Weight (lbs) 168 lb 11.2 oz 168 lb 10.4 oz 175 lb  Weight (kg) 76.522 kg 76.5 kg 79.379 kg      Telemetry    SR - Personally Reviewed  ECG    No new since 11/27 - Personally Reviewed  Physical Exam   GEN: No acute distress.  Sitting comfortably in chair, [place] in place Neck: No JVD visible sitting upright Cardiac: RRR, no murmurs, rubs,  or gallops.  Respiratory: distant throughout, faint intermittent wheezing GI: Soft, nontender, non-distended  MS: No edema; No deformity. Neuro:  Nonfocal  Psych: Normal affect   Labs    High Sensitivity Troponin:   Recent Labs  Lab 11/03/20 1241 11/03/20 1843 11/29/20 1022 11/29/20 1250  TROPONINIHS 35* 37* 42* 41*     Chemistry Recent Labs  Lab 11/25/20 0306 11/25/20 2022 11/26/20 0405 11/29/20 0413 11/30/20 0422 11/30/20 1608 11/30/20 1629 11/30/20 1632 12/01/20 0506  NA 135  --    < > 136 137   < > 135 137 135  K 3.9  --    < > 4.3 4.0   < > 3.3* 3.2* 3.5  CL 92*  --    < > 92* 89*  --   --   --  86*  CO2 35*  --    < > 40* 41*  --   --   --  41*  GLUCOSE 103*  --    < > 108* 112*  --   --   --  113*  BUN 36*  --    < > 23 18  --   --   --  16  CREATININE 1.75*  --    < > 0.99 0.98  --   --   --  1.03*  CALCIUM 8.9  --    < > 8.7* 9.2  --   --   --  9.1  MG 2.2  --   --   --  2.2  --   --   --   --   PROT 6.4* 6.2*  --   --   --   --   --   --   --   ALBUMIN 3.7  --   --   --   --   --   --   --   --   AST 15  --   --   --   --   --   --   --   --   ALT 11  --   --   --   --   --   --   --   --   ALKPHOS 33*  --   --   --   --   --   --   --   --   BILITOT 0.6  --   --   --   --   --   --   --   --   GFRNONAA 29*  --    < > 58* 59*  --   --   --  55*  ANIONGAP 8  --    < > 4* 7  --   --   --  8   < > = values in this interval not displayed.    Lipids No results for input(s): CHOL, TRIG, HDL, LABVLDL, LDLCALC, CHOLHDL in the last 168 hours.  Hematology Recent Labs  Lab 11/25/20 0306 11/26/20 0405 11/30/20 0422 11/30/20 1608 11/30/20 1629 11/30/20 1632  WBC 7.3 6.0 5.4  --   --   --   RBC 3.34* 3.33* 3.68*  --   --   --   HGB 8.8* 8.7* 9.5* 10.9* 11.2* 11.2*  HCT 28.0* 28.0* 31.5* 32.0* 33.0* 33.0*  MCV 83.8 84.1 85.6  --   --   --   MCH 26.3 26.1 25.8*  --   --   --   MCHC 31.4 31.1 30.2  --   --   --   RDW 16.2* 16.0* 15.9*  --   --   --   PLT  285 288 315  --   --   --    Thyroid  Recent Labs  Lab 11/25/20 0955  TSH 2.871    BNPNo results for input(s): BNP, PROBNP in the last 168 hours.  DDimer No results for input(s): DDIMER in the last 168 hours.   Radiology    CARDIAC CATHETERIZATION  Result Date: 11/30/2020   Mid LAD lesion is 30% stenosed.   Dist LAD lesion is 30% stenosed.   LV end diastolic pressure is low.   LV end diastolic pressure is normal.   The left ventricular ejection fraction is 55-65% by visual estimate.   Hemodynamic findings consistent with moderate pulmonary hypertension. 1.  Mild obstructive coronary artery disease. 2.  Preserved cardiac output of 6.1 L/min and an index of 3.55 L/min/m. 3.  Mean pulmonary artery pressure of 39 mmHg with pulmonary vascular resistance of 3.44 Woods units; transpulmonary gradient of 57mmHg (mean wedge pressure 77mmHg) 4.  LVEDP of 9 mmHg with preserved LVEF. Recommendation: Medical therapy.  DG CHEST PORT 1 VIEW  Result Date: 11/30/2020 CLINICAL DATA:  Bilateral pleural effusions and bibasilar atelectasis. EXAM: PORTABLE CHEST 1 VIEW COMPARISON:  11/28/2020 and CT chest 11/23/2020. FINDINGS: Trachea is midline. Heart is enlarged. Thoracic aorta is calcified. Bibasilar collapse/consolidation with bilateral pleural effusions. Left perihilar airspace consolidation. Scattered linear subsegmental atelectasis. Degenerative changes in the shoulders, left greater than right. IMPRESSION: 1. Bibasilar collapse/consolidation. Difficult to exclude pneumonia. 2. Bilateral pleural effusions, similar. 3. Left perihilar airspace consolidation may be infectious/inflammatory. Recommend attention on follow-up. 4. Scattered subsegmental atelectasis. Electronically Signed   By: Lorin Picket M.D.   On: 11/30/2020 08:11    Cardiac Studies   [facility] 11/30/20   Mid LAD lesion is 30% stenosed.   Dist LAD lesion is 30% stenosed.   LV end diastolic pressure is low.   LV end diastolic pressure is  normal.   The left ventricular ejection fraction is 55-65% by visual estimate.   Hemodynamic findings consistent with moderate pulmonary hypertension.   1.  Mild obstructive coronary artery disease. 2.  Preserved cardiac output of 6.1 L/min and an index of 3.55 L/min/m. 3.  Mean pulmonary artery pressure of 39 mmHg with pulmonary vascular resistance of 3.44 Woods units; transpulmonary gradient of 33mmHg (mean wedge pressure 87mmHg) 4.  LVEDP of 9 mmHg with preserved LVEF.   Recommendation: Medical therapy.  Echo 11/24/20 1. Left ventricular ejection fraction, by estimation, is 55 to 60%. The left ventricle has normal function. There is mild left ventricular hypertrophy.   2. Right ventricular systolic function is mildly reduced. The right ventricular size is normal. There is severely elevated pulmonary artery systolic pressure. The estimated right ventricular systolic pressure is 29.5 mmHg.   3. Left atrial size was mild to moderately dilated.   4. Right atrial size was mildly dilated.   5. The mitral valve is normal in structure. Mild to moderate mitral valve  regurgitation. No evidence of mitral stenosis.   6. The aortic valve is tricuspid. Aortic valve regurgitation is not visualized. No aortic stenosis is present.   7. The inferior vena cava is dilated in size with <50% respiratory variability, suggesting right atrial pressure of 15 mmHg.   8. Left pleural effusion noted.   Patient Profile     80 y.o. 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 -filling pressures are optimized currently based on cath numbers. -stable on furosemide 60 mg daily  Essential hypertension -on atenolol, hydralazine -typically has been well controlled (127/70 currently), but has intermittent spikes to 215/96 yesterday PM post cath (returned to [facility] about 8 PM).    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   AKI: Creatinine increased from 0.9 up to 1.75, now 1.03  [facility] will sign off.   Medication Recommendations:   Aspirin 81 mg daily Atenolol 50  mg BID Furosemide 60 mg daily Hydralazine 50 mg three times daily Simvastatin 20 mg daily Other recommendations (labs, testing, etc):  none Follow up as an outpatient:  We will arrange for follow up with Dr. Gardiner Rhyme or a member of his team.  For questions or updates, please contact [facility] Please consult [email or web address] for contact info under        Signed, Buford Dresser, MD  12/01/2020, 1:48 PM

## 2020-12-01 NOTE — Progress Notes (Signed)
TL CVC dressing with old bloody drainage. Dressing changed. Skin irritation noted under perimeter of dressing. Insertion site unremarkable. No IV meds noted. Please consider central line removal.

## 2020-12-01 NOTE — Telephone Encounter (Signed)
Called pt via pacific interpreters:  INTERPUTER IS DAUGHTER-Creole "English side" Pharmacist, community unavailable, use Horticulturist, commercial or Language Line [phone] Access code [phone]  There were no [place] interpreters available when I called  Called her daughter, [name] and [place]

## 2020-12-02 ENCOUNTER — Institutional Professional Consult (permissible substitution): Payer: Medicare HMO | Admitting: Pulmonary Disease

## 2020-12-02 ENCOUNTER — Inpatient Hospital Stay (HOSPITAL_COMMUNITY): Payer: Medicare HMO

## 2020-12-02 DIAGNOSIS — J9602 Acute respiratory failure with hypercapnia: Secondary | ICD-10-CM | POA: Diagnosis not present

## 2020-12-02 DIAGNOSIS — J9601 Acute respiratory failure with hypoxia: Secondary | ICD-10-CM | POA: Diagnosis not present

## 2020-12-02 DIAGNOSIS — Z7189 Other specified counseling: Secondary | ICD-10-CM

## 2020-12-02 DIAGNOSIS — Z515 Encounter for palliative care: Secondary | ICD-10-CM

## 2020-12-02 LAB — BLOOD GAS, VENOUS
Acid-Base Excess: 14 mmol/L — ABNORMAL HIGH (ref 0.0–2.0)
Bicarbonate: 41.5 mmol/L — ABNORMAL HIGH (ref 20.0–28.0)
O2 Saturation: 64.5 %
Patient temperature: 98.3
pCO2, Ven: 78.5 mmHg (ref 44.0–60.0)
pH, Ven: 7.342 (ref 7.250–7.430)
pO2, Ven: 39.2 mmHg (ref 32.0–45.0)

## 2020-12-02 LAB — CBC
HCT: 26.8 % — ABNORMAL LOW (ref 36.0–46.0)
Hemoglobin: 8.6 g/dL — ABNORMAL LOW (ref 12.0–15.0)
MCH: 25.8 pg — ABNORMAL LOW (ref 26.0–34.0)
MCHC: 32.1 g/dL (ref 30.0–36.0)
MCV: 80.5 fL (ref 80.0–100.0)
Platelets: 235 10*3/uL (ref 150–400)
RBC: 3.33 MIL/uL — ABNORMAL LOW (ref 3.87–5.11)
RDW: 15.9 % — ABNORMAL HIGH (ref 11.5–15.5)
WBC: 6 10*3/uL (ref 4.0–10.5)
nRBC: 0 % (ref 0.0–0.2)

## 2020-12-02 LAB — BASIC METABOLIC PANEL
Anion gap: 7 (ref 5–15)
BUN: 21 mg/dL (ref 8–23)
CO2: 37 mmol/L — ABNORMAL HIGH (ref 22–32)
Calcium: 8.3 mg/dL — ABNORMAL LOW (ref 8.9–10.3)
Chloride: 86 mmol/L — ABNORMAL LOW (ref 98–111)
Creatinine, Ser: 1.33 mg/dL — ABNORMAL HIGH (ref 0.44–1.00)
GFR, Estimated: 41 mL/min — ABNORMAL LOW (ref >60)
Glucose, Bld: 164 mg/dL — ABNORMAL HIGH (ref 70–99)
Potassium: 3.5 mmol/L (ref 3.5–5.1)
Sodium: 130 mmol/L — ABNORMAL LOW (ref 135–145)

## 2020-12-02 LAB — MAGNESIUM: Magnesium: 2 mg/dL (ref 1.7–2.4)

## 2020-12-02 LAB — CHOLESTEROL, BODY FLUID: Cholesterol, Fluid: 44 mg/dL

## 2020-12-02 MED FILL — Verapamil HCl IV Soln 2.5 MG/ML: INTRAVENOUS | Qty: 2 | Status: AC

## 2020-12-02 NOTE — Plan of Care (Signed)
  Problem: Clinical Measurements: Goal: Ability to maintain clinical measurements within normal limits will improve Outcome: Progressing Goal: Will remain free from infection Outcome: Progressing Goal: Diagnostic test results will improve Outcome: Progressing Goal: Cardiovascular complication will be avoided Outcome: Progressing   Problem: Activity: Goal: Risk for activity intolerance will decrease Outcome: Progressing   Problem: Nutrition: Goal: Adequate nutrition will be maintained Outcome: Progressing   Problem: Coping: Goal: Level of anxiety will decrease Outcome: Progressing   Problem: Elimination: Goal: Will not experience complications related to bowel motility Outcome: Progressing Goal: Will not experience complications related to urinary retention Outcome: Progressing   Problem: Pain Managment: Goal: General experience of comfort will improve Outcome: Progressing   Problem: Education: Goal: Knowledge of General Education information will improve Description: Including pain rating scale, medication(s)/side effects and non-pharmacologic comfort measures Outcome: Not Progressing   Problem: Health Behavior/Discharge Planning: Goal: Ability to manage health-related needs will improve Outcome: Not Progressing   Problem: Clinical Measurements: Goal: Respiratory complications will improve Outcome: Not Progressing

## 2020-12-02 NOTE — Progress Notes (Signed)
Occupational Therapy Treatment Patient Details Name: Mandy [name] MRN: [id number] DOB: 07-10-1940 Today's Date: 12/02/2020   History of present illness 80yo female who presented on 11/21 with increased SOB and DOE, difficulty maintaining SpO2 in the 90s with activity. Of note, she had a recent hospitalization for pleural effusion and SOB. Admitted with acute respiratory failure with hypoxia and hypercapnia, R pleural effusion, and acute on chronic heart failure. PMH OA, DM, HLD, HTN, CHF, osteoporosis   OT comments  Patient resting in bed, daughter present.  Patient with nice progress compared to evaluation status.  Patient needing up to [facility] and safety cues for bed mobility and transfer to recliner.  Needing setup only seated for grooming and self feeding.  Daughter is very doting toward her mother, and generally looks to help rather than allowing the patient to attempt.  Patient continues to present with poor activity tolerance, HR up to 96 BPM, and O2 was reduced to 4L this date.  OT to continue efforts in the acute setting, but [facility] OT is recommended post acute.     Recommendations for follow up therapy are one component of a multi-disciplinary discharge planning process, led by the attending physician.  Recommendations may be updated based on patient status, additional functional criteria and insurance authorization.    Follow Up Recommendations  Home health OT    Assistance Recommended at Discharge Intermittent Supervision/Assistance  Equipment Recommendations       Recommendations for Other Services      Precautions / Restrictions Precautions Precautions: Fall Precaution Comments: watch O2/HR/RR Restrictions Weight Bearing Restrictions: No       Mobility Bed Mobility Overal bed mobility: Needs Assistance Bed Mobility: Supine to Sit     Supine to sit: Supervision     General bed mobility comments: patient trying to stand an move to the recliner without waiting for O2  to be switched to tank from the wall. Patient Response: Cooperative  Transfers   Equipment used: Conservation officer, nature (2 wheels) Transfers: Sit to/from Stand;Bed to chair/wheelchair/BSC Sit to Stand: Min guard   Step pivot transfers: Min guard             Balance Overall balance assessment: Needs assistance Sitting-balance support: Feet supported Sitting balance-Leahy Scale: Good     Standing balance support: Reliant on assistive device for balance Standing balance-Leahy Scale: Poor                             ADL either performed or assessed with clinical judgement   ADL   Eating/Feeding: Set up;Sitting   Grooming: Wash/dry hands;Wash/dry face;Oral care;Set up;Sitting                   Toilet Transfer: Min guard;Regular Toilet;Rolling walker (2 wheels)   Toileting- Water quality scientist and Hygiene: Min guard;Sit to/from stand Toileting - Clothing Manipulation Details (indicate cue type and reason): daughter removed pruewick for bathroom usage            Extremity/Trunk Assessment                                Cognition Arousal/Alertness: Awake/alert Behavior During Therapy: WFL for tasks assessed/performed Overall Cognitive Status: Within Functional Limits for tasks assessed  General Comments: daughter at bedside, states mother is at Mandy [name] for cognition          Exercises     Shoulder Instructions       General Comments      Pertinent Vitals/ Pain       Pain Assessment: No/denies pain Pain Intervention(s): Monitored during session                                                          Frequency  Min 2X/week        Progress Toward Goals  OT Goals(current goals can now be found in the care plan section)     Acute Rehab OT Goals Patient Stated Goal: Home when stable OT Goal Formulation: With family Time For Goal Achievement:  12/13/20 Potential to Achieve Goals: Good  Plan Discharge plan remains appropriate    Co-evaluation                 AM-PAC OT "6 Clicks" Daily Activity     Outcome Measure   Help from another person eating meals?: None Help from another person taking care of personal grooming?: None Help from another person toileting, which includes using toliet, bedpan, or urinal?: A Little Help from another person bathing (including washing, rinsing, drying)?: A Lot Help from another person to put on and taking off regular upper body clothing?: A Little Help from another person to put on and taking off regular lower body clothing?: A Lot 6 Click Score: 18    End of Session Equipment Utilized During Treatment: Rolling walker (2 wheels);Oxygen  OT Visit Diagnosis: Unsteadiness on feet (R26.81);Pain;Muscle weakness (generalized) (M62.81)   Activity Tolerance Patient tolerated treatment well   Patient Left in chair;with call bell/phone within reach;with chair alarm set;with family/visitor present   Nurse Communication          Time: 1140-1158 OT Time Calculation (min): 18 min  Charges: OT General Charges $OT Visit: 1 Visit OT Treatments $Self Care/Home Management : 8-22 mins  12/02/2020  RP, OTR/L  Acute Rehabilitation Services  Office:  [phone]   Metta Clines 12/02/2020, 12:05 PM

## 2020-12-02 NOTE — Progress Notes (Signed)
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  Patient with history of pleural effusion with recent hospitalization and underwent thoracentesis at that time.  Patient was evaluated by cardiothoracic surgery, Dr. Kipp Brood last Friday and x-ray notable for enlarging right-sided pleural effusion and was referred to pulmonology, Dr. Valeta Harms for thoracentesis.  Due to her continued progressing shortness of breath, patient presented to ED for further evaluation.  Patient typically utilizes 2 L nasal cannula at baseline but had increased to 4 L morning of hospital presentation.  Also with reported orthopnea and requiring increased inclination for sleep.  Patient's daughter did give her an extra dose of Lasix night prior to ED presentation without any significant effect.  Denies any recent travel, or changes in diet.  No sick contacts.   In the ED, patient was found to have acute on chronic respiratory failure with hypercapnia and hypoxia.  Patient was placed on BiPAP.  BNP elevated 414.8.  Sodium 134, potassium 4.0, chloride 92, bicarb 35, creatinine 0.91.  Glucose 125.  Magnesium 2.0.  LFTs within normal limits.  Hospitalist consulted for further evaluation management of acute on chronic hypoxic/hypercapnic respiratory failure with recurrent right-sided pleural effusion.  Assessment & Plan:   Principal Problem:   Acute respiratory failure with hypoxia and hypercapnia (HCC) Active Problems:   Diabetes mellitus type 2, controlled (HCC)   Dyslipidemia   Essential hypertension   Hyponatremia   Hyperthyroidism    Pleural effusion on right   AKI (acute kidney injury) ([place])   Anemia   Acute on chronic heart failure with preserved ejection fraction (HFpEF) (HCC)   Chronic respiratory failure with hypoxia, on home oxygen therapy (HCC)   Anxiety   Severe pulmonary hypertension (HCC)   Chest pain of uncertain etiology   #1 acute hypoxic hypercapnic respiratory failure/recurrent right pleural effusion-patient was found to be hypercapnic at 85 and has been on and off BiPAP. Continue BiPAP at night and as needed sleep daughter reports that she seems to be tolerating BiPAP okay at night CT chest shows no evidence of PE but moderate right pleural effusion with atelectasis of the right lower lobe  She was followed by PCCM who signed off.  Status post thoracentesis 11/04/2020 She is status post resection of anterior mediastinal mass in September 2022 since then she has had recurrent effusions. She had thoracentesis x2 first 1 was on 11/04/2020 and the second 1 was on 11/25/2020. Pleural fluid culture with no growth.  On Lasix 60 mg daily.  #2 acute on chronic diastolic heart failure with an ejection fraction 55 to 60%.  Creatinine has been trending up and now trending down with IV diuresis. Followed by cardiology signed off 12/02/2020 On fluid restriction 1500 mL/day Patient also has severe pulmonary hypertension right ventricular systolic pressure of 83 by transthoracic echo Cath 11/30/2020 with moderate pulmonary hypertension Cardiology recommends aspirin 81 mg daily, atenolol 50 mg twice daily, Lasix 60 mg daily, hydralazine 50 mg 3 times daily, simvastatin 20 mg daily.  #3 AKI likely secondary to diuresis monitor closely  #4 history of essential hypertension continue atenolol Lasix and hydralazine  #5 anemia of chronic disease no active or acute bleeding  noted.  #6 hypervolemic hyponatremia in the setting of CHF stable with diuresis  #7 type 2 diabetes continue SSI not on any medications at home her  hemoglobin A1c was 5.9 on 11/03/2020  #8 history of hyperthyroidism on methimazole  #9 neuropathy on gabapentin  #10 anxiety on diazepam  #11 hyperlipidemia on statin  #12 goals of care patient is a full code at this time.  However has poor prognosis with recurrent pleural effusion and severe/moderate pulmonary hypertension and chronic hypoxic hypercapnic respiratory failure.  Patient is on 2 L of oxygen at home 24/7.  Palliative care has been consulted.  #13 deconditioning PT OT currently recommending home health patient lives at home with her family.  Estimated body mass index is 33.57 kg/m as calculated from the following:   Height as of this encounter: 4\' 11"  (1.499 m).   Weight as of this encounter: 75.4 kg.  DVT prophylaxis: scd Code Status: full Family Communication: Daughter at the bedside  disposition Plan:  Status is: Inpatient  Remains inpatient appropriate because: iv treatments   Consultants:  Pccm cards  Procedures: Right thoracentesis 11/25/2020 Right and left heart cath 11/30/2020 Antimicrobials: None  Subjective: Patient resting in bed in no acute distress her daughter is at the bedside she wore BiPAP overnight  Objective: Vitals:   12/01/20 2110 12/01/20 2334 12/02/20 0500 12/02/20 0600  BP: 136/85   (!) 151/68  Pulse: 63   65  Resp: 18 (!) 24  16  Temp: 98.8 F (37.1 C)   98.4 F (36.9 C)  TempSrc: Oral   Axillary  SpO2: 100%   100%  Weight:   75.4 kg   Height:        Intake/Output Summary (Last 24 hours) at 12/02/2020 1313 Last data filed at 12/02/2020 0942 Gross per 24 hour  Intake 240 ml  Output 500 ml  Net -260 ml   Filed Weights   11/29/20 1730 11/30/20 0452 12/02/20 0500  Weight: 76.5 kg 76.5 kg 75.4 kg    Examination:  General exam: Appears calm and comfortable  Respiratory system: Coarse bs to auscultation. Respiratory effort normal. Cardiovascular system: S1 & S2 heard, RRR. No JVD, murmurs, rubs, gallops or clicks. No  pedal edema. Gastrointestinal system: Abdomen is nondistended, soft and nontender. No organomegaly or masses felt. Normal bowel sounds heard. Central nervous system: Alert and oriented. No focal neurological deficits. Extremities:no edema Skin: No rashes, lesions or ulcers Psychiatry: Judgement and insight appear normal. Mood & affect appropriate.     Data Reviewed: I have personally reviewed following labs and imaging studies  CBC: Recent Labs  Lab 11/26/20 0405 11/30/20 0422 11/30/20 1608 11/30/20 1629 11/30/20 1632 12/02/20 1046  WBC 6.0 5.4  --   --   --  6.0  HGB 8.7* 9.5* 10.9* 11.2* 11.2* 8.6*  HCT 28.0* 31.5* 32.0* 33.0* 33.0* 26.8*  MCV 84.1 85.6  --   --   --  80.5  PLT 288 315  --   --   --  528   Basic Metabolic Panel: Recent Labs  Lab 11/28/20 0402 11/29/20 0413 11/30/20 0422 11/30/20 1608 11/30/20 1629 11/30/20 1632 12/01/20 0506 12/02/20 1046  NA 131* 136 137 137 135 137 135 130*  K 4.1 4.3 4.0 3.3* 3.3* 3.2* 3.5 3.5  CL 91* 92* 89*  --   --   --  86* 86*  CO2 35* 40* 41*  --   --   --  41* 37*  GLUCOSE 120* 108* 112*  --   --   --  113* 164*  BUN 28* 23 18  --   --   --  16 21  CREATININE 1.28* 0.99 0.98  --   --   --  1.03* 1.33*  CALCIUM 8.3* 8.7* 9.2  --   --   --  9.1 8.3*  MG  --   --  2.2  --   --   --   --  2.0   GFR: Estimated Creatinine Clearance: 30.4 mL/min (A) (by C-G formula based on SCr of 1.33 mg/dL (H)). Liver Function Tests: Recent Labs  Lab 11/25/20 2022  PROT 6.2*   No results for input(s): LIPASE, AMYLASE in the last 168 hours. No results for input(s): AMMONIA in the last 168 hours. Coagulation Profile: No results for input(s): INR, PROTIME in the last 168 hours. Cardiac Enzymes: No results for input(s): CKTOTAL, CKMB, CKMBINDEX, TROPONINI in the last 168 hours. BNP (last 3 results) No results for input(s): PROBNP in the last 8760 hours. HbA1C: No results for input(s): HGBA1C in the last 72 hours. CBG: Recent Labs   Lab 11/26/20 1643  GLUCAP 147*   Lipid Profile: No results for input(s): CHOL, HDL, LDLCALC, TRIG, CHOLHDL, LDLDIRECT in the last 72 hours. Thyroid Function Tests: No results for input(s): TSH, T4TOTAL, FREET4, T3FREE, THYROIDAB in the last 72 hours. Anemia Panel: No results for input(s): VITAMINB12, FOLATE, FERRITIN, TIBC, IRON, RETICCTPCT in the last 72 hours. Sepsis Labs: No results for input(s): PROCALCITON, LATICACIDVEN in the last 168 hours.  Recent Results (from the past 240 hour(s))  Resp Panel by RT-PCR (Flu A&B, Covid) Nasopharyngeal Swab     Status: None   Collection Time: 11/23/20 11:31 AM   Specimen: Nasopharyngeal Swab; Nasopharyngeal(NP) swabs in vial transport medium  Result Value Ref Range Status   SARS Coronavirus 2 by RT PCR NEGATIVE NEGATIVE Final    Comment: (NOTE) SARS-CoV-2 target nucleic acids are NOT DETECTED.  The SARS-CoV-2 RNA is generally detectable in upper respiratory specimens during the acute phase of infection. The lowest concentration of SARS-CoV-2 viral copies this assay can detect is 138 copies/mL. A negative result does not preclude SARS-Cov-2 infection and should not be used as the sole basis for treatment or other patient management decisions. A negative result may occur with  improper specimen collection/handling, submission of specimen other than nasopharyngeal swab, presence of viral mutation(s) within the areas targeted by this assay, and inadequate number of viral copies(<138 copies/mL). A negative result must be combined with clinical observations, patient history, and epidemiological information. The expected result is Negative.  Fact Sheet for Patients:  EntrepreneurPulse.com.au  Fact Sheet for Healthcare Providers:  IncredibleEmployment.be  This test is no t yet approved or cleared by the [place] FDA and  has been authorized for detection and/or diagnosis of SARS-CoV-2 by FDA under  an Emergency Use Authorization (EUA). This EUA will remain  in effect (meaning this test can be used) for the duration of the COVID-19 declaration under Section 564(b)(1) of the Act, 21 U.S.C.section 360bbb-3(b)(1), unless the authorization is terminated  or revoked sooner.       Influenza A by PCR NEGATIVE NEGATIVE Final   Influenza B by PCR NEGATIVE NEGATIVE Final    Comment: (NOTE) The Xpert Xpress SARS-CoV-2/FLU/RSV plus assay is intended as an aid in the diagnosis of influenza from Nasopharyngeal swab specimens and should not be used as a sole basis for treatment. Nasal washings and aspirates are unacceptable for Xpert Xpress SARS-CoV-2/FLU/RSV testing.  Fact Sheet for Patients: EntrepreneurPulse.com.au  Fact Sheet for Healthcare Providers: IncredibleEmployment.be  This test is not yet approved or cleared by the [place] FDA and has been authorized for detection and/or diagnosis of SARS-CoV-2 by FDA under an Emergency Use Authorization (EUA). This EUA will remain in effect (meaning this test can be used) for the duration of the COVID-19 declaration under Section 564(b)(1) of the Act, 21 U.S.C. section 360bbb-3(b)(1), unless the authorization is terminated or revoked.  Performed at [facility], [address], [place], [place] [address]   MRSA Next Gen by PCR, Nasal     Status: None   Collection Time: 11/24/20  4:15 PM   Specimen: Nasal Mucosa; Nasal Swab  Result Value Ref Range Status   MRSA by PCR Next Gen NOT DETECTED NOT DETECTED Final    Comment: (NOTE) The GeneXpert MRSA Assay (FDA approved for NASAL specimens only), is one component of a comprehensive MRSA colonization surveillance program. It is not intended to diagnose MRSA infection nor to guide or monitor treatment for MRSA infections. Test performance is not FDA approved in patients less than 53 years old. Performed at [facility], [place] [address], [place], [place] [address]   Body fluid culture w Gram Stain     Status: None   Collection Time: 11/25/20  6:44 PM   Specimen: Pleura; Body Fluid  Result Value Ref Range Status   Specimen Description   Final    PLEURAL RIGHT Performed at [facility] [address] [place], [place] [address]    Special Requests   Final    NONE Performed at [facility], [place] [address], [place], [place] [address]    Gram Stain   Final    FEW WBC PRESENT, PREDOMINANTLY MONONUCLEAR NO ORGANISMS SEEN    Culture   Final    NO GROWTH Performed at [facility], [place] [address], [place], [place] [address]    Report Status 11/29/2020 FINAL  Final         Radiology Studies: CARDIAC CATHETERIZATION  Result Date: 11/30/2020   Mid LAD lesion is 30% stenosed.   Dist LAD lesion is 30% stenosed.   LV end diastolic pressure is low.   LV end diastolic pressure is normal.   The left ventricular ejection fraction is 55-65% by visual estimate.   Hemodynamic findings consistent with moderate pulmonary hypertension. 1.  Mild obstructive coronary artery disease. 2.  Preserved cardiac output of 6.1 L/min and an index of 3.55 L/min/m. 3.  Mean pulmonary artery pressure of 39 mmHg with pulmonary vascular resistance of 3.44 [place] units; transpulmonary gradient of 24mmHg (mean wedge pressure 10mmHg) 4.  LVEDP of 9 mmHg with preserved LVEF. Recommendation: Medical therapy.   DG CHEST PORT 1 VIEW  Result Date: 12/02/2020 CLINICAL DATA:  Shortness of breath EXAM: PORTABLE CHEST 1 VIEW COMPARISON:  Chest radiograph 11/30/2020 FINDINGS: The heart is enlarged, unchanged. The mediastinal contours are stable. There are bilateral pleural effusions with bibasilar airspace disease, overall slightly worsened since 11/30/2020. The upper lungs remain aerated. There is no pneumothorax. The bones are stable. IMPRESSION: Bilateral pleural effusions with adjacent  airspace disease/consolidation, worsened since 11/30/2020. Electronically Signed   By: Valetta Mole M.D.   On: 12/02/2020 08:52        Scheduled Meds:  aspirin EC  81 mg Oral Daily   atenolol  50 mg Oral BID   Chlorhexidine Gluconate Cloth  6 each Topical Daily   furosemide  60 mg Oral  Daily   gabapentin  100 mg Oral QHS   hydrALAZINE  50 mg Oral TID   mouth rinse  15 mL Mouth Rinse BID   melatonin  10 mg Oral QHS   methimazole  2.5 mg Oral Daily   pantoprazole  40 mg Oral Q0600   simvastatin  20 mg Oral QHS   sodium chloride flush  3 mL Intravenous Q12H   Continuous Infusions:  sodium chloride       LOS: 9 days    Georgette Shell, MD  12/02/2020, 1:13 PM

## 2020-12-03 DIAGNOSIS — E662 Morbid (severe) obesity with alveolar hypoventilation: Secondary | ICD-10-CM

## 2020-12-03 LAB — COMPREHENSIVE METABOLIC PANEL
ALT: 8 U/L (ref 0–44)
AST: 15 U/L (ref 15–41)
Albumin: 3.2 g/dL — ABNORMAL LOW (ref 3.5–5.0)
Alkaline Phosphatase: 27 U/L — ABNORMAL LOW (ref 38–126)
Anion gap: 5 (ref 5–15)
BUN: 26 mg/dL — ABNORMAL HIGH (ref 8–23)
CO2: 40 mmol/L — ABNORMAL HIGH (ref 22–32)
Calcium: 8.2 mg/dL — ABNORMAL LOW (ref 8.9–10.3)
Chloride: 82 mmol/L — ABNORMAL LOW (ref 98–111)
Creatinine, Ser: 1.53 mg/dL — ABNORMAL HIGH (ref 0.44–1.00)
GFR, Estimated: 34 mL/min — ABNORMAL LOW (ref >60)
Glucose, Bld: 119 mg/dL — ABNORMAL HIGH (ref 70–99)
Potassium: 3.6 mmol/L (ref 3.5–5.1)
Sodium: 127 mmol/L — ABNORMAL LOW (ref 135–145)
Total Bilirubin: 0.4 mg/dL (ref 0.3–1.2)
Total Protein: 6 g/dL — ABNORMAL LOW (ref 6.5–8.1)

## 2020-12-03 LAB — BLOOD GAS, VENOUS
Acid-Base Excess: 12.7 mmol/L — ABNORMAL HIGH (ref 0.0–2.0)
Bicarbonate: 39.4 mmol/L — ABNORMAL HIGH (ref 20.0–28.0)
O2 Saturation: 56.7 %
Patient temperature: 98.6
pCO2, Ven: 68.2 mmHg — ABNORMAL HIGH (ref 44.0–60.0)
pH, Ven: 7.38 (ref 7.250–7.430)
pO2, Ven: 34.5 mmHg (ref 32.0–45.0)

## 2020-12-03 NOTE — Plan of Care (Signed)
Pt able to OOB to BR with assistance and O2 tank. Pt satisfied with pain regimen. Pt ate more than 50% of all meals   Problem: Education: Goal: Knowledge of General Education information will improve Description: Including pain rating scale, medication(s)/side effects and non-pharmacologic comfort measures Outcome: Progressing   Problem: Health Behavior/Discharge Planning: Goal: Ability to manage health-related needs will improve Outcome: Progressing   Problem: Clinical Measurements: Goal: Ability to maintain clinical measurements within normal limits will improve Outcome: Progressing Goal: Will remain free from infection Outcome: Progressing Goal: Diagnostic test results will improve Outcome: Progressing Goal: Respiratory complications will improve Outcome: Progressing Goal: Cardiovascular complication will be avoided Outcome: Progressing   Problem: Activity: Goal: Risk for activity intolerance will decrease Outcome: Progressing   Problem: Nutrition: Goal: Adequate nutrition will be maintained Outcome: Progressing   Problem: Coping: Goal: Level of anxiety will decrease Outcome: Progressing   Problem: Elimination: Goal: Will not experience complications related to bowel motility Outcome: Progressing Goal: Will not experience complications related to urinary retention Outcome: Progressing   Problem: Pain Managment: Goal: General experience of comfort will improve Outcome: Progressing   Problem: Safety: Goal: Ability to remain free from injury will improve Outcome: Progressing   Problem: Skin Integrity: Goal: Risk for impaired skin integrity will decrease Outcome: Progressing   Problem: Education: Goal: Ability to demonstrate management of disease process will improve Outcome: Progressing Goal: Ability to verbalize understanding of medication therapies will improve Outcome: Progressing Goal: Individualized Educational Video(s) Outcome: Progressing    Problem: Activity: Goal: Capacity to carry out activities will improve Outcome: Progressing   Problem: Cardiac: Goal: Ability to achieve and maintain adequate cardiopulmonary perfusion will improve Outcome: Progressing

## 2020-12-03 NOTE — Care Management Important Message (Signed)
Important Message  Patient Details IM Letter placed in Patients room Name: Mandy [name] MRN: [id number] Date of Birth: 09/24/1940   Medicare Important Message Given:  Yes     Kerin Salen 12/03/2020, 2:36 PM

## 2020-12-03 NOTE — Progress Notes (Signed)
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  Patient with history of pleural effusion with recent hospitalization and underwent thoracentesis at that time.  Patient was evaluated by cardiothoracic surgery, Dr. Kipp Brood last Friday and x-ray notable for enlarging right-sided pleural effusion and was referred to pulmonology, Dr. Valeta Harms for thoracentesis.  Due to her continued progressing shortness of breath, patient presented to ED for further evaluation.  Patient typically utilizes 2 L nasal cannula at baseline but had increased to 4 L morning of hospital presentation.  Also with reported orthopnea and requiring increased inclination for sleep.  Patient's daughter did give her an extra dose of Lasix night prior to ED presentation without any significant effect.  Denies any recent travel, or changes in diet.  No sick contacts.   In the ED, patient was found to have acute on chronic respiratory failure with hypercapnia and hypoxia.  Patient was placed on BiPAP.  BNP elevated 414.8.  Sodium 134, potassium 4.0, chloride 92, bicarb 35, creatinine 0.91.  Glucose 125.  Magnesium 2.0.  LFTs within normal limits.  Hospitalist consulted for further evaluation management of acute on chronic hypoxic/hypercapnic respiratory failure with recurrent right-sided pleural effusion.  Assessment & Plan:   Principal Problem:   Acute respiratory failure with hypoxia and hypercapnia (HCC) Active Problems:   Diabetes mellitus type 2, controlled (HCC)   Dyslipidemia   Essential hypertension   Hyponatremia   Hyperthyroidism    Pleural effusion on right   AKI (acute kidney injury) ([place])   Anemia   Acute on chronic heart failure with preserved ejection fraction (HFpEF) (HCC)   Chronic respiratory failure with hypoxia, on home oxygen therapy (HCC)   Anxiety   Severe pulmonary hypertension (HCC)   Chest pain of uncertain etiology   #1 acute hypoxic hypercapnic respiratory failure/recurrent right pleural effusion- Patient presented with acute on chronic hypoxic and hypercapnic respiratory failure secondary to obesity hypoventilation syndrome.  The use of noninvasive ventilation will treat patients with high PCO2 levels and can reduce the risk of exacerbations and future hospitalizations when used at night and during the day.  Patient has had 6 hospital readmissions in the past 6 months.  BiPAP has been proven ineffective to provide essential volume control necessary to maintain acceptable CO2 levels as ABGs demonstrate consistent hypercapnia despite best efforts with using BiPAP.  An NIV with AVAPS AE is necessary to prevent harm. ( PCO2 65.5 on 11/23/2020 with elevated bicarb of 36.5 )interruption or failure to provide NIV would quickly lead to exacerbation of the patient's condition hospital admission and likely harm to the patient.  Continued use is preferred.  Furthermore adapt health breathe a little easier program has shown an 80% readmission reduction locally for all patients.  Patient is able to protect their airways and clear secretions on their own.   CT chest shows no evidence of PE but moderate right pleural effusion with atelectasis of the right lower lobe  She was followed by PCCM who signed off.  Status post thoracentesis 11/04/2020 She is status post resection of anterior mediastinal mass in September 2022 since then she has had recurrent effusions. She had thoracentesis x2 first 1 was  on 11/04/2020 and the second 1 was on 11/25/2020. Pleural fluid culture with no growth.  On Lasix 60 mg daily.  #2 acute on  chronic diastolic heart failure with an ejection fraction 55 to 60%.  Creatinine has been trending up with IV diuresis. Followed by cardiology signed off 12/02/2020 On fluid restriction 1500 mL/day Patient also has severe pulmonary hypertension right ventricular systolic pressure of 83 by transthoracic echo Cath 11/30/2020 with moderate pulmonary hypertension Cardiology recommends aspirin 81 mg daily, atenolol 50 mg twice daily, Lasix 60 mg daily, hydralazine 50 mg 3 times daily, simvastatin 20 mg daily. Repeat chest x-ray on 12/02/2020 bilateral pleural effusions with adjacent airspace disease and consolidation.  #3 AKI likely secondary to diuresis monitor closely  #4 history of essential hypertension continue atenolol Lasix and hydralazine  #5 anemia of chronic disease no active or acute bleeding noted.  #6 hypervolemic hyponatremia in the setting of CHF stable with diuresis.  #7 type 2 diabetes continue SSI not on any medications at home her hemoglobin A1c was 5.9 on 11/03/2020  #8 history of hyperthyroidism on methimazole  #9 neuropathy on gabapentin  #10 anxiety on diazepam  #11 hyperlipidemia on statin  #12 goals of care patient is a full code at this time.  However has poor prognosis with recurrent pleural effusion and severe/moderate pulmonary hypertension and chronic hypoxic hypercapnic respiratory failure.  Patient is on 2 L of oxygen at home 24/7.  Palliative care has been consulted.  #13 deconditioning PT OT currently recommending home health patient lives at home with her family.  Estimated body mass index is 33.69 kg/m as calculated from the following:   Height as of this encounter: 4\' 11"  (1.499 m).   Weight as of this encounter: 75.7 kg.  DVT prophylaxis: scd Code Status: full Family Communication: Daughter at the bedside  disposition Plan:  Status is: Inpatient  Remains inpatient appropriate because: iv treatments   Consultants:  Pccm cards  Procedures:  Right thoracentesis 11/25/2020 Right and left heart cath 11/30/2020 Antimicrobials: None  Subjective:  Patient resting in bed daughter is not at the bedside today patient tells me that she went to work and I will not be able to reach her on the phone. Unclear if she used BiPAP overnight Objective: Vitals:   12/03/20 0453 12/03/20 1046 12/03/20 1047 12/03/20 1241  BP: (!) 155/62 (!) 133/42  (!) 124/58  Pulse: 73  66 64  Resp: 19   18  Temp: 98.4 F (36.9 C)   97.6 F (36.4 C)  TempSrc:    Oral  SpO2: 100%   100%  Weight: 75.7 kg     Height:        Intake/Output Summary (Last 24 hours) at 12/03/2020 1337 Last data filed at 12/03/2020 1100 Gross per 24 hour  Intake 360 ml  Output 1000 ml  Net -640 ml    Filed Weights   11/30/20 0452 12/02/20 0500 12/03/20 0453  Weight: 76.5 kg 75.4 kg 75.7 kg    Examination:  General exam: Appears calm and comfortable  Respiratory system: Coarse bs to auscultation. Respiratory effort normal. Cardiovascular system: S1 & S2 heard, RRR. No JVD, murmurs, rubs, gallops or clicks. No pedal edema. Gastrointestinal system: Abdomen is nondistended, soft and nontender. No organomegaly or masses felt. Normal bowel sounds heard. Central nervous system: Alert and oriented. No focal neurological deficits. Extremities:no edema Skin: No rashes, lesions or ulcers Psychiatry: Judgement and insight appear normal. Mood & affect appropriate.     Data  Reviewed: I have personally reviewed following labs and imaging studies  CBC: Recent Labs  Lab 11/30/20 0422 11/30/20 1608 11/30/20 1629 11/30/20 1632 12/02/20 1046  WBC 5.4  --   --   --  6.0  HGB 9.5* 10.9* 11.2* 11.2* 8.6*  HCT 31.5* 32.0* 33.0* 33.0* 26.8*  MCV 85.6  --   --   --  80.5  PLT 315  --   --   --  841    Basic Metabolic Panel: Recent Labs  Lab 11/29/20 0413 11/30/20 0422 11/30/20 1608 11/30/20 1629 11/30/20 1632 12/01/20 0506 12/02/20 1046 12/03/20 0356  NA 136 137   < >  135 137 135 130* 127*  K 4.3 4.0   < > 3.3* 3.2* 3.5 3.5 3.6  CL 92* 89*  --   --   --  86* 86* 82*  CO2 40* 41*  --   --   --  41* 37* 40*  GLUCOSE 108* 112*  --   --   --  113* 164* 119*  BUN 23 18  --   --   --  16 21 26*  CREATININE 0.99 0.98  --   --   --  1.03* 1.33* 1.53*  CALCIUM 8.7* 9.2  --   --   --  9.1 8.3* 8.2*  MG  --  2.2  --   --   --   --  2.0  --    < > = values in this interval not displayed.    GFR: Estimated Creatinine Clearance: 26.5 mL/min (A) (by C-G formula based on SCr of 1.53 mg/dL (H)). Liver Function Tests: Recent Labs  Lab 12/03/20 0356  AST 15  ALT 8  ALKPHOS 27*  BILITOT 0.4  PROT 6.0*  ALBUMIN 3.2*    No results for input(s): LIPASE, AMYLASE in the last 168 hours. No results for input(s): AMMONIA in the last 168 hours. Coagulation Profile: No results for input(s): INR, PROTIME in the last 168 hours. Cardiac Enzymes: No results for input(s): CKTOTAL, CKMB, CKMBINDEX, TROPONINI in the last 168 hours. BNP (last 3 results) No results for input(s): PROBNP in the last 8760 hours. HbA1C: No results for input(s): HGBA1C in the last 72 hours. CBG: Recent Labs  Lab 11/26/20 1643  GLUCAP 147*    Lipid Profile: No results for input(s): CHOL, HDL, LDLCALC, TRIG, CHOLHDL, LDLDIRECT in the last 72 hours. Thyroid Function Tests: No results for input(s): TSH, T4TOTAL, FREET4, T3FREE, THYROIDAB in the last 72 hours. Anemia Panel: No results for input(s): VITAMINB12, FOLATE, FERRITIN, TIBC, IRON, RETICCTPCT in the last 72 hours. Sepsis Labs: No results for input(s): PROCALCITON, LATICACIDVEN in the last 168 hours.  Recent Results (from the past 240 hour(s))  MRSA Next Gen by PCR, Nasal     Status: None   Collection Time: 11/24/20  4:15 PM   Specimen: Nasal Mucosa; Nasal Swab  Result Value Ref Range Status   MRSA by PCR Next Gen NOT DETECTED NOT DETECTED Final    Comment: (NOTE) The GeneXpert MRSA Assay (FDA approved for NASAL specimens  only), is one component of a comprehensive MRSA colonization surveillance program. It is not intended to diagnose MRSA infection nor to guide or monitor treatment for MRSA infections. Test performance is not FDA approved in patients less than 84 years old. Performed at [facility], [place] [address], [place], [place] [address]   Body fluid culture w Gram Stain     Status: None  Collection Time: 11/25/20  6:44 PM   Specimen: Pleura; Body Fluid  Result Value Ref Range Status   Specimen Description   Final    PLEURAL RIGHT Performed at [facility] [address] [place], [place] [address]    Special Requests   Final    NONE Performed at [facility], [place] [address], [place], [place] [address]    Gram Stain   Final    FEW WBC PRESENT, PREDOMINANTLY MONONUCLEAR NO ORGANISMS SEEN    Culture   Final    NO GROWTH Performed at [facility], [place] [address] [place], [place] [address]    Report Status 11/29/2020 FINAL  Final          Radiology Studies: DG CHEST PORT 1 VIEW  Result Date: 12/02/2020 CLINICAL DATA:  Shortness of breath EXAM: PORTABLE CHEST 1 VIEW COMPARISON:  Chest radiograph 11/30/2020 FINDINGS: The heart is enlarged, unchanged. The mediastinal contours are stable. There are bilateral pleural effusions with bibasilar airspace disease, overall slightly worsened since 11/30/2020. The upper lungs remain aerated. There is no pneumothorax. The bones are stable. IMPRESSION: Bilateral pleural effusions with adjacent airspace disease/consolidation, worsened since 11/30/2020. Electronically Signed   By: Valetta Mole M.D.   On: 12/02/2020 08:52        Scheduled Meds:  aspirin EC  81 mg Oral Daily   atenolol  50 mg Oral BID   Chlorhexidine Gluconate Cloth  6 each Topical Daily   furosemide  60 mg Oral Daily   gabapentin  100 mg Oral QHS   hydrALAZINE  50 mg Oral TID   mouth rinse  15 mL Mouth Rinse BID    melatonin  10 mg Oral QHS   methimazole  2.5 mg Oral Daily   pantoprazole  40 mg Oral Q0600   simvastatin  20 mg Oral QHS   sodium chloride flush  3 mL Intravenous Q12H   Continuous Infusions:  sodium chloride       LOS: 10 days    Georgette Shell, MD  12/03/2020, 1:37 PM

## 2020-12-03 NOTE — Consult Note (Signed)
Consultation Note Date: 12/03/2020   Patient Name: Mandy [name]  DOB: 12/20/1940  MRN: [id number]  Age / Sex: 80 y.o., female  PCP: Mandy Ebbs, MD Referring Physician: Georgette Shell, MD  Reason for Consultation: Establishing goals of care  HPI/Patient Profile: 80 y.o. 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  She has been evaluated by cardiothoracic surgery and recommendation was for referral to pulmonology for thoracentesis.  She then ended up presenting to the ED for continued shortness of breath and has had repeat thoracentesis.  She remains admitted with acute hypoxic hypercapnic respiratory failure and has been requiring BiPAP, particularly at night.   She is also being actively managed for CHF with AKI.  Palliative consulted for goals of care.  Clinical Assessment and Goals of Care: I met today with Mandy [name]  and her daughter.   She speaks [place] which is related to [place] and so she understands some of what is said but her daughter often repeats phrases to her to serve as Optometrist (particular dialect not available for translation services at this time).    I introduced palliative care as specialized medical care for people living with serious illness. It focuses on providing relief from the symptoms and stress of a serious illness. The goal is to improve quality of life for both the patient and the family.  Her daughter reports that family is the most important thing to the patient.  She is her only child here in the [place] but she has other son for daughters back in [place] where she is originally from.  Her husband died many years ago.   She also has 2 grandsons that are present here today.  We discussed clinical course as well as wishes moving forward in regard to advanced directives.  We talked about the fact that she has multiple comorbidities that are not "fixable" condition and will continue to worsen over time.  Discussed monitoring her nutrition, cognition, and functional status as indicators of how she is doing overall in addition to the other factors that we consider such as lab work and imaging.  Concepts specific to code status and rehospitalization discussed.  We discussed difference between a aggressive medical intervention path and a palliative, comfort focused care path.  She and her daughter seem to minimize the severity of her condition.  At this point they feel that she has benefiting from hospitalization and continued aggressive care and want to continue with this plan moving in the future.  We also discussed compliance with regimen and necessity of wearing BiPAP, particularly at night every night.  Her daughter reports that they have been working on this and her mother has been more agreeable to wearing BiPAP past couple of nights.   Questions and concerns addressed.   PMT will continue to support holistically.  SUMMARY OF RECOMMENDATIONS   -  Full code/full scope -She and her daughter feel that she benefits from hospital-based care and she wants to continue aggressive care and plans to return to the hospital in the future. -We discussed the natural trajectory of her incurable illness.  While they are open to conversation, she seems to minimize the severity of her condition.  I discussed need for continued compliance with regimen (including BiPAP with particular) but we also discussed that this would naturally worsen over time.  Discussed monitoring of her nutrition, cognition, functional status as additional indicators of how she is doing overall. -She does not have a formal HC POA, but she confirms that her daughter  would be her surrogate decision maker.  Her daughter would be her surrogate by default as patient is currently not married and her other family is unavailable as they live in [place]. -No other palliative specific recommendations at this time, but we will continue to follow along peripherally.  Code Status/Advance Care Planning: Full code  Prognosis:  Guarded  Discharge Planning: To Be Determined      Primary Diagnoses: Present on Admission:  Acute respiratory failure with hypoxia and hypercapnia (HCC)  Essential hypertension  Hyponatremia  Pleural effusion on right  Hyperthyroidism  Dyslipidemia  AKI (acute kidney injury) ([place])   I have reviewed the medical record, interviewed the patient and family, and examined the patient. The following aspects are pertinent.  Past Medical History:  Diagnosis Date   Aortic atherosclerosis ([place]) 10/08/2018   Arthritis    Diabetes mellitus without complication (HCC)    GERD (gastroesophageal reflux disease)    Hepatic steatosis 10/08/2018   Hyperlipidemia    Hypertension    Hyperthyroidism    Mass of right ovary 10/08/2018   Osteoporosis    Pneumonia    SBO (small bowel obstruction) ([place]) 08/09/2018   SBO (small bowel obstruction) ([place]) 08/09/2018   Social History   Socioeconomic History   Marital status: Widowed    Spouse name: Not on file   Number of children: Not on file   Years of education: Not on file   Highest education level: Not on file  Occupational History   Not on file  Tobacco Use   Smoking status: Never   Smokeless tobacco: Never  Vaping Use   Vaping Use: Never used  Substance and Sexual Activity   Alcohol use: No   Drug use: No   Sexual activity: Not on file  Other Topics Concern   Not on file  Social History Narrative   Patient is from [place]   Speaks [place]   Social Determinants of Health   Financial Resource Strain: Not on file  Food Insecurity: Not on file  Transportation Needs: Not on  file  Physical Activity: Not on file  Stress: Not on file  Social Connections: Not on file   Family History  Problem Relation Age of Onset   Kidney disease Son    Colon cancer Neg Hx    Breast cancer Neg Hx    Scheduled Meds:  aspirin EC  81 mg Oral Daily   atenolol  50 mg Oral BID   Chlorhexidine Gluconate Cloth  6 each Topical Daily   furosemide  60 mg Oral Daily   gabapentin  100 mg Oral QHS   hydrALAZINE  50 mg Oral TID   mouth rinse  15 mL Mouth Rinse BID   melatonin  10 mg Oral QHS   methimazole  2.5 mg Oral Daily   pantoprazole  40 mg  Oral Q0600   simvastatin  20 mg Oral QHS   sodium chloride flush  3 mL Intravenous Q12H   Continuous Infusions:  sodium chloride     PRN Meds:.sodium chloride, acetaminophen, albuterol, alum & mag hydroxide-simeth, diazepam, lip balm, nitroGLYCERIN, ondansetron (ZOFRAN) IV, senna-docusate, sodium chloride flush Medications Prior to Admission:  Prior to Admission medications   Medication Sig Start Date End Date Taking? Authorizing Provider  acetaminophen (TYLENOL) 500 MG tablet Take 1,000 mg by mouth every 6 (six) hours as needed for fever or headache (pain).   Yes [provider]  albuterol (PROVENTIL) (2.5 MG/3ML) 0.083% nebulizer solution Take 2.5 mg by nebulization every 6 (six) hours as needed for wheezing or shortness of breath.   Yes [provider]  albuterol (VENTOLIN HFA) 108 (90 Base) MCG/ACT inhaler Inhale 2 puffs into the lungs every 6 (six) hours as needed for wheezing or shortness of breath. 06/30/20  Yes Thurnell Lose, MD  alendronate (FOSAMAX) 70 MG tablet Take 70 mg by mouth every Tuesday. Take with a full glass of water on an empty stomach.   Yes [provider]  aspirin EC 81 MG tablet Take 81 mg by mouth daily. Swallow whole.   Yes [provider]  atenolol (TENORMIN) 50 MG tablet Take 50 mg by mouth 2 (two) times daily.   Yes [provider]  calcium carbonate (OSCAL) 1500  (600 Ca) MG TABS tablet Take 600 mg of elemental calcium by mouth 2 (two) times daily with a meal.   Yes [provider]  diazepam (VALIUM) 2 MG tablet Take 2 mg by mouth every 12 (twelve) hours as needed for anxiety.   Yes [provider]  diclofenac sodium (VOLTAREN) 1 % GEL Apply 4 g topically 4 (four) times daily as needed for pain. shoulder 09/13/18  Yes [provider]  furosemide (LASIX) 40 MG tablet Take 1 tablet (40 mg total) by mouth 2 (two) times daily. Patient taking differently: Take 40 mg by mouth every morning. 10/09/20  Yes Nicole Kindred A, DO  gabapentin (NEURONTIN) 100 MG capsule Take 100 mg by mouth at bedtime. 07/18/18  Yes [provider]  guaiFENesin (MUCINEX) 600 MG 12 hr tablet Take 600 mg by mouth 2 (two) times daily as needed for cough.   Yes [provider]  hydrALAZINE (APRESOLINE) 100 MG tablet Take 0.5 tablets (50 mg total) by mouth 3 (three) times daily. 10/09/20  Yes Nicole Kindred A, DO  melatonin 5 MG TABS Take 10 mg by mouth at bedtime.   Yes [provider]  methimazole (TAPAZOLE) 5 MG tablet Take 0.5 tablets (2.5 mg total) by mouth daily. 02/04/19  Yes Hosie Poisson, MD  Multiple Vitamin (MULTIVITAMIN WITH MINERALS) TABS tablet Take 1 tablet by mouth daily.   Yes [provider]  OXYGEN Inhale 2-4 L into the lungs continuous.   Yes [provider]  pantoprazole (PROTONIX) 40 MG tablet Take 1 tablet (40 mg total) by mouth daily at 6 (six) AM. Patient taking differently: Take 40 mg by mouth every morning. 06/30/20  Yes Thurnell Lose, MD  Pseudoeph-CPM-DM-APAP (THERAFLU FLU/COLD/COUGH PO) Take 30 mLs by mouth 2 (two) times daily as needed (cough/congestion).   Yes [provider]  simvastatin (ZOCOR) 20 MG tablet Take 1 tablet (20 mg total) by mouth at bedtime. 06/30/20  Yes Thurnell Lose, MD  vitamin B-12 (CYANOCOBALAMIN) 1000 MCG tablet Take 1,000 mcg by mouth daily.   Yes [provider]  vitamin C (ASCORBIC ACID) 500 MG tablet Take 500 mg by mouth daily.   Yes [provider]  Vitamin D, Cholecalciferol, 25 MCG (1000 UT) TABS Take 1,000 Units by mouth daily.   Yes [provider]  zinc gluconate 50 MG tablet Take 50 mg by mouth daily.   Yes [provider]  acetaminophen (TYLENOL) 325 MG tablet Take 2 tablets (650 mg total) by mouth every 6 (six) hours as needed for mild pain, fever or headache. Patient not taking: Reported on 11/23/2020 11/06/20   Cherene Altes, MD  MELATONIN ER PO Take 2 tablets by mouth at bedtime as needed (sleep). Patient not taking: Reported on 11/23/2020    [provider]  traMADol (ULTRAM) 50 MG tablet Take 1 tablet (50 mg total) by mouth every 6 (six) hours as needed (mild pain). Patient not taking: Reported on 11/23/2020 09/22/20   Jadene Pierini E, PA-C   No Known Allergies Review of Systems Denies all complaints  Physical Exam General: Alert, awake, in no acute distress.   HEENT: No bruits, no goiter, no JVD Heart: Regular rate and rhythm. No murmur appreciated. Lungs: Good air movement, scattered coarse  Abdomen: Soft, nontender, nondistended, positive bowel sounds.   Ext: No significant edema Skin: Warm and dry Neuro: Grossly intact, nonfocal.   Vital Signs: BP (!) 124/58 (BP Location: Right Arm)   Pulse 64   Temp 97.6 F (36.4 C) (Oral)   Resp 18   Ht '4\' 11"'  (1.499 m)   Wt 75.7 kg   SpO2 100%   BMI 33.69 kg/m  Pain Scale: 0-10 POSS *See Group Information*: 1-Acceptable,Awake and alert Pain Score: 5    SpO2: SpO2: 100 % O2 Device:SpO2: 100 % O2 Flow Rate: .O2 Flow Rate (L/min): 4 L/min  IO: Intake/output summary:  Intake/Output Summary (Last 24 hours) at 12/03/2020 1510 Last data filed at 12/03/2020 1100 Gross per 24 hour  Intake 360 ml  Output 1000 ml  Net -640 ml    LBM: Last BM Date: 11/28/20 Baseline Weight: Weight: 76.6 kg Most recent weight: Weight: 75.7 kg      Palliative Assessment/Data:   Flowsheet Rows    Flowsheet Row Most Recent Value  Intake Tab   Referral Department Hospitalist  Unit at Time of Referral Med/Surg Unit  Palliative Care Primary Diagnosis Cardiac  Palliative Care Type New Palliative care  Reason for referral Clarify Goals of Care  Date of Admission 11/23/20  Date first seen by Palliative Care 12/02/20  Clinical Assessment   Psychosocial & Spiritual Assessment   Palliative Care Outcomes   Patient/Family meeting held? Yes  [Yes]  Who was at the meeting? Patient, daughter, grandsons       Time In: 1730 Time Out: 1850 Time Total: 39 Greater than 50%  of this time was spent counseling and coordinating care related to the above assessment and plan.  Signed by: Micheline Rough, MD   Please contact Palliative Medicine Team phone at [phone] for questions and concerns.  For individual provider: See Shea Evans

## 2020-12-03 NOTE — Plan of Care (Signed)
  Problem: Education: Goal: Knowledge of General Education information will improve Description Including pain rating scale, medication(s)/side effects and non-pharmacologic comfort measures Outcome: Progressing   

## 2020-12-04 LAB — COMPREHENSIVE METABOLIC PANEL
ALT: 8 U/L (ref 0–44)
AST: 20 U/L (ref 15–41)
Albumin: 3.4 g/dL — ABNORMAL LOW (ref 3.5–5.0)
Alkaline Phosphatase: 31 U/L — ABNORMAL LOW (ref 38–126)
Anion gap: 9 (ref 5–15)
BUN: 23 mg/dL (ref 8–23)
CO2: 33 mmol/L — ABNORMAL HIGH (ref 22–32)
Calcium: 8.6 mg/dL — ABNORMAL LOW (ref 8.9–10.3)
Chloride: 86 mmol/L — ABNORMAL LOW (ref 98–111)
Creatinine, Ser: 1.22 mg/dL — ABNORMAL HIGH (ref 0.44–1.00)
GFR, Estimated: 45 mL/min — ABNORMAL LOW (ref >60)
Glucose, Bld: 115 mg/dL — ABNORMAL HIGH (ref 70–99)
Potassium: 4.6 mmol/L (ref 3.5–5.1)
Sodium: 128 mmol/L — ABNORMAL LOW (ref 135–145)
Total Bilirubin: 0.4 mg/dL (ref 0.3–1.2)
Total Protein: 6.3 g/dL — ABNORMAL LOW (ref 6.5–8.1)

## 2020-12-04 MED ORDER — FUROSEMIDE 40 MG PO TABS
60.0000 mg | ORAL_TABLET | Freq: Two times a day (BID) | ORAL | Status: AC
  Administered 2020-12-04 – 2020-12-07 (×6): 60 mg via ORAL
  Filled 2020-12-04 (×6): qty 1

## 2020-12-04 NOTE — Plan of Care (Signed)
Pt weaned to 2L O2 at rest which is pt's BL.   Problem: Education: Goal: Knowledge of General Education information will improve Description: Including pain rating scale, medication(s)/side effects and non-pharmacologic comfort measures Outcome: Progressing   Problem: Health Behavior/Discharge Planning: Goal: Ability to manage health-related needs will improve Outcome: Progressing   Problem: Clinical Measurements: Goal: Ability to maintain clinical measurements within normal limits will improve Outcome: Progressing Goal: Will remain free from infection Outcome: Progressing Goal: Diagnostic test results will improve Outcome: Progressing Goal: Respiratory complications will improve Outcome: Progressing Goal: Cardiovascular complication will be avoided Outcome: Progressing   Problem: Activity: Goal: Risk for activity intolerance will decrease Outcome: Progressing   Problem: Nutrition: Goal: Adequate nutrition will be maintained Outcome: Progressing   Problem: Coping: Goal: Level of anxiety will decrease Outcome: Progressing   Problem: Elimination: Goal: Will not experience complications related to bowel motility Outcome: Progressing Goal: Will not experience complications related to urinary retention Outcome: Progressing   Problem: Pain Managment: Goal: General experience of comfort will improve Outcome: Progressing   Problem: Safety: Goal: Ability to remain free from injury will improve Outcome: Progressing   Problem: Skin Integrity: Goal: Risk for impaired skin integrity will decrease Outcome: Progressing   Problem: Education: Goal: Ability to demonstrate management of disease process will improve Outcome: Progressing Goal: Ability to verbalize understanding of medication therapies will improve Outcome: Progressing Goal: Individualized Educational Video(s) Outcome: Progressing   Problem: Activity: Goal: Capacity to carry out activities will  improve Outcome: Progressing   Problem: Cardiac: Goal: Ability to achieve and maintain adequate cardiopulmonary perfusion will improve Outcome: Progressing

## 2020-12-04 NOTE — Progress Notes (Signed)
Per RN, pt continuously pulling off bipap mask.  Pt wore bipap for four hours overnight, now on 4l [place].  HR 66, spo2 99%

## 2020-12-04 NOTE — Progress Notes (Signed)
Physical Therapy Treatment Patient Details Name: Mandy [name] MRN: [id number] DOB: May 01, 1940 Today's Date: 12/04/2020   History of Present Illness 80yo female who presented on 11/21 with increased SOB and DOE, difficulty maintaining SpO2 in the 90s with activity. Of note, she had a recent hospitalization for pleural effusion and SOB. Admitted with acute respiratory failure with hypoxia and hypercapnia, R pleural effusion, and acute on chronic heart failure. PMH OA, DM, HLD, HTN, CHF, osteoporosis    PT Comments    General Comments: AxO x 3 very pleasant lady but does NOT speak English Assisted OOB to go to the bathroom was difficult.  General bed mobility comments: pt self able with increased time and use of rail.  Appears weary/ill/weak. General transfer comment: Pt stood from EOB to RW with Min guard assist for safety but required increased assist from lower toilet level.  Unsteady with turns with near fall in bathroom.  Weak.  Sick.  Remained on 2 lts sats avg 92%. General Gait Details: Limited amb distance 11 feet x 2 to and from bathroom on 2 lts sats >90% but MAX c/o feel "sick" and fatigue.   Recommendations for follow up therapy are one component of a multi-disciplinary discharge planning process, led by the attending physician.  Recommendations may be updated based on patient status, additional functional criteria and insurance authorization.  Follow Up Recommendations  Home health PT     Assistance Recommended at Discharge Frequent or constant Supervision/Assistance  Equipment Recommendations  BSC/3in1;Wheelchair (measurements PT);Wheelchair cushion (measurements PT);Rolling walker (2 wheels)    Recommendations for Other Services       Precautions / Restrictions Precautions Precautions: Fall Precaution Comments: home oxygen 2 lts     Mobility  Bed Mobility Overal bed mobility: Needs Assistance Bed Mobility: Supine to Sit;Sit to Supine     Supine to sit:  Supervision Sit to supine: Min guard   General bed mobility comments: pt self able with increased time and use of rail.  Appears weary/ill/weak.    Transfers Overall transfer level: Needs assistance Equipment used: Rolling walker (2 wheels) Transfers: Sit to/from Stand;Bed to chair/wheelchair/BSC Sit to Stand: Min assist Stand pivot transfers: Mod assist         General transfer comment: Pt stood from EOB to RW with Min guard assist for safety but required increased assist from lower toilet level.  Unsteady with turns with near fall in bathroom.  Weak.  Sick.  Remained on 2 lts sats avg 92%.    Ambulation/Gait Ambulation/Gait assistance: Mod assist;Min assist Gait Distance (Feet): 22 Feet (11 feet x 2 to and from bathroom only) Assistive device: Rolling walker (2 wheels) (youth) Gait Pattern/deviations: Step-through pattern;Decreased stride length Gait velocity: decreased     General Gait Details: Limited amb distance 11 feet x 2 to and from bathroom on 2 lts sats >90% but MAX c/o feel "sick" and fatigue.   Stairs             Wheelchair Mobility    Modified Rankin (Stroke Patients Only)       Balance                                            Cognition Arousal/Alertness: Awake/alert Behavior During Therapy: WFL for tasks assessed/performed Overall Cognitive Status: Within Functional Limits for tasks assessed  General Comments: AxO x 3 very pleasant lady but does NOT speak English        Exercises      General Comments        Pertinent Vitals/Pain Pain Assessment: No/denies pain    Home Living                          Prior Function            PT Goals (current goals can now be found in the care plan section) Progress towards PT goals: Progressing toward goals    Frequency    Min 3X/week      PT Plan Current plan remains appropriate    Co-evaluation               AM-PAC PT "6 Clicks" Mobility   Outcome Measure  Help needed turning from your back to your side while in a flat bed without using bedrails?: A Little Help needed moving from lying on your back to sitting on the side of a flat bed without using bedrails?: A Little Help needed moving to and from a bed to a chair (including a wheelchair)?: A Lot Help needed standing up from a chair using your arms (e.g., wheelchair or bedside chair)?: A Lot Help needed to walk in hospital room?: A Lot Help needed climbing 3-5 steps with a railing? : Total 6 Click Score: 13    End of Session Equipment Utilized During Treatment: Gait belt;Oxygen Activity Tolerance: Patient limited by fatigue Patient left: in bed;with call bell/phone within reach;with bed alarm set Nurse Communication: Mobility status PT Visit Diagnosis: Difficulty in walking, not elsewhere classified (R26.2);Muscle weakness (generalized) (M62.81)     Time: 1325-1350 PT Time Calculation (min) (ACUTE ONLY): 25 min  Charges:  $Gait Training: 8-22 mins $Therapeutic Activity: 8-22 mins                     {Revel Stellmach  PTA Acute  Rehabilitation Services Pager      [phone] Office      [phone]

## 2020-12-04 NOTE — Progress Notes (Signed)
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  Patient with history of pleural effusion with recent hospitalization and underwent thoracentesis at that time.  Patient was evaluated by cardiothoracic surgery, Dr. Kipp Brood last Friday and x-ray notable for enlarging right-sided pleural effusion and was referred to pulmonology, Dr. Valeta Harms for thoracentesis.  Due to her continued progressing shortness of breath, patient presented to ED for further evaluation.  Patient typically utilizes 2 L nasal cannula at baseline but had increased to 4 L morning of hospital presentation.  Also with reported orthopnea and requiring increased inclination for sleep.  Patient's daughter did give her an extra dose of Lasix night prior to ED presentation without any significant effect.  Denies any recent travel, or changes in diet.  No sick contacts.   In the ED, patient was found to have acute on chronic respiratory failure with hypercapnia and hypoxia.  Patient was placed on BiPAP.  BNP elevated 414.8.  Sodium 134, potassium 4.0, chloride 92, bicarb 35, creatinine 0.91.  Glucose 125.  Magnesium 2.0.  LFTs within normal limits.  Hospitalist consulted for further evaluation management of acute on chronic hypoxic/hypercapnic respiratory failure with recurrent right-sided pleural effusion.  Assessment & Plan:   Principal Problem:   Acute respiratory failure with hypoxia and hypercapnia (HCC) Active Problems:   Diabetes mellitus type 2, controlled (HCC)   Dyslipidemia   Essential hypertension   Hyponatremia   Hyperthyroidism    Pleural effusion on right   AKI (acute kidney injury) ([place])   Anemia   Acute on chronic heart failure with preserved ejection fraction (HFpEF) (HCC)   Chronic respiratory failure with hypoxia, on home oxygen therapy (HCC)   Anxiety   Severe pulmonary hypertension (HCC)   Chest pain of uncertain etiology   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[place])   #1 acute hypoxic hypercapnic respiratory failure/recurrent right pleural effusion- Patient presented with acute on chronic hypoxic and hypercapnic respiratory failure secondary to obesity hypoventilation syndrome.  The use of noninvasive ventilation will treat patients with high PCO2 levels and can reduce the risk of exacerbations and future hospitalizations when used at night and during the day.  Patient has had 6 hospital readmissions in the past 6 months.  BiPAP has been proven ineffective to provide essential volume control necessary to maintain acceptable CO2 levels as ABGs demonstrate consistent hypercapnia despite best efforts with using BiPAP.  An NIV with AVAPS AE is necessary to prevent harm. ( PCO2 65.5 on 11/23/2020 with elevated bicarb of 36.5 )interruption or failure to provide NIV would quickly lead to exacerbation of the patient's condition hospital admission and likely harm to the patient.  Continued use is preferred.  Furthermore adapt health breathe a little easier program has shown an 80% readmission reduction locally for all patients.  Patient is able to protect their airways and clear secretions on their own.   CT chest shows no evidence of PE but moderate right pleural effusion with atelectasis of the right lower lobe  She was followed by PCCM who signed off.  Status post thoracentesis 11/04/2020 She is status post resection of anterior mediastinal mass in September 2022 since then she has had recurrent effusions. She  had thoracentesis x2 first 1 was on 11/04/2020 and the second 1 was on 11/25/2020. Pleural fluid culture with no growth.   On Lasix 60 mg daily. Chest x-ray from 12/02/2020 with bilateral pleural effusions. Repeat chest x-ray today.  #2 acute on chronic diastolic heart failure with an ejection fraction 55 to 60%.  Creatinine has been trending up with IV diuresis. Followed by cardiology signed off 12/02/2020 On fluid restriction 1500 mL/day Patient also has severe pulmonary hypertension right ventricular systolic pressure of 83 by transthoracic echo Cath 11/30/2020 with moderate pulmonary hypertension Cardiology recommends aspirin 81 mg daily, atenolol 50 mg twice daily, Lasix 60 mg daily, hydralazine 50 mg 3 times daily, simvastatin 20 mg daily. Repeat chest x-ray on 12/02/2020 bilateral pleural effusions with adjacent airspace disease and consolidation.  #3 AKI likely secondary to diuresis monitor closely  #4 history of essential hypertension continue atenolol Lasix and hydralazine  #5 anemia of chronic disease no active or acute bleeding noted.  #6 hypervolemic hyponatremia in the setting of CHF stable with diuresis.  #7 type 2 diabetes continue SSI not on any medications at home her hemoglobin A1c was 5.9 on 11/03/2020  #8 history of hyperthyroidism on methimazole  #9 neuropathy on gabapentin  #10 anxiety on diazepam  #11 hyperlipidemia on statin  #12 goals of care patient is a full code at this time.  However has poor prognosis with recurrent pleural effusion and severe/moderate pulmonary hypertension and chronic hypoxic hypercapnic respiratory failure.  Patient is on 2 L of oxygen at home 24/7.  Palliative care has been consulted.  #13 deconditioning PT OT currently recommending home health patient lives at home with her family.  Estimated body mass index is 34.33 kg/m as calculated from the following:   Height as of this encounter: 4\' 11"  (1.499 m).   Weight as of this encounter: 77.1 kg.  DVT prophylaxis: scd Code Status: full Family Communication: Discussed with daughter on the  phone disposition Plan:  Status is: Inpatient  Remains inpatient appropriate because: iv treatments   Consultants:  Pccm cards  Procedures: Right thoracentesis 11/25/2020 Right and left heart cath 11/30/2020 Antimicrobials: None  Subjective: Patient resting in bed she had trouble keeping the BiPAP again overnight  Objective: Vitals:   12/04/20 0500 12/04/20 0800 12/04/20 1029 12/04/20 1111  BP:   (!) 161/71   Pulse:   64   Resp:      Temp:      TempSrc:      SpO2:  100%  97%  Weight: 77.1 kg     Height:        Intake/Output Summary (Last 24 hours) at 12/04/2020 1328 Last data filed at 12/04/2020 1200 Gross per 24 hour  Intake 480 ml  Output 750 ml  Net -270 ml    Filed Weights   12/02/20 0500 12/03/20 0453 12/04/20 0500  Weight: 75.4 kg 75.7 kg 77.1 kg    Examination:  General exam: Appears calm and comfortable  Respiratory system: Coarse bs to auscultation. Respiratory effort normal. Cardiovascular system: S1 & S2 heard, RRR. No JVD, murmurs, rubs, gallops or clicks. No pedal edema. Gastrointestinal system: Abdomen is nondistended, soft and nontender. No organomegaly or masses felt. Normal bowel sounds heard. Central nervous system: Alert and oriented. No focal neurological deficits. Extremities:no edema Skin: No rashes, lesions or ulcers Psychiatry: Judgement and insight appear normal. Mood & affect appropriate.     Data Reviewed: I have personally reviewed following labs and imaging studies  CBC: Recent Labs  Lab 11/30/20 0422 11/30/20 1608 11/30/20 1629 11/30/20 1632 12/02/20 1046  WBC 5.4  --   --   --  6.0  HGB 9.5* 10.9* 11.2* 11.2* 8.6*  HCT 31.5* 32.0* 33.0* 33.0* 26.8*  MCV 85.6  --   --   --  80.5  PLT 315  --   --   --  606    Basic Metabolic Panel: Recent Labs  Lab 11/30/20 0422 11/30/20 1608 11/30/20 1632 12/01/20 0506 12/02/20 1046 12/03/20 0356 12/04/20 0434  NA 137   < > 137 135 130* 127* 128*  K 4.0   < > 3.2* 3.5 3.5  3.6 4.6  CL 89*  --   --  86* 86* 82* 86*  CO2 41*  --   --  41* 37* 40* 33*  GLUCOSE 112*  --   --  113* 164* 119* 115*  BUN 18  --   --  16 21 26* 23  CREATININE 0.98  --   --  1.03* 1.33* 1.53* 1.22*  CALCIUM 9.2  --   --  9.1 8.3* 8.2* 8.6*  MG 2.2  --   --   --  2.0  --   --    < > = values in this interval not displayed.    GFR: Estimated Creatinine Clearance: 33.5 mL/min (A) (by C-G formula based on SCr of 1.22 mg/dL (H)). Liver Function Tests: Recent Labs  Lab 12/03/20 0356 12/04/20 0434  AST 15 20  ALT 8 8  ALKPHOS 27* 31*  BILITOT 0.4 0.4  PROT 6.0* 6.3*  ALBUMIN 3.2* 3.4*    No results for input(s): LIPASE, AMYLASE in the last 168 hours. No results for input(s): AMMONIA in the last 168 hours. Coagulation Profile: No results for input(s): INR, PROTIME in the last 168 hours. Cardiac Enzymes: No results for input(s): CKTOTAL, CKMB, CKMBINDEX, TROPONINI in the last 168 hours. BNP (last 3 results) No results for input(s): PROBNP in the last 8760 hours. HbA1C: No results for input(s): HGBA1C in the last 72 hours. CBG: No results for input(s): GLUCAP in the last 168 hours.  Lipid Profile: No results for input(s): CHOL, HDL, LDLCALC, TRIG, CHOLHDL, LDLDIRECT in the last 72 hours. Thyroid Function Tests: No results for input(s): TSH, T4TOTAL, FREET4, T3FREE, THYROIDAB in the last 72 hours. Anemia Panel: No results for input(s): VITAMINB12, FOLATE, FERRITIN, TIBC, IRON, RETICCTPCT in the last 72 hours. Sepsis Labs: No results for input(s): PROCALCITON, LATICACIDVEN in the last 168 hours.  Recent Results (from the past 240 hour(s))  MRSA Next Gen by PCR, Nasal     Status: None   Collection Time: 11/24/20  4:15 PM   Specimen: Nasal Mucosa; Nasal Swab  Result Value Ref Range Status   MRSA by PCR Next Gen NOT DETECTED NOT DETECTED Final    Comment: (NOTE) The GeneXpert MRSA Assay (FDA approved for NASAL specimens only), is one component of a comprehensive MRSA  colonization surveillance program. It is not intended to diagnose MRSA infection nor to guide or monitor treatment for MRSA infections. Test performance is not FDA approved in patients less than 79 years old. Performed at [facility], [place] [address], [place], [place] [address]   Body fluid culture w Gram Stain     Status: None   Collection Time: 11/25/20  6:44 PM   Specimen: Pleura; Body Fluid  Result Value Ref Range Status   Specimen Description   Final    PLEURAL RIGHT Performed at  [facility], [place] [address] [place], [place] [address]    Special Requests   Final    NONE Performed at [facility], [place] [address], [place], [place] [address]    Gram Stain   Final    FEW WBC PRESENT, PREDOMINANTLY MONONUCLEAR NO ORGANISMS SEEN    Culture   Final    NO GROWTH Performed at [facility], [place] [address] [place], [place] [address]    Report Status 11/29/2020 FINAL  Final          Radiology Studies: No results found.      Scheduled Meds:  aspirin EC  81 mg Oral Daily   atenolol  50 mg Oral BID   Chlorhexidine Gluconate Cloth  6 each Topical Daily   furosemide  60 mg Oral Daily   gabapentin  100 mg Oral QHS   hydrALAZINE  50 mg Oral TID   mouth rinse  15 mL Mouth Rinse BID   melatonin  10 mg Oral QHS   methimazole  2.5 mg Oral Daily   pantoprazole  40 mg Oral Q0600   simvastatin  20 mg Oral QHS   sodium chloride flush  3 mL Intravenous Q12H   Continuous Infusions:  sodium chloride       LOS: 11 days    Georgette Shell, MD  12/04/2020, 1:28 PM

## 2020-12-05 ENCOUNTER — Inpatient Hospital Stay (HOSPITAL_COMMUNITY): Payer: Medicare HMO

## 2020-12-05 NOTE — Progress Notes (Signed)
Pt does not want to wear nocturnal bipap tonight.  Machine remains in room on standby.

## 2020-12-05 NOTE — Progress Notes (Signed)
Daily Progress Note   Patient Name: Mandy [name]       Date: 12/05/2020 DOB: 1940/05/26  Age: 80 y.o. MRN#: [id number] Attending Physician: Georgette Shell, MD Primary Care Physician: [name], MD Admit Date: 11/23/2020  Reason for Consultation/Follow-up: Establishing goals of care  Subjective: I saw and examined Ms. Fayson and spoke with her and her daughter at the bedside.  We discussed concerns about her multiple comorbidities as well as the fact that she has poorly tolerated BiPAP at night.  Discussed concern that without regular use of BiPAP, underlying conditions will continue to progress quickly and will need to have a plan in place that she continues to decline from progressive worsening of her underlying comorbidities.  During our discussion, both she and her daughter to minimize the severity of her condition and either have limited insight or limited acceptance of how sick she truly is.  They are not open to discussion for considers anything other than continuation of current care despite her daughter reporting that she understands not wearing BiPAP will need for her to decompensate quickly.  Length of Stay: 12  Current Medications: Scheduled Meds:  . aspirin EC  81 mg Oral Daily  . atenolol  50 mg Oral BID  . Chlorhexidine Gluconate Cloth  6 each Topical Daily  . furosemide  60 mg Oral BID  . gabapentin  100 mg Oral QHS  . hydrALAZINE  50 mg Oral TID  . mouth rinse  15 mL Mouth Rinse BID  . melatonin  10 mg Oral QHS  . methimazole  2.5 mg Oral Daily  . pantoprazole  40 mg Oral Q0600  . simvastatin  20 mg Oral QHS  . sodium chloride flush  3 mL Intravenous Q12H    Continuous Infusions: . sodium chloride      PRN Meds: sodium chloride, acetaminophen,  albuterol, alum & mag hydroxide-simeth, diazepam, lip balm, nitroGLYCERIN, ondansetron (ZOFRAN) IV, senna-docusate, sodium chloride flush  Physical Exam         General: Sitting in chair but sleepy  HEENT: No bruits, no goiter, no JVD Heart: Regular rate and rhythm. No murmur appreciated. Lungs: Good air movement, scattered coarse  Abdomen: Soft, nontender, nondistended, positive bowel sounds.   Ext: No significant edema Skin: Warm and dry  Vital Signs: BP (!) 138/56 (BP  Location: Right Arm)   Pulse 68   Temp 98.6 F (37 C) (Oral)   Resp 18   Ht 4\' 11"  (1.499 m)   Wt 78.8 kg   SpO2 98%   BMI 35.09 kg/m  SpO2: SpO2: 98 % O2 Device: O2 Device: Nasal Cannula O2 Flow Rate: O2 Flow Rate (L/min): 2 L/min  Intake/output summary:  Intake/Output Summary (Last 24 hours) at 12/05/2020 1922 Last data filed at 12/05/2020 1721 Gross per 24 hour  Intake 358 ml  Output 1550 ml  Net -1192 ml   LBM: Last BM Date: 12/04/20 Baseline Weight: Weight: 76.6 kg Most recent weight: Weight: 78.8 kg       Palliative Assessment/Data:    Flowsheet Rows    Flowsheet Row Most Recent Value  Intake Tab   Referral Department Hospitalist  Unit at Time of Referral Med/Surg Unit  Palliative Care Primary Diagnosis Cardiac  Palliative Care Type New Palliative care  Reason for referral Clarify Goals of Care  Date of Admission 11/23/20  Date first seen by Palliative Care 12/02/20  Clinical Assessment   Psychosocial & Spiritual Assessment   Palliative Care Outcomes   Patient/Family meeting held? Yes  [Yes]  Who was at the meeting? Patient, daughter, grandsons       Patient Active Problem List   Diagnosis Date Noted  . 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[place]) 12/03/2020  . Chest pain of uncertain etiology   . Severe pulmonary hypertension ([place])   . Chronic respiratory failure with hypoxia, on home oxygen therapy ([place]) 11/24/2020  . Anxiety 11/24/2020  . Acute respiratory failure with hypoxia and  hypercapnia ([place]) 11/23/2020  . Acute on chronic heart failure with preserved ejection fraction (HFpEF) ([place]) 11/23/2020  . Hypoxia 11/03/2020  . Pleural effusion on right 10/01/2020  . Hyperkalemia 10/01/2020  . AKI (acute kidney injury) ([place]) 10/01/2020  . Anemia 10/01/2020  . 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  . Acute respiratory failure with hypoxia ([place]) 06/26/2020  . Hypertensive urgency 06/26/2020  . Hypokalemia 06/26/2020  . Generalized weakness 06/26/2020  . Dizziness 06/26/2020  . Obesity (BMI 30-39.9) 06/26/2020  . Hyperthyroidism 06/26/2020  . Chronic diastolic CHF (congestive heart failure) ([place]) 06/26/2020  . 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  . Prolonged QT interval 01/31/2019  . Hyponatremia 01/31/2019  . Acute metabolic encephalopathy 67/34/1937  . COVID-19 virus infection 01/31/2019  . Mass of right ovary 10/08/2018  . Hepatic steatosis 10/08/2018  . Aortic atherosclerosis ([place]) 10/08/2018  . SBO (small bowel obstruction) ([place]) 08/09/2018  . Diabetes mellitus type 2, controlled ([place]) 04/07/2008  . Dyslipidemia 04/07/2008  . Essential hypertension 04/07/2008  . SORE THROAT 04/07/2008  . GERD 04/07/2008    Palliative Care Assessment & Plan   Patient Profile:  80 y.o. 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  She has been evaluated by cardiothoracic surgery and recommendation was for referral to pulmonology for thoracentesis.  She then ended up presenting to the ED for continued shortness of breath and has had repeat thoracentesis.  She remains admitted with acute hypoxic hypercapnic respiratory failure and has been requiring BiPAP, particularly at night.   She is also being actively managed  for CHF with AKI.  Palliative consulted for goals of care.  Recommendations/Plan: Full code/full scope She and her daughter feel that she benefits from  hospital-based care and she wants to continue aggressive care and plans to return to the hospital in the future. I would recommend she be followed by outpatient palliative care at time of discharge.  Goals of Care and Additional Recommendations: Limitations on Scope of Treatment: Full Scope Treatment  Code Status:    Code Status Orders  (From admission, onward)           Start     Ordered   11/23/20 1944  Full code  Continuous        11/23/20 1945           Code Status History     Date Active Date Inactive Code Status Order ID Comments User Context   11/03/2020 1655 11/07/2020 0023 Full Code [id number]  Jonnie Finner, DO ED   10/01/2020 1836 10/09/2020 2350 Full Code [id number]  Dwyane Dee, MD Inpatient   09/25/2020 0425 09/26/2020 2124 Full Code [id number]  Rise Patience, MD ED   09/17/2020 1103 09/22/2020 2329 Full Code [id number]  Barrett, Lodema Hong, PA-C Inpatient   06/26/2020 0730 06/30/2020 2229 Full Code [id number]  Norval Morton, MD ED   01/31/2019 0753 02/05/2019 0117 Full Code [id number]  Norval Morton, MD ED   10/08/2018 1228 10/11/2018 1653 Full Code [id number]  Samuella Cota, MD ED   08/09/2018 1630 08/10/2018 1840 Full Code [id number]  Karmen Bongo, MD ED       Prognosis: Guarded  Discharge Planning: To Be Determined  Care plan was discussed with patient, daughter  Thank you for allowing the Palliative Medicine Team to assist in the care of this patient.   Total Time 30 Prolonged Time Billed No      Greater than 50%  of this time was spent counseling and coordinating care related to the above assessment and plan.  Micheline Rough, MD  Please contact Palliative Medicine Team phone at [phone] for questions and concerns.

## 2020-12-05 NOTE — Progress Notes (Signed)
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  Patient with history of pleural effusion with recent hospitalization and underwent thoracentesis at that time.  Patient was evaluated by cardiothoracic surgery, Dr. Kipp Brood last Friday and x-ray notable for enlarging right-sided pleural effusion and was referred to pulmonology, Dr. Valeta Harms for thoracentesis.  Due to her continued progressing shortness of breath, patient presented to ED for further evaluation.  Patient typically utilizes 2 L nasal cannula at baseline but had increased to 4 L morning of hospital presentation.  Also with reported orthopnea and requiring increased inclination for sleep.  Patient's daughter did give her an extra dose of Lasix night prior to ED presentation without any significant effect.  Denies any recent travel, or changes in diet.  No sick contacts.   In the ED, patient was found to have acute on chronic respiratory failure with hypercapnia and hypoxia.  Patient was placed on BiPAP.  BNP elevated 414.8.  Sodium 134, potassium 4.0, chloride 92, bicarb 35, creatinine 0.91.  Glucose 125.  Magnesium 2.0.  LFTs within normal limits.  Hospitalist consulted for further evaluation management of acute on chronic hypoxic/hypercapnic respiratory failure with recurrent right-sided pleural effusion.  Assessment & Plan:   Principal Problem:   Acute respiratory failure with hypoxia and hypercapnia (HCC) Active Problems:   Diabetes mellitus type 2, controlled (HCC)   Dyslipidemia   Essential hypertension   Hyponatremia   Hyperthyroidism    Pleural effusion on right   AKI (acute kidney injury) ([place])   Anemia   Acute on chronic heart failure with preserved ejection fraction (HFpEF) (HCC)   Chronic respiratory failure with hypoxia, on home oxygen therapy (HCC)   Anxiety   Severe pulmonary hypertension (HCC)   Chest pain of uncertain etiology   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[place])   #1 acute hypoxic hypercapnic respiratory failure/recurrent right pleural effusion- Patient presented with acute on chronic hypoxic and hypercapnic respiratory failure secondary to obesity hypoventilation syndrome.  The use of noninvasive ventilation will treat patients with high PCO2 levels and can reduce the risk of exacerbations and future hospitalizations when used at night and during the day.  Patient has had 6 hospital readmissions in the past 6 months.  BiPAP has been proven ineffective to provide essential volume control necessary to maintain acceptable CO2 levels as ABGs demonstrate consistent hypercapnia despite best efforts with using BiPAP.  An NIV with AVAPS AE is necessary to prevent harm. ( PCO2 65.5 on 11/23/2020 with elevated bicarb of 36.5 )interruption or failure to provide NIV would quickly lead to exacerbation of the patient's condition hospital admission and likely harm to the patient.  Continued use is preferred.  Furthermore [employer] breathe a little easier program has shown an 80% readmission reduction locally for all patients.  Patient is able to protect their airways and clear secretions on their own.   CT chest shows no evidence of PE but moderate right pleural effusion with atelectasis of the right lower lobe  She was followed by PCCM who signed off.  Status post thoracentesis 11/04/2020 She is status post resection of anterior mediastinal mass in September 2022 since then she has had recurrent effusions. She  had thoracentesis x2 first 1 was on 11/04/2020 and the second 1 was on 11/25/2020. Pleural fluid culture with no growth.   On Lasix 60 mg daily. Chest x-ray from 12/02/2020 with bilateral pleural effusions. Repeat chest x-ray today. Discussed with her daughter about discharge home early next week and since patient has been refusing or not tolerating BiPAP  #2 acute on chronic diastolic heart failure with an ejection fraction 55 to 60%.  Creatinine has been trending up with IV diuresis. Followed by cardiology signed off 12/02/2020 On fluid restriction 1500 mL/day Patient also has severe pulmonary hypertension right ventricular systolic pressure of 83 by transthoracic echo Cath 11/30/2020 with moderate pulmonary hypertension Cardiology recommends aspirin 81 mg daily, atenolol 50 mg twice daily, Lasix 60 mg daily, hydralazine 50 mg 3 times daily, simvastatin 20 mg daily. Repeat chest x-ray on 12/02/2020 bilateral pleural effusions with adjacent airspace disease and consolidation.  #3 AKI likely secondary to diuresis monitor closely  #4 history of essential hypertension.137/58.continue atenolol Lasix and hydralazine  #5 anemia of chronic disease no active or acute bleeding noted.  #6 hypervolemic hyponatremia in the setting of CHF stable with diuresis.  #7 type 2 diabetes continue SSI not on any medications at home her hemoglobin A1c was 5.9 on 11/03/2020  #8 history of hyperthyroidism on methimazole  #9 neuropathy on gabapentin  #10 anxiety on diazepam  #11 hyperlipidemia on statin  #12 goals of care patient is a full code at this time.  However has poor prognosis with recurrent pleural effusion and severe/moderate pulmonary hypertension and chronic hypoxic hypercapnic respiratory failure.  Patient is on 2 L of oxygen at home 24/7.  Palliative care has been consulted.  #13 deconditioning PT OT currently recommending home health patient lives at home with her family.  Estimated body mass index is 35.09 kg/m as calculated from the following:   Height as of this encounter: 4\' 11"  (1.499 m).   Weight as of  this encounter: 78.8 kg.  DVT prophylaxis: scd Code Status: full Family Communication: Discussed with daughter on the phone disposition Plan:  Status is: Inpatient  Remains inpatient appropriate because: iv treatments   Consultants:  Pccm cards  Procedures: Right thoracentesis 11/25/2020 Right and left heart cath 11/30/2020 Antimicrobials: None  Subjective: Patient continues not to tolerate BiPAP at nights at the maximum she can keep for 2 hours now she is down to 2 L of oxygen her daughter is very concerned about taking her home and no one to watch her while she is working  Objective: Vitals:   12/04/20 2016 12/04/20 2344 12/05/20 0545 12/05/20 0649  BP: 106/90  (!) 137/58   Pulse: 72 72 66   Resp: 18 18 16    Temp: 98.8 F (37.1 C)  97.9 F (36.6 C)   TempSrc: Oral  Oral   SpO2: 95% 94% 96%   Weight:    78.8 kg  Height:        Intake/Output Summary (Last 24 hours) at 12/05/2020 1331 Last data filed at 12/05/2020 0900 Gross per 24 hour  Intake 638 ml  Output 1000 ml  Net -362 ml    Filed Weights   12/03/20 0453 12/04/20 0500 12/05/20 0649  Weight: 75.7 kg 77.1 kg 78.8 kg    Examination:  General exam: Appears calm and comfortable  Respiratory system: Coarse bs to auscultation. Respiratory effort normal. Cardiovascular system: S1 & S2 heard, RRR. No JVD, murmurs, rubs, gallops or clicks. No pedal edema. Gastrointestinal system: Abdomen is nondistended, soft  and nontender. No organomegaly or masses felt. Normal bowel sounds heard. Central nervous system: Alert and oriented. No focal neurological deficits. Extremities:no edema Skin: No rashes, lesions or ulcers Psychiatry: Judgement and insight appear normal. Mood & affect appropriate.     Data Reviewed: I have personally reviewed following labs and imaging studies  CBC: Recent Labs  Lab 11/30/20 0422 11/30/20 1608 11/30/20 1629 11/30/20 1632 12/02/20 1046  WBC 5.4  --   --   --  6.0  HGB 9.5* 10.9*  11.2* 11.2* 8.6*  HCT 31.5* 32.0* 33.0* 33.0* 26.8*  MCV 85.6  --   --   --  80.5  PLT 315  --   --   --  177    Basic Metabolic Panel: Recent Labs  Lab 11/30/20 0422 11/30/20 1608 11/30/20 1632 12/01/20 0506 12/02/20 1046 12/03/20 0356 12/04/20 0434  NA 137   < > 137 135 130* 127* 128*  K 4.0   < > 3.2* 3.5 3.5 3.6 4.6  CL 89*  --   --  86* 86* 82* 86*  CO2 41*  --   --  41* 37* 40* 33*  GLUCOSE 112*  --   --  113* 164* 119* 115*  BUN 18  --   --  16 21 26* 23  CREATININE 0.98  --   --  1.03* 1.33* 1.53* 1.22*  CALCIUM 9.2  --   --  9.1 8.3* 8.2* 8.6*  MG 2.2  --   --   --  2.0  --   --    < > = values in this interval not displayed.    GFR: Estimated Creatinine Clearance: 33.9 mL/min (A) (by C-G formula based on SCr of 1.22 mg/dL (H)). Liver Function Tests: Recent Labs  Lab 12/03/20 0356 12/04/20 0434  AST 15 20  ALT 8 8  ALKPHOS 27* 31*  BILITOT 0.4 0.4  PROT 6.0* 6.3*  ALBUMIN 3.2* 3.4*    No results for input(s): LIPASE, AMYLASE in the last 168 hours. No results for input(s): AMMONIA in the last 168 hours. Coagulation Profile: No results for input(s): INR, PROTIME in the last 168 hours. Cardiac Enzymes: No results for input(s): CKTOTAL, CKMB, CKMBINDEX, TROPONINI in the last 168 hours. BNP (last 3 results) No results for input(s): PROBNP in the last 8760 hours. HbA1C: No results for input(s): HGBA1C in the last 72 hours. CBG: No results for input(s): GLUCAP in the last 168 hours.  Lipid Profile: No results for input(s): CHOL, HDL, LDLCALC, TRIG, CHOLHDL, LDLDIRECT in the last 72 hours. Thyroid Function Tests: No results for input(s): TSH, T4TOTAL, FREET4, T3FREE, THYROIDAB in the last 72 hours. Anemia Panel: No results for input(s): VITAMINB12, FOLATE, FERRITIN, TIBC, IRON, RETICCTPCT in the last 72 hours. Sepsis Labs: No results for input(s): PROCALCITON, LATICACIDVEN in the last 168 hours.  Recent Results (from the past 240 hour(s))  Body fluid  culture w Gram Stain     Status: None   Collection Time: 11/25/20  6:44 PM   Specimen: Pleura; Body Fluid  Result Value Ref Range Status   Specimen Description   Final    PLEURAL RIGHT Performed at [facility] [address], [place], [place] [address]    Special Requests   Final    NONE Performed at [facility], [place] [address] [place], [place] [address]    Gram Stain   Final    FEW WBC PRESENT, PREDOMINANTLY MONONUCLEAR NO ORGANISMS SEEN  Culture   Final    NO GROWTH Performed at [facility], [place] [address], [place], [place] [address]    Report Status 11/29/2020 FINAL  Final          Radiology Studies: No results found.      Scheduled Meds:  aspirin EC  81 mg Oral Daily   atenolol  50 mg Oral BID   Chlorhexidine Gluconate Cloth  6 each Topical Daily   furosemide  60 mg Oral BID   gabapentin  100 mg Oral QHS   hydrALAZINE  50 mg Oral TID   mouth rinse  15 mL Mouth Rinse BID   melatonin  10 mg Oral QHS   methimazole  2.5 mg Oral Daily   pantoprazole  40 mg Oral Q0600   simvastatin  20 mg Oral QHS   sodium chloride flush  3 mL Intravenous Q12H   Continuous Infusions:  sodium chloride       LOS: 12 days    Georgette Shell, MD  12/05/2020, 1:31 PM

## 2020-12-06 ENCOUNTER — Inpatient Hospital Stay (HOSPITAL_COMMUNITY): Payer: Medicare HMO

## 2020-12-06 DIAGNOSIS — I639 Cerebral infarction, unspecified: Secondary | ICD-10-CM | POA: Insufficient documentation

## 2020-12-06 DIAGNOSIS — R2990 Unspecified symptoms and signs involving the nervous system: Secondary | ICD-10-CM

## 2020-12-06 LAB — COMPREHENSIVE METABOLIC PANEL
ALT: 8 U/L (ref 0–44)
AST: 14 U/L — ABNORMAL LOW (ref 15–41)
Albumin: 3.3 g/dL — ABNORMAL LOW (ref 3.5–5.0)
Alkaline Phosphatase: 30 U/L — ABNORMAL LOW (ref 38–126)
Anion gap: 4 — ABNORMAL LOW (ref 5–15)
BUN: 17 mg/dL (ref 8–23)
CO2: 38 mmol/L — ABNORMAL HIGH (ref 22–32)
Calcium: 8.5 mg/dL — ABNORMAL LOW (ref 8.9–10.3)
Chloride: 89 mmol/L — ABNORMAL LOW (ref 98–111)
Creatinine, Ser: 1.29 mg/dL — ABNORMAL HIGH (ref 0.44–1.00)
GFR, Estimated: 42 mL/min — ABNORMAL LOW (ref >60)
Glucose, Bld: 109 mg/dL — ABNORMAL HIGH (ref 70–99)
Potassium: 3.4 mmol/L — ABNORMAL LOW (ref 3.5–5.1)
Sodium: 131 mmol/L — ABNORMAL LOW (ref 135–145)
Total Bilirubin: 0.6 mg/dL (ref 0.3–1.2)
Total Protein: 6 g/dL — ABNORMAL LOW (ref 6.5–8.1)

## 2020-12-06 LAB — CBC
HCT: 26.6 % — ABNORMAL LOW (ref 36.0–46.0)
Hemoglobin: 8.7 g/dL — ABNORMAL LOW (ref 12.0–15.0)
MCH: 26.4 pg (ref 26.0–34.0)
MCHC: 32.7 g/dL (ref 30.0–36.0)
MCV: 80.6 fL (ref 80.0–100.0)
Platelets: 339 10*3/uL (ref 150–400)
RBC: 3.3 MIL/uL — ABNORMAL LOW (ref 3.87–5.11)
RDW: 16.2 % — ABNORMAL HIGH (ref 11.5–15.5)
WBC: 5 10*3/uL (ref 4.0–10.5)
nRBC: 0 % (ref 0.0–0.2)

## 2020-12-06 MED ORDER — STROKE: EARLY STAGES OF RECOVERY BOOK
Freq: Once | Status: AC
  Administered 2020-12-06: 23:00:00 1
  Filled 2020-12-06: qty 1

## 2020-12-06 NOTE — Consult Note (Signed)
Neurology Consultation  Reason for Consult: Stroke on CT Referring Physician: Dr. Zigmund Daniel, hospitalist  CC: Shortness of breath, dizziness  History is obtained from: Chart, patient  HPI: Mandy [name] is a 80 y.o. 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 Patient speaks some English but primarily speaks another language and did not want translator to be used.  From what I could gather on my history, she does not have any focal symptoms-mostly generalized weakness and dizziness along with difficulty walking. Unable to establish last known well. Case was discussed presumably earlier in the day with the on-call neurologist from the hospitalist at [facility] who recommended transfer to [facility] for stroke work-up given new finding on the CT.  MRI not available at [facility] over the weekend.   LKW: Unclear tpa given?: no, unclear last known well Premorbid modified Rankin scale (mRS): 2-3   ROS: Full ROS was performed and is negative except as noted in the HPI.  Past Medical History:  Diagnosis Date   Aortic atherosclerosis ([place]) 10/08/2018   Arthritis    Diabetes mellitus without complication (HCC)    GERD (gastroesophageal reflux disease)    Hepatic steatosis 10/08/2018   Hyperlipidemia    Hypertension    Hyperthyroidism    Mass of right ovary 10/08/2018   Osteoporosis    Pneumonia    SBO (small bowel obstruction) ([place]) 08/09/2018   SBO (small bowel obstruction) ([place]) 08/09/2018   Family History  Problem Relation Age of Onset   Kidney disease  Son    Colon cancer Neg Hx    Breast cancer Neg Hx    Social History:   reports that she has never smoked. She has never used smokeless tobacco. She reports that she does not drink alcohol and does not use drugs.  Medications  Current Facility-Administered Medications:    0.9 %  sodium chloride infusion, 250 mL, Intravenous, PRN, Early Osmond, MD   acetaminophen (TYLENOL) tablet 650 mg, 650 mg, Oral, Q4H PRN, Early Osmond, MD, 650 mg at 12/05/20 2145   albuterol (PROVENTIL) (2.5 MG/3ML) 0.083% nebulizer solution 2.5 mg, 2.5 mg, Inhalation, Q4H PRN, Chotiner, Yevonne Aline, MD, 2.5 mg at 11/29/20 1048   alum & mag hydroxide-simeth (MAALOX/MYLANTA) 200-200-20 MG/5ML suspension 30 mL, 30 mL, Oral, Q4H PRN, [place] Donnamarie Poag, DO, 30 mL at 12/04/20 1714   aspirin EC tablet 81 mg, 81 mg, Oral, Daily, Chotiner, Yevonne Aline, MD, 81 mg at 12/06/20 0937   atenolol (TENORMIN) tablet 50 mg, 50 mg, Oral, BID, Chotiner, Yevonne Aline, MD, 50 mg at 12/06/20 7001   diazepam (VALIUM) tablet 2 mg, 2 mg, Oral, Q12H PRN, Chotiner, Yevonne Aline, MD, 2 mg at 12/04/20 0501   furosemide (LASIX) tablet 60 mg, 60 mg, Oral, BID, Georgette Shell, MD, 60 mg at 12/06/20 1707   gabapentin (NEURONTIN) capsule 100 mg, 100 mg, Oral, QHS, Elodia Florence., MD, 100 mg at 12/05/20 2145   hydrALAZINE (APRESOLINE) tablet 50 mg, 50 mg, Oral, TID, Chotiner, Yevonne Aline, MD, 50 mg at 12/06/20 1502   lip balm (CARMEX) ointment, , Topical, PRN, [place] Donnamarie Poag, DO   MEDLINE  mouth rinse, 15 mL, Mouth Rinse, BID, Elodia Florence., MD, 15 mL at 12/06/20 1316   melatonin tablet 10 mg, 10 mg, Oral, QHS, Elodia Florence., MD, 10 mg at 12/05/20 2145   methimazole (TAPAZOLE) tablet 2.5 mg, 2.5 mg, Oral, Daily, Elodia Florence., MD, 2.5 mg at 12/06/20 0865   nitroGLYCERIN (NITROSTAT) SL tablet 0.4 mg, 0.4 mg, Sublingual, Q5 min PRN, Donato Heinz, MD, 0.4 mg at 11/29/20 1048   ondansetron (ZOFRAN) injection 4 mg,  4 mg, Intravenous, Q6H PRN, Early Osmond, MD   pantoprazole (PROTONIX) EC tablet 40 mg, 40 mg, Oral, Q0600, Elodia Florence., MD, 40 mg at 12/06/20 7846   senna-docusate (Senokot-S) tablet 1 tablet, 1 tablet, Oral, QHS PRN, Chotiner, Yevonne Aline, MD   simvastatin (ZOCOR) tablet 20 mg, 20 mg, Oral, QHS, Chotiner, Yevonne Aline, MD, 20 mg at 12/05/20 2146   sodium chloride flush (NS) 0.9 % injection 10-40 mL, 10-40 mL, Intracatheter, PRN, [place] Donnamarie Poag, DO, 10 mL at 12/01/20 0944   sodium chloride flush (NS) 0.9 % injection 3 mL, 3 mL, Intravenous, Q12H, Dunn, Dayna N, PA-C, 3 mL at 12/06/20 1316   Exam: Current vital signs: BP (!) 141/50 (BP Location: Right Arm)   Pulse 68   Temp 98.9 F (37.2 C) (Oral)   Resp 20   Ht 4\' 11"  (1.499 m)   Wt 78.2 kg   SpO2 100%   BMI 34.82 kg/m  Vital signs in last 24 hours: Temp:  [98 F (36.7 C)-98.9 F (37.2 C)] 98.9 F (37.2 C) (12/04 2000) Pulse Rate:  [67-76] 68 (12/04 2000) Resp:  [17-20] 20 (12/04 2000) BP: (104-159)/(50-79) 141/50 (12/04 2000) SpO2:  [94 %-100 %] 100 % (12/04 2000) Weight:  [78.2 kg] 78.2 kg (12/04 0408) General: Awake alert in no distress HEENT: Normocephalic/atraumatic  Lungs: Diminished breath sounds bilaterally, on supplemental oxygen Cardiovascular: Regular rhythm Extremities warm well perfused  Neurological exam Awake alert oriented to self and the fact that she is in the hospital.  Could not tell me her age or the current month. Speech does not seem to be dysarthric Naming comprehension and repetition seem intact Cranial nerves: 2-12 intact Motor exam with no drift in any of the 4 extremities Sensory exam with no deficits Coordination difficult to assess due to the language barrier and ability to follow commands but no obvious dysmetria noted.  NIHSS 1a Level of Conscious.: 0 1b LOC Questions: 2 1c LOC Commands: 0 2 Best Gaze: 0 3 Visual: 0 4 Facial Palsy: 0 5a Motor Arm - left: 0 5b Motor Arm -  Right: 0 6a Motor Leg - Left: 0 6b Motor Leg - Right: 0 7 Limb Ataxia: 0 8 Sensory: 0 9 Best Language: 0 10 Dysarthria: 0 11 Extinct. and Inatten.: 0 TOTAL: 2    Labs I have reviewed labs in epic and the results pertinent to this consultation are:   CBC    Component Value Date/Time   WBC 5.0 12/06/2020 0411   RBC 3.30 (L) 12/06/2020 0411   HGB 8.7 (L) 12/06/2020 0411   HCT 26.6 (L) 12/06/2020 0411   HCT 33.2 (L) 02/03/2019 1220   PLT 339 12/06/2020 0411   MCV 80.6 12/06/2020 0411   MCH 26.4 12/06/2020 0411   MCHC 32.7 12/06/2020 0411   RDW 16.2 (H) 12/06/2020 0411   LYMPHSABS 1.6 11/25/2020 0306   MONOABS 0.8 11/25/2020 0306   EOSABS 0.1 11/25/2020 0306   BASOSABS  0.0 11/25/2020 0306    CMP     Component Value Date/Time   NA 131 (L) 12/06/2020 0411   K 3.4 (L) 12/06/2020 0411   CL 89 (L) 12/06/2020 0411   CO2 38 (H) 12/06/2020 0411   GLUCOSE 109 (H) 12/06/2020 0411   BUN 17 12/06/2020 0411   CREATININE 1.29 (H) 12/06/2020 0411   CREATININE 1.47 (H) 10/14/2020 0000   CALCIUM 8.5 (L) 12/06/2020 0411   PROT 6.0 (L) 12/06/2020 0411   ALBUMIN 3.3 (L) 12/06/2020 0411   AST 14 (L) 12/06/2020 0411   ALT 8 12/06/2020 0411   ALKPHOS 30 (L) 12/06/2020 0411   BILITOT 0.6 12/06/2020 0411   GFRNONAA 42 (L) 12/06/2020 0411   GFRNONAA 43 (L) 07/07/2020 0000   GFRAA 50 (L) 07/07/2020 0000    Lipid Panel     Component Value Date/Time   CHOL 204 (H) 04/09/2020 1523   TRIG 57 06/26/2020 0420   HDL 65 04/09/2020 1523   CHOLHDL 3.1 04/09/2020 1523   VLDL 16 11/30/2009 2128   LDLCALC 114 (H) 04/09/2020 1523   2D echo- LVEF 55 to 60%, right ventricular systolic function mildly reduced, right ventricle size normal, left atrial size mild to moderately dilated, right atrial size mildly dilated, mitral valve normal, aortic valve tricuspid with no regurgitation visualized, left pleural effusion noted.  No interatrial shunt by color flow Doppler.  Imaging I have reviewed the  images obtained:  CT-head-subacute appearing right frontal small infarct in comparison to the prior CT  MRI examination of the brain-ordered  Assessment: 80 year old woman with above past medical history presenting for evaluation of multiple medical issues that are being managed by medicine, CCM and cardiology, had a brain CT scan done due to complaints of dizziness.  Subacute appearing right frontal infarct noted on CT imaging. Neurological consultation for further work-up. Her dizziness symptoms are nonspecific and could be related to her multiple toxic metabolic derangements at this time. I am uncertain of the age of this infarct seen on CT.  An MRI would better characterize this. Given the risk factors that she has, worthwhile to check stroke risk factors. Echo was already done-there is evidence of left atrial dilatation-no history of documented A. fib.  Will benefit from outpatient cardiac monitoring.  Recommendations: Telemetry Frequent rechecks Aspirin Had a statin high-dose statin Cannot get CTA due to deranged renal function.  Would recommend MRA head and carotid Dopplers. 2D echo has been completed-see above Check lipid panel Check A1c PT OT speech therapy Outpatient cardiac monitoring should be considered-we will defer to the stroke team for final recommendations after completion of work-up. Stroke team will follow with you Plan relayed via secure chat to Dr. Zigmund Daniel    -- Amie Portland, MD Neurologist [facility] Pager: [phone]

## 2020-12-06 NOTE — Progress Notes (Addendum)
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  Patient with history of pleural effusion with recent hospitalization and underwent thoracentesis at that time.  Patient was evaluated by cardiothoracic surgery, Dr. Kipp Brood last Friday and x-ray notable for enlarging right-sided pleural effusion and was referred to pulmonology, Dr. Valeta Harms for thoracentesis.  Due to her continued progressing shortness of breath, patient presented to ED for further evaluation.  Patient typically utilizes 2 L nasal cannula at baseline but had increased to 4 L morning of hospital presentation.  Also with reported orthopnea and requiring increased inclination for sleep.  Patient's daughter did give her an extra dose of Lasix night prior to ED presentation without any significant effect.  Denies any recent travel, or changes in diet.  No sick contacts.   In the ED, patient was found to have acute on chronic respiratory failure with hypercapnia and hypoxia.  Patient was placed on BiPAP.  BNP elevated 414.8.  Sodium 134, potassium 4.0, chloride 92, bicarb 35, creatinine 0.91.  Glucose 125.  Magnesium 2.0.  LFTs within normal limits.  Hospitalist consulted for further evaluation management of acute on chronic hypoxic/hypercapnic respiratory failure with recurrent right-sided pleural effusion.  Assessment & Plan:   Principal Problem:   Acute respiratory failure with hypoxia and hypercapnia (HCC) Active Problems:   Diabetes mellitus type 2, controlled (HCC)   Dyslipidemia   Essential hypertension   Hyponatremia   Hyperthyroidism    Pleural effusion on right   AKI (acute kidney injury) ([place])   Anemia   Acute on chronic heart failure with preserved ejection fraction (HFpEF) (HCC)   Chronic respiratory failure with hypoxia, on home oxygen therapy (HCC)   Anxiety   Severe pulmonary hypertension (HCC)   Chest pain of uncertain etiology   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[place])   #1 acute hypoxic hypercapnic respiratory failure/recurrent right pleural effusion- Patient presented with acute on chronic hypoxic and hypercapnic respiratory failure secondary to obesity hypoventilation syndrome.  The use of noninvasive ventilation will treat patients with high PCO2 levels and can reduce the risk of exacerbations and future hospitalizations when used at night and during the day.  Patient has had 6 hospital readmissions in the past 6 months.  BiPAP has been proven ineffective to provide essential volume control necessary to maintain acceptable CO2 levels as ABGs demonstrate consistent hypercapnia despite best efforts with using BiPAP.  An NIV with AVAPS AE is necessary to prevent harm. ( PCO2 65.5 on 11/23/2020 with elevated bicarb of 36.5 )interruption or failure to provide NIV would quickly lead to exacerbation of the patient's condition hospital admission and likely harm to the patient.  Continued use is preferred.  Furthermore adapt health breathe a little easier program has shown an 80% readmission reduction locally for all patients.  Patient is able to protect their airways and clear secretions on their own.   CT chest shows no evidence of PE but moderate right pleural effusion with atelectasis of the right lower lobe  She was followed by PCCM who signed off.  Status post thoracentesis 11/04/2020 She is status post resection of anterior mediastinal mass in September 2022 since then she has had recurrent effusions. She  had thoracentesis x2 first 1 was on 11/04/2020 and the second 1 was on 11/25/2020. Pleural fluid culture with no growth.   On Lasix 60 mg daily. Chest x-ray from 12/02/2020 with bilateral pleural effusions. Repeat chest x-ray today.   ADDENDUM-CT HEAD-Small subacute appearing infarct in the anterior Right MCA territory. This is new since September. No associated hemorrhage or mass effect.Otherwise stable CT appearance of chronic small vessel disease. DW Dr Cheral Marker recommended to transfer patient to [facility] and to call neuro once patient arrives at [facility].  #2 acute on chronic diastolic heart failure with an ejection fraction 55 to 60%.  Creatinine has been trending up with IV diuresis. Followed by cardiology signed off 12/02/2020 On fluid restriction 1500 mL/day Patient also has severe pulmonary hypertension right ventricular systolic pressure of 83 by transthoracic echo Cath 11/30/2020 with moderate pulmonary hypertension Cardiology recommends aspirin 81 mg daily, atenolol 50 mg twice daily, Lasix 60 mg daily, hydralazine 50 mg 3 times daily, simvastatin 20 mg daily. Repeat chest x-ray on 12/02/2020 bilateral pleural effusions with adjacent airspace disease and consolidation.  #3 AKI likely secondary to diuresis monitor closely  #4 history of essential hypertension.137/58.continue atenolol Lasix and hydralazine  #5 anemia of chronic disease no active or acute bleeding noted.  #6 hypervolemic hyponatremia in the setting of CHF stable with diuresis.  #7 type 2 diabetes continue SSI not on any medications at home her hemoglobin A1c was 5.9 on 11/03/2020  #8 history of hyperthyroidism on methimazole  #9 neuropathy on gabapentin  #10 anxiety on diazepam  #11 hyperlipidemia on statin  #12 goals of care patient is a full code at this time.  However has poor prognosis with recurrent pleural effusion and severe/moderate pulmonary hypertension and chronic hypoxic hypercapnic respiratory failure.  Patient is on 2 L of oxygen at home 24/7.  Palliative care has been consulted.  #13 deconditioning PT OT currently  recommending home health patient lives at home with her family.  #14 subacute stroke CT head on 12/06/2020 transferring patient to [facility].  Estimated body mass index is 34.82 kg/m as calculated from the following:   Height as of this encounter: 4\' 11"  (1.499 m).   Weight as of this encounter: 78.2 kg.  DVT prophylaxis: scd Code Status: full Family Communication: Discussed with daughter on the phone disposition Plan:  Status is: Inpatient  Remains inpatient appropriate because: iv treatments   Consultants:  Pccm cards  Procedures: Right thoracentesis 11/25/2020 Right and left heart cath 11/30/2020 Antimicrobials: None  Subjective:   She is sitting up eating breakfast Did not use bipap again last nite Objective: Vitals:   12/05/20 2127 12/06/20 0402 12/06/20 0408 12/06/20 0939  BP: (!) 159/59 (!) 148/51    Pulse: 76 67    Resp: 19 17    Temp: 98.4 F (36.9 C) 98.4 F (36.9 C)    TempSrc: Oral Oral    SpO2: 94% 97%  99%  Weight:   78.2 kg   Height:        Intake/Output Summary (Last 24 hours) at 12/06/2020 1129 Last data filed at 12/05/2020 2200 Gross per 24 hour  Intake 60 ml  Output 1450 ml  Net -1390 ml    Filed Weights   12/04/20 0500 12/05/20 0649 12/06/20 0408  Weight: 77.1 kg 78.8 kg 78.2 kg    Examination:  General exam: Appears calm and comfortable  Respiratory system: Coarse bs to auscultation. Respiratory effort normal. Cardiovascular system: S1 & S2 heard, RRR. No JVD, murmurs, rubs,  gallops or clicks. No pedal edema. Gastrointestinal system: Abdomen is nondistended, soft and nontender. No organomegaly or masses felt. Normal bowel sounds heard. Central nervous system: Alert and oriented.  Moves all extremities. Extremities:no edema  Skin: No rashes, lesions or ulcers Psychiatry: Judgement and insight appear normal. Mood & affect appropriate.     Data Reviewed: I have personally reviewed following labs and imaging studies  CBC: Recent Labs   Lab 11/30/20 0422 11/30/20 1608 11/30/20 1629 11/30/20 1632 12/02/20 1046 12/06/20 0411  WBC 5.4  --   --   --  6.0 5.0  HGB 9.5* 10.9* 11.2* 11.2* 8.6* 8.7*  HCT 31.5* 32.0* 33.0* 33.0* 26.8* 26.6*  MCV 85.6  --   --   --  80.5 80.6  PLT 315  --   --   --  235 767    Basic Metabolic Panel: Recent Labs  Lab 11/30/20 0422 11/30/20 1608 12/01/20 0506 12/02/20 1046 12/03/20 0356 12/04/20 0434 12/06/20 0411  NA 137   < > 135 130* 127* 128* 131*  K 4.0   < > 3.5 3.5 3.6 4.6 3.4*  CL 89*  --  86* 86* 82* 86* 89*  CO2 41*  --  41* 37* 40* 33* 38*  GLUCOSE 112*  --  113* 164* 119* 115* 109*  BUN 18  --  16 21 26* 23 17  CREATININE 0.98  --  1.03* 1.33* 1.53* 1.22* 1.29*  CALCIUM 9.2  --  9.1 8.3* 8.2* 8.6* 8.5*  MG 2.2  --   --  2.0  --   --   --    < > = values in this interval not displayed.    GFR: Estimated Creatinine Clearance: 31.9 mL/min (A) (by C-G formula based on SCr of 1.29 mg/dL (H)). Liver Function Tests: Recent Labs  Lab 12/03/20 0356 12/04/20 0434 12/06/20 0411  AST 15 20 14*  ALT 8 8 8   ALKPHOS 27* 31* 30*  BILITOT 0.4 0.4 0.6  PROT 6.0* 6.3* 6.0*  ALBUMIN 3.2* 3.4* 3.3*    No results for input(s): LIPASE, AMYLASE in the last 168 hours. No results for input(s): AMMONIA in the last 168 hours. Coagulation Profile: No results for input(s): INR, PROTIME in the last 168 hours. Cardiac Enzymes: No results for input(s): CKTOTAL, CKMB, CKMBINDEX, TROPONINI in the last 168 hours. BNP (last 3 results) No results for input(s): PROBNP in the last 8760 hours. HbA1C: No results for input(s): HGBA1C in the last 72 hours. CBG: No results for input(s): GLUCAP in the last 168 hours.  Lipid Profile: No results for input(s): CHOL, HDL, LDLCALC, TRIG, CHOLHDL, LDLDIRECT in the last 72 hours. Thyroid Function Tests: No results for input(s): TSH, T4TOTAL, FREET4, T3FREE, THYROIDAB in the last 72 hours. Anemia Panel: No results for input(s): VITAMINB12, FOLATE,  FERRITIN, TIBC, IRON, RETICCTPCT in the last 72 hours. Sepsis Labs: No results for input(s): PROCALCITON, LATICACIDVEN in the last 168 hours.  No results found for this or any previous visit (from the past 240 hour(s)).         Radiology Studies: DG Chest 1 View  Result Date: 12/05/2020 CLINICAL DATA:  Hypoxia EXAM: CHEST  1 VIEW COMPARISON:  Chest x-rays dated 12/02/2020 and 11/30/2020. FINDINGS: Overall no significant interval change. Stable cardiomegaly. Mild central pulmonary vascular congestion. Veiled bibasilar opacities, likely small pleural effusions and/or atelectasis. IMPRESSION: 1. Stable cardiomegaly with central pulmonary vascular congestion suggesting mild CHF/volume overload. 2. Probable small bilateral pleural effusions and/or atelectasis. Electronically Signed  By: Franki Cabot M.D.   On: 12/05/2020 16:32        Scheduled Meds:  aspirin EC  81 mg Oral Daily   atenolol  50 mg Oral BID   furosemide  60 mg Oral BID   gabapentin  100 mg Oral QHS   hydrALAZINE  50 mg Oral TID   mouth rinse  15 mL Mouth Rinse BID   melatonin  10 mg Oral QHS   methimazole  2.5 mg Oral Daily   pantoprazole  40 mg Oral Q0600   simvastatin  20 mg Oral QHS   sodium chloride flush  3 mL Intravenous Q12H   Continuous Infusions:  sodium chloride       LOS: 13 days    Georgette Shell, MD  12/06/2020, 11:29 AM

## 2020-12-06 NOTE — Progress Notes (Signed)
Attempted to contact daughter [name] twice to notify her patient was being transferred to [facility]. Calls went straight to voice mail, this RN did leave a message for [name] to contact us here at [place] or nurses station at [employer] at [facility].

## 2020-12-06 NOTE — Progress Notes (Signed)
Patient currently on 2L [place]. Vitals stable. Spo2 96%. Patient refused CPAP. States she will wear [place] only. Patient aware to let RT/RN know if she would like to use CPAP.

## 2020-12-07 ENCOUNTER — Inpatient Hospital Stay (HOSPITAL_COMMUNITY): Payer: Medicare HMO

## 2020-12-07 DIAGNOSIS — R2990 Unspecified symptoms and signs involving the nervous system: Secondary | ICD-10-CM

## 2020-12-07 DIAGNOSIS — D649 Anemia, unspecified: Secondary | ICD-10-CM

## 2020-12-07 DIAGNOSIS — I6330 Cerebral infarction due to thrombosis of unspecified cerebral artery: Secondary | ICD-10-CM | POA: Insufficient documentation

## 2020-12-07 LAB — CBC
HCT: 24.8 % — ABNORMAL LOW (ref 36.0–46.0)
Hemoglobin: 8.1 g/dL — ABNORMAL LOW (ref 12.0–15.0)
MCH: 26.3 pg (ref 26.0–34.0)
MCHC: 32.7 g/dL (ref 30.0–36.0)
MCV: 80.5 fL (ref 80.0–100.0)
Platelets: 341 10*3/uL (ref 150–400)
RBC: 3.08 MIL/uL — ABNORMAL LOW (ref 3.87–5.11)
RDW: 16 % — ABNORMAL HIGH (ref 11.5–15.5)
WBC: 5.2 10*3/uL (ref 4.0–10.5)
nRBC: 0 % (ref 0.0–0.2)

## 2020-12-07 LAB — COMPREHENSIVE METABOLIC PANEL
ALT: 7 U/L (ref 0–44)
AST: 13 U/L — ABNORMAL LOW (ref 15–41)
Albumin: 3 g/dL — ABNORMAL LOW (ref 3.5–5.0)
Alkaline Phosphatase: 30 U/L — ABNORMAL LOW (ref 38–126)
Anion gap: 7 (ref 5–15)
BUN: 16 mg/dL (ref 8–23)
CO2: 37 mmol/L — ABNORMAL HIGH (ref 22–32)
Calcium: 8.5 mg/dL — ABNORMAL LOW (ref 8.9–10.3)
Chloride: 86 mmol/L — ABNORMAL LOW (ref 98–111)
Creatinine, Ser: 1.33 mg/dL — ABNORMAL HIGH (ref 0.44–1.00)
GFR, Estimated: 41 mL/min — ABNORMAL LOW (ref >60)
Glucose, Bld: 98 mg/dL (ref 70–99)
Potassium: 3.2 mmol/L — ABNORMAL LOW (ref 3.5–5.1)
Sodium: 130 mmol/L — ABNORMAL LOW (ref 135–145)
Total Bilirubin: 0.5 mg/dL (ref 0.3–1.2)
Total Protein: 5.6 g/dL — ABNORMAL LOW (ref 6.5–8.1)

## 2020-12-07 LAB — LIPID PANEL
Cholesterol: 170 mg/dL (ref 0–200)
HDL: 53 mg/dL
LDL Cholesterol: 107 mg/dL — ABNORMAL HIGH (ref 0–99)
Total CHOL/HDL Ratio: 3.2 ratio
Triglycerides: 50 mg/dL
VLDL: 10 mg/dL (ref 0–40)

## 2020-12-07 LAB — HEMOGLOBIN A1C
Hgb A1c MFr Bld: 5.7 % — ABNORMAL HIGH (ref 4.8–5.6)
Mean Plasma Glucose: 116.89 mg/dL

## 2020-12-07 MED ORDER — SIMVASTATIN 20 MG PO TABS
40.0000 mg | ORAL_TABLET | Freq: Every day | ORAL | Status: DC
  Filled 2020-12-07: qty 2

## 2020-12-07 MED ORDER — CLOPIDOGREL BISULFATE 75 MG PO TABS
75.0000 mg | ORAL_TABLET | Freq: Every day | ORAL | Status: DC
  Administered 2020-12-07 – 2020-12-08 (×2): 75 mg via ORAL
  Filled 2020-12-07 (×2): qty 1

## 2020-12-07 MED ORDER — FUROSEMIDE 40 MG PO TABS
60.0000 mg | ORAL_TABLET | Freq: Every day | ORAL | Status: DC
  Administered 2020-12-08: 60 mg via ORAL
  Filled 2020-12-07: qty 1

## 2020-12-07 MED ORDER — POTASSIUM CHLORIDE CRYS ER 20 MEQ PO TBCR
40.0000 meq | EXTENDED_RELEASE_TABLET | Freq: Two times a day (BID) | ORAL | Status: AC
  Administered 2020-12-07: 40 meq via ORAL
  Filled 2020-12-07 (×2): qty 2

## 2020-12-07 NOTE — Progress Notes (Signed)
Attempted to use audio interpreter on ipad, but told no interpreter for [place] available through this service.

## 2020-12-07 NOTE — Plan of Care (Signed)
  Problem: Education: Goal: Knowledge of General Education information will improve Description: Including pain rating scale, medication(s)/side effects and non-pharmacologic comfort measures Outcome: Progressing   Problem: Health Behavior/Discharge Planning: Goal: Ability to manage health-related needs will improve Outcome: Progressing   Problem: Clinical Measurements: Goal: Ability to maintain clinical measurements within normal limits will improve Outcome: Progressing Goal: Will remain free from infection Outcome: Progressing Goal: Diagnostic test results will improve Outcome: Progressing Goal: Respiratory complications will improve Outcome: Progressing Goal: Cardiovascular complication will be avoided Outcome: Progressing   Problem: Activity: Goal: Risk for activity intolerance will decrease Outcome: Progressing   Problem: Nutrition: Goal: Adequate nutrition will be maintained Outcome: Progressing   Problem: Coping: Goal: Level of anxiety will decrease Outcome: Progressing   Problem: Elimination: Goal: Will not experience complications related to bowel motility Outcome: Progressing Goal: Will not experience complications related to urinary retention Outcome: Progressing   Problem: Pain Managment: Goal: General experience of comfort will improve Outcome: Progressing   Problem: Safety: Goal: Ability to remain free from injury will improve Outcome: Progressing   Problem: Skin Integrity: Goal: Risk for impaired skin integrity will decrease Outcome: Progressing   Problem: Education: Goal: Ability to demonstrate management of disease process will improve Outcome: Progressing Goal: Ability to verbalize understanding of medication therapies will improve Outcome: Progressing Goal: Individualized Educational Video(s) Outcome: Progressing   Problem: Activity: Goal: Capacity to carry out activities will improve Outcome: Progressing   Problem: Cardiac: Goal:  Ability to achieve and maintain adequate cardiopulmonary perfusion will improve Outcome: Progressing   Problem: Education: Goal: Knowledge of disease or condition will improve Outcome: Progressing Goal: Knowledge of secondary prevention will improve (SELECT ALL) Outcome: Progressing Goal: Knowledge of patient specific risk factors will improve (INDIVIDUALIZE FOR PATIENT) Outcome: Progressing Goal: Individualized Educational Video(s) Outcome: Progressing   Problem: Coping: Goal: Will verbalize positive feelings about self Outcome: Progressing Goal: Will identify appropriate support needs Outcome: Progressing   Problem: Health Behavior/Discharge Planning: Goal: Ability to manage health-related needs will improve Outcome: Progressing   Problem: Self-Care: Goal: Ability to participate in self-care as condition permits will improve Outcome: Progressing Goal: Verbalization of feelings and concerns over difficulty with self-care will improve Outcome: Progressing Goal: Ability to communicate needs accurately will improve Outcome: Progressing   Problem: Nutrition: Goal: Risk of aspiration will decrease Outcome: Progressing   Problem: Ischemic Stroke/TIA Tissue Perfusion: Goal: Complications of ischemic stroke/TIA will be minimized Outcome: Progressing

## 2020-12-07 NOTE — Progress Notes (Signed)
[facility] is awaiting insurance authorization for NIV for the patient. MD updated.

## 2020-12-07 NOTE — Progress Notes (Signed)
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  Patient with history of pleural effusion with recent hospitalization and underwent thoracentesis at that time.  Patient was evaluated by cardiothoracic surgery, Dr. Kipp Brood and x-ray notable for enlarging right-sided pleural effusion and was referred to pulmonology, Dr. Valeta Harms for thoracentesis.  Due to her continued progressing shortness of breath, patient presented to ED for further evaluation.  Patient typically utilizes 2 L nasal cannula at baseline but had increased to 4 L morning of hospital presentation. In the ED, patient was found to have acute on chronic respiratory failure with hypercapnia and hypoxia.  Patient was placed on BiPAP.  She was hospitalized for further management.  She was seen by cardiology and pulmonology.   Consultants: Pulmonology.  Cardiology.  Neurology.  Procedures:    Right thoracentesis 11/25/2020 Right and left heart cath 11/30/2020    Subjective/Interval History: Patient's daughter was able to interpret.  Patient noted to be lying flat on the bed without any breathing issues.  She mentioned that she is feeling well.  Denies any headaches.  No dizziness.  No chest pain or shortness of breath.     Assessment/Plan:  Acute on chronic hypoxic and hypercapnic respiratory failure Usually uses 2 L of oxygen by nasal cannula.  Had increased oxygen requirements during the early part of this admission.  Also required BiPAP. Currently on 2 L of oxygen saturating in the mid to late 90s. Has not been able to  wear the BiPAP. Previous rounding MD initiated the process for a noninvasive ventilator.  Please see the notes from 12/4. CT chest showed no evidence for PE.  Recurrent pleural effusion Patient was seen by pulmonology and underwent thoracentesis on 11/211/23.  CT scan did show a moderate right-sided pleural effusion.  Pulmonology has signed off. She is status post resection of anterior mediastinal mass in September 2022.  Pleural fluid cultures without any growth.  Thought to be a transudative process. Cytology showed atypical mesothelial cells. Will need outpatient follow-up with pulmonology. Chest x-ray on 12/3 showed stable findings with small pleural effusions  Questionable acute stroke Patient complained of dizziness on 12/4.  A CT head was done which raised concern for a subacute infarct in the right MCA territory.  Transferred to [facility].  Seen by neurology.  MRI is pending.  Further management depending on MRI findings. LDL noted to be 107.  Patient on simvastatin. HbA1c 5.7. Recent echocardiogram from 11/22 showed normal systolic function.  No significant valvular disease was noted.  Acute on chronic diastolic CHF/moderate pulmonary hypertension Normal systolic function noted on recent echocardiogram.  Patient with severe pulmonary hypertension. Underwent left and right heart catheterization on 11/29.  Did not show any significant CAD.  Moderate pulmonary hypertension was noted.  Medical management was recommended. Patient remains on aspirin, atenolol, hydralazine.  She is supposed to be on furosemide but currently not on hold due to rising creatinine.  Renal function noted to be stable for the most part.  Reinitiate furosemide. Strict ins and outs and daily weights.  Acute kidney injury/hyponatremia/hypokalemia Electrolytes are stable.  Replace potassium.  Renal function is stable.  Monitor urine output.  Avoid nephrotoxic  agents.  Essential hypertension Stable.   Continue current medications.  Anemia of chronic disease Stable.  No evidence of overt blood loss.  Diabetes mellitus type 2 Diet controlled.  HbA1c 5.7.  Not on any medications for same.  History of hyperthyroidism On methimazole.  History of neuropathy On gabapentin.  History of anxiety On diazepam.  Hyperlipidemia On statin.  Goals of care Seen by palliative care.  Remains full code at this time.  Obesity Estimated body mass index is 34.82 kg/m as calculated from the following:   Height as of this encounter: 4\' 11"  (1.499 m).   Weight as of this encounter: 78.2 kg.   DVT Prophylaxis: SCD Code Status: Full code Family Communication: Discussed with patient's daughter Disposition Plan: Hopefully home in 24 to 48 hours.  Home health was recommended by PT and OT.  Status is: Inpatient  Remains inpatient appropriate because: Acute respiratory failure.  Acute stroke      Medications: Scheduled:  aspirin EC  81 mg Oral Daily   atenolol  50 mg Oral BID   gabapentin  100 mg Oral QHS   hydrALAZINE  50 mg Oral TID   mouth rinse  15 mL Mouth Rinse BID   melatonin  10 mg Oral QHS   methimazole  2.5 mg Oral Daily   pantoprazole  40 mg Oral Q0600   simvastatin  20 mg Oral QHS   sodium chloride flush  3 mL Intravenous Q12H   Continuous:  sodium chloride     LTJ:QZESPQ chloride, acetaminophen, albuterol, alum & mag hydroxide-simeth, diazepam, lip balm, nitroGLYCERIN, ondansetron (ZOFRAN) IV, senna-docusate, sodium chloride flush  Antibiotics: Anti-infectives (From admission, onward)    None       Objective:  Vital Signs  Vitals:   12/06/20 2324 12/07/20 0000 12/07/20 0400 12/07/20 0811  BP:  134/62 (!) 137/52 (!) 133/51  Pulse: 69 70 66 64  Resp: 18 18 18 19   Temp:  98.7 F (37.1 C) 98.3 F (36.8 C) 98 F (36.7 C)  TempSrc:  Oral Oral Oral  SpO2: 96% 100% 100% 100%  Weight:      Height:        Intake/Output Summary (Last 24 hours) at 12/07/2020  1026 Last data filed at 12/07/2020 0830 Gross per 24 hour  Intake 3 ml  Output 150 ml  Net -147 ml   Filed Weights   12/04/20 0500 12/05/20 0649 12/06/20 0408  Weight: 77.1 kg 78.8 kg 78.2 kg    General appearance: Awake alert.  In no distress Resp: Diminished air entry at the bases but no definite crackles appreciated.  No wheezing or rhonchi.  Normal effort at rest Cardio: S1-S2 is normal regular.  No S3-S4.  No rubs murmurs or bruit GI: Abdomen is soft.  Nontender nondistended.  Bowel sounds are present normal.  No masses organomegaly Extremities: No edema.  Full range of motion of lower extremities. Neurologic:   No focal neurological deficits.    Lab Results:  Data Reviewed: I have personally reviewed following labs and imaging studies  CBC: Recent Labs  Lab 11/30/20 1629 11/30/20 1632 12/02/20 1046 12/06/20 0411 12/07/20 0144  WBC  --   --  6.0 5.0 5.2  HGB 11.2* 11.2* 8.6* 8.7* 8.1*  HCT 33.0* 33.0* 26.8* 26.6* 24.8*  MCV  --   --  80.5 80.6 80.5  PLT  --   --  235 339 330    Basic Metabolic Panel: Recent Labs  Lab 12/02/20 1046 12/03/20 0356  12/04/20 0434 12/06/20 0411 12/07/20 0144  NA 130* 127* 128* 131* 130*  K 3.5 3.6 4.6 3.4* 3.2*  CL 86* 82* 86* 89* 86*  CO2 37* 40* 33* 38* 37*  GLUCOSE 164* 119* 115* 109* 98  BUN 21 26* 23 17 16   CREATININE 1.33* 1.53* 1.22* 1.29* 1.33*  CALCIUM 8.3* 8.2* 8.6* 8.5* 8.5*  MG 2.0  --   --   --   --     GFR: Estimated Creatinine Clearance: 31 mL/min (A) (by C-G formula based on SCr of 1.33 mg/dL (H)).  Liver Function Tests: Recent Labs  Lab 12/03/20 0356 12/04/20 0434 12/06/20 0411 12/07/20 0144  AST 15 20 14* 13*  ALT 8 8 8 7   ALKPHOS 27* 31* 30* 30*  BILITOT 0.4 0.4 0.6 0.5  PROT 6.0* 6.3* 6.0* 5.6*  ALBUMIN 3.2* 3.4* 3.3* 3.0*     HbA1C: Recent Labs    12/07/20 0144  HGBA1C 5.7*     Lipid Profile: Recent Labs    12/07/20 0144  CHOL 170  HDL 53  LDLCALC 107*  TRIG 50  CHOLHDL  3.2     Radiology Studies: DG Chest 1 View  Result Date: 12/05/2020 CLINICAL DATA:  Hypoxia EXAM: CHEST  1 VIEW COMPARISON:  Chest x-rays dated 12/02/2020 and 11/30/2020. FINDINGS: Overall no significant interval change. Stable cardiomegaly. Mild central pulmonary vascular congestion. Veiled bibasilar opacities, likely small pleural effusions and/or atelectasis. IMPRESSION: 1. Stable cardiomegaly with central pulmonary vascular congestion suggesting mild CHF/volume overload. 2. Probable small bilateral pleural effusions and/or atelectasis. Electronically Signed   By: Franki Cabot M.D.   On: 12/05/2020 16:32   CT HEAD WO CONTRAST (5MM)  Result Date: 12/06/2020 CLINICAL DATA:  80 year old female with dizziness. EXAM: CT HEAD WITHOUT CONTRAST TECHNIQUE: Contiguous axial images were obtained from the base of the skull through the vertex without intravenous contrast. COMPARISON:  Head CT 09/24/2020. FINDINGS: Brain: Patchy and confluent bilateral cerebral white matter hypodensity compatible with chronic small vessel disease. But in the anterior right MCA territory there is new cytotoxic edema or developing encephalomalacia since September (series 2, image 24) in an area of about 18 mm. No associated hemorrhage or mass effect. No changes of recent cortically based infarct elsewhere. Stable gray-white matter differentiation elsewhere. No superimposed shift, ventriculomegaly, mass effect, evidence of mass lesion, intracranial hemorrhage. Vascular: Calcified atherosclerosis at the skull base. No suspicious intracranial vascular hyperdensity. Skull: No acute osseous abnormality identified. Sinuses/Orbits: Visualized paranasal sinuses and mastoids are stable and well aerated. Tympanic cavities are clear. Other: No acute orbit or scalp soft tissue finding. IMPRESSION: 1. 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 This is new since September. No associated hemorrhage or mass effect. 2.  Otherwise stable CT appearance of chronic small vessel disease. These results will be called to the ordering clinician or representative by the Radiologist Assistant, and communication documented in the PACS or [employer]. Electronically Signed   By: Genevie Ann M.D.   On: 12/06/2020 11:54       LOS: 14 days   Farida Mcreynolds [employer] on [email or web address]  12/07/2020, 10:26 AM

## 2020-12-07 NOTE — Progress Notes (Signed)
Physical Therapy Treatment Patient Details Name: Mandy [name] MRN: [id number] DOB: [birth year] Today's Date: 12/07/2020   History of Present Illness 80yo female who presented on 11/21 with increased SOB and DOE, difficulty maintaining SpO2 in the 90s with activity. Of note, she had a recent hospitalization for pleural effusion and SOB. Admitted with acute respiratory failure with hypoxia and hypercapnia, R pleural effusion, and acute on chronic heart failure. PMH OA, DM, HLD, HTN, CHF, osteoporosis    PT Comments    Pt willing to work with PT however pt fatigued quickly limiting ambulation tolerance. Pt with drop in SPO2 to 65% on RA, returned to 90% in 2 min on 2Lo2 via [place]. Pt with noted L UE weakness requiring increased assist from PT for walker management. Pt with labored effort during ambulation. Acute PT to cont to follow.    Recommendations for follow up therapy are one component of a multi-disciplinary discharge planning process, led by the attending physician.  Recommendations may be updated based on patient status, additional functional criteria and insurance authorization.  Follow Up Recommendations  Home health PT     Assistance Recommended at Discharge Frequent or constant Supervision/Assistance  Equipment Recommendations  BSC/3in1;Wheelchair (measurements PT);Wheelchair cushion (measurements PT);Rolling walker (2 wheels)    Recommendations for Other Services       Precautions / Restrictions Precautions Precautions: Fall Precaution Comments: home oxygen 2LO2 via [place] Restrictions Weight Bearing Restrictions: No     Mobility  Bed Mobility Overal bed mobility: Needs Assistance Bed Mobility: Supine to Sit;Sit to Supine     Supine to sit: Min assist;HOB elevated Sit to supine: Min assist   General bed mobility comments: pt reached out for PT's hand to pull on it for assist, minA to scoot hips to EOB, minA For LE mangement back into bed, modAx2 to scoot up in bed     Transfers Overall transfer level: Needs assistance Equipment used: Rolling walker (2 wheels) Transfers: Sit to/from Stand;Bed to chair/wheelchair/BSC Sit to Stand: Min assist           General transfer comment: minA for walker management and to steady    Ambulation/Gait Ambulation/Gait assistance: Mod assist;Min assist Gait Distance (Feet): 20 Feet (to/from bed to door) Assistive device: Rolling walker (2 wheels) (youth) Gait Pattern/deviations: Decreased stride length;Step-to pattern;Wide base of support Gait velocity: decreased Gait velocity interpretation: <1.31 ft/sec, indicative of household ambulator   General Gait Details: pt with labored effort placing R elbow on walker to advance LEs, slow pace, modA for walker mangagement as pt pushing to far out in front of self requiring PT to stop it, modA for turning, pt's 2LO2 removed for 1 min due to being tangled up and SPO2 at 65%, within 1 minute pt back up to 85%, 2 min 90% on 2LO2 via [place].   Stairs             Wheelchair Mobility    Modified Rankin (Stroke Patients Only)       Balance Overall balance assessment: Needs assistance Sitting-balance support: Feet supported Sitting balance-Leahy Scale: Good     Standing balance support: Reliant on assistive device for balance Standing balance-Leahy Scale: Poor                              Cognition Arousal/Alertness: Awake/alert (woke pt up) Behavior During Therapy: WFL for tasks assessed/performed (difficult to assess with language berrior) Overall Cognitive Status: Difficult to assess  General Comments: AxO x 3 very pleasant lady but does NOT speak English        Exercises      General Comments General comments (skin integrity, edema, etc.): Pt SPO2 at 65% on RA, Increased to 90% s/p 56min on 2LO2 via [place], Pt stating "I don't feel good"      Pertinent Vitals/Pain Pain Assessment: No/denies  pain    Home Living                          Prior Function            PT Goals (current goals can now be found in the care plan section) Acute Rehab PT Goals Patient Stated Goal: feel better PT Goal Formulation: With patient Time For Goal Achievement: 12/11/20 Potential to Achieve Goals: Fair Progress towards PT goals: Not progressing toward goals - comment (limited by cardiopulmonary condition and language barrier)    Frequency    Min 3X/week      PT Plan Current plan remains appropriate    Co-evaluation              AM-PAC PT "6 Clicks" Mobility   Outcome Measure  Help needed turning from your back to your side while in a flat bed without using bedrails?: A Little Help needed moving from lying on your back to sitting on the side of a flat bed without using bedrails?: A Little Help needed moving to and from a bed to a chair (including a wheelchair)?: A Lot Help needed standing up from a chair using your arms (e.g., wheelchair or bedside chair)?: A Lot Help needed to walk in hospital room?: A Lot Help needed climbing 3-5 steps with a railing? : Total 6 Click Score: 13    End of Session Equipment Utilized During Treatment: Gait belt;Oxygen Activity Tolerance: Patient limited by fatigue Patient left: in bed;with call bell/phone within reach;with bed alarm set Nurse Communication: Mobility status PT Visit Diagnosis: Difficulty in walking, not elsewhere classified (R26.2);Muscle weakness (generalized) (M62.81)     Time: 0071-2197 PT Time Calculation (min) (ACUTE ONLY): 21 min  Charges:  $Gait Training: 8-22 mins                    Kittie Plater, PT, DPT Acute Rehabilitation Services Pager #: [phone] Office #: [phone]    Mandy [name] 12/07/2020, 4:53 PM

## 2020-12-07 NOTE — Progress Notes (Signed)
Pt refused BiPAP for the evening. RT will cont to monitor.  °

## 2020-12-07 NOTE — Progress Notes (Addendum)
STROKE TEAM PROGRESS NOTE   INTERVAL HISTORY Patient is seen in her room with her daughter [name] at the bedside.  She speaks [place] (for which no interpreter is available), but her daughter is able to interpret for her.  Patient states that over the past few days, she has had severe headaches and some generalized dizziness and weakness but denies other stroke symptoms.  A head CT was performed yesterday demonstrating a subacute right frontal lobe MCA stroke, and she was transferred here for stroke workup. She just returned from MRI scan which confirms right frontal MCA branch infarct and MR angiogram showed no significant large vessel stenosis effusion.  Patient denies any prior history of atrial fibrillation, palpitations or syncopal episodes.  No prior history of strokes either. Vitals:   12/06/20 2324 12/07/20 0000 12/07/20 0400 12/07/20 0811  BP:  134/62 (!) 137/52 (!) 133/51  Pulse: 69 70 66 64  Resp: 18 18 18 19   Temp:  98.7 F (37.1 C) 98.3 F (36.8 C) 98 F (36.7 C)  TempSrc:  Oral Oral Oral  SpO2: 96% 100% 100% 100%  Weight:      Height:       CBC:  Recent Labs  Lab 12/06/20 0411 12/07/20 0144  WBC 5.0 5.2  HGB 8.7* 8.1*  HCT 26.6* 24.8*  MCV 80.6 80.5  PLT 339 263   Basic Metabolic Panel:  Recent Labs  Lab 12/02/20 1046 12/03/20 0356 12/06/20 0411 12/07/20 0144  NA 130*   < > 131* 130*  K 3.5   < > 3.4* 3.2*  CL 86*   < > 89* 86*  CO2 37*   < > 38* 37*  GLUCOSE 164*   < > 109* 98  BUN 21   < > 17 16  CREATININE 1.33*   < > 1.29* 1.33*  CALCIUM 8.3*   < > 8.5* 8.5*  MG 2.0  --   --   --    < > = values in this interval not displayed.   Lipid Panel:  Recent Labs  Lab 12/07/20 0144  CHOL 170  TRIG 50  HDL 53  CHOLHDL 3.2  VLDL 10  LDLCALC 107*   HgbA1c:  Recent Labs  Lab 12/07/20 0144  HGBA1C 5.7*   Urine Drug Screen: No results for input(s): LABOPIA, COCAINSCRNUR, LABBENZ, AMPHETMU, THCU, LABBARB in the last 168 hours.  Alcohol Level No  results for input(s): ETH in the last 168 hours.  IMAGING past 24 hours MR ANGIO HEAD WO CONTRAST  Result Date: 12/07/2020 CLINICAL DATA:  Stroke, follow-up EXAM: MRI HEAD WITHOUT CONTRAST MRA HEAD WITHOUT CONTRAST TECHNIQUE: Multiplanar, multi-echo pulse sequences of the brain and surrounding structures were acquired without intravenous contrast. Angiographic images of the Circle of Willis were acquired using MRA technique without intravenous contrast. COMPARISON:  CT head 12/06/2020, correlation is also made with MRI head 06/02/2005 FINDINGS: MRI HEAD FINDINGS Brain: Wedge-shaped area of restricted diffusion with ADC correlate in the right frontal lobe in the right MCA territory (series 5, images 19-24), consistent with the findings seen on the 12/06/2020 CT head. This area is associated with increased T2 signal. No acute hemorrhage, mass, mass effect, or midline shift. No hydrocephalus or extra-axial collection. T2 hyperintense signal in the periventricular white matter and pons, likely the sequela of moderate chronic small vessel ischemic disease. Vascular: Please see MRA findings below. Skull and upper cervical spine: Normal marrow signal. Degenerative changes in the cervical spine, with reversal of the normal cervical lordosis.  Sinuses/Orbits: No acute or significant finding. Status post right lens replacement. Other: Trace fluid in right mastoid air cells. MRA HEAD FINDINGS Anterior circulation: Both internal carotid arteries are patent to the termini, with mild irregularity in right greater than left cavernous ICA and moderate narrowing of the right greater than left supraclinoid ICA, likely secondary to atherosclerotic plaque. A1 segments patent, although there may be focal narrowing at the origin of the right greater than A1. Normal anterior communicating artery. Anterior cerebral arteries are patent to their distal aspects. No M1 stenosis or occlusion. Normal MCA bifurcations. Narrowing of a proximal  right M2 branch (series 4, image 79). Distal MCA branches perfused and symmetric. There is possible focal stenosis proximally of a small right MCA branch (series 4, image 84), which appears to supply the right frontal lobe area described above as having restricted diffusion. Posterior circulation: Vertebral arteries patent to the vertebrobasilar junction without stenosis. Posterior inferior cerebral arteries visualized on the left. Basilar patent to its distal aspect. Superior cerebellar arteries patent bilaterally. Diminutive right P1, with near fetal origin of the right PCA. PCAs perfused to their distal aspects without stenosis. The bilateral posterior communicating arteries are visualized. Anatomic variants: None significant IMPRESSION: 1. Restricted diffusion in the right MCA territory, with associated increased T2 signal, which correlates with the findings seen on the 12/06/2020 CT head and is consistent with subacute infarct. 2. No intracranial large vessel occlusion. Multifocal narrowing in the intracranial ICAs, likely secondary to atherosclerotic plaque. In addition, there is possible focal stenosis proximally of a right MCA branch, which appears to supply the right anterior frontal lobe in the area of infarction. Electronically Signed   By: Merilyn Baba M.D.   On: 12/07/2020 12:44   MR BRAIN WO CONTRAST  Result Date: 12/07/2020 CLINICAL DATA:  Stroke, follow-up EXAM: MRI HEAD WITHOUT CONTRAST MRA HEAD WITHOUT CONTRAST TECHNIQUE: Multiplanar, multi-echo pulse sequences of the brain and surrounding structures were acquired without intravenous contrast. Angiographic images of the Circle of Willis were acquired using MRA technique without intravenous contrast. COMPARISON:  CT head 12/06/2020, correlation is also made with MRI head 06/02/2005 FINDINGS: MRI HEAD FINDINGS Brain: Wedge-shaped area of restricted diffusion with ADC correlate in the right frontal lobe in the right MCA territory (series 5,  images 19-24), consistent with the findings seen on the 12/06/2020 CT head. This area is associated with increased T2 signal. No acute hemorrhage, mass, mass effect, or midline shift. No hydrocephalus or extra-axial collection. T2 hyperintense signal in the periventricular white matter and pons, likely the sequela of moderate chronic small vessel ischemic disease. Vascular: Please see MRA findings below. Skull and upper cervical spine: Normal marrow signal. Degenerative changes in the cervical spine, with reversal of the normal cervical lordosis. Sinuses/Orbits: No acute or significant finding. Status post right lens replacement. Other: Trace fluid in right mastoid air cells. MRA HEAD FINDINGS Anterior circulation: Both internal carotid arteries are patent to the termini, with mild irregularity in right greater than left cavernous ICA and moderate narrowing of the right greater than left supraclinoid ICA, likely secondary to atherosclerotic plaque. A1 segments patent, although there may be focal narrowing at the origin of the right greater than A1. Normal anterior communicating artery. Anterior cerebral arteries are patent to their distal aspects. No M1 stenosis or occlusion. Normal MCA bifurcations. Narrowing of a proximal right M2 branch (series 4, image 79). Distal MCA branches perfused and symmetric. There is possible focal stenosis proximally of a small right MCA branch (series 4,  image 84), which appears to supply the right frontal lobe area described above as having restricted diffusion. Posterior circulation: Vertebral arteries patent to the vertebrobasilar junction without stenosis. Posterior inferior cerebral arteries visualized on the left. Basilar patent to its distal aspect. Superior cerebellar arteries patent bilaterally. Diminutive right P1, with near fetal origin of the right PCA. PCAs perfused to their distal aspects without stenosis. The bilateral posterior communicating arteries are visualized.  Anatomic variants: None significant IMPRESSION: 1. Restricted diffusion in the right MCA territory, with associated increased T2 signal, which correlates with the findings seen on the 12/06/2020 CT head and is consistent with subacute infarct. 2. No intracranial large vessel occlusion. Multifocal narrowing in the intracranial ICAs, likely secondary to atherosclerotic plaque. In addition, there is possible focal stenosis proximally of a right MCA branch, which appears to supply the right anterior frontal lobe in the area of infarction. Electronically Signed   By: Merilyn Baba M.D.   On: 12/07/2020 12:44    PHYSICAL EXAM General: Well-developed, well-nourished obese elderly [place] lady patient in no acute distress   NEURO:  Mental Status: AA&Ox3  Speech/Language: speech is without dysarthria or aphasia.    Cranial Nerves:  II: PERRL. Visual fields full.  III, IV, VI: EOMI. Eyelids elevate symmetrically.  V: Sensation is intact to light touch VII: Smile is symmetrical. VIII: hearing intact to voice. IX, X: Phonation is normal.  XII: tongue is midline without fasciculations. Motor: 5/5 strength to all muscle groups tested.  Left shoulder elevation limited due to mechanical pain.  Diminished fine finger movements on the left and oversight of left upper extremity Sensation- Intact to light touch bilaterally. Extinction absent to light touch to DSS. Coordination: No drift.  Gait- deferred  ASSESSMENT/PLAN Ms. Sarafina Puthoff is a 80 y.o. female with history of HFpEF, HTN, HLD, T2DM, hypothyroidism and resection of a mediastinal mass in 9/22 presenting with severe headaches, generalized weakness and dizziness. Patient had been admitted to [facility] for acute on chronic respiratory failure and began having symptoms of generalized weakness, dizziness and severe headaches.  A head CT was done and she was found to have a right frontal lobe stroke.  She was transferred to [facility] for  further stroke workup.  Patient denies having any focal neurologic symptoms, and exam today shows no deficit.  As stoke is embolic-appearing, patient may need a loop recorder after discharge.  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 Yes; Fluid consistency: Thin   aspirin 81 mg daily prior to admission, now on aspirin 81 mg daily and clopidogrel 75 mg daily. ASA and Plavix for 3 weeks, then Plavix alone indefinitely Therapy recommendations:  pending Disposition:  pending  Hypertension Home meds:  none Stable Keep SBP <180 Long-term BP goal normotensive  Hyperlipidemia Home meds:  none, LDL 107, goal < 70 Simvastatin 20 mg daily, increase to 40 mg daily High intensity statin to be added if increased Simvastatin doesn't bring LDL to goal Continue statin at discharge  Diabetes type II Controlled  Home meds:  none HgbA1c 5.7, goal < 7.0 CBGs No results for input(s): GLUCAP in the last 72 hours.  SSI  Acute on chronic respiratory failure Patient is back to baseline 2L/min supplemental O2 requirement Management per primary team  HFpEF Management per primary team Continue lasix  Other Stroke Risk Factors Advanced Age >/= 36  Obesity, Body mass index is 34.82 kg/m., BMI >/= 30 associated with increased stroke risk, recommend weight loss, diet and exercise as appropriate  Congestive heart failure  Other Active Problems Recurrent pleural effusion- stable on chest xray at  this time, management per primary team Hypokalemia 3.2- supplement  Hospital day # [place] , MSN, AGACNP-BC [facility] See Amion for schedule and pager information 12/07/2020 2:00 PM   STROKE MD NOTE :I have personally obtained history,examined this patient, reviewed notes, independently viewed imaging studies, participated in medical decision making and plan of care.ROS completed by me personally and pertinent positives fully documented  I have made any additions or clarifications directly to the above note. Agree with note above.  Patient was admitted with shortness of breath and without focal neurological symptoms except for headache.  CT scan and subsequently MRI confirmed right frontal MCA branch infarct likely of embolic etiology.  Recommend continue ongoing stroke work-up check echocardiogram telemetry monitoring and if negative will need loop recorder placement at discharge for paroxysmal A. fib.  Recommend aspirin Plavix for 3 weeks followed by Plavix alone and aggressive risk factor modification.  Long discussion with patient and daughter at the bedside and answered questions.  Discussed with Dr. [place] Pink.  Greater than 50% time during this 35-minute visit was spent in counseling and coordination of care about embolic stroke discussion about evaluation and treatment plan and answering questions.  Antony Contras, MD Medical Director [place] Pager: [phone] 12/07/2020 4:32 PM   To contact Stroke Continuity provider, please refer to [email or web address] After hours, contact General Neurology

## 2020-12-07 NOTE — Progress Notes (Signed)
Carotid duplex has been completed.   Preliminary results in CV Proc.   Mandy [name] 12/07/2020 2:48 PM

## 2020-12-08 ENCOUNTER — Other Ambulatory Visit: Payer: Self-pay | Admitting: Physician Assistant

## 2020-12-08 DIAGNOSIS — I4891 Unspecified atrial fibrillation: Secondary | ICD-10-CM

## 2020-12-08 LAB — BASIC METABOLIC PANEL
Anion gap: 10 (ref 5–15)
BUN: 18 mg/dL (ref 8–23)
CO2: 32 mmol/L (ref 22–32)
Calcium: 8.7 mg/dL — ABNORMAL LOW (ref 8.9–10.3)
Chloride: 88 mmol/L — ABNORMAL LOW (ref 98–111)
Creatinine, Ser: 1.36 mg/dL — ABNORMAL HIGH (ref 0.44–1.00)
GFR, Estimated: 40 mL/min — ABNORMAL LOW (ref >60)
Glucose, Bld: 92 mg/dL (ref 70–99)
Potassium: 4.7 mmol/L (ref 3.5–5.1)
Sodium: 130 mmol/L — ABNORMAL LOW (ref 135–145)

## 2020-12-08 LAB — CBC
HCT: 24.7 % — ABNORMAL LOW (ref 36.0–46.0)
Hemoglobin: 7.9 g/dL — ABNORMAL LOW (ref 12.0–15.0)
MCH: 26.2 pg (ref 26.0–34.0)
MCHC: 32 g/dL (ref 30.0–36.0)
MCV: 81.8 fL (ref 80.0–100.0)
Platelets: 334 10*3/uL (ref 150–400)
RBC: 3.02 MIL/uL — ABNORMAL LOW (ref 3.87–5.11)
RDW: 16.1 % — ABNORMAL HIGH (ref 11.5–15.5)
WBC: 5.6 10*3/uL (ref 4.0–10.5)
nRBC: 0 % (ref 0.0–0.2)

## 2020-12-08 MED ORDER — CLOPIDOGREL BISULFATE 75 MG PO TABS: 75.0000 mg | ORAL_TABLET | Freq: Every day | ORAL | 2 refills | Status: DC

## 2020-12-08 MED ORDER — ASPIRIN 81 MG PO TBEC
81.0000 mg | DELAYED_RELEASE_TABLET | Freq: Every day | ORAL | 0 refills | Status: AC
Start: 2020-12-08 — End: 2020-12-29

## 2020-12-08 MED ORDER — FUROSEMIDE 20 MG PO TABS: 60.0000 mg | ORAL_TABLET | Freq: Every day | ORAL | 2 refills | Status: DC

## 2020-12-08 MED ORDER — SIMVASTATIN 40 MG PO TABS
40.0000 mg | ORAL_TABLET | Freq: Every day | ORAL | 2 refills | Status: DC
Start: 2020-12-08 — End: 2021-02-22

## 2020-12-08 MED ORDER — POTASSIUM CHLORIDE CRYS ER 20 MEQ PO TBCR: 20.0000 meq | EXTENDED_RELEASE_TABLET | Freq: Every day | ORAL | 1 refills | Status: DC

## 2020-12-08 NOTE — TOC Transition Note (Signed)
Transition of Care [facility]) - CM/SW Discharge Note   Patient Details  Name: Mandy [name] MRN: [id number] Date of Birth: 25-Jan-1940  Transition of Care [facility]) CM/SW Contact:  Pollie Friar, RN Phone Number: 12/08/2020, 4:26 PM   Clinical Narrative:    Patient is discharging home with her daughter. Pt has all recommended DME except the wheelchair. [employer] has delivered a wheelchair to the room. NIV for home is still pending insurance authorization. MD aware and feels pt will be ok at home until approved and delivered. Pt has pulmonary follow up. Pt has oxygen already at home.  Home health with [facility] per daughter request.  Daughter to provide transport home.    Final next level of care: Home w Home Health Services Barriers to Discharge: No Barriers Identified   Patient Goals and CMS Choice   CMS Medicare.gov Compare Post Acute Care list provided to:: Patient Represenative (must comment) Choice offered to / list presented to : Adult Children  Discharge Placement                       Discharge Plan and Services                DME Arranged: Lightweight manual wheelchair with seat cushion DME Agency: [employer] Date DME Agency Contacted: 12/08/20   Representative spoke with at DME Agency: Freda Munro HH Arranged: PT, OT, RN, Social Work [facility] Agency: [place] Date [place]: 12/08/20   Representative spoke with at [place]: [place] Interventions     Readmission Risk Interventions Readmission Risk Prevention Plan 11/06/2020  Transportation Screening Complete  PCP or Specialist Appt within 3-5 Days Complete  HRI or [place] Complete  Social Work Consult for [place] Planning/Counseling Complete  Palliative Care Screening Complete  Medication Review Press photographer) Complete  Some recent data might be hidden

## 2020-12-08 NOTE — Progress Notes (Signed)
Patient suffers from weakness which impairs their ability to perform daily activities like bathing and grooming in the home.  A walker will not resolve  issue with performing activities of daily living. A wheelchair will allow patient to safely perform daily activities. Patient is not able to propel themselves in the home using a standard weight wheelchair due to endurance. Patient can self propel in the lightweight wheelchair. Length of need Lifetime.  Accessories: elevating leg rests (ELRs), wheel locks, extensions and anti-tippers. 

## 2020-12-08 NOTE — Progress Notes (Signed)
Occupational Therapy Treatment Patient Details Name: Mandy [name] MRN: [id number] DOB: September 05, 1940 Today's Date: 12/08/2020   History of present illness 80yo female who presented on 11/21 with increased SOB and DOE, difficulty maintaining SpO2 in the 90s with activity. Of note, she had a recent hospitalization for pleural effusion and SOB. Admitted with acute respiratory failure with hypoxia and hypercapnia, R pleural effusion, and acute on chronic heart failure. PMH OA, DM, HLD, HTN, CHF, osteoporosis   OT comments  Pt is progressing well with plans to d/c home today. Pt declined ADLs this session, stating her daughter is bringing her clothes later. Pt completed bed mobility with supervision and step pivot transfers with close min guard. RW place in front of pt, however she did not use of transfer, instead supporting BUE on recliner arms. Communication barrier made it difficulty to re-direct. Visually demonstrated transfer with RW for pt. Transfer to [facility] with RW, pt demonstrated better safety. Pt was quickly SOB after all functional tasks and required sitting rest break after all position changes.    Recommendations for follow up therapy are one component of a multi-disciplinary discharge planning process, led by the attending physician.  Recommendations may be updated based on patient status, additional functional criteria and insurance authorization.    Follow Up Recommendations  Home health OT    Assistance Recommended at Discharge Intermittent Supervision/Assistance        Precautions / Restrictions Precautions Precautions: Fall Precaution Comments: home oxygen 2LO2 via [place] Restrictions Weight Bearing Restrictions: No       Mobility Bed Mobility Overal bed mobility: Needs Assistance Bed Mobility: Supine to Sit     Supine to sit: Supervision     General bed mobility comments: supervision with bed flat, no rails needed. Pt slightly SOB once sitting EOB    Transfers Overall  transfer level: Needs assistance Equipment used: None Transfers: Sit to/from Stand;Bed to chair/wheelchair/BSC Sit to Stand: Min guard Stand pivot transfers: Min guard         General transfer comment: min guard, no physical assist needed. pt not using RW despite it being placed right in front of her     Balance Overall balance assessment: Needs assistance Sitting-balance support: Feet supported Sitting balance-Leahy Scale: Good     Standing balance support: Bilateral upper extremity supported;During functional activity Standing balance-Leahy Scale: Poor                             ADL either performed or assessed with clinical judgement   ADL Overall ADL's : Needs assistance/impaired                         Toilet Transfer: Loss adjuster, chartered Details (indicate cue type and reason): pt completing transfer without RW despite RW being right in front of her. with language barrier, difficult to re-direct to use RW         Functional mobility during ADLs: Min guard;Rolling walker (2 wheels) General ADL Comments: Attempted dressing this session, no clothes in room. Pt stating her daughter will bring clothes later today. SpO2 above >98% on 3L [place], but pt SOB after brief funcitonal activity    Extremity/Trunk Assessment Upper Extremity Assessment Upper Extremity Assessment: Generalized weakness   Lower Extremity Assessment Lower Extremity Assessment: Defer to PT evaluation        Vision   Vision Assessment?: No apparent visual deficits   Perception Perception Perception:  Within Functional Limits   Praxis Praxis Praxis: Intact    Cognition Arousal/Alertness: Awake/alert Behavior During Therapy: WFL for tasks assessed/performed Overall Cognitive Status: Difficult to assess                                 General Comments: AxO x 3 very pleasant lady but does NOT speak English                General  Comments VSS on 3L [place]    Pertinent Vitals/ Pain       Pain Assessment: No/denies pain Pain Intervention(s): Monitored during session   Frequency  Min 2X/week        Progress Toward Goals  OT Goals(current goals can now be found in the care plan section)  Progress towards OT goals: Progressing toward goals  Acute Rehab OT Goals Patient Stated Goal: home OT Goal Formulation: With family Time For Goal Achievement: 12/13/20 Potential to Achieve Goals: Good ADL Goals Pt Will Perform Lower Body Dressing: with set-up;with adaptive equipment;sitting/lateral leans;sit to/from stand;with supervision Pt Will Transfer to Toilet: with supervision;ambulating;regular height toilet Pt Will Perform Toileting - Clothing Manipulation and hygiene: with caregiver independent in assisting;with supervision;sitting/lateral leans;sit to/from stand;with adaptive equipment Additional ADL Goal #1: Pt will engage in at least 10 min seated functional activities with good dynamic sitting balance, and with vitals stable in order to demonstrate improved activity tolerance and balance needed to perform ADLs safely at home.  RPE report no greater than 5/10. Additional ADL Goal #2: Patient will identify at least 3 energy conservation strategies to employ at home in order to maximize function and quality of life and decrease caregiver burden while preventing exacerbation of symptoms and rehospitalization.  Plan Discharge plan remains appropriate       AM-PAC OT "6 Clicks" Daily Activity     Outcome Measure   Help from another person eating meals?: None Help from another person taking care of personal grooming?: None Help from another person toileting, which includes using toliet, bedpan, or urinal?: A Little Help from another person bathing (including washing, rinsing, drying)?: A Lot Help from another person to put on and taking off regular upper body clothing?: A Little Help from another person to put on and  taking off regular lower body clothing?: A Lot 6 Click Score: 18    End of Session Equipment Utilized During Treatment: Oxygen  OT Visit Diagnosis: Unsteadiness on feet (R26.81);Pain;Muscle weakness (generalized) (M62.81)   Activity Tolerance Patient tolerated treatment well   Patient Left in chair;with call bell/phone within reach;with chair alarm set;with family/visitor present   Nurse Communication Mobility status        Time: 0076-2263 OT Time Calculation (min): 18 min  Charges: OT General Charges $OT Visit: 1 Visit OT Treatments $Therapeutic Activity: 8-22 mins   Tatianna Ibbotson A Daisja Kessinger 12/08/2020, 1:53 PM

## 2020-12-08 NOTE — Progress Notes (Signed)
Order for event monitor placed

## 2020-12-08 NOTE — Progress Notes (Signed)
STROKE TEAM PROGRESS NOTE   INTERVAL HISTORY Patient is seen in her room with nobody at the bedside.   Carotid ultrasound showed no significant extracranial stenosis.  EP team has been consulted for loop recorder but recommend doing 30-day heart monitor first.  Vital signs stable.  Neurological exam unchanged Vitals:   12/08/20 0200 12/08/20 0428 12/08/20 0849 12/08/20 1321  BP: (!) 157/67  (!) 127/49 (!) 138/52  Pulse: 72  64 68  Resp:   18 18  Temp: 97.9 F (36.6 C)  98 F (36.7 C) 99.1 F (37.3 C)  TempSrc: Oral  Oral Oral  SpO2: 97%  100% 100%  Weight:  78.9 kg    Height:       CBC:  Recent Labs  Lab 12/07/20 0144 12/08/20 0147  WBC 5.2 5.6  HGB 8.1* 7.9*  HCT 24.8* 24.7*  MCV 80.5 81.8  PLT 341 782   Basic Metabolic Panel:  Recent Labs  Lab 12/02/20 1046 12/03/20 0356 12/07/20 0144 12/08/20 0147  NA 130*   < > 130* 130*  K 3.5   < > 3.2* 4.7  CL 86*   < > 86* 88*  CO2 37*   < > 37* 32  GLUCOSE 164*   < > 98 92  BUN 21   < > 16 18  CREATININE 1.33*   < > 1.33* 1.36*  CALCIUM 8.3*   < > 8.5* 8.7*  MG 2.0  --   --   --    < > = values in this interval not displayed.   Lipid Panel:  Recent Labs  Lab 12/07/20 0144  CHOL 170  TRIG 50  HDL 53  CHOLHDL 3.2  VLDL 10  LDLCALC 107*   HgbA1c:  Recent Labs  Lab 12/07/20 0144  HGBA1C 5.7*   Urine Drug Screen: No results for input(s): LABOPIA, COCAINSCRNUR, LABBENZ, AMPHETMU, THCU, LABBARB in the last 168 hours.  Alcohol Level No results for input(s): ETH in the last 168 hours.  IMAGING past 24 hours VAS US CAROTID  Result Date: 12/07/2020 Carotid Arterial Duplex Study Patient Name:  Mandy [name]  Date of Exam:   12/07/2020 Medical Rec #: [id number]       Accession #:    [id number] Date of Birth: 10-Mar-1940        Patient Gender: F Patient Age:   80 years Exam Location:  [facility] Procedure:      VAS US CAROTID Referring Phys: Mandy [name]  --------------------------------------------------------------------------------  Indications:       CVA. Risk Factors:      Hypertension, hyperlipidemia, Diabetes. Comparison Study:  no prior Performing Technologist: Archie Patten RVS  Examination Guidelines: A complete evaluation includes B-mode imaging, spectral Doppler, color Doppler, and power Doppler as needed of all accessible portions of each vessel. Bilateral testing is considered an integral part of a complete examination. Limited examinations for reoccurring indications may be performed as noted.  Right Carotid Findings: +----------+--------+--------+--------+------------------+--------+           PSV cm/sEDV cm/sStenosisPlaque DescriptionComments +----------+--------+--------+--------+------------------+--------+ CCA Prox  62      13              heterogenous               +----------+--------+--------+--------+------------------+--------+ CCA Distal78      16              heterogenous               +----------+--------+--------+--------+------------------+--------+  ICA Prox  66      12      1-39%   heterogenous               +----------+--------+--------+--------+------------------+--------+ ICA Distal75      25                                         +----------+--------+--------+--------+------------------+--------+ ECA       104                                                +----------+--------+--------+--------+------------------+--------+ +----------+--------+-------+--------+-------------------+           PSV cm/sEDV cmsDescribeArm Pressure (mmHG) +----------+--------+-------+--------+-------------------+ [id number]                                        +----------+--------+-------+--------+-------------------+ +---------+--------+--+--------+--+---------+ VertebralPSV cm/s79EDV cm/s24Antegrade +---------+--------+--+--------+--+---------+  Left Carotid Findings:  +----------+--------+--------+--------+------------------+--------+           PSV cm/sEDV cm/sStenosisPlaque DescriptionComments +----------+--------+--------+--------+------------------+--------+ CCA Prox  93      17              heterogenous               +----------+--------+--------+--------+------------------+--------+ CCA Distal106     32              heterogenous               +----------+--------+--------+--------+------------------+--------+ ICA Prox  116     28      1-39%   heterogenous               +----------+--------+--------+--------+------------------+--------+ ICA Distal82      30                                         +----------+--------+--------+--------+------------------+--------+ ECA       114                                                +----------+--------+--------+--------+------------------+--------+ +----------+--------+--------+--------+-------------------+           PSV cm/sEDV cm/sDescribeArm Pressure (mmHG) +----------+--------+--------+--------+-------------------+ [id number]                                         +----------+--------+--------+--------+-------------------+ +---------+--------+--+--------+--+---------+ VertebralPSV cm/s87EDV cm/s26Antegrade +---------+--------+--+--------+--+---------+   Summary: Right Carotid: Velocities in the right ICA are consistent with a 1-39% stenosis. Left Carotid: Velocities in the left ICA are consistent with a 1-39% stenosis. Vertebrals: Bilateral vertebral arteries demonstrate antegrade flow. *See table(s) above for measurements and observations.  Electronically signed by Antony Contras MD on 12/07/2020 at 5:04:27 PM.    Final     PHYSICAL EXAM General: Well-developed, well-nourished obese elderly [place] lady patient in no acute distress   NEURO:  Mental Status: AA&Ox3  Speech/Language: speech is without dysarthria or aphasia.    Cranial Nerves:  II: PERRL.  Visual fields full.  III, IV, VI: EOMI. Eyelids elevate symmetrically.  V: Sensation is intact to light touch VII: Smile is symmetrical. VIII: hearing intact to voice. IX, X: Phonation is normal.  XII: tongue is midline without fasciculations. Motor: 5/5 strength to all muscle groups tested.  Left shoulder elevation limited due to mechanical pain.  Diminished fine finger movements on the left and oversight of left upper extremity Sensation- Intact to light touch bilaterally. Extinction absent to light touch to DSS. Coordination: No drift.  Gait- deferred  ASSESSMENT/PLAN Ms. Mandy [name] is a 80 y.o. female with history of HFpEF, HTN, HLD, T2DM, hypothyroidism and resection of a mediastinal mass in 9/22 presenting with severe headaches, generalized weakness and dizziness. Patient had been admitted to [facility] for acute on chronic respiratory failure and began having symptoms of generalized weakness, dizziness and severe headaches.  A head CT was done and she was found to have a right frontal lobe stroke.  She was transferred to [facility] for further stroke workup.  Patient denies having any focal neurologic symptoms, and exam today shows no deficit.  As stoke is embolic-appearing, patient may need a loop recorder after discharge.  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 Yes; Fluid consistency: Thin   aspirin 81 mg daily prior to admission, now on aspirin 81 mg daily and  clopidogrel 75 mg daily. ASA and Plavix for 3 weeks, then Plavix alone indefinitely Therapy recommendations:  pending Disposition:  pending  Hypertension Home meds:  none Stable Keep SBP <180 Long-term BP goal normotensive  Hyperlipidemia Home meds:  none, LDL 107, goal < 70 Simvastatin 20 mg daily, increase to 40 mg daily High intensity statin to be added if increased Simvastatin doesn't bring LDL to goal Continue statin at discharge  Diabetes type II Controlled Home meds:  none HgbA1c 5.7, goal < 7.0 CBGs No results for input(s): GLUCAP in the last 72 hours.  SSI  Acute on chronic respiratory failure Patient is back to baseline 2L/min supplemental O2 requirement Management per primary team  HFpEF Management per primary team Continue lasix  Other Stroke Risk Factors Advanced Age >/= 35  Obesity, Body mass index is 35.13 kg/m., BMI >/= 30 associated with increased stroke risk, recommend weight loss, diet and exercise as appropriate  Congestive heart failure  Other Active Problems Recurrent pleural effusion- stable on chest xray at this time, management per primary team Hypokalemia 3.2- supplement  Hospital day # 15   .  Continue mobilization out of bed and ongoing therapies.  30-day heart monitor per cardiology first and if negative then loop recorder after discharge.  Continue aspirin and Plavix for 3 weeks followed by Plavix alone.  Follow-up as an outpatient stroke clinic in 2 months.  Stroke team will sign off.  Kindly call for questions.  Discussed with Dr. [place] Pink.  Greater than 50% time during this 25-minute visit was spent on counseling and coordination of care about her embolic stroke and discussion with care team and answering questions.  Antony Contras, MD Medical Director [facility] Pager: [phone] 12/08/2020 1:26 PM   To contact Stroke Continuity provider, please refer to [email or web address] After hours, contact General Neurology

## 2020-12-08 NOTE — Plan of Care (Signed)
  Problem: Education: Goal: Knowledge of General Education information will improve Description: Including pain rating scale, medication(s)/side effects and non-pharmacologic comfort measures Outcome: Progressing   Problem: Health Behavior/Discharge Planning: Goal: Ability to manage health-related needs will improve Outcome: Progressing   Problem: Clinical Measurements: Goal: Ability to maintain clinical measurements within normal limits will improve Outcome: Progressing Goal: Will remain free from infection Outcome: Progressing Goal: Diagnostic test results will improve Outcome: Progressing Goal: Respiratory complications will improve Outcome: Progressing Goal: Cardiovascular complication will be avoided Outcome: Progressing   Problem: Activity: Goal: Risk for activity intolerance will decrease Outcome: Progressing   Problem: Nutrition: Goal: Adequate nutrition will be maintained Outcome: Progressing   Problem: Coping: Goal: Level of anxiety will decrease Outcome: Progressing   Problem: Elimination: Goal: Will not experience complications related to bowel motility Outcome: Progressing Goal: Will not experience complications related to urinary retention Outcome: Progressing   Problem: Pain Managment: Goal: General experience of comfort will improve Outcome: Progressing   Problem: Safety: Goal: Ability to remain free from injury will improve Outcome: Progressing   Problem: Skin Integrity: Goal: Risk for impaired skin integrity will decrease Outcome: Progressing   Problem: Education: Goal: Ability to demonstrate management of disease process will improve Outcome: Progressing Goal: Ability to verbalize understanding of medication therapies will improve Outcome: Progressing Goal: Individualized Educational Video(s) Outcome: Progressing   Problem: Activity: Goal: Capacity to carry out activities will improve Outcome: Progressing   Problem: Cardiac: Goal:  Ability to achieve and maintain adequate cardiopulmonary perfusion will improve Outcome: Progressing   Problem: Education: Goal: Knowledge of disease or condition will improve Outcome: Progressing Goal: Knowledge of secondary prevention will improve (SELECT ALL) Outcome: Progressing Goal: Knowledge of patient specific risk factors will improve (INDIVIDUALIZE FOR PATIENT) Outcome: Progressing Goal: Individualized Educational Video(s) Outcome: Progressing   Problem: Coping: Goal: Will verbalize positive feelings about self Outcome: Progressing Goal: Will identify appropriate support needs Outcome: Progressing   Problem: Health Behavior/Discharge Planning: Goal: Ability to manage health-related needs will improve Outcome: Progressing   Problem: Self-Care: Goal: Ability to participate in self-care as condition permits will improve Outcome: Progressing Goal: Verbalization of feelings and concerns over difficulty with self-care will improve Outcome: Progressing Goal: Ability to communicate needs accurately will improve Outcome: Progressing   Problem: Nutrition: Goal: Risk of aspiration will decrease Outcome: Progressing   Problem: Ischemic Stroke/TIA Tissue Perfusion: Goal: Complications of ischemic stroke/TIA will be minimized Outcome: Progressing

## 2020-12-08 NOTE — Consult Note (Addendum)
ELECTROPHYSIOLOGY CONSULT NOTE  Patient ID: Mandy [name] MRN: [id number], DOB/AGE: 79-Sep-1942   Admit date: 11/23/2020 Date of Consult: 12/08/2020  Primary Physician: [name], MD Primary Cardiologist: none Reason for Consultation: Cryptogenic stroke -; recommendations regarding Implantable Loop Recorder, requested by Dr. Leonie Man  Patient speaks [place] dialect not available on our translation services, family has been interpreting for the patient  History of Present Illness Mandy [name] was admitted on 11/23/2020 with with SOB, initially admitted for acute/chronic CHF, cardiology consulted for mod-severe pulmonary HTN and c/o CP, she had a thoracentesis done (as she has in the past) and had R/LHC,  no obstructive CAD noted and filling pressures optimized by cath, maintained on diuretics with recs for out patient follow up, cardiology signed off 12/01/20.    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  she has undergone workup for stroke including echocardiogram and carotid dopplers.  The patient has been monitored on telemetry which has demonstrated sinus rhythm with no arrhythmias.   Neurology has deferred TEE  [facility] 11/30/20   Mid LAD lesion is 30% stenosed.   Dist LAD lesion is 30% stenosed.   LV end diastolic pressure is low.   LV end diastolic pressure is normal.   The left ventricular ejection fraction is 55-65% by visual estimate.   Hemodynamic findings consistent with moderate pulmonary hypertension.   1.  Mild obstructive coronary artery disease. 2.  Preserved  cardiac output of 6.1 L/min and an index of 3.55 L/min/m. 3.  Mean pulmonary artery pressure of 39 mmHg with pulmonary vascular resistance of 3.44 Woods units; transpulmonary gradient of 5mmHg (mean wedge pressure 30mmHg) 4.  LVEDP of 9 mmHg with preserved LVEF.   Recommendation: Medical therapy.   Echo 11/24/20 1. Left ventricular ejection fraction, by estimation, is 55 to 60%. The left ventricle has normal function. There is mild left ventricular hypertrophy.   2. Right ventricular systolic function is mildly reduced. The right ventricular size is normal. There is severely elevated pulmonary artery systolic pressure. The estimated right ventricular systolic pressure is 02.5 mmHg.   3. Left atrial size was mild to moderately dilated.   4. Right atrial size was mildly dilated.   5. The mitral valve is normal in structure. Mild to moderate mitral valve  regurgitation. No evidence of mitral stenosis.   6. The aortic valve is tricuspid. Aortic valve regurgitation is not visualized. No aortic stenosis is present.   7. The inferior vena cava is dilated in size with <50% respiratory variability, suggesting right atrial pressure of 15 mmHg.   8. Left pleural effusion noted.    Lab work is reviewed. Deferred to IM/attending Anemia hyponatremia AKI/CKD   Prior to admission, the patient denies chest pain, shortness of breath, dizziness, palpitations, or syncope.  They are recovering from their stroke with plans to home at discharge.      Past Medical History:  Diagnosis Date   Aortic atherosclerosis ([place]) 10/08/2018   Arthritis    Diabetes mellitus without complication (HCC)    GERD (gastroesophageal reflux disease)    Hepatic steatosis 10/08/2018   Hyperlipidemia    Hypertension    Hyperthyroidism    Mass of  right ovary 10/08/2018   Osteoporosis    Pneumonia    SBO (small bowel obstruction) ([place]) 08/09/2018   SBO (small bowel obstruction) ([place]) 08/09/2018     Surgical  History:  Past Surgical History:  Procedure Laterality Date   CATARACT EXTRACTION W/ INTRAOCULAR LENS  IMPLANT, BILATERAL     COLONOSCOPY     LEFT HEART CATH AND CORONARY ANGIOGRAPHY N/A 11/30/2020   Procedure: LEFT HEART CATH AND CORONARY ANGIOGRAPHY;  Surgeon: Early Osmond, MD;  Location: [facility];  Service: Cardiovascular;  Laterality: N/A;   RIGHT/LEFT HEART CATH AND CORONARY ANGIOGRAPHY N/A 11/30/2020   Procedure: RIGHT/LEFT HEART CATH AND CORONARY ANGIOGRAPHY;  Surgeon: Early Osmond, MD;  Location: [facility];  Service: Cardiovascular;  Laterality: N/A;     Medications Prior to Admission  Medication Sig Dispense Refill Last Dose   acetaminophen (TYLENOL) 500 MG tablet Take 1,000 mg by mouth every 6 (six) hours as needed for fever or headache (pain).   11/22/2020   albuterol (PROVENTIL) (2.5 MG/3ML) 0.083% nebulizer solution Take 2.5 mg by nebulization every 6 (six) hours as needed for wheezing or shortness of breath.   11/23/2020 at am   albuterol (VENTOLIN HFA) 108 (90 Base) MCG/ACT inhaler Inhale 2 puffs into the lungs every 6 (six) hours as needed for wheezing or shortness of breath. 18 g 0 Past Week   alendronate (FOSAMAX) 70 MG tablet Take 70 mg by mouth every Tuesday. Take with a full glass of water on an empty stomach.   11/17/2020   aspirin EC 81 MG tablet Take 81 mg by mouth daily. Swallow whole.   11/22/2020   atenolol (TENORMIN) 50 MG tablet Take 50 mg by mouth 2 (two) times daily.   11/23/2020 at 0800   calcium carbonate (OSCAL) 1500 (600 Ca) MG TABS tablet Take 600 mg of elemental calcium by mouth 2 (two) times daily with a meal.   11/22/2020   diazepam (VALIUM) 2 MG tablet Take 2 mg by mouth every 12 (twelve) hours as needed for anxiety.   11/22/2020   diclofenac sodium (VOLTAREN) 1 % GEL Apply 4 g topically 4 (four) times daily as needed for pain. shoulder   11/22/2020   furosemide (LASIX) 40 MG tablet Take 1 tablet (40 mg total) by mouth 2 (two)  times daily. (Patient taking differently: Take 40 mg by mouth every morning.) 60 tablet 1 11/23/2020 at am   gabapentin (NEURONTIN) 100 MG capsule Take 100 mg by mouth at bedtime.   11/22/2020   guaiFENesin (MUCINEX) 600 MG 12 hr tablet Take 600 mg by mouth 2 (two) times daily as needed for cough.   Past Week   hydrALAZINE (APRESOLINE) 100 MG tablet Take 0.5 tablets (50 mg total) by mouth 3 (three) times daily. 90 tablet 1 11/23/2020 at am   melatonin 5 MG TABS Take 10 mg by mouth at bedtime.   11/22/2020   methimazole (TAPAZOLE) 5 MG tablet Take 0.5 tablets (2.5 mg total) by mouth daily. 30 tablet 0 11/23/2020 at am   Multiple Vitamin (MULTIVITAMIN WITH MINERALS) TABS tablet Take 1 tablet by mouth daily.   11/22/2020   OXYGEN Inhale 2-4 L into the lungs continuous.   11/23/2020   pantoprazole (PROTONIX) 40 MG tablet Take 1 tablet (40 mg total) by mouth daily at 6 (six) AM. (Patient taking differently: Take 40 mg by mouth every morning.) 30 tablet 0 11/23/2020 at am   Pseudoeph-CPM-DM-APAP (THERAFLU FLU/COLD/COUGH PO) Take 30 mLs by  mouth 2 (two) times daily as needed (cough/congestion).   11/23/2020 at am   vitamin B-12 (CYANOCOBALAMIN) 1000 MCG tablet Take 1,000 mcg by mouth daily.   11/22/2020   vitamin C (ASCORBIC ACID) 500 MG tablet Take 500 mg by mouth daily.   11/22/2020   Vitamin D, Cholecalciferol, 25 MCG (1000 UT) TABS Take 1,000 Units by mouth daily.   11/22/2020   zinc gluconate 50 MG tablet Take 50 mg by mouth daily.   11/22/2020   [DISCONTINUED] simvastatin (ZOCOR) 20 MG tablet Take 1 tablet (20 mg total) by mouth at bedtime. 30 tablet 0 11/22/2020   acetaminophen (TYLENOL) 325 MG tablet Take 2 tablets (650 mg total) by mouth every 6 (six) hours as needed for mild pain, fever or headache. (Patient not taking: Reported on 11/23/2020)   Not Taking   MELATONIN ER PO Take 2 tablets by mouth at bedtime as needed (sleep). (Patient not taking: Reported on 11/23/2020)   Not Taking   traMADol  (ULTRAM) 50 MG tablet Take 1 tablet (50 mg total) by mouth every 6 (six) hours as needed (mild pain). (Patient not taking: Reported on 11/23/2020) 28 tablet 0 Not Taking    Inpatient Medications:   aspirin EC  81 mg Oral Daily   atenolol  50 mg Oral BID   clopidogrel  75 mg Oral Daily   furosemide  60 mg Oral Daily   gabapentin  100 mg Oral QHS   hydrALAZINE  50 mg Oral TID   mouth rinse  15 mL Mouth Rinse BID   melatonin  10 mg Oral QHS   methimazole  2.5 mg Oral Daily   pantoprazole  40 mg Oral Q0600   simvastatin  40 mg Oral QHS   sodium chloride flush  3 mL Intravenous Q12H    Allergies: No Known Allergies  Social History   Socioeconomic History   Marital status: Widowed    Spouse name: Not on file   Number of children: Not on file   Years of education: Not on file   Highest education level: Not on file  Occupational History   Not on file  Tobacco Use   Smoking status: Never   Smokeless tobacco: Never  Vaping Use   Vaping Use: Never used  Substance and Sexual Activity   Alcohol use: No   Drug use: No   Sexual activity: Not on file  Other Topics Concern   Not on file  Social History Narrative   Patient is from [place]   Speaks Krio   Social Determinants of Health   Financial Resource Strain: Not on file  Food Insecurity: Not on file  Transportation Needs: Not on file  Physical Activity: Not on file  Stress: Not on file  Social Connections: Not on file  Intimate Partner Violence: Not on file     Family History  Problem Relation Age of Onset   Kidney disease Son    Colon cancer Neg Hx    Breast cancer Neg Hx       Review of Systems: All other systems reviewed and are otherwise negative except as noted above.  Physical Exam: Vitals:   12/08/20 0000 12/08/20 0200 12/08/20 0428 12/08/20 0849  BP:  (!) 157/67  (!) 127/49  Pulse: 64 72  64  Resp: 18   18  Temp:  97.9 F (36.6 C)  98 F (36.7 C)  TempSrc:  Oral  Oral  SpO2: 100% 97%  100%   Weight:  78.9 kg   Height:        GEN- The patient is well appearing, alert and oriented x 3 today, eating lunch.   Head- normocephalic, atraumatic Eyes-  Sclera clear, conjunctiva pink Ears- hearing intact Oropharynx- clear Neck- supple Lungs- CTA b/l, normal work of breathing Heart- RRR, no murmurs, rubs or gallops  GI- soft, NT, ND, + BS Extremities- no clubbing, cyanosis, or edema MS- no significant deformity or atrophy Skin- no rash or lesion Psych- euthymic mood, full affect   Labs:   Lab Results  Component Value Date   WBC 5.6 12/08/2020   HGB 7.9 (L) 12/08/2020   HCT 24.7 (L) 12/08/2020   MCV 81.8 12/08/2020   PLT 334 12/08/2020    Recent Labs  Lab 12/07/20 0144 12/08/20 0147  NA 130* 130*  K 3.2* 4.7  CL 86* 88*  CO2 37* 32  BUN 16 18  CREATININE 1.33* 1.36*  CALCIUM 8.5* 8.7*  PROT 5.6*  --   BILITOT 0.5  --   ALKPHOS 30*  --   ALT 7  --   AST 13*  --   GLUCOSE 98 92   No results found for: CKTOTAL, CKMB, CKMBINDEX, TROPONINI Lab Results  Component Value Date   CHOL 170 12/07/2020   CHOL 204 (H) 04/09/2020   CHOL 170 11/30/2009   Lab Results  Component Value Date   HDL 53 12/07/2020   HDL 65 04/09/2020   HDL 61 11/30/2009   Lab Results  Component Value Date   LDLCALC 107 (H) 12/07/2020   LDLCALC 114 (H) 04/09/2020   LDLCALC 93 11/30/2009   Lab Results  Component Value Date   TRIG 50 12/07/2020   TRIG 57 06/26/2020   TRIG 141 04/09/2020   Lab Results  Component Value Date   CHOLHDL 3.2 12/07/2020   CHOLHDL 3.1 04/09/2020   CHOLHDL 2.8 Ratio 11/30/2009   No results found for: LDLDIRECT  Lab Results  Component Value Date   DDIMER 1.49 (H) 06/30/2020     Radiology/Studies:    CT HEAD WO CONTRAST (5MM) Result Date: 12/06/2020 CLINICAL DATA:  80 year old female with dizziness. EXAM: CT HEAD WITHOUT CONTRAST TECHNIQUE: Contiguous axial images were obtained from the base of the skull through the vertex without intravenous  contrast. COMPARISON:  Head CT 09/24/2020. FINDINGS: Brain: Patchy and confluent bilateral cerebral white matter hypodensity compatible with chronic small vessel disease. But in the anterior right MCA territory there is new cytotoxic edema or developing encephalomalacia since September (series 2, image 24) in an area of about 18 mm. No associated hemorrhage or mass effect. No changes of recent cortically based infarct elsewhere. Stable gray-white matter differentiation elsewhere. No superimposed shift, ventriculomegaly, mass effect, evidence of mass lesion, intracranial hemorrhage. Vascular: Calcified atherosclerosis at the skull base. No suspicious intracranial vascular hyperdensity. Skull: No acute osseous abnormality identified. Sinuses/Orbits: Visualized paranasal sinuses and mastoids are stable and well aerated. Tympanic cavities are clear. Other: No acute orbit or scalp soft tissue finding. IMPRESSION: 1. 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 This is new since September. No associated hemorrhage or mass effect. 2. Otherwise stable CT appearance of chronic small vessel disease. These results will be called to the ordering clinician or representative by the Radiologist Assistant, and communication documented in the PACS or [employer]. Electronically Signed   By: Genevie Ann M.D.   On: 12/06/2020 11:54   CT Angio Chest PE W and/or Wo Contrast Result Date: 11/23/2020 CLINICAL DATA:  Difficulty breathing. EXAM: CT ANGIOGRAPHY CHEST WITH CONTRAST TECHNIQUE: Multidetector CT imaging of the chest was performed using the standard protocol during bolus administration of intravenous contrast. Multiplanar CT image reconstructions and MIPs were obtained to evaluate the vascular anatomy. CONTRAST:  58mL OMNIPAQUE IOHEXOL 350 MG/ML SOLN COMPARISON:  October 02, 2020. FINDINGS: Cardiovascular: Satisfactory opacification of the pulmonary arteries to the segmental level. No evidence of  pulmonary embolism. Moderate cardiomegaly is noted. 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 No pericardial effusion. Atherosclerosis of thoracic aorta is noted without dissection. Mediastinum/Nodes: No enlarged mediastinal, hilar, or axillary lymph nodes. Thyroid gland, trachea, and esophagus demonstrate no significant findings. Lungs/Pleura: No pneumothorax is noted. Moderate right pleural effusion is noted with associated atelectasis of the right lower lobe. Mild subsegmental atelectasis is seen involving the right middle lobe and lingular segment of the left upper lobe. Upper Abdomen: No acute abnormality. Musculoskeletal: No chest wall abnormality. No acute or significant osseous findings. Review of the MIP images confirms the above findings. IMPRESSION: No definite evidence of pulmonary embolus. Moderate size right pleural effusion is noted with associated atelectasis of the right lower lobe. Mild subsegmental atelectasis is also noted in the right middle lobe and lingular segment of left upper lobe. 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 Aortic Atherosclerosis (ICD10-I70.0). Electronically Signed   By: Marijo Conception M.D.   On: 11/23/2020 13:47   MR ANGIO HEAD WO CONTRAST Result Date: 12/07/2020 CLINICAL DATA:  Stroke, follow-up EXAM: MRI HEAD WITHOUT CONTRAST MRA HEAD WITHOUT CONTRAST TECHNIQUE: Multiplanar, multi-echo pulse sequences of the brain and surrounding structures were acquired without intravenous contrast. Angiographic images of the Circle of Willis were acquired using MRA technique without intravenous contrast. COMPARISON:  CT head 12/06/2020, correlation is also made with MRI head 06/02/2005 FINDINGS: MRI HEAD FINDINGS Brain: Wedge-shaped area of restricted diffusion with ADC correlate in the right frontal lobe in the right MCA territory (series 5, images 19-24), consistent with the findings seen on the  12/06/2020 CT head. This area is associated with increased T2 signal. No acute hemorrhage, mass, mass effect, or midline shift. No hydrocephalus or extra-axial collection. T2 hyperintense signal in the periventricular white matter and pons, likely the sequela of moderate chronic small vessel ischemic disease. Vascular: Please see MRA findings below. Skull and upper cervical spine: Normal marrow signal. Degenerative changes in the cervical spine, with reversal of the normal cervical lordosis. Sinuses/Orbits: No acute or significant finding. Status post right lens replacement. Other: Trace fluid in right mastoid air cells. MRA HEAD FINDINGS Anterior circulation: Both internal carotid arteries are patent to the termini, with mild irregularity in right greater than left cavernous ICA and moderate narrowing of the right greater than left supraclinoid ICA, likely secondary to atherosclerotic plaque. A1 segments patent, although there may be focal narrowing at the origin of the right greater than A1. Normal anterior communicating artery. Anterior cerebral arteries are patent to their distal aspects. No M1 stenosis or occlusion. Normal MCA bifurcations. Narrowing of a proximal right M2 branch (series 4, image 79). Distal MCA branches perfused and symmetric. There is possible focal stenosis proximally of a small right MCA branch (series 4, image 84), which appears to supply the right frontal lobe area described above as having restricted diffusion. Posterior circulation: Vertebral arteries patent to the vertebrobasilar junction without stenosis. Posterior inferior cerebral arteries visualized on the left. Basilar patent to its distal aspect. Superior cerebellar arteries patent bilaterally. Diminutive right P1, with near  fetal origin of the right PCA. PCAs perfused to their distal aspects without stenosis. The bilateral posterior communicating arteries are visualized. Anatomic variants: None significant IMPRESSION: 1.  Restricted diffusion in the right MCA territory, with associated increased T2 signal, which correlates with the findings seen on the 12/06/2020 CT head and is consistent with subacute infarct. 2. No intracranial large vessel occlusion. Multifocal narrowing in the intracranial ICAs, likely secondary to atherosclerotic plaque. In addition, there is possible focal stenosis proximally of a right MCA branch, which appears to supply the right anterior frontal lobe in the area of infarction. Electronically Signed   By: Merilyn Baba M.D.   On: 12/07/2020 12:44   VAS US CAROTID Result Date: 12/07/2020 Carotid Arterial Duplex Study Patient Name:  JUDIETH MCKOWN  Date of Exam:   12/07/2020 Medical Rec #: [id number]       Accession #:    [id number] Date of Birth: 11-01-1940        Patient Gender: F Patient Age:   77 years Exam Location:  [facility] Procedure:      VAS US CAROTID Referring Phys: Benjamine Mola MATHEWS --------------------------------------------------------------------------------  Indications:       CVA. Risk Factors:      Hypertension, hyperlipidemia, Diabetes. Comparison Study:  no prior Performing Technologist: Archie Patten RVS  Examination Guidelines: A complete evaluation includes B-mode imaging, spectral Doppler, color Doppler, and power Doppler as needed of all accessible portions of each vessel. Bilateral testing is considered an integral part of a complete examination. Limited examinations for reoccurring indications may be performed as noted.  Summary: Right Carotid: Velocities in the right ICA are consistent with a 1-39% stenosis. Left Carotid: Velocities in the left ICA are consistent with a 1-39% stenosis. Vertebrals: Bilateral vertebral arteries demonstrate antegrade flow. *See table(s) above for measurements and observations.  Electronically signed by Antony Contras MD on 12/07/2020 at 5:04:27 PM.    Final     12-lead ECG SR All prior EKG's in EPIC reviewed with no documented atrial  fibrillation  Telemetry SR, she has had fleeting PATs (note that her full hospitalization telemetry is not available for review  Assessment and Plan:  1. Cryptogenic stroke The patient presents with cryptogenic stroke.  I spoke with the patient )via her daughter Burman Freestone on speaker phone who translates) regarding rational for heart rhythm monitoring in setting of a cryptogenic stroke, I discussed event monitor and loop implant. Given her mod-severe pulmonary HTN and some brief PATs on telemetry I think the likely hood of finding AFib is high and recommend  we 1st pursue 30 day monitor.  The patient and her daughter are agreeable to that strategy and I have ordered the monitor for her and will make EP follow up  I discussed with Dr. Rayann Heman, agrees and recommend a 30 day monitor, this will be mailed to the home, I have discussed this with the patient and daughter.    Ren Aspinall Dyane Dustman, PA-C 12/08/2020

## 2020-12-09 NOTE — Discharge Summary (Addendum)
[facility]  Physician Discharge Summary   Patient ID: Kaisa Wofford MRN: [id number] DOB/AGE: [birth year] 80 y.o.  Admit date: 11/23/2020 Discharge date: 12/08/2020    PCP: [name], MD  DISCHARGE DIAGNOSES:  Acute on chronic respiratory failure with hypoxia and hypercapnia Recurrent pleural effusion Subacute stroke Acute on chronic diastolic CHF Moderate pulmonary hypertension Essential hypertension Anemia of chronic disease Diabetes mellitus type 2, controlled Hyperthyroidism Peripheral neuropathy   RECOMMENDATIONS FOR OUTPATIENT FOLLOW UP: Home health has been ordered Patient has an appointment with pulmonology in the next few weeks Noninvasive ventilator has been ordered, waiting on insurance authorization Patient has oxygen at home   Home Health: PT and OT Equipment/Devices: Wheelchair  CODE STATUS: Full code  DISCHARGE CONDITION: fair  Diet recommendation: Modified carbohydrate/heart healthy  INITIAL HISTORY: 80 year old female with past medical history significant for chronic diastolic congestive heart failure, essential hypertension, hyperlipidemia, type 2 diabetes mellitus, hypothyroidism, 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  Patient with history of pleural effusion with recent hospitalization and underwent thoracentesis at that time.  Patient was evaluated by cardiothoracic surgery, Dr. Kipp Brood and x-ray notable for enlarging right-sided pleural effusion and was referred to pulmonology, Dr. Valeta Harms for thoracentesis.  Due to her continued progressing shortness of breath, patient presented to ED for further evaluation.  Patient typically utilizes 2 L nasal cannula at baseline but had increased to 4 L morning of hospital presentation. In the ED, patient was found to have acute on chronic respiratory failure with hypercapnia and hypoxia.  Patient was placed on BiPAP.  She  was hospitalized for further management.  She was seen by cardiology and pulmonology.     Consultants: Pulmonology.  Cardiology.  Neurology.   Procedures:     Right thoracentesis 11/25/2020 Right and left heart cath 11/30/2020    HOSPITAL COURSE:   Acute on chronic hypoxic and hypercapnic respiratory failure Usually uses 2 L of oxygen by nasal cannula.  Had increased oxygen requirements during the early part of this admission.  Also required BiPAP. Currently on 2 L of oxygen saturating in the mid to late 90s. Has not been able to tolerate the BiPAP.  Respiratory status has been stable just on oxygen. Previous rounding MD initiated the process for a noninvasive ventilator.  Please see the notes from 12/4. CT chest showed no evidence for PE. Respiratory status is stable.   Recurrent pleural effusion Patient was seen by pulmonology and underwent thoracentesis on 11/211/23.  CT scan did show a moderate right-sided pleural effusion.  Pulmonology has signed off. She is status post resection of anterior mediastinal mass in September 2022.  Pleural fluid cultures without any growth.  Thought to be a transudative process. Cytology showed atypical mesothelial cells. Will need outpatient follow-up with pulmonology.  She has an appointment for same. Chest x-ray on 12/3 showed stable findings with small pleural effusions.   Subacute stroke Patient complained of dizziness on 12/4.  A CT head was done which raised concern for a subacute infarct in the right MCA territory.  Transferred to [facility].  Seen by neurology.  MRI did confirm subacute stroke.   LDL noted to be 107.  Patient on simvastatin. HbA1c 5.7. Recent echocardiogram from 11/22 showed normal systolic function.  No significant valvular disease was noted. Patient was seen by neurology who recommended aspirin and Plavix for 3 weeks followed by Plavix alone.  Loop recorder was recommended.  Seen by  electrophysiology who arrange  for 30-day monitor to begin with.   Acute on chronic diastolic CHF/moderate pulmonary hypertension Normal systolic function noted on recent echocardiogram.  Patient with severe pulmonary hypertension. Underwent left and right heart catheterization on 11/29.  Did not show any significant CAD.  Moderate pulmonary hypertension was noted.  Medical management was recommended. Patient remains on aspirin, atenolol, hydralazine.  Continue furosemide. Volume status is stable.    Acute kidney injury/hyponatremia/hypokalemia Electrolytes are stable.  Renal function is stable.   Essential hypertension Stable.  Continue current medications.  Anemia of chronic disease Stable.  No evidence of overt blood loss.  Diabetes mellitus type 2 Diet controlled.  HbA1c 5.7.  Not on any medications for same.  History of hyperthyroidism On methimazole.  History of neuropathy On gabapentin.   History of anxiety On diazepam.  Hyperlipidemia On statin.  Goals of care Seen by palliative care.  Remains full code at this time.   Obesity Estimated body mass index is 34.82 kg/m as calculated from the following:   Height as of this encounter: 4\' 11"  (1.499 m).   Weight as of this encounter: 78.2 kg.  Patient stable.  Okay for discharge home today.  Discussed with her daughter in detail.   PERTINENT LABS:  The results of significant diagnostics from this hospitalization (including imaging, microbiology, ancillary and laboratory) are listed below for reference.     Labs:  COVID-19 Labs   Lab Results  Component Value Date   SARSCOV2NAA NEGATIVE 11/23/2020   [place] NEGATIVE 11/03/2020   [place] NEGATIVE 10/01/2020   [place] NEGATIVE 09/24/2020      Basic Metabolic Panel: Recent Labs  Lab 12/03/20 0356 12/04/20 0434 12/06/20 0411 12/07/20 0144 12/08/20 0147  NA 127* 128* 131* 130* 130*  K 3.6 4.6 3.4* 3.2* 4.7  CL 82* 86* 89* 86* 88*  CO2 40* 33* 38* 37* 32  GLUCOSE  119* 115* 109* 98 92  BUN 26* 23 17 16 18   CREATININE 1.53* 1.22* 1.29* 1.33* 1.36*  CALCIUM 8.2* 8.6* 8.5* 8.5* 8.7*   Liver Function Tests: Recent Labs  Lab 12/03/20 0356 12/04/20 0434 12/06/20 0411 12/07/20 0144  AST 15 20 14* 13*  ALT 8 8 8 7   ALKPHOS 27* 31* 30* 30*  BILITOT 0.4 0.4 0.6 0.5  PROT 6.0* 6.3* 6.0* 5.6*  ALBUMIN 3.2* 3.4* 3.3* 3.0*    CBC: Recent Labs  Lab 12/06/20 0411 12/07/20 0144 12/08/20 0147  WBC 5.0 5.2 5.6  HGB 8.7* 8.1* 7.9*  HCT 26.6* 24.8* 24.7*  MCV 80.6 80.5 81.8  PLT 339 341 334    BNP: BNP (last 3 results) Recent Labs    10/01/20 1514 11/03/20 1241 11/23/20 1145  BNP 111.1* 163.4* 414.8*     IMAGING STUDIES DG Chest 1 View  Result Date: 12/05/2020 CLINICAL DATA:  Hypoxia EXAM: CHEST  1 VIEW COMPARISON:  Chest x-rays dated 12/02/2020 and 11/30/2020. FINDINGS: Overall no significant interval change. Stable cardiomegaly. Mild central pulmonary vascular congestion. Veiled bibasilar opacities, likely small pleural effusions and/or atelectasis. IMPRESSION: 1. Stable cardiomegaly with central pulmonary vascular congestion suggesting mild CHF/volume overload. 2. Probable small bilateral pleural effusions and/or atelectasis. Electronically Signed   By: Franki Cabot M.D.   On: 12/05/2020 16:32   DG Chest 2 View  Result Date: 11/20/2020 CLINICAL DATA:  Mediastinal mass. EXAM: CHEST - 2 VIEW COMPARISON:  November 05, 2020.  October 02, 2020. FINDINGS: Stable cardiomegaly. No pneumothorax is noted. Mild right pleural effusion is noted with associated  right lower lobe atelectasis or pneumonia. Minimal left pleural effusion is noted with left basilar atelectasis or infiltrate. Bony thorax is unremarkable. IMPRESSION: Mild right pleural effusion is noted with associated right lower lobe atelectasis or pneumonia. Minimal left pleural effusion is noted with left basilar atelectasis or infiltrate. Electronically Signed   By: Marijo Conception M.D.    On: 11/20/2020 11:06   CT HEAD WO CONTRAST (5MM)  Result Date: 12/06/2020 CLINICAL DATA:  80 year old female with dizziness. EXAM: CT HEAD WITHOUT CONTRAST TECHNIQUE: Contiguous axial images were obtained from the base of the skull through the vertex without intravenous contrast. COMPARISON:  Head CT 09/24/2020. FINDINGS: Brain: Patchy and confluent bilateral cerebral white matter hypodensity compatible with chronic small vessel disease. But in the anterior right MCA territory there is new cytotoxic edema or developing encephalomalacia since September (series 2, image 24) in an area of about 18 mm. No associated hemorrhage or mass effect. No changes of recent cortically based infarct elsewhere. Stable gray-white matter differentiation elsewhere. No superimposed shift, ventriculomegaly, mass effect, evidence of mass lesion, intracranial hemorrhage. Vascular: Calcified atherosclerosis at the skull base. No suspicious intracranial vascular hyperdensity. Skull: No acute osseous abnormality identified. Sinuses/Orbits: Visualized paranasal sinuses and mastoids are stable and well aerated. Tympanic cavities are clear. Other: No acute orbit or scalp soft tissue finding. IMPRESSION: 1. 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 This is new since September. No associated hemorrhage or mass effect. 2. Otherwise stable CT appearance of chronic small vessel disease. These results will be called to the ordering clinician or representative by the Radiologist Assistant, and communication documented in the PACS or [employer]. Electronically Signed   By: Genevie Ann M.D.   On: 12/06/2020 11:54   CT Angio Chest PE W and/or Wo Contrast  Result Date: 11/23/2020 CLINICAL DATA:  Difficulty breathing. EXAM: CT ANGIOGRAPHY CHEST WITH CONTRAST TECHNIQUE: Multidetector CT imaging of the chest was performed using the standard protocol during bolus administration of intravenous contrast. Multiplanar CT  image reconstructions and MIPs were obtained to evaluate the vascular anatomy. CONTRAST:  51mL OMNIPAQUE IOHEXOL 350 MG/ML SOLN COMPARISON:  October 02, 2020. FINDINGS: Cardiovascular: Satisfactory opacification of the pulmonary arteries to the segmental level. No evidence of pulmonary embolism. Moderate cardiomegaly is noted. 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 No pericardial effusion. Atherosclerosis of thoracic aorta is noted without dissection. Mediastinum/Nodes: No enlarged mediastinal, hilar, or axillary lymph nodes. Thyroid gland, trachea, and esophagus demonstrate no significant findings. Lungs/Pleura: No pneumothorax is noted. Moderate right pleural effusion is noted with associated atelectasis of the right lower lobe. Mild subsegmental atelectasis is seen involving the right middle lobe and lingular segment of the left upper lobe. Upper Abdomen: No acute abnormality. Musculoskeletal: No chest wall abnormality. No acute or significant osseous findings. Review of the MIP images confirms the above findings. IMPRESSION: No definite evidence of pulmonary embolus. Moderate size right pleural effusion is noted with associated atelectasis of the right lower lobe. Mild subsegmental atelectasis is also noted in the right middle lobe and lingular segment of left upper lobe. 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 Aortic Atherosclerosis (ICD10-I70.0). Electronically Signed   By: Marijo Conception M.D.   On: 11/23/2020 13:47   MR ANGIO HEAD WO CONTRAST  Result Date: 12/07/2020 CLINICAL DATA:  Stroke, follow-up EXAM: MRI HEAD WITHOUT CONTRAST MRA HEAD WITHOUT CONTRAST TECHNIQUE: Multiplanar, multi-echo pulse sequences of the brain and surrounding structures were acquired without intravenous  contrast. Angiographic images of the Circle of Willis were acquired using MRA technique without intravenous contrast. COMPARISON:  CT head  12/06/2020, correlation is also made with MRI head 06/02/2005 FINDINGS: MRI HEAD FINDINGS Brain: Wedge-shaped area of restricted diffusion with ADC correlate in the right frontal lobe in the right MCA territory (series 5, images 19-24), consistent with the findings seen on the 12/06/2020 CT head. This area is associated with increased T2 signal. No acute hemorrhage, mass, mass effect, or midline shift. No hydrocephalus or extra-axial collection. T2 hyperintense signal in the periventricular white matter and pons, likely the sequela of moderate chronic small vessel ischemic disease. Vascular: Please see MRA findings below. Skull and upper cervical spine: Normal marrow signal. Degenerative changes in the cervical spine, with reversal of the normal cervical lordosis. Sinuses/Orbits: No acute or significant finding. Status post right lens replacement. Other: Trace fluid in right mastoid air cells. MRA HEAD FINDINGS Anterior circulation: Both internal carotid arteries are patent to the termini, with mild irregularity in right greater than left cavernous ICA and moderate narrowing of the right greater than left supraclinoid ICA, likely secondary to atherosclerotic plaque. A1 segments patent, although there may be focal narrowing at the origin of the right greater than A1. Normal anterior communicating artery. Anterior cerebral arteries are patent to their distal aspects. No M1 stenosis or occlusion. Normal MCA bifurcations. Narrowing of a proximal right M2 branch (series 4, image 79). Distal MCA branches perfused and symmetric. There is possible focal stenosis proximally of a small right MCA branch (series 4, image 84), which appears to supply the right frontal lobe area described above as having restricted diffusion. Posterior circulation: Vertebral arteries patent to the vertebrobasilar junction without stenosis. Posterior inferior cerebral arteries visualized on the left. Basilar patent to its distal aspect. Superior  cerebellar arteries patent bilaterally. Diminutive right P1, with near fetal origin of the right PCA. PCAs perfused to their distal aspects without stenosis. The bilateral posterior communicating arteries are visualized. Anatomic variants: None significant IMPRESSION: 1. Restricted diffusion in the right MCA territory, with associated increased T2 signal, which correlates with the findings seen on the 12/06/2020 CT head and is consistent with subacute infarct. 2. No intracranial large vessel occlusion. Multifocal narrowing in the intracranial ICAs, likely secondary to atherosclerotic plaque. In addition, there is possible focal stenosis proximally of a right MCA branch, which appears to supply the right anterior frontal lobe in the area of infarction. Electronically Signed   By: Merilyn Baba M.D.   On: 12/07/2020 12:44   MR BRAIN WO CONTRAST  Result Date: 12/07/2020 CLINICAL DATA:  Stroke, follow-up EXAM: MRI HEAD WITHOUT CONTRAST MRA HEAD WITHOUT CONTRAST TECHNIQUE: Multiplanar, multi-echo pulse sequences of the brain and surrounding structures were acquired without intravenous contrast. Angiographic images of the Circle of Willis were acquired using MRA technique without intravenous contrast. COMPARISON:  CT head 12/06/2020, correlation is also made with MRI head 06/02/2005 FINDINGS: MRI HEAD FINDINGS Brain: Wedge-shaped area of restricted diffusion with ADC correlate in the right frontal lobe in the right MCA territory (series 5, images 19-24), consistent with the findings seen on the 12/06/2020 CT head. This area is associated with increased T2 signal. No acute hemorrhage, mass, mass effect, or midline shift. No hydrocephalus or extra-axial collection. T2 hyperintense signal in the periventricular white matter and pons, likely the sequela of moderate chronic small vessel ischemic disease. Vascular: Please see MRA findings below. Skull and upper cervical spine: Normal marrow signal. Degenerative changes in  the cervical spine,  with reversal of the normal cervical lordosis. Sinuses/Orbits: No acute or significant finding. Status post right lens replacement. Other: Trace fluid in right mastoid air cells. MRA HEAD FINDINGS Anterior circulation: Both internal carotid arteries are patent to the termini, with mild irregularity in right greater than left cavernous ICA and moderate narrowing of the right greater than left supraclinoid ICA, likely secondary to atherosclerotic plaque. A1 segments patent, although there may be focal narrowing at the origin of the right greater than A1. Normal anterior communicating artery. Anterior cerebral arteries are patent to their distal aspects. No M1 stenosis or occlusion. Normal MCA bifurcations. Narrowing of a proximal right M2 branch (series 4, image 79). Distal MCA branches perfused and symmetric. There is possible focal stenosis proximally of a small right MCA branch (series 4, image 84), which appears to supply the right frontal lobe area described above as having restricted diffusion. Posterior circulation: Vertebral arteries patent to the vertebrobasilar junction without stenosis. Posterior inferior cerebral arteries visualized on the left. Basilar patent to its distal aspect. Superior cerebellar arteries patent bilaterally. Diminutive right P1, with near fetal origin of the right PCA. PCAs perfused to their distal aspects without stenosis. The bilateral posterior communicating arteries are visualized. Anatomic variants: None significant IMPRESSION: 1. Restricted diffusion in the right MCA territory, with associated increased T2 signal, which correlates with the findings seen on the 12/06/2020 CT head and is consistent with subacute infarct. 2. No intracranial large vessel occlusion. Multifocal narrowing in the intracranial ICAs, likely secondary to atherosclerotic plaque. In addition, there is possible focal stenosis proximally of a right MCA branch, which appears to supply the  right anterior frontal lobe in the area of infarction. Electronically Signed   By: Merilyn Baba M.D.   On: 12/07/2020 12:44   [place] CHEST (PLEURAL EFFUSION)  Result Date: 11/24/2020 CLINICAL DATA:  Right pleural effusion, assess volume for thoracentesis EXAM: CHEST ULTRASOUND COMPARISON:  11/23/2020 FINDINGS: Ultrasound performed of the right posterior chest with the patient upright. Small non loculated right pleural effusion noted, not enough to warrant therapeutic thoracentesis. Procedure not performed. IMPRESSION: Small right effusion by ultrasound. Electronically Signed   By: Jerilynn Mages.  Shick M.D.   On: 11/24/2020 14:58   CARDIAC CATHETERIZATION  Result Date: 11/30/2020   Mid LAD lesion is 30% stenosed.   Dist LAD lesion is 30% stenosed.   LV end diastolic pressure is low.   LV end diastolic pressure is normal.   The left ventricular ejection fraction is 55-65% by visual estimate.   Hemodynamic findings consistent with moderate pulmonary hypertension. 1.  Mild obstructive coronary artery disease. 2.  Preserved cardiac output of 6.1 L/min and an index of 3.55 L/min/m. 3.  Mean pulmonary artery pressure of 39 mmHg with pulmonary vascular resistance of 3.44 Woods units; transpulmonary gradient of 17mmHg (mean wedge pressure 94mmHg) 4.  LVEDP of 9 mmHg with preserved LVEF. Recommendation: Medical therapy.   DG CHEST PORT 1 VIEW  Result Date: 12/02/2020 CLINICAL DATA:  Shortness of breath EXAM: PORTABLE CHEST 1 VIEW COMPARISON:  Chest radiograph 11/30/2020 FINDINGS: The heart is enlarged, unchanged. The mediastinal contours are stable. There are bilateral pleural effusions with bibasilar airspace disease, overall slightly worsened since 11/30/2020. The upper lungs remain aerated. There is no pneumothorax. The bones are stable. IMPRESSION: Bilateral pleural effusions with adjacent airspace disease/consolidation, worsened since 11/30/2020. Electronically Signed   By: Valetta Mole M.D.   On: 12/02/2020 08:52    DG CHEST PORT 1 VIEW  Result Date: 11/30/2020 CLINICAL DATA:  Bilateral pleural effusions and bibasilar atelectasis. EXAM: PORTABLE CHEST 1 VIEW COMPARISON:  11/28/2020 and CT chest 11/23/2020. FINDINGS: Trachea is midline. Heart is enlarged. Thoracic aorta is calcified. Bibasilar collapse/consolidation with bilateral pleural effusions. Left perihilar airspace consolidation. Scattered linear subsegmental atelectasis. Degenerative changes in the shoulders, left greater than right. IMPRESSION: 1. Bibasilar collapse/consolidation. Difficult to exclude pneumonia. 2. Bilateral pleural effusions, similar. 3. Left perihilar airspace consolidation may be infectious/inflammatory. Recommend attention on follow-up. 4. Scattered subsegmental atelectasis. Electronically Signed   By: Lorin Picket M.D.   On: 11/30/2020 08:11   DG CHEST PORT 1 VIEW  Result Date: 11/28/2020 CLINICAL DATA:  Shortness of breath. 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 EXAM: PORTABLE CHEST 1 VIEW COMPARISON:  Chest x-ray 11/25/2020.  Chest CT 11/23/2020. FINDINGS: Cardiomediastinal silhouette appears enlarged unchanged. There are small bilateral pleural effusions, right greater than left. These are unchanged from prior. There is central pulmonary vascular congestion and bibasilar opacities. No pneumothorax. No acute fractures. IMPRESSION: 1. Cardiomegaly with central pulmonary vascular congestion 2. Stable bilateral pleural effusions and bibasilar atelectasis/airspace disease. Electronically Signed   By: Ronney Asters M.D.   On: 11/28/2020 16:22   DG CHEST PORT 1 VIEW  Result Date: 11/25/2020 CLINICAL DATA:  Thoracentesis EXAM: PORTABLE CHEST 1 VIEW COMPARISON:  11/23/2020 FINDINGS: Single frontal view of the chest demonstrates persistent enlargement of the cardiac silhouette. There are bilateral pleural effusions, decreased on the right after thoracentesis. No evidence of pneumothorax. No acute bony abnormalities.  IMPRESSION: 1. Decreased right pleural effusion after thoracentesis. No evidence of complication. 2. Stable left pleural effusion. Electronically Signed   By: Randa Ngo M.D.   On: 11/25/2020 19:44   DG Chest Port 1 View  Result Date: 11/23/2020 CLINICAL DATA:  Shortness of breath, difficulty breathing EXAM: PORTABLE CHEST 1 VIEW COMPARISON:  11/20/2020 FINDINGS: Cardiomegaly. Diffuse bilateral airspace disease, worsening since prior study. Bilateral pleural effusions, right larger than left also slightly worsening since prior study. Elevation of the right diaphragm. No acute bony abnormality. IMPRESSION: Worsening bilateral airspace disease and effusions, favor edema/CHF. Electronically Signed   By: Rolm Baptise M.D.   On: 11/23/2020 11:47   VAS US CAROTID  Result Date: 12/07/2020 Carotid Arterial Duplex Study Patient Name:  OREE MIRELEZ  Date of Exam:   12/07/2020 Medical Rec #: [id number]       Accession #:    [id number] Date of Birth: 10-25-1940        Patient Gender: F Patient Age:   70 years Exam Location:  [facility] Procedure:      VAS US CAROTID Referring Phys: Benjamine Mola MATHEWS --------------------------------------------------------------------------------  Indications:       CVA. Risk Factors:      Hypertension, hyperlipidemia, Diabetes. Comparison Study:  no prior Performing Technologist: Archie Patten RVS  Examination Guidelines: A complete evaluation includes B-mode imaging, spectral Doppler, color Doppler, and power Doppler as needed of all accessible portions of each vessel. Bilateral testing is considered an integral part of a complete examination. Limited examinations for reoccurring indications may be performed as noted.  Right Carotid Findings: +----------+--------+--------+--------+------------------+--------+           PSV cm/sEDV cm/sStenosisPlaque DescriptionComments +----------+--------+--------+--------+------------------+--------+ CCA Prox  62      13               heterogenous               +----------+--------+--------+--------+------------------+--------+ CCA Distal78      16  heterogenous               +----------+--------+--------+--------+------------------+--------+ ICA Prox  66      12      1-39%   heterogenous               +----------+--------+--------+--------+------------------+--------+ ICA Distal75      25                                         +----------+--------+--------+--------+------------------+--------+ ECA       104                                                +----------+--------+--------+--------+------------------+--------+ +----------+--------+-------+--------+-------------------+           PSV cm/sEDV cmsDescribeArm Pressure (mmHG) +----------+--------+-------+--------+-------------------+ [id number]                                        +----------+--------+-------+--------+-------------------+ +---------+--------+--+--------+--+---------+ VertebralPSV cm/s79EDV cm/s24Antegrade +---------+--------+--+--------+--+---------+  Left Carotid Findings: +----------+--------+--------+--------+------------------+--------+           PSV cm/sEDV cm/sStenosisPlaque DescriptionComments +----------+--------+--------+--------+------------------+--------+ CCA Prox  93      17              heterogenous               +----------+--------+--------+--------+------------------+--------+ CCA Distal106     32              heterogenous               +----------+--------+--------+--------+------------------+--------+ ICA Prox  116     28      1-39%   heterogenous               +----------+--------+--------+--------+------------------+--------+ ICA Distal82      30                                         +----------+--------+--------+--------+------------------+--------+ ECA       114                                                 +----------+--------+--------+--------+------------------+--------+ +----------+--------+--------+--------+-------------------+           PSV cm/sEDV cm/sDescribeArm Pressure (mmHG) +----------+--------+--------+--------+-------------------+ [id number]                                         +----------+--------+--------+--------+-------------------+ +---------+--------+--+--------+--+---------+ VertebralPSV cm/s87EDV cm/s26Antegrade +---------+--------+--+--------+--+---------+   Summary: Right Carotid: Velocities in the right ICA are consistent with a 1-39% stenosis. Left Carotid: Velocities in the left ICA are consistent with a 1-39% stenosis. Vertebrals: Bilateral vertebral arteries demonstrate antegrade flow. *See table(s) above for measurements and observations.  Electronically signed by Antony Contras MD on 12/07/2020 at 5:04:27 PM.    Final    ECHOCARDIOGRAM LIMITED  Result Date: 11/24/2020    ECHOCARDIOGRAM LIMITED REPORT   Patient Name:   TAYTE CHILDERS Date of  Exam: 11/24/2020 Medical Rec #:  [id number]      Height:       59.0 in Accession #:    [id number]     Weight:       168.9 lb Date of Birth:  February 10, 1940       BSA:          1.717 m Patient Age:    34 years       BP:           171/57 mmHg Patient Gender: F              HR:           70 bpm. Exam Location:  Inpatient Procedure: 3D Echo, Limited Echo, Cardiac Doppler and Color Doppler Indications:    I50.40* Unspecified combined systolic (congestive) and diastolic                 (congestive) heart failure  History:        Patient has prior history of Echocardiogram examinations, most                 recent 11/04/2020. Signs/Symptoms:Shortness of Breath and                 Dyspnea; Risk Factors:Diabetes and Dyslipidemia.  Sonographer:    Roseanna Rainbow RDCS Referring Phys: [id number] [place]  1. Left ventricular ejection fraction, by estimation, is 55 to 60%. The left ventricle has normal function. There is mild left  ventricular hypertrophy.  2. Right ventricular systolic function is mildly reduced. The right ventricular size is normal. There is severely elevated pulmonary artery systolic pressure. The estimated right ventricular systolic pressure is 02.6 mmHg.  3. Left atrial size was mild to moderately dilated.  4. Right atrial size was mildly dilated.  5. The mitral valve is normal in structure. Mild to moderate mitral valve regurgitation. No evidence of mitral stenosis.  6. The aortic valve is tricuspid. Aortic valve regurgitation is not visualized. No aortic stenosis is present.  7. The inferior vena cava is dilated in size with <50% respiratory variability, suggesting right atrial pressure of 15 mmHg.  8. Left pleural effusion noted. FINDINGS  Left Ventricle: Left ventricular ejection fraction, by estimation, is 55 to 60%. The left ventricle has normal function. The left ventricular internal cavity size was normal in size. There is mild left ventricular hypertrophy. Right Ventricle: The right ventricular size is normal. Right ventricular systolic function is mildly reduced. There is severely elevated pulmonary artery systolic pressure. The tricuspid regurgitant velocity is 4.13 m/s, and with an assumed right atrial pressure of 15 mmHg, the estimated right ventricular systolic pressure is 37.8 mmHg. Left Atrium: Left atrial size was mild to moderately dilated. Right Atrium: Right atrial size was mildly dilated. Pericardium: Left pleural effusion noted. Trivial pericardial effusion is present. Mitral Valve: The mitral valve is normal in structure. Mild to moderate mitral valve regurgitation. No evidence of mitral valve stenosis. Tricuspid Valve: Tricuspid valve regurgitation is mild. Aortic Valve: The aortic valve is tricuspid. Aortic valve regurgitation is not visualized. No aortic stenosis is present. Aorta: The aortic root is normal in size and structure. Venous: The inferior vena cava is dilated in size with less than  50% respiratory variability, suggesting right atrial pressure of 15 mmHg. IAS/Shunts: No atrial level shunt detected by color flow Doppler. LEFT VENTRICLE PLAX 2D LVIDd:         4.50 cm LVIDs:  2.80 cm LV PW:         1.10 cm LV IVS:        1.20 cm LVOT diam:     2.20 cm     3D Volume EF: LVOT Area:     3.80 cm    3D EF:        67 %                            LV EDV:       90 ml                            LV ESV:       30 ml LV Volumes (MOD)           LV SV:        60 ml LV vol d, MOD A2C: 68.0 ml LV vol d, MOD A4C: 78.4 ml LV vol s, MOD A2C: 29.3 ml LV vol s, MOD A4C: 30.4 ml LV SV MOD A2C:     38.7 ml LV SV MOD A4C:     78.4 ml LV SV MOD BP:      46.0 ml IVC IVC diam: 2.40 cm LEFT ATRIUM         Index LA diam:    3.60 cm 2.10 cm/m   AORTA Ao Root diam: 3.20 cm Ao Asc diam:  3.50 cm MITRAL VALVE                  TRICUSPID VALVE MV Area (PHT): 3.17 cm       TR Peak grad:   68.2 mmHg MV Decel Time: 239 msec       TR Vmax:        413.00 cm/s MR Peak grad:    110.7 mmHg MR Mean grad:    80.0 mmHg    SHUNTS MR Vmax:         526.00 cm/s  Systemic Diam: 2.20 cm MR Vmean:        427.0 cm/s MR PISA:         1.01 cm MR PISA Eff ROA: 7 mm MR PISA Radius:  0.40 cm MV E velocity: 117.00 cm/s MV A velocity: 95.50 cm/s MV E/A ratio:  1.23 Dalton McleanMD Electronically signed by Franki Monte Signature Date/Time: 11/24/2020/5:48:09 PM    Final     DISCHARGE EXAMINATION: Vitals:   12/08/20 0200 12/08/20 0428 12/08/20 0849 12/08/20 1321  BP: (!) 157/67  (!) 127/49 (!) 138/52  Pulse: 72  64 68  Resp:   18 18  Temp: 97.9 F (36.6 C)  98 F (36.7 C) 99.1 F (37.3 C)  TempSrc: Oral  Oral Oral  SpO2: 97%  100% 100%  Weight:  78.9 kg    Height:       General appearance: Awake alert.  In no distress Resp: Normal effort at rest.  Diminished air entry at the bases.  No wheezing or rhonchi. Cardio: S1-S2 is normal regular.  No S3-S4.  No rubs murmurs or bruit GI: Abdomen is soft.  Nontender nondistended.   Bowel sounds are present normal.  No masses organomegaly   DISPOSITION: Home  Discharge Instructions     Call MD for:  difficulty breathing, headache or visual disturbances   Complete by: As directed    Call MD for:  extreme fatigue   Complete by: As directed  Call MD for:  persistant dizziness or light-headedness   Complete by: As directed    Call MD for:  persistant nausea and vomiting   Complete by: As directed    Call MD for:  severe uncontrolled pain   Complete by: As directed    Call MD for:  temperature >100.4   Complete by: As directed    Diet - low sodium heart healthy   Complete by: As directed    Discharge instructions   Complete by: As directed    Take your medications as prescribed.  Please be sure to follow-up with your primary care provider in 1 week.  Home health has been ordered.  An appointment has been made with the lung doctor as well.  You were cared for by a hospitalist during your hospital stay. If you have any questions about your discharge medications or the care you received while you were in the hospital after you are discharged, you can call the unit and asked to speak with the hospitalist on call if the hospitalist that took care of you is not available. Once you are discharged, your primary care physician will handle any further medical issues. Please note that NO REFILLS for any discharge medications will be authorized once you are discharged, as it is imperative that you return to your primary care physician (or establish a relationship with a primary care physician if you do not have one) for your aftercare needs so that they can reassess your need for medications and monitor your lab values. If you do not have a primary care physician, you can call [phone] for a physician referral.   Increase activity slowly   Complete by: As directed           Allergies as of 12/08/2020   No Known Allergies      Medication List     STOP taking these  medications    traMADol 50 MG tablet Commonly known as: ULTRAM       TAKE these medications    acetaminophen 500 MG tablet Commonly known as: TYLENOL Take 1,000 mg by mouth every 6 (six) hours as needed for fever or headache (pain). What changed: Another medication with the same name was removed. Continue taking this medication, and follow the directions you see here.   albuterol (2.5 MG/3ML) 0.083% nebulizer solution Commonly known as: PROVENTIL Take 2.5 mg by nebulization every 6 (six) hours as needed for wheezing or shortness of breath.   albuterol 108 (90 Base) MCG/ACT inhaler Commonly known as: VENTOLIN HFA Inhale 2 puffs into the lungs every 6 (six) hours as needed for wheezing or shortness of breath.   alendronate 70 MG tablet Commonly known as: FOSAMAX Take 70 mg by mouth every Tuesday. Take with a full glass of water on an empty stomach.   aspirin 81 MG EC tablet Take 1 tablet (81 mg total) by mouth daily for 21 days. Swallow whole.   atenolol 50 MG tablet Commonly known as: TENORMIN Take 50 mg by mouth 2 (two) times daily.   calcium carbonate 1500 (600 Ca) MG Tabs tablet Commonly known as: OSCAL Take 600 mg of elemental calcium by mouth 2 (two) times daily with a meal.   clopidogrel 75 MG tablet Commonly known as: PLAVIX Take 1 tablet (75 mg total) by mouth daily.   diazepam 2 MG tablet Commonly known as: VALIUM Take 2 mg by mouth every 12 (twelve) hours as needed for anxiety.   diclofenac sodium 1 %  Gel Commonly known as: VOLTAREN Apply 4 g topically 4 (four) times daily as needed for pain. shoulder   furosemide 20 MG tablet Commonly known as: LASIX Take 3 tablets (60 mg total) by mouth daily. What changed:  medication strength how much to take when to take this   gabapentin 100 MG capsule Commonly known as: NEURONTIN Take 100 mg by mouth at bedtime.   guaiFENesin 600 MG 12 hr tablet Commonly known as: MUCINEX Take 600 mg by mouth 2 (two)  times daily as needed for cough.   hydrALAZINE 100 MG tablet Commonly known as: APRESOLINE Take 0.5 tablets (50 mg total) by mouth 3 (three) times daily.   melatonin 5 MG Tabs Take 10 mg by mouth at bedtime. What changed: Another medication with the same name was removed. Continue taking this medication, and follow the directions you see here.   methimazole 5 MG tablet Commonly known as: TAPAZOLE Take 0.5 tablets (2.5 mg total) by mouth daily.   multivitamin with minerals Tabs tablet Take 1 tablet by mouth daily.   OXYGEN Inhale 2-4 L into the lungs continuous.   pantoprazole 40 MG tablet Commonly known as: PROTONIX Take 1 tablet (40 mg total) by mouth daily at 6 (six) AM. What changed: when to take this   potassium chloride SA 20 MEQ tablet Commonly known as: KLOR-CON M Take 1 tablet (20 mEq total) by mouth daily.   simvastatin 40 MG tablet Commonly known as: ZOCOR Take 1 tablet (40 mg total) by mouth at bedtime. What changed:  medication strength how much to take   THERAFLU FLU/COLD/COUGH PO Take 30 mLs by mouth 2 (two) times daily as needed (cough/congestion).   vitamin B-12 1000 MCG tablet Commonly known as: CYANOCOBALAMIN Take 1,000 mcg by mouth daily.   vitamin C 500 MG tablet Commonly known as: ASCORBIC ACID Take 500 mg by mouth daily.   Vitamin D (Cholecalciferol) 25 MCG (1000 UT) Tabs Take 1,000 Units by mouth daily.   zinc gluconate 50 MG tablet Take 50 mg by mouth daily.          Follow-up Information     Lendon Colonel, NP. Go on 01/08/2021.   Specialties: Nurse Practitioner, Radiology, Cardiology Why: 01/08/21 at 2:20 PM Contact information: [place] [address] [place], [facility] Follow up.   Specialty: Home Health Services Why: The home health agency will contact you for the first home visit. Contact information: [address] [place]  [address] [phone]         [facility], [facility] Follow up.   Why: For DME and the NIV machine Contact information: [address] [place] [address] [phone]         [name], MD. Schedule an appointment as soon as possible for a visit in 1 week(s).   Specialty: Internal Medicine Contact information: [place] [address] [phone]         Garner Nash, DO Follow up on 12/31/2020.   Specialty: Pulmonary Disease Why: at 11AM. Contact information: [name] [address] [phone]                 TOTAL DISCHARGE TIME: 35-minute  [facility] Pager on [email or web address]  12/09/2020, 12:14 PM

## 2020-12-11 NOTE — Telephone Encounter (Signed)
Tried calling pt's daughter, NA and no option to leave msg, [facility]

## 2020-12-14 ENCOUNTER — Inpatient Hospital Stay (HOSPITAL_COMMUNITY): Payer: Medicare HMO

## 2020-12-14 ENCOUNTER — Inpatient Hospital Stay (HOSPITAL_COMMUNITY)
Admission: EM | Admit: 2020-12-14 | Discharge: 2020-12-21 | DRG: 291 | Disposition: A | Discharge status: Home Health | Payer: Medicare HMO | Attending: Internal Medicine | Admitting: Internal Medicine

## 2020-12-14 ENCOUNTER — Emergency Department (HOSPITAL_COMMUNITY): Payer: Medicare HMO

## 2020-12-14 DIAGNOSIS — J90 Pleural effusion, not elsewhere classified: Secondary | ICD-10-CM

## 2020-12-14 DIAGNOSIS — I248 Other forms of acute ischemic heart disease: Secondary | ICD-10-CM | POA: Diagnosis present

## 2020-12-14 DIAGNOSIS — Z7983 Long term (current) use of bisphosphonates: Secondary | ICD-10-CM

## 2020-12-14 DIAGNOSIS — D631 Anemia in chronic kidney disease: Secondary | ICD-10-CM | POA: Diagnosis present

## 2020-12-14 DIAGNOSIS — I6330 Cerebral infarction due to thrombosis of unspecified cerebral artery: Secondary | ICD-10-CM | POA: Diagnosis not present

## 2020-12-14 DIAGNOSIS — Z9889 Other specified postprocedural states: Secondary | ICD-10-CM | POA: Diagnosis not present

## 2020-12-14 DIAGNOSIS — K219 Gastro-esophageal reflux disease without esophagitis: Secondary | ICD-10-CM | POA: Diagnosis present

## 2020-12-14 DIAGNOSIS — J9621 Acute and chronic respiratory failure with hypoxia: Secondary | ICD-10-CM | POA: Diagnosis present

## 2020-12-14 DIAGNOSIS — E118 Type 2 diabetes mellitus with unspecified complications: Secondary | ICD-10-CM | POA: Diagnosis not present

## 2020-12-14 DIAGNOSIS — I700 Atherosclerosis of aorta: Secondary | ICD-10-CM | POA: Diagnosis not present

## 2020-12-14 DIAGNOSIS — Z8673 Personal history of transient ischemic attack (TIA), and cerebral infarction without residual deficits: Secondary | ICD-10-CM | POA: Diagnosis not present

## 2020-12-14 DIAGNOSIS — Z20822 Contact with and (suspected) exposure to covid-19: Secondary | ICD-10-CM | POA: Diagnosis present

## 2020-12-14 DIAGNOSIS — E662 Morbid (severe) obesity with alveolar hypoventilation: Secondary | ICD-10-CM | POA: Diagnosis present

## 2020-12-14 DIAGNOSIS — E1122 Type 2 diabetes mellitus with diabetic chronic kidney disease: Secondary | ICD-10-CM | POA: Diagnosis present

## 2020-12-14 DIAGNOSIS — I5033 Acute on chronic diastolic (congestive) heart failure: Secondary | ICD-10-CM

## 2020-12-14 DIAGNOSIS — I5043 Acute on chronic combined systolic (congestive) and diastolic (congestive) heart failure: Secondary | ICD-10-CM | POA: Diagnosis present

## 2020-12-14 DIAGNOSIS — L8990 Pressure ulcer of unspecified site, unspecified stage: Secondary | ICD-10-CM | POA: Insufficient documentation

## 2020-12-14 DIAGNOSIS — I10 Essential (primary) hypertension: Secondary | ICD-10-CM | POA: Diagnosis not present

## 2020-12-14 DIAGNOSIS — Z79899 Other long term (current) drug therapy: Secondary | ICD-10-CM

## 2020-12-14 DIAGNOSIS — E039 Hypothyroidism, unspecified: Secondary | ICD-10-CM | POA: Diagnosis present

## 2020-12-14 DIAGNOSIS — I2781 Cor pulmonale (chronic): Secondary | ICD-10-CM | POA: Diagnosis present

## 2020-12-14 DIAGNOSIS — J918 Pleural effusion in other conditions classified elsewhere: Secondary | ICD-10-CM | POA: Diagnosis present

## 2020-12-14 DIAGNOSIS — N179 Acute kidney failure, unspecified: Secondary | ICD-10-CM | POA: Diagnosis present

## 2020-12-14 DIAGNOSIS — E871 Hypo-osmolality and hyponatremia: Secondary | ICD-10-CM | POA: Diagnosis present

## 2020-12-14 DIAGNOSIS — E1140 Type 2 diabetes mellitus with diabetic neuropathy, unspecified: Secondary | ICD-10-CM | POA: Diagnosis present

## 2020-12-14 DIAGNOSIS — I2609 Other pulmonary embolism with acute cor pulmonale: Secondary | ICD-10-CM | POA: Diagnosis present

## 2020-12-14 DIAGNOSIS — E1169 Type 2 diabetes mellitus with other specified complication: Secondary | ICD-10-CM | POA: Diagnosis present

## 2020-12-14 DIAGNOSIS — J9622 Acute and chronic respiratory failure with hypercapnia: Secondary | ICD-10-CM | POA: Diagnosis present

## 2020-12-14 DIAGNOSIS — J9602 Acute respiratory failure with hypercapnia: Secondary | ICD-10-CM | POA: Diagnosis present

## 2020-12-14 DIAGNOSIS — L8991 Pressure ulcer of unspecified site, stage 1: Secondary | ICD-10-CM | POA: Diagnosis not present

## 2020-12-14 DIAGNOSIS — E119 Type 2 diabetes mellitus without complications: Secondary | ICD-10-CM

## 2020-12-14 DIAGNOSIS — M810 Age-related osteoporosis without current pathological fracture: Secondary | ICD-10-CM | POA: Diagnosis present

## 2020-12-14 DIAGNOSIS — Z6833 Body mass index (BMI) 33.0-33.9, adult: Secondary | ICD-10-CM | POA: Diagnosis not present

## 2020-12-14 DIAGNOSIS — E0590 Thyrotoxicosis, unspecified without thyrotoxic crisis or storm: Secondary | ICD-10-CM | POA: Diagnosis present

## 2020-12-14 DIAGNOSIS — Z9981 Dependence on supplemental oxygen: Secondary | ICD-10-CM

## 2020-12-14 DIAGNOSIS — Z7982 Long term (current) use of aspirin: Secondary | ICD-10-CM

## 2020-12-14 DIAGNOSIS — N1831 Chronic kidney disease, stage 3a: Secondary | ICD-10-CM | POA: Diagnosis present

## 2020-12-14 DIAGNOSIS — I130 Hypertensive heart and chronic kidney disease with heart failure and stage 1 through stage 4 chronic kidney disease, or unspecified chronic kidney disease: Secondary | ICD-10-CM | POA: Diagnosis present

## 2020-12-14 DIAGNOSIS — I509 Heart failure, unspecified: Secondary | ICD-10-CM

## 2020-12-14 DIAGNOSIS — I272 Pulmonary hypertension, unspecified: Secondary | ICD-10-CM | POA: Diagnosis present

## 2020-12-14 DIAGNOSIS — Z7902 Long term (current) use of antithrombotics/antiplatelets: Secondary | ICD-10-CM

## 2020-12-14 DIAGNOSIS — E785 Hyperlipidemia, unspecified: Secondary | ICD-10-CM | POA: Diagnosis present

## 2020-12-14 DIAGNOSIS — L89891 Pressure ulcer of other site, stage 1: Secondary | ICD-10-CM | POA: Diagnosis present

## 2020-12-14 DIAGNOSIS — F419 Anxiety disorder, unspecified: Secondary | ICD-10-CM | POA: Diagnosis present

## 2020-12-14 LAB — BRAIN NATRIURETIC PEPTIDE: B Natriuretic Peptide: 731.7 pg/mL — ABNORMAL HIGH (ref 0.0–100.0)

## 2020-12-14 LAB — CBC WITH DIFFERENTIAL/PLATELET
Abs Immature Granulocytes: 0.01 10*3/uL (ref 0.00–0.07)
Basophils Absolute: 0 10*3/uL (ref 0.0–0.1)
Basophils Relative: 1 %
Eosinophils Absolute: 0.1 10*3/uL (ref 0.0–0.5)
Eosinophils Relative: 1 %
HCT: 26 % — ABNORMAL LOW (ref 36.0–46.0)
Hemoglobin: 8.2 g/dL — ABNORMAL LOW (ref 12.0–15.0)
Immature Granulocytes: 0 %
Lymphocytes Relative: 18 %
Lymphs Abs: 1 10*3/uL (ref 0.7–4.0)
MCH: 26.3 pg (ref 26.0–34.0)
MCHC: 31.5 g/dL (ref 30.0–36.0)
MCV: 83.3 fL (ref 80.0–100.0)
Monocytes Absolute: 0.5 10*3/uL (ref 0.1–1.0)
Monocytes Relative: 8 %
Neutro Abs: 4.1 10*3/uL (ref 1.7–7.7)
Neutrophils Relative %: 72 %
Platelets: 351 10*3/uL (ref 150–400)
RBC: 3.12 MIL/uL — ABNORMAL LOW (ref 3.87–5.11)
RDW: 16.6 % — ABNORMAL HIGH (ref 11.5–15.5)
WBC: 5.6 10*3/uL (ref 4.0–10.5)
nRBC: 0 % (ref 0.0–0.2)

## 2020-12-14 LAB — GLUCOSE, CAPILLARY: Glucose-Capillary: 155 mg/dL — ABNORMAL HIGH (ref 70–99)

## 2020-12-14 LAB — MRSA NEXT GEN BY PCR, NASAL: MRSA by PCR Next Gen: NOT DETECTED

## 2020-12-14 LAB — COMPREHENSIVE METABOLIC PANEL
ALT: 10 U/L (ref 0–44)
AST: 19 U/L (ref 15–41)
Albumin: 3.8 g/dL (ref 3.5–5.0)
Alkaline Phosphatase: 37 U/L — ABNORMAL LOW (ref 38–126)
Anion gap: 6 (ref 5–15)
BUN: 14 mg/dL (ref 8–23)
CO2: 33 mmol/L — ABNORMAL HIGH (ref 22–32)
Calcium: 8.6 mg/dL — ABNORMAL LOW (ref 8.9–10.3)
Chloride: 91 mmol/L — ABNORMAL LOW (ref 98–111)
Creatinine, Ser: 1.15 mg/dL — ABNORMAL HIGH (ref 0.44–1.00)
GFR, Estimated: 48 mL/min — ABNORMAL LOW (ref >60)
Glucose, Bld: 161 mg/dL — ABNORMAL HIGH (ref 70–99)
Potassium: 4.2 mmol/L (ref 3.5–5.1)
Sodium: 130 mmol/L — ABNORMAL LOW (ref 135–145)
Total Bilirubin: 0.6 mg/dL (ref 0.3–1.2)
Total Protein: 6.9 g/dL (ref 6.5–8.1)

## 2020-12-14 LAB — TROPONIN I (HIGH SENSITIVITY)
Troponin I (High Sensitivity): 48 ng/L — ABNORMAL HIGH (ref <18)
Troponin I (High Sensitivity): 47 ng/L — ABNORMAL HIGH (ref <18)

## 2020-12-14 LAB — RESP PANEL BY RT-PCR (FLU A&B, COVID) ARPGX2
Influenza A by PCR: NEGATIVE
Influenza B by PCR: NEGATIVE
SARS Coronavirus 2 by RT PCR: NEGATIVE

## 2020-12-14 LAB — D-DIMER, QUANTITATIVE: D-Dimer, Quant: 3.41 ug/mL-FEU — ABNORMAL HIGH (ref 0.00–0.50)

## 2020-12-14 MED ORDER — ALENDRONATE SODIUM 70 MG PO TABS: 70.0000 mg | ORAL_TABLET | ORAL | Status: DC

## 2020-12-14 MED ORDER — METOPROLOL TARTRATE 5 MG/5ML IV SOLN
5.0000 mg | Freq: Four times a day (QID) | INTRAVENOUS | Status: DC | PRN
  Administered 2020-12-14 – 2020-12-15 (×2): 5 mg via INTRAVENOUS
  Filled 2020-12-14 (×3): qty 5

## 2020-12-14 MED ORDER — GABAPENTIN 100 MG PO CAPS
100.0000 mg | ORAL_CAPSULE | Freq: Every day | ORAL | Status: DC
  Administered 2020-12-14 – 2020-12-20 (×7): 100 mg via ORAL
  Filled 2020-12-14 (×7): qty 1

## 2020-12-14 MED ORDER — FUROSEMIDE 10 MG/ML IJ SOLN
40.0000 mg | Freq: Two times a day (BID) | INTRAMUSCULAR | Status: DC
  Administered 2020-12-14 – 2020-12-17 (×7): 40 mg via INTRAVENOUS
  Filled 2020-12-14 (×7): qty 4

## 2020-12-14 MED ORDER — ASPIRIN 81 MG PO TBEC: 81.0000 mg | DELAYED_RELEASE_TABLET | Freq: Every day | ORAL | Status: DC

## 2020-12-14 MED ORDER — ORAL CARE MOUTH RINSE
15.0000 mL | Freq: Two times a day (BID) | OROMUCOSAL | Status: DC
  Administered 2020-12-14 – 2020-12-21 (×12): 15 mL via OROMUCOSAL

## 2020-12-14 MED ORDER — CLOPIDOGREL BISULFATE 75 MG PO TABS
75.0000 mg | ORAL_TABLET | Freq: Every day | ORAL | Status: DC
  Administered 2020-12-15 – 2020-12-21 (×7): 75 mg via ORAL
  Filled 2020-12-14 (×7): qty 1

## 2020-12-14 MED ORDER — HYDRALAZINE HCL 20 MG/ML IJ SOLN
10.0000 mg | Freq: Four times a day (QID) | INTRAMUSCULAR | Status: DC | PRN
  Administered 2020-12-14: 10 mg via INTRAVENOUS
  Filled 2020-12-14 (×2): qty 1

## 2020-12-14 MED ORDER — HYDRALAZINE HCL 50 MG PO TABS
50.0000 mg | ORAL_TABLET | Freq: Three times a day (TID) | ORAL | Status: DC
  Administered 2020-12-14 – 2020-12-21 (×22): 50 mg via ORAL
  Filled 2020-12-14 (×22): qty 1

## 2020-12-14 MED ORDER — SIMVASTATIN 40 MG PO TABS
40.0000 mg | ORAL_TABLET | Freq: Every day | ORAL | Status: DC
  Administered 2020-12-14 – 2020-12-20 (×7): 40 mg via ORAL
  Filled 2020-12-14 (×7): qty 1

## 2020-12-14 MED ORDER — VITAMIN B-12 1000 MCG PO TABS
1000.0000 ug | ORAL_TABLET | Freq: Every day | ORAL | Status: DC
  Administered 2020-12-15 – 2020-12-21 (×7): 1000 ug via ORAL
  Filled 2020-12-14 (×7): qty 1

## 2020-12-14 MED ORDER — METHIMAZOLE 5 MG PO TABS
2.5000 mg | ORAL_TABLET | Freq: Every day | ORAL | Status: DC
  Administered 2020-12-15 – 2020-12-21 (×7): 2.5 mg via ORAL
  Filled 2020-12-14 (×7): qty 1

## 2020-12-14 MED ORDER — MELATONIN 5 MG PO TABS
10.0000 mg | ORAL_TABLET | Freq: Every day | ORAL | Status: DC
  Administered 2020-12-14 – 2020-12-20 (×7): 10 mg via ORAL
  Filled 2020-12-14 (×7): qty 2

## 2020-12-14 MED ORDER — PANTOPRAZOLE SODIUM 40 MG PO TBEC
40.0000 mg | DELAYED_RELEASE_TABLET | Freq: Every morning | ORAL | Status: DC
  Administered 2020-12-15 – 2020-12-21 (×7): 40 mg via ORAL
  Filled 2020-12-14 (×7): qty 1

## 2020-12-14 MED ORDER — FUROSEMIDE 10 MG/ML IJ SOLN
60.0000 mg | Freq: Once | INTRAMUSCULAR | Status: AC
  Administered 2020-12-14: 60 mg via INTRAVENOUS
  Filled 2020-12-14: qty 8

## 2020-12-14 MED ORDER — ATENOLOL 50 MG PO TABS
50.0000 mg | ORAL_TABLET | Freq: Two times a day (BID) | ORAL | Status: DC
  Administered 2020-12-14 – 2020-12-21 (×14): 50 mg via ORAL
  Filled 2020-12-14 (×2): qty 1
  Filled 2020-12-14 (×2): qty 2
  Filled 2020-12-14 (×10): qty 1

## 2020-12-14 MED ORDER — CHLORHEXIDINE GLUCONATE CLOTH 2 % EX PADS
6.0000 | MEDICATED_PAD | Freq: Every day | CUTANEOUS | Status: DC
  Administered 2020-12-14: 6 via TOPICAL

## 2020-12-14 MED ORDER — DIAZEPAM 2 MG PO TABS
2.0000 mg | ORAL_TABLET | Freq: Two times a day (BID) | ORAL | Status: DC | PRN
  Administered 2020-12-14 – 2020-12-19 (×5): 2 mg via ORAL
  Filled 2020-12-14 (×5): qty 1

## 2020-12-14 MED ORDER — ACETAMINOPHEN 325 MG PO TABS
650.0000 mg | ORAL_TABLET | Freq: Four times a day (QID) | ORAL | Status: DC | PRN
  Administered 2020-12-15 – 2020-12-19 (×5): 650 mg via ORAL
  Filled 2020-12-14 (×6): qty 2

## 2020-12-14 MED ORDER — HEPARIN SODIUM (PORCINE) 5000 UNIT/ML IJ SOLN
5000.0000 [IU] | Freq: Three times a day (TID) | INTRAMUSCULAR | Status: DC
  Administered 2020-12-14 – 2020-12-21 (×21): 5000 [IU] via SUBCUTANEOUS
  Filled 2020-12-14 (×21): qty 1

## 2020-12-14 MED ORDER — GUAIFENESIN ER 600 MG PO TB12
600.0000 mg | ORAL_TABLET | Freq: Two times a day (BID) | ORAL | Status: DC
  Administered 2020-12-14 – 2020-12-21 (×14): 600 mg via ORAL
  Filled 2020-12-14 (×14): qty 1

## 2020-12-14 MED ORDER — SODIUM CHLORIDE 0.9 % IV SOLN: INTRAVENOUS | Status: DC

## 2020-12-14 MED ORDER — HYDRALAZINE HCL 100 MG PO TABS: 50.0000 mg | ORAL_TABLET | Freq: Three times a day (TID) | ORAL | Status: DC

## 2020-12-14 MED ORDER — ASPIRIN EC 81 MG PO TBEC
81.0000 mg | DELAYED_RELEASE_TABLET | Freq: Every day | ORAL | Status: DC
  Administered 2020-12-15 – 2020-12-21 (×7): 81 mg via ORAL
  Filled 2020-12-14 (×7): qty 1

## 2020-12-14 MED ORDER — ACETAMINOPHEN 650 MG RE SUPP: 650.0000 mg | Freq: Four times a day (QID) | RECTAL | Status: DC | PRN

## 2020-12-14 NOTE — H&P (Signed)
History and Physical    Mandy [name] ZOX:[id number] DOB: [birth year] DOA: 12/14/2020  PCP: [name], MD  Patient coming from: Home  Chief Complaint: dyspnea  HPI: Mandy [name] is a 80 y.o. female with medical history significant of diastolic HF, HTN, HLD, pulmonary HTN, CVA, DM2, chronic hypoxic respiratory failure. Presenting with dyspnea. History is from daughter. After she was released from the hospital on 12/6; so was initially doing ok. She began to complain of dry cough a couple of days ago. Yesterday she started complaining of sharp left pain that was exacerbated w/ cough. She found it more difficult to breathe. Her daughter gave her APAP and albuterol. She increased her home O2 from 2L to 4L. This seemed to help a little. This morning when she woke, she complained again of shortness of breath. So her daughter called for EMS. She denies any other aggravating or alleviating factors.   ED Course: CXR shows pulm edema. She was given lasix and placed on BiPap. [facility] was called for admission.   Review of Systems:   PMHx Past Medical History:  Diagnosis Date   Aortic atherosclerosis ([place]) 10/08/2018   Arthritis    Diabetes mellitus without complication (HCC)    GERD (gastroesophageal reflux disease)    Hepatic steatosis 10/08/2018   Hyperlipidemia    Hypertension    Hyperthyroidism    Mass of right ovary 10/08/2018   Osteoporosis    Pneumonia    SBO (small bowel obstruction) ([place]) 08/09/2018   SBO (small bowel obstruction) ([place]) 08/09/2018    PSHx Past Surgical History:  Procedure Laterality Date   CATARACT EXTRACTION W/ INTRAOCULAR LENS  IMPLANT, BILATERAL     COLONOSCOPY     LEFT HEART CATH AND CORONARY ANGIOGRAPHY N/A 11/30/2020   Procedure: LEFT HEART CATH AND CORONARY ANGIOGRAPHY;  Surgeon: Early Osmond, MD;  Location: [place] [facility];  Service: Cardiovascular;  Laterality: N/A;   RIGHT/LEFT HEART CATH AND CORONARY ANGIOGRAPHY N/A 11/30/2020   Procedure:  RIGHT/LEFT HEART CATH AND CORONARY ANGIOGRAPHY;  Surgeon: Early Osmond, MD;  Location: [place];  Service: Cardiovascular;  Laterality: N/A;    SocHx  reports that she has never smoked. She has never used smokeless tobacco. She reports that she does not drink alcohol and does not use drugs.  No Known Allergies  FamHx Family History  Problem Relation Age of Onset   Kidney disease Son    Colon cancer Neg Hx    Breast cancer Neg Hx     Prior to Admission medications   Medication Sig Start Date End Date Taking? Authorizing Provider  acetaminophen (TYLENOL) 500 MG tablet Take 1,000 mg by mouth every 6 (six) hours as needed for fever or headache (pain).    [provider]  albuterol (PROVENTIL) (2.5 MG/3ML) 0.083% nebulizer solution Take 2.5 mg by nebulization every 6 (six) hours as needed for wheezing or shortness of breath.    [provider]  albuterol (VENTOLIN HFA) 108 (90 Base) MCG/ACT inhaler Inhale 2 puffs into the lungs every 6 (six) hours as needed for wheezing or shortness of breath. 06/30/20   Thurnell Lose, MD  alendronate (FOSAMAX) 70 MG tablet Take 70 mg by mouth every Tuesday. Take with a full glass of water on an empty stomach.    [provider]  aspirin EC 81 MG EC tablet Take 1 tablet (81 mg total) by mouth daily for 21 days. Swallow whole. 12/08/20 12/29/20  Bonnielee Haff, MD  atenolol (TENORMIN)  50 MG tablet Take 50 mg by mouth 2 (two) times daily.    [provider]  calcium carbonate (OSCAL) 1500 (600 Ca) MG TABS tablet Take 600 mg of elemental calcium by mouth 2 (two) times daily with a meal.    [provider]  clopidogrel (PLAVIX) 75 MG tablet Take 1 tablet (75 mg total) by mouth daily. 12/08/20   Bonnielee Haff, MD  diazepam (VALIUM) 2 MG tablet Take 2 mg by mouth every 12 (twelve) hours as needed for anxiety.    [provider]  diclofenac sodium (VOLTAREN) 1 % GEL Apply 4 g topically 4 (four)  times daily as needed for pain. shoulder 09/13/18   [provider]  furosemide (LASIX) 20 MG tablet Take 3 tablets (60 mg total) by mouth daily. 12/08/20   Bonnielee Haff, MD  gabapentin (NEURONTIN) 100 MG capsule Take 100 mg by mouth at bedtime. 07/18/18   [provider]  guaiFENesin (MUCINEX) 600 MG 12 hr tablet Take 600 mg by mouth 2 (two) times daily as needed for cough.    [provider]  hydrALAZINE (APRESOLINE) 100 MG tablet Take 0.5 tablets (50 mg total) by mouth 3 (three) times daily. 10/09/20   Nicole Kindred A, DO  melatonin 5 MG TABS Take 10 mg by mouth at bedtime.    [provider]  methimazole (TAPAZOLE) 5 MG tablet Take 0.5 tablets (2.5 mg total) by mouth daily. 02/04/19   Hosie Poisson, MD  Multiple Vitamin (MULTIVITAMIN WITH MINERALS) TABS tablet Take 1 tablet by mouth daily.    [provider]  OXYGEN Inhale 2-4 L into the lungs continuous.    [provider]  pantoprazole (PROTONIX) 40 MG tablet Take 1 tablet (40 mg total) by mouth daily at 6 (six) AM. Patient taking differently: Take 40 mg by mouth every morning. 06/30/20   Thurnell Lose, MD  potassium chloride SA (KLOR-CON M) 20 MEQ tablet Take 1 tablet (20 mEq total) by mouth daily. 12/08/20   Bonnielee Haff, MD  Pseudoeph-CPM-DM-APAP [facility] FLU/COLD/COUGH PO) Take 30 mLs by mouth 2 (two) times daily as needed (cough/congestion).    [provider]  simvastatin (ZOCOR) 40 MG tablet Take 1 tablet (40 mg total) by mouth at bedtime. 12/08/20   Bonnielee Haff, MD  vitamin B-12 (CYANOCOBALAMIN) 1000 MCG tablet Take 1,000 mcg by mouth daily.    [provider]  vitamin C (ASCORBIC ACID) 500 MG tablet Take 500 mg by mouth daily.    [provider]  Vitamin D, Cholecalciferol, 25 MCG (1000 UT) TABS Take 1,000 Units by mouth daily.    [provider]  zinc gluconate 50 MG tablet Take 50 mg by mouth daily.    [provider]     Physical Exam: Vitals:   12/14/20 1220 12/14/20 1230 12/14/20 1245 12/14/20 1300  BP: (!) 178/77 (!) 170/81 (!) 170/71 (!) 170/76  Pulse: 68 69 67 70  Resp: 20 20 20  (!) 21  SpO2: 99% 98% 99% 99%    General: 80 y.o. female resting in bed in NAD Eyes: PERRL, normal sclera ENMT: Nares patent w/o discharge, orophaynx clear, dentition normal, ears w/o discharge/lesions/ulcers Neck: Supple, trachea midline Cardiovascular: RRR, +S1, S2, no m/g/r, equal pulses throughout Respiratory: decreased at bases, no w/r, increased WOB on BiPap GI: BS+, NDNT, no masses noted, no organomegaly noted MSK: No e/c/c Neuro: Alert and following commands, no focal deficits Psyc: Appropriate interaction and affect, calm/cooperative  Labs on Admission: I have  personally reviewed following labs and imaging studies  CBC: Recent Labs  Lab 12/08/20 0147 12/14/20 1101  WBC 5.6 5.6  NEUTROABS  --  4.1  HGB 7.9* 8.2*  HCT 24.7* 26.0*  MCV 81.8 83.3  PLT 334 725   Basic Metabolic Panel: Recent Labs  Lab 12/08/20 0147 12/14/20 1101  NA 130* 130*  K 4.7 4.2  CL 88* 91*  CO2 32 33*  GLUCOSE 92 161*  BUN 18 14  CREATININE 1.36* 1.15*  CALCIUM 8.7* 8.6*   GFR: Estimated Creatinine Clearance: 36 mL/min (A) (by C-G formula based on SCr of 1.15 mg/dL (H)). Liver Function Tests: Recent Labs  Lab 12/14/20 1101  AST 19  ALT 10  ALKPHOS 37*  BILITOT 0.6  PROT 6.9  ALBUMIN 3.8   No results for input(s): LIPASE, AMYLASE in the last 168 hours. No results for input(s): AMMONIA in the last 168 hours. Coagulation Profile: No results for input(s): INR, PROTIME in the last 168 hours. Cardiac Enzymes: No results for input(s): CKTOTAL, CKMB, CKMBINDEX, TROPONINI in the last 168 hours. BNP (last 3 results) No results for input(s): PROBNP in the last 8760 hours. HbA1C: No results for input(s): HGBA1C in the last 72 hours. CBG: No results for input(s): GLUCAP in the last 168 hours. Lipid  Profile: No results for input(s): CHOL, HDL, LDLCALC, TRIG, CHOLHDL, LDLDIRECT in the last 72 hours. Thyroid Function Tests: No results for input(s): TSH, T4TOTAL, FREET4, T3FREE, THYROIDAB in the last 72 hours. Anemia Panel: No results for input(s): VITAMINB12, FOLATE, FERRITIN, TIBC, IRON, RETICCTPCT in the last 72 hours. Urine analysis:    Component Value Date/Time   COLORURINE YELLOW 09/24/2020 1536   APPEARANCEUR HAZY (A) 09/24/2020 1536   LABSPEC 1.016 09/24/2020 1536   PHURINE 5.0 09/24/2020 1536   GLUCOSEU NEGATIVE 09/24/2020 1536   HGBUR NEGATIVE 09/24/2020 1536   BILIRUBINUR NEGATIVE 09/24/2020 1536   KETONESUR NEGATIVE 09/24/2020 1536   PROTEINUR NEGATIVE 09/24/2020 1536   NITRITE NEGATIVE 09/24/2020 1536   LEUKOCYTESUR TRACE (A) 09/24/2020 1536    Radiological Exams on Admission: DG Chest Port 1 View  Result Date: 12/14/2020 CLINICAL DATA:  Shortness of breath EXAM: PORTABLE CHEST 1 VIEW COMPARISON:  12/05/2020 FINDINGS: Perihilar and bibasilar opacities. Small pleural effusions. No pneumothorax. Cardiomegaly. IMPRESSION: Similar findings of pulmonary edema and small pleural effusions. Electronically Signed   By: Macy Mis M.D.   On: 12/14/2020 11:46    Assessment/Plan Acute on chronic diastolic HF Acute on chronic hypoxic respiratory failure     - admit to inpt, SDU     - wean O2 as able     - lasix 40mg  IV BID, restrict fluids, watch I&O, daily wts     - echo  Chest pain Elevated trp     - chronically elevated trp     - trend, check EKG     - reproducible     - echo ordered as above     - recent LHC on 11/29: no significant CAD     DM2     - diet controlled     - daily glucose     - DM diet when off NPO status  PAH     - continue home regimen  HTN     - continue home regimen as BP tolerated  HLD     - continue home statin  CKD3a     - at baseline, watch nephrotoxins  Hyponatremia     - mild, getting lasix  for HF, follow  Hx of  neuropathy     - continue home regimen  Hx of anxiety     - continue home regimen  Normocytic anemia     - Hgb stable from recent discharged, no evidence of bleed; follow  Hx of CVA     - continue home antiplatelets, statin  Hyperthyroidism     - continue   DVT prophylaxis: heparin  Code Status: FULL  Family Communication: w/ daughter by phone  Consults called: None   Status is: Inpatient  Remains inpatient appropriate because: severity of illness  Kylin Dubs [place] DO [facility]  If 7PM-7AM, please contact night-coverage [email or web address]  12/14/2020, 1:10 PM

## 2020-12-14 NOTE — ED Triage Notes (Signed)
Pt BIB [facility] from home for [facility]. EMS reports okay SpO2 on room air but increasing lethargy and diminishing breath sounds, left worse than right. Placed on CPAP, mentation improved.   BP 160/100

## 2020-12-14 NOTE — ED Provider Notes (Signed)
[facility] DEPT Provider Note   CSN: [id number] Arrival date & time: 12/14/20  1023     History No chief complaint on file.   Mandy [name] is a 80 y.o. female.  80 year old female presents with shortness of breath and left-sided chest pain has been going on for several days.  Patient's daughter was used to interpret as we could not find an interpreter on the video machine.  Pain is characterized as sharp and left-sided and worse with taking a deep breath.  Patient is chronically on home oxygen at 2 L and she has had to increase this to 4 L as of late.  Mild cough appreciated.  Some orthopnea.  EMS called and patient was hypoxemic and was placed on CPAP and transported.      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   Chest pain of uncertain etiology    Severe pulmonary hypertension (HCC)    Chronic respiratory failure with hypoxia, on home oxygen therapy ([place]) 11/24/2020   Anxiety 11/24/2020   Acute respiratory failure with hypoxia and hypercapnia ([place]) 11/23/2020   Acute on chronic heart failure with preserved ejection fraction (HFpEF) ([employer]) 11/23/2020   Hypoxia 11/03/2020   Pleural effusion on right 10/01/2020   Hyperkalemia 10/01/2020   AKI (acute kidney injury) ([place]) 10/01/2020   Anemia 10/01/2020   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   Acute respiratory failure with hypoxia ([place]) 06/26/2020    Hypertensive urgency 06/26/2020   Hypokalemia 06/26/2020   Generalized weakness 06/26/2020   Dizziness 06/26/2020   Obesity (BMI 30-39.9) 06/26/2020   Hyperthyroidism 06/26/2020   Chronic diastolic CHF (congestive heart failure) ([place]) 06/26/2020   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   Prolonged QT interval 01/31/2019   Hyponatremia 41/66/0630   Acute metabolic encephalopathy 16/01/930   COVID-19 virus infection 01/31/2019   Mass of right ovary 10/08/2018   Hepatic steatosis 10/08/2018   Aortic atherosclerosis ([place]) 10/08/2018   SBO (small bowel obstruction) ([place]) 08/09/2018   Diabetes mellitus type 2, controlled ([place]) 04/07/2008   Dyslipidemia 04/07/2008   Essential hypertension 04/07/2008   SORE THROAT 04/07/2008   GERD 04/07/2008    Past Surgical History:  Procedure Laterality Date   CATARACT EXTRACTION W/ INTRAOCULAR LENS  IMPLANT, BILATERAL     COLONOSCOPY     LEFT HEART CATH AND CORONARY ANGIOGRAPHY N/A 11/30/2020   Procedure: LEFT HEART CATH AND CORONARY ANGIOGRAPHY;  Surgeon: Early Osmond, MD;  Location: [facility];  Service: Cardiovascular;  Laterality: N/A;   RIGHT/LEFT HEART CATH AND CORONARY ANGIOGRAPHY N/A 11/30/2020   Procedure: RIGHT/LEFT HEART CATH AND CORONARY ANGIOGRAPHY;  Surgeon: Early Osmond, MD;  Location: [facility];  Service: Cardiovascular;  Laterality: N/A;     OB History   No obstetric history on file.     Family History  Problem Relation Age of Onset   Kidney disease Son  Colon cancer Neg Hx    Breast cancer Neg Hx     Social History   Tobacco Use   Smoking status: Never   Smokeless tobacco: Never  Vaping Use   Vaping Use: Never used  Substance Use Topics   Alcohol use: No   Drug use: No    Home Medications Prior to Admission medications   Medication Sig Start Date End Date Taking? Authorizing Provider  acetaminophen (TYLENOL) 500 MG tablet Take 1,000 mg by mouth  every 6 (six) hours as needed for fever or headache (pain).    [provider]  albuterol (PROVENTIL) (2.5 MG/3ML) 0.083% nebulizer solution Take 2.5 mg by nebulization every 6 (six) hours as needed for wheezing or shortness of breath.    [provider]  albuterol (VENTOLIN HFA) 108 (90 Base) MCG/ACT inhaler Inhale 2 puffs into the lungs every 6 (six) hours as needed for wheezing or shortness of breath. 06/30/20   Thurnell Lose, MD  alendronate (FOSAMAX) 70 MG tablet Take 70 mg by mouth every Tuesday. Take with a full glass of water on an empty stomach.    [provider]  aspirin EC 81 MG EC tablet Take 1 tablet (81 mg total) by mouth daily for 21 days. Swallow whole. 12/08/20 12/29/20  Bonnielee Haff, MD  atenolol (TENORMIN) 50 MG tablet Take 50 mg by mouth 2 (two) times daily.    [provider]  calcium carbonate (OSCAL) 1500 (600 Ca) MG TABS tablet Take 600 mg of elemental calcium by mouth 2 (two) times daily with a meal.    [provider]  clopidogrel (PLAVIX) 75 MG tablet Take 1 tablet (75 mg total) by mouth daily. 12/08/20   Bonnielee Haff, MD  diazepam (VALIUM) 2 MG tablet Take 2 mg by mouth every 12 (twelve) hours as needed for anxiety.    [provider]  diclofenac sodium (VOLTAREN) 1 % GEL Apply 4 g topically 4 (four) times daily as needed for pain. shoulder 09/13/18   [provider]  furosemide (LASIX) 20 MG tablet Take 3 tablets (60 mg total) by mouth daily. 12/08/20   Bonnielee Haff, MD  gabapentin (NEURONTIN) 100 MG capsule Take 100 mg by mouth at bedtime. 07/18/18   [provider]  guaiFENesin (MUCINEX) 600 MG 12 hr tablet Take 600 mg by mouth 2 (two) times daily as needed for cough.    [provider]  hydrALAZINE (APRESOLINE) 100 MG tablet Take 0.5 tablets (50 mg total) by mouth 3 (three) times daily. 10/09/20   Nicole Kindred A, DO  melatonin 5 MG TABS Take 10 mg by mouth at bedtime.    [provider]  methimazole (TAPAZOLE) 5 MG tablet Take 0.5 tablets (2.5 mg total) by mouth daily. 02/04/19   Hosie Poisson, MD  Multiple Vitamin (MULTIVITAMIN WITH MINERALS) TABS tablet Take 1 tablet by mouth daily.    [provider]  OXYGEN Inhale 2-4 L into the lungs continuous.    [provider]  pantoprazole (PROTONIX) 40 MG tablet Take 1 tablet (40 mg total) by mouth daily at 6 (six) AM. Patient taking differently: Take 40 mg by mouth every morning. 06/30/20   Thurnell Lose, MD  potassium chloride SA (KLOR-CON M) 20 MEQ tablet Take 1 tablet (20 mEq total) by mouth daily. 12/08/20   Bonnielee Haff, MD  Pseudoeph-CPM-DM-APAP [facility] FLU/COLD/COUGH PO) Take 30 mLs by mouth 2 (two) times daily as needed (cough/congestion).    [provider]  simvastatin (ZOCOR) 40 MG tablet Take 1 tablet (40 mg total) by mouth at bedtime. 12/08/20   Bonnielee Haff, MD  vitamin B-12 (CYANOCOBALAMIN) 1000 MCG tablet Take 1,000 mcg by mouth daily.    [provider]  vitamin C (ASCORBIC ACID) 500 MG tablet Take 500 mg by mouth daily.    [provider]  Vitamin D, Cholecalciferol, 25 MCG (1000 UT) TABS Take 1,000 Units by mouth daily.    [provider]  zinc gluconate 50 MG tablet Take 50 mg by mouth daily.    [provider]    Allergies    Patient has no known allergies.  Review of Systems   Review of Systems  Unable to perform ROS: Acuity of condition   Physical Exam Updated Vital Signs There were no vitals taken for this visit.  Physical Exam Vitals and nursing note reviewed.  Constitutional:      General: She is not in acute distress.    Appearance: Normal appearance. She is well-developed. She is not toxic-appearing.  HENT:     Head: Normocephalic and atraumatic.  Eyes:     General: Lids are normal.     Conjunctiva/sclera: Conjunctivae normal.     Pupils: Pupils are equal, round, and reactive to light.  Neck:     Thyroid:  No thyroid mass.     Trachea: No tracheal deviation.  Cardiovascular:     Rate and Rhythm: Normal rate and regular rhythm.     Heart sounds: Normal heart sounds. No murmur heard.   No gallop.  Pulmonary:     Effort: Tachypnea and respiratory distress present.     Breath sounds: No stridor. Examination of the right-lower field reveals decreased breath sounds. Examination of the left-lower field reveals decreased breath sounds. Decreased breath sounds present. No wheezing, rhonchi or rales.  Abdominal:     General: There is no distension.     Palpations: Abdomen is soft.     Tenderness: There is no abdominal tenderness. There is no rebound.  Musculoskeletal:        General: No tenderness. Normal range of motion.     Cervical back: Normal range of motion and neck supple.  Skin:    General: Skin is warm and dry.     Findings: No abrasion or rash.  Neurological:     Mental Status: She is alert and oriented to person, place, and time. Mental status is at baseline.     GCS: GCS eye subscore is 4. GCS verbal subscore is 5. GCS motor subscore is 6.     Cranial Nerves: No cranial nerve deficit.     Sensory: No sensory deficit.     Motor: Motor function is intact.  Psychiatric:        Attention and Perception: Attention normal.        Speech: Speech normal.        Behavior: Behavior normal.    ED Results / Procedures / Treatments   Labs (all labs ordered are listed, but only abnormal results are displayed) Labs Reviewed  RESP PANEL BY RT-PCR (FLU A&B, COVID) ARPGX2  CBC WITH DIFFERENTIAL/PLATELET  COMPREHENSIVE METABOLIC PANEL  BRAIN NATRIURETIC PEPTIDE  D-DIMER, QUANTITATIVE    EKG None  ED ECG REPORT   Date: 12/14/2020  Rate: 77  Rhythm: normal sinus rhythm  QRS Axis: normal  Intervals: normal  ST/T Wave abnormalities: normal  Conduction Disutrbances:none  Narrative Interpretation:   Old EKG Reviewed: none available  I have personally reviewed  the EKG tracing and agree  with the computerized printout as noted.  Radiology No results found.  Procedures Procedures   Medications Ordered in ED Medications  0.9 %  sodium chloride infusion (has no administration in time range)    ED Course  I have reviewed the triage vital signs and the nursing notes.  Pertinent labs & imaging results that were available during my care of the patient were reviewed by me and considered in my medical decision making (see chart for details).    MDM Rules/Calculators/A&P                           Patient's chest x-ray shows pulmonary edema.  BNP elevated as well 2.  Given Lasix 60 mg IV push here.  Will require hospitalization  CRITICAL CARE Performed by: Leota Jacobsen Total critical care time: 45 minutes Critical care time was exclusive of separately billable procedures and treating other patients. Critical care was necessary to treat or prevent imminent or life-threatening deterioration. Critical care was time spent personally by me on the following activities: development of treatment plan with patient and/or surrogate as well as nursing, discussions with consultants, evaluation of patient's response to treatment, examination of patient, obtaining history from patient or surrogate, ordering and performing treatments and interventions, ordering and review of laboratory studies, ordering and review of radiographic studies, pulse oximetry and re-evaluation of patient's condition.  Final Clinical Impression(s) / ED Diagnoses Final diagnoses:  None    Rx / DC Orders ED Discharge Orders     None        Lacretia Leigh, MD 12/14/20 1223

## 2020-12-14 NOTE — ED Notes (Signed)
Pt transported to CT ?

## 2020-12-14 NOTE — Progress Notes (Signed)
Notified of patient dizziness, neuro change. [place] ordered. Will follow.

## 2020-12-14 NOTE — Progress Notes (Signed)
Pt c/o dizziness. BP 174/71. Improvement after PRN lopressor. O2 100% on 4LNC. R pupil non-reactive. MD notified. Awaiting orders.

## 2020-12-14 NOTE — Progress Notes (Signed)
PHARMACIST - PHYSICIAN COMMUNICATION  CONCERNING: P&T Medication Policy Regarding Oral Bisphosphonates  RECOMMENDATION: Your order for alendronate (Fosamax) has been discontinued at this time.  If the patient's post-hospital medical condition warrants safe use of this class of drugs, please resume the pre-hospital regimen upon discharge.  DESCRIPTION:  Alendronate (Fosamax), ibandronate (Boniva), and risedronate (Actonel) can cause severe esophageal erosions in patients who are unable to remain upright at least 30 minutes after taking this medication.   Since brief interruptions in therapy are thought to have minimal impact on bone mineral density, the [place] has established that bisphosphonate orders should be routinely discontinued during hospitalization.   To override this safety policy and permit administration of Boniva, Fosamax, or Actonel in the hospital, prescribers must write "DO NOT HOLD" in the comments section when placing the order for this class of medications.  Dia Sitter, PharmD, BCPS 12/14/2020 3:07 PM

## 2020-12-14 NOTE — Progress Notes (Signed)
Pt complaining of SOB, with tachypnea and labored breathing. Placed on bipap by respiratory. Will continue to monitor.

## 2020-12-15 ENCOUNTER — Inpatient Hospital Stay (HOSPITAL_COMMUNITY): Payer: Medicare HMO

## 2020-12-15 ENCOUNTER — Encounter (HOSPITAL_COMMUNITY): Payer: Self-pay | Admitting: Internal Medicine

## 2020-12-15 ENCOUNTER — Other Ambulatory Visit: Payer: Self-pay

## 2020-12-15 DIAGNOSIS — I5033 Acute on chronic diastolic (congestive) heart failure: Secondary | ICD-10-CM

## 2020-12-15 LAB — CBC
HCT: 24.1 % — ABNORMAL LOW (ref 36.0–46.0)
Hemoglobin: 7.6 g/dL — ABNORMAL LOW (ref 12.0–15.0)
MCH: 26.2 pg (ref 26.0–34.0)
MCHC: 31.5 g/dL (ref 30.0–36.0)
MCV: 83.1 fL (ref 80.0–100.0)
Platelets: 321 10*3/uL (ref 150–400)
RBC: 2.9 MIL/uL — ABNORMAL LOW (ref 3.87–5.11)
RDW: 16.8 % — ABNORMAL HIGH (ref 11.5–15.5)
WBC: 4.5 10*3/uL (ref 4.0–10.5)
nRBC: 0 % (ref 0.0–0.2)

## 2020-12-15 LAB — COMPREHENSIVE METABOLIC PANEL
ALT: 9 U/L (ref 0–44)
AST: 15 U/L (ref 15–41)
Albumin: 3.4 g/dL — ABNORMAL LOW (ref 3.5–5.0)
Alkaline Phosphatase: 31 U/L — ABNORMAL LOW (ref 38–126)
Anion gap: 7 (ref 5–15)
BUN: 14 mg/dL (ref 8–23)
CO2: 37 mmol/L — ABNORMAL HIGH (ref 22–32)
Calcium: 8.6 mg/dL — ABNORMAL LOW (ref 8.9–10.3)
Chloride: 92 mmol/L — ABNORMAL LOW (ref 98–111)
Creatinine, Ser: 1.2 mg/dL — ABNORMAL HIGH (ref 0.44–1.00)
GFR, Estimated: 46 mL/min — ABNORMAL LOW (ref >60)
Glucose, Bld: 105 mg/dL — ABNORMAL HIGH (ref 70–99)
Potassium: 4 mmol/L (ref 3.5–5.1)
Sodium: 136 mmol/L (ref 135–145)
Total Bilirubin: 0.5 mg/dL (ref 0.3–1.2)
Total Protein: 6.3 g/dL — ABNORMAL LOW (ref 6.5–8.1)

## 2020-12-15 LAB — ECHOCARDIOGRAM COMPLETE
AR max vel: 1.76 cm2
AV Area VTI: 1.8 cm2
AV Area mean vel: 1.62 cm2
AV Mean grad: 6 mmHg
AV Peak grad: 11.4 mmHg
Ao pk vel: 1.69 m/s
Area-P 1/2: 3.17 cm2
Height: 59 in
S' Lateral: 2.95 cm
Weight: 2670.21 oz

## 2020-12-15 LAB — GLUCOSE, CAPILLARY
Glucose-Capillary: 107 mg/dL — ABNORMAL HIGH (ref 70–99)
Glucose-Capillary: 98 mg/dL (ref 70–99)
Glucose-Capillary: 175 mg/dL — ABNORMAL HIGH (ref 70–99)
Glucose-Capillary: 107 mg/dL — ABNORMAL HIGH (ref 70–99)
Glucose-Capillary: 168 mg/dL — ABNORMAL HIGH (ref 70–99)

## 2020-12-15 MED ORDER — LIDOCAINE HCL 1 % IJ SOLN
INTRAMUSCULAR | Status: AC
  Administered 2020-12-15: 10 mL
  Filled 2020-12-15: qty 20

## 2020-12-15 NOTE — Progress Notes (Signed)
PROGRESS NOTE    Mandy [name]  NWG:[id number] DOB: 11/03/1940 DOA: 12/14/2020 PCP: [name], MD   Chief Complain: Shortness of breath  Brief Narrative:  Patient is a 80 year old female with history of diastolic congestive heart failure, hypertension, hyperlipidemia, pulmonary hypertension, recent ischemic stroke, diabetes type 2, chronic hypoxic respiratory failure who presents from home with complaints of dyspnea.  She was recently admitted here and was discharged on on 12/6 after she was managed for acute on chronic hypoxic respiratory failure from acute on chronic congestive heart failure/pulmonary hypertension and also right recurrent pleural effusion.  During that admission she was also found subacute stroke ,seen by neurology.  Patient is on 2 L of oxygen at home. Chest x-ray this admission showed pulmonary edema.  She was started on IV Lasix, placed on BiPAP.  Echocardiogram has been ordered.  Plan for ultrasound-guided thoracentesis of right side today.  Assessment & Plan:   Principal Problem:   CHF exacerbation ([place])   Acute on chronic hypoxic respiratory failure: Multifactorial.  Secondary to diastolic congestive heart failure exacerbation, pulmonary hypertension. Recently admitted for the same and was discharged . On 2 L of oxygen at home.  Had to be put on 4 L of oxygen on  admission, put on BiPAP at night. Will continue to try to wean oxygen.  Continue BiPAP as needed at night. CXR showed features of pulmonary edema.  Elevated BNP.  CT chest showed collapse/consolidative changes in the right middle lobe and right lower lobes( same as previous imagings) and also showed  moderate right and small left pleural effusions.She has history of recurrent right-sided pleural effusion and underwent thoracentesis on last admission.  We will try thoracentesis again. She needs to follow-up with pulmonology as an outpatient.  Acute on chronic diastolic congestive heart failure:Last  echocardiogram showed normal systolic function, severe pulmonary hypertension.  Underwent left/right heart cath on 11/19 which did not show any coronary artery disease.  Cardiology recommended medical management.  Takes aspirin, atenolol, hydralazine, Lasix at home. Continue to monitor input/output.  Continue IV Lasix for now.  New echo has been ordered.  She is to follow-up with cardiology as an outpatient  History of recent CVA: Complains of intermittent dizziness.  No dizziness this morning.  On last admission, she was found to have subacute infarct in the right MCA territory.  CT head done on this admission showed the same, no new findings.  She was seen by neurology on last admission and has been recommended aspirin and Plavix for 3 weeks followed by Plavix alone.  Also recommended 30 day event  monitor/loop recorder as an outpatient  Chest pain: currently chest pain-free.  Elevated troponin.  This is most likely from demand ischemia from CHF.  Chest pain was reproducible.  Echo has been ordered. Recent cath did not show any coronary artery disease  Diabetes type 2: Diet controlled. Last A1c of 5.7. Monitor blood sugars  Hypertension: Continue current medications.  Monitor blood pressure.  Continue current medications of her severe hypertension  Hyperlipidemia: On statin  CKD stage IIIa: Currently kidney function at baseline.  Monitor  Mild hyponatremia: Most likely from hypervolemia.  Improved with diuresis.  Continue diuresis, monitor electrolytes  Normocytic anemia: Hemoglobin has been stable since discharge.  No evidence of blood loss.Monitor  History of hypothyroidism: On methimazole  Morbid obesity: BMI of 33.7          DVT prophylaxis:Heparin [place] Code Status: Full Family Communication: None at the bedside Patient status: Inpatient  Dispo: The patient is from: Home              Anticipated d/c is to: Home              Anticipated d/c date is: In next 2 to 3  days  Consultants: None  Procedures: None  Antimicrobials:  Anti-infectives (From admission, onward)    None       Subjective:  Patient seen and examined at the bedside this morning.  During my evaluation, she was overall comfortable.  She was speaking in full sentences and was on nasal cannula.  She says she feels better today.  Complained of some shortness of breath at night.  No chest pain or dizziness during my evaluation.   Objective: Vitals:   12/14/20 2330 12/15/20 0322 12/15/20 0500 12/15/20 0659  BP: (!) 122/38 (!) 130/45  (!) 138/44  Pulse: 70 70    Resp: 15 20    Temp:      TempSrc:      SpO2: 100% 98%    Weight:   75.7 kg   Height:        Intake/Output Summary (Last 24 hours) at 12/15/2020 7209 Last data filed at 12/14/2020 1700 Gross per 24 hour  Intake 36 ml  Output 1600 ml  Net -1564 ml   Filed Weights   12/14/20 1521 12/15/20 0500  Weight: 77 kg 75.7 kg    Examination:  General exam: Overall comfortable, not in distress, pleasant elderly female, obese HEENT: PERRL Respiratory system: Bibasilar crackles, decreased air entry in the right side Cardiovascular system: S1 & S2 heard, RRR.  Gastrointestinal system: Abdomen is nondistended, soft and nontender. Central nervous system: Alert and oriented Extremities: Trace bilateral lower extremity edema, no clubbing ,no cyanosis Skin: No rashes, no ulcers,no icterus      Data Reviewed: I have personally reviewed following labs and imaging studies  CBC: Recent Labs  Lab 12/14/20 1101 12/15/20 0311  WBC 5.6 4.5  NEUTROABS 4.1  --   HGB 8.2* 7.6*  HCT 26.0* 24.1*  MCV 83.3 83.1  PLT 351 470   Basic Metabolic Panel: Recent Labs  Lab 12/14/20 1101 12/15/20 0311  NA 130* 136  K 4.2 4.0  CL 91* 92*  CO2 33* 37*  GLUCOSE 161* 105*  BUN 14 14  CREATININE 1.15* 1.20*  CALCIUM 8.6* 8.6*   GFR: Estimated Creatinine Clearance: 33.7 mL/min (A) (by C-G formula based on SCr of 1.2 mg/dL  (H)). Liver Function Tests: Recent Labs  Lab 12/14/20 1101 12/15/20 0311  AST 19 15  ALT 10 9  ALKPHOS 37* 31*  BILITOT 0.6 0.5  PROT 6.9 6.3*  ALBUMIN 3.8 3.4*   No results for input(s): LIPASE, AMYLASE in the last 168 hours. No results for input(s): AMMONIA in the last 168 hours. Coagulation Profile: No results for input(s): INR, PROTIME in the last 168 hours. Cardiac Enzymes: No results for input(s): CKTOTAL, CKMB, CKMBINDEX, TROPONINI in the last 168 hours. BNP (last 3 results) No results for input(s): PROBNP in the last 8760 hours. HbA1C: No results for input(s): HGBA1C in the last 72 hours. CBG: Recent Labs  Lab 12/14/20 1707 12/15/20 0226  GLUCAP 155* 107*   Lipid Profile: No results for input(s): CHOL, HDL, LDLCALC, TRIG, CHOLHDL, LDLDIRECT in the last 72 hours. Thyroid Function Tests: No results for input(s): TSH, T4TOTAL, FREET4, T3FREE, THYROIDAB in the last 72 hours. Anemia Panel: No results for input(s): VITAMINB12, FOLATE, FERRITIN, TIBC, IRON, RETICCTPCT in the  last 72 hours. Sepsis Labs: No results for input(s): PROCALCITON, LATICACIDVEN in the last 168 hours.  Recent Results (from the past 240 hour(s))  Resp Panel by RT-PCR (Flu A&B, Covid) Nasopharyngeal Swab     Status: None   Collection Time: 12/14/20 11:34 AM   Specimen: Nasopharyngeal Swab; Nasopharyngeal(NP) swabs in vial transport medium  Result Value Ref Range Status   SARS Coronavirus 2 by RT PCR NEGATIVE NEGATIVE Final    Comment: (NOTE) SARS-CoV-2 target nucleic acids are NOT DETECTED.  The SARS-CoV-2 RNA is generally detectable in upper respiratory specimens during the acute phase of infection. The lowest concentration of SARS-CoV-2 viral copies this assay can detect is 138 copies/mL. A negative result does not preclude SARS-Cov-2 infection and should not be used as the sole basis for treatment or other patient management decisions. A negative result may occur with  improper specimen  collection/handling, submission of specimen other than nasopharyngeal swab, presence of viral mutation(s) within the areas targeted by this assay, and inadequate number of viral copies(<138 copies/mL). A negative result must be combined with clinical observations, patient history, and epidemiological information. The expected result is Negative.  Fact Sheet for Patients:  EntrepreneurPulse.com.au  Fact Sheet for Healthcare Providers:  IncredibleEmployment.be  This test is no t yet approved or cleared by the [place] FDA and  has been authorized for detection and/or diagnosis of SARS-CoV-2 by FDA under an Emergency Use Authorization (EUA). This EUA will remain  in effect (meaning this test can be used) for the duration of the COVID-19 declaration under Section 564(b)(1) of the Act, 21 U.S.C.section 360bbb-3(b)(1), unless the authorization is terminated  or revoked sooner.       Influenza A by PCR NEGATIVE NEGATIVE Final   Influenza B by PCR NEGATIVE NEGATIVE Final    Comment: (NOTE) The Xpert Xpress SARS-CoV-2/FLU/RSV plus assay is intended as an aid in the diagnosis of influenza from Nasopharyngeal swab specimens and should not be used as a sole basis for treatment. Nasal washings and aspirates are unacceptable for Xpert Xpress SARS-CoV-2/FLU/RSV testing.  Fact Sheet for Patients: EntrepreneurPulse.com.au  Fact Sheet for Healthcare Providers: IncredibleEmployment.be  This test is not yet approved or cleared by the [place] FDA and has been authorized for detection and/or diagnosis of SARS-CoV-2 by FDA under an Emergency Use Authorization (EUA). This EUA will remain in effect (meaning this test can be used) for the duration of the COVID-19 declaration under Section 564(b)(1) of the Act, 21 U.S.C. section 360bbb-3(b)(1), unless the authorization is terminated or revoked.  Performed at [facility], [place] [address] [place], [place] [address]   MRSA Next Gen by PCR, Nasal     Status: None   Collection Time: 12/14/20  3:01 PM   Specimen: Nasal Mucosa; Nasal Swab  Result Value Ref Range Status   MRSA by PCR Next Gen NOT DETECTED NOT DETECTED Final    Comment: (NOTE) The GeneXpert MRSA Assay (FDA approved for NASAL specimens only), is one component of a comprehensive MRSA colonization surveillance program. It is not intended to diagnose MRSA infection nor to guide or monitor treatment for MRSA infections. Test performance is not FDA approved in patients less than 17 years old. Performed at [facility], [place] [address], [place], [place] [address]          Radiology Studies: CT HEAD WO CONTRAST (5MM)  Result Date: 12/14/2020 CLINICAL DATA:  Transient ischemic attack EXAM: CT HEAD WITHOUT CONTRAST TECHNIQUE: Contiguous axial images were  obtained from the base of the skull through the vertex without intravenous contrast. COMPARISON:  MRI 12/07/2020.  CT 12/06/2020. FINDINGS: Brain: No focal abnormality affects the brainstem or cerebellum. Cerebral hemispheres show moderate chronic small-vessel ischemic changes of the white matter. Late subacute right frontal cortical and subcortical infarction as shown on the previous studies from 1 week ago. No sign of new insult/acute infarction, mass lesion, hemorrhage, hydrocephalus or extra-axial collection. Vascular: There is atherosclerotic calcification of the major vessels at the base of the brain. Skull: Negative Sinuses/Orbits: Clear/normal Other: None IMPRESSION: No new finding since the previous recent exams. Subacute infarction in the right frontal cortical and subcortical brain. No evidence of hemorrhage or extension. Chronic small-vessel ischemic changes elsewhere within the cerebral hemispheric white matter. Electronically Signed   By: Nelson Chimes M.D.   On: 12/14/2020 20:02   CT CHEST WO  CONTRAST  Result Date: 12/14/2020 CLINICAL DATA:  Shortness of breath and left-sided chest pain. 11/23/2020 EXAM: CT CHEST WITHOUT CONTRAST TECHNIQUE: Multidetector CT imaging of the chest was performed following the standard protocol without IV contrast. COMPARISON:  None. FINDINGS: Cardiovascular: Heart is enlarged. No substantial pericardial effusion. Mild atherosclerotic calcification is noted in the wall of the thoracic aorta. Enlargement of the pulmonary outflow tract and main pulmonary arteries suggests pulmonary arterial hypertension. Mediastinum/Nodes: Scattered upper normal mediastinal lymph nodes similar to prior. No evidence for gross hilar lymphadenopathy although assessment is limited by the lack of intravenous contrast on the current study. The esophagus has normal imaging features. There is no axillary lymphadenopathy. Lungs/Pleura: Collapse/consolidative changes noted in the right middle lobe, similar to prior with associated right lower lobe collapse/consolidation. Moderate right pleural effusion is similar. Focal consolidative opacity in the left suprahilar region is similar to prior, better characterized on previous CT with contrast. Subsegmental atelectasis noted left upper lobe. Small left pleural effusion evident. Upper Abdomen: Unremarkable. Musculoskeletal: No worrisome lytic or sclerotic osseous abnormality. Degenerative changes noted both shoulders. IMPRESSION: 1. Similar appearance of collapse/consolidative changes in the right middle lobe and right lower lobes. 2. Focal consolidative opacity in the left suprahilar region is similar to prior, better characterized on previous CT with contrast. 3. Moderate right and small left pleural effusions, similar to prior. 4. Enlargement of the pulmonary outflow tract and main pulmonary arteries suggests pulmonary arterial hypertension. 5. Aortic Atherosclerosis (ICD10-I70.0). Electronically Signed   By: Misty Stanley M.D.   On: 12/14/2020 14:37    DG Chest Port 1 View  Result Date: 12/14/2020 CLINICAL DATA:  Shortness of breath EXAM: PORTABLE CHEST 1 VIEW COMPARISON:  12/05/2020 FINDINGS: Perihilar and bibasilar opacities. Small pleural effusions. No pneumothorax. Cardiomegaly. IMPRESSION: Similar findings of pulmonary edema and small pleural effusions. Electronically Signed   By: Macy Mis M.D.   On: 12/14/2020 11:46        Scheduled Meds:  aspirin EC  81 mg Oral Daily   atenolol  50 mg Oral BID   Chlorhexidine Gluconate Cloth  6 each Topical Daily   clopidogrel  75 mg Oral Daily   furosemide  40 mg Intravenous BID   gabapentin  100 mg Oral QHS   guaiFENesin  600 mg Oral BID   heparin  5,000 Units Subcutaneous Q8H   hydrALAZINE  50 mg Oral Q8H   mouth rinse  15 mL Mouth Rinse BID   melatonin  10 mg Oral QHS   methimazole  2.5 mg Oral Daily   pantoprazole  40 mg Oral q morning   simvastatin  40  mg Oral QHS   vitamin B-12  1,000 mcg Oral Daily   Continuous Infusions:  sodium chloride Stopped (12/14/20 1458)     LOS: 1 day    Time spent:35 mins,. More than 50% of that time was spent in counseling and/or coordination of care.      Shelly Coss, MD [facility] P12/13/2022, 7:13 AM

## 2020-12-15 NOTE — Procedures (Signed)
Ultrasound-guided  therapeutic right sided thoracentesis performed yielding 600 milliliters of straw colored fluid. No immediate complications.  Follow-up chest x-ray pending. EBL is < 2 ml.      

## 2020-12-15 NOTE — Progress Notes (Signed)
Pt refuses BiPAP qhs.  Pt says no adamantly whenever BiPAP mask is held up.

## 2020-12-15 NOTE — TOC Progression Note (Signed)
Transition of Care [facility]) - Progression Note    Patient Details  Name: Mandy [name] MRN: [id number] Date of Birth: 1940/07/14  [facility] CM/SW Contact  Ross Ludwig, [place] Phone Number: 12/15/2020, 4:02 PM  Clinical Narrative:    Patient recently discharge from [facility].  Patient is active with [facility] PT, OT, RN, and social work.  Patient lives with her daughter plan to return back home.  Patient also has a NIV already from the last hospitalization.  TOC to continue to follow patient's progress throughout discharge planning.        Expected Discharge Plan and Services                                                 Social Determinants of Health (SDOH) Interventions    Readmission Risk Interventions Readmission Risk Prevention Plan 11/06/2020  Transportation Screening Complete  PCP or Specialist Appt within 3-5 Days Complete  HRI or [place] Complete  Social Work Consult for [place] Planning/Counseling Complete  Palliative Care Screening Complete  Medication Review Press photographer) Complete  Some recent data might be hidden

## 2020-12-16 ENCOUNTER — Encounter (HOSPITAL_COMMUNITY): Payer: Self-pay | Admitting: Internal Medicine

## 2020-12-16 DIAGNOSIS — L8990 Pressure ulcer of unspecified site, unspecified stage: Secondary | ICD-10-CM | POA: Insufficient documentation

## 2020-12-16 DIAGNOSIS — J90 Pleural effusion, not elsewhere classified: Secondary | ICD-10-CM

## 2020-12-16 DIAGNOSIS — Z9889 Other specified postprocedural states: Secondary | ICD-10-CM

## 2020-12-16 LAB — BASIC METABOLIC PANEL
Anion gap: 9 (ref 5–15)
BUN: 17 mg/dL (ref 8–23)
CO2: 35 mmol/L — ABNORMAL HIGH (ref 22–32)
Calcium: 8.7 mg/dL — ABNORMAL LOW (ref 8.9–10.3)
Chloride: 91 mmol/L — ABNORMAL LOW (ref 98–111)
Creatinine, Ser: 1.22 mg/dL — ABNORMAL HIGH (ref 0.44–1.00)
GFR, Estimated: 45 mL/min — ABNORMAL LOW (ref >60)
Glucose, Bld: 106 mg/dL — ABNORMAL HIGH (ref 70–99)
Potassium: 3.5 mmol/L (ref 3.5–5.1)
Sodium: 135 mmol/L (ref 135–145)

## 2020-12-16 LAB — CBC WITH DIFFERENTIAL/PLATELET
Abs Immature Granulocytes: 0.02 10*3/uL (ref 0.00–0.07)
Basophils Absolute: 0 10*3/uL (ref 0.0–0.1)
Basophils Relative: 1 %
Eosinophils Absolute: 0.2 10*3/uL (ref 0.0–0.5)
Eosinophils Relative: 3 %
HCT: 27.8 % — ABNORMAL LOW (ref 36.0–46.0)
Hemoglobin: 8.8 g/dL — ABNORMAL LOW (ref 12.0–15.0)
Immature Granulocytes: 0 %
Lymphocytes Relative: 23 %
Lymphs Abs: 1.2 10*3/uL (ref 0.7–4.0)
MCH: 26.5 pg (ref 26.0–34.0)
MCHC: 31.7 g/dL (ref 30.0–36.0)
MCV: 83.7 fL (ref 80.0–100.0)
Monocytes Absolute: 0.6 10*3/uL (ref 0.1–1.0)
Monocytes Relative: 11 %
Neutro Abs: 3.3 10*3/uL (ref 1.7–7.7)
Neutrophils Relative %: 62 %
Platelets: 339 10*3/uL (ref 150–400)
RBC: 3.32 MIL/uL — ABNORMAL LOW (ref 3.87–5.11)
RDW: 16.6 % — ABNORMAL HIGH (ref 11.5–15.5)
WBC: 5.3 10*3/uL (ref 4.0–10.5)
nRBC: 0 % (ref 0.0–0.2)

## 2020-12-16 LAB — GLUCOSE, CAPILLARY
Glucose-Capillary: 113 mg/dL — ABNORMAL HIGH (ref 70–99)
Glucose-Capillary: 95 mg/dL (ref 70–99)
Glucose-Capillary: 127 mg/dL — ABNORMAL HIGH (ref 70–99)
Glucose-Capillary: 102 mg/dL — ABNORMAL HIGH (ref 70–99)
Glucose-Capillary: 150 mg/dL — ABNORMAL HIGH (ref 70–99)

## 2020-12-16 NOTE — Progress Notes (Signed)
PROGRESS NOTE    Mandy [name]  DEY:[id number] DOB: 11-14-1940 DOA: 12/14/2020 PCP: [name], MD   Chief Complain: Shortness of breath  Brief Narrative:  Patient is a 80 year old female with history of diastolic congestive heart failure, hypertension, hyperlipidemia, pulmonary hypertension, recent ischemic stroke, diabetes type 2, chronic hypoxic respiratory failure who presents from home with complaints of dyspnea.  She was recently admitted here and was discharged on on 12/6 after she was managed for acute on chronic hypoxic respiratory failure from acute on chronic congestive heart failure/pulmonary hypertension and also right recurrent pleural effusion.  During that admission she was also found subacute stroke ,seen by neurology.  Patient is on 2 L of oxygen at home. Chest x-ray this admission showed pulmonary edema.  She was started on IV Lasix, placed on BiPAP.  Echocardiogram has been ordered.  Plan for ultrasound-guided thoracentesis of right side today.  Assessment & Plan:   Principal Problem:   CHF exacerbation ([place]) Active Problems:   Pressure injury of skin   Acute on chronic hypoxic respiratory failure: Multifactorial.  Secondary to diastolic congestive heart failure exacerbation, pulmonary hypertension. Recently admitted for the same and was discharged . On 2 L of oxygen at home.  Had to be put on 4 L of oxygen on  admission, put on BiPAP at night. Will continue to try to wean oxygen.  Continue BiPAP as needed at night. CXR showed features of pulmonary edema.  Elevated BNP.  CT chest showed collapse/consolidative changes in the right middle lobe and right lower lobes( same as previous imagings) and also showed  moderate right and small left pleural effusions.She has history of recurrent right-sided pleural effusion and underwent thoracentesis on last admission.  She had repeat thoracentesis on 12/13 with removal of 600 cc of fluid. She needs to follow-up with  pulmonology as an outpatient.  Acute on chronic diastolic congestive heart failure:Last echocardiogram showed normal systolic function, severe pulmonary hypertension.  Underwent left/right heart cath on 11/19 which did not show any coronary artery disease.  Cardiology recommended medical management.  Takes aspirin, atenolol, hydralazine, Lasix at home. Continue to monitor input/output.  Continue IV Lasix for now.  Echocardiogram repeated during this hospitalization that did not show any significant change.  Anticipate she could potentially transition to oral diuretics tomorrow if she continues to improve.  She is to follow-up with cardiology as an outpatient  History of recent CVA: Complains of intermittent dizziness.  No dizziness this morning.  On last admission, she was found to have subacute infarct in the right MCA territory.  CT head done on this admission showed the same, no new findings.  She was seen by neurology on last admission and has been recommended aspirin and Plavix for 3 weeks followed by Plavix alone.  Also recommended 30 day event  monitor/loop recorder as an outpatient  Chest pain: currently chest pain-free.  Elevated troponin.  This is most likely from demand ischemia from CHF.  Chest pain was reproducible.  Echo unremarkable.  Recent cath did not show any coronary artery disease  Diabetes type 2: Diet controlled. Last A1c of 5.7. Monitor blood sugars  Hypertension: Continue current medications.  Monitor blood pressure.  Continue current medications of her severe hypertension  Hyperlipidemia: On statin  CKD stage IIIa: Currently kidney function at baseline.  Monitor  Mild hyponatremia: Most likely from hypervolemia.  Improved with diuresis.  Continue diuresis, monitor electrolytes  Normocytic anemia: Hemoglobin has been stable since discharge.  No evidence of blood loss.Monitor  History of hypothyroidism: On methimazole  Morbid obesity: BMI of 33.7          DVT  prophylaxis:Heparin [place] Code Status: Full Family Communication: None at the bedside, tried to contact her daughter over the phone but unable to reach her Patient status: Inpatient  Dispo: The patient is from: Home              Anticipated d/c is to: Home              Anticipated d/c date is: In next1-2 days  Consultants: None  Procedures: 12/13 right thoracentesis with removal of 600 cc of fluid  Antimicrobials:  Anti-infectives (From admission, onward)    None       Subjective:  Patient says that her breathing is improving.  She denies any chest pain.   Objective: Vitals:   12/16/20 0422 12/16/20 0500 12/16/20 1229 12/16/20 1808  BP: (!) 147/48  (!) 114/53 133/69  Pulse: 68  72 78  Resp: 16  18   Temp: 98.3 F (36.8 C)  99.1 F (37.3 C) 98.3 F (36.8 C)  TempSrc: Oral  Oral Oral  SpO2: 95%  100% 97%  Weight:  75.7 kg    Height:        Intake/Output Summary (Last 24 hours) at 12/16/2020 2055 Last data filed at 12/16/2020 1254 Gross per 24 hour  Intake 1040 ml  Output 1200 ml  Net -160 ml   Filed Weights   12/14/20 1521 12/15/20 0500 12/16/20 0500  Weight: 77 kg 75.7 kg 75.7 kg    Examination:  General exam: Overall comfortable, not in distress, pleasant elderly female, obese HEENT: PERRL Respiratory system: Bibasilar crackles, decreased air entry in the right side Cardiovascular system: S1 & S2 heard, RRR.  Gastrointestinal system: Abdomen is nondistended, soft and nontender. Central nervous system: Alert and oriented Extremities: Trace bilateral lower extremity edema, no clubbing ,no cyanosis Skin: No rashes, no ulcers,no icterus      Data Reviewed: I have personally reviewed following labs and imaging studies  CBC: Recent Labs  Lab 12/14/20 1101 12/15/20 0311 12/16/20 0413  WBC 5.6 4.5 5.3  NEUTROABS 4.1  --  3.3  HGB 8.2* 7.6* 8.8*  HCT 26.0* 24.1* 27.8*  MCV 83.3 83.1 83.7  PLT 351 321 161   Basic Metabolic Panel: Recent Labs  Lab  12/14/20 1101 12/15/20 0311 12/16/20 0413  NA 130* 136 135  K 4.2 4.0 3.5  CL 91* 92* 91*  CO2 33* 37* 35*  GLUCOSE 161* 105* 106*  BUN 14 14 17   CREATININE 1.15* 1.20* 1.22*  CALCIUM 8.6* 8.6* 8.7*   GFR: Estimated Creatinine Clearance: 33.2 mL/min (A) (by C-G formula based on SCr of 1.22 mg/dL (H)). Liver Function Tests: Recent Labs  Lab 12/14/20 1101 12/15/20 0311  AST 19 15  ALT 10 9  ALKPHOS 37* 31*  BILITOT 0.6 0.5  PROT 6.9 6.3*  ALBUMIN 3.8 3.4*   No results for input(s): LIPASE, AMYLASE in the last 168 hours. No results for input(s): AMMONIA in the last 168 hours. Coagulation Profile: No results for input(s): INR, PROTIME in the last 168 hours. Cardiac Enzymes: No results for input(s): CKTOTAL, CKMB, CKMBINDEX, TROPONINI in the last 168 hours. BNP (last 3 results) No results for input(s): PROBNP in the last 8760 hours. HbA1C: No results for input(s): HGBA1C in the last 72 hours. CBG: Recent Labs  Lab 12/15/20 1946 12/16/20 0251 12/16/20 0749 12/16/20 1133 12/16/20 1643  GLUCAP 168* 113*  95 127* 102*   Lipid Profile: No results for input(s): CHOL, HDL, LDLCALC, TRIG, CHOLHDL, LDLDIRECT in the last 72 hours. Thyroid Function Tests: No results for input(s): TSH, T4TOTAL, FREET4, T3FREE, THYROIDAB in the last 72 hours. Anemia Panel: No results for input(s): VITAMINB12, FOLATE, FERRITIN, TIBC, IRON, RETICCTPCT in the last 72 hours. Sepsis Labs: No results for input(s): PROCALCITON, LATICACIDVEN in the last 168 hours.  Recent Results (from the past 240 hour(s))  Resp Panel by RT-PCR (Flu A&B, Covid) Nasopharyngeal Swab     Status: None   Collection Time: 12/14/20 11:34 AM   Specimen: Nasopharyngeal Swab; Nasopharyngeal(NP) swabs in vial transport medium  Result Value Ref Range Status   SARS Coronavirus 2 by RT PCR NEGATIVE NEGATIVE Final    Comment: (NOTE) SARS-CoV-2 target nucleic acids are NOT DETECTED.  The SARS-CoV-2 RNA is generally detectable  in upper respiratory specimens during the acute phase of infection. The lowest concentration of SARS-CoV-2 viral copies this assay can detect is 138 copies/mL. A negative result does not preclude SARS-Cov-2 infection and should not be used as the sole basis for treatment or other patient management decisions. A negative result may occur with  improper specimen collection/handling, submission of specimen other than nasopharyngeal swab, presence of viral mutation(s) within the areas targeted by this assay, and inadequate number of viral copies(<138 copies/mL). A negative result must be combined with clinical observations, patient history, and epidemiological information. The expected result is Negative.  Fact Sheet for Patients:  EntrepreneurPulse.com.au  Fact Sheet for Healthcare Providers:  IncredibleEmployment.be  This test is no t yet approved or cleared by the [place] FDA and  has been authorized for detection and/or diagnosis of SARS-CoV-2 by FDA under an Emergency Use Authorization (EUA). This EUA will remain  in effect (meaning this test can be used) for the duration of the COVID-19 declaration under Section 564(b)(1) of the Act, 21 U.S.C.section 360bbb-3(b)(1), unless the authorization is terminated  or revoked sooner.       Influenza A by PCR NEGATIVE NEGATIVE Final   Influenza B by PCR NEGATIVE NEGATIVE Final    Comment: (NOTE) The Xpert Xpress SARS-CoV-2/FLU/RSV plus assay is intended as an aid in the diagnosis of influenza from Nasopharyngeal swab specimens and should not be used as a sole basis for treatment. Nasal washings and aspirates are unacceptable for Xpert Xpress SARS-CoV-2/FLU/RSV testing.  Fact Sheet for Patients: EntrepreneurPulse.com.au  Fact Sheet for Healthcare Providers: IncredibleEmployment.be  This test is not yet approved or cleared by the [place] FDA and has  been authorized for detection and/or diagnosis of SARS-CoV-2 by FDA under an Emergency Use Authorization (EUA). This EUA will remain in effect (meaning this test can be used) for the duration of the COVID-19 declaration under Section 564(b)(1) of the Act, 21 U.S.C. section 360bbb-3(b)(1), unless the authorization is terminated or revoked.  Performed at [facility], [place] [address], [place], [place] [address]   MRSA Next Gen by PCR, Nasal     Status: None   Collection Time: 12/14/20  3:01 PM   Specimen: Nasal Mucosa; Nasal Swab  Result Value Ref Range Status   MRSA by PCR Next Gen NOT DETECTED NOT DETECTED Final    Comment: (NOTE) The GeneXpert MRSA Assay (FDA approved for NASAL specimens only), is one component of a comprehensive MRSA colonization surveillance program. It is not intended to diagnose MRSA infection nor to guide or monitor treatment for MRSA infections. Test performance is not FDA approved in patients less  than 1 years old. Performed at [facility], [place] [address] [place], [place] [address]          Radiology Studies: [facility] Chest Port 1 View  Result Date: 12/15/2020 CLINICAL DATA:  80 year old female status post thoracentesis. EXAM: PORTABLE CHEST 1 VIEW COMPARISON:  Chest x-ray 12/12/2020. FINDINGS: Previously noted right-sided pleural effusion is now small. No definite postprocedural pneumothorax. Persistent moderate left pleural effusion. Persistent bibasilar opacities which may reflect areas of atelectasis and/or consolidation. There is cephalization of the pulmonary vasculature and slight indistinctness of the interstitial markings suggestive of mild pulmonary edema. Mild cardiomegaly. Upper mediastinal contours are within normal limits. Atherosclerotic calcifications are noted in the thoracic aorta. IMPRESSION: 1. Decreasing size of what is now a small right pleural effusion following thoracentesis. No postprocedural  pneumothorax. 2. The appearance of the chest suggests congestive heart failure, as above. 3. Aortic atherosclerosis. Electronically Signed   By: Vinnie Langton M.D.   On: 12/15/2020 12:37   ECHOCARDIOGRAM COMPLETE  Result Date: 12/15/2020    ECHOCARDIOGRAM REPORT   Patient Name:   MYRON LONA Date of Exam: 12/15/2020 Medical Rec #:  [id number]      Height:       59.0 in Accession #:    [id number]     Weight:       166.9 lb Date of Birth:  [birth year]       BSA:          1.708 m Patient Age:    23 years       BP:           166/72 mmHg Patient Gender: F              HR:           67 bpm. Exam Location:  Inpatient Procedure: 2D Echo, Cardiac Doppler and Color Doppler Indications:    CHF  History:        Patient has prior history of Echocardiogram examinations, most                 recent 11/24/2020. CHF, Signs/Symptoms:Chest Pain; Risk                 Factors:Diabetes and Hypertension.  Sonographer:    Glo Herring Referring Phys: [id number] [place]  1. Left ventricular ejection fraction, by estimation, is 60 to 65%. The left ventricle has normal function. The left ventricle has no regional wall motion abnormalities. There is moderate concentric left ventricular hypertrophy. Left ventricular diastolic parameters are consistent with Grade II diastolic dysfunction (pseudonormalization). Elevated left atrial pressure. There is the interventricular septum is flattened in systole and diastole, consistent with right ventricular pressure and volume  overload.  2. Right ventricular systolic function is mildly reduced. The right ventricular size is normal. There is severely elevated pulmonary artery systolic pressure.  3. Left atrial size was moderately dilated.  4. Right atrial size was mildly dilated.  5. The mitral valve is normal in structure. Trivial mitral valve regurgitation. No evidence of mitral stenosis.  6. Tricuspid valve regurgitation is moderate.  7. The aortic valve is tricuspid. Aortic  valve regurgitation is not visualized. No aortic stenosis is present.  8. The inferior vena cava is dilated in size with <50% respiratory variability, suggesting right atrial pressure of 15 mmHg. Comparison(s): No significant change from prior study. Prior images reviewed side by side. FINDINGS  Left Ventricle: Left ventricular ejection fraction, by estimation, is 60 to 65%. The  left ventricle has normal function. The left ventricle has no regional wall motion abnormalities. The left ventricular internal cavity size was normal in size. There is  moderate concentric left ventricular hypertrophy. The interventricular septum is flattened in systole and diastole, consistent with right ventricular pressure and volume overload. Left ventricular diastolic parameters are consistent with Grade II diastolic dysfunction (pseudonormalization). Elevated left atrial pressure. Right Ventricle: The right ventricular size is normal. No increase in right ventricular wall thickness. Right ventricular systolic function is mildly reduced. There is severely elevated pulmonary artery systolic pressure. The tricuspid regurgitant velocity is 4.13 m/s, and with an assumed right atrial pressure of 15 mmHg, the estimated right ventricular systolic pressure is 46.2 mmHg. Left Atrium: Left atrial size was moderately dilated. Right Atrium: Right atrial size was mildly dilated. Pericardium: There is no evidence of pericardial effusion. Mitral Valve: The mitral valve is normal in structure. Trivial mitral valve regurgitation. No evidence of mitral valve stenosis. Tricuspid Valve: The tricuspid valve is normal in structure. Tricuspid valve regurgitation is moderate . No evidence of tricuspid stenosis. Aortic Valve: The aortic valve is tricuspid. Aortic valve regurgitation is not visualized. No aortic stenosis is present. Aortic valve mean gradient measures 6.0 mmHg. Aortic valve peak gradient measures 11.4 mmHg. Aortic valve area, by VTI measures  1.80  cm. Pulmonic Valve: The pulmonic valve was normal in structure. Pulmonic valve regurgitation is trivial. No evidence of pulmonic stenosis. Aorta: The aortic root is normal in size and structure. Venous: The inferior vena cava is dilated in size with less than 50% respiratory variability, suggesting right atrial pressure of 15 mmHg. IAS/Shunts: No atrial level shunt detected by color flow Doppler.  LEFT VENTRICLE PLAX 2D LVIDd:         4.25 cm   Diastology LVIDs:         2.95 cm   LV e' medial:    4.79 cm/s LV PW:         1.40 cm   LV E/e' medial:  18.2 LV IVS:        1.45 cm   LV e' lateral:   7.29 cm/s LVOT diam:     2.05 cm   LV E/e' lateral: 12.0 LV SV:         71 LV SV Index:   41 LVOT Area:     3.30 cm  RIGHT VENTRICLE            IVC RV Basal diam:  3.40 cm    IVC diam: 2.20 cm RV S prime:     9.79 cm/s LEFT ATRIUM              Index        RIGHT ATRIUM           Index LA diam:        3.70 cm  2.17 cm/m   RA Area:     19.40 cm LA Vol (A2C):   110.0 ml 64.41 ml/m  RA Volume:   49.80 ml  29.16 ml/m LA Vol (A4C):   94.2 ml  55.16 ml/m LA Biplane Vol: 107.0 ml 62.65 ml/m  AORTIC VALVE                     PULMONIC VALVE AV Area (Vmax):    1.76 cm      PV Vmax:       0.92 m/s AV Area (Vmean):   1.62 cm      PV Peak  grad:  3.4 mmHg AV Area (VTI):     1.80 cm AV Vmax:           169.00 cm/s AV Vmean:          121.000 cm/s AV VTI:            0.392 m AV Peak Grad:      11.4 mmHg AV Mean Grad:      6.0 mmHg LVOT Vmax:         90.10 cm/s LVOT Vmean:        59.500 cm/s LVOT VTI:          0.214 m LVOT/AV VTI ratio: 0.55  AORTA Ao Root diam: 2.80 cm Ao Asc diam:  3.10 cm MITRAL VALVE               TRICUSPID VALVE MV Area (PHT): 3.17 cm    TR Peak grad:   68.2 mmHg MV Decel Time: 239 msec    TR Vmax:        413.00 cm/s MV E velocity: 87.40 cm/s MV A velocity: 93.40 cm/s  SHUNTS MV E/A ratio:  0.94        Systemic VTI:  0.21 m                            Systemic Diam: 2.05 cm Mihai Croitoru MD Electronically  signed by Sanda Klein MD Signature Date/Time: 12/15/2020/2:10:11 PM    Final    US THORACENTESIS ASP PLEURAL SPACE W/IMG GUIDE  Result Date: 12/15/2020 INDICATION: Patient with history of CHF, chronic hypoxic respiratory failure and recurrent pleural effusion. Request is for therapeutic thoracentesis EXAM: ULTRASOUND GUIDED THERAPEUTIC RIGHT SIDED THORACENTESIS MEDICATIONS: Lidocaine 1 % - 10 ml COMPLICATIONS: None immediate. PROCEDURE: An ultrasound guided thoracentesis was thoroughly discussed with the patient and questions answered. The benefits, risks, alternatives and complications were also discussed. The patient understands and wishes to proceed with the procedure. Written consent was obtained. Ultrasound was performed to localize and mark an adequate pocket of fluid in the right chest. The area was then prepped and draped in the normal sterile fashion. 1% Lidocaine was used for local anesthesia. Under ultrasound guidance a 6 Fr Safe-T-Centesis catheter was introduced. Thoracentesis was performed. The catheter was removed and a dressing applied. The patient was unable to tolerate additional fluid removal. FINDINGS: A total of approximately 600 milliliters of straw colored fluid was removed. IMPRESSION: Successful ultrasound guided right sided therapeutics thoracentesis yielding 600 milliliters of pleural fluid. Read by Rushie Nyhan NP Electronically Signed   By: Markus Daft M.D.   On: 12/15/2020 16:51        Scheduled Meds:  aspirin EC  81 mg Oral Daily   atenolol  50 mg Oral BID   clopidogrel  75 mg Oral Daily   furosemide  40 mg Intravenous BID   gabapentin  100 mg Oral QHS   guaiFENesin  600 mg Oral BID   heparin  5,000 Units Subcutaneous Q8H   hydrALAZINE  50 mg Oral Q8H   mouth rinse  15 mL Mouth Rinse BID   melatonin  10 mg Oral QHS   methimazole  2.5 mg Oral Daily   pantoprazole  40 mg Oral q morning   simvastatin  40 mg Oral QHS   vitamin B-12  1,000 mcg Oral Daily    Continuous Infusions:  sodium chloride Stopped (12/14/20 1458)     LOS: 2 days  Time spent:35 mins,. More than 50% of that time was spent in counseling and/or coordination of care.      Kathie Dike, MD [facility] P12/14/2022, 8:55 PM

## 2020-12-16 NOTE — Evaluation (Addendum)
Physical Therapy Evaluation Patient Details Name: Mandy [name] MRN: [id number] DOB: 25-Sep-1940 Today's Date: 12/16/2020  History of Present Illness  Patient is a 80 year old female with history of diastolic congestive heart failure, hypertension, hyperlipidemia, pulmonary hypertension, recent ischemic stroke, diabetes type 2, chronic hypoxic respiratory failure who presents from home with complaints of dyspnea.  She was recently admitted here and was discharged on on 12/6 after she was managed for acute on chronic hypoxic respiratory failure from acute on chronic congestive heart failure/pulmonary hypertension and also right recurrent pleural effusion.  During that admission she was also found subacute stroke  Clinical Impression   The patient is resting in bed on  4  l [place], SPO2 96% .Patient able to follow simple gesture directions, no family present and interpreter not available. Patient  assisted to sitting with min assist. Stands with HHA Mod assist to attempt to step to recliner, had difficulty. RW provided and patient able to step to recliner and perform step inplace marching.  Spo2 dropped to 74% when 4 L taken off for pivot transfer. Replaced and recovered to 91% within 1 minute.   Patient perform ed seated L:E exercises, SPO2 remained >95% on 4 L.  Patient will return home with HHPT.  Pt admitted with above diagnosis.  Pt currently with functional limitations due to the deficits listed below (see PT Problem List). Pt will benefit from skilled PT to increase their independence and safety with mobility to allow discharge to the venue listed below.       Recommendations for follow up therapy are one component of a multi-disciplinary discharge planning process, led by the attending physician.  Recommendations may be updated based on patient status, additional functional criteria and insurance authorization.  Follow Up Recommendations Home health PT    Assistance Recommended at Discharge  Frequent or constant Supervision/Assistance  Functional Status Assessment Patient has had a recent decline in their functional status and demonstrates the ability to make significant improvements in function in a reasonable and predictable amount of time.  Equipment Recommendations       Recommendations for Other Services       Precautions / Restrictions Precautions Precaution Comments: home oxygen 2LO2 via [place]      Mobility  Bed Mobility   Bed Mobility: Supine to Sit     Supine to sit: Min assist     General bed mobility comments: light hand hold to pull up to sit    Transfers Overall transfer level: Needs assistance Equipment used: Rolling walker (2 wheels) Transfers: Sit to/from Stand;Bed to chair/wheelchair/BSC Sit to Stand: Min assist   Step pivot transfers: Min assist       General transfer comment: cues to stnad, attempted with HHA, decreased  ability to take a step, RW brought up and able to support better to step to recliner, stepped in place x 10. Seated rest and stood again. Stated " Tired"    Ambulation/Gait                  Stairs            Wheelchair Mobility    Modified Rankin (Stroke Patients Only)       Balance Overall balance assessment: Needs assistance Sitting-balance support: Feet supported Sitting balance-Leahy Scale: Good     Standing balance support: Bilateral upper extremity supported;During functional activity;Reliant on assistive device for balance Standing balance-Leahy Scale: Poor  Pertinent Vitals/Pain Faces Pain Scale: No hurt    Home Living Family/patient expects to be discharged to:: Private residence Living Arrangements: Children Available Help at Discharge: Family;Available 24 hours/day Type of Home: House Home Access: Level entry       Home Layout: One level Home Equipment: Conservation officer, nature (2 wheels) Additional Comments: no family present    Prior  Function Prior Level of Function : Needs assist             Mobility Comments: family assists with mobility and ADL's       Hand Dominance   Dominant Hand: Right    Extremity/Trunk Assessment   Upper Extremity Assessment Upper Extremity Assessment: Overall WFL for tasks assessed;LUE deficits/detail LUE Deficits / Details: crepitus heard, Patient  stated" not good" and rubbed left shoulder    Lower Extremity Assessment Lower Extremity Assessment: Generalized weakness    Cervical / Trunk Assessment Cervical / Trunk Assessment: Kyphotic  Communication   Communication: Prefers language other than English  Cognition Arousal/Alertness: Awake/alert Behavior During Therapy: WFL for tasks assessed/performed Overall Cognitive Status: Difficult to assess                                 General Comments: pleasant, responds with basic [place] greetings. Follows gestures well        General Comments      Exercises General Exercises - Lower Extremity Long Arc Quad: AROM;Both;10 reps;Seated Hip Flexion/Marching: AROM;Both;10 reps;Seated   Assessment/Plan    PT Assessment Patient needs continued PT services  PT Problem List Decreased strength;Obesity;Decreased activity tolerance;Decreased balance;Decreased mobility;Cardiopulmonary status limiting activity;Decreased coordination       PT Treatment Interventions DME instruction;Balance training;Gait training;Stair training;Functional mobility training;Patient/family education;Therapeutic activities;Therapeutic exercise    PT Goals (Current goals can be found in the Care Plan section)  Acute Rehab PT Goals Patient Stated Goal: agreed to  recliner PT Goal Formulation: With patient Time For Goal Achievement: 12/30/20 Potential to Achieve Goals: Good    Frequency Min 3X/week   Barriers to discharge        Co-evaluation               AM-PAC PT "6 Clicks" Mobility  Outcome Measure Help needed  turning from your back to your side while in a flat bed without using bedrails?: A Little Help needed moving from lying on your back to sitting on the side of a flat bed without using bedrails?: A Little Help needed moving to and from a bed to a chair (including a wheelchair)?: A Lot Help needed standing up from a chair using your arms (e.g., wheelchair or bedside chair)?: A Lot Help needed to walk in hospital room?: Total Help needed climbing 3-5 steps with a railing? : Total 6 Click Score: 12    End of Session Equipment Utilized During Treatment: Oxygen Activity Tolerance: Patient limited by fatigue Patient left: in chair;with call bell/phone within reach;with chair alarm set;with nursing/sitter in room Nurse Communication: Mobility status PT Visit Diagnosis: Difficulty in walking, not elsewhere classified (R26.2);Muscle weakness (generalized) (M62.81)    Time: 2376-2831 PT Time Calculation (min) (ACUTE ONLY): 20 min   Charges:     PT Treatments Low eval 8-22        [place] Pager [phone] Office [phone]   Claretha Cooper 12/16/2020, 12:16 PM

## 2020-12-17 LAB — BASIC METABOLIC PANEL
Anion gap: 8 (ref 5–15)
BUN: 18 mg/dL (ref 8–23)
CO2: 34 mmol/L — ABNORMAL HIGH (ref 22–32)
Calcium: 8.4 mg/dL — ABNORMAL LOW (ref 8.9–10.3)
Chloride: 91 mmol/L — ABNORMAL LOW (ref 98–111)
Creatinine, Ser: 1.51 mg/dL — ABNORMAL HIGH (ref 0.44–1.00)
GFR, Estimated: 35 mL/min — ABNORMAL LOW (ref >60)
Glucose, Bld: 123 mg/dL — ABNORMAL HIGH (ref 70–99)
Potassium: 3.8 mmol/L (ref 3.5–5.1)
Sodium: 133 mmol/L — ABNORMAL LOW (ref 135–145)

## 2020-12-17 LAB — GLUCOSE, CAPILLARY
Glucose-Capillary: 128 mg/dL — ABNORMAL HIGH (ref 70–99)
Glucose-Capillary: 105 mg/dL — ABNORMAL HIGH (ref 70–99)
Glucose-Capillary: 151 mg/dL — ABNORMAL HIGH (ref 70–99)
Glucose-Capillary: 94 mg/dL (ref 70–99)
Glucose-Capillary: 134 mg/dL — ABNORMAL HIGH (ref 70–99)

## 2020-12-17 MED ORDER — POLYETHYLENE GLYCOL 3350 17 G PO PACK
17.0000 g | PACK | Freq: Every day | ORAL | Status: DC
  Administered 2020-12-17 – 2020-12-21 (×4): 17 g via ORAL
  Filled 2020-12-17 (×4): qty 1

## 2020-12-17 MED ORDER — TORSEMIDE 20 MG PO TABS
40.0000 mg | ORAL_TABLET | Freq: Every day | ORAL | Status: DC
  Administered 2020-12-18 – 2020-12-21 (×4): 40 mg via ORAL
  Filled 2020-12-17 (×4): qty 2

## 2020-12-17 NOTE — Care Management Important Message (Signed)
Important Message  Patient Details IM Letter placed in room for daughter Mandy [name]. Name: Mandy [name] MRN: [id number] Date of Birth: 04-Nov-1940   Medicare Important Message Given:  Yes     Kerin Salen 12/17/2020, 11:21 AM

## 2020-12-17 NOTE — Progress Notes (Signed)
Patient's oxygen turned down from 4 L/min to 2 L/min.  Resting and standing SpO2 100%.  Patient walked to bathroom with oxygen and walker, SpO2 stayed above 94%.  Patient reported feeling tired, but was not SOB.  Angie Fava, RN

## 2020-12-17 NOTE — Progress Notes (Signed)
Placed patient on bipap for the night with IPAP set at 12cm and EPAP set at 6cm and fio2 at 28%.

## 2020-12-17 NOTE — Progress Notes (Signed)
On several occasions yesterday, last night, and this morning, pt called out to c/o of epigastric pain.  When staff entered room, pt had removed nasal cannula. SpO2 readings as low as 57%.  Within 1 minute of replacing nasal cannula on 4 L/min, pt claimed pain had gone away, and SpO2 returned to mid 90s.  MD aware.  Angie Fava, RN

## 2020-12-17 NOTE — Progress Notes (Addendum)
PROGRESS NOTE    Girtha Kilgore  WGY:[id number] DOB: 08-Jan-1940 DOA: 12/14/2020 PCP: [name], MD    Brief Narrative:  Mrs. Mago was admitted to the hospital with the working diagnosis of acute on chronic hypoxic respiratory failure due to diastolic heart failure.  80 year old female with past medical history for diastolic heart failure, pulmonary hypertension, hypertension, dyslipidemia, CVA, type 2 diabetes mellitus who presented with dyspnea.  Recent hospitalization 11/21-12/06/2020 for acute hypercapnic and hypoxic respiratory failure, recurrent pleural effusion, diastolic heart failure exacerbation.  Reported about 48 hours of worsening dyspnea, associated with sharp left chest pain worse with coughing.  Her symptoms were refractory to bronchodilator therapy and supplemental oxygen. Her blood pressure was 178/77, heart rate 68, respirate 20, oxygen saturation 99%, on BiPAP, her lungs had decreased breath sounds bilaterally, she had increased work of breathing, heart S1-S2, present, rhythmic, abdomen soft, no lower extremity edema.  Sodium 130, potassium 4.2, chloride 91, bicarb 33, glucose 161, BUN 14, creatinine 1.15. White count 5.6, hemoglobin 8.2, hematocrit 26.0, platelets 351 SARS COVID-19 negative.  Chest radiograph with bilateral interstitial markings, hilar vascular congestion. Chest CT with bilateral groundglass opacities, bilateral pleural effusions more right than left.  EKG 69 bpm, normal axis, normal intervals, sinus rhythm, no significant ST segment or T wave changes.  Patient was placed on intravenous furosemide with improvement of volume status. She was liberated from noninvasive mechanical ventilation. 12/13 ultrasound-guided thoracentesis 600 mL of straw-colored fluid.  Assessment & Plan:   Principal Problem:   CHF exacerbation ([place]) Active Problems:   Pressure injury of skin   Acute on chronic diastolic heart failure. Patient slowly feeling better but  not yet back to her baseline. Not documented urine output. Oxymetry is 99% on 1 L/min per supplemental 02 per [place] Elevated troponin due to demand ischemia, no ACS  Plan to continue diuresis with oral torsemide, at home patient taking oral furosemide 60 mg.  Blood pressure control with hydralazine. Continue B blocker with atenolol.   2. Hx of CVA. Continue antiplatelet therapy (asa and clopidogrel) and statin, out of bed to chair tid with meals. Continue with physical therapy.   3. T2DM with dyslipidemia. Continue glucose cover and monitoring with insulin sliding scale.  Diabetic neuropathy, continue with gabapentin.  Continue with simvastatin   4. Hyperthyroidism. Continue with methimazole   5. Hyponatremia. Stage 3a CKD   6. Obesity class 1. Calculated BMI is 33,7  7. Anxiety. Continue with as needed diazepam.     Status is: Inpatient  Remains inpatient appropriate because:  diuresis    DVT prophylaxis: Enoxaparin   Code Status:   full  Family Communication:  I spoke with patient's daughter at the bedside, we talked in detail about patient's condition, plan of care and prognosis and all questions were addressed.      Nutrition Status:           Skin Documentation: Pressure Injury 12/14/20 Labia Right Stage 1 -  Intact skin with non-blanchable redness of a localized area usually over a bony prominence. Healing, pink (Active)  12/14/20 1500  Location: Labia  Location Orientation: Right  Staging: Stage 1 -  Intact skin with non-blanchable redness of a localized area usually over a bony prominence.  Wound Description (Comments): Healing, pink  Present on Admission: Yes       Subjective: Patient slowly feeling better, no nausea or vomiting, no chest pain, dyspnea continue improving but not back to baseline,   Objective: Vitals:   12/17/20 0000 12/17/20  0234 12/17/20 0400 12/17/20 1310  BP:    (!) 121/51  Pulse:    72  Resp: 19  20 16   Temp:    98.8 F (37.1  C)  TempSrc:    Oral  SpO2:    95%  Weight:  75.5 kg    Height:        Intake/Output Summary (Last 24 hours) at 12/17/2020 1733 Last data filed at 12/17/2020 0534 Gross per 24 hour  Intake 360 ml  Output 600 ml  Net -240 ml   Filed Weights   12/15/20 0500 12/16/20 0500 12/17/20 0234  Weight: 75.7 kg 75.7 kg 75.5 kg    Examination:   General: Not in pain or dyspnea  Neurology: Awake and alert, non focal  E ENT: mild pallor, no icterus, oral mucosa moist Cardiovascular: No JVD. S1-S2 present, rhythmic, no gallops, rubs, or murmurs. No lower extremity edema. Pulmonary: positive breath sounds bilaterally with no wheezing, rhonchi or rales. Gastrointestinal. Abdomen soft and non tender Skin. No rashes Musculoskeletal: no joint deformities     Data Reviewed: I have personally reviewed following labs and imaging studies  CBC: Recent Labs  Lab 12/14/20 1101 12/15/20 0311 12/16/20 0413  WBC 5.6 4.5 5.3  NEUTROABS 4.1  --  3.3  HGB 8.2* 7.6* 8.8*  HCT 26.0* 24.1* 27.8*  MCV 83.3 83.1 83.7  PLT 351 321 814   Basic Metabolic Panel: Recent Labs  Lab 12/14/20 1101 12/15/20 0311 12/16/20 0413 12/17/20 0413  NA 130* 136 135 133*  K 4.2 4.0 3.5 3.8  CL 91* 92* 91* 91*  CO2 33* 37* 35* 34*  GLUCOSE 161* 105* 106* 123*  BUN 14 14 17 18   CREATININE 1.15* 1.20* 1.22* 1.51*  CALCIUM 8.6* 8.6* 8.7* 8.4*   GFR: Estimated Creatinine Clearance: 26.8 mL/min (A) (by C-G formula based on SCr of 1.51 mg/dL (H)). Liver Function Tests: Recent Labs  Lab 12/14/20 1101 12/15/20 0311  AST 19 15  ALT 10 9  ALKPHOS 37* 31*  BILITOT 0.6 0.5  PROT 6.9 6.3*  ALBUMIN 3.8 3.4*   No results for input(s): LIPASE, AMYLASE in the last 168 hours. No results for input(s): AMMONIA in the last 168 hours. Coagulation Profile: No results for input(s): INR, PROTIME in the last 168 hours. Cardiac Enzymes: No results for input(s): CKTOTAL, CKMB, CKMBINDEX, TROPONINI in the last 168  hours. BNP (last 3 results) No results for input(s): PROBNP in the last 8760 hours. HbA1C: No results for input(s): HGBA1C in the last 72 hours. CBG: Recent Labs  Lab 12/16/20 2121 12/17/20 0338 12/17/20 0753 12/17/20 1136 12/17/20 1638  GLUCAP 150* 128* 105* 151* 94   Lipid Profile: No results for input(s): CHOL, HDL, LDLCALC, TRIG, CHOLHDL, LDLDIRECT in the last 72 hours. Thyroid Function Tests: No results for input(s): TSH, T4TOTAL, FREET4, T3FREE, THYROIDAB in the last 72 hours. Anemia Panel: No results for input(s): VITAMINB12, FOLATE, FERRITIN, TIBC, IRON, RETICCTPCT in the last 72 hours.    Radiology Studies: I have reviewed all of the imaging during this hospital visit personally     Scheduled Meds:  aspirin EC  81 mg Oral Daily   atenolol  50 mg Oral BID   clopidogrel  75 mg Oral Daily   furosemide  40 mg Intravenous BID   gabapentin  100 mg Oral QHS   guaiFENesin  600 mg Oral BID   heparin  5,000 Units Subcutaneous Q8H   hydrALAZINE  50 mg Oral Q8H   mouth  rinse  15 mL Mouth Rinse BID   melatonin  10 mg Oral QHS   methimazole  2.5 mg Oral Daily   pantoprazole  40 mg Oral q morning   polyethylene glycol  17 g Oral Daily   simvastatin  40 mg Oral QHS   vitamin B-12  1,000 mcg Oral Daily   Continuous Infusions:  sodium chloride Stopped (12/14/20 1458)     LOS: 3 days        H[place]ley Demedeiros Gerome Apley, MD

## 2020-12-18 DIAGNOSIS — E0590 Thyrotoxicosis, unspecified without thyrotoxic crisis or storm: Secondary | ICD-10-CM

## 2020-12-18 DIAGNOSIS — E118 Type 2 diabetes mellitus with unspecified complications: Secondary | ICD-10-CM

## 2020-12-18 DIAGNOSIS — I700 Atherosclerosis of aorta: Secondary | ICD-10-CM

## 2020-12-18 DIAGNOSIS — L8991 Pressure ulcer of unspecified site, stage 1: Secondary | ICD-10-CM

## 2020-12-18 DIAGNOSIS — I6330 Cerebral infarction due to thrombosis of unspecified cerebral artery: Secondary | ICD-10-CM

## 2020-12-18 LAB — BASIC METABOLIC PANEL
Anion gap: 8 (ref 5–15)
BUN: 19 mg/dL (ref 8–23)
CO2: 35 mmol/L — ABNORMAL HIGH (ref 22–32)
Calcium: 8.8 mg/dL — ABNORMAL LOW (ref 8.9–10.3)
Chloride: 91 mmol/L — ABNORMAL LOW (ref 98–111)
Creatinine, Ser: 1.28 mg/dL — ABNORMAL HIGH (ref 0.44–1.00)
GFR, Estimated: 43 mL/min — ABNORMAL LOW (ref >60)
Glucose, Bld: 128 mg/dL — ABNORMAL HIGH (ref 70–99)
Potassium: 4.5 mmol/L (ref 3.5–5.1)
Sodium: 134 mmol/L — ABNORMAL LOW (ref 135–145)

## 2020-12-18 LAB — GLUCOSE, CAPILLARY
Glucose-Capillary: 100 mg/dL — ABNORMAL HIGH (ref 70–99)
Glucose-Capillary: 99 mg/dL (ref 70–99)
Glucose-Capillary: 138 mg/dL — ABNORMAL HIGH (ref 70–99)
Glucose-Capillary: 91 mg/dL (ref 70–99)
Glucose-Capillary: 145 mg/dL — ABNORMAL HIGH (ref 70–99)

## 2020-12-18 NOTE — Plan of Care (Signed)
  Problem: Activity: Goal: Risk for activity intolerance will decrease Outcome: Progressing   Problem: Nutrition: Goal: Adequate nutrition will be maintained Outcome: Progressing   

## 2020-12-18 NOTE — Progress Notes (Signed)
PROGRESS NOTE    Mandy [name]  OYD:[id number] DOB: 04/30/1940 DOA: 12/14/2020 PCP: [name], MD    Brief Narrative:  Mandy [name] was admitted to the hospital with the working diagnosis of acute on chronic hypoxic respiratory failure due to diastolic heart failure.   80 year old female with past medical history for diastolic heart failure, pulmonary hypertension, hypertension, dyslipidemia, CVA, type 2 diabetes mellitus who presented with dyspnea.  Recent hospitalization 11/21-12/06/2020 for acute hypercapnic and hypoxic respiratory failure, recurrent pleural effusion, diastolic heart failure exacerbation.  Reported about 48 hours of worsening dyspnea, associated with sharp left chest pain worse with coughing.  Her symptoms were refractory to bronchodilator therapy and supplemental oxygen. Her blood pressure was 178/77, heart rate 68, respirate 20, oxygen saturation 99%, on BiPAP, her lungs had decreased breath sounds bilaterally, she had increased work of breathing, heart S1-S2, present, rhythmic, abdomen soft, no lower extremity edema.   Sodium 130, potassium 4.2, chloride 91, bicarb 33, glucose 161, BUN 14, creatinine 1.15. White count 5.6, hemoglobin 8.2, hematocrit 26.0, platelets 351 SARS COVID-19 negative.   Chest radiograph with bilateral interstitial markings, hilar vascular congestion. Chest CT with bilateral groundglass opacities, bilateral pleural effusions more right than left.   EKG 69 bpm, normal axis, normal intervals, sinus rhythm, no significant ST segment or T wave changes.   Patient was placed on intravenous furosemide with improvement of volume status. She was liberated from noninvasive mechanical ventilation. 12/13 ultrasound-guided thoracentesis 600 mL of straw-colored fluid.   Assessment & Plan:   Principal Problem:   CHF exacerbation ([place]) Active Problems:   Diabetes mellitus type 2, controlled ([place])   Dyslipidemia   Essential hypertension   Aortic  atherosclerosis ([place])   Hyperthyroidism   Cerebral thrombosis with cerebral infarction   Pressure injury of skin   Acute on chronic diastolic heart failure/ pulmonary HTN, acute on chronic core pulmonale.  Elevated troponin due to demand ischemia, no ACS   Echocardiogram with preserved LV systolic function with EF 60 to 65%. RV with with mild reduction in the systolic function, severe elevation in systolic pulmonary pressure, RV with pressure and volume overload. Left atrium with moderate dilatation, moderate TR.   Patient has been dizzy this am with ambulation, more than her baseline. Clinically improved volume status.  Plan to continue atenolol and hydralazine. Diuresis with torsemide    2. Hx of CVA. Continue antiplatelet therapy (asa and clopidogrel) and statin, out of bed to chair tid with meals. Continue with physical therapy.    She walks with the help of a walker at home, limited mobility due to left shoulder pain.   3. T2DM with dyslipidemia.  Capillary glucose is 138. Continue insulin sliding scale for glucose cover and monitoring Patient is tolerating po well.    4. Hyperthyroidism. On methimazole    5. Hyponatremia. AKI on Stage 3a CKD   Follow up on renal function today and in am. Continue diuresis with torsemide to keep negative fluid balance.  Serum cr yesterday up to 1,51 and Na 133.   6. 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 Calculated BMI is 33,7. Continue to encourage mobility  Continue local skin care.  7. Anxiety. On PRN diazepam.    Status is: Inpatient   DVT prophylaxis: Enoxaparin   Code Status:    full  Family Communication:   I spoke with patient's daughter at the bedside, we talked in detail about patient's condition, plan of care and prognosis and all questions  were addressed.        Skin Documentation: Pressure Injury 12/14/20 Labia Right Stage 1 -  Intact skin with non-blanchable redness of a  localized area usually over a bony prominence. Healing, pink (Active)  12/14/20 1500  Location: Labia  Location Orientation: Right  Staging: Stage 1 -  Intact skin with non-blanchable redness of a localized area usually over a bony prominence.  Wound Description (Comments): Healing, pink  Present on Admission: Yes     Subjective: Patient with dizziness today, more than her baseline, worse with movement, no chest pain or dyspnea, tolerating po well.   Objective: Vitals:   12/17/20 1948 12/17/20 2309 12/18/20 0412 12/18/20 1010  BP: (!) 125/53  (!) 137/55 (!) 131/59  Pulse: 77 85 71 70  Resp:  (!) 25    Temp: 98.2 F (36.8 C)  98.7 F (37.1 C)   TempSrc: Oral  Oral   SpO2: 98% 97% 100%   Weight:      Height:        Intake/Output Summary (Last 24 hours) at 12/18/2020 1239 Last data filed at 12/18/2020 0300 Gross per 24 hour  Intake 200 ml  Output 500 ml  Net -300 ml   Filed Weights   12/15/20 0500 12/16/20 0500 12/17/20 0234  Weight: 75.7 kg 75.7 kg 75.5 kg    Examination:   General: Not in pain or dyspnea, deconditioned  Neurology: Awake and alert, non focal  E ENT: mild pallor, no icterus, oral mucosa moist Cardiovascular: No JVD. S1-S2 present, rhythmic, no gallops, rubs, or murmurs. trace lower extremity edema. Pulmonary: positive breath sounds bilaterally, with no wheezing, rhonchi or rales. Gastrointestinal. Abdomen soft and non tender Skin. No rashes Musculoskeletal: decreased mobility left upper extremity due to pain at the left shoulder.      Data Reviewed: I have personally reviewed following labs and imaging studies  CBC: Recent Labs  Lab 12/14/20 1101 12/15/20 0311 12/16/20 0413  WBC 5.6 4.5 5.3  NEUTROABS 4.1  --  3.3  HGB 8.2* 7.6* 8.8*  HCT 26.0* 24.1* 27.8*  MCV 83.3 83.1 83.7  PLT 351 321 812   Basic Metabolic Panel: Recent Labs  Lab 12/14/20 1101 12/15/20 0311 12/16/20 0413 12/17/20 0413  NA 130* 136 135 133*  K 4.2 4.0 3.5  3.8  CL 91* 92* 91* 91*  CO2 33* 37* 35* 34*  GLUCOSE 161* 105* 106* 123*  BUN 14 14 17 18   CREATININE 1.15* 1.20* 1.22* 1.51*  CALCIUM 8.6* 8.6* 8.7* 8.4*   GFR: Estimated Creatinine Clearance: 26.8 mL/min (A) (by C-G formula based on SCr of 1.51 mg/dL (H)). Liver Function Tests: Recent Labs  Lab 12/14/20 1101 12/15/20 0311  AST 19 15  ALT 10 9  ALKPHOS 37* 31*  BILITOT 0.6 0.5  PROT 6.9 6.3*  ALBUMIN 3.8 3.4*   No results for input(s): LIPASE, AMYLASE in the last 168 hours. No results for input(s): AMMONIA in the last 168 hours. Coagulation Profile: No results for input(s): INR, PROTIME in the last 168 hours. Cardiac Enzymes: No results for input(s): CKTOTAL, CKMB, CKMBINDEX, TROPONINI in the last 168 hours. BNP (last 3 results) No results for input(s): PROBNP in the last 8760 hours. HbA1C: No results for input(s): HGBA1C in the last 72 hours. CBG: Recent Labs  Lab 12/17/20 1638 12/17/20 1956 12/18/20 0604 12/18/20 0741 12/18/20 1214  GLUCAP 94 134* 100* 99 138*   Lipid Profile: No results for input(s): CHOL, HDL, LDLCALC, TRIG, CHOLHDL, LDLDIRECT in the  last 72 hours. Thyroid Function Tests: No results for input(s): TSH, T4TOTAL, FREET4, T3FREE, THYROIDAB in the last 72 hours. Anemia Panel: No results for input(s): VITAMINB12, FOLATE, FERRITIN, TIBC, IRON, RETICCTPCT in the last 72 hours.    Radiology Studies: I have reviewed all of the imaging during this hospital visit personally     Scheduled Meds:  aspirin EC  81 mg Oral Daily   atenolol  50 mg Oral BID   clopidogrel  75 mg Oral Daily   gabapentin  100 mg Oral QHS   guaiFENesin  600 mg Oral BID   heparin  5,000 Units Subcutaneous Q8H   hydrALAZINE  50 mg Oral Q8H   mouth rinse  15 mL Mouth Rinse BID   melatonin  10 mg Oral QHS   methimazole  2.5 mg Oral Daily   pantoprazole  40 mg Oral q morning   polyethylene glycol  17 g Oral Daily   simvastatin  40 mg Oral QHS   torsemide  40 mg Oral  Daily   vitamin B-12  1,000 mcg Oral Daily   Continuous Infusions:  sodium chloride Stopped (12/14/20 1458)     LOS: 4 days        Marshal Eskew Gerome Apley, MD

## 2020-12-18 NOTE — Progress Notes (Signed)
Physical Therapy Treatment Patient Details Name: Mandy [name] MRN: [id number] DOB: 1940/11/28 Today's Date: 12/18/2020   History of Present Illness Patient is a 80 year old female with history of diastolic congestive heart failure, hypertension, hyperlipidemia, pulmonary hypertension, recent ischemic stroke, diabetes type 2, chronic hypoxic respiratory failure who presents from home with complaints of dyspnea.  She was recently admitted here and was discharged on on 12/6 after she was managed for acute on chronic hypoxic respiratory failure from acute on chronic congestive heart failure/pulmonary hypertension and also right recurrent pleural effusion.  During that admission she was also found subacute stroke    PT Comments    Pt assisted with ambulating and tolerated short distance.  Pt requiring supplemental oxygen and reports dyspnea (also observed increased work of breathing) with exertional tasks.  SATURATION QUALIFICATIONS: (This note is used to comply with regulatory documentation for home oxygen)  Patient Saturations on Room Air at Rest = 86%  Patient Saturations on Room Air while Ambulating = n/a  Patient Saturations on 2 Liters of oxygen while Ambulating = 92%  Please briefly explain why patient needs home oxygen: to improve oxygen saturations at rest and during physical activities such as ADLs and ambulation.   Recommendations for follow up therapy are one component of a multi-disciplinary discharge planning process, led by the attending physician.  Recommendations may be updated based on patient status, additional functional criteria and insurance authorization.  Follow Up Recommendations  Home health PT     Assistance Recommended at Discharge Frequent or constant Supervision/Assistance  Equipment Recommendations  None recommended by PT    Recommendations for Other Services       Precautions / Restrictions Precautions Precautions: Fall Precaution Comments: home  oxygen 2LO2 via [place]     Mobility  Bed Mobility Overal bed mobility: Needs Assistance Bed Mobility: Sit to Supine       Sit to supine: Min guard   General bed mobility comments: increased time and effort    Transfers Overall transfer level: Needs assistance Equipment used: Rolling walker (2 wheels) Transfers: Sit to/from Stand Sit to Stand: Min guard Stand pivot transfers: Min guard         General transfer comment: cues for hand placement    Ambulation/Gait Ambulation/Gait assistance: Min guard Gait Distance (Feet): 60 Feet Assistive device: Rolling walker (2 wheels) Gait Pattern/deviations: Step-through pattern;Decreased stride length Gait velocity: decreased     General Gait Details: pt with increased work of breathing and required standing rest breaks, SPO2 92% on 2L O2 [place]   Stairs             Wheelchair Mobility    Modified Rankin (Stroke Patients Only)       Balance                                            Cognition Arousal/Alertness: Awake/alert Behavior During Therapy: WFL for tasks assessed/performed Overall Cognitive Status: Difficult to assess                                 General Comments: pleasant, responds with basic [place] greetings. Follows gestures well        Exercises      General Comments        Pertinent Vitals/Pain Pain Assessment: No/denies pain Pain Intervention(s): Monitored during session;Repositioned  Home Living                          Prior Function            PT Goals (current goals can now be found in the care plan section) Progress towards PT goals: Progressing toward goals    Frequency    Min 3X/week      PT Plan Current plan remains appropriate    Co-evaluation              AM-PAC PT "6 Clicks" Mobility   Outcome Measure  Help needed turning from your back to your side while in a flat bed without using bedrails?: A Little Help  needed moving from lying on your back to sitting on the side of a flat bed without using bedrails?: A Little Help needed moving to and from a bed to a chair (including a wheelchair)?: A Little Help needed standing up from a chair using your arms (e.g., wheelchair or bedside chair)?: A Little Help needed to walk in hospital room?: A Lot Help needed climbing 3-5 steps with a railing? : A Lot 6 Click Score: 16    End of Session Equipment Utilized During Treatment: Gait belt;Oxygen Activity Tolerance: Patient limited by fatigue Patient left: in bed;with call bell/phone within reach;with bed alarm set Nurse Communication: Mobility status PT Visit Diagnosis: Difficulty in walking, not elsewhere classified (R26.2);Muscle weakness (generalized) (M62.81)     Time: 9643-8381 PT Time Calculation (min) (ACUTE ONLY): 28 min  Charges:  $Gait Training: 23-37 mins                    Jannette Spanner PT, DPT Acute Rehabilitation Services Pager: [phone] Office: [place] 12/18/2020, 1:34 PM

## 2020-12-18 NOTE — Progress Notes (Signed)
Pt RR 31 and using accessory muscles to breath with [place] under her chin. O2 saturation checked 91%. Placed Mandy [name] on correctly, O2 increased to 100%. Pt complained of mid-sternal achy chest pain. Notified Dr. Cathlean Sauer and obtained EKG. EKG placed in chart. Pt's RR decreased to 18 and decreased use of accessory muscles.  Mandy [name]

## 2020-12-19 LAB — GLUCOSE, CAPILLARY
Glucose-Capillary: 104 mg/dL — ABNORMAL HIGH (ref 70–99)
Glucose-Capillary: 116 mg/dL — ABNORMAL HIGH (ref 70–99)
Glucose-Capillary: 96 mg/dL (ref 70–99)
Glucose-Capillary: 113 mg/dL — ABNORMAL HIGH (ref 70–99)
Glucose-Capillary: 119 mg/dL — ABNORMAL HIGH (ref 70–99)

## 2020-12-19 MED ORDER — ALUM & MAG HYDROXIDE-SIMETH 200-200-20 MG/5ML PO SUSP
30.0000 mL | ORAL | Status: DC | PRN
  Administered 2020-12-19: 30 mL via ORAL
  Filled 2020-12-19: qty 30

## 2020-12-19 MED ORDER — SUCRALFATE 1 GM/10ML PO SUSP
1.0000 g | Freq: Three times a day (TID) | ORAL | Status: DC
  Administered 2020-12-19 – 2020-12-21 (×10): 1 g via ORAL
  Filled 2020-12-19 (×9): qty 10

## 2020-12-19 NOTE — Plan of Care (Signed)
  Problem: Activity: Goal: Risk for activity intolerance will decrease Outcome: Progressing   Problem: Coping: Goal: Level of anxiety will decrease Outcome: Progressing   Problem: Elimination: Goal: Will not experience complications related to bowel motility Outcome: Progressing   

## 2020-12-19 NOTE — Progress Notes (Signed)
PROGRESS NOTE    Mandy Mandy [name]  HUT:[id number] DOB: October 24, 1940 DOA: 12/14/2020 PCP: Mandy Ebbs, MD    Brief Narrative:  Mandy Mandy [name] was admitted to the hospital with the working diagnosis of acute on chronic hypoxic respiratory failure due to diastolic heart failure.   80 year old Mandy [name] with past medical history for diastolic heart failure, pulmonary hypertension, hypertension, dyslipidemia, CVA, type 2 diabetes mellitus who presented with dyspnea.  Recent hospitalization 11/21-12/06/2020 for acute hypercapnic and hypoxic respiratory failure, recurrent pleural effusion, diastolic heart failure exacerbation.  Reported about 48 hours of worsening dyspnea, associated with sharp left chest pain worse with coughing.  Her symptoms were refractory to bronchodilator therapy and supplemental oxygen. Her blood pressure was 178/77, heart rate 68, respirate 20, oxygen saturation 99%, on BiPAP, her lungs had decreased breath sounds bilaterally, she had increased work of breathing, heart S1-S2, present, rhythmic, abdomen soft, no lower extremity edema.   Sodium 130, potassium 4.2, chloride 91, bicarb 33, glucose 161, BUN 14, creatinine 1.15. White count 5.6, hemoglobin 8.2, hematocrit 26.0, platelets 351 Mandy Mandy [name] negative.   Chest radiograph with bilateral interstitial markings, hilar vascular congestion. Chest CT with bilateral groundglass opacities, bilateral pleural effusions more right than left.   EKG Mandy bpm, normal axis, normal intervals, sinus rhythm, no significant ST segment or T wave changes.   Patient was placed on intravenous furosemide with improvement of volume status. She was liberated from noninvasive mechanical ventilation. 12/13 ultrasound-guided thoracentesis 600 mL of straw-colored fluid.  Patient has been responding to diuresis well, but continue to be very weak and deconditioned. Dyspnea has been improving    Assessment & Plan:   Principal Problem:   CHF  exacerbation (Mandy [name]) Active Problems:   Diabetes mellitus type 2, controlled (Mandy Mandy [name])   Dyslipidemia   Essential hypertension   Aortic atherosclerosis (Mandy [name])   Hyperthyroidism   Cerebral thrombosis with cerebral infarction   Pressure injury of skin   Acute on chronic diastolic heart failure/ pulmonary HTN, acute on chronic core pulmonale.  Elevated troponin due to demand ischemia, no ACS   Echocardiogram with preserved LV systolic function with EF 60 to 65%. RV with with mild reduction in the systolic function, severe elevation in systolic pulmonary pressure, RV with pressure and volume overload. Left atrium with moderate dilatation, moderate TR.    Dyspnea has been improving, patient with pulmonary hypertension, will need home 02  Continue blood pressure control and diuresis with torsemide.  Continue with atenolol and hydralazine  Will need home health services.  Ok to change level of care to telemetry    2. Hx of CVA. Continue antiplatelet therapy (asa and clopidogrel) and statin, out of bed to chair tid with meals. Continue with physical therapy.    Continue Pt and Ot Out of bed to chair tid with meals.    3. T2DM with dyslipidemia.  Her capillary glucose has been well controlled, today 116 and 104.  Will discontinue insulin therapy and will continue with diet control.  Continue statin therapy with simvastatin    4. Hyperthyroidism.  Continue with methimazole    5. Hyponatremia. 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 Continue diuresis with torsemide.  Follow up renal function in am.    6. 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 Calculated BMI is 33,7 Continue local skin care.   7. Anxiety. continue with PRN diazepam.  8. Dyspepsia. Continue with pantoprazole and will add sucralfate  9. Anemia of chronic disease. Cell count stable with hgb at 8,9 and hct at  27.8  Status is: Inpatient  Remains inpatient appropriate because: dyspepsia and poor oral intake.    DVT prophylaxis: Heparin   Code Status:    full  Family Communication:   No family at the bedside      Skin Documentation: Pressure Injury 12/14/20 Labia Right Stage 1 -  Intact skin with non-blanchable redness of a localized area usually over a bony prominence. Healing, pink (Active)  12/14/20 1500  Location: Labia  Location Orientation: Right  Staging: Stage 1 -  Intact skin with non-blanchable redness of a localized area usually over a bony prominence.  Wound Description (Comments): Healing, pink  Present on Admission: Yes     Subjective: Patient with abdominal pain, dyspepsia and poor oral intake, dyspnea has been improving, no chest pain, no nausea or vomiting.   Objective: Vitals:   12/18/20 2310 12/19/20 0300 12/19/20 0452 12/19/20 0944  BP: (!) 135/53  (!) 129/54 (!) 149/56  Pulse: 73  72 74  Resp:   (!) 23   Temp: 98.1 F (36.7 C)  98 F (36.7 C)   TempSrc: Oral  Oral   SpO2: 100%  100%   Weight:  74 kg    Height:        Intake/Output Summary (Last 24 hours) at 12/19/2020 1043 Last data filed at 12/19/2020 0300 Gross per 24 hour  Intake --  Output 500 ml  Net -500 ml   Filed Weights   12/16/20 0500 12/17/20 0234 12/19/20 0300  Weight: 75.7 kg 75.5 kg 74 kg    Examination:   General: deconditioned and in pain  Neurology: Awake and alert, non focal  E ENT: no pallor, no icterus, oral mucosa moist Cardiovascular: mild JVD. S1-S2 present, rhythmic, no gallops, rubs, or murmurs. No lower extremity edema. Pulmonary: positive breath sounds bilaterally, adequate air movement, no wheezing, rhonchi or rales. Gastrointestinal. Abdomen not distended but tender to palpation at the upper quadrants,no rebound or guarding Skin. No rashes Musculoskeletal: no joint deformities     Data Reviewed: I have personally reviewed following labs and imaging  studies  CBC: Recent Labs  Lab 12/14/20 1101 12/15/20 0311 12/16/20 0413  WBC 5.6 4.5 5.3  NEUTROABS 4.1  --  3.3  HGB 8.2* 7.6* 8.8*  HCT 26.0* 24.1* 27.8*  MCV 83.3 83.1 83.7  PLT 351 321 017   Basic Metabolic Panel: Recent Labs  Lab 12/14/20 1101 12/15/20 0311 12/16/20 0413 12/17/20 0413 12/18/20 1257  NA 130* 136 135 133* 134*  K 4.2 4.0 3.5 3.8 4.5  CL 91* 92* 91* 91* 91*  CO2 33* 37* 35* 34* 35*  GLUCOSE 161* 105* 106* 123* 128*  BUN 14 14 17 18 19   CREATININE 1.15* 1.20* 1.22* 1.51* 1.28*  CALCIUM 8.6* 8.6* 8.7* 8.4* 8.8*   GFR: Estimated Creatinine Clearance: 31.2 mL/min (A) (by C-G formula based on SCr of 1.28 mg/dL (H)). Liver Function Tests: Recent Labs  Lab 12/14/20 1101 12/15/20 0311  AST 19 15  ALT 10 9  ALKPHOS 37* 31*  BILITOT 0.6 0.5  PROT 6.9 6.3*  ALBUMIN 3.8 3.4*   No results for input(s): LIPASE, AMYLASE in the last 168 hours. No results for input(s): AMMONIA in the last 168 hours. Coagulation Profile: No results for input(s): INR, PROTIME in the last 168 hours. Cardiac Enzymes: No results for input(s): CKTOTAL, CKMB,  CKMBINDEX, TROPONINI in the last 168 hours. BNP (last 3 results) No results for input(s): PROBNP in the last 8760 hours. HbA1C: No results for input(s): HGBA1C in the last 72 hours. CBG: Recent Labs  Lab 12/18/20 1214 12/18/20 1654 12/18/20 1932 12/19/20 0252 12/19/20 0742  GLUCAP 138* 91 145* 104* 116*   Lipid Profile: No results for input(s): CHOL, HDL, LDLCALC, TRIG, CHOLHDL, LDLDIRECT in the last 72 hours. Thyroid Function Tests: No results for input(s): TSH, T4TOTAL, FREET4, T3FREE, THYROIDAB in the last 72 hours. Anemia Panel: No results for input(s): VITAMINB12, FOLATE, FERRITIN, TIBC, IRON, RETICCTPCT in the last 72 hours.    Radiology Studies: I have reviewed all of the imaging during this hospital visit personally     Scheduled Meds:  aspirin EC  81 mg Oral Daily   atenolol  50 mg Oral BID    clopidogrel  75 mg Oral Daily   gabapentin  100 mg Oral QHS   guaiFENesin  600 mg Oral BID   heparin  5,000 Units Subcutaneous Q8H   hydrALAZINE  50 mg Oral Q8H   mouth rinse  15 mL Mouth Rinse BID   melatonin  10 mg Oral QHS   methimazole  2.5 mg Oral Daily   pantoprazole  40 mg Oral q morning   polyethylene glycol  17 g Oral Daily   simvastatin  40 mg Oral QHS   sucralfate  1 g Oral TID WC & HS   torsemide  40 mg Oral Daily   vitamin B-12  1,000 mcg Oral Daily   Continuous Infusions:   LOS: 5 days        Lynnda Wiersma Gerome Apley, MD

## 2020-12-20 LAB — BASIC METABOLIC PANEL
Anion gap: 9 (ref 5–15)
BUN: 21 mg/dL (ref 8–23)
CO2: 37 mmol/L — ABNORMAL HIGH (ref 22–32)
Calcium: 9.2 mg/dL (ref 8.9–10.3)
Chloride: 90 mmol/L — ABNORMAL LOW (ref 98–111)
Creatinine, Ser: 1.19 mg/dL — ABNORMAL HIGH (ref 0.44–1.00)
GFR, Estimated: 47 mL/min — ABNORMAL LOW (ref >60)
Glucose, Bld: 102 mg/dL — ABNORMAL HIGH (ref 70–99)
Potassium: 3.7 mmol/L (ref 3.5–5.1)
Sodium: 136 mmol/L (ref 135–145)

## 2020-12-20 LAB — GLUCOSE, CAPILLARY
Glucose-Capillary: 94 mg/dL (ref 70–99)
Glucose-Capillary: 107 mg/dL — ABNORMAL HIGH (ref 70–99)
Glucose-Capillary: 198 mg/dL — ABNORMAL HIGH (ref 70–99)
Glucose-Capillary: 95 mg/dL (ref 70–99)

## 2020-12-20 NOTE — Plan of Care (Signed)
°  Problem: Clinical Measurements: Goal: Diagnostic test results will improve Outcome: Progressing   Problem: Nutrition: Goal: Adequate nutrition will be maintained Outcome: Progressing   Problem: Safety: Goal: Ability to remain free from injury will improve Outcome: Progressing   

## 2020-12-20 NOTE — Progress Notes (Signed)
PROGRESS NOTE    Mandy [name]  QIO:[id number] DOB: 1940-06-24 DOA: 12/14/2020 PCP: [name], MD    Brief Narrative:  Mandy [name] was admitted to the hospital with the working diagnosis of acute on chronic hypoxic respiratory failure due to diastolic heart failure.   80 year old female with past medical history for diastolic heart failure, pulmonary hypertension, hypertension, dyslipidemia, CVA, type 2 diabetes mellitus who presented with dyspnea.  Recent hospitalization 11/21-12/06/2020 for acute hypercapnic and hypoxic respiratory failure, recurrent pleural effusion, diastolic heart failure exacerbation.  Reported about 48 hours of worsening dyspnea, associated with sharp left chest pain worse with coughing.  Her symptoms were refractory to bronchodilator therapy and supplemental oxygen. Her blood pressure was 178/77, heart rate 68, respirate 20, oxygen saturation 99%, on BiPAP, her lungs had decreased breath sounds bilaterally, she had increased work of breathing, heart S1-S2, present, rhythmic, abdomen soft, no lower extremity edema.   Sodium 130, potassium 4.2, chloride 91, bicarb 33, glucose 161, BUN 14, creatinine 1.15. White count 5.6, hemoglobin 8.2, hematocrit 26.0, platelets 351 SARS COVID-19 negative.   Chest radiograph with bilateral interstitial markings, hilar vascular congestion. Chest CT with bilateral groundglass opacities, bilateral pleural effusions more right than left.   EKG 69 bpm, normal axis, normal intervals, sinus rhythm, no significant ST segment or T wave changes.   Patient was placed on intravenous furosemide with improvement of volume status. She was liberated from noninvasive mechanical ventilation. 12/13 ultrasound-guided thoracentesis 600 mL of straw-colored fluid.   Patient has been responding to diuresis well, but continue to be very weak and deconditioned. Dyspnea has been improving      Assessment & Plan:   Principal Problem:   CHF  exacerbation ([place]) Active Problems:   Diabetes mellitus type 2, controlled ([place])   Dyslipidemia   Essential hypertension   Aortic atherosclerosis ([facility])   Hyperthyroidism   Cerebral thrombosis with cerebral infarction   Pressure injury of skin    Acute on chronic diastolic heart failure/ pulmonary HTN, acute on chronic core pulmonale.  Elevated troponin due to demand ischemia, no ACS   Echocardiogram with preserved LV systolic function with EF 60 to 65%. RV with with mild reduction in the systolic function, severe elevation in systolic pulmonary pressure, RV with pressure and volume overload. Left atrium with moderate dilatation, moderate TR.     Symptoms better today, patient will need home 02 for pulmonary hypertension, and continue diuresis for core pulmonale diastolic heart failure    On atenolol and hydralazine  Continue diuresis with torsemide.    2. Hx of CVA. Continue antiplatelet therapy (asa and clopidogrel) and statin, out of bed to chair tid with meals. Continue with physical therapy.    Continue PT and Ot, will need home health services.    3. T2DM with dyslipidemia.  Glucose continue to be stable, today fasting is 102.  Off insulin therapy.  On simvastatin    4. Hyperthyroidism.  On methimazole    5. Hyponatremia. 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 Calculated BMI is 33,7 Local skin care    7. Anxiety. on PRN diazepam.    8. Dyspepsia.  Her abdominal pain has improved, continue with pantoprazole and sucralfate   9. Anemia of chronic disease.  Follow  cell count as outpatient     Status is: Inpatient  Remains inpatient appropriate because: abdominal pain (now improved) possible dc home in am.    DVT prophylaxis:  Enoxaparin    Code Status:    full  Family Communication:   No family at the bedside      Skin Documentation: Pressure Injury 12/14/20 Labia Right Stage 1 -  Intact skin with non-blanchable redness of a localized area usually over a bony prominence. Healing, pink (Active)  12/14/20 1500  Location: Labia  Location Orientation: Right  Staging: Stage 1 -  Intact skin with non-blanchable redness of a localized area usually over a bony prominence.  Wound Description (Comments): Healing, pink  Present on Admission: Yes     Subjective: Patient is feeling better, no dyspnea and abdominal pain has improved. No dizziness today   Objective: Vitals:   12/19/20 0944 12/19/20 1459 12/19/20 2005 12/20/20 0438  BP: (!) 149/56 (!) 153/60 (!) 170/64   Pulse: 74  68   Resp:      Temp:   98.6 F (37 C)   TempSrc:   Oral   SpO2:   100%   Weight:    72.5 kg  Height:        Intake/Output Summary (Last 24 hours) at 12/20/2020 0839 Last data filed at 12/20/2020 0451 Gross per 24 hour  Intake --  Output 1100 ml  Net -1100 ml   Filed Weights   12/17/20 0234 12/19/20 0300 12/20/20 0438  Weight: 75.5 kg 74 kg 72.5 kg    Examination:   General: Not in pain or dyspnea  Neurology: Awake and alert, non focal  E ENT: mild pallor, no icterus, oral mucosa moist Cardiovascular: No JVD. S1-S2 present, rhythmic, no gallops, rubs, or murmurs. No lower extremity edema. Pulmonary: positive breath sounds bilaterally, adequate air movement, no wheezing, rhonchi or rales. Gastrointestinal. Abdomen  soft and non tender Skin. No rashes Musculoskeletal: no joint deformities     Data Reviewed: I have personally reviewed following labs and imaging studies  CBC: Recent Labs  Lab 12/14/20 1101 12/15/20 0311 12/16/20 0413  WBC 5.6 4.5 5.3  NEUTROABS 4.1  --  3.3  HGB 8.2* 7.6* 8.8*  HCT 26.0* 24.1* 27.8*  MCV 83.3 83.1 83.7  PLT 351 321 384   Basic Metabolic Panel: Recent Labs  Lab 12/15/20 0311  12/16/20 0413 12/17/20 0413 12/18/20 1257 12/20/20 0425  NA 136 135 133* 134* 136  K 4.0 3.5 3.8 4.5 3.7  CL 92* 91* 91* 91* 90*  CO2 37* 35* 34* 35* 37*  GLUCOSE 105* 106* 123* 128* 102*  BUN 14 17 18 19 21   CREATININE 1.20* 1.22* 1.51* 1.28* 1.19*  CALCIUM 8.6* 8.7* 8.4* 8.8* 9.2   GFR: Estimated Creatinine Clearance: 33.2 mL/min (A) (by C-G formula based on SCr of 1.19 mg/dL (H)). Liver Function Tests: Recent Labs  Lab 12/14/20 1101 12/15/20 0311  AST 19 15  ALT 10 9  ALKPHOS 37* 31*  BILITOT 0.6 0.5  PROT 6.9 6.3*  ALBUMIN 3.8 3.4*   No results for input(s): LIPASE, AMYLASE in the last 168 hours. No results for input(s): AMMONIA in the last 168 hours. Coagulation Profile: No results for input(s): INR, PROTIME in the last 168 hours. Cardiac Enzymes: No results for input(s): CKTOTAL, CKMB, CKMBINDEX, TROPONINI in the last 168 hours. BNP (last 3 results) No results for input(s): PROBNP in the last 8760 hours. HbA1C: No results for input(s): HGBA1C in the last 72  hours. CBG: Recent Labs  Lab 12/19/20 1235 12/19/20 1734 12/19/20 2018 12/20/20 0300 12/20/20 0728  GLUCAP 96 113* 119* 94 107*   Lipid Profile: No results for input(s): CHOL, HDL, LDLCALC, TRIG, CHOLHDL, LDLDIRECT in the last 72 hours. Thyroid Function Tests: No results for input(s): TSH, T4TOTAL, FREET4, T3FREE, THYROIDAB in the last 72 hours. Anemia Panel: No results for input(s): VITAMINB12, FOLATE, FERRITIN, TIBC, IRON, RETICCTPCT in the last 72 hours.    Radiology Studies: I have reviewed all of the imaging during this hospital visit personally     Scheduled Meds:  aspirin EC  81 mg Oral Daily   atenolol  50 mg Oral BID   clopidogrel  75 mg Oral Daily   gabapentin  100 mg Oral QHS   guaiFENesin  600 mg Oral BID   heparin  5,000 Units Subcutaneous Q8H   hydrALAZINE  50 mg Oral Q8H   mouth rinse  15 mL Mouth Rinse BID   melatonin  10 mg Oral QHS   methimazole  2.5 mg Oral Daily    pantoprazole  40 mg Oral q morning   polyethylene glycol  17 g Oral Daily   simvastatin  40 mg Oral QHS   sucralfate  1 g Oral TID WC & HS   torsemide  40 mg Oral Daily   vitamin B-12  1,000 mcg Oral Daily   Continuous Infusions:   LOS: 6 days        Janaiyah Blackard Gerome Apley, MD

## 2020-12-21 ENCOUNTER — Other Ambulatory Visit (HOSPITAL_COMMUNITY): Payer: Self-pay

## 2020-12-21 ENCOUNTER — Encounter: Payer: Self-pay | Admitting: *Deleted

## 2020-12-21 DIAGNOSIS — E785 Hyperlipidemia, unspecified: Secondary | ICD-10-CM

## 2020-12-21 DIAGNOSIS — I10 Essential (primary) hypertension: Secondary | ICD-10-CM

## 2020-12-21 LAB — GLUCOSE, CAPILLARY
Glucose-Capillary: 107 mg/dL — ABNORMAL HIGH (ref 70–99)
Glucose-Capillary: 100 mg/dL — ABNORMAL HIGH (ref 70–99)
Glucose-Capillary: 143 mg/dL — ABNORMAL HIGH (ref 70–99)
Glucose-Capillary: 124 mg/dL — ABNORMAL HIGH (ref 70–99)

## 2020-12-21 MED ORDER — TORSEMIDE 20 MG PO TABS
40.0000 mg | ORAL_TABLET | Freq: Every day | ORAL | 0 refills | Status: DC
  Filled 2020-12-21: qty 30, 30d supply, fill #0
  Filled 2020-12-22 (×2): qty 60, 30d supply, fill #0

## 2020-12-21 NOTE — Telephone Encounter (Signed)
Tried calling daughter again, no answer and no option to leave msg. Letter mailed.

## 2020-12-21 NOTE — TOC Transition Note (Signed)
Transition of Care [facility]) - CM/SW Discharge Note   Patient Details  Name: Mandy [name] MRN: [id number] Date of Birth: January 21, 1940  Transition of Care [facility]) CM/SW Contact:  Ross Ludwig, LCSW Phone Number: 12/21/2020, 3:49 PM   Clinical Narrative:     Patient currently open with home health PT and OT through [facility].  CSW signing off please reconsult with any other social work needs, home health agency has been notified of planned discharge.    Final next level of care: [place] Barriers to Discharge: Barriers Resolved   Patient Goals and CMS Choice Patient states their goals for this hospitalization and ongoing recovery are:: To return back home with home health      Discharge Placement  Home with home health.                     Discharge Plan and [place] with home health                        HH Arranged: PT, OT [place] Agency: [place] Date [place]: 12/21/20 Time [place]: 1549 Representative spoke with at [place]: [place] ([place]) Interventions     Readmission Risk Interventions Readmission Risk Prevention Plan 11/06/2020  Transportation Screening Complete  PCP or Specialist Appt within 3-5 Days Complete  HRI or [place] Complete  Social Work Consult for [place] Planning/Counseling Complete  Palliative Care Screening Complete  Medication Review [employer]) Complete  Some recent data might be hidden

## 2020-12-21 NOTE — Discharge Summary (Addendum)
Physician Discharge Summary  Mandy [name] EXB:[id number] DOB: 08/08/1940 DOA: 12/14/2020  PCP: Mandy Ebbs, MD  Admit date: 12/14/2020 Discharge date: 12/21/2020  Admitted From: Home  Disposition:  Home   Recommendations for Outpatient Follow-up and new medication changes:  Follow up with Dr Mandy [name] in 7 to 10 days Started on torsemide for diuresis Follow up on renal function and electrolytes in 7 days.   I spoke over the phone with the patient's daughter about patient's  condition, plan of care, prognosis and all questions were addressed.   Home Health: yes   Equipment/Devices: home 02    Discharge Condition: stable  CODE STATUS: full  Diet recommendation:  heart healthy   Brief/Interim Summary: Mandy [name] was admitted to the hospital with the working diagnosis of acute on chronic hypoxic respiratory failure due to diastolic heart failure exacerbation.   80 year old female with past medical history for diastolic heart failure, pulmonary hypertension, hypertension, dyslipidemia, CVA, type 2 diabetes mellitus who presented with dyspnea. Recent hospitalization 11/21-12/06/2020 for acute hypercapnic and hypoxic respiratory failure, recurrent pleural effusion, diastolic heart failure exacerbation.  Reported about 48 hours of worsening dyspnea, associated with sharp left chest pain worse with coughing.  Her symptoms were refractory to bronchodilator therapy and supplemental oxygen. Her blood pressure was 178/77, heart rate 68, respiratory rate 20, oxygen saturation 99%, on BiPAP, her lungs had decreased breath sounds bilaterally, she had increased work of breathing, heart S1-S2, present, rhythmic, abdomen soft, no lower extremity edema.   Sodium 130, potassium 4.2, chloride 91, bicarb 33, glucose 161, BUN 14, creatinine 1.15. White count 5.6, hemoglobin 8.2, hematocrit 26.0, platelets 351 SARS COVID-19 negative.   Chest radiograph with bilateral interstitial markings, hilar vascular  congestion. Chest CT with bilateral groundglass opacities, bilateral pleural effusions more right than left.   EKG 69 bpm, normal axis, normal intervals, sinus rhythm, no significant ST segment or T wave changes.   Patient was placed on intravenous furosemide with improvement of volume status. She was liberated from noninvasive mechanical ventilation. 12/13 ultrasound-guided thoracentesis 600 mL of straw-colored fluid.   Patient has been responding to diuresis well, but continue to be very weak and deconditioned. Dyspnea has been improving   Plan to continue diuresis with torsemide and follow ap as outpatient.   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 Patient was admitted to the telemetry ward, she underwent diuresis with intravenous furosemide, negative fluid balance was achieved with significant improvement of her symptoms. Elevated troponin due to demand ischemia, no acute coronary syndrome.  Further work-up with echocardiography showed a preserved LV systolic function, ejection fraction 60 to 65%, RV with mild reduction in systolic function, severe elevation of systolic pulmonary pressure, RV with pressure and volume overload.  Left atrium with moderate dilatation.  Moderate TR.  Patient was placed on torsemide with good toleration.  She will continue submental oxygen per nasal cannula, blood pressure control with atenolol and hydralazine.  2.  History of CVA.  Continue dual antiplatelet therapy with aspirin clopidogrel, continue statin therapy. Continue home health services.  3.  Type 2 diabetes mellitus.  Dyslipidemia.  Her glucose remained stable during her hospitalization.  Continue simvastatin.  4.  Hypothyroidism.  Continue with methimazole.  5.  Hyponatremia, acute kidney injury on chronic kidney disease stage IIIa.  She had close follow-up with her kidney function electrolytes. Responded well to diuresis.  At  discharge sodium 136, potassium 3.7, chloride 90, bicarb 37, glucose 102, BUN 21, creatinine 1.19.  Follow-up kidney function and electrolytes as an outpatient.  Continue torsemide.  6.  Obesity class I, stage I right labia pressure ulcer, present on admission.  Calculated BMI 33.7, continue local skin care.  7.  Anxiety.  As needed diazepam.  8.  Dyspepsia.  Her symptoms improved with antiacid therapy, she received pantoprazole and sucralfate.  9.  Anemia chronic disease.  Her cell count has remained stable, follow-up as an outpatient.  Discharge Diagnoses:  Principal Problem:   CHF exacerbation ([place]) Active Problems:   Diabetes mellitus type 2, controlled ([place])   Dyslipidemia   Essential hypertension   Aortic atherosclerosis ([place])   Hyperthyroidism   Cerebral thrombosis with cerebral infarction   Pressure injury of skin    Discharge Instructions   Allergies as of 12/21/2020   No Known Allergies      Medication List     STOP taking these medications    furosemide 20 MG tablet Commonly known as: LASIX       TAKE these medications    acetaminophen 500 MG tablet Commonly known as: TYLENOL Take 1,000 mg by mouth every 6 (six) hours as needed for fever or headache (pain).   albuterol (2.5 MG/3ML) 0.083% nebulizer solution Commonly known as: PROVENTIL Take 2.5 mg by nebulization every 6 (six) hours as needed for wheezing or shortness of breath.   albuterol 108 (90 Base) MCG/ACT inhaler Commonly known as: VENTOLIN HFA Inhale 2 puffs into the lungs every 6 (six) hours as needed for wheezing or shortness of breath.   alendronate 70 MG tablet Commonly known as: FOSAMAX Take 70 mg by mouth every Tuesday. Take with a full glass of water on an empty stomach.   aspirin 81 MG EC tablet Take 1 tablet (81 mg total) by mouth daily for 21 days. Swallow whole.   atenolol 50 MG tablet Commonly known as: TENORMIN Take 50 mg by mouth 2 (two) times daily.   calcium  carbonate 1500 (600 Ca) MG Tabs tablet Commonly known as: OSCAL Take 600 mg of elemental calcium by mouth 2 (two) times daily with a meal.   clopidogrel 75 MG tablet Commonly known as: PLAVIX Take 1 tablet (75 mg total) by mouth daily.   diazepam 2 MG tablet Commonly known as: VALIUM Take 2 mg by mouth every 12 (twelve) hours as needed for anxiety.   diclofenac sodium 1 % Gel Commonly known as: VOLTAREN Apply 4 g topically 4 (four) times daily as needed for pain. shoulder   FISH OIL PO Take 1 capsule by mouth every morning.   gabapentin 100 MG capsule Commonly known as: NEURONTIN Take 100 mg by mouth at bedtime.   hydrALAZINE 100 MG tablet Commonly known as: APRESOLINE Take 0.5 tablets (50 mg total) by mouth 3 (three) times daily.   melatonin 5 MG Tabs Take 10 mg by mouth at bedtime.   methimazole 5 MG tablet Commonly known as: TAPAZOLE Take 0.5 tablets (2.5 mg total) by mouth daily.   multivitamin with minerals Tabs tablet Take 1 tablet by mouth every morning.   OXYGEN Inhale 2-4 L into the lungs continuous.   pantoprazole 40 MG tablet Commonly known as: PROTONIX Take 1 tablet (40 mg total) by mouth daily at 6 (six) AM. What changed: when to take this   potassium chloride SA 20 MEQ tablet Commonly known as: KLOR-CON M Take 1 tablet (20 mEq total) by mouth daily.   simvastatin 40 MG tablet Commonly known as: ZOCOR Take 1 tablet (40 mg total)  by mouth at bedtime.   THERAFLU FLU/COLD/COUGH PO Take 30 mLs by mouth 2 (two) times daily as needed (cough/congestion).   Torsemide 40 MG Tabs Take 40 mg by mouth daily.   vitamin B-12 1000 MCG tablet Commonly known as: CYANOCOBALAMIN Take 1,000 mcg by mouth daily.   vitamin C 500 MG tablet Commonly known as: ASCORBIC ACID Take 500 mg by mouth daily.   Vitamin D (Cholecalciferol) 25 MCG (1000 UT) Tabs Take 1,000 Units by mouth daily.   zinc gluconate 50 MG tablet Take 50 mg by mouth daily.                Durable Medical Equipment  (From admission, onward)           Start     Ordered   12/18/20 1350  For home use only DME oxygen  Once       Question Answer Comment  Length of Need 6 Months   Mode or (Route) Nasal cannula   Liters per Minute 2   Frequency Continuous (stationary and portable oxygen unit needed)   Oxygen conserving device Yes   Oxygen delivery system Gas      12/18/20 1350            No Known Allergies    Procedures/Studies: DG Chest 1 View  Result Date: 12/05/2020 CLINICAL DATA:  Hypoxia EXAM: CHEST  1 VIEW COMPARISON:  Chest x-rays dated 12/02/2020 and 11/30/2020. FINDINGS: Overall no significant interval change. Stable cardiomegaly. Mild central pulmonary vascular congestion. Veiled bibasilar opacities, likely small pleural effusions and/or atelectasis. IMPRESSION: 1. Stable cardiomegaly with central pulmonary vascular congestion suggesting mild CHF/volume overload. 2. Probable small bilateral pleural effusions and/or atelectasis. Electronically Signed   By: Franki Cabot M.D.   On: 12/05/2020 16:32   CT HEAD WO CONTRAST (5MM)  Result Date: 12/14/2020 CLINICAL DATA:  Transient ischemic attack EXAM: CT HEAD WITHOUT CONTRAST TECHNIQUE: Contiguous axial images were obtained from the base of the skull through the vertex without intravenous contrast. COMPARISON:  MRI 12/07/2020.  CT 12/06/2020. FINDINGS: Brain: No focal abnormality affects the brainstem or cerebellum. Cerebral hemispheres show moderate chronic small-vessel ischemic changes of the white matter. Late subacute right frontal cortical and subcortical infarction as shown on the previous studies from 1 week ago. No sign of new insult/acute infarction, mass lesion, hemorrhage, hydrocephalus or extra-axial collection. Vascular: There is atherosclerotic calcification of the major vessels at the base of the brain. Skull: Negative Sinuses/Orbits: Clear/normal Other: None IMPRESSION: No new finding since  the previous recent exams. Subacute infarction in the right frontal cortical and subcortical brain. No evidence of hemorrhage or extension. Chronic small-vessel ischemic changes elsewhere within the cerebral hemispheric white matter. Electronically Signed   By: Nelson Chimes M.D.   On: 12/14/2020 20:02   CT HEAD WO CONTRAST (5MM)  Result Date: 12/06/2020 CLINICAL DATA:  80 year old female with dizziness. EXAM: CT HEAD WITHOUT CONTRAST TECHNIQUE: Contiguous axial images were obtained from the base of the skull through the vertex without intravenous contrast. COMPARISON:  Head CT 09/24/2020. FINDINGS: Brain: Patchy and confluent bilateral cerebral white matter hypodensity compatible with chronic small vessel disease. But in the anterior right MCA territory there is new cytotoxic edema or developing encephalomalacia since September (series 2, image 24) in an area of about 18 mm. No associated hemorrhage or mass effect. No changes of recent cortically based infarct elsewhere. Stable gray-white matter differentiation elsewhere. No superimposed shift, ventriculomegaly, mass effect, evidence of mass lesion, intracranial hemorrhage. Vascular: Calcified  atherosclerosis at the skull base. No suspicious intracranial vascular hyperdensity. Skull: No acute osseous abnormality identified. Sinuses/Orbits: Visualized paranasal sinuses and mastoids are stable and well aerated. Tympanic cavities are clear. Other: No acute orbit or scalp soft tissue finding. IMPRESSION: 1. 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 This is new since September. No associated hemorrhage or mass effect. 2. Otherwise stable CT appearance of chronic small vessel disease. These results will be called to the ordering clinician or representative by the Radiologist Assistant, and communication documented in the PACS or [employer]. Electronically Signed   By: Genevie Ann M.D.   On: 12/06/2020 11:54   CT CHEST WO  CONTRAST  Result Date: 12/14/2020 CLINICAL DATA:  Shortness of breath and left-sided chest pain. 11/23/2020 EXAM: CT CHEST WITHOUT CONTRAST TECHNIQUE: Multidetector CT imaging of the chest was performed following the standard protocol without IV contrast. COMPARISON:  None. FINDINGS: Cardiovascular: Heart is enlarged. No substantial pericardial effusion. Mild atherosclerotic calcification is noted in the wall of the thoracic aorta. Enlargement of the pulmonary outflow tract and main pulmonary arteries suggests pulmonary arterial hypertension. Mediastinum/Nodes: Scattered upper normal mediastinal lymph nodes similar to prior. No evidence for gross hilar lymphadenopathy although assessment is limited by the lack of intravenous contrast on the current study. The esophagus has normal imaging features. There is no axillary lymphadenopathy. Lungs/Pleura: Collapse/consolidative changes noted in the right middle lobe, similar to prior with associated right lower lobe collapse/consolidation. Moderate right pleural effusion is similar. Focal consolidative opacity in the left suprahilar region is similar to prior, better characterized on previous CT with contrast. Subsegmental atelectasis noted left upper lobe. Small left pleural effusion evident. Upper Abdomen: Unremarkable. Musculoskeletal: No worrisome lytic or sclerotic osseous abnormality. Degenerative changes noted both shoulders. IMPRESSION: 1. Similar appearance of collapse/consolidative changes in the right middle lobe and right lower lobes. 2. Focal consolidative opacity in the left suprahilar region is similar to prior, better characterized on previous CT with contrast. 3. Moderate right and small left pleural effusions, similar to prior. 4. Enlargement of the pulmonary outflow tract and main pulmonary arteries suggests pulmonary arterial hypertension. 5. Aortic Atherosclerosis (ICD10-I70.0). Electronically Signed   By: Misty Stanley M.D.   On: 12/14/2020 14:37    CT Angio Chest PE W and/or Wo Contrast  Result Date: 11/23/2020 CLINICAL DATA:  Difficulty breathing. EXAM: CT ANGIOGRAPHY CHEST WITH CONTRAST TECHNIQUE: Multidetector CT imaging of the chest was performed using the standard protocol during bolus administration of intravenous contrast. Multiplanar CT image reconstructions and MIPs were obtained to evaluate the vascular anatomy. CONTRAST:  1mL OMNIPAQUE IOHEXOL 350 MG/ML SOLN COMPARISON:  October 02, 2020. FINDINGS: Cardiovascular: Satisfactory opacification of the pulmonary arteries to the segmental level. No evidence of pulmonary embolism. Moderate cardiomegaly is noted. 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 No pericardial effusion. Atherosclerosis of thoracic aorta is noted without dissection. Mediastinum/Nodes: No enlarged mediastinal, hilar, or axillary lymph nodes. Thyroid gland, trachea, and esophagus demonstrate no significant findings. Lungs/Pleura: No pneumothorax is noted. Moderate right pleural effusion is noted with associated atelectasis of the right lower lobe. Mild subsegmental atelectasis is seen involving the right middle lobe and lingular segment of the left upper lobe. Upper Abdomen: No acute abnormality. Musculoskeletal: No chest wall abnormality. No acute or significant osseous findings. Review of the MIP images confirms the above findings. IMPRESSION: No definite evidence of pulmonary embolus. Moderate size right pleural effusion is noted with associated atelectasis of the right lower lobe. Mild subsegmental  atelectasis is also noted in the right middle lobe and lingular segment of left upper lobe. 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 Aortic Atherosclerosis (ICD10-I70.0). Electronically Signed   By: Marijo Conception M.D.   On: 11/23/2020 13:47   MR ANGIO HEAD WO CONTRAST  Result Date: 12/07/2020 CLINICAL DATA:  Stroke, follow-up EXAM: MRI  HEAD WITHOUT CONTRAST MRA HEAD WITHOUT CONTRAST TECHNIQUE: Multiplanar, multi-echo pulse sequences of the brain and surrounding structures were acquired without intravenous contrast. Angiographic images of the Circle of Willis were acquired using MRA technique without intravenous contrast. COMPARISON:  CT head 12/06/2020, correlation is also made with MRI head 06/02/2005 FINDINGS: MRI HEAD FINDINGS Brain: Wedge-shaped area of restricted diffusion with ADC correlate in the right frontal lobe in the right MCA territory (series 5, images 19-24), consistent with the findings seen on the 12/06/2020 CT head. This area is associated with increased T2 signal. No acute hemorrhage, mass, mass effect, or midline shift. No hydrocephalus or extra-axial collection. T2 hyperintense signal in the periventricular white matter and pons, likely the sequela of moderate chronic small vessel ischemic disease. Vascular: Please see MRA findings below. Skull and upper cervical spine: Normal marrow signal. Degenerative changes in the cervical spine, with reversal of the normal cervical lordosis. Sinuses/Orbits: No acute or significant finding. Status post right lens replacement. Other: Trace fluid in right mastoid air cells. MRA HEAD FINDINGS Anterior circulation: Both internal carotid arteries are patent to the termini, with mild irregularity in right greater than left cavernous ICA and moderate narrowing of the right greater than left supraclinoid ICA, likely secondary to atherosclerotic plaque. A1 segments patent, although there may be focal narrowing at the origin of the right greater than A1. Normal anterior communicating artery. Anterior cerebral arteries are patent to their distal aspects. No M1 stenosis or occlusion. Normal MCA bifurcations. Narrowing of a proximal right M2 branch (series 4, image 79). Distal MCA branches perfused and symmetric. There is possible focal stenosis proximally of a small right MCA branch (series 4, image  84), which appears to supply the right frontal lobe area described above as having restricted diffusion. Posterior circulation: Vertebral arteries patent to the vertebrobasilar junction without stenosis. Posterior inferior cerebral arteries visualized on the left. Basilar patent to its distal aspect. Superior cerebellar arteries patent bilaterally. Diminutive right P1, with near fetal origin of the right PCA. PCAs perfused to their distal aspects without stenosis. The bilateral posterior communicating arteries are visualized. Anatomic variants: None significant IMPRESSION: 1. Restricted diffusion in the right MCA territory, with associated increased T2 signal, which correlates with the findings seen on the 12/06/2020 CT head and is consistent with subacute infarct. 2. No intracranial large vessel occlusion. Multifocal narrowing in the intracranial ICAs, likely secondary to atherosclerotic plaque. In addition, there is possible focal stenosis proximally of a right MCA branch, which appears to supply the right anterior frontal lobe in the area of infarction. Electronically Signed   By: Merilyn Baba M.D.   On: 12/07/2020 12:44   MR BRAIN WO CONTRAST  Result Date: 12/07/2020 CLINICAL DATA:  Stroke, follow-up EXAM: MRI HEAD WITHOUT CONTRAST MRA HEAD WITHOUT CONTRAST TECHNIQUE: Multiplanar, multi-echo pulse sequences of the brain and surrounding structures were acquired without intravenous contrast. Angiographic images of the Circle of Willis were acquired using MRA technique without intravenous contrast. COMPARISON:  CT head 12/06/2020, correlation is also made with MRI head 06/02/2005 FINDINGS: MRI HEAD FINDINGS Brain: Wedge-shaped area of restricted diffusion with ADC correlate in the right  frontal lobe in the right MCA territory (series 5, images 19-24), consistent with the findings seen on the 12/06/2020 CT head. This area is associated with increased T2 signal. No acute hemorrhage, mass, mass effect, or midline  shift. No hydrocephalus or extra-axial collection. T2 hyperintense signal in the periventricular white matter and pons, likely the sequela of moderate chronic small vessel ischemic disease. Vascular: Please see MRA findings below. Skull and upper cervical spine: Normal marrow signal. Degenerative changes in the cervical spine, with reversal of the normal cervical lordosis. Sinuses/Orbits: No acute or significant finding. Status post right lens replacement. Other: Trace fluid in right mastoid air cells. MRA HEAD FINDINGS Anterior circulation: Both internal carotid arteries are patent to the termini, with mild irregularity in right greater than left cavernous ICA and moderate narrowing of the right greater than left supraclinoid ICA, likely secondary to atherosclerotic plaque. A1 segments patent, although there may be focal narrowing at the origin of the right greater than A1. Normal anterior communicating artery. Anterior cerebral arteries are patent to their distal aspects. No M1 stenosis or occlusion. Normal MCA bifurcations. Narrowing of a proximal right M2 branch (series 4, image 79). Distal MCA branches perfused and symmetric. There is possible focal stenosis proximally of a small right MCA branch (series 4, image 84), which appears to supply the right frontal lobe area described above as having restricted diffusion. Posterior circulation: Vertebral arteries patent to the vertebrobasilar junction without stenosis. Posterior inferior cerebral arteries visualized on the left. Basilar patent to its distal aspect. Superior cerebellar arteries patent bilaterally. Diminutive right P1, with near fetal origin of the right PCA. PCAs perfused to their distal aspects without stenosis. The bilateral posterior communicating arteries are visualized. Anatomic variants: None significant IMPRESSION: 1. Restricted diffusion in the right MCA territory, with associated increased T2 signal, which correlates with the findings seen on  the 12/06/2020 CT head and is consistent with subacute infarct. 2. No intracranial large vessel occlusion. Multifocal narrowing in the intracranial ICAs, likely secondary to atherosclerotic plaque. In addition, there is possible focal stenosis proximally of a right MCA branch, which appears to supply the right anterior frontal lobe in the area of infarction. Electronically Signed   By: Merilyn Baba M.D.   On: 12/07/2020 12:44   [place] CHEST (PLEURAL EFFUSION)  Result Date: 11/24/2020 CLINICAL DATA:  Right pleural effusion, assess volume for thoracentesis EXAM: CHEST ULTRASOUND COMPARISON:  11/23/2020 FINDINGS: Ultrasound performed of the right posterior chest with the patient upright. Small non loculated right pleural effusion noted, not enough to warrant therapeutic thoracentesis. Procedure not performed. IMPRESSION: Small right effusion by ultrasound. Electronically Signed   By: Jerilynn Mages.  Shick M.D.   On: 11/24/2020 14:58   CARDIAC CATHETERIZATION  Result Date: 11/30/2020   Mid LAD lesion is 30% stenosed.   Dist LAD lesion is 30% stenosed.   LV end diastolic pressure is low.   LV end diastolic pressure is normal.   The left ventricular ejection fraction is 55-65% by visual estimate.   Hemodynamic findings consistent with moderate pulmonary hypertension. 1.  Mild obstructive coronary artery disease. 2.  Preserved cardiac output of 6.1 L/min and an index of 3.55 L/min/m. 3.  Mean pulmonary artery pressure of 39 mmHg with pulmonary vascular resistance of 3.44 [place] units; transpulmonary gradient of 41mmHg (mean wedge pressure 19mmHg) 4.  LVEDP of 9 mmHg with preserved LVEF. Recommendation: Medical therapy.   DG Chest Port 1 View  Result Date: 12/15/2020 CLINICAL DATA:  80 year old female status post thoracentesis. EXAM:  PORTABLE CHEST 1 VIEW COMPARISON:  Chest x-ray 12/12/2020. FINDINGS: Previously noted right-sided pleural effusion is now small. No definite postprocedural pneumothorax. Persistent moderate  left pleural effusion. Persistent bibasilar opacities which may reflect areas of atelectasis and/or consolidation. There is cephalization of the pulmonary vasculature and slight indistinctness of the interstitial markings suggestive of mild pulmonary edema. Mild cardiomegaly. Upper mediastinal contours are within normal limits. Atherosclerotic calcifications are noted in the thoracic aorta. IMPRESSION: 1. Decreasing size of what is now a small right pleural effusion following thoracentesis. No postprocedural pneumothorax. 2. The appearance of the chest suggests congestive heart failure, as above. 3. Aortic atherosclerosis. Electronically Signed   By: Vinnie Langton M.D.   On: 12/15/2020 12:37   DG Chest Port 1 View  Result Date: 12/14/2020 CLINICAL DATA:  Shortness of breath EXAM: PORTABLE CHEST 1 VIEW COMPARISON:  12/05/2020 FINDINGS: Perihilar and bibasilar opacities. Small pleural effusions. No pneumothorax. Cardiomegaly. IMPRESSION: Similar findings of pulmonary edema and small pleural effusions. Electronically Signed   By: Macy Mis M.D.   On: 12/14/2020 11:46   DG CHEST PORT 1 VIEW  Result Date: 12/02/2020 CLINICAL DATA:  Shortness of breath EXAM: PORTABLE CHEST 1 VIEW COMPARISON:  Chest radiograph 11/30/2020 FINDINGS: The heart is enlarged, unchanged. The mediastinal contours are stable. There are bilateral pleural effusions with bibasilar airspace disease, overall slightly worsened since 11/30/2020. The upper lungs remain aerated. There is no pneumothorax. The bones are stable. IMPRESSION: Bilateral pleural effusions with adjacent airspace disease/consolidation, worsened since 11/30/2020. Electronically Signed   By: Valetta Mole M.D.   On: 12/02/2020 08:52   DG CHEST PORT 1 VIEW  Result Date: 11/30/2020 CLINICAL DATA:  Bilateral pleural effusions and bibasilar atelectasis. EXAM: PORTABLE CHEST 1 VIEW COMPARISON:  11/28/2020 and CT chest 11/23/2020. FINDINGS: Trachea is midline. Heart is  enlarged. Thoracic aorta is calcified. Bibasilar collapse/consolidation with bilateral pleural effusions. Left perihilar airspace consolidation. Scattered linear subsegmental atelectasis. Degenerative changes in the shoulders, left greater than right. IMPRESSION: 1. Bibasilar collapse/consolidation. Difficult to exclude pneumonia. 2. Bilateral pleural effusions, similar. 3. Left perihilar airspace consolidation may be infectious/inflammatory. Recommend attention on follow-up. 4. Scattered subsegmental atelectasis. Electronically Signed   By: Lorin Picket M.D.   On: 11/30/2020 08:11   DG CHEST PORT 1 VIEW  Result Date: 11/28/2020 CLINICAL DATA:  Shortness of breath. 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 EXAM: PORTABLE CHEST 1 VIEW COMPARISON:  Chest x-ray 11/25/2020.  Chest CT 11/23/2020. FINDINGS: Cardiomediastinal silhouette appears enlarged unchanged. There are small bilateral pleural effusions, right greater than left. These are unchanged from prior. There is central pulmonary vascular congestion and bibasilar opacities. No pneumothorax. No acute fractures. IMPRESSION: 1. Cardiomegaly with central pulmonary vascular congestion 2. Stable bilateral pleural effusions and bibasilar atelectasis/airspace disease. Electronically Signed   By: Ronney Asters M.D.   On: 11/28/2020 16:22   DG CHEST PORT 1 VIEW  Result Date: 11/25/2020 CLINICAL DATA:  Thoracentesis EXAM: PORTABLE CHEST 1 VIEW COMPARISON:  11/23/2020 FINDINGS: Single frontal view of the chest demonstrates persistent enlargement of the cardiac silhouette. There are bilateral pleural effusions, decreased on the right after thoracentesis. No evidence of pneumothorax. No acute bony abnormalities. IMPRESSION: 1. Decreased right pleural effusion after thoracentesis. No evidence of complication. 2. Stable left pleural effusion. Electronically Signed   By: Randa Ngo M.D.   On: 11/25/2020 19:44   DG Chest Port 1 View  Result Date:  11/23/2020 CLINICAL DATA:  Shortness of breath, difficulty breathing EXAM: PORTABLE CHEST 1 VIEW COMPARISON:  11/20/2020 FINDINGS: Cardiomegaly. Diffuse bilateral airspace disease, worsening since prior study. Bilateral pleural effusions, right larger than left also slightly worsening since prior study. Elevation of the right diaphragm. No acute bony abnormality. IMPRESSION: Worsening bilateral airspace disease and effusions, favor edema/CHF. Electronically Signed   By: Rolm Baptise M.D.   On: 11/23/2020 11:47   ECHOCARDIOGRAM COMPLETE  Result Date: 12/15/2020    ECHOCARDIOGRAM REPORT   Patient Name:   Mandy [name] Date of Exam: 12/15/2020 Medical Rec #:  [id number]      Height:       59.0 in Accession #:    [id number]     Weight:       166.9 lb Date of Birth:  [birth year]       BSA:          1.708 m Patient Age:    35 years       BP:           166/72 mmHg Patient Gender: F              HR:           67 bpm. Exam Location:  Inpatient Procedure: 2D Echo, Cardiac Doppler and Color Doppler Indications:    CHF  History:        Patient has prior history of Echocardiogram examinations, most                 recent 11/24/2020. CHF, Signs/Symptoms:Chest Pain; Risk                 Factors:Diabetes and Hypertension.  Sonographer:    Glo Herring Referring Phys: [id number] [place]  1. Left ventricular ejection fraction, by estimation, is 60 to 65%. The left ventricle has normal function. The left ventricle has no regional wall motion abnormalities. There is moderate concentric left ventricular hypertrophy. Left ventricular diastolic parameters are consistent with Grade II diastolic dysfunction (pseudonormalization). Elevated left atrial pressure. There is the interventricular septum is flattened in systole and diastole, consistent with right ventricular pressure and volume  overload.  2. Right ventricular systolic function is mildly reduced. The right ventricular size is normal. There is severely elevated  pulmonary artery systolic pressure.  3. Left atrial size was moderately dilated.  4. Right atrial size was mildly dilated.  5. The mitral valve is normal in structure. Trivial mitral valve regurgitation. No evidence of mitral stenosis.  6. Tricuspid valve regurgitation is moderate.  7. The aortic valve is tricuspid. Aortic valve regurgitation is not visualized. No aortic stenosis is present.  8. The inferior vena cava is dilated in size with <50% respiratory variability, suggesting right atrial pressure of 15 mmHg. Comparison(s): No significant change from prior study. Prior images reviewed side by side. FINDINGS  Left Ventricle: Left ventricular ejection fraction, by estimation, is 60 to 65%. The left ventricle has normal function. The left ventricle has no regional wall motion abnormalities. The left ventricular internal cavity size was normal in size. There is  moderate concentric left ventricular hypertrophy. The interventricular septum is flattened in systole and diastole, consistent with right ventricular pressure and volume overload. Left ventricular diastolic parameters are consistent with Grade II diastolic dysfunction (pseudonormalization). Elevated left atrial pressure. Right Ventricle: The right ventricular size is normal. No increase in right ventricular wall thickness. Right ventricular systolic function is mildly reduced. There is severely elevated pulmonary artery systolic pressure. The tricuspid regurgitant velocity is 4.13 m/s, and with an assumed right atrial pressure of 15 mmHg,  the estimated right ventricular systolic pressure is 11.9 mmHg. Left Atrium: Left atrial size was moderately dilated. Right Atrium: Right atrial size was mildly dilated. Pericardium: There is no evidence of pericardial effusion. Mitral Valve: The mitral valve is normal in structure. Trivial mitral valve regurgitation. No evidence of mitral valve stenosis. Tricuspid Valve: The tricuspid valve is normal in structure.  Tricuspid valve regurgitation is moderate . No evidence of tricuspid stenosis. Aortic Valve: The aortic valve is tricuspid. Aortic valve regurgitation is not visualized. No aortic stenosis is present. Aortic valve mean gradient measures 6.0 mmHg. Aortic valve peak gradient measures 11.4 mmHg. Aortic valve area, by VTI measures 1.80  cm. Pulmonic Valve: The pulmonic valve was normal in structure. Pulmonic valve regurgitation is trivial. No evidence of pulmonic stenosis. Aorta: The aortic root is normal in size and structure. Venous: The inferior vena cava is dilated in size with less than 50% respiratory variability, suggesting right atrial pressure of 15 mmHg. IAS/Shunts: No atrial level shunt detected by color flow Doppler.  LEFT VENTRICLE PLAX 2D LVIDd:         4.25 cm   Diastology LVIDs:         2.95 cm   LV e' medial:    4.79 cm/s LV PW:         1.40 cm   LV E/e' medial:  18.2 LV IVS:        1.45 cm   LV e' lateral:   7.29 cm/s LVOT diam:     2.05 cm   LV E/e' lateral: 12.0 LV SV:         71 LV SV Index:   41 LVOT Area:     3.30 cm  RIGHT VENTRICLE            IVC RV Basal diam:  3.40 cm    IVC diam: 2.20 cm RV S prime:     9.79 cm/s LEFT ATRIUM              Index        RIGHT ATRIUM           Index LA diam:        3.70 cm  2.17 cm/m   RA Area:     19.40 cm LA Vol (A2C):   110.0 ml 64.41 ml/m  RA Volume:   49.80 ml  29.16 ml/m LA Vol (A4C):   94.2 ml  55.16 ml/m LA Biplane Vol: 107.0 ml 62.65 ml/m  AORTIC VALVE                     PULMONIC VALVE AV Area (Vmax):    1.76 cm      PV Vmax:       0.92 m/s AV Area (Vmean):   1.62 cm      PV Peak grad:  3.4 mmHg AV Area (VTI):     1.80 cm AV Vmax:           169.00 cm/s AV Vmean:          121.000 cm/s AV VTI:            0.392 m AV Peak Grad:      11.4 mmHg AV Mean Grad:      6.0 mmHg LVOT Vmax:         90.10 cm/s LVOT Vmean:        59.500 cm/s LVOT VTI:          0.214 m LVOT/AV  VTI ratio: 0.55  AORTA Ao Root diam: 2.80 cm Ao Asc diam:  3.10 cm MITRAL VALVE                TRICUSPID VALVE MV Area (PHT): 3.17 cm    TR Peak grad:   68.2 mmHg MV Decel Time: 239 msec    TR Vmax:        413.00 cm/s MV E velocity: 87.40 cm/s MV A velocity: 93.40 cm/s  SHUNTS MV E/A ratio:  0.94        Systemic VTI:  0.21 m                            Systemic Diam: 2.05 cm Sanda Klein MD Electronically signed by Sanda Klein MD Signature Date/Time: 12/15/2020/2:10:11 PM    Final    VAS US CAROTID  Result Date: 12/07/2020 Carotid Arterial Duplex Study Patient Name:  Mandy [name]  Date of Exam:   12/07/2020 Medical Rec #: [id number]       Accession #:    [id number] Date of Birth: Dec 29, 1940        Patient Gender: F Patient Age:   43 years Exam Location:  [facility] Procedure:      VAS US CAROTID Referring Phys: Benjamine Mola MATHEWS --------------------------------------------------------------------------------  Indications:       CVA. Risk Factors:      Hypertension, hyperlipidemia, Diabetes. Comparison Study:  no prior Performing Technologist: Archie Patten RVS  Examination Guidelines: A complete evaluation includes B-mode imaging, spectral Doppler, color Doppler, and power Doppler as needed of all accessible portions of each vessel. Bilateral testing is considered an integral part of a complete examination. Limited examinations for reoccurring indications may be performed as noted.  Right Carotid Findings: +----------+--------+--------+--------+------------------+--------+             PSV cm/s EDV cm/s Stenosis Plaque Description Comments  +----------+--------+--------+--------+------------------+--------+  CCA Prox   62       13                heterogenous                 +----------+--------+--------+--------+------------------+--------+  CCA Distal 78       16                heterogenous                 +----------+--------+--------+--------+------------------+--------+  ICA Prox   66       12       1-39%    heterogenous                  +----------+--------+--------+--------+------------------+--------+  ICA Distal 75       25                                             +----------+--------+--------+--------+------------------+--------+  ECA        104                                                     +----------+--------+--------+--------+------------------+--------+ +----------+--------+-------+--------+-------------------+             PSV cm/s EDV cms Describe Arm  Pressure (mmHG)  +----------+--------+-------+--------+-------------------+  Subclavian 126                                            +----------+--------+-------+--------+-------------------+ +---------+--------+--+--------+--+---------+  Vertebral PSV cm/s 79 EDV cm/s 24 Antegrade  +---------+--------+--+--------+--+---------+  Left Carotid Findings: +----------+--------+--------+--------+------------------+--------+             PSV cm/s EDV cm/s Stenosis Plaque Description Comments  +----------+--------+--------+--------+------------------+--------+  CCA Prox   93       17                heterogenous                 +----------+--------+--------+--------+------------------+--------+  CCA Distal 106      32                heterogenous                 +----------+--------+--------+--------+------------------+--------+  ICA Prox   116      28       1-39%    heterogenous                 +----------+--------+--------+--------+------------------+--------+  ICA Distal 82       30                                             +----------+--------+--------+--------+------------------+--------+  ECA        114                                                     +----------+--------+--------+--------+------------------+--------+ +----------+--------+--------+--------+-------------------+             PSV cm/s EDV cm/s Describe Arm Pressure (mmHG)  +----------+--------+--------+--------+-------------------+  Subclavian 103                                              +----------+--------+--------+--------+-------------------+ +---------+--------+--+--------+--+---------+  Vertebral PSV cm/s 87 EDV cm/s 26 Antegrade  +---------+--------+--+--------+--+---------+   Summary: Right Carotid: Velocities in the right ICA are consistent with a 1-39% stenosis. Left Carotid: Velocities in the left ICA are consistent with a 1-39% stenosis. Vertebrals: Bilateral vertebral arteries demonstrate antegrade flow. *See table(s) above for measurements and observations.  Electronically signed by Antony Contras MD on 12/07/2020 at 5:04:27 PM.    Final    ECHOCARDIOGRAM LIMITED  Result Date: 11/24/2020    ECHOCARDIOGRAM LIMITED REPORT   Patient Name:   Mandy [name] Date of Exam: 11/24/2020 Medical Rec #:  [id number]      Height:       59.0 in Accession #:    [id number]     Weight:       168.9 lb Date of Birth:  1940/02/04       BSA:          1.717 m Patient Age:    18 years       BP:           171/57 mmHg Patient Gender: F  HR:           70 bpm. Exam Location:  Inpatient Procedure: 3D Echo, Limited Echo, Cardiac Doppler and Color Doppler Indications:    I50.40* Unspecified combined systolic (congestive) and diastolic                 (congestive) heart failure  History:        Patient has prior history of Echocardiogram examinations, most                 recent 11/04/2020. Signs/Symptoms:Shortness of Breath and                 Dyspnea; Risk Factors:Diabetes and Dyslipidemia.  Sonographer:    Roseanna Rainbow RDCS Referring Phys: [id number] [place]  1. Left ventricular ejection fraction, by estimation, is 55 to 60%. The left ventricle has normal function. There is mild left ventricular hypertrophy.  2. Right ventricular systolic function is mildly reduced. The right ventricular size is normal. There is severely elevated pulmonary artery systolic pressure. The estimated right ventricular systolic pressure is 99.3 mmHg.  3. Left atrial size was mild to moderately dilated.  4.  Right atrial size was mildly dilated.  5. The mitral valve is normal in structure. Mild to moderate mitral valve regurgitation. No evidence of mitral stenosis.  6. The aortic valve is tricuspid. Aortic valve regurgitation is not visualized. No aortic stenosis is present.  7. The inferior vena cava is dilated in size with <50% respiratory variability, suggesting right atrial pressure of 15 mmHg.  8. Left pleural effusion noted. FINDINGS  Left Ventricle: Left ventricular ejection fraction, by estimation, is 55 to 60%. The left ventricle has normal function. The left ventricular internal cavity size was normal in size. There is mild left ventricular hypertrophy. Right Ventricle: The right ventricular size is normal. Right ventricular systolic function is mildly reduced. There is severely elevated pulmonary artery systolic pressure. The tricuspid regurgitant velocity is 4.13 m/s, and with an assumed right atrial pressure of 15 mmHg, the estimated right ventricular systolic pressure is 71.6 mmHg. Left Atrium: Left atrial size was mild to moderately dilated. Right Atrium: Right atrial size was mildly dilated. Pericardium: Left pleural effusion noted. Trivial pericardial effusion is present. Mitral Valve: The mitral valve is normal in structure. Mild to moderate mitral valve regurgitation. No evidence of mitral valve stenosis. Tricuspid Valve: Tricuspid valve regurgitation is mild. Aortic Valve: The aortic valve is tricuspid. Aortic valve regurgitation is not visualized. No aortic stenosis is present. Aorta: The aortic root is normal in size and structure. Venous: The inferior vena cava is dilated in size with less than 50% respiratory variability, suggesting right atrial pressure of 15 mmHg. IAS/Shunts: No atrial level shunt detected by color flow Doppler. LEFT VENTRICLE PLAX 2D LVIDd:         4.50 cm LVIDs:         2.80 cm LV PW:         1.10 cm LV IVS:        1.20 cm LVOT diam:     2.20 cm     3D Volume EF: LVOT Area:      3.80 cm    3D EF:        67 %                            LV EDV:       90 ml  LV ESV:       30 ml LV Volumes (MOD)           LV SV:        60 ml LV vol d, MOD A2C: 68.0 ml LV vol d, MOD A4C: 78.4 ml LV vol s, MOD A2C: 29.3 ml LV vol s, MOD A4C: 30.4 ml LV SV MOD A2C:     38.7 ml LV SV MOD A4C:     78.4 ml LV SV MOD BP:      46.0 ml IVC IVC diam: 2.40 cm LEFT ATRIUM         Index LA diam:    3.60 cm 2.10 cm/m   AORTA Ao Root diam: 3.20 cm Ao Asc diam:  3.50 cm MITRAL VALVE                  TRICUSPID VALVE MV Area (PHT): 3.17 cm       TR Peak grad:   68.2 mmHg MV Decel Time: 239 msec       TR Vmax:        413.00 cm/s MR Peak grad:    110.7 mmHg MR Mean grad:    80.0 mmHg    SHUNTS MR Vmax:         526.00 cm/s  Systemic Diam: 2.20 cm MR Vmean:        427.0 cm/s MR PISA:         1.01 cm MR PISA Eff ROA: 7 mm MR PISA Radius:  0.40 cm MV E velocity: 117.00 cm/s MV A velocity: 95.50 cm/s MV E/A ratio:  1.23 Dalton McleanMD Electronically signed by Franki Monte Signature Date/Time: 11/24/2020/5:48:09 PM    Final    US THORACENTESIS ASP PLEURAL SPACE W/IMG GUIDE  Result Date: 12/15/2020 INDICATION: Patient with history of CHF, chronic hypoxic respiratory failure and recurrent pleural effusion. Request is for therapeutic thoracentesis EXAM: ULTRASOUND GUIDED THERAPEUTIC RIGHT SIDED THORACENTESIS MEDICATIONS: Lidocaine 1 % - 10 ml COMPLICATIONS: None immediate. PROCEDURE: An ultrasound guided thoracentesis was thoroughly discussed with the patient and questions answered. The benefits, risks, alternatives and complications were also discussed. The patient understands and wishes to proceed with the procedure. Written consent was obtained. Ultrasound was performed to localize and mark an adequate pocket of fluid in the right chest. The area was then prepped and draped in the normal sterile fashion. 1% Lidocaine was used for local anesthesia. Under ultrasound guidance a 6 Fr Safe-T-Centesis  catheter was introduced. Thoracentesis was performed. The catheter was removed and a dressing applied. The patient was unable to tolerate additional fluid removal. FINDINGS: A total of approximately 600 milliliters of straw colored fluid was removed. IMPRESSION: Successful ultrasound guided right sided therapeutics thoracentesis yielding 600 milliliters of pleural fluid. Read by Rushie Nyhan NP Electronically Signed   By: Markus Daft M.D.   On: 12/15/2020 16:51       Subjective: Patient is feeling better, dyspnea continue to improve, no chest pain, no nausea or vomiting, abdominal pain has improved.   Discharge Exam: Vitals:   12/20/20 2217 12/21/20 0445  BP: (!) 161/73 (!) 163/71  Pulse: 72 68  Resp: 17 18  Temp: 98.3 F (36.8 C) 98.2 F (36.8 C)  SpO2: 97% 100%   Vitals:   12/20/20 1216 12/20/20 2217 12/21/20 0445 12/21/20 0500  BP: (!) 145/62 (!) 161/73 (!) 163/71   Pulse: 72 72 68   Resp: 16 17 18    Temp: 98.2 F (36.8 C) 98.3  F (36.8 C) 98.2 F (36.8 C)   TempSrc: Oral Oral Oral   SpO2: 98% 97% 100%   Weight:    74.1 kg  Height:        General: Not in pain or dyspnea  Neurology: Awake and alert, non focal  E ENT: miavbuereld pallor, no icterus, oral mucosa moist Cardiovascular: No JVD. S1-S2 present, rhythmic, no gallops, rubs, or murmurs. No lower extremity edema. Pulmonary: positive breath sounds bilaterally, adequate air movement, no wheezing, rhonchi or rales. Gastrointestinal. Abdomen soft and non tender Skin. No rashes Musculoskeletal: no joint deformities   The results of significant diagnostics from this hospitalization (including imaging, microbiology, ancillary and laboratory) are listed below for reference.     Microbiology: Recent Results (from the past 240 hour(s))  Resp Panel by RT-PCR (Flu A&B, Covid) Nasopharyngeal Swab     Status: None   Collection Time: 12/14/20 11:34 AM   Specimen: Nasopharyngeal Swab; Nasopharyngeal(NP) swabs in vial  transport medium  Result Value Ref Range Status   SARS Coronavirus 2 by RT PCR NEGATIVE NEGATIVE Final    Comment: (NOTE) SARS-CoV-2 target nucleic acids are NOT DETECTED.  The SARS-CoV-2 RNA is generally detectable in upper respiratory specimens during the acute phase of infection. The lowest concentration of SARS-CoV-2 viral copies this assay can detect is 138 copies/mL. A negative result does not preclude SARS-Cov-2 infection and should not be used as the sole basis for treatment or other patient management decisions. A negative result may occur with  improper specimen collection/handling, submission of specimen other than nasopharyngeal swab, presence of viral mutation(s) within the areas targeted by this assay, and inadequate number of viral copies(<138 copies/mL). A negative result must be combined with clinical observations, patient history, and epidemiological information. The expected result is Negative.  Fact Sheet for Patients:  EntrepreneurPulse.com.au  Fact Sheet for Healthcare Providers:  IncredibleEmployment.be  This test is no t yet approved or cleared by the [place] FDA and  has been authorized for detection and/or diagnosis of SARS-CoV-2 by FDA under an Emergency Use Authorization (EUA). This EUA will remain  in effect (meaning this test can be used) for the duration of the COVID-19 declaration under Section 564(b)(1) of the Act, 21 U.S.C.section 360bbb-3(b)(1), unless the authorization is terminated  or revoked sooner.       Influenza A by PCR NEGATIVE NEGATIVE Final   Influenza B by PCR NEGATIVE NEGATIVE Final    Comment: (NOTE) The Xpert Xpress SARS-CoV-2/FLU/RSV plus assay is intended as an aid in the diagnosis of influenza from Nasopharyngeal swab specimens and should not be used as a sole basis for treatment. Nasal washings and aspirates are unacceptable for Xpert Xpress SARS-CoV-2/FLU/RSV testing.  Fact  Sheet for Patients: EntrepreneurPulse.com.au  Fact Sheet for Healthcare Providers: IncredibleEmployment.be  This test is not yet approved or cleared by the [place] FDA and has been authorized for detection and/or diagnosis of SARS-CoV-2 by FDA under an Emergency Use Authorization (EUA). This EUA will remain in effect (meaning this test can be used) for the duration of the COVID-19 declaration under Section 564(b)(1) of the Act, 21 U.S.C. section 360bbb-3(b)(1), unless the authorization is terminated or revoked.  Performed at [facility], [place] [address], [place], [place] [address]   MRSA Next Gen by PCR, Nasal     Status: None   Collection Time: 12/14/20  3:01 PM   Specimen: Nasal Mucosa; Nasal Swab  Result Value Ref Range Status   MRSA by PCR Next Gen  NOT DETECTED NOT DETECTED Final    Comment: (NOTE) The GeneXpert MRSA Assay (FDA approved for NASAL specimens only), is one component of a comprehensive MRSA colonization surveillance program. It is not intended to diagnose MRSA infection nor to guide or monitor treatment for MRSA infections. Test performance is not FDA approved in patients less than 57 years old. Performed at [facility], [place] [address], [place], [place] [address]      Labs: BNP (last 3 results) Recent Labs    11/03/20 1241 11/23/20 1145 12/14/20 1101  BNP 163.4* 414.8* 628.3*   Basic Metabolic Panel: Recent Labs  Lab 12/15/20 0311 12/16/20 0413 12/17/20 0413 12/18/20 1257 12/20/20 0425  NA 136 135 133* 134* 136  K 4.0 3.5 3.8 4.5 3.7  CL 92* 91* 91* 91* 90*  CO2 37* 35* 34* 35* 37*  GLUCOSE 105* 106* 123* 128* 102*  BUN 14 17 18 19 21   CREATININE 1.20* 1.22* 1.51* 1.28* 1.19*  CALCIUM 8.6* 8.7* 8.4* 8.8* 9.2   Liver Function Tests: Recent Labs  Lab 12/14/20 1101 12/15/20 0311  AST 19 15  ALT 10 9  ALKPHOS 37* 31*  BILITOT 0.6 0.5  PROT 6.9 6.3*  ALBUMIN  3.8 3.4*   No results for input(s): LIPASE, AMYLASE in the last 168 hours. No results for input(s): AMMONIA in the last 168 hours. CBC: Recent Labs  Lab 12/14/20 1101 12/15/20 0311 12/16/20 0413  WBC 5.6 4.5 5.3  NEUTROABS 4.1  --  3.3  HGB 8.2* 7.6* 8.8*  HCT 26.0* 24.1* 27.8*  MCV 83.3 83.1 83.7  PLT 351 321 339   Cardiac Enzymes: No results for input(s): CKTOTAL, CKMB, CKMBINDEX, TROPONINI in the last 168 hours. BNP: Invalid input(s): POCBNP CBG: Recent Labs  Lab 12/20/20 0728 12/20/20 1130 12/20/20 1657 12/21/20 0252 12/21/20 0729  GLUCAP 107* 198* 95 107* 100*   D-Dimer No results for input(s): DDIMER in the last 72 hours. Hgb A1c No results for input(s): HGBA1C in the last 72 hours. Lipid Profile No results for input(s): CHOL, HDL, LDLCALC, TRIG, CHOLHDL, LDLDIRECT in the last 72 hours. Thyroid function studies No results for input(s): TSH, T4TOTAL, T3FREE, THYROIDAB in the last 72 hours.  Invalid input(s): FREET3 Anemia work up No results for input(s): VITAMINB12, FOLATE, FERRITIN, TIBC, IRON, RETICCTPCT in the last 72 hours. Urinalysis    Component Value Date/Time   COLORURINE YELLOW 09/24/2020 1536   APPEARANCEUR HAZY (A) 09/24/2020 1536   LABSPEC 1.016 09/24/2020 1536   PHURINE 5.0 09/24/2020 1536   GLUCOSEU NEGATIVE 09/24/2020 1536   HGBUR NEGATIVE 09/24/2020 1536   BILIRUBINUR NEGATIVE 09/24/2020 1536   KETONESUR NEGATIVE 09/24/2020 1536   PROTEINUR NEGATIVE 09/24/2020 1536   NITRITE NEGATIVE 09/24/2020 1536   LEUKOCYTESUR TRACE (A) 09/24/2020 1536   Sepsis Labs Invalid input(s): PROCALCITONIN,  WBC,  LACTICIDVEN Microbiology Recent Results (from the past 240 hour(s))  Resp Panel by RT-PCR (Flu A&B, Covid) Nasopharyngeal Swab     Status: None   Collection Time: 12/14/20 11:34 AM   Specimen: Nasopharyngeal Swab; Nasopharyngeal(NP) swabs in vial transport medium  Result Value Ref Range Status   SARS Coronavirus 2 by RT PCR NEGATIVE NEGATIVE  Final    Comment: (NOTE) SARS-CoV-2 target nucleic acids are NOT DETECTED.  The SARS-CoV-2 RNA is generally detectable in upper respiratory specimens during the acute phase of infection. The lowest concentration of SARS-CoV-2 viral copies this assay can detect is 138 copies/mL. A negative result does not preclude SARS-Cov-2 infection and should  not be used as the sole basis for treatment or other patient management decisions. A negative result may occur with  improper specimen collection/handling, submission of specimen other than nasopharyngeal swab, presence of viral mutation(s) within the areas targeted by this assay, and inadequate number of viral copies(<138 copies/mL). A negative result must be combined with clinical observations, patient history, and epidemiological information. The expected result is Negative.  Fact Sheet for Patients:  EntrepreneurPulse.com.au  Fact Sheet for Healthcare Providers:  IncredibleEmployment.be  This test is no t yet approved or cleared by the [place] FDA and  has been authorized for detection and/or diagnosis of SARS-CoV-2 by FDA under an Emergency Use Authorization (EUA). This EUA will remain  in effect (meaning this test can be used) for the duration of the COVID-19 declaration under Section 564(b)(1) of the Act, 21 U.S.C.section 360bbb-3(b)(1), unless the authorization is terminated  or revoked sooner.       Influenza A by PCR NEGATIVE NEGATIVE Final   Influenza B by PCR NEGATIVE NEGATIVE Final    Comment: (NOTE) The Xpert Xpress SARS-CoV-2/FLU/RSV plus assay is intended as an aid in the diagnosis of influenza from Nasopharyngeal swab specimens and should not be used as a sole basis for treatment. Nasal washings and aspirates are unacceptable for Xpert Xpress SARS-CoV-2/FLU/RSV testing.  Fact Sheet for Patients: EntrepreneurPulse.com.au  Fact Sheet for Healthcare  Providers: IncredibleEmployment.be  This test is not yet approved or cleared by the [place] FDA and has been authorized for detection and/or diagnosis of SARS-CoV-2 by FDA under an Emergency Use Authorization (EUA). This EUA will remain in effect (meaning this test can be used) for the duration of the COVID-19 declaration under Section 564(b)(1) of the Act, 21 U.S.C. section 360bbb-3(b)(1), unless the authorization is terminated or revoked.  Performed at [facility], [place] [address], [place], [place] [address]   MRSA Next Gen by PCR, Nasal     Status: None   Collection Time: 12/14/20  3:01 PM   Specimen: Nasal Mucosa; Nasal Swab  Result Value Ref Range Status   MRSA by PCR Next Gen NOT DETECTED NOT DETECTED Final    Comment: (NOTE) The GeneXpert MRSA Assay (FDA approved for NASAL specimens only), is one component of a comprehensive MRSA colonization surveillance program. It is not intended to diagnose MRSA infection nor to guide or monitor treatment for MRSA infections. Test performance is not FDA approved in patients less than 76 years old. Performed at [facility], [place] [address], [place], [place] [address]      Time coordinating discharge: 45 minutes  SIGNED:   Tawni Millers, MD  [facility] 12/21/2020, 9:13 AM

## 2020-12-22 ENCOUNTER — Other Ambulatory Visit (HOSPITAL_COMMUNITY): Payer: Self-pay

## 2020-12-23 ENCOUNTER — Other Ambulatory Visit: Payer: Self-pay

## 2020-12-23 ENCOUNTER — Ambulatory Visit (INDEPENDENT_AMBULATORY_CARE_PROVIDER_SITE_OTHER): Payer: Medicare HMO

## 2020-12-23 DIAGNOSIS — I4891 Unspecified atrial fibrillation: Secondary | ICD-10-CM | POA: Diagnosis not present

## 2020-12-23 NOTE — Patient Outreach (Signed)
[facility]) Care Management  12/23/2020  Greer Wainright 1940-01-14 [id number]   EMMI- Stroke RED ON EMMI ALERT Day # 13 Date: 12/23/20 Red Alert Reason:  Martin Majestic to follow-up appointment?   No  Scheduled a follow-up appointment?   No    Outreach attempt: Telephone call to patient via pacific interpreters.  Spoke with daughter [name].  She states she has been answering EMMI calls as patient does not understand [place].  She states she has made patient follow up appointments. PCP on 01-05-20 and others next week.  She reports she takes patient to appointments.  Discussed [employer] transport as back up. She verbalized understanding.     Plan: RN CM will close case.    Jone Baseman, RN, MSN [facility] Care Management Care Management Coordinator Direct Line [phone] Toll Free: [phone]  Fax: [phone]

## 2020-12-23 NOTE — Patient Outreach (Signed)
Received an [facility] Stroke red flag notification. I have assigned Jon Billings, RN for follow up and determine if there are any Case Management needs.

## 2020-12-24 ENCOUNTER — Telehealth: Payer: Self-pay | Admitting: Pulmonary Disease

## 2020-12-25 ENCOUNTER — Telehealth: Payer: Medicare HMO | Admitting: Thoracic Surgery (Cardiothoracic Vascular Surgery)

## 2020-12-25 ENCOUNTER — Other Ambulatory Visit: Payer: Self-pay

## 2020-12-25 NOTE — Telephone Encounter (Signed)
Called and spoke with Melissa from [facility]. Per Lenna Sciara, when pt was in the hospital, an order was placed for an NIV but pt's insurance has denied it. Melissa sad that pt's insurance will approve BIPAP.  Stated to [facility] that pt is scheduled for an appt with Dr. Valeta Harms 12/29 and we would go ahead and pass this info along to him so that he can get things taken care of for pt once she is here for an appt and she verbalized understanding.  Melissa also said that an ONO will need to be done on oxygen so that pt could be tested once she is on BIPAP as well.  Routing all this to Dr. Valeta Harms so he is aware of what needs to be done when pt has her appt 12/29.

## 2020-12-31 ENCOUNTER — Other Ambulatory Visit: Payer: Self-pay

## 2020-12-31 ENCOUNTER — Ambulatory Visit (INDEPENDENT_AMBULATORY_CARE_PROVIDER_SITE_OTHER): Payer: Medicare HMO | Admitting: Pulmonary Disease

## 2020-12-31 ENCOUNTER — Encounter: Payer: Self-pay | Admitting: Pulmonary Disease

## 2020-12-31 VITALS — BP 132/66 | HR 91 | Temp 98.1°F | Ht 59.0 in | Wt 168.0 lb

## 2020-12-31 DIAGNOSIS — J90 Pleural effusion, not elsewhere classified: Secondary | ICD-10-CM | POA: Diagnosis not present

## 2020-12-31 DIAGNOSIS — R0602 Shortness of breath: Secondary | ICD-10-CM

## 2020-12-31 DIAGNOSIS — Z7902 Long term (current) use of antithrombotics/antiplatelets: Secondary | ICD-10-CM

## 2020-12-31 DIAGNOSIS — I272 Pulmonary hypertension, unspecified: Secondary | ICD-10-CM

## 2020-12-31 DIAGNOSIS — J9611 Chronic respiratory failure with hypoxia: Secondary | ICD-10-CM

## 2020-12-31 NOTE — Patient Instructions (Signed)
Thank you for visiting Dr. Valeta Harms at [facility]. Today we recommend the following:  Orders Placed This Encounter  Procedures   Pulmonary Function Test   Split night study   Return in about 6 weeks (around 02/10/2021) for w/ Dr. Silas Flood .    Please do your part to reduce the spread of COVID-19.

## 2020-12-31 NOTE — Progress Notes (Signed)
Synopsis: Referred in December 2022 for pleural effusion by [name], MD  Subjective:   PATIENT ID: Mandy [name] GENDER: female DOB: [birth year], MRN: [id number]  Chief Complaint  Patient presents with   Consult    thoracentesis    This is a 80 year old female, past medical history of hypertension, hyperlipidemia, diabetes.Patient was referred by Dr. Kipp Brood for evaluation of pleural effusion.  Patient was discharged from the hospital on 12/21/2020.  Patient was seen in the hospital for evaluation of bilateral pleural effusion.  Also seen by cardiology.  Patient was found to have acute on chronic congestive heart failure and moderate to severe pulmonary hypertension she had thoracentesis done in the past.  Had right and left heart catheterization.  11/30/2020 showed mid LAD 30% stenosis, distal LAD 30% stenosis.  Patient was found to have a mean pulmonary arterial pressure of 39, PVR of 3.44, transpulmonary gradient of 20 mmHg, mean wedge pressure of 18.  An LVEDP of 9.  Echo with left ventricular ejection fraction of 55 to 60%, estimated RVSP of 83.     Past Medical History:  Diagnosis Date   Aortic atherosclerosis ([place]) 10/08/2018   Arthritis    Diabetes mellitus without complication (HCC)    GERD (gastroesophageal reflux disease)    Hepatic steatosis 10/08/2018   Hyperlipidemia    Hypertension    Hyperthyroidism    Mass of right ovary 10/08/2018   Osteoporosis    Pneumonia    SBO (small bowel obstruction) ([place]) 08/09/2018   SBO (small bowel obstruction) ([place]) 08/09/2018     Family History  Problem Relation Age of Onset   Kidney disease Son    Colon cancer Neg Hx    Breast cancer Neg Hx      Past Surgical History:  Procedure Laterality Date   CATARACT EXTRACTION W/ INTRAOCULAR LENS  IMPLANT, BILATERAL     COLONOSCOPY     LEFT HEART CATH AND CORONARY ANGIOGRAPHY N/A 11/30/2020   Procedure: LEFT HEART CATH AND CORONARY ANGIOGRAPHY;  Surgeon: Early Osmond, MD;  Location: [facility];  Service: Cardiovascular;  Laterality: N/A;   RIGHT/LEFT HEART CATH AND CORONARY ANGIOGRAPHY N/A 11/30/2020   Procedure: RIGHT/LEFT HEART CATH AND CORONARY ANGIOGRAPHY;  Surgeon: Early Osmond, MD;  Location: [facility];  Service: Cardiovascular;  Laterality: N/A;    Social History   Socioeconomic History   Marital status: Widowed    Spouse name: Not on file   Number of children: Not on file   Years of education: Not on file   Highest education level: Not on file  Occupational History   Not on file  Tobacco Use   Smoking status: Never   Smokeless tobacco: Never  Vaping Use   Vaping Use: Never used  Substance and Sexual Activity   Alcohol use: No   Drug use: No   Sexual activity: Not on file  Other Topics Concern   Not on file  Social History Narrative   Patient is from [place]   Speaks [place]   Social Determinants of Health   Financial Resource Strain: Not on file  Food Insecurity: Not on file  Transportation Needs: Not on file  Physical Activity: Not on file  Stress: Not on file  Social Connections: Not on file  Intimate Partner Violence: Not on file     No Known Allergies   Outpatient Medications Prior to Visit  Medication Sig Dispense Refill   acetaminophen (TYLENOL) 500 MG tablet Take  1,000 mg by mouth every 6 (six) hours as needed for fever or headache (pain).     albuterol (PROVENTIL) (2.5 MG/3ML) 0.083% nebulizer solution Take 2.5 mg by nebulization every 6 (six) hours as needed for wheezing or shortness of breath.     albuterol (VENTOLIN HFA) 108 (90 Base) MCG/ACT inhaler Inhale 2 puffs into the lungs every 6 (six) hours as needed for wheezing or shortness of breath. 18 g 0   alendronate (FOSAMAX) 70 MG tablet Take 70 mg by mouth every Tuesday. Take with a full glass of water on an empty stomach.     atenolol (TENORMIN) 50 MG tablet Take 50 mg by mouth 2 (two) times daily.     calcium carbonate (OSCAL) 1500  (600 Ca) MG TABS tablet Take 600 mg of elemental calcium by mouth 2 (two) times daily with a meal.     clopidogrel (PLAVIX) 75 MG tablet Take 1 tablet (75 mg total) by mouth daily. 30 tablet 2   diazepam (VALIUM) 2 MG tablet Take 2 mg by mouth every 12 (twelve) hours as needed for anxiety.     diclofenac sodium (VOLTAREN) 1 % GEL Apply 4 g topically 4 (four) times daily as needed for pain. shoulder     gabapentin (NEURONTIN) 100 MG capsule Take 100 mg by mouth at bedtime.     hydrALAZINE (APRESOLINE) 100 MG tablet Take 0.5 tablets (50 mg total) by mouth 3 (three) times daily. 90 tablet 1   melatonin 5 MG TABS Take 10 mg by mouth at bedtime.     methimazole (TAPAZOLE) 5 MG tablet Take 0.5 tablets (2.5 mg total) by mouth daily. 30 tablet 0   Multiple Vitamin (MULTIVITAMIN WITH MINERALS) TABS tablet Take 1 tablet by mouth every morning.     Omega-3 Fatty Acids (FISH OIL PO) Take 1 capsule by mouth every morning.     OXYGEN Inhale 2-4 L into the lungs continuous.     pantoprazole (PROTONIX) 40 MG tablet Take 1 tablet (40 mg total) by mouth daily at 6 (six) AM. (Patient taking differently: Take 40 mg by mouth every morning.) 30 tablet 0   potassium chloride SA (KLOR-CON M) 20 MEQ tablet Take 1 tablet (20 mEq total) by mouth daily. 30 tablet 1   Pseudoeph-CPM-DM-APAP (THERAFLU FLU/COLD/COUGH PO) Take 30 mLs by mouth 2 (two) times daily as needed (cough/congestion).     simvastatin (ZOCOR) 40 MG tablet Take 1 tablet (40 mg total) by mouth at bedtime. 30 tablet 2   torsemide (DEMADEX) 20 MG tablet Take 2 tablets (40 mg total) by mouth daily. 60 tablet 0   vitamin B-12 (CYANOCOBALAMIN) 1000 MCG tablet Take 1,000 mcg by mouth daily.     vitamin C (ASCORBIC ACID) 500 MG tablet Take 500 mg by mouth daily.     Vitamin D, Cholecalciferol, 25 MCG (1000 UT) TABS Take 1,000 Units by mouth daily.     zinc gluconate 50 MG tablet Take 50 mg by mouth daily.     No facility-administered medications prior to visit.     Review of Systems  Constitutional:  Negative for chills, fever, malaise/fatigue and weight loss.  HENT:  Negative for hearing loss, sore throat and tinnitus.   Eyes:  Negative for blurred vision and double vision.  Respiratory:  Positive for shortness of breath. Negative for cough, hemoptysis, sputum production, wheezing and stridor.   Cardiovascular:  Positive for leg swelling. Negative for chest pain, palpitations, orthopnea and PND.  Gastrointestinal:  Negative for abdominal  pain, constipation, diarrhea, heartburn, nausea and vomiting.  Genitourinary:  Negative for dysuria, hematuria and urgency.  Musculoskeletal:  Negative for joint pain and myalgias.  Skin:  Negative for itching and rash.  Neurological:  Negative for dizziness, tingling, weakness and headaches.  Endo/Heme/Allergies:  Negative for environmental allergies. Does not bruise/bleed easily.  Psychiatric/Behavioral:  Negative for depression. The patient is not nervous/anxious and does not have insomnia.   All other systems reviewed and are negative.   Objective:  Physical Exam Vitals reviewed.  Constitutional:      General: She is not in acute distress.    Appearance: She is well-developed. She is obese.  HENT:     Head: Normocephalic and atraumatic.  Eyes:     General: No scleral icterus.    Conjunctiva/sclera: Conjunctivae normal.     Pupils: Pupils are equal, round, and reactive to light.  Neck:     Vascular: No JVD.     Trachea: No tracheal deviation.  Cardiovascular:     Rate and Rhythm: Normal rate and regular rhythm.     Heart sounds: Normal heart sounds. No murmur heard. Pulmonary:     Effort: Pulmonary effort is normal. No tachypnea, accessory muscle usage or respiratory distress.     Breath sounds: No stridor. No wheezing, rhonchi or rales.     Comments: Diminished bilateral bases Abdominal:     General: There is no distension.     Palpations: Abdomen is soft.     Tenderness: There is no  abdominal tenderness.  Musculoskeletal:        General: No tenderness.     Cervical back: Neck supple.  Lymphadenopathy:     Cervical: No cervical adenopathy.  Skin:    General: Skin is warm and dry.     Capillary Refill: Capillary refill takes less than 2 seconds.     Findings: No rash.  Neurological:     Mental Status: She is alert and oriented to person, place, and time.  Psychiatric:        Behavior: Behavior normal.     Vitals:   12/31/20 1116  BP: 132/66  Pulse: 91  Temp: 98.1 F (36.7 C)  TempSrc: Oral  SpO2: 98%  Weight: 168 lb (76.2 kg)  Height: 4\' 11"  (1.499 m)   98% on RA BMI Readings from Last 3 Encounters:  12/31/20 33.93 kg/m  12/21/20 32.99 kg/m  12/08/20 35.13 kg/m   Wt Readings from Last 3 Encounters:  12/31/20 168 lb (76.2 kg)  12/21/20 163 lb 5.8 oz (74.1 kg)  12/08/20 173 lb 15.1 oz (78.9 kg)     CBC    Component Value Date/Time   WBC 5.3 12/16/2020 0413   RBC 3.32 (L) 12/16/2020 0413   HGB 8.8 (L) 12/16/2020 0413   HCT 27.8 (L) 12/16/2020 0413   HCT 33.2 (L) 02/03/2019 1220   PLT 339 12/16/2020 0413   MCV 83.7 12/16/2020 0413   MCH 26.5 12/16/2020 0413   MCHC 31.7 12/16/2020 0413   RDW 16.6 (H) 12/16/2020 0413   LYMPHSABS 1.2 12/16/2020 0413   MONOABS 0.6 12/16/2020 0413   EOSABS 0.2 12/16/2020 0413   BASOSABS 0.0 12/16/2020 0413     Chest Imaging: 12/14/2020 CT chest: Right middle lobe lower lobe consolidation/collapse, compressive atelectasis from right moderate right-sided pleural effusion Enlargement of the pulmonary artery. The patient's images have been independently reviewed by me.    Pulmonary Functions Testing Results: No flowsheet data found.  FeNO:   Pathology:   Echocardiogram:  1. Left ventricular ejection fraction, by estimation, is 60 to 65%. The  left ventricle has normal function. The left ventricle has no regional  wall motion abnormalities. There is moderate concentric left ventricular   hypertrophy. Left ventricular  diastolic parameters are consistent with Grade II diastolic dysfunction  (pseudonormalization). Elevated left atrial pressure. There is the  interventricular septum is flattened in systole and diastole, consistent  with right ventricular pressure and volume   overload.   2. Right ventricular systolic function is mildly reduced. The right  ventricular size is normal. There is severely elevated pulmonary artery  systolic pressure.   3. Left atrial size was moderately dilated.   4. Right atrial size was mildly dilated.   5. The mitral valve is normal in structure. Trivial mitral valve  regurgitation. No evidence of mitral stenosis.   6. Tricuspid valve regurgitation is moderate.   7. The aortic valve is tricuspid. Aortic valve regurgitation is not  visualized. No aortic stenosis is present.   8. The inferior vena cava is dilated in size with <50% respiratory  variability, suggesting right atrial pressure of 15 mmHg.   Heart Catheterization:   11/30/2020   Mid LAD lesion is 30% stenosed.   Dist LAD lesion is 30% stenosed.   LV end diastolic pressure is low.   LV end diastolic pressure is normal.   The left ventricular ejection fraction is 55-65% by visual estimate.   Hemodynamic findings consistent with moderate pulmonary hypertension.   1.  Mild obstructive coronary artery disease. 2.  Preserved cardiac output of 6.1 L/min and an index of 3.55 L/min/m. 3.  Mean pulmonary artery pressure of 39 mmHg with pulmonary vascular resistance of 3.44 [place] units; transpulmonary gradient of 82mmHg (mean wedge pressure 76mmHg) 4.  LVEDP of 9 mmHg with preserved LVEF.      Assessment & Plan:     ICD-10-CM   1. Pulmonary hypertension (HCC)  I27.20 Pulmonary Function Test    Split night study    2. SOB (shortness of breath)  R06.02     3. Chronic hypoxemic respiratory failure (HCC)  J96.11     4. Bilateral pleural effusion  J90     5. Antiplatelet or  antithrombotic long-term use  Z79.02       Discussion:  This is a 80 year old female, bilateral pleural effusion, pulmonary hypertension.  Review echocardiogram and cath results as stated above.  She has not had a sleep study.  No history of clot.  Patient was referred to me for evaluation of pleural effusion.  Patient does have a small right-sided effusion left.  She recently had a drain during hospitalization.  She is also started on Plavix during the hospitalization therefore will not be having a thoracentesis today.Pleural effusion results demonstrated transudative fluid from recent [place] on 11/25/2020  Plan: Continue current diuretic regimen Continue Plavix history of stroke.  This was started recently Discussed case with Dr. Silas Flood, pulmonologist with special interest in pulmonary hypertension management. Will obtain pulmonary function test as well as a sleep study. Recently seen in the hospital by one of our partners Dr. Elsworth Soho who is a sleep physician which recommended a split-night study. I have ordered PFTs and a split-night study. Will have patient follow-up with Dr. Silas Flood in a few weeks after PFTs are complete. May need to consider initiation of pulmonary hypertension meds.    Current Outpatient Medications:    acetaminophen (TYLENOL) 500 MG tablet, Take 1,000 mg by mouth every 6 (six) hours as needed  for fever or headache (pain)., Disp: , Rfl:    albuterol (PROVENTIL) (2.5 MG/3ML) 0.083% nebulizer solution, Take 2.5 mg by nebulization every 6 (six) hours as needed for wheezing or shortness of breath., Disp: , Rfl:    albuterol (VENTOLIN HFA) 108 (90 Base) MCG/ACT inhaler, Inhale 2 puffs into the lungs every 6 (six) hours as needed for wheezing or shortness of breath., Disp: 18 g, Rfl: 0   alendronate (FOSAMAX) 70 MG tablet, Take 70 mg by mouth every Tuesday. Take with a full glass of water on an empty stomach., Disp: , Rfl:    atenolol (TENORMIN) 50 MG tablet, Take 50 mg  by mouth 2 (two) times daily., Disp: , Rfl:    calcium carbonate (OSCAL) 1500 (600 Ca) MG TABS tablet, Take 600 mg of elemental calcium by mouth 2 (two) times daily with a meal., Disp: , Rfl:    clopidogrel (PLAVIX) 75 MG tablet, Take 1 tablet (75 mg total) by mouth daily., Disp: 30 tablet, Rfl: 2   diazepam (VALIUM) 2 MG tablet, Take 2 mg by mouth every 12 (twelve) hours as needed for anxiety., Disp: , Rfl:    diclofenac sodium (VOLTAREN) 1 % GEL, Apply 4 g topically 4 (four) times daily as needed for pain. shoulder, Disp: , Rfl:    gabapentin (NEURONTIN) 100 MG capsule, Take 100 mg by mouth at bedtime., Disp: , Rfl:    hydrALAZINE (APRESOLINE) 100 MG tablet, Take 0.5 tablets (50 mg total) by mouth 3 (three) times daily., Disp: 90 tablet, Rfl: 1   melatonin 5 MG TABS, Take 10 mg by mouth at bedtime., Disp: , Rfl:    methimazole (TAPAZOLE) 5 MG tablet, Take 0.5 tablets (2.5 mg total) by mouth daily., Disp: 30 tablet, Rfl: 0   Multiple Vitamin (MULTIVITAMIN WITH MINERALS) TABS tablet, Take 1 tablet by mouth every morning., Disp: , Rfl:    Omega-3 Fatty Acids (FISH OIL PO), Take 1 capsule by mouth every morning., Disp: , Rfl:    OXYGEN, Inhale 2-4 L into the lungs continuous., Disp: , Rfl:    pantoprazole (PROTONIX) 40 MG tablet, Take 1 tablet (40 mg total) by mouth daily at 6 (six) AM. (Patient taking differently: Take 40 mg by mouth every morning.), Disp: 30 tablet, Rfl: 0   potassium chloride SA (KLOR-CON M) 20 MEQ tablet, Take 1 tablet (20 mEq total) by mouth daily., Disp: 30 tablet, Rfl: 1   Pseudoeph-CPM-DM-APAP (THERAFLU FLU/COLD/COUGH PO), Take 30 mLs by mouth 2 (two) times daily as needed (cough/congestion)., Disp: , Rfl:    simvastatin (ZOCOR) 40 MG tablet, Take 1 tablet (40 mg total) by mouth at bedtime., Disp: 30 tablet, Rfl: 2   torsemide (DEMADEX) 20 MG tablet, Take 2 tablets (40 mg total) by mouth daily., Disp: 60 tablet, Rfl: 0   vitamin B-12 (CYANOCOBALAMIN) 1000 MCG tablet, Take  1,000 mcg by mouth daily., Disp: , Rfl:    vitamin C (ASCORBIC ACID) 500 MG tablet, Take 500 mg by mouth daily., Disp: , Rfl:    Vitamin D, Cholecalciferol, 25 MCG (1000 UT) TABS, Take 1,000 Units by mouth daily., Disp: , Rfl:    zinc gluconate 50 MG tablet, Take 50 mg by mouth daily., Disp: , Rfl:   I spent 63 minutes dedicated to the care of this patient on the date of this encounter to include pre-visit review of records, face-to-face time with the patient discussing conditions above, post visit ordering of testing, clinical documentation with the electronic health record, making appropriate  referrals as documented, and communicating necessary findings to members of the patients care team.     Garner Nash, DO  Pulmonary Critical Care 12/31/2020 11:28 AM

## 2020-12-31 NOTE — Progress Notes (Deleted)
Synopsis: Referred in December 2022 for pleural effusion by [name], MD  Subjective:   PATIENT ID: Mandy [name] GENDER: female DOB: June 26, 1940, MRN: [id number]  Chief Complaint  Patient presents with   Consult    thoracentesis    This is a 80 year old female, past medical history of hypertension, hyperlipidemia, diabetes.Patient was referred by Dr. Kipp Brood for evaluation of pleural effusion.  Patient was discharged from the hospital on 12/21/2020.  Patient was seen in the hospital for evaluation of bilateral pleural effusion.  Also seen by cardiology.  Patient was found to have acute on chronic congestive heart failure and moderate to severe pulmonary hypertension she had thoracentesis done in the past.  Had right and left heart catheterization.  11/30/2020 showed mid LAD 30% stenosis, distal LAD 30% stenosis.  Patient was found to have a mean pulmonary arterial pressure of 39, PVR of 3.44, transpulmonary gradient of 20 mmHg, mean wedge pressure of 18.  An LVEDP of 9.  Echo with left ventricular ejection fraction of 55 to 60%, estimated RVSP of 83.     ***  Past Medical History:  Diagnosis Date   Aortic atherosclerosis ([place]) 10/08/2018   Arthritis    Diabetes mellitus without complication ([facility])    GERD (gastroesophageal reflux disease)    Hepatic steatosis 10/08/2018   Hyperlipidemia    Hypertension    Hyperthyroidism    Mass of right ovary 10/08/2018   Osteoporosis    Pneumonia    SBO (small bowel obstruction) ([place]) 08/09/2018   SBO (small bowel obstruction) ([place]) 08/09/2018     Family History  Problem Relation Age of Onset   Kidney disease Son    Colon cancer Neg Hx    Breast cancer Neg Hx      Past Surgical History:  Procedure Laterality Date   CATARACT EXTRACTION W/ INTRAOCULAR LENS  IMPLANT, BILATERAL     COLONOSCOPY     LEFT HEART CATH AND CORONARY ANGIOGRAPHY N/A 11/30/2020   Procedure: LEFT HEART CATH AND CORONARY ANGIOGRAPHY;  Surgeon:  Early Osmond, MD;  Location: [facility];  Service: Cardiovascular;  Laterality: N/A;   RIGHT/LEFT HEART CATH AND CORONARY ANGIOGRAPHY N/A 11/30/2020   Procedure: RIGHT/LEFT HEART CATH AND CORONARY ANGIOGRAPHY;  Surgeon: Early Osmond, MD;  Location: [facility];  Service: Cardiovascular;  Laterality: N/A;    Social History   Socioeconomic History   Marital status: Widowed    Spouse name: Not on file   Number of children: Not on file   Years of education: Not on file   Highest education level: Not on file  Occupational History   Not on file  Tobacco Use   Smoking status: Never   Smokeless tobacco: Never  Vaping Use   Vaping Use: Never used  Substance and Sexual Activity   Alcohol use: No   Drug use: No   Sexual activity: Not on file  Other Topics Concern   Not on file  Social History Narrative   Patient is from [place]   [place]   Social Determinants of Health   Financial Resource Strain: Not on file  Food Insecurity: Not on file  Transportation Needs: Not on file  Physical Activity: Not on file  Stress: Not on file  Social Connections: Not on file  Intimate Partner Violence: Not on file     No Known Allergies   Outpatient Medications Prior to Visit  Medication Sig Dispense Refill   acetaminophen (TYLENOL) 500 MG  tablet Take 1,000 mg by mouth every 6 (six) hours as needed for fever or headache (pain).     albuterol (PROVENTIL) (2.5 MG/3ML) 0.083% nebulizer solution Take 2.5 mg by nebulization every 6 (six) hours as needed for wheezing or shortness of breath.     albuterol (VENTOLIN HFA) 108 (90 Base) MCG/ACT inhaler Inhale 2 puffs into the lungs every 6 (six) hours as needed for wheezing or shortness of breath. 18 g 0   alendronate (FOSAMAX) 70 MG tablet Take 70 mg by mouth every Tuesday. Take with a full glass of water on an empty stomach.     atenolol (TENORMIN) 50 MG tablet Take 50 mg by mouth 2 (two) times daily.     calcium  carbonate (OSCAL) 1500 (600 Ca) MG TABS tablet Take 600 mg of elemental calcium by mouth 2 (two) times daily with a meal.     clopidogrel (PLAVIX) 75 MG tablet Take 1 tablet (75 mg total) by mouth daily. 30 tablet 2   diazepam (VALIUM) 2 MG tablet Take 2 mg by mouth every 12 (twelve) hours as needed for anxiety.     diclofenac sodium (VOLTAREN) 1 % GEL Apply 4 g topically 4 (four) times daily as needed for pain. shoulder     gabapentin (NEURONTIN) 100 MG capsule Take 100 mg by mouth at bedtime.     hydrALAZINE (APRESOLINE) 100 MG tablet Take 0.5 tablets (50 mg total) by mouth 3 (three) times daily. 90 tablet 1   melatonin 5 MG TABS Take 10 mg by mouth at bedtime.     methimazole (TAPAZOLE) 5 MG tablet Take 0.5 tablets (2.5 mg total) by mouth daily. 30 tablet 0   Multiple Vitamin (MULTIVITAMIN WITH MINERALS) TABS tablet Take 1 tablet by mouth every morning.     Omega-3 Fatty Acids (FISH OIL PO) Take 1 capsule by mouth every morning.     OXYGEN Inhale 2-4 L into the lungs continuous.     pantoprazole (PROTONIX) 40 MG tablet Take 1 tablet (40 mg total) by mouth daily at 6 (six) AM. (Patient taking differently: Take 40 mg by mouth every morning.) 30 tablet 0   potassium chloride SA (KLOR-CON M) 20 MEQ tablet Take 1 tablet (20 mEq total) by mouth daily. 30 tablet 1   Pseudoeph-CPM-DM-APAP (THERAFLU FLU/COLD/COUGH PO) Take 30 mLs by mouth 2 (two) times daily as needed (cough/congestion).     simvastatin (ZOCOR) 40 MG tablet Take 1 tablet (40 mg total) by mouth at bedtime. 30 tablet 2   torsemide (DEMADEX) 20 MG tablet Take 2 tablets (40 mg total) by mouth daily. 60 tablet 0   vitamin B-12 (CYANOCOBALAMIN) 1000 MCG tablet Take 1,000 mcg by mouth daily.     vitamin C (ASCORBIC ACID) 500 MG tablet Take 500 mg by mouth daily.     Vitamin D, Cholecalciferol, 25 MCG (1000 UT) TABS Take 1,000 Units by mouth daily.     zinc gluconate 50 MG tablet Take 50 mg by mouth daily.     No facility-administered  medications prior to visit.    ROS   Objective:  Physical Exam   Vitals:   12/31/20 1116  BP: 132/66  Pulse: 91  Temp: 98.1 F (36.7 C)  TempSrc: Oral  SpO2: 98%  Weight: 168 lb (76.2 kg)  Height: 4\' 11"  (1.499 m)   98% on *** LPM *** RA BMI Readings from Last 3 Encounters:  12/31/20 33.93 kg/m  12/21/20 32.99 kg/m  12/08/20 35.13 kg/m   Wt  Readings from Last 3 Encounters:  12/31/20 168 lb (76.2 kg)  12/21/20 163 lb 5.8 oz (74.1 kg)  12/08/20 173 lb 15.1 oz (78.9 kg)     CBC    Component Value Date/Time   WBC 5.3 12/16/2020 0413   RBC 3.32 (L) 12/16/2020 0413   HGB 8.8 (L) 12/16/2020 0413   HCT 27.8 (L) 12/16/2020 0413   HCT 33.2 (L) 02/03/2019 1220   PLT 339 12/16/2020 0413   MCV 83.7 12/16/2020 0413   MCH 26.5 12/16/2020 0413   MCHC 31.7 12/16/2020 0413   RDW 16.6 (H) 12/16/2020 0413   LYMPHSABS 1.2 12/16/2020 0413   MONOABS 0.6 12/16/2020 0413   EOSABS 0.2 12/16/2020 0413   BASOSABS 0.0 12/16/2020 0413    ***  Chest Imaging: ***  Pulmonary Functions Testing Results: No flowsheet data found.  FeNO: ***  Pathology: ***  Echocardiogram: ***  Heart Catheterization: ***    Assessment & Plan:   No diagnosis found.  Discussion: ***   Current Outpatient Medications:    acetaminophen (TYLENOL) 500 MG tablet, Take 1,000 mg by mouth every 6 (six) hours as needed for fever or headache (pain)., Disp: , Rfl:    albuterol (PROVENTIL) (2.5 MG/3ML) 0.083% nebulizer solution, Take 2.5 mg by nebulization every 6 (six) hours as needed for wheezing or shortness of breath., Disp: , Rfl:    albuterol (VENTOLIN HFA) 108 (90 Base) MCG/ACT inhaler, Inhale 2 puffs into the lungs every 6 (six) hours as needed for wheezing or shortness of breath., Disp: 18 g, Rfl: 0   alendronate (FOSAMAX) 70 MG tablet, Take 70 mg by mouth every Tuesday. Take with a full glass of water on an empty stomach., Disp: , Rfl:    atenolol (TENORMIN) 50 MG tablet, Take 50 mg by  mouth 2 (two) times daily., Disp: , Rfl:    calcium carbonate (OSCAL) 1500 (600 Ca) MG TABS tablet, Take 600 mg of elemental calcium by mouth 2 (two) times daily with a meal., Disp: , Rfl:    clopidogrel (PLAVIX) 75 MG tablet, Take 1 tablet (75 mg total) by mouth daily., Disp: 30 tablet, Rfl: 2   diazepam (VALIUM) 2 MG tablet, Take 2 mg by mouth every 12 (twelve) hours as needed for anxiety., Disp: , Rfl:    diclofenac sodium (VOLTAREN) 1 % GEL, Apply 4 g topically 4 (four) times daily as needed for pain. shoulder, Disp: , Rfl:    gabapentin (NEURONTIN) 100 MG capsule, Take 100 mg by mouth at bedtime., Disp: , Rfl:    hydrALAZINE (APRESOLINE) 100 MG tablet, Take 0.5 tablets (50 mg total) by mouth 3 (three) times daily., Disp: 90 tablet, Rfl: 1   melatonin 5 MG TABS, Take 10 mg by mouth at bedtime., Disp: , Rfl:    methimazole (TAPAZOLE) 5 MG tablet, Take 0.5 tablets (2.5 mg total) by mouth daily., Disp: 30 tablet, Rfl: 0   Multiple Vitamin (MULTIVITAMIN WITH MINERALS) TABS tablet, Take 1 tablet by mouth every morning., Disp: , Rfl:    Omega-3 Fatty Acids (FISH OIL PO), Take 1 capsule by mouth every morning., Disp: , Rfl:    OXYGEN, Inhale 2-4 L into the lungs continuous., Disp: , Rfl:    pantoprazole (PROTONIX) 40 MG tablet, Take 1 tablet (40 mg total) by mouth daily at 6 (six) AM. (Patient taking differently: Take 40 mg by mouth every morning.), Disp: 30 tablet, Rfl: 0   potassium chloride SA (KLOR-CON M) 20 MEQ tablet, Take 1 tablet (20 mEq  total) by mouth daily., Disp: 30 tablet, Rfl: 1   Pseudoeph-CPM-DM-APAP (THERAFLU FLU/COLD/COUGH PO), Take 30 mLs by mouth 2 (two) times daily as needed (cough/congestion)., Disp: , Rfl:    simvastatin (ZOCOR) 40 MG tablet, Take 1 tablet (40 mg total) by mouth at bedtime., Disp: 30 tablet, Rfl: 2   torsemide (DEMADEX) 20 MG tablet, Take 2 tablets (40 mg total) by mouth daily., Disp: 60 tablet, Rfl: 0   vitamin B-12 (CYANOCOBALAMIN) 1000 MCG tablet, Take 1,000  mcg by mouth daily., Disp: , Rfl:    vitamin C (ASCORBIC ACID) 500 MG tablet, Take 500 mg by mouth daily., Disp: , Rfl:    Vitamin D, Cholecalciferol, 25 MCG (1000 UT) TABS, Take 1,000 Units by mouth daily., Disp: , Rfl:    zinc gluconate 50 MG tablet, Take 50 mg by mouth daily., Disp: , Rfl:   I spent *** minutes dedicated to the care of this patient on the date of this encounter to include pre-visit review of records, face-to-face time with the patient discussing conditions above, post visit ordering of testing, clinical documentation with the electronic health record, making appropriate referrals as documented, and communicating necessary findings to members of the patients care team.   Garner Nash, [facility] 12/31/2020 11:18 AM

## 2021-01-01 ENCOUNTER — Telehealth: Payer: Medicare HMO | Admitting: Thoracic Surgery (Cardiothoracic Vascular Surgery)

## 2021-01-04 ENCOUNTER — Other Ambulatory Visit: Payer: Self-pay | Admitting: Internal Medicine

## 2021-01-06 LAB — CBC WITH DIFFERENTIAL/PLATELET
Absolute Monocytes: 663 cells/uL (ref 200–950)
Basophils Absolute: 39 cells/uL (ref 0–200)
Basophils Relative: 0.6 %
Eosinophils Absolute: 111 cells/uL (ref 15–500)
Eosinophils Relative: 1.7 %
HCT: 31.4 % — ABNORMAL LOW (ref 35.0–45.0)
Hemoglobin: 9.4 g/dL — ABNORMAL LOW (ref 11.7–15.5)
Lymphs Abs: 1976 cells/uL (ref 850–3900)
MCH: 26.2 pg — ABNORMAL LOW (ref 27.0–33.0)
MCHC: 29.9 g/dL — ABNORMAL LOW (ref 32.0–36.0)
MCV: 87.5 fL (ref 80.0–100.0)
MPV: 11.5 fL (ref 7.5–12.5)
Monocytes Relative: 10.2 %
Neutro Abs: 3712 cells/uL (ref 1500–7800)
Neutrophils Relative %: 57.1 %
Platelets: 374 10*3/uL (ref 140–400)
RBC: 3.59 10*6/uL — ABNORMAL LOW (ref 3.80–5.10)
RDW: 14.6 % (ref 11.0–15.0)
Total Lymphocyte: 30.4 %
WBC: 6.5 10*3/uL (ref 3.8–10.8)

## 2021-01-06 LAB — BASIC METABOLIC PANEL WITH GFR
BUN/Creatinine Ratio: 19 (calc) (ref 6–22)
BUN: 28 mg/dL — ABNORMAL HIGH (ref 7–25)
CO2: 35 mmol/L — ABNORMAL HIGH (ref 20–32)
Calcium: 9.6 mg/dL (ref 8.6–10.4)
Chloride: 95 mmol/L — ABNORMAL LOW (ref 98–110)
Creat: 1.5 mg/dL — ABNORMAL HIGH (ref 0.60–1.00)
Glucose, Bld: 108 mg/dL — ABNORMAL HIGH (ref 65–99)
Potassium: 4.8 mmol/L (ref 3.5–5.3)
Sodium: 139 mmol/L (ref 135–146)
eGFR: 35 mL/min/{1.73_m2} — ABNORMAL LOW (ref >=60)

## 2021-01-07 NOTE — Progress Notes (Signed)
Cardiology Clinic Note   Patient Name: Mandy [name] Date of Encounter: 01/08/2021  Primary Care Provider:  [name], MD Primary Cardiologist:  Mandy Heinz, MD  Patient Profile    81 y.o. 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    Past Medical History    Past Medical History:  Diagnosis Date   Aortic atherosclerosis (Mandy [name]) 10/08/2018   Arthritis    Diabetes mellitus without complication (HCC)    GERD (gastroesophageal reflux disease)    Hepatic steatosis 10/08/2018   Hyperlipidemia    Hypertension    Hyperthyroidism    Mass of right ovary 10/08/2018   Osteoporosis    Pneumonia    SBO (small bowel obstruction) ([place]) 08/09/2018   SBO (small bowel obstruction) ([place]) 08/09/2018   Past Surgical History:  Procedure Laterality Date   CATARACT EXTRACTION W/ INTRAOCULAR LENS  IMPLANT, BILATERAL     COLONOSCOPY     LEFT HEART CATH AND CORONARY ANGIOGRAPHY N/A 11/30/2020   Procedure: LEFT HEART CATH AND CORONARY ANGIOGRAPHY;  Surgeon: Mandy Osmond, MD;  Location: [facility];  Service: Cardiovascular;  Laterality: N/A;   RIGHT/LEFT HEART CATH AND CORONARY ANGIOGRAPHY N/A 11/30/2020   Procedure: RIGHT/LEFT HEART CATH AND CORONARY ANGIOGRAPHY;  Surgeon: Mandy Osmond, MD;  Location: [facility];  Service: Cardiovascular;  Laterality: N/A;    Allergies  No Known Allergies  History of Present Illness    Mandy [name] is seen today for ongoing assessment and management of pulmonary hypertension, nonobstructive CAD, recent admission for acute respiratory  insufficiency with acute on chronic CHF, requiring intubation.  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  This patient needs an interpreter unless her daughter is present.  She was found to have brief PAT's on telemetry, and was seen by electrophysiology with recommended 30-day monitor be placed.  Echocardiogram done during hospitalization revealed EF of 60 to 65%, RV with mild reduction in systolic function, severe elevation of systolic pulmonary pressure, RV and pressure and volume overload.  She was discharged on 12/21/2020.  She was taken off of Lasix and started on torsemide 40 mg daily continued on clopidogrel 75 mg daily and aspirin 81 mg daily.  She was also to continue hydralazine 50 mg 3 times daily and atenolol 50 mg twice daily.  She was placed on home oxygen at 3/liters.  During hospitalization on 12/15/2020 she will underwent an ultrasound-guided thoracentesis with removal of 600 mL of straw-colored fluid.  On discharge she continued to be very weak and deconditioned and home health nurse was requested.  Discharge weight 168 pounds on 12/31/2020.  She was seen on follow-up by pulmonologist Mandy [name], who planned pulmonary function tests and a sleep study.  No changes were made in her medication regimen.  She comes today with her daughter who serves as her interpreter.  The patient denies any significant shortness of breath but is very deconditioned.  She works with physical therapy and they have to turn her oxygen up to 4 L while she is walking with them.  She still is easily short of breath.  She denies any swelling, excessive coughing, abdominal distention.  She does have 3 pillow orthopnea.  Her daughter states that when she lays down flat her oxygen level dropped significantly.  They have been weighing her daily and her weight is remained stable within a couple of pounds each day up or  down.  She is still getting good result from her torsemide.  She denies any bleeding on Plavix.    Home Medications    Current Outpatient Medications  Medication Sig Dispense Refill   acetaminophen (TYLENOL) 500 MG tablet Take 1,000 mg by mouth every 6 (six) hours as needed for fever or headache (pain).     albuterol (PROVENTIL) (2.5 MG/3ML) 0.083% nebulizer solution Take 2.5 mg by nebulization every 6 (six) hours as needed for wheezing or shortness of breath.     albuterol (VENTOLIN HFA) 108 (90 Base) MCG/ACT inhaler Inhale 2 puffs into the lungs every 6 (six) hours as needed for wheezing or shortness of breath. 18 g 0   alendronate (FOSAMAX) 70 MG tablet Take 70 mg by mouth every Tuesday. Take with a full glass of water on an empty stomach.     atenolol (TENORMIN) 50 MG tablet Take 50 mg by mouth 2 (two) times daily.     calcium carbonate (OSCAL) 1500 (600 Ca) MG TABS tablet Take 600 mg of elemental calcium by mouth 2 (two) times daily with a meal.     cetirizine (ZYRTEC) 10 MG tablet Take 10 mg by mouth daily.     clopidogrel (PLAVIX) 75 MG tablet Take 1 tablet (75 mg total) by mouth daily. 30 tablet 2   diazepam (VALIUM) 2 MG tablet Take 2 mg by mouth every 12 (twelve) hours as needed for anxiety.     diclofenac sodium (VOLTAREN) 1 % GEL Apply 4 g topically 4 (four) times daily as needed for pain. shoulder     gabapentin (NEURONTIN) 100 MG capsule Take 100 mg by mouth at bedtime.     hydrALAZINE (APRESOLINE) 100 MG tablet Take 0.5 tablets (50 mg total) by mouth 3 (three) times daily. 90 tablet 1   melatonin 5 MG TABS Take 10 mg by mouth at bedtime.     methimazole (TAPAZOLE) 5 MG tablet Take 0.5 tablets (2.5 mg total) by mouth daily. 30 tablet 0   Multiple Vitamin (MULTIVITAMIN WITH MINERALS) TABS tablet Take 1 tablet by mouth every morning.     Omega-3 Fatty Acids (FISH OIL PO) Take 1 capsule by mouth every morning.     OXYGEN Inhale 2-4 L into the lungs continuous.     pantoprazole  (PROTONIX) 40 MG tablet Take 1 tablet (40 mg total) by mouth daily at 6 (six) AM. (Patient taking differently: Take 40 mg by mouth every morning.) 30 tablet 0   potassium chloride SA (KLOR-CON M) 20 MEQ tablet Take 1 tablet (20 mEq total) by mouth daily. 30 tablet 1   Pseudoeph-CPM-DM-APAP (THERAFLU FLU/COLD/COUGH PO) Take 30 mLs by mouth 2 (two) times daily as needed (cough/congestion).     simvastatin (ZOCOR) 40 MG tablet Take 1 tablet (40 mg total) by mouth at bedtime. 30 tablet 2   torsemide (DEMADEX) 20 MG tablet Take 2 tablets (40 mg total) by mouth daily. 60 tablet 0   vitamin B-12 (CYANOCOBALAMIN) 1000 MCG tablet Take 1,000 mcg by mouth daily.     vitamin C (ASCORBIC ACID) 500 MG tablet Take 500 mg by mouth daily.     Vitamin D, Cholecalciferol, 25 MCG (1000 UT) TABS  Take 1,000 Units by mouth daily.     zinc gluconate 50 MG tablet Take 50 mg by mouth daily.     No current facility-administered medications for this visit.     Family History    Family History  Problem Relation Age of Onset   Kidney disease Son    Colon cancer Neg Hx    Breast cancer Neg Hx    She indicated that her daughter is alive. She indicated that her son is alive. She indicated that the status of her neg hx is unknown.  Social History    Social History   Socioeconomic History   Marital status: Widowed    Spouse name: Not on file   Number of children: Not on file   Years of education: Not on file   Highest education level: Not on file  Occupational History   Not on file  Tobacco Use   Smoking status: Never   Smokeless tobacco: Never  Vaping Use   Vaping Use: Never used  Substance and Sexual Activity   Alcohol use: No   Drug use: No   Sexual activity: Not on file  Other Topics Concern   Not on file  Social History Narrative   Patient is from [place]   Speaks [place]rio   Social Determinants of Health   Financial Resource Strain: Not on file  Food Insecurity: Not on file  Transportation  Needs: Not on file  Physical Activity: Not on file  Stress: Not on file  Social Connections: Not on file  Intimate Partner Violence: Not on file     Review of Systems    General:  No chills, fever, night sweats or weight changes.  Cardiovascular:  No chest pain, dyspnea on exertion, edema, orthopnea, palpitations, paroxysmal nocturnal dyspnea. Dermatological: No rash, lesions/masses Respiratory: No cough, dyspnea Urologic: No hematuria, dysuria Abdominal:   No nausea, vomiting, diarrhea, bright red blood per rectum, melena, or hematemesis Neurologic:  No visual changes, wkns, changes in mental status. All other systems reviewed and are otherwise negative except as noted above.     Physical Exam    VS:  BP 140/74 (BP Location: Right Arm, Patient Position: Sitting)    Pulse 83    Ht 4\' 11"  (1.499 m)    Wt 165 lb (74.8 kg)    SpO2 98%    BMI 33.33 kg/m  , BMI Body mass index is 33.33 kg/m.     GEN: Well nourished, well developed, in no acute distress. HEENT: normal. Neck: Supple, no JVD, carotid bruits, or masses. Cardiac: RRR, no murmurs, rubs, or gallops. No clubbing, cyanosis, edema.  Radials/DP/PT 2+ and equal bilaterally.  Respiratory:  Respirations regular and unlabored, clear to auscultation bilaterally. GI: Soft, nontender, nondistended, BS + x 4. MS: no deformity or atrophy. Skin: warm and dry, no rash. Neuro:  Strength and sensation are intact. Psych: Normal affect.  Accessory Clinical Findings    ECG personally reviewed by me today-  Not done this visit.  Lab Results  Component Value Date   WBC 6.5 01/04/2021   HGB 9.4 (L) 01/04/2021   HCT 31.4 (L) 01/04/2021   MCV 87.5 01/04/2021   PLT 374 01/04/2021   Lab Results  Component Value Date   CREATININE 1.50 (H) 01/04/2021   BUN 28 (H) 01/04/2021   NA 139 01/04/2021   K 4.8 01/04/2021   CL 95 (L) 01/04/2021   CO2 35 (H) 01/04/2021   Lab Results  Component Value Date  ALT 9 12/15/2020   AST 15  12/15/2020   ALKPHOS 31 (L) 12/15/2020   BILITOT 0.5 12/15/2020   Lab Results  Component Value Date   CHOL 170 12/07/2020   HDL 53 12/07/2020   LDLCALC 107 (H) 12/07/2020   TRIG 50 12/07/2020   CHOLHDL 3.2 12/07/2020    Lab Results  Component Value Date   HGBA1C 5.7 (H) 12/07/2020    Review of Prior Studies: [facility] 11/30/20   Mid LAD lesion is 30% stenosed.   Dist LAD lesion is 30% stenosed.   LV end diastolic pressure is low.   LV end diastolic pressure is normal.   The left ventricular ejection fraction is 55-65% by visual estimate.   Hemodynamic findings consistent with moderate pulmonary hypertension.   1.  Mild obstructive coronary artery disease. 2.  Preserved cardiac output of 6.1 L/min and an index of 3.55 L/min/m. 3.  Mean pulmonary artery pressure of 39 mmHg with pulmonary vascular resistance of 3.44 Woods units; transpulmonary gradient of 42mmHg (mean wedge pressure 83mmHg) 4.  LVEDP of 9 mmHg with preserved LVEF.   Recommendation: Medical therapy.   Echo 11/24/20 1. Left ventricular ejection fraction, by estimation, is 55 to 60%. The left ventricle has normal function. There is mild left ventricular hypertrophy.   2. Right ventricular systolic function is mildly reduced. The right ventricular size is normal. There is severely elevated pulmonary artery systolic pressure. The estimated right ventricular systolic pressure is 40.9 mmHg.   3. Left atrial size was mild to moderately dilated.   4. Right atrial size was mildly dilated.   5. The mitral valve is normal in structure. Mild to moderate mitral valve  regurgitation. No evidence of mitral stenosis.   6. The aortic valve is tricuspid. Aortic valve regurgitation is not visualized. No aortic stenosis is present.   7. The inferior vena cava is dilated in size with <50% respiratory variability, suggesting right atrial pressure of 15 mmHg.   8. Left pleural effusion noted.   MR of Head 12/07/2020 IMPRESSION: 1.  Restricted diffusion in the right MCA territory, with associated increased T2 signal, which correlates with the findings seen on the 12/06/2020 CT head and is consistent with subacute infarct. 2. No intracranial large vessel occlusion. Multifocal narrowing in the intracranial ICAs, likely secondary to atherosclerotic plaque. In addition, there is possible focal stenosis proximally of a right MCA branch, which appears to supply the right anterior frontal lobe in the area of infarction.  Assessment & Plan   1.  Chronic HFrEF: Status post hospitalization for respiratory failure in the setting along with pulmonary hypertension and pleural effusion requiring thoracentesis.  She continues on p.o. torsemide with her weights remaining stable.  She denies any muscle cramping, dizziness, but does have 3 pillow orthopnea.  She is currently on O2 via nasal cannula at 3 L and sometimes increased to 4 L when she lays flat.  She will continue current medication regimen which includes heart rate control with atenolol, torsemide, daily weights and restricted salt diet.  2.  Coronary artery disease: Nonobstructive per cardiac catheterization, but evidence of pulmonary hypertension.  Secondary prevention with blood pressure control, statin therapy, and weight loss.  3.  Pulmonary hypertension: Severe by echocardiogram.  She has been diuresed and is also being followed by pulmonary.  Continue heart rate control and oxygen support  4.  Hypertension: Blood pressures currently well controlled remains on hydralazine, along with diuretic and atenolol.  She denies any side effects from these  medications and is medically compliant as her daughter takes care of her.  5.  History of cryptogenic CVA: : Remains on clopidogrel and is followed by neurology.  CT scan on 12/06/2020 revealed no new findings since previous exams with subacute infarction in the right frontal cortical and subcortical brain with no evidence of hemorrhage  or extension, and's chronic small vessel ischemic changes elsewhere. She denies any melena or hematuria.  Her daughter is watching this closely as she is on Plavix.No changes in regimen.  She is also being followed by EP and is wearing cardiac monitor to evaluate for atrial fibrillation.  Echocardiogram did not show evidence of PFO.  6.   Oxygen dependent respiratory insufficiency with pulmonary hypertension: Is to follow-up with pulmonology for evaluation to include PFTs.  Remains on oxygen and at a minimum of 3 L, sometimes 4 with exertion.  7.  Significant deconditioning: Physical therapy is working with her, she seems resistant to walking due to fatigue and shortness of breath.  I have encouraged her to continue to work with them to increase her stamina.    Current medicines are reviewed at length with the patient today.  I have spent 30 min's  dedicated to the care of this patient on the date of this encounter to include pre-visit review of records, assessment, management and diagnostic testing,with shared decision making.  Signed, Phill Myron. West Pugh, ANP, AACC   01/08/2021 3:53 PM    [facility] [place] [address] Office [phone] Fax [phone]  Notice: This dictation was prepared with Dragon dictation along with smaller phrase technology. Any transcriptional errors that result from this process are unintentional and may not be corrected upon review.

## 2021-01-08 ENCOUNTER — Other Ambulatory Visit: Payer: Self-pay

## 2021-01-08 ENCOUNTER — Encounter: Payer: Self-pay | Admitting: Adult Health

## 2021-01-08 ENCOUNTER — Ambulatory Visit (INDEPENDENT_AMBULATORY_CARE_PROVIDER_SITE_OTHER): Payer: Medicare HMO | Admitting: Adult Health

## 2021-01-08 VITALS — BP 140/74 | HR 83 | Ht 59.0 in | Wt 165.0 lb

## 2021-01-08 DIAGNOSIS — I272 Pulmonary hypertension, unspecified: Secondary | ICD-10-CM | POA: Diagnosis not present

## 2021-01-08 DIAGNOSIS — E669 Obesity, unspecified: Secondary | ICD-10-CM

## 2021-01-08 DIAGNOSIS — I2510 Atherosclerotic heart disease of native coronary artery without angina pectoris: Secondary | ICD-10-CM | POA: Diagnosis not present

## 2021-01-08 DIAGNOSIS — I10 Essential (primary) hypertension: Secondary | ICD-10-CM | POA: Diagnosis not present

## 2021-01-08 DIAGNOSIS — I5032 Chronic diastolic (congestive) heart failure: Secondary | ICD-10-CM

## 2021-01-08 DIAGNOSIS — J90 Pleural effusion, not elsewhere classified: Secondary | ICD-10-CM

## 2021-01-08 DIAGNOSIS — E7800 Pure hypercholesterolemia, unspecified: Secondary | ICD-10-CM

## 2021-01-08 DIAGNOSIS — R5381 Other malaise: Secondary | ICD-10-CM

## 2021-01-08 DIAGNOSIS — I63412 Cerebral infarction due to embolism of left middle cerebral artery: Secondary | ICD-10-CM

## 2021-01-08 NOTE — Patient Instructions (Signed)
Medication Instructions:  No Changes *If you need a refill on your cardiac medications before your next appointment, please call your pharmacy*   Lab Work: No Labs If you have labs (blood work) drawn today and your tests are completely normal, you will receive your results only by: [place] (if you have MyChart) OR A paper copy in the mail If you have any lab test that is abnormal or we need to change your treatment, we will call you to review the results.   Testing/Procedures: No Testing   Follow-Up: At [facility], you and your health needs are our priority.  As part of our continuing mission to provide you with exceptional heart care, we have created designated Provider Care Teams.  These Care Teams include your primary Cardiologist (physician) and Advanced Practice Providers (APPs -  Physician Assistants and Nurse Practitioners) who all work together to provide you with the care you need, when you need it.  We recommend signing up for the patient portal called "MyChart".  Sign up information is provided on this After Visit Summary.  MyChart is used to connect with patients for Virtual Visits (Telemedicine).  Patients are able to view lab/test results, encounter notes, upcoming appointments, etc.  Non-urgent messages can be sent to your provider as well.   To learn more about what you can do with MyChart, go to [email or web address].    Your next appointment:   3 month(s)  The format for your next appointment:   In Person  Provider:   Donato Heinz, MD     Other Instructions Take weight Daily .

## 2021-01-14 ENCOUNTER — Encounter: Payer: Self-pay | Admitting: Pulmonary Disease

## 2021-01-14 ENCOUNTER — Telehealth: Payer: Self-pay | Admitting: Pulmonary Disease

## 2021-01-14 ENCOUNTER — Ambulatory Visit (INDEPENDENT_AMBULATORY_CARE_PROVIDER_SITE_OTHER): Payer: Medicare HMO | Admitting: Pulmonary Disease

## 2021-01-14 ENCOUNTER — Other Ambulatory Visit: Payer: Self-pay

## 2021-01-14 VITALS — BP 120/70 | HR 86 | Temp 98.0°F | Ht 59.0 in | Wt 167.2 lb

## 2021-01-14 DIAGNOSIS — Z7902 Long term (current) use of antithrombotics/antiplatelets: Secondary | ICD-10-CM | POA: Diagnosis not present

## 2021-01-14 DIAGNOSIS — R0602 Shortness of breath: Secondary | ICD-10-CM | POA: Diagnosis not present

## 2021-01-14 DIAGNOSIS — I272 Pulmonary hypertension, unspecified: Secondary | ICD-10-CM

## 2021-01-14 DIAGNOSIS — J9611 Chronic respiratory failure with hypoxia: Secondary | ICD-10-CM | POA: Diagnosis not present

## 2021-01-14 DIAGNOSIS — J90 Pleural effusion, not elsewhere classified: Secondary | ICD-10-CM | POA: Diagnosis not present

## 2021-01-14 NOTE — Progress Notes (Signed)
Synopsis: Referred in December 2022 for pleural effusion by [name], MD  Subjective:   PATIENT ID: Mandy [name] GENDER: female DOB: January 02, 1941, MRN: [id number]  Chief Complaint  Patient presents with   Follow-up    Patient wants to talk about shortness of breath    This is a 81 year old female, past medical history of hypertension, hyperlipidemia, diabetes.Patient was referred by Dr. Kipp Brood for evaluation of pleural effusion.  Patient was discharged from the hospital on 12/21/2020.  Patient was seen in the hospital for evaluation of bilateral pleural effusion.  Also seen by cardiology.  Patient was found to have acute on chronic congestive heart failure and moderate to severe pulmonary hypertension she had thoracentesis done in the past.  Had right and left heart catheterization.  11/30/2020 showed mid LAD 30% stenosis, distal LAD 30% stenosis.  Patient was found to have a mean pulmonary arterial pressure of 39, PVR of 3.44, transpulmonary gradient of 20 mmHg, mean wedge pressure of 18.  An LVEDP of 9.  Echo with left ventricular ejection fraction of 55 to 60%, estimated RVSP of 83.    OV 01/14/2021: Here today for short-term follow-up.  Unfortunately had increased shortness of breath over the past couple of days.  Feels like she cannot get around as far.  Still requiring oxygen.  Just 1 to let us know that she felt like her shortness of breath was worse.  Afraid that she would need to go back to the hospital.   Past Medical History:  Diagnosis Date   Aortic atherosclerosis ([place]) 10/08/2018   Arthritis    Diabetes mellitus without complication (HCC)    GERD (gastroesophageal reflux disease)    Hepatic steatosis 10/08/2018   Hyperlipidemia    Hypertension    Hyperthyroidism    Mass of right ovary 10/08/2018   Osteoporosis    Pneumonia    SBO (small bowel obstruction) ([place]) 08/09/2018   SBO (small bowel obstruction) ([place]) 08/09/2018     Family History  Problem Relation  Age of Onset   Kidney disease Son    Colon cancer Neg Hx    Breast cancer Neg Hx      Past Surgical History:  Procedure Laterality Date   CATARACT EXTRACTION W/ INTRAOCULAR LENS  IMPLANT, BILATERAL     COLONOSCOPY     LEFT HEART CATH AND CORONARY ANGIOGRAPHY N/A 11/30/2020   Procedure: LEFT HEART CATH AND CORONARY ANGIOGRAPHY;  Surgeon: Early Osmond, MD;  Location: [facility];  Service: Cardiovascular;  Laterality: N/A;   RIGHT/LEFT HEART CATH AND CORONARY ANGIOGRAPHY N/A 11/30/2020   Procedure: RIGHT/LEFT HEART CATH AND CORONARY ANGIOGRAPHY;  Surgeon: Early Osmond, MD;  Location: [facility];  Service: Cardiovascular;  Laterality: N/A;    Social History   Socioeconomic History   Marital status: Widowed    Spouse name: Not on file   Number of children: Not on file   Years of education: Not on file   Highest education level: Not on file  Occupational History   Not on file  Tobacco Use   Smoking status: Never   Smokeless tobacco: Never  Vaping Use   Vaping Use: Never used  Substance and Sexual Activity   Alcohol use: No   Drug use: No   Sexual activity: Not on file  Other Topics Concern   Not on file  Social History Narrative   Patient is from [place]   [place]   Social Determinants of Health  Financial Resource Strain: Not on file  Food Insecurity: Not on file  Transportation Needs: Not on file  Physical Activity: Not on file  Stress: Not on file  Social Connections: Not on file  Intimate Partner Violence: Not on file     No Known Allergies   Outpatient Medications Prior to Visit  Medication Sig Dispense Refill   acetaminophen (TYLENOL) 500 MG tablet Take 1,000 mg by mouth every 6 (six) hours as needed for fever or headache (pain).     albuterol (PROVENTIL) (2.5 MG/3ML) 0.083% nebulizer solution Take 2.5 mg by nebulization every 6 (six) hours as needed for wheezing or shortness of breath.     albuterol (VENTOLIN HFA) 108 (90  Base) MCG/ACT inhaler Inhale 2 puffs into the lungs every 6 (six) hours as needed for wheezing or shortness of breath. 18 g 0   alendronate (FOSAMAX) 70 MG tablet Take 70 mg by mouth every Tuesday. Take with a full glass of water on an empty stomach.     atenolol (TENORMIN) 50 MG tablet Take 50 mg by mouth 2 (two) times daily.     calcium carbonate (OSCAL) 1500 (600 Ca) MG TABS tablet Take 600 mg of elemental calcium by mouth 2 (two) times daily with a meal.     cetirizine (ZYRTEC) 10 MG tablet Take 10 mg by mouth daily.     clopidogrel (PLAVIX) 75 MG tablet Take 1 tablet (75 mg total) by mouth daily. 30 tablet 2   diazepam (VALIUM) 2 MG tablet Take 2 mg by mouth every 12 (twelve) hours as needed for anxiety.     diclofenac sodium (VOLTAREN) 1 % GEL Apply 4 g topically 4 (four) times daily as needed for pain. shoulder     gabapentin (NEURONTIN) 100 MG capsule Take 100 mg by mouth at bedtime.     hydrALAZINE (APRESOLINE) 100 MG tablet Take 0.5 tablets (50 mg total) by mouth 3 (three) times daily. 90 tablet 1   melatonin 5 MG TABS Take 10 mg by mouth at bedtime.     methimazole (TAPAZOLE) 5 MG tablet Take 0.5 tablets (2.5 mg total) by mouth daily. 30 tablet 0   Multiple Vitamin (MULTIVITAMIN WITH MINERALS) TABS tablet Take 1 tablet by mouth every morning.     Omega-3 Fatty Acids (FISH OIL PO) Take 1 capsule by mouth every morning.     OXYGEN Inhale 2-4 L into the lungs continuous.     pantoprazole (PROTONIX) 40 MG tablet Take 1 tablet (40 mg total) by mouth daily at 6 (six) AM. (Patient taking differently: Take 40 mg by mouth every morning.) 30 tablet 0   potassium chloride SA (KLOR-CON M) 20 MEQ tablet Take 1 tablet (20 mEq total) by mouth daily. 30 tablet 1   Pseudoeph-CPM-DM-APAP (THERAFLU FLU/COLD/COUGH PO) Take 30 mLs by mouth 2 (two) times daily as needed (cough/congestion).     simvastatin (ZOCOR) 40 MG tablet Take 1 tablet (40 mg total) by mouth at bedtime. 30 tablet 2   torsemide (DEMADEX)  20 MG tablet Take 2 tablets (40 mg total) by mouth daily. 60 tablet 0   vitamin B-12 (CYANOCOBALAMIN) 1000 MCG tablet Take 1,000 mcg by mouth daily.     vitamin C (ASCORBIC ACID) 500 MG tablet Take 500 mg by mouth daily.     Vitamin D, Cholecalciferol, 25 MCG (1000 UT) TABS Take 1,000 Units by mouth daily.     zinc gluconate 50 MG tablet Take 50 mg by mouth daily.  No facility-administered medications prior to visit.    Review of Systems  Constitutional:  Negative for chills, fever, malaise/fatigue and weight loss.  HENT:  Negative for hearing loss, sore throat and tinnitus.   Eyes:  Negative for blurred vision and double vision.  Respiratory:  Positive for shortness of breath. Negative for cough, hemoptysis, sputum production, wheezing and stridor.   Cardiovascular:  Positive for leg swelling. Negative for chest pain, palpitations, orthopnea and PND.  Gastrointestinal:  Negative for abdominal pain, constipation, diarrhea, heartburn, nausea and vomiting.  Genitourinary:  Negative for dysuria, hematuria and urgency.  Musculoskeletal:  Negative for joint pain and myalgias.  Skin:  Negative for itching and rash.  Neurological:  Negative for dizziness, tingling, weakness and headaches.  Endo/Heme/Allergies:  Negative for environmental allergies. Does not bruise/bleed easily.  Psychiatric/Behavioral:  Negative for depression. The patient is not nervous/anxious and does not have insomnia.   All other systems reviewed and are negative.   Objective:  Physical Exam Vitals reviewed.  Constitutional:      General: She is not in acute distress.    Appearance: She is well-developed. She is obese.  HENT:     Head: Normocephalic and atraumatic.  Eyes:     General: No scleral icterus.    Conjunctiva/sclera: Conjunctivae normal.     Pupils: Pupils are equal, round, and reactive to light.  Neck:     Vascular: No JVD.     Trachea: No tracheal deviation.  Cardiovascular:     Rate and Rhythm:  Normal rate and regular rhythm.     Heart sounds: Normal heart sounds. No murmur heard. Pulmonary:     Effort: Pulmonary effort is normal. No tachypnea, accessory muscle usage or respiratory distress.     Breath sounds: No stridor. No wheezing, rhonchi or rales.  Abdominal:     General: Bowel sounds are normal. There is no distension.     Palpations: Abdomen is soft.     Tenderness: There is no abdominal tenderness.  Musculoskeletal:        General: No tenderness.     Cervical back: Neck supple.     Right lower leg: Edema present.     Left lower leg: Edema present.  Lymphadenopathy:     Cervical: No cervical adenopathy.  Skin:    General: Skin is warm and dry.     Capillary Refill: Capillary refill takes less than 2 seconds.     Findings: No rash.  Neurological:     Mental Status: She is alert and oriented to person, place, and time.  Psychiatric:        Behavior: Behavior normal.     Vitals:   01/14/21 1635  BP: 120/70  Pulse: 86  Temp: 98 F (36.7 C)  TempSrc: Oral  SpO2: 100%  Weight: 167 lb 3.2 oz (75.8 kg)  Height: '4\' 11"'  (1.499 m)   100% on RA BMI Readings from Last 3 Encounters:  01/14/21 33.77 kg/m  01/08/21 33.33 kg/m  12/31/20 33.93 kg/m   Wt Readings from Last 3 Encounters:  01/14/21 167 lb 3.2 oz (75.8 kg)  01/08/21 165 lb (74.8 kg)  12/31/20 168 lb (76.2 kg)     CBC    Component Value Date/Time   WBC 6.5 01/04/2021 1634   RBC 3.59 (L) 01/04/2021 1634   HGB 9.4 (L) 01/04/2021 1634   HCT 31.4 (L) 01/04/2021 1634   HCT 33.2 (L) 02/03/2019 1220   PLT 374 01/04/2021 1634   MCV 87.5 01/04/2021 1634  MCH 26.2 (L) 01/04/2021 1634   MCHC 29.9 (L) 01/04/2021 1634   RDW 14.6 01/04/2021 1634   LYMPHSABS 1,976 01/04/2021 1634   MONOABS 0.6 12/16/2020 0413   EOSABS 111 01/04/2021 1634   BASOSABS 39 01/04/2021 1634     Chest Imaging: 12/14/2020 CT chest: Right middle lobe lower lobe consolidation/collapse, compressive atelectasis from right  moderate right-sided pleural effusion Enlargement of the pulmonary artery. The patient's images have been independently reviewed by me.    Pulmonary Functions Testing Results: No flowsheet data found.  FeNO:   Pathology:   Echocardiogram:    1. Left ventricular ejection fraction, by estimation, is 60 to 65%. The  left ventricle has normal function. The left ventricle has no regional  wall motion abnormalities. There is moderate concentric left ventricular  hypertrophy. Left ventricular  diastolic parameters are consistent with Grade II diastolic dysfunction  (pseudonormalization). Elevated left atrial pressure. There is the  interventricular septum is flattened in systole and diastole, consistent  with right ventricular pressure and volume   overload.   2. Right ventricular systolic function is mildly reduced. The right  ventricular size is normal. There is severely elevated pulmonary artery  systolic pressure.   3. Left atrial size was moderately dilated.   4. Right atrial size was mildly dilated.   5. The mitral valve is normal in structure. Trivial mitral valve  regurgitation. No evidence of mitral stenosis.   6. Tricuspid valve regurgitation is moderate.   7. The aortic valve is tricuspid. Aortic valve regurgitation is not  visualized. No aortic stenosis is present.   8. The inferior vena cava is dilated in size with <50% respiratory  variability, suggesting right atrial pressure of 15 mmHg.   Heart Catheterization:   11/30/2020   Mid LAD lesion is 30% stenosed.   Dist LAD lesion is 30% stenosed.   LV end diastolic pressure is low.   LV end diastolic pressure is normal.   The left ventricular ejection fraction is 55-65% by visual estimate.   Hemodynamic findings consistent with moderate pulmonary hypertension.   1.  Mild obstructive coronary artery disease. 2.  Preserved cardiac output of 6.1 L/min and an index of 3.55 L/min/m. 3.  Mean pulmonary artery pressure of  39 mmHg with pulmonary vascular resistance of 3.44 Woods units; transpulmonary gradient of 20mHg (mean wedge pressure 163mg) 4.  LVEDP of 9 mmHg with preserved LVEF.      Assessment & Plan:     ICD-10-CM   1. Pulmonary hypertension (HCC)  I27.20 B Nat Peptide    Comp Met (CMET)    Comp Met (CMET)    B Nat Peptide    2. SOB (shortness of breath)  R06.02     3. Chronic hypoxemic respiratory failure (HCC)  J96.11     4. Bilateral pleural effusion  J90     5. Antiplatelet or antithrombotic long-term use  Z79.02       Discussion:  This is a [age]ear old female, bilateral pleural effusions, pulmonary hypertension seen a few weeks ago in clinic for posthospitalization follow-up.  Not yet had a sleep study no history of clot.  Started on diuretics with plans for establishing care with Dr. HuSilas Flood Unfortunately had progressive shortness of breath over the past few days and was seen in clinic for acute visit today.  Plan: Increase patient's diuretics, had her add an additional 20 mg of torsemide in the afternoon. She is known to see Dr. HuSilas Floodn a few weeks to consider  pulmonary hypertension med start. We had ordered a sleep study. PFTs ordered     Current Outpatient Medications:    acetaminophen (TYLENOL) 500 MG tablet, Take 1,000 mg by mouth every 6 (six) hours as needed for fever or headache (pain)., Disp: , Rfl:    albuterol (PROVENTIL) (2.5 MG/3ML) 0.083% nebulizer solution, Take 2.5 mg by nebulization every 6 (six) hours as needed for wheezing or shortness of breath., Disp: , Rfl:    albuterol (VENTOLIN HFA) 108 (90 Base) MCG/ACT inhaler, Inhale 2 puffs into the lungs every 6 (six) hours as needed for wheezing or shortness of breath., Disp: 18 g, Rfl: 0   alendronate (FOSAMAX) 70 MG tablet, Take 70 mg by mouth every Tuesday. Take with a full glass of water on an empty stomach., Disp: , Rfl:    atenolol (TENORMIN) 50 MG tablet, Take 50 mg by mouth 2 (two) times daily.,  Disp: , Rfl:    calcium carbonate (OSCAL) 1500 (600 Ca) MG TABS tablet, Take 600 mg of elemental calcium by mouth 2 (two) times daily with a meal., Disp: , Rfl:    cetirizine (ZYRTEC) 10 MG tablet, Take 10 mg by mouth daily., Disp: , Rfl:    clopidogrel (PLAVIX) 75 MG tablet, Take 1 tablet (75 mg total) by mouth daily., Disp: 30 tablet, Rfl: 2   diazepam (VALIUM) 2 MG tablet, Take 2 mg by mouth every 12 (twelve) hours as needed for anxiety., Disp: , Rfl:    diclofenac sodium (VOLTAREN) 1 % GEL, Apply 4 g topically 4 (four) times daily as needed for pain. shoulder, Disp: , Rfl:    gabapentin (NEURONTIN) 100 MG capsule, Take 100 mg by mouth at bedtime., Disp: , Rfl:    hydrALAZINE (APRESOLINE) 100 MG tablet, Take 0.5 tablets (50 mg total) by mouth 3 (three) times daily., Disp: 90 tablet, Rfl: 1   melatonin 5 MG TABS, Take 10 mg by mouth at bedtime., Disp: , Rfl:    methimazole (TAPAZOLE) 5 MG tablet, Take 0.5 tablets (2.5 mg total) by mouth daily., Disp: 30 tablet, Rfl: 0   Multiple Vitamin (MULTIVITAMIN WITH MINERALS) TABS tablet, Take 1 tablet by mouth every morning., Disp: , Rfl:    Omega-3 Fatty Acids (FISH OIL PO), Take 1 capsule by mouth every morning., Disp: , Rfl:    OXYGEN, Inhale 2-4 L into the lungs continuous., Disp: , Rfl:    pantoprazole (PROTONIX) 40 MG tablet, Take 1 tablet (40 mg total) by mouth daily at 6 (six) AM. (Patient taking differently: Take 40 mg by mouth every morning.), Disp: 30 tablet, Rfl: 0   potassium chloride SA (KLOR-CON M) 20 MEQ tablet, Take 1 tablet (20 mEq total) by mouth daily., Disp: 30 tablet, Rfl: 1   Pseudoeph-CPM-DM-APAP (THERAFLU FLU/COLD/COUGH PO), Take 30 mLs by mouth 2 (two) times daily as needed (cough/congestion)., Disp: , Rfl:    simvastatin (ZOCOR) 40 MG tablet, Take 1 tablet (40 mg total) by mouth at bedtime., Disp: 30 tablet, Rfl: 2   torsemide (DEMADEX) 20 MG tablet, Take 2 tablets (40 mg total) by mouth daily., Disp: 60 tablet, Rfl: 0   vitamin  B-12 (CYANOCOBALAMIN) 1000 MCG tablet, Take 1,000 mcg by mouth daily., Disp: , Rfl:    vitamin C (ASCORBIC ACID) 500 MG tablet, Take 500 mg by mouth daily., Disp: , Rfl:    Vitamin D, Cholecalciferol, 25 MCG (1000 UT) TABS, Take 1,000 Units by mouth daily., Disp: , Rfl:    zinc gluconate 50 MG tablet, Take  50 mg by mouth daily., Disp: , Rfl:   I spent 63 minutes dedicated to the care of this patient on the date of this encounter to include pre-visit review of records, face-to-face time with the patient discussing conditions above, post visit ordering of testing, clinical documentation with the electronic health record, making appropriate referrals as documented, and communicating necessary findings to members of the patients care team.     Garner Nash, DO [facility] 01/14/2021 4:48 PM

## 2021-01-14 NOTE — Telephone Encounter (Signed)
Mandy [name] states that her mom is SOB and has fluid on the lungs. She kept insisting pt see MH and did not want to schedule with APP because "they cant help." I tried to explain that Apps can do all the same things the doctors can, but she didnt want to see them. [facility] doesn't have availability until 1/31 at time of this message.  Mandy [name] hung up before I could get more information.

## 2021-01-14 NOTE — Telephone Encounter (Signed)
Spoke with the pt's daughter and scheduled appt with BI for today at 4:30 for eval of increased SOB. Daughter will be coming with her to this appt. Nothing further needed

## 2021-01-14 NOTE — Patient Instructions (Signed)
Thank you for visiting Dr. Valeta Harms at [facility]. Today we recommend the following:  Orders Placed This Encounter  Procedures   B Nat Peptide   Comp Met (CMET)   Torsemide take an extra 34m tablet in the afternoon at lunch time for the next week  Feb 1st to see Dr. HSilas Flood MD     Please do your part to reduce the spread of COVID-19.

## 2021-01-15 LAB — COMPREHENSIVE METABOLIC PANEL
ALT: 9 U/L (ref 0–35)
AST: 16 U/L (ref 0–37)
Albumin: 4.3 g/dL (ref 3.5–5.2)
Alkaline Phosphatase: 40 U/L (ref 39–117)
BUN: 29 mg/dL — ABNORMAL HIGH (ref 6–23)
CO2: 41 mEq/L — ABNORMAL HIGH (ref 19–32)
Calcium: 9.6 mg/dL (ref 8.4–10.5)
Chloride: 89 mEq/L — ABNORMAL LOW (ref 96–112)
Creatinine, Ser: 1.24 mg/dL — ABNORMAL HIGH (ref 0.40–1.20)
GFR: 41.31 mL/min — ABNORMAL LOW (ref >60.00)
Glucose, Bld: 119 mg/dL — ABNORMAL HIGH (ref 70–99)
Potassium: 4.1 mEq/L (ref 3.5–5.1)
Sodium: 137 mEq/L (ref 135–145)
Total Bilirubin: 0.3 mg/dL (ref 0.2–1.2)
Total Protein: 7.4 g/dL (ref 6.0–8.3)

## 2021-01-15 LAB — BRAIN NATRIURETIC PEPTIDE: Pro B Natriuretic peptide (BNP): 443 pg/mL — ABNORMAL HIGH (ref 0.0–100.0)

## 2021-01-18 ENCOUNTER — Other Ambulatory Visit (HOSPITAL_COMMUNITY): Payer: Self-pay

## 2021-01-19 ENCOUNTER — Telehealth: Payer: Self-pay | Admitting: Pulmonary Disease

## 2021-01-19 NOTE — Progress Notes (Signed)
Cardiology Office Note Date:  01/25/2021  Patient ID:  Mandy [name], Mandy [name] 03-15-1940, MRN [id number] PCP:  [name], MD  Cardiologist:  Dr. Gardiner Rhyme Electrophysiologist: new, case discussed with Dr. Rayann Heman in-patient    Chief Complaint:  hospital follow up, monitor findings   [place], family interprets given dialect not available with translation service  History of Present Illness: Mandy [name] is a 81 y.o. 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  was admitted on 11/23/2020 with with SOB, initially admitted for acute/chronic CHF, cardiology consulted for mod-severe pulmonary HTN and c/o CP, she had a thoracentesis done (as she has in the past) and had R/LHC,  no obstructive CAD noted and filling pressures optimized by cath, maintained on diuretics with recs for out patient follow up, cardiology signed off 12/01/20.   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  She was re-hospitalized again 12/14/20 -12/21/20 with recurrent/worsening SOB, treated for acute/chronic CHF, p.HTN, acute/chronic cor pulmonale, managed with the medicine team.  She had follow up with cardiology team, K. Lawrence, NP 01/08/21, requiring 4L O2 with exertion, still pretty SOB, no changes were made.  She mentions she was wearing the event monitor. She also has had f/u with pulmonary, Dr. Valeta Harms, pending further pulm testing planned Subsequent phone notes with  pulm team with increased O2 needs, her torsemide was increased some   [place] her daughter translates. O2 sats have been stable at 4L rest and ambulation, but gets SOB with ambulation still No near syncope or syncope They report good urine output No CP r cardiac concerns   Past Medical History:  Diagnosis Date   Aortic atherosclerosis ([place]) 10/08/2018   Arthritis    Diabetes mellitus without complication (HCC)    GERD (gastroesophageal reflux disease)    Hepatic steatosis 10/08/2018   Hyperlipidemia    Hypertension    Hyperthyroidism    Mass of right ovary 10/08/2018   Osteoporosis    Pneumonia    SBO (small bowel obstruction) ([place]) 08/09/2018   SBO (small bowel obstruction) ([place]) 08/09/2018    Past Surgical History:  Procedure Laterality Date   CATARACT EXTRACTION W/ INTRAOCULAR LENS  IMPLANT, BILATERAL     COLONOSCOPY     LEFT HEART CATH AND CORONARY ANGIOGRAPHY N/A 11/30/2020   Procedure: LEFT HEART CATH AND CORONARY ANGIOGRAPHY;  Surgeon: Early Osmond, MD;  Location: [facility];  Service: Cardiovascular;  Laterality: N/A;   RIGHT/LEFT HEART CATH AND CORONARY ANGIOGRAPHY N/A 11/30/2020   Procedure: RIGHT/LEFT HEART CATH AND CORONARY ANGIOGRAPHY;  Surgeon: Early Osmond, MD;  Location: [facility];  Service: Cardiovascular;  Laterality: N/A;    Current Outpatient Medications  Medication Sig Dispense Refill   acetaminophen (TYLENOL) 500 MG tablet Take 1,000 mg by mouth every 6 (six) hours as needed for fever or headache (pain).     albuterol (PROVENTIL) (2.5 MG/3ML) 0.083% nebulizer solution Take 2.5 mg by nebulization every 6 (six) hours as needed for wheezing or shortness of breath.  albuterol (VENTOLIN HFA) 108 (90 Base) MCG/ACT inhaler Inhale 2 puffs into the lungs every 6 (six) hours as needed for wheezing or shortness of breath. 18 g 0   alendronate (FOSAMAX) 70 MG tablet Take 70 mg by mouth every Tuesday. Take with a full glass of  water on an empty stomach.     atenolol (TENORMIN) 50 MG tablet Take 50 mg by mouth 2 (two) times daily.     calcium carbonate (OSCAL) 1500 (600 Ca) MG TABS tablet Take 600 mg of elemental calcium by mouth 2 (two) times daily with a meal.     cetirizine (ZYRTEC) 10 MG tablet Take 10 mg by mouth daily.     clopidogrel (PLAVIX) 75 MG tablet Take 1 tablet (75 mg total) by mouth daily. 30 tablet 2   diazepam (VALIUM) 2 MG tablet Take 2 mg by mouth every 12 (twelve) hours as needed for anxiety.     diclofenac sodium (VOLTAREN) 1 % GEL Apply 4 g topically 4 (four) times daily as needed for pain. shoulder     gabapentin (NEURONTIN) 100 MG capsule Take 100 mg by mouth at bedtime.     hydrALAZINE (APRESOLINE) 100 MG tablet Take 0.5 tablets (50 mg total) by mouth 3 (three) times daily. 90 tablet 1   melatonin 5 MG TABS Take 10 mg by mouth at bedtime.     methimazole (TAPAZOLE) 5 MG tablet Take 0.5 tablets (2.5 mg total) by mouth daily. 30 tablet 0   Multiple Vitamin (MULTIVITAMIN WITH MINERALS) TABS tablet Take 1 tablet by mouth every morning.     Omega-3 Fatty Acids (FISH OIL PO) Take 1 capsule by mouth every morning.     OXYGEN Inhale 2-4 L into the lungs continuous.     pantoprazole (PROTONIX) 40 MG tablet Take 1 tablet (40 mg total) by mouth daily at 6 (six) AM. (Patient taking differently: Take 40 mg by mouth every morning.) 30 tablet 0   potassium chloride SA (KLOR-CON M) 20 MEQ tablet Take 1 tablet (20 mEq total) by mouth daily. 30 tablet 1   Pseudoeph-CPM-DM-APAP (THERAFLU FLU/COLD/COUGH PO) Take 30 mLs by mouth 2 (two) times daily as needed (cough/congestion).     simvastatin (ZOCOR) 40 MG tablet Take 1 tablet (40 mg total) by mouth at bedtime. 30 tablet 2   vitamin B-12 (CYANOCOBALAMIN) 1000 MCG tablet Take 1,000 mcg by mouth daily.     vitamin C (ASCORBIC ACID) 500 MG tablet Take 500 mg by mouth daily.     Vitamin D, Cholecalciferol, 25 MCG (1000 UT) TABS Take 1,000 Units by mouth daily.      zinc gluconate 50 MG tablet Take 50 mg by mouth daily.     torsemide (DEMADEX) 20 MG tablet Take 2 tablets (40 mg total) by mouth daily. 60 tablet 0   No current facility-administered medications for this visit.    Allergies:   Patient has no known allergies.   Social History:  The patient  reports that she has never smoked. She has never used smokeless tobacco. She reports that she does not drink alcohol and does not use drugs.   Family History:  The patient's family history includes Kidney disease in her son.  ROS:  Please see the history of present illness.    All other systems are reviewed and otherwise negative.   PHYSICAL EXAM:  VS:  BP (!) 154/62    Pulse 77    Ht 4\' 11"  (1.499 m)    Wt 167 lb  6.4 oz (75.9 kg)    SpO2 100%    BMI 33.81 kg/m  BMI: Body mass index is 33.81 kg/m. Well nourished, well developed, in no acute distress HEENT: normocephalic, atraumatic Neck: no JVD, carotid bruits or masses Cardiac:  RRR; no significant murmurs, no rubs, or gallops Lungs:  she has crackles at both bases,  no wheezing, rhonchi or rales Abd: soft, nontender MS: no deformity or atrophy Ext: no edema Skin: warm and dry, no rash Neuro:  No gross deficits appreciated Psych: euthymic mood, full affect    EKG:  not done today   12/15/20; TTE IMPRESSIONS   1. Left ventricular ejection fraction, by estimation, is 60 to 65%. The  left ventricle has normal function. The left ventricle has no regional  wall motion abnormalities. There is moderate concentric left ventricular  hypertrophy. Left ventricular  diastolic parameters are consistent with Grade II diastolic dysfunction  (pseudonormalization). Elevated left atrial pressure. There is the  interventricular septum is flattened in systole and diastole, consistent  with right ventricular pressure and volume   overload.   2. Right ventricular systolic function is mildly reduced. The right  ventricular size is normal. There is  severely elevated pulmonary artery  systolic pressure.   3. Left atrial size was moderately dilated.   4. Right atrial size was mildly dilated.   5. The mitral valve is normal in structure. Trivial mitral valve  regurgitation. No evidence of mitral stenosis.   6. Tricuspid valve regurgitation is moderate.   7. The aortic valve is tricuspid. Aortic valve regurgitation is not  visualized. No aortic stenosis is present.   8. The inferior vena cava is dilated in size with <50% respiratory  variability, suggesting right atrial pressure of 15 mmHg.   Comparison(s): No significant change from prior study. Prior images  reviewed side by side.    [facility] 11/30/20   Mid LAD lesion is 30% stenosed.   Dist LAD lesion is 30% stenosed.   LV end diastolic pressure is low.   LV end diastolic pressure is normal.   The left ventricular ejection fraction is 55-65% by visual estimate.   Hemodynamic findings consistent with moderate pulmonary hypertension.   1.  Mild obstructive coronary artery disease. 2.  Preserved cardiac output of 6.1 L/min and an index of 3.55 L/min/m. 3.  Mean pulmonary artery pressure of 39 mmHg with pulmonary vascular resistance of 3.44 [place] units; transpulmonary gradient of 77mmHg (mean wedge pressure 74mmHg) 4.  LVEDP of 9 mmHg with preserved LVEF.   Recommendation: Medical therapy.   Echo 11/24/20 1. Left ventricular ejection fraction, by estimation, is 55 to 60%. The left ventricle has normal function. There is mild left ventricular hypertrophy.   2. Right ventricular systolic function is mildly reduced. The right ventricular size is normal. There is severely elevated pulmonary artery systolic pressure. The estimated right ventricular systolic pressure is 56.3 mmHg.   3. Left atrial size was mild to moderately dilated.   4. Right atrial size was mildly dilated.   5. The mitral valve is normal in structure. Mild to moderate mitral valve  regurgitation. No evidence of  mitral stenosis.   6. The aortic valve is tricuspid. Aortic valve regurgitation is not visualized. No aortic stenosis is present.   7. The inferior vena cava is dilated in size with <50% respiratory variability, suggesting right atrial pressure of 15 mmHg.   8. Left pleural effusion noted.   Recent Labs: 11/25/2020: TSH 2.871 12/02/2020: Magnesium 2.0 12/14/2020: B  Natriuretic Peptide 731.7 01/04/2021: Hemoglobin 9.4; Platelets 374 01/14/2021: ALT 9; BUN 29; Creatinine, Ser 1.24; Potassium 4.1; Pro B Natriuretic peptide (BNP) 443.0; Sodium 137  12/07/2020: Cholesterol 170; HDL 53; LDL Cholesterol 107; Total CHOL/HDL Ratio 3.2; Triglycerides 50; VLDL 10   Estimated Creatinine Clearance: 32.7 mL/min (A) (by C-G formula based on SCr of 1.24 mg/dL (H)).   Wt Readings from Last 3 Encounters:  01/25/21 167 lb 6.4 oz (75.9 kg)  01/14/21 167 lb 3.2 oz (75.8 kg)  01/08/21 165 lb (74.8 kg)     Other studies reviewed: Additional studies/records reviewed today include: summarized above  ASSESSMENT AND PLAN:  Cryptogenic stroke Her monitor has not been formally read In my review, I do not see any Afib, looks like she had perhaps ST vs an AT only, not associated with symptoms, 110's  Discussed surveillance for Afib given cryptogenic stroke Re-discussed procedure For now, they would like to hold off, continue f/u with pulm and Dr. Gardiner Rhyme, they will discuss loop more with him  Chronic hypoxic respiratory failure Home O2 Chronic CHF (diastolic) P.HTN Following with general cardiology team as well as pulmonology She has recent increase in O2 requirements, has crackles at both bases, moving air well otherwise. O2 sat on 4L is 100% She is not in distress BNP last week was elevated Will further increase her torsemide to 40mg  BID, f/u labs She sees pulm again soon for follow up   Disposition: F/u with Pulmonary as scheduled as well as Dr. Gardiner Rhyme, EP will see her back PRN should they decide  to pursue loop implant  Current medicines are reviewed at length with the patient today.  The patient did not have any concerns regarding medicines.  Venetia Night, PA-C 01/25/2021 1:57 PM     [address] [place] [id number] [phone] (office)  [phone] (fax)

## 2021-01-19 NOTE — Telephone Encounter (Signed)
Spoke with pt's daughter [name] (per [facility]) who stated pt's O2 sats where anywhere between 6 - 94% on 2L at rest. I instructed daughter that O2 sats should stay above 88% at all times even if that meant she had to increase pt's O2. Daughter stated that she did increase it to 4 L cont with which pt's O2 sat readings were 94%. Daughter just wanted Dr. Valeta Harms to know.   Routing to Dr. Valeta Harms as Juluis Rainier. Nothing further needed at this time.

## 2021-01-20 NOTE — Telephone Encounter (Signed)
Spoke to patient's Mandy [name](DPR). [name] is aware of below message/recommendations and voiced her understanding.  She stated that patient is currently taking 60mg  of Lasix daily. 40mg  in the morning and 20mg  at night.   Routing to Dr. Valeta Harms as an Juluis Rainier.

## 2021-01-20 NOTE — Telephone Encounter (Signed)
Noted  

## 2021-01-25 ENCOUNTER — Other Ambulatory Visit: Payer: Self-pay

## 2021-01-25 ENCOUNTER — Ambulatory Visit (INDEPENDENT_AMBULATORY_CARE_PROVIDER_SITE_OTHER): Payer: Medicare HMO | Admitting: Physician Assistant

## 2021-01-25 ENCOUNTER — Encounter: Payer: Self-pay | Admitting: Physician Assistant

## 2021-01-25 VITALS — BP 154/62 | HR 77 | Ht 59.0 in | Wt 167.4 lb

## 2021-01-25 DIAGNOSIS — I5032 Chronic diastolic (congestive) heart failure: Secondary | ICD-10-CM | POA: Diagnosis not present

## 2021-01-25 DIAGNOSIS — I639 Cerebral infarction, unspecified: Secondary | ICD-10-CM | POA: Diagnosis not present

## 2021-01-25 DIAGNOSIS — Z79899 Other long term (current) drug therapy: Secondary | ICD-10-CM

## 2021-01-25 DIAGNOSIS — I272 Pulmonary hypertension, unspecified: Secondary | ICD-10-CM

## 2021-01-25 MED ORDER — TORSEMIDE 40 MG PO TABS
40.0000 mg | ORAL_TABLET | Freq: Two times a day (BID) | ORAL | 1 refills | Status: DC
Start: 2021-01-25 — End: 2021-02-22

## 2021-01-25 NOTE — Patient Instructions (Addendum)
Medication Instructions:    START TAKING: TORSEMIDE 40 MG TWICE A DAY    *If you need a refill on your cardiac medications before your next appointment, please call your pharmacy*   Lab Work:  BMET [place] BMET   If you have labs (blood work) drawn today and your tests are completely normal, you will receive your results only by: [place] (if you have MyChart) OR A paper copy in the mail If you have any lab test that is abnormal or we need to change your treatment, we will call you to review the results.   Testing/Procedures:  NONE ORDERED  TODAY     Follow-Up: At [facility], you and your health needs are our priority.  As part of our continuing mission to provide you with exceptional heart care, we have created designated Provider Care Teams.  These Care Teams include your primary Cardiologist (physician) and Advanced Practice Providers (APPs -  Physician Assistants and Nurse Practitioners) who all work together to provide you with the care you need, when you need it.  We recommend signing up for the patient portal called "MyChart".  Sign up information is provided on this After Visit Summary.  MyChart is used to connect with patients for Virtual Visits (Telemedicine).  Patients are able to view lab/test results, encounter notes, upcoming appointments, etc.  Non-urgent messages can be sent to your provider as well.   To learn more about what you can do with MyChart, go to [email or web address].    Your next appointment:  AS SCHEDULED }    Other Instructions

## 2021-01-26 LAB — BASIC METABOLIC PANEL
BUN/Creatinine Ratio: 27 (ref 12–28)
BUN: 33 mg/dL — ABNORMAL HIGH (ref 8–27)
CO2: 40 mmol/L — ABNORMAL HIGH (ref 20–29)
Calcium: 9.5 mg/dL (ref 8.7–10.3)
Chloride: 90 mmol/L — ABNORMAL LOW (ref 96–106)
Creatinine, Ser: 1.21 mg/dL — ABNORMAL HIGH (ref 0.57–1.00)
Glucose: 115 mg/dL — ABNORMAL HIGH (ref 70–99)
Potassium: 4.4 mmol/L (ref 3.5–5.2)
Sodium: 143 mmol/L (ref 134–144)
eGFR: 46 mL/min/{1.73_m2} — ABNORMAL LOW (ref >59)

## 2021-01-31 ENCOUNTER — Encounter (HOSPITAL_BASED_OUTPATIENT_CLINIC_OR_DEPARTMENT_OTHER): Payer: Medicare HMO | Admitting: Pulmonary Disease

## 2021-02-02 ENCOUNTER — Other Ambulatory Visit: Payer: Self-pay

## 2021-02-02 ENCOUNTER — Ambulatory Visit (HOSPITAL_BASED_OUTPATIENT_CLINIC_OR_DEPARTMENT_OTHER): Payer: Medicare HMO | Attending: Pulmonary Disease | Admitting: Pulmonary Disease

## 2021-02-02 ENCOUNTER — Inpatient Hospital Stay (HOSPITAL_COMMUNITY)
Admission: EM | Admit: 2021-02-02 | Discharge: 2021-02-22 | DRG: 280 | Disposition: A | Discharge status: Home Health | Payer: Medicare HMO | Source: Ambulatory Visit | Attending: Family Medicine | Admitting: Family Medicine

## 2021-02-02 ENCOUNTER — Ambulatory Visit: Payer: Medicare HMO | Admitting: Pulmonary Disease

## 2021-02-02 DIAGNOSIS — I2781 Cor pulmonale (chronic): Secondary | ICD-10-CM | POA: Diagnosis present

## 2021-02-02 DIAGNOSIS — I21A1 Myocardial infarction type 2: Secondary | ICD-10-CM | POA: Diagnosis present

## 2021-02-02 DIAGNOSIS — R55 Syncope and collapse: Secondary | ICD-10-CM | POA: Diagnosis not present

## 2021-02-02 DIAGNOSIS — Z5320 Procedure and treatment not carried out because of patient's decision for unspecified reasons: Secondary | ICD-10-CM | POA: Diagnosis not present

## 2021-02-02 DIAGNOSIS — E875 Hyperkalemia: Secondary | ICD-10-CM | POA: Diagnosis present

## 2021-02-02 DIAGNOSIS — I714 Abdominal aortic aneurysm, without rupture, unspecified: Secondary | ICD-10-CM | POA: Diagnosis present

## 2021-02-02 DIAGNOSIS — Z6832 Body mass index (BMI) 32.0-32.9, adult: Secondary | ICD-10-CM

## 2021-02-02 DIAGNOSIS — I700 Atherosclerosis of aorta: Secondary | ICD-10-CM | POA: Diagnosis present

## 2021-02-02 DIAGNOSIS — Z79899 Other long term (current) drug therapy: Secondary | ICD-10-CM

## 2021-02-02 DIAGNOSIS — Z8673 Personal history of transient ischemic attack (TIA), and cerebral infarction without residual deficits: Secondary | ICD-10-CM

## 2021-02-02 DIAGNOSIS — R0602 Shortness of breath: Secondary | ICD-10-CM

## 2021-02-02 DIAGNOSIS — T502X5A Adverse effect of carbonic-anhydrase inhibitors, benzothiadiazides and other diuretics, initial encounter: Secondary | ICD-10-CM | POA: Diagnosis not present

## 2021-02-02 DIAGNOSIS — J939 Pneumothorax, unspecified: Secondary | ICD-10-CM

## 2021-02-02 DIAGNOSIS — J811 Chronic pulmonary edema: Secondary | ICD-10-CM

## 2021-02-02 DIAGNOSIS — Z9981 Dependence on supplemental oxygen: Secondary | ICD-10-CM

## 2021-02-02 DIAGNOSIS — E039 Hypothyroidism, unspecified: Secondary | ICD-10-CM | POA: Diagnosis present

## 2021-02-02 DIAGNOSIS — J9621 Acute and chronic respiratory failure with hypoxia: Secondary | ICD-10-CM | POA: Diagnosis present

## 2021-02-02 DIAGNOSIS — E119 Type 2 diabetes mellitus without complications: Secondary | ICD-10-CM

## 2021-02-02 DIAGNOSIS — E876 Hypokalemia: Secondary | ICD-10-CM | POA: Diagnosis not present

## 2021-02-02 DIAGNOSIS — I272 Pulmonary hypertension, unspecified: Secondary | ICD-10-CM

## 2021-02-02 DIAGNOSIS — J9690 Respiratory failure, unspecified, unspecified whether with hypoxia or hypercapnia: Secondary | ICD-10-CM

## 2021-02-02 DIAGNOSIS — Z20822 Contact with and (suspected) exposure to covid-19: Secondary | ICD-10-CM | POA: Diagnosis present

## 2021-02-02 DIAGNOSIS — Z7983 Long term (current) use of bisphosphonates: Secondary | ICD-10-CM

## 2021-02-02 DIAGNOSIS — F419 Anxiety disorder, unspecified: Secondary | ICD-10-CM | POA: Diagnosis present

## 2021-02-02 DIAGNOSIS — E785 Hyperlipidemia, unspecified: Secondary | ICD-10-CM | POA: Diagnosis present

## 2021-02-02 DIAGNOSIS — Z7902 Long term (current) use of antithrombotics/antiplatelets: Secondary | ICD-10-CM

## 2021-02-02 DIAGNOSIS — J9622 Acute and chronic respiratory failure with hypercapnia: Secondary | ICD-10-CM | POA: Diagnosis not present

## 2021-02-02 DIAGNOSIS — M810 Age-related osteoporosis without current pathological fracture: Secondary | ICD-10-CM | POA: Diagnosis present

## 2021-02-02 DIAGNOSIS — E1122 Type 2 diabetes mellitus with diabetic chronic kidney disease: Secondary | ICD-10-CM | POA: Diagnosis present

## 2021-02-02 DIAGNOSIS — Z515 Encounter for palliative care: Secondary | ICD-10-CM

## 2021-02-02 DIAGNOSIS — D631 Anemia in chronic kidney disease: Secondary | ICD-10-CM | POA: Diagnosis present

## 2021-02-02 DIAGNOSIS — J948 Other specified pleural conditions: Secondary | ICD-10-CM | POA: Diagnosis present

## 2021-02-02 DIAGNOSIS — J95811 Postprocedural pneumothorax: Secondary | ICD-10-CM | POA: Diagnosis not present

## 2021-02-02 DIAGNOSIS — E871 Hypo-osmolality and hyponatremia: Secondary | ICD-10-CM | POA: Diagnosis not present

## 2021-02-02 DIAGNOSIS — G9341 Metabolic encephalopathy: Secondary | ICD-10-CM | POA: Diagnosis not present

## 2021-02-02 DIAGNOSIS — K219 Gastro-esophageal reflux disease without esophagitis: Secondary | ICD-10-CM | POA: Diagnosis present

## 2021-02-02 DIAGNOSIS — I071 Rheumatic tricuspid insufficiency: Secondary | ICD-10-CM | POA: Diagnosis present

## 2021-02-02 DIAGNOSIS — J90 Pleural effusion, not elsewhere classified: Secondary | ICD-10-CM

## 2021-02-02 DIAGNOSIS — I509 Heart failure, unspecified: Secondary | ICD-10-CM

## 2021-02-02 DIAGNOSIS — I5033 Acute on chronic diastolic (congestive) heart failure: Secondary | ICD-10-CM | POA: Diagnosis present

## 2021-02-02 DIAGNOSIS — I5082 Biventricular heart failure: Secondary | ICD-10-CM | POA: Diagnosis present

## 2021-02-02 DIAGNOSIS — E662 Morbid (severe) obesity with alveolar hypoventilation: Secondary | ICD-10-CM | POA: Diagnosis present

## 2021-02-02 DIAGNOSIS — Z8616 Personal history of COVID-19: Secondary | ICD-10-CM

## 2021-02-02 DIAGNOSIS — Z8719 Personal history of other diseases of the digestive system: Secondary | ICD-10-CM

## 2021-02-02 DIAGNOSIS — N179 Acute kidney failure, unspecified: Secondary | ICD-10-CM | POA: Diagnosis present

## 2021-02-02 DIAGNOSIS — I2722 Pulmonary hypertension due to left heart disease: Secondary | ICD-10-CM | POA: Diagnosis present

## 2021-02-02 DIAGNOSIS — I2510 Atherosclerotic heart disease of native coronary artery without angina pectoris: Secondary | ICD-10-CM | POA: Diagnosis present

## 2021-02-02 DIAGNOSIS — Z8701 Personal history of pneumonia (recurrent): Secondary | ICD-10-CM

## 2021-02-02 DIAGNOSIS — E878 Other disorders of electrolyte and fluid balance, not elsewhere classified: Secondary | ICD-10-CM | POA: Diagnosis not present

## 2021-02-02 DIAGNOSIS — N1831 Chronic kidney disease, stage 3a: Secondary | ICD-10-CM | POA: Diagnosis present

## 2021-02-02 DIAGNOSIS — J918 Pleural effusion in other conditions classified elsewhere: Secondary | ICD-10-CM | POA: Diagnosis present

## 2021-02-02 DIAGNOSIS — I130 Hypertensive heart and chronic kidney disease with heart failure and stage 1 through stage 4 chronic kidney disease, or unspecified chronic kidney disease: Principal | ICD-10-CM | POA: Diagnosis present

## 2021-02-02 DIAGNOSIS — Z9889 Other specified postprocedural states: Secondary | ICD-10-CM

## 2021-02-02 DIAGNOSIS — K760 Fatty (change of) liver, not elsewhere classified: Secondary | ICD-10-CM | POA: Diagnosis present

## 2021-02-02 DIAGNOSIS — J441 Chronic obstructive pulmonary disease with (acute) exacerbation: Secondary | ICD-10-CM | POA: Diagnosis present

## 2021-02-02 DIAGNOSIS — M1990 Unspecified osteoarthritis, unspecified site: Secondary | ICD-10-CM | POA: Diagnosis present

## 2021-02-02 DIAGNOSIS — E0590 Thyrotoxicosis, unspecified without thyrotoxic crisis or storm: Secondary | ICD-10-CM | POA: Diagnosis present

## 2021-02-02 HISTORY — DX: Chronic respiratory failure, unspecified whether with hypoxia or hypercapnia: J96.10

## 2021-02-02 HISTORY — DX: Cerebral infarction, unspecified: I63.9

## 2021-02-02 HISTORY — DX: Other diseases of mediastinum, not elsewhere classified: J98.59

## 2021-02-02 HISTORY — DX: Pulmonary hypertension, unspecified: I27.20

## 2021-02-02 HISTORY — DX: Chronic kidney disease, stage 3a: N18.31

## 2021-02-02 HISTORY — DX: Ischemia and infarction of kidney: N28.0

## 2021-02-02 HISTORY — DX: Atherosclerotic heart disease of native coronary artery without angina pectoris: I25.10

## 2021-02-02 NOTE — ED Triage Notes (Signed)
Patient brought in by EMS from the [facility]. Patient is originally from home and wears 4L of oxygen at home. Patient was using 4L of O2 at center. Oxygen dropped to 30% for 10 minutes. Oxygen gradually increased and has remained at 99% on 4L.

## 2021-02-03 ENCOUNTER — Emergency Department (HOSPITAL_COMMUNITY): Payer: Medicare HMO

## 2021-02-03 ENCOUNTER — Encounter (HOSPITAL_COMMUNITY): Payer: Self-pay | Admitting: Family Medicine

## 2021-02-03 ENCOUNTER — Ambulatory Visit: Payer: Medicare HMO | Admitting: Pulmonary Disease

## 2021-02-03 DIAGNOSIS — J948 Other specified pleural conditions: Secondary | ICD-10-CM | POA: Diagnosis present

## 2021-02-03 DIAGNOSIS — I509 Heart failure, unspecified: Secondary | ICD-10-CM | POA: Diagnosis not present

## 2021-02-03 DIAGNOSIS — I272 Pulmonary hypertension, unspecified: Secondary | ICD-10-CM | POA: Diagnosis not present

## 2021-02-03 DIAGNOSIS — E0590 Thyrotoxicosis, unspecified without thyrotoxic crisis or storm: Secondary | ICD-10-CM | POA: Diagnosis not present

## 2021-02-03 DIAGNOSIS — Z7189 Other specified counseling: Secondary | ICD-10-CM | POA: Diagnosis not present

## 2021-02-03 DIAGNOSIS — D631 Anemia in chronic kidney disease: Secondary | ICD-10-CM | POA: Diagnosis present

## 2021-02-03 DIAGNOSIS — Z20822 Contact with and (suspected) exposure to covid-19: Secondary | ICD-10-CM | POA: Diagnosis present

## 2021-02-03 DIAGNOSIS — E039 Hypothyroidism, unspecified: Secondary | ICD-10-CM | POA: Diagnosis present

## 2021-02-03 DIAGNOSIS — E875 Hyperkalemia: Secondary | ICD-10-CM

## 2021-02-03 DIAGNOSIS — J9621 Acute and chronic respiratory failure with hypoxia: Secondary | ICD-10-CM | POA: Diagnosis present

## 2021-02-03 DIAGNOSIS — K760 Fatty (change of) liver, not elsewhere classified: Secondary | ICD-10-CM | POA: Diagnosis present

## 2021-02-03 DIAGNOSIS — R531 Weakness: Secondary | ICD-10-CM | POA: Diagnosis not present

## 2021-02-03 DIAGNOSIS — E871 Hypo-osmolality and hyponatremia: Secondary | ICD-10-CM | POA: Diagnosis not present

## 2021-02-03 DIAGNOSIS — J918 Pleural effusion in other conditions classified elsewhere: Secondary | ICD-10-CM | POA: Diagnosis present

## 2021-02-03 DIAGNOSIS — I071 Rheumatic tricuspid insufficiency: Secondary | ICD-10-CM | POA: Diagnosis present

## 2021-02-03 DIAGNOSIS — N1831 Chronic kidney disease, stage 3a: Secondary | ICD-10-CM | POA: Diagnosis not present

## 2021-02-03 DIAGNOSIS — I130 Hypertensive heart and chronic kidney disease with heart failure and stage 1 through stage 4 chronic kidney disease, or unspecified chronic kidney disease: Secondary | ICD-10-CM | POA: Diagnosis present

## 2021-02-03 DIAGNOSIS — N179 Acute kidney failure, unspecified: Secondary | ICD-10-CM | POA: Diagnosis not present

## 2021-02-03 DIAGNOSIS — J9622 Acute and chronic respiratory failure with hypercapnia: Secondary | ICD-10-CM | POA: Diagnosis present

## 2021-02-03 DIAGNOSIS — I21A1 Myocardial infarction type 2: Secondary | ICD-10-CM | POA: Diagnosis present

## 2021-02-03 DIAGNOSIS — I5033 Acute on chronic diastolic (congestive) heart failure: Secondary | ICD-10-CM | POA: Diagnosis present

## 2021-02-03 DIAGNOSIS — I700 Atherosclerosis of aorta: Secondary | ICD-10-CM | POA: Diagnosis present

## 2021-02-03 DIAGNOSIS — J441 Chronic obstructive pulmonary disease with (acute) exacerbation: Secondary | ICD-10-CM | POA: Diagnosis present

## 2021-02-03 DIAGNOSIS — E1122 Type 2 diabetes mellitus with diabetic chronic kidney disease: Secondary | ICD-10-CM | POA: Diagnosis present

## 2021-02-03 DIAGNOSIS — Z515 Encounter for palliative care: Secondary | ICD-10-CM | POA: Diagnosis not present

## 2021-02-03 DIAGNOSIS — J90 Pleural effusion, not elsewhere classified: Secondary | ICD-10-CM | POA: Diagnosis not present

## 2021-02-03 DIAGNOSIS — I214 Non-ST elevation (NSTEMI) myocardial infarction: Secondary | ICD-10-CM | POA: Diagnosis not present

## 2021-02-03 DIAGNOSIS — I2781 Cor pulmonale (chronic): Secondary | ICD-10-CM | POA: Diagnosis present

## 2021-02-03 DIAGNOSIS — I2722 Pulmonary hypertension due to left heart disease: Secondary | ICD-10-CM | POA: Diagnosis present

## 2021-02-03 DIAGNOSIS — G9341 Metabolic encephalopathy: Secondary | ICD-10-CM | POA: Diagnosis not present

## 2021-02-03 DIAGNOSIS — E662 Morbid (severe) obesity with alveolar hypoventilation: Secondary | ICD-10-CM | POA: Diagnosis present

## 2021-02-03 DIAGNOSIS — Z8616 Personal history of COVID-19: Secondary | ICD-10-CM | POA: Diagnosis not present

## 2021-02-03 LAB — CBC
HCT: 28.5 % — ABNORMAL LOW (ref 36.0–46.0)
HCT: 30.5 % — ABNORMAL LOW (ref 36.0–46.0)
Hemoglobin: 8.7 g/dL — ABNORMAL LOW (ref 12.0–15.0)
Hemoglobin: 9.4 g/dL — ABNORMAL LOW (ref 12.0–15.0)
MCH: 25.8 pg — ABNORMAL LOW (ref 26.0–34.0)
MCH: 25.9 pg — ABNORMAL LOW (ref 26.0–34.0)
MCHC: 30.5 g/dL (ref 30.0–36.0)
MCHC: 30.8 g/dL (ref 30.0–36.0)
MCV: 84 fL (ref 80.0–100.0)
MCV: 84.6 fL (ref 80.0–100.0)
Platelets: 237 10*3/uL (ref 150–400)
Platelets: 266 10*3/uL (ref 150–400)
RBC: 3.37 MIL/uL — ABNORMAL LOW (ref 3.87–5.11)
RBC: 3.63 MIL/uL — ABNORMAL LOW (ref 3.87–5.11)
RDW: 16 % — ABNORMAL HIGH (ref 11.5–15.5)
RDW: 16.1 % — ABNORMAL HIGH (ref 11.5–15.5)
WBC: 6.6 10*3/uL (ref 4.0–10.5)
WBC: 8.1 10*3/uL (ref 4.0–10.5)
nRBC: 0 % (ref 0.0–0.2)
nRBC: 0 % (ref 0.0–0.2)

## 2021-02-03 LAB — TROPONIN I (HIGH SENSITIVITY)
Troponin I (High Sensitivity): 72 ng/L — ABNORMAL HIGH (ref <18)
Troponin I (High Sensitivity): 59 ng/L — ABNORMAL HIGH (ref <18)

## 2021-02-03 LAB — BASIC METABOLIC PANEL
Anion gap: 6 (ref 5–15)
Anion gap: 7 (ref 5–15)
Anion gap: 8 (ref 5–15)
BUN: 44 mg/dL — ABNORMAL HIGH (ref 8–23)
BUN: 45 mg/dL — ABNORMAL HIGH (ref 8–23)
BUN: 47 mg/dL — ABNORMAL HIGH (ref 8–23)
CO2: 41 mmol/L — ABNORMAL HIGH (ref 22–32)
CO2: 42 mmol/L — ABNORMAL HIGH (ref 22–32)
CO2: 44 mmol/L — ABNORMAL HIGH (ref 22–32)
Calcium: 8.8 mg/dL — ABNORMAL LOW (ref 8.9–10.3)
Calcium: 8.9 mg/dL (ref 8.9–10.3)
Calcium: 9.1 mg/dL (ref 8.9–10.3)
Chloride: 86 mmol/L — ABNORMAL LOW (ref 98–111)
Chloride: 86 mmol/L — ABNORMAL LOW (ref 98–111)
Chloride: 88 mmol/L — ABNORMAL LOW (ref 98–111)
Creatinine, Ser: 1.44 mg/dL — ABNORMAL HIGH (ref 0.44–1.00)
Creatinine, Ser: 1.56 mg/dL — ABNORMAL HIGH (ref 0.44–1.00)
Creatinine, Ser: 1.62 mg/dL — ABNORMAL HIGH (ref 0.44–1.00)
GFR, Estimated: 32 mL/min — ABNORMAL LOW (ref >60)
GFR, Estimated: 34 mL/min — ABNORMAL LOW (ref >60)
GFR, Estimated: 37 mL/min — ABNORMAL LOW (ref >60)
Glucose, Bld: 133 mg/dL — ABNORMAL HIGH (ref 70–99)
Glucose, Bld: 139 mg/dL — ABNORMAL HIGH (ref 70–99)
Glucose, Bld: 144 mg/dL — ABNORMAL HIGH (ref 70–99)
Potassium: 4 mmol/L (ref 3.5–5.1)
Potassium: 4.1 mmol/L (ref 3.5–5.1)
Potassium: 6 mmol/L — ABNORMAL HIGH (ref 3.5–5.1)
Sodium: 134 mmol/L — ABNORMAL LOW (ref 135–145)
Sodium: 136 mmol/L (ref 135–145)
Sodium: 138 mmol/L (ref 135–145)

## 2021-02-03 LAB — URINALYSIS, ROUTINE W REFLEX MICROSCOPIC
Bilirubin Urine: NEGATIVE
Glucose, UA: NEGATIVE mg/dL
Hgb urine dipstick: NEGATIVE
Ketones, ur: NEGATIVE mg/dL
Leukocytes,Ua: NEGATIVE
Nitrite: NEGATIVE
Protein, ur: NEGATIVE mg/dL
Specific Gravity, Urine: 1.009 (ref 1.005–1.030)
pH: 7 (ref 5.0–8.0)

## 2021-02-03 LAB — RESP PANEL BY RT-PCR (FLU A&B, COVID) ARPGX2
Influenza A by PCR: NEGATIVE
Influenza B by PCR: NEGATIVE
SARS Coronavirus 2 by RT PCR: NEGATIVE

## 2021-02-03 LAB — MAGNESIUM: Magnesium: 2.7 mg/dL — ABNORMAL HIGH (ref 1.7–2.4)

## 2021-02-03 LAB — BRAIN NATRIURETIC PEPTIDE: B Natriuretic Peptide: 670.8 pg/mL — ABNORMAL HIGH (ref 0.0–100.0)

## 2021-02-03 MED ORDER — ACETAMINOPHEN 650 MG RE SUPP: 650.0000 mg | Freq: Four times a day (QID) | RECTAL | Status: DC | PRN

## 2021-02-03 MED ORDER — ALBUTEROL SULFATE (2.5 MG/3ML) 0.083% IN NEBU: 2.5000 mg | INHALATION_SOLUTION | Freq: Four times a day (QID) | RESPIRATORY_TRACT | Status: DC | PRN

## 2021-02-03 MED ORDER — SODIUM CHLORIDE 0.9% FLUSH
3.0000 mL | Freq: Two times a day (BID) | INTRAVENOUS | Status: DC
  Administered 2021-02-03 – 2021-02-22 (×32): 3 mL via INTRAVENOUS

## 2021-02-03 MED ORDER — HEPARIN SODIUM (PORCINE) 5000 UNIT/ML IJ SOLN
5000.0000 [IU] | Freq: Three times a day (TID) | INTRAMUSCULAR | Status: DC
  Administered 2021-02-03 – 2021-02-13 (×30): 5000 [IU] via SUBCUTANEOUS
  Filled 2021-02-03 (×30): qty 1

## 2021-02-03 MED ORDER — DIAZEPAM 2 MG PO TABS: 2.0000 mg | ORAL_TABLET | Freq: Two times a day (BID) | ORAL | Status: DC | PRN

## 2021-02-03 MED ORDER — FUROSEMIDE 10 MG/ML IJ SOLN
40.0000 mg | Freq: Two times a day (BID) | INTRAMUSCULAR | Status: DC
  Administered 2021-02-03 – 2021-02-04 (×2): 40 mg via INTRAVENOUS
  Filled 2021-02-03 (×2): qty 4

## 2021-02-03 MED ORDER — MELATONIN 5 MG PO TABS
10.0000 mg | ORAL_TABLET | Freq: Every day | ORAL | Status: DC
  Administered 2021-02-04 – 2021-02-12 (×9): 10 mg via ORAL
  Administered 2021-02-13: 5 mg via ORAL
  Administered 2021-02-14 – 2021-02-21 (×8): 10 mg via ORAL
  Filled 2021-02-03 (×18): qty 2

## 2021-02-03 MED ORDER — SIMVASTATIN 40 MG PO TABS
40.0000 mg | ORAL_TABLET | Freq: Every day | ORAL | Status: DC
  Administered 2021-02-03 – 2021-02-04 (×2): 40 mg via ORAL
  Filled 2021-02-03 (×2): qty 1

## 2021-02-03 MED ORDER — FUROSEMIDE 10 MG/ML IJ SOLN
40.0000 mg | Freq: Two times a day (BID) | INTRAMUSCULAR | Status: DC
  Administered 2021-02-03: 40 mg via INTRAVENOUS
  Filled 2021-02-03: qty 4

## 2021-02-03 MED ORDER — METHIMAZOLE 5 MG PO TABS
2.5000 mg | ORAL_TABLET | Freq: Every day | ORAL | Status: DC
  Administered 2021-02-03 – 2021-02-22 (×20): 2.5 mg via ORAL
  Filled 2021-02-03 (×20): qty 1

## 2021-02-03 MED ORDER — FUROSEMIDE 10 MG/ML IJ SOLN
40.0000 mg | Freq: Once | INTRAMUSCULAR | Status: AC
  Administered 2021-02-03: 40 mg via INTRAVENOUS
  Filled 2021-02-03: qty 4

## 2021-02-03 MED ORDER — SODIUM ZIRCONIUM CYCLOSILICATE 10 G PO PACK
10.0000 g | PACK | Freq: Once | ORAL | Status: AC
  Administered 2021-02-03: 10 g via ORAL
  Filled 2021-02-03: qty 1

## 2021-02-03 MED ORDER — CLOPIDOGREL BISULFATE 75 MG PO TABS
75.0000 mg | ORAL_TABLET | Freq: Every day | ORAL | Status: DC
Start: 2021-02-03 — End: 2021-02-23
  Administered 2021-02-03 – 2021-02-22 (×20): 75 mg via ORAL
  Filled 2021-02-03 (×20): qty 1

## 2021-02-03 MED ORDER — PANTOPRAZOLE SODIUM 40 MG PO TBEC
40.0000 mg | DELAYED_RELEASE_TABLET | Freq: Every morning | ORAL | Status: DC
  Administered 2021-02-03 – 2021-02-22 (×20): 40 mg via ORAL
  Filled 2021-02-03 (×18): qty 1

## 2021-02-03 MED ORDER — ATENOLOL 25 MG PO TABS
50.0000 mg | ORAL_TABLET | Freq: Two times a day (BID) | ORAL | Status: DC
  Administered 2021-02-03 – 2021-02-09 (×12): 50 mg via ORAL
  Filled 2021-02-03 (×6): qty 2
  Filled 2021-02-03 (×2): qty 1
  Filled 2021-02-03 (×2): qty 2
  Filled 2021-02-03: qty 1
  Filled 2021-02-03: qty 2
  Filled 2021-02-03: qty 1

## 2021-02-03 MED ORDER — HYDRALAZINE HCL 50 MG PO TABS
50.0000 mg | ORAL_TABLET | Freq: Three times a day (TID) | ORAL | Status: DC
  Administered 2021-02-03 – 2021-02-04 (×6): 50 mg via ORAL
  Filled 2021-02-03 (×7): qty 1

## 2021-02-03 MED ORDER — ACETAMINOPHEN 325 MG PO TABS
650.0000 mg | ORAL_TABLET | Freq: Four times a day (QID) | ORAL | Status: DC | PRN
  Administered 2021-02-06 – 2021-02-18 (×6): 650 mg via ORAL
  Filled 2021-02-03 (×8): qty 2

## 2021-02-03 NOTE — ED Notes (Signed)
Patient was given her dinner tray. I offered to feed her explaining the different items she had on her tray and she told me no to each item. She declined tea as well. RN notified.

## 2021-02-03 NOTE — ED Notes (Signed)
Pt found in a sitting position beside the bed.  Pt reports rolling out of bed.  Staff present assisted pt back to bed.  Ensured working call bell.  No obvious signs of injury.  O2 was noted to be in the 60's as supplemental O2 had come off. Pt quickly went to 96% on non rebreather was switched after approximately 3 minutes back to 4L O2 via [place].  Daughter [name] notified as well as attending MD.

## 2021-02-03 NOTE — H&P (Signed)
History and Physical    Mandy [name] DJS:[id number] DOB: Apr 26, 1940 DOA: 02/02/2021  PCP: [name], MD   Patient coming from: Home   Chief Complaint: Hypoxia, SOB   HPI: Mandy [name] is a pleasant 81 y.o. female with medical history significant for HFpEF, severe pulmonary hypertension, chronic hypoxic and hypercarbic respiratory failure, CKD 3A, hypothyroidism, anxiety, CAD, GERD, and cryptogenic CVA, now presenting from a sleep study center where she reportedly had drop in oxygen saturation to the 30s for 10 minutes.  Patient has reportedly been increasingly dyspneic for at least 2 to 3 weeks now, had increased her home oxygen from 2 to 4 L/min, and had her torsemide increased approximately a week ago without improvement.  She has not been coughing, denies subjective fever or chills, and denies missing doses of her medication.  She has not noticed any leg swelling and denies leg tenderness, pleuritic pain, or hemoptysis.  ED Course: Upon arrival to the ED, patient is found to be afebrile, saturating mid 90s on 4 L/min of supplemental oxygen, and with blood pressure 175/90.  EKG features sinus rhythm.  Chest x-ray notable for cardiomegaly with moderate pulmonary edema, moderate right pleural effusion which has increased, and stable small left pleural effusion.  Chemistry panel features a potassium of 6.0, bicarbonate 41, and creatinine 1.62.  CBC was stable normocytic anemia.  BNP elevated 671 and troponin elevated to 72 and then 59.  Patient was treated with 40 mg IV Lasix and 10 g of Lokelma in the ED.  Review of Systems:  All other systems reviewed and apart from HPI, are negative.  Past Medical History:  Diagnosis Date   Aortic atherosclerosis ([place]) 10/08/2018   Arthritis    Diabetes mellitus without complication ([place])    GERD (gastroesophageal reflux disease)    Hepatic steatosis 10/08/2018   Hyperlipidemia    Hypertension    Hyperthyroidism    Mass of right ovary 10/08/2018    Osteoporosis    Pneumonia    SBO (small bowel obstruction) ([place]) 08/09/2018   SBO (small bowel obstruction) ([place]) 08/09/2018    Past Surgical History:  Procedure Laterality Date   CATARACT EXTRACTION W/ INTRAOCULAR LENS  IMPLANT, BILATERAL     COLONOSCOPY     LEFT HEART CATH AND CORONARY ANGIOGRAPHY N/A 11/30/2020   Procedure: LEFT HEART CATH AND CORONARY ANGIOGRAPHY;  Surgeon: Early Osmond, MD;  Location: [facility];  Service: Cardiovascular;  Laterality: N/A;   RIGHT/LEFT HEART CATH AND CORONARY ANGIOGRAPHY N/A 11/30/2020   Procedure: RIGHT/LEFT HEART CATH AND CORONARY ANGIOGRAPHY;  Surgeon: Early Osmond, MD;  Location: [facility];  Service: Cardiovascular;  Laterality: N/A;    Social History:   reports that she has never smoked. She has never used smokeless tobacco. She reports that she does not drink alcohol and does not use drugs.  No Known Allergies  Family History  Problem Relation Age of Onset   Kidney disease Son    Colon cancer Neg Hx    Breast cancer Neg Hx      Prior to Admission medications   Medication Sig Start Date End Date Taking? Authorizing Provider  acetaminophen (TYLENOL) 500 MG tablet Take 1,000 mg by mouth every 6 (six) hours as needed for fever or headache (pain).    [provider]  albuterol (PROVENTIL) (2.5 MG/3ML) 0.083% nebulizer solution Take 2.5 mg by nebulization every 6 (six) hours as needed for wheezing or shortness of breath.    [provider]  albuterol (VENTOLIN HFA) 108 (90 Base) MCG/ACT inhaler Inhale 2 puffs into the lungs every 6 (six) hours as needed for wheezing or shortness of breath. 06/30/20   Thurnell Lose, MD  alendronate (FOSAMAX) 70 MG tablet Take 70 mg by mouth every Tuesday. Take with a full glass of water on an empty stomach.    [provider]  atenolol (TENORMIN) 50 MG tablet Take 50 mg by mouth 2 (two) times daily.    [provider]  calcium carbonate (OSCAL)  1500 (600 Ca) MG TABS tablet Take 600 mg of elemental calcium by mouth 2 (two) times daily with a meal.    [provider]  cetirizine (ZYRTEC) 10 MG tablet Take 10 mg by mouth daily. 12/31/20   [provider]  clopidogrel (PLAVIX) 75 MG tablet Take 1 tablet (75 mg total) by mouth daily. 12/08/20   Bonnielee Haff, MD  diazepam (VALIUM) 2 MG tablet Take 2 mg by mouth every 12 (twelve) hours as needed for anxiety.    [provider]  diclofenac sodium (VOLTAREN) 1 % GEL Apply 4 g topically 4 (four) times daily as needed for pain. shoulder 09/13/18   [provider]  gabapentin (NEURONTIN) 100 MG capsule Take 100 mg by mouth at bedtime. 07/18/18   [provider]  hydrALAZINE (APRESOLINE) 100 MG tablet Take 0.5 tablets (50 mg total) by mouth 3 (three) times daily. 10/09/20   Nicole Kindred A, DO  melatonin 5 MG TABS Take 10 mg by mouth at bedtime.    [provider]  methimazole (TAPAZOLE) 5 MG tablet Take 0.5 tablets (2.5 mg total) by mouth daily. 02/04/19   Hosie Poisson, MD  Multiple Vitamin (MULTIVITAMIN WITH MINERALS) TABS tablet Take 1 tablet by mouth every morning.    [provider]  Omega-3 Fatty Acids (FISH OIL PO) Take 1 capsule by mouth every morning.    [provider]  OXYGEN Inhale 2-4 L into the lungs continuous.    [provider]  pantoprazole (PROTONIX) 40 MG tablet Take 1 tablet (40 mg total) by mouth daily at 6 (six) AM. Patient taking differently: Take 40 mg by mouth every morning. 06/30/20   Thurnell Lose, MD  potassium chloride SA (KLOR-CON M) 20 MEQ tablet Take 1 tablet (20 mEq total) by mouth daily. 12/08/20   Bonnielee Haff, MD  Pseudoeph-CPM-DM-APAP [facility] FLU/COLD/COUGH PO) Take 30 mLs by mouth 2 (two) times daily as needed (cough/congestion).    [provider]  simvastatin (ZOCOR) 40 MG tablet Take 1 tablet (40 mg total) by mouth at bedtime. 12/08/20   Bonnielee Haff, MD   torsemide 40 MG TABS Take 40 mg by mouth in the morning and at bedtime. 01/25/21 02/24/21  Baldwin Jamaica, PA-C  vitamin B-12 (CYANOCOBALAMIN) 1000 MCG tablet Take 1,000 mcg by mouth daily.    [provider]  vitamin C (ASCORBIC ACID) 500 MG tablet Take 500 mg by mouth daily.    [provider]  Vitamin D, Cholecalciferol, 25 MCG (1000 UT) TABS Take 1,000 Units by mouth daily.    [provider]  zinc gluconate 50 MG tablet Take 50 mg by mouth daily.    [provider]    Physical Exam: Vitals:   02/02/21 2355 02/02/21 2356 02/03/21 0000 02/03/21 0302  BP:   (!) 175/90   Pulse:   72   Resp:   20   Temp:    97.6 F (36.4 C)  TempSrc:  Oral  SpO2: 99% 97% 96%     Constitutional: NAD, calm  Eyes: PERTLA, lids and conjunctivae normal ENMT: Mucous membranes are moist. Posterior pharynx clear of any exudate or lesions.   Neck: supple, no masses  Respiratory: no wheezing. Fine rales b/l. Dyspnea with speech.  Cardiovascular: S1 & S2 heard, regular rate and rhythm. No extremity edema.  Abdomen: No distension, no tenderness, soft. Bowel sounds active.  Musculoskeletal: no clubbing / cyanosis. No joint deformity upper and lower extremities.   Skin: no significant rashes, lesions, ulcers. Warm, dry, well-perfused. Neurologic: CN 2-12 grossly intact. Moving all extremities. Alert and oriented.  Psychiatric: Very pleasant. Cooperative.    Labs and Imaging on Admission: I have personally reviewed following labs and imaging studies  CBC: Recent Labs  Lab 02/03/21 0007  WBC 8.1  HGB 9.4*  HCT 30.5*  MCV 84.0  PLT 759   Basic Metabolic Panel: Recent Labs  Lab 02/03/21 0007  NA 134*  K 6.0*  CL 86*  CO2 41*  GLUCOSE 144*  BUN 47*  CREATININE 1.62*  CALCIUM 8.9   GFR: Estimated Creatinine Clearance: 25 mL/min (A) (by C-G formula based on SCr of 1.62 mg/dL (H)). Liver Function Tests: No results for input(s): AST, ALT, ALKPHOS, BILITOT,  PROT, ALBUMIN in the last 168 hours. No results for input(s): LIPASE, AMYLASE in the last 168 hours. No results for input(s): AMMONIA in the last 168 hours. Coagulation Profile: No results for input(s): INR, PROTIME in the last 168 hours. Cardiac Enzymes: No results for input(s): CKTOTAL, CKMB, CKMBINDEX, TROPONINI in the last 168 hours. BNP (last 3 results) Recent Labs    01/14/21 1653  PROBNP 443.0*   HbA1C: No results for input(s): HGBA1C in the last 72 hours. CBG: No results for input(s): GLUCAP in the last 168 hours. Lipid Profile: No results for input(s): CHOL, HDL, LDLCALC, TRIG, CHOLHDL, LDLDIRECT in the last 72 hours. Thyroid Function Tests: No results for input(s): TSH, T4TOTAL, FREET4, T3FREE, THYROIDAB in the last 72 hours. Anemia Panel: No results for input(s): VITAMINB12, FOLATE, FERRITIN, TIBC, IRON, RETICCTPCT in the last 72 hours. Urine analysis:    Component Value Date/Time   COLORURINE YELLOW 02/03/2021 [place] 02/03/2021 0318   LABSPEC 1.009 02/03/2021 0318   PHURINE 7.0 02/03/2021 0318   GLUCOSEU NEGATIVE 02/03/2021 0318   HGBUR NEGATIVE 02/03/2021 0318   BILIRUBINUR NEGATIVE 02/03/2021 0318   KETONESUR NEGATIVE 02/03/2021 0318   PROTEINUR NEGATIVE 02/03/2021 0318   NITRITE NEGATIVE 02/03/2021 0318   LEUKOCYTESUR NEGATIVE 02/03/2021 0318   Sepsis Labs: @LABRCNTIP (procalcitonin:4,lacticidven:4) )No results found for this or any previous visit (from the past 240 hour(s)).   Radiological Exams on Admission: DG Chest Port 1 View  Result Date: 02/03/2021 CLINICAL DATA:  Shortness of breath. EXAM: PORTABLE CHEST 1 VIEW COMPARISON:  Chest x-ray 12/15/2020. FINDINGS: The heart is enlarged, unchanged. There is central pulmonary vascular congestion with perihilar opacities. Moderate right pleural effusion has increased. Small left pleural effusion is stable. There is no pneumothorax or acute fracture. IMPRESSION: 1. Cardiomegaly with moderate  pulmonary edema. 2. Moderate right pleural effusion has increased. Small left pleural effusion is stable. Electronically Signed   By: Ronney Asters M.D.   On: 02/03/2021 01:06    EKG: Independently reviewed. Sinus rhythm.   Assessment/Plan   1. Acute on chronic diastolic CHF; pulmonary HTN; acute on chronic hypoxic and hypercarbic respiratory failure  - Pt with HFpEF and severe pulmonary HTN sent from sleep study center after  O2 sat dropped to 30s for 10 min   - She has been using 2-4 Lpm O2 at home but increasingly dyspneic despite recent increase in diuretics  - Pulmonary edema and increased right pleural effusion noted on CXR  - She was given Lasix 40 mg IV in ED  - Continue diuresis with 40 mg IV Lasix q12h, monitor weight and I/Os, monitor renal function and electrolytes    2. AKI superimposed on CKD IIIa; hyperkalemia  - SCr is 1.62 on admission (1.21 on 01/25/21) and potassium is 6.0  - She is hypervolemic on admission and treated with IV Lasix and Lokelma in ED  - Continue IV Lasix, continue cardiac monitoring, serial chem panels    3. CAD  - Mild obstructive CAD on cath in Nov 2022  - Troponin 72 then 59 in ED without anginal complaints, likely d/t severe hypoxia  - Continue Zocor and Plavix    4. Hx of CVA  - hx of cryptogenic CVA, no a fib on recent ambulatory monitoring  - Continue Zocor and Plavix    5. Hypertension  - BP elevated in ED, anticipate improvement with diuresis, continue atenolol and hydralazine   DVT prophylaxis: sq heparin  Code Status: Full  Level of Care: Level of care: Telemetry Family Communication: Daughter updated at bedside  Disposition Plan:  Patient is from: home  Anticipated d/c is to: TBD Anticipated d/c date is: 2/3 or 02/06/21  Patient currently: Pending improvement in respiratory status, resolution of hyperkalemia, stable renal function  Consults called: none  Admission status: Inpatient     Vianne Bulls, MD [facility]  02/03/2021, 4:52 AM

## 2021-02-03 NOTE — ED Provider Notes (Signed)
[facility] Emergency Department Provider Note MRN:  [id number]  Arrival date & time: 02/03/21     Chief Complaint   Shortness of breath History of Present Illness   Ellsie [name] is a 81 y.o. year-old female with a history of hypertension, diabetes, CHF, asthma presenting to the ED with chief complaint of shortness of breath.  Patient presenting from sleep study center, where reportedly her oxygen dropped to the 30s for 10 minutes.  Patient has been having some shortness of breath and right sided chest/flank pain for the past few days.  Some intermittent confusion.  Review of Systems  A thorough review of systems was obtained and all systems are negative except as noted in the HPI and PMH.   Patient's Health History    Past Medical History:  Diagnosis Date   Aortic atherosclerosis ([place]) 10/08/2018   Arthritis    Diabetes mellitus without complication ([facility])    GERD (gastroesophageal reflux disease)    Hepatic steatosis 10/08/2018   Hyperlipidemia    Hypertension    Hyperthyroidism    Mass of right ovary 10/08/2018   Osteoporosis    Pneumonia    SBO (small bowel obstruction) ([place]) 08/09/2018   SBO (small bowel obstruction) ([place]) 08/09/2018    Past Surgical History:  Procedure Laterality Date   CATARACT EXTRACTION W/ INTRAOCULAR LENS  IMPLANT, BILATERAL     COLONOSCOPY     LEFT HEART CATH AND CORONARY ANGIOGRAPHY N/A 11/30/2020   Procedure: LEFT HEART CATH AND CORONARY ANGIOGRAPHY;  Surgeon: Early Osmond, MD;  Location: [facility];  Service: Cardiovascular;  Laterality: N/A;   RIGHT/LEFT HEART CATH AND CORONARY ANGIOGRAPHY N/A 11/30/2020   Procedure: RIGHT/LEFT HEART CATH AND CORONARY ANGIOGRAPHY;  Surgeon: Early Osmond, MD;  Location: [facility];  Service: Cardiovascular;  Laterality: N/A;    Family History  Problem Relation Age of Onset   Kidney disease Son    Colon cancer Neg Hx    Breast cancer Neg Hx     Social  History   Socioeconomic History   Marital status: Widowed    Spouse name: Not on file   Number of children: Not on file   Years of education: Not on file   Highest education level: Not on file  Occupational History   Not on file  Tobacco Use   Smoking status: Never   Smokeless tobacco: Never  Vaping Use   Vaping Use: Never used  Substance and Sexual Activity   Alcohol use: No   Drug use: No   Sexual activity: Not on file  Other Topics Concern   Not on file  Social History Narrative   Patient is from [place]   Speaks [place]   Social Determinants of Health   Financial Resource Strain: Not on file  Food Insecurity: Not on file  Transportation Needs: Not on file  Physical Activity: Not on file  Stress: Not on file  Social Connections: Not on file  Intimate Partner Violence: Not on file     Physical Exam   Vitals:   02/03/21 0302 02/03/21 0600  BP:  (!) 166/89  Pulse:  66  Resp:  17  Temp: 97.6 F (36.4 C)   SpO2:  100%    CONSTITUTIONAL: Well-appearing, NAD NEURO/PSYCH:  Alert and oriented x 3, no focal deficits EYES:  eyes equal and reactive ENT/NECK:  no LAD, no JVD CARDIO: Regular rate, well-perfused, normal S1 and S2 PULM:  CTAB no wheezing or  rhonchi GI/GU:  non-distended, non-tender MSK/SPINE:  No gross deformities, no edema SKIN:  no rash, atraumatic   *Additional and/or pertinent findings included in MDM below  Diagnostic and Interventional Summary    EKG Interpretation  Date/Time:  Wednesday February 03 2021 02:07:12 EST Ventricular Rate:  70 PR Interval:  120 QRS Duration: 77 QT Interval:  397 QTC Calculation: 429 R Axis:   83 Text Interpretation: Sinus rhythm Probable left atrial enlargement Borderline right axis deviation Minimal ST depression, inferior leads Confirmed by Gerlene Fee [phone]) on 02/03/2021 2:45:02 AM       Labs Reviewed  CBC - Abnormal; Notable for the following components:      Result Value   RBC 3.63 (*)     Hemoglobin 9.4 (*)    HCT 30.5 (*)    MCH 25.9 (*)    RDW 16.0 (*)    All other components within normal limits  BASIC METABOLIC PANEL - Abnormal; Notable for the following components:   Sodium 134 (*)    Potassium 6.0 (*)    Chloride 86 (*)    CO2 41 (*)    Glucose, Bld 144 (*)    BUN 47 (*)    Creatinine, Ser 1.62 (*)    GFR, Estimated 32 (*)    All other components within normal limits  BRAIN NATRIURETIC PEPTIDE - Abnormal; Notable for the following components:   B Natriuretic Peptide 670.8 (*)    All other components within normal limits  BASIC METABOLIC PANEL - Abnormal; Notable for the following components:   Chloride 86 (*)    CO2 44 (*)    Glucose, Bld 133 (*)    BUN 45 (*)    Creatinine, Ser 1.56 (*)    GFR, Estimated 34 (*)    All other components within normal limits  MAGNESIUM - Abnormal; Notable for the following components:   Magnesium 2.7 (*)    All other components within normal limits  CBC - Abnormal; Notable for the following components:   RBC 3.37 (*)    Hemoglobin 8.7 (*)    HCT 28.5 (*)    MCH 25.8 (*)    RDW 16.1 (*)    All other components within normal limits  TROPONIN I (HIGH SENSITIVITY) - Abnormal; Notable for the following components:   Troponin I (High Sensitivity) 72 (*)    All other components within normal limits  TROPONIN I (HIGH SENSITIVITY) - Abnormal; Notable for the following components:   Troponin I (High Sensitivity) 59 (*)    All other components within normal limits  RESP PANEL BY RT-PCR (FLU A&B, COVID) [id number]  URINALYSIS, ROUTINE W REFLEX MICROSCOPIC  BASIC METABOLIC PANEL    DG Chest Port 1 View  Final Result      Medications  atenolol (TENORMIN) tablet 50 mg (has no administration in time range)  hydrALAZINE (APRESOLINE) tablet 50 mg (has no administration in time range)  simvastatin (ZOCOR) tablet 40 mg (has no administration in time range)  methimazole (TAPAZOLE) tablet 2.5 mg (has no administration in time range)   pantoprazole (PROTONIX) EC tablet 40 mg (has no administration in time range)  clopidogrel (PLAVIX) tablet 75 mg (has no administration in time range)  melatonin tablet 10 mg (has no administration in time range)  albuterol (VENTOLIN HFA) 108 (90 Base) MCG/ACT inhaler 2 puff (has no administration in time range)  diazepam (VALIUM) tablet 2 mg (has no administration in time range)  heparin injection 5,000 Units (5,000 Units Subcutaneous Given  02/03/21 0611)  sodium chloride flush (NS) 0.9 % injection 3 mL (has no administration in time range)  acetaminophen (TYLENOL) tablet 650 mg (has no administration in time range)    Or  acetaminophen (TYLENOL) suppository 650 mg (has no administration in time range)  furosemide (LASIX) injection 40 mg (has no administration in time range)  furosemide (LASIX) injection 40 mg (40 mg Intravenous Given 02/03/21 0254)  sodium zirconium cyclosilicate (LOKELMA) packet 10 g (10 g Oral Given 02/03/21 0253)     Procedures  /  Critical Care Ultrasound ED Peripheral IV (Provider)  Date/Time: 02/03/2021 7:21 AM Performed by: Maudie Flakes, MD Authorized by: Maudie Flakes, MD   Procedure details:    Indications: multiple failed IV attempts     Skin Prep: chlorhexidine gluconate     Location:  Left AC   Angiocath:  20 G   Bedside Ultrasound Guided: Yes     Patient tolerated procedure without complications: Yes     Dressing applied: Yes    ED Course and Medical Decision Making  Initial Impression and Ddx Differential diagnosis includes sleep apnea, CHF exacerbation, less likely ACS or PE.  Patient is on her baseline 3 to 4 L nasal cannula at this time, she is alert.  There is a language barrier by daughter at bedside interpreting.  Awaiting EKG, x-ray, labs.  Past medical/surgical history that increases complexity of ED encounter: CHF  Interpretation of Diagnostics I personally reviewed the EKG and Chest Xray and my interpretation is as follows: Sinus rhythm  with nonspecific ST findings, chest x-ray with evidence of pulmonary edema    Labs reveal elevated BNP, mildly elevated troponin.  Patient Reassessment and Ultimate Disposition/Management To be admitted to medicine for diuresis and further care.  Patient management required discussion with the following services or consulting groups:  Hospitalist Service  Complexity of Problems Addressed Chronic illness with exacerbation  Additional Data Reviewed and Analyzed Further history obtained from: EMS on arrival and Further history from spouse/family member  Factors Impacting ED Encounter Risk Consideration of hospitalization  Barth Kirks. [place] Small, [place] [email or web address]  Final Clinical Impressions(s) / ED Diagnoses     ICD-10-CM   1. Acute on chronic congestive heart failure, unspecified heart failure type ([place])  I50.9       ED Discharge Orders     None        Discharge Instructions Discussed with and Provided to Patient:   Discharge Instructions   None      Maudie Flakes, MD 02/03/21 [phone]

## 2021-02-03 NOTE — Progress Notes (Signed)
VAST consult for PIV start. Upon assessing the patient via visualization, palpation, and ultrasound on both upper extremities for a suitable vein for PIV access, no veins were visible. Bedside RN made aware. She stated she will contact the provider.   Amount of time assessing the patient approximately 20min.

## 2021-02-03 NOTE — Care Plan (Signed)
This 81 years old female with PMH significant for HFp EF, severe pulmonary hypertension, chronic hypoxic and hypercapnic respiratory failure, CKD stage IIIa, hypothyroidism, anxiety, CAD, GERD, cryptogenic CVA presented in the ED from sleep center where she reportedly had a drop in oxygen saturation to 30s for 10 minutes.  Patient reports increasing shortness of breath for last 2 to 3 weeks, reports increase in her oxygen requirement from 2 to 4 L/min, She reported increasing torsemide without any improvement.  Chest x-ray showed showed cardiomegaly with moderate pulmonary edema, moderate right pleural effusion which has increased and stable left small pleural effusion.  She was found to have hyperkalemia which has improved with IV diuresis. Patient was seen and examined at bedside.  Patient is admitted for acute on chronic diastolic CHF acute on chronic hypoxic and hypercapnic respiratory failure.  Cardiology is consulted,  awaiting recommendation.

## 2021-02-03 NOTE — Consult Note (Addendum)
Cardiology Consultation:   Patient ID: Mandy [name] MRN: [id number]; DOB: [birth year]  Admit date: 02/02/2021 Date of Consult: 02/03/2021  PCP:  Mandy Ebbs, MD   [facility] Providers Cardiologist:  Mandy Heinz, MD        Patient Profile:   Mandy [name] is a 81 y.o. 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  History of Present Illness:   Mandy [name] has had several admissions in the last 3 years, 7 of those in 2022.  When remotely admitted with SBO in 2020, imaging also incidentally showed possible prior renal infarction. 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 Notes indicate vascular team felt no further intervention needed at that time. She was admitted 02/2019 with Covid-19, encephalopathy and respiratory failure requiring [place] O2 able to be weaned at DC. She was again readmitted 06/2020 with acute hypoxic respiratory failure due to Covid-19 viral pneumonitis requiring 5L O2. She had incidental thymic mass detected at that time and underwent resection 09/2020 felt c/w benign thyroid tissue with nodular hyperplasia. Between 09/2020 and 12/2020 she had multiple admissions for acute on chronic respiratory failure requiring initiation of home O2. She has required BiPAP at times and intermittent thoracentesis procedures without malignancy on path thus far. Our team met her in 11/2020 for severe pulmonary HTN. Echo had shown 55 to 60%, mild RV dysfunction, severe pulmonary  hypertension (RVSP 83 mmHg), biatrial enlargement, mild to moderate MR, IVC fixed/dilated. She was diuresed but developed worsening renal function so this was switched to oral form. [facility] 11/30/20 showed nonobstructive CAD (30% mLAD, 30% dLAD), LVEDP 63mHg, moderate pulm HTN, preserved cardiac output, felt compensated. Pulmonology followed and felt component of OHS/OSA. During one of her December 2022 admissions she had a subacute stroke while admitted and was seen by neurology who recommended aspirin and Plavix for 3 weeks followed by Plavix alone. Carotid duplex 1-39% BICA. Repeat echo 12/15/20 showed EF 60-65%, grade 2 DD, interventricular septum is flattened in systole and diastole, consistent with right ventricular pressure and volume overload, mildly reduced RVF, severely elevated pulmonary artery systolic pressure, moderate LAE, moderate MR, moderate concentric LVH, dilated IVC, no significant change from prior. She saw EP who recommended event monitor which showed NSR, no AV block or pauses, no atrial fibrillation. Loop was discussed but the patient/family declined with plan to discuss further with Dr. SGardiner Rhymein the future. At last pulm OV 01/14/21, sleep study and PFTs were ordered (pending) with plan to see Dr. HSilas Floodto consider pulmonary HTN med start. Her torsemide was increased due to dyspnea. At f/u with EP APP 01/25/21 this was further increased to 444mBID for the same.  She [place] ED [place] Mandy [name] the phone per pt request. Her mom has been more weak the last few days. Her dyspnea has failed to improve. They've noticed some arm tremors. [place], [place] O2 dropping down into the 80s at times. There was report of  it being in the 30s at once as well. Her mom complained of feeling dizzy/unwell so was transported to the ER. Regular oxygen was gradually increased  and she was back at 99% on 4L (used2-4L PTA). Mandy [name] reports pt's compliance with meds and O2. She's not had any recent edema, syncope or CP. Upon ED eval, she was found to be hypertensive with CXR with increased moderate right pleural effusion, small stable left pleural effusion, moderate pulmonary edema. Labs showed hyperkalemia of 6.0 tx with Lokelma, Cr 1.62 (up to 1.2's), BNP 670, troponin 72->69, Hgb downtrending to 8.7, Covid/flu neg. She was given 77m IV Lasix then started on BID dosing this AM. Weight, I/O's not fully available at this time.  Past Medical History:  Diagnosis Date   Aortic atherosclerosis ([facility] 10/08/2018   Arthritis    Chronic kidney disease, stage 3a ([facility]    Chronic respiratory failure (HCC)    Diabetes mellitus without complication (HCC)    GERD (gastroesophageal reflux disease)    Hepatic steatosis 10/08/2018   Hyperlipidemia    Hypertension    Hyperthyroidism    Mass of right ovary 10/08/2018   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/w benign thyroid tissue   Mild CAD    Osteoporosis    Pneumonia    Renal infarct (HCC)    SBO (small bowel obstruction) ([facility] 08/09/2018   Severe pulmonary hypertension ([facility]    Stroke (cerebrum) (HCC)     Past Surgical History:  Procedure Laterality Date   CATARACT EXTRACTION W/ INTRAOCULAR LENS  IMPLANT, BILATERAL     COLONOSCOPY     LEFT HEART CATH AND CORONARY ANGIOGRAPHY N/A 11/30/2020   Procedure: LEFT HEART CATH AND CORONARY ANGIOGRAPHY;  Surgeon: TEarly Osmond MD;  [facility];  Service: Cardiovascular;  Laterality: N/A;   RIGHT/LEFT HEART CATH AND CORONARY ANGIOGRAPHY N/A 11/30/2020   Procedure: RIGHT/LEFT HEART CATH AND CORONARY ANGIOGRAPHY;  Surgeon: TEarly Osmond MD;  [facility];  Service: Cardiovascular;  Laterality: N/A;     Home Medications:  Prior to Admission medications   Medication Sig Start Date End Date Taking? Authorizing Provider  acetaminophen  (TYLENOL) 500 MG tablet Take 1,000 mg by mouth every 6 (six) hours as needed for fever or headache (pain).   Yes [provider]  albuterol (PROVENTIL) (2.5 MG/3ML) 0.083% nebulizer solution Take 2.5 mg by nebulization every 6 (six) hours as needed for wheezing or shortness of breath.   Yes [provider]  albuterol (VENTOLIN HFA) 108 (90 Base) MCG/ACT inhaler Inhale 2 puffs into the lungs every 6 (six) hours as needed for wheezing or shortness of breath. 06/30/20  Yes SThurnell Lose MD  alendronate (FOSAMAX) 70 MG tablet Take 70 mg by mouth every Tuesday. Take with a full glass of water on an empty stomach.   Yes [provider]  atenolol (TENORMIN) 50 MG tablet Take 50 mg by mouth 2 (two) times daily.   Yes [provider]  calcium carbonate (OSCAL) 1500 (600 Ca) MG TABS tablet Take 600 mg of elemental calcium by mouth 2 (two) times daily with a meal.   Yes [provider]  cetirizine (ZYRTEC) 10 MG tablet Take 10 mg by mouth daily. 12/31/20  Yes [provider]  clopidogrel (PLAVIX) 75 MG tablet Take 1 tablet (75 mg total) by mouth daily. 12/08/20  Yes KBonnielee Haff MD  diclofenac sodium (VOLTAREN) 1 % GEL Apply 4 g topically 4 (four) times  daily as needed for pain. shoulder 09/13/18  Yes [provider]  gabapentin (NEURONTIN) 100 MG capsule Take 100 mg by mouth at bedtime. 07/18/18  Yes [provider]  hydrALAZINE (APRESOLINE) 100 MG tablet Take 0.5 tablets (50 mg total) by mouth 3 (three) times daily. 10/09/20  Yes Nicole Kindred A, DO  melatonin 5 MG TABS Take 10 mg by mouth at bedtime.   Yes [provider]  methimazole (TAPAZOLE) 5 MG tablet Take 0.5 tablets (2.5 mg total) by mouth daily. 02/04/19  Yes Hosie Poisson, MD  Multiple Vitamin (MULTIVITAMIN WITH MINERALS) TABS tablet Take 1 tablet by mouth every morning.   Yes [provider]  Omega-3 Fatty Acids (FISH OIL PO) Take 1 capsule by mouth every  morning.   Yes [provider]  OXYGEN Inhale 4 L into the lungs continuous.   Yes [provider]  pantoprazole (PROTONIX) 40 MG tablet Take 1 tablet (40 mg total) by mouth daily at 6 (six) AM. Patient taking differently: Take 40 mg by mouth every morning. 06/30/20  Yes Thurnell Lose, MD  potassium chloride SA (KLOR-CON M) 20 MEQ tablet Take 1 tablet (20 mEq total) by mouth daily. 12/08/20  Yes Bonnielee Haff, MD  simvastatin (ZOCOR) 40 MG tablet Take 1 tablet (40 mg total) by mouth at bedtime. 12/08/20  Yes Bonnielee Haff, MD  torsemide 40 MG TABS Take 40 mg by mouth in the morning and at bedtime. 01/25/21 02/24/21 Yes Baldwin [place], PA-C  vitamin B-12 (CYANOCOBALAMIN) 1000 MCG tablet Take 1,000 mcg by mouth daily.   Yes [provider]  vitamin C (ASCORBIC ACID) 500 MG tablet Take 500 mg by mouth daily.   Yes [provider]  Vitamin D, Cholecalciferol, 25 MCG (1000 UT) TABS Take 1,000 Units by mouth daily.   Yes [provider]  zinc gluconate 50 MG tablet Take 50 mg by mouth daily.   Yes [provider]  diazepam (VALIUM) 2 MG tablet Take 2 mg by mouth every 12 (twelve) hours as needed for anxiety. Patient not taking: Reported on 02/03/2021    [provider]    Inpatient Medications: Scheduled Meds:  atenolol  50 mg Oral BID   clopidogrel  75 mg Oral Daily   furosemide  40 mg Intravenous Q12H   heparin  5,000 Units Subcutaneous Q8H   hydrALAZINE  50 mg Oral TID   melatonin  10 mg Oral QHS   methimazole  2.5 mg Oral Daily   pantoprazole  40 mg Oral q morning   simvastatin  40 mg Oral QHS   sodium chloride flush  3 mL Intravenous Q12H   Continuous Infusions:  PRN Meds: acetaminophen **OR** acetaminophen, albuterol  Allergies:   No Known Allergies  Social History:   Social History   Socioeconomic History   Marital status: Widowed    Spouse name: Not on file   Number of children: Not on file   Years of  education: Not on file   Highest education level: Not on file  Occupational History   Not on file  Tobacco Use   Smoking status: Never   Smokeless tobacco: Never  Vaping Use   Vaping Use: Never used  Substance and Sexual Activity   Alcohol use: No   Drug use: No   Sexual activity: Not on file  Other Topics Concern   Not on file  Social History Narrative   Patient is from [place]   Speaks [place]  Social Determinants of Health   Financial Resource Strain: Not on file  Food Insecurity: Not on file  Transportation Needs: Not on file  Physical Activity: Not on file  Stress: Not on file  Social Connections: Not on file  Intimate Partner Violence: Not on file    Family History:    Family History  Problem Relation Age of Onset   Kidney disease Son    Colon cancer Neg Hx    Breast cancer Neg Hx      ROS:  Please see the history of present illness.  All other ROS reviewed and negative.     Physical Exam/Data:   Vitals:   02/03/21 0915 02/03/21 1000 02/03/21 1040 02/03/21 1100  BP: (!) 186/100 (!) 160/58 (!) 154/66 (!) 154/75  Pulse: 65 70  65  Resp: 18     Temp:      TempSrc:      SpO2: 100% 99%  100%    Intake/Output Summary (Last 24 hours) at 02/03/2021 1214 Last data filed at 02/03/2021 4628 Gross per 24 hour  Intake --  Output 600 ml  Net -600 ml   Last 3 Weights 01/25/2021 01/14/2021 01/08/2021  Weight (lbs) 167 lb 6.4 oz 167 lb 3.2 oz 165 lb  Weight (kg) 75.932 kg 75.841 kg 74.844 kg     There is no height or weight on file to calculate BMI.  General: Well developed, well nourished, in no acute distress. Head: Normocephalic, atraumatic, sclera non-icteric, no xanthomas, nares are without discharge. Round facial features. Neck: Negative for carotid bruits. JVP not elevated. Lungs: Increased WOB, poor inspiratory effort. Moderately decreased BS at base R side, mild on L side. Generally coarse BS throughout. Heart: RRR S1 S2 without murmurs, rubs, or  gallops.  Abdomen: Soft, non-tender, non-distended with normoactive bowel sounds. No rebound/guarding. Extremities: No clubbing or cyanosis. No edema. Distal pedal pulses are 2+ and equal bilaterally. Neuro: Alert and oriented X 3. Moves all extremities spontaneously. Psych:  Responds to questions appropriately with a normal affect.   EKG:  The EKG was personally reviewed and demonstrates:  NSR 70bpm, nonspecific STTW changes  Telemetry:  Telemetry was personally reviewed and demonstrates:  NSR  Relevant CV Studies: Echo 12/15/20  1. Left ventricular ejection fraction, by estimation, is 60 to 65%. The  left ventricle has normal function. The left ventricle has no regional  wall motion abnormalities. There is moderate concentric left ventricular  hypertrophy. Left ventricular  diastolic parameters are consistent with Grade II diastolic dysfunction  (pseudonormalization). Elevated left atrial pressure. There is the  interventricular septum is flattened in systole and diastole, consistent  with right ventricular pressure and volume   overload.   2. Right ventricular systolic function is mildly reduced. The right  ventricular size is normal. There is severely elevated pulmonary artery  systolic pressure.   3. Left atrial size was moderately dilated.   4. Right atrial size was mildly dilated.   5. The mitral valve is normal in structure. Trivial mitral valve  regurgitation. No evidence of mitral stenosis.   6. Tricuspid valve regurgitation is moderate.   7. The aortic valve is tricuspid. Aortic valve regurgitation is not  visualized. No aortic stenosis is present.   8. The inferior vena cava is dilated in size with <50% respiratory  variability, suggesting right atrial pressure of 15 mmHg.   Comparison(s): No significant change from prior study. Prior images  reviewed side by side.   Cath 11/2020   Mid LAD  lesion is 30% stenosed.   Dist LAD lesion is 30% stenosed.   LV end  diastolic pressure is low.   LV end diastolic pressure is normal.   The left ventricular ejection fraction is 55-65% by visual estimate.   Hemodynamic findings consistent with moderate pulmonary hypertension.   1.  Mild obstructive coronary artery disease. 2.  Preserved cardiac output of 6.1 L/min and an index of 3.55 L/min/m. 3.  Mean pulmonary artery pressure of 39 mmHg with pulmonary vascular resistance of 3.44 Woods units; transpulmonary gradient of 25mHg (mean wedge pressure 148mg) 4.  LVEDP of 9 mmHg with preserved LVEF.   Recommendation: Medical therapy.    Laboratory Data:  High Sensitivity Troponin:   Recent Labs  Lab 02/03/21 0007 02/03/21 0316  TROPONINIHS 72* 59*     Chemistry Recent Labs  Lab 02/03/21 0007 02/03/21 0532  NA 134* 136  K 6.0* 4.0  CL 86* 86*  CO2 41* 44*  GLUCOSE 144* 133*  BUN 47* 45*  CREATININE 1.62* 1.56*  CALCIUM 8.9 9.1  MG  --  2.7*  GFRNONAA 32* 34*  ANIONGAP 7 6    No results for input(s): PROT, ALBUMIN, AST, ALT, ALKPHOS, BILITOT in the last 168 hours. Lipids No results for input(s): CHOL, TRIG, HDL, LABVLDL, LDLCALC, CHOLHDL in the last 168 hours.  Hematology Recent Labs  Lab 02/03/21 0007 02/03/21 0532  WBC 8.1 6.6  RBC 3.63* 3.37*  HGB 9.4* 8.7*  HCT 30.5* 28.5*  MCV 84.0 84.6  MCH 25.9* 25.8*  MCHC 30.8 30.5  RDW 16.0* 16.1*  PLT 266 237   Thyroid No results for input(s): TSH, FREET4 in the last 168 hours.  BNP Recent Labs  Lab 02/03/21 0007  BNP 670.8*    DDimer No results for input(s): DDIMER in the last 168 hours.   Radiology/Studies:  DG Chest Port 1 View  Result Date: 02/03/2021 CLINICAL DATA:  Shortness of breath. EXAM: PORTABLE CHEST 1 VIEW COMPARISON:  Chest x-ray 12/15/2020. FINDINGS: The heart is enlarged, unchanged. There is central pulmonary vascular congestion with perihilar opacities. Moderate right pleural effusion has increased. Small left pleural effusion is stable. There is no  pneumothorax or acute fracture. IMPRESSION: 1. Cardiomegaly with moderate pulmonary edema. 2. Moderate right pleural effusion has increased. Small left pleural effusion is stable. Electronically Signed   By: AmRonney Asters.D.   On: 02/03/2021 01:06   SLEEP STUDY DOCUMENTS  Result Date: 02/03/2021 Ordered by an unspecified provider.    Assessment and Plan:   1. Recurrent acute on chronic hypoxic respiratory failure and recurrent right pleural effusion with acute on chronic diastolic CHF and severe pulmonary HTN 2. Essential HTN, hypertensive on arrival 3. AKI on CKD stage 3a 4. Hyperkalemia, resolved 5. 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 Nonobstructive CAD by cath 11/2020 7. Thyroid disease, repeat TSH pending 8. Chronic appearing anemia, recent range 7-9  Very complex patient with multiple presentations for similar issues - this is admission #6 with similar problems maintaining oxygenation and euvolemia. Etiology of pulmonary HTN has been felt due to group 2 & 3. Prior RHC reviewed as above. Sleep study was attempted but she developed worsening hypoxia in the setting of recurrent right pleural effusion. PFTs are pending. No prior hx of VQ scan, may need to consider. Hi-res CT may be of value as well. May need multidisciplinary care with pulmonology and advanced HF consultation. Will order myeloma panel and uIFE with  AM labs, query consideration of amyloid process. I will discuss her complex case with Dr. Gardiner [name]. Further recs forthcoming.   Risk Assessment/Risk Scores:       New York Heart Association (NYHA) Functional Class NYHA Class IV   For questions or updates, please contact [facility] Please consult [email or web address] for contact info under    Signed, Charlie Pitter, PA-C  02/03/2021 12:14 PM  Patient seen and examined.  Agree with above documentation.  Mandy [name] is a 81 year old female with a history of chronic diastolic heart  failure, O8NO, anterior mediastinal mass status postresection 09/2020, severe pulmonary hypertension, CKD stage III AAA, chronic respiratory failure on home O2, nonobstructive CAD, cryptogenic CVA 12/2020 who we are consulted for evaluation of heart failure at the request of Dr Mandy [name].  She has had multiple recent admissions for acute on chronic respiratory failure.  Intermittently has required BiPAP as well as multiple thoracentesis procedures.  Echocardiogram 11/2020 showed EF 55 to 60%, severe pulmonary hypertension with RVSP 83.  Underwent RHC/LHC on 11/22/2020 which showed nonobstructive CAD, moderate pulm hypertension, LVEDP 9.  Pulmonology was consulted and felt likely had an OHS/OSA.  She was admitted in December and found to have subacute CVA.  She was started on aspirin and Plavix x3 weeks with plan to do Plavix alone moving forward.  Echocardiogram 12/2020 showed EF 60 to 67%, mild RVE systolic dysfunction, severe pulmonary hypertension, interventricular septal flattening consistent with RV pressure and volume overload.  She has been referred to Dr. Silas [name] for her pulmonary hypertension.  She presented to the ED yesterday.  She has had worsening weakness.  Noted to have SPO2 dropping to 80s.  In the ED, initial vital signs notable for BP 135/90, pulse 72, SPO2 99 percent on 4 L.  Labs notable for creatinine 1.62 (increased from 1.21 on 1/23), potassium 6.0 (improved to 4.0 today), sodium 134, bicarb 41, BNP 671, troponin 72 > 59, hemoglobin 9.4, WBC 8.1, platelets 266.  Chest x-ray shows cardiomegaly with moderate pulmonary edema and moderate right pleural effusion, small left pleural effusion.  EKG shows normal sinus rhythm, rate 78, less than 1 mm ST depressions in leads III, aVF.  On exam, patient is alert and oriented, regular rate and rhythm, no murmurs, bibasilar crackles, no LE edema.  For her acute on chronic diastolic heart failure, would continue IV Lasix.  Given her LVH and diastolic heart  failure, recommend amyloid evaluation at some point.  Will check AL amyloid labs.  Plan likely cardiac MRI once more euvolemic.  Mandy Heinz, MD

## 2021-02-04 DIAGNOSIS — J9621 Acute and chronic respiratory failure with hypoxia: Secondary | ICD-10-CM | POA: Diagnosis not present

## 2021-02-04 DIAGNOSIS — N179 Acute kidney failure, unspecified: Secondary | ICD-10-CM

## 2021-02-04 DIAGNOSIS — I272 Pulmonary hypertension, unspecified: Secondary | ICD-10-CM | POA: Diagnosis not present

## 2021-02-04 DIAGNOSIS — E0590 Thyrotoxicosis, unspecified without thyrotoxic crisis or storm: Secondary | ICD-10-CM

## 2021-02-04 DIAGNOSIS — I5033 Acute on chronic diastolic (congestive) heart failure: Secondary | ICD-10-CM | POA: Diagnosis not present

## 2021-02-04 DIAGNOSIS — J9622 Acute and chronic respiratory failure with hypercapnia: Secondary | ICD-10-CM | POA: Diagnosis not present

## 2021-02-04 DIAGNOSIS — N1831 Chronic kidney disease, stage 3a: Secondary | ICD-10-CM

## 2021-02-04 LAB — BLOOD GAS, ARTERIAL
Acid-Base Excess: 15.3 mmol/L — ABNORMAL HIGH (ref 0.0–2.0)
Allens test (pass/fail): POSITIVE — AB
Bicarbonate: 42.6 mmol/L — ABNORMAL HIGH (ref 20.0–28.0)
Drawn by: 336832
O2 Saturation: 93.9 %
Patient temperature: 98.6
pCO2 arterial: 77 mmHg (ref 32.0–48.0)
pH, Arterial: 7.361 (ref 7.350–7.450)
pO2, Arterial: 74.8 mmHg — ABNORMAL LOW (ref 83.0–108.0)

## 2021-02-04 LAB — CBC
HCT: 28.1 % — ABNORMAL LOW (ref 36.0–46.0)
Hemoglobin: 8.4 g/dL — ABNORMAL LOW (ref 12.0–15.0)
MCH: 25.7 pg — ABNORMAL LOW (ref 26.0–34.0)
MCHC: 29.9 g/dL — ABNORMAL LOW (ref 30.0–36.0)
MCV: 85.9 fL (ref 80.0–100.0)
Platelets: 222 10*3/uL (ref 150–400)
RBC: 3.27 MIL/uL — ABNORMAL LOW (ref 3.87–5.11)
RDW: 16 % — ABNORMAL HIGH (ref 11.5–15.5)
WBC: 6.5 10*3/uL (ref 4.0–10.5)
nRBC: 0 % (ref 0.0–0.2)

## 2021-02-04 LAB — BASIC METABOLIC PANEL
Anion gap: 8 (ref 5–15)
Anion gap: 7 (ref 5–15)
BUN: 45 mg/dL — ABNORMAL HIGH (ref 8–23)
BUN: 44 mg/dL — ABNORMAL HIGH (ref 8–23)
CO2: 43 mmol/L — ABNORMAL HIGH (ref 22–32)
CO2: 43 mmol/L — ABNORMAL HIGH (ref 22–32)
Calcium: 8.8 mg/dL — ABNORMAL LOW (ref 8.9–10.3)
Calcium: 8.9 mg/dL (ref 8.9–10.3)
Chloride: 86 mmol/L — ABNORMAL LOW (ref 98–111)
Chloride: 88 mmol/L — ABNORMAL LOW (ref 98–111)
Creatinine, Ser: 1.5 mg/dL — ABNORMAL HIGH (ref 0.44–1.00)
Creatinine, Ser: 1.55 mg/dL — ABNORMAL HIGH (ref 0.44–1.00)
GFR, Estimated: 35 mL/min — ABNORMAL LOW (ref >60)
GFR, Estimated: 34 mL/min — ABNORMAL LOW (ref >60)
Glucose, Bld: 112 mg/dL — ABNORMAL HIGH (ref 70–99)
Glucose, Bld: 107 mg/dL — ABNORMAL HIGH (ref 70–99)
Potassium: 4 mmol/L (ref 3.5–5.1)
Potassium: 3.8 mmol/L (ref 3.5–5.1)
Sodium: 137 mmol/L (ref 135–145)
Sodium: 138 mmol/L (ref 135–145)

## 2021-02-04 LAB — MAGNESIUM: Magnesium: 2.6 mg/dL — ABNORMAL HIGH (ref 1.7–2.4)

## 2021-02-04 MED ORDER — FUROSEMIDE 10 MG/ML IJ SOLN
80.0000 mg | Freq: Two times a day (BID) | INTRAMUSCULAR | Status: DC
  Administered 2021-02-04 – 2021-02-05 (×2): 80 mg via INTRAVENOUS
  Filled 2021-02-04 (×2): qty 8

## 2021-02-04 MED ORDER — CHLORHEXIDINE GLUCONATE 0.12 % MT SOLN
15.0000 mL | Freq: Two times a day (BID) | OROMUCOSAL | Status: DC
  Administered 2021-02-04 – 2021-02-09 (×11): 15 mL via OROMUCOSAL
  Filled 2021-02-04 (×10): qty 15

## 2021-02-04 MED ORDER — FUROSEMIDE 10 MG/ML IJ SOLN
40.0000 mg | Freq: Once | INTRAMUSCULAR | Status: AC
  Administered 2021-02-04: 40 mg via INTRAVENOUS
  Filled 2021-02-04: qty 4

## 2021-02-04 MED ORDER — ALUM & MAG HYDROXIDE-SIMETH 200-200-20 MG/5ML PO SUSP
15.0000 mL | ORAL | Status: DC | PRN
  Administered 2021-02-04 – 2021-02-05 (×3): 15 mL via ORAL
  Filled 2021-02-04 (×3): qty 30

## 2021-02-04 MED ORDER — CHLORHEXIDINE GLUCONATE CLOTH 2 % EX PADS
6.0000 | MEDICATED_PAD | Freq: Every day | CUTANEOUS | Status: DC
  Administered 2021-02-05 – 2021-02-22 (×16): 6 via TOPICAL

## 2021-02-04 MED ORDER — ORAL CARE MOUTH RINSE
15.0000 mL | Freq: Two times a day (BID) | OROMUCOSAL | Status: DC
  Administered 2021-02-04 – 2021-02-09 (×8): 15 mL via OROMUCOSAL

## 2021-02-04 NOTE — Progress Notes (Signed)
Patient with inadequate urine output overnight. Bladder scan showed 68 ml. Patient states she does not have the urge to void. Abdomen is soft. Will continue to monitor closely.

## 2021-02-04 NOTE — Plan of Care (Signed)
Weaned pt to 2L oxygen from 4L. O2 sats dropped to 92%, but with passage of a few more minutes pt felt SOB and O2 sats dropped to 84%. MD aware. Pt restored to 4L oxygen and O2 sats improved to 97%   Problem: Education: Goal: Ability to demonstrate management of disease process will improve Outcome: Progressing Goal: Ability to verbalize understanding of medication therapies will improve Outcome: Progressing Goal: Individualized Educational Video(s) Outcome: Progressing   Problem: Activity: Goal: Capacity to carry out activities will improve Outcome: Progressing   Problem: Cardiac: Goal: Ability to achieve and maintain adequate cardiopulmonary perfusion will improve Outcome: Progressing   Problem: Education: Goal: Knowledge of General Education information will improve Description: Including pain rating scale, medication(s)/side effects and non-pharmacologic comfort measures Outcome: Progressing   Problem: Health Behavior/Discharge Planning: Goal: Ability to manage health-related needs will improve Outcome: Progressing   Problem: Clinical Measurements: Goal: Ability to maintain clinical measurements within normal limits will improve Outcome: Progressing Goal: Will remain free from infection Outcome: Progressing Goal: Diagnostic test results will improve Outcome: Progressing Goal: Respiratory complications will improve Outcome: Progressing Goal: Cardiovascular complication will be avoided Outcome: Progressing   Problem: Activity: Goal: Risk for activity intolerance will decrease Outcome: Progressing   Problem: Nutrition: Goal: Adequate nutrition will be maintained Outcome: Progressing   Problem: Coping: Goal: Level of anxiety will decrease Outcome: Progressing   Problem: Elimination: Goal: Will not experience complications related to bowel motility Outcome: Progressing Goal: Will not experience complications related to urinary retention Outcome: Progressing    Problem: Pain Managment: Goal: General experience of comfort will improve Outcome: Progressing   Problem: Safety: Goal: Ability to remain free from injury will improve Outcome: Progressing   Problem: Skin Integrity: Goal: Risk for impaired skin integrity will decrease Outcome: Progressing

## 2021-02-04 NOTE — Progress Notes (Signed)
PROGRESS NOTE    Mandy [name]  WGN:[id number] DOB: 02-15-1940 DOA: 02/02/2021  PCP: [name], MD   Brief Narrative:  This 81 years old female with PMH significant for HFp EF, severe pulmonary hypertension, chronic hypoxic and hypercapnic respiratory failure, CKD stage IIIa, hypothyroidism, anxiety, CAD, GERD, cryptogenic CVA presented in the ED from sleep center where she reportedly had a drop in oxygen saturation to 30s for 10 minutes.  Patient reports increasing shortness of breath for last 2 to 3 weeks, reports increase in her oxygen requirement from 2 to 4 L/min, She reported increasing torsemide without any improvement.  Chest x-ray  showed cardiomegaly with moderate pulmonary edema, moderate right pleural effusion which has increased and stable left small pleural effusion.  She was found to have hyperkalemia which has improved with IV diuresis. Patient is admitted for acute on chronic diastolic CHF,  acute on chronic hypoxic and hypercapnic respiratory failure.  Cardiology is consulted, recommended to continue aggressive diuresis.  Assessment & Plan:   Principal Problem:   Acute on chronic respiratory failure with hypoxia and hypercapnia (HCC) Active Problems:   Diabetes mellitus type 2, controlled ([place])   Hyperthyroidism   Hyperkalemia   Acute renal failure superimposed on stage 3a chronic kidney disease (HCC)   Severe pulmonary hypertension (HCC)   Acute on chronic diastolic CHF (congestive heart failure) (HCC)  Acute on chronic diastolic CHF: Acute on chronic hypoxic and hypercarbic respiratory failure. Patient with HFp EF and severe pulmonary hypertension sent from sleep study after her O2 sats dropped to 30% for 10 minutes. She has been using 2 to 4 L of oxygen at home but increasingly short of breath despite recent increase in diuretics. She is found to have pulmonary edema,  increased right pleural effusion noted on chest x-ray. Cardiology is consulted,  recommended to  increase Lasix to 80 mg IV every 12 hours. Daily weight intake and output charting. Myeloma work-up for consideration of a myeloid process is in process. Pulmonology consulted, given increasing PCO2 on ABG. Consider trial of BiPAP for work of breathing, hypercarbia is likely chronic as she is not acidotic. Continue diuresis, may require outpatient PFTs and sleep studies when able.  AKI on CKD stage IIIa /hyperkalemia: Serum creatinine on admission 1.62, baseline serum creatinine 1.21.  Potassium 6.0 She is significantly hypervolemic on exam, being treated with IV Lasix and Lokelma. Potassium normalized, continue IV Lasix.  Continue to monitor serum creatinine.  CAD: Denies any chest pain, Trop 27>25 likely due to demand ischemia. Continue Zocor and Plavix  History of CVA: Not on A-fib on ambulatory monitoring. Continue Plavix and Zocor  Essential hypertension: Continue atenolol hydralazine and Lasix.   DVT prophylaxis: Heparin sq Code Status: Full code. Family Communication: No family at bed side. Disposition Plan:   Status is: Inpatient Remains inpatient appropriate because: Admitted for acute hypoxic and hypercapnic respiratory failure secondary to COPD and CHF exacerbation. Consultants:  Pulmonology Cardiology  Procedures: None Antimicrobials:None   Subjective: Patient was seen and examined at bedside.  Overnight events noted.   Patient seems confused.  She is following full commands.  She appears volume overloaded.  Objective: Vitals:   02/04/21 1006 02/04/21 1009 02/04/21 1316 02/04/21 1428  BP:   140/60   Pulse:      Resp:   (!) 22 (!) 26  Temp:   98.7 F (37.1 C)   TempSrc:   Oral   SpO2: (!) 84% 95% 96% 98%  Weight:      Height:  Intake/Output Summary (Last 24 hours) at 02/04/2021 1503 Last data filed at 02/04/2021 1400 Gross per 24 hour  Intake 240 ml  Output 550 ml  Net -310 ml   Filed Weights   02/03/21 1823 02/04/21 0611  Weight: 74.8 kg  74.2 kg    Examination:  General exam: Comfortable, not in any acute distress, deconditioned Respiratory system: Increased work of breathing, poor inspiratory effort, decreased breath sounds bilaterally. Cardiovascular system: S1-S2 heard, regular rate and rhythm, no murmur.   Gastrointestinal system: Abdomen is soft, nontender, nondistended, BS+ Central nervous system: Alert and oriented x2. No focal neurological deficits. Extremities: Edema +, no cyanosis no clubbing Skin: No rashes, lesions or ulcers Psychiatry: Judgement and insight appear normal. Mood & affect appropriate.     Data Reviewed: I have personally reviewed following labs and imaging studies  CBC: Recent Labs  Lab 02/03/21 0007 02/03/21 0532 02/04/21 0703  WBC 8.1 6.6 6.5  HGB 9.4* 8.7* 8.4*  HCT 30.5* 28.5* 28.1*  MCV 84.0 84.6 85.9  PLT 266 237 295   Basic Metabolic Panel: Recent Labs  Lab 02/03/21 0007 02/03/21 0532 02/03/21 2019 02/04/21 0703  NA 134* 136 138 137  K 6.0* 4.0 4.1 4.0  CL 86* 86* 88* 86*  CO2 41* 44* 42* 43*  GLUCOSE 144* 133* 139* 112*  BUN 47* 45* 44* 45*  CREATININE 1.62* 1.56* 1.44* 1.50*  CALCIUM 8.9 9.1 8.8* 8.8*  MG  --  2.7*  --  2.6*   GFR: Estimated Creatinine Clearance: 26.7 mL/min (A) (by C-G formula based on SCr of 1.5 mg/dL (H)). Liver Function Tests: No results for input(s): AST, ALT, ALKPHOS, BILITOT, PROT, ALBUMIN in the last 168 hours. No results for input(s): LIPASE, AMYLASE in the last 168 hours. No results for input(s): AMMONIA in the last 168 hours. Coagulation Profile: No results for input(s): INR, PROTIME in the last 168 hours. Cardiac Enzymes: No results for input(s): CKTOTAL, CKMB, CKMBINDEX, TROPONINI in the last 168 hours. BNP (last 3 results) Recent Labs    01/14/21 1653  PROBNP 443.0*   HbA1C: No results for input(s): HGBA1C in the last 72 hours. CBG: No results for input(s): GLUCAP in the last 168 hours. Lipid Profile: No results for  input(s): CHOL, HDL, LDLCALC, TRIG, CHOLHDL, LDLDIRECT in the last 72 hours. Thyroid Function Tests: No results for input(s): TSH, T4TOTAL, FREET4, T3FREE, THYROIDAB in the last 72 hours. Anemia Panel: No results for input(s): VITAMINB12, FOLATE, FERRITIN, TIBC, IRON, RETICCTPCT in the last 72 hours. Sepsis Labs: No results for input(s): PROCALCITON, LATICACIDVEN in the last 168 hours.  Recent Results (from the past 240 hour(s))  Resp Panel by RT-PCR (Flu A&B, Covid) Nasopharyngeal Swab     Status: None   Collection Time: 02/03/21  3:39 AM   Specimen: Nasopharyngeal Swab; Nasopharyngeal(NP) swabs in vial transport medium  Result Value Ref Range Status   SARS Coronavirus 2 by RT PCR NEGATIVE NEGATIVE Final    Comment: (NOTE) SARS-CoV-2 target nucleic acids are NOT DETECTED.  The SARS-CoV-2 RNA is generally detectable in upper respiratory specimens during the acute phase of infection. The lowest concentration of SARS-CoV-2 viral copies this assay can detect is 138 copies/mL. A negative result does not preclude SARS-Cov-2 infection and should not be used as the sole basis for treatment or other patient management decisions. A negative result may occur with  improper specimen collection/handling, submission of specimen other than nasopharyngeal swab, presence of viral mutation(s) within the areas targeted by this assay, and  inadequate number of viral copies(<138 copies/mL). A negative result must be combined with clinical observations, patient history, and epidemiological information. The expected result is Negative.  Fact Sheet for Patients:  EntrepreneurPulse.com.au  Fact Sheet for Healthcare Providers:  IncredibleEmployment.be  This test is no t yet approved or cleared by the [place] FDA and  has been authorized for detection and/or diagnosis of SARS-CoV-2 by FDA under an Emergency Use Authorization (EUA). This EUA will remain  in effect  (meaning this test can be used) for the duration of the COVID-19 declaration under Section 564(b)(1) of the Act, 21 U.S.C.section 360bbb-3(b)(1), unless the authorization is terminated  or revoked sooner.       Influenza A by PCR NEGATIVE NEGATIVE Final   Influenza B by PCR NEGATIVE NEGATIVE Final    Comment: (NOTE) The Xpert Xpress SARS-CoV-2/FLU/RSV plus assay is intended as an aid in the diagnosis of influenza from Nasopharyngeal swab specimens and should not be used as a sole basis for treatment. Nasal washings and aspirates are unacceptable for Xpert Xpress SARS-CoV-2/FLU/RSV testing.  Fact Sheet for Patients: EntrepreneurPulse.com.au  Fact Sheet for Healthcare Providers: IncredibleEmployment.be  This test is not yet approved or cleared by the [place] FDA and has been authorized for detection and/or diagnosis of SARS-CoV-2 by FDA under an Emergency Use Authorization (EUA). This EUA will remain in effect (meaning this test can be used) for the duration of the COVID-19 declaration under Section 564(b)(1) of the Act, 21 U.S.C. section 360bbb-3(b)(1), unless the authorization is terminated or revoked.  Performed at [facility], [place] [address] [place], [place] [address]    Radiology Studies: [facility] Chest Port 1 View  Result Date: 02/03/2021 CLINICAL DATA:  Shortness of breath. EXAM: PORTABLE CHEST 1 VIEW COMPARISON:  Chest x-ray 12/15/2020. FINDINGS: The heart is enlarged, unchanged. There is central pulmonary vascular congestion with perihilar opacities. Moderate right pleural effusion has increased. Small left pleural effusion is stable. There is no pneumothorax or acute fracture. IMPRESSION: 1. Cardiomegaly with moderate pulmonary edema. 2. Moderate right pleural effusion has increased. Small left pleural effusion is stable. Electronically Signed   By: Ronney Asters M.D.   On: 02/03/2021 01:06   SLEEP STUDY  DOCUMENTS  Result Date: 02/03/2021 Ordered by an unspecified provider.   Scheduled Meds:  atenolol  50 mg Oral BID   clopidogrel  75 mg Oral Daily   furosemide  80 mg Intravenous BID   heparin  5,000 Units Subcutaneous Q8H   hydrALAZINE  50 mg Oral TID   mouth rinse  15 mL Mouth Rinse BID   melatonin  10 mg Oral QHS   methimazole  2.5 mg Oral Daily   pantoprazole  40 mg Oral q morning   simvastatin  40 mg Oral QHS   sodium chloride flush  3 mL Intravenous Q12H   Continuous Infusions:   LOS: 1 day    Time spent: 50 mins    Terralyn Matsumura, MD [facility]   If 7PM-7AM, please contact night-coverage

## 2021-02-04 NOTE — Plan of Care (Signed)
°  Problem: Activity: Goal: Capacity to carry out activities will improve Outcome: Progressing   Problem: Cardiac: Goal: Ability to achieve and maintain adequate cardiopulmonary perfusion will improve Outcome: Progressing   Problem: Clinical Measurements: Goal: Respiratory complications will improve Outcome: Progressing

## 2021-02-04 NOTE — Progress Notes (Addendum)
Progress Note  Patient Name: Mandy [name] Date of Encounter: 02/04/2021  Primary Cardiologist: Donato Heinz, MD  Subjective   Denies pain, breathing remains short.  Inpatient Medications    Scheduled Meds:  atenolol  50 mg Oral BID   clopidogrel  75 mg Oral Daily   furosemide  80 mg Intravenous BID   heparin  5,000 Units Subcutaneous Q8H   hydrALAZINE  50 mg Oral TID   mouth rinse  15 mL Mouth Rinse BID   melatonin  10 mg Oral QHS   methimazole  2.5 mg Oral Daily   pantoprazole  40 mg Oral q morning   simvastatin  40 mg Oral QHS   sodium chloride flush  3 mL Intravenous Q12H   Continuous Infusions:  PRN Meds: acetaminophen **OR** acetaminophen, albuterol   Vital Signs    Vitals:   02/04/21 0958 02/04/21 1002 02/04/21 1006 02/04/21 1009  BP: (!) 145/62     Pulse:      Resp:      Temp:      TempSrc:      SpO2: 97% 92% (!) 84% 95%  Weight:      Height:        Intake/Output Summary (Last 24 hours) at 02/04/2021 1128 Last data filed at 02/04/2021 0800 Gross per 24 hour  Intake 120 ml  Output 450 ml  Net -330 ml   Last 3 Weights 02/04/2021 02/03/2021 01/25/2021  Weight (lbs) 163 lb 9.3 oz 164 lb 14.5 oz 167 lb 6.4 oz  Weight (kg) 74.2 kg 74.8 kg 75.932 kg     Telemetry    NSR - Personally Reviewed  Physical Exam   GEN: No acute distress.  HEENT: Normocephalic, atraumatic, sclera non-icteric. Neck: + JVD  Cardiac: RRR no murmurs, rubs, or gallops.  Respiratory: Mildly increased WOB, poor inspiratory effort. Moderately decreased BS at bases. No wheezing or rhonchi.  GI: Soft, nontender, non-distended, BS +x 4. MS: no deformity. Extremities: No clubbing or cyanosis. No edema. Distal pedal pulses are 2+ and equal bilaterally. Neuro:  Sleeping initially but arousable and remained awake. Follows commands. Psych:  Calm affect  Labs    High Sensitivity Troponin:   Recent Labs  Lab 02/03/21 0007 02/03/21 0316  TROPONINIHS 72* 59*      Cardiac  EnzymesNo results for input(s): TROPONINI in the last 168 hours. No results for input(s): TROPIPOC in the last 168 hours.   Chemistry Recent Labs  Lab 02/03/21 0532 02/03/21 2019 02/04/21 0703  NA 136 138 137  K 4.0 4.1 4.0  CL 86* 88* 86*  CO2 44* 42* 43*  GLUCOSE 133* 139* 112*  BUN 45* 44* 45*  CREATININE 1.56* 1.44* 1.50*  CALCIUM 9.1 8.8* 8.8*  GFRNONAA 34* 37* 35*  ANIONGAP 6 8 8      Hematology Recent Labs  Lab 02/03/21 0007 02/03/21 0532 02/04/21 0703  WBC 8.1 6.6 6.5  RBC 3.63* 3.37* 3.27*  HGB 9.4* 8.7* 8.4*  HCT 30.5* 28.5* 28.1*  MCV 84.0 84.6 85.9  MCH 25.9* 25.8* 25.7*  MCHC 30.8 30.5 29.9*  RDW 16.0* 16.1* 16.0*  PLT 266 237 222    BNP Recent Labs  Lab 02/03/21 0007  BNP 670.8*     DDimer No results for input(s): DDIMER in the last 168 hours.   Radiology    DG Chest Port 1 View  Result Date: 02/03/2021 CLINICAL DATA:  Shortness of breath. EXAM: PORTABLE CHEST 1 VIEW COMPARISON:  Chest x-ray 12/15/2020. FINDINGS:  The heart is enlarged, unchanged. There is central pulmonary vascular congestion with perihilar opacities. Moderate right pleural effusion has increased. Small left pleural effusion is stable. There is no pneumothorax or acute fracture. IMPRESSION: 1. Cardiomegaly with moderate pulmonary edema. 2. Moderate right pleural effusion has increased. Small left pleural effusion is stable. Electronically Signed   By: Ronney Asters M.D.   On: 02/03/2021 01:06   SLEEP STUDY DOCUMENTS  Result Date: 02/03/2021 Ordered by an unspecified provider.   Cardiac Studies   2d echo 12/15/20   1. Left ventricular ejection fraction, by estimation, is 60 to 65%. The  left ventricle has normal function. The left ventricle has no regional  wall motion abnormalities. There is moderate concentric left ventricular  hypertrophy. Left ventricular  diastolic parameters are consistent with Grade II diastolic dysfunction  (pseudonormalization). Elevated left atrial  pressure. There is the  interventricular septum is flattened in systole and diastole, consistent  with right ventricular pressure and volume   overload.   2. Right ventricular systolic function is mildly reduced. The right  ventricular size is normal. There is severely elevated pulmonary artery  systolic pressure.   3. Left atrial size was moderately dilated.   4. Right atrial size was mildly dilated.   5. The mitral valve is normal in structure. Trivial mitral valve  regurgitation. No evidence of mitral stenosis.   6. Tricuspid valve regurgitation is moderate.   7. The aortic valve is tricuspid. Aortic valve regurgitation is not  visualized. No aortic stenosis is present.   8. The inferior vena cava is dilated in size with <50% respiratory  variability, suggesting right atrial pressure of 15 mmHg.   Comparison(s): No significant change from prior study. Prior images  reviewed side by side.   Patient Profile     81 y.o. 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 Has hx of prior Covid PNA with hypoxemia in 02/2019 as well as 06/2020. 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 Between 09/2020 and now has had multiple readmissions for recurrent a/c respiratory failure and pleural effusions. Admitted with same 02/03/21, transported from sleep center with hypoxia.  Assessment & Plan    1. Recurrent acute on chronic hypoxic respiratory failure and recurrent right pleural effusion with acute on chronic diastolic CHF and severe pulmonary HTN - weight down 1lb, UOP not impressive - this AM has about 300cc in canister - d/w Dr. Gardiner Rhyme -> titrate IV Lasix to 80mg   BID and follow response - myeloma labs pending for consideration of amyloid process - may need to consider Advanced HF/pulm consultation while inpatient given complex readmissions  - outpatient PFTs pending - likely needs to repeat sleep study when more euvolemic as she had hypoxia in the setting of recumbency for study - consider VQ when more adequately diuresed  2. Essential HTN - BP acceptable, manage in context above  3. AKI on CKD stage 3a with hyperkalemia - K normal, Cr remains similar to admit values  - continue to follow  4. H/o cryptogenic stroke 12/2020 as well as question of prior renal infarct 2020  - continue Plavix - need to consider loop recorder when more stable (pt/family wanted to hold off earlier this month)  5. Nonobstructive CAD  by cath 11/2020 - minimally elevated troponin likely demand process  6. Thyroid disease - TSH did not get added on, will re-order for AM labs  7. Chronic appearing anemia, recent range 7-9 - similar this admission  For questions or updates, please contact [place] Please consult [email or web address] for contact info under Cardiology/STEMI.  Signed, Charlie Pitter, PA-C 02/04/2021, 11:28 AM    Patient seen and examined.  Agree with above documentation.  On exam, patient is alert, oiented x2 (baseline per daughter), regular rate and rhythm, no murmurs, diminished breath sounds, + JVD.  Insufficient diuresis on IV Lasix 40 mg, will increase to 80 mg twice daily.  She is a little more somnolent this morning, will check ABG.Donato Heinz, MD

## 2021-02-04 NOTE — Progress Notes (Signed)
eLink Physician-Brief Progress Note Patient Name: Mandy [name] DOB: 03-16-1940 MRN: [id number]   Date of Service  02/04/2021  HPI/Events of Note  Patient admitted with acute on chronic hypoxemic  / hypercapnic respiratory failure secondary to congestive heart failure and COPD exacerbation, patient also has a history of severe pulmonary hypertension. She was transferred to the ICU due to her requirement for BIPAP.  eICU Interventions  New Patient Evaluation.        Frederik Pear 02/04/2021, 9:39 PM

## 2021-02-04 NOTE — Consult Note (Addendum)
NAME:  Mandy [name], MRN:  [id number], DOB:  [birth year], LOS: 1 ADMISSION DATE:  02/02/2021, CONSULTATION DATE:  02/04/21 REFERRING MD:  Dwyane Dee, CHIEF COMPLAINT:  hypercarbia   History of Present Illness:  Mandy [name] is a 81 y.o. F with PMH significant for severe pulmonary HTN, HTN, HL, DM, recent pleural effusions, goiter and thyroid disease and HFpEF who presented to the ED 2/1 after desaturating during  a sleep study to 30% for about 10 minutes.  She had increased pleural effusions on CXR and was requiring 4L O2, so admitted to telemetry and treated with IV Lasix.   On 2/2 she was increasingly dyspneic and ABG showed pH 7.3 with pCO2 of 77 and PCCM was consulted.    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   Significant Hospital Events: Including procedures, antibiotic start and stop dates in addition to other pertinent events   2/1 admit to hospitalists 2/2 PCCM consult for hypercarbia  Interim History / Subjective:  Pt moderately dyspneic and tachypneic, nods that she is feeling short of breath.  O2 sats adequate on 4L   Pt speaks some English, Mandy [name] is her primary language, however per RN [place] interpreter was unable to translate her dialect.  Daughter has been able to help with translation  Objective   Blood pressure 140/60, pulse 75, temperature 98.7 F (37.1 C), temperature source Oral, resp. rate (!) 22, height 4\' 11"  (1.499 m), weight 74.2 kg, SpO2 96  %.        Intake/Output Summary (Last 24 hours) at 02/04/2021 1400 Last data filed at 02/04/2021 1300 Gross per 24 hour  Intake 240 ml  Output 450 ml  Net -210 ml   Filed Weights   02/03/21 1823 02/04/21 0611  Weight: 74.8 kg 74.2 kg    General:  well-nourished elderly F awake and sitting up in bed, dyspneic  and fatigued appearing HEENT: MM pink/moist, sclera anicteric Neuro: awake, nods to questions and following commands CV: s1s2 rrr, no m/r/g PULM:  on 4L, dyspneic and tachypneic but able to answer questions, no accessory muscle use, bilateral crackles GI: soft, bsx4 active  Extremities: warm/dry, 1+ edema  Skin: no rashes or lesions   Resolved Hospital Problem list     Assessment & Plan:   Acute Hypoxic and Hypercarbic respiratory failure likely secondary to acute on chronic HF exacerbation and pleural effusions BNP 730 initially, last echo 12/15/20 with EF 60-65% and grade II diastolic dysfunction Trop 72->59 -Trial bipap for work of breathing, hypercarbia is likely chronic as she is not acidotic,  if not improving in the next few hours will move to progressive care -continue diuresis and monitoring UOP, Lasix increased to 80mg  bid -Cardiology following, myeloma labs pending to evaluate for amyloid process -outpatient PFT's and sleep study when able, follows with [facility] -consider      Best Practice (right click and "Reselect all SmartList Selections" daily)  Per primary  Labs   CBC: Recent Labs  Lab 02/03/21 0007 02/03/21 0532 02/04/21 0703  WBC 8.1 6.6 6.5  HGB 9.4* 8.7* 8.4*  HCT 30.5* 28.5* 28.1*  MCV 84.0 84.6 85.9  PLT 266 237 144    Basic Metabolic Panel: Recent Labs  Lab 02/03/21 0007 02/03/21 0532 02/03/21 2019 02/04/21 0703  NA 134* 136 138 137  K 6.0* 4.0 4.1 4.0  CL 86* 86* 88* 86*  CO2 41* 44* 42* 43*  GLUCOSE 144* 133* 139* 112*  BUN 47* 45* 44* 45*  CREATININE 1.62* 1.56* 1.44* 1.50*  CALCIUM 8.9 9.1 8.8* 8.8*  MG   --  2.7*  --  2.6*   GFR: Estimated Creatinine Clearance: 26.7 mL/min (A) (by C-G formula based on SCr of 1.5 mg/dL (H)). Recent Labs  Lab 02/03/21 0007 02/03/21 0532 02/04/21 0703  WBC 8.1 6.6 6.5    Liver Function Tests: No results for input(s): AST, ALT, ALKPHOS, BILITOT, PROT, ALBUMIN in the last 168 hours. No results for input(s): LIPASE, AMYLASE in the last 168 hours. No results for input(s): AMMONIA in the last 168 hours.  ABG    Component Value Date/Time   PHART 7.361 02/04/2021 1232   PCO2ART 77.0 (HH) 02/04/2021 1232   PO2ART 74.8 (L) 02/04/2021 1232   HCO3 42.6 (H) 02/04/2021 1232   TCO2 47 (H) 11/30/2020 1632   O2SAT 93.9 02/04/2021 1232     Coagulation Profile: No results for input(s): INR, PROTIME in the last 168 hours.  Cardiac Enzymes: No results for input(s): CKTOTAL, CKMB, CKMBINDEX, TROPONINI in the last 168 hours.  HbA1C: Hgb A1c MFr Bld  Date/Time Value Ref Range Status  12/07/2020 01:44 AM 5.7 (H) 4.8 - 5.6 % Final    Comment:    (NOTE) Pre diabetes:          5.7%-6.4%  Diabetes:              >6.4%  Glycemic control for   <7.0% adults with diabetes   11/03/2020 06:43 PM 5.9 (H) 4.8 - 5.6 % Final    Comment:    (NOTE) Pre diabetes:          5.7%-6.4%  Diabetes:              >6.4%  Glycemic control for   <7.0% adults with diabetes     CBG: No results for input(s): GLUCAP in the last 168 hours.  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   Surgical History:   Past Surgical History:  Procedure Laterality Date   CATARACT EXTRACTION W/ INTRAOCULAR LENS  IMPLANT, BILATERAL     COLONOSCOPY     LEFT HEART CATH AND CORONARY ANGIOGRAPHY N/A 11/30/2020   Procedure: LEFT HEART CATH AND CORONARY ANGIOGRAPHY;  Surgeon: Early Osmond, MD;  Location: [facility];  Service: Cardiovascular;  Laterality: N/A;   RIGHT/LEFT HEART CATH AND CORONARY ANGIOGRAPHY N/A 11/30/2020   Procedure: RIGHT/LEFT HEART CATH AND CORONARY ANGIOGRAPHY;  Surgeon: Early Osmond, MD;  Location: [facility];  Service: Cardiovascular;  Laterality: N/A;     Social History:   reports that she has never smoked. She has never used smokeless tobacco. She reports that she does not drink alcohol and  does not use drugs.   Family History:  Her family history includes Kidney disease in her son. There is no history of Colon cancer or Breast cancer.   Allergies No Known Allergies   Home Medications  Prior to Admission medications   Medication Sig Start Date End Date Taking? Authorizing Provider  acetaminophen (TYLENOL) 500 MG tablet Take 1,000 mg by mouth every 6 (six) hours as needed for fever or headache (pain).   Yes [provider]  albuterol (PROVENTIL) (2.5 MG/3ML) 0.083% nebulizer solution Take 2.5 mg by nebulization every 6 (six) hours as needed for wheezing or shortness of breath.   Yes [provider]  albuterol (VENTOLIN HFA) 108 (90 Base) MCG/ACT inhaler Inhale 2 puffs into the lungs every 6 (six) hours as needed for wheezing or shortness of breath. 06/30/20  Yes Thurnell Lose, MD  alendronate (FOSAMAX) 70 MG tablet Take 70 mg by mouth every Tuesday. Take with a full glass of water on an empty stomach.   Yes [provider]  atenolol (TENORMIN) 50 MG tablet Take 50 mg by mouth 2 (two) times daily.   Yes  [provider]  calcium carbonate (OSCAL) 1500 (600 Ca) MG TABS tablet Take 600 mg of elemental calcium by mouth 2 (two) times daily with a meal.   Yes [provider]  cetirizine (ZYRTEC) 10 MG tablet Take 10 mg by mouth daily. 12/31/20  Yes [provider]  clopidogrel (PLAVIX) 75 MG tablet Take 1 tablet (75 mg total) by mouth daily. 12/08/20  Yes Bonnielee Haff, MD  diclofenac sodium (VOLTAREN) 1 % GEL Apply 4 g topically 4 (four) times daily as needed for pain. shoulder 09/13/18  Yes [provider]  gabapentin (NEURONTIN) 100 MG capsule Take 100 mg by mouth at bedtime. 07/18/18  Yes [provider]  hydrALAZINE (APRESOLINE) 100 MG tablet Take 0.5 tablets (50 mg total) by mouth 3 (three) times daily. 10/09/20  Yes Nicole Kindred A, DO  melatonin 5 MG TABS Take 10 mg by mouth at bedtime.   Yes [provider]  methimazole (TAPAZOLE) 5 MG tablet Take 0.5 tablets (2.5 mg total) by mouth daily. 02/04/19  Yes Hosie Poisson, MD  Multiple Vitamin (MULTIVITAMIN WITH MINERALS) TABS tablet Take 1 tablet by mouth every morning.   Yes [provider]  Omega-3 Fatty Acids (FISH OIL PO) Take 1 capsule by mouth every morning.   Yes [provider]  OXYGEN Inhale 4 L into the lungs continuous.   Yes [provider]  pantoprazole (PROTONIX) 40 MG tablet Take 1 tablet (40 mg total) by mouth daily at 6 (six) AM. Patient taking differently: Take 40 mg by mouth every morning. 06/30/20  Yes Thurnell Lose, MD  potassium chloride SA (KLOR-CON M) 20 MEQ tablet Take 1 tablet (20 mEq total) by mouth daily. 12/08/20  Yes Bonnielee Haff, MD  simvastatin (ZOCOR) 40 MG tablet Take 1 tablet (40 mg total) by mouth at bedtime. 12/08/20  Yes Bonnielee Haff, MD  torsemide 40 MG TABS Take 40 mg by mouth in the morning and at bedtime. 01/25/21 02/24/21 Yes Baldwin Jamaica, PA-C  vitamin B-12 (CYANOCOBALAMIN) 1000 MCG tablet Take 1,000 mcg by mouth  daily.   Yes [provider]  vitamin C (ASCORBIC ACID) 500 MG tablet Take 500 mg by mouth daily.   Yes [provider]  Vitamin D, Cholecalciferol, 25 MCG (1000 UT) TABS Take 1,000 Units by mouth daily.   Yes [provider]  zinc gluconate 50 MG tablet Take 50 mg by mouth daily.   Yes [provider]  diazepam (VALIUM) 2 MG tablet Take 2 mg by mouth every 12 (twelve) hours as needed for anxiety. Patient not taking: Reported on 02/03/2021    [provider]     Critical care time: 35 minutes     CRITICAL CARE Performed by: Otilio Carpen Shawn Carattini   Total critical care time: 35 minutes  Critical care time was exclusive of separately billable procedures and treating other patients.  Critical care was necessary to treat or prevent imminent or life-threatening deterioration.  Critical care was time spent personally by me on the following activities: development of treatment plan with patient and/or surrogate as well as nursing, discussions with consultants, evaluation of patient's response to treatment, examination of patient, obtaining history from patient or surrogate, ordering and performing treatments and interventions, ordering and review of laboratory studies, ordering and review of radiographic studies, pulse oximetry and re-evaluation of patient's condition.   Otilio Carpen Kendrell Lottman, PA-C [facility] See Amion for pager If no response to pager , please call [phone] until 7pm After 7:00 pm call Elink  [phone]?[place]

## 2021-02-04 NOTE — Progress Notes (Signed)
PCCM interval progress note:   Pt re-evaluated after several hours on Bipap and still appears significantly dyspneic.  Daughter at the bedside states that pt felt better on Bipap, she appears fatigued and will transfer to ICU with critical care as primary for Bipap tonight and close observation. Pt and daughter in agreement with plan   Otilio Carpen Sekou Zuckerman, PA-C [facility] See Amion for pager If no response to pager , please call [phone] until 7pm After 7:00 pm call Elink  [phone]?[place]

## 2021-02-05 ENCOUNTER — Inpatient Hospital Stay: Payer: Self-pay

## 2021-02-05 ENCOUNTER — Inpatient Hospital Stay (HOSPITAL_COMMUNITY): Payer: Medicare HMO

## 2021-02-05 DIAGNOSIS — I5033 Acute on chronic diastolic (congestive) heart failure: Secondary | ICD-10-CM | POA: Diagnosis not present

## 2021-02-05 DIAGNOSIS — N179 Acute kidney failure, unspecified: Secondary | ICD-10-CM | POA: Diagnosis not present

## 2021-02-05 DIAGNOSIS — J9622 Acute and chronic respiratory failure with hypercapnia: Secondary | ICD-10-CM | POA: Diagnosis not present

## 2021-02-05 DIAGNOSIS — I272 Pulmonary hypertension, unspecified: Secondary | ICD-10-CM

## 2021-02-05 DIAGNOSIS — J9621 Acute and chronic respiratory failure with hypoxia: Secondary | ICD-10-CM | POA: Diagnosis not present

## 2021-02-05 LAB — CBC
HCT: 29.3 % — ABNORMAL LOW (ref 36.0–46.0)
Hemoglobin: 8.9 g/dL — ABNORMAL LOW (ref 12.0–15.0)
MCH: 25.6 pg — ABNORMAL LOW (ref 26.0–34.0)
MCHC: 30.4 g/dL (ref 30.0–36.0)
MCV: 84.2 fL (ref 80.0–100.0)
Platelets: 242 10*3/uL (ref 150–400)
RBC: 3.48 MIL/uL — ABNORMAL LOW (ref 3.87–5.11)
RDW: 16.3 % — ABNORMAL HIGH (ref 11.5–15.5)
WBC: 6.6 10*3/uL (ref 4.0–10.5)
nRBC: 0 % (ref 0.0–0.2)

## 2021-02-05 LAB — MAGNESIUM: Magnesium: 2.7 mg/dL — ABNORMAL HIGH (ref 1.7–2.4)

## 2021-02-05 LAB — BLOOD GAS, ARTERIAL
Acid-Base Excess: 18 mmol/L — ABNORMAL HIGH (ref 0.0–2.0)
Bicarbonate: 45 mmol/L — ABNORMAL HIGH (ref 20.0–28.0)
Delivery systems: POSITIVE
Expiratory PAP: 8
FIO2: 50
Inspiratory PAP: 20
Mode: POSITIVE
O2 Saturation: 93.6 %
Patient temperature: 98.6
pCO2 arterial: 73.8 mmHg (ref 32.0–48.0)
pH, Arterial: 7.402 (ref 7.350–7.450)
pO2, Arterial: 72.7 mmHg — ABNORMAL LOW (ref 83.0–108.0)

## 2021-02-05 LAB — ECHOCARDIOGRAM COMPLETE
AR max vel: 2.31 cm2
AV Peak grad: 10.5 mmHg
Ao pk vel: 1.62 m/s
Area-P 1/2: 3.6 cm2
Height: 59 in
S' Lateral: 2.3 cm
Weight: 2603.19 oz

## 2021-02-05 LAB — BASIC METABOLIC PANEL
Anion gap: 8 (ref 5–15)
BUN: 40 mg/dL — ABNORMAL HIGH (ref 8–23)
CO2: 43 mmol/L — ABNORMAL HIGH (ref 22–32)
Calcium: 8.9 mg/dL (ref 8.9–10.3)
Chloride: 87 mmol/L — ABNORMAL LOW (ref 98–111)
Creatinine, Ser: 1.48 mg/dL — ABNORMAL HIGH (ref 0.44–1.00)
GFR, Estimated: 36 mL/min — ABNORMAL LOW (ref >60)
Glucose, Bld: 133 mg/dL — ABNORMAL HIGH (ref 70–99)
Potassium: 3.7 mmol/L (ref 3.5–5.1)
Sodium: 138 mmol/L (ref 135–145)

## 2021-02-05 LAB — COOXEMETRY PANEL
Carboxyhemoglobin: 1.6 % — ABNORMAL HIGH (ref 0.5–1.5)
Methemoglobin: 1.3 % (ref 0.0–1.5)
O2 Saturation: 62.7 %
Total hemoglobin: 8.1 g/dL — ABNORMAL LOW (ref 12.0–16.0)

## 2021-02-05 LAB — LIPID PANEL
Cholesterol: 165 mg/dL (ref 0–200)
HDL: 59 mg/dL (ref >40)
LDL Cholesterol: 95 mg/dL (ref 0–99)
Total CHOL/HDL Ratio: 2.8 RATIO
Triglycerides: 57 mg/dL (ref <150)
VLDL: 11 mg/dL (ref 0–40)

## 2021-02-05 LAB — BRAIN NATRIURETIC PEPTIDE: B Natriuretic Peptide: 1051.4 pg/mL — ABNORMAL HIGH (ref 0.0–100.0)

## 2021-02-05 LAB — PHOSPHORUS: Phosphorus: 4.1 mg/dL (ref 2.5–4.6)

## 2021-02-05 LAB — TSH: TSH: 1.753 u[IU]/mL (ref 0.350–4.500)

## 2021-02-05 LAB — TROPONIN I (HIGH SENSITIVITY)
Troponin I (High Sensitivity): 66 ng/L — ABNORMAL HIGH (ref <18)
Troponin I (High Sensitivity): 65 ng/L — ABNORMAL HIGH (ref <18)

## 2021-02-05 MED ORDER — METOLAZONE 5 MG PO TABS
5.0000 mg | ORAL_TABLET | Freq: Once | ORAL | Status: AC
  Administered 2021-02-05: 5 mg via ORAL
  Filled 2021-02-05: qty 1

## 2021-02-05 MED ORDER — MILRINONE LACTATE IN DEXTROSE 20-5 MG/100ML-% IV SOLN
0.1250 ug/kg/min | INTRAVENOUS | Status: DC
  Administered 2021-02-05 – 2021-02-07 (×3): 0.25 ug/kg/min via INTRAVENOUS
  Administered 2021-02-08: 0.125 ug/kg/min via INTRAVENOUS
  Filled 2021-02-05 (×4): qty 100

## 2021-02-05 MED ORDER — ATORVASTATIN CALCIUM 40 MG PO TABS
80.0000 mg | ORAL_TABLET | Freq: Every evening | ORAL | Status: DC
  Administered 2021-02-05 – 2021-02-22 (×18): 80 mg via ORAL
  Filled 2021-02-05 (×18): qty 2

## 2021-02-05 MED ORDER — FUROSEMIDE 10 MG/ML IJ SOLN
4.0000 mg/h | INTRAVENOUS | Status: DC
  Administered 2021-02-05 – 2021-02-06 (×3): 20 mg/h via INTRAVENOUS
  Administered 2021-02-07 – 2021-02-09 (×2): 4 mg/h via INTRAVENOUS
  Filled 2021-02-05 (×6): qty 20

## 2021-02-05 MED ORDER — HYDRALAZINE HCL 20 MG/ML IJ SOLN
10.0000 mg | INTRAMUSCULAR | Status: DC | PRN
  Administered 2021-02-05: 20 mg via INTRAVENOUS
  Administered 2021-02-05 – 2021-02-07 (×2): 10 mg via INTRAVENOUS
  Filled 2021-02-05 (×3): qty 1

## 2021-02-05 MED ORDER — FENTANYL CITRATE (PF) 100 MCG/2ML IJ SOLN
25.0000 ug | Freq: Once | INTRAMUSCULAR | Status: AC
  Administered 2021-02-05: 25 ug via INTRAVENOUS

## 2021-02-05 MED ORDER — HYDRALAZINE HCL 50 MG PO TABS
75.0000 mg | ORAL_TABLET | Freq: Three times a day (TID) | ORAL | Status: DC
  Administered 2021-02-05 – 2021-02-09 (×9): 75 mg via ORAL
  Filled 2021-02-05 (×11): qty 1

## 2021-02-05 MED ORDER — SODIUM CHLORIDE 0.9% FLUSH
10.0000 mL | Freq: Two times a day (BID) | INTRAVENOUS | Status: DC
  Administered 2021-02-05: 20 mL
  Administered 2021-02-05: 10 mL
  Administered 2021-02-06: 20 mL
  Administered 2021-02-06 – 2021-02-10 (×7): 10 mL
  Administered 2021-02-10 – 2021-02-11 (×2): 20 mL
  Administered 2021-02-11: 10 mL
  Administered 2021-02-12: 20 mL

## 2021-02-05 MED ORDER — FENTANYL CITRATE (PF) 100 MCG/2ML IJ SOLN
INTRAMUSCULAR | Status: AC
  Filled 2021-02-05: qty 2

## 2021-02-05 MED ORDER — SODIUM CHLORIDE 0.9% FLUSH: 10.0000 mL | INTRAVENOUS | Status: DC | PRN

## 2021-02-05 NOTE — Progress Notes (Addendum)
NAME:  Mandy [name], MRN:  [id number], DOB:  03/12/1940, LOS: 2 ADMISSION DATE:  02/02/2021, CONSULTATION DATE:  02/05/21 REFERRING MD:  Dwyane Dee, CHIEF COMPLAINT:  hypercarbia   History of Present Illness:  Mandy [name] is a 81 y.o. F with PMH significant for severe pulmonary HTN, HTN, HL, DM, recent pleural effusions, goiter and thyroid disease and HFpEF who presented to the ED 2/1 after desaturating during  a sleep study to 30% for about 10 minutes.  She had increased pleural effusions on CXR and was requiring 4L O2, so admitted to telemetry and treated with IV Lasix.   On 2/2 she was increasingly dyspneic and ABG showed pH 7.3 with pCO2 of 77 and PCCM was consulted.    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   Significant Hospital Events: Including procedures, antibiotic start and stop dates in addition to other pertinent events   2/1 admit to hospitalists 2/2 PCCM consult for hypercarbia  Interim History / Subjective:  Pt. Initially appears comfortable at rest. No indication of increased WOB . She was been weaned from 6L to 2L [place] by RT at rest, sats were 94%. When asked if her breathing is better, she nodded her head yes, made eye contact  she was interactive and appropriate.    Within 5 minutes nursing called [place] into the room ,pt had dropped her sats  into the 60's and appeared to have fainted. Nursing had placed her back on her BiPAP. We increased FiO2 to 100%,  she immediately recouped her sats to 94%. Shortly thereafter she woke up, and is following simple commands. STAT CXR ordered . ? Flash pulmonary edema with her PAH. ABG will be drawn in 30 minutes.   She is net negative 580 cc's . 450 cc urine output.  Pt speaks some English, Andria Meuse is her primary language, however per RN [place] interpreter was unable to translate her dialect.  Daughter has been able to help with translation  Labs >>  Na 138/ K 3.7? Cl 87/ CO2 43/ BUN 40/ Creatinine 1.48/ Mag 2.7/ WBC 6.6/ HGB 8.9/ Platelets 242/ TSH 1.753  Objective   Blood pressure (!) 158/40, pulse 68, temperature 98.6 F (37 C), temperature source Oral, resp. rate 18, height 4\' 11"  (1.499 m), weight 73.8 kg, SpO2 92 %.        Intake/Output Summary (Last 24 hours) at 02/05/2021 0823 Last data filed at 02/05/2021 0000 Gross per 24 hour  Intake 600 ml  Output 250 ml  Net 350 ml   Filed Weights   02/04/21 0611 02/04/21 2137 02/05/21 0500  Weight: 74.2 kg 73.8 kg 73.8 kg    General:  well-nourished elderly F awake and sitting up in bed, currently on BiPAP but awake. HEENT: MM pink/moist, sclera anicteric,short,  thick neck. Neuro: awake, nods to questions and following commands after her syncopal event CV: s1s2 rrr, no m/r/g PULM:  on BiPAP at 100%, non-labored at rest,  able to answer questions, no accessory muscle use, bilateral crackles. GI: soft, ND, NT,  bsx4 active , Body mass index is 32.86 kg/m. 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  - Trend BMET daily - CXR now and in am - BNP now - Repeat Echo - Will add Metolazone   - Troponin now - Cardiology following, myeloma labs pending to evaluate for amyloid process - Consider addition of PAH maintenance  - outpatient PFT's and sleep study when able, follows with [facility] - Palliation consult  Will keep patient on Critical Care Service for now, will continue to diurese, and wean oxygen slowly. Check Echo, Cardiology are considering [facility] for [facility] Failure consult.  Palliative Care consulted for initiation of Goals of care conversation       Best Practice (right click and "Reselect all SmartList Selections" daily)    Labs   CBC: Recent Labs  Lab 02/03/21 0007 02/03/21 0532 02/04/21 0703 02/05/21 0243  WBC 8.1 6.6 6.5 6.6  HGB 9.4* 8.7* 8.4* 8.9*  HCT 30.5* 28.5* 28.1* 29.3*  MCV 84.0 84.6 85.9 84.2  PLT 266 237 222 607    Basic Metabolic Panel: Recent Labs  Lab 02/03/21 0532 02/03/21 2019 02/04/21 0703 02/04/21 1553 02/05/21 0243  NA 136 138 137 138 138  K 4.0 4.1 4.0 3.8 3.7  CL 86* 88* 86* 88* 87*  CO2 44* 42* 43* 43* 43*  GLUCOSE 133* 139* 112* 107* 133*  BUN 45* 44* 45* 44* 40*  CREATININE 1.56* 1.44* 1.50* 1.55* 1.48*  CALCIUM 9.1 8.8* 8.8* 8.9 8.9  MG 2.7*  --  2.6*  --  2.7*  PHOS  --   --   --   --  4.1   GFR: Estimated Creatinine Clearance: 27 mL/min (A) (by C-G formula based on SCr of 1.48 mg/dL (H)). Recent Labs  Lab 02/03/21 0007 02/03/21 0532 02/04/21 0703 02/05/21 0243  WBC 8.1 6.6 6.5 6.6    Liver Function Tests: No results for input(s): AST, ALT, ALKPHOS, BILITOT, PROT, ALBUMIN in the last 168 hours. No results for input(s): LIPASE, AMYLASE in the last 168 hours. No results for input(s): AMMONIA in the last 168 hours.  ABG    Component Value Date/Time   PHART 7.361 02/04/2021  1232   PCO2ART 77.0 (HH) 02/04/2021 1232   PO2ART 74.8 (L) 02/04/2021 1232   HCO3 42.6 (H) 02/04/2021 1232   TCO2 47 (H) 11/30/2020 1632   O2SAT 93.9 02/04/2021 1232     Coagulation Profile: No results for input(s): INR, PROTIME in the last 168 hours.  Cardiac Enzymes: No results for input(s): CKTOTAL, CKMB, CKMBINDEX, TROPONINI in the last 168 hours.  HbA1C: Hgb A1c MFr Bld  Date/Time Value Ref Range Status  12/07/2020 01:44 AM 5.7 (H) 4.8 - 5.6 % Final    Comment:    (NOTE) Pre diabetes:          5.7%-6.4%  Diabetes:              >6.4%  Glycemic control for   <7.0% adults with diabetes   11/03/2020 06:43  PM 5.9 (H) 4.8 - 5.6 % Final    Comment:    (NOTE) Pre diabetes:          5.7%-6.4%  Diabetes:              >6.4%  Glycemic control for   <7.0% adults with diabetes     Allergies No Known Allergies   Home Medications  Prior to Admission medications   Medication Sig Start Date End Date Taking? Authorizing Provider  acetaminophen (TYLENOL) 500 MG tablet Take 1,000 mg by mouth every 6 (six) hours as needed for fever or headache (pain).   Yes [provider]  albuterol (PROVENTIL) (2.5 MG/3ML) 0.083% nebulizer solution Take 2.5 mg by nebulization every 6 (six) hours as needed for wheezing or shortness of breath.   Yes [provider]  albuterol (VENTOLIN HFA) 108 (90 Base) MCG/ACT inhaler Inhale 2 puffs into the lungs every 6 (six) hours as needed for wheezing or shortness of breath. 06/30/20  Yes Thurnell Lose, MD  alendronate (FOSAMAX) 70 MG tablet Take 70 mg by mouth every Tuesday. Take with a full glass of water on an empty stomach.   Yes [provider]  atenolol (TENORMIN) 50 MG tablet Take 50 mg by mouth 2 (two) times daily.   Yes [provider]  calcium carbonate (OSCAL) 1500 (600 Ca) MG TABS tablet Take 600 mg of elemental calcium by mouth 2 (two) times daily with a meal.   Yes [provider]  cetirizine  (ZYRTEC) 10 MG tablet Take 10 mg by mouth daily. 12/31/20  Yes [provider]  clopidogrel (PLAVIX) 75 MG tablet Take 1 tablet (75 mg total) by mouth daily. 12/08/20  Yes Bonnielee Haff, MD  diclofenac sodium (VOLTAREN) 1 % GEL Apply 4 g topically 4 (four) times daily as needed for pain. shoulder 09/13/18  Yes [provider]  gabapentin (NEURONTIN) 100 MG capsule Take 100 mg by mouth at bedtime. 07/18/18  Yes [provider]  hydrALAZINE (APRESOLINE) 100 MG tablet Take 0.5 tablets (50 mg total) by mouth 3 (three) times daily. 10/09/20  Yes Nicole Kindred A, DO  melatonin 5 MG TABS Take 10 mg by mouth at bedtime.   Yes [provider]  methimazole (TAPAZOLE) 5 MG tablet Take 0.5 tablets (2.5 mg total) by mouth daily. 02/04/19  Yes Hosie Poisson, MD  Multiple Vitamin (MULTIVITAMIN WITH MINERALS) TABS tablet Take 1 tablet by mouth every morning.   Yes [provider]  Omega-3 Fatty Acids (FISH OIL PO) Take 1 capsule by mouth every morning.   Yes [provider]  OXYGEN Inhale 4 L into the lungs continuous.   Yes [provider]  pantoprazole (PROTONIX) 40 MG tablet Take 1 tablet (40 mg total) by mouth daily at 6 (six) AM. Patient taking differently: Take 40 mg by mouth every morning. 06/30/20  Yes Thurnell Lose, MD  potassium chloride SA (KLOR-CON M) 20 MEQ tablet Take 1 tablet (20 mEq total) by mouth daily. 12/08/20  Yes Bonnielee Haff, MD  simvastatin (ZOCOR) 40 MG tablet Take 1 tablet (40 mg total) by mouth at bedtime. 12/08/20  Yes Bonnielee Haff, MD  torsemide 40 MG TABS Take 40 mg by mouth in the morning and at bedtime. 01/25/21 02/24/21 Yes Baldwin Jamaica, PA-C  vitamin B-12 (CYANOCOBALAMIN) 1000 MCG tablet Take 1,000 mcg by mouth daily.   Yes [provider]  vitamin C (ASCORBIC ACID) 500 MG tablet Take 500 mg by mouth daily.  Yes [provider]  Vitamin D, Cholecalciferol, 25 MCG (1000 UT) TABS Take 1,000  Units by mouth daily.   Yes [provider]  zinc gluconate 50 MG tablet Take 50 mg by mouth daily.   Yes [provider]  diazepam (VALIUM) 2 MG tablet Take 2 mg by mouth every 12 (twelve) hours as needed for anxiety. Patient not taking: Reported on 02/03/2021    [provider]     Critical care time: 40 minutes     CRITICAL CARE Performed by: Magdalen Spatz   Total critical care time: 40 minutes  Critical care time was exclusive of separately billable procedures and treating other patients.  Critical care was necessary to treat or prevent imminent or life-threatening deterioration.  Critical care was time spent personally by me on the following activities: development of treatment plan with patient and/or surrogate as well as nursing, discussions with consultants, evaluation of patient's response to treatment, examination of patient, obtaining history from patient or surrogate, ordering and performing treatments and interventions, ordering and review of laboratory studies, ordering and review of radiographic studies, pulse oximetry and re-evaluation of patient's condition.   Magdalen Spatz, MSN, AGACNP-BC [place] for pager If no response to pager , please call [phone] until 7pm After 7:00 pm call Elink  [phone]?[place]

## 2021-02-05 NOTE — Progress Notes (Signed)
PCCM Progress Note  Patient reporting dull chest pain that began 10-15 min ago. No clear localization of radiation. Not improved on maalox. EKG with TWI in III and aVF and V2 and V3 which is non-specific. Prior LHC in 11/2020 with no significant CAD and troponin earlier from today in 89s. Discussed care with Cardiology. Vitals reviewed. Patient hemodynamically stable  Assessment Chest pain may be from demand ischemia - Trend trop - EKG PRN

## 2021-02-05 NOTE — Progress Notes (Signed)
Echocardiogram 2D Echocardiogram has been performed.  Mandy [name] 02/05/2021, 11:24 AM

## 2021-02-05 NOTE — Care Plan (Signed)
This patient is now under ICU care.  Patient may need to be transferred to [facility] for AHF consultation.  We will sign off.  We will resume care once patient medically stable for stepdown.

## 2021-02-05 NOTE — Progress Notes (Signed)
Peripherally Inserted Central Catheter Placement  The IV Nurse has discussed with the patient and/or persons authorized to consent for the patient, the purpose of this procedure and the potential benefits and risks involved with this procedure.  The benefits include less needle sticks, lab draws from the catheter, and the patient may be discharged home with the catheter. Risks include, but not limited to, infection, bleeding, blood clot (thrombus formation), and puncture of an artery; nerve damage and irregular heartbeat and possibility to perform a PICC exchange if needed/ordered by physician.  Alternatives to this procedure were also discussed.  Bard Power PICC patient education guide, fact sheet on infection prevention and patient information card has been provided to patient /or left at bedside.    PICC Placement Documentation  PICC Double Lumen 02/05/21 PICC Right Brachial 36 cm 0 cm (Active)  Indication for Insertion or Continuance of Line Vasoactive infusions 02/05/21 1342  Exposed Catheter (cm) 0 cm 02/05/21 1342  Site Assessment Dry;Intact;Clean 02/05/21 1342  Lumen #1 Status Flushed;Blood return noted;Saline locked 02/05/21 1342  Lumen #2 Status Flushed;Blood return noted;Saline locked 02/05/21 1342  Dressing Type Transparent 02/05/21 1342  Dressing Status Intact;Dry;Clean 02/05/21 1342  Antimicrobial disc in place? Yes 02/05/21 1342  Dressing Change Due 02/12/21 02/05/21 1342       Scotty Court 02/05/2021, 1:47 PM

## 2021-02-05 NOTE — Progress Notes (Signed)
EKG reviewed with Dr. Gardiner Rhyme. NSR 57bpm, diffuse TWI, QTc 448ms. May be related to hypoxia this AM. Continue plan as outlined.

## 2021-02-05 NOTE — Progress Notes (Addendum)
Progress Note  Patient Name: Mandy [name] Date of Encounter: 02/05/2021  Primary Cardiologist: Donato Heinz, MD  Subjective   Worsening dyspnea yesterday with hypercarbia -> tx to ICU on BIPAP. This AM was able to wean back down to 2 L. However, developed episode of unresponsiveness/syncope associated with hypoxia into the 60s. Patient did not lose pulse. No arrhythmia on telemetry. Placed back on BiPAP and regained consciousness/O2 sat. Remains on Bipap at this time. Denies c/o. Nods head yes to SOB.  Inpatient Medications    Scheduled Meds:  atenolol  50 mg Oral BID   chlorhexidine  15 mL Mouth Rinse BID   Chlorhexidine Gluconate Cloth  6 each Topical Daily   clopidogrel  75 mg Oral Daily   furosemide  80 mg Intravenous BID   heparin  5,000 Units Subcutaneous Q8H   hydrALAZINE  75 mg Oral TID   mouth rinse  15 mL Mouth Rinse BID   melatonin  10 mg Oral QHS   methimazole  2.5 mg Oral Daily   pantoprazole  40 mg Oral q morning   simvastatin  40 mg Oral QHS   sodium chloride flush  3 mL Intravenous Q12H   Continuous Infusions:  PRN Meds: acetaminophen **OR** acetaminophen, albuterol, alum & mag hydroxide-simeth, hydrALAZINE   Vital Signs    Vitals:   02/05/21 0500 02/05/21 0600 02/05/21 0740 02/05/21 0742  BP: (!) 116/36 (!) 158/40    Pulse:      Resp: (!) 24 18    Temp:      TempSrc:      SpO2:   (!) 66% 92%  Weight: 73.8 kg     Height:        Intake/Output Summary (Last 24 hours) at 02/05/2021 0838 Last data filed at 02/05/2021 0000 Gross per 24 hour  Intake 600 ml  Output 250 ml  Net 350 ml   Last 3 Weights 02/05/2021 02/04/2021 02/04/2021  Weight (lbs) 162 lb 11.2 oz 162 lb 11.2 oz 163 lb 9.3 oz  Weight (kg) 73.8 kg 73.8 kg 74.2 kg     Telemetry    NSr - Personally Reviewed  ECG    Pending - Personally Reviewed  Physical Exam   GEN: No acute distress. Resting comfortably on BiPAP. HEENT: Normocephalic, atraumatic, sclera  non-icteric. Neck: No JVD or bruits. Cardiac: RRR no murmurs, rubs, or gallops.  Respiratory: BiPAP, nonlabored, no overt wheezing. Bibasilar rales. No rhonchi.  GI: Soft, nontender, non-distended, BS +x 4. MS: no deformity. Extremities: No clubbing or cyanosis. Trace B/L LE edema. Distal pedal pulses are 2+ and equal bilaterally. Neuro:  AAOx3. Follows commands. Psych:  Responds to questions appropriately with a normal affect.  Labs    High Sensitivity Troponin:   Recent Labs  Lab 02/03/21 0007 02/03/21 0316  TROPONINIHS 72* 59*      Cardiac EnzymesNo results for input(s): TROPONINI in the last 168 hours. No results for input(s): TROPIPOC in the last 168 hours.   Chemistry Recent Labs  Lab 02/04/21 0703 02/04/21 1553 02/05/21 0243  NA 137 138 138  K 4.0 3.8 3.7  CL 86* 88* 87*  CO2 43* 43* 43*  GLUCOSE 112* 107* 133*  BUN 45* 44* 40*  CREATININE 1.50* 1.55* 1.48*  CALCIUM 8.8* 8.9 8.9  GFRNONAA 35* 34* 36*  ANIONGAP 8 7 8      Hematology Recent Labs  Lab 02/03/21 0532 02/04/21 0703 02/05/21 0243  WBC 6.6 6.5 6.6  RBC 3.37* 3.27* 3.48*  HGB 8.7* 8.4* 8.9*  HCT 28.5* 28.1* 29.3*  MCV 84.6 85.9 84.2  MCH 25.8* 25.7* 25.6*  MCHC 30.5 29.9* 30.4  RDW 16.1* 16.0* 16.3*  PLT 237 222 242    BNP Recent Labs  Lab 02/03/21 0007  BNP 670.8*     DDimer No results for input(s): DDIMER in the last 168 hours.   Radiology    SLEEP STUDY DOCUMENTS  Result Date: 02/03/2021 Ordered by an unspecified provider.   Cardiac Studies   2d echo 12/15/20   1. Left ventricular ejection fraction, by estimation, is 60 to 65%. The  left ventricle has normal function. The left ventricle has no regional  wall motion abnormalities. There is moderate concentric left ventricular  hypertrophy. Left ventricular  diastolic parameters are consistent with Grade II diastolic dysfunction  (pseudonormalization). Elevated left atrial pressure. There is the  interventricular septum  is flattened in systole and diastole, consistent  with right ventricular pressure and volume   overload.   2. Right ventricular systolic function is mildly reduced. The right  ventricular size is normal. There is severely elevated pulmonary artery  systolic pressure.   3. Left atrial size was moderately dilated.   4. Right atrial size was mildly dilated.   5. The mitral valve is normal in structure. Trivial mitral valve  regurgitation. No evidence of mitral stenosis.   6. Tricuspid valve regurgitation is moderate.   7. The aortic valve is tricuspid. Aortic valve regurgitation is not  visualized. No aortic stenosis is present.   8. The inferior vena cava is dilated in size with <50% respiratory  variability, suggesting right atrial pressure of 15 mmHg.   Comparison(s): No significant change from prior study. Prior images  reviewed side by side.   Patient Profile     81 y.o. 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 Has hx of prior Covid PNA with hypoxemia in 02/2019 as well as 06/2020. 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 Between 09/2020 and now has had multiple readmissions for recurrent a/c respiratory failure and pleural effusions. Admitted with same 02/03/21, transported from sleep center with hypoxia.  Patient speaks dialect of [place] not available by interpreter services. Daughter has been able to translate at times. Pt also speaks broken [place].  Assessment & Plan    1. Recurrent acute on chronic hypoxic/hypercarbic respiratory failure and recurrent right pleural effusion with acute on chronic diastolic CHF and severe pulmonary HTN - also with  hypercarbia requiring BiPAP, also with syncope/hypoxia 02/04/21 - weight only down 2lb at this time, remains with suboptimal UOP - had syncope this AM with O2 sats in the 60s - very concerning, very complex clinical situation, requiring pulmonary consultation and ICU level care. We will plan echo this AM  - may need to consider inotropes if not diuresing and RV function is significantly reduced. Diuresis increased to IV lasix 80 mg BID + metolazone 5 mg daily - myeloma labs pending for consideration of amyloid process, not stable enough to lie flat for cMRI at this time - outpatient PFTs planned - needs repeat sleep study when more euvolemic as she had hypoxia in the setting of recumbency for study - consider VQ when more adequately diuresed - updated EKG  also ordered and pending this AM   2. Essential HTN - BP remains variable from 110s to 160s, manage in context above   3. AKI on CKD stage 3a with hyperkalemia - K remains normal now, Cr remains similar to admit values  - continue to follow   4. 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 Nonobstructive CAD by cath 11/2020 - minimally elevated troponin likely demand process   6. Thyroid disease - TSH wnl   7. Chronic appearing anemia, recent range 7-9 - similar this admission    For questions or updates, please contact [place] Please consult [email or web address] for contact info under Cardiology/STEMI.  Signed, Charlie Pitter, PA-C 02/05/2021, 8:38 AM    Patient seen and examined.  Agree with above documentation.  On exam, patient is alert, regular rate and rhythm, no  murmurs, diminished breath sounds, no LE edema, +JVD.  Transferred to ICU yesterday for increased work of breathing and hypercarbia.  Initially on BiPAP.  This morning was weaned off BiPAP and developed hypoxia and syncopal episode.  Placed back on BiPAP with improvement.  Chest x-ray shows unchanged moderate pulmonary edema with moderate right and small left pleural effusions.  Only recorded as 450 cc urine output yesterday but multiple unmeasured urine output.  Weight is unchanged from yesterday, down 2 pounds from admission.  Will increase diuresis to IV Lasix 80 mg twice daily and also give dose of metolazone 5 mg.  If not diuresing and RV function significantly reduced on echo, would consider CVC placement for measuring Coox, may need inotropic support.  Addendum: Echo shows normal LV EF, moderate RV dysfunction, severe pulmonary hypertension (RVSP 100 mmHg).  She has had minimal UOP with IV lasix80 mg + metolazone.  BP 120/70. Discussed with Dr Haroldine Laws in [place] Failure and Dr Loanne Drilling in [facility].  Will start lasix gtt at 20 mg/hr and start on milrinone 0.25 for her right heart failure.  PICC line being placed.  CRITICAL CARE TIME: I have spent a total of 33 minutes with patient reviewing hospital notes, telemetry, EKGs, labs and examining the patient as well as establishing an assessment and plan that was discussed with the patient.  > 50% of time was spent in direct patient care. The patient is critically ill with multi-organ system failure and requires high complexity decision making for assessment and support, frequent evaluation and titration of therapies, application of advanced monitoring technologies and extensive interpretation of multiple databases.   Donato Heinz, MD

## 2021-02-05 NOTE — Progress Notes (Addendum)
Echo shows normal LV EF, moderate RV dysfunction, severe pulmonary hypertension (RVSP 100 mmHg).  She has had minimal UOP with IV lasix 80 mg + metolazone 5 mg.  BP 120/70. Discussed with Dr Haroldine Laws in [place] Failure and Dr Loanne Drilling in [facility].  Will start lasix gtt at 20 mg/hr and start on milrinone 0.25 to support her right heart failure.  PICC line being placed.  Addendum: Significant improvement since starting milrinone and lasix gtt.  Has had 700cc UOP since starting milrinone. She is off Bipap, breathing comfortably on 3L.  Much more alert.  Will continue milrinone and lasix gtt.  Donato Heinz, MD

## 2021-02-05 NOTE — TOC Initial Note (Signed)
Transition of Care [facility]) - Initial/Assessment Note    Patient Details  Name: Mandy [name] MRN: [id number] Date of Birth: 08/30/1940  [facility] CM/SW Contact:    Dessa Phi, RN Phone Number: 02/05/2021, 9:59 AM  Clinical Narrative: Noted patient speaks [place];has dtr [name] who spekas English. Continue to monitor.                  Expected Discharge Plan: Home/Self Care Barriers to Discharge: Continued Medical Work up   Patient Goals and CMS Choice Patient states their goals for this hospitalization and ongoing recovery are:: go home CMS Medicare.gov Compare Post Acute Care list provided to:: Patient Represenative (must comment) [name] dtr [phone]) Choice offered to / list presented to : Adult Children  Expected Discharge Plan and Services Expected Discharge Plan: Home/Self Care   Discharge Planning Services: CM Consult Post Acute Care Choice: NA Living arrangements for the past 2 months: Single Family Home                                      Prior Living Arrangements/Services Living arrangements for the past 2 months: Single Family Home Lives with:: Adult Children Patient language and need for interpreter reviewed:: Yes Do you feel safe going back to the place where you live?: Yes      Need for Family Participation in Patient Care: Yes (Comment) Care giver support system in place?: Yes (comment)   Criminal Activity/Legal Involvement Pertinent to Current Situation/Hospitalization: No - Comment as needed  Activities of Daily Living Home Assistive Devices/Equipment: Oxygen, Walker (specify type) ADL Screening (condition at time of admission) Patient's cognitive ability adequate to safely complete daily activities?: Yes Is the patient deaf or have difficulty hearing?: No Does the patient have difficulty seeing, even when wearing glasses/contacts?: No Does the patient have difficulty concentrating, remembering, or making decisions?:  No Patient able to express need for assistance with ADLs?: Yes Does the patient have difficulty dressing or bathing?: No Independently performs ADLs?: No Communication: Independent (dialect requires interpretation via daughter for complex issues) Feeding: Independent Walks in Home: Independent with device (comment) Does the patient have difficulty walking or climbing stairs?: Yes Weakness of Legs: Both Weakness of Arms/Hands: None  Permission Sought/Granted Permission sought to share information with : Case Manager Permission granted to share information with : Yes, Verbal Permission Granted  Share Information with NAME: Case Manager           Emotional Assessment Appearance:: Appears stated age            Admission diagnosis:  Acute on chronic diastolic CHF (congestive heart failure) ([place]) [I50.33] Acute on chronic congestive heart failure, unspecified heart failure type [facility]) [I50.9] Patient Active Problem List   Diagnosis Date Noted   Acute on chronic diastolic CHF (congestive heart failure) ([place]) 02/03/2021   Pressure injury of skin 12/16/2020   CHF exacerbation ([place]) 12/14/2020   Cerebral thrombosis with cerebral infarction 12/07/2020   CVA (cerebral vascular accident) ([place]) 12/06/2020   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[place]) 12/03/2020   Chest pain of uncertain etiology    Severe pulmonary hypertension ([place])    Chronic respiratory failure with hypoxia, on home oxygen therapy ([place]) 11/24/2020   Anxiety 11/24/2020   Acute on chronic respiratory failure with hypoxia and hypercapnia ([place]) 11/23/2020   Acute on chronic heart failure with preserved ejection fraction (HFpEF) ([place]) 11/23/2020   Hypoxia 11/03/2020  Pleural effusion on right 10/01/2020   Hyperkalemia 10/01/2020   Acute renal failure superimposed on stage 3a chronic kidney disease ([place]) 10/01/2020   Anemia 10/01/2020   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   Acute respiratory failure with hypoxia ([place]) 06/26/2020    Hypertensive urgency 06/26/2020   Generalized weakness 06/26/2020   Dizziness 06/26/2020   Obesity (BMI 30-39.9) 06/26/2020   Hyperthyroidism 06/26/2020   Chronic diastolic CHF (congestive heart failure) ([place]) 06/26/2020   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 [place] [address] Phone: [phone] Fax: [facility] [address] [place] [address] Phone: [phone] Fax: [phone]     Social Determinants of Health (SDOH) Interventions    Readmission Risk Interventions Readmission Risk Prevention Plan 02/05/2021 11/06/2020  Transportation Screening Complete Complete  PCP or Specialist Appt within 3-5 Days - Complete  HRI or [place] - Complete  Social Work Consult for [place] Planning/Counseling - Complete  Palliative Care Screening - Complete  Medication Review Press photographer) Complete Complete  PCP or Specialist appointment within 3-5 days of discharge Complete -  [place] or Home Care Consult Complete -  SW Recovery Care/Counseling Consult Complete -  Palliative Care Screening Complete -  [place] Not Applicable -  Some recent data might be hidden

## 2021-02-05 NOTE — Progress Notes (Signed)
[facility] [facility] Progress Note Patient Name: Mandy [name] DOB: 04/10/1940 MRN: [id number]   Date of Service  02/05/2021  HPI/Events of Note  Patient with sub-optimal BP control.  eICU Interventions  PRN iv Hydralazine ordered.        Mandy [name] 02/05/2021, 2:20 AM

## 2021-02-06 ENCOUNTER — Inpatient Hospital Stay (HOSPITAL_COMMUNITY): Payer: Medicare HMO

## 2021-02-06 DIAGNOSIS — I5033 Acute on chronic diastolic (congestive) heart failure: Secondary | ICD-10-CM | POA: Diagnosis not present

## 2021-02-06 DIAGNOSIS — J9622 Acute and chronic respiratory failure with hypercapnia: Secondary | ICD-10-CM | POA: Diagnosis not present

## 2021-02-06 DIAGNOSIS — I272 Pulmonary hypertension, unspecified: Secondary | ICD-10-CM | POA: Diagnosis not present

## 2021-02-06 DIAGNOSIS — J9621 Acute and chronic respiratory failure with hypoxia: Secondary | ICD-10-CM | POA: Diagnosis not present

## 2021-02-06 LAB — CBC WITH DIFFERENTIAL/PLATELET
Abs Immature Granulocytes: 0.01 10*3/uL (ref 0.00–0.07)
Basophils Absolute: 0 10*3/uL (ref 0.0–0.1)
Basophils Relative: 0 %
Eosinophils Absolute: 0.1 10*3/uL (ref 0.0–0.5)
Eosinophils Relative: 1 %
HCT: 26.7 % — ABNORMAL LOW (ref 36.0–46.0)
Hemoglobin: 8.2 g/dL — ABNORMAL LOW (ref 12.0–15.0)
Immature Granulocytes: 0 %
Lymphocytes Relative: 15 %
Lymphs Abs: 0.9 10*3/uL (ref 0.7–4.0)
MCH: 25.6 pg — ABNORMAL LOW (ref 26.0–34.0)
MCHC: 30.7 g/dL (ref 30.0–36.0)
MCV: 83.4 fL (ref 80.0–100.0)
Monocytes Absolute: 0.8 10*3/uL (ref 0.1–1.0)
Monocytes Relative: 13 %
Neutro Abs: 4.2 10*3/uL (ref 1.7–7.7)
Neutrophils Relative %: 71 %
Platelets: 226 10*3/uL (ref 150–400)
RBC: 3.2 MIL/uL — ABNORMAL LOW (ref 3.87–5.11)
RDW: 15.9 % — ABNORMAL HIGH (ref 11.5–15.5)
WBC: 5.9 10*3/uL (ref 4.0–10.5)
nRBC: 0 % (ref 0.0–0.2)

## 2021-02-06 LAB — COOXEMETRY PANEL
Carboxyhemoglobin: 5.9 % (ref 0.5–1.5)
Carboxyhemoglobin: 20.3 % (ref 0.5–1.5)
Methemoglobin: 4.9 % — ABNORMAL HIGH (ref 0.0–1.5)
Methemoglobin: 10 % — ABNORMAL HIGH (ref 0.0–1.5)
O2 Saturation: 50 %
O2 Saturation: 70.1 %
Total hemoglobin: 8.1 g/dL — ABNORMAL LOW (ref 12.0–16.0)
Total hemoglobin: 19.6 g/dL — ABNORMAL HIGH (ref 12.0–16.0)

## 2021-02-06 LAB — COMPREHENSIVE METABOLIC PANEL
ALT: 26 U/L (ref 0–44)
AST: 23 U/L (ref 15–41)
Albumin: 3.4 g/dL — ABNORMAL LOW (ref 3.5–5.0)
Alkaline Phosphatase: 58 U/L (ref 38–126)
Anion gap: 9 (ref 5–15)
BUN: 39 mg/dL — ABNORMAL HIGH (ref 8–23)
CO2: 47 mmol/L — ABNORMAL HIGH (ref 22–32)
Calcium: 8.9 mg/dL (ref 8.9–10.3)
Chloride: 81 mmol/L — ABNORMAL LOW (ref 98–111)
Creatinine, Ser: 1.7 mg/dL — ABNORMAL HIGH (ref 0.44–1.00)
GFR, Estimated: 30 mL/min — ABNORMAL LOW (ref >60)
Glucose, Bld: 148 mg/dL — ABNORMAL HIGH (ref 70–99)
Potassium: 2.8 mmol/L — ABNORMAL LOW (ref 3.5–5.1)
Sodium: 137 mmol/L (ref 135–145)
Total Bilirubin: 0.5 mg/dL (ref 0.3–1.2)
Total Protein: 6.6 g/dL (ref 6.5–8.1)

## 2021-02-06 LAB — BASIC METABOLIC PANEL
Anion gap: 6 (ref 5–15)
BUN: 42 mg/dL — ABNORMAL HIGH (ref 8–23)
CO2: 45 mmol/L — ABNORMAL HIGH (ref 22–32)
Calcium: 8.8 mg/dL — ABNORMAL LOW (ref 8.9–10.3)
Chloride: 82 mmol/L — ABNORMAL LOW (ref 98–111)
Creatinine, Ser: 1.81 mg/dL — ABNORMAL HIGH (ref 0.44–1.00)
GFR, Estimated: 28 mL/min — ABNORMAL LOW (ref >60)
Glucose, Bld: 190 mg/dL — ABNORMAL HIGH (ref 70–99)
Potassium: 4.2 mmol/L (ref 3.5–5.1)
Sodium: 133 mmol/L — ABNORMAL LOW (ref 135–145)

## 2021-02-06 LAB — TROPONIN I (HIGH SENSITIVITY)
Troponin I (High Sensitivity): 92 ng/L — ABNORMAL HIGH (ref <18)
Troponin I (High Sensitivity): 85 ng/L — ABNORMAL HIGH (ref <18)

## 2021-02-06 LAB — BLOOD GAS, ARTERIAL
Acid-Base Excess: 21.7 mmol/L — ABNORMAL HIGH (ref 0.0–2.0)
Bicarbonate: 49 mmol/L — ABNORMAL HIGH (ref 20.0–28.0)
FIO2: 44
O2 Saturation: 99.2 %
Patient temperature: 37
pCO2 arterial: 76.7 mmHg (ref 32.0–48.0)
pH, Arterial: 7.421 (ref 7.350–7.450)
pO2, Arterial: 124 mmHg — ABNORMAL HIGH (ref 83.0–108.0)

## 2021-02-06 LAB — BRAIN NATRIURETIC PEPTIDE: B Natriuretic Peptide: 469.6 pg/mL — ABNORMAL HIGH (ref 0.0–100.0)

## 2021-02-06 LAB — MAGNESIUM
Magnesium: 2.5 mg/dL — ABNORMAL HIGH (ref 1.7–2.4)
Magnesium: 2.4 mg/dL (ref 1.7–2.4)

## 2021-02-06 LAB — HIV ANTIBODY (ROUTINE TESTING W REFLEX): HIV Screen 4th Generation wRfx: NONREACTIVE

## 2021-02-06 MED ORDER — FENTANYL CITRATE (PF) 100 MCG/2ML IJ SOLN
25.0000 ug | Freq: Four times a day (QID) | INTRAMUSCULAR | Status: DC | PRN
  Administered 2021-02-06 – 2021-02-08 (×5): 25 ug via INTRAVENOUS
  Filled 2021-02-06 (×6): qty 2

## 2021-02-06 MED ORDER — ARFORMOTEROL TARTRATE 15 MCG/2ML IN NEBU
15.0000 ug | INHALATION_SOLUTION | Freq: Two times a day (BID) | RESPIRATORY_TRACT | Status: DC
  Administered 2021-02-06 – 2021-02-22 (×33): 15 ug via RESPIRATORY_TRACT
  Filled 2021-02-06 (×34): qty 2

## 2021-02-06 MED ORDER — REVEFENACIN 175 MCG/3ML IN SOLN
175.0000 ug | Freq: Every day | RESPIRATORY_TRACT | Status: DC
  Administered 2021-02-06 – 2021-02-22 (×16): 175 ug via RESPIRATORY_TRACT
  Filled 2021-02-06 (×18): qty 3

## 2021-02-06 MED ORDER — POTASSIUM CHLORIDE 20 MEQ PO PACK
40.0000 meq | PACK | Freq: Once | ORAL | Status: AC
  Administered 2021-02-06: 40 meq via ORAL
  Filled 2021-02-06: qty 2

## 2021-02-06 MED ORDER — POTASSIUM CHLORIDE 10 MEQ/100ML IV SOLN
10.0000 meq | INTRAVENOUS | Status: AC
  Administered 2021-02-06 (×4): 10 meq via INTRAVENOUS
  Filled 2021-02-06 (×4): qty 100

## 2021-02-06 NOTE — Progress Notes (Addendum)
NAME:  Mandy [name], MRN:  [id number], DOB:  [birth year], LOS: 3 ADMISSION DATE:  02/02/2021, CONSULTATION DATE:  02/05/21 REFERRING MD:  Dwyane Dee CHIEF COMPLAINT:  Hypercarbia   History of Present Illness:  Mandy [name] is a 81 y.o. F with PMH significant for severe pulmonary HTN, HTN, HL, DM, recent pleural effusions, goiter and thyroid disease and HFpEF who presented to the ED 2/1 after desaturating during  a sleep study to 30% for about 10 minutes.  She had increased pleural effusions on CXR and was requiring 4L O2, so admitted to telemetry and treated with IV Lasix.   On 2/2 she was increasingly dyspneic and ABG showed pH 7.3 with pCO2 of 77 and PCCM was consulted.    Pertinent  Medical History  Diabetes Mellitus GERD CKD IIIa Hypertension Hyperthyroidism Mild CAD Pulmonary Hypertension Stroke  Significant Hospital Events: Including procedures, antibiotic start and stop dates in addition to other pertinent events   2/1 admit to hospitalists 2/2 PCCM consult for hypercarbia 2/3 Patient had loss of consciousness with O2 desaturation with movement  Interim History / Subjective:   Tolerated bipap overnight and has been on 3L O2 when off bipap  Complaining of Chest pain this morning  Co-Ox 50%   -1.5L from yesterday  Objective   Blood pressure 120/69, pulse 74, temperature 98.2 F (36.8 C), temperature source Axillary, resp. rate 15, height 4\' 11"  (1.499 m), weight 74 kg, SpO2 100 %. CVP:  [9 mmHg-15 mmHg] 9 mmHg      Intake/Output Summary (Last 24 hours) at 02/06/2021 0731 Last data filed at 02/06/2021 0500 Gross per 24 hour  Intake 296.9 ml  Output 1800 ml  Net -1503.1 ml   Filed Weights   02/04/21 2137 02/05/21 0500 02/06/21 0500  Weight: 73.8 kg 73.8 kg 74 kg   Examination: General: elderly woman, no acute distress, bipap mask in place HENT: [place]/AT, sclera anicteric, moist mucous membranes Lungs: diminished breath sounds, non-labored, no wheezing Cardiovascular:  rrr, s1s2, no murmurs Abdomen: soft, non-tender, non-distended, bowel sounds present Extremities: trace bilateral lower extremity edema, warm. Right PICC line in place. Neuro: alert, moving all extremities  CXR 2/4 with similar appearing right pleural effusion, improved left effusion.   Resolved Hospital Problem list     Assessment & Plan:  Acute on Chronic Hypoxemic and hypercapnic Respiratory Failure Acute on Chronic Diastolic [place] Failure Pulmonary Hypertension Bilateral Pleural Effusions, transudate based on pleural fluid studies 11/25/20 - Continue PRN bipap - Continue lasix gtt and milrinone gtt per cardiology - Consider starting tadalafil 20mg  daily for pulmonary vasodilator therapy, will discuss with cardiology - Myeloma labs pending for consideration of amyloid process - Check HIV, ANA, Anti-histone ab - start LABA/LAMA nebulizer treatments via brovana + yupelri - No plan for repeat thoracentesis at this time based on prior pleural fluid studies, maintain aggressive diuresis  Chest Pain Nonobstructive CAD - cath 11/2020 - trend troponins - repeat EKG this AM  AKI on CKD IIIa - monitor renal function  Hyperthyroidism - on methimazole - TSH wnl  Anemia, Chronic normocytic - monitor  Nutrition - will need to consider cortrak placement for nutritional needs  Best Practice (right click and "Reselect all SmartList Selections" daily)   Diet/type: Regular consistency (see orders) DVT prophylaxis: prophylactic heparin  GI prophylaxis: N/A Lines: Central line - PICC Foley:  N/A Code Status:  full code Last date of multidisciplinary goals of care discussion [palliative care consulted ]  Labs   CBC: Recent Labs  Lab 02/03/21  0007 02/03/21 0532 02/04/21 0703 02/05/21 0243  WBC 8.1 6.6 6.5 6.6  HGB 9.4* 8.7* 8.4* 8.9*  HCT 30.5* 28.5* 28.1* 29.3*  MCV 84.0 84.6 85.9 84.2  PLT 266 237 222 462    Basic Metabolic Panel: Recent Labs  Lab 02/03/21 0532  02/03/21 2019 02/04/21 0703 02/04/21 1553 02/05/21 0243  NA 136 138 137 138 138  K 4.0 4.1 4.0 3.8 3.7  CL 86* 88* 86* 88* 87*  CO2 44* 42* 43* 43* 43*  GLUCOSE 133* 139* 112* 107* 133*  BUN 45* 44* 45* 44* 40*  CREATININE 1.56* 1.44* 1.50* 1.55* 1.48*  CALCIUM 9.1 8.8* 8.8* 8.9 8.9  MG 2.7*  --  2.6*  --  2.7*  PHOS  --   --   --   --  4.1   GFR: Estimated Creatinine Clearance: 27 mL/min (A) (by C-G formula based on SCr of 1.48 mg/dL (H)). Recent Labs  Lab 02/03/21 0007 02/03/21 0532 02/04/21 0703 02/05/21 0243  WBC 8.1 6.6 6.5 6.6    Liver Function Tests: No results for input(s): AST, ALT, ALKPHOS, BILITOT, PROT, ALBUMIN in the last 168 hours. No results for input(s): LIPASE, AMYLASE in the last 168 hours. No results for input(s): AMMONIA in the last 168 hours.  ABG    Component Value Date/Time   PHART 7.402 02/05/2021 0918   PCO2ART 73.8 (HH) 02/05/2021 0918   PO2ART 72.7 (L) 02/05/2021 0918   HCO3 45.0 (H) 02/05/2021 0918   TCO2 47 (H) 11/30/2020 1632   O2SAT 50.0 02/06/2021 0600     Coagulation Profile: No results for input(s): INR, PROTIME in the last 168 hours.  Cardiac Enzymes: No results for input(s): CKTOTAL, CKMB, CKMBINDEX, TROPONINI in the last 168 hours.  HbA1C: Hgb A1c MFr Bld  Date/Time Value Ref Range Status  12/07/2020 01:44 AM 5.7 (H) 4.8 - 5.6 % Final    Comment:    (NOTE) Pre diabetes:          5.7%-6.4%  Diabetes:              >6.4%  Glycemic control for   <7.0% adults with diabetes   11/03/2020 06:43 PM 5.9 (H) 4.8 - 5.6 % Final    Comment:    (NOTE) Pre diabetes:          5.7%-6.4%  Diabetes:              >6.4%  Glycemic control for   <7.0% adults with diabetes     CBG: No results for input(s): GLUCAP in the last 168 hours.   Critical care time: 43 minutes    Freda Jackson, MD [facility] Office: [phone]   See Amion for personal pager PCCM on call pager [phone] until  7pm. Please call [place] 7p-7a. [phone]

## 2021-02-06 NOTE — Progress Notes (Signed)
[facility] Progress Note Patient Name: Mandy [name] DOB: Sep 25, 1940 MRN: [id number]   Date of Service  02/06/2021  HPI/Events of Note  Patient with > 100 / hour urine output and soft blood pressures (MAP 55 mmHg) on Lasix gtt at 10 mg / hour. Most recent CVP 10. Patient is completely asymptomatic.  eICU Interventions  Lasix gtt rate reduced to 5 mg / hour and Hydralazine and Atenolol scheduled for tonight held.        Kerry Kass Prima Rayner 02/06/2021, 11:06 PM

## 2021-02-06 NOTE — Progress Notes (Signed)
Date and time results received: 02/06/21 1222 (use smartphrase ".now" to insert current time)  Test: Pco2 Critical Value: 76.7  Name of Provider Notified: Dr. Erin Fulling

## 2021-02-06 NOTE — Progress Notes (Signed)
Progress Note  Patient Name: Mandy [name] Date of Encounter: 02/06/2021  Primary Cardiologist: Donato Heinz, MD  Subjective   Resting comfortably on bipap   Inpatient Medications    Scheduled Meds:  atenolol  50 mg Oral BID   atorvastatin  80 mg Oral QPM   chlorhexidine  15 mL Mouth Rinse BID   Chlorhexidine Gluconate Cloth  6 each Topical Daily   clopidogrel  75 mg Oral Daily   heparin  5,000 Units Subcutaneous Q8H   hydrALAZINE  75 mg Oral TID   mouth rinse  15 mL Mouth Rinse BID   melatonin  10 mg Oral QHS   methimazole  2.5 mg Oral Daily   pantoprazole  40 mg Oral q morning   sodium chloride flush  10-40 mL Intracatheter Q12H   sodium chloride flush  3 mL Intravenous Q12H   Continuous Infusions:  furosemide (LASIX) 200 mg in dextrose 5% 100 mL (2mg /mL) infusion 20 mg/hr (02/05/21 2335)   milrinone 0.25 mcg/kg/min (02/06/21 0555)   PRN Meds: acetaminophen **OR** acetaminophen, albuterol, alum & mag hydroxide-simeth, fentaNYL (SUBLIMAZE) injection, hydrALAZINE, sodium chloride flush   Vital Signs    Vitals:   02/06/21 0500 02/06/21 0600 02/06/21 0700 02/06/21 0826  BP: (!) 155/38 (!) 129/31 120/69   Pulse: 74 71 74   Resp: 18 17 15    Temp:    97.7 F (36.5 C)  TempSrc:    Axillary  SpO2: 97% 98% 100%   Weight: 74 kg     Height:        Intake/Output Summary (Last 24 hours) at 02/06/2021 0835 Last data filed at 02/06/2021 0500 Gross per 24 hour  Intake 296.9 ml  Output 1800 ml  Net -1503.1 ml   Last 3 Weights 02/06/2021 02/05/2021 02/04/2021  Weight (lbs) 163 lb 2.3 oz 162 lb 11.2 oz 162 lb 11.2 oz  Weight (kg) 74 kg 73.8 kg 73.8 kg     Telemetry    NSR - Personally Reviewed  Physical Exam   Affect appropriate Chronically ill black female  HEENT: normal Neck supple with no adenopathy JVP elevated  no bruits no thyromegaly Lungs clear with no wheezing and good diaphragmatic motion Heart:  S1/S2 no murmur, no rub, gallop or click PMI  normal Abdomen: benighn, BS positve, no tenderness, no AAA no bruit.  No HSM or HJR Plus one edema Neuro non-focal Skin warm and dry No muscular weakness   Labs    High Sensitivity Troponin:   Recent Labs  Lab 02/03/21 0007 02/03/21 0316 02/05/21 0933 02/05/21 1746  TROPONINIHS 72* 59* 66* 65*      Cardiac EnzymesNo results for input(s): TROPONINI in the last 168 hours. No results for input(s): TROPIPOC in the last 168 hours.   Chemistry Recent Labs  Lab 02/04/21 0703 02/04/21 1553 02/05/21 0243  NA 137 138 138  K 4.0 3.8 3.7  CL 86* 88* 87*  CO2 43* 43* 43*  GLUCOSE 112* 107* 133*  BUN 45* 44* 40*  CREATININE 1.50* 1.55* 1.48*  CALCIUM 8.8* 8.9 8.9  GFRNONAA 35* 34* 36*  ANIONGAP 8 7 8      Hematology Recent Labs  Lab 02/03/21 0532 02/04/21 0703 02/05/21 0243  WBC 6.6 6.5 6.6  RBC 3.37* 3.27* 3.48*  HGB 8.7* 8.4* 8.9*  HCT 28.5* 28.1* 29.3*  MCV 84.6 85.9 84.2  MCH 25.8* 25.7* 25.6*  MCHC 30.5 29.9* 30.4  RDW 16.1* 16.0* 16.3*  PLT 237 222 242  BNP Recent Labs  Lab 02/03/21 0007 02/05/21 0933  BNP 670.8* 1,051.4*     DDimer No results for input(s): DDIMER in the last 168 hours.   Radiology    DG CHEST PORT 1 VIEW  Result Date: 02/05/2021 CLINICAL DATA:  Pulmonary edema EXAM: PORTABLE CHEST 1 VIEW COMPARISON:  02/03/2021 FINDINGS: Unchanged cardiomegaly. Diffuse interstitial and lower lung predominant alveolar opacities, not simply changed from prior. Moderate right and small left pleural effusions, unchanged. Unchanged bibasilar opacities. No visible pneumothorax. Bilateral shoulder osteoarthritis. No acute osseous abnormality. IMPRESSION: Unchanged moderate pulmonary edema, moderate right and small left pleural effusions with adjacent basilar opacities. Electronically Signed   By: Maurine Simmering M.D.   On: 02/05/2021 09:04   ECHOCARDIOGRAM COMPLETE  Result Date: 02/05/2021    ECHOCARDIOGRAM REPORT   Patient Name:   Mandy [name] Date of Exam:  02/05/2021 Medical Rec #:  [id number]      Height:       59.0 in Accession #:    [id number]     Weight:       162.7 lb Date of Birth:  1940/07/11       BSA:          1.689 m Patient Age:    81 years       BP:           188/80 mmHg Patient Gender: F              HR:           65 bpm. Exam Location:  Inpatient Procedure: 2D Echo Indications:    Pulmonary hypertension  History:        Patient has prior history of Echocardiogram examinations, most                 recent 12/15/2020. CHF, Pulmonary HTN; Risk Factors:Diabetes.  Sonographer:    Jefferey Pica Referring Phys: [place]  1. Severe pulmonary hypertension estimated PA systolic 093 mmHg.  2. Left ventricular ejection fraction, by estimation, is 60 to 65%. The left ventricle has normal function. The left ventricle has no regional wall motion abnormalities. There is severe left ventricular hypertrophy. Left ventricular diastolic parameters  are consistent with Grade I diastolic dysfunction (impaired relaxation).  3. Right ventricular systolic function is moderately reduced. The right ventricular size is moderately enlarged. There is severely elevated pulmonary artery systolic pressure.  4. The pericardial effusion is posterior to the left ventricle.  5. The mitral valve is abnormal. Trivial mitral valve regurgitation. No evidence of mitral stenosis.  6. Tricuspid valve regurgitation is severe.  7. The aortic valve is tricuspid. There is mild calcification of the aortic valve. Aortic valve regurgitation is not visualized. Aortic valve sclerosis is present, with no evidence of aortic valve stenosis.  8. The inferior vena cava is dilated in size with <50% respiratory variability, suggesting right atrial pressure of 15 mmHg. FINDINGS  Left Ventricle: Left ventricular ejection fraction, by estimation, is 60 to 65%. The left ventricle has normal function. The left ventricle has no regional wall motion abnormalities. The left ventricular internal cavity  size was normal in size. There is  severe left ventricular hypertrophy. Left ventricular diastolic parameters are consistent with Grade I diastolic dysfunction (impaired relaxation). Right Ventricle: The right ventricular size is moderately enlarged. No increase in right ventricular wall thickness. Right ventricular systolic function is moderately reduced. There is severely elevated pulmonary artery systolic pressure. The tricuspid regurgitant velocity is 4.61 m/s, and  with an assumed right atrial pressure of 15 mmHg, the estimated right ventricular systolic pressure is 735.3 mmHg. Left Atrium: Left atrial size was normal in size. Right Atrium: Right atrial size was normal in size. Pericardium: Trivial pericardial effusion is present. The pericardial effusion is posterior to the left ventricle. Mitral Valve: The mitral valve is abnormal. There is mild thickening of the mitral valve leaflet(s). There is mild calcification of the mitral valve leaflet(s). Trivial mitral valve regurgitation. No evidence of mitral valve stenosis. Tricuspid Valve: The tricuspid valve is normal in structure. Tricuspid valve regurgitation is severe. No evidence of tricuspid stenosis. Aortic Valve: The aortic valve is tricuspid. There is mild calcification of the aortic valve. Aortic valve regurgitation is not visualized. Aortic valve sclerosis is present, with no evidence of aortic valve stenosis. Aortic valve peak gradient measures 10.5 mmHg. Pulmonic Valve: The pulmonic valve was normal in structure. Pulmonic valve regurgitation is mild. No evidence of pulmonic stenosis. Aorta: The aortic root is normal in size and structure. Venous: The inferior vena cava is dilated in size with less than 50% respiratory variability, suggesting right atrial pressure of 15 mmHg. IAS/Shunts: No atrial level shunt detected by color flow Doppler. Additional Comments: Severe pulmonary hypertension estimated PA systolic 299 mmHg. There is a small pleural  effusion in the left lateral region.  LEFT VENTRICLE PLAX 2D LVIDd:         4.20 cm   Diastology LVIDs:         2.30 cm   LV e' medial:    4.11 cm/s LV PW:         1.60 cm   LV E/e' medial:  13.6 LV IVS:        1.70 cm   LV e' lateral:   5.18 cm/s LVOT diam:     2.00 cm   LV E/e' lateral: 10.8 LV SV:         90 LV SV Index:   53 LVOT Area:     3.14 cm  RIGHT VENTRICLE            IVC RV Basal diam:  3.30 cm    IVC diam: 2.60 cm RV Mid diam:    3.60 cm RV S prime:     8.05 cm/s TAPSE (M-mode): 1.8 cm LEFT ATRIUM             Index        RIGHT ATRIUM           Index LA diam:        3.50 cm 2.07 cm/m   RA Area:     21.30 cm LA Vol (A2C):   67.5 ml 39.95 ml/m  RA Volume:   65.90 ml  39.01 ml/m LA Vol (A4C):   37.8 ml 22.37 ml/m LA Biplane Vol: 51.3 ml 30.36 ml/m  AORTIC VALVE                 PULMONIC VALVE AV Area (Vmax): 2.31 cm     PV Vmax:       0.75 m/s AV Vmax:        162.00 cm/s  PV Peak grad:  2.2 mmHg AV Peak Grad:   10.5 mmHg LVOT Vmax:      119.00 cm/s LVOT Vmean:     66.500 cm/s LVOT VTI:       0.285 m  AORTA Ao Root diam: 3.40 cm Ao Asc diam:  3.60 cm MITRAL VALVE  TRICUSPID VALVE MV Area (PHT): 3.60 cm     TR Peak grad:   85.0 mmHg MV Decel Time: 211 msec     TR Vmax:        461.00 cm/s MV E velocity: 55.70 cm/s MV A velocity: 100.00 cm/s  SHUNTS MV E/A ratio:  0.56         Systemic VTI:  0.29 m                             Systemic Diam: 2.00 cm Jenkins Rouge MD Electronically signed by Jenkins Rouge MD Signature Date/Time: 02/05/2021/12:04:34 PM    Final    [place] EKG SITE RITE  Result Date: 02/05/2021 If Site Rite image not attached, placement could not be confirmed due to current cardiac rhythm.   Cardiac Studies   2d echo 02/05/21 EF 69065% moderate RV failure estimated PA systolic 540 mmHg severe TR   Patient Profile     81 y.o. 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 Has hx of prior Covid PNA with hypoxemia in 02/2019 as well as 06/2020. 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 Between 09/2020 and now has had multiple readmissions for recurrent a/c respiratory failure and pleural effusions. Admitted with same 02/03/21, transported from sleep center with hypoxia.  Assessment & Plan    1. Recurrent acute on chronic hypoxic respiratory failure and recurrent right pleural effusion with acute on chronic diastolic CHF and severe pulmonary HTN - nice response to lasix drip and milrinone  -Negative 1.5 Liters breathing better - severe pulmonary HTN Vasodilatory effect of milrinone helping But not a long term solution  -Appreciated Dr Jeffie Pollock input - Coox 50% today was 62% yesterday   2. Essential HTN - BP acceptable, manage in context above  3. AKI on CKD stage 3a with hyperkalemia - Cr improved 1.55-> 1.48 with inotrope   4. H/o cryptogenic stroke 12/2020 as well as question of prior renal infarct 2020  - continue Plavix - need to consider loop recorder when more stable (pt/family wanted to hold off earlier this month)  5. Nonobstructive CAD by cath 11/2020 - minimally elevated troponin likely demand process  6. Thyroid disease - TSH normal 1.75   7. Chronic appearing anemia, recent range 7-9 - stable 29.3   Continue milrinone and lasix drip over weekend Long term prognosis remains guarded   For questions or updates, please contact [place] Please consult [email or web address] for contact info under Cardiology/STEMI.  Signed, Jenkins Rouge, MD 02/06/2021, 8:35 AM

## 2021-02-06 NOTE — Progress Notes (Signed)
Date and time results received: 02/06/21 0957 (use smartphrase ".now" to insert current time)  Test: Carboxyhemoglobin Critical Value: 20.3  Name of Provider Notified: Dr. Erin Fulling

## 2021-02-06 NOTE — Progress Notes (Addendum)
Date and time results received: 02/06/21 0715 (use smartphrase ".now" to insert current time)  Test: Carboxyhemoglobin Critical Value: 5.9  Name of Provider Notified: Dr. Erin Fulling

## 2021-02-07 DIAGNOSIS — J9621 Acute and chronic respiratory failure with hypoxia: Secondary | ICD-10-CM | POA: Diagnosis not present

## 2021-02-07 DIAGNOSIS — J9622 Acute and chronic respiratory failure with hypercapnia: Secondary | ICD-10-CM | POA: Diagnosis not present

## 2021-02-07 DIAGNOSIS — Z7189 Other specified counseling: Secondary | ICD-10-CM

## 2021-02-07 DIAGNOSIS — Z515 Encounter for palliative care: Secondary | ICD-10-CM

## 2021-02-07 DIAGNOSIS — I5033 Acute on chronic diastolic (congestive) heart failure: Secondary | ICD-10-CM | POA: Diagnosis not present

## 2021-02-07 DIAGNOSIS — I272 Pulmonary hypertension, unspecified: Secondary | ICD-10-CM | POA: Diagnosis not present

## 2021-02-07 DIAGNOSIS — R531 Weakness: Secondary | ICD-10-CM

## 2021-02-07 LAB — BASIC METABOLIC PANEL
BUN: 40 mg/dL — ABNORMAL HIGH (ref 8–23)
CO2: >45 mmol/L — ABNORMAL HIGH (ref 22–32)
Calcium: 8.9 mg/dL (ref 8.9–10.3)
Chloride: 80 mmol/L — ABNORMAL LOW (ref 98–111)
Creatinine, Ser: 1.8 mg/dL — ABNORMAL HIGH (ref 0.44–1.00)
GFR, Estimated: 28 mL/min — ABNORMAL LOW (ref >60)
Glucose, Bld: 136 mg/dL — ABNORMAL HIGH (ref 70–99)
Potassium: 3.1 mmol/L — ABNORMAL LOW (ref 3.5–5.1)
Sodium: 136 mmol/L (ref 135–145)

## 2021-02-07 LAB — MAGNESIUM: Magnesium: 2.3 mg/dL (ref 1.7–2.4)

## 2021-02-07 LAB — CBC
HCT: 25.8 % — ABNORMAL LOW (ref 36.0–46.0)
Hemoglobin: 8 g/dL — ABNORMAL LOW (ref 12.0–15.0)
MCH: 25.5 pg — ABNORMAL LOW (ref 26.0–34.0)
MCHC: 31 g/dL (ref 30.0–36.0)
MCV: 82.2 fL (ref 80.0–100.0)
Platelets: 228 10*3/uL (ref 150–400)
RBC: 3.14 MIL/uL — ABNORMAL LOW (ref 3.87–5.11)
RDW: 15.6 % — ABNORMAL HIGH (ref 11.5–15.5)
WBC: 6.1 10*3/uL (ref 4.0–10.5)
nRBC: 0 % (ref 0.0–0.2)

## 2021-02-07 LAB — COOXEMETRY PANEL
Carboxyhemoglobin: 1.5 % (ref 0.5–1.5)
Methemoglobin: 1.3 % (ref 0.0–1.5)
O2 Saturation: 69.9 %
Total hemoglobin: 10.1 g/dL — ABNORMAL LOW (ref 12.0–16.0)

## 2021-02-07 MED ORDER — POTASSIUM CHLORIDE 20 MEQ PO PACK
40.0000 meq | PACK | Freq: Two times a day (BID) | ORAL | Status: AC
  Administered 2021-02-07: 40 meq via ORAL
  Filled 2021-02-07 (×2): qty 2

## 2021-02-07 MED ORDER — POTASSIUM CHLORIDE 10 MEQ/100ML IV SOLN
10.0000 meq | INTRAVENOUS | Status: AC
  Administered 2021-02-07 (×4): 10 meq via INTRAVENOUS
  Filled 2021-02-07 (×4): qty 100

## 2021-02-07 MED ORDER — ALUM & MAG HYDROXIDE-SIMETH 200-200-20 MG/5ML PO SUSP
15.0000 mL | Freq: Four times a day (QID) | ORAL | Status: DC | PRN
  Administered 2021-02-07 – 2021-02-19 (×3): 15 mL via ORAL
  Filled 2021-02-07 (×3): qty 30

## 2021-02-07 NOTE — Progress Notes (Addendum)
NAME:  Mandy [name], MRN:  [id number], DOB:  1940/04/24, LOS: 4 ADMISSION DATE:  02/02/2021, CONSULTATION DATE:  02/05/21 REFERRING MD:  Dwyane Dee CHIEF COMPLAINT:  Hypercarbia   History of Present Illness:  Mandy [name] is a 81 y.o. F with PMH significant for severe pulmonary HTN, HTN, HL, DM, recent pleural effusions, goiter and thyroid disease and HFpEF who presented to the ED 2/1 after desaturating during  a sleep study to 30% for about 10 minutes.  She had increased pleural effusions on CXR and was requiring 4L O2, so admitted to telemetry and treated with IV Lasix.   On 2/2 she was increasingly dyspneic and ABG showed pH 7.3 with pCO2 of 77 and PCCM was consulted.    Pertinent  Medical History  Diabetes Mellitus GERD CKD IIIa Hypertension Hyperthyroidism Mild CAD Pulmonary Hypertension Stroke  Significant Hospital Events: Including procedures, antibiotic start and stop dates in addition to other pertinent events   2/1 admit to hospitalists 2/2 PCCM consult for hypercarbia 2/3 Patient had loss of consciousness with O2 desaturation with movement  Interim History / Subjective:   Tolerated bipap overnight and has been on 3L O2 when off bipap  Episodes of chest pain when oxygen canula falls out of her nose  Lasix drip lowered to 10mg /hr due to rising renal function then decreased to 5mg  overnight due to soft blood pressures.   Co-Ox 69%   Net evan from yesterday, -2L for the admission  Objective   Blood pressure (!) 165/42, pulse 86, temperature 98 F (36.7 C), temperature source Axillary, resp. rate 19, height 4\' 11"  (1.499 m), weight 73.9 kg, SpO2 94 %. CVP:  [5 mmHg-10 mmHg] 5 mmHg      Intake/Output Summary (Last 24 hours) at 02/07/2021 0736 Last data filed at 02/07/2021 0600 Gross per 24 hour  Intake 1462.7 ml  Output 1425 ml  Net 37.7 ml   Filed Weights   02/05/21 0500 02/06/21 0500 02/07/21 0500  Weight: 73.8 kg 74 kg 73.9 kg   Examination: General: elderly  woman, no acute distress, nasal canula in place HENT: [place]/AT, sclera anicteric, moist mucous membranes Lungs: diminished breath sounds, rales left base, non-labored, no wheezing Cardiovascular: rrr, s1s2, no murmurs Abdomen: soft, non-tender, non-distended, bowel sounds present Extremities: no edema, warm. Right PICC line in place. Neuro: alert, moving all extremities  Resolved Hospital Problem list     Assessment & Plan:  Acute on Chronic Hypoxemic and hypercapnic Respiratory Failure Acute on Chronic Diastolic Feart Failure Pulmonary Hypertension Bilateral Pleural Effusions, transudate based on pleural fluid studies 11/25/20 - Continue PRN bipap - Continue lasix gtt and reduce milrinone gtt to 0.125 today. If tolerates taper, will stop milrinone tomorrow and start tadalafil 20mg  daily - Myeloma labs pending for consideration of amyloid process - HIV screen negative - Check ANA, Anti-histone ab - Continue LABA/LAMA nebulizer treatments via brovana + yupelri - No plan for repeat thoracentesis at this time based on prior pleural fluid studies, maintain aggressive diuresis  Chest Pain Nonobstructive CAD - cath 11/2020 - trend troponins - repeat EKG this AM  AKI on CKD IIIa - monitor renal function  Hyperthyroidism - on methimazole - TSH wnl  Anemia, Chronic normocytic - monitor  Nutrition - will need to consider cortrak placement for nutritional needs  Best Practice (right click and "Reselect all SmartList Selections" daily)   Diet/type: Regular consistency (see orders) DVT prophylaxis: prophylactic heparin  GI prophylaxis: N/A Lines: Central line - PICC Foley:  N/A Code Status:  full code Last date of multidisciplinary goals of care discussion [facility] care consulted ]  Labs   CBC: Recent Labs  Lab 02/03/21 0532 02/04/21 0703 02/05/21 0243 02/06/21 0918 02/07/21 0500  WBC 6.6 6.5 6.6 5.9 6.1  NEUTROABS  --   --   --  4.2  --   HGB 8.7* 8.4* 8.9* 8.2*  8.0*  HCT 28.5* 28.1* 29.3* 26.7* 25.8*  MCV 84.6 85.9 84.2 83.4 82.2  PLT 237 222 242 226 144    Basic Metabolic Panel: Recent Labs  Lab 02/04/21 0703 02/04/21 1553 02/05/21 0243 02/06/21 0918 02/06/21 1300 02/06/21 1600 02/07/21 0500  NA 137 138 138 137  --  133* 136  K 4.0 3.8 3.7 2.8*  --  4.2 3.1*  CL 86* 88* 87* 81*  --  82* 80*  CO2 43* 43* 43* 47*  --  45* >45*  GLUCOSE 112* 107* 133* 148*  --  190* 136*  BUN 45* 44* 40* 39*  --  42* 40*  CREATININE 1.50* 1.55* 1.48* 1.70*  --  1.81* 1.80*  CALCIUM 8.8* 8.9 8.9 8.9  --  8.8* 8.9  MG 2.6*  --  2.7*  --  2.5* 2.4 2.3  PHOS  --   --  4.1  --   --   --   --    GFR: Estimated Creatinine Clearance: 22.2 mL/min (A) (by C-G formula based on SCr of 1.8 mg/dL (H)). Recent Labs  Lab 02/04/21 0703 02/05/21 0243 02/06/21 0918 02/07/21 0500  WBC 6.5 6.6 5.9 6.1    Liver Function Tests: Recent Labs  Lab 02/06/21 0918  AST 23  ALT 26  ALKPHOS 58  BILITOT 0.5  PROT 6.6  ALBUMIN 3.4*   No results for input(s): LIPASE, AMYLASE in the last 168 hours. No results for input(s): AMMONIA in the last 168 hours.  ABG    Component Value Date/Time   PHART 7.421 02/06/2021 1210   PCO2ART 76.7 (HH) 02/06/2021 1210   PO2ART 124 (H) 02/06/2021 1210   HCO3 49.0 (H) 02/06/2021 1210   TCO2 47 (H) 11/30/2020 1632   O2SAT 69.9 02/07/2021 0500     Coagulation Profile: No results for input(s): INR, PROTIME in the last 168 hours.  Cardiac Enzymes: No results for input(s): CKTOTAL, CKMB, CKMBINDEX, TROPONINI in the last 168 hours.  HbA1C: Hgb A1c MFr Bld  Date/Time Value Ref Range Status  12/07/2020 01:44 AM 5.7 (H) 4.8 - 5.6 % Final    Comment:    (NOTE) Pre diabetes:          5.7%-6.4%  Diabetes:              >6.4%  Glycemic control for   <7.0% adults with diabetes   11/03/2020 06:43 PM 5.9 (H) 4.8 - 5.6 % Final    Comment:    (NOTE) Pre diabetes:          5.7%-6.4%  Diabetes:              >6.4%  Glycemic  control for   <7.0% adults with diabetes     CBG: No results for input(s): GLUCAP in the last 168 hours.   Critical care time: 35 minutes    Freda Jackson, MD [facility] Office: [phone]   See Amion for personal pager PCCM on call pager [phone] until 7pm. Please call [place] 7p-7a. [phone]

## 2021-02-07 NOTE — Consult Note (Signed)
Consultation Note Date: 02/07/2021   Patient Name: Mandy [name]  DOB: 29-Jul-1940  MRN: [id number]  Age / Sex: 81 y.o., female  PCP: [name], MD Referring Physician: Freddi Starr, MD  Reason for Consultation: Establishing goals of care  HPI/Patient Profile: 81 y.o. 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 History of prior COVID-pneumonia with hypoxia requiring hospitalization in both 2021 as well as 2022. Patient with ongoing multiple recurrent admissions for acute respiratory failure with pleural effusions. Patient admitted to the ICU at [facility] currently with recurrent acute on chronic hypoxic respiratory failure, recurrent right pleural effusion, acute on chronic diastolic congestive heart failure in the setting of severe pulmonary hypertension. Patient is being followed by cardiologist and remains admitted under pulmonary critical care service.  She is noted to have had a good response to Lasix drip as well as milrinone. Palliative consultation has been requested for CODE STATUS and goals of care discussions.  Patient known to palliative service, seen in a previous hospitalization for goals of care discussions.  Chart reviewed.  Patient seen and examined.  Daughter [name] was at bedside. Palliative medicine is specialized medical care for people living with serious illness. It focuses on providing relief from the symptoms and stress of a  serious illness. The goal is to improve quality of life for both the patient and the family. Goals of care: Broad aims of medical therapy in relation to the patient's values and preferences. Our aim is to provide medical care aimed at enabling patients to achieve the goals that matter most to them, given the circumstances of their particular medical situation and their constraints.    NEXT OF KIN Daughter [name] who is present at the bedside, patient is originally from [place]  SUMMARY OF RECOMMENDATIONS   CODE STATUS and goals of care discussions undertaken with patient as well as daughter [name] who was present at the bedside.  Both patient and daughter speak [place] Meuse, and English baseline which, they were able to understand and answer questions appropriately.  Patient understands why she is in the hospital, she states that the doctors and the rest of the staff have been doing a good job of explaining things to her.  She realizes the serious nature of her condition.  We discussed extensively about differences between full code versus DNR DNI.  Patient states that this has not been addressed before as well.  She repeats that she wants the full scope of a resuscitative attempt and is able to verbalize that she understands what happens inside a CODE BLUE: CPR, intubation mechanical ventilation etc. this is similar to what patient and family have expressed and a prior  palliative care consultation done by my colleague Dr. Domingo Cocking in a previous hospitalization.  Offered some gentle education regarding how palliative services can help going forward.  For now, patient and family wish to continue with current mode of care, we will follow peripherally. Thank you for the consult.   Code Status/Advance Care Planning: Full code   Symptom Management:     Palliative Prophylaxis:  Delirium Protocol  Additional Recommendations (Limitations, Scope, Preferences): Full Scope  Treatment  Psycho-social/Spiritual:  Desire for further Chaplaincy support:yes Additional Recommendations: Caregiving  Support/Resources  Prognosis:  Unable to determine  Discharge Planning: To Be Determined      Primary Diagnoses: Present on Admission:  Acute on chronic respiratory failure with hypoxia and hypercapnia (HCC)  Acute on chronic diastolic CHF (congestive heart failure) (HCC)  Severe pulmonary hypertension (HCC)  Hyperthyroidism  Hyperkalemia  Acute renal failure superimposed on stage 3a chronic kidney disease ([place])   I have reviewed the medical record, interviewed the patient and family, and examined the patient. The following aspects are pertinent.  Past Medical History:  Diagnosis Date   Aortic atherosclerosis ([place]) 10/08/2018   Arthritis    Chronic kidney disease, stage 3a ([place])    Chronic respiratory failure (HCC)    Diabetes mellitus without complication (HCC)    GERD (gastroesophageal reflux disease)    Hepatic steatosis 10/08/2018   Hyperlipidemia    Hypertension    Hyperthyroidism    Mass of right ovary 10/08/2018   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/w benign thyroid tissue   Mild CAD    Osteoporosis    Pneumonia    Renal infarct (HCC)    SBO (small bowel obstruction) ([place]) 08/09/2018   Severe pulmonary hypertension ([place])    Stroke (cerebrum) (HCC)    Social History   Socioeconomic History   Marital status: Widowed    Spouse name: Not on file   Number of children: Not on file   Years of education: Not on file   Highest education level: Not on file  Occupational History   Not on file  Tobacco Use   Smoking status: Never   Smokeless tobacco: Never  Vaping Use   Vaping Use: Never used  Substance and Sexual Activity   Alcohol use: No   Drug use: No   Sexual activity: Not on file  Other Topics Concern   Not on file  Social History Narrative   Patient is from [place]   Speaks Krio   Social Determinants of Health    Financial Resource Strain: Not on file  Food Insecurity: Not on file  Transportation Needs: Not on file  Physical Activity: Not on file  Stress: Not on file  Social Connections: Not on file   Family History  Problem Relation Age of Onset   Kidney disease Son    Colon cancer Neg Hx    Breast cancer Neg Hx    Scheduled Meds:  arformoterol  15 mcg Nebulization BID   atenolol  50 mg Oral BID   atorvastatin  80 mg Oral QPM   chlorhexidine  15 mL Mouth Rinse BID   Chlorhexidine Gluconate Cloth  6 each Topical Daily   clopidogrel  75 mg Oral Daily   heparin  5,000 Units Subcutaneous Q8H   hydrALAZINE  75 mg Oral TID   mouth rinse  15 mL Mouth Rinse BID   melatonin  10 mg Oral QHS   methimazole  2.5 mg Oral Daily   pantoprazole  40  mg Oral q morning   potassium chloride  40 mEq Oral BID   revefenacin  175 mcg Nebulization Daily   sodium chloride flush  10-40 mL Intracatheter Q12H   sodium chloride flush  3 mL Intravenous Q12H   Continuous Infusions:  furosemide (LASIX) 200 mg in dextrose 5% 100 mL (2mg /mL) infusion 4 mg/hr (02/07/21 1240)   milrinone 0.125 mcg/kg/min (02/07/21 1240)   PRN Meds:.acetaminophen **OR** acetaminophen, albuterol, fentaNYL (SUBLIMAZE) injection, hydrALAZINE, sodium chloride flush Medications Prior to Admission:  Prior to Admission medications   Medication Sig Start Date End Date Taking? Authorizing Provider  acetaminophen (TYLENOL) 500 MG tablet Take 1,000 mg by mouth every 6 (six) hours as needed for fever or headache (pain).   Yes [provider]  albuterol (PROVENTIL) (2.5 MG/3ML) 0.083% nebulizer solution Take 2.5 mg by nebulization every 6 (six) hours as needed for wheezing or shortness of breath.   Yes [provider]  albuterol (VENTOLIN HFA) 108 (90 Base) MCG/ACT inhaler Inhale 2 puffs into the lungs every 6 (six) hours as needed for wheezing or shortness of breath. 06/30/20  Yes Thurnell Lose, MD  alendronate (FOSAMAX) 70  MG tablet Take 70 mg by mouth every Tuesday. Take with a full glass of water on an empty stomach.   Yes [provider]  atenolol (TENORMIN) 50 MG tablet Take 50 mg by mouth 2 (two) times daily.   Yes [provider]  calcium carbonate (OSCAL) 1500 (600 Ca) MG TABS tablet Take 600 mg of elemental calcium by mouth 2 (two) times daily with a meal.   Yes [provider]  cetirizine (ZYRTEC) 10 MG tablet Take 10 mg by mouth daily. 12/31/20  Yes [provider]  clopidogrel (PLAVIX) 75 MG tablet Take 1 tablet (75 mg total) by mouth daily. 12/08/20  Yes Bonnielee Haff, MD  diclofenac sodium (VOLTAREN) 1 % GEL Apply 4 g topically 4 (four) times daily as needed for pain. shoulder 09/13/18  Yes [provider]  gabapentin (NEURONTIN) 100 MG capsule Take 100 mg by mouth at bedtime. 07/18/18  Yes [provider]  hydrALAZINE (APRESOLINE) 100 MG tablet Take 0.5 tablets (50 mg total) by mouth 3 (three) times daily. 10/09/20  Yes Nicole Kindred A, DO  melatonin 5 MG TABS Take 10 mg by mouth at bedtime.   Yes [provider]  methimazole (TAPAZOLE) 5 MG tablet Take 0.5 tablets (2.5 mg total) by mouth daily. 02/04/19  Yes Hosie Poisson, MD  Multiple Vitamin (MULTIVITAMIN WITH MINERALS) TABS tablet Take 1 tablet by mouth every morning.   Yes [provider]  Omega-3 Fatty Acids (FISH OIL PO) Take 1 capsule by mouth every morning.   Yes [provider]  OXYGEN Inhale 4 L into the lungs continuous.   Yes [provider]  pantoprazole (PROTONIX) 40 MG tablet Take 1 tablet (40 mg total) by mouth daily at 6 (six) AM. Patient taking differently: Take 40 mg by mouth every morning. 06/30/20  Yes Thurnell Lose, MD  potassium chloride SA (KLOR-CON M) 20 MEQ tablet Take 1 tablet (20 mEq total) by mouth daily. 12/08/20  Yes Bonnielee Haff, MD  simvastatin (ZOCOR) 40 MG tablet Take 1 tablet (40 mg total) by mouth at bedtime. 12/08/20  Yes  Bonnielee Haff, MD  torsemide 40 MG TABS Take 40 mg by mouth in the morning and at bedtime. 01/25/21 02/24/21 Yes Baldwin [place], PA-C  vitamin B-12 (CYANOCOBALAMIN) 1000 MCG tablet Take 1,000 mcg by mouth  daily.   Yes [provider]  vitamin C (ASCORBIC ACID) 500 MG tablet Take 500 mg by mouth daily.   Yes [provider]  Vitamin D, Cholecalciferol, 25 MCG (1000 UT) TABS Take 1,000 Units by mouth daily.   Yes [provider]  zinc gluconate 50 MG tablet Take 50 mg by mouth daily.   Yes [provider]  diazepam (VALIUM) 2 MG tablet Take 2 mg by mouth every 12 (twelve) hours as needed for anxiety. Patient not taking: Reported on 02/03/2021    [provider]   No Known Allergies Review of Systems Episodic chest pain, not experiencing chest pain currently Physical Exam Elderly appearing African-American female Resting in bed Currently on BiPAP Regular work of breathing denies any sensation of shortness of breath or of chest discomfort Regular breath sounds S1-S2 monitor noted Has some edema Appears to have generalized weakness No focal deficits  Vital Signs: BP 108/88    Pulse 83    Temp 98.5 F (36.9 C) (Axillary)    Resp 17    Ht 4\' 11"  (1.499 m)    Wt 73.9 kg    SpO2 98%    BMI 32.91 kg/m  Pain Scale: 0-10 POSS *See Group Information*: 1-Acceptable,Awake and alert Pain Score: 0-No pain   SpO2: SpO2: 98 % O2 Device:SpO2: 98 % O2 Flow Rate: .O2 Flow Rate (L/min): 5 L/min  IO: Intake/output summary:  Intake/Output Summary (Last 24 hours) at 02/07/2021 1454 Last data filed at 02/07/2021 1240 Gross per 24 hour  Intake 1324.79 ml  Output 1000 ml  Net 324.79 ml    LBM: Last BM Date: 02/06/21 Baseline Weight: Weight: 74.8 kg Most recent weight: Weight: 73.9 kg     Palliative Assessment/Data:   PPS 40%  Time In:  1340 Time Out:  1440 Time Total:  60  Greater than 50%  of this time was spent counseling and coordinating care  related to the above assessment and plan.  Signed by: Loistine Chance, MD   Please contact Palliative Medicine Team phone at [phone] for questions and concerns.  For individual provider: See Shea Evans

## 2021-02-07 NOTE — Progress Notes (Signed)
Progress Note  Patient Name: Mandy [name] Date of Encounter: 02/07/2021  Primary Cardiologist: Donato Heinz, MD  Subjective   Resting comfortably only using 2L [place] now   Inpatient Medications    Scheduled Meds:  arformoterol  15 mcg Nebulization BID   atenolol  50 mg Oral BID   atorvastatin  80 mg Oral QPM   chlorhexidine  15 mL Mouth Rinse BID   Chlorhexidine Gluconate Cloth  6 each Topical Daily   clopidogrel  75 mg Oral Daily   heparin  5,000 Units Subcutaneous Q8H   hydrALAZINE  75 mg Oral TID   mouth rinse  15 mL Mouth Rinse BID   melatonin  10 mg Oral QHS   methimazole  2.5 mg Oral Daily   pantoprazole  40 mg Oral q morning   potassium chloride  40 mEq Oral BID   revefenacin  175 mcg Nebulization Daily   sodium chloride flush  10-40 mL Intracatheter Q12H   sodium chloride flush  3 mL Intravenous Q12H   Continuous Infusions:  furosemide (LASIX) 200 mg in dextrose 5% 100 mL (2mg /mL) infusion 4 mg/hr (02/07/21 0230)   milrinone 0.125 mcg/kg/min (02/07/21 0918)   potassium chloride 10 mEq (02/07/21 0907)   PRN Meds: acetaminophen **OR** acetaminophen, albuterol, fentaNYL (SUBLIMAZE) injection, hydrALAZINE, sodium chloride flush   Vital Signs    Vitals:   02/07/21 0805 02/07/21 0834 02/07/21 0841 02/07/21 0908  BP:  (!) 188/82  (!) 171/60  Pulse:    (!) 119  Resp:      Temp: 98.5 F (36.9 C)     TempSrc: Axillary     SpO2:   90%   Weight:      Height:        Intake/Output Summary (Last 24 hours) at 02/07/2021 1000 Last data filed at 02/07/2021 0920 Gross per 24 hour  Intake 1475.7 ml  Output 1425 ml  Net 50.7 ml   Last 3 Weights 02/07/2021 02/06/2021 02/05/2021  Weight (lbs) 162 lb 14.7 oz 163 lb 2.3 oz 162 lb 11.2 oz  Weight (kg) 73.9 kg 74 kg 73.8 kg     Telemetry    NSR - Personally Reviewed  Physical Exam   Affect appropriate Chronically ill black female  HEENT: normal Neck supple with no adenopathy JVP elevated  no bruits no  thyromegaly Lungs clear with no wheezing and good diaphragmatic motion Heart:  S1/S2 no murmur, no rub, gallop or click PMI normal Abdomen: benighn, BS positve, no tenderness, no AAA no bruit.  No HSM or HJR Plus one edema Neuro non-focal Skin warm and dry No muscular weakness   Labs    High Sensitivity Troponin:   Recent Labs  Lab 02/03/21 0316 02/05/21 0933 02/05/21 1746 02/06/21 0854 02/06/21 1300  TROPONINIHS 59* 66* 65* 92* 85*      Cardiac EnzymesNo results for input(s): TROPONINI in the last 168 hours. No results for input(s): TROPIPOC in the last 168 hours.   Chemistry Recent Labs  Lab 02/06/21 0918 02/06/21 1600 02/07/21 0500  NA 137 133* 136  K 2.8* 4.2 3.1*  CL 81* 82* 80*  CO2 47* 45* >45*  GLUCOSE 148* 190* 136*  BUN 39* 42* 40*  CREATININE 1.70* 1.81* 1.80*  CALCIUM 8.9 8.8* 8.9  PROT 6.6  --   --   ALBUMIN 3.4*  --   --   AST 23  --   --   ALT 26  --   --   ALKPHOS  58  --   --   BILITOT 0.5  --   --   GFRNONAA 30* 28* 28*  ANIONGAP 9 6 NOT CALCULATED     Hematology Recent Labs  Lab 02/05/21 0243 02/06/21 0918 02/07/21 0500  WBC 6.6 5.9 6.1  RBC 3.48* 3.20* 3.14*  HGB 8.9* 8.2* 8.0*  HCT 29.3* 26.7* 25.8*  MCV 84.2 83.4 82.2  MCH 25.6* 25.6* 25.5*  MCHC 30.4 30.7 31.0  RDW 16.3* 15.9* 15.6*  PLT 242 226 228    BNP Recent Labs  Lab 02/03/21 0007 02/05/21 0933 02/06/21 0918  BNP 670.8* 1,051.4* 469.6*     DDimer No results for input(s): DDIMER in the last 168 hours.   Radiology    DG CHEST PORT 1 VIEW  Result Date: 02/06/2021 CLINICAL DATA:  Difficulty breathing EXAM: PORTABLE CHEST 1 VIEW COMPARISON:  Previous studies including the examination of 02/05/2021 FINDINGS: Transverse diameter of heart is increased. Central pulmonary vessels are prominent. Moderate to large right pleural effusion is seen. Small left pleural effusion is present. There is some improvement in aeration of left lower lung fields. Tip of PICC line is  seen in the superior vena cava close to right atrium. IMPRESSION: Cardiomegaly. CHF. Moderate to large right pleural effusion. Possibility of underlying infiltrates in the right lower lung fields is not excluded. There is interval improvement in aeration of left lower lung fields suggesting decrease in the left pleural effusion. Electronically Signed   By: Elmer Picker M.D.   On: 02/06/2021 10:57   ECHOCARDIOGRAM COMPLETE  Result Date: 02/05/2021    ECHOCARDIOGRAM REPORT   Patient Name:   Mandy [name] Date of Exam: 02/05/2021 Medical Rec #:  [id number]      Height:       59.0 in Accession #:    [id number]     Weight:       162.7 lb Date of Birth:  [birth year]       BSA:          1.689 m Patient Age:    81 years       BP:           188/80 mmHg Patient Gender: F              HR:           65 bpm. Exam Location:  Inpatient Procedure: 2D Echo Indications:    Pulmonary hypertension  History:        Patient has prior history of Echocardiogram examinations, most                 recent 12/15/2020. CHF, Pulmonary HTN; Risk Factors:Diabetes.  Sonographer:    Jefferey Pica Referring Phys: [place]  1. Severe pulmonary hypertension estimated PA systolic 425 mmHg.  2. Left ventricular ejection fraction, by estimation, is 60 to 65%. The left ventricle has normal function. The left ventricle has no regional wall motion abnormalities. There is severe left ventricular hypertrophy. Left ventricular diastolic parameters  are consistent with Grade I diastolic dysfunction (impaired relaxation).  3. Right ventricular systolic function is moderately reduced. The right ventricular size is moderately enlarged. There is severely elevated pulmonary artery systolic pressure.  4. The pericardial effusion is posterior to the left ventricle.  5. The mitral valve is abnormal. Trivial mitral valve regurgitation. No evidence of mitral stenosis.  6. Tricuspid valve regurgitation is severe.  7. The aortic valve is  tricuspid. There is mild calcification of the aortic  valve. Aortic valve regurgitation is not visualized. Aortic valve sclerosis is present, with no evidence of aortic valve stenosis.  8. The inferior vena cava is dilated in size with <50% respiratory variability, suggesting right atrial pressure of 15 mmHg. FINDINGS  Left Ventricle: Left ventricular ejection fraction, by estimation, is 60 to 65%. The left ventricle has normal function. The left ventricle has no regional wall motion abnormalities. The left ventricular internal cavity size was normal in size. There is  severe left ventricular hypertrophy. Left ventricular diastolic parameters are consistent with Grade I diastolic dysfunction (impaired relaxation). Right Ventricle: The right ventricular size is moderately enlarged. No increase in right ventricular wall thickness. Right ventricular systolic function is moderately reduced. There is severely elevated pulmonary artery systolic pressure. The tricuspid regurgitant velocity is 4.61 m/s, and with an assumed right atrial pressure of 15 mmHg, the estimated right ventricular systolic pressure is 740.8 mmHg. Left Atrium: Left atrial size was normal in size. Right Atrium: Right atrial size was normal in size. Pericardium: Trivial pericardial effusion is present. The pericardial effusion is posterior to the left ventricle. Mitral Valve: The mitral valve is abnormal. There is mild thickening of the mitral valve leaflet(s). There is mild calcification of the mitral valve leaflet(s). Trivial mitral valve regurgitation. No evidence of mitral valve stenosis. Tricuspid Valve: The tricuspid valve is normal in structure. Tricuspid valve regurgitation is severe. No evidence of tricuspid stenosis. Aortic Valve: The aortic valve is tricuspid. There is mild calcification of the aortic valve. Aortic valve regurgitation is not visualized. Aortic valve sclerosis is present, with no evidence of aortic valve stenosis. Aortic valve  peak gradient measures 10.5 mmHg. Pulmonic Valve: The pulmonic valve was normal in structure. Pulmonic valve regurgitation is mild. No evidence of pulmonic stenosis. Aorta: The aortic root is normal in size and structure. Venous: The inferior vena cava is dilated in size with less than 50% respiratory variability, suggesting right atrial pressure of 15 mmHg. IAS/Shunts: No atrial level shunt detected by color flow Doppler. Additional Comments: Severe pulmonary hypertension estimated PA systolic 144 mmHg. There is a small pleural effusion in the left lateral region.  LEFT VENTRICLE PLAX 2D LVIDd:         4.20 cm   Diastology LVIDs:         2.30 cm   LV e' medial:    4.11 cm/s LV PW:         1.60 cm   LV E/e' medial:  13.6 LV IVS:        1.70 cm   LV e' lateral:   5.18 cm/s LVOT diam:     2.00 cm   LV E/e' lateral: 10.8 LV SV:         90 LV SV Index:   53 LVOT Area:     3.14 cm  RIGHT VENTRICLE            IVC RV Basal diam:  3.30 cm    IVC diam: 2.60 cm RV Mid diam:    3.60 cm RV S prime:     8.05 cm/s TAPSE (M-mode): 1.8 cm LEFT ATRIUM             Index        RIGHT ATRIUM           Index LA diam:        3.50 cm 2.07 cm/m   RA Area:     21.30 cm LA Vol (A2C):   67.5 ml 39.95 ml/m  RA Volume:   65.90 ml  39.01 ml/m LA Vol (A4C):   37.8 ml 22.37 ml/m LA Biplane Vol: 51.3 ml 30.36 ml/m  AORTIC VALVE                 PULMONIC VALVE AV Area (Vmax): 2.31 cm     PV Vmax:       0.75 m/s AV Vmax:        162.00 cm/s  PV Peak grad:  2.2 mmHg AV Peak Grad:   10.5 mmHg LVOT Vmax:      119.00 cm/s LVOT Vmean:     66.500 cm/s LVOT VTI:       0.285 m  AORTA Ao Root diam: 3.40 cm Ao Asc diam:  3.60 cm MITRAL VALVE                TRICUSPID VALVE MV Area (PHT): 3.60 cm     TR Peak grad:   85.0 mmHg MV Decel Time: 211 msec     TR Vmax:        461.00 cm/s MV E velocity: 55.70 cm/s MV A velocity: 100.00 cm/s  SHUNTS MV E/A ratio:  0.56         Systemic VTI:  0.29 m                             Systemic Diam: 2.00 cm Jenkins Rouge MD  Electronically signed by Jenkins Rouge MD Signature Date/Time: 02/05/2021/12:04:34 PM    Final    [place] EKG SITE RITE  Result Date: 02/05/2021 If Site Rite image not attached, placement could not be confirmed due to current cardiac rhythm.   Cardiac Studies   2d echo 02/05/21 EF 69065% moderate RV failure estimated PA systolic 381 mmHg severe TR   Patient Profile     81 y.o. 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 Has hx of prior Covid PNA with hypoxemia in 02/2019 as well as 06/2020. 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 Between 09/2020 and now has had multiple readmissions for recurrent a/c respiratory failure and pleural effusions. Admitted with same 02/03/21, transported from sleep center with hypoxia.  Assessment & Plan    1. Recurrent acute on chronic hypoxic respiratory failure and recurrent right pleural effusion with acute on chronic diastolic CHF and severe pulmonary HTN - nice response to lasix drip and milrinone  -Negative 1.5 Liters breathing better - severe pulmonary HTN Vasodilatory effect of milrinone helping But not a long term solution  -Appreciated Dr Jeffie Pollock input - Coox 69.9 today  - wean milrinone to 0.125 ug  - start oral revatio tid for vasodilatation   2. Essential HTN - BP acceptable, manage in context above  3. AKI on CKD stage 3a with hyperkalemia - Cr 1.8 this am follow as inotrope weaned   4. H/o cryptogenic stroke 12/2020 as well as question of prior renal infarct 2020  - continue Plavix - need to consider loop recorder when more stable (pt/family wanted to hold off earlier this month)  5. Nonobstructive CAD by cath  11/2020 - minimally elevated troponin likely demand process  6. Thyroid disease - TSH normal 1.75   7. Chronic  appearing anemia, recent range 7-9 - stable 8.0 this am   Continue milrinone and lasix drip over weekend Long term prognosis remains guarded   For questions or updates, please contact [place] Please consult [email or web address] for contact info under Cardiology/STEMI.  Signed, Jenkins Rouge, MD 02/07/2021, 10:00 AM

## 2021-02-07 NOTE — Progress Notes (Signed)
Progress Note  Patient Name: Mandy [name] Date of Encounter: 02/08/2021  [facility] Cardiologist: Donato Heinz, MD   Subjective   Feels fullness in her throat this morning. Denies chest pain. Has been weaned off of milrinone and transitioned to sildenafil.  Co-ox 69.9 this AM  Inpatient Medications    Scheduled Meds:  arformoterol  15 mcg Nebulization BID   atenolol  50 mg Oral BID   atorvastatin  80 mg Oral QPM   chlorhexidine  15 mL Mouth Rinse BID   Chlorhexidine Gluconate Cloth  6 each Topical Daily   clopidogrel  75 mg Oral Daily   heparin  5,000 Units Subcutaneous Q8H   hydrALAZINE  75 mg Oral TID   mouth rinse  15 mL Mouth Rinse BID   melatonin  10 mg Oral QHS   methimazole  2.5 mg Oral Daily   pantoprazole  40 mg Oral q morning   revefenacin  175 mcg Nebulization Daily   sildenafil  20 mg Oral TID   sodium chloride flush  10-40 mL Intracatheter Q12H   sodium chloride flush  3 mL Intravenous Q12H   Continuous Infusions:  furosemide (LASIX) 200 mg in dextrose 5% 100 mL (2mg /mL) infusion 4 mg/hr (02/08/21 1508)   PRN Meds: acetaminophen **OR** acetaminophen, albuterol, alum & mag hydroxide-simeth, fentaNYL (SUBLIMAZE) injection, hydrALAZINE, ondansetron (ZOFRAN) IV, sodium chloride flush   Vital Signs    Vitals:   02/08/21 0839 02/08/21 0915 02/08/21 0916 02/08/21 1132  BP:  (!) 174/62 (!) 174/62   Pulse:   96   Resp:   (!) 21   Temp:    (!) 97.5 F (36.4 C)  TempSrc:    Oral  SpO2: 91%     Weight:      Height:        Intake/Output Summary (Last 24 hours) at 02/08/2021 1516 Last data filed at 02/08/2021 1508 Gross per 24 hour  Intake 229.98 ml  Output 900 ml  Net -670.02 ml   Last 3 Weights 02/08/2021 02/07/2021 02/06/2021  Weight (lbs) 160 lb 0.9 oz 162 lb 14.7 oz 163 lb 2.3 oz  Weight (kg) 72.6 kg 73.9 kg 74 kg      Telemetry    NSR with rare PVCs - Personally Reviewed  ECG    No new tracing - Personally Reviewed  Physical Exam    GEN: Elderly female, NAD   Neck: Prominent V-waves due to severe TR Cardiac: RRR, 2/6 systolic murmur Respiratory: Diminished throughout GI: Soft, nontender, non-distended  MS: No edema; No deformity. Neuro:  Nonfocal  Psych: Normal affect   Labs    High Sensitivity Troponin:   Recent Labs  Lab 02/03/21 0316 02/05/21 0933 02/05/21 1746 02/06/21 0854 02/06/21 1300  TROPONINIHS 59* 66* 65* 92* 85*     Chemistry Recent Labs  Lab 02/06/21 0918 02/06/21 1300 02/06/21 1600 02/07/21 0500 02/08/21 0855  NA 137  --  133* 136  --   K 2.8*  --  4.2 3.1*  --   CL 81*  --  82* 80*  --   CO2 47*  --  45* >45*  --   GLUCOSE 148*  --  190* 136*  --   BUN 39*  --  42* 40*  --   CREATININE 1.70*  --  1.81* 1.80*  --   CALCIUM 8.9  --  8.8* 8.9  --   MG  --    < > 2.4 2.3 2.6*  PROT 6.6  --   --   --   --  ALBUMIN 3.4*  --   --   --   --   AST 23  --   --   --   --   ALT 26  --   --   --   --   ALKPHOS 58  --   --   --   --   BILITOT 0.5  --   --   --   --   GFRNONAA 30*  --  28* 28*  --   ANIONGAP 9  --  6 NOT CALCULATED  --    < > = values in this interval not displayed.    Lipids  Recent Labs  Lab 02/05/21 0243  CHOL 165  TRIG 57  HDL 59  LDLCALC 95  CHOLHDL 2.8    Hematology Recent Labs  Lab 02/06/21 0918 02/07/21 0500 02/08/21 0855  WBC 5.9 6.1 7.4  RBC 3.20* 3.14* 3.37*  HGB 8.2* 8.0* 8.6*  HCT 26.7* 25.8* 28.4*  MCV 83.4 82.2 84.3  MCH 25.6* 25.5* 25.5*  MCHC 30.7 31.0 30.3  RDW 15.9* 15.6* 15.9*  PLT 226 228 243   Thyroid  Recent Labs  Lab 02/05/21 0243  TSH 1.753    BNP Recent Labs  Lab 02/03/21 0007 02/05/21 0933 02/06/21 0918  BNP 670.8* 1,051.4* 469.6*    DDimer No results for input(s): DDIMER in the last 168 hours.   Radiology    No results found.  Cardiac Studies   TTE 02/06/20: IMPRESSIONS     1. Severe pulmonary hypertension estimated PA systolic 115 mmHg.   2. Left ventricular ejection fraction, by estimation, is  60 to 65%. The  left ventricle has normal function. The left ventricle has no regional  wall motion abnormalities. There is severe left ventricular hypertrophy.  Left ventricular diastolic parameters   are consistent with Grade I diastolic dysfunction (impaired relaxation).   3. Right ventricular systolic function is moderately reduced. The right  ventricular size is moderately enlarged. There is severely elevated  pulmonary artery systolic pressure.   4. The pericardial effusion is posterior to the left ventricle.   5. The mitral valve is abnormal. Trivial mitral valve regurgitation. No  evidence of mitral stenosis.   6. Tricuspid valve regurgitation is severe.   7. The aortic valve is tricuspid. There is mild calcification of the  aortic valve. Aortic valve regurgitation is not visualized. Aortic valve  sclerosis is present, with no evidence of aortic valve stenosis.   8. The inferior vena cava is dilated in size with <50% respiratory  variability, suggesting right atrial pressure of 15 mmHg.   Patient Profile     81 y.o. 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 Has hx of prior Covid PNA with hypoxemia in 02/2019 as well as 06/2020. 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 Between 09/2020 and now has had multiple readmissions for recurrent a/c respiratory failure and pleural effusions. Admitted with same 02/03/21, transported from sleep center with hypoxia.    Assessment & Plan    #Acute on Chronic Hypoxic/Hypercapneic Respiratory Failure #Acute on Chronic Diastolic HF: #Severe Pulmonary HTN: #Bilateral Pleural Effusions: Patient presented after  profound hypoxic event with SpO2 in 30s during sleep for about 69min. CXR  notable for bilateral pleural effusions. BNP 670. Underwent thoracentesis and has been placed on inotropic support to aide in diuresis with improvement. TTE on this admission with EF 60-65%, G1DD, severe pulmonary HTN with PASP 163mmHg. Was initially on milrinone for intropic support to aide in diuresis now transitioned to sildenafil. Overall, poor prognosis.  -Transitioned off milrinone and now on sildenafil 20mg  TID -Continue lasix gtt at 4mg /hr -Monitor I/Os and daily weights -Overall prognosis is poor; remains full code per discussions with palliative  #HTN: -Continue hydralazine 75mg  TID; can decrease to allow room for sildenafil if needed -Continue atenolol 50mg  BID  #AKI on CKD 3A: -Stable -Monitor with diuresis  #History of cryptogenic stroke: -Continue plavix 75mg  daily -Continue lipitor 80mg  daily  #Nonobstructive CAD: -Continue lipitor 80mg  daily  #Chronic Anemia: -Stable; trend      For questions or updates, please contact [facility] Please consult [email or web address] for contact info under        Signed, Freada Bergeron, MD  02/08/2021, 3:16 PM

## 2021-02-08 ENCOUNTER — Other Ambulatory Visit: Payer: Medicare HMO

## 2021-02-08 DIAGNOSIS — J9621 Acute and chronic respiratory failure with hypoxia: Secondary | ICD-10-CM | POA: Diagnosis not present

## 2021-02-08 DIAGNOSIS — J9622 Acute and chronic respiratory failure with hypercapnia: Secondary | ICD-10-CM | POA: Diagnosis not present

## 2021-02-08 DIAGNOSIS — I5033 Acute on chronic diastolic (congestive) heart failure: Secondary | ICD-10-CM | POA: Diagnosis not present

## 2021-02-08 DIAGNOSIS — I272 Pulmonary hypertension, unspecified: Secondary | ICD-10-CM | POA: Diagnosis not present

## 2021-02-08 LAB — CBC
HCT: 28.4 % — ABNORMAL LOW (ref 36.0–46.0)
Hemoglobin: 8.6 g/dL — ABNORMAL LOW (ref 12.0–15.0)
MCH: 25.5 pg — ABNORMAL LOW (ref 26.0–34.0)
MCHC: 30.3 g/dL (ref 30.0–36.0)
MCV: 84.3 fL (ref 80.0–100.0)
Platelets: 243 10*3/uL (ref 150–400)
RBC: 3.37 MIL/uL — ABNORMAL LOW (ref 3.87–5.11)
RDW: 15.9 % — ABNORMAL HIGH (ref 11.5–15.5)
WBC: 7.4 10*3/uL (ref 4.0–10.5)
nRBC: 0 % (ref 0.0–0.2)

## 2021-02-08 LAB — MAGNESIUM: Magnesium: 2.6 mg/dL — ABNORMAL HIGH (ref 1.7–2.4)

## 2021-02-08 LAB — IMMUNOFIXATION, URINE

## 2021-02-08 LAB — ANA W/REFLEX IF POSITIVE: Anti Nuclear Antibody (ANA): NEGATIVE

## 2021-02-08 MED ORDER — ONDANSETRON HCL 4 MG/2ML IJ SOLN
4.0000 mg | Freq: Three times a day (TID) | INTRAMUSCULAR | Status: DC | PRN
  Administered 2021-02-08: 4 mg via INTRAVENOUS
  Filled 2021-02-08: qty 2

## 2021-02-08 MED ORDER — SILDENAFIL CITRATE 20 MG PO TABS
20.0000 mg | ORAL_TABLET | Freq: Three times a day (TID) | ORAL | Status: DC
  Administered 2021-02-08 – 2021-02-22 (×44): 20 mg via ORAL
  Filled 2021-02-08 (×48): qty 1

## 2021-02-08 MED ORDER — TADALAFIL 20 MG PO TABS: 20.0000 mg | ORAL_TABLET | Freq: Every day | ORAL | Status: DC

## 2021-02-08 NOTE — Progress Notes (Signed)
Noted patients SB/P IS TRENDING UPWARDS WITH NOTED SB/P IN LOW 100'S WITH DOCUMENTED MAPS IN 60'S TO 70'S, ON GOING ASSESSMENT

## 2021-02-08 NOTE — Progress Notes (Signed)
Noted post HS med pass ( which included anti-hypertensive meds and sleep aides) noted rapid decline in patients SB/P and MAP. Text page placed to on call and awaiting response. On going assessment reveals no change in patients cognition and BI-PAP PLACED. Continuing to assess and monitor.

## 2021-02-08 NOTE — Plan of Care (Signed)
Review of plan of care and implementing care plan to delivery of care. On going assessment

## 2021-02-08 NOTE — Progress Notes (Signed)
NAME:  Mandy [name], MRN:  [id number], DOB:  03-May-1940, LOS: 5 ADMISSION DATE:  02/02/2021, CONSULTATION DATE:  02/05/21 REFERRING MD:  Dwyane Dee CHIEF COMPLAINT:  Hypercarbia   History of Present Illness:  Mandy [name] is a 81 y.o. F with PMH significant for severe pulmonary HTN, HTN, HL, DM, recent pleural effusions, goiter and thyroid disease and HFpEF who presented to the ED 2/1 after desaturating during  a sleep study to 30% for about 10 minutes.  She had increased pleural effusions on CXR and was requiring 4L O2, so admitted to telemetry and treated with IV Lasix.   On 2/2 she was increasingly dyspneic and ABG showed pH 7.3 with pCO2 of 77 and PCCM was consulted.    Pertinent  Medical History  Diabetes Mellitus GERD CKD IIIa Hypertension Hyperthyroidism Mild CAD Pulmonary Hypertension Stroke  Significant Hospital Events: Including procedures, antibiotic start and stop dates in addition to other pertinent events   2/1 admit to hospitalists 2/2 PCCM consult for hypercarbia 2/3 Patient had loss of consciousness with O2 desaturation with movement 2/5 milrinone dose titrated down, tolerated well 2/6 milrinone off, started sildenafil  Interim History / Subjective:   Tolerated bipap overnight and has been on 6L O2 when off bipap  No chest pain or stomach burning this morning. She is feeling better overall. Spoke with patient's daughter who helped translate.  Lasix drip lowered to 4mg /hr, net even over past 24 hours  Objective   Blood pressure 119/60, pulse 91, temperature 97.7 F (36.5 C), temperature source Axillary, resp. rate 17, height 4\' 11"  (1.499 m), weight 72.6 kg, SpO2 98 %. CVP:  [13 mmHg-17 mmHg] 15 mmHg      Intake/Output Summary (Last 24 hours) at 02/08/2021 0746 Last data filed at 02/08/2021 0540 Gross per 24 hour  Intake 655.43 ml  Output 800 ml  Net -144.57 ml   Filed Weights   02/06/21 0500 02/07/21 0500 02/08/21 0500  Weight: 74 kg 73.9 kg 72.6 kg    Examination: General: elderly woman, no acute distress, nasal canula in place HENT: [place]/AT, sclera anicteric, moist mucous membranes Lungs: diminished breath sounds, non-labored, no wheezing Cardiovascular: rrr, s1s2, no murmurs Abdomen: soft, non-tender, non-distended, bowel sounds present Extremities: no edema, warm. Right PICC line in place. Neuro: alert, moving all extremities  Bedside US 2/6: Large, simple appearing right pleural effusion  Resolved Hospital Problem list     Assessment & Plan:  Acute on Chronic Hypoxemic and hypercapnic Respiratory Failure Acute on Chronic Diastolic Feart Failure Pulmonary Hypertension Bilateral Pleural Effusions, transudate based on pleural fluid studies 11/25/20 - Continue PRN bipap and qHS - Continue lasix gtt and titrate off milrinone  - start sildenafil 20mg  TID, unable to use tadalafil due to renal function - HIV screen negative - f/u ANA, Anti-histone ab - Continue LABA/LAMA nebulizer treatments via brovana + yupelri - Large right pleural effusion on bedside US 2/6. Simple appearing. Will consider therapeutic thoracentesis once patient more stable and able to sit up on her own.  Chest Pain Nonobstructive CAD - cath 11/2020 - Troponins not elevated, EKG non-ischemic  AKI on CKD IIIa Hypokalemia Hypochloremia - monitor renal function and electrolytes - replete potassium  Hyperthyroidism - on methimazole - TSH wnl  Anemia, Chronic normocytic - monitor  Nutrition - will need to consider cortrak placement for nutritional needs  Best Practice (right click and "Reselect all SmartList Selections" daily)   Diet/type: Regular consistency (see orders) DVT prophylaxis: prophylactic heparin  GI prophylaxis: N/A Lines: Central  line - PICC Foley:  N/A Code Status:  full code Last date of multidisciplinary goals of care discussion [palliative care consulted ]  Labs   CBC: Recent Labs  Lab 02/03/21 0532 02/04/21 0703  02/05/21 0243 02/06/21 0918 02/07/21 0500  WBC 6.6 6.5 6.6 5.9 6.1  NEUTROABS  --   --   --  4.2  --   HGB 8.7* 8.4* 8.9* 8.2* 8.0*  HCT 28.5* 28.1* 29.3* 26.7* 25.8*  MCV 84.6 85.9 84.2 83.4 82.2  PLT 237 222 242 226 366    Basic Metabolic Panel: Recent Labs  Lab 02/04/21 0703 02/04/21 1553 02/05/21 0243 02/06/21 0918 02/06/21 1300 02/06/21 1600 02/07/21 0500  NA 137 138 138 137  --  133* 136  K 4.0 3.8 3.7 2.8*  --  4.2 3.1*  CL 86* 88* 87* 81*  --  82* 80*  CO2 43* 43* 43* 47*  --  45* >45*  GLUCOSE 112* 107* 133* 148*  --  190* 136*  BUN 45* 44* 40* 39*  --  42* 40*  CREATININE 1.50* 1.55* 1.48* 1.70*  --  1.81* 1.80*  CALCIUM 8.8* 8.9 8.9 8.9  --  8.8* 8.9  MG 2.6*  --  2.7*  --  2.5* 2.4 2.3  PHOS  --   --  4.1  --   --   --   --    GFR: Estimated Creatinine Clearance: 22 mL/min (A) (by C-G formula based on SCr of 1.8 mg/dL (H)). Recent Labs  Lab 02/04/21 0703 02/05/21 0243 02/06/21 0918 02/07/21 0500  WBC 6.5 6.6 5.9 6.1    Liver Function Tests: Recent Labs  Lab 02/06/21 0918  AST 23  ALT 26  ALKPHOS 58  BILITOT 0.5  PROT 6.6  ALBUMIN 3.4*   No results for input(s): LIPASE, AMYLASE in the last 168 hours. No results for input(s): AMMONIA in the last 168 hours.  ABG    Component Value Date/Time   PHART 7.421 02/06/2021 1210   PCO2ART 76.7 (HH) 02/06/2021 1210   PO2ART 124 (H) 02/06/2021 1210   HCO3 49.0 (H) 02/06/2021 1210   TCO2 47 (H) 11/30/2020 1632   O2SAT 69.9 02/07/2021 0500     Coagulation Profile: No results for input(s): INR, PROTIME in the last 168 hours.  Cardiac Enzymes: No results for input(s): CKTOTAL, CKMB, CKMBINDEX, TROPONINI in the last 168 hours.  HbA1C: Hgb A1c MFr Bld  Date/Time Value Ref Range Status  12/07/2020 01:44 AM 5.7 (H) 4.8 - 5.6 % Final    Comment:    (NOTE) Pre diabetes:          5.7%-6.4%  Diabetes:              >6.4%  Glycemic control for   <7.0% adults with diabetes   11/03/2020 06:43 PM  5.9 (H) 4.8 - 5.6 % Final    Comment:    (NOTE) Pre diabetes:          5.7%-6.4%  Diabetes:              >6.4%  Glycemic control for   <7.0% adults with diabetes     CBG: No results for input(s): GLUCAP in the last 168 hours.   Critical care time: 33 minutes    Freda Jackson, MD [facility] Office: [phone]   See Amion for personal pager PCCM on call pager [phone] until 7pm. Please call [facility] 7p-7a. [phone]

## 2021-02-09 DIAGNOSIS — J9622 Acute and chronic respiratory failure with hypercapnia: Secondary | ICD-10-CM | POA: Diagnosis not present

## 2021-02-09 DIAGNOSIS — I272 Pulmonary hypertension, unspecified: Secondary | ICD-10-CM | POA: Diagnosis not present

## 2021-02-09 DIAGNOSIS — I509 Heart failure, unspecified: Secondary | ICD-10-CM | POA: Diagnosis not present

## 2021-02-09 DIAGNOSIS — J9621 Acute and chronic respiratory failure with hypoxia: Secondary | ICD-10-CM | POA: Diagnosis not present

## 2021-02-09 LAB — COMPREHENSIVE METABOLIC PANEL
ALT: 19 U/L (ref 0–44)
AST: 32 U/L (ref 15–41)
Albumin: 3.2 g/dL — ABNORMAL LOW (ref 3.5–5.0)
Alkaline Phosphatase: 47 U/L (ref 38–126)
BUN: 67 mg/dL — ABNORMAL HIGH (ref 8–23)
CO2: >45 mmol/L — ABNORMAL HIGH (ref 22–32)
Calcium: 8.7 mg/dL — ABNORMAL LOW (ref 8.9–10.3)
Chloride: 80 mmol/L — ABNORMAL LOW (ref 98–111)
Creatinine, Ser: 3.39 mg/dL — ABNORMAL HIGH (ref 0.44–1.00)
GFR, Estimated: 13 mL/min — ABNORMAL LOW (ref >60)
Glucose, Bld: 138 mg/dL — ABNORMAL HIGH (ref 70–99)
Potassium: 4 mmol/L (ref 3.5–5.1)
Sodium: 134 mmol/L — ABNORMAL LOW (ref 135–145)
Total Bilirubin: 0.3 mg/dL (ref 0.3–1.2)
Total Protein: 6.3 g/dL — ABNORMAL LOW (ref 6.5–8.1)

## 2021-02-09 LAB — BASIC METABOLIC PANEL
Anion gap: 9 (ref 5–15)
BUN: 63 mg/dL — ABNORMAL HIGH (ref 8–23)
CO2: 44 mmol/L — ABNORMAL HIGH (ref 22–32)
Calcium: 8.5 mg/dL — ABNORMAL LOW (ref 8.9–10.3)
Chloride: 81 mmol/L — ABNORMAL LOW (ref 98–111)
Creatinine, Ser: 3.7 mg/dL — ABNORMAL HIGH (ref 0.44–1.00)
GFR, Estimated: 12 mL/min — ABNORMAL LOW (ref >60)
Glucose, Bld: 122 mg/dL — ABNORMAL HIGH (ref 70–99)
Potassium: 4.4 mmol/L (ref 3.5–5.1)
Sodium: 134 mmol/L — ABNORMAL LOW (ref 135–145)

## 2021-02-09 LAB — COOXEMETRY PANEL
Carboxyhemoglobin: 1.5 % (ref 0.5–1.5)
Methemoglobin: 1.5 % (ref 0.0–1.5)
O2 Saturation: 72.5 %
Total hemoglobin: 7.6 g/dL — ABNORMAL LOW (ref 12.0–16.0)

## 2021-02-09 LAB — CBC
HCT: 24.5 % — ABNORMAL LOW (ref 36.0–46.0)
Hemoglobin: 7.6 g/dL — ABNORMAL LOW (ref 12.0–15.0)
MCH: 25.4 pg — ABNORMAL LOW (ref 26.0–34.0)
MCHC: 31 g/dL (ref 30.0–36.0)
MCV: 81.9 fL (ref 80.0–100.0)
Platelets: 218 10*3/uL (ref 150–400)
RBC: 2.99 MIL/uL — ABNORMAL LOW (ref 3.87–5.11)
RDW: 15.5 % (ref 11.5–15.5)
WBC: 6.8 10*3/uL (ref 4.0–10.5)
nRBC: 0 % (ref 0.0–0.2)

## 2021-02-09 LAB — MAGNESIUM: Magnesium: 2.7 mg/dL — ABNORMAL HIGH (ref 1.7–2.4)

## 2021-02-09 LAB — MRSA NEXT GEN BY PCR, NASAL: MRSA by PCR Next Gen: NOT DETECTED

## 2021-02-09 LAB — HISTONE ANTIBODIES, IGG, BLOOD: DNA-Histone: 0.7 Units (ref 0.0–0.9)

## 2021-02-09 MED ORDER — FUROSEMIDE 10 MG/ML IJ SOLN
40.0000 mg | Freq: Two times a day (BID) | INTRAMUSCULAR | Status: DC
  Administered 2021-02-09 – 2021-02-12 (×6): 40 mg via INTRAVENOUS
  Filled 2021-02-09 (×6): qty 4

## 2021-02-09 MED ORDER — SODIUM CHLORIDE 0.9 % IV BOLUS
250.0000 mL | Freq: Once | INTRAVENOUS | Status: AC
  Administered 2021-02-09: 250 mL via INTRAVENOUS

## 2021-02-09 NOTE — TOC Progression Note (Signed)
Transition of Care [facility]) - Progression Note    Patient Details  Name: Mandy [name] MRN: [id number] Date of Birth: [birth year]  Transition [facility]) CM/SW Contact  Ross Ludwig, [place] Phone Number: 02/08/2021, 4:59 PM  Clinical Narrative:     CSW continuing to follow patient's progress throughout discharge planning.  CSW awaiting PT recommendations.   Expected Discharge Plan: Home/Self Care Barriers to Discharge: Continued Medical Work up  Expected Discharge Plan and Services Expected Discharge Plan: Home/Self Care   Discharge Planning Services: CM Consult Post Acute Care Choice: NA Living arrangements for the past 2 months: Single Family Home                                       Social Determinants of Health (SDOH) Interventions    Readmission Risk Interventions Readmission Risk Prevention Plan 02/05/2021 11/06/2020  Transportation Screening Complete Complete  PCP or Specialist Appt within 3-5 Days - Complete  HRI or [place] - Complete  Social Work Consult for [place] Planning/Counseling - Complete  Palliative Care Screening - Complete  Medication Review Press photographer) Complete Complete  PCP or Specialist appointment within 3-5 days of discharge Complete -  [place] or [place] Complete -  SW Recovery Care/Counseling Consult Complete -  Palliative Care Screening Complete -  [place] Not Applicable -  Some recent data might be hidden

## 2021-02-09 NOTE — Plan of Care (Signed)
°  Problem: Education: Goal: Knowledge of General Education information will improve Description: Including pain rating scale, medication(s)/side effects and non-pharmacologic comfort measures Outcome: Progressing   Problem: Health Behavior/Discharge Planning: Goal: Ability to manage health-related needs will improve Outcome: Progressing   Problem: Clinical Measurements: Goal: Ability to maintain clinical measurements within normal limits will improve Outcome: Progressing   Problem: Clinical Measurements: Goal: Will remain free from infection Outcome: Progressing   Problem: Nutrition: Goal: Adequate nutrition will be maintained Outcome: Progressing   Problem: Coping: Goal: Level of anxiety will decrease Outcome: Progressing   Problem: Elimination: Goal: Will not experience complications related to urinary retention Outcome: Progressing   Problem: Pain Managment: Goal: General experience of comfort will improve Outcome: Progressing

## 2021-02-09 NOTE — Progress Notes (Signed)
After multiple attempts, unable to get arterial sample.  Pt refuses any more sticks at this time. Order cancelled.

## 2021-02-09 NOTE — Progress Notes (Signed)
When attempting to get out of bed with physical therapy this morning, patient was very weak, drowsy, and unable to stand. O2 Sats dropped to 80% while on 3LNC. I increased the patient's oxygen liters and put her back to bed. Renae Gloss, RN 02/09/2021

## 2021-02-09 NOTE — Progress Notes (Signed)
Progress Note  Patient Name: Arlethia Basso Date of Encounter: 02/10/2021  [facility] Cardiologist: Donato Heinz, MD   Subjective   Somnolent on exam but was more awake this morning per nursing. Continues to complain of SOB.  Cr down-trending 3.3>2.9 Co-ox 75.9 pCO2 76 538mL UOP  Inpatient Medications    Scheduled Meds:  arformoterol  15 mcg Nebulization BID   atorvastatin  80 mg Oral QPM   Chlorhexidine Gluconate Cloth  6 each Topical Daily   clopidogrel  75 mg Oral Daily   furosemide  40 mg Intravenous Q12H   heparin  5,000 Units Subcutaneous Q8H   mouth rinse  15 mL Mouth Rinse BID   melatonin  10 mg Oral QHS   methimazole  2.5 mg Oral Daily   pantoprazole  40 mg Oral q morning   revefenacin  175 mcg Nebulization Daily   sildenafil  20 mg Oral TID   sodium chloride flush  10-40 mL Intracatheter Q12H   sodium chloride flush  3 mL Intravenous Q12H   Continuous Infusions:   PRN Meds: acetaminophen **OR** acetaminophen, albuterol, alum & mag hydroxide-simeth, hydrALAZINE, ondansetron (ZOFRAN) IV, sodium chloride flush   Vital Signs    Vitals:   02/10/21 0830 02/10/21 0841 02/10/21 0900 02/10/21 1000  BP:   (!) 132/51 (!) 141/61  Pulse:  72 69 70  Resp: 18 (!) 24 (!) 22 (!) 24  Temp:      TempSrc:      SpO2:  98% 100% 100%  Weight:      Height:        Intake/Output Summary (Last 24 hours) at 02/10/2021 1055 Last data filed at 02/10/2021 1000 Gross per 24 hour  Intake 137.42 ml  Output 550 ml  Net -412.58 ml   Last 3 Weights 02/10/2021 02/09/2021 02/08/2021  Weight (lbs) 163 lb 2.3 oz 160 lb 7.9 oz 160 lb 0.9 oz  Weight (kg) 74 kg 72.8 kg 72.6 kg      Telemetry    NSR - Personally Reviewed  ECG    No new tracing - Personally Reviewed  Physical Exam   GEN: Elderly female, somnolent but arousable  Neck: Prominent V-waves due to severe TR Cardiac: RRR, 2/6 systolic murmur Respiratory: Diminished throughout GI: Soft, nontender,  non-distended  MS: No edema; No deformity. Neuro:  Somnolent but opens eyes to voice; grossly nonfocal Psych: Normal affect   Labs    High Sensitivity Troponin:   Recent Labs  Lab 02/03/21 0316 02/05/21 0933 02/05/21 1746 02/06/21 0854 02/06/21 1300  TROPONINIHS 59* 66* 65* 92* 85*     Chemistry Recent Labs  Lab 02/06/21 0918 02/06/21 1300 02/08/21 0855 02/09/21 0302 02/09/21 0748 02/10/21 0636  NA 137   < >  --  134* 134* 133*  K 2.8*   < >  --  4.4 4.0 3.8  CL 81*   < >  --  81* 80* 81*  CO2 47*   < >  --  44* >45* >45*  GLUCOSE 148*   < >  --  122* 138* 122*  BUN 39*   < >  --  63* 67* 69*  CREATININE 1.70*   < >  --  3.70* 3.39* 2.90*  CALCIUM 8.9   < >  --  8.5* 8.7* 8.5*  MG  --    < > 2.6* 2.7*  --  2.8*  PROT 6.6  --   --   --  6.3*  --  ALBUMIN 3.4*  --   --   --  3.2*  --   AST 23  --   --   --  32  --   ALT 26  --   --   --  19  --   ALKPHOS 58  --   --   --  47  --   BILITOT 0.5  --   --   --  0.3  --   GFRNONAA 30*   < >  --  12* 13* 16*  ANIONGAP 9   < >  --  9 NOT CALCULATED NOT CALCULATED   < > = values in this interval not displayed.    Lipids  Recent Labs  Lab 02/05/21 0243  CHOL 165  TRIG 57  HDL 59  LDLCALC 95  CHOLHDL 2.8    Hematology Recent Labs  Lab 02/08/21 0855 02/09/21 0302 02/10/21 0636  WBC 7.4 6.8 5.5  RBC 3.37* 2.99* 3.01*  HGB 8.6* 7.6* 7.7*  HCT 28.4* 24.5* 25.1*  MCV 84.3 81.9 83.4  MCH 25.5* 25.4* 25.6*  MCHC 30.3 31.0 30.7  RDW 15.9* 15.5 15.7*  PLT 243 218 240   Thyroid  Recent Labs  Lab 02/05/21 0243  TSH 1.753    BNP Recent Labs  Lab 02/05/21 0933 02/06/21 0918  BNP 1,051.4* 469.6*    DDimer No results for input(s): DDIMER in the last 168 hours.   Radiology    No results found.  Cardiac Studies   TTE 02/06/20: IMPRESSIONS   1. Severe pulmonary hypertension estimated PA systolic 628 mmHg.   2. Left ventricular ejection fraction, by estimation, is 60 to 65%. The  left ventricle has  normal function. The left ventricle has no regional  wall motion abnormalities. There is severe left ventricular hypertrophy.  Left ventricular diastolic parameters   are consistent with Grade I diastolic dysfunction (impaired relaxation).   3. Right ventricular systolic function is moderately reduced. The right  ventricular size is moderately enlarged. There is severely elevated  pulmonary artery systolic pressure.   4. The pericardial effusion is posterior to the left ventricle.   5. The mitral valve is abnormal. Trivial mitral valve regurgitation. No  evidence of mitral stenosis.   6. Tricuspid valve regurgitation is severe.   7. The aortic valve is tricuspid. There is mild calcification of the  aortic valve. Aortic valve regurgitation is not visualized. Aortic valve  sclerosis is present, with no evidence of aortic valve stenosis.   8. The inferior vena cava is dilated in size with <50% respiratory  variability, suggesting right atrial pressure of 15 mmHg.   Patient Profile     81 y.o. 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 Has hx of prior Covid PNA with hypoxemia in 02/2019 as well as 06/2020. 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 Between 09/2020 and now has had multiple readmissions for recurrent a/c respiratory failure and pleural effusions. Admitted with same 02/03/21, transported from sleep center with hypoxia.    Assessment & Plan    #Acute on Chronic Hypoxic/Hypercapneic Respiratory Failure #Acute on Chronic Diastolic HF: #Severe Pulmonary HTN: #Bilateral Pleural Effusions: Patient presented after profound hypoxic event with SpO2 in 30s  during  sleep for about 34min. CXR notable for bilateral pleural effusions. BNP 670. Underwent thoracentesis and was placed on inotropic support to aide in diuresis with improvement. TTE on this admission with EF 60-65%, G1DD, severe pulmonary HTN with PASP 133mmHg. Now off milrinone and transitioned to sildenafil. Overall, poor prognosis but wishes to continue with full treatment as able per discussions with palliative -Continue sildenafil 20mg  TID -Off lasix gtt and transitioned to lasix 40mg  IV BID -BiPAP management per CCM -Possible plans for thoracentesis with CCM -Overall prognosis is very poor given severe pulmonary HTN; appreciate palliative input  #HTN: BP soft after initiation of sildenafil. -Atenolol stopped due to AKI -Holding hydralazine for now  #AKI on CKD 3A: Improving with stopping lasix gtt. Now transitioned to intermittent lasix dosing. Co-ox okay after stopping milrinone -Continue lasix 40mg  IV -Given IVF this AM  #History of cryptogenic stroke: -Continue plavix 75mg  daily -Continue lipitor 80mg  daily  #Nonobstructive CAD: -Continue lipitor 80mg  daily  #Chronic Anemia: -Stable; trend     For questions or updates, please contact [facility] Please consult [email or web address] for contact info under        Signed, Freada Bergeron, MD  02/10/2021, 10:55 AM

## 2021-02-09 NOTE — Progress Notes (Signed)
NAME:  Mandy [name], MRN:  [id number], DOB:  1940/02/28, LOS: 6 ADMISSION DATE:  02/02/2021, CONSULTATION DATE:  02/05/21 REFERRING MD:  Dwyane Dee CHIEF COMPLAINT:  Hypercarbia   History of Present Illness:  Mandy [name] is a 81 y.o. F with PMH significant for severe pulmonary HTN, HTN, HL, DM, recent pleural effusions, goiter and thyroid disease and HFpEF who presented to the ED 2/1 after desaturating during  a sleep study to 30% for about 10 minutes.  She had increased pleural effusions on CXR and was requiring 4L O2, so admitted to telemetry and treated with IV Lasix.   On 2/2 she was increasingly dyspneic and ABG showed pH 7.3 with pCO2 of 77 and PCCM was consulted.    Pertinent  Medical History  Diabetes Mellitus GERD CKD IIIa Hypertension Hyperthyroidism Mild CAD Pulmonary Hypertension Stroke  Significant Hospital Events: Including procedures, antibiotic start and stop dates in addition to other pertinent events   2/1 admit to hospitalists 2/2 PCCM consult for hypercarbia 2/3 Patient had loss of consciousness with O2 desaturation with movement 2/5 milrinone dose titrated down, tolerated well 2/6 milrinone off, started sildenafil  Interim History / Subjective:   Tolerated bipap overnight. On 2-3L [place] during the day.  No chest pain or stomach burning this morning. She is feeling better overall. Spoke with patient's daughter who helped translate.  Lasix drip lowered to 4mg /hr, net even over past 24 hours  Objective   Blood pressure (!) 134/53, pulse 89, temperature 97.8 F (36.6 C), temperature source Axillary, resp. rate 18, height 4\' 11"  (1.499 m), weight 72.8 kg, SpO2 94 %. CVP:  [12 mmHg-14 mmHg] 14 mmHg      Intake/Output Summary (Last 24 hours) at 02/09/2021 0749 Last data filed at 02/09/2021 0600 Gross per 24 hour  Intake 419.26 ml  Output 100 ml  Net 319.26 ml   Filed Weights   02/07/21 0500 02/08/21 0500 02/09/21 0536  Weight: 73.9 kg 72.6 kg 72.8 kg    Examination: General: elderly woman, no acute distress, nasal canula in place HENT: [place]/AT, sclera anicteric, moist mucous membranes Lungs: diminished breath sounds, non-labored, no wheezing Cardiovascular: rrr, s1s2, no murmurs Abdomen: soft, non-tender, non-distended, bowel sounds present Extremities: no edema, warm. Right PICC line in place. Neuro: alert, moving all extremities  Bedside US 2/6: Large, simple appearing right pleural effusion  Resolved Hospital Problem list     Assessment & Plan:  Acute on Chronic Hypoxemic and hypercapnic Respiratory Failure Acute on Chronic Diastolic Feart Failure Pulmonary Hypertension Bilateral Pleural Effusions, transudate based on pleural fluid studies 11/25/20 - Continue PRN bipap and qHS - Stop lasix gtt, transition to 40mg  lasix BID IV dosing  - continue sildenafil 20mg  TID, unable to use tadalafil due to renal function - HIV screen negative - ANA negative  - f/u Anti-histone ab - Continue LABA/LAMA nebulizer treatments via brovana + yupelri - Large right pleural effusion on bedside US 2/6. Simple appearing. Will consider therapeutic thoracentesis once patient more stable and able to sit up on her own.  Chest Pain Nonobstructive CAD - cath 11/2020 - Troponins not elevated, EKG non-ischemic - Continue lipitor 80mg  daily  AKI on CKD IIIa Hypokalemia Hypochloremia - Renal function worsening over past 24 hours, likely over diuresis. - Stop lasix drip - Give 23mL NS bolus this AM - monitor renal function and electrolytes - replete potassium  Hypertension - hold hydrlazine today - Continue atenolol  History of cryptogenic stroke: -Continue plavix 75mg  daily -Continue lipitor 80mg  daily  Hyperthyroidism - on methimazole - TSH wnl  Anemia, Chronic normocytic - monitor  Nutrition - will need to consider cortrak placement for nutritional needs  Best Practice (right click and "Reselect all SmartList Selections" daily)    Diet/type: Regular consistency (see orders) DVT prophylaxis: prophylactic heparin  GI prophylaxis: N/A Lines: Central line - PICC Foley:  N/A Code Status:  full code Last date of multidisciplinary goals of care discussion [facility] care consulted. Overall patient's prognosis is poor given her severe pulmonary hypertension and now decline in renal function on 2/7 ]  Labs   CBC: Recent Labs  Lab 02/05/21 0243 02/06/21 0918 02/07/21 0500 02/08/21 0855 02/09/21 0302  WBC 6.6 5.9 6.1 7.4 6.8  NEUTROABS  --  4.2  --   --   --   HGB 8.9* 8.2* 8.0* 8.6* 7.6*  HCT 29.3* 26.7* 25.8* 28.4* 24.5*  MCV 84.2 83.4 82.2 84.3 81.9  PLT 242 226 228 243 202    Basic Metabolic Panel: Recent Labs  Lab 02/05/21 0243 02/06/21 0918 02/06/21 1300 02/06/21 1600 02/07/21 0500 02/08/21 0855 02/09/21 0302  NA 138 137  --  133* 136  --  134*  K 3.7 2.8*  --  4.2 3.1*  --  4.4  CL 87* 81*  --  82* 80*  --  81*  CO2 43* 47*  --  45* >45*  --  44*  GLUCOSE 133* 148*  --  190* 136*  --  122*  BUN 40* 39*  --  42* 40*  --  63*  CREATININE 1.48* 1.70*  --  1.81* 1.80*  --  3.70*  CALCIUM 8.9 8.9  --  8.8* 8.9  --  8.5*  MG 2.7*  --  2.5* 2.4 2.3 2.6* 2.7*  PHOS 4.1  --   --   --   --   --   --    GFR: Estimated Creatinine Clearance: 10.7 mL/min (A) (by C-G formula based on SCr of 3.7 mg/dL (H)). Recent Labs  Lab 02/06/21 0918 02/07/21 0500 02/08/21 0855 02/09/21 0302  WBC 5.9 6.1 7.4 6.8    Liver Function Tests: Recent Labs  Lab 02/06/21 0918  AST 23  ALT 26  ALKPHOS 58  BILITOT 0.5  PROT 6.6  ALBUMIN 3.4*   No results for input(s): LIPASE, AMYLASE in the last 168 hours. No results for input(s): AMMONIA in the last 168 hours.  ABG    Component Value Date/Time   PHART 7.421 02/06/2021 1210   PCO2ART 76.7 (HH) 02/06/2021 1210   PO2ART 124 (H) 02/06/2021 1210   HCO3 49.0 (H) 02/06/2021 1210   TCO2 47 (H) 11/30/2020 1632   O2SAT 72.5 02/09/2021 0548     Coagulation  Profile: No results for input(s): INR, PROTIME in the last 168 hours.  Cardiac Enzymes: No results for input(s): CKTOTAL, CKMB, CKMBINDEX, TROPONINI in the last 168 hours.  HbA1C: Hgb A1c MFr Bld  Date/Time Value Ref Range Status  12/07/2020 01:44 AM 5.7 (H) 4.8 - 5.6 % Final    Comment:    (NOTE) Pre diabetes:          5.7%-6.4%  Diabetes:              >6.4%  Glycemic control for   <7.0% adults with diabetes   11/03/2020 06:43 PM 5.9 (H) 4.8 - 5.6 % Final    Comment:    (NOTE) Pre diabetes:          5.7%-6.4%  Diabetes:              >  6.4%  Glycemic control for   <7.0% adults with diabetes     CBG: No results for input(s): GLUCAP in the last 168 hours.   Critical care time: n/a    Freda Jackson, MD [facility] Office: [phone]   See Amion for personal pager PCCM on call pager [phone] until 7pm. Please call Elink 7p-7a. [phone]

## 2021-02-09 NOTE — Progress Notes (Signed)
Patient's daughter wanted [place] to try again with getting her into the chair. Patient was able to pivot to chair but O2 sats quickly dropped to 60%. Renae Gloss, RN

## 2021-02-09 NOTE — Progress Notes (Signed)
Progress Note  Patient Name: Mandy [name] Date of Encounter: 02/09/2021  [facility] Cardiologist: Donato Heinz, MD   Subjective   On HFNC overnight. Weaned to 3L [place] this AM.  Cr jumped from 1.8 to 3.4; co-ox okay however at 72.5 Wt stable at 160lbs  Somnolent this morning. Opens eyes to voice but falls back asleep. States breathing remains labored.  Inpatient Medications    Scheduled Meds:  arformoterol  15 mcg Nebulization BID   atenolol  50 mg Oral BID   atorvastatin  80 mg Oral QPM   chlorhexidine  15 mL Mouth Rinse BID   Chlorhexidine Gluconate Cloth  6 each Topical Daily   clopidogrel  75 mg Oral Daily   furosemide  40 mg Intravenous Q12H   heparin  5,000 Units Subcutaneous Q8H   mouth rinse  15 mL Mouth Rinse BID   melatonin  10 mg Oral QHS   methimazole  2.5 mg Oral Daily   pantoprazole  40 mg Oral q morning   revefenacin  175 mcg Nebulization Daily   sildenafil  20 mg Oral TID   sodium chloride flush  10-40 mL Intracatheter Q12H   sodium chloride flush  3 mL Intravenous Q12H   Continuous Infusions:   PRN Meds: acetaminophen **OR** acetaminophen, albuterol, alum & mag hydroxide-simeth, fentaNYL (SUBLIMAZE) injection, hydrALAZINE, ondansetron (ZOFRAN) IV, sodium chloride flush   Vital Signs    Vitals:   02/09/21 0536 02/09/21 0700 02/09/21 0800 02/09/21 0828  BP:  (!) 134/53 (!) 117/52   Pulse:  89 87   Resp:  18    Temp:    98.3 F (36.8 C)  TempSrc:    Axillary  SpO2:  94% 98%   Weight: 72.8 kg     Height:        Intake/Output Summary (Last 24 hours) at 02/09/2021 1004 Last data filed at 02/09/2021 0600 Gross per 24 hour  Intake 299.26 ml  Output --  Net 299.26 ml    Last 3 Weights 02/09/2021 02/08/2021 02/07/2021  Weight (lbs) 160 lb 7.9 oz 160 lb 0.9 oz 162 lb 14.7 oz  Weight (kg) 72.8 kg 72.6 kg 73.9 kg      Telemetry    NSR - Personally Reviewed  ECG    No new tracing today - Personally Reviewed  Physical Exam   GEN:  Elderly female, NAD   Neck: Prominent V-waves due to severe TR Cardiac: RRR, 2/6 systolic murmur Respiratory: Diminished throughout GI: Soft, nontender, non-distended  MS: No edema; No deformity. Neuro:  Somnolent but opens eyes to voice; grossly nonfocal Psych: Normal affect   Labs    High Sensitivity Troponin:   Recent Labs  Lab 02/03/21 0316 02/05/21 0933 02/05/21 1746 02/06/21 0854 02/06/21 1300  TROPONINIHS 59* 66* 65* 92* 85*      Chemistry Recent Labs  Lab 02/06/21 0918 02/06/21 1300 02/07/21 0500 02/08/21 0855 02/09/21 0302 02/09/21 0748  NA 137   < > 136  --  134* 134*  K 2.8*   < > 3.1*  --  4.4 4.0  CL 81*   < > 80*  --  81* 80*  CO2 47*   < > >45*  --  44* >45*  GLUCOSE 148*   < > 136*  --  122* 138*  BUN 39*   < > 40*  --  63* 67*  CREATININE 1.70*   < > 1.80*  --  3.70* 3.39*  CALCIUM 8.9   < >  8.9  --  8.5* 8.7*  MG  --    < > 2.3 2.6* 2.7*  --   PROT 6.6  --   --   --   --  6.3*  ALBUMIN 3.4*  --   --   --   --  3.2*  AST 23  --   --   --   --  32  ALT 26  --   --   --   --  19  ALKPHOS 58  --   --   --   --  47  BILITOT 0.5  --   --   --   --  0.3  GFRNONAA 30*   < > 28*  --  12* 13*  ANIONGAP 9   < > NOT CALCULATED  --  9 NOT CALCULATED   < > = values in this interval not displayed.     Lipids  Recent Labs  Lab 02/05/21 0243  CHOL 165  TRIG 57  HDL 59  LDLCALC 95  CHOLHDL 2.8     Hematology Recent Labs  Lab 02/07/21 0500 02/08/21 0855 02/09/21 0302  WBC 6.1 7.4 6.8  RBC 3.14* 3.37* 2.99*  HGB 8.0* 8.6* 7.6*  HCT 25.8* 28.4* 24.5*  MCV 82.2 84.3 81.9  MCH 25.5* 25.5* 25.4*  MCHC 31.0 30.3 31.0  RDW 15.6* 15.9* 15.5  PLT 228 243 218    Thyroid  Recent Labs  Lab 02/05/21 0243  TSH 1.753     BNP Recent Labs  Lab 02/03/21 0007 02/05/21 0933 02/06/21 0918  BNP 670.8* 1,051.4* 469.6*     DDimer No results for input(s): DDIMER in the last 168 hours.   Radiology    No results found.  Cardiac Studies   TTE  02/06/20: IMPRESSIONS     1. Severe pulmonary hypertension estimated PA systolic 500 mmHg.   2. Left ventricular ejection fraction, by estimation, is 60 to 65%. The  left ventricle has normal function. The left ventricle has no regional  wall motion abnormalities. There is severe left ventricular hypertrophy.  Left ventricular diastolic parameters   are consistent with Grade I diastolic dysfunction (impaired relaxation).   3. Right ventricular systolic function is moderately reduced. The right  ventricular size is moderately enlarged. There is severely elevated  pulmonary artery systolic pressure.   4. The pericardial effusion is posterior to the left ventricle.   5. The mitral valve is abnormal. Trivial mitral valve regurgitation. No  evidence of mitral stenosis.   6. Tricuspid valve regurgitation is severe.   7. The aortic valve is tricuspid. There is mild calcification of the  aortic valve. Aortic valve regurgitation is not visualized. Aortic valve  sclerosis is present, with no evidence of aortic valve stenosis.   8. The inferior vena cava is dilated in size with <50% respiratory  variability, suggesting right atrial pressure of 15 mmHg.   Patient Profile     81 y.o. 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 Has hx of prior Covid PNA with hypoxemia in 02/2019 as well as 06/2020. 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 Between 09/2020 and now has had multiple readmissions for recurrent a/c respiratory failure and pleural effusions.  Admitted with same 02/03/21, transported from sleep center with hypoxia.    Assessment & Plan    #Acute on Chronic  Hypoxic/Hypercapneic Respiratory Failure #Acute on Chronic Diastolic HF: #Severe Pulmonary HTN: #Bilateral Pleural Effusions: Patient presented after profound hypoxic event with SpO2 in 30s during sleep for about 47min. CXR notable for bilateral pleural effusions. BNP 670. Underwent thoracentesis and has been placed on inotropic support to aide in diuresis with improvement. TTE on this admission with EF 60-65%, G1DD, severe pulmonary HTN with PASP 156mmHg. Was initially on milrinone for intropic support to aide in diuresis now transitioned to sildenafil. Overall, poor prognosis.  -Transitioned off milrinone and now on sildenafil 20mg  TID -Cr increased from 1.8>3.3 but co-ox okay at 72.5 which suggests she is not low output/poor forward flow after stopping milrinone; ? Overdiuresis. Lasix gtt stopped and given IVF this AM -Transitioned to lasix 40mg  IV BID to start tonight; will monitor renal function  -Agree with BiPAP (? Retaining CO2 currently due to increased somnolence) -Overall prognosis is very poor given severe pulmonary HTN; appreciate palliative input  #HTN: -Hydralazine held this AM given soft BP this AM -Will hold atenolol due to signficant AKI -Likely plan to change to metop vs coreg going forward if needed  #AKI on CKD 3A: Cr significantly increased overnight from 1.8>3.3. Co-ox okay at 72.5 suggesting she is not low output or poor forward flow. Possibly due to overdiuresis. Lasix gtt held. -Lasix gtt held this AM; transitioned to IV lasix to start tonight -Given IVF this AM  #History of cryptogenic stroke: -Continue plavix 75mg  daily -Continue lipitor 80mg  daily  #Nonobstructive CAD: -Continue lipitor 80mg  daily  #Chronic Anemia: -Stable; trend      For questions or updates, please contact [facility] Please consult [email or web address] for contact info under        Signed, Freada Bergeron, MD  02/09/2021, 10:04 AM

## 2021-02-09 NOTE — Evaluation (Signed)
Physical Therapy Evaluation Patient Details Name: Mandy [name] MRN: [id number] DOB: 1940-12-03 Today's Date: 02/09/2021  History of Present Illness  81 yo female admitted with acute on chronic resp failure, acute on chronic HF, bil pleural effusion. Hx of anemia, severe pulm HTN, DM, HF, CVA, CAD, CKD. Pt is non English speaking-family interprets-hospital interpreter not available  Clinical Impression  On eval, pt required Mod A +2 for bed mobility. Sat EOB for at leat 5 minutes with Min A. Noted pt to have spontaneous muscle jerking and instability with sitting balance. She was also a bit lethargic. Deferred OOB and assisted back in to supine. Will plan to follow during hospital stay. No family present to discuss d/c plan this session.      Recommendations for follow up therapy are one component of a multi-disciplinary discharge planning process, led by the attending physician.  Recommendations may be updated based on patient status, additional functional criteria and insurance authorization.  Follow Up Recommendations Skilled nursing-short term rehab (<3 hours/day) (vs HHPT, depending on progress)    Assistance Recommended at Discharge Frequent or constant Supervision/Assistance  Patient can return home with the following  Assist for transportation;Help with stairs or ramp for entrance;Assistance with cooking/housework;A lot of help with walking and/or transfers;A lot of help with bathing/dressing/bathroom    Equipment Recommendations None recommended by PT  Recommendations for Other Services       Functional Status Assessment Patient has had a recent decline in their functional status and demonstrates the ability to make significant improvements in function in a reasonable and predictable amount of time.     Precautions / Restrictions Precautions Precautions: Fall Restrictions Weight Bearing Restrictions: No      Mobility  Bed Mobility Overal bed mobility: Needs Assistance Bed  Mobility: Supine to Sit     Supine to sit: Mod assist, +2 for physical assistance, +2 for safety/equipment, HOB elevated     General bed mobility comments: Assist for trunk and LEs. Increased time. Multimodal cueing. Sat EOB for at leat 5 minutes with Min A. Noted pt to have spontaneous muscle jerking and instability with sitting balance. She was also a bit lethargic. Deferred OOB and assisted back in to supine.    Transfers                   General transfer comment: NT on today    Ambulation/Gait                  Stairs            Wheelchair Mobility    Modified Rankin (Stroke Patients Only)       Balance Overall balance assessment: Needs assistance Sitting-balance support: Bilateral upper extremity supported, Feet supported Sitting balance-Leahy Scale: Poor                                       Pertinent Vitals/Pain Pain Assessment Pain Assessment: Faces Faces Pain Scale: No hurt    Home Living Family/patient expects to be discharged to:: Unsure Living Arrangements: Children Available Help at Discharge: Family;Available 24 hours/day Type of Home: House Home Access: Level entry       Home Layout: One level Home Equipment: Conservation officer, nature (2 wheels) Additional Comments: no family present-info taken from previous admission    Prior Function Prior Level of Function : Needs assist  Mobility Comments: family assists with mobility and ADL's       Hand Dominance   Dominant Hand: Right    Extremity/Trunk Assessment   Upper Extremity Assessment Upper Extremity Assessment: Defer to OT evaluation    Lower Extremity Assessment Lower Extremity Assessment: Generalized weakness    Cervical / Trunk Assessment Cervical / Trunk Assessment: Normal  Communication   Communication: Prefers language other than English  Cognition Arousal/Alertness: Lethargic Behavior During Therapy: Flat affect Overall Cognitive  Status: Difficult to assess                                          General Comments      Exercises     Assessment/Plan    PT Assessment Patient needs continued PT services  PT Problem List Decreased strength;Decreased mobility;Decreased activity tolerance;Decreased balance;Decreased knowledge of use of DME       PT Treatment Interventions DME instruction;Gait training;Therapeutic activities;Therapeutic exercise;Patient/family education;Balance training;Functional mobility training    PT Goals (Current goals can be found in the Care Plan section)  Acute Rehab PT Goals Patient Stated Goal: pt unable to state-lethargic but also non English speaking PT Goal Formulation: Patient unable to participate in goal setting Time For Goal Achievement: 02/23/21 Potential to Achieve Goals: Good    Frequency Min 3X/week     Co-evaluation               AM-PAC PT "6 Clicks" Mobility  Outcome Measure Help needed turning from your back to your side while in a flat bed without using bedrails?: A Lot Help needed moving from lying on your back to sitting on the side of a flat bed without using bedrails?: A Lot Help needed moving to and from a bed to a chair (including a wheelchair)?: Total Help needed standing up from a chair using your arms (e.g., wheelchair or bedside chair)?: Total Help needed to walk in hospital room?: Total Help needed climbing 3-5 steps with a railing? : Total 6 Click Score: 8    End of Session   Activity Tolerance: Patient limited by fatigue;Patient limited by lethargy Patient left: in bed;with call bell/phone within reach;with bed alarm set   PT Visit Diagnosis: Muscle weakness (generalized) (M62.81);Difficulty in walking, not elsewhere classified (R26.2)    Time: 3545-6256 PT Time Calculation (min) (ACUTE ONLY): 18 min   Charges:   PT Evaluation $PT Eval Moderate Complexity: 1 Mod             Doreatha Massed, PT Acute Rehabilitation   Office: [phone] Pager: [phone]

## 2021-02-10 ENCOUNTER — Ambulatory Visit: Payer: Medicare HMO | Admitting: Pulmonary Disease

## 2021-02-10 DIAGNOSIS — J9622 Acute and chronic respiratory failure with hypercapnia: Secondary | ICD-10-CM | POA: Diagnosis not present

## 2021-02-10 DIAGNOSIS — I509 Heart failure, unspecified: Secondary | ICD-10-CM | POA: Diagnosis not present

## 2021-02-10 DIAGNOSIS — J9621 Acute and chronic respiratory failure with hypoxia: Secondary | ICD-10-CM | POA: Diagnosis not present

## 2021-02-10 DIAGNOSIS — I272 Pulmonary hypertension, unspecified: Secondary | ICD-10-CM | POA: Diagnosis not present

## 2021-02-10 LAB — BLOOD GAS, ARTERIAL
Acid-Base Excess: 16.9 mmol/L — ABNORMAL HIGH (ref 0.0–2.0)
Bicarbonate: 44 mmol/L — ABNORMAL HIGH (ref 20.0–28.0)
O2 Saturation: 96.5 %
Patient temperature: 98.6
pCO2 arterial: 76 mmHg (ref 32.0–48.0)
pH, Arterial: 7.381 (ref 7.350–7.450)
pO2, Arterial: 91.2 mmHg (ref 83.0–108.0)

## 2021-02-10 LAB — CBC
HCT: 25.1 % — ABNORMAL LOW (ref 36.0–46.0)
Hemoglobin: 7.7 g/dL — ABNORMAL LOW (ref 12.0–15.0)
MCH: 25.6 pg — ABNORMAL LOW (ref 26.0–34.0)
MCHC: 30.7 g/dL (ref 30.0–36.0)
MCV: 83.4 fL (ref 80.0–100.0)
Platelets: 240 10*3/uL (ref 150–400)
RBC: 3.01 MIL/uL — ABNORMAL LOW (ref 3.87–5.11)
RDW: 15.7 % — ABNORMAL HIGH (ref 11.5–15.5)
WBC: 5.5 10*3/uL (ref 4.0–10.5)
nRBC: 0 % (ref 0.0–0.2)

## 2021-02-10 LAB — COOXEMETRY PANEL
Carboxyhemoglobin: 1.7 % — ABNORMAL HIGH (ref 0.5–1.5)
Methemoglobin: 1.3 % (ref 0.0–1.5)
O2 Saturation: 75.9 %
Total hemoglobin: 7.4 g/dL — ABNORMAL LOW (ref 12.0–16.0)

## 2021-02-10 LAB — MAGNESIUM: Magnesium: 2.8 mg/dL — ABNORMAL HIGH (ref 1.7–2.4)

## 2021-02-10 LAB — BASIC METABOLIC PANEL
BUN: 69 mg/dL — ABNORMAL HIGH (ref 8–23)
CO2: >45 mmol/L — ABNORMAL HIGH (ref 22–32)
Calcium: 8.5 mg/dL — ABNORMAL LOW (ref 8.9–10.3)
Chloride: 81 mmol/L — ABNORMAL LOW (ref 98–111)
Creatinine, Ser: 2.9 mg/dL — ABNORMAL HIGH (ref 0.44–1.00)
GFR, Estimated: 16 mL/min — ABNORMAL LOW (ref >60)
Glucose, Bld: 122 mg/dL — ABNORMAL HIGH (ref 70–99)
Potassium: 3.8 mmol/L (ref 3.5–5.1)
Sodium: 133 mmol/L — ABNORMAL LOW (ref 135–145)

## 2021-02-10 MED ORDER — FLUTICASONE PROPIONATE 50 MCG/ACT NA SUSP
1.0000 | Freq: Every day | NASAL | Status: DC
  Administered 2021-02-10 – 2021-02-22 (×13): 1 via NASAL
  Filled 2021-02-10 (×2): qty 16

## 2021-02-10 MED ORDER — DOCUSATE SODIUM 100 MG PO CAPS
100.0000 mg | ORAL_CAPSULE | Freq: Two times a day (BID) | ORAL | Status: DC | PRN
  Administered 2021-02-10 – 2021-02-11 (×2): 100 mg via ORAL
  Filled 2021-02-10 (×2): qty 1

## 2021-02-10 MED ORDER — ORAL CARE MOUTH RINSE
15.0000 mL | Freq: Two times a day (BID) | OROMUCOSAL | Status: DC
  Administered 2021-02-10 – 2021-02-22 (×23): 15 mL via OROMUCOSAL

## 2021-02-10 NOTE — TOC Progression Note (Signed)
Transition of Care [facility]) - Progression Note    Patient Details  Name: Mandy [name] MRN: [id number] Date of Birth: [birth year]  Transition of Care [facility]) CM/SW Contact  Secret Kristensen, Juliann Pulse, RN Phone Number: 02/10/2021, 3:14 PM  Clinical Narrative: Damaris Schooner to dtr [name] about recc SNF-she declines SNF-wants home w/HHC/personal care services. Lenn Cal already following for HHRN/PT/OT/aide/CSW;[facility] private duty rep Verdene Lennert aware of referral for custodial level care(they will eval once at home)[facility] home 02 already-dtr has travel tank to bring to hospital @ d/c;Contacted [employer]tech rep Jermaine to pick up his home 02 tanks from patient's home.[name] would like a hospital bed-will inform [facility], & MD. Continue to monitor for d/c plans.      Expected Discharge Plan: [place] Barriers to Discharge: Continued Medical Work up  Expected Discharge Plan and Services Expected Discharge Plan: [place]   Discharge Planning Services: CM Consult Post Acute Care Choice: NA Living arrangements for the past 2 months: Single Family Home                                       Social Determinants of Health (SDOH) Interventions    Readmission Risk Interventions Readmission Risk Prevention Plan 02/05/2021 11/06/2020  Transportation Screening Complete Complete  PCP or Specialist Appt within 3-5 Days - Complete  [place] or [place] - Complete  Social Work Consult for [place] Planning/Counseling - Complete  Palliative Care Screening - Complete  Medication Review Press photographer) Complete Complete  PCP or Specialist appointment within 3-5 days of discharge Complete -  [place] or Home Care Consult Complete -  SW Recovery Care/Counseling Consult Complete -  Palliative Care Screening Complete -  [place] Not Applicable -  Some recent data might be hidden

## 2021-02-10 NOTE — Evaluation (Signed)
Occupational Therapy Evaluation Patient Details Name: Mandy [name] MRN: [id number] DOB: 09/01/1940 Today's Date: 02/10/2021   History of Present Illness 81 yo female admitted with acute on chronic resp failure, acute on chronic HF, bil pleural effusion. Hx of anemia, severe pulm HTN, DM, HF, CVA, CAD, CKD. Pt is non English speaking-family interprets-hospital interpreter not available   Clinical Impression   Unable to obtain information from patient due to non English speaking, hospital interpreter not available and no family present at time of evaluation. Per previous notes patient's family assists as needed with self care and mobility. Currently patient needing mod A for bed mobility and min A +2 for safety with stand pivot transfers first to [facility], then recliner. Patient does exhibit some jerking movements in standing and while sitting on commode. If family is able to provide current assist levels would recommend return home with family as patient likely to recover better in own environment.       Recommendations for follow up therapy are one component of a multi-disciplinary discharge planning process, led by the attending physician.  Recommendations may be updated based on patient status, additional functional criteria and insurance authorization.   Follow Up Recommendations  No OT follow up (If family can provide current assist levels)    Assistance Recommended at Discharge Frequent or constant Supervision/Assistance  Patient can return home with the following A lot of help with bathing/dressing/bathroom;A lot of help with walking and/or transfers    Functional Status Assessment  Patient has had a recent decline in their functional status and demonstrates the ability to make significant improvements in function in a reasonable and predictable amount of time.  Equipment Recommendations  3N1 bedside commode      Precautions / Restrictions Precautions Precautions:  Fall Restrictions Weight Bearing Restrictions: No      Mobility Bed Mobility Overal bed mobility: Needs Assistance Bed Mobility: Supine to Sit     Supine to sit: Mod assist, HOB elevated     General bed mobility comments: Assist to upright trunk and slide legs off edge of bed        Balance Overall balance assessment: Needs assistance Sitting-balance support: Feet unsupported Sitting balance-Leahy Scale: Fair     Standing balance support: Bilateral upper extremity supported Standing balance-Leahy Scale: Poor                             ADL either performed or assessed with clinical judgement   ADL Overall ADL's : Needs assistance/impaired     Grooming: Set up;Wash/dry face;Sitting   Upper Body Bathing: Set up;Supervision/ safety;Sitting   Lower Body Bathing: Maximal assistance;Sitting/lateral leans;Sit to/from stand   Upper Body Dressing : Set up;Supervision/safety;Sitting   Lower Body Dressing: Maximal assistance;Sit to/from stand;Sitting/lateral leans   Toilet Transfer: Minimal assistance;+2 for physical assistance;+2 for safety/equipment;Cueing for sequencing;Stand-pivot;BSC/3in1 Toilet Transfer Details (indicate cue type and reason): Patient able to follow visual cues to transfer to [facility]. Note jerky movements at times in lower extremities with min A +2 for safety. Hand held assist for pivot to commode, then recliner Toileting- Clothing Manipulation and Hygiene: Total assistance;Sitting/lateral lean;Sit to/from stand Toileting - Clothing Manipulation Details (indicate cue type and reason): Limited standing balance     Functional mobility during ADLs: Minimal assistance;+2 for physical assistance;+2 for safety/equipment;Cueing for sequencing General ADL Comments: Unsure how much assist patient has at baseline, currently needing assist for LB ADLs, transfers      Pertinent Vitals/Pain Pain  Assessment Pain Assessment: Faces Faces Pain Scale: No  hurt     Hand Dominance Right   Extremity/Trunk Assessment Upper Extremity Assessment Upper Extremity Assessment: Generalized weakness   Lower Extremity Assessment Lower Extremity Assessment: Defer to PT evaluation       Communication Communication Communication: Prefers language other than English   Cognition Arousal/Alertness: Awake/alert Behavior During Therapy: Flat affect Overall Cognitive Status: Difficult to assess                                                  Home Living Family/patient expects to be discharged to:: Unsure Living Arrangements: Children Available Help at Discharge: Family;Available 24 hours/day Type of Home: House Home Access: Level entry     Home Layout: One level               Home Equipment: Conservation officer, nature (2 wheels)   Additional Comments: no family present-info taken from previous admission      Prior Functioning/Environment Prior Level of Function : Needs assist               ADLs Comments: per previous admission patient has assist as needed from family for self care        OT Problem List: Decreased strength;Decreased activity tolerance;Impaired balance (sitting and/or standing)      OT Treatment/Interventions: Self-care/ADL training;Therapeutic exercise;Balance training;Patient/family education;Therapeutic activities    OT Goals(Current goals can be found in the care plan section) Acute Rehab OT Goals Patient Stated Goal: Indicates wanting to use BSC OT Goal Formulation: With patient Time For Goal Achievement: 02/24/21 Potential to Achieve Goals: Good  OT Frequency: Min 2X/week       AM-PAC OT "6 Clicks" Daily Activity     Outcome Measure Help from another person eating meals?: A Little Help from another person taking care of personal grooming?: A Little Help from another person toileting, which includes using toliet, bedpan, or urinal?: A Lot Help from another person bathing (including  washing, rinsing, drying)?: A Lot Help from another person to put on and taking off regular upper body clothing?: A Little Help from another person to put on and taking off regular lower body clothing?: A Lot 6 Click Score: 15   End of Session Equipment Utilized During Treatment: Oxygen Nurse Communication: Mobility status  Activity Tolerance: Patient tolerated treatment well Patient left: in chair;with call bell/phone within reach;with chair alarm set;with nursing/sitter in room  OT Visit Diagnosis: Unsteadiness on feet (R26.81);Other abnormalities of gait and mobility (R26.89);Muscle weakness (generalized) (M62.81)                Time: 2637-8588 OT Time Calculation (min): 16 min Charges:  OT General Charges $OT Visit: 1 Visit OT Evaluation $OT Eval Low Complexity: [place] OT OT pager: Madelia 02/10/2021, 12:07 PM

## 2021-02-10 NOTE — TOC Progression Note (Signed)
Transition of Care [facility]) - Progression Note    Patient Details  Name: Mandy [name] MRN: [id number] Date of Birth: 01-08-1940  [facility] of [facility]) CM/SW Contact  Emory Leaver, Juliann Pulse, RN Phone Number: 02/10/2021, 2:45 PM  Clinical Narrative: PT recc SNF-left vm w/dtr [place]-await call back to discuss recc SNF.      Expected Discharge Plan: [place] Barriers to Discharge: Continued Medical Work up  Expected Discharge Plan and Services Expected Discharge Plan: [place]   Discharge Planning Services: CM Consult Post Acute Care Choice: NA Living arrangements for the past 2 months: Single Family Home                                       Social Determinants of Health (SDOH) Interventions    Readmission Risk Interventions Readmission Risk Prevention Plan 02/05/2021 11/06/2020  Transportation Screening Complete Complete  PCP or Specialist Appt within 3-5 Days - Complete  HRI or [place] - Complete  Social Work Consult for [place] Planning/Counseling - Complete  Palliative Care Screening - Complete  Medication Review Press photographer) Complete Complete  PCP or Specialist appointment within 3-5 days of discharge Complete -  [place] or Home Care Consult Complete -  SW Recovery Care/Counseling Consult Complete -  Palliative Care Screening Complete -  [place] Not Applicable -  Some recent data might be hidden

## 2021-02-10 NOTE — Progress Notes (Signed)
NAME:  Mandy [name], MRN:  [id number], DOB:  [birth year], LOS: 7 ADMISSION DATE:  02/02/2021, CONSULTATION DATE:  02/05/21 REFERRING MD:  Dwyane Dee CHIEF COMPLAINT:  Hypercarbia   History of Present Illness:  Mandy [name] is a 81 y.o. F with PMH significant for severe pulmonary HTN, HTN, HL, DM, recent pleural effusions, goiter and thyroid disease and HFpEF who presented to the ED 2/1 after desaturating during  a sleep study to 30% for about 10 minutes.  She had increased pleural effusions on CXR and was requiring 4L O2, so admitted to telemetry and treated with IV Lasix.   On 2/2 she was increasingly dyspneic and ABG showed pH 7.3 with pCO2 of 77 and PCCM was consulted.    Pertinent  Medical History  Diabetes Mellitus GERD CKD IIIa Hypertension Hyperthyroidism Mild CAD Pulmonary Hypertension Stroke  Significant Hospital Events: Including procedures, antibiotic start and stop dates in addition to other pertinent events   2/1 admit to hospitalists 2/2 PCCM consult for hypercarbia 2/3 Patient had loss of consciousness with O2 desaturation with movement 2/5 milrinone dose titrated down, tolerated well 2/6 milrinone off, started sildenafil  Interim History / Subjective:   Patient feeling ok this morning. Having stomach discomfort again.  Co-ox 75.9 ABG pH 7.38, pCO2 76, pO2 91 Cr 2.9  Objective   Blood pressure (!) 141/61, pulse 70, temperature 98 F (36.7 C), temperature source Axillary, resp. rate (!) 24, height 4\' 11"  (1.499 m), weight 74 kg, SpO2 100 %. CVP:  [12 mmHg-13 mmHg] 13 mmHg      Intake/Output Summary (Last 24 hours) at 02/10/2021 1015 Last data filed at 02/10/2021 1000 Gross per 24 hour  Intake 137.42 ml  Output 550 ml  Net -412.58 ml   Filed Weights   02/08/21 0500 02/09/21 0536 02/10/21 0500  Weight: 72.6 kg 72.8 kg 74 kg   Examination: General: elderly woman, no acute distress, nasal canula in place HENT: [place]/AT, sclera anicteric, moist mucous  membranes Lungs: diminished breath sounds, non-labored, no wheezing Cardiovascular: rrr, s1s2, no murmurs Abdomen: soft, non-tender, non-distended, bowel sounds present Extremities: no edema, warm. Right PICC line in place. Neuro: alert, moving all extremities  Bedside US 2/6: Large, simple appearing right pleural effusion  Resolved Hospital Problem list     Assessment & Plan:  Acute on Chronic Hypoxemic and hypercapnic Respiratory Failure Acute on Chronic Diastolic Feart Failure Pulmonary Hypertension Bilateral Pleural Effusions, transudate based on pleural fluid studies 11/25/20 - Continue PRN bipap and qHS - Continue 40mg  lasix BID IV dosing  - continue sildenafil 20mg  TID, unable to use tadalafil due to renal function - HIV screen negative, ANA negative, Anti-histone ab negative  - Continue LABA/LAMA nebulizer treatments via brovana + yupelri - Large right pleural effusion on bedside US 2/6. Simple appearing. Will consider therapeutic thoracentesis once patient more stable and able to sit up on her own.  Chest Pain Nonobstructive CAD - cath 11/2020 - Troponins not elevated, EKG non-ischemic - Continue lipitor 80mg  daily  AKI on CKD IIIa Hypokalemia Hypochloremia - Increase in Cr likely from over diuresis. Somewhat better today - monitor renal function and electrolytes - replete potassium  Hypertension - holding hydralazine and atenolol  History of cryptogenic stroke: -Continue plavix 75mg  daily -Continue lipitor 80mg  daily  Hyperthyroidism - on methimazole - TSH wnl  Anemia, Chronic normocytic - monitor  Nutrition - will need to consider cortrak placement for nutritional needs  Best Practice (right click and "Reselect all SmartList Selections" daily)   Diet/type:  Regular consistency (see orders) DVT prophylaxis: prophylactic heparin  GI prophylaxis: N/A Lines: Central line - PICC Foley:  N/A Code Status:  full code Last date of multidisciplinary goals  of care discussion [palliative care consulted. Overall patient's prognosis is poor given her severe pulmonary hypertension and now decline in renal function on 2/7 ]  Labs   CBC: Recent Labs  Lab 02/06/21 0918 02/07/21 0500 02/08/21 0855 02/09/21 0302 02/10/21 0636  WBC 5.9 6.1 7.4 6.8 5.5  NEUTROABS 4.2  --   --   --   --   HGB 8.2* 8.0* 8.6* 7.6* 7.7*  HCT 26.7* 25.8* 28.4* 24.5* 25.1*  MCV 83.4 82.2 84.3 81.9 83.4  PLT 226 228 243 218 462    Basic Metabolic Panel: Recent Labs  Lab 02/05/21 0243 02/06/21 0918 02/06/21 1600 02/07/21 0500 02/08/21 0855 02/09/21 0302 02/09/21 0748 02/10/21 0636  NA 138   < > 133* 136  --  134* 134* 133*  K 3.7   < > 4.2 3.1*  --  4.4 4.0 3.8  CL 87*   < > 82* 80*  --  81* 80* 81*  CO2 43*   < > 45* >45*  --  44* >45* >45*  GLUCOSE 133*   < > 190* 136*  --  122* 138* 122*  BUN 40*   < > 42* 40*  --  63* 67* 69*  CREATININE 1.48*   < > 1.81* 1.80*  --  3.70* 3.39* 2.90*  CALCIUM 8.9   < > 8.8* 8.9  --  8.5* 8.7* 8.5*  MG 2.7*   < > 2.4 2.3 2.6* 2.7*  --  2.8*  PHOS 4.1  --   --   --   --   --   --   --    < > = values in this interval not displayed.   GFR: Estimated Creatinine Clearance: 13.8 mL/min (A) (by C-G formula based on SCr of 2.9 mg/dL (H)). Recent Labs  Lab 02/07/21 0500 02/08/21 0855 02/09/21 0302 02/10/21 0636  WBC 6.1 7.4 6.8 5.5    Liver Function Tests: Recent Labs  Lab 02/06/21 0918 02/09/21 0748  AST 23 32  ALT 26 19  ALKPHOS 58 47  BILITOT 0.5 0.3  PROT 6.6 6.3*  ALBUMIN 3.4* 3.2*   No results for input(s): LIPASE, AMYLASE in the last 168 hours. No results for input(s): AMMONIA in the last 168 hours.  ABG    Component Value Date/Time   PHART 7.381 02/10/2021 0855   PCO2ART 76.0 (HH) 02/10/2021 0855   PO2ART 91.2 02/10/2021 0855   HCO3 44.0 (H) 02/10/2021 0855   TCO2 47 (H) 11/30/2020 1632   O2SAT 96.5 02/10/2021 0855     Coagulation Profile: No results for input(s): INR, PROTIME in the last  168 hours.  Cardiac Enzymes: No results for input(s): CKTOTAL, CKMB, CKMBINDEX, TROPONINI in the last 168 hours.  HbA1C: Hgb A1c MFr Bld  Date/Time Value Ref Range Status  12/07/2020 01:44 AM 5.7 (H) 4.8 - 5.6 % Final    Comment:    (NOTE) Pre diabetes:          5.7%-6.4%  Diabetes:              >6.4%  Glycemic control for   <7.0% adults with diabetes   11/03/2020 06:43 PM 5.9 (H) 4.8 - 5.6 % Final    Comment:    (NOTE) Pre diabetes:  5.7%-6.4%  Diabetes:              >6.4%  Glycemic control for   <7.0% adults with diabetes     CBG: No results for input(s): GLUCAP in the last 168 hours.   Critical care time: n/a    Freda Jackson, MD [facility] Office: [phone]   See Amion for personal pager PCCM on call pager [phone] until 7pm. Please call Elink 7p-7a. [phone]

## 2021-02-11 ENCOUNTER — Inpatient Hospital Stay (HOSPITAL_COMMUNITY): Payer: Medicare HMO

## 2021-02-11 DIAGNOSIS — J9621 Acute and chronic respiratory failure with hypoxia: Secondary | ICD-10-CM | POA: Diagnosis not present

## 2021-02-11 DIAGNOSIS — J9622 Acute and chronic respiratory failure with hypercapnia: Secondary | ICD-10-CM | POA: Diagnosis not present

## 2021-02-11 DIAGNOSIS — I5033 Acute on chronic diastolic (congestive) heart failure: Secondary | ICD-10-CM | POA: Diagnosis not present

## 2021-02-11 DIAGNOSIS — I272 Pulmonary hypertension, unspecified: Secondary | ICD-10-CM | POA: Diagnosis not present

## 2021-02-11 LAB — MULTIPLE MYELOMA PANEL, SERUM
Albumin SerPl Elph-Mcnc: 3.2 g/dL (ref 2.9–4.4)
Albumin/Glob SerPl: 1.3 (ref 0.7–1.7)
Alpha 1: 0.2 g/dL (ref 0.0–0.4)
Alpha2 Glob SerPl Elph-Mcnc: 0.6 g/dL (ref 0.4–1.0)
B-Globulin SerPl Elph-Mcnc: 0.9 g/dL (ref 0.7–1.3)
Gamma Glob SerPl Elph-Mcnc: 1 g/dL (ref 0.4–1.8)
Globulin, Total: 2.6 g/dL (ref 2.2–3.9)
IgA: 169 mg/dL (ref 64–422)
IgG (Immunoglobin G), Serum: 991 mg/dL (ref 586–1602)
IgM (Immunoglobulin M), Srm: 130 mg/dL (ref 26–217)
Total Protein ELP: 5.8 g/dL — ABNORMAL LOW (ref 6.0–8.5)

## 2021-02-11 LAB — BASIC METABOLIC PANEL
BUN: 66 mg/dL — ABNORMAL HIGH (ref 8–23)
CO2: >45 mmol/L — ABNORMAL HIGH (ref 22–32)
Calcium: 8.5 mg/dL — ABNORMAL LOW (ref 8.9–10.3)
Chloride: 80 mmol/L — ABNORMAL LOW (ref 98–111)
Creatinine, Ser: 2 mg/dL — ABNORMAL HIGH (ref 0.44–1.00)
GFR, Estimated: 25 mL/min — ABNORMAL LOW (ref >60)
Glucose, Bld: 189 mg/dL — ABNORMAL HIGH (ref 70–99)
Potassium: 2.9 mmol/L — ABNORMAL LOW (ref 3.5–5.1)
Sodium: 135 mmol/L (ref 135–145)

## 2021-02-11 LAB — BODY FLUID CELL COUNT WITH DIFFERENTIAL
Eos, Fluid: 0 %
Lymphs, Fluid: 28 %
Monocyte-Macrophage-Serous Fluid: 62 % (ref 50–90)
Neutrophil Count, Fluid: 10 % (ref 0–25)
Total Nucleated Cell Count, Fluid: 116 cu mm (ref 0–1000)

## 2021-02-11 LAB — COOXEMETRY PANEL
Carboxyhemoglobin: 1.5 % (ref 0.5–1.5)
Methemoglobin: 1.5 % (ref 0.0–1.5)
O2 Saturation: 68 %
Total hemoglobin: 8.2 g/dL — ABNORMAL LOW (ref 12.0–16.0)

## 2021-02-11 LAB — CBC
HCT: 26.5 % — ABNORMAL LOW (ref 36.0–46.0)
Hemoglobin: 8.1 g/dL — ABNORMAL LOW (ref 12.0–15.0)
MCH: 25.3 pg — ABNORMAL LOW (ref 26.0–34.0)
MCHC: 30.6 g/dL (ref 30.0–36.0)
MCV: 82.8 fL (ref 80.0–100.0)
Platelets: 270 10*3/uL (ref 150–400)
RBC: 3.2 MIL/uL — ABNORMAL LOW (ref 3.87–5.11)
RDW: 15.3 % (ref 11.5–15.5)
WBC: 5.8 10*3/uL (ref 4.0–10.5)
nRBC: 0 % (ref 0.0–0.2)

## 2021-02-11 LAB — LACTATE DEHYDROGENASE, PLEURAL OR PERITONEAL FLUID: LD, Fluid: 85 U/L — ABNORMAL HIGH (ref 3–23)

## 2021-02-11 LAB — MAGNESIUM: Magnesium: 2.6 mg/dL — ABNORMAL HIGH (ref 1.7–2.4)

## 2021-02-11 LAB — ALBUMIN: Albumin: 3.2 g/dL — ABNORMAL LOW (ref 3.5–5.0)

## 2021-02-11 LAB — ALBUMIN, PLEURAL OR PERITONEAL FLUID: Albumin, Fluid: 2.2 g/dL

## 2021-02-11 LAB — LACTATE DEHYDROGENASE: LDH: 159 U/L (ref 98–192)

## 2021-02-11 LAB — PROTEIN, PLEURAL OR PERITONEAL FLUID: Total protein, fluid: 3.4 g/dL

## 2021-02-11 MED ORDER — POTASSIUM CHLORIDE 20 MEQ PO PACK: 40.0000 meq | PACK | Freq: Two times a day (BID) | ORAL | Status: DC

## 2021-02-11 MED ORDER — POTASSIUM CHLORIDE CRYS ER 10 MEQ PO TBCR
40.0000 meq | EXTENDED_RELEASE_TABLET | ORAL | Status: AC
  Administered 2021-02-11 (×2): 40 meq via ORAL
  Filled 2021-02-11 (×2): qty 4

## 2021-02-11 NOTE — Care Management (Cosign Needed Addendum)
°  °  Durable Medical Equipment  (From admission, onward)           Start     Ordered   02/11/21 1437  For home use only DME Hospital bed  Once       Question Answer Comment  Length of Need Lifetime   The above medical condition requires: Patient requires the ability to reposition frequently   Head must be elevated greater than: 30 degrees   Bed type Semi-electric   Support Surface: Gel Overlay      02/11/21 1437

## 2021-02-11 NOTE — Procedures (Signed)
Thoracentesis  Procedure Note  Mandy [name]  [id number]  1940/01/19  Date:02/11/21  Time:4:31 PM   Provider Performing:Xavior Niazi B Caitlynn Ju   Procedure: Thoracentesis with imaging guidance (83094)  Indication(s) Pleural Effusion  Consent Risks of the procedure as well as the alternatives and risks of each were explained to the patient and/or caregiver.  Consent for the procedure was obtained and is signed in the bedside chart  Anesthesia Topical only with 1% lidocaine    Time Out Verified patient identification, verified procedure, site/side was marked, verified correct patient position, special equipment/implants available, medications/allergies/relevant history reviewed, required imaging and test results available.   Sterile Technique Maximal sterile technique including full sterile barrier drape, hand hygiene, sterile gown, sterile gloves, mask, hair covering, sterile ultrasound probe cover (if used).  Procedure Description Ultrasound was used to identify appropriate pleural anatomy for placement and overlying skin marked.  Area of drainage cleaned and draped in sterile fashion. Lidocaine was used to anesthetize the skin and subcutaneous tissue.  600 cc's of cloudy, dark yellow appearing fluid was drained from the right pleural space. Catheter then removed and bandaid applied to site.   Complications/Tolerance None; patient tolerated the procedure well. Chest X-ray is ordered to confirm no post-procedural complication.   EBL Minimal   Specimen(s) Pleural fluid

## 2021-02-11 NOTE — Progress Notes (Addendum)
Progress Note  Patient Name: Mandy [name] Date of Encounter: 02/11/2021  [facility] Cardiologist: Donato Heinz, MD   Subjective   No acute overnight events. Based on notes yesterday, sounds like patient is more alert today. However, I had difficult communicating with patient today. Patient speaks [place]. I tried to use phone interpreter but [place] interpreters were unavailable. Daughter who has been helping interpret is not here but is supposed to be here later today. From what I can gather, patient states breathing is worse this morning. No labored breathing on exam. No chest pain. All labs this morning are still pending.  Inpatient Medications    Scheduled Meds:  arformoterol  15 mcg Nebulization BID   atorvastatin  80 mg Oral QPM   Chlorhexidine Gluconate Cloth  6 each Topical Daily   clopidogrel  75 mg Oral Daily   fluticasone  1 spray Each Nare Daily   furosemide  40 mg Intravenous Q12H   heparin  5,000 Units Subcutaneous Q8H   mouth rinse  15 mL Mouth Rinse BID   melatonin  10 mg Oral QHS   methimazole  2.5 mg Oral Daily   pantoprazole  40 mg Oral q morning   revefenacin  175 mcg Nebulization Daily   sildenafil  20 mg Oral TID   sodium chloride flush  10-40 mL Intracatheter Q12H   sodium chloride flush  3 mL Intravenous Q12H   Continuous Infusions:  PRN Meds: acetaminophen **OR** acetaminophen, albuterol, alum & mag hydroxide-simeth, docusate sodium, hydrALAZINE, ondansetron (ZOFRAN) IV, sodium chloride flush   Vital Signs    Vitals:   02/11/21 0400 02/11/21 0500 02/11/21 0600 02/11/21 0719  BP: (!) 148/41 (!) 142/41 (!) 142/55   Pulse:      Resp: 20 (!) 22 (!) 27 (!) 22  Temp: 98 F (36.7 C)     TempSrc: Oral     SpO2: 98%  100% 100%  Weight:  72.8 kg    Height:        Intake/Output Summary (Last 24 hours) at 02/11/2021 0752 Last data filed at 02/11/2021 0600 Gross per 24 hour  Intake 370 ml  Output 1700 ml  Net -1330 ml   Last 3  Weights 02/11/2021 02/10/2021 02/09/2021  Weight (lbs) 160 lb 7.9 oz 163 lb 2.3 oz 160 lb 7.9 oz  Weight (kg) 72.8 kg 74 kg 72.8 kg      Telemetry    Normal sinus rhythm with rate in the 60s. - Personally Reviewed  ECG    No new ECG tracing today. - Personally Reviewed  Physical Exam   GEN: No acute distress. On 4L of HFNC. Neck: Supple. JVP not visualized. Cardiac: RRR. No significant murmurs, rubs, or gallops.  Respiratory: No increased work of breathing. Diminished breath sounds throughout but no wheezes, rhonchi, or rales appreciated.  GI: Soft, non-distended, and non-tender. MS: No lower extremity edema. No deformity. Skin: Warm and dry. Neuro:  No focal deficits. Psych: Normal affect. Responds appropriately.  Labs    High Sensitivity Troponin:   Recent Labs  Lab 02/03/21 0316 02/05/21 0933 02/05/21 1746 02/06/21 0854 02/06/21 1300  TROPONINIHS 59* 66* 65* 92* 85*     Chemistry Recent Labs  Lab 02/06/21 0918 02/06/21 1300 02/08/21 0855 02/09/21 0302 02/09/21 0748 02/10/21 0636  NA 137   < >  --  134* 134* 133*  K 2.8*   < >  --  4.4 4.0 3.8  CL 81*   < >  --  81* 80* 81*  CO2 47*   < >  --  44* >45* >45*  GLUCOSE 148*   < >  --  122* 138* 122*  BUN 39*   < >  --  63* 67* 69*  CREATININE 1.70*   < >  --  3.70* 3.39* 2.90*  CALCIUM 8.9   < >  --  8.5* 8.7* 8.5*  MG  --    < > 2.6* 2.7*  --  2.8*  PROT 6.6  --   --   --  6.3*  --   ALBUMIN 3.4*  --   --   --  3.2*  --   AST 23  --   --   --  32  --   ALT 26  --   --   --  19  --   ALKPHOS 58  --   --   --  47  --   BILITOT 0.5  --   --   --  0.3  --   GFRNONAA 30*   < >  --  12* 13* 16*  ANIONGAP 9   < >  --  9 NOT CALCULATED NOT CALCULATED   < > = values in this interval not displayed.    Lipids  Recent Labs  Lab 02/05/21 0243  CHOL 165  TRIG 57  HDL 59  LDLCALC 95  CHOLHDL 2.8    Hematology Recent Labs  Lab 02/08/21 0855 02/09/21 0302 02/10/21 0636  WBC 7.4 6.8 5.5  RBC 3.37* 2.99* 3.01*   HGB 8.6* 7.6* 7.7*  HCT 28.4* 24.5* 25.1*  MCV 84.3 81.9 83.4  MCH 25.5* 25.4* 25.6*  MCHC 30.3 31.0 30.7  RDW 15.9* 15.5 15.7*  PLT 243 218 240   Thyroid  Recent Labs  Lab 02/05/21 0243  TSH 1.753    BNP Recent Labs  Lab 02/05/21 0933 02/06/21 0918  BNP 1,051.4* 469.6*    DDimer No results for input(s): DDIMER in the last 168 hours.   Radiology    No results found.  Cardiac Studies   Left Cardiac Catheterization 11/24/2020:   Mid LAD lesion is 30% stenosed.   Dist LAD lesion is 30% stenosed.   LV end diastolic pressure is low.   LV end diastolic pressure is normal.   The left ventricular ejection fraction is 55-65% by visual estimate.   Hemodynamic findings consistent with moderate pulmonary hypertension.   1.  Mild obstructive coronary artery disease. 2.  Preserved cardiac output of 6.1 L/min and an index of 3.55 L/min/m. 3.  Mean pulmonary artery pressure of 39 mmHg with pulmonary vascular resistance of 3.44 [place] units; transpulmonary gradient of 47mmHg (mean wedge pressure 58mmHg) 4.  LVEDP of 9 mmHg with preserved LVEF.   Recommendation: Medical therapy.  Diagnostic Dominance: Right  _______________  Echocardiogram 02/05/2021: Impressions:  1. Severe pulmonary hypertension estimated PA systolic 710 mmHg.   2. Left ventricular ejection fraction, by estimation, is 60 to 65%. The  left ventricle has normal function. The left ventricle has no regional  wall motion abnormalities. There is severe left ventricular hypertrophy.  Left ventricular diastolic parameters   are consistent with Grade I diastolic dysfunction (impaired relaxation).   3. Right ventricular systolic function is moderately reduced. The right  ventricular size is moderately enlarged. There is severely elevated  pulmonary artery systolic pressure.   4. The pericardial effusion is posterior to the left ventricle.   5. The mitral valve is abnormal. Trivial  mitral valve regurgitation. No   evidence of mitral stenosis.   6. Tricuspid valve regurgitation is severe.   7. The aortic valve is tricuspid. There is mild calcification of the  aortic valve. Aortic valve regurgitation is not visualized. Aortic valve  sclerosis is present, with no evidence of aortic valve stenosis.   8. The inferior vena cava is dilated in size with <50% respiratory  variability, suggesting right atrial pressure of 15 mmHg.  Patient Profile     81 y.o. 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  Assessment & Plan    Acute on Chronic Hypoxic/Hypercapneic Respiratory Failure Acute on Chronic Diastolic HF: Severe Pulmonary HTN: Recurrent Bilateral Pleural Effusions: Patient presented after profound hypoxic event with SpO2 in 30s during sleep for about 19min. Chest x-ray notable for bilateral pleural effusions. BNP 670. Underwent thoracentesis and was placed on inotropic support to aide in diuresis with improvement. TTE showed EF 60-65%, G1DD, severe pulmonary HTN with PASP 163mmHg. Now off Milrinone and transitioned to Sildenafil. She is now off IV Lasix drip and is on IV Lasix 40mg  twice daily. Documented urinary output of 1.7 L yesterday and net negative 3.7 L this admission. Weight down 3 lbs from yesterday. Renal function improving with diuresis.     - Still on 4L  of high flow nasal cannula. Does not appear grossly volume overloaded on exam but still has dimished breath sounds through. - Continue IV Lasix 40mg  twice daily for now. - Continue Sildenafil 20mg  three times daily. - BiPAP management per CCM. - Possible plans for thoracentesis for large right pleural effusion once patient more stable and able to sit up on her own.  Management per CCM. - Overall, poor prognosis given severe pulmonary hypertension but wishes to continue with full treatment as able per discussions with palliative.   Non-Obstructive CAD Noted on cath in 11/2020. High-sensitivity troponin minimally elevated and flat peaking at 92 consistent with demand ischemia. - No chest pain. - On Plavix given history of stroke. - Continue Lipitor 80mg  daily.   HTN: BP initially soft after starting Sildenafil but better now. Mostly well controlled. - Continue medications for CHF as above.   AKI on CKD Stage 3A Creatinine 1.62 on admission and peaked at 3.7 on 2/7. Trending down the last few day and 2.9 yesterday. Baseline around 1.2 to 1.5. AKI felt to likely be due to overdiuresis with IV Lasix drip.  - Today's labs pending. - Continue to monitor closely with diuresis.   History of Cryptogenic Stroke - Continue Plavix and Lipitor.  Chronic Anemia Baseline hemoglobin around 7.5 to 9.5. - Stable at 7.7 yesterday. Today's labs pending.  For questions or updates, please contact [place] Please consult [email or web address] for contact info under        Signed, Darreld Mclean, PA-C  02/11/2021, 7:52 AM    Patient seen and examined and agree with Sande Rives, PA-C  In brief, the patient  is a 81 y.o. 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  TTE showed EF 60-65%, G1DD, severe pulmonary HTN with PASP 167mmHg. Was initially on milrinone and lasix gtt now transitioned to sildenafil and lasix 40mg  IV BID. Renal function improving with transitioning off lasix gtt. Continues to require intermittent BiPAP with pCO2 retention. Overall, prognosis is poor however patient and family want to continue to pursue full treatment.   GEN: Sitting up in bed, more awake and alert today  Neck: Prominent v-waves due to severe TR Cardiac: RRR, 2/6 systolic murmur Respiratory: Significantly decreased at the bases GI: Soft, nontender, non-distended  MS: No edema; No deformity. Warm. Neuro:  Nonfocal  Psych: Normal affect    Plan: -Continue sildenafil 20mg  TID -Continue lasix 40mg  IV BID -Possible plan for thoracentesis with CCM once able to sit up on her own -On intermittent BiPAP -Atenolol stopped due to AKI -Can likely resume hydralazine tomorrow -Overall poor prognosis, appreciate palliative care recommendations  Gwyndolyn Kaufman, MD

## 2021-02-11 NOTE — Progress Notes (Signed)
NAME:  Mandy [name], MRN:  [id number], DOB:  Apr 18, 1940, LOS: 8 ADMISSION DATE:  02/02/2021, CONSULTATION DATE:  02/05/21 REFERRING MD:  Dwyane Dee CHIEF COMPLAINT:  Hypercarbia   History of Present Illness:  Mandy [name] is a 81 y.o. F with PMH significant for severe pulmonary HTN, HTN, HL, DM, recent pleural effusions, goiter and thyroid disease and HFpEF who presented to the ED 2/1 after desaturating during  a sleep study to 30% for about 10 minutes.  She had increased pleural effusions on CXR and was requiring 4L O2, so admitted to telemetry and treated with IV Lasix.   On 2/2 she was increasingly dyspneic and ABG showed pH 7.3 with pCO2 of 77 and PCCM was consulted.    Pertinent  Medical History  Diabetes Mellitus GERD CKD IIIa Hypertension Hyperthyroidism Mild CAD Pulmonary Hypertension Stroke  Significant Hospital Events: Including procedures, antibiotic start and stop dates in addition to other pertinent events   2/1 admit to hospitalists 2/2 PCCM consult for hypercarbia 2/3 Patient had loss of consciousness with O2 desaturation with movement 2/5 milrinone dose titrated down, tolerated well 2/6 milrinone off, started sildenafil  Interim History / Subjective:   Patient feeling ok this morning. No acute issues  Co-ox 68 Cr 2.0  Objective   Blood pressure (!) 142/55, pulse 72, temperature 98 F (36.7 C), temperature source Oral, resp. rate (!) 22, height 4\' 11"  (1.499 m), weight 72.8 kg, SpO2 100 %. CVP:  [9 mmHg-13 mmHg] 11 mmHg      Intake/Output Summary (Last 24 hours) at 02/11/2021 0816 Last data filed at 02/11/2021 0600 Gross per 24 hour  Intake 370 ml  Output 1700 ml  Net -1330 ml   Filed Weights   02/09/21 0536 02/10/21 0500 02/11/21 0500  Weight: 72.8 kg 74 kg 72.8 kg   Examination: General: elderly woman, no acute distress, nasal canula in place HENT: [place]/AT, sclera anicteric, moist mucous membranes Lungs: diminished breath sounds, non-labored, no  wheezing Cardiovascular: rrr, s1s2, no murmurs Abdomen: soft, non-tender, non-distended, bowel sounds present Extremities: no edema, warm. Right PICC line in place. Neuro: alert, moving all extremities  Bedside US 2/6: Large, simple appearing right pleural effusion  Resolved Hospital Problem list     Assessment & Plan:  Acute on Chronic Hypoxemic and hypercapnic Respiratory Failure Acute on Chronic Diastolic Feart Failure Pulmonary Hypertension Bilateral Pleural Effusions, transudate based on pleural fluid studies 11/25/20 - Continue qHS and when sleeping during the day - Continue 40mg  lasix BID IV dosing  - continue sildenafil 20mg  TID, unable to use tadalafil due to renal function - HIV screen negative, ANA negative, Anti-histone ab negative  - Continue LABA/LAMA nebulizer treatments via brovana + yupelri - Large right pleural effusion on bedside US 2/6. Simple appearing. Will consider therapeutic thoracentesis once patient more stable and able to sit up on her own.  Chest Pain Nonobstructive CAD - cath 11/2020 - Troponins not elevated, EKG non-ischemic - Continue lipitor 80mg  daily  AKI on CKD IIIa Hypokalemia Hypochloremia - Cr improving - monitor renal function and electrolytes - replete potassium  Hypertension - holding hydralazine and atenolol  History of cryptogenic stroke: -Continue plavix 75mg  daily -Continue lipitor 80mg  daily  Hyperthyroidism - on methimazole - TSH wnl  Anemia, Chronic normocytic - monitor  Best Practice (right click and "Reselect all SmartList Selections" daily)   Diet/type: Regular consistency (see orders) DVT prophylaxis: prophylactic heparin  GI prophylaxis: N/A Lines: Central line - PICC Foley:  N/A Code Status:  full code  Last date of multidisciplinary goals of care discussion [facility] care consulted. Overall patient's prognosis is poor given her severe pulmonary hypertension and now decline in renal function on 2/7  ]  Labs   CBC: Recent Labs  Lab 02/06/21 0918 02/07/21 0500 02/08/21 0855 02/09/21 0302 02/10/21 0636  WBC 5.9 6.1 7.4 6.8 5.5  NEUTROABS 4.2  --   --   --   --   HGB 8.2* 8.0* 8.6* 7.6* 7.7*  HCT 26.7* 25.8* 28.4* 24.5* 25.1*  MCV 83.4 82.2 84.3 81.9 83.4  PLT 226 228 243 218 287    Basic Metabolic Panel: Recent Labs  Lab 02/05/21 0243 02/06/21 0918 02/06/21 1600 02/07/21 0500 02/08/21 0855 02/09/21 0302 02/09/21 0748 02/10/21 0636  NA 138   < > 133* 136  --  134* 134* 133*  K 3.7   < > 4.2 3.1*  --  4.4 4.0 3.8  CL 87*   < > 82* 80*  --  81* 80* 81*  CO2 43*   < > 45* >45*  --  44* >45* >45*  GLUCOSE 133*   < > 190* 136*  --  122* 138* 122*  BUN 40*   < > 42* 40*  --  63* 67* 69*  CREATININE 1.48*   < > 1.81* 1.80*  --  3.70* 3.39* 2.90*  CALCIUM 8.9   < > 8.8* 8.9  --  8.5* 8.7* 8.5*  MG 2.7*   < > 2.4 2.3 2.6* 2.7*  --  2.8*  PHOS 4.1  --   --   --   --   --   --   --    < > = values in this interval not displayed.   GFR: Estimated Creatinine Clearance: 13.7 mL/min (A) (by C-G formula based on SCr of 2.9 mg/dL (H)). Recent Labs  Lab 02/07/21 0500 02/08/21 0855 02/09/21 0302 02/10/21 0636  WBC 6.1 7.4 6.8 5.5    Liver Function Tests: Recent Labs  Lab 02/06/21 0918 02/09/21 0748  AST 23 32  ALT 26 19  ALKPHOS 58 47  BILITOT 0.5 0.3  PROT 6.6 6.3*  ALBUMIN 3.4* 3.2*   No results for input(s): LIPASE, AMYLASE in the last 168 hours. No results for input(s): AMMONIA in the last 168 hours.  ABG    Component Value Date/Time   PHART 7.381 02/10/2021 0855   PCO2ART 76.0 (HH) 02/10/2021 0855   PO2ART 91.2 02/10/2021 0855   HCO3 44.0 (H) 02/10/2021 0855   TCO2 47 (H) 11/30/2020 1632   O2SAT 96.5 02/10/2021 0855     Coagulation Profile: No results for input(s): INR, PROTIME in the last 168 hours.  Cardiac Enzymes: No results for input(s): CKTOTAL, CKMB, CKMBINDEX, TROPONINI in the last 168 hours.  HbA1C: Hgb A1c MFr Bld  Date/Time Value  Ref Range Status  12/07/2020 01:44 AM 5.7 (H) 4.8 - 5.6 % Final    Comment:    (NOTE) Pre diabetes:          5.7%-6.4%  Diabetes:              >6.4%  Glycemic control for   <7.0% adults with diabetes   11/03/2020 06:43 PM 5.9 (H) 4.8 - 5.6 % Final    Comment:    (NOTE) Pre diabetes:          5.7%-6.4%  Diabetes:              >6.4%  Glycemic control for   <7.0% adults  with diabetes     CBG: No results for input(s): GLUCAP in the last 168 hours.   Critical care time: n/a    Freda Jackson, MD [facility] Office: [phone]   See Amion for personal pager PCCM on call pager [phone] until 7pm. Please call [place] 7p-7a. [phone]

## 2021-02-12 ENCOUNTER — Inpatient Hospital Stay (HOSPITAL_COMMUNITY): Payer: Medicare HMO

## 2021-02-12 DIAGNOSIS — J9622 Acute and chronic respiratory failure with hypercapnia: Secondary | ICD-10-CM | POA: Diagnosis not present

## 2021-02-12 DIAGNOSIS — J9621 Acute and chronic respiratory failure with hypoxia: Secondary | ICD-10-CM | POA: Diagnosis not present

## 2021-02-12 DIAGNOSIS — I509 Heart failure, unspecified: Secondary | ICD-10-CM | POA: Diagnosis not present

## 2021-02-12 DIAGNOSIS — I272 Pulmonary hypertension, unspecified: Secondary | ICD-10-CM | POA: Diagnosis not present

## 2021-02-12 LAB — COOXEMETRY PANEL
Carboxyhemoglobin: 1.2 % (ref 0.5–1.5)
Methemoglobin: 0.5 % (ref 0.0–1.5)
O2 Saturation: 61.8 %
Total hemoglobin: 8 g/dL — ABNORMAL LOW (ref 12.0–16.0)

## 2021-02-12 LAB — BASIC METABOLIC PANEL
BUN: 53 mg/dL — ABNORMAL HIGH (ref 8–23)
CO2: >45 mmol/L — ABNORMAL HIGH (ref 22–32)
Calcium: 8.4 mg/dL — ABNORMAL LOW (ref 8.9–10.3)
Chloride: 83 mmol/L — ABNORMAL LOW (ref 98–111)
Creatinine, Ser: 1.64 mg/dL — ABNORMAL HIGH (ref 0.44–1.00)
GFR, Estimated: 32 mL/min — ABNORMAL LOW (ref >60)
Glucose, Bld: 120 mg/dL — ABNORMAL HIGH (ref 70–99)
Potassium: 3.9 mmol/L (ref 3.5–5.1)
Sodium: 136 mmol/L (ref 135–145)

## 2021-02-12 MED ORDER — TORSEMIDE 20 MG PO TABS
40.0000 mg | ORAL_TABLET | Freq: Every day | ORAL | Status: DC
  Administered 2021-02-13 – 2021-02-22 (×10): 40 mg via ORAL
  Filled 2021-02-12 (×10): qty 2

## 2021-02-12 MED ORDER — TECHNETIUM TO 99M ALBUMIN AGGREGATED
4.4000 | Freq: Once | INTRAVENOUS | Status: AC
  Administered 2021-02-12: 4.4 via INTRAVENOUS

## 2021-02-12 MED ORDER — SALINE SPRAY 0.65 % NA SOLN
1.0000 | NASAL | Status: DC | PRN
  Filled 2021-02-12: qty 44

## 2021-02-12 MED ORDER — FUROSEMIDE 10 MG/ML IJ SOLN
40.0000 mg | Freq: Two times a day (BID) | INTRAMUSCULAR | Status: AC
  Administered 2021-02-12: 40 mg via INTRAVENOUS
  Filled 2021-02-12: qty 4

## 2021-02-12 NOTE — Progress Notes (Addendum)
Progress Note  Patient Name: Mandy [name] Date of Encounter: 02/12/2021  [facility] Cardiologist: Donato Heinz, MD   Subjective   Breathing better today. Refused BiPAP overnight. Had V/Q this AM.  Cr improving from 2.0>1.64 Co-ox 61.8 Underwent thoracentesis on 2/9 with 600cc fluid removed  Inpatient Medications    Scheduled Meds:  arformoterol  15 mcg Nebulization BID   atorvastatin  80 mg Oral QPM   Chlorhexidine Gluconate Cloth  6 each Topical Daily   clopidogrel  75 mg Oral Daily   fluticasone  1 spray Each Nare Daily   furosemide  40 mg Intravenous Q12H   heparin  5,000 Units Subcutaneous Q8H   mouth rinse  15 mL Mouth Rinse BID   melatonin  10 mg Oral QHS   methimazole  2.5 mg Oral Daily   pantoprazole  40 mg Oral q morning   revefenacin  175 mcg Nebulization Daily   sildenafil  20 mg Oral TID   sodium chloride flush  10-40 mL Intracatheter Q12H   sodium chloride flush  3 mL Intravenous Q12H   Continuous Infusions:   PRN Meds: acetaminophen **OR** acetaminophen, albuterol, alum & mag hydroxide-simeth, docusate sodium, hydrALAZINE, ondansetron (ZOFRAN) IV, sodium chloride flush   Vital Signs    Vitals:   02/12/21 0700 02/12/21 0742 02/12/21 0755 02/12/21 0827  BP: (!) 126/43     Pulse: 68     Resp: 17     Temp:  98.6 F (37 C)    TempSrc:  Oral    SpO2: 100%  100% 100%  Weight:      Height:        Intake/Output Summary (Last 24 hours) at 02/12/2021 1207 Last data filed at 02/12/2021 3818 Gross per 24 hour  Intake 20 ml  Output 1575 ml  Net -1555 ml    Last 3 Weights 02/12/2021 02/11/2021 02/10/2021  Weight (lbs) 161 lb 9.6 oz 160 lb 7.9 oz 163 lb 2.3 oz  Weight (kg) 73.3 kg 72.8 kg 74 kg      Telemetry    NSR - Personally Reviewed  ECG    No new tracing - Personally Reviewed  Physical Exam   GEN: Elderly female, somnolent but arousable  Neck: Prominent V-waves due to severe TR Cardiac: RRR, 2/6 systolic  murmur Respiratory: Crackles at the bases. Otherwise diminished throughout GI: Soft, nontender, non-distended  MS: No edema; No deformity. Neuro:  Grossly nonfocal Psych: Normal affect   Labs    High Sensitivity Troponin:   Recent Labs  Lab 02/03/21 0316 02/05/21 0933 02/05/21 1746 02/06/21 0854 02/06/21 1300  TROPONINIHS 59* 66* 65* 92* 85*      Chemistry Recent Labs  Lab 02/06/21 0918 02/06/21 1300 02/09/21 0302 02/09/21 0748 02/10/21 0636 02/11/21 1111 02/11/21 1627 02/12/21 0315  NA 137   < > 134* 134* 133* 135  --  136  K 2.8*   < > 4.4 4.0 3.8 2.9*  --  3.9  CL 81*   < > 81* 80* 81* 80*  --  83*  CO2 47*   < > 44* >45* >45* >45*  --  >45*  GLUCOSE 148*   < > 122* 138* 122* 189*  --  120*  BUN 39*   < > 63* 67* 69* 66*  --  53*  CREATININE 1.70*   < > 3.70* 3.39* 2.90* 2.00*  --  1.64*  CALCIUM 8.9   < > 8.5* 8.7* 8.5* 8.5*  --  8.4*  MG  --    < > 2.7*  --  2.8* 2.6*  --   --   PROT 6.6  --   --  6.3*  --   --   --   --   ALBUMIN 3.4*  --   --  3.2*  --   --  3.2*  --   AST 23  --   --  32  --   --   --   --   ALT 26  --   --  19  --   --   --   --   ALKPHOS 58  --   --  47  --   --   --   --   BILITOT 0.5  --   --  0.3  --   --   --   --   GFRNONAA 30*   < > 12* 13* 16* 25*  --  32*  ANIONGAP 9   < > 9 NOT CALCULATED NOT CALCULATED NOT CALCULATED  --   --    < > = values in this interval not displayed.     Lipids  No results for input(s): CHOL, TRIG, HDL, LABVLDL, LDLCALC, CHOLHDL in the last 168 hours.   Hematology Recent Labs  Lab 02/09/21 0302 02/10/21 0636 02/11/21 1111  WBC 6.8 5.5 5.8  RBC 2.99* 3.01* 3.20*  HGB 7.6* 7.7* 8.1*  HCT 24.5* 25.1* 26.5*  MCV 81.9 83.4 82.8  MCH 25.4* 25.6* 25.3*  MCHC 31.0 30.7 30.6  RDW 15.5 15.7* 15.3  PLT 218 240 270    Thyroid  No results for input(s): TSH, FREET4 in the last 168 hours.   BNP Recent Labs  Lab 02/06/21 0918  BNP 469.6*     DDimer No results for input(s): DDIMER in the last  168 hours.   Radiology    DG CHEST PORT 1 VIEW  Result Date: 02/11/2021 CLINICAL DATA:  Status post thoracentesis EXAM: PORTABLE CHEST 1 VIEW COMPARISON:  02/06/2021 FINDINGS: Right arm PICC tip in the SVC above the right atrium. Artifact overlies the chest. There are bilateral pleural effusions with dependent pulmonary atelectasis. Effusion on the right appears similar to the study of 5 days ago. Effusion on the left is larger. No sign of pneumothorax post procedure. No acute bone finding. IMPRESSION: No pneumothorax post procedure. Bilateral pleural effusions with dependent atelectasis. Effusion on the right is similar to the study of 5 days ago. Effusion on the left is larger. Electronically Signed   By: Nelson Chimes M.D.   On: 02/11/2021 16:55    Cardiac Studies   TTE 02/06/20: IMPRESSIONS   1. Severe pulmonary hypertension estimated PA systolic 423 mmHg.   2. Left ventricular ejection fraction, by estimation, is 60 to 65%. The  left ventricle has normal function. The left ventricle has no regional  wall motion abnormalities. There is severe left ventricular hypertrophy.  Left ventricular diastolic parameters   are consistent with Grade I diastolic dysfunction (impaired relaxation).   3. Right ventricular systolic function is moderately reduced. The right  ventricular size is moderately enlarged. There is severely elevated  pulmonary artery systolic pressure.   4. The pericardial effusion is posterior to the left ventricle.   5. The mitral valve is abnormal. Trivial mitral valve regurgitation. No  evidence of mitral stenosis.   6. Tricuspid valve regurgitation is severe.   7. The aortic valve is tricuspid. There is mild calcification of the  aortic  valve. Aortic valve regurgitation is not visualized. Aortic valve  sclerosis is present, with no evidence of aortic valve stenosis.   8. The inferior vena cava is dilated in size with <50% respiratory  variability, suggesting right atrial  pressure of 15 mmHg.   Patient Profile     81 y.o. 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 Has hx of prior Covid PNA with hypoxemia in 02/2019 as well as 06/2020. 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 Between 09/2020 and now has had multiple readmissions for recurrent a/c respiratory failure and pleural effusions. Admitted with same 02/03/21, transported from sleep center with hypoxia.    Assessment & Plan    #Acute on Chronic Hypoxic/Hypercapneic Respiratory Failure #Acute on Chronic Diastolic HF: #Severe Pulmonary HTN: #Bilateral Pleural Effusions: Patient presented after profound hypoxic event with SpO2 in 30s during sleep for about 66min. CXR notable for bilateral pleural effusions. BNP 670. Underwent thoracentesis and was placed on inotropic support to aide in diuresis with improvement. TTE on this admission with EF 60-65%, G1DD, severe pulmonary HTN with PASP 146mmHg. Now off milrinone and transitioned to sildenafil. Overall, poor prognosis but wishes to continue with full treatment as able per discussions with palliative -Continue sildenafil 20mg  TID -Off lasix gtt and transitioned to lasix 40mg  IV BID>>agree with transitioning to torsemide 40mg  PO daily tomorrow  -BiPAP management per CCM -Planned for V/Q today to evaluate for CTEPH -S/p thoracentesis on 2/9 with removal 600cc of fluid -Overall prognosis is very poor given severe pulmonary HTN; appreciate palliative input  #HTN: BP okay. Will trend. -Atenolol stopped due to AKI -Holding hydralazine  #AKI on CKD 3A: Improving with stopping lasix gtt. Now transitioned to intermittent lasix  dosing. Co-ox okay after stopping milrinone -Agree with changing lasix to torsemide 40mg  daily tomorrow  #History of cryptogenic stroke: -Continue plavix 75mg  daily -Continue lipitor 80mg  daily  #Nonobstructive CAD: -Continue lipitor 80mg  daily  #Chronic Anemia: -Stable; trend     For questions or updates, please contact [facility] Please consult [email or web address] for contact info under        Signed, Freada Bergeron, MD  02/12/2021, 12:07 PM

## 2021-02-12 NOTE — Progress Notes (Signed)
RA PICC removed per protocol per MD order as line was occluded and patient to be dc'd home tomorrow. Manual pressure applied for 5 mins. Vaseline gauze, gauze, and Tegaderm applied over insertion site. No bleeding or swelling noted. Instructed patient to remain in bed for thirty mins. Educated patient and her daughter about S/S of infection and when to call MD; no heavy lifting or pressure on right side for 24 hours; keep dressing dry and intact for 24 hours. [name] verbalized comprehension.

## 2021-02-12 NOTE — Progress Notes (Addendum)
NAME:  Mandy [name], MRN:  [id number], DOB:  [birth year], LOS: 9 ADMISSION DATE:  02/02/2021, CONSULTATION DATE:  02/05/21 REFERRING MD:  Dwyane Dee CHIEF COMPLAINT:  Hypercarbia   History of Present Illness:  Mandy [name] is a 81 y.o. F with PMH significant for severe pulmonary HTN, HTN, HL, DM, recent pleural effusions, goiter and thyroid disease and HFpEF who presented to the ED 2/1 after desaturating during  a sleep study to 30% for about 10 minutes.  She had increased pleural effusions on CXR and was requiring 4L O2, so admitted to telemetry and treated with IV Lasix.   On 2/2 she was increasingly dyspneic and ABG showed pH 7.3 with pCO2 of 77 and PCCM was consulted.    Pertinent  Medical History  Diabetes Mellitus GERD CKD IIIa Hypertension Hyperthyroidism Mild CAD Pulmonary Hypertension Stroke  Significant Hospital Events: Including procedures, antibiotic start and stop dates in addition to other pertinent events   2/1 admit to hospitalists 2/2 PCCM consult for hypercarbia 2/3 Patient had loss of consciousness with O2 desaturation with movement 2/5 milrinone dose titrated down, tolerated well 2/6 milrinone off, started sildenafil 2/9 thoracentesis 600cc, probably pseudoexudate in setting diuresis (exudative by protein only), cyto pending  Interim History / Subjective:   Thoracentesis yesterday with 600cc removed. Feels breathing 'better.'  Refused BiPAP overnight, mostly on 4L [place], stable.  Co-ox 61.8% Cr trending down  Objective   Blood pressure (!) 126/43, pulse 68, temperature 98.6 F (37 C), temperature source Oral, resp. rate 17, height 4\' 11"  (1.499 m), weight 73.3 kg, SpO2 100 %. CVP:  [11 mmHg] 11 mmHg  FiO2 (%):  [36 %] 36 %   Intake/Output Summary (Last 24 hours) at 02/12/2021 9678 Last data filed at 02/12/2021 0803 Gross per 24 hour  Intake 20 ml  Output 1575 ml  Net -1555 ml   Filed Weights   02/10/21 0500 02/11/21 0500 02/12/21 0500  Weight: 74 kg 72.8  kg 73.3 kg   Examination: General appearance: 81 y.o., female, NAD, conversant  Eyes: PERRL, tracking appropriately HENT: NCAT; MMM Lungs: CTAB, no crackles, no wheeze, with normal respiratory effort CV: RRR  Abdomen: Soft, non-tender; non-distended, BS present  Extremities: trace peripheral edema, warm Skin: Normal turgor and texture; no rash Neuro: alert, grossly nonfocal   Cr trending down Coox 62%  Resolved Hospital Problem list     Assessment & Plan:  Acute on Chronic Hypoxemic and hypercapnic Respiratory Failure Acute on Chronic Diastolic Feart Failure Pulmonary Hypertension Bilateral Pleural Effusions s/p R thoracentesis 2/9, likely pseudoexudate  - check V/Q scan eval for CTEPH although interpretation may be challenging in setting of her baseline abnormal cxr - Continue qHS and when sleeping during the day - Continue 40mg  lasix BID IV dosing - discuss transition to torsemide with cardiology - continue sildenafil 20mg  TID, unable to use tadalafil due to renal function - HIV screen negative, ANA negative, Anti-histone ab negative  - Continue LABA/LAMA nebulizer treatments via brovana + yupelri - PT/mobilize  Chest Pain Nonobstructive CAD - cath 11/2020 - Troponins not elevated, EKG non-ischemic - Continue lipitor 80mg  daily  AKI on CKD IIIa Hypokalemia Hypochloremia - Cr improving - monitor renal function and electrolytes  Hypertension - holding hydralazine and atenolol  History of cryptogenic stroke: -Continue plavix 75mg  daily -Continue lipitor 80mg  daily  Hyperthyroidism - on methimazole - TSH wnl  Anemia, Chronic normocytic - monitor  Best Practice (right click and "Reselect all SmartList Selections" daily)   Diet/type: Regular consistency (see  orders) DVT prophylaxis: prophylactic heparin  GI prophylaxis: N/A Lines: Central line - PICC Foley:  N/A Code Status:  full code Last date of multidisciplinary goals of care discussion [palliative  care has seen 2/5, reaffirmed full code, desire for full supportive measures in event of deterioration]  Critical care time: [place] for personal pager PCCM on call pager [phone] until 7pm. Please call Elink 7p-7a. [phone]

## 2021-02-12 NOTE — Progress Notes (Signed)
Patient's nurse, Candace reported that she was able to flush red lumen this am without any trouble, but that night shift reported having difficulty with purple lumen last night. VAST RN responded around 2130 and was able to obtain blood return from both lumens and saline locked both lumens according to documentation.  VAST RN this am attempted to change both caps and flush PICC without success. Changed dressing to ensure no kinking of PICC noted at insertion site beneath Biopatch (no kinking noted). Assessed both lumens a second time, but still unable to obtain blood return or flush.  Candace, unit RN reported patient may go home tomorrow. VAST RN was able to place an USGIV in left upper arm, which was needed for pulmonary study. Advised Candace to wait until study completed to determine next measures with PICC. If patient is likely to stay beyond tomorrow, VAST RN recommended that both lumens of PICC be TPA'd, otherwise PICC should be removed. Candace, unit RN to discuss with physician after pulmonary study completed.

## 2021-02-12 NOTE — Progress Notes (Signed)
Physical Therapy Treatment Patient Details Name: Mandy [name] MRN: [id number] DOB: 1940-12-09 Today's Date: 02/12/2021   History of Present Illness 81 yo female admitted with acute on chronic resp failure, acute on chronic HF, bil pleural effusion. Hx of anemia, severe pulm HTN, DM, HF, CVA, CAD, CKD. Pt is non English speaking-family interprets-hospital interpreter not available    PT Comments    Patient progressing well with mobility and reduced min assist required for bed mobility and transfers. Pt took small steps forward with min-mod assist to move to recliner. Pt required tactile cues for safety as she attempted to sit prior to being in front of chair. Pt fatigued and stated "I feel bad" at EOS. VSS and pt stated "I'm better" after ~3 minutes rest.    Recommendations for follow up therapy are one component of a multi-disciplinary discharge planning process, led by the attending physician.  Recommendations may be updated based on patient status, additional functional criteria and insurance authorization.  Follow Up Recommendations  Skilled nursing-short term rehab (<3 hours/day)     Assistance Recommended at Discharge Frequent or constant Supervision/Assistance  Patient can return home with the following Assist for transportation;Help with stairs or ramp for entrance;Assistance with cooking/housework;A lot of help with walking and/or transfers;A lot of help with bathing/dressing/bathroom   Equipment Recommendations  None recommended by PT    Recommendations for Other Services       Precautions / Restrictions Precautions Precautions: Fall Restrictions Weight Bearing Restrictions: No     Mobility  Bed Mobility Overal bed mobility: Needs Assistance Bed Mobility: Supine to Sit     Supine to sit: Min assist, HOB elevated     General bed mobility comments: pt able to use bed rail to pivot trunk and initiated bringing LE's off EOB.    Transfers Overall transfer level:  Needs assistance Equipment used: Rolling walker (2 wheels) Transfers: Sit to/from Stand Sit to Stand: Min assist, +2 safety/equipment           General transfer comment: Min assist for power up from EOB with RW, cues for safety needed, pt quick to attempt stand prior to RW being available and therapist being there to help.    Ambulation/Gait Ambulation/Gait assistance: Min assist, +2 safety/equipment Gait Distance (Feet): 5 Feet Assistive device: Rolling walker (2 wheels) Gait Pattern/deviations: Step-to pattern, Decreased step length - right, Decreased step length - left, Decreased stride length, Shuffle, Trunk flexed Gait velocity: decr     General Gait Details: pt taking small steps to move from bed to recliner. tactile cues for safety to prevent pt from reaching for chair before in safe position to sit.   Stairs             Wheelchair Mobility    Modified Rankin (Stroke Patients Only)       Balance                                            Cognition Arousal/Alertness: Awake/alert Behavior During Therapy: Flat affect Overall Cognitive Status: Difficult to assess                                          Exercises      General Comments        Pertinent Vitals/Pain  Pain Assessment Pain Assessment: Faces Faces Pain Scale: No hurt Pain Intervention(s): Monitored during session    Home Living                          Prior Function            PT Goals (current goals can now be found in the care plan section) Acute Rehab PT Goals PT Goal Formulation: Patient unable to participate in goal setting Time For Goal Achievement: 02/23/21 Potential to Achieve Goals: Good Progress towards PT goals: Progressing toward goals    Frequency    Min 3X/week      PT Plan Current plan remains appropriate    Co-evaluation              AM-PAC PT "6 Clicks" Mobility   Outcome Measure  Help needed  turning from your back to your side while in a flat bed without using bedrails?: A Little Help needed moving from lying on your back to sitting on the side of a flat bed without using bedrails?: A Little Help needed moving to and from a bed to a chair (including a wheelchair)?: A Little Help needed standing up from a chair using your arms (e.g., wheelchair or bedside chair)?: A Little Help needed to walk in hospital room?: A Little Help needed climbing 3-5 steps with a railing? : Total 6 Click Score: 16    End of Session Equipment Utilized During Treatment: Gait belt Activity Tolerance: Patient tolerated treatment well Patient left: in chair;with call bell/phone within reach;with chair alarm set;with family/visitor present Nurse Communication: Mobility status PT Visit Diagnosis: Muscle weakness (generalized) (M62.81);Difficulty in walking, not elsewhere classified (R26.2)     Time: 8280-0349 PT Time Calculation (min) (ACUTE ONLY): 16 min  Charges:  $Therapeutic Activity: 8-22 mins                     Mandy [name], DPT Acute Rehabilitation Services Office [phone] Pager [phone]    Mandy [name] 02/12/2021, 2:47 PM

## 2021-02-13 ENCOUNTER — Inpatient Hospital Stay (HOSPITAL_COMMUNITY): Payer: Medicare HMO

## 2021-02-13 DIAGNOSIS — J9621 Acute and chronic respiratory failure with hypoxia: Secondary | ICD-10-CM | POA: Diagnosis not present

## 2021-02-13 DIAGNOSIS — I272 Pulmonary hypertension, unspecified: Secondary | ICD-10-CM | POA: Diagnosis not present

## 2021-02-13 DIAGNOSIS — J9622 Acute and chronic respiratory failure with hypercapnia: Secondary | ICD-10-CM | POA: Diagnosis not present

## 2021-02-13 DIAGNOSIS — I509 Heart failure, unspecified: Secondary | ICD-10-CM | POA: Diagnosis not present

## 2021-02-13 LAB — BASIC METABOLIC PANEL
BUN: 49 mg/dL — ABNORMAL HIGH (ref 8–23)
CO2: >45 mmol/L — ABNORMAL HIGH (ref 22–32)
Calcium: 8.5 mg/dL — ABNORMAL LOW (ref 8.9–10.3)
Chloride: 84 mmol/L — ABNORMAL LOW (ref 98–111)
Creatinine, Ser: 1.79 mg/dL — ABNORMAL HIGH (ref 0.44–1.00)
GFR, Estimated: 28 mL/min — ABNORMAL LOW (ref >60)
Glucose, Bld: 111 mg/dL — ABNORMAL HIGH (ref 70–99)
Potassium: 3.9 mmol/L (ref 3.5–5.1)
Sodium: 138 mmol/L (ref 135–145)

## 2021-02-13 LAB — COOXEMETRY PANEL
Carboxyhemoglobin: 1.8 % — ABNORMAL HIGH (ref 0.5–1.5)
Methemoglobin: 1.3 % (ref 0.0–1.5)
O2 Saturation: 77.8 %
Total hemoglobin: 8.4 g/dL — ABNORMAL LOW (ref 12.0–16.0)

## 2021-02-13 LAB — TROPONIN I (HIGH SENSITIVITY)
Troponin I (High Sensitivity): 1232 ng/L (ref <18)
Troponin I (High Sensitivity): 1166 ng/L (ref <18)

## 2021-02-13 MED ORDER — TRAMADOL HCL 50 MG PO TABS
50.0000 mg | ORAL_TABLET | Freq: Four times a day (QID) | ORAL | Status: DC | PRN
  Administered 2021-02-13 – 2021-02-22 (×3): 50 mg via ORAL
  Filled 2021-02-13 (×3): qty 1

## 2021-02-13 MED ORDER — METOPROLOL TARTRATE 5 MG/5ML IV SOLN
5.0000 mg | Freq: Once | INTRAVENOUS | Status: AC
  Administered 2021-02-13: 5 mg via INTRAVENOUS

## 2021-02-13 MED ORDER — HYDRALAZINE HCL 25 MG PO TABS
25.0000 mg | ORAL_TABLET | Freq: Three times a day (TID) | ORAL | Status: DC
  Administered 2021-02-13 – 2021-02-22 (×28): 25 mg via ORAL
  Filled 2021-02-13 (×29): qty 1

## 2021-02-13 MED ORDER — HEPARIN (PORCINE) 25000 UT/250ML-% IV SOLN
900.0000 [IU]/h | INTRAVENOUS | Status: DC
  Administered 2021-02-13: 900 [IU]/h via INTRAVENOUS
  Filled 2021-02-13: qty 250

## 2021-02-13 MED ORDER — GABAPENTIN 100 MG PO CAPS
100.0000 mg | ORAL_CAPSULE | Freq: Every day | ORAL | Status: DC
  Administered 2021-02-13 – 2021-02-21 (×9): 100 mg via ORAL
  Filled 2021-02-13 (×9): qty 1

## 2021-02-13 MED ORDER — MORPHINE SULFATE (PF) 2 MG/ML IV SOLN
2.0000 mg | Freq: Once | INTRAVENOUS | Status: AC
  Administered 2021-02-13: 2 mg via INTRAVENOUS
  Filled 2021-02-13: qty 1

## 2021-02-13 MED ORDER — METOPROLOL TARTRATE 5 MG/5ML IV SOLN
INTRAVENOUS | Status: AC
  Filled 2021-02-13: qty 5

## 2021-02-13 MED ORDER — CARVEDILOL 3.125 MG PO TABS
3.1250 mg | ORAL_TABLET | Freq: Two times a day (BID) | ORAL | Status: DC
  Administered 2021-02-13: 3.125 mg via ORAL
  Filled 2021-02-13: qty 1

## 2021-02-13 MED ORDER — HEPARIN BOLUS VIA INFUSION
2000.0000 [IU] | Freq: Once | INTRAVENOUS | Status: AC
  Administered 2021-02-13: 2000 [IU] via INTRAVENOUS
  Filled 2021-02-13: qty 2000

## 2021-02-13 MED ORDER — POTASSIUM CHLORIDE CRYS ER 20 MEQ PO TBCR
40.0000 meq | EXTENDED_RELEASE_TABLET | Freq: Once | ORAL | Status: AC
  Administered 2021-02-13: 40 meq via ORAL
  Filled 2021-02-13: qty 2

## 2021-02-13 NOTE — Progress Notes (Signed)
[place] for heparin Indication: chest pain/ACS  No Known Allergies  Patient Measurements: Height: 4\' 11"  (149.9 cm) Weight: 69.3 kg (152 lb 12.5 oz) IBW/kg (Calculated) : 43.2 Heparin Dosing Weight: 69 kg  Vital Signs: Temp: 97.9 F (36.6 C) (02/11 1236) Temp Source: Axillary (02/11 1236) BP: 125/48 (02/11 1406) Pulse Rate: 89 (02/11 1300)  Labs: Recent Labs    02/11/21 1111 02/12/21 0315 02/13/21 0257 02/13/21 1522  HGB 8.1*  --   --   --   HCT 26.5*  --   --   --   PLT 270  --   --   --   CREATININE 2.00* 1.64* 1.79*  --   TROPONINIHS  --   --   --  1,232*    Estimated Creatinine Clearance: 21.6 mL/min (A) (by C-G formula based on SCr of 1.79 mg/dL (H)).   Assessment: Patient's a 81 y.o F with hx HF, pulmonary HTN, chronic hypoxic and hypercarbic respiratory failure, CKD presented to the ED on 02/02/21 fwith c/o SOB. She developed CP on 02/13/21 with elevated troponin. Pharmacy has been consulted to dose heparin for ACS.  Today, 02/13/2021: - Troponin 1232 - scr 1.79 - last cbc on 2/9: hgb 8.1, plts 270K - pt received heparin 5000 units SQ at 2pm  Goal of Therapy:  Heparin level 0.3-0.7 units/ml Monitor platelets by anticoagulation protocol: Yes   Plan:  - d/c hep SQ - will give a small heparin bolus of 2000 units IV  x1 since pt recently got the heparin SQ dose for VTE prophylaxis at 2p, then start drip at 900 units/hr  - check 8 hr heparin level - monitor for s/sx bleeding  Lynelle Doctor 02/13/2021,4:26 PM

## 2021-02-13 NOTE — Progress Notes (Signed)
02/13/2021 Patient placed on BiPAP by respiratory therapist around 11pm. Patient removed BiPAP mask after about 30 minutes and refused to allow it to be replaced. Meanwhile, O2 sats dropped to mid-70s. Told patient that she must have oxygen in place, but patient continued to refuse to have mask on. Nasal cannula placed at 4L/min with gradual improvement in O2 sats to mid-90s. Patient then removed [place] x 2 for extended periods to blow her nose, again with drop in O2 sats to upper 70s and low 80s. Stressed to patient that she must have an oxygen source in place. Offered saline nasal spray for dry nasal passages/irritation but patient declined. Subsequently went to sleep with improved O2 sats to upper 90s. Cindy S. Brigitte Pulse BSN, RN, CCRP 02/13/2021 1:24 AM

## 2021-02-13 NOTE — Plan of Care (Signed)
°  Problem: Education: Goal: Ability to demonstrate management of disease process will improve Outcome: Progressing Goal: Ability to verbalize understanding of medication therapies will improve Outcome: Progressing   Problem: Activity: Goal: Capacity to carry out activities will improve Outcome: Progressing   Problem: Cardiac: Goal: Ability to achieve and maintain adequate cardiopulmonary perfusion will improve Outcome: Progressing   Problem: Education: Goal: Knowledge of General Education information will improve Description: Including pain rating scale, medication(s)/side effects and non-pharmacologic comfort measures Outcome: Progressing   Problem: Health Behavior/Discharge Planning: Goal: Ability to manage health-related needs will improve Outcome: Progressing   Problem: Clinical Measurements: Goal: Ability to maintain clinical measurements within normal limits will improve Outcome: Progressing Goal: Will remain free from infection Outcome: Progressing Goal: Diagnostic test results will improve Outcome: Progressing Goal: Respiratory complications will improve Outcome: Progressing Goal: Cardiovascular complication will be avoided Outcome: Progressing   Problem: Activity: Goal: Risk for activity intolerance will decrease Outcome: Progressing   Problem: Nutrition: Goal: Adequate nutrition will be maintained Outcome: Progressing   Problem: Coping: Goal: Level of anxiety will decrease Outcome: Progressing   Problem: Elimination: Goal: Will not experience complications related to bowel motility Outcome: Progressing Goal: Will not experience complications related to urinary retention Outcome: Progressing   Problem: Pain Managment: Goal: General experience of comfort will improve Outcome: Progressing   Problem: Safety: Goal: Ability to remain free from injury will improve Outcome: Progressing   Problem: Skin Integrity: Goal: Risk for impaired skin integrity  will decrease Outcome: Progressing   Cindy S. Brigitte Pulse BSN, RN, CCRP 02/13/2021 1:16 AM

## 2021-02-13 NOTE — Progress Notes (Signed)
Pt complaining of intermittent chest pain. At 1455 patients heart rate jumped up to 140's and she was complaining of chest pain. EKG completed. Contacted Dr. Johney Frame with cardiology. 5mg  metoprolol given. 2mg  of morphine given IV. Troponin collected and chest xray to be done. Pts current heart rate has now came down to 91.

## 2021-02-13 NOTE — Progress Notes (Signed)
02/13/2021 Patient refusing administration of SQ heparin this morning. Explained reason for administering, however, patient will not allow injection to be given. E-link notified of above. Cindy S. Brigitte Pulse BSN, RN, [facility] 02/13/2021 6:16 AM

## 2021-02-13 NOTE — Progress Notes (Addendum)
NAME:  Mandy [name], MRN:  [id number], DOB:  [birth year], LOS: 28 ADMISSION DATE:  02/02/2021, CONSULTATION DATE:  02/05/21 REFERRING MD:  Dwyane Dee CHIEF COMPLAINT:  Hypercarbia   History of Present Illness:  Mandy [name] is a 81 y.o. F with PMH significant for severe pulmonary HTN, HTN, HL, DM, recent pleural effusions, goiter and thyroid disease and HFpEF who presented to the ED 2/1 after desaturating during  a sleep study to 30% for about 10 minutes.  She had increased pleural effusions on CXR and was requiring 4L O2, so admitted to telemetry and treated with IV Lasix.   On 2/2 she was increasingly dyspneic and ABG showed pH 7.3 with pCO2 of 77 and PCCM was consulted.    Pertinent  Medical History  Diabetes Mellitus GERD CKD IIIa Hypertension Hyperthyroidism Mild CAD Pulmonary Hypertension Stroke  Significant Hospital Events: Including procedures, antibiotic start and stop dates in addition to other pertinent events   2/1 admit to hospitalists 2/2 PCCM consult for hypercarbia 2/3 Patient had loss of consciousness with O2 desaturation with movement 2/5 milrinone dose titrated down, tolerated well 2/6 milrinone off, started sildenafil 2/9 thoracentesis 600cc, probably pseudoexudate in setting diuresis (exudative by protein only), cyto pending  Interim History / Subjective:  Refused BIPAP again. O2 needs down to 3LPM but desats to 70s when she takes it off, which appears to be fairly often.  Objective   Blood pressure (!) 160/67, pulse 85, temperature 98.2 F (36.8 C), temperature source Axillary, resp. rate (!) 28, height 4\' 11"  (1.499 m), weight 69.3 kg, SpO2 99 %.        Intake/Output Summary (Last 24 hours) at 02/13/2021 0840 Last data filed at 02/13/2021 0543 Gross per 24 hour  Intake 570 ml  Output 1200 ml  Net -630 ml    Filed Weights   02/11/21 0500 02/12/21 0500 02/13/21 0500  Weight: 72.8 kg 73.3 kg 69.3 kg   Examination: No acute distress MMM, trachea  midline Heart sounds regular, ext warm Diminished breath sounds at bases, mildly tachypneic Ext with trace edema  Cr trending down [place] 62%  Resolved Hospital Problem list     Assessment & Plan:  Acute on Chronic Hypoxemic and hypercapnic Respiratory Failure- improved, refusing BIPAP so will not pursue for home Acute on Chronic Diastolic Heart Failure- improved volume status and O2 needs Pulmonary Hypertension- groups 2/3.  VQ benign. Bilateral Pleural Effusions s/p R thoracentesis 2/9, likely pseudoexudate; chronic History of cryptogenic stroke, CAD, HTN  - Continue revatio - Diuretics to torsemide 40mg  daily - DC qHS BIPAP since she is refusing anyway - Encourage IS, [place] to maintain sats > 90%, currently on 2LPM with sats 98% - PO hydralazine and coreg - Continue statin, plavix, methimazole - Awaiting PT/OT input - Since not using BIPAP okay to transfer to floor with continuous pulse ox; appreciate TRH taking over starting 2/12, remaining issues are O2 wean, stable diuretic regimen, and PT/OT inputs.  Called daughter and let her know Monday would probably be earliest patient can go home. - Will arrange f/u with Dr. Silas Flood in 2-3 weeks  Best Practice (right click and "Reselect all SmartList Selections" daily)   Diet/type: Regular consistency (see orders) DVT prophylaxis: prophylactic heparin  GI prophylaxis: N/A Lines: Central line - PICC Foley:  N/A Code Status:  full code Last date of multidisciplinary goals of care discussion [palliative care has seen 2/5, reaffirmed full code, desire for full supportive measures in event of deterioration]  Erskine Emery MD PCCM  See Amion for personal pager PCCM on call pager [phone] until 7pm. Please call Elink 7p-7a. [phone]

## 2021-02-13 NOTE — Progress Notes (Addendum)
Progress Note  Patient Name: Tanicka Bisaillon Date of Encounter: 02/14/2021  [facility] Cardiologist: Donato Heinz, MD   Subjective   Developed chest pain yesterday with ECG revealing sinus tachycardia with TWI inferiorly and mild STD in anterolateral leads which is overall relatively unchanged from prior. Trop 3888>2800. Last cath 11/2020 with very mild disease. Was started on heparin gtt and echo pending. Given severe pulmonary hypertension and poor prognosis, will manage medically at this time.  States she has mild chest pain this morning.   Inpatient Medications    Scheduled Meds:  arformoterol  15 mcg Nebulization BID   atorvastatin  80 mg Oral QPM   carvedilol  3.125 mg Oral BID WC   Chlorhexidine Gluconate Cloth  6 each Topical Daily   clopidogrel  75 mg Oral Daily   fluticasone  1 spray Each Nare Daily   gabapentin  100 mg Oral QHS   hydrALAZINE  25 mg Oral Q8H   mouth rinse  15 mL Mouth Rinse BID   melatonin  10 mg Oral QHS   methimazole  2.5 mg Oral Daily   pantoprazole  40 mg Oral q morning   revefenacin  175 mcg Nebulization Daily   sildenafil  20 mg Oral TID   sodium chloride flush  3 mL Intravenous Q12H   torsemide  40 mg Oral Daily   Continuous Infusions:  heparin 900 Units/hr (02/14/21 0400)    PRN Meds: acetaminophen **OR** acetaminophen, albuterol, alum & mag hydroxide-simeth, docusate sodium, hydrALAZINE, ondansetron (ZOFRAN) IV, sodium chloride, traMADol   Vital Signs    Vitals:   02/14/21 0449 02/14/21 0500 02/14/21 0600 02/14/21 0700  BP:  (!) 147/49 (!) 130/49 (!) 153/60  Pulse:      Resp:  (!) 36 (!) 21 17  Temp: 98.2 F (36.8 C)     TempSrc: Axillary     SpO2:      Weight:  70.2 kg    Height:        Intake/Output Summary (Last 24 hours) at 02/14/2021 0746 Last data filed at 02/14/2021 0600 Gross per 24 hour  Intake 270.86 ml  Output 1100 ml  Net -829.14 ml   Last 3 Weights 02/14/2021 02/13/2021 02/12/2021  Weight (lbs)  154 lb 12.2 oz 152 lb 12.5 oz 161 lb 9.6 oz  Weight (kg) 70.2 kg 69.3 kg 73.3 kg      Telemetry    NSR - Personally Reviewed  ECG    Sinus tachycardia, TWI inferiorly, subtle STD anterolaterally, prolonged Qtc (relatively unchanged from prior) - Personally Reviewed  Physical Exam   GEN: Elderly female, sleeping in bed, comfortable Neck: Prominent V-waves due to severe TR Cardiac: RRR, 2/6 systolic murmur Respiratory: Diminished throughout GI: Soft, nontender, non-distended  MS: No edema; No deformity. Neuro:  Grossly nonfocal Psych: Normal affect   Labs    High Sensitivity Troponin:   Recent Labs  Lab 02/05/21 1746 02/06/21 0854 02/06/21 1300 02/13/21 1522 02/13/21 1721  TROPONINIHS 65* 92* 85* 1,232* 1,166*     Chemistry Recent Labs  Lab 02/09/21 0302 02/09/21 0748 02/10/21 0636 02/11/21 1111 02/11/21 1627 02/12/21 0315 02/13/21 0257 02/14/21 0643  NA 134* 134* 133* 135  --  136 138 136  K 4.4 4.0 3.8 2.9*  --  3.9 3.9 3.7  CL 81* 80* 81* 80*  --  83* 84* 85*  CO2 44* >45* >45* >45*  --  >45* >45* 43*  GLUCOSE 122* 138* 122* 189*  --  120*  111* 109*  BUN 63* 67* 69* 66*  --  53* 49* 35*  CREATININE 3.70* 3.39* 2.90* 2.00*  --  1.64* 1.79* 1.29*  CALCIUM 8.5* 8.7* 8.5* 8.5*  --  8.4* 8.5* 8.8*  MG 2.7*  --  2.8* 2.6*  --   --   --   --   PROT  --  6.3*  --   --   --   --   --   --   ALBUMIN  --  3.2*  --   --  3.2*  --   --   --   AST  --  32  --   --   --   --   --   --   ALT  --  19  --   --   --   --   --   --   ALKPHOS  --  47  --   --   --   --   --   --   BILITOT  --  0.3  --   --   --   --   --   --   GFRNONAA 12* 13* 16* 25*  --  32* 28* 42*  ANIONGAP 9 NOT CALCULATED NOT CALCULATED NOT CALCULATED  --   --  NOT CALCULATED 8    Lipids  No results for input(s): CHOL, TRIG, HDL, LABVLDL, LDLCALC, CHOLHDL in the last 168 hours.   Hematology Recent Labs  Lab 02/10/21 0636 02/11/21 1111 02/14/21 0643  WBC 5.5 5.8 6.1  RBC 3.01* 3.20* 3.35*   HGB 7.7* 8.1* 8.4*  HCT 25.1* 26.5* 27.3*  MCV 83.4 82.8 81.5  MCH 25.6* 25.3* 25.1*  MCHC 30.7 30.6 30.8  RDW 15.7* 15.3 15.7*  PLT 240 270 323   Thyroid  No results for input(s): TSH, FREET4 in the last 168 hours.   BNP No results for input(s): BNP, PROBNP in the last 168 hours.   DDimer No results for input(s): DDIMER in the last 168 hours.   Radiology    NM Pulmonary Perfusion  Result Date: 02/12/2021 CLINICAL DATA:  Short of breath, concern for pulmonary embolism. EXAM: NUCLEAR MEDICINE PERFUSION LUNG SCAN TECHNIQUE: Perfusion images were obtained in multiple projections after intravenous injection of radiopharmaceutical. RADIOPHARMACEUTICALS:  4.4 mCi Tc-58m MAA COMPARISON:  Chest radiograph 02/11/2021 CT chest 12/14/2020 FINDINGS: Decreased regional perfusion to the RIGHT lower lobe corresponds to loculated effusion on comparison radiograph and CT. No wedge-shaped peripheral perfusion defects to localize acute pulmonary embolism. IMPRESSION: 1. No evidence acute pulmonary embolism. 2. Decreased perfusion RIGHT lower lobe related to large loculated pleural effusion. Electronically Signed   By: Suzy Bouchard M.D.   On: 02/12/2021 14:20   DG CHEST PORT 1 VIEW  Result Date: 02/13/2021 CLINICAL DATA:  Increased shortness of breath. EXAM: PORTABLE CHEST 1 VIEW COMPARISON:  02/11/2021 FINDINGS: Unchanged cardiomediastinal contours. No significant change in the appearance of bilateral pleural effusions with mild diffuse interstitial edema. Areas of atelectasis are noted bilaterally, left greater than right. IMPRESSION: No change in aeration to the lungs compared with previous exam. Electronically Signed   By: Kerby Moors M.D.   On: 02/13/2021 16:27    Cardiac Studies   TTE 02/06/20: IMPRESSIONS   1. Severe pulmonary hypertension estimated PA systolic 825 mmHg.   2. Left ventricular ejection fraction, by estimation, is 60 to 65%. The  left ventricle has normal function. The left  ventricle has no regional  wall motion  abnormalities. There is severe left ventricular hypertrophy.  Left ventricular diastolic parameters   are consistent with Grade I diastolic dysfunction (impaired relaxation).   3. Right ventricular systolic function is moderately reduced. The right  ventricular size is moderately enlarged. There is severely elevated  pulmonary artery systolic pressure.   4. The pericardial effusion is posterior to the left ventricle.   5. The mitral valve is abnormal. Trivial mitral valve regurgitation. No  evidence of mitral stenosis.   6. Tricuspid valve regurgitation is severe.   7. The aortic valve is tricuspid. There is mild calcification of the  aortic valve. Aortic valve regurgitation is not visualized. Aortic valve  sclerosis is present, with no evidence of aortic valve stenosis.   8. The inferior vena cava is dilated in size with <50% respiratory  variability, suggesting right atrial pressure of 15 mmHg.   Patient Profile     81 y.o. 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 Has hx of prior Covid PNA with hypoxemia in 02/2019 as well as 06/2020. 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 Between 09/2020 and now has had multiple readmissions for recurrent a/c respiratory failure and pleural effusions. Admitted with same 02/03/21, transported from sleep center with hypoxia.    Assessment & Plan    #Chest Pain: #NSTEMI: Patient with episode of severe substernal chest pain yesterday afternoon. ECG with sinus tachycardia, TWI in inferior leads, mild STD in anterolateral leads, prolonged Qtc which was not significantly different from  priors. Trop 6270>3500. Recent cath 11/2020 with 30% LAD disease and no obstructive disease. While symptoms and trop elevation are concerning for possible ischemic event, her recent cath showed very mild disease. She has been initiated on heparin and echo is pending. Overall, she is not a great cath candidate due to severe pulmonary hypertension with frequent hospital admissions. Would favor medical management. -Continue heparin gtt x48 hours -Continue plavix 75mg  daily -Increase coreg to 6.25mg  BID -Continue lipitor 80mg  daily -Follow-up limited TTE -Overall, not a great cath candidate due to severe pulmonary HTN and overall poor prognosis with frequent hospital readmissions  #Acute on Chronic Hypoxic/Hypercapneic Respiratory Failure #Acute on Chronic Diastolic HF: #Severe Pulmonary HTN WHO Group II/III: #Bilateral Pleural Effusions: Patient presented after profound hypoxic event with SpO2 in 30s during sleep for about 37min. CXR notable for bilateral pleural effusions. BNP 670. Underwent thoracentesis and was placed on inotropic support to aide in diuresis with improvement. TTE on this admission with EF 60-65%, G1DD, severe pulmonary HTN with PASP 167mmHg. Now off milrinone and transitioned to sildenafil. V/Q scan negative for CTEPH. -Continue sildenafil 20mg  TID -Transition to torsemide 40mg  daily -V/Q scan negative for CTEPH -Has refused BiPAP; now on [place] -S/p thoracentesis on 2/9 with removal 600cc of fluid  #HTN: Elevated this AM -Continue hydralazine 25mg  TID -Increase coreg to 6.25mg  BID  #AKI on CKD 3A: Improved. Now on PO torsemide -Continue torsemide 40mg  daily  #History of cryptogenic stroke: -Continue plavix 75mg  daily -Continue lipitor 80mg  daily  #Nonobstructive CAD: -Continue lipitor 80mg  daily  #Chronic Anemia: -Stable; trend     For questions or updates, please contact [facility] Please consult [email or web address] for contact info under         Signed, [employer]  Renae Fickle, MD  02/14/2021, 7:46 AM

## 2021-02-13 NOTE — Progress Notes (Signed)
Progress Note  Patient Name: Mandy [name] Date of Encounter: 02/13/2021  [facility] Cardiologist: Donato Heinz, MD   Subjective   Had chest pain this morning. ECG with inferior TWI and diffuse T-wave flattening unchanged from prior. Cath 11/2020 without obstructive disease.   Transitioning to torsemide today Slight bump in Cr to 1.7 overnight Blood pressures elevated Co-ox 77  Inpatient Medications    Scheduled Meds:  arformoterol  15 mcg Nebulization BID   atorvastatin  80 mg Oral QPM   Chlorhexidine Gluconate Cloth  6 each Topical Daily   clopidogrel  75 mg Oral Daily   fluticasone  1 spray Each Nare Daily   heparin  5,000 Units Subcutaneous Q8H   mouth rinse  15 mL Mouth Rinse BID   melatonin  10 mg Oral QHS   methimazole  2.5 mg Oral Daily   pantoprazole  40 mg Oral q morning   revefenacin  175 mcg Nebulization Daily   sildenafil  20 mg Oral TID   sodium chloride flush  3 mL Intravenous Q12H   torsemide  40 mg Oral Daily   Continuous Infusions:   PRN Meds: acetaminophen **OR** acetaminophen, albuterol, alum & mag hydroxide-simeth, docusate sodium, hydrALAZINE, ondansetron (ZOFRAN) IV, sodium chloride   Vital Signs    Vitals:   02/13/21 0400 02/13/21 0408 02/13/21 0500 02/13/21 0600  BP: (!) 135/40  (!) 150/46 (!) 166/51  Pulse:      Resp: (!) 21  20 (!) 25  Temp:  98 F (36.7 C)    TempSrc:  Axillary    SpO2: 98%  99%   Weight:   69.3 kg   Height:        Intake/Output Summary (Last 24 hours) at 02/13/2021 0645 Last data filed at 02/13/2021 0543 Gross per 24 hour  Intake 695 ml  Output 1500 ml  Net -805 ml    Last 3 Weights 02/13/2021 02/12/2021 02/11/2021  Weight (lbs) 152 lb 12.5 oz 161 lb 9.6 oz 160 lb 7.9 oz  Weight (kg) 69.3 kg 73.3 kg 72.8 kg      Telemetry    NSR - Personally Reviewed  ECG    NSR, TWI inferiorly, diffuse t-wave flattening - Personally Reviewed  Physical Exam   GEN: Elderly female, sitting up in  bed Neck: Prominent V-waves due to severe TR Cardiac: RRR, 2/6 systolic murmur Respiratory: Diminished throughout GI: Soft, nontender, non-distended  MS: No edema; No deformity. Neuro:  Grossly nonfocal Psych: Normal affect   Labs    High Sensitivity Troponin:   Recent Labs  Lab 02/03/21 0316 02/05/21 0933 02/05/21 1746 02/06/21 0854 02/06/21 1300  TROPONINIHS 59* 66* 65* 92* 85*      Chemistry Recent Labs  Lab 02/06/21 0918 02/06/21 1300 02/09/21 0302 02/09/21 0748 02/10/21 0636 02/11/21 1111 02/11/21 1627 02/12/21 0315 02/13/21 0257  NA 137   < > 134* 134* 133* 135  --  136 138  K 2.8*   < > 4.4 4.0 3.8 2.9*  --  3.9 3.9  CL 81*   < > 81* 80* 81* 80*  --  83* 84*  CO2 47*   < > 44* >45* >45* >45*  --  >45* >45*  GLUCOSE 148*   < > 122* 138* 122* 189*  --  120* 111*  BUN 39*   < > 63* 67* 69* 66*  --  53* 49*  CREATININE 1.70*   < > 3.70* 3.39* 2.90* 2.00*  --  1.64* 1.79*  CALCIUM 8.9   < > 8.5* 8.7* 8.5* 8.5*  --  8.4* 8.5*  MG  --    < > 2.7*  --  2.8* 2.6*  --   --   --   PROT 6.6  --   --  6.3*  --   --   --   --   --   ALBUMIN 3.4*  --   --  3.2*  --   --  3.2*  --   --   AST 23  --   --  32  --   --   --   --   --   ALT 26  --   --  19  --   --   --   --   --   ALKPHOS 58  --   --  47  --   --   --   --   --   BILITOT 0.5  --   --  0.3  --   --   --   --   --   GFRNONAA 30*   < > 12* 13* 16* 25*  --  32* 28*  ANIONGAP 9   < > 9 NOT CALCULATED NOT CALCULATED NOT CALCULATED  --   --  NOT CALCULATED   < > = values in this interval not displayed.     Lipids  No results for input(s): CHOL, TRIG, HDL, LABVLDL, LDLCALC, CHOLHDL in the last 168 hours.   Hematology Recent Labs  Lab 02/09/21 0302 02/10/21 0636 02/11/21 1111  WBC 6.8 5.5 5.8  RBC 2.99* 3.01* 3.20*  HGB 7.6* 7.7* 8.1*  HCT 24.5* 25.1* 26.5*  MCV 81.9 83.4 82.8  MCH 25.4* 25.6* 25.3*  MCHC 31.0 30.7 30.6  RDW 15.5 15.7* 15.3  PLT 218 240 270    Thyroid  No results for input(s):  TSH, FREET4 in the last 168 hours.   BNP Recent Labs  Lab 02/06/21 0918  BNP 469.6*     DDimer No results for input(s): DDIMER in the last 168 hours.   Radiology    NM Pulmonary Perfusion  Result Date: 02/12/2021 CLINICAL DATA:  Short of breath, concern for pulmonary embolism. EXAM: NUCLEAR MEDICINE PERFUSION LUNG SCAN TECHNIQUE: Perfusion images were obtained in multiple projections after intravenous injection of radiopharmaceutical. RADIOPHARMACEUTICALS:  4.4 mCi Tc-64m MAA COMPARISON:  Chest radiograph 02/11/2021 CT chest 12/14/2020 FINDINGS: Decreased regional perfusion to the RIGHT lower lobe corresponds to loculated effusion on comparison radiograph and CT. No wedge-shaped peripheral perfusion defects to localize acute pulmonary embolism. IMPRESSION: 1. No evidence acute pulmonary embolism. 2. Decreased perfusion RIGHT lower lobe related to large loculated pleural effusion. Electronically Signed   By: Suzy Bouchard M.D.   On: 02/12/2021 14:20   DG CHEST PORT 1 VIEW  Result Date: 02/11/2021 CLINICAL DATA:  Status post thoracentesis EXAM: PORTABLE CHEST 1 VIEW COMPARISON:  02/06/2021 FINDINGS: Right arm PICC tip in the SVC above the right atrium. Artifact overlies the chest. There are bilateral pleural effusions with dependent pulmonary atelectasis. Effusion on the right appears similar to the study of 5 days ago. Effusion on the left is larger. No sign of pneumothorax post procedure. No acute bone finding. IMPRESSION: No pneumothorax post procedure. Bilateral pleural effusions with dependent atelectasis. Effusion on the right is similar to the study of 5 days ago. Effusion on the left is larger. Electronically Signed   By: Nelson Chimes M.D.   On: 02/11/2021 16:55  Cardiac Studies   TTE 02/06/20: IMPRESSIONS   1. Severe pulmonary hypertension estimated PA systolic 916 mmHg.   2. Left ventricular ejection fraction, by estimation, is 60 to 65%. The  left ventricle has normal  function. The left ventricle has no regional  wall motion abnormalities. There is severe left ventricular hypertrophy.  Left ventricular diastolic parameters   are consistent with Grade I diastolic dysfunction (impaired relaxation).   3. Right ventricular systolic function is moderately reduced. The right  ventricular size is moderately enlarged. There is severely elevated  pulmonary artery systolic pressure.   4. The pericardial effusion is posterior to the left ventricle.   5. The mitral valve is abnormal. Trivial mitral valve regurgitation. No  evidence of mitral stenosis.   6. Tricuspid valve regurgitation is severe.   7. The aortic valve is tricuspid. There is mild calcification of the  aortic valve. Aortic valve regurgitation is not visualized. Aortic valve  sclerosis is present, with no evidence of aortic valve stenosis.   8. The inferior vena cava is dilated in size with <50% respiratory  variability, suggesting right atrial pressure of 15 mmHg.   Patient Profile     81 y.o. 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 Has hx of prior Covid PNA with hypoxemia in 02/2019 as well as 06/2020. 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 Between 09/2020 and now has had multiple readmissions for recurrent a/c respiratory failure and pleural effusions. Admitted with same 02/03/21, transported from sleep center with hypoxia.    Assessment & Plan    #Acute on Chronic Hypoxic/Hypercapneic Respiratory Failure #Acute on Chronic Diastolic HF: #Severe Pulmonary HTN: #Bilateral Pleural Effusions: Patient presented after profound hypoxic event with SpO2 in 30s during  sleep for about 31min. CXR notable for bilateral pleural effusions. BNP 670. Underwent thoracentesis and was placed on inotropic support to aide in diuresis with improvement. TTE on this admission with EF 60-65%, G1DD, severe pulmonary HTN with PASP 179mmHg. Now off milrinone and transitioned to sildenafil. V/Q scan negative for CTEPH. -Continue sildenafil 20mg  TID -Transition to torsemide 40mg  daily -V/Q scan negative for CTEPH -Refusing BiPAP overnight; now d/c'd and will continue supplemental O2 -S/p thoracentesis on 2/9 with removal 600cc of fluid -Overall prognosis is poor given severe pulmonary HTN  #HTN: Elevated. -Resume hydralazine 25mg  TID -Continue coreg 3.125mg  BID  #AKI on CKD 3A: Slight bump overnight. Now transitioning to PO torsemide.  -Lasix stopped -Start torsemide 40mg  daily  #History of cryptogenic stroke: -Continue plavix 75mg  daily -Continue lipitor 80mg  daily  #Nonobstructive CAD: -Continue lipitor 80mg  daily  #Chronic Anemia: -Stable; trend     For questions or updates, please contact [facility] Please consult [email or web address] for contact info under        Signed, Freada Bergeron, MD  02/13/2021, 6:45 AM

## 2021-02-13 NOTE — Progress Notes (Signed)
Chest pain, trop leak, unchanged ekg.  Echo, heparin, trend trops. Keep in sdu for now discussed with cardiology.   Erskine Emery md PCCM

## 2021-02-13 NOTE — Plan of Care (Signed)
  Problem: Nutrition: Goal: Adequate nutrition will be maintained Outcome: Progressing   

## 2021-02-14 ENCOUNTER — Encounter (HOSPITAL_COMMUNITY): Payer: Self-pay | Admitting: Family Medicine

## 2021-02-14 ENCOUNTER — Inpatient Hospital Stay (HOSPITAL_COMMUNITY): Payer: Medicare HMO

## 2021-02-14 DIAGNOSIS — I214 Non-ST elevation (NSTEMI) myocardial infarction: Secondary | ICD-10-CM

## 2021-02-14 DIAGNOSIS — J9621 Acute and chronic respiratory failure with hypoxia: Secondary | ICD-10-CM | POA: Diagnosis not present

## 2021-02-14 DIAGNOSIS — I509 Heart failure, unspecified: Secondary | ICD-10-CM | POA: Diagnosis not present

## 2021-02-14 DIAGNOSIS — I272 Pulmonary hypertension, unspecified: Secondary | ICD-10-CM | POA: Diagnosis not present

## 2021-02-14 LAB — CBC
HCT: 27.3 % — ABNORMAL LOW (ref 36.0–46.0)
Hemoglobin: 8.4 g/dL — ABNORMAL LOW (ref 12.0–15.0)
MCH: 25.1 pg — ABNORMAL LOW (ref 26.0–34.0)
MCHC: 30.8 g/dL (ref 30.0–36.0)
MCV: 81.5 fL (ref 80.0–100.0)
Platelets: 323 10*3/uL (ref 150–400)
RBC: 3.35 MIL/uL — ABNORMAL LOW (ref 3.87–5.11)
RDW: 15.7 % — ABNORMAL HIGH (ref 11.5–15.5)
WBC: 6.1 10*3/uL (ref 4.0–10.5)
nRBC: 0 % (ref 0.0–0.2)

## 2021-02-14 LAB — BASIC METABOLIC PANEL
Anion gap: 8 (ref 5–15)
BUN: 35 mg/dL — ABNORMAL HIGH (ref 8–23)
CO2: 43 mmol/L — ABNORMAL HIGH (ref 22–32)
Calcium: 8.8 mg/dL — ABNORMAL LOW (ref 8.9–10.3)
Chloride: 85 mmol/L — ABNORMAL LOW (ref 98–111)
Creatinine, Ser: 1.29 mg/dL — ABNORMAL HIGH (ref 0.44–1.00)
GFR, Estimated: 42 mL/min — ABNORMAL LOW (ref >60)
Glucose, Bld: 109 mg/dL — ABNORMAL HIGH (ref 70–99)
Potassium: 3.7 mmol/L (ref 3.5–5.1)
Sodium: 136 mmol/L (ref 135–145)

## 2021-02-14 LAB — ECHOCARDIOGRAM LIMITED
Calc EF: 55.2 %
Height: 59 in
S' Lateral: 2.7 cm
Single Plane A2C EF: 52.1 %
Single Plane A4C EF: 59.2 %
Weight: 2476.21 oz

## 2021-02-14 LAB — HEPARIN LEVEL (UNFRACTIONATED)
Heparin Unfractionated: >1.1 IU/mL — ABNORMAL HIGH (ref 0.30–0.70)
Heparin Unfractionated: 0.48 IU/mL (ref 0.30–0.70)

## 2021-02-14 LAB — COOXEMETRY PANEL
Carboxyhemoglobin: 1.9 % — ABNORMAL HIGH (ref 0.5–1.5)
Methemoglobin: 1.3 % (ref 0.0–1.5)
O2 Saturation: 92.9 %
Total hemoglobin: 8.6 g/dL — ABNORMAL LOW (ref 12.0–16.0)

## 2021-02-14 LAB — GLUCOSE, CAPILLARY: Glucose-Capillary: 151 mg/dL — ABNORMAL HIGH (ref 70–99)

## 2021-02-14 LAB — CHOLESTEROL, BODY FLUID: Cholesterol, Fluid: 56 mg/dL

## 2021-02-14 MED ORDER — ISOSORBIDE MONONITRATE ER 30 MG PO TB24: 15.0000 mg | ORAL_TABLET | Freq: Every day | ORAL | Status: DC

## 2021-02-14 MED ORDER — CARVEDILOL 6.25 MG PO TABS
6.2500 mg | ORAL_TABLET | Freq: Two times a day (BID) | ORAL | Status: DC
  Administered 2021-02-14 – 2021-02-16 (×5): 6.25 mg via ORAL
  Filled 2021-02-14 (×5): qty 1

## 2021-02-14 MED ORDER — HEPARIN (PORCINE) 25000 UT/250ML-% IV SOLN
900.0000 [IU]/h | INTRAVENOUS | Status: DC
  Administered 2021-02-14: 750 [IU]/h via INTRAVENOUS
  Filled 2021-02-14 (×2): qty 250

## 2021-02-14 MED ORDER — MORPHINE SULFATE (PF) 2 MG/ML IV SOLN
2.0000 mg | INTRAVENOUS | Status: DC | PRN
  Administered 2021-02-15 – 2021-02-16 (×2): 2 mg via INTRAVENOUS
  Filled 2021-02-14 (×2): qty 1

## 2021-02-14 NOTE — Progress Notes (Signed)
Daily Progress Note   Patient Name: Mandy [name]       Date: 02/14/2021 DOB: [birth year]  Age: 81 y.o. MRN#: [id number] Attending Physician: Jennye Boroughs, MD Primary Care Physician: [name], MD Admit Date: 02/02/2021  Reason for Consultation/Follow-up: Establishing goals of care  Subjective: I saw and examined Ms. Perdomo and spoke with her and her daughter at the bedside.  Her daughter remembers me from last visit.   We discussed clinical course as their understanding of her situation.  Her daughter then stated, " you want to talk about code again, right?"  I asked her if this is something they have been discussing and she reports that she and her mother talked earlier today with hospitalist about this.  She said her mother has been very clear that she wants to continue with aggressive interventions including attempts at CPR mechanical ventilation in the event of cardiac or respiratory arrest.  She also states this is something that are not going to discuss any further today.  I spent a few minutes continuing to attempt to build more rapport and understanding of their views on her situation and desires for her care moving forward.  Overall, they continue to minimize the severity of her condition.  They are agreeable to me following up again tomorrow.  Length of Stay: 11  Current Medications: Scheduled Meds:   arformoterol  15 mcg Nebulization BID   atorvastatin  80 mg Oral QPM   carvedilol  6.25 mg Oral BID WC   Chlorhexidine Gluconate Cloth  6 each Topical Daily   clopidogrel  75 mg Oral Daily   fluticasone  1 spray Each Nare Daily   gabapentin  100 mg Oral QHS   hydrALAZINE  25 mg Oral Q8H   mouth rinse  15 mL Mouth Rinse BID   melatonin  10 mg Oral QHS   methimazole  2.5  mg Oral Daily   pantoprazole  40 mg Oral q morning   revefenacin  175 mcg Nebulization Daily   sildenafil  20 mg Oral TID   sodium chloride flush  3 mL Intravenous Q12H   torsemide  40 mg Oral Daily    Continuous Infusions:  heparin 750 Units/hr (02/14/21 1400)    PRN Meds: acetaminophen **OR** acetaminophen, albuterol, alum & mag hydroxide-simeth, docusate sodium, hydrALAZINE, morphine injection,  ondansetron (ZOFRAN) IV, sodium chloride, traMADol  Physical Exam         General: Sitting in bed, sleepy  HEENT: No bruits, no goiter, no JVD Heart: Regular rate and rhythm. No murmur appreciated. Lungs: Good air movement, scattered coarse  Abdomen: Soft, nontender, nondistended, positive bowel sounds.   Ext: No significant edema Skin: Warm and dry  Vital Signs: BP (!) 158/142    Pulse 78    Temp 98.1 F (36.7 C) (Axillary)    Resp (!) 21    Ht 4\' 11"  (1.499 m)    Wt 70.2 kg    SpO2 100%    BMI 31.26 kg/m  SpO2: SpO2: 100 % O2 Device: O2 Device: Nasal Cannula O2 Flow Rate: O2 Flow Rate (L/min): 3 L/min  Intake/output summary:  Intake/Output Summary (Last 24 hours) at 02/14/2021 1613 Last data filed at 02/14/2021 1400 Gross per 24 hour  Intake 307.31 ml  Output 850 ml  Net -542.69 ml    LBM: Last BM Date: 02/13/21 Baseline Weight: Weight: 74.8 kg Most recent weight: Weight: 70.2 kg    Patient Active Problem List   Diagnosis Date Noted   Acute on chronic diastolic CHF (congestive heart failure) ([place]) 02/03/2021   Pressure injury of skin 12/16/2020   CHF exacerbation ([place]) 12/14/2020   Cerebral thrombosis with cerebral infarction 12/07/2020   CVA (cerebral vascular accident) ([place]) 12/06/2020   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[place]) 12/03/2020   Chest pain of uncertain etiology    Severe pulmonary hypertension (HCC)    Chronic respiratory failure with hypoxia, on home oxygen therapy ([place]) 11/24/2020   Anxiety 11/24/2020   Acute on chronic respiratory failure with hypoxia  and hypercapnia (HCC) 11/23/2020   Acute on chronic heart failure with preserved ejection fraction (HFpEF) ([place]) 11/23/2020   Hypoxia 11/03/2020   Pleural effusion on right 10/01/2020   Hyperkalemia 10/01/2020   Acute renal failure superimposed on stage 3a chronic kidney disease ([place]) 10/01/2020   Anemia 10/01/2020   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   Acute respiratory failure with hypoxia ([place]) 06/26/2020   Hypertensive urgency 06/26/2020   Generalized weakness 06/26/2020   Dizziness 06/26/2020   Obesity (BMI 30-39.9) 06/26/2020   Hyperthyroidism 06/26/2020   Chronic diastolic CHF (congestive heart failure) ([place]) 06/26/2020   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  PCCM was consulted for hypercarbia and she was started on milrinone.  She had thoracentesis followed a couple of days later by chest pain with elevated troponins.  Palliative consulted for goals of care previously and goal was for full code/full scope at that time.  Palliative asked to reevaluate again due to NSTEMI.  Recommendations/Plan: Full code/full scope She and her daughter full scope treatment including any medical interventions that are offered.  Her daughter reports that they actually  had discussion about this again today and patient has clearly  stated that she wants to continue with full scope care including attempts at CPR and mechanical ventilation if needed.  She relayed that they are "finished talking about that for today."  Goals of Care and Additional Recommendations: Limitations on Scope of Treatment: Full Scope Treatment  Prognosis: Guarded  Discharge Planning: To Be Determined  Care plan was discussed with patient, daughter  Thank you for allowing the Palliative Medicine Team to assist in the care of this patient.   Total Time 35 Prolonged Time Billed No   Micheline Rough, MD  Please contact Palliative Medicine Team phone at [phone] for questions and concerns.

## 2021-02-14 NOTE — Progress Notes (Signed)
Around 9 am patient had a brief episode of heart rate becoming elevated into the 120's. Patient was complaining of chest pain and had o2 levels drop into the 60's. Patients o2 was bumped up to 15L and she was able to recover quickly. Cardiology was notified.

## 2021-02-14 NOTE — Progress Notes (Signed)
°  Echocardiogram 2D Echocardiogram has been performed.  Mandy [name] 02/14/2021, 1:40 PM

## 2021-02-14 NOTE — Progress Notes (Signed)
02/14/2021 Patient refusing heparin level due at 0100 hours. Staff member who speaks some [place] dialect and has rapport with patient discussed with her but patient was adamant that she would not have blood drawn at this time and was very suspicious. Offered to call patient's daughter to have her discuss with patient but patient refused to have her daughter contacted. Notified pharmacist of patient's refusal and also contacted e-Link RN Harvel Quale regarding above. Cindy S. Brigitte Pulse BSN, RN, [place] 02/14/2021 12:59 AM

## 2021-02-14 NOTE — Progress Notes (Signed)
Patient with recurrent chest pain and episode of hypoxia this morning. Was temporarily placed on 15L but symptoms resolved shortly thereafter and her O2 was able to be weaned. Discussed with primary team and nursing. Unfortunately, her prognosis is very poor with frequent rehospitalizations. She is not a good cath candidate due to very poor prognosis in the setting of severe pulmonary HTN.  Recommend revisiting goals of care.  Gwyndolyn Kaufman, MD

## 2021-02-14 NOTE — Progress Notes (Signed)
[place] for heparin Indication: chest pain/ACS  No Known Allergies  Patient Measurements: Height: 4\' 11"  (149.9 cm) Weight: 70.2 kg (154 lb 12.2 oz) IBW/kg (Calculated) : 43.2 Heparin Dosing Weight: 69 kg  Vital Signs: Temp: 98.2 F (36.8 C) (02/12 0449) Temp Source: Axillary (02/12 0449) BP: 153/60 (02/12 0700) Pulse Rate: 73 (02/11 2200)  Labs: Recent Labs    02/11/21 1111 02/12/21 0315 02/13/21 0257 02/13/21 1522 02/13/21 1721 02/14/21 0643  HGB 8.1*  --   --   --   --  8.4*  HCT 26.5*  --   --   --   --  27.3*  PLT 270  --   --   --   --  323  HEPARINUNFRC  --   --   --   --   --  >1.10*  CREATININE 2.00* 1.64* 1.79*  --   --  1.29*  TROPONINIHS  --   --   --  1,232* 1,166*  --      Estimated Creatinine Clearance: 30.1 mL/min (A) (by C-G formula based on SCr of 1.29 mg/dL (H)).   Assessment: Patient's a 81 y.o F with hx HF, pulmonary HTN, chronic hypoxic and hypercarbic respiratory failure, CKD presented to the ED on 02/02/21 fwith c/o SOB. She developed CP on 02/13/21 with elevated troponin. Pharmacy has been consulted to dose heparin for ACS.  Today, 02/14/2021: - Troponin 1232 >> 1166 - Patient intermittently refusing lab draws - heparin level collected this AM ~0645 was supratherapeutic at >1.1 - D/w RN - heparin is running in L upper arm, unable to tell where lab was drawn this AM. No issues with IV, no bleeding.   Goal of Therapy:  Heparin level 0.3-0.7 units/ml Monitor platelets by anticoagulation protocol: Yes   Plan:  - Hold heparin drip x 1 hour - Resume heparin drip at 750 units/hr @ 0900 this AM - Check 8 hr heparin level - Monitor for s/sx bleeding - F/u cardiology recommendations  Dimple Nanas, PharmD 02/14/2021 7:45 AM

## 2021-02-14 NOTE — Progress Notes (Addendum)
Progress Note    Mandy [name]  EXH:[id number] DOB: [birth year]  DOA: 02/02/2021 PCP: [name], MD      Brief Narrative:    Medical records reviewed and are as summarized below:  Mandy [name] is a 81 y.o. female with PMH significant for severe pulmonary hypertension, hyperlipidemia, hypertension, diabetes mellitus, recent pleural effusions, goiter, hyperthyroidism, chronic diastolic CHF, who presented to the ED 02/03/2021 after desaturating during  a sleep study to 30% for about 10 minutes.  She had increased pleural effusions on CXR and was requiring 4L O2.  She was admitted to telemetry and treated with IV Lasix.   On 2/2 she was increasingly dyspneic and ABG showed pH 7.3 with pCO2 of 77 and PCCM was consulted.       Significant Hospital Events: Including procedures, antibiotic start and stop dates in addition to other pertinent events   2/1 admit to hospitalists 2/2 PCCM consult for hypercarbia 2/3 Patient had loss of consciousness with O2 desaturation with movement 2/5 milrinone dose titrated down, tolerated well 2/6 milrinone off, started sildenafil 2/9 thoracentesis 600cc, probably pseudoexudate in setting diuresis (exudative by protein only), cyto pending 2/11 she complained of chest pain and troponins were elevated to 1,232 and 1,166.  She was started on IV heparin drip     Assessment/Plan:   Principal Problem:   Acute on chronic respiratory failure with hypoxia and hypercapnia (HCC) Active Problems:   Diabetes mellitus type 2, controlled (Mandy [name])   Hyperthyroidism   Hyperkalemia   Acute renal failure superimposed on stage 3a chronic kidney disease (HCC)   Severe pulmonary hypertension (HCC)   Acute on chronic diastolic CHF (congestive heart failure) (HCC)    Body mass index is 31.26 kg/m.  (Obesity)   #Chest Pain on 2/11: #NSTEMI: Troponin went up to 1,232 and 1,166.  Recent cath on 11/22/2020 showed 30% LAD disease and nonobstructive  disease. Continue IV heparin drip for up to 48 hours.  2D echo is pending. Continue Plavix, Coreg and Lipitor Not a good candidate for cardiac cath because of severe pulmonary hypertension per cardiologist.   #Acute on Chronic Hypoxic/Hypercapneic Respiratory Failure #Acute on Chronic Diastolic HF: #Severe Pulmonary HTN WHO Group II/III: #Bilateral Pleural Effusions: TTE on this admission with EF 60-65%, G1DD, severe pulmonary HTN with PASP 159mmHg. Now off milrinone and transitioned to sildenafil. V/Q scan negative for CTEPH. S/p right-sided thoracentesis on 02/11/2021 with removal of 600 cc of fluid. Continue sildenafil and torsemide She has been refusing BiPAP. Continue worsening hypoxia.  She is gone from 3 L to 15 L/min oxygen via HFNC.  Taper down oxygen as able   #HTN: Continue antihypertensives   #AKI on CKD 3A: Improved.  Continue torsemide   #History of cryptogenic stroke, nonobstructive CAD: -Continue plavix 75mg  daily -Continue lipitor 80mg  daily   #Chronic Anemia: -Stable; trend  #Hyperkalemia and hypokalemia: Resolved   Discussed diagnoses, prognosis and goals of care with patient's daughter, Ms. Mandy [name].  She remains a full code for now. Prognosis is poor.  Follow-up with palliative care team.   Diet Order             Diet Heart Room service appropriate? Yes; Fluid consistency: Thin; Fluid restriction: 2000 mL Fluid  Diet effective now                     Consultants: Cardiologist Intensivist  Procedures: Right thoracentesis 02/11/2021    Medications:    arformoterol  15 mcg Nebulization BID  atorvastatin  80 mg Oral QPM   carvedilol  6.25 mg Oral BID WC   Chlorhexidine Gluconate Cloth  6 each Topical Daily   clopidogrel  75 mg Oral Daily   fluticasone  1 spray Each Nare Daily   gabapentin  100 mg Oral QHS   hydrALAZINE  25 mg Oral Q8H   mouth rinse  15 mL Mouth Rinse BID   melatonin  10 mg Oral QHS   methimazole  2.5 mg Oral  Daily   pantoprazole  40 mg Oral q morning   revefenacin  175 mcg Nebulization Daily   sildenafil  20 mg Oral TID   sodium chloride flush  3 mL Intravenous Q12H   torsemide  40 mg Oral Daily   Continuous Infusions:  heparin 750 Units/hr (02/14/21 0908)     Anti-infectives (From admission, onward)    None              Family Communication/Anticipated D/C date and plan/Code Status   DVT prophylaxis:      Code Status: Full Code  Family Communication: Plan discussed with her daughter, Ms. Mandy [name], over the phone Disposition Plan: Plan to discharge to home versus SNF when medically stable   Status is: Inpatient Remains inpatient appropriate because: Acute NSTEMI, acute hypoxic respiratory failure               Subjective:   Interval events noted.  She complains of mild intermittent chest pain.  Oxygen saturations dropped into the 60s and oxygen requirement was bumped up to 15 L/min via HFNC  Objective:    Vitals:   02/14/21 0700 02/14/21 0800 02/14/21 0828 02/14/21 0843  BP: (!) 153/60   (!) 119/41  Pulse:    88  Resp: 17     Temp:  98.1 F (36.7 C)    TempSrc:  Axillary    SpO2:   100%   Weight:      Height:       No data found.   Intake/Output Summary (Last 24 hours) at 02/14/2021 1048 Last data filed at 02/14/2021 0910 Gross per 24 hour  Intake 270.86 ml  Output 1100 ml  Net -829.14 ml   Filed Weights   02/12/21 0500 02/13/21 0500 02/14/21 0500  Weight: 73.3 kg 69.3 kg 70.2 kg    Exam:  GEN: NAD SKIN: Warm and dry EYES: No pallor or icterus ENT: MMM CV: RRR PULM: Decreased air entry bilaterally.  No wheezing or rales heard ABD: soft, ND, NT, +BS CNS: AAO x 3, non focal EXT: No edema or tenderness      Data Reviewed:   I have personally reviewed following labs and imaging studies:  Labs: Labs show the following:   Basic Metabolic Panel: Recent Labs  Lab 02/08/21 0855 02/09/21 0302 02/09/21 0748  02/10/21 0636 02/11/21 1111 02/12/21 0315 02/13/21 0257 02/14/21 0643  NA  --  134*   < > 133* 135 136 138 136  K  --  4.4   < > 3.8 2.9* 3.9 3.9 3.7  CL  --  81*   < > 81* 80* 83* 84* 85*  CO2  --  44*   < > >45* >45* >45* >45* 43*  GLUCOSE  --  122*   < > 122* 189* 120* 111* 109*  BUN  --  63*   < > 69* 66* 53* 49* 35*  CREATININE  --  3.70*   < > 2.90* 2.00* 1.64* 1.79* 1.29*  CALCIUM  --  8.5*   < > 8.5* 8.5* 8.4* 8.5* 8.8*  MG 2.6* 2.7*  --  2.8* 2.6*  --   --   --    < > = values in this interval not displayed.   GFR Estimated Creatinine Clearance: 30.1 mL/min (A) (by C-G formula based on SCr of 1.29 mg/dL (H)). Liver Function Tests: Recent Labs  Lab 02/09/21 0748 02/11/21 1627  AST 32  --   ALT 19  --   ALKPHOS 47  --   BILITOT 0.3  --   PROT 6.3*  --   ALBUMIN 3.2* 3.2*   No results for input(s): LIPASE, AMYLASE in the last 168 hours. No results for input(s): AMMONIA in the last 168 hours. Coagulation profile No results for input(s): INR, PROTIME in the last 168 hours.  CBC: Recent Labs  Lab 02/08/21 0855 02/09/21 0302 02/10/21 0636 02/11/21 1111 02/14/21 0643  WBC 7.4 6.8 5.5 5.8 6.1  HGB 8.6* 7.6* 7.7* 8.1* 8.4*  HCT 28.4* 24.5* 25.1* 26.5* 27.3*  MCV 84.3 81.9 83.4 82.8 81.5  PLT 243 218 240 270 323   Cardiac Enzymes: No results for input(s): CKTOTAL, CKMB, CKMBINDEX, TROPONINI in the last 168 hours. BNP (last 3 results) Recent Labs    01/14/21 1653  PROBNP 443.0*   CBG: No results for input(s): GLUCAP in the last 168 hours. D-Dimer: No results for input(s): DDIMER in the last 72 hours. Hgb A1c: No results for input(s): HGBA1C in the last 72 hours. Lipid Profile: No results for input(s): CHOL, HDL, LDLCALC, TRIG, CHOLHDL, LDLDIRECT in the last 72 hours. Thyroid function studies: No results for input(s): TSH, T4TOTAL, T3FREE, THYROIDAB in the last 72 hours.  Invalid input(s): FREET3 Anemia work up: No results for input(s): VITAMINB12,  FOLATE, FERRITIN, TIBC, IRON, RETICCTPCT in the last 72 hours. Sepsis Labs: Recent Labs  Lab 02/09/21 0302 02/10/21 0636 02/11/21 1111 02/14/21 0643  WBC 6.8 5.5 5.8 6.1    Microbiology Recent Results (from the past 240 hour(s))  MRSA Next Gen by PCR, Nasal     Status: None   Collection Time: 02/08/21 11:59 PM   Specimen: Nasal Mucosa; Nasal Swab  Result Value Ref Range Status   MRSA by PCR Next Gen NOT DETECTED NOT DETECTED Final    Comment: (NOTE) The GeneXpert MRSA Assay (FDA approved for NASAL specimens only), is one component of a comprehensive MRSA colonization surveillance program. It is not intended to diagnose MRSA infection nor to guide or monitor treatment for MRSA infections. Test performance is not FDA approved in patients less than 65 years old. Performed at [facility], [place] [address], [place], [place] [address]   Body fluid culture w Gram Stain     Status: None (Preliminary result)   Collection Time: 02/11/21  4:24 PM   Specimen: Pleural Fluid  Result Value Ref Range Status   Specimen Description   Final    PLEURAL Performed at [facility] [address], [place], [place] [address]    Special Requests   Final    NONE Performed at [facility], [place] [address], [place], [place] [address]    Gram Stain   Final    NO SQUAMOUS EPITHELIAL CELLS SEEN FEW WBC SEEN NO ORGANISMS SEEN    Culture   Final    NO GROWTH 2 DAYS Performed at [facility], [place] [address] [place], [place] [address]    Report Status PENDING  Incomplete  Fungus  Culture With Stain     Status: None (Preliminary result)   Collection Time: 02/11/21  4:25 PM   Specimen: Pleural Fluid  Result Value Ref Range Status   Fungus Stain Final report  Final    Comment: (NOTE) Performed At: [facility] [place], [place] [id number] Rush Farmer MD NW:[id number]    Fungus (Mycology) Culture PENDING   Incomplete   Fungal Source PLEURAL  Final    Comment: Performed at [facility], [place] [address] [place], [place] [address]  Fungus Culture Result     Status: None   Collection Time: 02/11/21  4:25 PM  Result Value Ref Range Status   Result 1 Comment  Final    Comment: (NOTE) KOH/Calcofluor preparation:  no fungus observed. Performed At: [facility] [place], [place] [id number] Rush Farmer MD LK:[id number]     Procedures and diagnostic studies:  NM Pulmonary Perfusion  Result Date: 02/12/2021 CLINICAL DATA:  Short of breath, concern for pulmonary embolism. EXAM: NUCLEAR MEDICINE PERFUSION LUNG SCAN TECHNIQUE: Perfusion images were obtained in multiple projections after intravenous injection of radiopharmaceutical. RADIOPHARMACEUTICALS:  4.4 mCi Tc-41m MAA COMPARISON:  Chest radiograph 02/11/2021 CT chest 12/14/2020 FINDINGS: Decreased regional perfusion to the RIGHT lower lobe corresponds to loculated effusion on comparison radiograph and CT. No wedge-shaped peripheral perfusion defects to localize acute pulmonary embolism. IMPRESSION: 1. No evidence acute pulmonary embolism. 2. Decreased perfusion RIGHT lower lobe related to large loculated pleural effusion. Electronically Signed   By: Suzy Bouchard M.D.   On: 02/12/2021 14:20   DG CHEST PORT 1 VIEW  Result Date: 02/13/2021 CLINICAL DATA:  Increased shortness of breath. EXAM: PORTABLE CHEST 1 VIEW COMPARISON:  02/11/2021 FINDINGS: Unchanged cardiomediastinal contours. No significant change in the appearance of bilateral pleural effusions with mild diffuse interstitial edema. Areas of atelectasis are noted bilaterally, left greater than right. IMPRESSION: No change in aeration to the lungs compared with previous exam. Electronically Signed   By: Kerby Moors M.D.   On: 02/13/2021 16:27               LOS: 11 days   Malai Lady  [facility]   Pager on [email or web address]  7PM-7AM, please contact night-coverage at [email or web address]     02/14/2021, 10:48 AM

## 2021-02-14 NOTE — Plan of Care (Signed)
°  Problem: Education: Goal: Ability to demonstrate management of disease process will improve Outcome: Progressing   Problem: Activity: Goal: Capacity to carry out activities will improve Outcome: Progressing   Problem: Education: Goal: Knowledge of General Education information will improve Description: Including pain rating scale, medication(s)/side effects and non-pharmacologic comfort measures Outcome: Progressing   Problem: Health Behavior/Discharge Planning: Goal: Ability to manage health-related needs will improve Outcome: Progressing   Problem: Clinical Measurements: Goal: Ability to maintain clinical measurements within normal limits will improve Outcome: Progressing Goal: Will remain free from infection Outcome: Progressing Goal: Diagnostic test results will improve Outcome: Progressing Goal: Respiratory complications will improve Outcome: Progressing   Problem: Activity: Goal: Risk for activity intolerance will decrease Outcome: Progressing   Problem: Nutrition: Goal: Adequate nutrition will be maintained Outcome: Progressing   Problem: Coping: Goal: Level of anxiety will decrease Outcome: Progressing   Problem: Safety: Goal: Ability to remain free from injury will improve Outcome: Progressing   Problem: Skin Integrity: Goal: Risk for impaired skin integrity will decrease Outcome: Progressing   Cindy S. Brigitte Pulse BSN, RN, [place] 02/14/2021 5:17 AM

## 2021-02-14 NOTE — Progress Notes (Signed)
[facility] Progress Note Patient Name: Mandy [name] DOB: 1940-06-07 MRN: [id number]   Date of Service  02/14/2021  HPI/Events of Note  Patient refusing heparin draws.   eICU Interventions  Discussed plan with pharmacy and RN. Continue heparin and monitor for s/sx of bleeding  Will obtain heparin level when able  Day team to assess in the am     Intervention Category Intermediate Interventions: Coagulopathy - evaluation and management  Devina Bezold Rodman Pickle 02/14/2021, 1:11 AM

## 2021-02-14 NOTE — Plan of Care (Signed)
  Problem: Nutrition: Goal: Adequate nutrition will be maintained Outcome: Progressing   Problem: Coping: Goal: Level of anxiety will decrease Outcome: Progressing   Problem: Elimination: Goal: Will not experience complications related to bowel motility Outcome: Progressing Goal: Will not experience complications related to urinary retention Outcome: Progressing   Problem: Pain Managment: Goal: General experience of comfort will improve Outcome: Progressing   

## 2021-02-14 NOTE — Progress Notes (Signed)
[place] for heparin Indication: chest pain/ACS  No Known Allergies  Patient Measurements: Height: 4\' 11"  (149.9 cm) Weight: 70.2 kg (154 lb 12.2 oz) IBW/kg (Calculated) : 43.2 Heparin Dosing Weight: 69 kg  Vital Signs: Temp: 98.1 F (36.7 C) (02/12 1700) Temp Source: Axillary (02/12 0800) BP: 175/87 (02/12 1704) Pulse Rate: 82 (02/12 1704)  Labs: Recent Labs    02/12/21 0315 02/13/21 0257 02/13/21 1522 02/13/21 1721 02/14/21 0643 02/14/21 1651  HGB  --   --   --   --  8.4*  --   HCT  --   --   --   --  27.3*  --   PLT  --   --   --   --  323  --   HEPARINUNFRC  --   --   --   --  >1.10* 0.48  CREATININE 1.64* 1.79*  --   --  1.29*  --   TROPONINIHS  --   --  1,232* 1,166*  --   --      Estimated Creatinine Clearance: 30.1 mL/min (A) (by C-G formula based on SCr of 1.29 mg/dL (H)).   Assessment: Patient's a 81 y.o F with hx HF, pulmonary HTN, chronic hypoxic and hypercarbic respiratory failure, CKD presented to the ED on 02/02/21 fwith c/o SOB. She developed CP on 02/13/21 with elevated troponin. Pharmacy has been consulted to dose heparin for ACS.  Today, 02/14/2021: - Troponin 1232 >> 1166 - Patient intermittently refusing lab draws - heparin level collected this AM ~0645 was supratherapeutic at >1.1 - D/w RN - heparin is running in L upper arm, unable to tell where lab was drawn this AM. No issues with IV, no bleeding.  PM Update 02/14/21 5:27 PM  - HL 0.48 at goal - No bleeding or issues with heparin infusion per RN  Goal of Therapy:  Heparin level 0.3-0.7 units/ml Monitor platelets by anticoagulation protocol: Yes   Plan:  - Continue heparin infusion at 750 units/hr - Check 8 hr  confirmatory heparin level - Monitor for s/sx bleeding  Ulice Dash, PharmD 02/14/2021 5:27 PM

## 2021-02-15 DIAGNOSIS — J9621 Acute and chronic respiratory failure with hypoxia: Secondary | ICD-10-CM | POA: Diagnosis not present

## 2021-02-15 DIAGNOSIS — J90 Pleural effusion, not elsewhere classified: Secondary | ICD-10-CM

## 2021-02-15 DIAGNOSIS — J9622 Acute and chronic respiratory failure with hypercapnia: Secondary | ICD-10-CM | POA: Diagnosis not present

## 2021-02-15 LAB — BODY FLUID CULTURE W GRAM STAIN
Culture: NO GROWTH
Gram Stain: NONE SEEN

## 2021-02-15 LAB — CBC
HCT: 26.9 % — ABNORMAL LOW (ref 36.0–46.0)
Hemoglobin: 8.5 g/dL — ABNORMAL LOW (ref 12.0–15.0)
MCH: 25.7 pg — ABNORMAL LOW (ref 26.0–34.0)
MCHC: 31.6 g/dL (ref 30.0–36.0)
MCV: 81.3 fL (ref 80.0–100.0)
Platelets: 358 10*3/uL (ref 150–400)
RBC: 3.31 MIL/uL — ABNORMAL LOW (ref 3.87–5.11)
RDW: 15.7 % — ABNORMAL HIGH (ref 11.5–15.5)
WBC: 6.5 10*3/uL (ref 4.0–10.5)
nRBC: 0 % (ref 0.0–0.2)

## 2021-02-15 LAB — BASIC METABOLIC PANEL
Anion gap: 8 (ref 5–15)
BUN: 38 mg/dL — ABNORMAL HIGH (ref 8–23)
CO2: 42 mmol/L — ABNORMAL HIGH (ref 22–32)
Calcium: 9 mg/dL (ref 8.9–10.3)
Chloride: 86 mmol/L — ABNORMAL LOW (ref 98–111)
Creatinine, Ser: 1.29 mg/dL — ABNORMAL HIGH (ref 0.44–1.00)
GFR, Estimated: 42 mL/min — ABNORMAL LOW (ref >60)
Glucose, Bld: 124 mg/dL — ABNORMAL HIGH (ref 70–99)
Potassium: 3.9 mmol/L (ref 3.5–5.1)
Sodium: 136 mmol/L (ref 135–145)

## 2021-02-15 LAB — HEPARIN LEVEL (UNFRACTIONATED)
Heparin Unfractionated: 0.22 IU/mL — ABNORMAL LOW (ref 0.30–0.70)
Heparin Unfractionated: 0.25 IU/mL — ABNORMAL LOW (ref 0.30–0.70)
Heparin Unfractionated: 0.48 IU/mL (ref 0.30–0.70)

## 2021-02-15 LAB — GLUCOSE, CAPILLARY: Glucose-Capillary: 121 mg/dL — ABNORMAL HIGH (ref 70–99)

## 2021-02-15 MED ORDER — DIPHENHYDRAMINE HCL 25 MG PO CAPS
25.0000 mg | ORAL_CAPSULE | Freq: Once | ORAL | Status: AC
  Administered 2021-02-15: 25 mg via ORAL
  Filled 2021-02-15: qty 1

## 2021-02-15 NOTE — Progress Notes (Signed)
Progress Note  Patient Name: Mandy [name] Date of Encounter: 02/15/2021  Primary Cardiologist:   Donato Heinz, MD   Subjective   She had some chest pain but no pain this AM.  Reports that she is breathing OK.    Inpatient Medications    Scheduled Meds:  arformoterol  15 mcg Nebulization BID   atorvastatin  80 mg Oral QPM   carvedilol  6.25 mg Oral BID WC   Chlorhexidine Gluconate Cloth  6 each Topical Daily   clopidogrel  75 mg Oral Daily   fluticasone  1 spray Each Nare Daily   gabapentin  100 mg Oral QHS   hydrALAZINE  25 mg Oral Q8H   mouth rinse  15 mL Mouth Rinse BID   melatonin  10 mg Oral QHS   methimazole  2.5 mg Oral Daily   pantoprazole  40 mg Oral q morning   revefenacin  175 mcg Nebulization Daily   sildenafil  20 mg Oral TID   sodium chloride flush  3 mL Intravenous Q12H   torsemide  40 mg Oral Daily   Continuous Infusions:  heparin 800 Units/hr (02/15/21 0400)   PRN Meds: acetaminophen **OR** acetaminophen, albuterol, alum & mag hydroxide-simeth, docusate sodium, hydrALAZINE, morphine injection, ondansetron (ZOFRAN) IV, sodium chloride, traMADol   Vital Signs    Vitals:   02/15/21 0600 02/15/21 0630 02/15/21 0700 02/15/21 0857  BP: (!) 143/43   (!) 128/55  Pulse:    (!) 106  Resp: (!) 23     Temp:      TempSrc:      SpO2:  99% 98%   Weight:      Height:        Intake/Output Summary (Last 24 hours) at 02/15/2021 0942 Last data filed at 02/15/2021 0600 Gross per 24 hour  Intake 142.1 ml  Output 500 ml  Net -357.9 ml   Filed Weights   02/13/21 0500 02/14/21 0500 02/15/21 0500  Weight: 69.3 kg 70.2 kg 70.8 kg    Telemetry    NSR - Personally Reviewed  ECG    NA - Personally Reviewed  Physical Exam   GEN: No acute distress.   Neck: No  JVD Cardiac: RRR, NO murmurs, rubs, or gallops.  Respiratory:    Clear to auscultation bilaterally. GI: Soft, nontender, non-distended  MS: No  edema; No deformity. Neuro:  Nonfocal   Psych: Normal affect   Labs    Chemistry Recent Labs  Lab 02/09/21 0748 02/10/21 0636 02/11/21 1627 02/12/21 0315 02/13/21 0257 02/14/21 0643 02/15/21 0013  NA 134*   < >  --    < > 138 136 136  K 4.0   < >  --    < > 3.9 3.7 3.9  CL 80*   < >  --    < > 84* 85* 86*  CO2 >45*   < >  --    < > >45* 43* 42*  GLUCOSE 138*   < >  --    < > 111* 109* 124*  BUN 67*   < >  --    < > 49* 35* 38*  CREATININE 3.39*   < >  --    < > 1.79* 1.29* 1.29*  CALCIUM 8.7*   < >  --    < > 8.5* 8.8* 9.0  PROT 6.3*  --   --   --   --   --   --   ALBUMIN 3.2*  --  3.2*  --   --   --   --   AST 32  --   --   --   --   --   --   ALT 19  --   --   --   --   --   --   ALKPHOS 47  --   --   --   --   --   --   BILITOT 0.3  --   --   --   --   --   --   GFRNONAA 13*   < >  --    < > 28* 42* 42*  ANIONGAP NOT CALCULATED   < >  --   --  NOT CALCULATED 8 8   < > = values in this interval not displayed.     Hematology Recent Labs  Lab 02/11/21 1111 02/14/21 0643 02/15/21 0013  WBC 5.8 6.1 6.5  RBC 3.20* 3.35* 3.31*  HGB 8.1* 8.4* 8.5*  HCT 26.5* 27.3* 26.9*  MCV 82.8 81.5 81.3  MCH 25.3* 25.1* 25.7*  MCHC 30.6 30.8 31.6  RDW 15.3 15.7* 15.7*  PLT 270 323 358    Cardiac EnzymesNo results for input(s): TROPONINI in the last 168 hours. No results for input(s): TROPIPOC in the last 168 hours.   BNPNo results for input(s): BNP, PROBNP in the last 168 hours.   DDimer No results for input(s): DDIMER in the last 168 hours.   Radiology    DG CHEST PORT 1 VIEW  Result Date: 02/13/2021 CLINICAL DATA:  Increased shortness of breath. EXAM: PORTABLE CHEST 1 VIEW COMPARISON:  02/11/2021 FINDINGS: Unchanged cardiomediastinal contours. No significant change in the appearance of bilateral pleural effusions with mild diffuse interstitial edema. Areas of atelectasis are noted bilaterally, left greater than right. IMPRESSION: No change in aeration to the lungs compared with previous exam. Electronically  Signed   By: Kerby Moors M.D.   On: 02/13/2021 16:27   ECHOCARDIOGRAM LIMITED  Result Date: 02/14/2021    ECHOCARDIOGRAM LIMITED REPORT   Patient Name:   Mandy [name] Date of Exam: 02/14/2021 Medical Rec #:  [id number]      Height:       59.0 in Accession #:    [id number]     Weight:       154.8 lb Date of Birth:  03-Nov-1940       BSA:          1.654 m Patient Age:    81 years       BP:           139/65 mmHg Patient Gender: F              HR:           86 bpm. Exam Location:  Inpatient Procedure: Limited Echo, 3D Echo, Cardiac Doppler and Color Doppler Indications:    122-I22.9 Subsequent ST elevation (STEM) and non-ST elevation                 (NSTEMI) myocardial infarction  History:        Patient has prior history of Echocardiogram examinations, most                 recent 02/05/2021. CHF, Pulmonary HTN and Stroke,                 Signs/Symptoms:Chest Pain, Dizziness/Lightheadedness, Shortness  of Breath and Dyspnea; Risk Factors:Diabetes, Hypertension and                 Dyslipidemia.  Sonographer:    Roseanna Rainbow RDCS Referring Phys: [id number] Freada Bergeron  Sonographer Comments: Patient is morbidly obese. Image acquisition challenging due to patient body habitus. IMPRESSIONS  1. Left ventricular ejection fraction, by estimation, is 55 to 60%. Left ventricular ejection fraction by 3D volume is 55 %. The left ventricle has normal function. The left ventricle has no regional wall motion abnormalities. There is moderate concentric left ventricular hypertrophy.  2. Right ventricular systolic function was not well visualized. The right ventricular size is normal. There is severely elevated pulmonary artery systolic pressure. The estimated right ventricular systolic pressure is 82.9 mmHg.  3. The pericardial effusion is circumferential. Moderate pleural effusion in the left lateral region.  4. The mitral valve is normal in structure. Trivial mitral valve regurgitation. No evidence of mitral  stenosis.  5. Tricuspid valve regurgitation is moderate.  6. The aortic valve is normal in structure. Aortic valve regurgitation is not visualized. No aortic stenosis is present.  7. The inferior vena cava is normal in size with greater than 50% respiratory variability, suggesting right atrial pressure of 3 mmHg.  8. Compared to study dated 02/05/21,the IVC is no longer dilated and has normal respiratory collapse > 50%. TR is moderate and PASP has decreased from 170mmHg to 3mmHg. FINDINGS  Left Ventricle: Left ventricular ejection fraction, by estimation, is 55 to 60%. Left ventricular ejection fraction by 3D volume is 55 %. The left ventricle has normal function. The left ventricle has no regional wall motion abnormalities. The left ventricular internal cavity size was normal in size. There is moderate concentric left ventricular hypertrophy. Right Ventricle: The right ventricular size is normal. No increase in right ventricular wall thickness. Right ventricular systolic function was not well visualized. There is severely elevated pulmonary artery systolic pressure. The tricuspid regurgitant velocity is 3.82 m/s, and with an assumed right atrial pressure of 3 mmHg, the estimated right ventricular systolic pressure is 93.7 mmHg. Left Atrium: Left atrial size was normal in size. Right Atrium: Right atrial size was normal in size. Pericardium: Trivial pericardial effusion is present. The pericardial effusion is circumferential. Mitral Valve: The mitral valve is normal in structure. Trivial mitral valve regurgitation. No evidence of mitral valve stenosis. Tricuspid Valve: The tricuspid valve is normal in structure. Tricuspid valve regurgitation is moderate . No evidence of tricuspid stenosis. Aortic Valve: The aortic valve is normal in structure. Aortic valve regurgitation is not visualized. No aortic stenosis is present. Pulmonic Valve: The pulmonic valve was normal in structure. Pulmonic valve regurgitation is  trivial. No evidence of pulmonic stenosis. Aorta: The aortic root is normal in size and structure. Venous: The inferior vena cava is normal in size with greater than 50% respiratory variability, suggesting right atrial pressure of 3 mmHg. IAS/Shunts: No atrial level shunt detected by color flow Doppler. Additional Comments: There is a moderate pleural effusion in the left lateral region. LEFT VENTRICLE PLAX 2D LVIDd:         4.10 cm LVIDs:         2.70 cm LV PW:         1.30 cm         3D Volume EF LV IVS:        1.50 cm         LV 3D EF:    Left  ventricul                                             ar LV Volumes (MOD)                            ejection LV vol d, MOD    67.2 ml                    fraction A2C:                                        by 3D LV vol d, MOD    62.7 ml                    volume is A4C:                                        55 %. LV vol s, MOD    32.2 ml A2C: LV vol s, MOD    25.6 ml       3D Volume EF: A4C:                           3D EF:        55 % LV SV MOD A2C:   35.0 ml       LV EDV:       98 ml LV SV MOD A4C:   62.7 ml       LV ESV:       45 ml LV SV MOD BP:    35.7 ml       LV SV:        54 ml IVC IVC diam: 1.70 cm LEFT ATRIUM         Index LA diam:    3.30 cm 2.00 cm/m                        PULMONIC VALVE AORTA                 PR End Diast Vel: 1.96 msec Ao Root diam: 3.30 cm Ao Asc diam:  3.50 cm TRICUSPID VALVE TR Peak grad:   58.4 mmHg TR Vmax:        382.00 cm/s Fransico Him MD Electronically signed by Fransico Him MD Signature Date/Time: 02/14/2021/2:59:34 PM    Final     Cardiac Studies   Echo:   1. Left ventricular ejection fraction, by estimation, is 55 to 60%. Left  ventricular ejection fraction by 3D volume is 55 %. The left ventricle has  normal function. The left ventricle has no regional wall motion  abnormalities. There is moderate  concentric left ventricular hypertrophy.   2. Right ventricular systolic  function was not well visualized. The right  ventricular size is normal. There is severely elevated pulmonary artery  systolic pressure. The estimated right ventricular systolic pressure is  38.1 mmHg.   3. The pericardial effusion is circumferential. Moderate pleural effusion  in the left lateral region.   4. The mitral valve  is normal in structure. Trivial mitral valve  regurgitation. No evidence of mitral stenosis.   5. Tricuspid valve regurgitation is moderate.   6. The aortic valve is normal in structure. Aortic valve regurgitation is  not visualized. No aortic stenosis is present.   7. The inferior vena cava is normal in size with greater than 50%  respiratory variability, suggesting right atrial pressure of 3 mmHg.   8. Compared to study dated 02/05/21,the IVC is no longer dilated and has  normal respiratory collapse > 50%. TR is moderate and PASP has decreased  from 143mmHg to 20mmHg.   Patient Profile     81 y.o. 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 Has hx of prior Covid PNA with hypoxemia in 02/2019 as well as 06/2020. 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 Between 09/2020 and now has had multiple readmissions for recurrent a/c respiratory failure and pleural effusions. Admitted with same 02/03/21, transported from sleep center with hypoxia  Assessment & Plan    NSTEMI:   No plan for repeat invasive evaluation.  Medical management.  Heparint to continue x 48 hours.  Increased Coreg.  Echo with no change in LV function. Continue beta blocker and Plavix  ACUTE RESPIRATORY FAILURE:  Severe pulmonary HTN.  Continue sildenafil.    Doing OK to day  on [place].     HTN:  Her BPs have been very labile.  I will likely increase the beta blocker in the AM if BP allows.   For questions or updates, please contact [place] Please consult [email or web address] for contact info under Cardiology/STEMI.   Signed, Minus Breeding, MD  02/15/2021, 9:42 AM

## 2021-02-15 NOTE — Progress Notes (Signed)
°   02/15/21 1435  Clinical Encounter Type  Visited With Patient  Visit Type Initial;Spiritual support;Social support  Referral From Physician  Consult/Referral To [place] checked-in on Pt's well-being. Per Dr. Domingo Cocking the Pt would ideally need an interpreter but was able to engage somewhat. [place] offered hospitality (assistance with item from her lunch tray). Will attempt to have a further conversation with daughter and/or with interpreter assistance.

## 2021-02-15 NOTE — Progress Notes (Signed)
Progress Note    Mandy [name]  TMH:[id number] DOB: [birth year]  DOA: 02/02/2021 PCP: [name], MD      Brief Narrative:    Medical records reviewed and are as summarized below:  Mandy [name] is a 81 y.o. female with PMH significant for severe pulmonary hypertension, hyperlipidemia, hypertension, diabetes mellitus, recent pleural effusions, goiter, hyperthyroidism, chronic diastolic CHF, who presented to the ED 02/03/2021 after desaturating during  a sleep study to 30% for about 10 minutes.  She had increased pleural effusions on CXR and was requiring 4L O2.  She was admitted to telemetry and treated with IV Lasix.   On 2/2 she was increasingly dyspneic and ABG showed pH 7.3 with pCO2 of 77 and PCCM was consulted.       Significant Hospital Events: Including procedures, antibiotic start and stop dates in addition to other pertinent events   2/1 admit to hospitalists 2/2 PCCM consult for hypercarbia 2/3 Patient had loss of consciousness with O2 desaturation with movement 2/5 milrinone dose titrated down, tolerated well 2/6 milrinone off, started sildenafil 2/9 thoracentesis 600cc, probably pseudoexudate in setting diuresis (exudative by protein only), cyto pending 2/11 she complained of chest pain and troponins were elevated to 1,232 and 1,166.  She was started on IV heparin drip     Assessment/Plan:   Principal Problem:   Acute on chronic respiratory failure with hypoxia and hypercapnia (HCC) Active Problems:   Diabetes mellitus type 2, controlled ([place])   Hyperthyroidism   Hyperkalemia   Acute renal failure superimposed on stage 3a chronic kidney disease (HCC)   Severe pulmonary hypertension (HCC)   Acute on chronic diastolic CHF (congestive heart failure) (HCC)    Body mass index is 31.53 kg/m.  (Obesity)   #Chest Pain on 2/11: #NSTEMI: Troponin went up to 1,232 and 1,166.  Recent cath on 11/22/2020 showed 30% LAD disease and nonobstructive  disease. Continue IV heparin drip with plan to discontinue at 5 PM today.  Repeat 2D echo showed EF estimated at 55 to 60%, severe pulmonary hypertension, left moderate pleural effusion. Continue Plavix, Coreg and Lipitor Not a good candidate for cardiac cath because of severe pulmonary hypertension per cardiologist.   #Acute on Chronic Hypoxic/Hypercapneic Respiratory Failure #Acute on Chronic Diastolic HF: #Severe Pulmonary HTN WHO Group II/III: #Bilateral Pleural Effusions: TTE on this admission with EF 60-65%, G1DD, severe pulmonary HTN with PASP 118mmHg. Now off milrinone and transitioned to sildenafil. V/Q scan negative for CTEPH. S/p right-sided thoracentesis on 02/11/2021 with removal of 600 cc of fluid. Ordered left-sided thoracentesis.  Oxygen requirement has been tapered down from 15 to 7 L/min via nasal cannula Continue sildenafil and torsemide Use BiPAP as needed and at night if she agrees    #HTN: Continue antihypertensives   #AKI on CKD 3A: Improved.  Continue torsemide   #History of cryptogenic stroke, nonobstructive CAD: -Continue plavix 75mg  daily -Continue lipitor 80mg  daily   #Chronic Anemia: -Stable; trend  #Hyperkalemia and hypokalemia: Resolved     Diet Order             Diet Heart Room service appropriate? Yes; Fluid consistency: Thin; Fluid restriction: 2000 mL Fluid  Diet effective now                     Consultants: Cardiologist Intensivist  Procedures: Right thoracentesis 02/11/2021    Medications:    arformoterol  15 mcg Nebulization BID   atorvastatin  80 mg Oral QPM   carvedilol  6.25 mg Oral BID WC   Chlorhexidine Gluconate Cloth  6 each Topical Daily   clopidogrel  75 mg Oral Daily   fluticasone  1 spray Each Nare Daily   gabapentin  100 mg Oral QHS   hydrALAZINE  25 mg Oral Q8H   mouth rinse  15 mL Mouth Rinse BID   melatonin  10 mg Oral QHS   methimazole  2.5 mg Oral Daily   pantoprazole  40 mg Oral q morning    revefenacin  175 mcg Nebulization Daily   sildenafil  20 mg Oral TID   sodium chloride flush  3 mL Intravenous Q12H   torsemide  40 mg Oral Daily   Continuous Infusions:  heparin 800 Units/hr (02/15/21 0400)     Anti-infectives (From admission, onward)    None              Family Communication/Anticipated D/C date and plan/Code Status   DVT prophylaxis:      Code Status: Full Code  Family Communication: None Disposition Plan: Plan to discharge to home versus SNF when medically stable   Status is: Inpatient Remains inpatient appropriate because: Acute NSTEMI, acute hypoxic respiratory failure               Subjective:   Interval events noted.  She complains of mild chest pain and shortness of breath.  Objective:    Vitals:   02/15/21 0855 02/15/21 0857 02/15/21 1000 02/15/21 1057  BP: (!) 128/55 (!) 128/55 126/67   Pulse:  (!) 106    Resp: 18  20   Temp:      TempSrc:      SpO2:   (!) 85% 100%  Weight:      Height:       No data found.   Intake/Output Summary (Last 24 hours) at 02/15/2021 1107 Last data filed at 02/15/2021 1045 Gross per 24 hour  Intake 128.18 ml  Output 1000 ml  Net -871.82 ml   Filed Weights   02/13/21 0500 02/14/21 0500 02/15/21 0500  Weight: 69.3 kg 70.2 kg 70.8 kg    Exam:  GEN: NAD SKIN: No rash EYES: EOMI ENT: MMM CV: RRR PULM: CTA B ABD: soft, ND, NT, +BS CNS: AAO x 3, non focal EXT: No edema or tenderness     Data Reviewed:   I have personally reviewed following labs and imaging studies:  Labs: Labs show the following:   Basic Metabolic Panel: Recent Labs  Lab 02/09/21 0302 02/09/21 0748 02/10/21 0636 02/11/21 1111 02/12/21 0315 02/13/21 0257 02/14/21 0643 02/15/21 0013  NA 134*   < > 133* 135 136 138 136 136  K 4.4   < > 3.8 2.9* 3.9 3.9 3.7 3.9  CL 81*   < > 81* 80* 83* 84* 85* 86*  CO2 44*   < > >45* >45* >45* >45* 43* 42*  GLUCOSE 122*   < > 122* 189* 120* 111* 109* 124*   BUN 63*   < > 69* 66* 53* 49* 35* 38*  CREATININE 3.70*   < > 2.90* 2.00* 1.64* 1.79* 1.29* 1.29*  CALCIUM 8.5*   < > 8.5* 8.5* 8.4* 8.5* 8.8* 9.0  MG 2.7*  --  2.8* 2.6*  --   --   --   --    < > = values in this interval not displayed.   GFR Estimated Creatinine Clearance: 30.3 mL/min (A) (by C-G formula based on SCr of 1.29 mg/dL (H)). Liver Function  Tests: Recent Labs  Lab 02/09/21 0748 02/11/21 1627  AST 32  --   ALT 19  --   ALKPHOS 47  --   BILITOT 0.3  --   PROT 6.3*  --   ALBUMIN 3.2* 3.2*   No results for input(s): LIPASE, AMYLASE in the last 168 hours. No results for input(s): AMMONIA in the last 168 hours. Coagulation profile No results for input(s): INR, PROTIME in the last 168 hours.  CBC: Recent Labs  Lab 02/09/21 0302 02/10/21 0636 02/11/21 1111 02/14/21 0643 02/15/21 0013  WBC 6.8 5.5 5.8 6.1 6.5  HGB 7.6* 7.7* 8.1* 8.4* 8.5*  HCT 24.5* 25.1* 26.5* 27.3* 26.9*  MCV 81.9 83.4 82.8 81.5 81.3  PLT 218 240 270 323 358   Cardiac Enzymes: No results for input(s): CKTOTAL, CKMB, CKMBINDEX, TROPONINI in the last 168 hours. BNP (last 3 results) Recent Labs    01/14/21 1653  PROBNP 443.0*   CBG: Recent Labs  Lab 02/14/21 1937 02/15/21 0021  GLUCAP 151* 121*   D-Dimer: No results for input(s): DDIMER in the last 72 hours. Hgb A1c: No results for input(s): HGBA1C in the last 72 hours. Lipid Profile: No results for input(s): CHOL, HDL, LDLCALC, TRIG, CHOLHDL, LDLDIRECT in the last 72 hours. Thyroid function studies: No results for input(s): TSH, T4TOTAL, T3FREE, THYROIDAB in the last 72 hours.  Invalid input(s): FREET3 Anemia work up: No results for input(s): VITAMINB12, FOLATE, FERRITIN, TIBC, IRON, RETICCTPCT in the last 72 hours. Sepsis Labs: Recent Labs  Lab 02/10/21 0636 02/11/21 1111 02/14/21 0643 02/15/21 0013  WBC 5.5 5.8 6.1 6.5    Microbiology Recent Results (from the past 240 hour(s))  MRSA Next Gen by PCR, Nasal      Status: None   Collection Time: 02/08/21 11:59 PM   Specimen: Nasal Mucosa; Nasal Swab  Result Value Ref Range Status   MRSA by PCR Next Gen NOT DETECTED NOT DETECTED Final    Comment: (NOTE) The GeneXpert MRSA Assay (FDA approved for NASAL specimens only), is one component of a comprehensive MRSA colonization surveillance program. It is not intended to diagnose MRSA infection nor to guide or monitor treatment for MRSA infections. Test performance is not FDA approved in patients less than 84 years old. Performed at [facility], [place] [address] [place], [place] [address]   Body fluid culture w Gram Stain     Status: None   Collection Time: 02/11/21  4:24 PM   Specimen: Pleural Fluid  Result Value Ref Range Status   Specimen Description   Final    PLEURAL Performed at [facility] [address], [place], [place] [address]    Special Requests   Final    NONE Performed at [facility], [place] [address] [place], [place] [address]    Gram Stain   Final    NO SQUAMOUS EPITHELIAL CELLS SEEN FEW WBC SEEN NO ORGANISMS SEEN    Culture   Final    NO GROWTH 3 DAYS Performed at [facility], [place] [address] [place], [place] [address]    Report Status 02/15/2021 FINAL  Final  Fungus Culture With Stain     Status: None (Preliminary result)   Collection Time: 02/11/21  4:25 PM   Specimen: Pleural Fluid  Result Value Ref Range Status   Fungus Stain Final report  Final    Comment: (NOTE) Performed At: [facility] [place], [place] [id number] Rush Farmer MD  ZW:[id number]    Fungus (Mycology) Culture PENDING  Incomplete   Fungal Source PLEURAL  Final    Comment: Performed at [facility], [place] [address] [place], [place] [address]  Fungus Culture Result     Status: None   Collection Time: 02/11/21  4:25 PM  Result Value Ref Range Status   Result 1 Comment  Final    Comment:  (NOTE) KOH/Calcofluor preparation:  no fungus observed. Performed At: [facility] [place], [place] [id number] Rush Farmer MD QQ:[id number]     Procedures and diagnostic studies:  DG CHEST PORT 1 VIEW  Result Date: 02/13/2021 CLINICAL DATA:  Increased shortness of breath. EXAM: PORTABLE CHEST 1 VIEW COMPARISON:  02/11/2021 FINDINGS: Unchanged cardiomediastinal contours. No significant change in the appearance of bilateral pleural effusions with mild diffuse interstitial edema. Areas of atelectasis are noted bilaterally, left greater than right. IMPRESSION: No change in aeration to the lungs compared with previous exam. Electronically Signed   By: Kerby Moors M.D.   On: 02/13/2021 16:27   ECHOCARDIOGRAM LIMITED  Result Date: 02/14/2021    ECHOCARDIOGRAM LIMITED REPORT   Patient Name:   Mandy [name] Date of Exam: 02/14/2021 Medical Rec #:  [id number]      Height:       59.0 in Accession #:    [id number]     Weight:       154.8 lb Date of Birth:  Oct 12, 1940       BSA:          1.654 m Patient Age:    9 years       BP:           139/65 mmHg Patient Gender: F              HR:           86 bpm. Exam Location:  Inpatient Procedure: Limited Echo, 3D Echo, Cardiac Doppler and Color Doppler Indications:    122-I22.9 Subsequent ST elevation (STEM) and non-ST elevation                 (NSTEMI) myocardial infarction  History:        Patient has prior history of Echocardiogram examinations, most                 recent 02/05/2021. CHF, Pulmonary HTN and Stroke,                 Signs/Symptoms:Chest Pain, Dizziness/Lightheadedness, Shortness                 of Breath and Dyspnea; Risk Factors:Diabetes, Hypertension and                 Dyslipidemia.  Sonographer:    Roseanna Rainbow RDCS Referring Phys: [id number] Freada Bergeron  Sonographer Comments: Patient is morbidly obese. Image acquisition challenging due to patient body habitus. IMPRESSIONS  1. Left ventricular ejection fraction, by  estimation, is 55 to 60%. Left ventricular ejection fraction by 3D volume is 55 %. The left ventricle has normal function. The left ventricle has no regional wall motion abnormalities. There is moderate concentric left ventricular hypertrophy.  2. Right ventricular systolic function was not well visualized. The right ventricular size is normal. There is severely elevated pulmonary artery systolic pressure. The estimated right ventricular systolic pressure is 37.9 mmHg.  3. The pericardial effusion is circumferential. Moderate pleural effusion in the left lateral region.  4. The mitral valve is normal in structure.  Trivial mitral valve regurgitation. No evidence of mitral stenosis.  5. Tricuspid valve regurgitation is moderate.  6. The aortic valve is normal in structure. Aortic valve regurgitation is not visualized. No aortic stenosis is present.  7. The inferior vena cava is normal in size with greater than 50% respiratory variability, suggesting right atrial pressure of 3 mmHg.  8. Compared to study dated 02/05/21,the IVC is no longer dilated and has normal respiratory collapse > 50%. TR is moderate and PASP has decreased from 172mmHg to 27mmHg. FINDINGS  Left Ventricle: Left ventricular ejection fraction, by estimation, is 55 to 60%. Left ventricular ejection fraction by 3D volume is 55 %. The left ventricle has normal function. The left ventricle has no regional wall motion abnormalities. The left ventricular internal cavity size was normal in size. There is moderate concentric left ventricular hypertrophy. Right Ventricle: The right ventricular size is normal. No increase in right ventricular wall thickness. Right ventricular systolic function was not well visualized. There is severely elevated pulmonary artery systolic pressure. The tricuspid regurgitant velocity is 3.82 m/s, and with an assumed right atrial pressure of 3 mmHg, the estimated right ventricular systolic pressure is 29.5 mmHg. Left Atrium: Left  atrial size was normal in size. Right Atrium: Right atrial size was normal in size. Pericardium: Trivial pericardial effusion is present. The pericardial effusion is circumferential. Mitral Valve: The mitral valve is normal in structure. Trivial mitral valve regurgitation. No evidence of mitral valve stenosis. Tricuspid Valve: The tricuspid valve is normal in structure. Tricuspid valve regurgitation is moderate . No evidence of tricuspid stenosis. Aortic Valve: The aortic valve is normal in structure. Aortic valve regurgitation is not visualized. No aortic stenosis is present. Pulmonic Valve: The pulmonic valve was normal in structure. Pulmonic valve regurgitation is trivial. No evidence of pulmonic stenosis. Aorta: The aortic root is normal in size and structure. Venous: The inferior vena cava is normal in size with greater than 50% respiratory variability, suggesting right atrial pressure of 3 mmHg. IAS/Shunts: No atrial level shunt detected by color flow Doppler. Additional Comments: There is a moderate pleural effusion in the left lateral region. LEFT VENTRICLE PLAX 2D LVIDd:         4.10 cm LVIDs:         2.70 cm LV PW:         1.30 cm         3D Volume EF LV IVS:        1.50 cm         LV 3D EF:    Left                                             ventricul                                             ar LV Volumes (MOD)                            ejection LV vol d, MOD    67.2 ml                    fraction A2C:  by 3D LV vol d, MOD    62.7 ml                    volume is A4C:                                        55 %. LV vol s, MOD    32.2 ml A2C: LV vol s, MOD    25.6 ml       3D Volume EF: A4C:                           3D EF:        55 % LV SV MOD A2C:   35.0 ml       LV EDV:       98 ml LV SV MOD A4C:   62.7 ml       LV ESV:       45 ml LV SV MOD BP:    35.7 ml       LV SV:        54 ml IVC IVC diam: 1.70 cm LEFT ATRIUM         Index LA diam:    3.30 cm 2.00 cm/m                         PULMONIC VALVE AORTA                 PR End Diast Vel: 1.96 msec Ao Root diam: 3.30 cm Ao Asc diam:  3.50 cm TRICUSPID VALVE TR Peak grad:   58.4 mmHg TR Vmax:        382.00 cm/s Fransico Him MD Electronically signed by Fransico Him MD Signature Date/Time: 02/14/2021/2:59:34 PM    Final                LOS: 12 days   Jolee Critcher  [facility]   Pager on [email or web address]. If 7PM-7AM, please contact night-coverage at [email or web address]     02/15/2021, 11:07 AM

## 2021-02-15 NOTE — Progress Notes (Signed)
Daily Progress Note   Patient Name: Mandy [name]       Date: 02/15/2021 DOB: 1940-04-25  Age: 81 y.o. MRN#: [id number] Attending Physician: Jennye Boroughs, MD Primary Care Physician: [name], MD Admit Date: 02/02/2021  Reason for Consultation/Follow-up: Establishing goals of care  Subjective: I saw and examined Ms. Dirocco.  She was lying in bed in no distress.  Her daughter was not present at the bedside.  She had a dinner tray and was picking at some fruit at time of my encounter.  Limited conversation due to no interpretation services available, but she does deny having any pain or shortness of breath.  When asking how things are going overall, she stated, "Feeling better.  Thank you for coming."  I asked if she had thought more about our conversation yesterday and she stated, "we will keep going."  Length of Stay: 12  Current Medications: Scheduled Meds:   arformoterol  15 mcg Nebulization BID   atorvastatin  80 mg Oral QPM   carvedilol  6.25 mg Oral BID WC   Chlorhexidine Gluconate Cloth  6 each Topical Daily   clopidogrel  75 mg Oral Daily   fluticasone  1 spray Each Nare Daily   gabapentin  100 mg Oral QHS   hydrALAZINE  25 mg Oral Q8H   mouth rinse  15 mL Mouth Rinse BID   melatonin  10 mg Oral QHS   methimazole  2.5 mg Oral Daily   pantoprazole  40 mg Oral q morning   revefenacin  175 mcg Nebulization Daily   sildenafil  20 mg Oral TID   sodium chloride flush  3 mL Intravenous Q12H   torsemide  40 mg Oral Daily    Continuous Infusions:  heparin 900 Units/hr (02/15/21 1710)    PRN Meds: acetaminophen **OR** acetaminophen, albuterol, alum & mag hydroxide-simeth, docusate sodium, hydrALAZINE, morphine injection, ondansetron (ZOFRAN) IV, sodium chloride,  traMADol  Physical Exam         General: Sitting in bed, awake and alert HEENT: No bruits, no goiter, no JVD Heart: Regular rate and rhythm. No murmur appreciated. Lungs: Good air movement, scattered coarse  Abdomen: Soft, nontender, nondistended, positive bowel sounds.   Ext: No significant edema Skin: Warm and dry  Vital Signs: BP (!) 122/49    Pulse 94  Temp 98.3 F (36.8 C) (Oral)    Resp 20    Ht 4\' 11"  (1.499 m)    Wt 70.8 kg    SpO2 100%    BMI 31.53 kg/m  SpO2: SpO2: 100 % O2 Device: O2 Device: Nasal Cannula O2 Flow Rate: O2 Flow Rate (L/min): 4 L/min  Intake/output summary:  Intake/Output Summary (Last 24 hours) at 02/15/2021 1855 Last data filed at 02/15/2021 1710 Gross per 24 hour  Intake 544.77 ml  Output 1300 ml  Net -755.23 ml    LBM: Last BM Date: 02/13/21 Baseline Weight: Weight: 74.8 kg Most recent weight: Weight: 70.8 kg    Patient Active Problem List   Diagnosis Date Noted   Acute on chronic diastolic CHF (congestive heart failure) ([place]) 02/03/2021   Pressure injury of skin 12/16/2020   CHF exacerbation ([place]) 12/14/2020   Cerebral thrombosis with cerebral infarction 12/07/2020   CVA (cerebral vascular accident) ([place]) 12/06/2020   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[place]) 12/03/2020   Chest pain of uncertain etiology    Severe pulmonary hypertension ([place])    Chronic respiratory failure with hypoxia, on home oxygen therapy (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   Acute respiratory failure with hypoxia ([place]) 06/26/2020   Hypertensive urgency 06/26/2020    Generalized weakness 06/26/2020   Dizziness 06/26/2020   Obesity (BMI 30-39.9) 06/26/2020   Hyperthyroidism 06/26/2020   Chronic diastolic CHF (congestive heart failure) ([place]) 06/26/2020   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  PCCM was consulted for hypercarbia and she was started on milrinone.  She had thoracentesis followed a couple of days later by chest pain with elevated troponins.  Palliative consulted for goals of care previously and goal was for full code/full scope at that time.  Palliative asked to reevaluate again due to NSTEMI.  Recommendations/Plan: Full code/full scope She and her daughter have been very consistent and requesting any medical interventions that are offered.  Yesterday, her daughter reported they had discussion about goals and she was clear and desiring CPR and mechanical ventilation if needed.  We will continue to follow and support intermittently, but I doubt this is something that is going to change in the immediate future, if at all.  There may be a cultural component to this as they shut down very quickly and do not really engage in any conversation about possibility other than her full recovery.  Goals of Care and  Additional Recommendations: Limitations on Scope of Treatment: Full  Scope Treatment  Prognosis: Guarded  Discharge Planning: To Be Determined  Care plan was discussed with patient  Thank you for allowing the Palliative Medicine Team to assist in the care of this patient.  Micheline Rough, MD  Please contact Palliative Medicine Team phone at [phone] for questions and concerns.

## 2021-02-15 NOTE — Progress Notes (Addendum)
ANTICOAGULATION CONSULT NOTE   Pharmacy Consult for heparin Indication: chest pain/ACS  No Known Allergies  Patient Measurements: Height: 4\' 11"  (149.9 cm) Weight: 70.8 kg (156 lb 1.4 oz) IBW/kg (Calculated) : 43.2 Heparin Dosing Weight: 69 kg  Vital Signs: Temp: 98.1 F (36.7 C) (02/13 2013) Temp Source: Oral (02/13 2013) BP: 136/50 (02/13 1922) Pulse Rate: 94 (02/13 1600)  Labs: Recent Labs    02/13/21 0257 02/13/21 1522 02/13/21 1721 02/14/21 0643 02/14/21 1651 02/15/21 0013 02/15/21 1020 02/15/21 2046  HGB  --   --   --  8.4*  --  8.5*  --   --   HCT  --   --   --  27.3*  --  26.9*  --   --   PLT  --   --   --  323  --  358  --   --   HEPARINUNFRC  --   --   --  >1.10*   < > 0.22* 0.25* 0.48  CREATININE 1.79*  --   --  1.29*  --  1.29*  --   --   TROPONINIHS  --  1,232* 1,166*  --   --   --   --   --    < > = values in this interval not displayed.     Estimated Creatinine Clearance: 30.3 mL/min (A) (by C-G formula based on SCr of 1.29 mg/dL (H)).   Assessment: Patient's a 81 y.o F with hx HF, pulmonary HTN, chronic hypoxic and hypercarbic respiratory failure, CKD presented to the ED on 02/02/21 fwith c/o SOB. She developed CP on 02/13/21 with elevated troponin. Pharmacy has been consulted to dose heparin for ACS.  02/15/2021 PM HL therapeutic on heparin 900 units/hr Hgb low but stable, plts stable WNL SCr down ~30% since starting heparin Per RN no bleeding or infusion issues  Goal of Therapy:  Heparin level 0.3-0.7 units/ml Monitor platelets by anticoagulation protocol: Yes   Plan:  Continue heparin infusion at 900 units/hr - per Cards, plan is for medical management; heparin x 48 hr > f/u on 2/14 if can d/c heparin drip Check HL in 8 hours with daily labs daily CBC Monitor for s/sx bleeding  Dimple Nanas, PharmD 02/15/2021 9:08 PM

## 2021-02-15 NOTE — Plan of Care (Signed)
  Problem: Coping: Goal: Level of anxiety will decrease Outcome: Progressing   Problem: Pain Managment: Goal: General experience of comfort will improve Outcome: Progressing   Problem: Safety: Goal: Ability to remain free from injury will improve Outcome: Progressing   

## 2021-02-15 NOTE — Progress Notes (Signed)
[place] for heparin Indication: chest pain/ACS  No Known Allergies  Patient Measurements: Height: 4\' 11"  (149.9 cm) Weight: 70.8 kg (156 lb 1.4 oz) IBW/kg (Calculated) : 43.2 Heparin Dosing Weight: 69 kg  Vital Signs: Temp: 98.2 F (36.8 C) (02/13 0736) Temp Source: Oral (02/13 0736) BP: 126/67 (02/13 1000) Pulse Rate: 106 (02/13 0857)  Labs: Recent Labs    02/13/21 0257 02/13/21 1522 02/13/21 1721 02/14/21 2683 02/14/21 0643 02/14/21 1651 02/15/21 0013 02/15/21 1020  HGB  --   --   --  8.4*  --   --  8.5*  --   HCT  --   --   --  27.3*  --   --  26.9*  --   PLT  --   --   --  323  --   --  358  --   HEPARINUNFRC  --   --   --  >1.10*   < > 0.48 0.22* 0.25*  CREATININE 1.79*  --   --  1.29*  --   --  1.29*  --   TROPONINIHS  --  1,232* 1,166*  --   --   --   --   --    < > = values in this interval not displayed.     Estimated Creatinine Clearance: 30.3 mL/min (A) (by C-G formula based on SCr of 1.29 mg/dL (H)).   Assessment: Patient's a 81 y.o F with hx HF, pulmonary HTN, chronic hypoxic and hypercarbic respiratory failure, CKD presented to the ED on 02/02/21 fwith c/o SOB. She developed CP on 02/13/21 with elevated troponin. Pharmacy has been consulted to dose heparin for ACS.  02/15/2021 HL slightly improved but still subtherapeutic on 800 units/hr (was supra-therapeutic on 900 units/hr, although unable to confirm proper lab technique) Hgb low but stable, plts stable WNL SCr down ~30% since starting heparin Per RN no bleeding or infusion issues  Goal of Therapy:  Heparin level 0.3-0.7 units/ml Monitor platelets by anticoagulation protocol: Yes   Plan:  Increase heparin infusion to units/hr - per Cards, plan is for medical management; heparin x 48 hr (although has yet to be consistently therapeutic) Check 8 hr heparin level daily CBC Monitor for s/sx bleeding  Reuel Boom, PharmD, BCPS [phone] 02/15/2021,  12:23 PM

## 2021-02-15 NOTE — Progress Notes (Signed)
[facility] for heparin Indication: chest pain/ACS  No Known Allergies  Patient Measurements: Height: 4\' 11"  (149.9 cm) Weight: 70.2 kg (154 lb 12.2 oz) IBW/kg (Calculated) : 43.2 Heparin Dosing Weight: 69 kg  Vital Signs: Temp: 98.1 F (36.7 C) (02/13 0000) BP: 152/70 (02/13 0004) Pulse Rate: 95 (02/13 0004)  Labs: Recent Labs    02/13/21 0257 02/13/21 1522 02/13/21 1721 02/14/21 0643 02/14/21 1651 02/15/21 0013  HGB  --   --   --  8.4*  --  8.5*  HCT  --   --   --  27.3*  --  26.9*  PLT  --   --   --  323  --  358  HEPARINUNFRC  --   --   --  >1.10* 0.48 0.22*  CREATININE 1.79*  --   --  1.29*  --  1.29*  TROPONINIHS  --  1,232* 1,166*  --   --   --      Estimated Creatinine Clearance: 30.1 mL/min (A) (by C-G formula based on SCr of 1.29 mg/dL (H)).   Assessment: Patient's a 81 y.o F with hx HF, pulmonary HTN, chronic hypoxic and hypercarbic respiratory failure, CKD presented to the ED on 02/02/21 fwith c/o SOB. She developed CP on 02/13/21 with elevated troponin. Pharmacy has been consulted to dose heparin for ACS.  02/15/2021 HL 0.22 subtherapeutic on 750 units/hr (was supra-therapeutic on 900 units/hr) Hgb low but stable, plts WNL Scr down to 1.29 Per RN no bleeding or interruptions   Goal of Therapy:  Heparin level 0.3-0.7 units/ml Monitor platelets by anticoagulation protocol: Yes   Plan:  - increase heparin drip to 800 units/hr - Check 8 hr  heparin level - daily CBC - Monitor for s/sx bleeding  Dolly Rias RPh 02/15/2021, 2:20 AM

## 2021-02-16 ENCOUNTER — Other Ambulatory Visit (HOSPITAL_COMMUNITY): Payer: Medicare HMO

## 2021-02-16 ENCOUNTER — Inpatient Hospital Stay (HOSPITAL_COMMUNITY): Payer: Medicare HMO

## 2021-02-16 DIAGNOSIS — J9621 Acute and chronic respiratory failure with hypoxia: Secondary | ICD-10-CM | POA: Diagnosis not present

## 2021-02-16 DIAGNOSIS — J9622 Acute and chronic respiratory failure with hypercapnia: Secondary | ICD-10-CM | POA: Diagnosis not present

## 2021-02-16 LAB — BASIC METABOLIC PANEL
Anion gap: 8 (ref 5–15)
BUN: 40 mg/dL — ABNORMAL HIGH (ref 8–23)
CO2: 40 mmol/L — ABNORMAL HIGH (ref 22–32)
Calcium: 8.8 mg/dL — ABNORMAL LOW (ref 8.9–10.3)
Chloride: 86 mmol/L — ABNORMAL LOW (ref 98–111)
Creatinine, Ser: 1.35 mg/dL — ABNORMAL HIGH (ref 0.44–1.00)
GFR, Estimated: 40 mL/min — ABNORMAL LOW (ref >60)
Glucose, Bld: 107 mg/dL — ABNORMAL HIGH (ref 70–99)
Potassium: 4 mmol/L (ref 3.5–5.1)
Sodium: 134 mmol/L — ABNORMAL LOW (ref 135–145)

## 2021-02-16 LAB — CBC
HCT: 26.9 % — ABNORMAL LOW (ref 36.0–46.0)
Hemoglobin: 8.3 g/dL — ABNORMAL LOW (ref 12.0–15.0)
MCH: 24.7 pg — ABNORMAL LOW (ref 26.0–34.0)
MCHC: 30.9 g/dL (ref 30.0–36.0)
MCV: 80.1 fL (ref 80.0–100.0)
Platelets: 325 10*3/uL (ref 150–400)
RBC: 3.36 MIL/uL — ABNORMAL LOW (ref 3.87–5.11)
RDW: 15.9 % — ABNORMAL HIGH (ref 11.5–15.5)
WBC: 6.3 10*3/uL (ref 4.0–10.5)
nRBC: 0 % (ref 0.0–0.2)

## 2021-02-16 LAB — CYTOLOGY - NON PAP

## 2021-02-16 LAB — COOXEMETRY PANEL
Carboxyhemoglobin: 1.7 % — ABNORMAL HIGH (ref 0.5–1.5)
Methemoglobin: 1.3 % (ref 0.0–1.5)
O2 Saturation: 71.3 %
Total hemoglobin: 8.6 g/dL — ABNORMAL LOW (ref 12.0–16.0)

## 2021-02-16 LAB — HEPARIN LEVEL (UNFRACTIONATED): Heparin Unfractionated: 0.49 IU/mL (ref 0.30–0.70)

## 2021-02-16 MED ORDER — CARVEDILOL 12.5 MG PO TABS: 12.5000 mg | ORAL_TABLET | Freq: Two times a day (BID) | ORAL | Status: DC

## 2021-02-16 MED ORDER — LIDOCAINE HCL 1 % IJ SOLN
INTRAMUSCULAR | Status: AC
  Administered 2021-02-16: 15 mL
  Filled 2021-02-16: qty 20

## 2021-02-16 MED ORDER — HEPARIN SODIUM (PORCINE) 5000 UNIT/ML IJ SOLN
5000.0000 [IU] | Freq: Three times a day (TID) | INTRAMUSCULAR | Status: DC
  Administered 2021-02-16 – 2021-02-22 (×19): 5000 [IU] via SUBCUTANEOUS
  Filled 2021-02-16 (×19): qty 1

## 2021-02-16 MED ORDER — CARVEDILOL 6.25 MG PO TABS
6.2500 mg | ORAL_TABLET | Freq: Two times a day (BID) | ORAL | Status: DC
  Administered 2021-02-16: 6.25 mg via ORAL
  Filled 2021-02-16: qty 1

## 2021-02-16 MED ORDER — DIAZEPAM 2 MG PO TABS
1.0000 mg | ORAL_TABLET | Freq: Once | ORAL | Status: AC | PRN
  Administered 2021-02-16: 1 mg via ORAL
  Filled 2021-02-16: qty 1

## 2021-02-16 NOTE — Progress Notes (Signed)
Patient ID: Mandy [name], female   DOB: [birth year], 81 y.o.   MRN: [id number] Chest x-ray post right thoracentesis this afternoon revealed small right-sided pneumothorax, ? exvacuole.  Images were reviewed by Dr. Vernard Gambles.  He recommends follow-up chest x-ray in 3 hours or sooner if patient becomes symptomatic. Nurse updated. Pt currently stable per nurse.

## 2021-02-16 NOTE — Progress Notes (Signed)
Physical Therapy Treatment Patient Details Name: Mandy [name] MRN: [id number] DOB: 17-Feb-1940 Today's Date: 02/16/2021   History of Present Illness 81 yo female admitted with acute on chronic resp failure, acute on chronic HF, bil pleural effusion. Hx of anemia, severe pulm HTN, DM, HF, CVA, CAD, CKD. Pt is non English speaking-family interprets-hospital interpreter not available    PT Comments    Patient progressing well with mobility and required min guard/assist for transfers and short bouts of gait with RW. [name] on phone to assist with translation. VSS throughout with momentary drop in sats with activity. Given progress and language barrier discussed discharge plan with pt/daughter and updated recs to HHPT with assist from family. Acute PT will continue to progress mobility as able.   Recommendations for follow up therapy are one component of a multi-disciplinary discharge planning process, led by the attending physician.  Recommendations may be updated based on patient status, additional functional criteria and insurance authorization.  Follow Up Recommendations  Home health PT     Assistance Recommended at Discharge Frequent or constant Supervision/Assistance  Patient can return home with the following Assist for transportation;Help with stairs or ramp for entrance;Assistance with cooking/housework;A little help with walking and/or transfers;A little help with bathing/dressing/bathroom;Direct supervision/assist for medications management   Equipment Recommendations  None recommended by PT    Recommendations for Other Services       Precautions / Restrictions Precautions Precautions: Fall Restrictions Weight Bearing Restrictions: No     Mobility  Bed Mobility               General bed mobility comments: pt OOB in recliner    Transfers Overall transfer level: Needs assistance Equipment used: Rolling walker (2 wheels) Transfers: Sit to/from Stand Sit to  Stand: Min assist           General transfer comment: light assist to power up from recliner and to control lowering to toilet safely. pt using bil UE's for power up.    Ambulation/Gait Ambulation/Gait assistance: Min assist Gait Distance (Feet): 30 Feet Assistive device: Rolling walker (2 wheels) Gait Pattern/deviations: Step-to pattern, Decreased step length - right, Decreased step length - left, Decreased stride length, Shuffle, Trunk flexed Gait velocity: decr     General Gait Details: pt required cues for safe managment/proximity to RW throughout short bout to/from bathroom. HR elevated to 130 bpm with activity and pt returned to sit in recliner. SpO2 dropped into 80's on 2L/min and recovered to 92% with seated rest.   Stairs             Wheelchair Mobility    Modified Rankin (Stroke Patients Only)       Balance Overall balance assessment: Needs assistance Sitting-balance support: Feet unsupported Sitting balance-Leahy Scale: Fair     Standing balance support: Reliant on assistive device for balance, Bilateral upper extremity supported, During functional activity Standing balance-Leahy Scale: Poor                              Cognition Arousal/Alertness: Awake/alert Behavior During Therapy: Flat affect Overall Cognitive Status: Difficult to assess                                 General Comments: daughter on phone assisting with translation        Exercises      General Comments  Pertinent Vitals/Pain Pain Assessment Pain Assessment: No/denies pain    Home Living                          Prior Function            PT Goals (current goals can now be found in the care plan section) Acute Rehab PT Goals PT Goal Formulation: With patient/family Time For Goal Achievement: 02/23/21 Potential to Achieve Goals: Good Progress towards PT goals: Progressing toward goals    Frequency    Min  3X/week      PT Plan Current plan remains appropriate    Co-evaluation              AM-PAC PT "6 Clicks" Mobility   Outcome Measure  Help needed turning from your back to your side while in a flat bed without using bedrails?: A Little Help needed moving from lying on your back to sitting on the side of a flat bed without using bedrails?: A Little Help needed moving to and from a bed to a chair (including a wheelchair)?: A Little Help needed standing up from a chair using your arms (e.g., wheelchair or bedside chair)?: A Little Help needed to walk in hospital room?: A Little Help needed climbing 3-5 steps with a railing? : A Lot 6 Click Score: 17    End of Session Equipment Utilized During Treatment: Gait belt Activity Tolerance: Patient tolerated treatment well Patient left: in chair;with call bell/phone within reach;with chair alarm set;with family/visitor present Nurse Communication: Mobility status PT Visit Diagnosis: Muscle weakness (generalized) (M62.81);Difficulty in walking, not elsewhere classified (R26.2)     Time: 2694-8546 PT Time Calculation (min) (ACUTE ONLY): 34 min  Charges:  $Gait Training: 8-22 mins $Therapeutic Activity: 8-22 mins                     Verner Mould, DPT Acute Rehabilitation Services Office [phone] Pager [phone]    Jacques Navy 02/16/2021, 12:50 PM

## 2021-02-16 NOTE — Progress Notes (Signed)
Patient currently on BIPAP. BP is 115/43 (69) I have rescheduled her hydralazine for 0800 so that she could continue to sleep and wear the BIPAP as long as possible.

## 2021-02-16 NOTE — Progress Notes (Signed)
PROGRESS NOTE    Mandy [name]  YQM:[id number] DOB: July 03, 1940 DOA: 02/02/2021 PCP: [name], MD   Brief Narrative:   Mandy [name] is a 81 y.o. female with PMH significant for severe pulmonary hypertension, hyperlipidemia, hypertension, diabetes mellitus, recent pleural effusions, goiter, hyperthyroidism, chronic diastolic CHF, who presented to the ED 02/03/2021 after desaturating during  a sleep study to 30% for about 10 minutes.  She had increased pleural effusions on CXR and was requiring 4L O2.  She was admitted to telemetry and treated with IV Lasix.   On 2/2 she was increasingly dyspneic and ABG showed pH 7.3 with pCO2 of 77 and PCCM was consulted.     Significant Hospital Events: Including procedures, antibiotic start and stop dates in addition to other pertinent events   2/1 admit to hospitalists 2/2 PCCM consult for hypercarbia 2/3 Patient had loss of consciousness with O2 desaturation with movement 2/5 milrinone dose titrated down, tolerated well 2/6 milrinone off, started sildenafil 2/9 thoracentesis 600cc, probably pseudoexudate in setting diuresis (exudative by protein only), cyto pending 2/11 she complained of chest pain and troponins were elevated to 1,232 and 1,166.  She was started on IV heparin drip 2/14 heparin drip discontinued after 48 hours of conservative management per cardiology recommendations.  Plans for left-sided thoracentesis today.  PT evaluation ordered and pending  Assessment & Plan:   Principal Problem:   Acute on chronic respiratory failure with hypoxia and hypercapnia (HCC) Active Problems:   Diabetes mellitus type 2, controlled ([place])   Hyperthyroidism   Hyperkalemia   Acute renal failure superimposed on stage 3a chronic kidney disease (HCC)   Severe pulmonary hypertension (HCC)   Acute on chronic diastolic CHF (congestive heart failure) ([name])   #Chest Pain on 2/11: #NSTEMI: Troponin went up to 1,232 and 1,166.  Recent cath on 11/22/2020  showed 30% LAD disease and nonobstructive disease. Repeat 2D echo showed EF estimated at 55 to 60%, severe pulmonary hypertension, left moderate pleural effusion. Continue Plavix, Coreg and Lipitor Not a good candidate for cardiac cath because of severe pulmonary hypertension per cardiologist. Discontinued heparin drip 2/14 after 48 hours of treatment PT evaluation ordered and pending     #Acute on Chronic Hypoxic/Hypercapneic Respiratory Failure #Acute on Chronic Diastolic HF: #Severe Pulmonary HTN WHO Group II/III: #Bilateral Pleural Effusions: TTE on this admission with EF 60-65%, G1DD, severe pulmonary HTN with PASP 189mmHg. Now off milrinone and transitioned to sildenafil. V/Q scan negative for CTEPH. S/p right-sided thoracentesis on 02/11/2021 with removal of 600 cc of fluid. Ordered left-sided thoracentesis.  Oxygen requirement has been tapered down from 15 to 7 L/min via nasal cannula Continue sildenafil and torsemide Use BiPAP as needed and at night if she agrees     #HTN: Continue antihypertensives   #AKI on CKD 3A: Improved.  Continue torsemide   #History of cryptogenic stroke, nonobstructive CAD: -Continue plavix 75mg  daily -Continue lipitor 80mg  daily   #Chronic Anemia: -Stable; trend   DVT prophylaxis: Heparin drip to subcutaneous starting 2/14 Code Status: Full Family Communication: Tried calling daughter on phone with no answer 2/14 Disposition Plan:  Status is: Inpatient Remains inpatient appropriate because: Continues to require IV medications   Skin Assessment:  I have examined the patients skin and I agree with the wound assessment as performed by the wound care RN as outlined below:  Pressure Injury 12/14/20 [place] Right Stage 1 -  Intact skin with non-blanchable redness of a localized area usually over a bony prominence. Healing, pink (Active)  12/14/20  1500  Location: [facility]  Location Orientation: Right  Staging: Stage 1 -  Intact skin with  non-blanchable redness of a localized area usually over a bony prominence.  Wound Description (Comments): Healing, pink  Present on Admission: Yes    Consultants:  PCCM Cardiology  Procedures:  None  Antimicrobials:  Anti-infectives (From admission, onward)    None       Subjective: Patient seen and evaluated today with no new acute complaints or concerns. No acute concerns or events noted overnight.  She did require some BiPAP use overnight.  Objective: Vitals:   02/16/21 0900 02/16/21 1000 02/16/21 1100 02/16/21 1200  BP: (!) 111/39 135/64 121/60 132/62  Pulse:   92   Resp: 19 (!) 22 19 (!) 21  Temp:    98.6 F (37 C)  TempSrc:    Oral  SpO2:   94%   Weight:      Height:        Intake/Output Summary (Last 24 hours) at 02/16/2021 1258 Last data filed at 02/16/2021 1005 Gross per 24 hour  Intake 424.28 ml  Output 1100 ml  Net -675.72 ml   Filed Weights   02/13/21 0500 02/14/21 0500 02/15/21 0500  Weight: 69.3 kg 70.2 kg 70.8 kg    Examination:  General exam: Appears calm and comfortable  Respiratory system: Clear to auscultation. Respiratory effort normal.  Currently on 2 L nasal cannula oxygen Cardiovascular system: S1 & S2 heard, RRR.  Gastrointestinal system: Abdomen is soft Central nervous system: Alert and awake Extremities: No edema Skin: No significant lesions noted Psychiatry: Flat affect.    Data Reviewed: I have personally reviewed following labs and imaging studies  CBC: Recent Labs  Lab 02/10/21 0636 02/11/21 1111 02/14/21 0643 02/15/21 0013 02/16/21 0301  WBC 5.5 5.8 6.1 6.5 6.3  HGB 7.7* 8.1* 8.4* 8.5* 8.3*  HCT 25.1* 26.5* 27.3* 26.9* 26.9*  MCV 83.4 82.8 81.5 81.3 80.1  PLT 240 270 323 358 038   Basic Metabolic Panel: Recent Labs  Lab 02/10/21 0636 02/11/21 1111 02/12/21 0315 02/13/21 0257 02/14/21 0643 02/15/21 0013 02/16/21 0301  NA 133* 135 136 138 136 136 134*  K 3.8 2.9* 3.9 3.9 3.7 3.9 4.0  CL 81* 80* 83*  84* 85* 86* 86*  CO2 >45* >45* >45* >45* 43* 42* 40*  GLUCOSE 122* 189* 120* 111* 109* 124* 107*  BUN 69* 66* 53* 49* 35* 38* 40*  CREATININE 2.90* 2.00* 1.64* 1.79* 1.29* 1.29* 1.35*  CALCIUM 8.5* 8.5* 8.4* 8.5* 8.8* 9.0 8.8*  MG 2.8* 2.6*  --   --   --   --   --    GFR: Estimated Creatinine Clearance: 28.9 mL/min (A) (by C-G formula based on SCr of 1.35 mg/dL (H)). Liver Function Tests: Recent Labs  Lab 02/11/21 1627  ALBUMIN 3.2*   No results for input(s): LIPASE, AMYLASE in the last 168 hours. No results for input(s): AMMONIA in the last 168 hours. Coagulation Profile: No results for input(s): INR, PROTIME in the last 168 hours. Cardiac Enzymes: No results for input(s): CKTOTAL, CKMB, CKMBINDEX, TROPONINI in the last 168 hours. BNP (last 3 results) Recent Labs    01/14/21 1653  PROBNP 443.0*   HbA1C: No results for input(s): HGBA1C in the last 72 hours. CBG: Recent Labs  Lab 02/14/21 1937 02/15/21 0021  GLUCAP 151* 121*   Lipid Profile: No results for input(s): CHOL, HDL, LDLCALC, TRIG, CHOLHDL, LDLDIRECT in the last 72 hours. Thyroid Function Tests: No results  for input(s): TSH, T4TOTAL, FREET4, T3FREE, THYROIDAB in the last 72 hours. Anemia Panel: No results for input(s): VITAMINB12, FOLATE, FERRITIN, TIBC, IRON, RETICCTPCT in the last 72 hours. Sepsis Labs: No results for input(s): PROCALCITON, LATICACIDVEN in the last 168 hours.  Recent Results (from the past 240 hour(s))  MRSA Next Gen by PCR, Nasal     Status: None   Collection Time: 02/08/21 11:59 PM   Specimen: Nasal Mucosa; Nasal Swab  Result Value Ref Range Status   MRSA by PCR Next Gen NOT DETECTED NOT DETECTED Final    Comment: (NOTE) The GeneXpert MRSA Assay (FDA approved for NASAL specimens only), is one component of a comprehensive MRSA colonization surveillance program. It is not intended to diagnose MRSA infection nor to guide or monitor treatment for MRSA infections. Test performance is  not FDA approved in patients less than 51 years old. Performed at [facility], [place] [address], [place], [place] [address]   Body fluid culture w Gram Stain     Status: None   Collection Time: 02/11/21  4:24 PM   Specimen: Pleural Fluid  Result Value Ref Range Status   Specimen Description   Final    PLEURAL Performed at [place] [address], [place], [place] [address]    Special Requests   Final    NONE Performed at [facility], [place] [address], [place], [place] [address]    Gram Stain   Final    NO SQUAMOUS EPITHELIAL CELLS SEEN FEW WBC SEEN NO ORGANISMS SEEN    Culture   Final    NO GROWTH 3 DAYS Performed at [facility], [place] [address] [place], [place] [address]    Report Status 02/15/2021 FINAL  Final  Fungus Culture With Stain     Status: None (Preliminary result)   Collection Time: 02/11/21  4:25 PM   Specimen: Pleural Fluid  Result Value Ref Range Status   Fungus Stain Final report  Final    Comment: (NOTE) Performed At: [facility] [address] [place], [place] [id number] Rush Farmer MD KA:[id number]    Fungus (Mycology) Culture PENDING  Incomplete   Fungal Source PLEURAL  Final    Comment: Performed at [facility], [place] [address] [place], [place] [address]  Fungus Culture Result     Status: None   Collection Time: 02/11/21  4:25 PM  Result Value Ref Range Status   Result 1 Comment  Final    Comment: (NOTE) KOH/Calcofluor preparation:  no fungus observed. Performed At: [facility] [place], [place] [id number] Rush Farmer MD MI:[id number]          Radiology Studies: ECHOCARDIOGRAM LIMITED  Result Date: 02/14/2021    ECHOCARDIOGRAM LIMITED REPORT   Patient Name:   ANALEE MONTEE Date of Exam: 02/14/2021 Medical Rec #:  [id number]      Height:       59.0 in Accession #:    [id number]     Weight:       154.8 lb Date of  Birth:  February 02, 1940       BSA:          1.654 m Patient Age:    30 years       BP:           139/65 mmHg Patient Gender: F              HR:  86 bpm. Exam Location:  Inpatient Procedure: Limited Echo, 3D Echo, Cardiac Doppler and Color Doppler Indications:    122-I22.9 Subsequent ST elevation (STEM) and non-ST elevation                 (NSTEMI) myocardial infarction  History:        Patient has prior history of Echocardiogram examinations, most                 recent 02/05/2021. CHF, Pulmonary HTN and Stroke,                 Signs/Symptoms:Chest Pain, Dizziness/Lightheadedness, Shortness                 of Breath and Dyspnea; Risk Factors:Diabetes, Hypertension and                 Dyslipidemia.  Sonographer:    Roseanna Rainbow RDCS Referring Phys: [id number] Freada Bergeron  Sonographer Comments: Patient is morbidly obese. Image acquisition challenging due to patient body habitus. IMPRESSIONS  1. Left ventricular ejection fraction, by estimation, is 55 to 60%. Left ventricular ejection fraction by 3D volume is 55 %. The left ventricle has normal function. The left ventricle has no regional wall motion abnormalities. There is moderate concentric left ventricular hypertrophy.  2. Right ventricular systolic function was not well visualized. The right ventricular size is normal. There is severely elevated pulmonary artery systolic pressure. The estimated right ventricular systolic pressure is 99.8 mmHg.  3. The pericardial effusion is circumferential. Moderate pleural effusion in the left lateral region.  4. The mitral valve is normal in structure. Trivial mitral valve regurgitation. No evidence of mitral stenosis.  5. Tricuspid valve regurgitation is moderate.  6. The aortic valve is normal in structure. Aortic valve regurgitation is not visualized. No aortic stenosis is present.  7. The inferior vena cava is normal in size with greater than 50% respiratory variability, suggesting right atrial pressure of 3 mmHg.  8.  Compared to study dated 02/05/21,the IVC is no longer dilated and has normal respiratory collapse > 50%. TR is moderate and PASP has decreased from 172mmHg to 37mmHg. FINDINGS  Left Ventricle: Left ventricular ejection fraction, by estimation, is 55 to 60%. Left ventricular ejection fraction by 3D volume is 55 %. The left ventricle has normal function. The left ventricle has no regional wall motion abnormalities. The left ventricular internal cavity size was normal in size. There is moderate concentric left ventricular hypertrophy. Right Ventricle: The right ventricular size is normal. No increase in right ventricular wall thickness. Right ventricular systolic function was not well visualized. There is severely elevated pulmonary artery systolic pressure. The tricuspid regurgitant velocity is 3.82 m/s, and with an assumed right atrial pressure of 3 mmHg, the estimated right ventricular systolic pressure is 33.8 mmHg. Left Atrium: Left atrial size was normal in size. Right Atrium: Right atrial size was normal in size. Pericardium: Trivial pericardial effusion is present. The pericardial effusion is circumferential. Mitral Valve: The mitral valve is normal in structure. Trivial mitral valve regurgitation. No evidence of mitral valve stenosis. Tricuspid Valve: The tricuspid valve is normal in structure. Tricuspid valve regurgitation is moderate . No evidence of tricuspid stenosis. Aortic Valve: The aortic valve is normal in structure. Aortic valve regurgitation is not visualized. No aortic stenosis is present. Pulmonic Valve: The pulmonic valve was normal in structure. Pulmonic valve regurgitation is trivial. No evidence of pulmonic stenosis. Aorta: The aortic root is normal in size and structure. Venous:  The inferior vena cava is normal in size with greater than 50% respiratory variability, suggesting right atrial pressure of 3 mmHg. IAS/Shunts: No atrial level shunt detected by color flow Doppler. Additional Comments:  There is a moderate pleural effusion in the left lateral region. LEFT VENTRICLE PLAX 2D LVIDd:         4.10 cm LVIDs:         2.70 cm LV PW:         1.30 cm         3D Volume EF LV IVS:        1.50 cm         LV 3D EF:    Left                                             ventricul                                             ar LV Volumes (MOD)                            ejection LV vol d, MOD    67.2 ml                    fraction A2C:                                        by 3D LV vol d, MOD    62.7 ml                    volume is A4C:                                        55 %. LV vol s, MOD    32.2 ml A2C: LV vol s, MOD    25.6 ml       3D Volume EF: A4C:                           3D EF:        55 % LV SV MOD A2C:   35.0 ml       LV EDV:       98 ml LV SV MOD A4C:   62.7 ml       LV ESV:       45 ml LV SV MOD BP:    35.7 ml       LV SV:        54 ml IVC IVC diam: 1.70 cm LEFT ATRIUM         Index LA diam:    3.30 cm 2.00 cm/m                        PULMONIC VALVE AORTA                 PR End Diast Vel: 1.96 msec Ao Root diam:  3.30 cm Ao Asc diam:  3.50 cm TRICUSPID VALVE TR Peak grad:   58.4 mmHg TR Vmax:        382.00 cm/s Fransico Him MD Electronically signed by Fransico Him MD Signature Date/Time: 02/14/2021/2:59:34 PM    Final         Scheduled Meds:  arformoterol  15 mcg Nebulization BID   atorvastatin  80 mg Oral QPM   carvedilol  6.25 mg Oral BID WC   Chlorhexidine Gluconate Cloth  6 each Topical Daily   clopidogrel  75 mg Oral Daily   fluticasone  1 spray Each Nare Daily   gabapentin  100 mg Oral QHS   heparin injection (subcutaneous)  5,000 Units Subcutaneous Q8H   hydrALAZINE  25 mg Oral Q8H   mouth rinse  15 mL Mouth Rinse BID   melatonin  10 mg Oral QHS   methimazole  2.5 mg Oral Daily   pantoprazole  40 mg Oral q morning   revefenacin  175 mcg Nebulization Daily   sildenafil  20 mg Oral TID   sodium chloride flush  3 mL Intravenous Q12H   torsemide  40 mg Oral Daily      LOS: 13 days    Time spent: 35 minutes    Dhiya Smits Darleen Crocker, DO [facility]  If 7PM-7AM, please contact night-coverage [email or web address] 02/16/2021, 12:58 PM

## 2021-02-16 NOTE — Procedures (Signed)
Ultrasound-guided therapeutic right thoracentesis performed yielding 710 cc of  hazy,amber fluid. No immediate complications. Follow-up chest x-ray pending. EBL < 2 cc. Only minimal left pleural effusion noted.

## 2021-02-16 NOTE — Progress Notes (Signed)
° ° °  OVERNIGHT PROGRESS REPORT  Notified by RN for possible anxiety state of patient at time of family member imminent departure after visitation time.  Staff completed an EKG due to some increase in Heart Rate as prior. EKG appears to have no change.  Patient noted to have Valium for anxiety prior(home med) and acknowledges taking it at times. Single dose ordered for anxiety.  Gershon Cull MSNA MSN ACNPC-AG Acute Care Nurse Practitioner [place]

## 2021-02-16 NOTE — Progress Notes (Signed)
Progress Note  Patient Name: Mandy [name] Date of Encounter: 02/16/2021  Primary Cardiologist:   Donato Heinz, MD   Subjective   She denies pain or SOB.   Inpatient Medications    Scheduled Meds:  arformoterol  15 mcg Nebulization BID   atorvastatin  80 mg Oral QPM   carvedilol  6.25 mg Oral BID WC   Chlorhexidine Gluconate Cloth  6 each Topical Daily   clopidogrel  75 mg Oral Daily   fluticasone  1 spray Each Nare Daily   gabapentin  100 mg Oral QHS   hydrALAZINE  25 mg Oral Q8H   mouth rinse  15 mL Mouth Rinse BID   melatonin  10 mg Oral QHS   methimazole  2.5 mg Oral Daily   pantoprazole  40 mg Oral q morning   revefenacin  175 mcg Nebulization Daily   sildenafil  20 mg Oral TID   sodium chloride flush  3 mL Intravenous Q12H   torsemide  40 mg Oral Daily   Continuous Infusions:  heparin 900 Units/hr (02/16/21 0112)   PRN Meds: acetaminophen **OR** acetaminophen, albuterol, alum & mag hydroxide-simeth, docusate sodium, hydrALAZINE, morphine injection, ondansetron (ZOFRAN) IV, sodium chloride, traMADol   Vital Signs    Vitals:   02/16/21 0805 02/16/21 0900 02/16/21 1000 02/16/21 1100  BP:  (!) 111/39 135/64 121/60  Pulse:    92  Resp:  19 (!) 22 19  Temp:      TempSrc:      SpO2: 100%   94%  Weight:      Height:        Intake/Output Summary (Last 24 hours) at 02/16/2021 1205 Last data filed at 02/16/2021 1005 Gross per 24 hour  Intake 424.28 ml  Output 1100 ml  Net -675.72 ml   Filed Weights   02/13/21 0500 02/14/21 0500 02/15/21 0500  Weight: 69.3 kg 70.2 kg 70.8 kg    Telemetry    NSR - Personally Reviewed  ECG    NA - Personally Reviewed  Physical Exam   GEN: No acute distress.   Neck: No  JVD Cardiac: RRR, no murmurs, rubs, or gallops.  Respiratory:      Decreased breath sounds at the bases.   GI: Soft, nontender, non-distended  MS: No  edema; No deformity. Neuro:  Nonfocal  Psych: Normal affect.  Seems to be slightly  somnolent today.   Labs    Chemistry Recent Labs  Lab 02/11/21 1627 02/12/21 0315 02/14/21 0643 02/15/21 0013 02/16/21 0301  NA  --    < > 136 136 134*  K  --    < > 3.7 3.9 4.0  CL  --    < > 85* 86* 86*  CO2  --    < > 43* 42* 40*  GLUCOSE  --    < > 109* 124* 107*  BUN  --    < > 35* 38* 40*  CREATININE  --    < > 1.29* 1.29* 1.35*  CALCIUM  --    < > 8.8* 9.0 8.8*  ALBUMIN 3.2*  --   --   --   --   GFRNONAA  --    < > 42* 42* 40*  ANIONGAP  --    < > 8 8 8    < > = values in this interval not displayed.     Hematology Recent Labs  Lab 02/14/21 0643 02/15/21 0013 02/16/21 0301  WBC 6.1 6.5 6.3  RBC 3.35* 3.31* 3.36*  HGB 8.4* 8.5* 8.3*  HCT 27.3* 26.9* 26.9*  MCV 81.5 81.3 80.1  MCH 25.1* 25.7* 24.7*  MCHC 30.8 31.6 30.9  RDW 15.7* 15.7* 15.9*  PLT 323 358 325    Cardiac EnzymesNo results for input(s): TROPONINI in the last 168 hours. No results for input(s): TROPIPOC in the last 168 hours.   BNPNo results for input(s): BNP, PROBNP in the last 168 hours.   DDimer No results for input(s): DDIMER in the last 168 hours.   Radiology    ECHOCARDIOGRAM LIMITED  Result Date: 02/14/2021    ECHOCARDIOGRAM LIMITED REPORT   Patient Name:   CONNI KNIGHTON Date of Exam: 02/14/2021 Medical Rec #:  [id number]      Height:       59.0 in Accession #:    [id number]     Weight:       154.8 lb Date of Birth:  08/19/1940       BSA:          1.654 m Patient Age:    24 years       BP:           139/65 mmHg Patient Gender: F              HR:           86 bpm. Exam Location:  Inpatient Procedure: Limited Echo, 3D Echo, Cardiac Doppler and Color Doppler Indications:    122-I22.9 Subsequent ST elevation (STEM) and non-ST elevation                 (NSTEMI) myocardial infarction  History:        Patient has prior history of Echocardiogram examinations, most                 recent 02/05/2021. CHF, Pulmonary HTN and Stroke,                 Signs/Symptoms:Chest Pain, Dizziness/Lightheadedness,  Shortness                 of Breath and Dyspnea; Risk Factors:Diabetes, Hypertension and                 Dyslipidemia.  Sonographer:    Roseanna Rainbow RDCS Referring Phys: [id number] Freada Bergeron  Sonographer Comments: Patient is morbidly obese. Image acquisition challenging due to patient body habitus. IMPRESSIONS  1. Left ventricular ejection fraction, by estimation, is 55 to 60%. Left ventricular ejection fraction by 3D volume is 55 %. The left ventricle has normal function. The left ventricle has no regional wall motion abnormalities. There is moderate concentric left ventricular hypertrophy.  2. Right ventricular systolic function was not well visualized. The right ventricular size is normal. There is severely elevated pulmonary artery systolic pressure. The estimated right ventricular systolic pressure is 12.4 mmHg.  3. The pericardial effusion is circumferential. Moderate pleural effusion in the left lateral region.  4. The mitral valve is normal in structure. Trivial mitral valve regurgitation. No evidence of mitral stenosis.  5. Tricuspid valve regurgitation is moderate.  6. The aortic valve is normal in structure. Aortic valve regurgitation is not visualized. No aortic stenosis is present.  7. The inferior vena cava is normal in size with greater than 50% respiratory variability, suggesting right atrial pressure of 3 mmHg.  8. Compared to study dated 02/05/21,the IVC is no longer dilated and has normal respiratory collapse > 50%. TR is moderate and PASP has decreased from 143mmHg  to 48mmHg. FINDINGS  Left Ventricle: Left ventricular ejection fraction, by estimation, is 55 to 60%. Left ventricular ejection fraction by 3D volume is 55 %. The left ventricle has normal function. The left ventricle has no regional wall motion abnormalities. The left ventricular internal cavity size was normal in size. There is moderate concentric left ventricular hypertrophy. Right Ventricle: The right ventricular size is normal.  No increase in right ventricular wall thickness. Right ventricular systolic function was not well visualized. There is severely elevated pulmonary artery systolic pressure. The tricuspid regurgitant velocity is 3.82 m/s, and with an assumed right atrial pressure of 3 mmHg, the estimated right ventricular systolic pressure is 58.0 mmHg. Left Atrium: Left atrial size was normal in size. Right Atrium: Right atrial size was normal in size. Pericardium: Trivial pericardial effusion is present. The pericardial effusion is circumferential. Mitral Valve: The mitral valve is normal in structure. Trivial mitral valve regurgitation. No evidence of mitral valve stenosis. Tricuspid Valve: The tricuspid valve is normal in structure. Tricuspid valve regurgitation is moderate . No evidence of tricuspid stenosis. Aortic Valve: The aortic valve is normal in structure. Aortic valve regurgitation is not visualized. No aortic stenosis is present. Pulmonic Valve: The pulmonic valve was normal in structure. Pulmonic valve regurgitation is trivial. No evidence of pulmonic stenosis. Aorta: The aortic root is normal in size and structure. Venous: The inferior vena cava is normal in size with greater than 50% respiratory variability, suggesting right atrial pressure of 3 mmHg. IAS/Shunts: No atrial level shunt detected by color flow Doppler. Additional Comments: There is a moderate pleural effusion in the left lateral region. LEFT VENTRICLE PLAX 2D LVIDd:         4.10 cm LVIDs:         2.70 cm LV PW:         1.30 cm         3D Volume EF LV IVS:        1.50 cm         LV 3D EF:    Left                                             ventricul                                             ar LV Volumes (MOD)                            ejection LV vol d, MOD    67.2 ml                    fraction A2C:                                        by 3D LV vol d, MOD    62.7 ml                    volume is A4C:  55 %. LV  vol s, MOD    32.2 ml A2C: LV vol s, MOD    25.6 ml       3D Volume EF: A4C:                           3D EF:        55 % LV SV MOD A2C:   35.0 ml       LV EDV:       98 ml LV SV MOD A4C:   62.7 ml       LV ESV:       45 ml LV SV MOD BP:    35.7 ml       LV SV:        54 ml IVC IVC diam: 1.70 cm LEFT ATRIUM         Index LA diam:    3.30 cm 2.00 cm/m                        PULMONIC VALVE AORTA                 PR End Diast Vel: 1.96 msec Ao Root diam: 3.30 cm Ao Asc diam:  3.50 cm TRICUSPID VALVE TR Peak grad:   58.4 mmHg TR Vmax:        382.00 cm/s Fransico Him MD Electronically signed by Fransico Him MD Signature Date/Time: 02/14/2021/2:59:34 PM    Final     Cardiac Studies   Echo:    1. Left ventricular ejection fraction, by estimation, is 55 to 60%. Left  ventricular ejection fraction by 3D volume is 55 %. The left ventricle has  normal function. The left ventricle has no regional wall motion  abnormalities. There is moderate  concentric left ventricular hypertrophy.   2. Right ventricular systolic function was not well visualized. The right  ventricular size is normal. There is severely elevated pulmonary artery  systolic pressure. The estimated right ventricular systolic pressure is  81.1 mmHg.   3. The pericardial effusion is circumferential. Moderate pleural effusion  in the left lateral region.   4. The mitral valve is normal in structure. Trivial mitral valve  regurgitation. No evidence of mitral stenosis.   5. Tricuspid valve regurgitation is moderate.   6. The aortic valve is normal in structure. Aortic valve regurgitation is  not visualized. No aortic stenosis is present.   7. The inferior vena cava is normal in size with greater than 50%  respiratory variability, suggesting right atrial pressure of 3 mmHg.   8. Compared to study dated 02/05/21,the IVC is no longer dilated and has  normal respiratory collapse > 50%. TR is moderate and PASP has decreased  from 149mmHg to 38mmHg.      Patient Profile     81 y.o. 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 Has hx of prior Covid PNA with hypoxemia in 02/2019 as well as 06/2020. 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 Between 09/2020 and now has had multiple readmissions for recurrent a/c respiratory failure and pleural effusions. Admitted with same 02/03/21, transported from sleep center with hypoxia  Assessment &  Plan    NSTEMI:   No plan for repeat invasive evaluation.   Echo with no change in LV function. Continue beta blocker and Plavix.     ACUTE RESPIRATORY FAILURE:  Severe pulmonary HTN.  Continue sildenafil.   Net negative 8 liters this admission.  Continue current torsemide.     HTN:  Her BPs have been labile . I will not increase the beta blocker.    I will see as needed.     For questions or updates, please contact [facility] Please consult [email or web address] for contact info under Cardiology/STEMI.   Signed, Minus Breeding, MD  02/16/2021, 12:05 PM

## 2021-02-16 NOTE — Progress Notes (Signed)
Notified Elink provider of increase in heart rate and chest pain rated 5/10. EKG performed, morphine given per order, requested nitro. Will continue to assess.

## 2021-02-16 NOTE — Progress Notes (Signed)
ANTICOAGULATION CONSULT NOTE   Pharmacy Consult for heparin Indication: chest pain/ACS  No Known Allergies  Patient Measurements: Height: 4\' 11"  (149.9 cm) Weight: 70.8 kg (156 lb 1.4 oz) IBW/kg (Calculated) : 43.2 Heparin Dosing Weight: 69 kg  Vital Signs: Temp: 98.5 F (36.9 C) (02/14 0337) Temp Source: Axillary (02/14 0337) BP: 145/64 (02/14 0700) Pulse Rate: 95 (02/13 2329)  Labs: Recent Labs     0000 02/13/21 1522 02/13/21 1721 02/14/21 0643 02/14/21 1651 02/15/21 0013 02/15/21 1020 02/15/21 2046 02/16/21 0301  HGB   < >  --   --  8.4*  --  8.5*  --   --  8.3*  HCT  --   --   --  27.3*  --  26.9*  --   --  26.9*  PLT  --   --   --  323  --  358  --   --  325  HEPARINUNFRC  --   --   --  >1.10*   < > 0.22* 0.25* 0.48 0.49  CREATININE  --   --   --  1.29*  --  1.29*  --   --  1.35*  TROPONINIHS  --  1,232* 1,166*  --   --   --   --   --   --    < > = values in this interval not displayed.     Estimated Creatinine Clearance: 28.9 mL/min (A) (by C-G formula based on SCr of 1.35 mg/dL (H)).   Assessment: Patient's a 81 y.o F with hx HF, pulmonary HTN, chronic hypoxic and hypercarbic respiratory failure, CKD presented to the ED on 02/02/21 fwith c/o SOB. She developed CP on 02/13/21 with elevated troponin. Pharmacy has been consulted to dose heparin for ACS.  02/16/2021 PM HL therapeutic on heparin 900 units/hr Hgb low but stable, plts stable WNL SCr down ~25% since starting heparin Per RN no bleeding or infusion issues Continues on PTA Plavix  Goal of Therapy:  Heparin level 0.3-0.7 units/ml Monitor platelets by anticoagulation protocol: Yes   Plan:  Continue heparin infusion at 900 units/hr - per Cards, plan is for medical management with heparin x 48 hr Daily CBC and heparin level Monitor for s/sx bleeding  Reuel Boom, PharmD, BCPS [phone] 02/16/2021, 7:31 AM

## 2021-02-17 ENCOUNTER — Inpatient Hospital Stay (HOSPITAL_COMMUNITY): Payer: Medicare HMO

## 2021-02-17 DIAGNOSIS — J9622 Acute and chronic respiratory failure with hypercapnia: Secondary | ICD-10-CM | POA: Diagnosis not present

## 2021-02-17 DIAGNOSIS — J9621 Acute and chronic respiratory failure with hypoxia: Secondary | ICD-10-CM | POA: Diagnosis not present

## 2021-02-17 LAB — COOXEMETRY PANEL
Carboxyhemoglobin: <2.2 % — ABNORMAL HIGH (ref 0.5–1.5)
Methemoglobin: 0.7 % (ref 0.0–1.5)
O2 Saturation: 47.1 %
Total hemoglobin: 9.4 g/dL — ABNORMAL LOW (ref 12.0–16.0)

## 2021-02-17 LAB — CBC
HCT: 26.2 % — ABNORMAL LOW (ref 36.0–46.0)
Hemoglobin: 8.3 g/dL — ABNORMAL LOW (ref 12.0–15.0)
MCH: 25.2 pg — ABNORMAL LOW (ref 26.0–34.0)
MCHC: 31.7 g/dL (ref 30.0–36.0)
MCV: 79.4 fL — ABNORMAL LOW (ref 80.0–100.0)
Platelets: 370 10*3/uL (ref 150–400)
RBC: 3.3 MIL/uL — ABNORMAL LOW (ref 3.87–5.11)
RDW: 15.8 % — ABNORMAL HIGH (ref 11.5–15.5)
WBC: 6.9 10*3/uL (ref 4.0–10.5)
nRBC: 0 % (ref 0.0–0.2)

## 2021-02-17 LAB — BASIC METABOLIC PANEL
Anion gap: 7 (ref 5–15)
Anion gap: 8 (ref 5–15)
BUN: 46 mg/dL — ABNORMAL HIGH (ref 8–23)
BUN: 45 mg/dL — ABNORMAL HIGH (ref 8–23)
CO2: 38 mmol/L — ABNORMAL HIGH (ref 22–32)
CO2: 38 mmol/L — ABNORMAL HIGH (ref 22–32)
Calcium: 8.7 mg/dL — ABNORMAL LOW (ref 8.9–10.3)
Calcium: 8.6 mg/dL — ABNORMAL LOW (ref 8.9–10.3)
Chloride: 87 mmol/L — ABNORMAL LOW (ref 98–111)
Chloride: 87 mmol/L — ABNORMAL LOW (ref 98–111)
Creatinine, Ser: 1.56 mg/dL — ABNORMAL HIGH (ref 0.44–1.00)
Creatinine, Ser: 1.45 mg/dL — ABNORMAL HIGH (ref 0.44–1.00)
GFR, Estimated: 34 mL/min — ABNORMAL LOW (ref >60)
GFR, Estimated: 37 mL/min — ABNORMAL LOW (ref >60)
Glucose, Bld: 140 mg/dL — ABNORMAL HIGH (ref 70–99)
Glucose, Bld: 135 mg/dL — ABNORMAL HIGH (ref 70–99)
Potassium: 3.9 mmol/L (ref 3.5–5.1)
Potassium: 3.8 mmol/L (ref 3.5–5.1)
Sodium: 132 mmol/L — ABNORMAL LOW (ref 135–145)
Sodium: 133 mmol/L — ABNORMAL LOW (ref 135–145)

## 2021-02-17 LAB — MAGNESIUM: Magnesium: 1.9 mg/dL (ref 1.7–2.4)

## 2021-02-17 LAB — TROPONIN I (HIGH SENSITIVITY): Troponin I (High Sensitivity): 535 ng/L (ref <18)

## 2021-02-17 MED ORDER — CARVEDILOL 12.5 MG PO TABS
12.5000 mg | ORAL_TABLET | Freq: Two times a day (BID) | ORAL | Status: DC
  Administered 2021-02-17 – 2021-02-22 (×11): 12.5 mg via ORAL
  Filled 2021-02-17 (×11): qty 1

## 2021-02-17 NOTE — Progress Notes (Signed)
PROGRESS NOTE  Secily Walthour  STM:[id number] DOB: April 23, 1940 DOA: 02/02/2021 PCP: [name], MD   Brief Narrative: Patient is a 81 year old female with history of severe pulmonary hypertension, hyperlipidemia, diabetes type 2, chronic pleural effusion, goiter, hypothyroidism, chronic diastolic CHF, chronic hypoxic/hypercarbic respiratory failure on 2 L of oxygen at home who presented to the emergency department on 02/03/2021 from sleep center after her saturation dropped to 30 percent for 10 minutes.  Patient also reporting shortness of breath for last 2 to 3 weeks before admission.  On presentation, chest x-ray showed cardiomegaly with moderate pulmonary edema, moderate right-sided pleural effusion, stable small left-sided pleural effusion.  Patient was admitted for the management of acute on chronic hypoxic/hypercapnic respiratory failure secondary to CHF exacerbation, pulm hypertension.  Cardiology was also consulted after admission, started on IV diuresis.  Hospital course remarkable for worsening dyspnea not responding to BiPAP and she had to be transferred to ICU for close observation.  Her overall condition has improved.  Cardiology have signed off.  PT/OT recommending home health.  Discharge planning in next 2-3 days.  High risk for readmission.  Important events:   2/1 admit to hospitalists 2/2 PCCM consult for hypercarbia 2/3 Patient had loss of consciousness with O2 desaturation with movement 2/5 milrinone dose titrated down, tolerated well 2/6 milrinone off, started sildenafil 2/9 thoracentesis 600cc, probably pseudoexudate in setting diuresis (exudative by protein only), cyto pending 2/11 she complained of chest pain and troponins were elevated to 1,232 and 1,166.  She was started on IV heparin drip 2/14 heparin drip discontinued after 48 hours of conservative management per cardiology recommendations.  Plans for left-sided thoracentesis today.  PT evaluation ordered and  pending Assessment & Plan:  Principal Problem:   Acute on chronic respiratory failure with hypoxia and hypercapnia (HCC) Active Problems:   Diabetes mellitus type 2, controlled ([place])   Hyperthyroidism   Hyperkalemia   Acute renal failure superimposed on stage 3a chronic kidney disease (HCC)   Severe pulmonary hypertension (HCC)   Acute on chronic diastolic CHF (congestive heart failure) (HCC)   Acute on chronic hypoxic/hypercarbic respiratory failure: Uses 2 L of oxygen at home.  Chronic hypoxia secondary to CHF, severe pulmonary hypertension, pleural effusion. She presented with desaturation from sleep study center.  She was complaining of dyspnea.  Cardiology was consulted on admission. Echocardiogram done on this admission showed EF of 60 to 89% grade 1 diastolic dysfunction, severe pulmonary hypertension with pulmonary artery systolic pressure of 211 mmHg.  Was on milrinone, transition to sildenafil now.  VQ scan negative for chronic thromboembolic pulmonary hypertension. He has BiPAP as needed at night.  Currently on sildenafil, torsemide. Had a discussion with PCCM today, Dr. Valeta Harms.  PCCM will arrange follow-up as an outpatient, she is supposed to follow-up with Dr. Silas Flood for her pulmonary hypertension.  No options for discharging her with a CPAP or BiPAP because insurance will not cover.  She needs to get finish  the sleep study for CPAP. Continue torsemide  Pleural effusion/small loculated hydropneumothorax: Underwent right  thoracentesis multiple times, last one  with removal of 710 cc of fluid.  Resulting small loculated right hydropneumothorax.  IR aware.  Currently respiratory status stable.  Continue conservative management  Chest pain/elevated troponin: Recent cath on 11/20 had shown 30% LAD disease, nonobstructive pathology.  No further work-up planned by cardiology.  Currently denies any chest pain.  On Plavix, Coreg, Lipitor.  Not a candidate for cath because of severe  pulmonary hypertension.  She was initially  started on heparin drip now discontinued.  Hypertension: Currently blood pressure stable.  Continue current medications  AKI on CKD stage IIIa: Currently kidney function at baseline.  Her baseline creatinine fluctuates from 1-1.4.    History of cryptogenic stroke: On Plavix, Lipitor  Hyponatremia: Mild, most likely secondary to some component of volume overload.  Continue torsemide  Chronic anemia: Her baseline hemoglobin ranges from 9-10.  Hemoglobin has trended down to 8.  No report of hematochezia or melena.  Continue monitoring  Goals of care: Palliative care was following her here.  She has multiple hospitalization.  She remains full code.  Debility/deconditioning: Prolonged hospitalization.  PT has recommended home health.  Plan to discharge in next 2-3 days if she remains stable.  Will transfer out of ICU. She has high risk of readmission be due to her significant lung and cardiac problems       DVT prophylaxis:heparin injection 5,000 Units Start: 02/16/21 1400     Code Status: Full Code  Family Communication: Long discussion held with the daughter on phone on 2/15  Patient status:Inpatient  Patient is from :Home  Anticipated discharge OF:[facility]  Estimated DC date: Next 2 to 3 days   Consultants: Cardiology, palliative care, PCCM  Procedures: Thoracentesis  Antimicrobials:  Anti-infectives (From admission, onward)    None       Subjective: Patient seen and examined at the bedside this morning.  Overall she looks comfortable, hemodynamically stable.  She was on 2 L of oxygen per minute.  She was not in any pain or respiratory distress.  But remains weak, deconditioned  Objective: Vitals:   02/17/21 0603 02/17/21 0800 02/17/21 0805 02/17/21 0840  BP:  (!) 106/33    Pulse:      Resp:  (!) 21    Temp:   98.5 F (36.9 C)   TempSrc:   Oral   SpO2: 98%   100%  Weight:      Height:        Intake/Output Summary  (Last 24 hours) at 02/17/2021 1017 Last data filed at 02/17/2021 0600 Gross per 24 hour  Intake --  Output 725 ml  Net -725 ml   Filed Weights   02/14/21 0500 02/15/21 0500 02/17/21 0500  Weight: 70.2 kg 70.8 kg 70 kg    Examination:  General exam: Not in distress, chronically looking, obese HEENT: PERRL Respiratory system: Bilateral crackles, diminished air sounds Cardiovascular system: S1 & S2 heard, RRR.  Gastrointestinal system: Abdomen is nondistended, soft and nontender. Central nervous system: Alert and oriented Extremities: No edema, no clubbing ,no cyanosis Skin: No rashes, no ulcers,no icterus     Data Reviewed: I have personally reviewed following labs and imaging studies  CBC: Recent Labs  Lab 02/11/21 1111 02/14/21 0643 02/15/21 0013 02/16/21 0301 02/17/21 0257  WBC 5.8 6.1 6.5 6.3 6.9  HGB 8.1* 8.4* 8.5* 8.3* 8.3*  HCT 26.5* 27.3* 26.9* 26.9* 26.2*  MCV 82.8 81.5 81.3 80.1 79.4*  PLT 270 323 358 325 258   Basic Metabolic Panel: Recent Labs  Lab 02/11/21 1111 02/12/21 0315 02/14/21 0643 02/15/21 0013 02/16/21 0301 02/16/21 2327 02/17/21 0257  NA 135   < > 136 136 134* 132* 133*  K 2.9*   < > 3.7 3.9 4.0 3.9 3.8  CL 80*   < > 85* 86* 86* 87* 87*  CO2 >45*   < > 43* 42* 40* 38* 38*  GLUCOSE 189*   < > 109* 124* 107* 140* 135*  BUN  66*   < > 35* 38* 40* 46* 45*  CREATININE 2.00*   < > 1.29* 1.29* 1.35* 1.56* 1.45*  CALCIUM 8.5*   < > 8.8* 9.0 8.8* 8.7* 8.6*  MG 2.6*  --   --   --   --  1.9  --    < > = values in this interval not displayed.     Recent Results (from the past 240 hour(s))  MRSA Next Gen by PCR, Nasal     Status: None   Collection Time: 02/08/21 11:59 PM   Specimen: Nasal Mucosa; Nasal Swab  Result Value Ref Range Status   MRSA by PCR Next Gen NOT DETECTED NOT DETECTED Final    Comment: (NOTE) The GeneXpert MRSA Assay (FDA approved for NASAL specimens only), is one component of a comprehensive MRSA colonization  surveillance program. It is not intended to diagnose MRSA infection nor to guide or monitor treatment for MRSA infections. Test performance is not FDA approved in patients less than 70 years old. Performed at [facility], [place] [address], [place], [place] [address]   Body fluid culture w Gram Stain     Status: None   Collection Time: 02/11/21  4:24 PM   Specimen: Pleural Fluid  Result Value Ref Range Status   Specimen Description   Final    PLEURAL Performed at [facility] [address] [place], [place] [address]    Special Requests   Final    NONE Performed at [facility], [place] [address], [place], [place] [address]    Gram Stain   Final    NO SQUAMOUS EPITHELIAL CELLS SEEN FEW WBC SEEN NO ORGANISMS SEEN    Culture   Final    NO GROWTH 3 DAYS Performed at [facility], [place] [address] [place], [place] [address]    Report Status 02/15/2021 FINAL  Final  Fungus Culture With Stain     Status: None (Preliminary result)   Collection Time: 02/11/21  4:25 PM   Specimen: Pleural Fluid  Result Value Ref Range Status   Fungus Stain Final report  Final    Comment: (NOTE) Performed At: [facility] [address] [place], [place] [id number] Rush Farmer MD YI:[id number]    Fungus (Mycology) Culture PENDING  Incomplete   Fungal Source PLEURAL  Final    Comment: Performed at [facility], [place] [address] [place], [place] [address]  Fungus Culture Result     Status: None   Collection Time: 02/11/21  4:25 PM  Result Value Ref Range Status   Result 1 Comment  Final    Comment: (NOTE) KOH/Calcofluor preparation:  no fungus observed. Performed At: [facility] [place], [place] [id number] Rush Farmer MD EZ:[id number]      Radiology Studies: DG Chest 1 View  Result Date: 02/16/2021 CLINICAL DATA:  Status post thoracentesis EXAM: CHEST  1 VIEW COMPARISON:   02/14/2011 FINDINGS: Numerous leads and wires project over the chest. Midline trachea. Cardiomegaly accentuated by AP portable technique. Atherosclerosis in the transverse aorta. Small inferolateral right-sided pneumothorax, with visceral pleural line on the order of 11 mm from the chest wall. Trace apical component as well. Decreased small right pleural effusion. Persistent small left pleural effusion. Mild interstitial edema. Improved right and persistent left base airspace disease. IMPRESSION: Small right-sided pneumothorax. Decreased right-sided pleural fluid and adjacent atelectasis. Persistent small left pleural effusion with adjacent atelectasis. Cardiomegaly with mild interstitial edema. These  results will be called to the ordering clinician or representative by the Radiologist Assistant, and communication documented in the PACS or [employer]. Electronically Signed   By: Abigail Miyamoto M.D.   On: 02/16/2021 16:49   DG Chest Port 1 View  Result Date: 02/17/2021 CLINICAL DATA:  Right-sided pneumothorax. EXAM: PORTABLE CHEST 1 VIEW COMPARISON:  02/16/2021 and CT chest 12/14/2020. FINDINGS: Trachea is midline. Heart is enlarged. Mixed interstitial and airspace opacification bilaterally, worsened since 02/16/2021. Small amount of loculated air at the lateral base of the right hemithorax appears similar. Bilateral pleural effusions. IMPRESSION: 1. Small loculated right hydropneumothorax. 2. Congestive heart failure. Electronically Signed   By: Lorin Picket M.D.   On: 02/17/2021 08:36   DG Chest Port 1 View  Result Date: 02/16/2021 CLINICAL DATA:  Right-sided pneumothorax. EXAM: PORTABLE CHEST 1 VIEW COMPARISON:  Chest radiograph dated 02/16/2021. FINDINGS: Slight interval increase in the right basilar pneumothorax since the earlier radiograph. There is cardiomegaly with vascular congestion and edema. Small bilateral pleural effusions. Left lung base atelectasis or infiltrate. Atherosclerotic  calcification of the aorta. Degenerative changes of spine. No acute osseous pathology. IMPRESSION: 1. Slight interval increase in the right basilar pneumothorax since the earlier radiograph. 2. Cardiomegaly with vascular congestion and edema. 3. Small bilateral pleural effusions. Electronically Signed   By: Anner Crete M.D.   On: 02/16/2021 21:45   US THORACENTESIS ASP PLEURAL SPACE W/IMG GUIDE  Result Date: 02/16/2021 INDICATION: Patient with history of pulmonary hypertension, CHF, non STEMI, coronary artery disease, chronic kidney disease, recurrent pleural effusions. Request received for therapeutic right thoracentesis. EXAM: ULTRASOUND GUIDED THERAPEUTIC RIGHT THORACENTESIS MEDICATIONS: 10 mL 1% lidocaine COMPLICATIONS: None immediate. PROCEDURE: An ultrasound guided thoracentesis was thoroughly discussed with the patient/daughter and questions answered. The benefits, risks, alternatives and complications were also discussed. The patient understands and wishes to proceed with the procedure. Written consent was obtained. Ultrasound was performed to localize and mark an adequate pocket of fluid in the right chest. The area was then prepped and draped in the normal sterile fashion. 1% Lidocaine was used for local anesthesia. Under ultrasound guidance a 6 Fr Safe-T-Centesis catheter was introduced. Thoracentesis was performed. The catheter was removed and a dressing applied. FINDINGS: A total of approximately 710 cc of hazy, amber fluid was removed. IMPRESSION: Successful ultrasound guided therapeutic right thoracentesis yielding 710 cc of pleural fluid. Read by: Rowe Robert, PA-C Electronically Signed   By: Lucrezia Europe M.D.   On: 02/16/2021 16:51    Scheduled Meds:  arformoterol  15 mcg Nebulization BID   atorvastatin  80 mg Oral QPM   carvedilol  6.25 mg Oral BID WC   Chlorhexidine Gluconate Cloth  6 each Topical Daily   clopidogrel  75 mg Oral Daily   fluticasone  1 spray Each Nare Daily    gabapentin  100 mg Oral QHS   heparin injection (subcutaneous)  5,000 Units Subcutaneous Q8H   hydrALAZINE  25 mg Oral Q8H   mouth rinse  15 mL Mouth Rinse BID   melatonin  10 mg Oral QHS   methimazole  2.5 mg Oral Daily   pantoprazole  40 mg Oral q morning   revefenacin  175 mcg Nebulization Daily   sildenafil  20 mg Oral TID   sodium chloride flush  3 mL Intravenous Q12H   torsemide  40 mg Oral Daily   Continuous Infusions:   LOS: 14 days   Shelly Coss, MD [facility] P2/15/2023, 10:17 AM

## 2021-02-17 NOTE — Progress Notes (Signed)
Occupational Therapy Treatment Patient Details Name: Mandy [name] MRN: [id number] DOB: 1940-03-26 Today's Date: 02/17/2021   History of present illness 81 yo female admitted with acute on chronic resp failure, acute on chronic HF, bil pleural effusion. Hx of anemia, severe pulm HTN, DM, HF, CVA, CAD, CKD. Pt is non English speaking-family interprets-hospital interpreter not available   OT comments  Patient needing min A to upright trunk to sitting position at edge of bed. With use of visual cues patient agreeable to transfer to recliner chair. Min G assist with use of rolling walker for safety, did not need physical assist to power up to standing. Set up for hygiene seated in chair. Progressing well compared to initial OT evaluation with mobility. Per MD likely to D/C home with family support in few days, will continue to follow acutely.    Recommendations for follow up therapy are one component of a multi-disciplinary discharge planning process, led by the attending physician.  Recommendations may be updated based on patient status, additional functional criteria and insurance authorization.    Follow Up Recommendations  No OT follow up    Assistance Recommended at Discharge Frequent or constant Supervision/Assistance  Patient can return home with the following  A little help with walking and/or transfers;A little help with bathing/dressing/bathroom   Equipment Recommendations  BSC/3in1       Precautions / Restrictions Precautions Precautions: Fall Restrictions Weight Bearing Restrictions: No       Mobility Bed Mobility Overal bed mobility: Needs Assistance Bed Mobility: Supine to Sit     Supine to sit: Min assist, HOB elevated     General bed mobility comments: min A to upright trunk       Balance Overall balance assessment: Needs assistance Sitting-balance support: Feet unsupported Sitting balance-Leahy Scale: Fair     Standing balance support: Reliant on  assistive device for balance Standing balance-Leahy Scale: Poor                             ADL either performed or assessed with clinical judgement   ADL Overall ADL's : Needs assistance/impaired     Grooming: Wash/dry hands;Wash/dry face;Set up;Sitting                   Toilet Transfer: Min guard;Stand-pivot;Rolling walker (2 wheels) Toilet Transfer Details (indicate cue type and reason): Min G for safety no physical assist to power up to standing from edge of bed         Functional mobility during ADLs: Min guard;Rolling walker (2 wheels)        Cognition Arousal/Alertness: Awake/alert Behavior During Therapy: Flat affect Overall Cognitive Status: Difficult to assess                                 General Comments: follows directions well with visual cues              General Comments VSS 2L    Pertinent Vitals/ Pain       Pain Assessment Pain Assessment: No/denies pain         Frequency  Min 2X/week        Progress Toward Goals  OT Goals(current goals can now be found in the care plan section)  Progress towards OT goals: Progressing toward goals  Acute Rehab OT Goals Patient Stated Goal: agreeable to sit in chair OT Goal Formulation: With  patient Time For Goal Achievement: 02/24/21 Potential to Achieve Goals: Good ADL Goals Pt Will Transfer to Toilet: with supervision;stand pivot transfer;bedside commode Additional ADL Goal #1: Patient will tolerate 10 minutes out of bed activity in order to participate in self care tasks.  Plan Discharge plan remains appropriate       AM-PAC OT "6 Clicks" Daily Activity     Outcome Measure   Help from another person eating meals?: A Little Help from another person taking care of personal grooming?: A Little Help from another person toileting, which includes using toliet, bedpan, or urinal?: A Lot Help from another person bathing (including washing, rinsing, drying)?: A  Little Help from another person to put on and taking off regular upper body clothing?: A Little Help from another person to put on and taking off regular lower body clothing?: A Lot 6 Click Score: 16    End of Session Equipment Utilized During Treatment: Oxygen;Rolling walker (2 wheels)  OT Visit Diagnosis: Unsteadiness on feet (R26.81);Other abnormalities of gait and mobility (R26.89);Muscle weakness (generalized) (M62.81)   Activity Tolerance Patient tolerated treatment well   Patient Left in chair;with call bell/phone within reach;with chair alarm set;with nursing/sitter in room   Nurse Communication Mobility status        Time: 7340-3709 OT Time Calculation (min): 14 min  Charges: OT General Charges $OT Visit: 1 Visit OT Treatments $Self Care/Home Management : 8-22 mins  Delbert Phenix OT OT pager: [phone]   Rosemary Holms 02/17/2021, 1:04 PM

## 2021-02-17 NOTE — Progress Notes (Signed)
Progress Note  Patient Name: Mandy [name] Date of Encounter: 02/17/2021  Primary Cardiologist:   Donato Heinz, MD   Subjective   She does not report SOB.  She reporting new chest pain although she seems slightly confused. .  EKG done yesterday evening because of increased HR.  However, she had no significant change on the EKG and she was very anxious at the time.    Inpatient Medications    Scheduled Meds:  arformoterol  15 mcg Nebulization BID   atorvastatin  80 mg Oral QPM   carvedilol  6.25 mg Oral BID WC   Chlorhexidine Gluconate Cloth  6 each Topical Daily   clopidogrel  75 mg Oral Daily   fluticasone  1 spray Each Nare Daily   gabapentin  100 mg Oral QHS   heparin injection (subcutaneous)  5,000 Units Subcutaneous Q8H   hydrALAZINE  25 mg Oral Q8H   mouth rinse  15 mL Mouth Rinse BID   melatonin  10 mg Oral QHS   methimazole  2.5 mg Oral Daily   pantoprazole  40 mg Oral q morning   revefenacin  175 mcg Nebulization Daily   sildenafil  20 mg Oral TID   sodium chloride flush  3 mL Intravenous Q12H   torsemide  40 mg Oral Daily   Continuous Infusions:   PRN Meds: acetaminophen **OR** acetaminophen, albuterol, alum & mag hydroxide-simeth, docusate sodium, hydrALAZINE, morphine injection, ondansetron (ZOFRAN) IV, sodium chloride, traMADol   Vital Signs    Vitals:   02/17/21 0603 02/17/21 0800 02/17/21 0805 02/17/21 0840  BP:  (!) 106/33    Pulse:      Resp:  (!) 21    Temp:   98.5 F (36.9 C)   TempSrc:   Oral   SpO2: 98%   100%  Weight:      Height:        Intake/Output Summary (Last 24 hours) at 02/17/2021 1208 Last data filed at 02/17/2021 0600 Gross per 24 hour  Intake --  Output 725 ml  Net -725 ml   Filed Weights   02/14/21 0500 02/15/21 0500 02/17/21 0500  Weight: 70.2 kg 70.8 kg 70 kg    Telemetry    NSR - Personally Reviewed  ECG    NA - Personally Reviewed  Physical Exam   GEN: No  acute distress.   Neck: No   JVD Cardiac: RRR, no murmurs, rubs, or gallops.  Respiratory:    Decreased breath sounds right greater than left base GI: Soft, nontender, non-distended, normal bowel sounds  MS:  No edema; No deformity. Neuro:   Nonfocal  Psych: Oriented and appropriate    Labs    Chemistry Recent Labs  Lab 02/11/21 1627 02/12/21 0315 02/16/21 0301 02/16/21 2327 02/17/21 0257  NA  --    < > 134* 132* 133*  K  --    < > 4.0 3.9 3.8  CL  --    < > 86* 87* 87*  CO2  --    < > 40* 38* 38*  GLUCOSE  --    < > 107* 140* 135*  BUN  --    < > 40* 46* 45*  CREATININE  --    < > 1.35* 1.56* 1.45*  CALCIUM  --    < > 8.8* 8.7* 8.6*  ALBUMIN 3.2*  --   --   --   --   GFRNONAA  --    < > 40* 34*  25*  ANIONGAP  --    < > 8 7 8    < > = values in this interval not displayed.     Hematology Recent Labs  Lab 02/15/21 0013 02/16/21 0301 02/17/21 0257  WBC 6.5 6.3 6.9  RBC 3.31* 3.36* 3.30*  HGB 8.5* 8.3* 8.3*  HCT 26.9* 26.9* 26.2*  MCV 81.3 80.1 79.4*  MCH 25.7* 24.7* 25.2*  MCHC 31.6 30.9 31.7  RDW 15.7* 15.9* 15.8*  PLT 358 325 370    Cardiac EnzymesNo results for input(s): TROPONINI in the last 168 hours. No results for input(s): TROPIPOC in the last 168 hours.   BNPNo results for input(s): BNP, PROBNP in the last 168 hours.   DDimer No results for input(s): DDIMER in the last 168 hours.   Radiology    DG Chest 1 View  Result Date: 02/16/2021 CLINICAL DATA:  Status post thoracentesis EXAM: CHEST  1 VIEW COMPARISON:  02/14/2011 FINDINGS: Numerous leads and wires project over the chest. Midline trachea. Cardiomegaly accentuated by AP portable technique. Atherosclerosis in the transverse aorta. Small inferolateral right-sided pneumothorax, with visceral pleural line on the order of 11 mm from the chest wall. Trace apical component as well. Decreased small right pleural effusion. Persistent small left pleural effusion. Mild interstitial edema. Improved right and persistent left base airspace  disease. IMPRESSION: Small right-sided pneumothorax. Decreased right-sided pleural fluid and adjacent atelectasis. Persistent small left pleural effusion with adjacent atelectasis. Cardiomegaly with mild interstitial edema. These results will be called to the ordering clinician or representative by the Radiologist Assistant, and communication documented in the PACS or [employer]. Electronically Signed   By: Abigail Miyamoto M.D.   On: 02/16/2021 16:49   DG Chest Port 1 View  Result Date: 02/17/2021 CLINICAL DATA:  Right-sided pneumothorax. EXAM: PORTABLE CHEST 1 VIEW COMPARISON:  02/16/2021 and CT chest 12/14/2020. FINDINGS: Trachea is midline. Heart is enlarged. Mixed interstitial and airspace opacification bilaterally, worsened since 02/16/2021. Small amount of loculated air at the lateral base of the right hemithorax appears similar. Bilateral pleural effusions. IMPRESSION: 1. Small loculated right hydropneumothorax. 2. Congestive heart failure. Electronically Signed   By: Lorin Picket M.D.   On: 02/17/2021 08:36   DG Chest Port 1 View  Result Date: 02/16/2021 CLINICAL DATA:  Right-sided pneumothorax. EXAM: PORTABLE CHEST 1 VIEW COMPARISON:  Chest radiograph dated 02/16/2021. FINDINGS: Slight interval increase in the right basilar pneumothorax since the earlier radiograph. There is cardiomegaly with vascular congestion and edema. Small bilateral pleural effusions. Left lung base atelectasis or infiltrate. Atherosclerotic calcification of the aorta. Degenerative changes of spine. No acute osseous pathology. IMPRESSION: 1. Slight interval increase in the right basilar pneumothorax since the earlier radiograph. 2. Cardiomegaly with vascular congestion and edema. 3. Small bilateral pleural effusions. Electronically Signed   By: Anner Crete M.D.   On: 02/16/2021 21:45   US THORACENTESIS ASP PLEURAL SPACE W/IMG GUIDE  Result Date: 02/16/2021 INDICATION: Patient with history of pulmonary  hypertension, CHF, non STEMI, coronary artery disease, chronic kidney disease, recurrent pleural effusions. Request received for therapeutic right thoracentesis. EXAM: ULTRASOUND GUIDED THERAPEUTIC RIGHT THORACENTESIS MEDICATIONS: 10 mL 1% lidocaine COMPLICATIONS: None immediate. PROCEDURE: An ultrasound guided thoracentesis was thoroughly discussed with the patient/daughter and questions answered. The benefits, risks, alternatives and complications were also discussed. The patient understands and wishes to proceed with the procedure. Written consent was obtained. Ultrasound was performed to localize and mark an adequate pocket of fluid in the right chest. The area was then prepped and  draped in the normal sterile fashion. 1% Lidocaine was used for local anesthesia. Under ultrasound guidance a 6 Fr Safe-T-Centesis catheter was introduced. Thoracentesis was performed. The catheter was removed and a dressing applied. FINDINGS: A total of approximately 710 cc of hazy, amber fluid was removed. IMPRESSION: Successful ultrasound guided therapeutic right thoracentesis yielding 710 cc of pleural fluid. Read by: Rowe Robert, PA-C Electronically Signed   By: Lucrezia Europe M.D.   On: 02/16/2021 16:51    Cardiac Studies   Echo:    1. Left ventricular ejection fraction, by estimation, is 55 to 60%. Left  ventricular ejection fraction by 3D volume is 55 %. The left ventricle has  normal function. The left ventricle has no regional wall motion  abnormalities. There is moderate  concentric left ventricular hypertrophy.   2. Right ventricular systolic function was not well visualized. The right  ventricular size is normal. There is severely elevated pulmonary artery  systolic pressure. The estimated right ventricular systolic pressure is  40.8 mmHg.   3. The pericardial effusion is circumferential. Moderate pleural effusion  in the left lateral region.   4. The mitral valve is normal in structure. Trivial mitral valve   regurgitation. No evidence of mitral stenosis.   5. Tricuspid valve regurgitation is moderate.   6. The aortic valve is normal in structure. Aortic valve regurgitation is  not visualized. No aortic stenosis is present.   7. The inferior vena cava is normal in size with greater than 50%  respiratory variability, suggesting right atrial pressure of 3 mmHg.   8. Compared to study dated 02/05/21,the IVC is no longer dilated and has  normal respiratory collapse > 50%. TR is moderate and PASP has decreased  from 188mmHg to 61mmHg.     Patient Profile     80 y.o. 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 Has hx of prior Covid PNA with hypoxemia in 02/2019 as well as 06/2020. 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 Between 09/2020 and now has had multiple readmissions for recurrent a/c respiratory failure and pleural effusions. Admitted with same 02/03/21, transported from sleep center with hypoxia  Assessment & Plan    NSTEMI:   No plan for repeat invasive evaluation.   Echo with no change in LV function. Continue current therapy.     ACUTE RESPIRATORY FAILURE:  Severe pulmonary HTN.  Continue sildenafil.   Net negative 8 liters this admission.  Continue current torsemide.     HTN:  Her BPs have been labile . I will not increase the beta blocker.    TACHYCARDIA:  Telemetry reviewed.  No arrhythmias.  I will increase the beta blocker since her BP will tolerate this.       For questions or updates, please contact [place] Please consult [email or web address] for contact info under Cardiology/STEMI.   Signed, Minus Breeding, MD  02/17/2021, 12:08 PM

## 2021-02-17 NOTE — Progress Notes (Signed)
Pt once again refusing to wear BIPAP QHS.  Pt currently on 3 LPM [place] and tolerating well. RN aware.

## 2021-02-17 NOTE — TOC Progression Note (Signed)
Transition of Care [facility]) - Progression Note    Patient Details  Name: Mandy [name] MRN: [id number] Date of Birth: October 27, 1940  [facility] CM/SW Contact  Sanaii Caporaso, Juliann Pulse, RN Phone Number: 02/17/2021, 9:24 AM  Clinical Narrative:   Damaris Schooner to dtr [name] about recc SNF-she declines SNF-wants home w/HHC/personal care services. Lenn Cal already following for HHRN/PT/OT/aide/CSW;[facility] private duty rep Verdene Lennert aware of referral for custodial level care(they will eval once at home)[facility] home 02 already-dtr has travel tank to bring to hospital @ d/c;Contacted [employer] rep Jermaine to pick up his home 02 tanks from patient's home.[name] would like a hospital bed-will inform [facility], & MD. Continue to monitor for d/c plans.          Expected Discharge Plan: [place] Barriers to Discharge: Continued Medical Work up  Expected Discharge Plan and Services Expected Discharge Plan: [place]   Discharge Planning Services: CM Consult Post Acute Care Choice: NA Living arrangements for the past 2 months: Single Family Home                                       Social Determinants of Health (SDOH) Interventions    Readmission Risk Interventions Readmission Risk Prevention Plan 02/05/2021 11/06/2020  Transportation Screening Complete Complete  PCP or Specialist Appt within 3-5 Days - Complete  HRI or [place] - Complete  Social Work Consult for [place] Planning/Counseling - Complete  Palliative Care Screening - Complete  Medication Review Press photographer) Complete Complete  PCP or Specialist appointment within 3-5 days of discharge Complete -  [place] or Home Care Consult Complete -  SW Recovery Care/Counseling Consult Complete -  Palliative Care Screening Complete -  [place] Not Applicable -  Some recent data might be hidden

## 2021-02-17 NOTE — Progress Notes (Signed)
In reviewing CXR with Dr. Maryelizabeth Kaufmann, there has been no change in small, right-sided pneumothorax since 02/16/21. Recommendation for repeat CXR 02/18/21.  IR to continue to follow.  Please contact IR with questions or concerns.     Narda Rutherford, AGNP-BC 02/17/2021, 12:17 PM

## 2021-02-18 ENCOUNTER — Inpatient Hospital Stay (HOSPITAL_COMMUNITY): Payer: Medicare HMO

## 2021-02-18 DIAGNOSIS — J9622 Acute and chronic respiratory failure with hypercapnia: Secondary | ICD-10-CM | POA: Diagnosis not present

## 2021-02-18 DIAGNOSIS — J9621 Acute and chronic respiratory failure with hypoxia: Secondary | ICD-10-CM | POA: Diagnosis not present

## 2021-02-18 LAB — CBC WITH DIFFERENTIAL/PLATELET
Abs Immature Granulocytes: 0.04 10*3/uL (ref 0.00–0.07)
Basophils Absolute: 0 10*3/uL (ref 0.0–0.1)
Basophils Relative: 0 %
Eosinophils Absolute: 0.1 10*3/uL (ref 0.0–0.5)
Eosinophils Relative: 2 %
HCT: 23.2 % — ABNORMAL LOW (ref 36.0–46.0)
Hemoglobin: 7.3 g/dL — ABNORMAL LOW (ref 12.0–15.0)
Immature Granulocytes: 1 %
Lymphocytes Relative: 18 %
Lymphs Abs: 1.2 10*3/uL (ref 0.7–4.0)
MCH: 24.8 pg — ABNORMAL LOW (ref 26.0–34.0)
MCHC: 31.5 g/dL (ref 30.0–36.0)
MCV: 78.9 fL — ABNORMAL LOW (ref 80.0–100.0)
Monocytes Absolute: 0.8 10*3/uL (ref 0.1–1.0)
Monocytes Relative: 12 %
Neutro Abs: 4.3 10*3/uL (ref 1.7–7.7)
Neutrophils Relative %: 67 %
Platelets: 352 10*3/uL (ref 150–400)
RBC: 2.94 MIL/uL — ABNORMAL LOW (ref 3.87–5.11)
RDW: 15.9 % — ABNORMAL HIGH (ref 11.5–15.5)
WBC: 6.5 10*3/uL (ref 4.0–10.5)
nRBC: 0 % (ref 0.0–0.2)

## 2021-02-18 LAB — BASIC METABOLIC PANEL
Anion gap: 8 (ref 5–15)
BUN: 49 mg/dL — ABNORMAL HIGH (ref 8–23)
CO2: 38 mmol/L — ABNORMAL HIGH (ref 22–32)
Calcium: 8.5 mg/dL — ABNORMAL LOW (ref 8.9–10.3)
Chloride: 89 mmol/L — ABNORMAL LOW (ref 98–111)
Creatinine, Ser: 1.68 mg/dL — ABNORMAL HIGH (ref 0.44–1.00)
GFR, Estimated: 31 mL/min — ABNORMAL LOW (ref >60)
Glucose, Bld: 167 mg/dL — ABNORMAL HIGH (ref 70–99)
Potassium: 3.7 mmol/L (ref 3.5–5.1)
Sodium: 135 mmol/L (ref 135–145)

## 2021-02-18 LAB — GLUCOSE, CAPILLARY
Glucose-Capillary: 148 mg/dL — ABNORMAL HIGH (ref 70–99)
Glucose-Capillary: 230 mg/dL — ABNORMAL HIGH (ref 70–99)

## 2021-02-18 LAB — HEMOGLOBIN AND HEMATOCRIT, BLOOD
HCT: 26.4 % — ABNORMAL LOW (ref 36.0–46.0)
Hemoglobin: 8.3 g/dL — ABNORMAL LOW (ref 12.0–15.0)

## 2021-02-18 NOTE — Progress Notes (Addendum)
Progress Note  Patient Name: Mandy [name] Date of Encounter: 02/18/2021  [facility] Cardiologist: Donato Heinz, MD  Subjective   No acute overnight events. Patient taking breathing treatment when I walked in the room. She states her breathing is better than yesterday. No chest pain. She is on 2.5 L of O2 via  nasal cannula. She has been refusing to wear BiPAP at night.  Inpatient Medications    Scheduled Meds:  arformoterol  15 mcg Nebulization BID   atorvastatin  80 mg Oral QPM   carvedilol  12.5 mg Oral BID WC   Chlorhexidine Gluconate Cloth  6 each Topical Daily   clopidogrel  75 mg Oral Daily   fluticasone  1 spray Each Nare Daily   gabapentin  100 mg Oral QHS   heparin injection (subcutaneous)  5,000 Units Subcutaneous Q8H   hydrALAZINE  25 mg Oral Q8H   mouth rinse  15 mL Mouth Rinse BID   melatonin  10 mg Oral QHS   methimazole  2.5 mg Oral Daily   pantoprazole  40 mg Oral q morning   revefenacin  175 mcg Nebulization Daily   sildenafil  20 mg Oral TID   sodium chloride flush  3 mL Intravenous Q12H   torsemide  40 mg Oral Daily   Continuous Infusions:  PRN Meds: acetaminophen **OR** acetaminophen, albuterol, alum & mag hydroxide-simeth, docusate sodium, hydrALAZINE, morphine injection, ondansetron (ZOFRAN) IV, sodium chloride, traMADol   Vital Signs    Vitals:   02/17/21 2045 02/18/21 0000 02/18/21 0400 02/18/21 0500  BP:  (!) 123/40 (!) 137/56   Pulse:      Resp:  19 (!) 21   Temp:  99.9 F (37.7 C)    TempSrc:  Oral    SpO2: 98% 98%    Weight:    70.4 kg  Height:        Intake/Output Summary (Last 24 hours) at 02/18/2021 0729 Last data filed at 02/17/2021 2300 Gross per 24 hour  Intake 720 ml  Output 500 ml  Net 220 ml   Last 3 Weights 02/18/2021 02/17/2021 02/15/2021  Weight (lbs) 155 lb 3.3 oz 154 lb 5.2 oz 156 lb 1.4 oz  Weight (kg) 70.4 kg 70 kg 70.8 kg      Telemetry    Normal sinus rhythm with rates in the 80s to 90s. -  Personally Reviewed  ECG    No new ECG tracing today. - Personally Reviewed  Physical Exam   GEN: No acute distress.   Neck: No JVD. Cardiac: RRR. No murmurs, rubs, or gallops.  Respiratory: No increased work of breathing. Decreased breath sounds in bilateral bases with faint crackles noted. GI: Soft, non-distended, and non-tender. MS: No lower extremity edema. No deformity. Skin: Warm and dry. Neuro:  No focal deficits. Psych: Normal affect.  Labs    High Sensitivity Troponin:   Recent Labs  Lab 02/06/21 0854 02/06/21 1300 02/13/21 1522 02/13/21 1721 02/16/21 2327  TROPONINIHS 92* 85* 1,232* 1,166* 535*     Chemistry Recent Labs  Lab 02/11/21 1111 02/11/21 1627 02/12/21 0315 02/16/21 2327 02/17/21 0257 02/18/21 0253  NA 135  --    < > 132* 133* 135  K 2.9*  --    < > 3.9 3.8 3.7  CL 80*  --    < > 87* 87* 89*  CO2 >45*  --    < > 38* 38* 38*  GLUCOSE 189*  --    < > 140*  135* 167*  BUN 66*  --    < > 46* 45* 49*  CREATININE 2.00*  --    < > 1.56* 1.45* 1.68*  CALCIUM 8.5*  --    < > 8.7* 8.6* 8.5*  MG 2.6*  --   --  1.9  --   --   ALBUMIN  --  3.2*  --   --   --   --   GFRNONAA 25*  --    < > 34* 37* 31*  ANIONGAP NOT CALCULATED  --    < > 7 8 8    < > = values in this interval not displayed.    Lipids No results for input(s): CHOL, TRIG, HDL, LABVLDL, LDLCALC, CHOLHDL in the last 168 hours.  Hematology Recent Labs  Lab 02/16/21 0301 02/17/21 0257 02/18/21 0253  WBC 6.3 6.9 6.5  RBC 3.36* 3.30* 2.94*  HGB 8.3* 8.3* 7.3*  HCT 26.9* 26.2* 23.2*  MCV 80.1 79.4* 78.9*  MCH 24.7* 25.2* 24.8*  MCHC 30.9 31.7 31.5  RDW 15.9* 15.8* 15.9*  PLT 325 370 352   Thyroid No results for input(s): TSH, FREET4 in the last 168 hours.  BNPNo results for input(s): BNP, PROBNP in the last 168 hours.  DDimer No results for input(s): DDIMER in the last 168 hours.   Radiology    DG Chest 1 View  Result Date: 02/16/2021 CLINICAL DATA:  Status post thoracentesis  EXAM: CHEST  1 VIEW COMPARISON:  02/14/2011 FINDINGS: Numerous leads and wires project over the chest. Midline trachea. Cardiomegaly accentuated by AP portable technique. Atherosclerosis in the transverse aorta. Small inferolateral right-sided pneumothorax, with visceral pleural line on the order of 11 mm from the chest wall. Trace apical component as well. Decreased small right pleural effusion. Persistent small left pleural effusion. Mild interstitial edema. Improved right and persistent left base airspace disease. IMPRESSION: Small right-sided pneumothorax. Decreased right-sided pleural fluid and adjacent atelectasis. Persistent small left pleural effusion with adjacent atelectasis. Cardiomegaly with mild interstitial edema. These results will be called to the ordering clinician or representative by the Radiologist Assistant, and communication documented in the PACS or [employer]. Electronically Signed   By: Abigail Miyamoto M.D.   On: 02/16/2021 16:49   DG Chest Port 1 View  Result Date: 02/17/2021 CLINICAL DATA:  Right-sided pneumothorax. EXAM: PORTABLE CHEST 1 VIEW COMPARISON:  02/16/2021 and CT chest 12/14/2020. FINDINGS: Trachea is midline. Heart is enlarged. Mixed interstitial and airspace opacification bilaterally, worsened since 02/16/2021. Small amount of loculated air at the lateral base of the right hemithorax appears similar. Bilateral pleural effusions. IMPRESSION: 1. Small loculated right hydropneumothorax. 2. Congestive heart failure. Electronically Signed   By: Lorin Picket M.D.   On: 02/17/2021 08:36   DG Chest Port 1 View  Result Date: 02/16/2021 CLINICAL DATA:  Right-sided pneumothorax. EXAM: PORTABLE CHEST 1 VIEW COMPARISON:  Chest radiograph dated 02/16/2021. FINDINGS: Slight interval increase in the right basilar pneumothorax since the earlier radiograph. There is cardiomegaly with vascular congestion and edema. Small bilateral pleural effusions. Left lung base atelectasis or  infiltrate. Atherosclerotic calcification of the aorta. Degenerative changes of spine. No acute osseous pathology. IMPRESSION: 1. Slight interval increase in the right basilar pneumothorax since the earlier radiograph. 2. Cardiomegaly with vascular congestion and edema. 3. Small bilateral pleural effusions. Electronically Signed   By: Anner Crete M.D.   On: 02/16/2021 21:45   US THORACENTESIS ASP PLEURAL SPACE W/IMG GUIDE  Result Date: 02/16/2021 INDICATION: Patient with history of  pulmonary hypertension, CHF, non STEMI, coronary artery disease, chronic kidney disease, recurrent pleural effusions. Request received for therapeutic right thoracentesis. EXAM: ULTRASOUND GUIDED THERAPEUTIC RIGHT THORACENTESIS MEDICATIONS: 10 mL 1% lidocaine COMPLICATIONS: None immediate. PROCEDURE: An ultrasound guided thoracentesis was thoroughly discussed with the patient/daughter and questions answered. The benefits, risks, alternatives and complications were also discussed. The patient understands and wishes to proceed with the procedure. Written consent was obtained. Ultrasound was performed to localize and mark an adequate pocket of fluid in the right chest. The area was then prepped and draped in the normal sterile fashion. 1% Lidocaine was used for local anesthesia. Under ultrasound guidance a 6 Fr Safe-T-Centesis catheter was introduced. Thoracentesis was performed. The catheter was removed and a dressing applied. FINDINGS: A total of approximately 710 cc of hazy, amber fluid was removed. IMPRESSION: Successful ultrasound guided therapeutic right thoracentesis yielding 710 cc of pleural fluid. Read by: Rowe Robert, PA-C Electronically Signed   By: Lucrezia Europe M.D.   On: 02/16/2021 16:51    Cardiac Studies   Left Cardiac Catheterization 11/24/2020:   Mid LAD lesion is 30% stenosed.   Dist LAD lesion is 30% stenosed.   LV end diastolic pressure is low.   LV end diastolic pressure is normal.   The left  ventricular ejection fraction is 55-65% by visual estimate.   Hemodynamic findings consistent with moderate pulmonary hypertension.   1.  Mild obstructive coronary artery disease. 2.  Preserved cardiac output of 6.1 L/min and an index of 3.55 L/min/m. 3.  Mean pulmonary artery pressure of 39 mmHg with pulmonary vascular resistance of 3.44 Woods units; transpulmonary gradient of 77mmHg (mean wedge pressure 31mmHg) 4.  LVEDP of 9 mmHg with preserved LVEF.   Recommendation: Medical therapy.   Diagnostic Dominance: Right _______________   Echocardiogram 02/05/2021: Impressions:  1. Severe pulmonary hypertension estimated PA systolic 916 mmHg.   2. Left ventricular ejection fraction, by estimation, is 60 to 65%. The  left ventricle has normal function. The left ventricle has no regional  wall motion abnormalities. There is severe left ventricular hypertrophy.  Left ventricular diastolic parameters   are consistent with Grade I diastolic dysfunction (impaired relaxation).   3. Right ventricular systolic function is moderately reduced. The right  ventricular size is moderately enlarged. There is severely elevated  pulmonary artery systolic pressure.   4. The pericardial effusion is posterior to the left ventricle.   5. The mitral valve is abnormal. Trivial mitral valve regurgitation. No  evidence of mitral stenosis.   6. Tricuspid valve regurgitation is severe.   7. The aortic valve is tricuspid. There is mild calcification of the  aortic valve. Aortic valve regurgitation is not visualized. Aortic valve  sclerosis is present, with no evidence of aortic valve stenosis.   8. The inferior vena cava is dilated in size with <50% respiratory  variability, suggesting right atrial pressure of 15 mmHg.  Patient Profile     81 y.o. 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  Assessment & Plan    Acute on Chronic Hypoxic/Hypercapneic Respiratory Failure Acute on Chronic Diastolic HF: Severe Pulmonary HTN: Recurrent Bilateral Pleural Effusions: Patient presented after profound hypoxic event with SpO2 in 30s during sleep for about 66min. Chest x-ray notable for bilateral pleural effusions. BNP 670. Underwent  thoracentesis with removal of 600 cc of fluid on 2/9 and was placed on inotropic support to aide in diuresis with improvement. Underwent right sided thoracentesis with removal of 710 cc of fluid on 2/14.  TTE showed EF 60-65%, G1DD, severe pulmonary HTN with PASP 134mmHg. Now off Milrinone and transitioned to Sildenafil. She initially required IV Lasix drip and then was transitioned to PO Torsemide on 2/11. Net negative 8 L this admission. Weight down 9 lbs from admission. Creatinine has been fluctuating the last few days but is slightly up from 1.45 yesterday to 1.68 today. - Appears euvolemic on exam.  - Continue Torsemide 40mg  daily. If creatinine continues to rise, may need to hold this. - Continue Sildenafil 20mg  three times daily. - BiPAP management per CCM. Patient has been refusing this. - Overall, poor prognosis given severe pulmonary hypertension but wishes to continue with full treatment as able per discussions with palliative.    Chest Pain Non-Obstructive CAD Non-obstructive CAD noted on cath in 11/2020. High-sensitivity  troponin was initially minimally elevated and flat peaking at 92 on admission. However, she had recurrent chest pain on morning of 2/11 - EKG showed inferior T wave inversions and T wave flattening unchanged from prior tracings and repeat high-sensitivity troponin peaked at 1,232.  - Patient denies any chest pain today. - On Plavix given history of stroke. - Continue Lipitor 80mg  daily. - Not felt to be a good cath candidate due to very poor prognosis in the setting of severe pulmonary hypertension.    HTN: BP has been labile this admission but mostly well controlled now.  - Continue Coreg 12.5mg  twice daily. - Continue Hydralazine 25mg  three times daily.   AKI on CKD Stage 3A Creatinine 1.62 on admission and peaked at 3.7 on 2/7. Creatinine has been fluctuating the last few days: 1.29 >> 1.25 >> 1.56 >> 1.45 >> 1.68 today. Baseline around 1.2 to 1.5.  - Continue to monitor closely.   History of Cryptogenic Stroke - Continue Plavix and Lipitor.   Chronic Anemia Baseline hemoglobin around 7.5 to 9.5. - Hemoglobin 7.3 today.  - Management per primary team.    For questions or updates, please contact [place] Please consult [email or web address] for contact info under        Signed, Darreld Mclean, PA-C  02/18/2021, 7:29 AM    History and all data above reviewed.  Patient examined.  I agree with the findings as above.   She denies chest pain or sob. The patient exam reveals COR: RRR  ,  Lungs: Decreased breath sounds with diffuse crackles  ,  Abd: Positive bowel sounds, no rebound no guarding, Ext Mild edema  .  All available labs, radiology testing, previous records reviewed. Agree with documented assessment and plan.  BP is controlled.  NO acute complaints.  Please call with further questions. Jeneen Rinks Marten Iles  11:31 AM  02/18/2021

## 2021-02-18 NOTE — Progress Notes (Signed)
PROGRESS NOTE  Mandy [name]  VXB:[id number] DOB: 1940/04/26 DOA: 02/02/2021 PCP: [name], MD   Brief Narrative: This 81 year old female with PMH significant for severe pulmonary hypertension, hyperlipidemia, diabetes type 2, chronic pleural effusion, goiter, hypothyroidism, chronic diastolic CHF, chronic hypoxic/hypercarbic respiratory failure on 2 L of oxygen at home who presented to the emergency department on 02/03/2021 from sleep center after her saturation dropped to 30% for 10 minutes.  Patient also reporting shortness of breath for last 2 to 3 weeks before admission.  On presentation, chest x-ray showed cardiomegaly with moderate pulmonary edema, moderate right-sided pleural effusion, stable small left-sided pleural effusion.  Patient was admitted for the management of acute on chronic hypoxic/hypercapnic respiratory failure secondary to CHF exacerbation, pulm hypertension.  Cardiology was also consulted after admission, started on IV diuresis.  Hospital course remarkable for worsening dyspnea not responding to BiPAP and she had to be transferred to ICU for close observation.  Her overall condition has improved.  Cardiology have signed off.  PT/OT recommending home health.  Discharge planning in next 2-3 days.  High risk for readmission.  Important events:   2/1 admit to hospitalists 2/2 PCCM consult for hypercarbia 2/3 Patient had loss of consciousness with O2 desaturation with movement 2/5 milrinone dose titrated down, tolerated well 2/6 milrinone off, started sildenafil 2/9 thoracentesis 600cc, probably pseudoexudate in setting diuresis (exudative by protein only), cyto pending 2/11 she complained of chest pain and troponins were elevated to 1,232 and 1,166.  She was started on IV heparin drip 2/14 heparin drip discontinued after 48 hours of conservative management per cardiology recommendations. She underwent left-sided thoracentesis .  PT evaluation ordered and  pending.  Assessment & Plan:  Principal Problem:   Acute on chronic respiratory failure with hypoxia and hypercapnia (HCC) Active Problems:   Diabetes mellitus type 2, controlled ([place])   Hyperthyroidism   Hyperkalemia   Acute renal failure superimposed on stage 3a chronic kidney disease (HCC)   Severe pulmonary hypertension (HCC)   Acute on chronic diastolic CHF (congestive heart failure) (HCC)  Acute on chronic hypoxic and hypercapnic respiratory failure. She uses 2 L of oxygen at home at baseline.   She does have chronic hypoxia secondary to CHF, severe pulmonary hypertension, pleural effusion. She presented with desaturation from sleep study center.  She was complaining of dyspnea.   Cardiology was consulted on admission.Echo showed LVEF 60 to 33%, grade 1 diastolic dysfunction, severe pulmonary hypertension with pulmonary artery systolic pressure of 007 mmHg.   She was on milrinone, transitioned to sildenafil now.   VQ scan negative for pulmonary embolism. Continue BiPAP at night.  Continue sildenafil, torsemide. PCCM will arrange follow-up as an outpatient, She is supposed to follow-up with Dr. Silas Flood for her pulmonary hypertension.  No options for discharging her with a CPAP or BiPAP because insurance will not cover.   She needs to finish the sleep study for CPAP. Continue torsemide for diuresis.  Pleural effusion /small loculated hydropneumothorax: She underwent right thoracentesis multiple times, last one  2/14 with removal of 710 cc of fluid, resulting in small loculated right hydropneumothorax.  IR aware.  Currently respiratory status stable.  Continue conservative management.  Elevated Troponin: Recent LHC on 11/20 had shown 30% LAD disease, nonobstructive pathology.   No further work-up planned by cardiology.  Currently denies any chest pain.   Continue  Plavix, Coreg, Lipitor.  Not a candidate for cath because of severe pulmonary hypertension.   She was initially  started on heparin drip now  discontinued after 48 hrs.  Essential Hypertension: Currently blood pressure stable.  Continue current medications.  AKI on CKD stage IIIa : Currently kidney function at baseline.  Her baseline creatinine fluctuates from 1-1.4.    History of cryptogenic stroke.  Continue Plavix, Lipitor  Hyponatremia, mild: Likely secondary to some component of volume overload.  Continue torsemide.  Chronic anemia : Her baseline Hb ranges from 9-10.  Hemoglobin has trended down to 7.3.   No report of hematochezia or melena.  Continue monitoring  Goals of care: She has multiple hospitalization.  She remains full code.  Continue palliative care consult.  Deconditioning : Prolonged hospitalization.  PT has recommended home health.   Plan to discharge in next 2-3 days if she remains stable.   She has high risk of readmission be due to her significant lung and cardiac problems      DVT prophylaxis: Heparin     Code Status: Full Code  Family Communication: Spoke with daughter on the phone.  Patient status:Inpatient  Patient is from :Home  Anticipated discharge HA:LPFX  Estimated DC date: Next 1 to 2 days.   Consultants: Cardiology, palliative care, PCCM  Procedures: Thoracentesis  Antimicrobials:  Anti-infectives (From admission, onward)    None       Subjective: Patient was seen and examined at bedside.  Overnight events noted.  She looks comfortable.  She is on 2 L of oxygen at baseline.  She does not appear in respiratory distress.  Weak and deconditioned  Objective: Vitals:   02/18/21 0813 02/18/21 0909 02/18/21 1200 02/18/21 1203  BP:  (!) 124/49 (!) 131/52   Pulse:  95 87   Resp:   (!) 25   Temp: 98.7 F (37.1 C)   98.1 F (36.7 C)  TempSrc: Axillary   Oral  SpO2: 100%  95%   Weight:      Height:        Intake/Output Summary (Last 24 hours) at 02/18/2021 1327 Last data filed at 02/18/2021 1200 Gross per 24 hour  Intake 1446 ml   Output 800 ml  Net 646 ml   Filed Weights   02/15/21 0500 02/17/21 0500 02/18/21 0500  Weight: 70.8 kg 70 kg 70.4 kg    Examination:  General exam: Appears comfortable, not in any acute distress.  Deconditioned HEENT: PERRL Respiratory system: Decreased breath sounds, clear to auscultation, no accessory muscle use. Cardiovascular system: S1-S2 heard, regular rate and rhythm, no murmur. Gastrointestinal system: Abdomen is soft, nontender, nondistended, BS+ Central nervous system: Alert and oriented X 2 Extremities: No edema, no clubbing ,no cyanosis Skin: No rashes, no ulcers,no icterus     Data Reviewed: I have personally reviewed following labs and imaging studies  CBC: Recent Labs  Lab 02/14/21 0643 02/15/21 0013 02/16/21 0301 02/17/21 0257 02/18/21 0253  WBC 6.1 6.5 6.3 6.9 6.5  NEUTROABS  --   --   --   --  4.3  HGB 8.4* 8.5* 8.3* 8.3* 7.3*  HCT 27.3* 26.9* 26.9* 26.2* 23.2*  MCV 81.5 81.3 80.1 79.4* 78.9*  PLT 323 358 325 370 902   Basic Metabolic Panel: Recent Labs  Lab 02/15/21 0013 02/16/21 0301 02/16/21 2327 02/17/21 0257 02/18/21 0253  NA 136 134* 132* 133* 135  K 3.9 4.0 3.9 3.8 3.7  CL 86* 86* 87* 87* 89*  CO2 42* 40* 38* 38* 38*  GLUCOSE 124* 107* 140* 135* 167*  BUN 38* 40* 46* 45* 49*  CREATININE 1.29* 1.35* 1.56* 1.45* 1.68*  CALCIUM 9.0 8.8* 8.7* 8.6* 8.5*  MG  --   --  1.9  --   --      Recent Results (from the past 240 hour(s))  MRSA Next Gen by PCR, Nasal     Status: None   Collection Time: 02/08/21 11:59 PM   Specimen: Nasal Mucosa; Nasal Swab  Result Value Ref Range Status   MRSA by PCR Next Gen NOT DETECTED NOT DETECTED Final    Comment: (NOTE) The GeneXpert MRSA Assay (FDA approved for NASAL specimens only), is one component of a comprehensive MRSA colonization surveillance program. It is not intended to diagnose MRSA infection nor to guide or monitor treatment for MRSA infections. Test performance is not FDA approved in  patients less than 47 years old. Performed at [facility], [place] [address] [place], [place] [address]   Body fluid culture w Gram Stain     Status: None   Collection Time: 02/11/21  4:24 PM   Specimen: Pleural Fluid  Result Value Ref Range Status   Specimen Description   Final    PLEURAL Performed at [place] [address] [place], [place] [address]    Special Requests   Final    NONE Performed at [facility], [place] [address], [place], [place] [address]    Gram Stain   Final    NO SQUAMOUS EPITHELIAL CELLS SEEN FEW WBC SEEN NO ORGANISMS SEEN    Culture   Final    NO GROWTH 3 DAYS Performed at [facility], [place] [address], [place], [place] [address]    Report Status 02/15/2021 FINAL  Final  Fungus Culture With Stain     Status: None (Preliminary result)   Collection Time: 02/11/21  4:25 PM   Specimen: Pleural Fluid  Result Value Ref Range Status   Fungus Stain Final report  Final    Comment: (NOTE) Performed At: [facility] [address] [place], [place] [id number] Rush Farmer MD CZ:[id number]    Fungus (Mycology) Culture PENDING  Incomplete   Fungal Source PLEURAL  Final    Comment: Performed at [facility], [place] [address] [place], [place] [address]  Fungus Culture Result     Status: None   Collection Time: 02/11/21  4:25 PM  Result Value Ref Range Status   Result 1 Comment  Final    Comment: (NOTE) KOH/Calcofluor preparation:  no fungus observed. Performed At: [facility] [place], [place] [id number] Rush Farmer MD [place]:[id number]      Radiology Studies: DG Chest 1 View  Result Date: 02/16/2021 CLINICAL DATA:  Status post thoracentesis EXAM: CHEST  1 VIEW COMPARISON:  02/14/2011 FINDINGS: Numerous leads and wires project over the chest. Midline trachea. Cardiomegaly accentuated by AP portable technique. Atherosclerosis in the  transverse aorta. Small inferolateral right-sided pneumothorax, with visceral pleural line on the order of 11 mm from the chest wall. Trace apical component as well. Decreased small right pleural effusion. Persistent small left pleural effusion. Mild interstitial edema. Improved right and persistent left base airspace disease. IMPRESSION: Small right-sided pneumothorax. Decreased right-sided pleural fluid and adjacent atelectasis. Persistent small left pleural effusion with adjacent atelectasis. Cardiomegaly with mild interstitial edema. These results will be called to the ordering clinician or representative by the Radiologist Assistant, and communication documented in the PACS or [employer]. Electronically Signed   By: Abigail Miyamoto M.D.   On: 02/16/2021 16:49   DG Chest Port 1  View  Result Date: 02/18/2021 CLINICAL DATA:  Right-sided pneumothorax EXAM: PORTABLE CHEST 1 VIEW COMPARISON:  Yesterday FINDINGS: Numerous leads and wires project over the chest. Patient rotated right. Cardiomegaly accentuated by AP portable technique. Small loculated right-sided hydropneumothorax inferiorly and laterally is minimally decreased. Small left pleural effusion persists. Mild interstitial edema is slightly improved. Persistent bibasilar airspace disease IMPRESSION: Slight decrease in small inferolateral right hydropneumothorax. Improvement in congestive heart failure with persistent small left pleural effusion and bibasilar atelectasis. Electronically Signed   By: Abigail Miyamoto M.D.   On: 02/18/2021 08:17   DG Chest Port 1 View  Result Date: 02/17/2021 CLINICAL DATA:  Right-sided pneumothorax. EXAM: PORTABLE CHEST 1 VIEW COMPARISON:  02/16/2021 and CT chest 12/14/2020. FINDINGS: Trachea is midline. Heart is enlarged. Mixed interstitial and airspace opacification bilaterally, worsened since 02/16/2021. Small amount of loculated air at the lateral base of the right hemithorax appears similar. Bilateral pleural  effusions. IMPRESSION: 1. Small loculated right hydropneumothorax. 2. Congestive heart failure. Electronically Signed   By: Lorin Picket M.D.   On: 02/17/2021 08:36   DG Chest Port 1 View  Result Date: 02/16/2021 CLINICAL DATA:  Right-sided pneumothorax. EXAM: PORTABLE CHEST 1 VIEW COMPARISON:  Chest radiograph dated 02/16/2021. FINDINGS: Slight interval increase in the right basilar pneumothorax since the earlier radiograph. There is cardiomegaly with vascular congestion and edema. Small bilateral pleural effusions. Left lung base atelectasis or infiltrate. Atherosclerotic calcification of the aorta. Degenerative changes of spine. No acute osseous pathology. IMPRESSION: 1. Slight interval increase in the right basilar pneumothorax since the earlier radiograph. 2. Cardiomegaly with vascular congestion and edema. 3. Small bilateral pleural effusions. Electronically Signed   By: Anner Crete M.D.   On: 02/16/2021 21:45   US THORACENTESIS ASP PLEURAL SPACE W/IMG GUIDE  Result Date: 02/16/2021 INDICATION: Patient with history of pulmonary hypertension, CHF, non STEMI, coronary artery disease, chronic kidney disease, recurrent pleural effusions. Request received for therapeutic right thoracentesis. EXAM: ULTRASOUND GUIDED THERAPEUTIC RIGHT THORACENTESIS MEDICATIONS: 10 mL 1% lidocaine COMPLICATIONS: None immediate. PROCEDURE: An ultrasound guided thoracentesis was thoroughly discussed with the patient/daughter and questions answered. The benefits, risks, alternatives and complications were also discussed. The patient understands and wishes to proceed with the procedure. Written consent was obtained. Ultrasound was performed to localize and mark an adequate pocket of fluid in the right chest. The area was then prepped and draped in the normal sterile fashion. 1% Lidocaine was used for local anesthesia. Under ultrasound guidance a 6 Fr Safe-T-Centesis catheter was introduced. Thoracentesis was performed. The  catheter was removed and a dressing applied. FINDINGS: A total of approximately 710 cc of hazy, amber fluid was removed. IMPRESSION: Successful ultrasound guided therapeutic right thoracentesis yielding 710 cc of pleural fluid. Read by: Rowe Robert, PA-C Electronically Signed   By: Lucrezia Europe M.D.   On: 02/16/2021 16:51    Scheduled Meds:  arformoterol  15 mcg Nebulization BID   atorvastatin  80 mg Oral QPM   carvedilol  12.5 mg Oral BID WC   Chlorhexidine Gluconate Cloth  6 each Topical Daily   clopidogrel  75 mg Oral Daily   fluticasone  1 spray Each Nare Daily   gabapentin  100 mg Oral QHS   heparin injection (subcutaneous)  5,000 Units Subcutaneous Q8H   hydrALAZINE  25 mg Oral Q8H   mouth rinse  15 mL Mouth Rinse BID   melatonin  10 mg Oral QHS   methimazole  2.5 mg Oral Daily   pantoprazole  40 mg  Oral q morning   revefenacin  175 mcg Nebulization Daily   sildenafil  20 mg Oral TID   sodium chloride flush  3 mL Intravenous Q12H   torsemide  40 mg Oral Daily   Continuous Infusions:  Time spent ; 50 MINS   LOS: 15 days   Shawna Clamp, MD [facility] P2/16/2023, 1:27 PM

## 2021-02-18 NOTE — Progress Notes (Signed)
Interventional Radiology Brief Note:  Patient assessed at bedside this afternoon.  She is stable on 2-3L [place] as has been her baseline prior to thoracentesis and for the past several days since.  She denies changes to her breathing or increased shortness of breath.   CXR today shows possible slight improvement in PTX, but overall PTX has been stable. Likely represents ex vacuole PTX.  No need for further daily CXRs at this time.  Recommend repeating imaging if patient's clinically changes.   IR remains available if needed.  Brynda Greathouse, MS RD PA-C

## 2021-02-19 LAB — GLUCOSE, CAPILLARY
Glucose-Capillary: 120 mg/dL — ABNORMAL HIGH (ref 70–99)
Glucose-Capillary: 163 mg/dL — ABNORMAL HIGH (ref 70–99)
Glucose-Capillary: 152 mg/dL — ABNORMAL HIGH (ref 70–99)
Glucose-Capillary: 142 mg/dL — ABNORMAL HIGH (ref 70–99)

## 2021-02-19 NOTE — Progress Notes (Signed)
RT inquired about bipap, Pt shook her head and said no. Machine remained bedside if needed.

## 2021-02-19 NOTE — Progress Notes (Signed)
Physical Therapy Treatment Patient Details Name: Mandy [name] MRN: [id number] DOB: Jan 22, 1940 Today's Date: 02/19/2021   History of Present Illness 81 yo female admitted with acute on chronic resp failure, acute on chronic HF, bil pleural effusion. Hx of anemia, severe pulm HTN, DM, HF, CVA, CAD, CKD. Pt is non English speaking-family interprets-hospital interpreter not available    PT Comments    General Comments: follows directions well with visual cues.  Pt OOB in recliner on 2 lts nasal sats avg 96%.  Assisted with amb in hallway.  General transfer comment: light assist to power up from recliner and VC's safety with turns. General Gait Details: 50% VC's on proper walker to self distance as pt tends to push walker out too far.  Had to increase her oxygen from 2 to 4 lts during ambulation.  Pt tolerated an increased diatnce of 75 feet but HR increased to mid 130's and pt exhibited 2/4 dyspnea at end.  Daughter present and assisted .  Pt plans to return home with family support.   Recommendations for follow up therapy are one component of a multi-disciplinary discharge planning process, led by the attending physician.  Recommendations may be updated based on patient status, additional functional criteria and insurance authorization.  Follow Up Recommendations  Home health PT     Assistance Recommended at Discharge Frequent or constant Supervision/Assistance  Patient can return home with the following Assist for transportation;Help with stairs or ramp for entrance;Assistance with cooking/housework;A little help with walking and/or transfers;A little help with bathing/dressing/bathroom;Direct supervision/assist for medications management   Equipment Recommendations  None recommended by PT    Recommendations for Other Services       Precautions / Restrictions Precautions Precautions: Fall Precaution Comments: 2 lts Home oxygen Restrictions Weight Bearing Restrictions: No      Mobility  Bed Mobility               General bed mobility comments: OOB in recliner    Transfers Overall transfer level: Needs assistance Equipment used: Rolling walker (2 wheels) Transfers: Sit to/from Stand Sit to Stand: Supervision, Min guard           General transfer comment: light assist to power up from recliner and VC's safety with turns.    Ambulation/Gait Ambulation/Gait assistance: Min guard, Supervision Gait Distance (Feet): 75 Feet Assistive device: Rolling walker (2 wheels) Gait Pattern/deviations: Step-to pattern, Decreased step length - right, Decreased step length - left, Decreased stride length, Shuffle, Trunk flexed Gait velocity: decr     General Gait Details: 50% VC's on proper walker to self distance as pt tends to push walker out too far.  Had to increase her oxygen from 2 to 4 lts during ambulation.  Pt tolerated an increased diatnce of 75 feet but HR increased to mid 130's and pt exhibited 2/4 dyspnea at end.  Daughter present and assisted .   Stairs             Wheelchair Mobility    Modified Rankin (Stroke Patients Only)       Balance                                            Cognition Arousal/Alertness: Awake/alert  General Comments: follows directions well with visual cues        Exercises      General Comments        Pertinent Vitals/Pain Pain Assessment Pain Assessment: No/denies pain    Home Living                          Prior Function            PT Goals (current goals can now be found in the care plan section) Progress towards PT goals: Progressing toward goals    Frequency    Min 3X/week      PT Plan Current plan remains appropriate    Co-evaluation              AM-PAC PT "6 Clicks" Mobility   Outcome Measure  Help needed turning from your back to your side while in a flat bed without using  bedrails?: A Little Help needed moving from lying on your back to sitting on the side of a flat bed without using bedrails?: A Little Help needed moving to and from a bed to a chair (including a wheelchair)?: A Little Help needed standing up from a chair using your arms (e.g., wheelchair or bedside chair)?: A Little Help needed to walk in hospital room?: A Little Help needed climbing 3-5 steps with a railing? : A Lot 6 Click Score: 17    End of Session Equipment Utilized During Treatment: Gait belt;Oxygen Activity Tolerance: Patient tolerated treatment well Patient left: in chair;with call bell/phone within reach;with chair alarm set;with family/visitor present Nurse Communication: Mobility status PT Visit Diagnosis: Muscle weakness (generalized) (M62.81);Difficulty in walking, not elsewhere classified (R26.2)     Time: 1335-1400 PT Time Calculation (min) (ACUTE ONLY): 25 min  Charges:  $Gait Training: 8-22 mins $Therapeutic Activity: 8-22 mins                     {Epiphany Seltzer  PTA Acute  Rehabilitation Services Pager      [phone] Office      [phone]

## 2021-02-19 NOTE — Progress Notes (Signed)
PROGRESS NOTE  Mandy [name]  OZD:[id number] DOB: Sep 30, 1940 DOA: 02/02/2021 PCP: [name], MD   Brief Narrative: This 81 year old female with PMH significant for severe pulmonary hypertension, hyperlipidemia, diabetes type 2, chronic pleural effusion, goiter, hypothyroidism, chronic diastolic CHF, chronic hypoxic/hypercarbic respiratory failure on 2 L of oxygen at home who presented to the emergency department on 02/03/2021 from sleep center after her saturation dropped to 30% for 10 minutes.  Patient also reporting shortness of breath for last 2 to 3 weeks before admission.  On presentation, chest x-ray showed cardiomegaly with moderate pulmonary edema, moderate right-sided pleural effusion, stable small left-sided pleural effusion.  Patient was admitted for the management of acute on chronic hypoxic/hypercapnic respiratory failure secondary to CHF exacerbation, pulm hypertension.  Cardiology was also consulted after admission, started on IV diuresis.  Hospital course remarkable for worsening dyspnea not responding to BiPAP and she had to be transferred to ICU for close observation.  Her overall condition has improved.  Cardiology have signed off.  PT/OT recommending home health.  Discharge planning in next 2-3 days.  High risk for readmission.  Important events:   2/1 admit to hospitalists 2/2 PCCM consulted  for hypercarbia 2/3 Patient had loss of consciousness with O2 desaturation with movement. 2/5 milrinone dose titrated down, tolerated well 2/6 milrinone off, started sildenafil 2/9 thoracentesis 600cc, probably pseudoexudate in setting diuresis (exudative by protein only), cyto pending 2/11 she complained of chest pain and troponins were elevated to 1,232 and 1,166.  She was started on IV heparin drip 2/14 heparin drip discontinued after 48 hours of conservative management per cardiology recommendations. She underwent left-sided thoracentesis .  PT evaluation ordered and  pending.  Assessment & Plan:  Principal Problem:   Acute on chronic respiratory failure with hypoxia and hypercapnia (HCC) Active Problems:   Diabetes mellitus type 2, controlled ([place])   Hyperthyroidism   Hyperkalemia   Acute renal failure superimposed on stage 3a chronic kidney disease (HCC)   Severe pulmonary hypertension (HCC)   Acute on chronic diastolic CHF (congestive heart failure) (HCC)  Acute on chronic hypoxic and hypercapnic respiratory failure. She uses 2 L of oxygen at home at baseline.   She does have chronic hypoxia secondary to CHF, severe pulmonary hypertension, pleural effusion. She presented with desaturation from sleep study center.  She was complaining of dyspnea.   Cardiology was consulted on admission.Echo showed LVEF 60 to 25%, grade 1 diastolic dysfunction, severe pulmonary hypertension with pulmonary artery systolic pressure of 956 mmHg.   She was on milrinone, transitioned to sildenafil now.   VQ scan negative for pulmonary embolism. Continue BiPAP at night.  Continue sildenafil, torsemide. PCCM will arrange follow-up as an outpatient, She is supposed to follow-up with Dr. Silas Flood for her pulmonary hypertension.   No options for discharging her with a CPAP or BiPAP because insurance will not cover.   She needs to finish the sleep study for CPAP. Continue torsemide for diuresis.  She appears euvolemic. She is back to her baseline O2 requirement.  Pleural effusion /small loculated hydropneumothorax: She underwent right thoracentesis multiple times, last one  2/14 with removal of 710 cc of fluid, resulting in small loculated right hydropneumothorax.   Currently respiratory status stable.  Continue conservative management.  Repeat chest x-ray shows improving pneumothorax.  IR is aware.  Elevated Troponin: Recent LHC on 11/20 had shown 30% LAD disease, nonobstructive CAD.Marland Kitchen   No further work-up planned by cardiology.  Currently denies any chest pain.    Continue  Plavix,  Coreg, Lipitor.  Not a candidate for cath because of severe pulmonary hypertension.   She was initially started on heparin drip now discontinued after 48 hrs.  Essential Hypertension: Currently blood pressure stable.  Continue current medications.  AKI on CKD stage IIIa : Currently kidney function at baseline.  Her baseline creatinine fluctuates from 1-1.4.    History of cryptogenic stroke.  Continue Plavix, Lipitor  Hyponatremia, mild: > Resolved. Likely secondary to some component of volume overload.  Continue torsemide.  Chronic anemia : Her baseline Hb ranges from 9-10.  Hemoglobin has trended down to 7.3> 8.3   No report of hematochezia or melena.  Continue monitoring  Goals of care: She has multiple hospitalization.  She remains full code.  Continue palliative care consult.  Deconditioning : Prolonged hospitalization.  PT has recommended home health.   Plan to discharge in next 1-2 days if she remains stable.   She has high risk of readmission be due to her significant lung and cardiac problems      DVT prophylaxis: Heparin     Code Status: Full Code  Family Communication: Spoke with daughter on the phone.  Patient status:Inpatient  Patient is from :Home  Anticipated discharge WG:NFAO  Estimated DC date: 02/20/21   Consultants: Cardiology, palliative care, PCCM  Procedures: Thoracentesis  Antimicrobials:  Anti-infectives (From admission, onward)    None       Subjective: Patient was seen and examined at bedside.  Overnight events noted.   She appears very comfortable.  She is now at her baseline oxygen requirement.   She does not appear in respiratory distress,  appears euvolemic. She reports feeling very weak and deconditioned.   Objective: Vitals:   02/19/21 0700 02/19/21 0739 02/19/21 0808 02/19/21 0817  BP:  (!) 141/53    Pulse:      Resp: (!) 21     Temp:    98.3 F (36.8 C)  TempSrc:    Oral  SpO2:   99%   Weight:       Height:        Intake/Output Summary (Last 24 hours) at 02/19/2021 1125 Last data filed at 02/18/2021 2150 Gross per 24 hour  Intake 636 ml  Output 200 ml  Net 436 ml   Filed Weights   02/15/21 0500 02/17/21 0500 02/18/21 0500  Weight: 70.8 kg 70 kg 70.4 kg    Examination:  General exam: Deconditioned, comfortable, not in any acute distress. HEENT: PERRL Respiratory system: Clear to auscultation bilaterally, no accessory muscle use, respiratory effort normal. Cardiovascular system: S1-S2 heard, regular rate and rhythm, no murmur. Gastrointestinal system: Abdomen is soft, non tender, non distended, BS+ Central nervous system: Alert and oriented X 2 Extremities: No edema, no clubbing ,no cyanosis Skin: No rashes, no ulcers,no icterus     Data Reviewed: I have personally reviewed following labs and imaging studies  CBC: Recent Labs  Lab 02/14/21 0643 02/15/21 0013 02/16/21 0301 02/17/21 0257 02/18/21 0253 02/18/21 1351  WBC 6.1 6.5 6.3 6.9 6.5  --   NEUTROABS  --   --   --   --  4.3  --   HGB 8.4* 8.5* 8.3* 8.3* 7.3* 8.3*  HCT 27.3* 26.9* 26.9* 26.2* 23.2* 26.4*  MCV 81.5 81.3 80.1 79.4* 78.9*  --   PLT 323 358 325 370 352  --    Basic Metabolic Panel: Recent Labs  Lab 02/15/21 0013 02/16/21 0301 02/16/21 2327 02/17/21 0257 02/18/21 0253  NA 136 134* 132* 133*  135  K 3.9 4.0 3.9 3.8 3.7  CL 86* 86* 87* 87* 89*  CO2 42* 40* 38* 38* 38*  GLUCOSE 124* 107* 140* 135* 167*  BUN 38* 40* 46* 45* 49*  CREATININE 1.29* 1.35* 1.56* 1.45* 1.68*  CALCIUM 9.0 8.8* 8.7* 8.6* 8.5*  MG  --   --  1.9  --   --      Recent Results (from the past 240 hour(s))  Body fluid culture w Gram Stain     Status: None   Collection Time: 02/11/21  4:24 PM   Specimen: Pleural Fluid  Result Value Ref Range Status   Specimen Description   Final    PLEURAL Performed at [facility] [address], [place], [place] [address]    Special Requests   Final     NONE Performed at [facility], [place] [address], [place], [place] [address]    Gram Stain   Final    NO SQUAMOUS EPITHELIAL CELLS SEEN FEW WBC SEEN NO ORGANISMS SEEN    Culture   Final    NO GROWTH 3 DAYS Performed at [facility], [place] [address], [place], [place] [address]    Report Status 02/15/2021 FINAL  Final  Fungus Culture With Stain     Status: None (Preliminary result)   Collection Time: 02/11/21  4:25 PM   Specimen: Pleural Fluid  Result Value Ref Range Status   Fungus Stain Final report  Final    Comment: (NOTE) Performed At: [facility] [address] [place], [place] [id number] Rush Farmer MD [id number]    Fungus (Mycology) Culture PENDING  Incomplete   Fungal Source PLEURAL  Final    Comment: Performed at [facility], [place] [address], [place], [place] [address]  Fungus Culture Result     Status: None   Collection Time: 02/11/21  4:25 PM  Result Value Ref Range Status   Result 1 Comment  Final    Comment: (NOTE) KOH/Calcofluor preparation:  no fungus observed. Performed At: [facility] [place], [place] [id number] Rush Farmer MD [id number]      Radiology Studies: DG Chest Port 1 View  Result Date: 02/18/2021 CLINICAL DATA:  Right-sided pneumothorax EXAM: PORTABLE CHEST 1 VIEW COMPARISON:  Yesterday FINDINGS: Numerous leads and wires project over the chest. Patient rotated right. Cardiomegaly accentuated by AP portable technique. Small loculated right-sided hydropneumothorax inferiorly and laterally is minimally decreased. Small left pleural effusion persists. Mild interstitial edema is slightly improved. Persistent bibasilar airspace disease IMPRESSION: Slight decrease in small inferolateral right hydropneumothorax. Improvement in congestive heart failure with persistent small left pleural effusion and bibasilar atelectasis. Electronically Signed   By: Abigail Miyamoto M.D.   On:  02/18/2021 08:17    Scheduled Meds:  arformoterol  15 mcg Nebulization BID   atorvastatin  80 mg Oral QPM   carvedilol  12.5 mg Oral BID WC   Chlorhexidine Gluconate Cloth  6 each Topical Daily   clopidogrel  75 mg Oral Daily   fluticasone  1 spray Each Nare Daily   gabapentin  100 mg Oral QHS   heparin injection (subcutaneous)  5,000 Units Subcutaneous Q8H   hydrALAZINE  25 mg Oral Q8H   mouth rinse  15 mL Mouth Rinse BID   melatonin  10 mg Oral QHS   methimazole  2.5 mg Oral Daily   pantoprazole  40 mg Oral q morning   revefenacin  175 mcg Nebulization Daily  sildenafil  20 mg Oral TID   sodium chloride flush  3 mL Intravenous Q12H   torsemide  40 mg Oral Daily   Continuous Infusions:  Time spent : 35 MINS   LOS: 16 days   Shawna Clamp, MD [facility] P2/17/2023, 11:25 AM

## 2021-02-19 NOTE — Progress Notes (Signed)
Pt refuses to allow lab to re-stick her after the first try was missed. Explained to pt the importance of lab work, but pt is adamant.

## 2021-02-19 NOTE — Progress Notes (Signed)
Pt refused bipap for the nights

## 2021-02-20 LAB — CBC
HCT: 25 % — ABNORMAL LOW (ref 36.0–46.0)
Hemoglobin: 7.7 g/dL — ABNORMAL LOW (ref 12.0–15.0)
MCH: 24.3 pg — ABNORMAL LOW (ref 26.0–34.0)
MCHC: 30.8 g/dL (ref 30.0–36.0)
MCV: 78.9 fL — ABNORMAL LOW (ref 80.0–100.0)
Platelets: 417 10*3/uL — ABNORMAL HIGH (ref 150–400)
RBC: 3.17 MIL/uL — ABNORMAL LOW (ref 3.87–5.11)
RDW: 15.9 % — ABNORMAL HIGH (ref 11.5–15.5)
WBC: 5.6 10*3/uL (ref 4.0–10.5)
nRBC: 0 % (ref 0.0–0.2)

## 2021-02-20 LAB — GLUCOSE, CAPILLARY
Glucose-Capillary: 164 mg/dL — ABNORMAL HIGH (ref 70–99)
Glucose-Capillary: 165 mg/dL — ABNORMAL HIGH (ref 70–99)
Glucose-Capillary: 152 mg/dL — ABNORMAL HIGH (ref 70–99)
Glucose-Capillary: 185 mg/dL — ABNORMAL HIGH (ref 70–99)

## 2021-02-20 LAB — BASIC METABOLIC PANEL
Anion gap: 6 (ref 5–15)
BUN: 41 mg/dL — ABNORMAL HIGH (ref 8–23)
CO2: 39 mmol/L — ABNORMAL HIGH (ref 22–32)
Calcium: 8.7 mg/dL — ABNORMAL LOW (ref 8.9–10.3)
Chloride: 90 mmol/L — ABNORMAL LOW (ref 98–111)
Creatinine, Ser: 1.43 mg/dL — ABNORMAL HIGH (ref 0.44–1.00)
GFR, Estimated: 37 mL/min — ABNORMAL LOW (ref >60)
Glucose, Bld: 103 mg/dL — ABNORMAL HIGH (ref 70–99)
Potassium: 3.3 mmol/L — ABNORMAL LOW (ref 3.5–5.1)
Sodium: 135 mmol/L (ref 135–145)

## 2021-02-20 LAB — PHOSPHORUS: Phosphorus: 3.9 mg/dL (ref 2.5–4.6)

## 2021-02-20 LAB — MAGNESIUM: Magnesium: 2 mg/dL (ref 1.7–2.4)

## 2021-02-20 MED ORDER — POTASSIUM CHLORIDE 20 MEQ PO PACK
40.0000 meq | PACK | Freq: Once | ORAL | Status: AC
  Administered 2021-02-20: 40 meq via ORAL
  Filled 2021-02-20: qty 2

## 2021-02-20 NOTE — Plan of Care (Signed)
Pt stable overnight, c/o of some heartburn alleviated by PRN. Pt standby to BSCx1. NSR on telemetry. Adequate UOP. VSS.

## 2021-02-20 NOTE — Progress Notes (Signed)
PROGRESS NOTE  Mandy [name]  FIE:[id number] DOB: 1940-01-06 DOA: 02/02/2021 PCP: [name], MD   Brief Narrative: This 81 year old female with PMH significant for severe pulmonary hypertension, hyperlipidemia, diabetes type 2, chronic pleural effusion, goiter, hypothyroidism, chronic diastolic CHF, chronic hypoxic/hypercarbic respiratory failure on 2 L of oxygen at home who presented to the emergency department on 02/03/2021 from sleep center after her saturation dropped to 30% for 10 minutes.  Patient also reporting shortness of breath for last 2 to 3 weeks before admission.  On presentation, chest x-ray showed cardiomegaly with moderate pulmonary edema, moderate right-sided pleural effusion, stable small left-sided pleural effusion.  Patient was admitted for the management of acute on chronic hypoxic/hypercapnic respiratory failure secondary to CHF exacerbation, pulm hypertension.  Cardiology was also consulted after admission, started on IV diuresis.  Hospital course remarkable for worsening dyspnea not responding to BiPAP and she had to be transferred to ICU for close observation.  Her overall condition has improved.  Cardiology have signed off.  PT/OT recommending home health.  Discharge planning in next 2-3 days.  High risk for readmission.  Important events:   2/1 admit to hospitalists 2/2 PCCM consulted  for hypercarbia 2/3 Patient had loss of consciousness with O2 desaturation with movement. 2/5 milrinone dose titrated down, tolerated well 2/6 milrinone off, started sildenafil 2/9 thoracentesis 600cc, probably pseudoexudate in setting diuresis (exudative by protein only), cyto pending 2/11 she complained of chest pain and troponins were elevated to 1,232 and 1,166.  She was started on IV heparin drip 2/14 heparin drip discontinued after 48 hours of conservative management per cardiology recommendations. She underwent left-sided thoracentesis .  PT evaluation ordered and  pending.  Assessment & Plan:  Principal Problem:   Acute on chronic respiratory failure with hypoxia and hypercapnia (HCC) Active Problems:   Diabetes mellitus type 2, controlled ([place])   Hyperthyroidism   Hyperkalemia   Acute renal failure superimposed on stage 3a chronic kidney disease (HCC)   Severe pulmonary hypertension (HCC)   Acute on chronic diastolic CHF (congestive heart failure) (HCC)  Acute on chronic hypoxic and hypercapnic respiratory failure. She uses 2 L of oxygen at home at baseline.   She does have chronic hypoxia secondary to CHF, severe pulmonary hypertension, pleural effusion. She presented with desaturation from sleep study center.  She was complaining of dyspnea.   Cardiology was consulted on admission.Echo showed LVEF 60 to 16%, grade 1 diastolic dysfunction, severe pulmonary hypertension with pulmonary artery systolic pressure of 606 mmHg.   She was on milrinone, transitioned to sildenafil now.   VQ scan negative for pulmonary embolism. Continue BiPAP at night.  Continue sildenafil, torsemide. PCCM will arrange follow-up as an outpatient, She is supposed to follow-up with Dr. Silas Flood for her pulmonary hypertension.   No options for discharging her with a CPAP or BiPAP because insurance will not cover.   She needs to finish the sleep study for CPAP. Continue torsemide for diuresis.  She appears euvolemic. She is back to her baseline O2 requirement.  Pleural effusion /small loculated hydropneumothorax: She underwent right thoracentesis multiple times, last one  2/14 with removal of 710 cc of fluid, resulting in small loculated right hydropneumothorax.   Currently respiratory status stable.  Continue conservative management.  Repeat chest x-ray shows improving pneumothorax.  IR is aware.  Elevated Troponin: Recent LHC on 11/20 had shown 30% LAD disease, nonobstructive CAD.Marland Kitchen   No further work-up planned by cardiology.  Currently denies any chest pain.    Continue  Plavix,  Coreg, Lipitor.  Not a candidate for cath because of severe pulmonary hypertension.   She was initially started on heparin drip now discontinued after 48 hrs.  Essential Hypertension: Currently blood pressure stable.  Continue current medications.  AKI on CKD stage IIIa : Currently kidney function at baseline.  Her baseline creatinine fluctuates from 1-1.4.    History of cryptogenic stroke.  Continue Plavix, Lipitor  Hyponatremia, mild: > Resolved. Likely secondary to some component of volume overload.  Continue torsemide.  Chronic anemia : Her baseline Hb ranges from 9-10.  Hemoglobin has trended down to 7.3> 8.3 >7.7 No report of hematochezia or melena.  Continue monitoring  Goals of care: She has multiple hospitalization.  She remains full code.  Continue palliative care consult.  Deconditioning : Prolonged hospitalization.  PT has recommended home health.   Plan to discharge in next 1-2 days if she remains stable.   She has high risk of readmission be due to her significant lung and cardiac problems      DVT prophylaxis: Heparin     Code Status: Full Code  Family Communication: Spoke with daughter on the phone.  Patient status:Inpatient  Patient is from :Home  Anticipated discharge HY:QMVH  Estimated DC date: 02/21/21   Consultants: Cardiology, palliative care, PCCM  Procedures: Thoracentesis  Antimicrobials:  Anti-infectives (From admission, onward)    None       Subjective: Patient was seen and examined at bedside.  Overnight events noted.  She appears very comfortable.  She is now at her baseline oxygen requirement. He does not appear in respiratory distress appears euvolemic. She reports feeling very weak and deconditioned.   Objective: Vitals:   02/20/21 0500 02/20/21 0531 02/20/21 0858 02/20/21 1241  BP:  (!) 141/55 140/60 118/63  Pulse:  86 80 86  Resp:  20 (!) 24 (!) 22  Temp:  99.2 F (37.3 C) 98.4 F (36.9 C) 98.1  F (36.7 C)  TempSrc:  Axillary Oral Oral  SpO2:  100% 100% 100%  Weight: 72.6 kg     Height:        Intake/Output Summary (Last 24 hours) at 02/20/2021 1307 Last data filed at 02/20/2021 1234 Gross per 24 hour  Intake 480 ml  Output 475 ml  Net 5 ml   Filed Weights   02/17/21 0500 02/18/21 0500 02/20/21 0500  Weight: 70 kg 70.4 kg 72.6 kg    Examination:  General exam: Appears comfortable, deconditioned, not in any acute distress. HEENT: PERRL Respiratory system: CTA bilaterally, no accessory muscle use, respiratory effort normal . Cardiovascular system: S1-S2 heard, regular rate and rhythm, no murmur. Gastrointestinal system: Abdomen is soft, non tender, non distended, BS+ Central nervous system: Alert and oriented X 2 Extremities: No edema, no clubbing ,no cyanosis Skin: No rashes, no ulcers,no icterus     Data Reviewed: I have personally reviewed following labs and imaging studies  CBC: Recent Labs  Lab 02/15/21 0013 02/16/21 0301 02/17/21 0257 02/18/21 0253 02/18/21 1351 02/20/21 0552  WBC 6.5 6.3 6.9 6.5  --  5.6  NEUTROABS  --   --   --  4.3  --   --   HGB 8.5* 8.3* 8.3* 7.3* 8.3* 7.7*  HCT 26.9* 26.9* 26.2* 23.2* 26.4* 25.0*  MCV 81.3 80.1 79.4* 78.9*  --  78.9*  PLT 358 325 370 352  --  846*   Basic Metabolic Panel: Recent Labs  Lab 02/16/21 0301 02/16/21 2327 02/17/21 0257 02/18/21 0253 02/20/21 0552  NA 134*  132* 133* 135 135  K 4.0 3.9 3.8 3.7 3.3*  CL 86* 87* 87* 89* 90*  CO2 40* 38* 38* 38* 39*  GLUCOSE 107* 140* 135* 167* 103*  BUN 40* 46* 45* 49* 41*  CREATININE 1.35* 1.56* 1.45* 1.68* 1.43*  CALCIUM 8.8* 8.7* 8.6* 8.5* 8.7*  MG  --  1.9  --   --  2.0  PHOS  --   --   --   --  3.9     Recent Results (from the past 240 hour(s))  Body fluid culture w Gram Stain     Status: None   Collection Time: 02/11/21  4:24 PM   Specimen: Pleural Fluid  Result Value Ref Range Status   Specimen Description   Final    PLEURAL Performed at  [facility], [place] [address] [place], [place] [address]    Special Requests   Final    NONE Performed at [facility], [place] [address], [place], [place] [address]    Gram Stain   Final    NO SQUAMOUS EPITHELIAL CELLS SEEN FEW WBC SEEN NO ORGANISMS SEEN    Culture   Final    NO GROWTH 3 DAYS Performed at [facility], [place] [address] [place], [place] [address]    Report Status 02/15/2021 FINAL  Final  Fungus Culture With Stain     Status: None (Preliminary result)   Collection Time: 02/11/21  4:25 PM   Specimen: Pleural Fluid  Result Value Ref Range Status   Fungus Stain Final report  Final    Comment: (NOTE) Performed At: [facility] [address] [place], [place] [id number] Rush Farmer MD [id number]    Fungus (Mycology) Culture PENDING  Incomplete   Fungal Source PLEURAL  Final    Comment: Performed at [facility], [place] [address], [place], [place] [address]  Fungus Culture Result     Status: None   Collection Time: 02/11/21  4:25 PM  Result Value Ref Range Status   Result 1 Comment  Final    Comment: (NOTE) KOH/Calcofluor preparation:  no fungus observed. Performed At: [facility] [place], [place] [id number] Rush Farmer MD [id number]      Radiology Studies: No results found.  Scheduled Meds:  arformoterol  15 mcg Nebulization BID   atorvastatin  80 mg Oral QPM   carvedilol  12.5 mg Oral BID WC   Chlorhexidine Gluconate Cloth  6 each Topical Daily   clopidogrel  75 mg Oral Daily   fluticasone  1 spray Each Nare Daily   gabapentin  100 mg Oral QHS   heparin injection (subcutaneous)  5,000 Units Subcutaneous Q8H   hydrALAZINE  25 mg Oral Q8H   mouth rinse  15 mL Mouth Rinse BID   melatonin  10 mg Oral QHS   methimazole  2.5 mg Oral Daily   pantoprazole  40 mg Oral q morning   revefenacin  175 mcg Nebulization Daily   sildenafil  20 mg Oral TID    sodium chloride flush  3 mL Intravenous Q12H   torsemide  40 mg Oral Daily   Continuous Infusions:  Time spent : 35 MINS   LOS: 17 days   Shawna Clamp, MD [facility] P2/18/2023, 1:07 PM

## 2021-02-20 NOTE — Progress Notes (Signed)
Pt refused bipap again. Machine remained in the room if needed. No resp distress noted, Pt on 3L [place] resting well.

## 2021-02-21 LAB — HEMOGLOBIN AND HEMATOCRIT, BLOOD
HCT: 24.2 % — ABNORMAL LOW (ref 36.0–46.0)
Hemoglobin: 7.5 g/dL — ABNORMAL LOW (ref 12.0–15.0)

## 2021-02-21 LAB — CBC
HCT: 23.5 % — ABNORMAL LOW (ref 36.0–46.0)
Hemoglobin: 7.4 g/dL — ABNORMAL LOW (ref 12.0–15.0)
MCH: 24.9 pg — ABNORMAL LOW (ref 26.0–34.0)
MCHC: 31.5 g/dL (ref 30.0–36.0)
MCV: 79.1 fL — ABNORMAL LOW (ref 80.0–100.0)
Platelets: 405 10*3/uL — ABNORMAL HIGH (ref 150–400)
RBC: 2.97 MIL/uL — ABNORMAL LOW (ref 3.87–5.11)
RDW: 15.9 % — ABNORMAL HIGH (ref 11.5–15.5)
WBC: 5.1 10*3/uL (ref 4.0–10.5)
nRBC: 0 % (ref 0.0–0.2)

## 2021-02-21 LAB — GLUCOSE, CAPILLARY
Glucose-Capillary: 116 mg/dL — ABNORMAL HIGH (ref 70–99)
Glucose-Capillary: 146 mg/dL — ABNORMAL HIGH (ref 70–99)
Glucose-Capillary: 104 mg/dL — ABNORMAL HIGH (ref 70–99)

## 2021-02-21 LAB — COOXEMETRY PANEL
Carboxyhemoglobin: 1.2 % (ref 0.5–1.5)
Methemoglobin: 1.2 % (ref 0.0–1.5)
O2 Saturation: 46.7 %
Total hemoglobin: 8.4 g/dL — ABNORMAL LOW (ref 12.0–16.0)

## 2021-02-21 NOTE — Progress Notes (Signed)
RN called RT to place pt on the bipap per pts daughters request. RN called pts daughter and she spoke to pt. Pt said ok for now. Pt is on bipap at this time and tolerating it well.

## 2021-02-21 NOTE — Progress Notes (Signed)
PROGRESS NOTE  Mandy [name]  EGB:[id number] DOB: 06/19/1940 DOA: 02/02/2021  PCP: [name], MD   Brief Narrative: This 81 year old female with PMH significant for severe pulmonary hypertension, hyperlipidemia, diabetes type 2, chronic pleural effusion, goiter, hypothyroidism, chronic diastolic CHF, chronic hypoxic/hypercarbic respiratory failure on 2 L of oxygen at home who presented to the emergency department on 02/03/2021 from sleep center after her saturation dropped to 30% for 10 minutes.  Patient also reporting shortness of breath for last 2 to 3 weeks before admission.  On presentation, chest x-ray showed cardiomegaly with moderate pulmonary edema, moderate right-sided pleural effusion, stable small left-sided pleural effusion.  Patient was admitted for the management of acute on chronic hypoxic/hypercapnic respiratory failure secondary to CHF exacerbation, pulm hypertension.  Cardiology was also consulted after admission, started on IV diuresis.  Hospital course remarkable for worsening dyspnea not responding to BiPAP and she had to be transferred to ICU for close observation.  Her overall condition has improved.  Cardiology have signed off.  PT/OT recommending home health.  Discharge planning in next 2-3 days.  High risk for readmission.  Important events:   2/1 admit to hospitalists 2/2 PCCM consulted  for hypercarbia 2/3 Patient had loss of consciousness with O2 desaturation with movement. 2/5 milrinone dose titrated down, tolerated well 2/6 milrinone off, started sildenafil 2/9 thoracentesis 600cc, probably pseudoexudate in setting diuresis (exudative by protein only), cyto pending 2/11 she complained of chest pain and troponins were elevated to 1,232 and 1,166.  She was started on IV heparin drip 2/14 heparin drip discontinued after 48 hours of conservative management per cardiology recommendations. She underwent left-sided thoracentesis .  PT evaluation ordered and  pending.  Assessment & Plan:  Principal Problem:   Acute on chronic respiratory failure with hypoxia and hypercapnia (HCC) Active Problems:   Diabetes mellitus type 2, controlled ([place])   Hyperthyroidism   Hyperkalemia   Acute renal failure superimposed on stage 3a chronic kidney disease (HCC)   Severe pulmonary hypertension (HCC)   Acute on chronic diastolic CHF (congestive heart failure) (HCC)  Acute on chronic hypoxic and hypercapnic respiratory failure. She uses 2 L of oxygen at home at baseline.   She does have chronic hypoxia secondary to CHF, severe pulmonary hypertension, pleural effusion. She presented with desaturation from sleep study center.  She was complaining of dyspnea.   Cardiology was consulted on admission. Echo showed LVEF 60 to 37%, grade 1 diastolic dysfunction, severe pulmonary hypertension with pulmonary artery systolic pressure of 106 mmHg.   She was on milrinone, transitioned to sildenafil now.   VQ scan negative for pulmonary embolism. Continue BiPAP at night.  Continue sildenafil, torsemide. PCCM will arrange follow-up as an outpatient, She is supposed to follow-up with Dr. Silas Flood for her pulmonary hypertension.   No options for discharging her with a CPAP or BiPAP because insurance will not cover.   She needs to finish the sleep study for CPAP. Continue torsemide for diuresis.  She appears euvolemic. She is back to her baseline O2 requirement.  Pleural effusion /small loculated hydropneumothorax: She underwent right thoracentesis multiple times, last one  2/14 with removal of 710 cc of fluid, resulting in small loculated right hydropneumothorax.   Currently respiratory status stable.  Continue conservative management.  Repeat chest x-ray shows improving pneumothorax.  IR is aware.  Elevated Troponin: Recent LHC on 11/20 had shown 30% LAD disease, nonobstructive CAD.Marland Kitchen   No further work-up planned by cardiology.  Currently denies any chest pain.    Continue  Plavix, Coreg, Lipitor.  Not a candidate for cath because of severe pulmonary hypertension.   She was initially started on heparin drip now discontinued after 48 hrs.  Essential Hypertension: Currently blood pressure stable.  Continue current medications.  AKI on CKD stage IIIa : Currently kidney function at baseline.  Her baseline creatinine fluctuates from 1-1.4.    History of cryptogenic stroke.  Continue Plavix, Lipitor  Hyponatremia, mild: > Resolved. Likely secondary to some component of volume overload.  Continue torsemide.  Chronic anemia : Her baseline Hb ranges from 9-10.  Hemoglobin has trended down to 7.3> 8.3 >7.7>7.3 No report of hematochezia or melena.  Continue monitoring  Goals of care: She has multiple hospitalization.  She remains full code.  Continue palliative care consult.  Deconditioning : Prolonged hospitalization.  PT has recommended home health.   Plan to discharge in next 1-2 days if she remains stable.   She has high risk of readmission be due to her significant lung and cardiac problems. Daughter is not comfortable taking her home.      DVT prophylaxis: Heparin     Code Status: Full Code  Family Communication: Spoke with daughter on the phone.  Patient status:Inpatient  Patient is from :Home  Anticipated discharge NA:TFTD  Estimated DC date: 02/22/21   Consultants: Cardiology, palliative care, PCCM  Procedures: Thoracentesis  Antimicrobials:  Anti-infectives (From admission, onward)    None       Subjective: Patient was seen and examined at bedside.  Overnight events noted.  She appears very comfortable, she is now at her baseline oxygen requirement. She does not appear in respiratory distress,  appears euvolemic. She reports feeling very weak and deconditioned.   Objective: Vitals:   02/21/21 0513 02/21/21 0626 02/21/21 0751 02/21/21 0841  BP: (!) 122/49   (!) 133/57  Pulse: 82   93  Resp: 20   16  Temp:  98.6 F (37 C)   98.4 F (36.9 C)  TempSrc:    Oral  SpO2: 99%  100% 100%  Weight:  70.9 kg    Height:        Intake/Output Summary (Last 24 hours) at 02/21/2021 1215 Last data filed at 02/21/2021 1144 Gross per 24 hour  Intake 680 ml  Output 500 ml  Net 180 ml   Filed Weights   02/18/21 0500 02/20/21 0500 02/21/21 0626  Weight: 70.4 kg 72.6 kg 70.9 kg    Examination:  General exam: Appears comfortable, deconditioned, not in any acute distress. HEENT: PERRL Respiratory system: CTA bilaterally, no accessory muscle use, respiratory effort normal. Cardiovascular system: S1-S2 heard, regular rate and rhythm, no murmur. Gastrointestinal system: Abdomen is soft, non tender, non distended, BS+ Central nervous system: Alert and oriented X 2 Extremities: No edema, no clubbing ,no cyanosis Skin: No rashes, no ulcers,no icterus     Data Reviewed: I have personally reviewed following labs and imaging studies  CBC: Recent Labs  Lab 02/16/21 0301 02/17/21 0257 02/18/21 0253 02/18/21 1351 02/20/21 0552 02/21/21 0504  WBC 6.3 6.9 6.5  --  5.6 5.1  NEUTROABS  --   --  4.3  --   --   --   HGB 8.3* 8.3* 7.3* 8.3* 7.7* 7.4*  HCT 26.9* 26.2* 23.2* 26.4* 25.0* 23.5*  MCV 80.1 79.4* 78.9*  --  78.9* 79.1*  PLT 325 370 352  --  417* 322*   Basic Metabolic Panel: Recent Labs  Lab 02/16/21 0301 02/16/21 2327 02/17/21 0257 02/18/21 0253 02/20/21 0254  NA 134* 132* 133* 135 135  K 4.0 3.9 3.8 3.7 3.3*  CL 86* 87* 87* 89* 90*  CO2 40* 38* 38* 38* 39*  GLUCOSE 107* 140* 135* 167* 103*  BUN 40* 46* 45* 49* 41*  CREATININE 1.35* 1.56* 1.45* 1.68* 1.43*  CALCIUM 8.8* 8.7* 8.6* 8.5* 8.7*  MG  --  1.9  --   --  2.0  PHOS  --   --   --   --  3.9     Recent Results (from the past 240 hour(s))  Body fluid culture w Gram Stain     Status: None   Collection Time: 02/11/21  4:24 PM   Specimen: Pleural Fluid  Result Value Ref Range Status   Specimen Description   Final     PLEURAL Performed at [facility] [address] [place], [place] [address]    Special Requests   Final    NONE Performed at [facility], [place] [address] [place], [place] [address]    Gram Stain   Final    NO SQUAMOUS EPITHELIAL CELLS SEEN FEW WBC SEEN NO ORGANISMS SEEN    Culture   Final    NO GROWTH 3 DAYS Performed at [facility], [place] [address], [place], [place] [address]    Report Status 02/15/2021 FINAL  Final  Fungus Culture With Stain     Status: None (Preliminary result)   Collection Time: 02/11/21  4:25 PM   Specimen: Pleural Fluid  Result Value Ref Range Status   Fungus Stain Final report  Final    Comment: (NOTE) Performed At: [facility] [address] [place], [place] [id number] Rush Farmer MD ML:[id number]    Fungus (Mycology) Culture PENDING  Incomplete   Fungal Source PLEURAL  Final    Comment: Performed at [facility], [place] [address], [place], [place] [address]  Fungus Culture Result     Status: None   Collection Time: 02/11/21  4:25 PM  Result Value Ref Range Status   Result 1 Comment  Final    Comment: (NOTE) KOH/Calcofluor preparation:  no fungus observed. Performed At: [facility] [place], [place] [id number] Rush Farmer MD WH:[id number]      Radiology Studies: No results found.  Scheduled Meds:  arformoterol  15 mcg Nebulization BID   atorvastatin  80 mg Oral QPM   carvedilol  12.5 mg Oral BID WC   Chlorhexidine Gluconate Cloth  6 each Topical Daily   clopidogrel  75 mg Oral Daily   fluticasone  1 spray Each Nare Daily   gabapentin  100 mg Oral QHS   heparin injection (subcutaneous)  5,000 Units Subcutaneous Q8H   hydrALAZINE  25 mg Oral Q8H   mouth rinse  15 mL Mouth Rinse BID   melatonin  10 mg Oral QHS   methimazole  2.5 mg Oral Daily   pantoprazole  40 mg Oral q morning   revefenacin  175 mcg Nebulization Daily   sildenafil   20 mg Oral TID   sodium chloride flush  3 mL Intravenous Q12H   torsemide  40 mg Oral Daily   Continuous Infusions:  Time spent : 35 MINS   LOS: 18 days   Shawna Clamp, MD [facility] P2/19/2023, 12:15 PM

## 2021-02-21 NOTE — Plan of Care (Signed)
Mandy [name] sat up in the chair for several hours this afternoon. She does have some DOE but is able to get to bed/chair and transfer to/from [facility] with minimal DOE and a SBA. She has denied any pain and her appetite is robust.  Problem: Education: Goal: Ability to demonstrate management of disease process will improve Outcome: Progressing Goal: Ability to verbalize understanding of medication therapies will improve Outcome: Progressing Goal: Individualized Educational Video(s) Outcome: Progressing   Problem: Activity: Goal: Capacity to carry out activities will improve Outcome: Progressing   Problem: Cardiac: Goal: Ability to achieve and maintain adequate cardiopulmonary perfusion will improve Outcome: Progressing   Problem: Education: Goal: Knowledge of General Education information will improve Description: Including pain rating scale, medication(s)/side effects and non-pharmacologic comfort measures Outcome: Progressing   Problem: Health Behavior/Discharge Planning: Goal: Ability to manage health-related needs will improve Outcome: Progressing   Problem: Clinical Measurements: Goal: Ability to maintain clinical measurements within normal limits will improve Outcome: Progressing Goal: Diagnostic test results will improve Outcome: Progressing Goal: Respiratory complications will improve Outcome: Progressing   Problem: Activity: Goal: Risk for activity intolerance will decrease Outcome: Progressing   Problem: Elimination: Goal: Will not experience complications related to bowel motility Outcome: Progressing

## 2021-02-22 LAB — CBC
HCT: 23.7 % — ABNORMAL LOW (ref 36.0–46.0)
Hemoglobin: 7.5 g/dL — ABNORMAL LOW (ref 12.0–15.0)
MCH: 25.3 pg — ABNORMAL LOW (ref 26.0–34.0)
MCHC: 31.6 g/dL (ref 30.0–36.0)
MCV: 79.8 fL — ABNORMAL LOW (ref 80.0–100.0)
Platelets: 411 10*3/uL — ABNORMAL HIGH (ref 150–400)
RBC: 2.97 MIL/uL — ABNORMAL LOW (ref 3.87–5.11)
RDW: 15.9 % — ABNORMAL HIGH (ref 11.5–15.5)
WBC: 5.2 10*3/uL (ref 4.0–10.5)
nRBC: 0 % (ref 0.0–0.2)

## 2021-02-22 LAB — PHOSPHORUS: Phosphorus: 4.3 mg/dL (ref 2.5–4.6)

## 2021-02-22 LAB — GLUCOSE, CAPILLARY
Glucose-Capillary: 112 mg/dL — ABNORMAL HIGH (ref 70–99)
Glucose-Capillary: 129 mg/dL — ABNORMAL HIGH (ref 70–99)
Glucose-Capillary: 173 mg/dL — ABNORMAL HIGH (ref 70–99)

## 2021-02-22 LAB — COOXEMETRY PANEL
Carboxyhemoglobin: 1.3 % (ref 0.5–1.5)
Methemoglobin: 1.3 % (ref 0.0–1.5)
O2 Saturation: 96.5 %
Total hemoglobin: 7.6 g/dL — ABNORMAL LOW (ref 12.0–16.0)

## 2021-02-22 LAB — MAGNESIUM: Magnesium: 2.1 mg/dL (ref 1.7–2.4)

## 2021-02-22 LAB — BASIC METABOLIC PANEL
Anion gap: 9 (ref 5–15)
BUN: 46 mg/dL — ABNORMAL HIGH (ref 8–23)
CO2: 35 mmol/L — ABNORMAL HIGH (ref 22–32)
Calcium: 8.6 mg/dL — ABNORMAL LOW (ref 8.9–10.3)
Chloride: 90 mmol/L — ABNORMAL LOW (ref 98–111)
Creatinine, Ser: 1.67 mg/dL — ABNORMAL HIGH (ref 0.44–1.00)
GFR, Estimated: 31 mL/min — ABNORMAL LOW (ref >60)
Glucose, Bld: 118 mg/dL — ABNORMAL HIGH (ref 70–99)
Potassium: 3.7 mmol/L (ref 3.5–5.1)
Sodium: 134 mmol/L — ABNORMAL LOW (ref 135–145)

## 2021-02-22 MED ORDER — HYDRALAZINE HCL 25 MG PO TABS: 25.0000 mg | ORAL_TABLET | Freq: Three times a day (TID) | ORAL | 1 refills | Status: DC

## 2021-02-22 MED ORDER — ATORVASTATIN CALCIUM 80 MG PO TABS
80.0000 mg | ORAL_TABLET | Freq: Every evening | ORAL | 1 refills | Status: AC
Start: 2021-02-22 — End: ?

## 2021-02-22 MED ORDER — TORSEMIDE 40 MG PO TABS: 40.0000 mg | ORAL_TABLET | Freq: Every day | ORAL | 1 refills | Status: DC

## 2021-02-22 MED ORDER — FLUTICASONE PROPIONATE 50 MCG/ACT NA SUSP: 1.0000 | Freq: Every day | NASAL | 2 refills | Status: AC

## 2021-02-22 MED ORDER — SILDENAFIL CITRATE 20 MG PO TABS: 20.0000 mg | ORAL_TABLET | Freq: Three times a day (TID) | ORAL | 0 refills | Status: DC

## 2021-02-22 MED ORDER — CARVEDILOL 12.5 MG PO TABS: 12.5000 mg | ORAL_TABLET | Freq: Two times a day (BID) | ORAL | 1 refills | Status: DC

## 2021-02-22 NOTE — Discharge Instructions (Addendum)
Advised to follow-up with primary care physician in 1 week. Advised to continue Coreg, torsemide, hydralazine, Plavix, Lipitor as prescribed. Advised to continue supplemental oxygen . Advised to follow-up with pulmonology for sleep studies

## 2021-02-22 NOTE — TOC Transition Note (Signed)
Transition of Care [facility]) - CM/SW Discharge Note   Patient Details  Name: Mandy [name] MRN: [id number] Date of Birth: February 04, 1940  Transition of Care [facility]) CM/SW Contact:  Dessa Phi, RN Phone Number: 02/22/2021, 1:01 PM   Clinical Narrative: spoke to dtr Mandy [name]-agree to d/c with [facility] already aware;[employer] rep Danielle aware of hospital to be delivered to home(doesn't have to be there prior patient coming home) dtr to transport home on own. Dtr will bring home 02 travel tank. No further CM needs.      Final next level of care: [place] Barriers to Discharge: No Barriers Identified   Patient Goals and CMS Choice Patient states their goals for this hospitalization and ongoing recovery are:: go home CMS Medicare.gov Compare Post Acute Care list provided to:: Patient Represenative (must comment) Mandy [name] dtr [phone]) Choice offered to / list presented to : Adult Children  Discharge Placement                       Discharge Plan and Services   Discharge Planning Services: CM Consult Post Acute Care Choice: NA                               Social Determinants of Health (SDOH) Interventions     Readmission Risk Interventions Readmission Risk Prevention Plan 02/05/2021 11/06/2020  Transportation Screening Complete Complete  PCP or Specialist Appt within 3-5 Days - Complete  HRI or [place] - Complete  Social Work Consult for [place] Planning/Counseling - Complete  Palliative Care Screening - Complete  Medication Review Press photographer) Complete Complete  PCP or Specialist appointment within 3-5 days of discharge Complete -  [place] or Home Care Consult Complete -  SW Recovery Care/Counseling Consult Complete -  Palliative Care Screening Complete -  [place] Not Applicable -  Some recent data might be hidden

## 2021-02-22 NOTE — Progress Notes (Signed)
D/c instructions reviewed w/ pt dtr, Burman Freestone, at pt request. Tonette Lederer understanding and all questions answered. Dtr in possession of d/c packet and all personal belongings. Dtr has pt's portable O2 tank for ride home. Pt d/c in w/c in stable condtion to dtr's car.

## 2021-02-22 NOTE — Care Management Important Message (Signed)
Important Message  Patient Details IM Letter placed in Patients room for Daughter Name: Mandy [name] MRN: [id number] Date of Birth: 04-Feb-1940   Medicare Important Message Given:  Yes     Kerin Salen 02/22/2021, 2:49 PM

## 2021-02-22 NOTE — Discharge Summary (Signed)
Physician Discharge Summary  Mandy [name] ZCH:[id number] DOB: May 27, 1940 DOA: 02/02/2021  PCP: [name], MD  Admit date: 02/02/2021  Discharge date: 02/22/2021  Admitted From: Home  Disposition:  [facility] services.  Recommendations for Outpatient Follow-up:  Follow up with PCP in 1-2 weeks Please obtain BMP/CBC in one week Advised to continue Coreg, torsemide, hydralazine, Plavix, Lipitor as prescribed. Advised to continue supplemental oxygen . Advised to follow-up with pulmonology for sleep studies  Home Health: Home Health PT/OT Equipment/Devices:Home oxygen  Discharge Condition: Stable CODE STATUS:Full code Diet recommendation: Heart Healthy    Brief Summary / Hospital Course: This 81 year old female with PMH significant for severe pulmonary hypertension, hyperlipidemia, diabetes type 2, chronic pleural effusion, goiter, hypothyroidism, chronic diastolic CHF, chronic hypoxic/hypercarbic respiratory failure on 2 L of oxygen at home who presented to the emergency department on 02/03/2021 from sleep center after her saturation dropped to 30% for 10 minutes. Patient also reporting shortness of breath for last 2 to 3 weeks before admission.  On presentation, chest x-ray showed cardiomegaly with moderate pulmonary edema, moderate right-sided pleural effusion, stable small left-sided pleural effusion. Patient was admitted for the management of acute on chronic hypoxic/hypercapnic respiratory failure secondary to CHF exacerbation, pulm hypertension.  Cardiology was also consulted after admission, started on IV diuresis.  Hospital course remarkable for worsening dyspnea not responding to BiPAP and she had to be transferred to ICU for close observation.  Her overall condition has improved.  Cardiology have signed off.  PT/OT recommending home health.  Patient is being discharged home.  She was managed for below problems.    Discharge Diagnoses:  Principal Problem:   Acute on chronic  respiratory failure with hypoxia and hypercapnia (HCC) Active Problems:   Diabetes mellitus type 2, controlled ([place])   Hyperthyroidism   Hyperkalemia   Acute renal failure superimposed on stage 3a chronic kidney disease (HCC)   Severe pulmonary hypertension (HCC)   Acute on chronic diastolic CHF (congestive heart failure) (HCC)  Acute on chronic hypoxic and hypercapnic respiratory failure. She uses 2 L of oxygen at home at baseline.   She does have chronic hypoxia secondary to CHF, severe pulmonary hypertension, pleural effusion. She presented with desaturation from sleep study center.  She was complaining of dyspnea.   Cardiology was consulted on admission. Echo showed LVEF 60 to 28%, grade 1 diastolic dysfunction, severe pulmonary hypertension with pulmonary artery systolic pressure of 786 mmHg.   She was on milrinone, transitioned to sildenafil now.   VQ scan negative for pulmonary embolism. Continue BiPAP at night.  Continue sildenafil, torsemide. PCCM will arrange follow-up as an outpatient, She is supposed to follow-up with Dr. Silas Flood for her pulmonary hypertension.   No options for discharging her with a CPAP or BiPAP because insurance will not cover.   She needs to finish the sleep study for CPAP. Continue torsemide for diuresis.  She appears euvolemic. She is back to her baseline O2 requirement.   Pleural effusion /small loculated hydropneumothorax: She underwent right thoracentesis multiple times, last one  2/14 with removal of 710 cc of fluid, resulting in small loculated right hydropneumothorax.   Currently respiratory status stable.  Continue conservative management.  Repeat chest x-ray shows improving pneumothorax.  IR is aware.   Elevated Troponin: Recent LHC on 11/20 had shown 30% LAD disease, nonobstructive CAD.Marland Kitchen   No further work-up planned by cardiology.  Currently denies any chest pain.   Continue  Plavix, Coreg, Lipitor.  Not a candidate for cath because of severe  pulmonary hypertension.   She was initially started on heparin drip now discontinued after 48 hrs.   Essential Hypertension: Currently blood pressure stable.  Continue current medications.   AKI on CKD stage IIIa : Currently kidney function at baseline.  Her baseline creatinine fluctuates from 1-1.4.     History of cryptogenic stroke.  Continue Plavix, Lipitor   Hyponatremia, mild: > Resolved. Likely secondary to some component of volume overload.  Continue torsemide.   Chronic anemia : Her baseline Hb ranges from 9-10.  Hemoglobin has trended down to 7.3> 8.3 >7.7>7.3 No report of hematochezia or melena.  Continue monitoring   Goals of care: She has multiple hospitalization.  She remains full code.  Continue palliative care consult.   Deconditioning : Prolonged hospitalization.  PT has recommended home health.   Plan to discharge in next 1-2 days if she remains stable.   She has high risk of readmission be due to her significant lung and cardiac problems.  Discharge Instructions  Discharge Instructions     Call MD for:  difficulty breathing, headache or visual disturbances   Complete by: As directed    Call MD for:  persistant dizziness or light-headedness   Complete by: As directed    Call MD for:  persistant nausea and vomiting   Complete by: As directed    Diet - low sodium heart healthy   Complete by: As directed    Diet Carb Modified   Complete by: As directed    Discharge instructions   Complete by: As directed    Advised to follow-up with primary care physician in 1 week. Advised to continue Coreg, torsemide, hydralazine, Plavix, Lipitor as prescribed. Advised to continue supplemental oxygen . Advised to follow-up with pulmonology for sleep studies   Increase activity slowly   Complete by: As directed       Allergies as of 02/22/2021   No Known Allergies      Medication List     STOP taking these medications    atenolol 50 MG tablet Commonly known  as: TENORMIN   diazepam 2 MG tablet Commonly known as: VALIUM   simvastatin 40 MG tablet Commonly known as: ZOCOR       TAKE these medications    acetaminophen 500 MG tablet Commonly known as: TYLENOL Take 1,000 mg by mouth every 6 (six) hours as needed for fever or headache (pain).   albuterol (2.5 MG/3ML) 0.083% nebulizer solution Commonly known as: PROVENTIL Take 2.5 mg by nebulization every 6 (six) hours as needed for wheezing or shortness of breath.   albuterol 108 (90 Base) MCG/ACT inhaler Commonly known as: VENTOLIN HFA Inhale 2 puffs into the lungs every 6 (six) hours as needed for wheezing or shortness of breath.   alendronate 70 MG tablet Commonly known as: FOSAMAX Take 70 mg by mouth every Tuesday. Take with a full glass of water on an empty stomach.   atorvastatin 80 MG tablet Commonly known as: LIPITOR Take 1 tablet (80 mg total) by mouth every evening.   calcium carbonate 1500 (600 Ca) MG Tabs tablet Commonly known as: OSCAL Take 600 mg of elemental calcium by mouth 2 (two) times daily with a meal.   carvedilol 12.5 MG tablet Commonly known as: COREG Take 1 tablet (12.5 mg total) by mouth 2 (two) times daily with a meal.   cetirizine 10 MG tablet Commonly known as: ZYRTEC Take 10 mg by mouth daily.   clopidogrel 75 MG tablet Commonly known as: PLAVIX Take  1 tablet (75 mg total) by mouth daily.   diclofenac sodium 1 % Gel Commonly known as: VOLTAREN Apply 4 g topically 4 (four) times daily as needed for pain. shoulder   FISH OIL PO Take 1 capsule by mouth every morning.   fluticasone 50 MCG/ACT nasal spray Commonly known as: FLONASE Place 1 spray into both nostrils daily. Start taking on: February 23, 2021   gabapentin 100 MG capsule Commonly known as: NEURONTIN Take 100 mg by mouth at bedtime.   hydrALAZINE 25 MG tablet Commonly known as: APRESOLINE Take 1 tablet (25 mg total) by mouth every 8 (eight) hours. What changed:  medication  strength how much to take when to take this   melatonin 5 MG Tabs Take 10 mg by mouth at bedtime.   methimazole 5 MG tablet Commonly known as: TAPAZOLE Take 0.5 tablets (2.5 mg total) by mouth daily.   multivitamin with minerals Tabs tablet Take 1 tablet by mouth every morning.   OXYGEN Inhale 4 L into the lungs continuous.   pantoprazole 40 MG tablet Commonly known as: PROTONIX Take 1 tablet (40 mg total) by mouth daily at 6 (six) AM. What changed: when to take this   potassium chloride SA 20 MEQ tablet Commonly known as: KLOR-CON M Take 1 tablet (20 mEq total) by mouth daily.   sildenafil 20 MG tablet Commonly known as: REVATIO Take 1 tablet (20 mg total) by mouth 3 (three) times daily.   Torsemide 40 MG Tabs Take 40 mg by mouth daily. Start taking on: February 23, 2021 What changed: when to take this   vitamin B-12 1000 MCG tablet Commonly known as: CYANOCOBALAMIN Take 1,000 mcg by mouth daily.   vitamin C 500 MG tablet Commonly known as: ASCORBIC ACID Take 500 mg by mouth daily.   Vitamin D (Cholecalciferol) 25 MCG (1000 UT) Tabs Take 1,000 Units by mouth daily.   zinc gluconate 50 MG tablet Take 50 mg by mouth daily.               Durable Medical Equipment  (From admission, onward)           Start     Ordered   02/11/21 1437  For home use only DME Hospital bed  Once       Question Answer Comment  Length of Need Lifetime   The above medical condition requires: Patient requires the ability to reposition frequently   Head must be elevated greater than: 30 degrees   Bed type Semi-electric   Support Surface: Gel Overlay      02/11/21 1437            Follow-up Information     [name], MD Follow up in 1 week(s).   Specialty: Internal Medicine Contact information: [place] [address] [phone]         Donato Heinz, MD .   Specialties: Cardiology, Radiology Contact information: [address] [place] [address] [place], [facility] Follow up.   Specialty: [place] Why: [facility] nursing/physical therapy/occupational therapy/aide/social worker Contact information: [place] [address] [phone]         [facility] Follow up.   Why: hospital bed Contact information: [place] [address] [phone]                No Known Allergies  Consultations: Cardiology  Pulmonology    Procedures/Studies: DG Chest 1 View  Result Date: 02/16/2021 CLINICAL DATA:  Status post thoracentesis EXAM: CHEST  1 VIEW COMPARISON:  02/14/2011 FINDINGS: Numerous leads and wires project over the chest. Midline trachea. Cardiomegaly accentuated by AP portable technique. Atherosclerosis in the transverse aorta. Small inferolateral right-sided pneumothorax, with visceral pleural line on the order of 11 mm from the chest wall. Trace apical component as well. Decreased small right pleural effusion. Persistent small left pleural effusion. Mild interstitial edema. Improved right and persistent left base airspace disease. IMPRESSION: Small right-sided pneumothorax. Decreased right-sided pleural fluid and adjacent atelectasis. Persistent small left pleural effusion with adjacent atelectasis. Cardiomegaly with mild interstitial edema. These results will be called to the ordering clinician or representative by the Radiologist Assistant, and communication documented in the PACS or [employer]. Electronically Signed   By: Abigail Miyamoto M.D.   On: 02/16/2021 16:49   NM Pulmonary Perfusion  Result Date: 02/12/2021 CLINICAL DATA:  Short of breath, concern for pulmonary embolism. EXAM: NUCLEAR MEDICINE PERFUSION LUNG SCAN TECHNIQUE: Perfusion images were obtained in multiple projections after intravenous injection of radiopharmaceutical. RADIOPHARMACEUTICALS:  4.4 mCi Tc-71m MAA  COMPARISON:  Chest radiograph 02/11/2021 CT chest 12/14/2020 FINDINGS: Decreased regional perfusion to the RIGHT lower lobe corresponds to loculated effusion on comparison radiograph and CT. No wedge-shaped peripheral perfusion defects to localize acute pulmonary embolism. IMPRESSION: 1. No evidence acute pulmonary embolism. 2. Decreased perfusion RIGHT lower lobe related to large loculated pleural effusion. Electronically Signed   By: Suzy Bouchard M.D.   On: 02/12/2021 14:20   Cardiac event monitor  Result Date: 01/26/2021 Sinus rhythm No AV block or pauses No afib  DG Chest Port 1 View  Result Date: 02/18/2021 CLINICAL DATA:  Right-sided pneumothorax EXAM: PORTABLE CHEST 1 VIEW COMPARISON:  Yesterday FINDINGS: Numerous leads and wires project over the chest. Patient rotated right. Cardiomegaly accentuated by AP portable technique. Small loculated right-sided hydropneumothorax inferiorly and laterally is minimally decreased. Small left pleural effusion persists. Mild interstitial edema is slightly improved. Persistent bibasilar airspace disease IMPRESSION: Slight decrease in small inferolateral right hydropneumothorax. Improvement in congestive heart failure with persistent small left pleural effusion and bibasilar atelectasis. Electronically Signed   By: Abigail Miyamoto M.D.   On: 02/18/2021 08:17   DG Chest Port 1 View  Result Date: 02/17/2021 CLINICAL DATA:  Right-sided pneumothorax. EXAM: PORTABLE CHEST 1 VIEW COMPARISON:  02/16/2021 and CT chest 12/14/2020. FINDINGS: Trachea is midline. Heart is enlarged. Mixed interstitial and airspace opacification bilaterally, worsened since 02/16/2021. Small amount of loculated air at the lateral base of the right hemithorax appears similar. Bilateral pleural effusions. IMPRESSION: 1. Small loculated right hydropneumothorax. 2. Congestive heart failure. Electronically Signed   By: Lorin Picket M.D.   On: 02/17/2021 08:36   DG Chest Port 1 View  Result  Date: 02/16/2021 CLINICAL DATA:  Right-sided pneumothorax. EXAM: PORTABLE CHEST 1 VIEW COMPARISON:  Chest radiograph dated 02/16/2021. FINDINGS: Slight interval increase in the right basilar pneumothorax since the earlier radiograph. There is cardiomegaly with vascular congestion and edema. Small bilateral pleural effusions. Left lung base atelectasis or infiltrate. Atherosclerotic calcification of the aorta. Degenerative changes of spine. No acute osseous pathology. IMPRESSION: 1. Slight interval increase in the right basilar pneumothorax since the earlier radiograph. 2. Cardiomegaly with vascular congestion and edema. 3. Small bilateral pleural effusions. Electronically Signed   By: Anner Crete M.D.   On: 02/16/2021 21:45   DG CHEST PORT 1 VIEW  Result Date: 02/13/2021 CLINICAL DATA:  Increased shortness of breath. EXAM: PORTABLE CHEST 1 VIEW COMPARISON:  02/11/2021 FINDINGS: Unchanged cardiomediastinal contours. No significant change in the appearance of bilateral pleural effusions with mild diffuse interstitial edema. Areas of atelectasis are noted bilaterally, left greater than right. IMPRESSION: No change in aeration to the lungs compared with previous exam. Electronically Signed   By: Kerby Moors M.D.   On: 02/13/2021 16:27   DG CHEST PORT 1 VIEW  Result Date: 02/11/2021 CLINICAL DATA:  Status post thoracentesis EXAM: PORTABLE CHEST 1 VIEW COMPARISON:  02/06/2021 FINDINGS: Right arm PICC tip in the SVC above the right atrium. Artifact overlies the chest. There are bilateral pleural effusions with dependent pulmonary atelectasis. Effusion on the right appears similar to the study of 5 days ago. Effusion on the left is larger. No sign of pneumothorax post procedure. No acute bone finding. IMPRESSION: No pneumothorax post procedure. Bilateral pleural effusions with dependent atelectasis. Effusion on the right is similar to the study of 5 days ago. Effusion on the left is larger. Electronically  Signed   By: Nelson Chimes M.D.   On: 02/11/2021 16:55   DG CHEST PORT 1 VIEW  Result Date: 02/06/2021 CLINICAL DATA:  Difficulty breathing EXAM: PORTABLE CHEST 1 VIEW COMPARISON:  Previous studies including the examination of 02/05/2021 FINDINGS: Transverse diameter of heart is increased. Central pulmonary vessels are prominent. Moderate to large right pleural effusion is seen. Small left pleural effusion is present. There is some improvement in aeration of left lower lung fields. Tip of PICC line is seen in the superior vena cava close to right atrium. IMPRESSION: Cardiomegaly. CHF. Moderate to large right pleural effusion. Possibility of underlying infiltrates in the right lower lung fields is not excluded. There is interval improvement in aeration of left lower lung fields suggesting decrease in the left pleural effusion. Electronically Signed   By: Elmer Picker M.D.   On: 02/06/2021 10:57   DG CHEST PORT 1 VIEW  Result Date: 02/05/2021 CLINICAL DATA:  Pulmonary edema EXAM: PORTABLE CHEST 1 VIEW COMPARISON:  02/03/2021 FINDINGS: Unchanged cardiomegaly. Diffuse interstitial and lower lung predominant alveolar opacities, not simply changed from prior. Moderate right and small left pleural effusions, unchanged. Unchanged bibasilar opacities. No visible pneumothorax. Bilateral shoulder osteoarthritis. No acute osseous abnormality. IMPRESSION: Unchanged moderate pulmonary edema, moderate right and small left pleural effusions with adjacent basilar opacities. Electronically Signed   By: Maurine Simmering M.D.   On: 02/05/2021 09:04   DG Chest Port 1 View  Result Date: 02/03/2021 CLINICAL DATA:  Shortness of breath. EXAM: PORTABLE CHEST 1 VIEW COMPARISON:  Chest x-ray 12/15/2020. FINDINGS: The heart is enlarged, unchanged. There is central pulmonary vascular congestion with perihilar opacities. Moderate right pleural effusion has increased. Small left pleural effusion is stable. There is no pneumothorax or  acute fracture. IMPRESSION: 1. Cardiomegaly with moderate pulmonary edema. 2. Moderate right pleural effusion has increased. Small left pleural effusion is stable. Electronically Signed   By: Ronney Asters M.D.   On: 02/03/2021 01:06   ECHOCARDIOGRAM COMPLETE  Result Date: 02/05/2021    ECHOCARDIOGRAM REPORT   Patient Name:   Mandy [name] Date of Exam: 02/05/2021 Medical Rec #:  [id number]      Height:       59.0 in Accession #:    [id number]     Weight:       162.7 lb Date of Birth:  December 27, 1940       BSA:          1.689 m  Patient Age:    28 years       BP:           188/80 mmHg Patient Gender: F              HR:           65 bpm. Exam Location:  Inpatient Procedure: 2D Echo Indications:    Pulmonary hypertension  History:        Patient has prior history of Echocardiogram examinations, most                 recent 12/15/2020. CHF, Pulmonary HTN; Risk Factors:Diabetes.  Sonographer:    Jefferey Pica Referring Phys: [place]  1. Severe pulmonary hypertension estimated PA systolic 657 mmHg.  2. Left ventricular ejection fraction, by estimation, is 60 to 65%. The left ventricle has normal function. The left ventricle has no regional wall motion abnormalities. There is severe left ventricular hypertrophy. Left ventricular diastolic parameters  are consistent with Grade I diastolic dysfunction (impaired relaxation).  3. Right ventricular systolic function is moderately reduced. The right ventricular size is moderately enlarged. There is severely elevated pulmonary artery systolic pressure.  4. The pericardial effusion is posterior to the left ventricle.  5. The mitral valve is abnormal. Trivial mitral valve regurgitation. No evidence of mitral stenosis.  6. Tricuspid valve regurgitation is severe.  7. The aortic valve is tricuspid. There is mild calcification of the aortic valve. Aortic valve regurgitation is not visualized. Aortic valve sclerosis is present, with no evidence of aortic valve  stenosis.  8. The inferior vena cava is dilated in size with <50% respiratory variability, suggesting right atrial pressure of 15 mmHg. FINDINGS  Left Ventricle: Left ventricular ejection fraction, by estimation, is 60 to 65%. The left ventricle has normal function. The left ventricle has no regional wall motion abnormalities. The left ventricular internal cavity size was normal in size. There is  severe left ventricular hypertrophy. Left ventricular diastolic parameters are consistent with Grade I diastolic dysfunction (impaired relaxation). Right Ventricle: The right ventricular size is moderately enlarged. No increase in right ventricular wall thickness. Right ventricular systolic function is moderately reduced. There is severely elevated pulmonary artery systolic pressure. The tricuspid regurgitant velocity is 4.61 m/s, and with an assumed right atrial pressure of 15 mmHg, the estimated right ventricular systolic pressure is 846.9 mmHg. Left Atrium: Left atrial size was normal in size. Right Atrium: Right atrial size was normal in size. Pericardium: Trivial pericardial effusion is present. The pericardial effusion is posterior to the left ventricle. Mitral Valve: The mitral valve is abnormal. There is mild thickening of the mitral valve leaflet(s). There is mild calcification of the mitral valve leaflet(s). Trivial mitral valve regurgitation. No evidence of mitral valve stenosis. Tricuspid Valve: The tricuspid valve is normal in structure. Tricuspid valve regurgitation is severe. No evidence of tricuspid stenosis. Aortic Valve: The aortic valve is tricuspid. There is mild calcification of the aortic valve. Aortic valve regurgitation is not visualized. Aortic valve sclerosis is present, with no evidence of aortic valve stenosis. Aortic valve peak gradient measures 10.5 mmHg. Pulmonic Valve: The pulmonic valve was normal in structure. Pulmonic valve regurgitation is mild. No evidence of pulmonic stenosis. Aorta:  The aortic root is normal in size and structure. Venous: The inferior vena cava is dilated in size with less than 50% respiratory variability, suggesting right atrial pressure of 15 mmHg. IAS/Shunts: No atrial level shunt detected by color flow Doppler. Additional Comments: Severe  pulmonary hypertension estimated PA systolic 485 mmHg. There is a small pleural effusion in the left lateral region.  LEFT VENTRICLE PLAX 2D LVIDd:         4.20 cm   Diastology LVIDs:         2.30 cm   LV e' medial:    4.11 cm/s LV PW:         1.60 cm   LV E/e' medial:  13.6 LV IVS:        1.70 cm   LV e' lateral:   5.18 cm/s LVOT diam:     2.00 cm   LV E/e' lateral: 10.8 LV SV:         90 LV SV Index:   53 LVOT Area:     3.14 cm  RIGHT VENTRICLE            IVC RV Basal diam:  3.30 cm    IVC diam: 2.60 cm RV Mid diam:    3.60 cm RV S prime:     8.05 cm/s TAPSE (M-mode): 1.8 cm LEFT ATRIUM             Index        RIGHT ATRIUM           Index LA diam:        3.50 cm 2.07 cm/m   RA Area:     21.30 cm LA Vol (A2C):   67.5 ml 39.95 ml/m  RA Volume:   65.90 ml  39.01 ml/m LA Vol (A4C):   37.8 ml 22.37 ml/m LA Biplane Vol: 51.3 ml 30.36 ml/m  AORTIC VALVE                 PULMONIC VALVE AV Area (Vmax): 2.31 cm     PV Vmax:       0.75 m/s AV Vmax:        162.00 cm/s  PV Peak grad:  2.2 mmHg AV Peak Grad:   10.5 mmHg LVOT Vmax:      119.00 cm/s LVOT Vmean:     66.500 cm/s LVOT VTI:       0.285 m  AORTA Ao Root diam: 3.40 cm Ao Asc diam:  3.60 cm MITRAL VALVE                TRICUSPID VALVE MV Area (PHT): 3.60 cm     TR Peak grad:   85.0 mmHg MV Decel Time: 211 msec     TR Vmax:        461.00 cm/s MV E velocity: 55.70 cm/s MV A velocity: 100.00 cm/s  SHUNTS MV E/A ratio:  0.56         Systemic VTI:  0.29 m                             Systemic Diam: 2.00 cm Jenkins Rouge MD Electronically signed by Jenkins Rouge MD Signature Date/Time: 02/05/2021/12:04:34 PM    Final    ECHOCARDIOGRAM LIMITED  Result Date: 02/14/2021    ECHOCARDIOGRAM LIMITED  REPORT   Patient Name:   Mandy [name] Date of Exam: 02/14/2021 Medical Rec #:  [id number]      Height:       59.0 in Accession #:    [id number]     Weight:       154.8 lb Date of Birth:  09-Jan-1940       BSA:  1.654 m Patient Age:    81 years       BP:           139/65 mmHg Patient Gender: F              HR:           86 bpm. Exam Location:  Inpatient Procedure: Limited Echo, 3D Echo, Cardiac Doppler and Color Doppler Indications:    122-I22.9 Subsequent ST elevation (STEM) and non-ST elevation                 (NSTEMI) myocardial infarction  History:        Patient has prior history of Echocardiogram examinations, most                 recent 02/05/2021. CHF, Pulmonary HTN and Stroke,                 Signs/Symptoms:Chest Pain, Dizziness/Lightheadedness, Shortness                 of Breath and Dyspnea; Risk Factors:Diabetes, Hypertension and                 Dyslipidemia.  Sonographer:    Roseanna Rainbow RDCS Referring Phys: [id number] Freada Bergeron  Sonographer Comments: Patient is morbidly obese. Image acquisition challenging due to patient body habitus. IMPRESSIONS  1. Left ventricular ejection fraction, by estimation, is 55 to 60%. Left ventricular ejection fraction by 3D volume is 55 %. The left ventricle has normal function. The left ventricle has no regional wall motion abnormalities. There is moderate concentric left ventricular hypertrophy.  2. Right ventricular systolic function was not well visualized. The right ventricular size is normal. There is severely elevated pulmonary artery systolic pressure. The estimated right ventricular systolic pressure is 64.3 mmHg.  3. The pericardial effusion is circumferential. Moderate pleural effusion in the left lateral region.  4. The mitral valve is normal in structure. Trivial mitral valve regurgitation. No evidence of mitral stenosis.  5. Tricuspid valve regurgitation is moderate.  6. The aortic valve is normal in structure. Aortic valve regurgitation is not  visualized. No aortic stenosis is present.  7. The inferior vena cava is normal in size with greater than 50% respiratory variability, suggesting right atrial pressure of 3 mmHg.  8. Compared to study dated 02/05/21,the IVC is no longer dilated and has normal respiratory collapse > 50%. TR is moderate and PASP has decreased from 19mmHg to 72mmHg. FINDINGS  Left Ventricle: Left ventricular ejection fraction, by estimation, is 55 to 60%. Left ventricular ejection fraction by 3D volume is 55 %. The left ventricle has normal function. The left ventricle has no regional wall motion abnormalities. The left ventricular internal cavity size was normal in size. There is moderate concentric left ventricular hypertrophy. Right Ventricle: The right ventricular size is normal. No increase in right ventricular wall thickness. Right ventricular systolic function was not well visualized. There is severely elevated pulmonary artery systolic pressure. The tricuspid regurgitant velocity is 3.82 m/s, and with an assumed right atrial pressure of 3 mmHg, the estimated right ventricular systolic pressure is 32.9 mmHg. Left Atrium: Left atrial size was normal in size. Right Atrium: Right atrial size was normal in size. Pericardium: Trivial pericardial effusion is present. The pericardial effusion is circumferential. Mitral Valve: The mitral valve is normal in structure. Trivial mitral valve regurgitation. No evidence of mitral valve stenosis. Tricuspid Valve: The tricuspid valve is normal in structure. Tricuspid valve regurgitation is moderate .  No evidence of tricuspid stenosis. Aortic Valve: The aortic valve is normal in structure. Aortic valve regurgitation is not visualized. No aortic stenosis is present. Pulmonic Valve: The pulmonic valve was normal in structure. Pulmonic valve regurgitation is trivial. No evidence of pulmonic stenosis. Aorta: The aortic root is normal in size and structure. Venous: The inferior vena cava is normal in  size with greater than 50% respiratory variability, suggesting right atrial pressure of 3 mmHg. IAS/Shunts: No atrial level shunt detected by color flow Doppler. Additional Comments: There is a moderate pleural effusion in the left lateral region. LEFT VENTRICLE PLAX 2D LVIDd:         4.10 cm LVIDs:         2.70 cm LV PW:         1.30 cm         3D Volume EF LV IVS:        1.50 cm         LV 3D EF:    Left                                             ventricul                                             ar LV Volumes (MOD)                            ejection LV vol d, MOD    67.2 ml                    fraction A2C:                                        by 3D LV vol d, MOD    62.7 ml                    volume is A4C:                                        55 %. LV vol s, MOD    32.2 ml A2C: LV vol s, MOD    25.6 ml       3D Volume EF: A4C:                           3D EF:        55 % LV SV MOD A2C:   35.0 ml       LV EDV:       98 ml LV SV MOD A4C:   62.7 ml       LV ESV:       45 ml LV SV MOD BP:    35.7 ml       LV SV:        54 ml IVC IVC diam: 1.70 cm LEFT ATRIUM         Index LA diam:  3.30 cm 2.00 cm/m                        PULMONIC VALVE AORTA                 PR End Diast Vel: 1.96 msec Ao Root diam: 3.30 cm Ao Asc diam:  3.50 cm TRICUSPID VALVE TR Peak grad:   58.4 mmHg TR Vmax:        382.00 cm/s Fransico Him MD Electronically signed by Fransico Him MD Signature Date/Time: 02/14/2021/2:59:34 PM    Final    [place] EKG SITE RITE  Result Date: 02/05/2021 If Site Rite image not attached, placement could not be confirmed due to current cardiac rhythm.  SLEEP STUDY DOCUMENTS  Result Date: 02/03/2021 Ordered by an unspecified provider.  US THORACENTESIS ASP PLEURAL SPACE W/IMG GUIDE  Result Date: 02/16/2021 INDICATION: Patient with history of pulmonary hypertension, CHF, non STEMI, coronary artery disease, chronic kidney disease, recurrent pleural effusions. Request received for therapeutic right  thoracentesis. EXAM: ULTRASOUND GUIDED THERAPEUTIC RIGHT THORACENTESIS MEDICATIONS: 10 mL 1% lidocaine COMPLICATIONS: None immediate. PROCEDURE: An ultrasound guided thoracentesis was thoroughly discussed with the patient/daughter and questions answered. The benefits, risks, alternatives and complications were also discussed. The patient understands and wishes to proceed with the procedure. Written consent was obtained. Ultrasound was performed to localize and mark an adequate pocket of fluid in the right chest. The area was then prepped and draped in the normal sterile fashion. 1% Lidocaine was used for local anesthesia. Under ultrasound guidance a 6 Fr Safe-T-Centesis catheter was introduced. Thoracentesis was performed. The catheter was removed and a dressing applied. FINDINGS: A total of approximately 710 cc of hazy, amber fluid was removed. IMPRESSION: Successful ultrasound guided therapeutic right thoracentesis yielding 710 cc of pleural fluid. Read by: Rowe Robert, PA-C Electronically Signed   By: Lucrezia Europe M.D.   On: 02/16/2021 16:51     Subjective: Patient was seen and examined at bedside.  Overnight events noted.   Patient reports feeling much improved.  Patient is being discharged home, [facility] services been arranged.  Discharge Exam: Vitals:   02/22/21 0804 02/22/21 1232  BP:  (!) 118/54  Pulse:  83  Resp:  19  Temp:  98 F (36.7 C)  SpO2: 99% 100%   Vitals:   02/22/21 0511 02/22/21 0751 02/22/21 0804 02/22/21 1232  BP: (!) 139/58 (!) 130/55  (!) 118/54  Pulse: 79 85  83  Resp: 18 16  19   Temp: 98.7 F (37.1 C) 98.1 F (36.7 C)  98 F (36.7 C)  TempSrc: Oral Oral  Oral  SpO2: 100% 100% 99% 100%  Weight: 71.6 kg     Height:        General: Pt is alert, awake, not in acute distress Cardiovascular: RRR, S1/S2 +, no rubs, no gallops Respiratory: CTA bilaterally, no wheezing, no rhonchi Abdominal: Soft, NT, ND, bowel sounds + Extremities: no edema, no cyanosis    The  results of significant diagnostics from this hospitalization (including imaging, microbiology, ancillary and laboratory) are listed below for reference.     Microbiology: No results found for this or any previous visit (from the past 240 hour(s)).   Labs: BNP (last 3 results) Recent Labs    02/03/21 0007 02/05/21 0933 02/06/21 0918  BNP 670.8* 1,051.4* 970.2*   Basic Metabolic Panel: Recent Labs  Lab 02/16/21 2327 02/17/21 0257 02/18/21 0253 02/20/21 0552 02/22/21 0432  NA 132* 133* 135 135  134*  K 3.9 3.8 3.7 3.3* 3.7  CL 87* 87* 89* 90* 90*  CO2 38* 38* 38* 39* 35*  GLUCOSE 140* 135* 167* 103* 118*  BUN 46* 45* 49* 41* 46*  CREATININE 1.56* 1.45* 1.68* 1.43* 1.67*  CALCIUM 8.7* 8.6* 8.5* 8.7* 8.6*  MG 1.9  --   --  2.0 2.1  PHOS  --   --   --  3.9 4.3   Liver Function Tests: No results for input(s): AST, ALT, ALKPHOS, BILITOT, PROT, ALBUMIN in the last 168 hours. No results for input(s): LIPASE, AMYLASE in the last 168 hours. No results for input(s): AMMONIA in the last 168 hours. CBC: Recent Labs  Lab 02/17/21 0257 02/18/21 0253 02/18/21 1351 02/20/21 0552 02/21/21 0504 02/21/21 1220 02/22/21 0432  WBC 6.9 6.5  --  5.6 5.1  --  5.2  NEUTROABS  --  4.3  --   --   --   --   --   HGB 8.3* 7.3* 8.3* 7.7* 7.4* 7.5* 7.5*  HCT 26.2* 23.2* 26.4* 25.0* 23.5* 24.2* 23.7*  MCV 79.4* 78.9*  --  78.9* 79.1*  --  79.8*  PLT 370 352  --  417* 405*  --  411*   Cardiac Enzymes: No results for input(s): CKTOTAL, CKMB, CKMBINDEX, TROPONINI in the last 168 hours. BNP: Invalid input(s): POCBNP CBG: Recent Labs  Lab 02/21/21 0803 02/21/21 1141 02/21/21 1636 02/22/21 0730 02/22/21 1119  GLUCAP 116* 146* 104* 112* 129*   D-Dimer No results for input(s): DDIMER in the last 72 hours. Hgb A1c No results for input(s): HGBA1C in the last 72 hours. Lipid Profile No results for input(s): CHOL, HDL, LDLCALC, TRIG, CHOLHDL, LDLDIRECT in the last 72 hours. Thyroid function  studies No results for input(s): TSH, T4TOTAL, T3FREE, THYROIDAB in the last 72 hours.  Invalid input(s): FREET3 Anemia work up No results for input(s): VITAMINB12, FOLATE, FERRITIN, TIBC, IRON, RETICCTPCT in the last 72 hours. Urinalysis    Component Value Date/Time   COLORURINE YELLOW 02/03/2021 0318   APPEARANCEUR CLEAR 02/03/2021 0318   LABSPEC 1.009 02/03/2021 0318   PHURINE 7.0 02/03/2021 0318   GLUCOSEU NEGATIVE 02/03/2021 0318   HGBUR NEGATIVE 02/03/2021 0318   BILIRUBINUR NEGATIVE 02/03/2021 0318   KETONESUR NEGATIVE 02/03/2021 0318   PROTEINUR NEGATIVE 02/03/2021 0318   NITRITE NEGATIVE 02/03/2021 0318   LEUKOCYTESUR NEGATIVE 02/03/2021 0318   Sepsis Labs Invalid input(s): PROCALCITONIN,  WBC,  LACTICIDVEN Microbiology No results found for this or any previous visit (from the past 240 hour(s)).   Time coordinating discharge: Over 30 minutes  SIGNED:   Shawna Clamp, MD  [facility] 02/22/2021, 3:40 PM Pager   If 7PM-7AM, please contact night-coverage

## 2021-02-22 NOTE — Progress Notes (Signed)
Physical Therapy Treatment Patient Details Name: Mandy [name] MRN: [id number] DOB: 1940-07-11 Today's Date: 02/22/2021   History of Present Illness 81 yo female admitted with acute on chronic resp failure, acute on chronic HF, bil pleural effusion. Hx of anemia, severe pulm HTN, DM, HF, CVA, CAD, CKD. Pt is non English speaking-family interprets-hospital interpreter not available    PT Comments    Pt ambulated 80' with RW, SaO2 93% on 2L O2, HR 113, no loss of balance, distance limited by 2/4 dyspnea. Improved HR and SaO2 levels with activity this session.    Recommendations for follow up therapy are one component of a multi-disciplinary discharge planning process, led by the attending physician.  Recommendations may be updated based on patient status, additional functional criteria and insurance authorization.  Follow Up Recommendations  Home health PT     Assistance Recommended at Discharge Intermittent Supervision/Assistance  Patient can return home with the following     Equipment Recommendations  None recommended by PT    Recommendations for Other Services       Precautions / Restrictions Precautions Precautions: Fall Precaution Comments: 2 lts Home oxygen, monitor sats/HR Restrictions Weight Bearing Restrictions: No     Mobility  Bed Mobility Overal bed mobility: Modified Independent, Independent Bed Mobility: Sit to Supine       Sit to supine: Independent        Transfers Overall transfer level: Needs assistance Equipment used: Rolling walker (2 wheels) Transfers: Sit to/from Stand Sit to Stand: Supervision           General transfer comment: steady, no loss of balance    Ambulation/Gait Ambulation/Gait assistance: Supervision Gait Distance (Feet): 80 Feet Assistive device: Rolling walker (2 wheels) Gait Pattern/deviations: Step-to pattern, Decreased step length - right, Decreased step length - left, Decreased stride length, Shuffle, Trunk  flexed Gait velocity: decr     General Gait Details: HR 113 walking, SaO2 93% on 3L O2 [place], no loss of balance, distance limited by 2/4 dyspnea   Stairs             Wheelchair Mobility    Modified Rankin (Stroke Patients Only)       Balance Overall balance assessment: Needs assistance Sitting-balance support: Feet unsupported Sitting balance-Leahy Scale: Fair     Standing balance support: Reliant on assistive device for balance Standing balance-Leahy Scale: Poor                              Cognition Arousal/Alertness: Awake/alert Behavior During Therapy: Flat affect Overall Cognitive Status: Difficult to assess                                 General Comments: follows directions well with visual cues, interpreter service does not cover her language, no family present        Exercises      General Comments        Pertinent Vitals/Pain Pain Assessment Pain Score: 0-No pain Faces Pain Scale: No hurt Facial Expression: Relaxed, neutral Body Movements: Absence of movements Muscle Tension: Relaxed Compliance with ventilator (intubated pts.): N/A Vocalization (extubated pts.): Talking in normal tone or no sound CPOT Total: 0    Home Living                          Prior Function  PT Goals (current goals can now be found in the care plan section) Acute Rehab PT Goals Patient Stated Goal: pt unable to state- non English speaking PT Goal Formulation: Patient unable to participate in goal setting Time For Goal Achievement: 02/23/21 Potential to Achieve Goals: Good Progress towards PT goals: Progressing toward goals    Frequency    Min 3X/week      PT Plan Current plan remains appropriate    Co-evaluation              AM-PAC PT "6 Clicks" Mobility   Outcome Measure  Help needed turning from your back to your side while in a flat bed without using bedrails?: None Help needed moving from  lying on your back to sitting on the side of a flat bed without using bedrails?: A Little Help needed moving to and from a bed to a chair (including a wheelchair)?: A Little Help needed standing up from a chair using your arms (e.g., wheelchair or bedside chair)?: A Little Help needed to walk in hospital room?: A Little Help needed climbing 3-5 steps with a railing? : A Little 6 Click Score: 19    End of Session Equipment Utilized During Treatment: Gait belt;Oxygen Activity Tolerance: Patient limited by fatigue;Patient tolerated treatment well Patient left: with call bell/phone within reach;with bed alarm set;in bed Nurse Communication: Mobility status PT Visit Diagnosis: Muscle weakness (generalized) (M62.81);Difficulty in walking, not elsewhere classified (R26.2)     Time: 0355-9741 PT Time Calculation (min) (ACUTE ONLY): 15 min  Charges:  $Gait Training: 8-22 mins                     Blondell Reveal Kistler PT 02/22/2021  Acute Rehabilitation Services Pager [phone] Office [phone]

## 2021-02-23 ENCOUNTER — Other Ambulatory Visit: Payer: Self-pay | Admitting: Family Medicine

## 2021-02-25 ENCOUNTER — Telehealth: Payer: Self-pay | Admitting: Pulmonary Disease

## 2021-02-26 NOTE — Telephone Encounter (Signed)
Sleep study ok, there is one actually ordered 02/02/2021. I doubt it is scheduled prior to appointment with me in March.

## 2021-02-26 NOTE — Telephone Encounter (Signed)
Called to notify patient that sleep study was placed on 02/02/2021. Informed patient that sleep study scheduling is behind but they should be calling her to get this scheduled soon. Told patient to call office back if she has any questions or concerns.   Follow up with MH is 03/11/2021.Marland Kitchen

## 2021-02-26 NOTE — Telephone Encounter (Signed)
Called patient and she states that she wants a sleep study done before seeing [facility] next month. I informed patient that I owould have to check with MH to see if he would like to order a sleep study or see patient first. Told patient when I got a response from [place] I would let her know.   Dr Silas Flood please advise

## 2021-03-10 ENCOUNTER — Ambulatory Visit
Admission: RE | Admit: 2021-03-10 | Discharge: 2021-03-10 | Disposition: A | Discharge status: Home / Self Care | Payer: Medicare HMO | Source: Ambulatory Visit | Attending: Internal Medicine | Admitting: Internal Medicine

## 2021-03-10 DIAGNOSIS — M810 Age-related osteoporosis without current pathological fracture: Secondary | ICD-10-CM

## 2021-03-11 ENCOUNTER — Inpatient Hospital Stay: Payer: Medicare HMO | Admitting: Pulmonary Disease

## 2021-03-12 LAB — FUNGUS CULTURE WITH STAIN

## 2021-03-12 LAB — FUNGAL ORGANISM REFLEX

## 2021-03-12 LAB — FUNGUS CULTURE RESULT

## 2021-03-31 ENCOUNTER — Encounter: Payer: Self-pay | Admitting: Pulmonary Disease

## 2021-03-31 ENCOUNTER — Ambulatory Visit (INDEPENDENT_AMBULATORY_CARE_PROVIDER_SITE_OTHER): Payer: Medicare HMO | Admitting: Pulmonary Disease

## 2021-03-31 VITALS — BP 130/68 | HR 74 | Temp 98.6°F | Ht 59.0 in | Wt 161.2 lb

## 2021-03-31 DIAGNOSIS — I272 Pulmonary hypertension, unspecified: Secondary | ICD-10-CM | POA: Diagnosis not present

## 2021-03-31 NOTE — Patient Instructions (Signed)
Nice meeting ? ?Continue all your medications ? ?We will get labs today ? ?I would like to add a medication called Opsumit, will be 10 mg once a day.  We will work on getting this prescribed for you, you may take a couple weeks to get it to you. ? ?In addition, I would like to switch the sildenafil to tadalafil.  Once you get tadalafil take 40 mg (2 tablets) once a day.  Stop the sildenafil once you start this medication.  These are the same class of medications and should not be taken at the same time.  If the cost of tadalafil is too much, we will stick with the sildenafil.  I have sent a message to our pharmacy team. ? ?Return to clinic in 4 weeks or sooner as needed with Dr. Silas Flood ?

## 2021-03-31 NOTE — Progress Notes (Signed)
[name]  ID: Mandy [name], female    DOB: 1940/08/23, 81 y.o.   MRN: [id number] ? ?Chief Complaint  ?Patient presents with  ? Hospitalization Follow-up  ?  Hospital follow up. Pt states her oxygen drops when she walks. She is on 4L when she walks.   ? ? ?Referring provider: ?[name], MD ? ?HPI:  ? ?81 y.o. woman whom we are seeing in hospital follow-up for volume overload, pulm hypertension, chronic hypoxemic respiratory failure.  Most recent discharge summary reviewed.  Discharge summary prior to that also reviewed. ? ?Patient has been noted to have RV dysfunction, concern for pulmonary hypertension on echocardiogram dating back to ? ?PMH:  ?Smoker/ Smoking History:  ?Maintenance:   ?Pt of:  ? ?03/31/2021  - Visit  ? ? ? ?Questionaires / Pulmonary Flowsheets:  ? ?ACT:  ?   ? View : No data to display.  ?  ?  ?  ? ? ?MMRC: ?   ? View : No data to display.  ?  ?  ?  ? ? ?Epworth:  ?   ? View : No data to display.  ?  ?  ?  ? ? ?Tests:  ? ?FENO:  ?No results found for: NITRICOXIDE ? ?PFT: ?   ? View : No data to display.  ?  ?  ?  ? ? ?WALK:  ?   ? View : No data to display.  ?  ?  ?  ? ? ?Imaging: ?Personally reviewed and as per EMR discussion this note ?DG BONE DENSITY (DXA) ? ?Result Date: 03/10/2021 ?EXAM: DUAL X-RAY ABSORPTIOMETRY (DXA) FOR BONE MINERAL DENSITY IMPRESSION: Referring Physician:  [name] Your patient completed a bone mineral density test using GE Lunar iDXA system (analysis version: 16). Technologist: [facility] PATIENT: Name: Mandy, [name] Patient ID: [id number] Birth Date: March 18, 1940 Height: 56.5 in. Sex: Female Measured: 03/10/2021 Weight: 159.0 lbs. Indications: Advanced Age, Estrogen Deficient, Postmenopausal Fractures: None Treatments: Calcium (E943.0), Fosamax, Vitamin D (E933.5) ASSESSMENT: The BMD measured at Femur Neck Right is 0.814 g/cm2 with a T-score of -1.6. This patient is considered osteopenic/low bone mass according to [facility]) criteria. The quality  of the exam is good. The lumbar spine was excluded due to degenerative changes. The patient does not meet FRAX criteria. Site Region Measured Date Measured Age YA BMD Significant CHANGE T-score DualFemur Neck Right 03/10/2021 79.9 -1.6 0.814 g/cm2 DualFemur Neck Right 06/19/2017 76.1 -1.5 0.823 g/cm2 DualFemur Total Mean 03/10/2021 79.9 -0.9 0.891 g/cm2 DualFemur Total Mean 06/19/2017 76.1 -0.8 0.912 g/cm2 Left Forearm Radius 33% 03/10/2021 79.9 -1.3 0.760 g/cm2 Left Forearm Radius 33% 06/19/2017 76.1 -1.1 0.793 g/cm2 World Health Organization [facility]) criteria for post-menopausal, Caucasian Women: Normal       T-score at or above -1 SD Osteopenia   T-score between -1 and -2.5 SD Osteoporosis T-score at or below -2.5 SD RECOMMENDATION: 1. All patients should optimize calcium and vitamin D intake. 2. Consider FDA-approved medical therapies in postmenopausal women and men aged 51 years and older, based on the following: a. A hip or vertebral (clinical or morphometric) fracture. b. T-score = -2.5 at the femoral neck or spine after appropriate evaluation to exclude secondary causes. c. Low bone mass (T-score between -1.0 and -2.5 at the femoral neck or spine) and a 10-year probability of a hip fracture = 3% or a 10-year probability of a major osteoporosis-related fracture = 20% based on the [place]-adapted WHO algorithm. d. Clinician judgment and/or patient preferences may indicate  treatment for people with 10-year fracture probabilities above or below these levels. FOLLOW-UP: Patients with diagnosis of osteoporosis or at high risk for fracture should have regular bone mineral density tests.? Patients eligible for Medicare are allowed routine testing every 2 years.? The testing frequency can be increased to one year for patients who have rapidly progressing disease, are receiving or discontinuing medical therapy to restore bone mass, or have additional risk factors. I have reviewed this study and agree with the findings.  [facility] Electronically Signed   By: Elmer Picker M.D.   On: 03/10/2021 14:48   ? ?Lab Results: ?Personally reviewed ?CBC ?   ?Component Value Date/Time  ? WBC 5.2 02/22/2021 0432  ? RBC 2.97 (L) 02/22/2021 0432  ? HGB 7.5 (L) 02/22/2021 0432  ? HCT 23.7 (L) 02/22/2021 0432  ? HCT 33.2 (L) 02/03/2019 1220  ? PLT 411 (H) 02/22/2021 0432  ? MCV 79.8 (L) 02/22/2021 0432  ? MCH 25.3 (L) 02/22/2021 0432  ? MCHC 31.6 02/22/2021 0432  ? RDW 15.9 (H) 02/22/2021 0432  ? LYMPHSABS 1.2 02/18/2021 0253  ? MONOABS 0.8 02/18/2021 0253  ? EOSABS 0.1 02/18/2021 0253  ? BASOSABS 0.0 02/18/2021 0253  ? ? ?BMET ?   ?Component Value Date/Time  ? NA 134 (L) 02/22/2021 0432  ? NA 143 01/25/2021 1414  ? K 3.7 02/22/2021 0432  ? CL 90 (L) 02/22/2021 0432  ? CO2 35 (H) 02/22/2021 0432  ? GLUCOSE 118 (H) 02/22/2021 0432  ? BUN 46 (H) 02/22/2021 0432  ? BUN 33 (H) 01/25/2021 1414  ? CREATININE 1.67 (H) 02/22/2021 0432  ? CREATININE 1.50 (H) 01/04/2021 1634  ? CALCIUM 8.6 (L) 02/22/2021 0432  ? GFRNONAA 31 (L) 02/22/2021 0432  ? GFRNONAA 43 (L) 07/07/2020 0000  ? GFRAA 50 (L) 07/07/2020 0000  ? ? ?BNP ?   ?Component Value Date/Time  ? BNP 469.6 (H) 02/06/2021 1245  ? ? ?ProBNP ?   ?Component Value Date/Time  ? PROBNP 443.0 (H) 01/14/2021 1653  ? ? ?Specialty Problems   ? ?  ? Pulmonary Problems  ? SORE THROAT  ?  Qualifier: Diagnosis of  ?By: Tomma Lightning MD, Claiborne Billings  ? ?  ?  ? Acute respiratory failure with hypoxia ([place])  ? Pleural effusion on right  ? Hypoxia  ? Acute on chronic respiratory failure with hypoxia and hypercapnia (HCC)  ? Chronic respiratory failure with hypoxia, on home oxygen therapy ([place])  ? 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[place])  ? ? ?No Known Allergies ? ?Immunization History  ?Administered Date(s) Administered  ? Influenza-Unspecified 11/03/2017  ? ? ?Past Medical History:  ?Diagnosis Date  ? Aortic atherosclerosis ([place]) 10/08/2018  ? Arthritis   ? Chronic kidney disease, stage 3a ([place])   ? Chronic  respiratory failure ([place])   ? Diabetes mellitus without complication ([place])   ? GERD (gastroesophageal reflux disease)   ? Hepatic steatosis 10/08/2018  ? Hyperlipidemia   ? Hypertension   ? Hyperthyroidism   ? Mass of right ovary 10/08/2018  ? 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 Mild CAD   ? Osteoporosis   ? Pneumonia   ? Renal infarct [facility])   ? SBO (small bowel obstruction) ([place]) 08/09/2018  ? Severe pulmonary hypertension ([place])   ? Stroke (cerebrum) ([place])   ? ? ?Tobacco History: ?Social History  ? ?Tobacco Use  ?Smoking Status Never  ?Smokeless Tobacco Never  ? ?Counseling given: Not Answered ? ? ?Continue  to not smoke ? ?Outpatient Encounter Medications as of 03/31/2021  ?Medication Sig  ? acetaminophen (TYLENOL) 500 MG tablet Take 1,000 mg by mouth every 6 (six) hours as needed for fever or headache (pain).  ? albuterol (PROVENTIL) (2.5 MG/3ML) 0.083% nebulizer solution Take 2.5 mg by nebulization every 6 (six) hours as needed for wheezing or shortness of breath.  ? albuterol (VENTOLIN HFA) 108 (90 Base) MCG/ACT inhaler Inhale 2 puffs into the lungs every 6 (six) hours as needed for wheezing or shortness of breath.  ? alendronate (FOSAMAX) 70 MG tablet Take 70 mg by mouth every Tuesday. Take with a full glass of water on an empty stomach.  ? atorvastatin (LIPITOR) 80 MG tablet Take 1 tablet (80 mg total) by mouth every evening.  ? calcium carbonate (OSCAL) 1500 (600 Ca) MG TABS tablet Take 600 mg of elemental calcium by mouth 2 (two) times daily with a meal.  ? carvedilol (COREG) 12.5 MG tablet Take 1 tablet (12.5 mg total) by mouth 2 (two) times daily with a meal.  ? cetirizine (ZYRTEC) 10 MG tablet Take 10 mg by mouth daily.  ? clopidogrel (PLAVIX) 75 MG tablet Take 1 tablet (75 mg total) by mouth daily.  ? diclofenac sodium (VOLTAREN) 1 % GEL Apply 4 g topically 4 (four) times daily as needed for pain. shoulder  ? fluticasone (FLONASE) 50 MCG/ACT nasal spray Place 1 spray into  both nostrils daily.  ? gabapentin (NEURONTIN) 100 MG capsule Take 100 mg by mouth at bedtime.  ? hydrALAZINE (APRESOLINE) 25 MG tablet Take 1 tablet (25 mg total) by mouth every 8 (eight) hours.  ? melatonin 5 MG

## 2021-04-01 ENCOUNTER — Other Ambulatory Visit: Payer: Self-pay | Admitting: Emergency Medicine

## 2021-04-01 ENCOUNTER — Other Ambulatory Visit (HOSPITAL_COMMUNITY): Payer: Self-pay

## 2021-04-01 ENCOUNTER — Other Ambulatory Visit: Payer: Self-pay | Admitting: Internal Medicine

## 2021-04-01 ENCOUNTER — Telehealth: Payer: Self-pay | Admitting: Pharmacist

## 2021-04-01 DIAGNOSIS — J9611 Chronic respiratory failure with hypoxia: Secondary | ICD-10-CM

## 2021-04-01 DIAGNOSIS — I272 Pulmonary hypertension, unspecified: Secondary | ICD-10-CM

## 2021-04-01 LAB — COMPREHENSIVE METABOLIC PANEL
ALT: 11 U/L (ref 0–35)
AST: 18 U/L (ref 0–37)
Albumin: 4 g/dL (ref 3.5–5.2)
Alkaline Phosphatase: 45 U/L (ref 39–117)
BUN: 33 mg/dL — ABNORMAL HIGH (ref 6–23)
CO2: 40 mEq/L — ABNORMAL HIGH (ref 19–32)
Calcium: 9.2 mg/dL (ref 8.4–10.5)
Chloride: 92 mEq/L — ABNORMAL LOW (ref 96–112)
Creatinine, Ser: 1.28 mg/dL — ABNORMAL HIGH (ref 0.40–1.20)
GFR: 39.71 mL/min — ABNORMAL LOW (ref >60.00)
Glucose, Bld: 98 mg/dL (ref 70–99)
Potassium: 4.1 mEq/L (ref 3.5–5.1)
Sodium: 139 mEq/L (ref 135–145)
Total Bilirubin: 0.3 mg/dL (ref 0.2–1.2)
Total Protein: 7.2 g/dL (ref 6.0–8.3)

## 2021-04-01 LAB — BRAIN NATRIURETIC PEPTIDE: Pro B Natriuretic peptide (BNP): 95 pg/mL (ref 0.0–100.0)

## 2021-04-01 NOTE — Telephone Encounter (Signed)
Received notification from [facility] regarding a prior authorization for  TADALAFIL PAH . Authorization has been APPROVED from 01/03/21 to 01/02/22.  ? ?Per test claim, copay for 30 days supply is $0 ? ?Patient can fill through [employer]: [phone] ? ?Authorization # [id number] ? ?OPSUMIT REMS form for patient placed in file cabinet at front desk ? ?Knox Saliva, PharmD, MPH, BCPS ?Clinical Pharmacist (Rheumatology and Pulmonology) ?

## 2021-04-01 NOTE — Telephone Encounter (Addendum)
Submitted a Prior Authorization request to [facility] for  Opsumit  via CoverMyMeds. Will update once we receive a response. ? ?Key: [id number] ? ?Patient and provider will need to complete REMS form to start medication. Patient has medicare and may need to also pursue patient assistance. ? ?Knox Saliva, PharmD, MPH, BCPS ?Clinical Pharmacist (Rheumatology and Pulmonology) ? ?----- Message from Lanier Clam, MD sent at 03/31/2021  5:05 PM EDT ----- ?What specialty pharmacy should I send new medications to, want to start tadalafil 40 mg daily and stop the sildenafil.  In addition would like to start Opsumit 10 mg daily.  Please let me know where to send these medications.  Thanks! ?

## 2021-04-01 NOTE — Telephone Encounter (Signed)
Received notification from [facility] regarding a prior authorization for  OPSUMIT . Authorization has been APPROVED from 01/03/2021 to 01/02/2022. Approval letter sent to scan center. ? ?Per test claim, copay for 30 days supply is $0.00 ? ?Patient can fill through [facility]: [phone] , however they currently do not stock this medication. ? ?Authorization # [id number] ?

## 2021-04-01 NOTE — Progress Notes (Signed)
Labs look improved since hospitalization.

## 2021-04-01 NOTE — Telephone Encounter (Signed)
Spoke with patient's daughter, [name], regarding Opsumit approval and REMS form that will need to be signed. She states she will stop by clinic today to sign form for her mother (daughter is legal guardian) ? ?Submitted a Prior Authorization request to [facility] for  tadalafil ([place])  via CoverMyMeds. Will update once we receive a response. ? ?Key: [id number] ? ?Knox Saliva, PharmD, MPH, BCPS ?Clinical Pharmacist (Rheumatology and Pulmonology) ?

## 2021-04-02 LAB — COMPLETE METABOLIC PANEL WITH GFR
AG Ratio: 1.4 (calc) (ref 1.0–2.5)
ALT: 11 U/L (ref 6–29)
AST: 21 U/L (ref 10–35)
Albumin: 4.2 g/dL (ref 3.6–5.1)
Alkaline phosphatase (APISO): 54 U/L (ref 37–153)
BUN/Creatinine Ratio: 24 (calc) — ABNORMAL HIGH (ref 6–22)
BUN: 28 mg/dL — ABNORMAL HIGH (ref 7–25)
CO2: 25 mmol/L (ref 20–32)
Calcium: 9.3 mg/dL (ref 8.6–10.4)
Chloride: 101 mmol/L (ref 98–110)
Creat: 1.19 mg/dL — ABNORMAL HIGH (ref 0.60–1.00)
Globulin: 3 g/dL (calc) (ref 1.9–3.7)
Glucose, Bld: 203 mg/dL — ABNORMAL HIGH (ref 65–99)
Potassium: 4.9 mmol/L (ref 3.5–5.3)
Sodium: 149 mmol/L — ABNORMAL HIGH (ref 135–146)
Total Bilirubin: 0.3 mg/dL (ref 0.2–1.2)
Total Protein: 7.2 g/dL (ref 6.1–8.1)
eGFR: 47 mL/min/{1.73_m2} — ABNORMAL LOW (ref >=60)

## 2021-04-02 LAB — LIPID PANEL
Cholesterol: 188 mg/dL (ref <200)
HDL: 61 mg/dL (ref >=50)
LDL Cholesterol (Calc): 109 mg/dL (calc) — ABNORMAL HIGH
Non-HDL Cholesterol (Calc): 127 mg/dL (calc) (ref <130)
Total CHOL/HDL Ratio: 3.1 (calc) (ref <5.0)
Triglycerides: 90 mg/dL (ref <150)

## 2021-04-02 LAB — TSH: TSH: 2.3 mIU/L (ref 0.40–4.50)

## 2021-04-02 LAB — CBC
HCT: 32.1 % — ABNORMAL LOW (ref 35.0–45.0)
Hemoglobin: 9.4 g/dL — ABNORMAL LOW (ref 11.7–15.5)
MCH: 24 pg — ABNORMAL LOW (ref 27.0–33.0)
MCHC: 29.3 g/dL — ABNORMAL LOW (ref 32.0–36.0)
MCV: 81.9 fL (ref 80.0–100.0)
MPV: 11.5 fL (ref 7.5–12.5)
Platelets: 315 10*3/uL (ref 140–400)
RBC: 3.92 10*6/uL (ref 3.80–5.10)
RDW: 15.8 % — ABNORMAL HIGH (ref 11.0–15.0)
WBC: 4.8 10*3/uL (ref 3.8–10.8)

## 2021-04-02 NOTE — Telephone Encounter (Signed)
Signed REMS forms returned to pharmacy team and have been faxed in. Pages 1 and 2 of [employer] form have also been completed and placed on Devki's desk. Will continue to f/u. ? ?Opsumit REMS fax# [phone] ?Phone# [phone] ?

## 2021-04-05 MED ORDER — TADALAFIL (PAH) 20 MG PO TABS: 40.0000 mg | ORAL_TABLET | Freq: Every day | ORAL | 5 refills | Status: DC

## 2021-04-05 MED ORDER — OPSUMIT 10 MG PO TABS: 10.0000 mg | ORAL_TABLET | Freq: Every day | ORAL | 5 refills | Status: DC

## 2021-04-05 NOTE — Telephone Encounter (Signed)
Rx for tadalafil [facility]) '40mg'$  daily sent to [facility] ?Rx for Opsumit '10mg'$  daily sent to [facility]. ? ?Spoke with patient's daughter, [name] - provided her with pharmacy phone number. Advised her that she should reach out tomorrow to set up shipment of both medications. She was also advisd that her mother should continue sildenafil until shipment arrives to home and then she can start tadalafil (which will replace sildenafil). Her daughter verbalized understanding. I also reviewed that the pharmacy may require her mother's verbal consent to allow daughter to speak on her behalf. ? ?Knox Saliva, PharmD, MPH, BCPS ?Clinical Pharmacist (Rheumatology and Pulmonology) ?

## 2021-04-06 NOTE — Telephone Encounter (Signed)
Received fax from [employer] stating [place] of Medical Necessity is missing. This form was already signed by Dr. Silas Flood and patient's daughter but not submitted. Faxed today with note that REMS forms have already been submitted ? ?Fax: [phone] ?Phone: [phone] ?Ref: [id number] ? ?Knox Saliva, PharmD, MPH, BCPS ?Clinical Pharmacist (Rheumatology and Pulmonology) ?

## 2021-04-06 NOTE — Progress Notes (Signed)
?Cardiology Office Note:   ? ?Date:  04/07/2021  ? ?IDTahjanae [name], DOB 07-Jul-1940, MRN [id number] ? ?PCP:  [name], MD  ?Cardiologist:  Donato Heinz, MD  ?Electrophysiologist:  None  ? ?Referring MD: [name], MD  ? ?Chief Complaint  ?Patient presents with  ? Congestive Heart Failure  ? ? ?History of Present Illness:   ? ?Mandy [name] is a 81 y.o. 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  She has had multiple recent admissions with acute on chronic respiratory failure.  Most recently was admitted from 1/31 through 02/22/2021.  She was admitted with acute on chronic hypoxic/hypercapnic respiratory failure, severe pulmonary hypertension, acute on chronic diastolic heart failure and RV failure.  She required milrinone, with transition to sildenafil.  She was diuresed and ultimately discharged on torsemide.  Most recent echo 02/15/2021 showed EF 55 to 60%, moderate LVH, severely elevated pulmonary systolic pressure (49.7 mmHg), normal RV size, RV function not well visualized, moderate TR.  LHC/RHC 11/30/2020 showed nonobstructive CAD (30% mid LAD, 30% distal LAD), moderate pulm hypertension (RA 2, RV 76/0, PA 72/16/38, PW 18, LVEDP 9, CI 3.6). ? ?Since discharge from the hospital, she reports she is doing okay.  Denies any chest pain.  Reports breathing has improved.  Has been fatigued.  Denies any lightheadedness, syncope, lower extremity edema, or palpitations.  Daughter reports she is monitoring daily weights and have been stable. ? ? ?Wt Readings from Last 3 Encounters:  ?04/07/21 159 lb 3.2 oz (72.2 kg)  ?03/31/21 161 lb 3.2 oz (73.1 kg)  ?02/22/21 157 lb 13.6 oz (71.6 kg)  ? ? ? ?Past Medical History:  ?Diagnosis  Date  ? Aortic atherosclerosis ([place]) 10/08/2018  ? Arthritis   ? Chronic kidney disease, stage 3a ([place])   ? Chronic respiratory failure ([place])   ? Diabetes mellitus without complication ([place])   ? GERD (gastroesophageal reflux disease)   ? Hepatic steatosis 10/08/2018  ? Hyperlipidemia   ? Hypertension   ? Hyperthyroidism   ? Mass of right ovary 10/08/2018  ? 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 Mild CAD   ? Osteoporosis   ? Pneumonia   ? Renal infarct [facility])   ? SBO (small bowel obstruction) ([place]) 08/09/2018  ? Severe pulmonary hypertension ([place])   ? Stroke (cerebrum) ([place])   ? ? ?Past Surgical History:  ?Procedure Laterality Date  ? CATARACT EXTRACTION W/ INTRAOCULAR LENS  IMPLANT, BILATERAL    ? COLONOSCOPY    ? LEFT HEART CATH AND CORONARY ANGIOGRAPHY N/A 11/30/2020  ? Procedure: LEFT HEART CATH AND CORONARY ANGIOGRAPHY;  Surgeon: Early Osmond, MD;  Location: [facility];  Service: Cardiovascular;  Laterality: N/A;  ? RIGHT/LEFT HEART CATH AND CORONARY ANGIOGRAPHY N/A 11/30/2020  ? Procedure: RIGHT/LEFT HEART CATH AND CORONARY ANGIOGRAPHY;  Surgeon: Early Osmond, MD;  Location: [place] [facility];  Service: Cardiovascular;  Laterality: N/A;  ? ? ?Current Medications: ?Current Meds  ?Medication Sig  ? acetaminophen (TYLENOL) 500 MG tablet Take 1,000 mg by mouth every 6 (six) hours as needed for fever or headache (pain).  ? albuterol (PROVENTIL) (2.5 MG/3ML) 0.083% nebulizer solution Take 2.5 mg by nebulization every 6 (six) hours as needed for wheezing or shortness of  breath.  ? albuterol (VENTOLIN HFA) 108 (90 Base) MCG/ACT inhaler Inhale 2 puffs into the lungs every 6 (six) hours as needed for wheezing or shortness of breath.  ? alendronate (FOSAMAX) 70 MG tablet Take 70 mg by mouth every Tuesday. Take with a full glass of water on an empty stomach.  ? atorvastatin (LIPITOR) 80 MG tablet Take 1 tablet (80 mg total) by mouth every evening.  ? calcium carbonate (OSCAL)  1500 (600 Ca) MG TABS tablet Take 600 mg of elemental calcium by mouth 2 (two) times daily with a meal.  ? carvedilol (COREG) 12.5 MG tablet Take 1 tablet (12.5 mg total) by mouth 2 (two) times daily with a meal.  ? cetirizine (ZYRTEC) 10 MG tablet Take 10 mg by mouth daily.  ? clopidogrel (PLAVIX) 75 MG tablet Take 1 tablet (75 mg total) by mouth daily.  ? diclofenac sodium (VOLTAREN) 1 % GEL Apply 4 g topically 4 (four) times daily as needed for pain. shoulder  ? fluticasone (FLONASE) 50 MCG/ACT nasal spray Place 1 spray into both nostrils daily.  ? gabapentin (NEURONTIN) 100 MG capsule Take 100 mg by mouth at bedtime.  ? hydrALAZINE (APRESOLINE) 25 MG tablet Take 1 tablet (25 mg total) by mouth every 8 (eight) hours.  ? melatonin 5 MG TABS Take 10 mg by mouth at bedtime.  ? methimazole (TAPAZOLE) 5 MG tablet Take 0.5 tablets (2.5 mg total) by mouth daily.  ? Multiple Vitamin (MULTIVITAMIN WITH MINERALS) TABS tablet Take 1 tablet by mouth every morning.  ? Omega-3 Fatty Acids (FISH OIL PO) Take 1 capsule by mouth every morning.  ? OXYGEN Inhale 4 L into the lungs continuous.  ? pantoprazole (PROTONIX) 40 MG tablet Take 1 tablet (40 mg total) by mouth daily at 6 (six) AM. (Patient taking differently: Take 40 mg by mouth every morning.)  ? potassium chloride SA (KLOR-CON M) 20 MEQ tablet Take 1 tablet (20 mEq total) by mouth daily.  ? tadalafil, PAH, (ADCIRCA) 20 MG tablet Take 2 tablets (40 mg total) by mouth daily.  ? torsemide 40 MG TABS Take 40 mg by mouth daily.  ? vitamin B-12 (CYANOCOBALAMIN) 1000 MCG tablet Take 1,000 mcg by mouth daily.  ? vitamin C (ASCORBIC ACID) 500 MG tablet Take 500 mg by mouth daily.  ? Vitamin D, Cholecalciferol, 25 MCG (1000 UT) TABS Take 1,000 Units by mouth daily.  ? zinc gluconate 50 MG tablet Take 50 mg by mouth daily.  ?  ? ?Allergies:   Patient has no known allergies.  ? ?Social History  ? ?Socioeconomic History  ? Marital status: Widowed  ?  Spouse name: Not on file  ? Number  of children: Not on file  ? Years of education: Not on file  ? Highest education level: Not on file  ?Occupational History  ? Not on file  ?Tobacco Use  ? Smoking status: Never  ? Smokeless tobacco: Never  ?Vaping Use  ? Vaping Use: Never used  ?Substance and Sexual Activity  ? Alcohol use: No  ? Drug use: No  ? Sexual activity: Not on file  ?Other Topics Concern  ? Not on file  ?Social History Narrative  ? Patient is from [place]  ? Speaks [place]  ? ?Social Determinants of Health  ? ?Financial Resource Strain: Not on file  ?Food Insecurity: Not on file  ?Transportation Needs: Not on file  ?Physical Activity: Not on file  ?Stress: Not on file  ?Social Connections: Not on  file  ?  ? ?Family History: ?The patient's family history includes Kidney disease in her son. There is no history of Colon cancer or Breast cancer. ? ?ROS:   ?Please see the history of present illness.    ? All other systems reviewed and are negative. ? ?EKGs/Labs/Other Studies Reviewed:   ? ?The following studies were reviewed today: ? ? ?EKG:  EKG is not ordered today.  ? ?Recent Labs: ?02/06/2021: B Natriuretic Peptide 469.6 ?02/22/2021: Magnesium 2.1 ?03/31/2021: Pro B Natriuretic peptide (BNP) 95.0 ?04/01/2021: ALT 11; BUN 28; Creat 1.19; Hemoglobin 9.4; Platelets 315; Potassium 4.9; Sodium 149; TSH 2.30  ?Recent Lipid Panel ?   ?Component Value Date/Time  ? CHOL 188 04/01/2021 0000  ? TRIG 90 04/01/2021 0000  ? HDL 61 04/01/2021 0000  ? CHOLHDL 3.1 04/01/2021 0000  ? VLDL 11 02/05/2021 0243  ? LDLCALC 109 (H) 04/01/2021 0000  ? ? ?Physical Exam:   ? ?VS:  BP 136/70   Pulse 84   Ht '4\' 11"'$  (1.499 m)   Wt 159 lb 3.2 oz (72.2 kg)   SpO2 100%   BMI 32.15 kg/m?    ? ?Wt Readings from Last 3 Encounters:  ?04/07/21 159 lb 3.2 oz (72.2 kg)  ?03/31/21 161 lb 3.2 oz (73.1 kg)  ?02/22/21 157 lb 13.6 oz (71.6 kg)  ?  ? ?GEN:  in no acute distress ?HEENT: Normal ?NECK: No JVD; No carotid bruits ?CARDIAC: RRR, no murmurs, rubs, gallops ?RESPIRATORY:   Clear to auscultation without rales, wheezing or rhonchi  ?ABDOMEN: Soft, non-tender, non-distended ?MUSCULOSKELETAL:  No edema; No deformity  ?SKIN: Warm and dry ?NEUROLOGIC:  Alert and oriented x 3 ?PSYCHIATR

## 2021-04-07 ENCOUNTER — Ambulatory Visit (INDEPENDENT_AMBULATORY_CARE_PROVIDER_SITE_OTHER): Payer: Medicare HMO | Admitting: Cardiology

## 2021-04-07 ENCOUNTER — Encounter: Payer: Self-pay | Admitting: Cardiology

## 2021-04-07 VITALS — BP 136/70 | HR 84 | Ht 59.0 in | Wt 159.2 lb

## 2021-04-07 DIAGNOSIS — I272 Pulmonary hypertension, unspecified: Secondary | ICD-10-CM

## 2021-04-07 DIAGNOSIS — N1831 Chronic kidney disease, stage 3a: Secondary | ICD-10-CM

## 2021-04-07 DIAGNOSIS — I5032 Chronic diastolic (congestive) heart failure: Secondary | ICD-10-CM

## 2021-04-07 DIAGNOSIS — I639 Cerebral infarction, unspecified: Secondary | ICD-10-CM

## 2021-04-07 DIAGNOSIS — I10 Essential (primary) hypertension: Secondary | ICD-10-CM | POA: Diagnosis not present

## 2021-04-07 DIAGNOSIS — E870 Hyperosmolality and hypernatremia: Secondary | ICD-10-CM

## 2021-04-07 DIAGNOSIS — J9611 Chronic respiratory failure with hypoxia: Secondary | ICD-10-CM

## 2021-04-07 NOTE — Patient Instructions (Signed)
Medication Instructions:  ?Your physician recommends that you continue on your current medications as directed. Please refer to the Current Medication list given to you today. ? ?*If you need a refill on your cardiac medications before your next appointment, please call your pharmacy* ? ? ?Lab Work: ?Please return for labs next week (BMET) ? ?Our in office lab hours are Monday-Friday 8:00-4:00, closed for lunch 12:45-1:45 pm.  No appointment needed. ? ?Follow-Up: ?At [facility], you and your health needs are our priority.  As part of our continuing mission to provide you with exceptional heart care, we have created designated Provider Care Teams.  These Care Teams include your primary Cardiologist (physician) and Advanced Practice Providers (APPs -  Physician Assistants and Nurse Practitioners) who all work together to provide you with the care you need, when you need it. ? ?We recommend signing up for the patient portal called "MyChart".  Sign up information is provided on this After Visit Summary.  MyChart is used to connect with patients for Virtual Visits (Telemedicine).  Patients are able to view lab/test results, encounter notes, upcoming appointments, etc.  Non-urgent messages can be sent to your provider as well.   ?To learn more about what you can do with MyChart, go to [email or web address].   ? ?Your next appointment:   ?3 months with APP ?6 months with Dr. Gardiner Rhyme ? ? ?

## 2021-04-14 ENCOUNTER — Other Ambulatory Visit (HOSPITAL_COMMUNITY): Payer: Self-pay

## 2021-04-17 LAB — BASIC METABOLIC PANEL
BUN/Creatinine Ratio: 28 (ref 12–28)
BUN: 41 mg/dL — ABNORMAL HIGH (ref 8–27)
CO2: 35 mmol/L — ABNORMAL HIGH (ref 20–29)
Calcium: 9.1 mg/dL (ref 8.7–10.3)
Chloride: 93 mmol/L — ABNORMAL LOW (ref 96–106)
Creatinine, Ser: 1.47 mg/dL — ABNORMAL HIGH (ref 0.57–1.00)
Glucose: 111 mg/dL — ABNORMAL HIGH (ref 70–99)
Potassium: 4.3 mmol/L (ref 3.5–5.2)
Sodium: 142 mmol/L (ref 134–144)
eGFR: 36 mL/min/{1.73_m2} — ABNORMAL LOW (ref >59)

## 2021-04-20 ENCOUNTER — Encounter: Payer: Self-pay | Admitting: *Deleted

## 2021-04-27 ENCOUNTER — Telehealth: Payer: Self-pay | Admitting: Cardiology

## 2021-04-27 NOTE — Telephone Encounter (Signed)
? ?*  STAT* If patient is at the pharmacy, call can be transferred to refill team. ? ? ?1. Which medications need to be refilled? (please list name of each medication and dose if known) carvedilol (COREG) 12.5 MG tablet ?clopidogrel (PLAVIX) 75 MG tablet ? ?2. Which pharmacy/location (including street and city if local pharmacy) is medication to be sent to?[place], [place]. ? ?3. Do they need a 30 day or 90 day supply? 90 days ? ?Pt is out of meds, needs refill today ?

## 2021-04-28 ENCOUNTER — Ambulatory Visit (INDEPENDENT_AMBULATORY_CARE_PROVIDER_SITE_OTHER): Payer: Medicare HMO | Admitting: Pulmonary Disease

## 2021-04-28 ENCOUNTER — Encounter: Payer: Self-pay | Admitting: Pulmonary Disease

## 2021-04-28 VITALS — BP 130/74 | HR 84 | Temp 98.0°F | Wt 162.4 lb

## 2021-04-28 DIAGNOSIS — I272 Pulmonary hypertension, unspecified: Secondary | ICD-10-CM

## 2021-04-28 DIAGNOSIS — Z9981 Dependence on supplemental oxygen: Secondary | ICD-10-CM

## 2021-04-28 DIAGNOSIS — J9611 Chronic respiratory failure with hypoxia: Secondary | ICD-10-CM

## 2021-04-28 MED ORDER — CARVEDILOL 12.5 MG PO TABS: 12.5000 mg | ORAL_TABLET | Freq: Two times a day (BID) | ORAL | 1 refills | Status: DC

## 2021-04-28 NOTE — Telephone Encounter (Signed)
Refill for Carvedilol (Coreg) sent to pharmacy. And Clopidogrel (Plavix) Patient not taking:  Reported on 04/28/2021 as per Pulmonary Dr. Marland Kitchen

## 2021-04-28 NOTE — Patient Instructions (Addendum)
Nice to see you ? ?The prescription for tadalafil was sent to [facility] mail pharmacy. Please call them at [phone] and ask when they are going to send to you. ? ?Once you get tadalafil, start taking 40 mg once daily and stop taking sildenafil. ? ?Continue taking the Opsumit. ? ?We will call [employer] and ask again to service the concentrator machine -  I am sorry they have not been out to fix it yet  ? ?Return to clinic in 4 weeks or sooner as needed with Dr. Silas Flood, we will need labs at that time ?

## 2021-04-29 NOTE — Progress Notes (Signed)
[name]  ID: Mandy [name], female    DOB: 06-May-1940, 81 y.o.   MRN: [id number] ? ?Chief Complaint  ?Patient presents with  ? Follow-up  ?  Pt is not on Opsumit at this time waiting for approval or not. But pt is on tadalfil. Pt states that she is doing ok. She states that she has body pain, some wheezing noted.   ? ? ?Referring provider: ?[name], MD ? ?HPI:  ? ?81 y.o. woman whom we are seeing in follow-up for pulmonary hypertension. ? ?Overall doing okay.  Several weeks out from hospitalization.  At last visit was hospital follow-up.  Weight continues to keep up a little bit.  5 pounds up from discharge.  Only about 1 pound up from visit 4 weeks ago.  She denies significant swelling.  Occasional swelling.  Sounds like she is eating more, more oral intake, not very active overall.  Suspect gaining weight from diet.  At last visit despite 4 pound weight gain since discharge her BNP was in the normal range.  She reports good adherence to her pulmonary vasodilators.  There is some initial confusion but it sounds like she has started Opsumit about 1 week ago.  At last visit she was only on sildenafil.  Sildenafil was changed from 20 mg 3 times daily to tadalafil 40 mg daily.  She states she has not received this medication.  Multiple pharmacy notes reviewed where both medications have been up approved.  Tadalafil should be a $0 co-pay based on pharmacy notes.  This was shared with her.  Asked daughter to call center while pharmacy work prescription was sent a few days ago to follow-up on this. ? ?In terms of response to the addition of Opsumit.  Daughter thinks that she has a bit more energy, a bit less winded.  More bright during the day.  Feels like it is helping some. ? ?Questionaires / Pulmonary Flowsheets:  ? ?ACT:  ?   ? View : No data to display.  ?  ?  ?  ? ? ? ?MMRC: ?   ? View : No data to display.  ?  ?  ?  ? ? ? ?[place]worth:  ?   ? View : No data to display.  ?  ?  ?  ? ? ? ?Tests:  ? ?FENO:  ?No  results found for: NITRICOXIDE ? ?PFT: ?   ? View : No data to display.  ?  ?  ?  ? ? ? ?WALK:  ?   ? View : No data to display.  ?  ?  ?  ? ? ? ?Imaging: ?Personally reviewed and as per EMR discussion this note ?No results found. ? ?Lab Results: ?Personally reviewed ?CBC ?   ?Component Value Date/Time  ? WBC 4.8 04/01/2021 0000  ? RBC 3.92 04/01/2021 0000  ? HGB 9.4 (L) 04/01/2021 0000  ? HCT 32.1 (L) 04/01/2021 0000  ? HCT 33.2 (L) 02/03/2019 1220  ? PLT 315 04/01/2021 0000  ? MCV 81.9 04/01/2021 0000  ? MCH 24.0 (L) 04/01/2021 0000  ? MCHC 29.3 (L) 04/01/2021 0000  ? RDW 15.8 (H) 04/01/2021 0000  ? LYMPHSABS 1.2 02/18/2021 0253  ? MONOABS 0.8 02/18/2021 0253  ? EOSABS 0.1 02/18/2021 0253  ? BASOSABS 0.0 02/18/2021 0253  ? ? ?BMET ?   ?Component Value Date/Time  ? NA 142 04/16/2021 1536  ? K 4.3 04/16/2021 1536  ? CL 93 (L) 04/16/2021 1536  ? CO2 35 (  H) 04/16/2021 1536  ? GLUCOSE 111 (H) 04/16/2021 1536  ? GLUCOSE 203 (H) 04/01/2021 0000  ? BUN 41 (H) 04/16/2021 1536  ? CREATININE 1.47 (H) 04/16/2021 1536  ? CREATININE 1.19 (H) 04/01/2021 0000  ? CALCIUM 9.1 04/16/2021 1536  ? GFRNONAA 31 (L) 02/22/2021 0432  ? GFRNONAA 43 (L) 07/07/2020 0000  ? GFRAA 50 (L) 07/07/2020 0000  ? ? ?BNP ?   ?Component Value Date/Time  ? BNP 469.6 (H) 02/06/2021 2778  ? ? ?ProBNP ?   ?Component Value Date/Time  ? PROBNP 95.0 03/31/2021 1601  ? ? ?Specialty Problems   ? ?  ? Pulmonary Problems  ? SORE THROAT  ?  Qualifier: Diagnosis of  ?By: Tomma Lightning MD, Claiborne Billings  ? ?  ?  ? Acute respiratory failure with hypoxia ([place])  ? Pleural effusion on right  ? Hypoxia  ? Acute on chronic respiratory failure with hypoxia and hypercapnia ([facility])  ? Chronic respiratory failure with hypoxia, on home oxygen therapy ([place])  ? 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[place])  ? ? ?No Known Allergies ? ?Immunization History  ?Administered Date(s) Administered  ? Influenza-Unspecified 11/03/2017  ? ? ?Past Medical History:  ?Diagnosis Date  ? Aortic atherosclerosis ([place])  10/08/2018  ? Arthritis   ? Chronic kidney disease, stage 3a ([place])   ? Chronic respiratory failure ([place])   ? Diabetes mellitus without complication ([place])   ? GERD (gastroesophageal reflux disease)   ? Hepatic steatosis 10/08/2018  ? Hyperlipidemia   ? Hypertension   ? Hyperthyroidism   ? Mass of right ovary 10/08/2018  ? 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 Mild CAD   ? Osteoporosis   ? Pneumonia   ? Renal infarct [facility])   ? SBO (small bowel obstruction) ([place]) 08/09/2018  ? Severe pulmonary hypertension ([place])   ? Stroke (cerebrum) ([place])   ? ? ?Tobacco History: ?Social History  ? ?Tobacco Use  ?Smoking Status Never  ?Smokeless Tobacco Never  ? ?Counseling given: Not Answered ? ? ?Continue to not smoke ? ?Outpatient Encounter Medications as of 04/28/2021  ?Medication Sig  ? acetaminophen (TYLENOL) 500 MG tablet Take 1,000 mg by mouth every 6 (six) hours as needed for fever or headache (pain).  ? albuterol (PROVENTIL) (2.5 MG/3ML) 0.083% nebulizer solution Take 2.5 mg by nebulization every 6 (six) hours as needed for wheezing or shortness of breath.  ? albuterol (VENTOLIN HFA) 108 (90 Base) MCG/ACT inhaler Inhale 2 puffs into the lungs every 6 (six) hours as needed for wheezing or shortness of breath.  ? alendronate (FOSAMAX) 70 MG tablet Take 70 mg by mouth every Tuesday. Take with a full glass of water on an empty stomach.  ? atorvastatin (LIPITOR) 80 MG tablet Take 1 tablet (80 mg total) by mouth every evening.  ? calcium carbonate (OSCAL) 1500 (600 Ca) MG TABS tablet Take 600 mg of elemental calcium by mouth 2 (two) times daily with a meal.  ? cetirizine (ZYRTEC) 10 MG tablet Take 10 mg by mouth daily.  ? diclofenac sodium (VOLTAREN) 1 % GEL Apply 4 g topically 4 (four) times daily as needed for pain. shoulder  ? fluticasone (FLONASE) 50 MCG/ACT nasal spray Place 1 spray into both nostrils daily.  ? gabapentin (NEURONTIN) 100 MG capsule Take 100 mg by mouth at bedtime.  ? hydrALAZINE  (APRESOLINE) 25 MG tablet Take 1 tablet (25 mg total) by mouth every 8 (eight) hours.  ? melatonin 5 MG TABS Take 10  mg by mouth at bedtime.  ? methimazole (TAPAZOLE) 5 MG tablet Take 0.5 tablets (2.5 mg total) by mouth daily.  ? Multiple Vitamin (MULTIVITAMIN WITH MINERALS) TABS tablet Take 1 tablet by mouth every morning.  ? Omega-3 Fatty Acids (FISH OIL PO) Take 1 capsule by mouth every morning.  ? OXYGEN Inhale 4 L into the lungs continuous.  ? pantoprazole (PROTONIX) 40 MG tablet Take 1 tablet (40 mg total) by mouth daily at 6 (six) AM. (Patient taking differently: Take 40 mg by mouth every morning.)  ? potassium chloride SA (KLOR-CON M) 20 MEQ tablet Take 1 tablet (20 mEq total) by mouth daily.  ? tadalafil, PAH, (ADCIRCA) 20 MG tablet Take 2 tablets (40 mg total) by mouth daily.  ? torsemide 40 MG TABS Take 40 mg by mouth daily.  ? vitamin B-12 (CYANOCOBALAMIN) 1000 MCG tablet Take 1,000 mcg by mouth daily.  ? vitamin C (ASCORBIC ACID) 500 MG tablet Take 500 mg by mouth daily.  ? Vitamin D, Cholecalciferol, 25 MCG (1000 UT) TABS Take 1,000 Units by mouth daily.  ? zinc gluconate 50 MG tablet Take 50 mg by mouth daily.  ? [DISCONTINUED] carvedilol (COREG) 12.5 MG tablet Take 1 tablet (12.5 mg total) by mouth 2 (two) times daily with a meal.  ? clopidogrel (PLAVIX) 75 MG tablet Take 1 tablet (75 mg total) by mouth daily. (Patient not taking: Reported on 04/28/2021)  ? macitentan (OPSUMIT) 10 MG tablet Take 1 tablet (10 mg total) by mouth daily. (Patient not taking: Reported on 04/28/2021)  ? ?No facility-administered encounter medications on file as of 04/28/2021.  ? ? ? ?Review of Systems ? ?Review of Systems  ?N/a ?Physical Exam ? ?BP 130/74 (BP Location: Left Arm, Patient Position: Sitting, Cuff Size: Normal)   Pulse 84   Temp 98 ?F (36.7 ?C) (Oral)   Wt 162 lb 6.4 oz (73.7 kg)   SpO2 100%   BMI 32.80 kg/m?  ? ?Wt Readings from Last 5 Encounters:  ?04/28/21 162 lb 6.4 oz (73.7 kg)  ?04/07/21 159 lb 3.2 oz  (72.2 kg)  ?03/31/21 161 lb 3.2 oz (73.1 kg)  ?02/22/21 157 lb 13.6 oz (71.6 kg)  ?01/25/21 167 lb 6.4 oz (75.9 kg)  ? ? ?BMI Readings from Last 5 Encounters:  ?04/28/21 32.80 kg/m?  ?04/07/21 32.15 kg/m?  ?03/

## 2021-05-07 ENCOUNTER — Encounter: Payer: Self-pay | Admitting: Nurse Practitioner

## 2021-05-07 ENCOUNTER — Ambulatory Visit (INDEPENDENT_AMBULATORY_CARE_PROVIDER_SITE_OTHER): Payer: Medicare HMO | Admitting: Nurse Practitioner

## 2021-05-07 ENCOUNTER — Ambulatory Visit (INDEPENDENT_AMBULATORY_CARE_PROVIDER_SITE_OTHER): Payer: Medicare HMO

## 2021-05-07 ENCOUNTER — Inpatient Hospital Stay (HOSPITAL_COMMUNITY)
Admission: EM | Admit: 2021-05-07 | Discharge: 2021-05-12 | DRG: 291 | Disposition: A | Discharge status: Home Health | Payer: Medicare HMO | Attending: Internal Medicine | Admitting: Internal Medicine

## 2021-05-07 ENCOUNTER — Emergency Department (HOSPITAL_COMMUNITY): Payer: Medicare HMO

## 2021-05-07 ENCOUNTER — Other Ambulatory Visit: Payer: Self-pay

## 2021-05-07 VITALS — BP 146/88 | HR 102 | Ht 59.0 in | Wt 163.4 lb

## 2021-05-07 DIAGNOSIS — Z7983 Long term (current) use of bisphosphonates: Secondary | ICD-10-CM | POA: Diagnosis not present

## 2021-05-07 DIAGNOSIS — E1122 Type 2 diabetes mellitus with diabetic chronic kidney disease: Secondary | ICD-10-CM | POA: Diagnosis present

## 2021-05-07 DIAGNOSIS — Z9981 Dependence on supplemental oxygen: Secondary | ICD-10-CM

## 2021-05-07 DIAGNOSIS — I10 Essential (primary) hypertension: Secondary | ICD-10-CM | POA: Diagnosis present

## 2021-05-07 DIAGNOSIS — Z9841 Cataract extraction status, right eye: Secondary | ICD-10-CM | POA: Diagnosis not present

## 2021-05-07 DIAGNOSIS — E669 Obesity, unspecified: Secondary | ICD-10-CM | POA: Diagnosis present

## 2021-05-07 DIAGNOSIS — J90 Pleural effusion, not elsewhere classified: Secondary | ICD-10-CM | POA: Diagnosis present

## 2021-05-07 DIAGNOSIS — Z8673 Personal history of transient ischemic attack (TIA), and cerebral infarction without residual deficits: Secondary | ICD-10-CM

## 2021-05-07 DIAGNOSIS — M810 Age-related osteoporosis without current pathological fracture: Secondary | ICD-10-CM | POA: Diagnosis present

## 2021-05-07 DIAGNOSIS — I272 Pulmonary hypertension, unspecified: Secondary | ICD-10-CM | POA: Diagnosis not present

## 2021-05-07 DIAGNOSIS — R0609 Other forms of dyspnea: Secondary | ICD-10-CM

## 2021-05-07 DIAGNOSIS — J9690 Respiratory failure, unspecified, unspecified whether with hypoxia or hypercapnia: Principal | ICD-10-CM

## 2021-05-07 DIAGNOSIS — R590 Localized enlarged lymph nodes: Secondary | ICD-10-CM | POA: Diagnosis present

## 2021-05-07 DIAGNOSIS — N1831 Chronic kidney disease, stage 3a: Secondary | ICD-10-CM | POA: Diagnosis present

## 2021-05-07 DIAGNOSIS — J9622 Acute and chronic respiratory failure with hypercapnia: Secondary | ICD-10-CM | POA: Diagnosis not present

## 2021-05-07 DIAGNOSIS — I2781 Cor pulmonale (chronic): Secondary | ICD-10-CM | POA: Diagnosis present

## 2021-05-07 DIAGNOSIS — R0602 Shortness of breath: Secondary | ICD-10-CM

## 2021-05-07 DIAGNOSIS — I5043 Acute on chronic combined systolic (congestive) and diastolic (congestive) heart failure: Secondary | ICD-10-CM | POA: Diagnosis present

## 2021-05-07 DIAGNOSIS — I5032 Chronic diastolic (congestive) heart failure: Secondary | ICD-10-CM | POA: Diagnosis present

## 2021-05-07 DIAGNOSIS — I130 Hypertensive heart and chronic kidney disease with heart failure and stage 1 through stage 4 chronic kidney disease, or unspecified chronic kidney disease: Secondary | ICD-10-CM | POA: Diagnosis present

## 2021-05-07 DIAGNOSIS — R778 Other specified abnormalities of plasma proteins: Secondary | ICD-10-CM | POA: Diagnosis not present

## 2021-05-07 DIAGNOSIS — Z7902 Long term (current) use of antithrombotics/antiplatelets: Secondary | ICD-10-CM

## 2021-05-07 DIAGNOSIS — I5033 Acute on chronic diastolic (congestive) heart failure: Secondary | ICD-10-CM | POA: Diagnosis present

## 2021-05-07 DIAGNOSIS — K219 Gastro-esophageal reflux disease without esophagitis: Secondary | ICD-10-CM | POA: Diagnosis present

## 2021-05-07 DIAGNOSIS — E0590 Thyrotoxicosis, unspecified without thyrotoxic crisis or storm: Secondary | ICD-10-CM | POA: Diagnosis present

## 2021-05-07 DIAGNOSIS — N179 Acute kidney failure, unspecified: Secondary | ICD-10-CM | POA: Diagnosis not present

## 2021-05-07 DIAGNOSIS — Z79899 Other long term (current) drug therapy: Secondary | ICD-10-CM | POA: Diagnosis not present

## 2021-05-07 DIAGNOSIS — I700 Atherosclerosis of aorta: Secondary | ICD-10-CM | POA: Diagnosis present

## 2021-05-07 DIAGNOSIS — E785 Hyperlipidemia, unspecified: Secondary | ICD-10-CM | POA: Diagnosis present

## 2021-05-07 DIAGNOSIS — J9809 Other diseases of bronchus, not elsewhere classified: Secondary | ICD-10-CM | POA: Diagnosis present

## 2021-05-07 DIAGNOSIS — J9621 Acute and chronic respiratory failure with hypoxia: Secondary | ICD-10-CM | POA: Diagnosis not present

## 2021-05-07 DIAGNOSIS — I3139 Other pericardial effusion (noninflammatory): Secondary | ICD-10-CM | POA: Diagnosis present

## 2021-05-07 DIAGNOSIS — J9611 Chronic respiratory failure with hypoxia: Secondary | ICD-10-CM | POA: Diagnosis not present

## 2021-05-07 DIAGNOSIS — E878 Other disorders of electrolyte and fluid balance, not elsewhere classified: Secondary | ICD-10-CM | POA: Diagnosis present

## 2021-05-07 DIAGNOSIS — Z9842 Cataract extraction status, left eye: Secondary | ICD-10-CM

## 2021-05-07 DIAGNOSIS — Z961 Presence of intraocular lens: Secondary | ICD-10-CM | POA: Diagnosis present

## 2021-05-07 DIAGNOSIS — Z6831 Body mass index (BMI) 31.0-31.9, adult: Secondary | ICD-10-CM

## 2021-05-07 DIAGNOSIS — I2510 Atherosclerotic heart disease of native coronary artery without angina pectoris: Secondary | ICD-10-CM | POA: Diagnosis present

## 2021-05-07 LAB — COMPREHENSIVE METABOLIC PANEL
ALT: 13 U/L (ref 0–44)
AST: 18 U/L (ref 15–41)
Albumin: 3.6 g/dL (ref 3.5–5.0)
Alkaline Phosphatase: 37 U/L — ABNORMAL LOW (ref 38–126)
Anion gap: 12 (ref 5–15)
BUN: 30 mg/dL — ABNORMAL HIGH (ref 8–23)
CO2: 33 mmol/L — ABNORMAL HIGH (ref 22–32)
Calcium: 9 mg/dL (ref 8.9–10.3)
Chloride: 90 mmol/L — ABNORMAL LOW (ref 98–111)
Creatinine, Ser: 1.3 mg/dL — ABNORMAL HIGH (ref 0.44–1.00)
GFR, Estimated: 42 mL/min — ABNORMAL LOW (ref >60)
Glucose, Bld: 108 mg/dL — ABNORMAL HIGH (ref 70–99)
Potassium: 4.2 mmol/L (ref 3.5–5.1)
Sodium: 135 mmol/L (ref 135–145)
Total Bilirubin: 0.5 mg/dL (ref 0.3–1.2)
Total Protein: 6.9 g/dL (ref 6.5–8.1)

## 2021-05-07 LAB — CBC
HCT: 31.8 % — ABNORMAL LOW (ref 36.0–46.0)
Hemoglobin: 9.6 g/dL — ABNORMAL LOW (ref 12.0–15.0)
MCH: 24.9 pg — ABNORMAL LOW (ref 26.0–34.0)
MCHC: 30.2 g/dL (ref 30.0–36.0)
MCV: 82.6 fL (ref 80.0–100.0)
Platelets: 206 10*3/uL (ref 150–400)
RBC: 3.85 MIL/uL — ABNORMAL LOW (ref 3.87–5.11)
RDW: 18.8 % — ABNORMAL HIGH (ref 11.5–15.5)
WBC: 4.9 10*3/uL (ref 4.0–10.5)
nRBC: 0 % (ref 0.0–0.2)

## 2021-05-07 LAB — TROPONIN I (HIGH SENSITIVITY)
Troponin I (High Sensitivity): 86 ng/L — ABNORMAL HIGH (ref <18)
Troponin I (High Sensitivity): 85 ng/L — ABNORMAL HIGH (ref <18)

## 2021-05-07 LAB — MAGNESIUM: Magnesium: 2 mg/dL (ref 1.7–2.4)

## 2021-05-07 LAB — BRAIN NATRIURETIC PEPTIDE: B Natriuretic Peptide: 158.8 pg/mL — ABNORMAL HIGH (ref 0.0–100.0)

## 2021-05-07 MED ORDER — FUROSEMIDE 10 MG/ML IJ SOLN
40.0000 mg | Freq: Two times a day (BID) | INTRAMUSCULAR | Status: DC
  Administered 2021-05-08 (×2): 40 mg via INTRAVENOUS
  Filled 2021-05-07 (×2): qty 4

## 2021-05-07 MED ORDER — VANCOMYCIN HCL IN DEXTROSE 1-5 GM/200ML-% IV SOLN
1000.0000 mg | Freq: Once | INTRAVENOUS | Status: AC
  Administered 2021-05-07: 1000 mg via INTRAVENOUS
  Filled 2021-05-07: qty 200

## 2021-05-07 MED ORDER — FUROSEMIDE 10 MG/ML IJ SOLN
60.0000 mg | Freq: Every day | INTRAMUSCULAR | Status: DC
  Administered 2021-05-07: 60 mg via INTRAVENOUS
  Filled 2021-05-07: qty 6

## 2021-05-07 MED ORDER — ENOXAPARIN SODIUM 40 MG/0.4ML IJ SOSY
40.0000 mg | PREFILLED_SYRINGE | INTRAMUSCULAR | Status: DC
  Administered 2021-05-07 – 2021-05-08 (×2): 40 mg via SUBCUTANEOUS
  Filled 2021-05-07 (×2): qty 0.4

## 2021-05-07 MED ORDER — CEFAZOLIN SODIUM-DEXTROSE 1-4 GM/50ML-% IV SOLN
1.0000 g | Freq: Three times a day (TID) | INTRAVENOUS | Status: DC
  Filled 2021-05-07: qty 50

## 2021-05-07 MED ORDER — IOHEXOL 350 MG/ML SOLN
75.0000 mL | Freq: Once | INTRAVENOUS | Status: AC | PRN
  Administered 2021-05-07: 75 mL via INTRAVENOUS

## 2021-05-07 MED ORDER — SODIUM CHLORIDE 0.9 % IV SOLN
2.0000 g | Freq: Once | INTRAVENOUS | Status: AC
  Administered 2021-05-07: 2 g via INTRAVENOUS
  Filled 2021-05-07: qty 12.5

## 2021-05-07 NOTE — ED Notes (Signed)
Pt had purewick in place but I had urinated all over herself and her clothes. Pt cleaned and changed and purewick reajusted. Meds admin per orders at this time ?

## 2021-05-07 NOTE — Assessment & Plan Note (Signed)
Severe pulm HTN with acute pulmonary edema. High risk for PE as well. Further eval in ED.  ?

## 2021-05-07 NOTE — Assessment & Plan Note (Addendum)
Progressive DOE over the last few days.  CXR with evidence of pulmonary edema.  She has already been on increased dose of torsemide to 80 mg over the past two days with worsening symptoms. High risk for decompensation.  Advised further evaluation and management in the ED.  EMS contacted to transport patient to [facility].  ED charge nurse notified.  Dr. Melvyn Novas made aware of situation as Dr. Silas Flood out of office. ?

## 2021-05-07 NOTE — ED Provider Notes (Signed)
?[facility] ?Provider Note ? ? ?CSN: [id number] ?Arrival date & time: 05/07/21  1609 ? ?  ? ?History ? ?Chief Complaint  ?Patient presents with  ? Shortness of Breath  ? ? ?Mandy [name] is a 81 y.o. female. ? ? ?Shortness of Breath ? ?Patient is 81 year old female with a history of CHF (last EF 55 to 60% on 11/22), history of CVA, hypertension, DM 2, CKD, severe pulmonary hypertension (baseline 4 L oxygen) who presents to the emergency department from the pulmonology clinic for worsening CHF.  Patient initially presented to the pulmonology clinic for 3 days of progressively worsening shortness of breath and exertional dyspnea.  Per daughter patient has been having exertional dyspnea that is worse for the past 3 days.  That is why they went to the pulmonology clinic.  She denies associated fever, increased in cough or any report of chest pain.  Patient has not been having any diarrhea or vomiting.  She reported for the past month they have increased her oxygen requirement because of her shortness of breath and hypoxia. ? ?EMS prior to the emergency department.  No intervention prior to arrival.  Last blood pressure was in the 003 systolic. ? ?Home Medications ?Prior to Admission medications   ?Medication Sig Start Date End Date Taking? Authorizing Provider  ?acetaminophen (TYLENOL) 500 MG tablet Take 1,000 mg by mouth every 6 (six) hours as needed for fever or headache (pain).    [provider]  ?albuterol (PROVENTIL) (2.5 MG/3ML) 0.083% nebulizer solution Take 2.5 mg by nebulization every 6 (six) hours as needed for wheezing or shortness of breath.    [provider]  ?albuterol (VENTOLIN HFA) 108 (90 Base) MCG/ACT inhaler Inhale 2 puffs into the lungs every 6 (six) hours as needed for wheezing or shortness of breath. 06/30/20   Thurnell Lose, MD  ?alendronate (FOSAMAX) 70 MG tablet Take 70 mg by mouth every Tuesday. Take with a full glass of water on an  empty stomach.    [provider]  ?atorvastatin (LIPITOR) 80 MG tablet Take 1 tablet (80 mg total) by mouth every evening. 02/22/21   Shawna Clamp, MD  ?calcium carbonate (OSCAL) 1500 (600 Ca) MG TABS tablet Take 600 mg of elemental calcium by mouth 2 (two) times daily with a meal.    [provider]  ?carvedilol (COREG) 12.5 MG tablet Take 1 tablet (12.5 mg total) by mouth 2 (two) times daily with a meal. 04/28/21   Donato Heinz, MD  ?cetirizine (ZYRTEC) 10 MG tablet Take 10 mg by mouth daily. 12/31/20   [provider]  ?clopidogrel (PLAVIX) 75 MG tablet Take 1 tablet (75 mg total) by mouth daily. 12/08/20   Bonnielee Haff, MD  ?diclofenac sodium (VOLTAREN) 1 % GEL Apply 4 g topically 4 (four) times daily as needed for pain. shoulder 09/13/18   [provider]  ?fluticasone (FLONASE) 50 MCG/ACT nasal spray Place 1 spray into both nostrils daily. 02/23/21   Shawna Clamp, MD  ?gabapentin (NEURONTIN) 100 MG capsule Take 100 mg by mouth at bedtime. 07/18/18   [provider]  ?hydrALAZINE (APRESOLINE) 25 MG tablet Take 1 tablet (25 mg total) by mouth every 8 (eight) hours. 02/22/21   Shawna Clamp, MD  ?macitentan (OPSUMIT) 10 MG tablet Take 1 tablet (10 mg total) by mouth daily. 04/05/21   Hunsucker, Bonna Gains, MD  ?melatonin 5 MG TABS Take 10 mg by mouth at bedtime.    [provider]  ?  methimazole (TAPAZOLE) 5 MG tablet Take 0.5 tablets (2.5 mg total) by mouth daily. 02/04/19   Hosie Poisson, MD  ?Multiple Vitamin (MULTIVITAMIN WITH MINERALS) TABS tablet Take 1 tablet by mouth every morning.    [provider]  ?Omega-3 Fatty Acids (FISH OIL PO) Take 1 capsule by mouth every morning.    [provider]  ?OXYGEN Inhale 4 L into the lungs continuous.    [provider]  ?pantoprazole (PROTONIX) 40 MG tablet Take 1 tablet (40 mg total) by mouth daily at 6 (six) AM. ?Patient taking differently: Take 40 mg by mouth every morning.  06/30/20   Thurnell Lose, MD  ?potassium chloride SA (KLOR-CON M) 20 MEQ tablet Take 1 tablet (20 mEq total) by mouth daily. 12/08/20   Bonnielee Haff, MD  ?tadalafil, PAH, (ADCIRCA) 20 MG tablet Take 2 tablets (40 mg total) by mouth daily. 04/05/21   Hunsucker, Bonna Gains, MD  ?torsemide 40 MG TABS Take 40 mg by mouth daily. 02/23/21   Shawna Clamp, MD  ?vitamin B-12 (CYANOCOBALAMIN) 1000 MCG tablet Take 1,000 mcg by mouth daily.    [provider]  ?vitamin C (ASCORBIC ACID) 500 MG tablet Take 500 mg by mouth daily.    [provider]  ?Vitamin D, Cholecalciferol, 25 MCG (1000 UT) TABS Take 1,000 Units by mouth daily.    [provider]  ?zinc gluconate 50 MG tablet Take 50 mg by mouth daily.    [provider]  ?   ? ?Allergies    ?Patient has no known allergies.   ? ?Review of Systems   ?Review of Systems  ?Respiratory:  Positive for shortness of breath.   ? ?Physical Exam ?Updated Vital Signs ?BP (!) 172/89 (BP Location: Left Arm)   Pulse (!) 110   Temp 98.5 ?F (36.9 ?C) (Oral)   Resp (!) 27   SpO2 98%  ?Physical Exam ?Constitutional:   ?   Appearance: She is ill-appearing.  ?HENT:  ?   Head: Normocephalic.  ?Eyes:  ?   Extraocular Movements: Extraocular movements intact.  ?Cardiovascular:  ?   Rate and Rhythm: Tachycardia present.  ?Pulmonary:  ?   Effort: Respiratory distress present.  ?   Breath sounds: Examination of the right-lower field reveals wheezing and rhonchi. Examination of the left-lower field reveals wheezing and rhonchi. Wheezing and rhonchi present. No rales.  ?Chest:  ?   Chest wall: No mass.  ?Musculoskeletal:  ?   Cervical back: Normal range of motion.  ?   Right lower leg: No tenderness. Edema present.  ?   Left lower leg: No tenderness. Edema present.  ?   Comments: Very minimal edema in the lower extremity.  ?Skin: ?   General: Skin is warm.  ?Neurological:  ?   General: No focal deficit present.  ?   GCS: GCS eye subscore is 4. GCS verbal subscore  is 5. GCS motor subscore is 6.  ?   Cranial Nerves: No facial asymmetry.  ?   Sensory: Sensation is intact.  ?   Motor: No tremor or abnormal muscle tone.  ? ? ?ED Results / Procedures / Treatments   ?Labs ?(all labs ordered are listed, but only abnormal results are displayed) ?Labs Reviewed  ?CBC - Abnormal; Notable for the following components:  ?    Result Value  ? RBC 3.85 (*)   ? Hemoglobin 9.6 (*)   ? HCT 31.8 (*)   ? MCH 24.9 (*)   ?  RDW 18.8 (*)   ? All other components within normal limits  ?COMPREHENSIVE METABOLIC PANEL - Abnormal; Notable for the following components:  ? Chloride 90 (*)   ? CO2 33 (*)   ? Glucose, Bld 108 (*)   ? BUN 30 (*)   ? Creatinine, Ser 1.30 (*)   ? Alkaline Phosphatase 37 (*)   ? GFR, Estimated 42 (*)   ? All other components within normal limits  ?BRAIN NATRIURETIC PEPTIDE - Abnormal; Notable for the following components:  ? B Natriuretic Peptide 158.8 (*)   ? All other components within normal limits  ?TROPONIN I (HIGH SENSITIVITY) - Abnormal; Notable for the following components:  ? Troponin I (High Sensitivity) 86 (*)   ? All other components within normal limits  ?TROPONIN I (HIGH SENSITIVITY) - Abnormal; Notable for the following components:  ? Troponin I (High Sensitivity) 85 (*)   ? All other components within normal limits  ?MRSA NEXT GEN BY PCR, NASAL  ?CULTURE, BLOOD (ROUTINE X 2)  ?CULTURE, BLOOD (ROUTINE X 2)  ?MAGNESIUM  ?PROCALCITONIN  ? ? ?EKG ?EKG Interpretation ? ?Date/Time:  Friday May 07 2021 16:30:24 EDT ?Ventricular Rate:  96 ?PR Interval:  124 ?QRS Duration: 77 ?QT Interval:  387 ?QTC Calculation: 490 ?R Axis:   84 ?Text Interpretation: Sinus rhythm Ventricular premature complex Aberrant complex Borderline right axis deviation Borderline prolonged QT interval ST and TW abnormality improved in comparison to previous ECG in February Confirmed by Gareth Morgan [id number]) on 05/07/2021 7:32:57 PM ? ?Radiology ?DG Chest 2 View ? ?Result Date: 05/07/2021 ?CLINICAL  DATA:  Shortness of breath, severe pulmonary hypertension, history of pleural effusions, diabetes mellitus, hypertension, stage III A chronic kidney disease, stroke EXAM: CHEST - 2 VIEW COMPARISON:  02/18/2021

## 2021-05-07 NOTE — ED Notes (Signed)
Daughter [name] [phone] would like an update asap ?

## 2021-05-07 NOTE — ED Notes (Signed)
Patient transported to CT 

## 2021-05-07 NOTE — Progress Notes (Signed)
[name]  ID: Mandy [name], female    DOB: 1940/06/13, 81 y.o.   MRN: [id number] ? ?Chief Complaint  ?Patient presents with  ? Follow-up  ? ? ?Referring provider: ?[name], MD ? ?HPI: ?81 year old female, never smoker followed for severe pulmonary hypertension, chronic respiratory failure and history of pleural effusions.  She is a patient of Dr. Kavin Leech and last seen in office 04/28/2021.  Past medical history significant for hypertension, CHF, history of CVA, GERD, DM2, hypothyroid, OA, CKD stage III, HLD, obesity, anxiety. ? ?TEST/EVENTS:  ?11/30/2020 cardiac cath: EF 55 to 65%.  Mid LAD lesion is 30% stenosed, distal LAD lesion is 30% stenosed; consistent with mild obstructive CAD.  Preserved cardiac output of 6.1, index of 3.55.  Mean PASP 39 mmHg with resistance of 3.44; mean wedge pressure 18 mmHg.  LVEDP of 9 mmHg with preserved LVEF ?02/05/2021 echocardiogram: EF 60 to 65%.  Severe LVH.  G1 DD.  RV function moderately reduced and size moderately enlarged.  Severe pulmonary hypertension estimated PASP of 100 mmHg. Pericardial effusion present.  Trivial MR. Tricuspid valve regurgitation severe. ?02/14/2021 echocardiogram limited: EF 55 to 60%.  Moderate LVH.  Severely elevated PASP with pressure 61.4.  Circumferential pericardial effusion.  Moderate pleural effusion on the left.  Trivial MR.  Moderate TR. ? ?03/31/2021: OV with Dr. Silas Flood for initial consult.  Previously saw Dr. Valeta Harms then was recently hospitalized for volume overload, pulmonary hypertension and acute on chronic respiratory failure.  Found to have right-sided pleural effusion and hydropneumothorax.  Reported feeling some better at hospital follow-up.  Right heart cath revealed pulmonary hypertension.  Started on sildenafil in the hospital; did feel like this was helping some.  Advised switching to tadalafil for ease of administration and start Opsumit 10 mg daily.  Continued on current diuretic regimen.  Continue supplemental  oxygen 4 L/min.  Labs unremarkable with normal BNP.  Follow-up 4 weeks ? ?04/28/2021: OV with Dr. Silas Flood.  Reported that she was doing okay overall.  She was 5 pounds up since discharge from the hospital in February.  Does have occasional swelling but nothing significant.  Suspected that weight gain was mostly from diet given she was eating more and was not very active.  Reported that she had yet to receive tadalafil so she was still on sildenafil.  Tadalafil had been approved and was $0 co-pay on pharmacy notes.  Advised daughter to call [facility] to follow-up on prescription.  Family did feel like she had a little bit more energy after starting on Opsumit.  Advised to monitor weight slowly.  Continue supplemental 4 L/min O2. ? ?05/07/2021: Today - acute visit ?Patient presents today with daughter for worsening shortness of breath over the last 3 days.  Daughter translates for patient and helps with history. They report that she has been unable to do much and gets winded even when she is getting up to go to the bathroom.  She has had a minimal cough but it is dry.  She has not noticed any worsening in her leg swelling and weights have been relatively stable.  She denies hemoptysis, calf pain, fevers, frothy sputum. She is normally on torsemide 40 mg daily but her daughter has increased her to 80 mg the past two days. She continues on Opsumit and sildenafil. Has yet to pick up tadalafil from the pharmacy.  They did get notified today that it should be ready for them to get.  She has had some yellowing to her eyes per her  family.  Denies any clay colored stool, dark urine or yellowing of the skin.  No abdominal tenderness.  She is on 4 L supplemental O2 at baseline and sats have maintained in the 90s on this. ? ?No Known Allergies ? ?Immunization History  ?Administered Date(s) Administered  ? Influenza-Unspecified 11/03/2017  ? ? ?Past Medical History:  ?Diagnosis Date  ? Aortic atherosclerosis ([place])  10/08/2018  ? Arthritis   ? Chronic kidney disease, stage 3a ([place])   ? Chronic respiratory failure ([place])   ? Diabetes mellitus without complication ([place])   ? GERD (gastroesophageal reflux disease)   ? Hepatic steatosis 10/08/2018  ? Hyperlipidemia   ? Hypertension   ? Hyperthyroidism   ? Mass of right ovary 10/08/2018  ? 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 Mild CAD   ? Osteoporosis   ? Pneumonia   ? Renal infarct [facility])   ? SBO (small bowel obstruction) ([place]) 08/09/2018  ? Severe pulmonary hypertension ([place])   ? Stroke (cerebrum) ([place])   ? ? ?Tobacco History: ?Social History  ? ?Tobacco Use  ?Smoking Status Never  ?Smokeless Tobacco Never  ? ?Counseling given: Not Answered ? ? ?Outpatient Medications Prior to Visit  ?Medication Sig Dispense Refill  ? acetaminophen (TYLENOL) 500 MG tablet Take 1,000 mg by mouth every 6 (six) hours as needed for fever or headache (pain).    ? albuterol (PROVENTIL) (2.5 MG/3ML) 0.083% nebulizer solution Take 2.5 mg by nebulization every 6 (six) hours as needed for wheezing or shortness of breath.    ? albuterol (VENTOLIN HFA) 108 (90 Base) MCG/ACT inhaler Inhale 2 puffs into the lungs every 6 (six) hours as needed for wheezing or shortness of breath. 18 g 0  ? alendronate (FOSAMAX) 70 MG tablet Take 70 mg by mouth every Tuesday. Take with a full glass of water on an empty stomach.    ? atorvastatin (LIPITOR) 80 MG tablet Take 1 tablet (80 mg total) by mouth every evening. 30 tablet 1  ? calcium carbonate (OSCAL) 1500 (600 Ca) MG TABS tablet Take 600 mg of elemental calcium by mouth 2 (two) times daily with a meal.    ? carvedilol (COREG) 12.5 MG tablet Take 1 tablet (12.5 mg total) by mouth 2 (two) times daily with a meal. 180 tablet 1  ? cetirizine (ZYRTEC) 10 MG tablet Take 10 mg by mouth daily.    ? clopidogrel (PLAVIX) 75 MG tablet Take 1 tablet (75 mg total) by mouth daily. 30 tablet 2  ? diclofenac sodium (VOLTAREN) 1 % GEL Apply 4 g topically 4  (four) times daily as needed for pain. shoulder    ? fluticasone (FLONASE) 50 MCG/ACT nasal spray Place 1 spray into both nostrils daily. 1 mL 2  ? gabapentin (NEURONTIN) 100 MG capsule Take 100 mg by mouth at bedtime.    ? hydrALAZINE (APRESOLINE) 25 MG tablet Take 1 tablet (25 mg total) by mouth every 8 (eight) hours. 90 tablet 1  ? macitentan (OPSUMIT) 10 MG tablet Take 1 tablet (10 mg total) by mouth daily. 30 tablet 5  ? melatonin 5 MG TABS Take 10 mg by mouth at bedtime.    ? methimazole (TAPAZOLE) 5 MG tablet Take 0.5 tablets (2.5 mg total) by mouth daily. 30 tablet 0  ? Multiple Vitamin (MULTIVITAMIN WITH MINERALS) TABS tablet Take 1 tablet by mouth every morning.    ? Omega-3 Fatty Acids (FISH OIL PO) Take 1 capsule by mouth every  morning.    ? OXYGEN Inhale 4 L into the lungs continuous.    ? pantoprazole (PROTONIX) 40 MG tablet Take 1 tablet (40 mg total) by mouth daily at 6 (six) AM. (Patient taking differently: Take 40 mg by mouth every morning.) 30 tablet 0  ? potassium chloride SA (KLOR-CON M) 20 MEQ tablet Take 1 tablet (20 mEq total) by mouth daily. 30 tablet 1  ? tadalafil, PAH, (ADCIRCA) 20 MG tablet Take 2 tablets (40 mg total) by mouth daily. 60 tablet 5  ? torsemide 40 MG TABS Take 40 mg by mouth daily. 30 tablet 1  ? vitamin B-12 (CYANOCOBALAMIN) 1000 MCG tablet Take 1,000 mcg by mouth daily.    ? vitamin C (ASCORBIC ACID) 500 MG tablet Take 500 mg by mouth daily.    ? Vitamin D, Cholecalciferol, 25 MCG (1000 UT) TABS Take 1,000 Units by mouth daily.    ? zinc gluconate 50 MG tablet Take 50 mg by mouth daily.    ? ?No facility-administered medications prior to visit.  ? ? ? ?Review of Systems:  ? ?Constitutional: No weight loss or gain, night sweats, fevers, chills. +fatigue ?HEENT: No headaches, difficulty swallowing, tooth/dental problems, or sore throat. No sneezing, itching, ear ache, nasal congestion, or post nasal drip. +yellowing of the eyes ?CV:  No chest pain, orthopnea, PND, swelling  in lower extremities, anasarca, dizziness, palpitations, syncope ?Resp: +shortness of breath at rest and with minimal activity; dry cough. No excess mucus or change in color of mucus. No productive or non-productiv

## 2021-05-07 NOTE — Assessment & Plan Note (Addendum)
Prior baseline creatinine 1.14.   ?Creatinine fluctuated during this hospital stay.  Stable at the time of discharge. ?

## 2021-05-07 NOTE — Assessment & Plan Note (Signed)
Sats stable at rest on 4 lpm in low to mid 90's currently.  ?

## 2021-05-07 NOTE — Assessment & Plan Note (Signed)
- 

## 2021-05-07 NOTE — Assessment & Plan Note (Addendum)
Due to decompensated cor pulmonale, pulmonary hypertension, tracheobronchomalacia, chronic right pleural effusions and chronic diastolic CHF. ?At baseline on home oxygen 4 L/min.  Patient was requiring 6 L of oxygen nasal cannula.  She was slowly weaned down to 4 L.  Pulmonology will try to arrange CPAP or BiPAP for home.  This will be done in the outpatient setting. ?

## 2021-05-07 NOTE — ED Notes (Signed)
Pt called out for c/o worsening shortness of breath. Oxygen saturation 92% on baseline 4L nasal cannula. Oxygen increased to 6L on nasal cannula, pt instructed to take large breaths through her nose and out through mouth. ED providers made aware of complaint and oxygen saturation. New EKG captured by this RN and CT angio chest ordered by providers  ?

## 2021-05-07 NOTE — ED Notes (Signed)
Sent message to Kennieth Francois no response tried calling and someone answered the phone and said he was no where around ?

## 2021-05-07 NOTE — H&P (Addendum)
?History and Physical  ? ? ?Patient: Mandy [name] XFG:[id number] DOB: 22-Apr-1940 ?DOA: 05/07/2021 ?DOS: the patient was seen and examined on 05/07/2021 ?PCP: [name], MD  ?Patient coming from: Home ? ?Chief Complaint:  ?Chief Complaint  ?Patient presents with  ? Shortness of Breath  ? ?HPI: Mandy [name] is a 81 y.o. 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 ? ?Daughter who lives with her provides history over the phone has English is not patient's first language.  She has been noting increasing shortness of breath for the past several days worse with exertion along with a dry cough.  Daughter doubled her torsemide at home without relief.  Has worsening nausea but has been ongoing since start of Opsumit about 2 weeks ago. No fever.  ? ?In the ED she was afebrile, tachycardic and tachypneic requiring up to 6 L via nasal cannula.  BP also elevated up to SBP of 170/90. ?No leukocytosis with stable anemia with hemoglobin of 9.6.  Creatinine is elevated 1.3 but improved from prior of 1.47. ? ?CTA chest negative for pulmonary embolism.  Small to moderate size right pleural effusion loculated in several places.  Small left pleural effusion appears stable.  Multiple groundglass opacities seen throughout mid and upper lungs that may be infection versus pulmonary edema. ? ?She was started on vancomycin and cefepime for presumed pneumonia and hospitalist was consulted for admission. ?Review of Systems: unable to review all systems due to the inability of the patient to answer questions. ?Past Medical History:  ?Diagnosis Date  ? Aortic atherosclerosis ([place]) 10/08/2018  ? Arthritis   ? Chronic kidney disease, stage 3a ([place])   ? Chronic respiratory failure ([place])   ? Diabetes mellitus without  complication ([place])   ? GERD (gastroesophageal reflux disease)   ? Hepatic steatosis 10/08/2018  ? Hyperlipidemia   ? Hypertension   ? Hyperthyroidism   ? Mass of right ovary 10/08/2018  ? 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 Mild CAD   ? Osteoporosis   ? Pneumonia   ? 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 SBO (small bowel obstruction) ([place]) 08/09/2018  ? Severe pulmonary hypertension ([place])   ? Stroke (cerebrum) ([place])   ? ?Past Surgical History:  ?Procedure Laterality Date  ? CATARACT EXTRACTION W/ INTRAOCULAR LENS  IMPLANT, BILATERAL    ? COLONOSCOPY    ? LEFT HEART CATH AND CORONARY ANGIOGRAPHY N/A 11/30/2020  ? Procedure: LEFT HEART CATH AND CORONARY ANGIOGRAPHY;  Surgeon: Early Osmond, MD;  Location: [facility];  Service: Cardiovascular;  Laterality: N/A;  ? RIGHT/LEFT HEART CATH AND CORONARY ANGIOGRAPHY N/A 11/30/2020  ? Procedure: RIGHT/LEFT HEART CATH AND CORONARY ANGIOGRAPHY;  Surgeon: Early Osmond, MD;  Location: [facility];  Service: Cardiovascular;  Laterality: N/A;  ? ?Social History:  reports that she has never smoked. She has never used smokeless tobacco. She reports that she does not drink alcohol and does not use drugs. ? ?No Known Allergies ? ?Family History  ?Problem Relation Age of Onset  ? Kidney disease Son   ? Colon cancer Neg Hx   ? Breast cancer Neg Hx   ? ? ?Prior to Admission medications   ?Medication Sig Start Date End Date Taking? Authorizing Provider  ?acetaminophen (TYLENOL) 500 MG tablet Take 1,000 mg by  mouth every 6 (six) hours as needed for fever or headache (pain).    [provider]  ?albuterol (PROVENTIL) (2.5 MG/3ML) 0.083% nebulizer solution Take 2.5 mg by nebulization every 6 (six) hours as needed for wheezing or shortness of breath.    [provider]  ?albuterol (VENTOLIN HFA) 108 (90 Base) MCG/ACT inhaler Inhale 2 puffs into the lungs every 6 (six) hours as needed for wheezing or shortness of breath. 06/30/20   Thurnell Lose, MD  ?alendronate (FOSAMAX) 70 MG tablet Take 70 mg by mouth every Tuesday. Take with a full glass of water on an empty stomach.    [provider]  ?atorvastatin (LIPITOR) 80 MG tablet Take 1 tablet (80 mg total) by mouth every evening. 02/22/21   Shawna Clamp, MD  ?calcium carbonate (OSCAL) 1500 (600 Ca) MG TABS tablet Take 600 mg of elemental calcium by mouth 2 (two) times daily with a meal.    [provider]  ?carvedilol (COREG) 12.5 MG tablet Take 1 tablet (12.5 mg total) by mouth 2 (two) times daily with a meal. 04/28/21   Donato Heinz, MD  ?cetirizine (ZYRTEC) 10 MG tablet Take 10 mg by mouth daily. 12/31/20   [provider]  ?clopidogrel (PLAVIX) 75 MG tablet Take 1 tablet (75 mg total) by mouth daily. 12/08/20   Bonnielee Haff, MD  ?diclofenac sodium (VOLTAREN) 1 % GEL Apply 4 g topically 4 (four) times daily as needed for pain. shoulder 09/13/18   [provider]  ?fluticasone (FLONASE) 50 MCG/ACT nasal spray Place 1 spray into both nostrils daily. 02/23/21   Shawna Clamp, MD  ?gabapentin (NEURONTIN) 100 MG capsule Take 100 mg by mouth at bedtime. 07/18/18   [provider]  ?hydrALAZINE (APRESOLINE) 25 MG tablet Take 1 tablet (25 mg total) by mouth every 8 (eight) hours. 02/22/21   Shawna Clamp, MD  ?macitentan (OPSUMIT) 10 MG tablet Take 1 tablet (10 mg total) by mouth daily. 04/05/21   Hunsucker, Bonna Gains, MD  ?melatonin 5 MG TABS Take 10 mg by mouth at bedtime.    [provider]  ?methimazole (TAPAZOLE) 5 MG tablet Take 0.5 tablets (2.5 mg total) by mouth daily. 02/04/19   Hosie Poisson, MD  ?Multiple Vitamin (MULTIVITAMIN WITH MINERALS) TABS tablet Take 1 tablet by mouth every morning.    [provider]  ?Omega-3 Fatty Acids (FISH OIL PO) Take 1 capsule by mouth every morning.    [provider]  ?OXYGEN Inhale 4 L into the lungs continuous.    [provider]  ?pantoprazole (PROTONIX) 40 MG tablet  Take 1 tablet (40 mg total) by mouth daily at 6 (six) AM. ?Patient taking differently: Take 40 mg by mouth every morning. 06/30/20   Thurnell Lose, MD  ?potassium chloride SA (KLOR-CON M) 20 MEQ tablet Take 1 tablet (20 mEq total) by mouth daily. 12/08/20   Bonnielee Haff, MD  ?tadalafil, PAH, (ADCIRCA) 20 MG tablet Take 2 tablets (40 mg total) by mouth daily. 04/05/21   Hunsucker, Bonna Gains, MD  ?torsemide 40 MG TABS Take 40 mg by mouth daily. 02/23/21   Shawna Clamp, MD  ?vitamin B-12 (CYANOCOBALAMIN) 1000 MCG tablet Take 1,000 mcg by mouth daily.    [provider]  ?vitamin C (ASCORBIC ACID) 500 MG tablet Take 500 mg by mouth daily.    [provider]  ?Vitamin D, Cholecalciferol, 25 MCG (1000 UT) TABS Take 1,000 Units by mouth daily.    [provider]  ?zinc gluconate 50 MG tablet Take 50 mg by mouth daily.    [provider]  ? ? ?Physical Exam: ?Vitals:  ? 05/07/21 2013 05/07/21 2036 05/07/21 2130 05/07/21 2205  ?BP:  (!) 172/89 (!) 190/92 (!) 165/89  ?Pulse: (!) 107 (!) 110  97  ?Resp: 18 (!) 27 (!) 36 20  ?Temp:      ?TempSrc:      ?SpO2: 97% 98%  98%  ? ?Constitutional: NAD, calm, comfortable laying at approximately 20 degree incline in bed ?Eyes: lids and conjunctivae normal ?ENMT: Mucous membranes are moist.  ?Neck: normal, supple ?Respiratory: Diminished breath sounds throughout with bibasilar crackles.  Labored respiration with exertion.  No accessory muscle use.  On 6 L via nasal cannula. ?Cardiovascular: Regular rate and rhythm, no murmurs / rubs / gallops. No extremity edema.  ?Abdomen: no tenderness,Bowel sounds positive.  ?Musculoskeletal: no clubbing / cyanosis. No joint deformity upper and lower extremities.  ?Skin: no rashes, lesions, ulcers. No induration ?Neurologic: CN 2-12 grossly intact. Strength 5/5 in all 4.  ?Psychiatric: Normal judgment and insight. Alert and oriented x 3. Normal mood. ?Data Reviewed: ? ?See HPI ? ?Assessment and Plan: ?Acute on  chronic respiratory failure with hypoxia and hypercapnia (HCC) ?Secondary to recurrent located right pleural effusion, severe pulmonary HTN and diastolic CHF ?-Baseline on 4L at home. Required 6L on presentati

## 2021-05-07 NOTE — ED Triage Notes (Signed)
Pt from [facility] for eval of fluid overload. Hx pulmonary hypertension and CHF. Takes Torsemide '40mg'$  daily and daughter has already doubled to '80mg'$ . Wears 4L baseline O2 and is maintaining on same. Sob with exertion x 2 days.  ?

## 2021-05-07 NOTE — Assessment & Plan Note (Addendum)
Continue carvedilol 12.5 Mg twice daily and hydralazine 25 Mg 3 times daily. ?

## 2021-05-07 NOTE — Assessment & Plan Note (Addendum)
Multiple loculated right effusions ?Last therapeutic thoracentesis done in 02/2021 with over 700cc fluid removal ?Pulmonology follow-up appreciated.  HRCT Chest results as below appreciated.  ?They suspect that she has chronic right-sided loculated effusions and compressive atelectasis versus RLL consolidation most likely explained by chronic pulmonary hypertension and scarring post surgery.  Also given oxygen need, advanced age, holding off on biopsy/invasive work-up of RLL consolidation because pulmonology suspect that it is inflammatory/postop changes. ?No role seen for thoracentesis and concern for reaccumulation. ?

## 2021-05-08 ENCOUNTER — Inpatient Hospital Stay (HOSPITAL_COMMUNITY): Payer: Medicare HMO

## 2021-05-08 DIAGNOSIS — I5033 Acute on chronic diastolic (congestive) heart failure: Secondary | ICD-10-CM | POA: Diagnosis not present

## 2021-05-08 DIAGNOSIS — N179 Acute kidney failure, unspecified: Secondary | ICD-10-CM | POA: Diagnosis not present

## 2021-05-08 DIAGNOSIS — J9622 Acute and chronic respiratory failure with hypercapnia: Secondary | ICD-10-CM

## 2021-05-08 DIAGNOSIS — J9621 Acute and chronic respiratory failure with hypoxia: Secondary | ICD-10-CM

## 2021-05-08 DIAGNOSIS — I272 Pulmonary hypertension, unspecified: Secondary | ICD-10-CM | POA: Diagnosis not present

## 2021-05-08 DIAGNOSIS — R778 Other specified abnormalities of plasma proteins: Secondary | ICD-10-CM | POA: Diagnosis not present

## 2021-05-08 LAB — ECHOCARDIOGRAM COMPLETE
Area-P 1/2: 4.26 cm2
Calc EF: 66.1 %
Height: 59 in
S' Lateral: 2.8 cm
Single Plane A2C EF: 61.5 %
Single Plane A4C EF: 68.2 %
Weight: 2507.95 oz

## 2021-05-08 LAB — MRSA NEXT GEN BY PCR, NASAL: MRSA by PCR Next Gen: NOT DETECTED

## 2021-05-08 LAB — PROCALCITONIN: Procalcitonin: <0.1 ng/mL

## 2021-05-08 LAB — BASIC METABOLIC PANEL
Anion gap: 12 (ref 5–15)
BUN: 22 mg/dL (ref 8–23)
CO2: 34 mmol/L — ABNORMAL HIGH (ref 22–32)
Calcium: 8.7 mg/dL — ABNORMAL LOW (ref 8.9–10.3)
Chloride: 91 mmol/L — ABNORMAL LOW (ref 98–111)
Creatinine, Ser: 1 mg/dL (ref 0.44–1.00)
GFR, Estimated: 57 mL/min — ABNORMAL LOW (ref >60)
Glucose, Bld: 133 mg/dL — ABNORMAL HIGH (ref 70–99)
Potassium: 3.5 mmol/L (ref 3.5–5.1)
Sodium: 137 mmol/L (ref 135–145)

## 2021-05-08 MED ORDER — CARVEDILOL 12.5 MG PO TABS
12.5000 mg | ORAL_TABLET | Freq: Two times a day (BID) | ORAL | Status: DC
  Administered 2021-05-08 – 2021-05-12 (×9): 12.5 mg via ORAL
  Filled 2021-05-08 (×9): qty 1

## 2021-05-08 MED ORDER — ACETAMINOPHEN 325 MG PO TABS: 650.0000 mg | ORAL_TABLET | Freq: Four times a day (QID) | ORAL | Status: DC | PRN

## 2021-05-08 MED ORDER — HYDRALAZINE HCL 25 MG PO TABS
25.0000 mg | ORAL_TABLET | Freq: Three times a day (TID) | ORAL | Status: DC
  Administered 2021-05-08 – 2021-05-12 (×14): 25 mg via ORAL
  Filled 2021-05-08 (×14): qty 1

## 2021-05-08 MED ORDER — MACITENTAN 10 MG PO TABS: 10.0000 mg | ORAL_TABLET | Freq: Every day | ORAL | Status: DC

## 2021-05-08 MED ORDER — CLOPIDOGREL BISULFATE 75 MG PO TABS
75.0000 mg | ORAL_TABLET | Freq: Every day | ORAL | Status: DC
  Administered 2021-05-08 – 2021-05-12 (×5): 75 mg via ORAL
  Filled 2021-05-08 (×5): qty 1

## 2021-05-08 MED ORDER — MACITENTAN 10 MG PO TABS
10.0000 mg | ORAL_TABLET | Freq: Every day | ORAL | Status: DC
  Administered 2021-05-08 – 2021-05-12 (×5): 10 mg via ORAL
  Filled 2021-05-08 (×5): qty 1

## 2021-05-08 MED ORDER — GABAPENTIN 100 MG PO CAPS
100.0000 mg | ORAL_CAPSULE | Freq: Every day | ORAL | Status: DC
  Administered 2021-05-08 – 2021-05-11 (×4): 100 mg via ORAL
  Filled 2021-05-08 (×4): qty 1

## 2021-05-08 MED ORDER — HYDRALAZINE HCL 20 MG/ML IJ SOLN: 10.0000 mg | Freq: Three times a day (TID) | INTRAMUSCULAR | Status: DC | PRN

## 2021-05-08 MED ORDER — ATORVASTATIN CALCIUM 80 MG PO TABS
80.0000 mg | ORAL_TABLET | Freq: Every evening | ORAL | Status: DC
  Administered 2021-05-08 – 2021-05-11 (×4): 80 mg via ORAL
  Filled 2021-05-08 (×4): qty 1

## 2021-05-08 MED ORDER — METHIMAZOLE 5 MG PO TABS
2.5000 mg | ORAL_TABLET | Freq: Every day | ORAL | Status: DC
  Administered 2021-05-08 – 2021-05-12 (×5): 2.5 mg via ORAL
  Filled 2021-05-08 (×5): qty 1

## 2021-05-08 MED ORDER — SILDENAFIL CITRATE 20 MG PO TABS
20.0000 mg | ORAL_TABLET | Freq: Three times a day (TID) | ORAL | Status: DC
  Administered 2021-05-08 – 2021-05-12 (×11): 20 mg via ORAL
  Filled 2021-05-08 (×11): qty 1

## 2021-05-08 NOTE — Assessment & Plan Note (Addendum)
Continue Tapazole 2.5 Mg daily. ?No clinical hyperthyroidism. ?

## 2021-05-08 NOTE — Assessment & Plan Note (Addendum)
Decompensated cor pulmonale ?Echo 02/14/2021: LVEF 55-60%, moderate concentric LVH, circumferential pericardial effusion, moderate left pleural effusion, severe pulmonary artery hypertension. ?Treated briefly with IV Lasix.  She may continue her home dose of torsemide.  Pulmonology recommends increasing it to twice a day. ?Echo 05/08/2021: LVEF 55-60%, severe LVH, grade 2 diastolic dysfunction, severe pulmonary hypertension and small pericardial effusion. ?Continued on sildenafil and macitentan. ?Follow-up with pulmonology in the outpatient setting. ?

## 2021-05-08 NOTE — Progress Notes (Signed)
?  Echocardiogram ?2D Echocardiogram has been performed. ? ?Mandy [name] ?05/08/2021, 12:00 PM ?

## 2021-05-08 NOTE — Progress Notes (Signed)
Pharmacy Consult for Pulmonary Hypertension Treatment  ? ?Indication - Continuation of prior to admission medication  ? ?Patient is 81 y.o.  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  ? ?Continuing this medication order as an inpatient requires that monitoring parameters per REMS requirements must be met. ? ?Chronic therapy is under the supervision of Dr. Silas Flood who is enrolled in the REMS program and is being notified of continuation of therapy. A staff message in EPIC has been sent notifying the certified prescriber.  ?Per patient report has previously been educated on Hepatotoxicity . On admission pregnancy risk has been assessed and no monitoring required. ?Hepatic function has been evaluated. AST/ALT appropriate to continue medication at this time. ? ? ?  Latest Ref Rng & Units 05/07/2021  ?  4:49 PM 04/01/2021  ? 12:00 AM 03/31/2021  ?  4:01 PM  ?Hepatic Function  ?Total Protein 6.5 - 8.1 g/dL 6.9   7.2   7.2    ?Albumin 3.5 - 5.0 g/dL 3.6    4.0    ?AST 15 - 41 U/L '18   21   18    ' ?ALT 0 - 44 U/L '13   11   11    ' ?Alk Phosphatase 38 - 126 U/L 37    45    ?Total Bilirubin 0.3 - 1.2 mg/dL 0.5   0.3   0.3    ? ? ?If any question arise or pregnancy is identified during hospitalization, contact for bosentan and macitentan: [phone]; ambrisentan: [phone]. ? ?Thank for you allowing [place] to participate in the care of this patient.  ?

## 2021-05-08 NOTE — Progress Notes (Signed)
Unable to talk to patient for admission history. Could not reach daughter by phone. Daughter should return to hospital in am. ?

## 2021-05-08 NOTE — Assessment & Plan Note (Signed)
Continue atorvastatin 80 mg daily. 

## 2021-05-08 NOTE — Evaluation (Signed)
Physical Therapy Evaluation ?Patient Details ?Name: Mandy [name] ?MRN: [id number] ?DOB: 1940-06-15 ?Today's Date: 05/08/2021 ? ?History of Present Illness ? 81 yo female admitted 5/5 with acute on chronic resp failure, acute on chronic HF, bil pleural effusion, pulm HTN, elevation of troponin.  Pt is on 6L O2, baseline use is 4L O2.  PMHx of anemia, severe pulm HTN, DM, HF, CVA, CAD, CKD.  ?Clinical Impression ? Pt is up to walk with PT using O2 as ordered, in which she could maintain O2 sats and HR was not higher than 108.  Pt is motivated to move and walk but her estimation of tolerance is higher than her current strength is.  Pt required a careful finish of gait to sit quickly by 35' of gait.  Her help for walking is not that significant, and should be able to go home with her family with HHPT to continue strengthening and work on safety and endurance with gait.  Follow acutely for goals of PT. ?   ? ?Recommendations for follow up therapy are one component of a multi-disciplinary discharge planning process, led by the attending physician.  Recommendations may be updated based on patient status, additional functional criteria and insurance authorization. ? ?Follow Up Recommendations Home health PT ? ?  ?Assistance Recommended at Discharge Frequent or constant Supervision/Assistance  ?Patient can return home with the following ? A little help with walking and/or transfers;A little help with bathing/dressing/bathroom;Assistance with cooking/housework;Direct supervision/assist for medications management;Direct supervision/assist for financial management;Assist for transportation;Help with stairs or ramp for entrance ? ?  ?Equipment Recommendations Rolling walker (2 wheels)  ?Recommendations for Other Services ?    ?  ?Functional Status Assessment Patient has had a recent decline in their functional status and demonstrates the ability to make significant improvements in function in a reasonable and predictable amount of  time.  ? ?  ?Precautions / Restrictions Precautions ?Precautions: Fall ?Restrictions ?Weight Bearing Restrictions: No  ? ?  ? ?Mobility ? Bed Mobility ?Overal bed mobility: Modified Independent ?  ?  ?  ?  ?  ?  ?General bed mobility comments: extra time and HOB elevated ?  ? ?Transfers ?Overall transfer level: Needs assistance ?Equipment used: Rolling walker (2 wheels), 1 person hand held assist ?Transfers: Sit to/from Stand ?Sit to Stand: Supervision ?  ?  ?  ?  ?  ?General transfer comment: no assist to stand ?  ? ?Ambulation/Gait ?Ambulation/Gait assistance: Supervision, Min guard ?Gait Distance (Feet): 35 Feet ?Assistive device: Rolling walker (2 wheels), 1 person hand held assist ?Gait Pattern/deviations: Step-through pattern, Decreased stride length, Knees buckling, Wide base of support ?Gait velocity: reduced ?Gait velocity interpretation: <1.31 ft/sec, indicative of household ambulator ?Pre-gait activities: standing balance ck ?General Gait Details: pt becomes unsteady at end of walk and requires support briefly ? ?Stairs ?  ?  ?  ?  ?  ? ?Wheelchair Mobility ?  ? ?Modified Rankin (Stroke Patients Only) ?  ? ?  ? ?Balance Overall balance assessment: Needs assistance ?Sitting-balance support: Bilateral upper extremity supported, Feet supported ?Sitting balance-Leahy Scale: Good ?  ?  ?Standing balance support: Bilateral upper extremity supported ?Standing balance-Leahy Scale: Fair ?Standing balance comment: unsteadiness after walking a distance of 35' ?  ?  ?  ?  ?  ?  ?  ?  ?  ?  ?  ?   ? ? ? ?Pertinent Vitals/Pain Pain Assessment ?Pain Assessment: No/denies pain  ? ? ?Home Living Family/patient expects to be discharged to:: Private  residence ?Living Arrangements: Children ?Available Help at Discharge: Family;Available 24 hours/day ?Type of Home: House ?Home Access: Level entry ?  ?  ?  ?Home Layout: One level ?Home Equipment: Conservation officer, nature (2 wheels);BSC/3in1;Shower seat (RW in disrepair) ?Additional  Comments: RW is in poor condition  ?  ?Prior Function Prior Level of Function : Needs assist ?  ?  ?  ?Physical Assist : Mobility (physical) ?Mobility (physical): Gait ?  ?Mobility Comments: RW in poor condition but not falling at home ?  ?  ? ? ?Hand Dominance  ? Dominant Hand: Right ? ?  ?Extremity/Trunk Assessment  ? Upper Extremity Assessment ?Upper Extremity Assessment: Defer to OT evaluation ?  ? ?Lower Extremity Assessment ?Lower Extremity Assessment: Generalized weakness ?  ? ?Cervical / Trunk Assessment ?Cervical / Trunk Assessment: Normal  ?Communication  ? Communication: Prefers language other than English (family translates due to lack of services)  ?Cognition Arousal/Alertness: Awake/alert ?Behavior During Therapy: Impulsive ?Overall Cognitive Status: Difficult to assess ?  ?  ?  ?  ?  ?  ?  ?  ?  ?  ?  ?  ?  ?  ?  ?  ?General Comments: pt does not speak much english ?  ?  ? ?  ?General Comments General comments (skin integrity, edema, etc.): Pt is up to walk with help and controlled her balance with RW but requires cues for setting limits as her legs fatigue ? ?  ?Exercises    ? ?Assessment/Plan  ?  ?PT Assessment Patient needs continued PT services  ?PT Problem List Decreased strength;Decreased activity tolerance;Decreased balance;Decreased coordination;Decreased knowledge of use of DME;Decreased safety awareness ? ?   ?  ?PT Treatment Interventions DME instruction;Gait training;Functional mobility training;Therapeutic activities;Therapeutic exercise;Balance training;Neuromuscular re-education;Patient/family education   ? ?PT Goals (Current goals can be found in the Care Plan section)  ?Acute Rehab PT Goals ?Patient Stated Goal: to go home ?PT Goal Formulation: With patient/family ?Time For Goal Achievement: 05/22/21 ?Potential to Achieve Goals: Good ? ?  ?Frequency Min 3X/week ?  ? ? ?Co-evaluation   ?  ?  ?  ?  ? ? ?  ?AM-PAC PT "6 Clicks" Mobility  ?Outcome Measure Help needed turning from your  back to your side while in a flat bed without using bedrails?: None ?Help needed moving from lying on your back to sitting on the side of a flat bed without using bedrails?: A Little ?Help needed moving to and from a bed to a chair (including a wheelchair)?: A Little ?Help needed standing up from a chair using your arms (e.g., wheelchair or bedside chair)?: A Little ?Help needed to walk in hospital room?: A Little ?Help needed climbing 3-5 steps with a railing? : A Lot ?6 Click Score: 18 ? ?  ?End of Session Equipment Utilized During Treatment: Gait belt;Oxygen ?Activity Tolerance: Patient limited by fatigue;Treatment limited secondary to medical complications (Comment) ?Patient left: in bed;with call bell/phone within reach;with bed alarm set;with family/visitor present ?Nurse Communication: Mobility status;Precautions ?PT Visit Diagnosis: Unsteadiness on feet (R26.81);Muscle weakness (generalized) (M62.81);Difficulty in walking, not elsewhere classified (R26.2) ?  ? ?Time: [id number] ?PT Time Calculation (min) (ACUTE ONLY): 26 min ? ? ?Charges:   PT Evaluation ?$PT Eval Moderate Complexity: 1 Mod ?PT Treatments ?$Gait Training: 8-22 mins ?  ?   ? ?Ramond Dial ?05/08/2021, 4:26 PM ? ?Mee Hives, PT PhD ?Acute Rehab Dept. Number: [facility] [place] [phone] and [place] [phone] ? ? ?

## 2021-05-08 NOTE — Hospital Course (Addendum)
81 year old female, speaks [place] Creole and very little English, medical history significant for severe pulmonary hypertension, chronic respiratory failure with hypoxia and hypercapnia on home oxygen 4 L/min, chronic diastolic CHF, HTN, HLD, type II DM, CKD stage IIIa, GERD, mild CAD, CVA, hyperthyroidism on methimazole, seen at at her pulmonologist office on day of admission for progressive worsening dyspnea on exertion for past several days along with dry cough and some chest pain.  Symptoms persisted despite doubling home dose of torsemide.  Directed to ED by pulmonology.  In ED, tachycardic, tachypneic and required 6 L/min [place] oxygen.  CTA chest negative for PE but showed small to moderate size right-sided pleural effusion, loculated and multiple groundglass opacities seen throughout the mid and upper lung that may be infection versus pulmonary edema.  Admitted for acute on chronic respiratory failure with hypoxia and hypercapnia secondary to acute on chronic diastolic CHF, recurrent right pleural effusion complicating underlying severe pulmonary hypertension.  Pulmonology consulted.  Treating with IV Lasix for decompensated cor pulmonale.  Plan to DC home with CPAP at bedtime.  Possible DC home 5/10. ?

## 2021-05-08 NOTE — Progress Notes (Signed)
?PROGRESS NOTE ?  ?Mandy [name]  WUJ:[id number]    DOB: Jul 07, 1940    DOA: 05/07/2021 ? ?PCP: [name], MD  ? ?I have briefly reviewed patients previous medical [facility] in [facility]. ? ?Chief Complaint  ?Patient presents with  ? Shortness of Breath  ? ? ?Hospital Course:  ?81 year old female, speaks [place] Creole and very little English, medical history significant for severe pulmonary hypertension, chronic respiratory failure with hypoxia and hypercapnia on home oxygen 4 L/min, chronic diastolic CHF, HTN, HLD, type II DM, CKD stage IIIa, GERD, mild CAD, CVA, hyperthyroidism on methimazole, seen at at her pulmonologist office on day of admission for progressive worsening dyspnea on exertion for past several days along with dry cough and some chest pain.  Symptoms persisted despite doubling home dose of torsemide.  Directed to ED by pulmonology.  In ED, tachycardic, tachypneic and required 6 L/min  oxygen.  CTA chest negative for PE but showed small to moderate size right-sided pleural effusion, loculated and multiple groundglass opacities seen throughout the mid and upper lung that may be infection versus pulmonary edema.  Admitted for acute on chronic respiratory failure with hypoxia and hypercapnia secondary to acute on chronic diastolic CHF, recurrent right pleural effusion complicating underlying severe pulmonary hypertension.  Treating with IV Lasix.  Pulmonology consulted for assistance. ? ?Assessment and Plan: ?* Acute on chronic respiratory failure with hypoxia and hypercapnia (HCC) ?Secondary to acute on chronic diastolic CHF and recurrent right pleural effusion complicating underlying severe pulmonary hypertension. ?At baseline on home oxygen 4 L/min.  Required 6 L/min on presentation. ?Continuing IV Lasix 40 mg twice daily. ?Although CT chest mentions possibility of pneumonia, no clinical pneumonia and moreover procalcitonin negative and hence antibiotics not continued. ?Titrate oxygen  to maintain saturations between 89-92%. ? ?Acute on chronic diastolic CHF (congestive heart failure) ([place]) ?Echo 02/14/2021: LVEF 55-60%, moderate concentric LVH, circumferential pericardial effusion, moderate left pleural effusion, severe pulmonary artery hypertension. ?Since patient complained of some chest discomfort, had mildly elevated troponin albeit with a flat trend and low index of suspicion for ACS, will repeat 2D echo. ?Continue IV Lasix 40 mg every 12 hours daily, strict intake output and daily weights. ? ?Acute renal failure superimposed on stage 3a chronic kidney disease ([place]) ?Prior baseline creatinine 1.14.  Presented with creatinine of 1.3. ?Improved after IV Lasix. ?Follow BMP while on IV Lasix.  ? ?Recurrent right pleural effusion ?Multiple loculated right effusions ?Last therapeutic thoracentesis done in 02/2021 with over 700cc fluid removal ?Although IR was initially consulted for ultrasound-guided thoracentesis, given multiloculated pleural effusion, not sure if all will be accessible.  Thereby consulted pulmonology.  Input pending. ? ?Severe pulmonary hypertension ([place]) ?Continue Opsumit. ?Recently switched from sildenafil to [facility] outpatient but had not yet started, started Taladafil  ?Follows with Dr. Silas Flood, pulmonology. ? ?Essential hypertension ?Continue carvedilol 12.5 Mg twice daily and hydralazine 25 Mg 3 times daily. ?Uncontrolled.  Added as needed IV hydralazine. ? ?Dyslipidemia ?Continue atorvastatin 80 mg daily. ? ?Hyperthyroidism ?Continue Tapazole 2.5 Mg daily. ? ?History of stroke ?Continue Plavix and statin ? ?Elevated troponin ?Suspect due to demand ischemia from acute respiratory failure and decompensated CHF. ?Continue carvedilol and Plavix. ?As needed sublingual NTG. ?Follow-up 2D echo, if okay then no further evaluation, if shows low EF or wall motion abnormalities, then consult cardiology. ? ? ?Body mass index is 31.66 kg/m?. ? ? ?DVT prophylaxis: enoxaparin  (LOVENOX) injection 40 mg Start: 05/07/21 2245   ?  Code Status: Full Code:  ?  Family Communication: Discussed in detail with patient's daughter via phone, updated care and answered all questions.  She speaks [place]. ?Disposition:  ?Status is: Inpatient ?Remains inpatient appropriate because: IV Lasix, may need thoracentesis. ?  ? ?Consultants:   ?Pulmonology ? ?Procedures:   ?None ? ?Antimicrobials:   ?Not continued after admission. ? ?Subjective:  ?Patient speaks very limited [place].  Unable to find video interpreter that speaks her language.  Finally discussed with patient's daughter via phone while she was visiting her mother in the hospital.  She reports that dyspnea significantly improved but not completely resolved.  No further chest pain that she had yesterday.  Overall feels weak. ? ?Objective:  ? ?Vitals:  ? 05/08/21 0000 05/08/21 0031 05/08/21 0510 05/08/21 1154  ?BP: (!) 206/83 (!) 198/89 (!) 178/63 (!) 155/82  ?Pulse:   96 94  ?Resp: (!) 23 (!) 25 (!) 24 20  ?Temp:  98.5 ?F (36.9 ?C) 98.6 ?F (37 ?C) 98.5 ?F (36.9 ?C)  ?TempSrc:  Oral Oral Oral  ?SpO2:   98% 93%  ?Weight:  71.1 kg    ?Height:  '4\' 11"'$  (1.499 m)    ? ? ?General exam: Elderly female, moderately built and obese lying comfortably propped up in bed without distress. ?Respiratory system: Clear to auscultation anteriorly.  Reduced breath sounds posteriorly, right >>left.  Occasional left basal crackles.  No increased work of breathing. ?Cardiovascular system: S1 & S2 heard, RRR. No JVD, murmurs, rubs, gallops or clicks.  Trace bilateral ankle edema.  Telemetry personally reviewed: Sinus rhythm. ?Gastrointestinal system: Abdomen is nondistended, soft and nontender. No organomegaly or masses felt. Normal bowel sounds heard. ?Central nervous system: Alert and oriented. No focal neurological deficits. ?Extremities: Symmetric 5 x 5 power. ?Skin: No rashes, lesions or ulcers ?Psychiatry: Judgement and insight appear normal. Mood & affect appropriate.   ? ?Data Reviewed:   ?I have personally reviewed following labs and imaging studies ? ?CBC: ?Recent Labs  ?Lab 05/07/21 ?1649  ?WBC 4.9  ?HGB 9.6*  ?HCT 31.8*  ?MCV 82.6  ?PLT 206  ? ?Basic Metabolic Panel: ?Recent Labs  ?Lab 05/07/21 ?1649 05/08/21 ?0119  ?NA 135 137  ?K 4.2 3.5  ?CL 90* 91*  ?CO2 33* 34*  ?GLUCOSE 108* 133*  ?BUN 30* 22  ?CREATININE 1.30* 1.00  ?CALCIUM 9.0 8.7*  ?MG 2.0  --   ? ?Liver Function Tests: ?Recent Labs  ?Lab 05/07/21 ?1649  ?AST 18  ?ALT 13  ?ALKPHOS 37*  ?BILITOT 0.5  ?PROT 6.9  ?ALBUMIN 3.6  ? ?CBG: ?No results for input(s): GLUCAP in the last 168 hours. ?Microbiology Studies:  ? ?Recent Results (from the past 240 hour(s))  ?MRSA Next Gen by PCR, Nasal     Status: None  ? Collection Time: 05/07/21  8:58 PM  ? Specimen: Nasal Mucosa; Nasal Swab  ?Result Value Ref Range Status  ? MRSA by PCR Next Gen NOT DETECTED NOT DETECTED Final  ?  Comment: (NOTE) ?The GeneXpert MRSA Assay (FDA approved for NASAL specimens only), ?is one component of a comprehensive MRSA colonization surveillance ?program. It is not intended to diagnose MRSA infection nor to guide ?or monitor treatment for MRSA infections. ?Test performance is not FDA approved in patients less than 2 years ?old. ?Performed at [facility], [place] [address] [place], [place] ?[address] ?  ?Blood culture (routine x 2)     Status: None (Preliminary result)  ? Collection Time: 05/07/21  9:23 PM  ? Specimen: BLOOD RIGHT HAND  ?  Result Value Ref Range Status  ? Specimen Description BLOOD RIGHT HAND  Final  ? Special Requests   Final  ?  BOTTLES DRAWN AEROBIC AND ANAEROBIC Blood Culture adequate volume  ? Culture   Final  ?  NO GROWTH < 12 HOURS ?Performed at [facility], [place] [address] [place], [place] [address] ?  ? Report Status PENDING  Incomplete  ? ? ?Radiology Studies:  ?DG Chest 2 View ? ?Result Date: 05/07/2021 ?CLINICAL DATA:  Shortness of breath, severe pulmonary hypertension, history of pleural effusions, diabetes  mellitus, hypertension, stage III A chronic kidney disease, stroke EXAM: CHEST - 2 VIEW COMPARISON:  02/18/2021 FINDINGS: Enlargement of cardiac silhouette with pulmonary vascular congestion. Atherosclerotic ca

## 2021-05-08 NOTE — Consult Note (Signed)
? ?NAME:  Mandy [name], MRN:  [id number], DOB:  1940-06-02, LOS: 1 ?ADMISSION DATE:  05/07/2021, CONSULTATION DATE:  05/09/21 ?REFERRING MD:  Dr Algis Liming, CHIEF COMPLAINT:  shortness of breath, hypoxemia  ? ?History of Present Illness:  ?81 year old woman who has been followed in our office for severe pulmonary hypertension.  She is a never smoker, has history of systemic hypertension, CHF, CVA, diabetes, hypothyroidism, CKD stage III, obesity.  Pulmonary hypertension confirmed on right heart catheterization with a mean PASP 39 mmHg and PAOP 18, preserved cardiac output 6.1.  Course complicated by pleural effusions, right hydropneumothorax, thoracentesis while hospitalized.  At that time also started on sildenafil in addition to Opsumit. Has had nausea with initiation of the Opsumit.  Plans to change to tadalafil when she saw Dr. Silas Flood in follow-up 04/28/2021 she was on 4 L/min oxygen.  Diuretic regimen furosemide 40 mg twice daily, increased due to progressive shortness of breath on 05/08/2018 3 to 80 mg twice daily.  Chest x-ray suggestive of progressive pulmonary edema.  She was directed to the ED and admitted to the hospital on 5/5. ? ?CT-PA 5/5 reviewed, showed no evidence of PE, smaller partially loculated moderate right pleural effusion, stable small left pleural effusion, bilateral groundglass and septal thickening most consistent with pulmonary edema.  Stable cardiomegaly. ? ?SPO2 has improved, now 100% on 6 L/min nasal cannula ? ? ?Pertinent  Medical History  ? ?Past Medical History:  ?Diagnosis Date  ? Aortic atherosclerosis ([place]) 10/08/2018  ? Arthritis   ? Chronic kidney disease, stage 3a ([place])   ? Chronic respiratory failure ([place])   ? Diabetes mellitus without complication ([place])   ? GERD (gastroesophageal reflux disease)   ? Hepatic steatosis 10/08/2018  ? Hyperlipidemia   ? Hypertension   ? Hyperthyroidism   ? Mass of right ovary 10/08/2018  ? 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 Mild CAD   ? Osteoporosis   ? Pneumonia   ? Renal infarct [facility])   ? SBO (small bowel obstruction) ([place]) 08/09/2018  ? Severe pulmonary hypertension ([place])   ? Stroke (cerebrum) ([place])   ? ? ?Significant Hospital Events: ?Including procedures, antibiotic start and stop dates in addition to other pertinent events   ?CT-PA 5/5 ? ?Interim History / Subjective:  ? ? ?Objective   ?Blood pressure (!) 164/74, pulse 97, temperature 98.5 ?F (36.9 ?C), temperature source Oral, resp. rate 18, height '4\' 11"'$  (1.499 m), weight 71.1 kg, SpO2 100 %. ?   ?   ? ?Intake/Output Summary (Last 24 hours) at 05/08/2021 1636 ?Last data filed at 05/08/2021 0600 ?Gross per 24 hour  ?Intake 324.67 ml  ?Output 500 ml  ?Net -175.33 ml  ? ?Filed Weights  ? 05/08/21 0031  ?Weight: 71.1 kg  ? ? ?Examination: ?General: Elderly woman laying in bed comfortable on nasal cannula O2 ?HENT: Oropharynx clear, no stridor ?Lungs: Decreased to both bases, few crackles on the right greater than left ?Cardiovascular: Regular, distant, no murmur.  She does have an S4 ?Abdomen: Obese, nondistended positive bowel sounds ?Extremities: Trace lower extremity edema  ?Neuro: Awake, alert, interacts with daughter.  Does not speak [place]. ?GU: Deferred ? ?Resolved Hospital Problem list   ? ? ?Assessment & Plan:  ?Acute on chronic hypoxemic respiratory failure.  Appears to be due to her PAH, pulmonary edema.  No clear evidence to support pneumonia.  Agree with staying off antibiotics ? ?Pulmonary Hypertension, multifactorial with R and L heart dysfxn, acute  cardiogenic pulmonary edema ?Continue Opsumit, restart sildenafil ?Agree with aggressive diuresis as blood pressure and renal function will tolerate ? ?Bilateral R>L pleural effusions.  ?No clear benefit at this point to tapping her pleural effusion.  It would likely quickly reaccumulate.  Prefer aggressive diuresis ? ? ? ?Labs   ?CBC: ?Recent Labs  ?Lab 05/07/21 ?1649  ?WBC 4.9  ?HGB 9.6*  ?HCT 31.8*  ?MCV 82.6   ?PLT 206  ? ? ?Basic Metabolic Panel: ?Recent Labs  ?Lab 05/07/21 ?1649 05/08/21 ?0119  ?NA 135 137  ?K 4.2 3.5  ?CL 90* 91*  ?CO2 33* 34*  ?GLUCOSE 108* 133*  ?BUN 30* 22  ?CREATININE 1.30* 1.00  ?CALCIUM 9.0 8.7*  ?MG 2.0  --   ? ?GFR: ?Estimated Creatinine Clearance: 38.5 mL/min (by C-G formula based on SCr of 1 mg/dL). ?Recent Labs  ?Lab 05/07/21 ?1649 05/08/21 ?0119  ?PROCALCITON  --  <0.10  ?WBC 4.9  --   ? ? ?Liver Function Tests: ?Recent Labs  ?Lab 05/07/21 ?1649  ?AST 18  ?ALT 13  ?ALKPHOS 37*  ?BILITOT 0.5  ?PROT 6.9  ?ALBUMIN 3.6  ? ?No results for input(s): LIPASE, AMYLASE in the last 168 hours. ?No results for input(s): AMMONIA in the last 168 hours. ? ?ABG ?   ?Component Value Date/Time  ? PHART 7.381 02/10/2021 0855  ? PCO2ART 76.0 ([place]) 02/10/2021 0855  ? PO2ART 91.2 02/10/2021 0855  ? HCO3 44.0 (H) 02/10/2021 0855  ? TCO2 47 (H) 11/30/2020 1632  ? O2SAT 96.5 02/22/2021 0621  ?  ? ?Coagulation Profile: ?No results for input(s): INR, PROTIME in the last 168 hours. ? ?Cardiac Enzymes: ?No results for input(s): CKTOTAL, CKMB, CKMBINDEX, TROPONINI in the last 168 hours. ? ?HbA1C: ?Hgb A1c MFr Bld  ?Date/Time Value Ref Range Status  ?12/07/2020 01:44 AM 5.7 (H) 4.8 - 5.6 % Final  ?  Comment:  ?  (NOTE) ?Pre diabetes:          5.7%-6.4% ? ?Diabetes:              >6.4% ? ?Glycemic control for   <7.0% ?adults with diabetes ?  ?11/03/2020 06:43 PM 5.9 (H) 4.8 - 5.6 % Final  ?  Comment:  ?  (NOTE) ?Pre diabetes:          5.7%-6.4% ? ?Diabetes:              >6.4% ? ?Glycemic control for   <7.0% ?adults with diabetes ?  ? ? ?CBG: ?No results for input(s): GLUCAP in the last 168 hours. ? ?Review of Systems:   ?As per HPI ? ?Past Medical History:  ?She,  has a past medical history of Aortic atherosclerosis ([place]) (10/08/2018), Arthritis, Chronic kidney disease, stage 3a ([place]), Chronic respiratory failure ([place]), Diabetes mellitus without complication ([place]), GERD (gastroesophageal reflux disease), Hepatic steatosis  (10/08/2018), Hyperlipidemia, Hypertension, Hyperthyroidism, Mass of right ovary (10/08/2018), Mediastinal mass, Mild CAD, Osteoporosis, Pneumonia, Renal infarct ([place]), SBO (small bowel obstruction) ([place]) (08/09/2018), Severe pulmonary hypertension ([place]), and Stroke (cerebrum) ([place]).  ? ?Surgical History:  ? ?Past Surgical History:  ?Procedure Laterality Date  ? CATARACT EXTRACTION W/ INTRAOCULAR LENS  IMPLANT, BILATERAL    ? COLONOSCOPY    ? LEFT HEART CATH AND CORONARY ANGIOGRAPHY N/A 11/30/2020  ? Procedure: LEFT HEART CATH AND CORONARY ANGIOGRAPHY;  Surgeon: Early Osmond, MD;  Location: [place] [facility];  Service: Cardiovascular;  Laterality: N/A;  ? RIGHT/LEFT HEART CATH AND CORONARY ANGIOGRAPHY N/A 11/30/2020  ? Procedure:  RIGHT/LEFT HEART CATH AND CORONARY ANGIOGRAPHY;  Surgeon: Early Osmond, MD;  Location: [place] [facility];  Service: Cardiovascular;  Laterality: N/A;  ?  ? ?Social History:  ? reports that she has never smoked. She has never used smokeless tobacco. She reports that she does not drink alcohol and does not use drugs.  ? ?Family History:  ?Her family history includes Kidney disease in her son. There is no history of Colon cancer or Breast cancer.  ? ?Allergies ?No Known Allergies  ? ?Home Medications  ?Prior to Admission medications   ?Medication Sig Start Date End Date Taking? Authorizing Provider  ?acetaminophen (TYLENOL) 500 MG tablet Take 1,000 mg by mouth every 6 (six) hours as needed for fever or headache (pain).    [provider]  ?albuterol (PROVENTIL) (2.5 MG/3ML) 0.083% nebulizer solution Take 2.5 mg by nebulization every 6 (six) hours as needed for wheezing or shortness of breath.    [provider]  ?albuterol (VENTOLIN HFA) 108 (90 Base) MCG/ACT inhaler Inhale 2 puffs into the lungs every 6 (six) hours as needed for wheezing or shortness of breath. 06/30/20   Thurnell Lose, MD  ?alendronate (FOSAMAX) 70 MG tablet Take 70 mg by mouth every Tuesday.  Take with a full glass of water on an empty stomach.    [provider]  ?atorvastatin (LIPITOR) 80 MG tablet Take 1 tablet (80 mg total) by mouth every evening. 02/22/21   Shawna Clamp, MD  ?calciu

## 2021-05-08 NOTE — Assessment & Plan Note (Addendum)
Suspect due to demand ischemia from acute respiratory failure and decompensated CHF. ?Continue carvedilol and Plavix. ?As needed sublingual NTG. ?2D echo: LVEF 55-60%, no regional wall motion abnormalities, grade 2 diastolic dysfunction severe pulmonary hypertension, small pericardial effusion ?

## 2021-05-08 NOTE — Assessment & Plan Note (Addendum)
Continue Opsumit and sildenafil ?Follows with Dr. Silas Flood, pulmonology. ?

## 2021-05-09 DIAGNOSIS — J9622 Acute and chronic respiratory failure with hypercapnia: Secondary | ICD-10-CM | POA: Diagnosis not present

## 2021-05-09 DIAGNOSIS — I5033 Acute on chronic diastolic (congestive) heart failure: Secondary | ICD-10-CM | POA: Diagnosis not present

## 2021-05-09 DIAGNOSIS — J9621 Acute and chronic respiratory failure with hypoxia: Secondary | ICD-10-CM | POA: Diagnosis not present

## 2021-05-09 LAB — BASIC METABOLIC PANEL
Anion gap: 7 (ref 5–15)
BUN: 21 mg/dL (ref 8–23)
CO2: 41 mmol/L — ABNORMAL HIGH (ref 22–32)
Calcium: 8.4 mg/dL — ABNORMAL LOW (ref 8.9–10.3)
Chloride: 89 mmol/L — ABNORMAL LOW (ref 98–111)
Creatinine, Ser: 1.53 mg/dL — ABNORMAL HIGH (ref 0.44–1.00)
GFR, Estimated: 34 mL/min — ABNORMAL LOW (ref >60)
Glucose, Bld: 106 mg/dL — ABNORMAL HIGH (ref 70–99)
Potassium: 3.6 mmol/L (ref 3.5–5.1)
Sodium: 137 mmol/L (ref 135–145)

## 2021-05-09 LAB — BRAIN NATRIURETIC PEPTIDE: B Natriuretic Peptide: 131.4 pg/mL — ABNORMAL HIGH (ref 0.0–100.0)

## 2021-05-09 MED ORDER — ENOXAPARIN SODIUM 30 MG/0.3ML IJ SOSY
30.0000 mg | PREFILLED_SYRINGE | INTRAMUSCULAR | Status: DC
  Administered 2021-05-09 – 2021-05-11 (×3): 30 mg via SUBCUTANEOUS
  Filled 2021-05-09 (×3): qty 0.3

## 2021-05-09 NOTE — Progress Notes (Signed)
?PROGRESS NOTE ?  ?Mandy [name]  POE:[id number]    DOB: 26-Mar-1940    DOA: 05/07/2021 ? ?PCP: [name], MD  ? ?I have briefly reviewed patients previous medical records in [facility]. ? ?Chief Complaint  ?Patient presents with  ? Shortness of Breath  ? ? ?Hospital Course:  ?81 year old female, speaks [place] Creole and very little English, medical history significant for severe pulmonary hypertension, chronic respiratory failure with hypoxia and hypercapnia on home oxygen 4 L/min, chronic diastolic CHF, HTN, HLD, type II DM, CKD stage IIIa, GERD, mild CAD, CVA, hyperthyroidism on methimazole, seen at at her pulmonologist office on day of admission for progressive worsening dyspnea on exertion for past several days along with dry cough and some chest pain.  Symptoms persisted despite doubling home dose of torsemide.  Directed to ED by pulmonology.  In ED, tachycardic, tachypneic and required 6 L/min [place] oxygen.  CTA chest negative for PE but showed small to moderate size right-sided pleural effusion, loculated and multiple groundglass opacities seen throughout the mid and upper lung that may be infection versus pulmonary edema.  Admitted for acute on chronic respiratory failure with hypoxia and hypercapnia secondary to acute on chronic diastolic CHF, recurrent right pleural effusion complicating underlying severe pulmonary hypertension.  Treating with IV Lasix.  Pulmonology consulted for assistance. ? ?Assessment and Plan: ?* Acute on chronic respiratory failure with hypoxia and hypercapnia (HCC) ?Secondary to acute on chronic diastolic CHF and recurrent right pleural effusion complicating underlying severe pulmonary hypertension. ?At baseline on home oxygen 4 L/min.  Required 6 L/min on presentation. ?Treated briefly with IV Lasix 40 mg twice daily, discontinued due to rising creatinine. ?Although CT chest mentions possibility of pneumonia, no clinical pneumonia and moreover procalcitonin negative and  hence antibiotics not continued. ?Titrate oxygen to maintain saturations between 89-92%.  Remains on 6 L/min but suspect can be weaned down, communicated with RN. ? ?Acute on chronic diastolic CHF (congestive heart failure) ([place]) ?Echo 02/14/2021: LVEF 55-60%, moderate concentric LVH, circumferential pericardial effusion, moderate left pleural effusion, severe pulmonary artery hypertension. ?Since patient complained of some chest discomfort, had mildly elevated troponin albeit with a flat trend and low index of suspicion for ACS. ?Treated briefly with IV Lasix 40 mg every 12 hours, developed worsening AKI, Lasix discontinued. ?Echo with normal LVEF. ? ?Acute renal failure superimposed on stage 3a chronic kidney disease ([place]) ?Prior baseline creatinine 1.14.  Presented with creatinine of 1.3. ?Creatinine had gone down to 1 but back up to 1.53 post IV Lasix.  IV Lasix discontinued. ?Follow BMP in AM. ? ?Recurrent right pleural effusion ?Multiple loculated right effusions ?Last therapeutic thoracentesis done in 02/2021 with over 700cc fluid removal ?Although IR was initially consulted for ultrasound-guided thoracentesis, given multiloculated pleural effusion, not sure if all will be accessible.  Pulmonology consultation appreciated.  Suspect due to CHF and recommended diuresis.  Did not see role for thoracentesis, fluid likely to reaccumulate. ? ?Severe pulmonary hypertension ([place]) ?Continue Opsumit. ?Recently switched from sildenafil to [facility] outpatient but had not yet started, started Taladafil  ?Follows with Dr. Silas Flood, pulmonology. ? ?Essential hypertension ?Continue carvedilol 12.5 Mg twice daily and hydralazine 25 Mg 3 times daily. ?Uncontrolled.  Added as needed IV hydralazine. ? ?Dyslipidemia ?Continue atorvastatin 80 mg daily. ? ?Hyperthyroidism ?Continue Tapazole 2.5 Mg daily. ? ?History of stroke ?Continue Plavix and statin ? ?Elevated troponin ?Suspect due to demand ischemia from acute respiratory  failure and decompensated CHF. ?Continue carvedilol and Plavix. ?As needed sublingual NTG. ?  2D echo: LVEF 55-60%, no regional wall motion abnormalities, grade 2 diastolic dysfunction severe pulmonary hypertension, small pericardial effusion ? ? ?Body mass index is 30.95 kg/m?. ? ? ?DVT prophylaxis: enoxaparin (LOVENOX) injection 40 mg Start: 05/07/21 2245   ?  Code Status: Full Code:  ?Family Communication: None at bedside. ?Disposition:  ?Status is: Inpatient ? ?  ? ?Consultants:   ?Pulmonology ? ?Procedures:   ?None ? ?Antimicrobials:   ?Not continued after admission. ? ?Subjective:  ?Sleeping when seen this morning.  Expressed frustration that she was woken up at 6 AM for meds and my visit.  Did not want to eat breakfast and wanted to be left alone to sleep.  Denied dyspnea or chest pain. ? ?Objective:  ? ?Vitals:  ? 05/09/21 0510 05/09/21 0515 05/09/21 1026 05/09/21 1324  ?BP:  (!) 166/72 (!) 154/79 (!) 160/68  ?Pulse:  93 92 89  ?Resp:  (!) 23 (!) 22 (!) 21  ?Temp: 97.7 ?F (36.5 ?C) 97.7 ?F (36.5 ?C) 98.8 ?F (37.1 ?C)   ?TempSrc: Oral Oral Oral   ?SpO2:  94% 95% 96%  ?Weight:  69.5 kg    ?Height:      ? ? ?General exam: Elderly female, moderately built and obese lying comfortably almost supine in bed without distress. ?Respiratory system: Clear to auscultation anteriorly.  Reduced breath sounds posteriorly, right >>left.  Occasional left basal crackles.  No increased work of breathing.  Stable. ?Cardiovascular system: S1 & S2 heard, RRR. No JVD, murmurs, rubs, gallops or clicks.  No leg edema.  Telemetry personally reviewed: Sinus rhythm. ?Gastrointestinal system: Abdomen is nondistended, soft and nontender. No organomegaly or masses felt. Normal bowel sounds heard. ?Central nervous system: Sleeping but arousable and seems oriented. No focal neurological deficits. ?Extremities: Symmetric 5 x 5 power. ?Skin: No rashes, lesions or ulcers ?Psychiatry: Hard to assess judgment and insight due to significant language  barrier. Mood & affect appropriate.  ? ?Data Reviewed:   ?I have personally reviewed following labs and imaging studies ? ?CBC: ?Recent Labs  ?Lab 05/07/21 ?1649  ?WBC 4.9  ?HGB 9.6*  ?HCT 31.8*  ?MCV 82.6  ?PLT 206  ? ?Basic Metabolic Panel: ?Recent Labs  ?Lab 05/07/21 ?1649 05/08/21 ?0119 05/09/21 ?0253  ?NA 135 137 137  ?K 4.2 3.5 3.6  ?CL 90* 91* 89*  ?CO2 33* 34* 41*  ?GLUCOSE 108* 133* 106*  ?BUN 30* 22 21  ?CREATININE 1.30* 1.00 1.53*  ?CALCIUM 9.0 8.7* 8.4*  ?MG 2.0  --   --   ? ?Liver Function Tests: ?Recent Labs  ?Lab 05/07/21 ?1649  ?AST 18  ?ALT 13  ?ALKPHOS 37*  ?BILITOT 0.5  ?PROT 6.9  ?ALBUMIN 3.6  ? ?CBG: ?No results for input(s): GLUCAP in the last 168 hours. ?Microbiology Studies:  ? ?Recent Results (from the past 240 hour(s))  ?MRSA Next Gen by PCR, Nasal     Status: None  ? Collection Time: 05/07/21  8:58 PM  ? Specimen: Nasal Mucosa; Nasal Swab  ?Result Value Ref Range Status  ? MRSA by PCR Next Gen NOT DETECTED NOT DETECTED Final  ?  Comment: (NOTE) ?The GeneXpert MRSA Assay (FDA approved for NASAL specimens only), ?is one component of a comprehensive MRSA colonization surveillance ?program. It is not intended to diagnose MRSA infection nor to guide ?or monitor treatment for MRSA infections. ?Test performance is not FDA approved in patients less than 2 years ?old. ?Performed at [facility], [place] [address], [place], [place] ?[address] ?  ?  Blood culture (routine x 2)     Status: None (Preliminary result)  ? Collection Time: 05/07/21  9:23 PM  ? Specimen: BLOOD RIGHT HAND  ?Result Value Ref Range Status  ? Specimen Description BLOOD RIGHT HAND  Final  ? Special Requests   Final  ?  BOTTLES DRAWN AEROBIC AND ANAEROBIC Blood Culture adequate volume  ? Culture   Final  ?  NO GROWTH 2 DAYS ?Performed at [facility], [place] [address] [place], [place] [address] ?  ? Report Status PENDING  Incomplete  ?Blood culture (routine x 2)     Status: None (Preliminary result)  ? Collection Time:  05/08/21  1:19 AM  ? Specimen: BLOOD  ?Result Value Ref Range Status  ? Specimen Description BLOOD BLOOD LEFT HAND  Final  ? Special Requests   Final  ?  BOTTLES DRAWN AEROBIC ONLY Blood Culture results may

## 2021-05-10 ENCOUNTER — Inpatient Hospital Stay (HOSPITAL_COMMUNITY): Payer: Medicare HMO

## 2021-05-10 DIAGNOSIS — J9621 Acute and chronic respiratory failure with hypoxia: Secondary | ICD-10-CM | POA: Diagnosis not present

## 2021-05-10 DIAGNOSIS — N1831 Chronic kidney disease, stage 3a: Secondary | ICD-10-CM

## 2021-05-10 DIAGNOSIS — J9622 Acute and chronic respiratory failure with hypercapnia: Secondary | ICD-10-CM | POA: Diagnosis not present

## 2021-05-10 DIAGNOSIS — I5033 Acute on chronic diastolic (congestive) heart failure: Secondary | ICD-10-CM | POA: Diagnosis not present

## 2021-05-10 DIAGNOSIS — N179 Acute kidney failure, unspecified: Secondary | ICD-10-CM | POA: Diagnosis not present

## 2021-05-10 LAB — BASIC METABOLIC PANEL
Anion gap: 5 (ref 5–15)
BUN: 26 mg/dL — ABNORMAL HIGH (ref 8–23)
CO2: 40 mmol/L — ABNORMAL HIGH (ref 22–32)
Calcium: 8.5 mg/dL — ABNORMAL LOW (ref 8.9–10.3)
Chloride: 90 mmol/L — ABNORMAL LOW (ref 98–111)
Creatinine, Ser: 1.43 mg/dL — ABNORMAL HIGH (ref 0.44–1.00)
GFR, Estimated: 37 mL/min — ABNORMAL LOW (ref >60)
Glucose, Bld: 110 mg/dL — ABNORMAL HIGH (ref 70–99)
Potassium: 3.5 mmol/L (ref 3.5–5.1)
Sodium: 135 mmol/L (ref 135–145)

## 2021-05-10 MED ORDER — POTASSIUM CHLORIDE CRYS ER 20 MEQ PO TBCR
20.0000 meq | EXTENDED_RELEASE_TABLET | Freq: Once | ORAL | Status: AC
  Administered 2021-05-10: 20 meq via ORAL
  Filled 2021-05-10: qty 1

## 2021-05-10 MED ORDER — FUROSEMIDE 10 MG/ML IJ SOLN
60.0000 mg | Freq: Once | INTRAMUSCULAR | Status: AC
  Administered 2021-05-10: 60 mg via INTRAVENOUS
  Filled 2021-05-10: qty 6

## 2021-05-10 NOTE — Progress Notes (Signed)
Set patient up on auto titrate BIPAP with 4 lpm bled in oxygen.  Patient keeps repeating she will only wear for 2 hours.  Tolerating well at this time. ?

## 2021-05-10 NOTE — Care Management Important Message (Signed)
Important Message ? ?Patient Details  ?Name: Mandy [name] ?MRN: [id number] ?Date of Birth: 01-22-1940 ? ? ?Medicare Important Message Given:  Yes ? ? ? ? ?Shelda Altes ?05/10/2021, 8:23 AM ?

## 2021-05-10 NOTE — Progress Notes (Signed)
?PROGRESS NOTE ?  ?Mandy [name]  YQI:[id number]    DOB: 1940/04/27    DOA: 05/07/2021 ? ?PCP: [name], MD  ? ?I have briefly reviewed patients previous medical records in [facility]. ? ?Chief Complaint  ?Patient presents with  ? Shortness of Breath  ? ? ?Hospital Course:  ?81 year old female, speaks [place] Creole and very little English, medical history significant for severe pulmonary hypertension, chronic respiratory failure with hypoxia and hypercapnia on home oxygen 4 L/min, chronic diastolic CHF, HTN, HLD, type II DM, CKD stage IIIa, GERD, mild CAD, CVA, hyperthyroidism on methimazole, seen at at her pulmonologist office on day of admission for progressive worsening dyspnea on exertion for past several days along with dry cough and some chest pain.  Symptoms persisted despite doubling home dose of torsemide.  Directed to ED by pulmonology.  In ED, tachycardic, tachypneic and required 6 L/min [place] oxygen.  CTA chest negative for PE but showed small to moderate size right-sided pleural effusion, loculated and multiple groundglass opacities seen throughout the mid and upper lung that may be infection versus pulmonary edema.  Admitted for acute on chronic respiratory failure with hypoxia and hypercapnia secondary to acute on chronic diastolic CHF, recurrent right pleural effusion complicating underlying severe pulmonary hypertension.  Pulmonology consulted.  Treated briefly with IV Lasix, held due to AKI.  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 ? ?Assessment and Plan: ?* Acute on chronic respiratory failure with hypoxia and hypercapnia (HCC) ?Secondary to acute on chronic diastolic CHF/cor pulmonale and recurrent right pleural effusion. ?At baseline on home oxygen 4 L/min.  Requiring 6 L/min here. ?Treated briefly with IV Lasix 40 mg twice daily, discontinued due to rising creatinine. ?No clinical pneumonia, antibiotics not continued. ?Titrate oxygen to maintain saturations  between 89-92%.  Remains on 6 L/min.   ?Pulmonology follow-up appreciated, starting CPAP overnight. ? ?Acute renal failure superimposed on stage 3a chronic kidney disease ([place]) ?Prior baseline creatinine 1.14.  Presented with creatinine of 1.3. ?Creatinine had gone down to 1 but back up to 1.53 post IV Lasix.  IV Lasix discontinued.  Creatinine down to 1.4. ?Follow BMP in AM. ? ?Acute on chronic diastolic CHF (congestive heart failure) ([place]) ?Cor pulmonale ?Echo 02/14/2021: LVEF 55-60%, moderate concentric LVH, circumferential pericardial effusion, moderate left pleural effusion, severe pulmonary artery hypertension. ?Since patient complained of some chest discomfort, had mildly elevated troponin albeit with a flat trend and low index of suspicion for ACS. ?Treated briefly with IV Lasix 40 mg every 12 hours, developed worsening AKI, Lasix discontinued. ?Echo 05/08/2021: LVEF 55-60%, severe LVH, grade 2 diastolic dysfunction, severe pulmonary hypertension and small pericardial effusion. ?Pulmonology recommending continued diuresis ? ?Recurrent right pleural effusion ?Multiple loculated right effusions ?Last therapeutic thoracentesis done in 02/2021 with over 700cc fluid removal ?Although IR was initially consulted for ultrasound-guided thoracentesis, given multiloculated pleural effusion, not sure if all will be accessible.  Pulmonology consultation appreciated.  Suspect due to CHF and recommended diuresis.  Did not see role for thoracentesis, fluid likely to reaccumulate. ? ?Severe pulmonary hypertension ([place]) ?Continue Opsumit. ?Recently switched from sildenafil to [facility] outpatient but had not yet started, started Taladafil  ?Follows with Dr. Silas Flood, pulmonology. ? ?Essential hypertension ?Continue carvedilol 12.5 Mg twice daily and hydralazine 25 Mg 3 times daily. ?Uncontrolled.  Added as needed IV hydralazine. ? ?Dyslipidemia ?Continue atorvastatin 80 mg daily. ? ?Hyperthyroidism ?Continue Tapazole 2.5 Mg  daily. ? ?History of stroke ?Continue Plavix and statin ? ?Elevated troponin ?Suspect due to demand  ischemia from acute respiratory failure and decompensated CHF. ?Continue carvedilol and Plavix. ?As needed sublingual NTG. ?2D echo: LVEF 55-60%, no regional wall motion abnormalities, grade 2 diastolic dysfunction severe pulmonary hypertension, small pericardial effusion ? ? ?Body mass index is 31.79 kg/m?. ? ? ?DVT prophylaxis: enoxaparin (LOVENOX) injection 30 mg Start: 05/09/21 2245   ?  Code Status: Full Code:  ?Family Communication: None at bedside. ?Disposition:  ?Status is: Inpatient ? ?  ? ?Consultants:   ?Pulmonology ? ?Procedures:   ?CPAP with autotitration from 5/8 night. ? ?Antimicrobials:   ?Not continued after admission. ? ?Subjective:  ?States that she is slept well last night.  Although indicates that her breathing is good, noted to be clearly dyspneic while speaking and tachypneic. ? ?Objective:  ? ?Vitals:  ? 05/10/21 0305 05/10/21 0410 05/10/21 0927 05/10/21 1000  ?BP:  (!) 168/76 117/60   ?Pulse:  88 89   ?Resp:  19 18   ?Temp:  98.9 ?F (37.2 ?C) 98.8 ?F (37.1 ?C)   ?TempSrc:  Oral Oral   ?SpO2:  99% 95% 93%  ?Weight: 71.4 kg     ?Height:      ? ? ?General exam: Elderly female, moderately built and obese, propped up in bed with her breakfast tray in front of her, dyspneic while speaking and clearly tachypneic. ?Respiratory system: Clear to auscultation anteriorly.  Harsh and reduced breath sounds bilaterally two thirds way up with occasional crackles but no wheezing or rhonchi.   ?Cardiovascular system: S1 & S2 heard, RRR. No JVD, murmurs, rubs, gallops or clicks.  No leg edema.  Telemetry personally reviewed: Sinus rhythm. ?Gastrointestinal system: Abdomen is nondistended, soft and nontender. No organomegaly or masses felt. Normal bowel sounds heard. ?Central nervous system: Sleeping but arousable and seems oriented. No focal neurological deficits. ?Extremities: Symmetric 5 x 5 power. ?Skin: No  rashes, lesions or ulcers ?Psychiatry: Hard to assess judgment and insight due to significant language barrier. Mood & affect appropriate.  ? ?Data Reviewed:   ?I have personally reviewed following labs and imaging studies ? ?CBC: ?Recent Labs  ?Lab 05/07/21 ?1649  ?WBC 4.9  ?HGB 9.6*  ?HCT 31.8*  ?MCV 82.6  ?PLT 206  ? ?Basic Metabolic Panel: ?Recent Labs  ?Lab 05/07/21 ?1649 05/08/21 ?0119 05/09/21 ?0253 05/10/21 ?0422  ?NA 135 137 137 135  ?K 4.2 3.5 3.6 3.5  ?CL 90* 91* 89* 90*  ?CO2 33* 34* 41* 40*  ?GLUCOSE 108* 133* 106* 110*  ?BUN 30* 22 21 26*  ?CREATININE 1.30* 1.00 1.53* 1.43*  ?CALCIUM 9.0 8.7* 8.4* 8.5*  ?MG 2.0  --   --   --   ? ?Liver Function Tests: ?Recent Labs  ?Lab 05/07/21 ?1649  ?AST 18  ?ALT 13  ?ALKPHOS 37*  ?BILITOT 0.5  ?PROT 6.9  ?ALBUMIN 3.6  ? ?CBG: ?No results for input(s): GLUCAP in the last 168 hours. ?Microbiology Studies:  ? ?Recent Results (from the past 240 hour(s))  ?MRSA Next Gen by PCR, Nasal     Status: None  ? Collection Time: 05/07/21  8:58 PM  ? Specimen: Nasal Mucosa; Nasal Swab  ?Result Value Ref Range Status  ? MRSA by PCR Next Gen NOT DETECTED NOT DETECTED Final  ?  Comment: (NOTE) ?The GeneXpert MRSA Assay (FDA approved for NASAL specimens only), ?is one component of a comprehensive MRSA colonization surveillance ?program. It is not intended to diagnose MRSA infection nor to guide ?or monitor treatment for MRSA infections. ?Test performance is not FDA approved  in patients less than 2 years ?old. ?Performed at [facility], [place] [address] [place], [place] ?[address] ?  ?Blood culture (routine x 2)     Status: None (Preliminary result)  ? Collection Time: 05/07/21  9:23 PM  ? Specimen: BLOOD RIGHT HAND  ?Result Value Ref Range Status  ? Specimen Description BLOOD RIGHT HAND  Final  ? Special Requests   Final  ?  BOTTLES DRAWN AEROBIC AND ANAEROBIC Blood Culture adequate volume  ? Culture   Final  ?  NO GROWTH 3 DAYS ?Performed at [facility], [place]  [address], [place], [place] [address] ?  ? Report Status PENDING  Incomplete  ?Blood culture (routine x 2)     Status: None (Preliminary result)  ? Collection Time: 05/08/21  1:19 AM  ? Specimen: BLOOD  ?Result Value

## 2021-05-10 NOTE — Progress Notes (Addendum)
? ?NAME:  Mandy [name], MRN:  [id number], DOB:  09-03-1940, LOS: 3 ?ADMISSION DATE:  05/07/2021, CONSULTATION DATE:  05/09/21 ?REFERRING MD:  Dr Algis Liming, CHIEF COMPLAINT:  shortness of breath, hypoxemia  ? ?History of Present Illness:  ?81 year old woman who has been followed in our office for severe pulmonary hypertension.  She is a never smoker, has history of systemic hypertension, CHF, CVA, diabetes, hypothyroidism, CKD stage III, obesity.  Pulmonary hypertension confirmed on right heart catheterization with a mean PASP 39 mmHg and PAOP 18, preserved cardiac output 6.1.  Course complicated by pleural effusions, right hydropneumothorax, thoracentesis while hospitalized.  At that time also started on sildenafil in addition to Opsumit. Has had nausea with initiation of the Opsumit.  Plans to change to tadalafil when she saw Dr. Silas Flood in follow-up 04/28/2021 she was on 4 L/min oxygen.  Diuretic regimen furosemide 40 mg twice daily, increased due to progressive shortness of breath on 05/08/2018 3 to 80 mg twice daily.  Chest x-ray suggestive of progressive pulmonary edema.  She was directed to the ED and admitted to the hospital on 5/5. ? ?CT-PA 5/5 reviewed, showed no evidence of PE, smaller partially loculated moderate right pleural effusion, stable small left pleural effusion, bilateral groundglass and septal thickening most consistent with pulmonary edema.  Stable cardiomegaly. ? ?SPO2 has improved, now 100% on 6 L/min nasal cannula ? ? ?Pertinent  Medical History  ? ?Past Medical History:  ?Diagnosis Date  ? Aortic atherosclerosis ([place]) 10/08/2018  ? Arthritis   ? Chronic kidney disease, stage 3a ([place])   ? Chronic respiratory failure ([place])   ? Diabetes mellitus without complication ([place])   ? GERD (gastroesophageal reflux disease)   ? Hepatic steatosis 10/08/2018  ? Hyperlipidemia   ? Hypertension   ? Hyperthyroidism   ? Mass of right ovary 10/08/2018  ? 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 Mild CAD   ? Osteoporosis   ? Pneumonia   ? Renal infarct [facility])   ? SBO (small bowel obstruction) ([place]) 08/09/2018  ? Severe pulmonary hypertension ([place])   ? Stroke (cerebrum) ([place])   ? ? ?Significant Hospital Events: ?Including procedures, antibiotic start and stop dates in addition to other pertinent events   ?CT-PA 5/5 ? ?Interim History / Subjective:  ?States breathing is better ?Respirations are rapid and shallow, sats are 99% on 6 L [place]. ?CTA indicative of tracheobronchomalacia which can be contributing to her breathlessness   ?Diuresis is limited by renal function , 415 cc to date  ? ?Objective   ?Blood pressure 117/60, pulse 89, temperature 98.8 ?F (37.1 ?C), temperature source Oral, resp. rate 18, height '4\' 11"'$  (1.499 m), weight 71.4 kg, SpO2 93 %. ?   ?   ? ?Intake/Output Summary (Last 24 hours) at 05/10/2021 1049 ?Last data filed at 05/10/2021 [phone] ?Gross per 24 hour  ?Intake 420 ml  ?Output 700 ml  ?Net -280 ml  ? ?Filed Weights  ? 05/08/21 0031 05/09/21 0515 05/10/21 0305  ?Weight: 71.1 kg 69.5 kg 71.4 kg  ? ? ?Examination: ?General: Elderly woman getting OOB to chair , rapid shallow respirations with adequate oxygen saturation  ?HENT: Normocephalic, Trace JVD, No LAD, PERRLA ?Lungs: Bilateral chest excursion , Decreased to bilaterally per bases , few crackles  ?Cardiovascular: S1, S2, S4, Regular, distant, no murmur.   ?Abdomen: Obese, nondistended , NT, positive bowel sounds, Body mass index is 31.79 kg/m?.  ?Extremities: Trace lower extremity edema bilaterally ?Neuro: Awake, alert,  Does not speak English, but states breathing better. ?GU: Deferred ? ?Resolved Hospital Problem list   ? ? ?Assessment & Plan:  ?Acute on chronic hypoxemic respiratory failure.  Appears to be due to her PAH, pulmonary edema. 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  No antibiotics at present ?Plan  ?HRCT to better evaluate  Tracheobronchomalacia ?Will start QHS CPAP Auto Set 5-15 cm H2O pressure with continuous oxygen bled into system at her current oxygen need/ rate ? ?Pulmonary Hypertension, multifactorial with R and L heart dysfxn, acute cardiogenic pulmonary edema ?Continue Opsumit, restart sildenafil ?Agree with aggressive diuresis as blood pressure and renal function will tolerate>> limited by renal function ? ?Bilateral R>L pleural effusions.  ?No clear benefit at this point to tapping her pleural effusion.  It would likely quickly reaccumulate.  Prefer aggressive diuresis ? ?APP Time 35 minutes ? ?Magdalen Spatz, MSN, AGACNP-BC ?[facility] ?See Amion for personal pager ?PCCM on call pager [phone]  ?05/10/2021 ?11:09 AM ? ? ? ?Labs   ?CBC: ?Recent Labs  ?Lab 05/07/21 ?1649  ?WBC 4.9  ?HGB 9.6*  ?HCT 31.8*  ?MCV 82.6  ?PLT 206  ? ? ?Basic Metabolic Panel: ?Recent Labs  ?Lab 05/07/21 ?1649 05/08/21 ?0119 05/09/21 ?0253 05/10/21 ?0422  ?NA 135 137 137 135  ?K 4.2 3.5 3.6 3.5  ?CL 90* 91* 89* 90*  ?CO2 33* 34* 41* 40*  ?GLUCOSE 108* 133* 106* 110*  ?BUN 30* 22 21 26*  ?CREATININE 1.30* 1.00 1.53* 1.43*  ?CALCIUM 9.0 8.7* 8.4* 8.5*  ?MG 2.0  --   --   --   ? ?GFR: ?Estimated Creatinine Clearance: 27 mL/min (A) (by C-G formula based on SCr of 1.43 mg/dL (H)). ?Recent Labs  ?Lab 05/07/21 ?1649 05/08/21 ?0119  ?PROCALCITON  --  <0.10  ?WBC 4.9  --   ? ? ?Liver Function Tests: ?Recent Labs  ?Lab 05/07/21 ?1649  ?AST 18  ?ALT 13  ?ALKPHOS 37*  ?BILITOT 0.5  ?PROT 6.9  ?ALBUMIN 3.6  ? ?No results for input(s): LIPASE, AMYLASE in the last 168 hours. ?No results for input(s): AMMONIA in the last 168 hours. ? ?ABG ?   ?Component Value Date/Time  ? PHART 7.381 02/10/2021 0855  ? PCO2ART 76.0 ([place]) 02/10/2021 0855  ? PO2ART 91.2 02/10/2021 0855  ? HCO3 44.0 (H) 02/10/2021 0855  ? TCO2 47 (H) 11/30/2020 1632  ? O2SAT 96.5 02/22/2021 0621  ?  ? ?Coagulation Profile: ?No results for input(s): INR, PROTIME in the  last 168 hours. ? ?Cardiac Enzymes: ?No results for input(s): CKTOTAL, CKMB, CKMBINDEX, TROPONINI in the last 168 hours. ? ?HbA1C: ?Hgb A1c MFr Bld  ?Date/Time Value Ref Range Status  ?12/07/2020 01:44 AM 5.7 (H) 4.8 - 5.6 % Final  ?  Comment:  ?  (NOTE) ?Pre diabetes:          5.7%-6.4% ? ?Diabetes:              >6.4% ? ?Glycemic control for   <7.0% ?adults with diabetes ?  ?11/03/2020 06:43 PM 5.9 (H) 4.8 - 5.6 % Final  ?  Comment:  ?  (NOTE) ?Pre diabetes:          5.7%-6.4% ? ?Diabetes:              >6.4% ? ?Glycemic control for   <7.0% ?adults with diabetes ?  ? ? ?CBG: ?No results for input(s): GLUCAP in the  last 168 hours. ? ?Review of Systems:   ?As per HPI ? ?Past Medical History:  ?She,  has a past medical history of Aortic atherosclerosis ([place]) (10/08/2018), Arthritis, Chronic kidney disease, stage 3a ([place]), Chronic respiratory failure ([place]), Diabetes mellitus without complication ([place]), GERD (gastroesophageal reflux disease), Hepatic steatosis (10/08/2018), Hyperlipidemia, Hypertension, Hyperthyroidism, Mass of right ovary (10/08/2018), Mediastinal mass, Mild CAD, Osteoporosis, Pneumonia, Renal infarct ([place]), SBO (small bowel obstruction) ([place]) (08/09/2018), Severe pulmonary hypertension ([place]), and Stroke (cerebrum) ([place]).  ? ?Surgical History:  ? ?Past Surgical History:  ?Procedure Laterality Date  ? CATARACT EXTRACTION W/ INTRAOCULAR LENS  IMPLANT, BILATERAL    ? COLONOSCOPY    ? LEFT HEART CATH AND CORONARY ANGIOGRAPHY N/A 11/30/2020  ? Procedure: LEFT HEART CATH AND CORONARY ANGIOGRAPHY;  Surgeon: Early Osmond, MD;  Location: [facility];  Service: Cardiovascular;  Laterality: N/A;  ? RIGHT/LEFT HEART CATH AND CORONARY ANGIOGRAPHY N/A 11/30/2020  ? Procedure: RIGHT/LEFT HEART CATH AND CORONARY ANGIOGRAPHY;  Surgeon: Early Osmond, MD;  Location: [facility];  Service: Cardiovascular;  Laterality: N/A;  ?  ? ?Social History:  ? reports that she has never smoked. She has never used  smokeless tobacco. She reports that she does not drink alcohol and does not use drugs.  ? ?Family History:  ?Her family history includes Kidney disease in her son. There is no history of Colon cancer or Breast cancer.  ? ?[place]

## 2021-05-10 NOTE — Progress Notes (Signed)
Physical Therapy Treatment ?Patient Details ?Name: Mandy [name] ?MRN: [id number] ?DOB: 11-Feb-1940 ?Today's Date: 05/10/2021 ? ? ?History of Present Illness 81 yo female admitted 5/5 with acute on chronic resp failure, acute on chronic HF, bil pleural effusion, pulm HTN, elevation of troponin.  Pt is on 6L O2, baseline use is 4L O2.  PMHx of anemia, severe pulm HTN, DM, HF, CVA, CAD, CKD. ? ?  ?PT Comments  ? ? Patient progressing slowly towards PT goals. Reports not feeling good when walking. Noted to have right knee instability and 2/4 DOE with walking today. Sp02 dropped to 81% on 4L/min 02 [place], with activity, increased to 6L post walk and took 02 sats a few mins to recover to >90% with cues for pursed lip breathing. Titrated back down to 4L post session. Will require hands on assist for ambulation at home for safety as pt is a fall risk. Will follow. ?  ?Recommendations for follow up therapy are one component of a multi-disciplinary discharge planning process, led by the attending physician.  Recommendations may be updated based on patient status, additional functional criteria and insurance authorization. ? ?Follow Up Recommendations ? Home health PT ?  ?  ?Assistance Recommended at Discharge Frequent or constant Supervision/Assistance  ?Patient can return home with the following A little help with walking and/or transfers;A little help with bathing/dressing/bathroom;Assistance with cooking/housework;Direct supervision/assist for medications management;Direct supervision/assist for financial management;Assist for transportation;Help with stairs or ramp for entrance ?  ?Equipment Recommendations ? Rolling walker (2 wheels)  ?  ?Recommendations for Other Services   ? ? ?  ?Precautions / Restrictions Precautions ?Precautions: Fall;Other (comment) ?Precaution Comments: watch 02 ?Restrictions ?Weight Bearing Restrictions: No  ?  ? ?Mobility ? Bed Mobility ?  ?  ?  ?  ?  ?  ?  ?General bed mobility comments: Up in chair  upon PT arrival. ?  ? ?Transfers ?Overall transfer level: Needs assistance ?Equipment used: Rolling walker (2 wheels) ?Transfers: Sit to/from Stand ?Sit to Stand: Supervision ?  ?  ?  ?  ?  ?General transfer comment: Supervision for safety. Stood from Youth worker. ?  ? ?Ambulation/Gait ?Ambulation/Gait assistance: Min guard ?Gait Distance (Feet): 35 Feet ?Assistive device: Rolling walker (2 wheels) ?Gait Pattern/deviations: Step-through pattern, Decreased stride length, Wide base of support ?Gait velocity: reduced ?  ?  ?General Gait Details: Slow, mildly unsteady gait with right knee instability but no buckling. Sp02 dropped to 81% on 4L/min 02 [place], increased to 6L and took a few mins to recover to >90% with cues for pursed lip breathing. ? ? ?Stairs ?  ?  ?  ?  ?  ? ? ?Wheelchair Mobility ?  ? ?Modified Rankin (Stroke Patients Only) ?  ? ? ?  ?Balance Overall balance assessment: Needs assistance ?Sitting-balance support: Feet supported, No upper extremity supported ?Sitting balance-Leahy Scale: Good ?  ?  ?Standing balance support: During functional activity, Reliant on assistive device for balance ?Standing balance-Leahy Scale: Poor ?Standing balance comment: Reliant on Rw ?  ?  ?  ?  ?  ?  ?  ?  ?  ?  ?  ?  ? ?  ?Cognition Arousal/Alertness: Awake/alert ?Behavior During Therapy: [facility] for tasks assessed/performed ?Overall Cognitive Status: Difficult to assess ?  ?  ?  ?  ?  ?  ?  ?  ?  ?  ?  ?  ?  ?  ?  ?  ?General Comments: Follows commands well with some repetition ?  ?  ? ?  ?  Exercises   ? ?  ?General Comments General comments (skin integrity, edema, etc.): Sp02 dropped to 81% on 4L/min 02 [place], increased to 6L and took 02 a few mins to recover to >90%. Titrated back down to 4L post session. ?  ?  ? ?Pertinent Vitals/Pain Pain Assessment ?Pain Assessment: No/denies pain  ? ? ?Home Living   ?  ?  ?  ?  ?  ?  ?  ?  ?  ?   ?  ?Prior Function    ?  ?  ?   ? ?PT Goals (current goals can now be found in the care plan  section) Progress towards PT goals: Progressing toward goals (slowly) ? ?  ?Frequency ? ? ? Min 3X/week ? ? ? ?  ?PT Plan Current plan remains appropriate  ? ? ?Co-evaluation   ?  ?  ?  ?  ? ?  ?AM-PAC PT "6 Clicks" Mobility   ?Outcome Measure ? Help needed turning from your back to your side while in a flat bed without using bedrails?: None ?Help needed moving from lying on your back to sitting on the side of a flat bed without using bedrails?: A Little ?Help needed moving to and from a bed to a chair (including a wheelchair)?: A Little ?Help needed standing up from a chair using your arms (e.g., wheelchair or bedside chair)?: A Little ?Help needed to walk in hospital room?: A Little ?Help needed climbing 3-5 steps with a railing? : A Lot ?6 Click Score: 18 ? ?  ?End of Session Equipment Utilized During Treatment: Gait belt;Oxygen ?Activity Tolerance: Treatment limited secondary to medical complications (Comment) (02) ?Patient left: in chair;with call bell/phone within reach;with chair alarm set ?Nurse Communication: Mobility status ?PT Visit Diagnosis: Unsteadiness on feet (R26.81);Muscle weakness (generalized) (M62.81);Difficulty in walking, not elsewhere classified (R26.2) ?  ? ? ?Time: 9381-8299 ?PT Time Calculation (min) (ACUTE ONLY): 19 min ? ?Charges:  $Gait Training: 8-22 mins          ?          ? ?Marisa Severin, PT, DPT ?Acute Rehabilitation Services ?Secure chat preferred ?Office [phone] ? ? ? ? ? ?[place] ?05/10/2021, 1:08 PM ? ?

## 2021-05-10 NOTE — TOC Progression Note (Signed)
Transition of Care (TOC) - Progression Note  ? ? ?Patient Details  ?Name: Genavieve Mangiapane ?MRN: [id number] ?Date of Birth: [birth year] ? ?Transition of Care (TOC) CM/SW Contact  ?Zenon Mayo, RN ?Phone Number: ?05/10/2021, 5:43 PM ? ?Clinical Narrative:    ? ? ?Transition of Care (TOC) Screening Note ? ? ?Patient Details  ?Name: Macklyn Glandon ?Date of Birth: May 03, 1940 ? ? ?Transition of Care (TOC) CM/SW Contact:    ?Zenon Mayo, RN ?Phone Number: ?05/10/2021, 5:43 PM ? ? ? ?Transition of Care Department [facility]) has reviewed patient and no TOC needs have been identified at this time. We will continue to monitor patient advancement through interdisciplinary progression rounds. If new patient transition needs arise, please place a TOC consult. ?  ? ?  ?  ? ?Expected Discharge Plan and Services ?  ?  ?  ?  ?  ?                ?  ?  ?  ?  ?  ?  ?  ?  ?  ?  ? ? ?Social Determinants of Health (SDOH) Interventions ?  ? ?Readmission Risk Interventions ? ?  02/05/2021  ?  9:57 AM 11/06/2020  ?  9:50 AM  ?Readmission Risk Prevention Plan  ?Transportation Screening Complete Complete  ?PCP or Specialist Appt within 3-5 Days  Complete  ?[place] or Home Care Consult  Complete  ?Social Work Consult for [place] Planning/Counseling  Complete  ?Palliative Care Screening  Complete  ?Medication Review Press photographer) Complete Complete  ?PCP or Specialist appointment within 3-5 days of discharge Complete   ?[place] or Home Care Consult Complete   ?SW Recovery Care/Counseling Consult Complete   ?Palliative Care Screening Complete   ?[place] Not Applicable   ? ? ?

## 2021-05-11 DIAGNOSIS — I2781 Cor pulmonale (chronic): Secondary | ICD-10-CM

## 2021-05-11 DIAGNOSIS — J9622 Acute and chronic respiratory failure with hypercapnia: Secondary | ICD-10-CM | POA: Diagnosis not present

## 2021-05-11 DIAGNOSIS — J9621 Acute and chronic respiratory failure with hypoxia: Secondary | ICD-10-CM | POA: Diagnosis not present

## 2021-05-11 DIAGNOSIS — I272 Pulmonary hypertension, unspecified: Secondary | ICD-10-CM | POA: Diagnosis not present

## 2021-05-11 LAB — BASIC METABOLIC PANEL
Anion gap: 8 (ref 5–15)
BUN: 29 mg/dL — ABNORMAL HIGH (ref 8–23)
CO2: 39 mmol/L — ABNORMAL HIGH (ref 22–32)
Calcium: 8.5 mg/dL — ABNORMAL LOW (ref 8.9–10.3)
Chloride: 88 mmol/L — ABNORMAL LOW (ref 98–111)
Creatinine, Ser: 1.56 mg/dL — ABNORMAL HIGH (ref 0.44–1.00)
GFR, Estimated: 33 mL/min — ABNORMAL LOW (ref >60)
Glucose, Bld: 100 mg/dL — ABNORMAL HIGH (ref 70–99)
Potassium: 3.6 mmol/L (ref 3.5–5.1)
Sodium: 135 mmol/L (ref 135–145)

## 2021-05-11 MED ORDER — POTASSIUM CHLORIDE CRYS ER 20 MEQ PO TBCR
40.0000 meq | EXTENDED_RELEASE_TABLET | Freq: Once | ORAL | Status: AC
Start: 2021-05-11 — End: 2021-05-11
  Administered 2021-05-11: 40 meq via ORAL
  Filled 2021-05-11: qty 2

## 2021-05-11 MED ORDER — FUROSEMIDE 10 MG/ML IJ SOLN
40.0000 mg | Freq: Four times a day (QID) | INTRAMUSCULAR | Status: AC
  Administered 2021-05-11 (×2): 40 mg via INTRAVENOUS
  Filled 2021-05-11 (×2): qty 4

## 2021-05-11 NOTE — Progress Notes (Signed)
? ?NAME:  Mandy [name], MRN:  [id number], DOB:  01-May-1940, LOS: 4 ?ADMISSION DATE:  05/07/2021, CONSULTATION DATE:  05/09/21 ?REFERRING MD:  Dr Algis Liming, CHIEF COMPLAINT:  shortness of breath, hypoxemia  ? ?History of Present Illness:  ?81 year old woman who has been followed in our office for severe pulmonary hypertension.  She is a never smoker, has history of systemic hypertension, CHF, CVA, diabetes, hypothyroidism, CKD stage III, obesity.  Pulmonary hypertension confirmed on right heart catheterization with a mean PASP 39 mmHg and PAOP 18, preserved cardiac output 6.1.  Course complicated by pleural effusions, right hydropneumothorax, thoracentesis while hospitalized.  At that time also started on sildenafil in addition to Opsumit. Has had nausea with initiation of the Opsumit.  Plans to change to tadalafil when she saw Dr. Silas Flood in follow-up 04/28/2021 she was on 4 L/min oxygen.  Diuretic regimen furosemide 40 mg twice daily, increased due to progressive shortness of breath on 05/08/2018 3 to 80 mg twice daily.  Chest x-ray suggestive of progressive pulmonary edema.  She was directed to the ED and admitted to the hospital on 5/5. ? ?CT-PA 5/5 reviewed, showed no evidence of PE, smaller partially loculated moderate right pleural effusion, stable small left pleural effusion, bilateral groundglass and septal thickening most consistent with pulmonary edema.  Stable cardiomegaly. ? ?SPO2 has improved, now 100% on 6 L/min nasal cannula ? ? ?Pertinent  Medical History  ? ?Past Medical History:  ?Diagnosis Date  ? Aortic atherosclerosis ([place]) 10/08/2018  ? Arthritis   ? Chronic kidney disease, stage 3a ([place])   ? Chronic respiratory failure ([place])   ? Diabetes mellitus without complication ([place])   ? GERD (gastroesophageal reflux disease)   ? Hepatic steatosis 10/08/2018  ? Hyperlipidemia   ? Hypertension   ? Hyperthyroidism   ? Mass of right ovary 10/08/2018  ? 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 Mild CAD   ? Osteoporosis   ? Pneumonia   ? Renal infarct [facility])   ? SBO (small bowel obstruction) ([place]) 08/09/2018  ? Severe pulmonary hypertension ([place])   ? Stroke (cerebrum) ([place])   ? ? ?Significant Hospital Events: ?Including procedures, antibiotic start and stop dates in addition to other pertinent events   ?CT-PA 5/5 ?TTE 11/22: Severely elevated RVSP 83 , IVC dilation, LA and RA dilation, trivial PCE ?5/9 HRCT > consolidation RLL, loculated pleural effusion, tracheobronchomalacia ? ?Interim History / Subjective:  ? ?Says she feels OK ?On 4L [place] ?Slept with CPAP for about 3 hours per nursing  ? ?Objective   ?Blood pressure (!) 154/68, pulse 94, temperature 98.8 ?F (37.1 ?C), temperature source Oral, resp. rate (!) 21, height '4\' 11"'$  (1.499 m), weight 69.8 kg, SpO2 97 %. ?   ?   ? ?Intake/Output Summary (Last 24 hours) at 05/11/2021 1024 ?Last data filed at 05/11/2021 [phone] ?Gross per 24 hour  ?Intake 720 ml  ?Output 875 ml  ?Net -155 ml  ? ?Filed Weights  ? 05/09/21 0515 05/10/21 0305 05/11/21 0500  ?Weight: 69.5 kg 71.4 kg 69.8 kg  ? ? ?Examination: ? ?General:  Chronically ill appearing, resting comfortably in bed ?HENT: NCAT OP clear ?PULM: Diminished R lung B, normal effort ?CV: RRR, no mgr ?GI: BS+, soft, nontender ?MSK: normal bulk and tone ?Neuro: awake, alert, no distress, MAEW ? ? ?Resolved Hospital Problem list   ? ? ?Assessment & Plan:  ?Acute on chronic hypoxemic respiratory failure.   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 ?Discussion: ?Complex case.  Discussed with Dr. Silas Flood.  It looks like she is nearing her baseline.  The chronic right sided loculated effusions and compressive atelectasis vs RLL consolidation are most likely explained by a chronic pulmonary hypertension, though some degree of  scarring post surgery likely contribute.  Tracheobronchomalacia likely contribute to dyspnea, group 3 PH.   ? ?Plan: ?Diuresis with lasix x2 today (creatinine is near baseline)> I have ordered ?Repeat BMET in AM, monitor UOP ?Wean off O2 for O2 saturation > 88% ?CPAP qHS, will try to make arrangements for that as an outpatient ?If renal function stable in AM could likely D/C home tomorrow ?Continue opsumit and sildenafil ?Given oxygen need, advanced age, would hold off on biopsy/invasive work up of RLL consolidation.  Suspect it is inflammatory/post op changes ? ? ? ?Critical care time: N/A ?  ? ?Roselie Awkward, MD ?[facility] ?Pager: [phone] ?Cell: [phone] ?After 7:00 pm call Elink  [phone] ? ?

## 2021-05-11 NOTE — Progress Notes (Signed)
?PROGRESS NOTE ?  ?Mandy [name]  BTD:[id number]    DOB: Dec 18, 1940    DOA: 05/07/2021 ? ?PCP: [name], MD  ? ?I have briefly reviewed patients previous medical [facility] in [facility]. ? ?Chief Complaint  ?Patient presents with  ? Shortness of Breath  ? ? ?Hospital Course:  ?81 year old female, speaks [place] Creole and very little English, medical history significant for severe pulmonary hypertension, chronic respiratory failure with hypoxia and hypercapnia on home oxygen 4 L/min, chronic diastolic CHF, HTN, HLD, type II DM, CKD stage IIIa, GERD, mild CAD, CVA, hyperthyroidism on methimazole, seen at at her pulmonologist office on day of admission for progressive worsening dyspnea on exertion for past several days along with dry cough and some chest pain.  Symptoms persisted despite doubling home dose of torsemide.  Directed to ED by pulmonology.  In ED, tachycardic, tachypneic and required 6 L/min [place] oxygen.  CTA chest negative for PE but showed small to moderate size right-sided pleural effusion, loculated and multiple groundglass opacities seen throughout the mid and upper lung that may be infection versus pulmonary edema.  Admitted for acute on chronic respiratory failure with hypoxia and hypercapnia secondary to acute on chronic diastolic CHF, recurrent right pleural effusion complicating underlying severe pulmonary hypertension.  Pulmonology consulted.  Treating with IV Lasix for decompensated cor pulmonale.  Plan to DC home with CPAP at bedtime.  Possible DC home 5/10. ? ?Assessment and Plan: ?* Acute on chronic respiratory failure with hypoxia and hypercapnia (HCC) ?Due to decompensated cor pulmonale, pulmonary hypertension, tracheobronchomalacia, chronic right pleural effusions and chronic diastolic CHF. ?At baseline on home oxygen 4 L/min.  Requiring 6 L/min here. ?See management of underlying etiology as below. ?Titrate oxygen to maintain saturations between 89-92%.  Remains on 6 L/min.    ?CPAP at bedtime initiated last night, DC home on same and pulmonology will try and arrange at discharge. ? ?Acute renal failure superimposed on stage 3a chronic kidney disease ([place]) ?Prior baseline creatinine 1.14.   ?Creatinine mildly elevated and labile in the context of IV Lasix diuresis.  Fluctuating in the 1.4-1.5 range. ?Follow BMP in AM. ? ?Cor pulmonale ([place]) ?Decompensated cor pulmonale ?Echo 02/14/2021: LVEF 55-60%, moderate concentric LVH, circumferential pericardial effusion, moderate left pleural effusion, severe pulmonary artery hypertension. ?Treated briefly with IV Lasix, creatinine bumped up slightly but improved and IV Lasix was continued. ?Echo 05/08/2021: LVEF 55-60%, severe LVH, grade 2 diastolic dysfunction, severe pulmonary hypertension and small pericardial effusion. ? ?Recurrent right pleural effusion ?Multiple loculated right effusions ?Last therapeutic thoracentesis done in 02/2021 with over 700cc fluid removal ?Pulmonology follow-up appreciated.  HRCT Chest results as below appreciated.  ?They suspect that she has chronic right-sided loculated effusions and compressive atelectasis versus RLL consolidation most likely explained by chronic pulmonary hypertension and scarring post surgery.  Also given oxygen need, advanced age, holding off on biopsy/invasive work-up of RLL consolidation because pulmonology suspect that it is inflammatory/postop changes. ?No role seen for thoracentesis and concern for reaccumulation. ? ?Severe pulmonary hypertension ([place]) ?Continue Opsumit and sildenafil ?Follows with Dr. Silas Flood, pulmonology. ? ?Essential hypertension ?Continue carvedilol 12.5 Mg twice daily and hydralazine 25 Mg 3 times daily. ?As needed IV hydralazine. ?Controlled. ? ?Dyslipidemia ?Continue atorvastatin 80 mg daily. ? ?Hyperthyroidism ?Continue Tapazole 2.5 Mg daily. ?No clinical hyperthyroidism. ? ?History of stroke ?Continue Plavix and statin ? ?Elevated troponin ?Suspect due to demand  ischemia from acute respiratory failure and decompensated CHF. ?Continue carvedilol and Plavix. ?As needed sublingual NTG. ?2D echo:  LVEF 55-60%, no regional wall motion abnormalities, grade 2 diastolic dysfunction severe pulmonary hypertension, small pericardial effusion ? ? ?Body mass index is 31.08 kg/m?. ? ? ?DVT prophylaxis: enoxaparin (LOVENOX) injection 30 mg Start: 05/09/21 2245   ?  Code Status: Full Code:  ?Family Communication: I discussed in detail with patient's daughter via phone, updated care and answered all questions.  She was appreciative of the call.  She is aware that patient may discharge tomorrow. ?Disposition:  ?Status is: Inpatient ? ?  ? ?Consultants:   ?Pulmonology ? ?Procedures:   ?CPAP with autotitration from 5/8 night. ? ?Antimicrobials:   ?Not continued after admission. ? ?Subjective:  ?When asked about her dyspnea and breathing, "little better".  "Good". ? ?Objective:  ? ?Vitals:  ? 05/11/21 0520 05/11/21 0750 05/11/21 1143 05/11/21 1426  ?BP: (!) 129/53 (!) 154/68 (!) 127/51 (!) 137/51  ?Pulse: 93 94 83   ?Resp:  (!) 21 20   ?Temp: 99.1 ?F (37.3 ?C) 98.8 ?F (37.1 ?C) 98.5 ?F (36.9 ?C)   ?TempSrc: Oral Oral Oral   ?SpO2: 95% 97% 98%   ?Weight:      ?Height:      ? ? ?General exam: Elderly female, moderately built and obese, propped up in bed with her breakfast tray in front of her, looking improved compared to yesterday without dyspnea while speaking.  Comfortable. ?Respiratory system: Clear to auscultation anteriorly.  Still with slightly harsh and reduced breath sounds posteriorly.  No wheezing or rhonchi.  Few basal crackles.  No increased work of breathing. ?Cardiovascular system: S1 & S2 heard, RRR. No JVD, murmurs, rubs, gallops or clicks.  No leg edema.  Telemetry personally reviewed: Sinus rhythm. ?Gastrointestinal system: Abdomen is nondistended, soft and nontender. No organomegaly or masses felt. Normal bowel sounds heard. ?Central nervous system: Alert and seems oriented. No  focal neurological deficits. ?Extremities: Symmetric 5 x 5 power. ?Skin: No rashes, lesions or ulcers ?Psychiatry: Hard to assess judgment and insight due to significant language barrier. Mood & affect appropriate.  ? ?Data Reviewed:   ?I have personally reviewed following labs and imaging studies ? ?CBC: ?Recent Labs  ?Lab 05/07/21 ?1649  ?WBC 4.9  ?HGB 9.6*  ?HCT 31.8*  ?MCV 82.6  ?PLT 206  ? ?Basic Metabolic Panel: ?Recent Labs  ?Lab 05/07/21 ?1649 05/08/21 ?0119 05/09/21 ?0253 05/10/21 ?0422 05/11/21 ?8119  ?NA 135 137 137 135 135  ?K 4.2 3.5 3.6 3.5 3.6  ?CL 90* 91* 89* 90* 88*  ?CO2 33* 34* 41* 40* 39*  ?GLUCOSE 108* 133* 106* 110* 100*  ?BUN 30* 22 21 26* 29*  ?CREATININE 1.30* 1.00 1.53* 1.43* 1.56*  ?CALCIUM 9.0 8.7* 8.4* 8.5* 8.5*  ?MG 2.0  --   --   --   --   ? ?Liver Function Tests: ?Recent Labs  ?Lab 05/07/21 ?1649  ?AST 18  ?ALT 13  ?ALKPHOS 37*  ?BILITOT 0.5  ?PROT 6.9  ?ALBUMIN 3.6  ? ?CBG: ?No results for input(s): GLUCAP in the last 168 hours. ?Microbiology Studies:  ? ?Recent Results (from the past 240 hour(s))  ?MRSA Next Gen by PCR, Nasal     Status: None  ? Collection Time: 05/07/21  8:58 PM  ? Specimen: Nasal Mucosa; Nasal Swab  ?Result Value Ref Range Status  ? MRSA by PCR Next Gen NOT DETECTED NOT DETECTED Final  ?  Comment: (NOTE) ?The GeneXpert MRSA Assay (FDA approved for NASAL specimens only), ?is one component of a comprehensive MRSA colonization surveillance ?program. It  is not intended to diagnose MRSA infection nor to guide ?or monitor treatment for MRSA infections. ?Test performance is not FDA approved in patients less than 2 years ?old. ?Performed at [facility], [place] [address], [place], [place] ?[address] ?  ?Blood culture (routine x 2)     Status: None (Preliminary result)  ? Collection Time: 05/07/21  9:23 PM  ? Specimen: BLOOD RIGHT HAND  ?Result Value Ref Range Status  ? Specimen Description BLOOD RIGHT HAND  Final  ? Special Requests   Final  ?  BOTTLES DRAWN AEROBIC  AND ANAEROBIC Blood Culture adequate volume  ? Culture   Final  ?  NO GROWTH 4 DAYS ?Performed at [facility], [place] [address] [place], [place] [address] ?  ? Report Status PENDING  Incomplete  ?B

## 2021-05-11 NOTE — Progress Notes (Signed)
0145 Patient called asking to remove the Bipap at this time patient is back on nasal canula 4 L ?

## 2021-05-11 NOTE — TOC Initial Note (Signed)
Transition of Care (TOC) - Initial/Assessment Note  ? ? ?Patient Details  ?Name: Mandy [name] ?MRN: [id number] ?Date of Birth: [birth year] ? ?Transition of Care (TOC) CM/SW Contact:    ?Zenon Mayo, RN ?Phone Number: ?05/11/2021, 4:48 PM ? ?Clinical Narrative:                 ?From home with daughter, NCM offered choice , daughter chose [facility], states they have had them before.  NCM made referral to [facility] with [facility], he is able to take referral.  Soc will begin 24 to 48 hrs post dc. Also she will need a rolling walker, she is ok with [employer] supplying this for her. Daughter will bring oxygen tank when she picks her up at discharge to go home with. ? ?Expected Discharge Plan: [place] ?Barriers to Discharge: No Barriers Identified ? ? ?Patient Goals and CMS Choice ?Patient states their goals for this hospitalization and ongoing recovery are:: return home ?CMS Medicare.gov Compare Post Acute Care list provided to:: Patient Represenative (must comment) ?Choice offered to / list presented to : Adult Children ? ?Expected Discharge Plan and Services ?Expected Discharge Plan: [place] ?  ?Discharge Planning Services: CM Consult ?Post Acute Care Choice: Home Health, Durable Medical Equipment ?Living arrangements for the past 2 months: [place] ?                ?DME Arranged: Walker rolling ?DME Agency: [employer] ?Date DME Agency Contacted: 05/11/21 ?Time DME Agency Contacted: 3428 ?Representative spoke with at DME Agency: Delana Meyer ?HH Arranged: PT ?[place] Agency: [place] ?Date HH Agency Contacted: 05/11/21 ?Time [place]: 7681 ?Representative spoke with at [place]: Tommi Rumps ? ?Prior Living Arrangements/Services ?Living arrangements for the past 2 months: [place] ?Lives with:: Adult Children ?Patient language and need for interpreter reviewed:: Yes ?Do you feel safe going back to the place where you live?: Yes      ?Need for Family  Participation in Patient Care: Yes (Comment) ?Care giver support system in place?: Yes (comment) ?  ?Criminal Activity/Legal Involvement Pertinent to Current Situation/Hospitalization: No - Comment as needed ? ?Activities of Daily Living ?  ?  ? ?Permission Sought/Granted ?  ?  ?   ?   ?   ?   ? ?Emotional Assessment ?Appearance:: Appears stated age ?  ?  ?Orientation: : Oriented to Self, Oriented to Place, Oriented to  Time, Oriented to Situation ?Alcohol / Substance Use: Not Applicable ?Psych Involvement: No (comment) ? ?Admission diagnosis:  SOB (shortness of breath) [R06.02] ?Acute on chronic respiratory failure with hypoxia (HCC) [J96.21] ?Respiratory failure, unspecified chronicity, unspecified whether with hypoxia or hypercapnia ([place]) [J96.90] ?Patient Active Problem List  ? Diagnosis Date Noted  ? Elevated troponin 05/08/2021  ? DOE (dyspnea on exertion) 05/07/2021  ? History of stroke 05/07/2021  ? Cor pulmonale ([place]) 02/03/2021  ? Pressure injury of skin 12/16/2020  ? CHF exacerbation ([place]) 12/14/2020  ? Cerebral thrombosis with cerebral infarction 12/07/2020  ? CVA (cerebral vascular accident) ([place]) 12/06/2020  ? 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 Chest pain of uncertain etiology   ? Severe pulmonary hypertension ([place])   ? Chronic respiratory failure with hypoxia, on home oxygen therapy ([place]) 11/24/2020  ? Anxiety 11/24/2020  ? Acute on chronic respiratory failure with hypoxia and hypercapnia ([place]) 11/23/2020  ? Acute on chronic heart failure with preserved ejection fraction (HFpEF) ([place]) 11/23/2020  ? Hypoxia 11/03/2020  ?  Recurrent right pleural effusion 10/01/2020  ? Hyperkalemia 10/01/2020  ? Acute renal failure superimposed on stage 3a chronic kidney disease ([place]) 10/01/2020  ? Anemia 10/01/2020  ? 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 Acute respiratory failure with hypoxia ([place]) 06/26/2020  ? Hypertensive urgency 06/26/2020  ? Generalized weakness 06/26/2020  ? Dizziness 06/26/2020  ?  Obesity (BMI 30-39.9) 06/26/2020  ? Hyperthyroidism 06/26/2020  ? 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 Hyponatremia 01/31/2019  ? Acute metabolic encephalopathy 93/90/3009  ? Mass of right ovary 10/08/2018  ? Hepatic steatosis 10/08/2018  ? Aortic atherosclerosis ([place]) 10/08/2018  ? Diabetes mellitus type 2, controlled ([place]) 04/07/2008  ? Dyslipidemia 04/07/2008  ? Essential hypertension 04/07/2008  ? SORE THROAT 04/07/2008  ? GERD 04/07/2008  ? ?PCP:  [name], MD ?Pharmacy:   ?[facility], [address] ?[place] ?[place] [address] ?Phone: [phone] Fax: [phone] ? ? ? ? ?Social Determinants of Health (SDOH) Interventions ?  ? ?Readmission Risk Interventions ? ?  05/11/2021  ?  4:46 PM 02/05/2021  ?  9:57 AM 11/06/2020  ?  9:50 AM  ?Readmission Risk Prevention Plan  ?Transportation Screening Complete Complete Complete  ?PCP or Specialist Appt within 3-5 Days   Complete  ?[place] or Home Care Consult   Complete  ?Social Work Consult for [place] Planning/Counseling   Complete  ?Palliative Care Screening   Complete  ?Medication Review Press photographer) Complete Complete Complete  ?PCP or Specialist appointment within 3-5 days of discharge Complete Complete   ?[place] or Home Care Consult Complete Complete   ?SW Recovery Care/Counseling Consult Complete Complete   ?Palliative Care Screening Not Applicable Complete   ?[place] Not Applicable Not Applicable   ? ? ? ?

## 2021-05-11 NOTE — Progress Notes (Signed)
[facility] ? ?Attempted to call daughter ?No answer, could not leave message ? ?Roselie Awkward, MD ?[facility] ?Pager: [phone] ?Cell: [phone] ?After 7:00 pm call Elink  [phone] ? ?

## 2021-05-12 ENCOUNTER — Other Ambulatory Visit (HOSPITAL_COMMUNITY): Payer: Self-pay

## 2021-05-12 ENCOUNTER — Telehealth: Payer: Self-pay | Admitting: Internal Medicine

## 2021-05-12 DIAGNOSIS — J9621 Acute and chronic respiratory failure with hypoxia: Secondary | ICD-10-CM | POA: Diagnosis not present

## 2021-05-12 DIAGNOSIS — J9622 Acute and chronic respiratory failure with hypercapnia: Secondary | ICD-10-CM | POA: Diagnosis not present

## 2021-05-12 LAB — CBC
HCT: 26.7 % — ABNORMAL LOW (ref 36.0–46.0)
Hemoglobin: 8.4 g/dL — ABNORMAL LOW (ref 12.0–15.0)
MCH: 24.6 pg — ABNORMAL LOW (ref 26.0–34.0)
MCHC: 31.5 g/dL (ref 30.0–36.0)
MCV: 78.3 fL — ABNORMAL LOW (ref 80.0–100.0)
Platelets: 221 10*3/uL (ref 150–400)
RBC: 3.41 MIL/uL — ABNORMAL LOW (ref 3.87–5.11)
RDW: 18.2 % — ABNORMAL HIGH (ref 11.5–15.5)
WBC: 3.9 10*3/uL — ABNORMAL LOW (ref 4.0–10.5)
nRBC: 0 % (ref 0.0–0.2)

## 2021-05-12 LAB — BASIC METABOLIC PANEL
Anion gap: 4 — ABNORMAL LOW (ref 5–15)
BUN: 29 mg/dL — ABNORMAL HIGH (ref 8–23)
CO2: 39 mmol/L — ABNORMAL HIGH (ref 22–32)
Calcium: 8.6 mg/dL — ABNORMAL LOW (ref 8.9–10.3)
Chloride: 90 mmol/L — ABNORMAL LOW (ref 98–111)
Creatinine, Ser: 1.29 mg/dL — ABNORMAL HIGH (ref 0.44–1.00)
GFR, Estimated: 42 mL/min — ABNORMAL LOW (ref >60)
Glucose, Bld: 99 mg/dL (ref 70–99)
Potassium: 3.6 mmol/L (ref 3.5–5.1)
Sodium: 133 mmol/L — ABNORMAL LOW (ref 135–145)

## 2021-05-12 LAB — MAGNESIUM: Magnesium: 2.1 mg/dL (ref 1.7–2.4)

## 2021-05-12 LAB — CULTURE, BLOOD (ROUTINE X 2)
Culture: NO GROWTH
Special Requests: ADEQUATE

## 2021-05-12 MED ORDER — SALINE SPRAY 0.65 % NA SOLN
1.0000 | NASAL | Status: DC | PRN
  Administered 2021-05-12: 1 via NASAL
  Filled 2021-05-12: qty 44

## 2021-05-12 MED ORDER — SILDENAFIL CITRATE 20 MG PO TABS: 20.0000 mg | ORAL_TABLET | Freq: Three times a day (TID) | ORAL | 0 refills | Status: DC

## 2021-05-12 MED ORDER — TADALAFIL (PAH) 20 MG PO TABS: 40.0000 mg | ORAL_TABLET | Freq: Every day | ORAL | 5 refills | Status: DC

## 2021-05-12 MED ORDER — CLOPIDOGREL BISULFATE 75 MG PO TABS
75.0000 mg | ORAL_TABLET | Freq: Every day | ORAL | 2 refills | Status: AC
  Filled 2021-05-12: qty 30, 30d supply, fill #0

## 2021-05-12 MED ORDER — FUROSEMIDE 10 MG/ML IJ SOLN
40.0000 mg | Freq: Two times a day (BID) | INTRAMUSCULAR | Status: DC
  Administered 2021-05-12: 40 mg via INTRAVENOUS
  Filled 2021-05-12: qty 4

## 2021-05-12 MED ORDER — TORSEMIDE 40 MG PO TABS: 40.0000 mg | ORAL_TABLET | Freq: Two times a day (BID) | ORAL | 1 refills | Status: DC

## 2021-05-12 NOTE — Progress Notes (Signed)
? ?NAME:  Mandy [name], MRN:  [id number], DOB:  11/08/1940, LOS: 5 ?ADMISSION DATE:  05/07/2021, CONSULTATION DATE:  05/09/21 ?REFERRING MD:  Dr Algis Liming, CHIEF COMPLAINT:  shortness of breath, hypoxemia  ? ?History of Present Illness:  ?81 year old woman who has been followed in our office for severe pulmonary hypertension.  She is a never smoker, has history of systemic hypertension, CHF, CVA, diabetes, hypothyroidism, CKD stage III, obesity.  Pulmonary hypertension confirmed on right heart catheterization with a mean PASP 39 mmHg and PAOP 18, preserved cardiac output 6.1.  Course complicated by pleural effusions, right hydropneumothorax, thoracentesis while hospitalized.  At that time also started on sildenafil in addition to Opsumit. Has had nausea with initiation of the Opsumit.  Plans to change to tadalafil when she saw Dr. Silas Flood in follow-up 04/28/2021 she was on 4 L/min oxygen.  Diuretic regimen furosemide 40 mg twice daily, increased due to progressive shortness of breath on 05/08/2018 3 to 80 mg twice daily.  Chest x-ray suggestive of progressive pulmonary edema.  She was directed to the ED and admitted to the hospital on 5/5. ? ?CT-PA 5/5 reviewed, showed no evidence of PE, smaller partially loculated moderate right pleural effusion, stable small left pleural effusion, bilateral groundglass and septal thickening most consistent with pulmonary edema.  Stable cardiomegaly. ? ?SPO2 has improved, now 100% on 6 L/min nasal cannula ? ? ?Pertinent  Medical History  ? ?Past Medical History:  ?Diagnosis Date  ? Aortic atherosclerosis ([place]) 10/08/2018  ? Arthritis   ? Chronic kidney disease, stage 3a ([place])   ? Chronic respiratory failure ([place])   ? Diabetes mellitus without complication ([place])   ? GERD (gastroesophageal reflux disease)   ? Hepatic steatosis 10/08/2018  ? Hyperlipidemia   ? Hypertension   ? Hyperthyroidism   ? Mass of right ovary 10/08/2018  ? 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 Mild CAD   ? Osteoporosis   ? Pneumonia   ? Renal infarct [facility])   ? SBO (small bowel obstruction) ([place]) 08/09/2018  ? Severe pulmonary hypertension ([place])   ? Stroke (cerebrum) ([place])   ? ? ?Significant Hospital Events: ?Including procedures, antibiotic start and stop dates in addition to other pertinent events   ?CT-PA 5/5 ? ?Interim History / Subjective:  ?Feels better today. Discussed with staff, she was able to walk to the bathroom with walker and did sit up in chair. She got back in bed because her bottom was hurting sitting in chair. But was able to walk around the room without additional dyspnea or hypoxemia. Down to 4L.  ? ?Objective   ?Blood pressure (!) 129/52, pulse 75, temperature 99.1 ?F (37.3 ?C), temperature source Oral, resp. rate 20, height '4\' 11"'$  (1.499 m), weight 70.4 kg, SpO2 100 %. ?   ?   ? ?Intake/Output Summary (Last 24 hours) at 05/12/2021 1344 ?Last data filed at 05/12/2021 1256 ?Gross per 24 hour  ?Intake 776 ml  ?Output 975 ml  ?Net -199 ml  ? ?Filed Weights  ? 05/10/21 0305 05/11/21 0500 05/12/21 0434  ?Weight: 71.4 kg 69.8 kg 70.4 kg  ? ? ?Examination: ?Elderly woman, laying flat comfortably in bed on 4LNC.  ?Breath sounds diminished bilaterally ?Breathing is nonlabored with no wheezes or crackles ?No le edema ?RRR no mrg ? ?Resolved Hospital Problem list   ? ? ?Assessment & Plan:  ?Acute on chronic hypoxemic respiratory failure.   ?Acute Pulmonary Edema ?Decompensated Pulmonary Hypertension, multifactorial.  ?Tracheobronchomalacia ? ?  CT Chest reviewed - confirms excessvive dynamic airway collapse (EDAC.) There is also a left sided loculated effusion which I would manage conservatively at this time. Consider repeat CT scan in a few weeks after optimal volume management. I doubt this will make a dramatic improvement in her symptoms.  ?Continue diuresis today one more time today. She needs to be discharged on her torsemide 40 mg BID instead of daily. This is an increase.  ?Discussed  with Dr. [place] Pink - CPAP will likely need to be set up after she sees [place] in the office. Hopefully can get her a trilogy vent once PFTs are performed.  ?Continue Opsumit sildenafil. She is feeling better since admission.  ?She needs to follow up with [place] in the office within the next two weeks. Will set up.  ?No objection to discharge from pulmonary perspective. ? ?Lenice Llamas, MD ?Pulmonary and Critical Care Medicine ?[place] ?05/12/2021 2:10 PM ?Pager: see AMION ? ?If no response to pager, please call critical care on call (see AMION) until 7pm ?After 7:00 pm call Elink   ? ?

## 2021-05-12 NOTE — TOC Transition Note (Signed)
Transition of Care (TOC) - CM/SW Discharge Note ? ? ?Patient Details  ?Name: Mandy [name] ?MRN: [id number] ?Date of Birth: 11/08/1940 ? ?Transition of Care (TOC) CM/SW Contact:  ?Zenon Mayo, RN ?Phone Number: ?05/12/2021, 1:19 PM ? ? ?Clinical Narrative:    ?Patient is for dc today, NCM notified her niece at [facility] , she wanted to know when she was being discharged so she could call her cousin to pick her up.  She is set up with [facility]. NCM notified [name] with [place]. ? ? ?Final next level of care: [place] ?Barriers to Discharge: No Barriers Identified ? ? ?Patient Goals and CMS Choice ?Patient states their goals for this hospitalization and ongoing recovery are:: return home ?CMS Medicare.gov Compare Post Acute Care list provided to:: Patient Represenative (must comment) ?Choice offered to / list presented to : Adult Children ? ?Discharge Placement ?  ?           ?  ?  ?  ?  ? ?Discharge Plan and Services ?  ?Discharge Planning Services: CM Consult ?Post Acute Care Choice: Home Health, Durable Medical Equipment          ?DME Arranged: [place] ?DME Agency: [employer] ?Date DME Agency Contacted: 05/11/21 ?Time DME Agency Contacted: [phone] ?Representative spoke with at DME Agency: Delana Meyer ?HH Arranged: PT ?[place] Agency: [place] ?Date HH Agency Contacted: 05/11/21 ?Time [place]: [phone] ?Representative spoke with at [place]: Tommi Rumps ? ?Social Determinants of Health (SDOH) Interventions ?  ? ? ?Readmission Risk Interventions ? ?  05/11/2021  ?  4:46 PM 02/05/2021  ?  9:57 AM 11/06/2020  ?  9:50 AM  ?Readmission Risk Prevention Plan  ?Transportation Screening Complete Complete Complete  ?PCP or Specialist Appt within 3-5 Days   Complete  ?[place] or Home Care Consult   Complete  ?Social Work Consult for [place] Planning/Counseling   Complete  ?Palliative Care Screening   Complete  ?Medication Review Press photographer) Complete Complete Complete  ?PCP or  Specialist appointment within 3-5 days of discharge Complete Complete   ?[place] or Home Care Consult Complete Complete   ?SW Recovery Care/Counseling Consult Complete Complete   ?Palliative Care Screening Not Applicable Complete   ?[place] Not Applicable Not Applicable   ? ? ? ? ? ?

## 2021-05-12 NOTE — Discharge Summary (Signed)
?[facility] ? ?Physician Discharge Summary  ? ?Patient ID: ?Mandy [name] ?MRN: [id number] ?DOB/AGE: 80-Sep-1942 80 y.o. ? ?Admit date: 05/07/2021 ?Discharge date:   05/12/2021 ? ? ?PCP: [name], MD ? ?DISCHARGE DIAGNOSES:  ?Principal Problem: ?  Acute on chronic respiratory failure with hypoxia and hypercapnia (HCC) ?Active Problems: ?  Acute renal failure superimposed on stage 3a chronic kidney disease ([place]) ?  Cor pulmonale (HCC) ?  Recurrent right pleural effusion ?  Severe pulmonary hypertension ([place]) ?  Essential hypertension ?  Dyslipidemia ?  Hyperthyroidism ?  History of stroke ?  Elevated troponin ? ? ?RECOMMENDATIONS FOR OUTPATIENT FOLLOW UP: ?Pulmonology to arrange outpatient follow-up ?Home health has been ordered ? ? ?Home Health: PT ?Equipment/Devices: Patient has oxygen at home ? ?CODE STATUS: Full code ? ?DISCHARGE CONDITION: fair ? ?Diet recommendation: As before ? ?INITIAL HISTORY: ?81 year old female, speaks [place] Creole and very little English, medical history significant for severe pulmonary hypertension, chronic respiratory failure with hypoxia and hypercapnia on home oxygen 4 L/min, chronic diastolic CHF, HTN, HLD, type II DM, CKD stage IIIa, GERD, mild CAD, CVA, hyperthyroidism on methimazole, seen at at her pulmonologist office on day of admission for progressive worsening dyspnea on exertion for past several days along with dry cough and some chest pain.  Symptoms persisted despite doubling home dose of torsemide.  Directed to ED by pulmonology.  In ED, tachycardic, tachypneic and required 6 L/min [place] oxygen.  CTA chest negative for PE but showed small to moderate size right-sided pleural effusion, loculated and multiple groundglass opacities seen throughout the mid and upper lung that may be infection versus pulmonary edema.  Admitted for acute on chronic respiratory failure with hypoxia and hypercapnia secondary to acute on chronic diastolic CHF, recurrent right pleural  effusion complicating underlying severe pulmonary hypertension.  Pulmonology consulted.  Treating with IV Lasix for decompensated cor pulmonale.  Plan to DC home with CPAP at bedtime.  Possible DC home 5/10. ? ?Consultations: ?Pulmonology ? ? ? ?HOSPITAL COURSE:  ? ?* Acute on chronic respiratory failure with hypoxia and hypercapnia (HCC) ?Due to decompensated cor pulmonale, pulmonary hypertension, tracheobronchomalacia, chronic right pleural effusions and chronic diastolic CHF. ?At baseline on home oxygen 4 L/min.  Patient was requiring 6 L of oxygen nasal cannula.  She was slowly weaned down to 4 L.  Pulmonology will try to arrange CPAP or BiPAP for home.  This will be done in the outpatient setting. ? ?Acute renal failure superimposed on stage 3a chronic kidney disease ([place]) ?Prior baseline creatinine 1.14.   ?Creatinine fluctuated during this hospital stay.  Stable at the time of discharge. ? ?Cor pulmonale ([facility]) ?Decompensated cor pulmonale ?Echo 02/14/2021: LVEF 55-60%, moderate concentric LVH, circumferential pericardial effusion, moderate left pleural effusion, severe pulmonary artery hypertension. ?Treated briefly with IV Lasix.  She may continue her home dose of torsemide.  Pulmonology recommends increasing it to twice a day. ?Echo 05/08/2021: LVEF 55-60%, severe LVH, grade 2 diastolic dysfunction, severe pulmonary hypertension and small pericardial effusion. ?Continued on sildenafil and macitentan. ?Follow-up with pulmonology in the outpatient setting. ? ?Recurrent right pleural effusion ?Multiple loculated right effusions ?Last therapeutic thoracentesis done in 02/2021 with over 700cc fluid removal ?Pulmonology follow-up appreciated.  HRCT Chest results as below appreciated.  ?They suspect that she has chronic right-sided loculated effusions and compressive atelectasis versus RLL consolidation most likely explained by chronic pulmonary hypertension and scarring post surgery.  Also given oxygen need, advanced  age, holding off on biopsy/invasive work-up of RLL  consolidation because pulmonology suspect that it is inflammatory/postop changes. ?No role seen for thoracentesis and concern for reaccumulation. ? ?Severe pulmonary hypertension ([place]) ?Continue Opsumit and sildenafil ?Follows with Dr. Silas Flood, pulmonology. ? ?Essential hypertension ?Continue carvedilol 12.5 Mg twice daily and hydralazine 25 Mg 3 times daily. ? ?Dyslipidemia ?Continue atorvastatin 80 mg daily. ? ?Hyperthyroidism ?Continue Tapazole 2.5 Mg daily. ?No clinical hyperthyroidism. ? ?History of stroke ?Continue Plavix and statin ? ?Elevated troponin ?Suspect due to demand ischemia from acute respiratory failure and decompensated CHF. ?Continue carvedilol and Plavix. ?As needed sublingual NTG. ?2D echo: LVEF 55-60%, no regional wall motion abnormalities, grade 2 diastolic dysfunction severe pulmonary hypertension, small pericardial effusion ? ? ?Obesity ?Estimated body mass index is 31.35 kg/m? as calculated from the following: ?  Height as of this encounter: '4\' 11"'$  (1.499 m). ?  Weight as of this encounter: 70.4 kg. ? ?Patient is stable.  She is down to 4 L of oxygen by nasal cannula saturating in the 90s.  Discussed with pulmonology.  Okay for discharge home today. ? ?PERTINENT LABS: ? ?The results of significant diagnostics from this hospitalization (including imaging, microbiology, ancillary and laboratory) are listed below for reference.   ? ?Microbiology: ?Recent Results (from the past 240 hour(s))  ?MRSA Next Gen by PCR, Nasal     Status: None  ? Collection Time: 05/07/21  8:58 PM  ? Specimen: Nasal Mucosa; Nasal Swab  ?Result Value Ref Range Status  ? MRSA by PCR Next Gen NOT DETECTED NOT DETECTED Final  ?  Comment: (NOTE) ?The GeneXpert MRSA Assay (FDA approved for NASAL specimens only), ?is one component of a comprehensive MRSA colonization surveillance ?program. It is not intended to diagnose MRSA infection nor to guide ?or monitor treatment  for MRSA infections. ?Test performance is not FDA approved in patients less than 2 years ?old. ?Performed at [facility], [place] [address] [place], [place] ?[address] ?  ?Blood culture (routine x 2)     Status: None  ? Collection Time: 05/07/21  9:23 PM  ? Specimen: BLOOD RIGHT HAND  ?Result Value Ref Range Status  ? Specimen Description BLOOD RIGHT HAND  Final  ? Special Requests   Final  ?  BOTTLES DRAWN AEROBIC AND ANAEROBIC Blood Culture adequate volume  ? Culture   Final  ?  NO GROWTH 5 DAYS ?Performed at [facility], [place] [address], [place], [place] [address] ?  ? Report Status 05/12/2021 FINAL  Final  ?Blood culture (routine x 2)     Status: None (Preliminary result)  ? Collection Time: 05/08/21  1:19 AM  ? Specimen: BLOOD  ?Result Value Ref Range Status  ? Specimen Description BLOOD BLOOD LEFT HAND  Final  ? Special Requests   Final  ?  BOTTLES DRAWN AEROBIC ONLY Blood Culture results may not be optimal due to an inadequate volume of blood received in culture bottles  ? Culture   Final  ?  NO GROWTH 4 DAYS ?Performed at [facility], [place] [address] [place], [place] [address] ?  ? Report Status PENDING  Incomplete  ?  ? ?Labs: ? ?COVID-19 Labs ? ? ?Lab Results  ?Component Value Date  ? [place] NEGATIVE 02/03/2021  ? [place] NEGATIVE 12/14/2020  ? [place] NEGATIVE 11/23/2020  ? [place] NEGATIVE 11/03/2020  ? ? ? ? ?Basic Metabolic Panel: ?Recent Labs  ?Lab 05/07/21 ?1649 05/08/21 ?0119 05/09/21 ?0253 05/10/21 ?0422 05/11/21 ?8756 05/12/21 ?4332  ?NA 135 137 137 135 135  133*  ?K 4.2 3.5 3.6 3.5 3.6 3.6  ?CL 90* 91* 89* 90* 88* 90*  ?CO2 33* 34* 41* 40* 39* 39*  ?GLUCOSE 108* 133* 106* 110* 100* 99  ?BUN 30* 22 21 26* 29* 29*  ?CREATININE 1.30* 1.00 1.53* 1.43* 1.56* 1.29*  ?CALCIUM 9.0 8.7* 8.4* 8.5* 8.5* 8.6*  ?MG 2.0  --   --   --   --  2.1  ? ?Liver Function Tests: ?Recent Labs  ?Lab 05/07/21 ?1649  ?AST 18  ?ALT 13  ?ALKPHOS 37*  ?BILITOT 0.5  ?PROT 6.9  ?ALBUMIN 3.6   ? ? ?CBC: ?Recent Labs  ?Lab 05/07/21 ?1649 05/12/21 ?5830  ?WBC 4.9 3.9*  ?HGB 9.6* 8.4*  ?HCT 31.8* 26.7*  ?MCV 82.6 78.3*  ?PLT 206 221  ? ? ?BNP: ?BNP (last 3 results) ?Recent Labs  ?  02/06/21 ?9407 0

## 2021-05-12 NOTE — Telephone Encounter (Signed)
Needs follow up with MH or APP in the next two weeks please. Post hospital discharge.  ?

## 2021-05-13 LAB — CULTURE, BLOOD (ROUTINE X 2): Culture: NO GROWTH

## 2021-05-13 NOTE — Progress Notes (Signed)
?Cardiology Office Note:   ? ?Date:  05/14/2021  ? ?IDWilhelmenia [name], DOB 03/04/1940, MRN [id number] ? ?PCP:  [name], MD ?  ?[facility] Providers ?Cardiologist:  Donato Heinz, MD    ? ?Referring MD: [name], MD  ? ?Chief Complaint  ?Patient presents with  ? Hospitalization Follow-up  ?  Pulmonary hypertension  ? ? ?History of Present Illness:   ? ?Mandy [name] is a 81 y.o. 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  Right and left heart catheterization 11/22/2020 with nonobstructive CAD and moderate pulmonary hypertension.  She has had multiple admissions for acute on chronic respiratory failure.  She was hospitalized February 2023 with acute on chronic hypoxic/hypercapnic respiratory failure, severe pulmonary hypertension, acute on chronic diastolic heart failure and RV failure.  She required milrinone with transition to sildenafil.  She was diuresed and ultimately discharged on torsemide.  Most recent echocardiogram 02/15/2021 with an LVEF 55 to 60%, moderate LVH, severely elevated pulmonary systolic pressure, and moderate TR.  She saw Dr. Nechama Guard in clinic follow-up on 04/07/2021 and was doing well at that time her daughter was helping monitor daily weights.  Unfortunately she was hospitalized again on 05/07/2021 for acute on chronic respiratory failure with hypoxia and hypercapnia.  She presented with increasing shortness of breath and the daughter had doubled her torsemide at home without relief.  She had worsening nausea with the start of Opsumit about 2 weeks prior.  She was also treated with vancomycin and cefepime for presumed pneumonia.  She was discharged on 05/12/2021 after IV Lasix and new CPAP at bedtime. ? ?She presents for  hospital follow-up. She has been taking 40 mg torsemide BID. She continues to have shortness of breath. She is back on opsimut. She states she has gained weight since starting opsimut. She is 3 lbs up this morning (158 --> 161), she is 160 lbs in clinic. Apparently they are awaiting tadalafil to arrive and have been instructed to stop sildenafil. CPAP has not been delivered to the house yet.   ? ?Daughter gave her 80 mg torsemide BID yesterday and is concerned that her weight went up overnight. Pt states she only urinated twice yesterday.  She also took 80 mg torsemide this morning. She is drinking 4 bottles of water daily, but did not drink much yesterday. She is urinating more today. Daughter states her breathing is worse today. Pt appears comfortable in the chair today, on 4L [place]. She is hypertensive but has not taken second dose of hydralazine yet.  ? ? ?Past Medical History:  ?Diagnosis Date  ? Aortic atherosclerosis ([place]) 10/08/2018  ? Arthritis   ? Chronic kidney disease, stage 3a ([place])   ? Chronic respiratory failure ([place])   ? Diabetes mellitus without complication ([place])   ? GERD (gastroesophageal reflux disease)   ? Hepatic steatosis 10/08/2018  ? Hyperlipidemia   ? Hypertension   ? Hyperthyroidism   ? Mass of right ovary 10/08/2018  ? 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 Mild CAD   ? Osteoporosis   ? Pneumonia   ? Renal infarct [facility])   ? SBO (small bowel obstruction) ([place]) 08/09/2018  ? Severe pulmonary hypertension ([place])   ? Stroke (cerebrum) ([place])   ? ? ?Past Surgical History:  ?  Procedure Laterality Date  ? CATARACT EXTRACTION W/ INTRAOCULAR LENS  IMPLANT, BILATERAL    ? COLONOSCOPY    ? LEFT HEART CATH AND CORONARY ANGIOGRAPHY N/A 11/30/2020  ? Procedure: LEFT HEART CATH AND CORONARY ANGIOGRAPHY;  Surgeon: Early Osmond, MD;  Location: [facility];  Service: Cardiovascular;  Laterality: N/A;  ? RIGHT/LEFT HEART CATH AND CORONARY ANGIOGRAPHY N/A 11/30/2020  ?  Procedure: RIGHT/LEFT HEART CATH AND CORONARY ANGIOGRAPHY;  Surgeon: Early Osmond, MD;  Location: [facility];  Service: Cardiovascular;  Laterality: N/A;  ? ? ?Current Medications: ?Current Meds  ?Medication Sig  ? acetaminophen (TYLENOL) 500 MG tablet Take 1,000 mg by mouth every 6 (six) hours as needed for fever or headache (pain).  ? albuterol (PROVENTIL) (2.5 MG/3ML) 0.083% nebulizer solution Take 2.5 mg by nebulization every 6 (six) hours as needed for wheezing or shortness of breath.  ? albuterol (VENTOLIN HFA) 108 (90 Base) MCG/ACT inhaler Inhale 2 puffs into the lungs every 6 (six) hours as needed for wheezing or shortness of breath.  ? alendronate (FOSAMAX) 70 MG tablet Take 70 mg by mouth once a week.  ? atorvastatin (LIPITOR) 80 MG tablet Take 1 tablet (80 mg total) by mouth every evening.  ? calcium carbonate (OSCAL) 1500 (600 Ca) MG TABS tablet Take 600 mg of elemental calcium by mouth 2 (two) times daily with a meal.  ? carvedilol (COREG) 12.5 MG tablet Take 1 tablet (12.5 mg total) by mouth 2 (two) times daily with a meal.  ? cetirizine (ZYRTEC) 10 MG tablet Take 10 mg by mouth daily.  ? clopidogrel (PLAVIX) 75 MG tablet Take 1 tablet (75 mg total) by mouth daily.  ? diclofenac sodium (VOLTAREN) 1 % GEL Apply 4 g topically 4 (four) times daily as needed for pain. shoulder  ? fluticasone (FLONASE) 50 MCG/ACT nasal spray Place 1 spray into both nostrils daily. (Patient taking differently: Place 1 spray into both nostrils daily as needed for allergies.)  ? gabapentin (NEURONTIN) 100 MG capsule Take 100 mg by mouth at bedtime.  ? hydrALAZINE (APRESOLINE) 100 MG tablet Take 50 mg by mouth 3 (three) times daily.  ? macitentan (OPSUMIT) 10 MG tablet Take 1 tablet (10 mg total) by mouth daily.  ? melatonin 5 MG TABS Take 10 mg by mouth at bedtime.  ? methimazole (TAPAZOLE) 5 MG tablet Take 0.5 tablets (2.5 mg total) by mouth daily.  ? Multiple Vitamin (MULTIVITAMIN WITH MINERALS) TABS tablet Take  1 tablet by mouth every morning.  ? Omega-3 Fatty Acids (FISH OIL PO) Take 1 capsule by mouth every morning.  ? OXYGEN Inhale 4 L into the lungs continuous.  ? pantoprazole (PROTONIX) 40 MG tablet Take 1 tablet (40 mg total) by mouth daily at 6 (six) AM. (Patient taking differently: Take 40 mg by mouth every morning.)  ? potassium chloride SA (KLOR-CON M) 20 MEQ tablet Take 1 tablet (20 mEq total) by mouth daily.  ? sildenafil (REVATIO) 20 MG tablet Take 1 tablet (20 mg total) by mouth 3 (three) times daily. Continue sildenafil '20mg'$  by mouth three times daily until tadalafil is received from mail order pharmacy  ? tadalafil, PAH, (ADCIRCA) 20 MG tablet Take 2 tablets (40 mg total) by mouth daily. Start tadalafil when received from mail order pharmacy. STOP SILDENAFIL when you start tadalafil. Do NOT take both at the same time.  ? Torsemide 40 MG TABS Take 40 mg by mouth 2 (two) times daily.  ? vitamin B-12 (  CYANOCOBALAMIN) 1000 MCG tablet Take 1,000 mcg by mouth daily.  ? vitamin C (ASCORBIC ACID) 500 MG tablet Take 500 mg by mouth daily.  ? Vitamin D, Cholecalciferol, 25 MCG (1000 UT) TABS Take 1,000 Units by mouth daily.  ? zinc gluconate 50 MG tablet Take 50 mg by mouth daily.  ?  ? ?Allergies:   Patient has no known allergies.  ? ?Social History  ? ?Socioeconomic History  ? Marital status: Widowed  ?  Spouse name: Not on file  ? Number of children: Not on file  ? Years of education: Not on file  ? Highest education level: Not on file  ?Occupational History  ? Not on file  ?Tobacco Use  ? Smoking status: Never  ? Smokeless tobacco: Never  ?Vaping Use  ? Vaping Use: Never used  ?Substance and Sexual Activity  ? Alcohol use: No  ? Drug use: No  ? Sexual activity: Not on file  ?Other Topics Concern  ? Not on file  ?Social History Narrative  ? Patient is from [place]  ? Speaks [place]  ? ?Social Determinants of Health  ? ?Financial Resource Strain: Not on file  ?Food Insecurity: Not on file  ?Transportation Needs:  Not on file  ?Physical Activity: Not on file  ?Stress: Not on file  ?Social Connections: Not on file  ?  ? ?Family History: ?The patient's family history includes Kidney disease in her son. There is no his

## 2021-05-14 ENCOUNTER — Encounter: Payer: Self-pay | Admitting: Physician Assistant

## 2021-05-14 ENCOUNTER — Ambulatory Visit (INDEPENDENT_AMBULATORY_CARE_PROVIDER_SITE_OTHER): Payer: Medicare HMO | Admitting: Physician Assistant

## 2021-05-14 VITALS — BP 159/70 | HR 75 | Ht 59.0 in | Wt 160.0 lb

## 2021-05-14 DIAGNOSIS — I63412 Cerebral infarction due to embolism of left middle cerebral artery: Secondary | ICD-10-CM | POA: Diagnosis not present

## 2021-05-14 DIAGNOSIS — I272 Pulmonary hypertension, unspecified: Secondary | ICD-10-CM | POA: Diagnosis not present

## 2021-05-14 DIAGNOSIS — I5032 Chronic diastolic (congestive) heart failure: Secondary | ICD-10-CM

## 2021-05-14 DIAGNOSIS — Z9981 Dependence on supplemental oxygen: Secondary | ICD-10-CM | POA: Diagnosis not present

## 2021-05-14 DIAGNOSIS — J9611 Chronic respiratory failure with hypoxia: Secondary | ICD-10-CM | POA: Diagnosis not present

## 2021-05-14 DIAGNOSIS — E785 Hyperlipidemia, unspecified: Secondary | ICD-10-CM

## 2021-05-14 DIAGNOSIS — N1831 Chronic kidney disease, stage 3a: Secondary | ICD-10-CM

## 2021-05-14 NOTE — Patient Instructions (Addendum)
Medication Instructions:  ?CONTINUE Torsemide 80 mg 2 times a day through Sunday. On Monday (05/17/21) take Torsemide 80 mg in the mornings and 40 mg at 3 PM  ?If weight gain please give Pulmonary office a call.  ?*If you need a refill on your cardiac medications before your next appointment, please call your pharmacy* ? ?Lab Work: ?NONE ordered at this time of appointment  ? ?If you have labs (blood work) drawn today and your tests are completely normal, you will receive your results only by: ?MyChart Message (if you have MyChart) OR ?A paper copy in the mail ?If you have any lab test that is abnormal or we need to change your treatment, we will call you to review the results. ? ?Testing/Procedures: ?NONE ordered at this time of appointment  ? ?Follow-Up: ?At [facility], you and your health needs are our priority.  As part of our continuing mission to provide you with exceptional heart care, we have created designated Provider Care Teams.  These Care Teams include your primary Cardiologist (physician) and Advanced Practice Providers (APPs -  Physician Assistants and Nurse Practitioners) who all work together to provide you with the care you need, when you need it. ? ?We recommend signing up for the patient portal called "MyChart".  Sign up information is provided on this After Visit Summary.  MyChart is used to connect with patients for Virtual Visits (Telemedicine).  Patients are able to view lab/test results, encounter notes, upcoming appointments, etc.  Non-urgent messages can be sent to your provider as well.   ?To learn more about what you can do with MyChart, go to [email or web address].   ? ?Your next appointment:   ?2 month(s) ? ?The format for your next appointment:   ?In Person ? ?Provider:   ?Donato Heinz, MD   ? ? ?Other Instructions ? ? ?Important Information About Sugar ? ? ? ? ? ? ?

## 2021-05-22 ENCOUNTER — Emergency Department (HOSPITAL_COMMUNITY): Payer: Medicare HMO

## 2021-05-22 ENCOUNTER — Inpatient Hospital Stay (HOSPITAL_COMMUNITY)
Admission: EM | Admit: 2021-05-22 | Discharge: 2021-05-25 | DRG: 291 | Disposition: A | Discharge status: Home Health | Payer: Medicare HMO | Attending: Internal Medicine | Admitting: Internal Medicine

## 2021-05-22 ENCOUNTER — Encounter (HOSPITAL_COMMUNITY): Payer: Self-pay | Admitting: Emergency Medicine

## 2021-05-22 DIAGNOSIS — K219 Gastro-esophageal reflux disease without esophagitis: Secondary | ICD-10-CM | POA: Diagnosis present

## 2021-05-22 DIAGNOSIS — I700 Atherosclerosis of aorta: Secondary | ICD-10-CM | POA: Diagnosis present

## 2021-05-22 DIAGNOSIS — K760 Fatty (change of) liver, not elsewhere classified: Secondary | ICD-10-CM | POA: Diagnosis present

## 2021-05-22 DIAGNOSIS — I2781 Cor pulmonale (chronic): Secondary | ICD-10-CM | POA: Diagnosis present

## 2021-05-22 DIAGNOSIS — Z6832 Body mass index (BMI) 32.0-32.9, adult: Secondary | ICD-10-CM

## 2021-05-22 DIAGNOSIS — I071 Rheumatic tricuspid insufficiency: Secondary | ICD-10-CM | POA: Diagnosis present

## 2021-05-22 DIAGNOSIS — R531 Weakness: Secondary | ICD-10-CM | POA: Diagnosis not present

## 2021-05-22 DIAGNOSIS — I5032 Chronic diastolic (congestive) heart failure: Secondary | ICD-10-CM | POA: Diagnosis present

## 2021-05-22 DIAGNOSIS — I2721 Secondary pulmonary arterial hypertension: Secondary | ICD-10-CM | POA: Diagnosis present

## 2021-05-22 DIAGNOSIS — Z515 Encounter for palliative care: Secondary | ICD-10-CM

## 2021-05-22 DIAGNOSIS — J9622 Acute and chronic respiratory failure with hypercapnia: Secondary | ICD-10-CM | POA: Diagnosis present

## 2021-05-22 DIAGNOSIS — I10 Essential (primary) hypertension: Secondary | ICD-10-CM | POA: Diagnosis not present

## 2021-05-22 DIAGNOSIS — E66811 Obesity, class 1: Secondary | ICD-10-CM | POA: Diagnosis present

## 2021-05-22 DIAGNOSIS — Z7983 Long term (current) use of bisphosphonates: Secondary | ICD-10-CM

## 2021-05-22 DIAGNOSIS — M810 Age-related osteoporosis without current pathological fracture: Secondary | ICD-10-CM | POA: Diagnosis present

## 2021-05-22 DIAGNOSIS — I130 Hypertensive heart and chronic kidney disease with heart failure and stage 1 through stage 4 chronic kidney disease, or unspecified chronic kidney disease: Principal | ICD-10-CM | POA: Diagnosis present

## 2021-05-22 DIAGNOSIS — Z7902 Long term (current) use of antithrombotics/antiplatelets: Secondary | ICD-10-CM

## 2021-05-22 DIAGNOSIS — Z8673 Personal history of transient ischemic attack (TIA), and cerebral infarction without residual deficits: Secondary | ICD-10-CM

## 2021-05-22 DIAGNOSIS — N1831 Chronic kidney disease, stage 3a: Secondary | ICD-10-CM | POA: Diagnosis present

## 2021-05-22 DIAGNOSIS — I272 Pulmonary hypertension, unspecified: Secondary | ICD-10-CM | POA: Diagnosis not present

## 2021-05-22 DIAGNOSIS — I5082 Biventricular heart failure: Secondary | ICD-10-CM | POA: Diagnosis present

## 2021-05-22 DIAGNOSIS — I3139 Other pericardial effusion (noninflammatory): Secondary | ICD-10-CM | POA: Diagnosis present

## 2021-05-22 DIAGNOSIS — Z7189 Other specified counseling: Secondary | ICD-10-CM

## 2021-05-22 DIAGNOSIS — I509 Heart failure, unspecified: Principal | ICD-10-CM

## 2021-05-22 DIAGNOSIS — N179 Acute kidney failure, unspecified: Secondary | ICD-10-CM | POA: Diagnosis not present

## 2021-05-22 DIAGNOSIS — E785 Hyperlipidemia, unspecified: Secondary | ICD-10-CM | POA: Diagnosis not present

## 2021-05-22 DIAGNOSIS — R0602 Shortness of breath: Secondary | ICD-10-CM | POA: Diagnosis present

## 2021-05-22 DIAGNOSIS — E669 Obesity, unspecified: Secondary | ICD-10-CM | POA: Diagnosis not present

## 2021-05-22 DIAGNOSIS — T447X5A Adverse effect of beta-adrenoreceptor antagonists, initial encounter: Secondary | ICD-10-CM | POA: Diagnosis present

## 2021-05-22 DIAGNOSIS — E1122 Type 2 diabetes mellitus with diabetic chronic kidney disease: Secondary | ICD-10-CM | POA: Diagnosis present

## 2021-05-22 DIAGNOSIS — J9621 Acute and chronic respiratory failure with hypoxia: Secondary | ICD-10-CM | POA: Diagnosis present

## 2021-05-22 DIAGNOSIS — E873 Alkalosis: Secondary | ICD-10-CM | POA: Diagnosis not present

## 2021-05-22 DIAGNOSIS — I5033 Acute on chronic diastolic (congestive) heart failure: Secondary | ICD-10-CM | POA: Diagnosis present

## 2021-05-22 DIAGNOSIS — J9809 Other diseases of bronchus, not elsewhere classified: Secondary | ICD-10-CM | POA: Diagnosis present

## 2021-05-22 DIAGNOSIS — Z79899 Other long term (current) drug therapy: Secondary | ICD-10-CM

## 2021-05-22 DIAGNOSIS — E0590 Thyrotoxicosis, unspecified without thyrotoxic crisis or storm: Secondary | ICD-10-CM | POA: Diagnosis present

## 2021-05-22 DIAGNOSIS — I2510 Atherosclerotic heart disease of native coronary artery without angina pectoris: Secondary | ICD-10-CM | POA: Diagnosis present

## 2021-05-22 DIAGNOSIS — M1990 Unspecified osteoarthritis, unspecified site: Secondary | ICD-10-CM | POA: Diagnosis present

## 2021-05-22 DIAGNOSIS — Z9981 Dependence on supplemental oxygen: Secondary | ICD-10-CM

## 2021-05-22 DIAGNOSIS — Z79890 Hormone replacement therapy (postmenopausal): Secondary | ICD-10-CM

## 2021-05-22 LAB — CBC
HCT: 31 % — ABNORMAL LOW (ref 36.0–46.0)
Hemoglobin: 9.4 g/dL — ABNORMAL LOW (ref 12.0–15.0)
MCH: 24.7 pg — ABNORMAL LOW (ref 26.0–34.0)
MCHC: 30.3 g/dL (ref 30.0–36.0)
MCV: 81.4 fL (ref 80.0–100.0)
Platelets: 263 10*3/uL (ref 150–400)
RBC: 3.81 MIL/uL — ABNORMAL LOW (ref 3.87–5.11)
RDW: 18.2 % — ABNORMAL HIGH (ref 11.5–15.5)
WBC: 5.4 10*3/uL (ref 4.0–10.5)
nRBC: 0 % (ref 0.0–0.2)

## 2021-05-22 LAB — BASIC METABOLIC PANEL
Anion gap: 8 (ref 5–15)
BUN: 26 mg/dL — ABNORMAL HIGH (ref 8–23)
CO2: 36 mmol/L — ABNORMAL HIGH (ref 22–32)
Calcium: 9.3 mg/dL (ref 8.9–10.3)
Chloride: 88 mmol/L — ABNORMAL LOW (ref 98–111)
Creatinine, Ser: 1.33 mg/dL — ABNORMAL HIGH (ref 0.44–1.00)
GFR, Estimated: 40 mL/min — ABNORMAL LOW (ref >60)
Glucose, Bld: 134 mg/dL — ABNORMAL HIGH (ref 70–99)
Potassium: 4.6 mmol/L (ref 3.5–5.1)
Sodium: 132 mmol/L — ABNORMAL LOW (ref 135–145)

## 2021-05-22 LAB — TROPONIN I (HIGH SENSITIVITY)
Troponin I (High Sensitivity): 86 ng/L — ABNORMAL HIGH (ref <18)
Troponin I (High Sensitivity): 96 ng/L — ABNORMAL HIGH (ref <18)

## 2021-05-22 LAB — BRAIN NATRIURETIC PEPTIDE: B Natriuretic Peptide: 176.8 pg/mL — ABNORMAL HIGH (ref 0.0–100.0)

## 2021-05-22 MED ORDER — MACITENTAN 10 MG PO TABS
10.0000 mg | ORAL_TABLET | Freq: Every day | ORAL | Status: DC
  Filled 2021-05-22: qty 1

## 2021-05-22 MED ORDER — FUROSEMIDE 10 MG/ML IJ SOLN
80.0000 mg | Freq: Two times a day (BID) | INTRAMUSCULAR | Status: DC
  Administered 2021-05-22 – 2021-05-25 (×6): 80 mg via INTRAVENOUS
  Filled 2021-05-22 (×6): qty 8

## 2021-05-22 MED ORDER — LORATADINE 10 MG PO TABS
10.0000 mg | ORAL_TABLET | Freq: Every day | ORAL | Status: DC
  Administered 2021-05-23 – 2021-05-25 (×3): 10 mg via ORAL
  Filled 2021-05-22 (×3): qty 1

## 2021-05-22 MED ORDER — TRAZODONE HCL 50 MG PO TABS
25.0000 mg | ORAL_TABLET | Freq: Every evening | ORAL | Status: DC | PRN
  Administered 2021-05-24: 25 mg via ORAL
  Filled 2021-05-22: qty 1

## 2021-05-22 MED ORDER — MELATONIN 5 MG PO TABS
10.0000 mg | ORAL_TABLET | Freq: Every day | ORAL | Status: DC
Start: 2021-05-22 — End: 2021-05-25
  Administered 2021-05-23 – 2021-05-24 (×2): 10 mg via ORAL
  Filled 2021-05-22 (×2): qty 2

## 2021-05-22 MED ORDER — BISACODYL 5 MG PO TBEC: 5.0000 mg | DELAYED_RELEASE_TABLET | Freq: Every day | ORAL | Status: DC | PRN

## 2021-05-22 MED ORDER — GABAPENTIN 100 MG PO CAPS
100.0000 mg | ORAL_CAPSULE | Freq: Every day | ORAL | Status: DC
  Administered 2021-05-23 – 2021-05-24 (×2): 100 mg via ORAL
  Filled 2021-05-22 (×2): qty 1

## 2021-05-22 MED ORDER — IPRATROPIUM-ALBUTEROL 0.5-2.5 (3) MG/3ML IN SOLN
3.0000 mL | RESPIRATORY_TRACT | Status: DC
  Administered 2021-05-22 (×2): 3 mL via RESPIRATORY_TRACT
  Filled 2021-05-22 (×2): qty 3

## 2021-05-22 MED ORDER — ONDANSETRON HCL 4 MG PO TABS: 4.0000 mg | ORAL_TABLET | Freq: Four times a day (QID) | ORAL | Status: DC | PRN

## 2021-05-22 MED ORDER — GUAIFENESIN ER 600 MG PO TB12: 600.0000 mg | ORAL_TABLET | Freq: Two times a day (BID) | ORAL | Status: DC | PRN

## 2021-05-22 MED ORDER — ATORVASTATIN CALCIUM 40 MG PO TABS
80.0000 mg | ORAL_TABLET | Freq: Every evening | ORAL | Status: DC
  Administered 2021-05-23 – 2021-05-24 (×2): 80 mg via ORAL
  Filled 2021-05-22 (×2): qty 2

## 2021-05-22 MED ORDER — HYDRALAZINE HCL 50 MG PO TABS
50.0000 mg | ORAL_TABLET | Freq: Three times a day (TID) | ORAL | Status: DC
  Administered 2021-05-23 – 2021-05-25 (×7): 50 mg via ORAL
  Filled 2021-05-22 (×7): qty 1

## 2021-05-22 MED ORDER — POLYETHYLENE GLYCOL 3350 17 G PO PACK: 17.0000 g | PACK | Freq: Every day | ORAL | Status: DC | PRN

## 2021-05-22 MED ORDER — ENOXAPARIN SODIUM 40 MG/0.4ML IJ SOSY
40.0000 mg | PREFILLED_SYRINGE | INTRAMUSCULAR | Status: DC
  Administered 2021-05-22 – 2021-05-24 (×3): 40 mg via SUBCUTANEOUS
  Filled 2021-05-22 (×3): qty 0.4

## 2021-05-22 MED ORDER — IPRATROPIUM-ALBUTEROL 0.5-2.5 (3) MG/3ML IN SOLN
RESPIRATORY_TRACT | Status: AC
  Administered 2021-05-22: 3 mL via RESPIRATORY_TRACT
  Filled 2021-05-22: qty 3

## 2021-05-22 MED ORDER — IPRATROPIUM-ALBUTEROL 0.5-2.5 (3) MG/3ML IN SOLN
3.0000 mL | Freq: Three times a day (TID) | RESPIRATORY_TRACT | Status: DC
  Administered 2021-05-22 – 2021-05-23 (×2): 3 mL via RESPIRATORY_TRACT
  Filled 2021-05-22 (×2): qty 3

## 2021-05-22 MED ORDER — ACETAMINOPHEN 650 MG RE SUPP: 650.0000 mg | Freq: Four times a day (QID) | RECTAL | Status: DC | PRN

## 2021-05-22 MED ORDER — TADALAFIL (PAH) 20 MG PO TABS
40.0000 mg | ORAL_TABLET | Freq: Every day | ORAL | Status: DC
  Filled 2021-05-22: qty 2

## 2021-05-22 MED ORDER — OXYCODONE HCL 5 MG PO TABS: 5.0000 mg | ORAL_TABLET | ORAL | Status: DC | PRN

## 2021-05-22 MED ORDER — MORPHINE SULFATE (PF) 2 MG/ML IV SOLN
2.0000 mg | INTRAVENOUS | Status: DC | PRN
  Administered 2021-05-22 – 2021-05-23 (×2): 2 mg via INTRAVENOUS
  Filled 2021-05-22 (×2): qty 1

## 2021-05-22 MED ORDER — CLOPIDOGREL BISULFATE 75 MG PO TABS
75.0000 mg | ORAL_TABLET | Freq: Every day | ORAL | Status: DC
  Administered 2021-05-23 – 2021-05-25 (×3): 75 mg via ORAL
  Filled 2021-05-22 (×3): qty 1

## 2021-05-22 MED ORDER — DOCUSATE SODIUM 100 MG PO CAPS
100.0000 mg | ORAL_CAPSULE | Freq: Two times a day (BID) | ORAL | Status: DC
  Administered 2021-05-23 – 2021-05-25 (×5): 100 mg via ORAL
  Filled 2021-05-22 (×5): qty 1

## 2021-05-22 MED ORDER — SODIUM CHLORIDE 0.9% FLUSH
3.0000 mL | Freq: Two times a day (BID) | INTRAVENOUS | Status: DC
  Administered 2021-05-22 – 2021-05-25 (×5): 3 mL via INTRAVENOUS

## 2021-05-22 MED ORDER — ONDANSETRON HCL 4 MG/2ML IJ SOLN
4.0000 mg | Freq: Four times a day (QID) | INTRAMUSCULAR | Status: DC | PRN
Start: 2021-05-22 — End: 2021-05-25

## 2021-05-22 MED ORDER — METHIMAZOLE 5 MG PO TABS
2.5000 mg | ORAL_TABLET | Freq: Every day | ORAL | Status: DC
  Administered 2021-05-23 – 2021-05-25 (×3): 2.5 mg via ORAL
  Filled 2021-05-22 (×4): qty 1

## 2021-05-22 MED ORDER — CARVEDILOL 12.5 MG PO TABS
12.5000 mg | ORAL_TABLET | Freq: Two times a day (BID) | ORAL | Status: DC
  Administered 2021-05-23 – 2021-05-25 (×5): 12.5 mg via ORAL
  Filled 2021-05-22 (×5): qty 1

## 2021-05-22 MED ORDER — IPRATROPIUM-ALBUTEROL 0.5-2.5 (3) MG/3ML IN SOLN: 3.0000 mL | RESPIRATORY_TRACT | Status: DC | PRN

## 2021-05-22 MED ORDER — MACITENTAN 10 MG PO TABS: 10.0000 mg | ORAL_TABLET | Freq: Every day | ORAL | Status: DC

## 2021-05-22 MED ORDER — SILDENAFIL CITRATE 20 MG PO TABS
20.0000 mg | ORAL_TABLET | Freq: Three times a day (TID) | ORAL | Status: DC
Start: 2021-05-22 — End: 2021-05-25
  Administered 2021-05-23 – 2021-05-25 (×7): 20 mg via ORAL
  Filled 2021-05-22 (×7): qty 1

## 2021-05-22 MED ORDER — PANTOPRAZOLE SODIUM 40 MG PO TBEC
40.0000 mg | DELAYED_RELEASE_TABLET | Freq: Every morning | ORAL | Status: DC
  Administered 2021-05-23 – 2021-05-25 (×3): 40 mg via ORAL
  Filled 2021-05-22 (×3): qty 1

## 2021-05-22 MED ORDER — ACETAMINOPHEN 325 MG PO TABS: 650.0000 mg | ORAL_TABLET | Freq: Four times a day (QID) | ORAL | Status: DC | PRN

## 2021-05-22 MED ORDER — HYDRALAZINE HCL 20 MG/ML IJ SOLN: 5.0000 mg | INTRAMUSCULAR | Status: DC | PRN

## 2021-05-22 MED ORDER — FUROSEMIDE 10 MG/ML IJ SOLN
120.0000 mg | Freq: Once | INTRAVENOUS | Status: AC
  Administered 2021-05-22: 120 mg via INTRAVENOUS
  Filled 2021-05-22: qty 10

## 2021-05-22 NOTE — ED Triage Notes (Addendum)
Presents for sob and malaise that has become since being discharged from hospital for same 5/12. H/o CHF.  Poor air movement noted in lung bases, clear upper lobes. Increased work of breathing. Sats 95% on 6L simple mask.  Ambulatory and oriented x2 with EMS.   Language barrier, pt too hard of hearing to hear [place] interpreter. History limited by this

## 2021-05-22 NOTE — Consult Note (Signed)
NAME:  Mandy [name], MRN:  [id number], DOB:  1940-03-06, LOS: 0 ADMISSION DATE:  05/22/2021, CONSULTATION DATE:  05/22/2021 REFERRING MD:  Flossie Buffy, CHIEF COMPLAINT:  Pulmonary HTN , Volume overload , on BiPAP   History of Present Illness:  81 year old woman who has been followed in our office for severe pulmonary hypertension.  She is a never smoker, has history of systemic hypertension, CHF, CVA, diabetes, hypothyroidism, CKD stage III, obesity.  Pulmonary hypertension confirmed on right heart catheterization with a mean PASP 39 mmHg and PAOP 18, preserved cardiac output 6.1.  Course complicated by pleural effusions, right hydropneumothorax, thoracentesis while hospitalized in the past. . She was started on sildenafil in addition to Opsumit. Has had nausea with initiation of the Opsumit.  She was  changed to tadalafil when she saw Dr. Silas Flood in follow-up 04/28/2021 she was on 4 L/min oxygen.  Most recent hospitalization was 05/07/2021- 05/14/2021. Diuretic regimen furosemide 40 mg twice daily, increased due to progressive shortness of breath on 05/07/2021 to 80 mg  in the am and 40 mg in the pm.  daily.  Pt. developed worsening shortness of breath and malaise that has become progressive since discharge 05/14/2021. Chest x-ray suggestive of progressive pulmonary edema and . and effusions which appear similar to x-ray when she required hospitalization at the beginning of this month.    troponins are relatively flat at 86 and 96, CBC with unchanged hemoglobin at 9.  CMP with unchanged renal function with creatinine of 1.33 and stable electrolytes.  Patient has received 120 mg of IV Lasix in the ED. She is on BiPAP with sats of 96%. PCCM have been called to consult by Primary Team [facility] .  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 Left ventricular ejection fraction, by estimation, is 55 to 60%. The  left ventricle has normal function. The left ventricle has no regional  wall motion abnormalities. There is severe left ventricular hypertrophy.  Left ventricular diastolic parameters  are consistent with Grade II diastolic dysfunction (pseudonormalization).  Elevated left atrial pressure.   2. Right ventricular systolic function is moderately reduced. The right  ventricular size is mildly enlarged. There is severely elevated pulmonary  artery systolic pressure.   3. A small pericardial effusion is present. Moderate pleural effusion in  the right lateral region.   4. The mitral valve is normal in structure. Mild mitral valve  regurgitation. No evidence of mitral stenosis.   5. Tricuspid valve regurgitation is moderate.   6. The aortic valve is tricuspid. Aortic  valve regurgitation is not  visualized. No aortic stenosis is present.   7. The inferior vena cava is dilated in size with >50% respiratory  variability, suggesting right atrial pressure of 8 mmHg.     Interim History / Subjective:  Currently  on BiPAP, sats are good and RR is 20-23 Moderate Duiresis to 120 Lasix given in ED.   Objective   Blood  pressure (!) 188/61, pulse 83, temperature 98.5 F (36.9 C), temperature source Axillary, resp. rate 20, height '4\' 11"'$  (1.499 m), weight 72 kg, SpO2 95 %.    FiO2 (%):  [40 %] 40 %   Intake/Output Summary (Last 24 hours) at 05/22/2021 1226 Last data filed at 05/22/2021 1146 Gross per 24 hour  Intake 62 ml  Output 420 ml  Net -358 ml   Filed Weights   05/22/21 0545 05/22/21 0932  Weight: 72 kg 72 kg    Examination: General:  Awake and alert, on BiPAP, in NAD HENT:  NCAT, Trace JVDm No LAD, HOH Lungs:  Bilateral Chest excursion , BiPAP mechanical sounds throughout, few basilar crackles Cardiovascular:  S1, S2, RRR, No RMG, no Lower extremity edema Abdomen: Soft, NT, ND, BS +, Body mass index is 32.06 kg/m. Extremities: No obvious deformities, No edema, Skin is warm and dry, intact Neuro:  Awake and Alert, Antionette Poles, daughter here to interpret. MAE x 4 GU:  Pure Wick with dilute amber urine noted.  Resolved Hospital Problem list     Assessment & Plan:  Acute on chronic hypoxemic respiratory failure.   Acute Pulmonary Edema Enlarging moderate to large right and moderate left pleural effusions. Acute on Chronic Heart Failure>>  Decompensated Pulmonary Hypertension, multifactorial.  Tracheobronchomalacia Plan Aggressive diuresis as renal function allows  Trend BMET daily  Replete electrolytes as needed Resume  Sildenafil 20 mg TID>> No plans to restart Adcirca at this time  Discontinue Opsumit Continue BiPAP for now with goal to wean back to her baseline of 4 L as soon as able CXR in am and as needed to follow pleural effusions  Needs PFT's and in lab sleep study to confirm diagnosis of tracheobronchomalasia and CPAP therapy as OP.  Trend BNP Consider Palliation consult  Of note, Lasix 80 mg in am and 40 mg in PM was not adequate prior to this admission, consider 80 mg am and pm at discharge with close follow up blood work monitoring.     Best Practice (right click and  "Reselect all SmartList Selections" daily)   Diet/type:  Heart smart with 1500 cc fluid restriction  DVT prophylaxis:  Lovenox  GI prophylaxis:  Protonix Lines: PIV Foley:  NA Code Status:  Full Last date of multidisciplinary goals of care discussion ( Per Primary team)]  Critical Care APP Time : 45 minutes  Labs   CBC: Recent Labs  Lab 05/22/21 0733  WBC 5.4  HGB 9.4*  HCT 31.0*  MCV 81.4  PLT 462    Basic Metabolic Panel: Recent Labs  Lab 05/22/21 0558  NA 132*  K 4.6  CL 88*  CO2 36*  GLUCOSE 134*  BUN 26*  CREATININE 1.33*  CALCIUM 9.3   GFR: Estimated Creatinine Clearance: 29.1 mL/min (A) (by C-G formula based on SCr of 1.33 mg/dL (H)). Recent Labs  Lab 05/22/21 0733  WBC 5.4    Liver Function Tests: No results for input(s): AST, ALT, ALKPHOS, BILITOT, PROT, ALBUMIN in the last 168 hours. No results for input(s): LIPASE, AMYLASE in the last 168 hours.  No results for input(s): AMMONIA in the last 168 hours.  ABG    Component Value Date/Time   PHART 7.381 02/10/2021 0855   PCO2ART 76.0 (HH) 02/10/2021 0855   PO2ART 91.2 02/10/2021 0855   HCO3 44.0 (H) 02/10/2021 0855   TCO2 47 (H) 11/30/2020 1632   O2SAT 96.5 02/22/2021 0621     Coagulation Profile: No results for input(s): INR, PROTIME in the last 168 hours.  Cardiac Enzymes: No results for input(s): CKTOTAL, CKMB, CKMBINDEX, TROPONINI in the last 168 hours.  HbA1C: Hgb A1c MFr Bld  Date/Time Value Ref Range Status  12/07/2020 01:44 AM 5.7 (H) 4.8 - 5.6 % Final    Comment:    (NOTE) Pre diabetes:          5.7%-6.4%  Diabetes:              >6.4%  Glycemic control for   <7.0% adults with diabetes   11/03/2020 06:43 PM 5.9 (H) 4.8 - 5.6 % Final    Comment:    (NOTE) Pre diabetes:          5.7%-6.4%  Diabetes:              >6.4%  Glycemic control for   <7.0% adults with diabetes     CBG: No results for input(s): GLUCAP in the last 168 hours.  Review of Systems:   Malaise,  Fatigue, Dyspnea greater than her baseline  Past Medical History:  She,  has a past medical history of Aortic atherosclerosis ([place]) (10/08/2018), Arthritis, Chronic kidney disease, stage 3a ([place]), Chronic respiratory failure ([place]), Diabetes mellitus without complication ([place]), GERD (gastroesophageal reflux disease), Hepatic steatosis (10/08/2018), Hyperlipidemia, Hypertension, Hyperthyroidism, Mass of right ovary (10/08/2018), Mediastinal mass, Mild CAD, Osteoporosis, Pneumonia, Renal infarct ([place]), SBO (small bowel obstruction) ([place]) (08/09/2018), Severe pulmonary hypertension ([place]), and Stroke (cerebrum) ([place]).   Surgical History:   Past Surgical History:  Procedure Laterality Date   CATARACT EXTRACTION W/ INTRAOCULAR LENS  IMPLANT, BILATERAL     COLONOSCOPY     LEFT HEART CATH AND CORONARY ANGIOGRAPHY N/A 11/30/2020   Procedure: LEFT HEART CATH AND CORONARY ANGIOGRAPHY;  Surgeon: Early Osmond, MD;  Location: [facility];  Service: Cardiovascular;  Laterality: N/A;   RIGHT/LEFT HEART CATH AND CORONARY ANGIOGRAPHY N/A 11/30/2020   Procedure: RIGHT/LEFT HEART CATH AND CORONARY ANGIOGRAPHY;  Surgeon: Early Osmond, MD;  Location: [facility];  Service: Cardiovascular;  Laterality: N/A;     Social History:   reports that she has never smoked. She has never used smokeless tobacco. She reports that she does not drink alcohol and does not use drugs.   Family History:  Her family history includes Kidney disease in her son. There is no history of Colon cancer or Breast cancer.   Allergies No Known Allergies   Home Medications  Prior to Admission medications   Medication Sig Start Date End Date Taking? Authorizing Provider  acetaminophen (TYLENOL) 500 MG tablet Take 1,000 mg by mouth every 6 (six) hours as needed for fever or headache (pain).    [provider]  albuterol (PROVENTIL) (2.5 MG/3ML) 0.083% nebulizer solution Take 2.5 mg by nebulization every 6 (six)  hours as needed for wheezing or shortness of breath.    [provider]  albuterol (VENTOLIN HFA) 108 (90 Base) MCG/ACT inhaler Inhale 2 puffs into the lungs every 6 (six) hours as needed for wheezing or shortness of breath. 06/30/20   Thurnell Lose, MD  alendronate (  FOSAMAX) 70 MG tablet Take 70 mg by mouth once a week.    [provider]  atorvastatin (LIPITOR) 80 MG tablet Take 1 tablet (80 mg total) by mouth every evening. 02/22/21   Shawna Clamp, MD  calcium carbonate (OSCAL) 1500 (600 Ca) MG TABS tablet Take 600 mg of elemental calcium by mouth 2 (two) times daily with a meal.    [provider]  carvedilol (COREG) 12.5 MG tablet Take 1 tablet (12.5 mg total) by mouth 2 (two) times daily with a meal. 04/28/21   Donato Heinz, MD  cetirizine (ZYRTEC) 10 MG tablet Take 10 mg by mouth daily. 12/31/20   [provider]  clopidogrel (PLAVIX) 75 MG tablet Take 1 tablet (75 mg total) by mouth daily. 05/12/21   Bonnielee Haff, MD  diclofenac sodium (VOLTAREN) 1 % GEL Apply 4 g topically 4 (four) times daily as needed for pain. shoulder 09/13/18   [provider]  fluticasone (FLONASE) 50 MCG/ACT nasal spray Place 1 spray into both nostrils daily. Patient taking differently: Place 1 spray into both nostrils daily as needed for allergies. 02/23/21   Shawna Clamp, MD  gabapentin (NEURONTIN) 100 MG capsule Take 100 mg by mouth at bedtime. 07/18/18   [provider]  hydrALAZINE (APRESOLINE) 100 MG tablet Take 50 mg by mouth 3 (three) times daily. 03/28/21   [provider]  macitentan (OPSUMIT) 10 MG tablet Take 1 tablet (10 mg total) by mouth daily. 04/05/21   Hunsucker, Bonna Gains, MD  melatonin 5 MG TABS Take 10 mg by mouth at bedtime.    [provider]  methimazole (TAPAZOLE) 5 MG tablet Take 0.5 tablets (2.5 mg total) by mouth daily. 02/04/19   Hosie Poisson, MD  Multiple Vitamin (MULTIVITAMIN WITH MINERALS) TABS tablet  Take 1 tablet by mouth every morning.    [provider]  Omega-3 Fatty Acids (FISH OIL PO) Take 1 capsule by mouth every morning.    [provider]  OXYGEN Inhale 4 L into the lungs continuous.    [provider]  pantoprazole (PROTONIX) 40 MG tablet Take 1 tablet (40 mg total) by mouth daily at 6 (six) AM. Patient taking differently: Take 40 mg by mouth every morning. 06/30/20   Thurnell Lose, MD  potassium chloride SA (KLOR-CON M) 20 MEQ tablet Take 1 tablet (20 mEq total) by mouth daily. 12/08/20   Bonnielee Haff, MD  tadalafil, PAH, (ADCIRCA) 20 MG tablet Take 2 tablets (40 mg total) by mouth daily. Start tadalafil when received from mail order pharmacy. STOP SILDENAFIL when you start tadalafil. Do NOT take both at the same time. 05/12/21   Bonnielee Haff, MD  Torsemide 40 MG TABS Take 40 mg by mouth 2 (two) times daily. 05/12/21   Bonnielee Haff, MD  vitamin B-12 (CYANOCOBALAMIN) 1000 MCG tablet Take 1,000 mcg by mouth daily.    [provider]  vitamin C (ASCORBIC ACID) 500 MG tablet Take 500 mg by mouth daily.    [provider]  Vitamin D, Cholecalciferol, 25 MCG (1000 UT) TABS Take 1,000 Units by mouth daily.    [provider]  zinc gluconate 50 MG tablet Take 50 mg by mouth daily.    [provider]     Critical care time: 73 minutes     Magdalen Spatz, MSN, AGACNP-BC [place] for personal pager PCCM on call pager [phone]  05/22/2021 2:31 PM

## 2021-05-22 NOTE — H&P (Signed)
History and Physical    Patient: Mandy [name] IDP:[id number] DOB: 1940/12/11 DOA: 05/22/2021 DOS: the patient was seen and examined on 05/22/2021 PCP: [name], MD  Patient coming from: Home - lives with her daughter and grandson; NOK: Daughter, Burman Freestone, [phone]   Chief Complaint: SOB  HPI: Mandy [name] is a 81 y.o. female with medical history significant of CAD; stage 3a CKD; DM; HTN; HLD; and severe pulmonary HTN presenting with  SOB.  She was last hospitalized from 5/5-10 for acute on chronic respiratory failure associated with cor pulmonale and severe pulmonary HTN.  She was discharged home on 4L and was supposed to get CPAP or BIPAP at home but this did not happen.  She felt better and was discharged to home on Opsumit and sildenafil.  However, shortly after arriving home she began to feel SOB again and her weight started going up.  She saw cardiology on 5/12 and torsemide was increased to 80 mg BID.  However, despite increase in Lasix she has continued to feel increasingly SOB.  Last night, she went into respiratory distress.  She was brought to the ER and started on BIPAP with resultant improvement in symptoms.     ER Course:  Severe pulmonary HTN causing persistent CHF.  Admitted earlier this month for the same.  Weight down to 155 on 5/9, Torsemide increased to 80 on 5/12, weight 160.  Pulm HTN meds may change soon.  Last night was too distressed to breathe.  Placed on BIPAP on arrival.  CXR similar to prior.  ?discharged on CPAP.  Antionette Poles.  Given 120 mg Lasix.       Review of Systems: unable to review all systems due to the inability of the patient to answer questions. 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   Mild CAD    Osteoporosis    Pneumonia    Renal infarct (HCC)    SBO (small bowel obstruction) ([place]) 08/09/2018   Severe pulmonary hypertension ([place])    Stroke (cerebrum) (HCC)    Past Surgical History:  Procedure Laterality Date   CATARACT EXTRACTION W/ INTRAOCULAR LENS  IMPLANT, BILATERAL     COLONOSCOPY     LEFT HEART CATH AND CORONARY ANGIOGRAPHY N/A 11/30/2020   Procedure: LEFT HEART CATH AND CORONARY ANGIOGRAPHY;  Surgeon: Early Osmond, MD;  Location: [facility];  Service: Cardiovascular;  Laterality: N/A;   RIGHT/LEFT HEART CATH AND CORONARY ANGIOGRAPHY N/A 11/30/2020   Procedure: RIGHT/LEFT HEART CATH AND CORONARY ANGIOGRAPHY;  Surgeon: Early Osmond, MD;  Location: [facility];  Service: Cardiovascular;  Laterality: N/A;   Social History:  reports that she has never smoked. She has never used smokeless tobacco. She reports that she does not drink alcohol and does not use drugs.  No Known Allergies  Family History  Problem Relation Age of Onset   Kidney disease Son    Colon cancer Neg Hx    Breast cancer Neg Hx     Prior to Admission medications  Medication Sig Start Date End Date Taking? Authorizing Provider  acetaminophen (TYLENOL) 500 MG tablet Take 1,000 mg by mouth every 6 (six) hours as needed for fever or headache (pain).    [provider]  albuterol (PROVENTIL) (2.5 MG/3ML) 0.083% nebulizer solution Take 2.5 mg by nebulization every 6 (six) hours as needed for wheezing or shortness of breath.    [provider]  albuterol (VENTOLIN HFA) 108 (90 Base) MCG/ACT inhaler Inhale 2 puffs into the lungs every 6 (six) hours as needed for wheezing or shortness of breath. 06/30/20   Thurnell Lose, MD  alendronate (FOSAMAX) 70 MG tablet Take 70 mg by mouth  once a week.    [provider]  atorvastatin (LIPITOR) 80 MG tablet Take 1 tablet (80 mg total) by mouth every evening. 02/22/21   Shawna Clamp, MD  calcium carbonate (OSCAL) 1500 (600 Ca) MG TABS tablet Take 600 mg of elemental calcium by mouth 2 (two) times daily with a meal.    [provider]  carvedilol (COREG) 12.5 MG tablet Take 1 tablet (12.5 mg total) by mouth 2 (two) times daily with a meal. 04/28/21   Donato Heinz, MD  cetirizine (ZYRTEC) 10 MG tablet Take 10 mg by mouth daily. 12/31/20   [provider]  clopidogrel (PLAVIX) 75 MG tablet Take 1 tablet (75 mg total) by mouth daily. 05/12/21   Bonnielee Haff, MD  diclofenac sodium (VOLTAREN) 1 % GEL Apply 4 g topically 4 (four) times daily as needed for pain. shoulder 09/13/18   [provider]  fluticasone (FLONASE) 50 MCG/ACT nasal spray Place 1 spray into both nostrils daily. Patient taking differently: Place 1 spray into both nostrils daily as needed for allergies. 02/23/21   Shawna Clamp, MD  gabapentin (NEURONTIN) 100 MG capsule Take 100 mg by mouth at bedtime. 07/18/18   [provider]  hydrALAZINE (APRESOLINE) 100 MG tablet Take 50 mg by mouth 3 (three) times daily. 03/28/21   [provider]  macitentan (OPSUMIT) 10 MG tablet Take 1 tablet (10 mg total) by mouth daily. 04/05/21   Hunsucker, Bonna Gains, MD  melatonin 5 MG TABS Take 10 mg by mouth at bedtime.    [provider]  methimazole (TAPAZOLE) 5 MG tablet Take 0.5 tablets (2.5 mg total) by mouth daily. 02/04/19   Hosie Poisson, MD  Multiple Vitamin (MULTIVITAMIN WITH MINERALS) TABS tablet Take 1 tablet by mouth every morning.    [provider]  Omega-3 Fatty Acids (FISH OIL PO) Take 1 capsule by mouth every morning.    [provider]  OXYGEN Inhale 4 L into the lungs continuous.    [provider]  pantoprazole (PROTONIX) 40 MG tablet Take 1 tablet (40 mg total) by mouth daily  at 6 (six) AM. Patient taking differently: Take 40 mg by mouth every morning. 06/30/20   Thurnell Lose, MD  potassium chloride SA (KLOR-CON M) 20 MEQ tablet Take 1 tablet (20 mEq total) by mouth daily. 12/08/20   Bonnielee Haff, MD  sildenafil (REVATIO) 20 MG tablet Take 1 tablet (20 mg total) by mouth 3 (three) times daily. Continue sildenafil '20mg'$  by mouth three times daily until tadalafil is received from mail order pharmacy 05/12/21   Bonnielee Haff, MD  tadalafil, PAH, (ADCIRCA) 20 MG tablet Take 2 tablets (40 mg total) by mouth daily. Start tadalafil when received from mail order pharmacy. STOP SILDENAFIL when you start tadalafil. Do NOT take both at the same time. 05/12/21  Bonnielee Haff, MD  Torsemide 40 MG TABS Take 40 mg by mouth 2 (two) times daily. 05/12/21   Bonnielee Haff, MD  vitamin B-12 (CYANOCOBALAMIN) 1000 MCG tablet Take 1,000 mcg by mouth daily.    [provider]  vitamin C (ASCORBIC ACID) 500 MG tablet Take 500 mg by mouth daily.    [provider]  Vitamin D, Cholecalciferol, 25 MCG (1000 UT) TABS Take 1,000 Units by mouth daily.    [provider]  zinc gluconate 50 MG tablet Take 50 mg by mouth daily.    [provider]    Physical Exam: Vitals:   05/22/21 1142 05/22/21 1218 05/22/21 1221 05/22/21 1517  BP:  (!) 188/61  (!) 166/102  Pulse:  83 82 78  Resp:  20 (!) 21 20  Temp:  98.5 F (36.9 C)  98.7 F (37.1 C)  TempSrc:  Axillary  Oral  SpO2: 95% 95% 94% 100%  Weight:      Height:       General:  Appears calm and comfortable and is in NAD on BIPAP Eyes:  PERRL, EOMI, normal lids, iris ENT:  grossly normal hearing, BIPAP in place Neck:  no LAD, masses or thyromegaly Cardiovascular:  RRR, no m/r/g. No LE edema.  Respiratory:   CTA bilaterally with no wheezes/rales/rhonchi.  Normal to mildly increased respiratory effort on BIPAP. Abdomen:  soft, NT, ND Skin:  no rash or induration seen on limited exam Musculoskeletal:   grossly normal tone BUE/BLE, good ROM, no bony abnormality Psychiatric:  blunted mood and affect Neurologic:  unable to effectively perform   Radiological Exams on Admission: Independently reviewed - see discussion in A/P where applicable  DG Chest Portable 1 View  Result Date: 05/22/2021 CLINICAL DATA:  81 year old female with history of shortness of breath. EXAM: PORTABLE CHEST 1 VIEW COMPARISON:  Chest x-ray 05/07/2021. FINDINGS: Lung volumes are low. Extensive bibasilar opacities (right greater than left) which may reflect areas of atelectasis and/or consolidation, with superimposed enlarging moderate to large right and moderate left pleural effusions. Patchy areas of interstitial prominence are noted throughout the mid lungs bilaterally and in the right upper lobe. No pneumothorax. Pulmonary vasculature appears engorged. Heart size is moderately enlarged. Upper mediastinal contours are distorted by patient positioning. Atherosclerotic calcifications in the thoracic aorta. IMPRESSION: 1. The appearance of the chest remains most concerning for congestive heart failure, as above. 2. Aortic atherosclerosis. Electronically Signed   By: Vinnie Langton M.D.   On: 05/22/2021 07:32    EKG: Independently reviewed.  NSR with rate 98; nonspecific ST changes with no evidence of acute ischemia   Labs on Admission: I have personally reviewed the available labs and imaging studies at the time of the admission.  Pertinent labs:   Na++ 132 CO2 36 Glucose 134 BUN 26/Creatinine 1.33/GFR 40 - stable BNP 176.8 HS troponin 86, 96 WBC 5.4 Hgb 9.4    Assessment and Plan: Principal Problem:   Acute on chronic diastolic CHF (congestive heart failure) (HCC) Active Problems:   Acute on chronic respiratory failure with hypoxia and hypercapnia (HCC)   Cor pulmonale (HCC)   Severe pulmonary hypertension (HCC)   Essential hypertension   Dyslipidemia   Hyperthyroidism    Acute on chronic respiratory  failure with hypoxia due to decompensated cor pulmonale, pulmonary hypertension, B pleural effusions, and chronic diastolic CHF -Patient with known severe pulmonary HTN (02/2021 echo) and recent hospitalization with similar presentation -Despite compliance with medications, she has had progressive SOB and  weight gain since being home -Torsemide was increased to 80 mg BID without improvement -At baseline on home oxygen 4 L/min.   -She was started on BIPAP in the ER and is now comfortable -Per Dr. Silas Flood, it is possible that the Opsumit flooded a stiff left heart with diastolic dysfunction and created edema and effusions; he recommends stopping this medication -She was recently changed from sildenafil to tadalafil and does not think this is contributing, but is still going to "reset" back to sildenafil -She also needs aggressive diuresis -Will continue BIPAP as needed -Cardiology is also consulting -Additionally, given recurrent hospitalizations in a short period of time in conjunction with an irreversible problem, will request palliative care consultation  Stage 3a chronic kidney disease ([place]) -Appears to be at/near baseline -Will need close ongoing monitoring given the need for aggressive diuresis   Essential hypertension -Continue Coreg, hydralazine   Dyslipidemia -Continue Lipitor   Hyperthyroidism -Continue Tapazole 2.5 Mg daily. -No clinical hyperthyroidism.   History of stroke -Continue Plavix and statin    Total critical care time: 55 minutes Critical care time was exclusive of separately billable procedures and treating other patients. Critical care was necessary to treat or prevent imminent or life-threatening deterioration. Critical care was time spent personally by me on the following activities: development of treatment plan with patient and/or surrogate as well as nursing, discussions with consultants, evaluation of patient's response to treatment, examination of  patient, obtaining history from patient or surrogate, ordering and performing treatments and interventions, ordering and review of laboratory studies, ordering and review of radiographic studies, pulse oximetry and re-evaluation of patient's condition.    Advance Care Planning:   Code Status: Full Code   Consults: Pulmonology; Cardiology; Palliative care; CHF navigator; TOC team; nutrition; PT/OT  DVT Prophylaxis: Lovenox  Family Communication: Daughter was present throughout evaluation  Severity of Illness: The appropriate patient status for this patient is INPATIENT. Inpatient status is judged to be reasonable and necessary in order to provide the required intensity of service to ensure the patient's safety. The patient's presenting symptoms, physical exam findings, and initial radiographic and laboratory data in the context of their chronic comorbidities is felt to place them at high risk for further clinical deterioration. Furthermore, it is not anticipated that the patient will be medically stable for discharge from the hospital within 2 midnights of admission.   * I certify that at the point of admission it is my clinical judgment that the patient will require inpatient hospital care spanning beyond 2 midnights from the point of admission due to high intensity of service, high risk for further deterioration and high frequency of surveillance required.*  Author: Karmen Bongo, MD 05/22/2021 5:04 PM  For on call review [email or web address].

## 2021-05-22 NOTE — ED Notes (Signed)
RT contacted based on patient's work of breathing. Placed on bipap.

## 2021-05-22 NOTE — ED Notes (Signed)
ED TO INPATIENT HANDOFF REPORT  ED Nurse Name and Phone #: Lynleigh Kovack [phone]  S Name/Age/Gender Mandy [name] 81 y.o. female Room/Bed: 033C/033C  Code Status   Code Status: Prior  Home/SNF/Other Home Patient oriented to: self Is this baseline? Yes   Triage Complete: Triage complete  Chief Complaint Acute on chronic diastolic CHF (congestive heart failure) ([place]) [I50.33]  Triage Note Presents for sob and malaise that has become since being discharged from hospital for same 5/12. H/o CHF.  Poor air movement noted in lung bases, clear upper lobes. Increased work of breathing. Sats 95% on 6L simple mask.  Ambulatory and oriented x2 with EMS.   Language barrier, pt too hard of hearing to hear [place] interpreter. History limited by this   Allergies No Known Allergies  Level of Care/Admitting Diagnosis ED Disposition     ED Disposition  Admit   Condition  --   [place]: [place] [100100]  Level of Care: Progressive [102]  Admit to Progressive based on following criteria: RESPIRATORY PROBLEMS hypoxemic/hypercapnic respiratory failure that is responsive to NIPPV (BiPAP) or High Flow Nasal Cannula (6-80 lpm). Frequent assessment/intervention, no > Q2 hrs < Q4 hrs, to maintain oxygenation and pulmonary hygiene.  May admit patient to [facility] or [facility] if equivalent level of care is available:: No  Covid Evaluation: Asymptomatic - no recent exposure (last 10 days) testing not required  Diagnosis: Acute on chronic diastolic CHF (congestive heart failure) [facility]) [[id number]]  Admitting Physician: Karmen Bongo [[id number]]  Attending Physician: Karmen Bongo [[id number]]  Estimated length of stay: 3 - 4 days  Certification:: I certify this patient will need inpatient services for at least 2 midnights          B Medical/Surgery History Past Medical History:  Diagnosis Date   Aortic atherosclerosis ([place]) 10/08/2018   Arthritis    Chronic  kidney disease, stage 3a ([place])    Chronic respiratory failure ([place])    Diabetes mellitus without complication ([place])    GERD (gastroesophageal reflux disease)    Hepatic steatosis 10/08/2018   Hyperlipidemia    Hypertension    Hyperthyroidism    Mass of right ovary 10/08/2018   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/w benign thyroid tissue   Mild CAD    Osteoporosis    Pneumonia    Renal infarct ([place])    SBO (small bowel obstruction) ([place]) 08/09/2018   Severe pulmonary hypertension ([place])    Stroke (cerebrum) ([place])    Past Surgical History:  Procedure Laterality Date   CATARACT EXTRACTION W/ INTRAOCULAR LENS  IMPLANT, BILATERAL     COLONOSCOPY     LEFT HEART CATH AND CORONARY ANGIOGRAPHY N/A 11/30/2020   Procedure: LEFT HEART CATH AND CORONARY ANGIOGRAPHY;  Surgeon: Early Osmond, MD;  Location: [facility];  Service: Cardiovascular;  Laterality: N/A;   RIGHT/LEFT HEART CATH AND CORONARY ANGIOGRAPHY N/A 11/30/2020   Procedure: RIGHT/LEFT HEART CATH AND CORONARY ANGIOGRAPHY;  Surgeon: Early Osmond, MD;  Location: [facility];  Service: Cardiovascular;  Laterality: N/A;     A IV Location/Drains/Wounds Patient Lines/Drains/Airways Status     Active Line/Drains/Airways     Name Placement date Placement time Site Days   Peripheral IV 05/22/21 20 G Anterior;Left Hand 05/22/21  0733  Hand  less than 1            Intake/Output Last 24 hours  Intake/Output Summary (Last 24 hours) at 05/22/2021 1049 Last data  filed at 05/22/2021 0931 Gross per 24 hour  Intake 62 ml  Output --  Net 62 ml    Labs/Imaging Results for orders placed or performed during the hospital encounter of 05/22/21 (from the past 48 hour(s))  Basic metabolic panel     Status: Abnormal   Collection Time: 05/22/21  5:58 AM  Result Value Ref Range   Sodium 132 (L) 135 - 145 mmol/L   Potassium 4.6 3.5 - 5.1 mmol/L   Chloride 88 (L) 98 - 111 mmol/L   CO2 36 (H) 22 - 32 mmol/L    Glucose, Bld 134 (H) 70 - 99 mg/dL    Comment: Glucose reference range applies only to samples taken after fasting for at least 8 hours.   BUN 26 (H) 8 - 23 mg/dL   Creatinine, Ser 1.33 (H) 0.44 - 1.00 mg/dL   Calcium 9.3 8.9 - 10.3 mg/dL   GFR, Estimated 40 (L) >60 mL/min    Comment: (NOTE) Calculated using the CKD-EPI Creatinine Equation (2021)    Anion gap 8 5 - 15    Comment: Performed at [facility] [address] [place], [place] [address]  Troponin I (High Sensitivity)     Status: Abnormal   Collection Time: 05/22/21  5:58 AM  Result Value Ref Range   Troponin I (High Sensitivity) 86 (H) <18 ng/L    Comment: (NOTE) Elevated high sensitivity troponin I (hsTnI) values and significant  changes across serial measurements may suggest ACS but many other  chronic and acute conditions are known to elevate hsTnI results.  Refer to the "Links" section for chest pain algorithms and additional  guidance. Performed at [facility], [place] [address] [place], [place] [address]   Troponin I (High Sensitivity)     Status: Abnormal   Collection Time: 05/22/21  7:33 AM  Result Value Ref Range   Troponin I (High Sensitivity) 96 (H) <18 ng/L    Comment: (NOTE) Elevated high sensitivity troponin I (hsTnI) values and significant  changes across serial measurements may suggest ACS but many other  chronic and acute conditions are known to elevate hsTnI results.  Refer to the "Links" section for chest pain algorithms and additional  guidance. Performed at [facility], [place] [address], [place], [place] [address]   CBC     Status: Abnormal   Collection Time: 05/22/21  7:33 AM  Result Value Ref Range   WBC 5.4 4.0 - 10.5 K/uL   RBC 3.81 (L) 3.87 - 5.11 MIL/uL   Hemoglobin 9.4 (L) 12.0 - 15.0 g/dL   HCT 31.0 (L) 36.0 - 46.0 %   MCV 81.4 80.0 - 100.0 fL   MCH 24.7 (L) 26.0 - 34.0 pg   MCHC 30.3 30.0 - 36.0 g/dL   RDW 18.2 (H) 11.5 - 15.5 %   Platelets 263 150 - 400 K/uL    nRBC 0.0 0.0 - 0.2 %    Comment: Performed at [facility], [place] [address] [place], [place] [address]  Brain natriuretic peptide     Status: Abnormal   Collection Time: 05/22/21  7:33 AM  Result Value Ref Range   B Natriuretic Peptide 176.8 (H) 0.0 - 100.0 pg/mL    Comment: Performed at [facility] [address], [place], [place] [address]   DG Chest Portable 1 View  Result Date: 05/22/2021 CLINICAL DATA:  81 year old female with history of shortness of breath. EXAM: PORTABLE CHEST 1 VIEW COMPARISON:  Chest x-ray 05/07/2021. FINDINGS: Lung volumes are low. Extensive bibasilar opacities (right greater than left) which may reflect areas of atelectasis and/or consolidation, with superimposed enlarging moderate to large right and moderate left pleural effusions. Patchy areas of interstitial prominence are noted throughout the mid lungs bilaterally and in the right upper lobe. No pneumothorax. Pulmonary vasculature appears engorged. Heart size is moderately enlarged. Upper mediastinal contours are distorted by patient positioning. Atherosclerotic calcifications in the thoracic aorta. IMPRESSION: 1. The appearance of the chest remains most concerning for congestive heart failure, as above. 2. Aortic atherosclerosis. Electronically Signed   By: Vinnie Langton M.D.   On: 05/22/2021 07:32    Pending Labs Unresulted Labs (From admission, onward)     Start     Ordered   05/22/21 0643  Blood gas, venous  Once,   R        05/22/21 0642            Vitals/Pain Today's Vitals   05/22/21 0915 05/22/21 0932 05/22/21 1015 05/22/21 1030  BP: (!) 146/56  (!) 154/83 108/82  Pulse: 77  90 88  Resp: (!) 23  (!) 23 (!) 28  Temp:      SpO2: 94%  96% 96%  Weight:  72 kg    Height:  '4\' 11"'$  (1.499 m)    PainSc:        Isolation Precautions No active isolations  Medications Medications  ipratropium-albuterol (DUONEB) 0.5-2.5 (3) MG/3ML nebulizer solution 3 mL (3 mLs Nebulization Given  05/22/21 0837)  ipratropium-albuterol (DUONEB) 0.5-2.5 (3) MG/3ML nebulizer solution 3 mL (3 mLs Nebulization Given 05/22/21 9485)  furosemide (LASIX) 120 mg in dextrose 5 % 50 mL IVPB (0 mg Intravenous Stopped 05/22/21 0931)    Mobility  High fall risk   Focused Assessments     R Recommendations: See Admitting Provider Note  Report given to:   Additional Notes: Currently on Bipap and uses Purewick.

## 2021-05-22 NOTE — ED Provider Notes (Signed)
I assumed care of the patient at 7 AM from Dr. Dayna Barker.  Patient with significant comorbidities including severe pulmonary hypertension who is presenting with worsening shortness of breath today requiring BiPAP.  Patient is still taking 80 mg of torsemide per day which was increased since her last cardiology visit 6 days ago and still taking her medication for pulmonary hypertension.  Patient's chest x-ray which I independently visualized and interpreted shows evidence of pulmonary edema and effusions which appear similar to x-ray when she required hospitalization at the beginning of this month.  I independently interpreted patient's labs and troponins are relatively flat at 86 and 96, CBC with unchanged hemoglobin at 9.  CMP with unchanged renal function with creatinine of 1.33 and stable electrolytes.  Patient has received 120 mg of IV Lasix.  Her daughter provides the history as there is a language barrier as patient speaks [employer].  Patient is currently comfortable on BiPAP.  Sats are 96% and will admit for further care.   Blanchie Dessert, MD 05/22/21 [phone]

## 2021-05-22 NOTE — Consult Note (Signed)
Cardiology Consultation:   Patient ID: Mandy [name] MRN: [id number]; DOB: 1940-01-22  Admit date: 05/22/2021 Date of Consult: 05/22/2021  PCP:  [name], MD   [facility] Providers Cardiologist:  Donato Heinz, MD        Patient Profile:   Mandy [name] is a 81 y.o. 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  She has had multiple recent admissions due to acute on chronic respiratory failure.  She was admitted from January 31 through February 22, 2021 with respiratory failure, severe pulmonary hypertension, acute on chronic diastolic heart failure and RV failure.  She required milrinone during the admission and was started on sildenafil.  She was admitted again from 5/5 through 05/12/2021 with acute on chronic respiratory failure.  She had started Opsumit about 2 weeks prior.  She was treated for pneumonia and diuresed.  She presents today with acute on chronic respiratory failure.  Initial vital signs notable for BP 188/79, pulse 97.  In respiratory distress, required Bipap on arrival.  Labs notable for creatinine 1.33, bicarb 36, sodium 132, BNP 177, troponin 86 > 96, hemoglobin 9.4, platelets 263.  Chest x-ray shows CHF.   EKG shows sinus rhythm, rate 98, no ST abnormalities.  Reports shortness of breath, denies any chest pain.   Past Medical History:  Diagnosis Date   Aortic atherosclerosis ([place]) 10/08/2018   Arthritis    Chronic kidney disease, stage 3a ([place])    Chronic respiratory failure (HCC)    Diabetes mellitus without complication (HCC)    GERD (gastroesophageal reflux disease)    Hepatic steatosis 10/08/2018   Hyperlipidemia    Hypertension    Hyperthyroidism    Mass of right ovary 10/08/2018   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/w benign thyroid tissue   Mild CAD    Osteoporosis    Pneumonia    Renal infarct (HCC)    SBO (small bowel obstruction) ([place]) 08/09/2018   Severe pulmonary hypertension ([place])    Stroke (cerebrum) (HCC)     Past Surgical History:  Procedure Laterality Date   CATARACT EXTRACTION W/ INTRAOCULAR LENS  IMPLANT, BILATERAL     COLONOSCOPY     LEFT HEART CATH AND CORONARY ANGIOGRAPHY N/A 11/30/2020   Procedure: LEFT HEART CATH AND CORONARY ANGIOGRAPHY;  Surgeon: Early Osmond, MD;  Location: [place] [facility];  Service: Cardiovascular;  Laterality: N/A;   RIGHT/LEFT HEART CATH AND CORONARY ANGIOGRAPHY N/A 11/30/2020   Procedure: RIGHT/LEFT HEART CATH AND CORONARY ANGIOGRAPHY;  Surgeon: Early Osmond, MD;  Location: [place] [facility];  Service: Cardiovascular;  Laterality: N/A;      Inpatient Medications: Scheduled Meds:  atorvastatin  80 mg Oral QPM   carvedilol  12.5 mg Oral BID WC   clopidogrel  75 mg Oral Daily   docusate sodium  100 mg Oral BID   enoxaparin (  LOVENOX) injection  40 mg Subcutaneous Q24H   furosemide  80 mg Intravenous BID   gabapentin  100 mg Oral QHS   hydrALAZINE  50 mg Oral TID   ipratropium-albuterol  3 mL Nebulization TID   loratadine  10 mg Oral Daily   melatonin  10 mg Oral QHS   methimazole  2.5 mg Oral Daily   pantoprazole  40 mg Oral q morning   sildenafil  20 mg Oral TID   sodium chloride flush  3 mL Intravenous  Q12H   Continuous Infusions:  PRN Meds: acetaminophen **OR** acetaminophen, bisacodyl, guaiFENesin, hydrALAZINE, morphine injection, ondansetron **OR** ondansetron (ZOFRAN) IV, oxyCODONE, polyethylene glycol, traZODone  Allergies:   No Known Allergies  Social History:   Social History   Socioeconomic History   Marital status: Widowed    Spouse name: Not on file   Number of children: Not on file   Years of education: Not on file   Highest education level: Not on file  Occupational History   Not on file  Tobacco Use   Smoking status: Never   Smokeless tobacco: Never  Vaping Use   Vaping Use: Never used  Substance and Sexual Activity   Alcohol use: No   Drug use: No   Sexual activity: Not on file  Other Topics Concern   Not on file  Social History Narrative   Patient is from [place]   Speaks Krio   Social Determinants of Health   Financial Resource Strain: Not on file  Food Insecurity: Not on file  Transportation Needs: Not on file  Physical Activity: Not on file  Stress: Not on file  Social Connections: Not on file  Intimate Partner Violence: Not on file    Family History:    Family History  Problem Relation Age of Onset   Kidney disease Son    Colon cancer Neg Hx    Breast cancer Neg Hx      ROS:  Please see the history of present illness.   All other ROS reviewed and negative.     Physical Exam/Data:   Vitals:   05/22/21 1142 05/22/21 1218 05/22/21 1221 05/22/21 1517  BP:  (!) 188/61  (!) 166/102  Pulse:  83 82 78  Resp:  20 (!) 21 20  Temp:  98.5 F (36.9 C)  98.7 F (37.1 C)  TempSrc:  Axillary  Oral  SpO2: 95% 95% 94% 100%  Weight:      Height:        Intake/Output Summary (Last 24 hours) at 05/22/2021 1850 Last data filed at 05/22/2021 1752 Gross per 24 hour  Intake 62 ml  Output 1120 ml  Net -1058 ml      05/22/2021    9:32 AM 05/22/2021    5:45 AM 05/14/2021    1:43 PM  Last 3 Weights  Weight (lbs) 158 lb 11.7 oz 158 lb  11.7 oz 160 lb  Weight (kg) 72 kg 72 kg 72.576 kg     Body mass index is 32.06 kg/m.  General: On BiPAP HEENT: normal Neck: + JVD Cardiac:  RRR; no murmur  Lungs: Diffuse rhonchi, bibasilar crackles Abd: soft, nontender Ext: no edema Musculoskeletal:  No deformities Skin: warm and dry  Neuro:  no focal abnormalities noted Psych: Unable to assess  EKG:  The EKG was personally reviewed and demonstrates:   sinus rhythm, rate 98, no ST abnormalities. Telemetry:  Telemetry was personally reviewed and demonstrates: Normal sinus rhythm  Relevant CV  Studies:   Laboratory Data:  High Sensitivity Troponin:   Recent Labs  Lab 05/07/21 1649 05/07/21 1852 05/22/21 0558 05/22/21 0733  TROPONINIHS 86* 85* 86* 96*     Chemistry Recent Labs  Lab 05/22/21 0558  NA 132*  K 4.6  CL 88*  CO2 36*  GLUCOSE 134*  BUN 26*  CREATININE 1.33*  CALCIUM 9.3  GFRNONAA 40*  ANIONGAP 8    No results for input(s): PROT, ALBUMIN, AST, ALT, ALKPHOS, BILITOT in the last 168 hours. Lipids No results for input(s): CHOL, TRIG, HDL, LABVLDL, LDLCALC, CHOLHDL in the last 168 hours.  Hematology Recent Labs  Lab 05/22/21 0733  WBC 5.4  RBC 3.81*  HGB 9.4*  HCT 31.0*  MCV 81.4  MCH 24.7*  MCHC 30.3  RDW 18.2*  PLT 263   Thyroid No results for input(s): TSH, FREET4 in the last 168 hours.  BNP Recent Labs  Lab 05/22/21 0733  BNP 176.8*    DDimer No results for input(s): DDIMER in the last 168 hours.   Radiology/Studies:  DG Chest Portable 1 View  Result Date: 05/22/2021 CLINICAL DATA:  81 year old female with history of shortness of breath. EXAM: PORTABLE CHEST 1 VIEW COMPARISON:  Chest x-ray 05/07/2021. FINDINGS: Lung volumes are low. Extensive bibasilar opacities (right greater than left) which may reflect areas of atelectasis and/or consolidation, with superimposed enlarging moderate to large right and moderate left pleural effusions. Patchy areas of interstitial prominence are  noted throughout the mid lungs bilaterally and in the right upper lobe. No pneumothorax. Pulmonary vasculature appears engorged. Heart size is moderately enlarged. Upper mediastinal contours are distorted by patient positioning. Atherosclerotic calcifications in the thoracic aorta. IMPRESSION: 1. The appearance of the chest remains most concerning for congestive heart failure, as above. 2. Aortic atherosclerosis. Electronically Signed   By: Vinnie Langton M.D.   On: 05/22/2021 07:32     Assessment and Plan:    Acute on chronic diastolic heart failure/right heart failure: Most recent echo 05/08/2021 showed EF 55 to 82%, grade 2 diastolic dysfunction, moderately reduced RV function, severely elevated pulmonary pressures, small pericardial effusion, moderate TR.  She appears volume overloaded. -Continue IV Lasix 80 mg twice daily -Pulmonology following, suspect Opsumit contributing to diastolic heart failure.  Discontinued Opsumit and tadalafil and restarted on sildenafil 20 mg 3 times daily  Pulmonary hypertension: LHC/RHC 11/30/2020 showed nonobstructive CAD (30% mid LAD, 30% distal LAD), moderate pulm hypertension (RA 2, RV 76/0, PA 72/16/38, PW 18, LVEDP 9, CI 3.6).  Follows with Dr. Silas Flood.  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  On tadalafil and Opsumit.   -Discontinuing Opsumit and tadalafil and restarting sildenafil per Dr. Silas Flood  Acute on chronic hypoxic respiratory failure: Multifactorial with diastolic heart failure and severe pulmonary hypertension contributing.  Started on BiPAP, pulmonology following.  Continue IV diuresis   For questions or updates, please contact [place] Please consult [email or web address] for contact info under    Signed, Donato Heinz, MD  05/22/2021 6:50 PM

## 2021-05-22 NOTE — ED Notes (Signed)
Called pt daughter to clarify C/C. States that she has been taking torsemide '80mg'$  in AM, '40mg'$  in PM. Wears home O2 , was wearing 2L [place] but recently increased to 4L. Endorses loss of appetite, cough (nonproductive).

## 2021-05-22 NOTE — Consult Note (Signed)
Consultation Note Date: 05/22/2021   Patient Name: Mandy [name]  DOB: [birth year]  MRN: [id number]  Age / Sex: 81 y.o., female  PCP: [name], MD Referring Physician: Karmen Bongo, MD  Reason for Consultation: Establishing goals of care  HPI/Patient Profile: 81 y.o. female with past history of chronic diastolic congestive heart failure, diabetes, anterior mediastinal mass status post resection September 2020, thyroid disease, recurrent pleural effusions, severe pulmonary hypertension, chronic kidney disease, nonobstructive coronary disease, cryptogenic stroke admitted on 05/22/2021 with acute on chronic hypoxemic respiratory failure secondary to pulmonary hypertension with volume overload.  Clinical Assessment and Goals of Care: I met today with Mandy [name] and her daughter, Mandy [name].   She is currently on BiPAP and we discussed clinical course as well as wishes moving forward in regard to advanced directives.  Values and goals of care important to patient and family were attempted to be elicited.  Reviewed current care plan and discussed the fact that she has nonfixable issue and we continue to try to manage as best possible but she is having recurrent hospitalizations and continued progression of her chronic underlying conditions.  She and her daughter share a great deal of faith and are hopeful that God's intervention will heal her.  They remain steadfast in this hope and continue to endorse desire for any and all aggressive interventions moving forward.  This is very consistent with every other conversation we have had from palliative care team with her and her daughter.   Questions and concerns addressed.   PMT will continue to support holistically.   NEXT OF KIN Daughter Mandy [name] who is present at the bedside, patient is originally from [place]  SUMMARY OF RECOMMENDATIONS   -Full  code/full scope: Patient and her daughter remain invested for continuation of any of all aggressive interventions.  Our team has had multiple discussions with her and she and her daughter are hopeful for God's intervention and healing for her.   -Palliative care will continue to follow peripherally.  Code Status/Advance Care Planning: Full code   Symptom Management:     Palliative Prophylaxis:  Delirium Protocol  Additional Recommendations (Limitations, Scope, Preferences): Full Scope Treatment  Psycho-social/Spiritual:  Desire for further Chaplaincy support:yes Additional Recommendations: Caregiving  Support/Resources  Prognosis:  Unable to determine  Discharge Planning: To Be Determined      Primary Diagnoses: Present on Admission:  Acute on chronic diastolic CHF (congestive heart failure) (HCC)  Severe pulmonary hypertension (HCC)  Hyperthyroidism  Essential hypertension  Dyslipidemia  Cor pulmonale (HCC)  Acute on chronic respiratory failure with hypoxia and hypercapnia ([place])   I have reviewed the medical record, interviewed the patient and family, and examined the patient. The following aspects are pertinent.  Past Medical History:  Diagnosis Date   Aortic atherosclerosis ([place]) 10/08/2018   Arthritis    Chronic kidney disease, stage 3a ([place])    Chronic respiratory failure (HCC)    Diabetes mellitus without complication (HCC)    GERD (gastroesophageal reflux disease)    Hepatic steatosis 10/08/2018  Hyperlipidemia    Hypertension    Hyperthyroidism    Mass of right ovary 10/08/2018   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/w benign thyroid tissue   Mild CAD    Osteoporosis    Pneumonia    Renal infarct (HCC)    SBO (small bowel obstruction) ([place]) 08/09/2018   Severe pulmonary hypertension (HCC)    Stroke (cerebrum) (HCC)    Social History   Socioeconomic History   Marital status: Widowed    Spouse name: Not on file   Number of children: Not on  file   Years of education: Not on file   Highest education level: Not on file  Occupational History   Not on file  Tobacco Use   Smoking status: Never   Smokeless tobacco: Never  Vaping Use   Vaping Use: Never used  Substance and Sexual Activity   Alcohol use: No   Drug use: No   Sexual activity: Not on file  Other Topics Concern   Not on file  Social History Narrative   Patient is from [place]   Speaks Krio   Social Determinants of Health   Financial Resource Strain: Not on file  Food Insecurity: Not on file  Transportation Needs: Not on file  Physical Activity: Not on file  Stress: Not on file  Social Connections: Not on file   Family History  Problem Relation Age of Onset   Kidney disease Son    Colon cancer Neg Hx    Breast cancer Neg Hx    Scheduled Meds:  atorvastatin  80 mg Oral QPM   carvedilol  12.5 mg Oral BID WC   clopidogrel  75 mg Oral Daily   docusate sodium  100 mg Oral BID   enoxaparin (LOVENOX) injection  40 mg Subcutaneous Q24H   furosemide  80 mg Intravenous BID   gabapentin  100 mg Oral QHS   hydrALAZINE  50 mg Oral TID   ipratropium-albuterol  3 mL Nebulization TID   loratadine  10 mg Oral Daily   melatonin  10 mg Oral QHS   methimazole  2.5 mg Oral Daily   pantoprazole  40 mg Oral q morning   sildenafil  20 mg Oral TID   sodium chloride flush  3 mL Intravenous Q12H   Continuous Infusions:   PRN Meds:.acetaminophen **OR** acetaminophen, bisacodyl, guaiFENesin, hydrALAZINE, morphine injection, ondansetron **OR** ondansetron (ZOFRAN) IV, oxyCODONE, polyethylene glycol, traZODone Medications Prior to Admission:  Prior to Admission medications   Medication Sig Start Date End Date Taking? Authorizing Provider  acetaminophen (TYLENOL) 500 MG tablet Take 1,000 mg by mouth every 6 (six) hours as needed for fever or headache (pain).   Yes [provider]  albuterol (PROVENTIL) (2.5 MG/3ML) 0.083% nebulizer solution Take 2.5 mg by  nebulization every 6 (six) hours as needed for wheezing or shortness of breath.   Yes [provider]  albuterol (VENTOLIN HFA) 108 (90 Base) MCG/ACT inhaler Inhale 2 puffs into the lungs every 6 (six) hours as needed for wheezing or shortness of breath. 06/30/20  Yes Thurnell Lose, MD  alendronate (FOSAMAX) 70 MG tablet Take 70 mg by mouth every Tuesday. Take with a full glass of water on an empty stomach.   Yes [provider]  atenolol (TENORMIN) 50 MG tablet Take 50 mg by mouth 2 (two) times daily.   Yes [provider]  calcium carbonate (OSCAL) 1500 (600 Ca) MG TABS tablet Take 600 mg of elemental calcium  by mouth 2 (two) times daily with a meal.   Yes [provider]  cetirizine (ZYRTEC) 10 MG tablet Take 10 mg by mouth daily. 12/31/20  Yes [provider]  clopidogrel (PLAVIX) 75 MG tablet Take 1 tablet (75 mg total) by mouth daily. 12/08/20  Yes Bonnielee Haff, MD  diclofenac sodium (VOLTAREN) 1 % GEL Apply 4 g topically 4 (four) times daily as needed for pain. shoulder 09/13/18  Yes [provider]  gabapentin (NEURONTIN) 100 MG capsule Take 100 mg by mouth at bedtime. 07/18/18  Yes [provider]  hydrALAZINE (APRESOLINE) 100 MG tablet Take 0.5 tablets (50 mg total) by mouth 3 (three) times daily. 10/09/20  Yes Nicole Kindred A, DO  melatonin 5 MG TABS Take 10 mg by mouth at bedtime.   Yes [provider]  methimazole (TAPAZOLE) 5 MG tablet Take 0.5 tablets (2.5 mg total) by mouth daily. 02/04/19  Yes Hosie Poisson, MD  Multiple Vitamin (MULTIVITAMIN WITH MINERALS) TABS tablet Take 1 tablet by mouth every morning.   Yes [provider]  Omega-3 Fatty Acids (FISH OIL PO) Take 1 capsule by mouth every morning.   Yes [provider]  OXYGEN Inhale 4 L into the lungs continuous.   Yes [provider]  pantoprazole (PROTONIX) 40 MG tablet Take 1 tablet (40 mg total) by mouth daily at 6 (six)  AM. Patient taking differently: Take 40 mg by mouth every morning. 06/30/20  Yes Thurnell Lose, MD  potassium chloride SA (KLOR-CON M) 20 MEQ tablet Take 1 tablet (20 mEq total) by mouth daily. 12/08/20  Yes Bonnielee Haff, MD  simvastatin (ZOCOR) 40 MG tablet Take 1 tablet (40 mg total) by mouth at bedtime. 12/08/20  Yes Bonnielee Haff, MD  torsemide 40 MG TABS Take 40 mg by mouth in the morning and at bedtime. 01/25/21 02/24/21 Yes Baldwin [place], PA-C  vitamin B-12 (CYANOCOBALAMIN) 1000 MCG tablet Take 1,000 mcg by mouth daily.   Yes [provider]  vitamin C (ASCORBIC ACID) 500 MG tablet Take 500 mg by mouth daily.   Yes [provider]  Vitamin D, Cholecalciferol, 25 MCG (1000 UT) TABS Take 1,000 Units by mouth daily.   Yes [provider]  zinc gluconate 50 MG tablet Take 50 mg by mouth daily.   Yes [provider]  diazepam (VALIUM) 2 MG tablet Take 2 mg by mouth every 12 (twelve) hours as needed for anxiety. Patient not taking: Reported on 02/03/2021    [provider]   No Known Allergies Review of Systems Episodic chest pain, not experiencing chest pain currently Physical Exam Elderly appearing African-American female Resting in bed Currently on BiPAP Regular work of breathing Regular breath sounds S1-S2 monitor noted Has some edema Appears to have generalized weakness No focal deficits  Vital Signs: BP (!) 166/102 (BP Location: Right Arm)   Pulse 78   Temp 98.7 F (37.1 C) (Oral)   Resp 20   Ht _0  (1.499 m)   Wt 72 kg   SpO2 100%   BMI 32.06 kg/m  Pain Scale: 0-10   Pain Score: 0-No pain   SpO2: SpO2: 100 % O2 Device:SpO2: 100 % O2 Flow Rate: .   IO: Intake/output summary:  Intake/Output Summary (Last 24 hours) at 05/22/2021 1817 Last data filed at 05/22/2021 1752 Gross per 24 hour  Intake 62 ml  Output 1120 ml  Net -1058 ml     LBM:   Baseline Weight: Weight:  72 kg Most recent weight: Weight: 72 kg      Palliative Assessment/Data:   Flowsheet Rows    Flowsheet Row Most Recent Value  Intake Tab   Referral Department Hospitalist  Unit at Time of Referral Intermediate Care Unit  Palliative Care Primary Diagnosis Pulmonary  Date Notified 05/22/21  Palliative Care Type Return patient Palliative Care  Reason for referral Clarify Goals of Care  Date of Admission 05/22/21  Date first seen by Palliative Care 05/22/21  # of days Palliative referral response time 0 Day(s)  # of days IP prior to Palliative referral 0  Clinical Assessment   Palliative Performance Scale Score 40%  Psychosocial & Spiritual Assessment   Palliative Care Outcomes   Patient/Family meeting held? Yes  Who was at the meeting? Patient, daughter  Palliative Care Outcomes Clarified goals of care     PPS 40%  Time In:  1710 Time Out:  1810 Time Total:  60  Greater than 50%  of this time was spent counseling and coordinating care related to the above assessment and plan.  Signed by: Micheline Rough, MD   Please contact Palliative Medicine Team phone at [phone] for questions and concerns.  For individual provider: See Shea Evans

## 2021-05-22 NOTE — Progress Notes (Signed)
Pharmacy Consult for Pulmonary Hypertension Treatment   Indication - Continuation of prior to admission medication   Patient is 81 y.o.  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   Continuing this medication order as an inpatient requires that monitoring parameters per REMS requirements must be met.  Chronic therapy is under the supervision of Dr. Silas Flood who is enrolled in the REMS program and is being notified of continuation of therapy. A staff message in EPIC has been sent notifying the certified prescriber.  Per patient report has previously been educated on Pregnancy Risk and Hepatotoxicity . On admission pregnancy risk has been assessed and no monitoring required. Hepatic function has been evaluated. AST/ALT appropriate to continue medication at this time.     Latest Ref Rng & Units 05/07/2021    4:49 PM 04/01/2021   12:00 AM 03/31/2021    4:01 PM  Hepatic Function  Total Protein 6.5 - 8.1 g/dL 6.9   7.2   7.2    Albumin 3.5 - 5.0 g/dL 3.6    4.0    AST 15 - 41 U/L _0 ALT 0 - 44 U/L _1 Alk Phosphatase 38 - 126 U/L 37    45    Total Bilirubin 0.3 - 1.2 mg/dL 0.5   0.3   0.3      If any question arise or pregnancy is identified during hospitalization, contact for bosentan and macitentan: [phone]; ambrisentan: [phone].   Bonnita Nasuti Pharm.D. CPP, BCPS Clinical Pharmacist [phone] 05/22/2021 1:21 PM

## 2021-05-23 ENCOUNTER — Inpatient Hospital Stay (HOSPITAL_COMMUNITY): Payer: Medicare HMO

## 2021-05-23 DIAGNOSIS — E0590 Thyrotoxicosis, unspecified without thyrotoxic crisis or storm: Secondary | ICD-10-CM

## 2021-05-23 DIAGNOSIS — E669 Obesity, unspecified: Secondary | ICD-10-CM

## 2021-05-23 DIAGNOSIS — I5033 Acute on chronic diastolic (congestive) heart failure: Secondary | ICD-10-CM | POA: Diagnosis not present

## 2021-05-23 DIAGNOSIS — E785 Hyperlipidemia, unspecified: Secondary | ICD-10-CM | POA: Diagnosis not present

## 2021-05-23 DIAGNOSIS — N179 Acute kidney failure, unspecified: Secondary | ICD-10-CM | POA: Diagnosis not present

## 2021-05-23 DIAGNOSIS — J9621 Acute and chronic respiratory failure with hypoxia: Secondary | ICD-10-CM | POA: Diagnosis not present

## 2021-05-23 DIAGNOSIS — I10 Essential (primary) hypertension: Secondary | ICD-10-CM | POA: Diagnosis not present

## 2021-05-23 DIAGNOSIS — I272 Pulmonary hypertension, unspecified: Secondary | ICD-10-CM | POA: Diagnosis not present

## 2021-05-23 DIAGNOSIS — J9622 Acute and chronic respiratory failure with hypercapnia: Secondary | ICD-10-CM | POA: Diagnosis not present

## 2021-05-23 LAB — BASIC METABOLIC PANEL
Anion gap: 9 (ref 5–15)
BUN: 19 mg/dL (ref 8–23)
CO2: 39 mmol/L — ABNORMAL HIGH (ref 22–32)
Calcium: 9.1 mg/dL (ref 8.9–10.3)
Chloride: 89 mmol/L — ABNORMAL LOW (ref 98–111)
Creatinine, Ser: 1.19 mg/dL — ABNORMAL HIGH (ref 0.44–1.00)
GFR, Estimated: 46 mL/min — ABNORMAL LOW (ref >60)
Glucose, Bld: 85 mg/dL (ref 70–99)
Potassium: 3.7 mmol/L (ref 3.5–5.1)
Sodium: 137 mmol/L (ref 135–145)

## 2021-05-23 LAB — CBC
HCT: 28.3 % — ABNORMAL LOW (ref 36.0–46.0)
Hemoglobin: 8.9 g/dL — ABNORMAL LOW (ref 12.0–15.0)
MCH: 24.7 pg — ABNORMAL LOW (ref 26.0–34.0)
MCHC: 31.4 g/dL (ref 30.0–36.0)
MCV: 78.4 fL — ABNORMAL LOW (ref 80.0–100.0)
Platelets: 262 10*3/uL (ref 150–400)
RBC: 3.61 MIL/uL — ABNORMAL LOW (ref 3.87–5.11)
RDW: 18.2 % — ABNORMAL HIGH (ref 11.5–15.5)
WBC: 6 10*3/uL (ref 4.0–10.5)
nRBC: 0 % (ref 0.0–0.2)

## 2021-05-23 LAB — HEPATIC FUNCTION PANEL
ALT: 10 U/L (ref 0–44)
AST: 16 U/L (ref 15–41)
Albumin: 3.3 g/dL — ABNORMAL LOW (ref 3.5–5.0)
Alkaline Phosphatase: 34 U/L — ABNORMAL LOW (ref 38–126)
Bilirubin, Direct: 0.1 mg/dL (ref 0.0–0.2)
Indirect Bilirubin: 0.5 mg/dL (ref 0.3–0.9)
Total Bilirubin: 0.6 mg/dL (ref 0.3–1.2)
Total Protein: 6.3 g/dL — ABNORMAL LOW (ref 6.5–8.1)

## 2021-05-23 LAB — MAGNESIUM: Magnesium: 1.8 mg/dL (ref 1.7–2.4)

## 2021-05-23 LAB — TROPONIN I (HIGH SENSITIVITY): Troponin I (High Sensitivity): 92 ng/L — ABNORMAL HIGH (ref <18)

## 2021-05-23 MED ORDER — MORPHINE SULFATE (PF) 2 MG/ML IV SOLN: 2.0000 mg | Freq: Once | INTRAVENOUS | Status: DC

## 2021-05-23 MED ORDER — DAPAGLIFLOZIN PROPANEDIOL 10 MG PO TABS
10.0000 mg | ORAL_TABLET | Freq: Every day | ORAL | Status: DC
  Administered 2021-05-24 – 2021-05-25 (×2): 10 mg via ORAL
  Filled 2021-05-23 (×2): qty 1

## 2021-05-23 MED ORDER — IPRATROPIUM-ALBUTEROL 0.5-2.5 (3) MG/3ML IN SOLN
3.0000 mL | RESPIRATORY_TRACT | Status: DC | PRN
  Administered 2021-05-23: 3 mL via RESPIRATORY_TRACT
  Filled 2021-05-23: qty 3

## 2021-05-23 MED ORDER — FUROSEMIDE 10 MG/ML IJ SOLN
80.0000 mg | Freq: Once | INTRAMUSCULAR | Status: AC
  Administered 2021-05-23: 80 mg via INTRAVENOUS
  Filled 2021-05-23: qty 8

## 2021-05-23 MED ORDER — MAGNESIUM SULFATE 2 GM/50ML IV SOLN
2.0000 g | Freq: Once | INTRAVENOUS | Status: AC
  Administered 2021-05-23: 2 g via INTRAVENOUS
  Filled 2021-05-23: qty 50

## 2021-05-23 MED ORDER — POTASSIUM CHLORIDE CRYS ER 20 MEQ PO TBCR
40.0000 meq | EXTENDED_RELEASE_TABLET | Freq: Once | ORAL | Status: AC
  Administered 2021-05-23: 40 meq via ORAL
  Filled 2021-05-23: qty 2

## 2021-05-23 NOTE — ED Provider Notes (Signed)
2C [facility] PROGRESSIVE CARE Provider Note   CSN: [id number] Arrival date & time: 05/22/21  0535     History  Chief Complaint  Patient presents with   Shortness of Breath    Mandy [name] is a 81 y.o. female.  Significant language barrier, not able to obtain in house interpreter or our interpreting machine.   History from daughter is that the patient recently discharged from the hospital after diuresis. Has since gained another 5 pounds and became more dyspneic and orthopneic. Gained 5 pounds. Doubled her torsemide dose a few days ago and followed up with cardiology for same. Tonight after lying down it got significantly worse and EMS called. Hypoxic with them with increased RR>    Shortness of Breath     Home Medications Prior to Admission medications   Medication Sig Start Date End Date Taking? Authorizing Provider  acetaminophen (TYLENOL) 500 MG tablet Take 1,000 mg by mouth every 6 (six) hours as needed for fever or headache (pain).   Yes [provider]  albuterol (PROVENTIL) (2.5 MG/3ML) 0.083% nebulizer solution Take 2.5 mg by nebulization every 6 (six) hours as needed for wheezing or shortness of breath.   Yes [provider]  albuterol (VENTOLIN HFA) 108 (90 Base) MCG/ACT inhaler Inhale 2 puffs into the lungs every 6 (six) hours as needed for wheezing or shortness of breath. 06/30/20  Yes Thurnell Lose, MD  alendronate (FOSAMAX) 70 MG tablet Take 70 mg by mouth once a week.   Yes [provider]  atorvastatin (LIPITOR) 80 MG tablet Take 1 tablet (80 mg total) by mouth every evening. 02/22/21  Yes Shawna Clamp, MD  calcium carbonate (OSCAL) 1500 (600 Ca) MG TABS tablet Take 600 mg of elemental calcium by mouth 2 (two) times daily with a meal.   Yes [provider]  carvedilol (COREG) 12.5 MG tablet Take 1 tablet (12.5 mg total) by mouth 2 (two) times daily with a meal. 04/28/21  Yes Donato Heinz, MD  cetirizine  (ZYRTEC) 10 MG tablet Take 10 mg by mouth daily. 12/31/20  Yes [provider]  clopidogrel (PLAVIX) 75 MG tablet Take 1 tablet (75 mg total) by mouth daily. 05/12/21  Yes Bonnielee Haff, MD  diclofenac sodium (VOLTAREN) 1 % GEL Apply 4 g topically 4 (four) times daily as needed for pain. shoulder 09/13/18  Yes [provider]  fluticasone (FLONASE) 50 MCG/ACT nasal spray Place 1 spray into both nostrils daily. Patient taking differently: Place 1 spray into both nostrils daily as needed for allergies. 02/23/21  Yes Shawna Clamp, MD  gabapentin (NEURONTIN) 100 MG capsule Take 100 mg by mouth at bedtime. 07/18/18  Yes [provider]  guaiFENesin (MUCINEX) 600 MG 12 hr tablet Take 600 mg by mouth 2 (two) times daily as needed.   Yes [provider]  hydrALAZINE (APRESOLINE) 100 MG tablet Take 50 mg by mouth 3 (three) times daily. 03/28/21  Yes [provider]  macitentan (OPSUMIT) 10 MG tablet Take 1 tablet (10 mg total) by mouth daily. 04/05/21  Yes Hunsucker, Bonna Gains, MD  melatonin 5 MG TABS Take 10 mg by mouth at bedtime.   Yes [provider]  methimazole (TAPAZOLE) 5 MG tablet Take 0.5 tablets (2.5 mg total) by mouth daily. 02/04/19  Yes Hosie Poisson, MD  Multiple Vitamin (MULTIVITAMIN WITH MINERALS) TABS tablet Take 1 tablet by mouth every morning.   Yes [provider]  Omega-3 Fatty Acids (FISH OIL PO) Take  1 capsule by mouth every morning.   Yes [provider]  OXYGEN Inhale 4 L into the lungs continuous.   Yes [provider]  pantoprazole (PROTONIX) 40 MG tablet Take 1 tablet (40 mg total) by mouth daily at 6 (six) AM. Patient taking differently: Take 40 mg by mouth every morning. 06/30/20  Yes Thurnell Lose, MD  potassium chloride SA (KLOR-CON M) 20 MEQ tablet Take 1 tablet (20 mEq total) by mouth daily. 12/08/20  Yes Bonnielee Haff, MD  tadalafil, PAH, (ADCIRCA) 20 MG tablet Take 2 tablets (40 mg total) by  mouth daily. Start tadalafil when received from mail order pharmacy. STOP SILDENAFIL when you start tadalafil. Do NOT take both at the same time. 05/12/21  Yes Bonnielee Haff, MD  Torsemide 40 MG TABS Take 40 mg by mouth 2 (two) times daily. 05/12/21  Yes Bonnielee Haff, MD  vitamin B-12 (CYANOCOBALAMIN) 1000 MCG tablet Take 1,000 mcg by mouth daily.   Yes [provider]  vitamin C (ASCORBIC ACID) 500 MG tablet Take 500 mg by mouth daily.   Yes [provider]  Vitamin D, Cholecalciferol, 25 MCG (1000 UT) TABS Take 1,000 Units by mouth daily.   Yes [provider]  zinc gluconate 50 MG tablet Take 50 mg by mouth daily.   Yes [provider]      Allergies    Patient has no known allergies.    Review of Systems   Review of Systems  Respiratory:  Positive for shortness of breath.    Physical Exam Updated Vital Signs BP (!) 126/44 (BP Location: Left Arm)   Pulse 100   Temp 98.7 F (37.1 C) (Oral)   Resp 16   Ht '4\' 11"'$  (1.499 m)   Wt 72 kg   SpO2 98%   BMI 32.06 kg/m  Physical Exam Vitals and nursing note reviewed.  Constitutional:      Appearance: She is well-developed.  HENT:     Head: Normocephalic and atraumatic.  Neck:     Vascular: JVD (mild) present.  Cardiovascular:     Rate and Rhythm: Normal rate and regular rhythm.  Pulmonary:     Effort: No respiratory distress.     Breath sounds: No stridor. Examination of the right-lower field reveals decreased breath sounds. Examination of the left-lower field reveals decreased breath sounds. Decreased breath sounds present.  Abdominal:     General: There is no distension.  Musculoskeletal:     Cervical back: Normal range of motion.     Right lower leg: Edema present.     Left lower leg: Edema present.  Skin:    General: Skin is warm and dry.  Neurological:     General: No focal deficit present.     Mental Status: She is alert.    ED Results / Procedures / Treatments   Labs (all  labs ordered are listed, but only abnormal results are displayed) Labs Reviewed  BASIC METABOLIC PANEL - Abnormal; Notable for the following components:      Result Value   Sodium 132 (*)    Chloride 88 (*)    CO2 36 (*)    Glucose, Bld 134 (*)    BUN 26 (*)    Creatinine, Ser 1.33 (*)    GFR, Estimated 40 (*)    All other components within normal limits  CBC - Abnormal; Notable for the following components:   RBC 3.81 (*)    Hemoglobin 9.4 (*)  HCT 31.0 (*)    MCH 24.7 (*)    RDW 18.2 (*)    All other components within normal limits  BRAIN NATRIURETIC PEPTIDE - Abnormal; Notable for the following components:   B Natriuretic Peptide 176.8 (*)    All other components within normal limits  BASIC METABOLIC PANEL - Abnormal; Notable for the following components:   Chloride 89 (*)    CO2 39 (*)    Creatinine, Ser 1.19 (*)    GFR, Estimated 46 (*)    All other components within normal limits  CBC - Abnormal; Notable for the following components:   RBC 3.61 (*)    Hemoglobin 8.9 (*)    HCT 28.3 (*)    MCV 78.4 (*)    MCH 24.7 (*)    RDW 18.2 (*)    All other components within normal limits  HEPATIC FUNCTION PANEL - Abnormal; Notable for the following components:   Total Protein 6.3 (*)    Albumin 3.3 (*)    Alkaline Phosphatase 34 (*)    All other components within normal limits  TROPONIN I (HIGH SENSITIVITY) - Abnormal; Notable for the following components:   Troponin I (High Sensitivity) 86 (*)    All other components within normal limits  TROPONIN I (HIGH SENSITIVITY) - Abnormal; Notable for the following components:   Troponin I (High Sensitivity) 96 (*)    All other components within normal limits  MAGNESIUM    EKG EKG Interpretation  Date/Time:  Saturday May 22 2021 05:38:11 EDT Ventricular Rate:  98 PR Interval:  131 QRS Duration: 93 QT Interval:  364 QTC Calculation: 465 R Axis:   88 Text Interpretation: Sinus rhythm Borderline right axis deviation  Confirmed by Merrily Pew [phone]) on 05/22/2021 6:53:13 AM  Radiology DG Chest Portable 1 View  Result Date: 05/22/2021 CLINICAL DATA:  81 year old female with history of shortness of breath. EXAM: PORTABLE CHEST 1 VIEW COMPARISON:  Chest x-ray 05/07/2021. FINDINGS: Lung volumes are low. Extensive bibasilar opacities (right greater than left) which may reflect areas of atelectasis and/or consolidation, with superimposed enlarging moderate to large right and moderate left pleural effusions. Patchy areas of interstitial prominence are noted throughout the mid lungs bilaterally and in the right upper lobe. No pneumothorax. Pulmonary vasculature appears engorged. Heart size is moderately enlarged. Upper mediastinal contours are distorted by patient positioning. Atherosclerotic calcifications in the thoracic aorta. IMPRESSION: 1. The appearance of the chest remains most concerning for congestive heart failure, as above. 2. Aortic atherosclerosis. Electronically Signed   By: Vinnie Langton M.D.   On: 05/22/2021 07:32    Procedures .Critical Care Performed by: Merrily Pew, MD Authorized by: Merrily Pew, MD   Critical care provider statement:    Critical care time (minutes):  30   Critical care was necessary to treat or prevent imminent or life-threatening deterioration of the following conditions:  Respiratory failure   Critical care was time spent personally by me on the following activities:  Development of treatment plan with patient or surrogate, discussions with consultants, evaluation of patient's response to treatment, examination of patient, ordering and review of laboratory studies, ordering and review of radiographic studies, ordering and performing treatments and interventions, pulse oximetry, re-evaluation of patient's condition and review of old charts    Medications Ordered in ED Medications  atorvastatin (LIPITOR) tablet 80 mg (80 mg Oral Not Given 05/22/21 1722)  carvedilol (COREG)  tablet 12.5 mg (12.5 mg Oral Not Given 05/22/21 1722)  hydrALAZINE (APRESOLINE) tablet 50  mg (50 mg Oral Not Given 05/22/21 2239)  methimazole (TAPAZOLE) tablet 2.5 mg (2.5 mg Oral Not Given 05/22/21 1439)  pantoprazole (PROTONIX) EC tablet 40 mg (40 mg Oral Not Given 05/22/21 1439)  clopidogrel (PLAVIX) tablet 75 mg (75 mg Oral Not Given 05/22/21 1438)  melatonin tablet 10 mg (10 mg Oral Not Given 05/22/21 2240)  gabapentin (NEURONTIN) capsule 100 mg (100 mg Oral Not Given 05/22/21 2239)  loratadine (CLARITIN) tablet 10 mg (10 mg Oral Not Given 05/22/21 1439)  furosemide (LASIX) injection 80 mg (80 mg Intravenous Given 05/22/21 1748)  sodium chloride flush (NS) 0.9 % injection 3 mL (3 mLs Intravenous Given 05/22/21 2240)  acetaminophen (TYLENOL) tablet 650 mg (has no administration in time range)    Or  acetaminophen (TYLENOL) suppository 650 mg (has no administration in time range)  oxyCODONE (Oxy IR/ROXICODONE) immediate release tablet 5 mg (has no administration in time range)  morphine (PF) 2 MG/ML injection 2 mg (2 mg Intravenous Given 05/22/21 1816)  traZODone (DESYREL) tablet 25 mg (has no administration in time range)  docusate sodium (COLACE) capsule 100 mg (100 mg Oral Not Given 05/22/21 2239)  polyethylene glycol (MIRALAX / GLYCOLAX) packet 17 g (has no administration in time range)  bisacodyl (DULCOLAX) EC tablet 5 mg (has no administration in time range)  ondansetron (ZOFRAN) tablet 4 mg (has no administration in time range)    Or  ondansetron (ZOFRAN) injection 4 mg (has no administration in time range)  hydrALAZINE (APRESOLINE) injection 5 mg (has no administration in time range)  enoxaparin (LOVENOX) injection 40 mg (40 mg Subcutaneous Given 05/22/21 1748)  sildenafil (REVATIO) tablet 20 mg (20 mg Oral Not Given 05/22/21 2240)  ipratropium-albuterol (DUONEB) 0.5-2.5 (3) MG/3ML nebulizer solution 3 mL (3 mLs Nebulization Given 05/22/21 1912)  guaiFENesin (MUCINEX) 12 hr tablet 600 mg (has  no administration in time range)  furosemide (LASIX) 120 mg in dextrose 5 % 50 mL IVPB (0 mg Intravenous Stopped 05/22/21 0931)    ED Course/ Medical Decision Making/ A&P                           Medical Decision Making Amount and/or Complexity of Data Reviewed Labs: ordered. Radiology: ordered.  Risk Decision regarding hospitalization.   Here with acute on chronic hypoxic respiratory failure in what appears to be related to fluid overload. Started Bipap. Lasix ordered. Still in distress. Pending vbg, xr and rest of labs prior to admission. Care transferred pending same.    Final Clinical Impression(s) / ED Diagnoses Final diagnoses:  None    Rx / DC Orders ED Discharge Orders     None         Catelynn Sparger, Corene Cornea, MD 05/23/21 [id number]

## 2021-05-23 NOTE — Progress Notes (Signed)
Progress Note  Patient Name: Mandy [name] Date of Encounter: 05/23/2021  [facility] Cardiologist: Donato Heinz, MD   Subjective   Net -1.7 L yesterday.  BP 158/64 this morning.  Creatinine improved (1.33 > 1.19).  Off BiPAP, on 3 L [place].    Called her daughter [name] who translated for [place]. Patient denies any dyspnea or chest pain.  Inpatient Medications    Scheduled Meds:  atorvastatin  80 mg Oral QPM   carvedilol  12.5 mg Oral BID WC   clopidogrel  75 mg Oral Daily   docusate sodium  100 mg Oral BID   enoxaparin (LOVENOX) injection  40 mg Subcutaneous Q24H   furosemide  80 mg Intravenous BID   gabapentin  100 mg Oral QHS   hydrALAZINE  50 mg Oral TID   loratadine  10 mg Oral Daily   melatonin  10 mg Oral QHS   methimazole  2.5 mg Oral Daily   pantoprazole  40 mg Oral q morning   sildenafil  20 mg Oral TID   sodium chloride flush  3 mL Intravenous Q12H   Continuous Infusions:  PRN Meds: acetaminophen **OR** acetaminophen, bisacodyl, guaiFENesin, hydrALAZINE, ipratropium-albuterol, morphine injection, ondansetron **OR** ondansetron (ZOFRAN) IV, oxyCODONE, polyethylene glycol, traZODone   Vital Signs    Vitals:   05/23/21 0000 05/23/21 0259 05/23/21 0739 05/23/21 0801  BP: (!) 147/53 (!) 126/44 (!) 158/64   Pulse: 94 100 91   Resp: '20 16 13   '$ Temp:  98.7 F (37.1 C) 97.8 F (36.6 C)   TempSrc:  Oral Axillary   SpO2: 100% 98% 97% 99%  Weight:      Height:        Intake/Output Summary (Last 24 hours) at 05/23/2021 0832 Last data filed at 05/23/2021 0152 Gross per 24 hour  Intake 542 ml  Output 2220 ml  Net -1678 ml      05/22/2021    9:32 AM 05/22/2021    5:45 AM 05/14/2021    1:43 PM  Last 3 Weights  Weight (lbs) 158 lb 11.7 oz 158 lb 11.7 oz 160 lb  Weight (kg) 72 kg 72 kg 72.576 kg      Telemetry    Normal sinus rhythm in the 90s- Personally Reviewed  ECG    No new ECG- Personally Reviewed  Physical Exam   GEN: No acute  distress.   Neck: + JVD Cardiac: RRR, no murmurs Respiratory: Scattered expiratory wheezing GI: Soft, nontender MS: No edema Neuro:  Nonfocal  Psych: Normal affect   Labs    High Sensitivity Troponin:   Recent Labs  Lab 05/07/21 1649 05/07/21 1852 05/22/21 0558 05/22/21 0733  TROPONINIHS 86* 85* 86* 96*     Chemistry Recent Labs  Lab 05/22/21 0558 05/23/21 0107  NA 132* 137  K 4.6 3.7  CL 88* 89*  CO2 36* 39*  GLUCOSE 134* 85  BUN 26* 19  CREATININE 1.33* 1.19*  CALCIUM 9.3 9.1  MG  --  1.8  PROT  --  6.3*  ALBUMIN  --  3.3*  AST  --  16  ALT  --  10  ALKPHOS  --  34*  BILITOT  --  0.6  GFRNONAA 40* 46*  ANIONGAP 8 9    Lipids No results for input(s): CHOL, TRIG, HDL, LABVLDL, LDLCALC, CHOLHDL in the last 168 hours.  Hematology Recent Labs  Lab 05/22/21 0733 05/23/21 0107  WBC 5.4 6.0  RBC 3.81* 3.61*  HGB 9.4*  8.9*  HCT 31.0* 28.3*  MCV 81.4 78.4*  MCH 24.7* 24.7*  MCHC 30.3 31.4  RDW 18.2* 18.2*  PLT 263 262   Thyroid No results for input(s): TSH, FREET4 in the last 168 hours.  BNP Recent Labs  Lab 05/22/21 0733  BNP 176.8*    DDimer No results for input(s): DDIMER in the last 168 hours.   Radiology    DG Chest Portable 1 View  Result Date: 05/22/2021 CLINICAL DATA:  81 year old female with history of shortness of breath. EXAM: PORTABLE CHEST 1 VIEW COMPARISON:  Chest x-ray 05/07/2021. FINDINGS: Lung volumes are low. Extensive bibasilar opacities (right greater than left) which may reflect areas of atelectasis and/or consolidation, with superimposed enlarging moderate to large right and moderate left pleural effusions. Patchy areas of interstitial prominence are noted throughout the mid lungs bilaterally and in the right upper lobe. No pneumothorax. Pulmonary vasculature appears engorged. Heart size is moderately enlarged. Upper mediastinal contours are distorted by patient positioning. Atherosclerotic calcifications in the thoracic aorta.  IMPRESSION: 1. The appearance of the chest remains most concerning for congestive heart failure, as above. 2. Aortic atherosclerosis. Electronically Signed   By: Vinnie Langton M.D.   On: 05/22/2021 07:32    Cardiac Studies   Echo 05/08/21:  1. Left ventricular ejection fraction, by estimation, is 55 to 60%. The  left ventricle has normal function. The left ventricle has no regional  wall motion abnormalities. There is severe left ventricular hypertrophy.  Left ventricular diastolic parameters   are consistent with Grade II diastolic dysfunction (pseudonormalization).  Elevated left atrial pressure.   2. Right ventricular systolic function is moderately reduced. The right  ventricular size is mildly enlarged. There is severely elevated pulmonary  artery systolic pressure.   3. A small pericardial effusion is present. Moderate pleural effusion in  the right lateral region.   4. The mitral valve is normal in structure. Mild mitral valve  regurgitation. No evidence of mitral stenosis.   5. Tricuspid valve regurgitation is moderate.   6. The aortic valve is tricuspid. Aortic valve regurgitation is not  visualized. No aortic stenosis is present.   7. The inferior vena cava is dilated in size with >50% respiratory  variability, suggesting right atrial pressure of 8 mmHg.    Patient Profile     81 y.o. 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  She appears volume overloaded. -Good diuresis yesterday, appears improved today and has been weaned off BiPAP and now on 3 L [place].  Would continue IV Lasix 80 mg twice daily -Pulmonology following, suspect Opsumit contributing to diastolic heart failure exacerbation.  Discontinued Opsumit and tadalafil and restarted on sildenafil 20 mg 3 times daily   Pulmonary hypertension: LHC/RHC 11/30/2020 showed nonobstructive CAD (30% mid LAD, 30% distal LAD), moderate pulm hypertension (RA 2, RV 76/0, PA 72/16/38, PW 18, LVEDP 9, CI 3.6).  Follows with Dr. Silas Flood.  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  On tadalafil and Opsumit.   -Discontinuing  Opsumit and tadalafil and restarting sildenafil per Dr. Silas Flood   Acute on chronic hypoxic respiratory failure: Multifactorial with diastolic heart failure and severe pulmonary hypertension contributing.  Required BiPAP initially, now improved and on 3 L [place].  Pulmonology following.  Continue IV diuresis  For questions or updates, please contact [place] Please consult [email or web address] for contact info under        Signed, Donato Heinz, MD  05/23/2021, 8:32 AM

## 2021-05-23 NOTE — Evaluation (Signed)
Physical Therapy Evaluation Patient Details Name: Mandy [name] MRN: [id number] DOB: 1940-05-31 Today's Date: 05/23/2021  History of Present Illness  Pt is a 81 y.o. F who presents 05/22/2021 for evaluation of disatolic heart failure and pulmonary hypertension. 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  Clinical Impression  Pt admitted with above. Presents with impaired standing balance, weakness, and decreased activity tolerance. Pt ambulating room distances with a walker at a min guard assist level. HR 88-120 bpm, desat to 86% on 3L O2, bumped up to 4L O2 for mobility with increase > 90%. Pt deferring further gait or sitting up in chair, stating, "I want to sleep." Will continue to follow acutely.     Recommendations for follow up therapy are one component of a multi-disciplinary discharge planning process, led by the attending physician.  Recommendations may be updated based on patient status, additional functional criteria and insurance authorization.  Follow Up Recommendations Home health PT    Assistance Recommended at Discharge Frequent or constant Supervision/Assistance  Patient can return home with the following  A little help with walking and/or transfers;A little help with bathing/dressing/bathroom;Assistance with cooking/housework;Direct supervision/assist for medications management;Direct supervision/assist for financial management;Assist for transportation;Help with stairs or ramp for entrance    Equipment Recommendations None recommended by PT  Recommendations for Other Services       Functional Status Assessment Patient has had a recent decline in their functional status and demonstrates the ability to make significant improvements in function in a reasonable and predictable amount of time.     Precautions /  Restrictions Precautions Precautions: Fall;Other (comment) Precaution Comments: watch O2 Restrictions Weight Bearing Restrictions: No      Mobility  Bed Mobility Overal bed mobility: Modified Independent                  Transfers Overall transfer level: Needs assistance Equipment used: Rolling walker (2 wheels) Transfers: Sit to/from Stand Sit to Stand: Supervision, Min guard           General transfer comment: supervision from edge of bed, min guard from toilet    Ambulation/Gait Ambulation/Gait assistance: Min guard Gait Distance (Feet): 25 Feet Assistive device: Rolling walker (2 wheels) Gait Pattern/deviations: Step-through pattern, Decreased stride length, Wide base of support Gait velocity: decreased     General Gait Details: Slow, mildly unsteady gait, requiring min guard assist.  Stairs            Wheelchair Mobility    Modified Rankin (Stroke Patients Only)       Balance Overall balance assessment: Needs assistance Sitting-balance support: Feet supported, No upper extremity supported Sitting balance-Leahy Scale: Good     Standing balance support: During functional activity, Reliant on assistive device for balance Standing balance-Leahy Scale: Poor Standing balance comment: Reliant on Rw                             Pertinent Vitals/Pain Pain Assessment Pain Assessment: Faces Faces Pain Scale: No hurt    Home Living Family/patient expects to be discharged to:: Private residence Living Arrangements: Children Available Help at Discharge: Family;Available 24 hours/day Type of Home: House Home Access: Level entry       Home Layout: One level Home Equipment: Conservation officer, nature (2 wheels);BSC/3in1;Shower seat      Prior Function Prior Level of Function :  Needs assist             Mobility Comments: using RW ADLs Comments: per previous admission patient has assist as needed from family for self care     Hand  Dominance   Dominant Hand: Right    Extremity/Trunk Assessment   Upper Extremity Assessment Upper Extremity Assessment: Overall WFL for tasks assessed    Lower Extremity Assessment Lower Extremity Assessment: Generalized weakness    Cervical / Trunk Assessment Cervical / Trunk Assessment: Normal  Communication   Communication: Prefers language other than English [place], no interpreter available)  Cognition Arousal/Alertness: Awake/alert Behavior During Therapy: WFL for tasks assessed/performed Overall Cognitive Status: Difficult to assess                                          General Comments      Exercises     Assessment/Plan    PT Assessment Patient needs continued PT services  PT Problem List Decreased strength;Decreased activity tolerance;Decreased balance;Decreased coordination;Decreased knowledge of use of DME;Decreased safety awareness       PT Treatment Interventions DME instruction;Gait training;Functional mobility training;Therapeutic activities;Therapeutic exercise;Balance training;Neuromuscular re-education;Patient/family education    PT Goals (Current goals can be found in the Care Plan section)  Acute Rehab PT Goals Patient Stated Goal: "to sleep" PT Goal Formulation: With patient Time For Goal Achievement: 06/06/21 Potential to Achieve Goals: Good    Frequency Min 3X/week     Co-evaluation               AM-PAC PT "6 Clicks" Mobility  Outcome Measure Help needed turning from your back to your side while in a flat bed without using bedrails?: None Help needed moving from lying on your back to sitting on the side of a flat bed without using bedrails?: None Help needed moving to and from a bed to a chair (including a wheelchair)?: A Little Help needed standing up from a chair using your arms (e.g., wheelchair or bedside chair)?: A Little Help needed to walk in hospital room?: A Little Help needed climbing 3-5 steps with a  railing? : A Lot 6 Click Score: 19    End of Session Equipment Utilized During Treatment: Oxygen Activity Tolerance: Patient tolerated treatment well Patient left: in bed;with call bell/phone within reach Nurse Communication: Mobility status PT Visit Diagnosis: Unsteadiness on feet (R26.81);Muscle weakness (generalized) (M62.81);Difficulty in walking, not elsewhere classified (R26.2)    Time: 2637-8588 PT Time Calculation (min) (ACUTE ONLY): 23 min   Charges:   PT Evaluation $PT Eval Moderate Complexity: 1 Mod PT Treatments $Therapeutic Activity: 8-22 mins        Wyona Almas, PT, DPT Acute Rehabilitation Services Pager [phone] Office [phone]   Deno Etienne 05/23/2021, 12:52 PM

## 2021-05-23 NOTE — Progress Notes (Signed)
Progress Note   Patient: Mandy [name] ZOX:[id number] DOB: 08-28-1940 DOA: 05/22/2021     1 DOS: the patient was seen and examined on 05/23/2021   Brief hospital course: Mandy [name] was admitted to the hospital with the working diagnosis of decompensated heart failure.  81 yo female with the past medical history of coronary artery disease, type 2 DM, hypertension and severe pulmonary hypertension who presented with dyspnea. Recent hospitalization 05/05 to 05/12/21 for decompensated heart failure, pulmonary hypertension. Not able to get home Cpap/bipap. At home she had recurrence of worsening dyspnea along with weight gain. As outpatient her diuretic therapy was increased, but unfortunately her symptoms continue to worsen prompting her to come back to the hospital. On her initial physical examination patient was in respiratory distress and was placed on non invasive mechanical ventilation. Her blood pressure was 188/61, HR 83, RR 20 and 02 saturation 95%, lungs with no wheezing or rhonchi, heart with S1 and S2 present and rhythmic, abdomen not distended, no  lower extremity edema.    Na 132, K 4,6, Cl 88, bicarbonate at 36, glucose 134, bun 26 cr 1,33 High sensitive troponin 86 and 96  Wbc 5,4 hgb 9,4 plt 263   Chest radiograph with cardiomegaly, bilateral interstitial infiltrates predominant lower lobes and right upper lobe, small bilateral pleural effusions. Hypoinflation.   EKG 98 bpm, normal axis, normal intervals, sinus rhythm with no significant ST segment or T wave changes.      Assessment and Plan: * Acute on chronic diastolic CHF (congestive heart failure) (HCC) Echocardiogram with LV systolic function preserved 55 to 60%, with severe left ventricular hypertrophy. Moderate reduction in RV systolic function, severely elevated pulmonary artery systolic pressure. Small pericardial effusion. Moderate tricuspid valve regurgitation.   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   Urine output over last 24 hrs is 8,119 ml Systolic blood pressure is 158 to 122 mmHg.  Plan to continue diuresis with furosemide 80 mg IV q12 hrs Continue with carvedilol and hydralazine.  Add empagliflozin  Continue sildenafil for pulmonary hypertension.   Acute on chronic hypoxemic respiratory failure due to cardiogenic pulmonary edema,.  Patient has responded to diuresis well, now liberated from non invasive mechanical ventilation.  Her oxygenation is 99% on 4 L/min per [name] Add nasal steroids and saline for sinus congestion.     AKI (acute kidney injury) ([place]) Hypomagnsemia.   Renal function with serum cr at 1,19 with K at 3,7 and serum bicarbonate at 39. Plan to continue diuresis with furosemide. Add 40 meq Kcl to prevent hypokalemia.  Add 2 g Mag sulfate.  Plan to follow up renal function and electrolytes in am.   Essential hypertension Continue blood pressure control with carvedilol and hydralazine.   Dyslipidemia Continue with statin therapy.   Hyperthyroidism Continue with methimazole.   Class 1 obesity Calculated BMI is 32,0         Subjective: Patient with improvement of dyspnea but not back to her baseline, positive mild chest pain. Her daughter is at the bedside and assisting in interview.   Physical Exam: Vitals:   05/23/21 0259 05/23/21 0739 05/23/21 0801 05/23/21 1130  BP: (!) 126/44 (!) 158/64  (!) 122/50  Pulse: 100 91  86  Resp: '16 13  20  '$ Temp: 98.7 F (37.1 C) 97.8 F (36.6 C)  98.1 F (36.7 C)  TempSrc: Oral Axillary  Oral  SpO2: 98% 97% 99% 90%  Weight:      Height:  Neurology awake and alert ENT with mild pallor Cardiovascular with S1 and S2 present and rhythmic with no gallops, rubs or murmurs Mild to moderate JVD Lungs with rales scattered with no wheezing Abdomen not distended No lower extremity edema  Data Reviewed:    Family Communication: I spoke with patient's daughter at the bedside, we talked  in detail about patient's condition, plan of care and prognosis and all questions were addressed.   Disposition: Status is: Inpatient Remains inpatient appropriate because: IV diuresis heart failure   Planned Discharge Destination: Home  Author: Tawni Millers, MD 05/23/2021 3:23 PM  For on call review [email or web address].

## 2021-05-23 NOTE — Assessment & Plan Note (Addendum)
Continue blood pressure control with carvedilol and hydralazine.

## 2021-05-23 NOTE — Progress Notes (Signed)
Pt complaining of chest pain rates 2 out of 10 prn morphine given with relief MD notified

## 2021-05-23 NOTE — Assessment & Plan Note (Addendum)
CKD stage 3a, Hypomagnsemia. Metabolic alkalosis   Patient with improved volume status, at the time of her discharge her renal function has a serum cr of 1,31 with K at 4,1 and serum bicarbonate at 36. Continue diuresis with torsemide and follow up renal function as outpatient.

## 2021-05-23 NOTE — Hospital Course (Addendum)
Mandy [name] was admitted to the hospital with the working diagnosis of decompensated heart failure.  81 yo female with the past medical history of coronary artery disease, type 2 DM, hypertension and severe pulmonary hypertension who presented with dyspnea. Recent hospitalization 05/05 to 05/12/21 for decompensated heart failure, pulmonary hypertension. Not able to get home Cpap/bipap. At home she had recurrence of worsening dyspnea along with weight gain. As outpatient her diuretic therapy was increased, but unfortunately her symptoms continue to worsen prompting her to come back to the hospital. On her initial physical examination patient was in respiratory distress and was placed on non invasive mechanical ventilation. Her blood pressure was 188/61, HR 83, RR 20 and 02 saturation 95%, lungs with no wheezing or rhonchi, heart with S1 and S2 present and rhythmic, abdomen not distended, no  lower extremity edema.    Na 132, K 4,6, Cl 88, bicarbonate at 36, glucose 134, bun 26 cr 1,33 High sensitive troponin 86 and 96  Wbc 5,4 hgb 9,4 plt 263   Chest radiograph with cardiomegaly, bilateral interstitial infiltrates predominant lower lobes and right upper lobe, small bilateral pleural effusions. Hypoinflation.   EKG 98 bpm, normal axis, normal intervals, sinus rhythm with no significant ST segment or T wave changes.   Patient has been placed on furosemide for diuresis with improvement in oxygenation. Liberated from non invasive mechanical ventilation and transitioned to high flow nasal cannula.

## 2021-05-23 NOTE — Assessment & Plan Note (Addendum)
Patient was admitted to the cardiac ward and received IV furosemide for diuresis, negative fluid balance was achieved with significant improvement in her symptoms.   Echocardiogram with LV systolic function preserved 55 to 60%, with severe left ventricular hypertrophy. Moderate reduction in RV systolic function, severely elevated pulmonary artery systolic pressure. Small pericardial effusion. Moderate tricuspid valve regurgitation.   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   Patient will continue heart failure management with carvedilol, hydralazine and empagliflozin.   Patient with pulmonary hypertension group 1 and 2. Dr Silas Flood recommended stopping tadalafil and Opsumit and restarting patient on sildenafil.   Acute on chronic hypoxemic respiratory failure due to cardiogenic pulmonary edema,.  Patient has responded to diuresis well. At the time of her discharge her dyspnea has improved, her oxygenation is 100% on room air.

## 2021-05-23 NOTE — Assessment & Plan Note (Signed)
Continue with statin therapy.  ?

## 2021-05-23 NOTE — Assessment & Plan Note (Signed)
Hyperthyroidism  Continue with methimazole.  

## 2021-05-23 NOTE — Plan of Care (Signed)
  Problem: Activity: Goal: Capacity to carry out activities will improve Outcome: Progressing   Problem: Cardiac: Goal: Ability to achieve and maintain adequate cardiopulmonary perfusion will improve Outcome: Progressing   Problem: Clinical Measurements: Goal: Will remain free from infection Outcome: Progressing

## 2021-05-23 NOTE — Assessment & Plan Note (Signed)
Calculated BMI is 32,0

## 2021-05-23 NOTE — Progress Notes (Signed)
Pt requested to be taken off BiPAP. RT placed pt on 5L [place], SpO2 98% and comfortable at this moment with no increased WOB. Will continue to monitor.

## 2021-05-24 ENCOUNTER — Other Ambulatory Visit (HOSPITAL_COMMUNITY): Payer: Self-pay

## 2021-05-24 DIAGNOSIS — R531 Weakness: Secondary | ICD-10-CM | POA: Diagnosis not present

## 2021-05-24 DIAGNOSIS — N179 Acute kidney failure, unspecified: Secondary | ICD-10-CM | POA: Diagnosis not present

## 2021-05-24 DIAGNOSIS — I272 Pulmonary hypertension, unspecified: Secondary | ICD-10-CM

## 2021-05-24 DIAGNOSIS — E785 Hyperlipidemia, unspecified: Secondary | ICD-10-CM | POA: Diagnosis not present

## 2021-05-24 DIAGNOSIS — Z515 Encounter for palliative care: Secondary | ICD-10-CM

## 2021-05-24 DIAGNOSIS — I5033 Acute on chronic diastolic (congestive) heart failure: Secondary | ICD-10-CM | POA: Diagnosis not present

## 2021-05-24 DIAGNOSIS — I10 Essential (primary) hypertension: Secondary | ICD-10-CM | POA: Diagnosis not present

## 2021-05-24 LAB — BASIC METABOLIC PANEL
Anion gap: 6 (ref 5–15)
BUN: 19 mg/dL (ref 8–23)
CO2: 40 mmol/L — ABNORMAL HIGH (ref 22–32)
Calcium: 8.6 mg/dL — ABNORMAL LOW (ref 8.9–10.3)
Chloride: 93 mmol/L — ABNORMAL LOW (ref 98–111)
Creatinine, Ser: 1.2 mg/dL — ABNORMAL HIGH (ref 0.44–1.00)
GFR, Estimated: 46 mL/min — ABNORMAL LOW (ref >60)
Glucose, Bld: 122 mg/dL — ABNORMAL HIGH (ref 70–99)
Potassium: 3.8 mmol/L (ref 3.5–5.1)
Sodium: 139 mmol/L (ref 135–145)

## 2021-05-24 LAB — MAGNESIUM: Magnesium: 2.2 mg/dL (ref 1.7–2.4)

## 2021-05-24 MED ORDER — POTASSIUM CHLORIDE CRYS ER 20 MEQ PO TBCR
20.0000 meq | EXTENDED_RELEASE_TABLET | Freq: Once | ORAL | Status: DC
  Filled 2021-05-24: qty 1

## 2021-05-24 MED ORDER — ENSURE ENLIVE PO LIQD
237.0000 mL | Freq: Two times a day (BID) | ORAL | Status: DC
  Administered 2021-05-25: 237 mL via ORAL

## 2021-05-24 MED ORDER — ADULT MULTIVITAMIN W/MINERALS CH: 1.0000 | ORAL_TABLET | Freq: Every day | ORAL | Status: DC

## 2021-05-24 MED ORDER — POTASSIUM CHLORIDE CRYS ER 20 MEQ PO TBCR
40.0000 meq | EXTENDED_RELEASE_TABLET | Freq: Once | ORAL | Status: AC
  Administered 2021-05-24: 40 meq via ORAL
  Filled 2021-05-24: qty 2

## 2021-05-24 MED ORDER — RENA-VITE PO TABS
1.0000 | ORAL_TABLET | Freq: Every day | ORAL | Status: DC
  Administered 2021-05-24: 1 via ORAL
  Filled 2021-05-24: qty 1

## 2021-05-24 NOTE — Progress Notes (Signed)
Progress Note  Patient Name: Mandy [name] Date of Encounter: 05/24/2021  Primary Cardiologist: Donato Heinz, MD   Subjective   Patient seen and examined at her bedside.  She is awake when I arrived.  I was able to communicate with the patient as I do speak [place] therefore no interpreter was needed.  Net loss -660 milliliters over the last 24 hours.  She offers no complaints at this time.  She denies any chest pain or shortness of breath.  Still on nasal cannula 3 L.  Inpatient Medications    Scheduled Meds:  atorvastatin  80 mg Oral QPM   carvedilol  12.5 mg Oral BID WC   clopidogrel  75 mg Oral Daily   dapagliflozin propanediol  10 mg Oral Daily   docusate sodium  100 mg Oral BID   enoxaparin (LOVENOX) injection  40 mg Subcutaneous Q24H   furosemide  80 mg Intravenous BID   gabapentin  100 mg Oral QHS   hydrALAZINE  50 mg Oral TID   loratadine  10 mg Oral Daily   melatonin  10 mg Oral QHS   methimazole  2.5 mg Oral Daily    morphine injection  2 mg Intravenous Once   pantoprazole  40 mg Oral q morning   sildenafil  20 mg Oral TID   sodium chloride flush  3 mL Intravenous Q12H   Continuous Infusions:  PRN Meds: acetaminophen **OR** acetaminophen, bisacodyl, guaiFENesin, hydrALAZINE, ipratropium-albuterol, morphine injection, ondansetron **OR** ondansetron (ZOFRAN) IV, oxyCODONE, polyethylene glycol, traZODone   Vital Signs    Vitals:   05/23/21 2223 05/24/21 0028 05/24/21 0500 05/24/21 0751  BP: (!) 119/57 (!) 118/54  130/60  Pulse:  87  77  Resp:  (!) 23  18  Temp:  98.6 F (37 C)  98.4 F (36.9 C)  TempSrc:  Oral  Oral  SpO2:  100%  100%  Weight:  68 kg 68 kg   Height:        Intake/Output Summary (Last 24 hours) at 05/24/2021 0755 Last data filed at 05/23/2021 2030 Gross per 24 hour  Intake 240 ml  Output 900 ml  Net -660 ml   Filed Weights   05/22/21 0932 05/24/21 0028 05/24/21 0500  Weight: 72 kg 68 kg 68 kg    Telemetry     Sinus rhythm with rare PVCs- Personally Reviewed  ECG    None- Personally Reviewed  Physical Exam    General: Comfortable, lying in bed when I arrived awake Head: Atraumatic, normal size  Eyes: PEERLA, EOMI  Neck: Supple, normal JVD Cardiac: Normal S1, S2; RRR; no murmurs, rubs, or gallops Lungs: Clear to auscultation bilaterally Abd: Soft, nontender, no hepatomegaly  Ext: warm, trace bilateral lower extremity edema Musculoskeletal: No deformities, BUE and BLE strength normal and equal Skin: Warm and dry, no rashes   Neuro: Alert and oriented to person, place, time, and situation, CNII-XII grossly intact, no focal deficits  Psych: Normal mood and affect   Labs    Chemistry Recent Labs  Lab 05/22/21 0558 05/23/21 0107 05/24/21 0053  NA 132* 137 139  K 4.6 3.7 3.8  CL 88* 89* 93*  CO2 36* 39* 40*  GLUCOSE 134* 85 122*  BUN 26* 19 19  CREATININE 1.33* 1.19* 1.20*  CALCIUM 9.3 9.1 8.6*  PROT  --  6.3*  --   ALBUMIN  --  3.3*  --   AST  --  16  --   ALT  --  10  --   ALKPHOS  --  34*  --   BILITOT  --  0.6  --   GFRNONAA 40* 46* 46*  ANIONGAP '8 9 6     '$ Hematology Recent Labs  Lab 05/22/21 0733 05/23/21 0107  WBC 5.4 6.0  RBC 3.81* 3.61*  HGB 9.4* 8.9*  HCT 31.0* 28.3*  MCV 81.4 78.4*  MCH 24.7* 24.7*  MCHC 30.3 31.4  RDW 18.2* 18.2*  PLT 263 262    Cardiac EnzymesNo results for input(s): TROPONINI in the last 168 hours. No results for input(s): TROPIPOC in the last 168 hours.   BNP Recent Labs  Lab 05/22/21 0733  BNP 176.8*     DDimer No results for input(s): DDIMER in the last 168 hours.   Radiology    DG CHEST PORT 1 VIEW  Result Date: 05/23/2021 CLINICAL DATA:  Acute exacerbation of CHF. EXAM: PORTABLE CHEST 1 VIEW COMPARISON:  Chest radiograph dated 05/22/2021. FINDINGS: Cardiomegaly with vascular congestion and edema 10 similar to prior radiograph. Small bilateral pleural effusions and bibasilar atelectasis or infiltrate. No  pneumothorax. Atherosclerotic calcification of the aorta. No acute osseous pathology. IMPRESSION: 1. Cardiomegaly with findings of CHF, similar to prior radiograph. 2. Small bilateral pleural effusions and bibasilar atelectasis or infiltrate. Electronically Signed   By: Anner Crete M.D.   On: 05/23/2021 20:22    Cardiac Studies   TTE 05/08/2021 IMPRESSIONS   1. Left ventricular ejection fraction, by estimation, is 55 to 60%. The left ventricle has normal function. The left ventricle has no regional wall motion abnormalities. There is severe left ventricular hypertrophy.  Left ventricular diastolic parameters  are consistent with Grade II diastolic dysfunction (pseudonormalization).  Elevated left atrial pressure.   2. Right ventricular systolic function is moderately reduced. The right ventricular size is mildly enlarged. There is severely elevated pulmonary artery systolic pressure.   3. A small pericardial effusion is present. Moderate pleural effusion in the right lateral region.   4. The mitral valve is normal in structure. Mild mitral valve regurgitation. No evidence of mitral stenosis.   5. Tricuspid valve regurgitation is moderate.   6. The aortic valve is tricuspid. Aortic valve regurgitation is not visualized. No aortic stenosis is present.   7. The inferior vena cava is dilated in size with >50% respiratory variability, suggesting right atrial pressure of 8 mmHg.  Patient Profile     81 y.o. female chronic diastolic heart failure, C7EL, hypertension, hyperlipidemia, severe pulmonary hypertension, CKD stage IIIa, chronic respiratory failure on home O2, nonobstructive CAD, CVA, anterior mediastinal mass status post resection 09/2020, SBO  who is being seen  for the evaluation of diastolic heart failure and pulmonary hypertension.  Assessment & Plan    Acute on chronic diastolic heart failure with right heart failure-she still appears clinically to be volume overloaded I will continue  her diuretics at this time.  She has been weaned off BiPAP which is great.  She is on 2 L nasal cannula at home.  She is currently on 3 we will try to titrated to her baseline 2 L as we continue to diurese.  Pulmonary is also following.  For her pulmonary hypertension her Opsumit has been stopped she has been started on sildenafil 20 mg 3 times a day.  Acute on chronic respiratory failure-multifactorial especially in setting of acute on chronic diastolic heart failure with her severe pulmonary hypertension.  She is now off BiPAP and on nasal cannula.  Chronic kidney disease stage III-creatinine  1.2 seems to be her baseline.  Avoid nephrotoxins.  Was able to call her daughter and update her for the plan today.   For questions or updates, please contact [place] Please consult [email or web address] for contact info under Cardiology/STEMI.      Signed, Berniece Salines, DO  05/24/2021, 7:55 AM

## 2021-05-24 NOTE — Evaluation (Signed)
Occupational Therapy Evaluation Patient Details Name: Mandy [name] MRN: [id number] DOB: [birth year] Today's Date: 05/24/2021   History of Present Illness Pt is a 81 y.o. F who presents 05/22/2021 for evaluation of disatolic heart failure and pulmonary hypertension. 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   Clinical Impression   Patient admitted for above and presents with problem list below, including generalized weakness, decreased activity tolerance and impaired balance. She completed transfers and limited in room mobility using RW with min guard to supervision, LB dressing with min guard assist.  She fatigues easily and is on 2L O2 with VSS during session (HR up to 115 during ADLs).  Attempted use of interpreter, but not available during session. Will follow acutely to optimize independence and safety with ADLs, mobility and transfers. Recommend HHOT at dc.      Recommendations for follow up therapy are one component of a multi-disciplinary discharge planning process, led by the attending physician.  Recommendations may be updated based on patient status, additional functional criteria and insurance authorization.   Follow Up Recommendations  Home health OT    Assistance Recommended at Discharge Frequent or constant Supervision/Assistance  Patient can return home with the following A little help with walking and/or transfers;A little help with bathing/dressing/bathroom;Assistance with cooking/housework;Help with stairs or ramp for entrance;Assist for transportation    Functional Status Assessment  Patient has had a recent decline in their functional status and demonstrates the ability to make significant improvements in function in a reasonable and predictable amount of time.  Equipment Recommendations  None recommended by OT     Recommendations for Other Services       Precautions / Restrictions Precautions Precautions: Fall;Other (comment) Precaution Comments: watch O2 Restrictions Weight Bearing Restrictions: No      Mobility Bed Mobility Overal bed mobility: Modified Independent             General bed mobility comments: no assist required    Transfers                          Balance Overall balance assessment: Needs assistance Sitting-balance support: Feet supported, No upper extremity supported Sitting balance-Leahy Scale: Good     Standing balance support: Bilateral upper extremity supported, During functional activity Standing balance-Leahy Scale: Fair Standing balance comment: Reliant on Rw                           ADL either performed or assessed with clinical judgement   ADL Overall ADL's : Needs assistance/impaired     Grooming: Supervision/safety;Standing               Lower Body Dressing: Supervision/safety;Sit to/from stand   Toilet Transfer: Ambulation;Min guard;Rolling walker (2 wheels) Toilet Transfer Details (indicate cue type and reason): simulated in room         Functional mobility during ADLs: Min guard;Supervision/safety;Rolling walker (2 wheels)       Vision   Vision Assessment?: No apparent visual deficits     Perception     Praxis      Pertinent Vitals/Pain Pain Assessment Pain Assessment: Faces Pain Score: 0-No pain     Hand Dominance Right   Extremity/Trunk Assessment Upper Extremity Assessment Upper Extremity Assessment: LUE deficits/detail;Generalized weakness LUE Deficits / Details: pt reports hx of L  shoulder pain- baseline limited ROM   Lower Extremity Assessment Lower Extremity Assessment: Defer to PT evaluation   Cervical / Trunk Assessment Cervical / Trunk Assessment: Normal   Communication Communication Communication: Prefers language other than English Andria Meuse, no interpreter available)    Cognition Arousal/Alertness: Awake/alert Behavior During Therapy: WFL for tasks assessed/performed Overall Cognitive Status: Difficult to assess                                 General Comments: pt able to follow simple commands and gestures to engage in ADLs, but does not speak english and interpreter not available     General Comments  SpO2 stable on 2L    Exercises     Shoulder Instructions      Home Living Family/patient expects to be discharged to:: Private residence Living Arrangements: Children Available Help at Discharge: Family;Available 24 hours/day Type of Home: House Home Access: Level entry     Home Layout: One level     Bathroom Shower/Tub: Teacher, early years/pre: Standard     Home Equipment: Conservation officer, nature (2 wheels);BSC/3in1;Shower seat   Additional Comments: per chart review      Prior Functioning/Environment Prior Level of Function : Needs assist             Mobility Comments: using RW ADLs Comments: per previous admission patient has assist as needed from family for self care        OT Problem List: Decreased strength;Decreased activity tolerance;Impaired balance (sitting and/or standing);Decreased knowledge of use of DME or AE;Decreased knowledge of precautions;Impaired UE functional use      OT Treatment/Interventions: Self-care/ADL training;Therapeutic exercise;DME and/or AE instruction;Therapeutic activities;Patient/family education;Balance training;Energy conservation    OT Goals(Current goals can be found in the care plan section) Acute Rehab OT Goals Patient Stated Goal: none stated Time For Goal Achievement: 06/07/21 Potential to Achieve Goals: Good  OT Frequency: Min 2X/week    Co-evaluation              AM-PAC OT "6 Clicks" Daily Activity     Outcome Measure Help from another person eating meals?: A Little Help from another person taking care of personal grooming?: A Little Help from  another person toileting, which includes using toliet, bedpan, or urinal?: A Little Help from another person bathing (including washing, rinsing, drying)?: A Little Help from another person to put on and taking off regular upper body clothing?: A Little Help from another person to put on and taking off regular lower body clothing?: A Little 6 Click Score: 18   End of Session Equipment Utilized During Treatment: Rolling walker (2 wheels) Nurse Communication: Mobility status  Activity Tolerance: Patient tolerated treatment well Patient left: in bed;with call bell/phone within reach;with bed alarm set  OT Visit Diagnosis: Other abnormalities of gait and mobility (R26.89);Muscle weakness (generalized) (M62.81)                Time: 0165-5374 OT Time Calculation (min): 20 min Charges:  OT General Charges $OT Visit: 1 Visit OT Evaluation $OT Eval Moderate Complexity: 1 Mod  Jolaine Artist, OT Acute Rehabilitation Services Pager [phone] Office [phone]   Delight Stare 05/24/2021, 11:14 AM

## 2021-05-24 NOTE — Progress Notes (Signed)
Heart Failure Stewardship Pharmacist Progress Note   PCP: [name], MD PCP-Cardiologist: Donato Heinz, MD    HPI:  81 yo F with PMH of CHF, T2DM, HTN, HLD, PAH, CKD IIIa, chronic respiratory failure on home oxygen, CAD, CVA, hyperthyroidism, anterior mediastinal mass s/p resection, and SBO. She presented to the ED on 5/20 with shortness of breath and weight increase. Recent admission for respiratory failure from 5/5-5/10. She was treated for pneumonia and diuresed. Presented to cardiology clinic on 5/12 with persistent shortness of breath and weight gain. Torsemide was increased from 40 mg BID to 80 mg BID x 3-4 days  then trying to reduce to 80 mg / 40 mg and monitor weight and breathing.  CXR with low lung volumes, extensive bibasilar opacities, enlarged heart - most concerning for CHF. Her last ECHO was done 5/6 and LVEF 55-60% with G2DD, severe LVH, moderately reduced RV with severely elevated PAP.  Pulm/CCM consulted and Opsumit and tadalafil switched to sildenafil 20 mg TID with a concern she had adverse reaction to pulmonary vasodilator with worsening diastolic dysfunction.   Current HF Medications: Diuretic: furosemide 80 mg IV BID Beta Blocker: carvedilol 12.5 mg BID SGLT2i: Farxiga 10 mg daily Other: hydralazine 50 mg TID; sildenafil 20 mg TID; KCl 40 mEq x 1 then 20 mEq x 1  Prior to admission HF Medications: Diuretic: torsemide 40 mg BID Beta blocker: carvedilol 12.5 mg BID Other: hydralazine 50 mg TID; tadalafil 40 mg daily; macitentan 10 mg daily; KCl 20 mEq daily  Pertinent Lab Values: Serum creatinine 1.20, BUN 19, Potassium 3.8, Sodium 139, BNP 176.8, Magnesium 2.2, A1c 5.7 (12/2020)   Vital Signs: Weight: 149 lbs (admission weight: 158 lbs) Blood pressure: 140/60s  Heart rate: 90s  I/O: -2L yesterday; net -2.3L  Medication Assistance / Insurance Benefits Check: Does the patient have prescription insurance?  Yes Type of insurance plan: Medicare +  Medicaid  Outpatient Pharmacy:  Prior to admission outpatient pharmacy: [facility] Is the patient willing to use [place] at discharge? Yes Is the patient willing to transition their outpatient pharmacy to utilize a [facility] outpatient pharmacy?   Pending    Assessment: 1. Acute on chronic diastolic CHF (LVEF 16-10%). NYHA class III symptoms. - Continue furosemide 80 mg IV BID - good uop, weight down. Potassium replaced with 40 mEq x 1 then 20 mEq x 1 - Continue carvedilol 12.5 mg BID - Consider adding Entresto prior to discharge pending BP and SCr trends - Consider adding spironolactone 12.5 mg daily  - Agree with adding Farxiga 10 mg daily - Continue hydralazine 50 mg TID - Continue sildenafil 20 mg TID   Plan: 1) Medication changes recommended at this time: - Add spironolactone 12.5 mg daily  2) Patient assistance: - Delene Loll / Wilder Glade copay $0  3)  Education  - To be completed prior to discharge  Kerby Nora, PharmD, BCPS Heart Failure Stewardship Pharmacist Phone [phone]

## 2021-05-24 NOTE — Progress Notes (Signed)
Physical Therapy Treatment Patient Details Name: Mandy [name] MRN: [id number] DOB: 03-Jul-1940 Today's Date: 05/24/2021   History of Present Illness Pt is a 81 y.o. F who presents 05/22/2021 for evaluation of disatolic heart failure and pulmonary hypertension. 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    PT Comments    Pt received in recliner, daughter present and assisting with translation, pt agreeable to therapy session for gait progression and transfer training. Pt with improved activity tolerance in afternoon, progressed gait distance to ~47f with single standing break and RW support. SpO2 WFL on 2L O2 [place]. RN notified purewick not catching all urine output as her chair was quite wet upon pt standing up. Plan to progress standing tolerance/initiate HEP instruction next session as tolerated. Pt continues to benefit from PT services to progress toward functional mobility goals.   Recommendations for follow up therapy are one component of a multi-disciplinary discharge planning process, led by the attending physician.  Recommendations may be updated based on patient status, additional functional criteria and insurance authorization.  Follow Up Recommendations  Home health PT     Assistance Recommended at Discharge Frequent or constant Supervision/Assistance  Patient can return home with the following A little help with walking and/or transfers;A little help with bathing/dressing/bathroom;Assistance with cooking/housework;Direct supervision/assist for medications management;Direct supervision/assist for financial management;Assist for transportation;Help with stairs or ramp for entrance   Equipment Recommendations  None recommended by PT    Recommendations for Other Services       Precautions / Restrictions  Precautions Precautions: Fall;Other (comment) Precaution Comments: watch O2 Restrictions Weight Bearing Restrictions: No     Mobility  Bed Mobility                    Transfers Overall transfer level: Needs assistance Equipment used: Rolling walker (2 wheels) Transfers: Sit to/from Stand Sit to Stand: Supervision           General transfer comment: cues for safe UE placement each transfer (from recliner, to EOB, then EOB>recliner)    Ambulation/Gait Ambulation/Gait assistance: Min guard Gait Distance (Feet): 70 Feet (including standing break) Assistive device: Rolling walker (2 wheels) Gait Pattern/deviations: Step-through pattern, Decreased stride length, Wide base of support       General Gait Details: min guard for safe RW mgmt, no LOB; SpO2 WFL on 2L O2 [place]   Stairs             Wheelchair Mobility    Modified Rankin (Stroke Patients Only)       Balance Overall balance assessment: Needs assistance Sitting-balance support: Feet supported, No upper extremity supported Sitting balance-Leahy Scale: Good     Standing balance support: Bilateral upper extremity supported, During functional activity Standing balance-Leahy Scale: Fair Standing balance comment: able to stand with U UE support for static task but BUE support needed for gait                            Cognition Arousal/Alertness: Awake/alert Behavior During Therapy: WFL for tasks assessed/performed Overall Cognitive Status: Difficult to assess                                 General Comments: pt able to follow simple commands and gestures to engage in  functional mobility tasks, does more with daughter [name] present for encouragement/translation and to walk further; urinary incontinence vs purewick malfunction, NT/RN notified.        Exercises      General Comments General comments (skin integrity, edema, etc.): SpO2 stable on 2L O2 [place], HR 91-100  bpm      Pertinent Vitals/Pain Pain Assessment Pain Assessment: No/denies pain     PT Goals (current goals can now be found in the care plan section) Acute Rehab PT Goals Patient Stated Goal: to go home PT Goal Formulation: With patient Time For Goal Achievement: 06/06/21 Progress towards PT goals: Progressing toward goals    Frequency    Min 3X/week      PT Plan Current plan remains appropriate       AM-PAC PT "6 Clicks" Mobility   Outcome Measure  Help needed turning from your back to your side while in a flat bed without using bedrails?: None Help needed moving from lying on your back to sitting on the side of a flat bed without using bedrails?: None Help needed moving to and from a bed to a chair (including a wheelchair)?: A Little Help needed standing up from a chair using your arms (e.g., wheelchair or bedside chair)?: A Little Help needed to walk in hospital room?: A Little Help needed climbing 3-5 steps with a railing? : A Lot 6 Click Score: 19    End of Session Equipment Utilized During Treatment: Gait belt;Oxygen Activity Tolerance: Patient tolerated treatment well Patient left: in chair;with call bell/phone within reach;with chair alarm set;with family/visitor present (daughter present) Nurse Communication: Mobility status;Other (comment) (pt needs new purewick and maybe a bath) PT Visit Diagnosis: Unsteadiness on feet (R26.81);Muscle weakness (generalized) (M62.81);Difficulty in walking, not elsewhere classified (R26.2)     Time: 0867-6195 PT Time Calculation (min) (ACUTE ONLY): 23 min  Charges:  $Gait Training: 8-22 mins $Therapeutic Activity: 8-22 mins                     Curry Seefeldt P., PTA Acute Rehabilitation Services Secure Chat Preferred 9a-5:30pm Office: [place] 05/24/2021, 6:18 PM

## 2021-05-24 NOTE — Progress Notes (Signed)
Mobility Specialist Progress Note    05/24/21 1403  Mobility  Activity Ambulated with assistance in hallway  Level of Assistance Contact guard assist, steadying assist  Assistive Device Front wheel walker  Distance Ambulated (ft) 45 ft  Activity Response Tolerated fair  $Mobility charge 1 Mobility   Pre-Mobility: 89 HR, 100% on 2LO2 SpO2 Post-Mobility: 86 HR, 98% on 1LO2 SpO2  Pt received in bed and agreeable. C/o feeling dizzy and desatting on 2LO2. Recovered into 90s with a seated rest break and encouraged pursed lip breathing. Rolled back in recliner to room. Left in chair with call bell in reach and RN moved to [place].   Hildred Alamin Mobility Specialist  Primary: 5N M.S. Phone: [phone] Secondary: 6N M.S. Phone: [phone]

## 2021-05-24 NOTE — Progress Notes (Signed)
Progress Note   Patient: Mandy [name] YHC:[id number] DOB: Nov 09, 1940 DOA: 05/22/2021     2 DOS: the patient was seen and examined on 05/24/2021   Brief hospital course: Mandy [name] was admitted to the hospital with the working diagnosis of decompensated heart failure.  81 yo female with the past medical history of coronary artery disease, type 2 DM, hypertension and severe pulmonary hypertension who presented with dyspnea. Recent hospitalization 05/05 to 05/12/21 for decompensated heart failure, pulmonary hypertension. Not able to get home Cpap/bipap. At home she had recurrence of worsening dyspnea along with weight gain. As outpatient her diuretic therapy was increased, but unfortunately her symptoms continue to worsen prompting her to come back to the hospital. On her initial physical examination patient was in respiratory distress and was placed on non invasive mechanical ventilation. Her blood pressure was 188/61, HR 83, RR 20 and 02 saturation 95%, lungs with no wheezing or rhonchi, heart with S1 and S2 present and rhythmic, abdomen not distended, no  lower extremity edema.    Na 132, K 4,6, Cl 88, bicarbonate at 36, glucose 134, bun 26 cr 1,33 High sensitive troponin 86 and 96  Wbc 5,4 hgb 9,4 plt 263   Chest radiograph with cardiomegaly, bilateral interstitial infiltrates predominant lower lobes and right upper lobe, small bilateral pleural effusions. Hypoinflation.   EKG 98 bpm, normal axis, normal intervals, sinus rhythm with no significant ST segment or T wave changes.   Patient has been placed on furosemide for diuresis with improvement in oxygenation. Liberated from non invasive mechanical ventilation and transitioned to high flow nasal cannula.   Assessment and Plan: * Acute on chronic diastolic CHF (congestive heart failure) (HCC) Echocardiogram with LV systolic function preserved 55 to 60%, with severe left ventricular hypertrophy. Moderate reduction in RV systolic function,  severely elevated pulmonary artery systolic pressure. Small pericardial effusion. Moderate tricuspid valve regurgitation.   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   Urine output over last 24 hrs is 517 ml Systolic blood pressure is 118to 130 mmHg.  Plan to continue diuresis with furosemide 80 mg IV q12 hrs On carvedilol, hydralazine and empagliflozin  Continue sildenafil for pulmonary hypertension.   Acute on chronic hypoxemic respiratory failure due to cardiogenic pulmonary edema,.  Patient has responded to diuresis well, now liberated from non invasive mechanical ventilation.   Today her oxygenation is 100% on 3 L/min per [place]. Dc Bipap.      AKI (acute kidney injury) ([place]) CKD stage 3a, Hypomagnsemia. Metabolic alkalosis   Improved volume status, renal function with serum cr at 1,20 with K at 3,8 and serum bicarbonate at 40.   Add 40 meq Kcl today and follow up renal function in am. Continue diuresis with IV furosemide, avoid hypotension and nephrotoxic medications.   Essential hypertension Continue blood pressure control with carvedilol and hydralazine.   Dyslipidemia Continue with statin therapy.   Hyperthyroidism Continue with methimazole.   Class 1 obesity Calculated BMI is 32,0         Subjective: Patient with improved in dyspnea, today with no chest pain, continue to be very weak and deconditioned   Physical Exam: Vitals:   05/24/21 0028 05/24/21 0500 05/24/21 0751 05/24/21 1126  BP: (!) 118/54  130/60 (!) 147/67  Pulse: 87  77 87  Resp: (!) 23  18 (!) 23  Temp: 98.6 F (37 C)  98.4 F (36.9 C) 98.4 F (36.9 C)  TempSrc: Oral  Oral Oral  SpO2: 100%  100%  98%  Weight: 68 kg 68 kg    Height:       Neurology awake an alert ENT with mild pallor Cardiovascular with S1 and S2 present and rhythmic. Systolic murmur at the right sternal border.  Positive JVD Respiratory with scattered rales with no rhonchi or wheezing   Abdomen with no distention No lower extremity edema  Data Reviewed:    Family Communication: no family at the bedside   Disposition: Status is: Inpatient Remains inpatient appropriate because: heart failure on IV furosemide   Planned Discharge Destination: Home    Author: Tawni Millers, MD 05/24/2021 3:26 PM  For on call review [email or web address].

## 2021-05-24 NOTE — Plan of Care (Signed)
  Problem: Activity: Goal: Capacity to carry out activities will improve Outcome: Progressing   Problem: Cardiac: Goal: Ability to achieve and maintain adequate cardiopulmonary perfusion will improve Outcome: Progressing   

## 2021-05-24 NOTE — Plan of Care (Signed)
  Problem: Education: Goal: Knowledge of General Education information will improve Description: Including pain rating scale, medication(s)/side effects and non-pharmacologic comfort measures Outcome: Progressing   Problem: Health Behavior/Discharge Planning: Goal: Ability to manage health-related needs will improve Outcome: Progressing   Problem: Clinical Measurements: Goal: Ability to maintain clinical measurements within normal limits will improve Outcome: Progressing Goal: Diagnostic test results will improve Outcome: Progressing Goal: Respiratory complications will improve Outcome: Progressing   Problem: Nutrition: Goal: Adequate nutrition will be maintained Outcome: Progressing   Problem: Pain Managment: Goal: General experience of comfort will improve Outcome: Progressing   Problem: Safety: Goal: Ability to remain free from injury will improve Outcome: Progressing

## 2021-05-24 NOTE — Progress Notes (Signed)
Patient ID: Mandy [name], female   DOB: August 09, 1940, 81 y.o.   MRN: [id number]    Progress Note from the Palliative Medicine Team at [facility]   Patient Name: Mandy [name]        Date: 05/24/2021 DOB: 28-Apr-1940  Age: 81 y.o. MRN#: [id number] Attending Physician: Tawni Millers Primary Care Physician: [name], MD Admit Date: 05/22/2021   Medical records reviewed, discussed  with treatment team  81 y.o. female with past history of chronic diastolic congestive heart failure, diabetes, anterior mediastinal mass status post resection September 2020, thyroid disease, recurrent pleural effusions, severe pulmonary hypertension, chronic kidney disease, nonobstructive coronary disease, cryptogenic stroke admitted on 05/22/2021 with acute on chronic hypoxemic respiratory failure secondary to pulmonary hypertension with volume overload.  This NP visited patient at the bedside as a follow up for palliative medicine needs and emotional support. Patient is alert, I had difficulty communicating with 2/2 to language barrier. She asked me to call her daughter.   Spoke to [employer] by telephone.  Education offered regarding current medical situation and likely trajectory of acute on chronic heart failure and the importance of continued conversation regarding overall plan of care and treatment options,  ensuring decisions are within the context of the patients values and GOCs.  Education offered on outpatient community-based palliative care services.  Questions and concerns addressed   Discussed with bedside RN   PMT will continue to support holistically   Wadie Lessen NP  Palliative Medicine Team Team Phone # [phone] Pager [phone]

## 2021-05-24 NOTE — Progress Notes (Signed)
Initial Nutrition Assessment  DOCUMENTATION CODES:   Not applicable  INTERVENTION:   - Liberalize diet to 2 gram sodium with 1500 ml fluid restriction to provide more food options  - Ensure Enlive po BID, each supplement provides 350 kcal and 20 grams of protein  - Renal MVI daily  - Encourage PO intake  NUTRITION DIAGNOSIS:   Increased nutrient needs related to chronic illness (CHF) as evidenced by estimated needs.  GOAL:   Patient will meet greater than or equal to 90% of their needs  MONITOR:   PO intake, Supplement acceptance, Labs, Weight trends, I & O's  REASON FOR ASSESSMENT:   Consult Other (nutritional goals)  ASSESSMENT:   81 year old female who presented to the ED on 5/20 with SOB and malaise. PMH of CHF, CAD, CKD stage IIIa, DM, HTN, HLD, severe pulmonary HTN, GERD. Pt admitted with acute on chronic respiratory failure with hypoxia due to decompensated cor pulmonale, pulmonary HTN, pleural effusions, and CHF.  Pt unavailable at time of RD visit x 2. Will attempt to obtain diet and weight history at follow-up.  Reviewed available weight history in chart. Pt with a fairly consistent decline in weight over the last year. Overall, pt has lost 12 kg since 07/27/20. This is a 15% weight loss in 10 months which is not quite clinically significant for timeframe but is concerning given progressive nature of weight loss. Pt is at risk for malnutrition and may already meet criteria but RD unable to confirm without NFPE.  RD to order oral nutrition supplements to aid pt in meeting increased kcal and protein needs. Will also order daily renal MVI and liberalize diet to 2 gram sodium to provide more food options.  Admit weight: 72 kg Current weight: 68 kg  Pt with mild pitting generalized edema and mild pitting edema to BLE.  Medications reviewed and include: farxiga, colace, IV lasix, melatonin, protonix  Labs reviewed: creatinine 1.20, hemoglobin 8.9  I/O's: -2.3 L  since admit  NUTRITION - FOCUSED PHYSICAL EXAM:  Unable to complete at this time. Pt unavailable.  Diet Order:   Diet Order             Diet 2 gram sodium Room service appropriate? Yes; Fluid consistency: Thin; Fluid restriction: 1500 mL Fluid  Diet effective now                   EDUCATION NEEDS:   Not appropriate for education at this time  Skin:  Skin Assessment: Reviewed RN Assessment  Last BM:  05/23/21 small type 2  Height:   Ht Readings from Last 1 Encounters:  05/22/21 '4\' 11"'$  (1.499 m)    Weight:   Wt Readings from Last 1 Encounters:  05/24/21 68 kg    BMI:  Body mass index is 30.28 kg/m.  Estimated Nutritional Needs:   Kcal:  1700-1900  Protein:  80-95 grams  Fluid:  1.8 L    Gustavus Bryant, MS, RD, LDN Inpatient Clinical Dietitian Please see AMiON for contact information.

## 2021-05-25 ENCOUNTER — Other Ambulatory Visit (HOSPITAL_COMMUNITY): Payer: Self-pay

## 2021-05-25 DIAGNOSIS — I5033 Acute on chronic diastolic (congestive) heart failure: Secondary | ICD-10-CM | POA: Diagnosis not present

## 2021-05-25 DIAGNOSIS — E785 Hyperlipidemia, unspecified: Secondary | ICD-10-CM | POA: Diagnosis not present

## 2021-05-25 DIAGNOSIS — N179 Acute kidney failure, unspecified: Secondary | ICD-10-CM | POA: Diagnosis not present

## 2021-05-25 DIAGNOSIS — I10 Essential (primary) hypertension: Secondary | ICD-10-CM | POA: Diagnosis not present

## 2021-05-25 LAB — BLOOD GAS, ARTERIAL
Acid-Base Excess: 13.5 mmol/L — ABNORMAL HIGH (ref 0.0–2.0)
Bicarbonate: 41.2 mmol/L — ABNORMAL HIGH (ref 20.0–28.0)
Drawn by: 28099
O2 Saturation: 97.6 %
Patient temperature: 37.1
pCO2 arterial: 65 mmHg — ABNORMAL HIGH (ref 32–48)
pH, Arterial: 7.41 (ref 7.35–7.45)
pO2, Arterial: 113 mmHg — ABNORMAL HIGH (ref 83–108)

## 2021-05-25 LAB — BASIC METABOLIC PANEL
Anion gap: 9 (ref 5–15)
BUN: 22 mg/dL (ref 8–23)
CO2: 36 mmol/L — ABNORMAL HIGH (ref 22–32)
Calcium: 8.7 mg/dL — ABNORMAL LOW (ref 8.9–10.3)
Chloride: 93 mmol/L — ABNORMAL LOW (ref 98–111)
Creatinine, Ser: 1.31 mg/dL — ABNORMAL HIGH (ref 0.44–1.00)
GFR, Estimated: 41 mL/min — ABNORMAL LOW (ref >60)
Glucose, Bld: 95 mg/dL (ref 70–99)
Potassium: 4.1 mmol/L (ref 3.5–5.1)
Sodium: 138 mmol/L (ref 135–145)

## 2021-05-25 MED ORDER — SILDENAFIL CITRATE 20 MG PO TABS
20.0000 mg | ORAL_TABLET | Freq: Three times a day (TID) | ORAL | 0 refills | Status: DC
  Filled 2021-05-25: qty 10, 4d supply, fill #0

## 2021-05-25 MED ORDER — RENA-VITE PO TABS: 1.0000 | ORAL_TABLET | Freq: Every day | ORAL | 0 refills | Status: AC

## 2021-05-25 MED ORDER — DAPAGLIFLOZIN PROPANEDIOL 10 MG PO TABS
10.0000 mg | ORAL_TABLET | Freq: Every day | ORAL | 0 refills | Status: AC
Start: 2021-05-25 — End: 2021-06-24
  Filled 2021-05-25: qty 30, 30d supply, fill #0

## 2021-05-25 MED ORDER — ENSURE ENLIVE PO LIQD
237.0000 mL | Freq: Two times a day (BID) | ORAL | 0 refills | Status: AC
  Filled 2021-05-25: qty 14220, 30d supply, fill #0

## 2021-05-25 NOTE — TOC Initial Note (Signed)
Transition of Care [facility]) - Initial/Assessment Note    Patient Details  Name: Mandy [name] MRN: [id number] Date of Birth: 1940/01/16  Transition of Care [facility]) CM/SW Contact:    Angelita Ingles, RN Phone Number:[phone]  05/25/2021, 10:11 AM  Clinical Narrative:                 CM received message from MD to inquire if home bipap/cpap has been set up. CM made MD aware that CM was not aware of that referral. MD states that this request was from a previous admission. CM researching to determine if this has been previously set up. Will update note.         Patient Goals and CMS Choice        Expected Discharge Plan and Services           Expected Discharge Date: 05/25/21                                    Prior Living Arrangements/Services                       Activities of Daily Living      Permission Sought/Granted                  Emotional Assessment              Admission diagnosis:  Acute on chronic diastolic CHF (congestive heart failure) ([place]) [I50.33] Patient Active Problem List   Diagnosis Date Noted   Class 1 obesity 05/23/2021   Acute on chronic diastolic CHF (congestive heart failure) ([place]) 05/22/2021   Elevated troponin 05/08/2021   DOE (dyspnea on exertion) 05/07/2021   History of stroke 05/07/2021   Pressure injury of skin 12/16/2020   CHF exacerbation ([place]) 12/14/2020   Cerebral thrombosis with cerebral infarction 12/07/2020   CVA (cerebral vascular accident) ([place]) 12/06/2020   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[place]) 12/03/2020   Chest pain of uncertain etiology    Chronic respiratory failure with hypoxia, on home oxygen therapy ([place]) 11/24/2020   Anxiety 11/24/2020   Acute on chronic heart failure with preserved ejection fraction (HFpEF) ([place]) 11/23/2020   Hypoxia 11/03/2020   Recurrent right pleural effusion 10/01/2020   Hyperkalemia 10/01/2020   AKI (acute kidney injury) ([place]) 10/01/2020   Anemia 10/01/2020    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   Acute respiratory failure with hypoxia ([place]) 06/26/2020   Hypertensive urgency 06/26/2020   Generalized weakness 06/26/2020   Dizziness 06/26/2020   Obesity (BMI 30-39.9) 06/26/2020   Hyperthyroidism 06/26/2020   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 [place] [address] Phone: [phone] Fax: [phone]     Social Determinants of Health (SDOH) Interventions    Readmission Risk Interventions    05/11/2021    4:46 PM 02/05/2021    9:57 AM 11/06/2020    9:50 AM  Readmission Risk Prevention Plan  Transportation Screening Complete Complete Complete  PCP or Specialist Appt within 3-5 Days   Complete  HRI or [place]   Complete  Social Work Consult for [place] Planning/Counseling   Complete  Palliative Care Screening   Complete  Medication Review Press photographer) Complete Complete Complete  PCP or Specialist appointment within 3-5 days of discharge Complete Complete   HRI or Home Care Consult Complete Complete   SW Recovery Care/Counseling Consult Complete Complete   Palliative Care Screening Not Applicable Complete   [place] Not Applicable Not Applicable

## 2021-05-25 NOTE — TOC Transition Note (Signed)
Transition of Care [facility]) - CM/SW Discharge Note   Patient Details  Name: Mandy [name] MRN: [id number] Date of Birth: 09/08/1940  [facility]) CM/SW Contact:  Angelita Ingles, RN Phone [phone]  05/25/2021, 3:45 PM   Clinical Narrative:    Order for CPAP has been signed and emailed back to [facility] with [facility]. Per Brenton Grills he has everything to submit for insurance authorization. Authorization will not be complete today. Per [employer] patient can discharge home and CPAP will be delivered to the home as soon as insurance authorization is complete. Nurse and MD have been updated.     Final next level of care: [place] Barriers to Discharge: No Barriers Identified   Patient Goals and CMS Choice Patient states their goals for this hospitalization and ongoing recovery are:: spoke with daughter CMS Medicare.gov Compare Post Acute Care list provided to:: Patient Represenative (must comment) (daughter) Choice offered to / list presented to : Adult Children  Discharge Placement                       Discharge Plan and Services In-house Referral: NA Discharge Planning Services: CM Consult            DME Arranged: N/A         HH Arranged: PT (to resume services) [place]: [facility] (Patient is currently active with Alvis Lemmings) Date [facility] Agency Contacted: 05/25/21 Time [place]: 1215 Representative spoke with at [place]: [place] ([place]) Interventions     Readmission Risk Interventions    05/25/2021   12:10 PM 05/11/2021    4:46 PM 02/05/2021    9:57 AM  Readmission Risk Prevention Plan  Transportation Screening Complete Complete Complete  Medication Review Press photographer) Complete Complete Complete  PCP or Specialist appointment within 3-5 days of discharge Complete Complete Complete  HRI or Home Care Consult Complete Complete Complete  SW Recovery Care/Counseling Consult  Complete Complete Complete  Palliative Care Screening Not Applicable Not Applicable Complete  [place] Not Applicable Not Applicable Not Applicable

## 2021-05-25 NOTE — Progress Notes (Signed)
Heart Failure Stewardship Pharmacist Progress Note   PCP: [name], MD PCP-Cardiologist: Donato Heinz, MD    HPI:  81 yo F with PMH of CHF, T2DM, HTN, HLD, PAH, CKD IIIa, chronic respiratory failure on home oxygen, CAD, CVA, hyperthyroidism, anterior mediastinal mass s/p resection, and SBO.  She presented to the ED on 05/20 with shortness of breath and weight increase.  Recent admission for respiratory failure from 05/05-05/10.  She was treated for pneumonia and diuresed.  Presented to cardiology clinic on 05/12 with persistent shortness of breath and weight gain. Torsemide was increased from 40 mg BID to 80 mg BID x 3-4 days then trying to reduce to 80 mg / 40 mg and monitor weight and breathing.  CXR with low lung volumes, extensive bibasilar opacities, enlarged heart - most concerning for CHF. Her last ECHO was done 05/06 and LVEF 55-60% with G2DD, severe LVH, moderately reduced RV with severely elevated PAP.  LVEF this admission reduced from 60-65% in 02/2021.   Pulm/CCM consulted and Opsumit and tadalafil switched to sildenafil 20 mg TID with a concern she had adverse reaction to pulmonary vasodilator with worsening diastolic dysfunction.   Current HF Medications: Diuretic: furosemide 80 mg IV BID Beta Blocker: carvedilol 12.5 mg BID SGLT2i: Farxiga 10 mg daily Other: hydralazine 50 mg TID; sildenafil 20 mg TID  Prior to admission HF Medications: Diuretic: torsemide 40 mg BID Beta blocker: carvedilol 12.5 mg BID Other: hydralazine 50 mg TID; tadalafil 40 mg daily; macitentan 10 mg daily; KCl 20 mEq daily  Pertinent Lab Values: Serum creatinine 1.31 (up), BUN 22, Potassium 4.1, Sodium 138, BNP 176.8, Magnesium 2.2, A1c 5.7 (12/2020)   Vital Signs: Weight: 148 lbs (admission weight: 158 lbs) Blood pressure: 140-50s/60-70 Heart rate: 80-90s  I/O: none charted yesterday; net -1.9L  Medication Assistance / Insurance Benefits Check: Does the patient have prescription  insurance?  Yes Type of insurance plan: Medicare + Medicaid  Outpatient Pharmacy:  Prior to admission outpatient pharmacy: [facility] Is the patient willing to use [facility] at discharge? Yes Is the patient willing to transition their outpatient pharmacy to utilize a [facility] outpatient pharmacy?   Pending    Assessment: 1. Acute on chronic diastolic CHF with right HF (LVEF 55-60%). NYHA class III symptoms. - Continue torsemide 40 mg BID - no uop charted yesterday but down 1 lb today and back to baseline [place] requirements of 2L. - Continue carvedilol 12.5 mg BID - Consider adding Entresto prior to discharge pending BP and SCr trends - Consider adding spironolactone 12.5 mg daily  - Continue Farxiga 10 mg daily - Continue hydralazine 50 mg TID - Continue sildenafil 20 mg TID   Plan: 1) Medication changes recommended at this time: - Add spironolactone 12.5 mg daily - if not, can add at 06/05 [facility] visit  2) Patient assistance: - Delene Loll / Wilder Glade copay $0  3)  Education  - Patient has been educated on current HF medications and potential additions to HF medication regimen - Patient verbalizes understanding that over the next few months, these medication doses may change and more medications may be added to optimize HF regimen - Patient has been educated on basic disease state pathophysiology and goals of therapy  Laurey Arrow, PharmD PGY1 Pharmacy Resident 05/25/2021  8:18 AM

## 2021-05-25 NOTE — Progress Notes (Signed)
.   Mandy [name] is an 81 y/o female with Chronic respiratory hypercarbia Failure and CHF and Obesity Hyperventilation Syndrome . Patient presented to ED with complaints of sob and respiratory failure. Upon admission (05/22/2021), patient was placed on Bipap ST/Avaps  with a continued failed ABG due to hypercapnia with a PCO2=65.0 . Due to patient's condition, she is at risk of worsening of Respiratory failure and Obesity Hyperventilation Syndrome. Patient has had numerous readmissions in the last 2 to three months with issues involving respiratory failure. Bipap ST/Avaps has been tried and failed considered and ruled out, however patient will require volume ventilation to promote gas exchange via NIV therapy. Patient also requires mouthpiece ventilation for daytime as the pco2 elevates during the day. Due to the severity of the patient and the need for daytime mouthpiece ventilation, back up battery, alarms and portability an NIV is required which is not supported by the bipap/rad/bipap avaps device." Patient is able to keep airway clear and clear secretions.

## 2021-05-25 NOTE — Progress Notes (Signed)
Progress Note  Patient Name: Mandy [name] Date of Encounter: 05/25/2021  Primary Cardiologist: Donato Heinz, MD   Subjective   Patient seen and examined at her bedside.  She is awake when I arrived.  I was able to communicate with the patient as I do speak [place] therefore no interpreter was needed.  Patient was eating her breakfast.  She offers no complaints at this time.  She denies any chest pain or shortness of breath.  Still on nasal cannula 3 L-however she is on oxygen at home.  Inpatient Medications    Scheduled Meds:  atorvastatin  80 mg Oral QPM   carvedilol  12.5 mg Oral BID WC   clopidogrel  75 mg Oral Daily   dapagliflozin propanediol  10 mg Oral Daily   docusate sodium  100 mg Oral BID   enoxaparin (LOVENOX) injection  40 mg Subcutaneous Q24H   feeding supplement  237 mL Oral BID BM   furosemide  80 mg Intravenous BID   gabapentin  100 mg Oral QHS   hydrALAZINE  50 mg Oral TID   loratadine  10 mg Oral Daily   melatonin  10 mg Oral QHS   methimazole  2.5 mg Oral Daily    morphine injection  2 mg Intravenous Once   multivitamin  1 tablet Oral QHS   pantoprazole  40 mg Oral q morning   potassium chloride  20 mEq Oral Once   sildenafil  20 mg Oral TID   sodium chloride flush  3 mL Intravenous Q12H   Continuous Infusions:  PRN Meds: acetaminophen **OR** acetaminophen, bisacodyl, guaiFENesin, hydrALAZINE, ipratropium-albuterol, morphine injection, ondansetron **OR** ondansetron (ZOFRAN) IV, oxyCODONE, polyethylene glycol, traZODone   Vital Signs    Vitals:   05/24/21 2300 05/24/21 2357 05/25/21 0406 05/25/21 0751  BP:  (!) 149/70 (!) 155/64 (!) 155/78  Pulse: 84 84 82 90  Resp: '20 20 20 16  '$ Temp:  98.6 F (37 C) 97.6 F (36.4 C)   TempSrc:  Oral Oral   SpO2: 100% 100% 99% 100%  Weight:   67.5 kg   Height:        Intake/Output Summary (Last 24 hours) at 05/25/2021 7408 Last data filed at 05/24/2021 2000 Gross per 24 hour  Intake 240 ml   Output --  Net 240 ml   Filed Weights   05/24/21 0028 05/24/21 0500 05/25/21 0406  Weight: 68 kg 68 kg 67.5 kg    Telemetry    Sinus rhythm with rare PVCs- Personally Reviewed  ECG    None- Personally Reviewed  Physical Exam    General: Comfortable, lying in bed when I arrived awake Head: Atraumatic, normal size  Eyes: PEERLA, EOMI  Neck: Supple, normal JVD Cardiac: Normal S1, S2; RRR; no murmurs, rubs, or gallops Lungs: Clear to auscultation bilaterally Abd: Soft, nontender, no hepatomegaly  Ext: warm, trace bilateral lower extremity edema Musculoskeletal: No deformities, BUE and BLE strength normal and equal Skin: Warm and dry, no rashes   Neuro: Alert and oriented to person, place, time, and situation, CNII-XII grossly intact, no focal deficits  Psych: Normal mood and affect   Labs    Chemistry Recent Labs  Lab 05/23/21 0107 05/24/21 0053 05/25/21 0113  NA 137 139 138  K 3.7 3.8 4.1  CL 89* 93* 93*  CO2 39* 40* 36*  GLUCOSE 85 122* 95  BUN '19 19 22  '$ CREATININE 1.19* 1.20* 1.31*  CALCIUM 9.1 8.6* 8.7*  PROT 6.3*  --   --  ALBUMIN 3.3*  --   --   AST 16  --   --   ALT 10  --   --   ALKPHOS 34*  --   --   BILITOT 0.6  --   --   GFRNONAA 46* 46* 41*  ANIONGAP '9 6 9     '$ Hematology Recent Labs  Lab 05/22/21 0733 05/23/21 0107  WBC 5.4 6.0  RBC 3.81* 3.61*  HGB 9.4* 8.9*  HCT 31.0* 28.3*  MCV 81.4 78.4*  MCH 24.7* 24.7*  MCHC 30.3 31.4  RDW 18.2* 18.2*  PLT 263 262    Cardiac EnzymesNo results for input(s): TROPONINI in the last 168 hours. No results for input(s): TROPIPOC in the last 168 hours.   BNP Recent Labs  Lab 05/22/21 0733  BNP 176.8*     DDimer No results for input(s): DDIMER in the last 168 hours.   Radiology    DG CHEST PORT 1 VIEW  Result Date: 05/23/2021 CLINICAL DATA:  Acute exacerbation of CHF. EXAM: PORTABLE CHEST 1 VIEW COMPARISON:  Chest radiograph dated 05/22/2021. FINDINGS: Cardiomegaly with vascular  congestion and edema 10 similar to prior radiograph. Small bilateral pleural effusions and bibasilar atelectasis or infiltrate. No pneumothorax. Atherosclerotic calcification of the aorta. No acute osseous pathology. IMPRESSION: 1. Cardiomegaly with findings of CHF, similar to prior radiograph. 2. Small bilateral pleural effusions and bibasilar atelectasis or infiltrate. Electronically Signed   By: Anner Crete M.D.   On: 05/23/2021 20:22    Cardiac Studies   TTE 05/08/2021 IMPRESSIONS   1. Left ventricular ejection fraction, by estimation, is 55 to 60%. The left ventricle has normal function. The left ventricle has no regional wall motion abnormalities. There is severe left ventricular hypertrophy.  Left ventricular diastolic parameters  are consistent with Grade II diastolic dysfunction (pseudonormalization).  Elevated left atrial pressure.   2. Right ventricular systolic function is moderately reduced. The right ventricular size is mildly enlarged. There is severely elevated pulmonary artery systolic pressure.   3. A small pericardial effusion is present. Moderate pleural effusion in the right lateral region.   4. The mitral valve is normal in structure. Mild mitral valve regurgitation. No evidence of mitral stenosis.   5. Tricuspid valve regurgitation is moderate.   6. The aortic valve is tricuspid. Aortic valve regurgitation is not visualized. No aortic stenosis is present.   7. The inferior vena cava is dilated in size with >50% respiratory variability, suggesting right atrial pressure of 8 mmHg.  Patient Profile     81 y.o. 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  Assessment & Plan    Acute  on chronic diastolic heart failure with right heart failure-has recovered and appears to be euvolemic.  Suggest transitioning from IV Lasix to p.o. diuretics she takes torsemide at home: Torsemide 40 mg twice a day.  Back to baseline nasal cannula.  Pulmonary is also following.  For her pulmonary hypertension her Opsumit has been stopped she has been started on sildenafil 20 mg 3 times a day.  Acute on chronic respiratory failure-multifactorial especially in setting of acute on chronic diastolic heart failure with her severe pulmonary hypertension.  She is now off BiPAP and on nasal cannula.  Chronic kidney disease stage III-creatinine 1.2 seems  to be her baseline.  Avoid nephrotoxins.  Plan discussed with the patient, her bedside nurse, and the hospitalist.  For questions or updates, please contact [place] Please consult [email or web address] for contact info under Cardiology/STEMI.      Signed, Lashena Signer, DO  05/25/2021, 8:22 AM

## 2021-05-25 NOTE — Progress Notes (Incomplete)
Heart Failure Nurse Navigator Progress Note  PCP: [name], MD PCP-Cardiologist: *** Admission Diagnosis: *** Admitted from: ***  Presentation:   [name] presented with ***  ECHO/ LVEF: ***  Clinical Course:  Past Medical History:  Diagnosis Date   Aortic atherosclerosis ([place]) 10/08/2018   Arthritis    Chronic kidney disease, stage 3a ([place])    Chronic respiratory failure ([place])    Diabetes mellitus without complication ([place])    GERD (gastroesophageal reflux disease)    Hepatic steatosis 10/08/2018   Hyperlipidemia    Hypertension    Hyperthyroidism    Mass of right ovary 10/08/2018   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/w benign thyroid tissue   Mild CAD    Osteoporosis    Pneumonia    Renal infarct (HCC)    SBO (small bowel obstruction) ([place]) 08/09/2018   Severe pulmonary hypertension ([place])    Stroke (cerebrum) (HCC)      Social History   Socioeconomic History   Marital status: Widowed    Spouse name: Not on file   Number of children: Not on file   Years of education: Not on file   Highest education level: Not on file  Occupational History   Not on file  Tobacco Use   Smoking status: Never   Smokeless tobacco: Never  Vaping Use   Vaping Use: Never used  Substance and Sexual Activity   Alcohol use: No   Drug use: No   Sexual activity: Not on file  Other Topics Concern   Not on file  Social History Narrative   Patient is from [place]   Speaks [place]   Social Determinants of Health   Financial Resource Strain: Not on file  Food Insecurity: Not on file  Transportation Needs: Not on file  Physical Activity: Not on file  Stress: Not on file  Social Connections: Not on file    High Risk Criteria for Readmission and/or Poor Patient Outcomes: Heart failure hospital admissions (last 6 months): ***  No Show rate: *** Difficult social situation: *** Demonstrates medication adherence: *** Primary Language: Creole Literacy level:  ***  Barriers of Care:   ***  Considerations/Referrals:   Referral made to Heart Failure Pharmacist Stewardship: *** Referral made to Heart Failure CSW/NCM TOC: *** Referral made to Heart & Vascular TOC clinic: ***  Items for Follow-up on DC/TOC: ***   ***

## 2021-05-25 NOTE — Consult Note (Signed)
NAME:  Mandy [name], MRN:  [id number], DOB:  Jun 28, 1940, LOS: 3 ADMISSION DATE:  05/22/2021, CONSULTATION DATE:  05/22/2021 REFERRING MD:  Flossie Buffy, CHIEF COMPLAINT:  Pulmonary HTN , Volume overload , on BiPAP   History of Present Illness:  81 year old woman who has been followed in our office for severe pulmonary hypertension.  She is a never smoker, has history of systemic hypertension, CHF, CVA, diabetes, hypothyroidism, CKD stage III, obesity.  Pulmonary hypertension confirmed on right heart catheterization with a mean PASP 39 mmHg and PAOP 18, preserved cardiac output 6.1.  Course complicated by pleural effusions, right hydropneumothorax, thoracentesis while hospitalized in the past. . She was started on sildenafil in addition to Opsumit. Has had nausea with initiation of the Opsumit.  She was  changed to tadalafil when she saw Dr. Silas Flood in follow-up 04/28/2021 she was on 4 L/min oxygen.  Most recent hospitalization was 05/07/2021- 05/14/2021. Diuretic regimen furosemide 40 mg twice daily, increased due to progressive shortness of breath on 05/07/2021 to 80 mg  in the am and 40 mg in the pm.  daily.  Pt. developed worsening shortness of breath and malaise that has become progressive since discharge 05/14/2021. Chest x-ray suggestive of progressive pulmonary edema and . and effusions which appear similar to x-ray when she required hospitalization at the beginning of this month.    troponins are relatively flat at 86 and 96, CBC with unchanged hemoglobin at 9.  CMP with unchanged renal function with creatinine of 1.33 and stable electrolytes.  Patient has received 120 mg of IV Lasix in the ED. She is on BiPAP with sats of 96%. PCCM have been called to consult by Primary Team [facility] .  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 Left ventricular ejection fraction, by estimation, is 55 to 60%. The  left ventricle has normal function. The left ventricle has no regional  wall motion abnormalities. There is severe left ventricular hypertrophy.  Left ventricular diastolic parameters  are consistent with Grade II diastolic dysfunction (pseudonormalization).  Elevated left atrial pressure.   2. Right ventricular systolic function is moderately reduced. The right  ventricular size is mildly enlarged. There is severely elevated pulmonary  artery systolic pressure.   3. A small pericardial effusion is present. Moderate pleural effusion in  the right lateral region.   4. The mitral valve is normal in structure. Mild mitral valve  regurgitation. No evidence of mitral stenosis.   5. Tricuspid valve regurgitation is moderate.   6. The aortic valve is tricuspid. Aortic  valve regurgitation is not  visualized. No aortic stenosis is present.   7. The inferior vena cava is dilated in size with >50% respiratory  variability, suggesting right atrial pressure of 8 mmHg.     Interim History / Subjective:  No complaints currently. Now down to 2L satting 100% no distress.   Objective   Blood pressure (!) 155/78, pulse 90,  temperature 97.6 F (36.4 C), temperature source Oral, resp. rate 16, height '4\' 11"'$  (1.499 m), weight 67.5 kg, SpO2 100 %.        Intake/Output Summary (Last 24 hours) at 05/25/2021 0929 Last data filed at 05/25/2021 0900 Gross per 24 hour  Intake 360 ml  Output --  Net 360 ml    Filed Weights   05/24/21 0028 05/24/21 0500 05/25/21 0406  Weight: 68 kg 68 kg 67.5 kg    Examination: General:  Elderly appearing female in NAD HENT:  [place]/AT, PERRL Lungs:  Diminished bases, no distress.  Cardiovascular:  RRR, no MRG Abdomen: Soft, NT, ND Extremities: No acute deformity. No edema.  Neuro:  Awake, alert, oriented. Non-focal  Resolved Hospital Problem list     Assessment & Plan:  Acute on chronic hypoxemic respiratory failure.   Acute Pulmonary Edema Bilateral pleural effusions Acute on Chronic Heart Failure with preserved EF Decompensated Pulmonary Hypertension, multifactorial.  Tracheobronchomalacia Plan Diuresis as tolerated. Appreciate cardiology.  Replete electrolytes as needed Doing well on sildenafil No plans to restart Adcirca at this time  Discontinue Opsumit Needs PFT's and in lab sleep study to confirm diagnosis of tracheobronchomalasia and need for CPAP therapy as OP.      Best Practice (right click and "Reselect all SmartList Selections" daily)   Diet/type:  per primary DVT prophylaxis:  Lovenox  GI prophylaxis:  Protonix Lines: PIV Foley:  NA Code Status:  Full Last date of multidisciplinary goals of care discussion (Per Primary team)    Georgann Housekeeper, AGACNP-BC [place] for personal pager PCCM on call pager [phone] until 7pm. Please call Elink 7p-7a. [phone]  05/25/2021 9:41 AM

## 2021-05-25 NOTE — Care Management Important Message (Signed)
Important Message  Patient Details  Name: Mandy [name] MRN: [id number] Date of Birth: [birth year]   Medicare Important Message Given:  Yes     Flavio Lindroth [address] 05/25/2021, 3:14 PM

## 2021-05-25 NOTE — Progress Notes (Signed)
Mobility Specialist Progress Note    05/25/21 1131  Mobility  Activity Ambulated with assistance in hallway  Level of Assistance Minimal assist, patient does 75% or more  Assistive Device Front wheel walker  Distance Ambulated (ft) 90 ft  Activity Response Tolerated fair  $Mobility charge 1 Mobility    Pre-Mobility: 88 HR, 143/62 BP, 99% SpO2 Post-Mobility: 89 HR, 100% SpO2  Pt received in bed and agreeable. Attempted void on BSC. Ambulated on 3LO2. Took a few short standing rest breaks. C/o dizziness. Returned to chair with call bell in reach.   Hildred Alamin Mobility Specialist  Primary: 5N M.S. Phone: [phone] Secondary: 6N M.S. Phone: [phone]

## 2021-05-25 NOTE — Discharge Summary (Signed)
Physician Discharge Summary   Patient: Mandy [name] MRN: [id number] DOB: August 31, 1940  Admit date:     05/22/2021  Discharge date: 05/25/21  Discharge Physician: Jimmy Picket Arkel Cartwright   PCP: [name], MD   Recommendations at discharge:    Patient will continue taking torsemide 40 mg po bid and added dapagliflozin.  For pulmonary hypertension will continue with sildenafil.  Follow up renal function and electrolytes in 7 days. Follow up with Dr Jeanie Cooks in 7 to 10 days Follow up with cardiology and pulmonary as outpatient  I spoke over the phone with the patient's daughter about patient's  condition, plan of care, prognosis and all questions were addressed.   Discharge Diagnoses: Principal Problem:   Acute on chronic diastolic CHF (congestive heart failure) (HCC) Active Problems:   AKI (acute kidney injury) ([place])   Essential hypertension   Dyslipidemia   Hyperthyroidism   Class 1 obesity  Resolved Problems:   * No resolved hospital problems. [facility] Course: Mandy [name] was admitted to the hospital with the working diagnosis of decompensated heart failure in the setting of pulmonary hypertension.   81 yo female with the past medical history of coronary artery disease, type 2 DM, hypertension and severe pulmonary hypertension who presented with dyspnea. Recent hospitalization 05/05 to 05/12/21 for decompensated heart failure, pulmonary hypertension. Not able to get home Cpap/bipap. At home she had recurrence of worsening dyspnea along with weight gain. As outpatient her diuretic therapy was increased, but unfortunately her symptoms continue to worsen prompting her to come back to the hospital. On her initial physical examination patient was in respiratory distress and was placed on non invasive mechanical ventilation. Her blood pressure was 188/61, HR 83, RR 20 and 02 saturation 95%, lungs with no wheezing or rhonchi, heart with S1 and S2 present and rhythmic, abdomen not  distended, no  lower extremity edema.    Na 132, K 4,6, Cl 88, bicarbonate at 36, glucose 134, bun 26 cr 1,33 High sensitive troponin 86 and 96  Wbc 5,4 hgb 9,4 plt 263   Chest radiograph with cardiomegaly, bilateral interstitial infiltrates predominant lower lobes and right upper lobe, small bilateral pleural effusions. Hypoinflation.   EKG 98 bpm, normal axis, normal intervals, sinus rhythm with no significant ST segment or T wave changes.   Patient was placed on furosemide for diuresis with improvement in oxygenation. Liberated from non invasive mechanical ventilation and transitioned to high flow nasal cannula.  At the time of her discharge her oxygenation was 100% on 2L/min per HFNC  Patient will continue diuresis with torsemide at home.   Assessment and Plan: * Acute on chronic diastolic CHF (congestive heart failure) ([place]) Patient was admitted to the cardiac ward and received IV furosemide for diuresis, negative fluid balance was achieved with significant improvement in her symptoms.   Echocardiogram with LV systolic function preserved 55 to 60%, with severe left ventricular hypertrophy. Moderate reduction in RV systolic function, severely elevated pulmonary artery systolic pressure. Small pericardial effusion. Moderate tricuspid valve regurgitation.   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   Patient will continue heart failure management with carvedilol, hydralazine and empagliflozin.   Patient with pulmonary hypertension group 1 and 2. Dr Silas Flood recommended stopping tadalafil and Opsumit and restarting patient on sildenafil.   Acute on chronic hypoxemic respiratory failure due to cardiogenic pulmonary edema,.  Patient has responded to diuresis well. At the time of her discharge her dyspnea has improved, her oxygenation is 100% on room  air.    AKI (acute kidney injury) ([place]) CKD stage 3a, Hypomagnsemia. Metabolic alkalosis   Patient with  improved volume status, at the time of her discharge her renal function has a serum cr of 1,31 with K at 4,1 and serum bicarbonate at 36. Continue diuresis with torsemide and follow up renal function as outpatient.   Essential hypertension Continue blood pressure control with carvedilol and hydralazine.   Dyslipidemia Continue with statin therapy.   Hyperthyroidism Continue with methimazole.   Class 1 obesity Calculated BMI is 32,0          Consultants: cardiology  Procedures performed: none  Disposition: Home Diet recommendation:  Cardiac diet DISCHARGE MEDICATION: Allergies as of 05/25/2021   No Known Allergies      Medication List     STOP taking these medications    Opsumit 10 MG tablet Generic drug: macitentan   tadalafil (PAH) 20 MG tablet Commonly known as: ADCIRCA       TAKE these medications    acetaminophen 500 MG tablet Commonly known as: TYLENOL Take 1,000 mg by mouth every 6 (six) hours as needed for fever or headache (pain).   albuterol (2.5 MG/3ML) 0.083% nebulizer solution Commonly known as: PROVENTIL Take 2.5 mg by nebulization every 6 (six) hours as needed for wheezing or shortness of breath.   albuterol 108 (90 Base) MCG/ACT inhaler Commonly known as: VENTOLIN HFA Inhale 2 puffs into the lungs every 6 (six) hours as needed for wheezing or shortness of breath.   alendronate 70 MG tablet Commonly known as: FOSAMAX Take 70 mg by mouth once a week.   atorvastatin 80 MG tablet Commonly known as: LIPITOR Take 1 tablet (80 mg total) by mouth every evening.   calcium carbonate 1500 (600 Ca) MG Tabs tablet Commonly known as: OSCAL Take 600 mg of elemental calcium by mouth 2 (two) times daily with a meal.   carvedilol 12.5 MG tablet Commonly known as: COREG Take 1 tablet (12.5 mg total) by mouth 2 (two) times daily with a meal.   cetirizine 10 MG tablet Commonly known as: ZYRTEC Take 10 mg by mouth daily.   clopidogrel 75 MG  tablet Commonly known as: PLAVIX Take 1 tablet (75 mg total) by mouth daily.   dapagliflozin propanediol 10 MG Tabs tablet Commonly known as: FARXIGA Take 1 tablet (10 mg total) by mouth daily.   diclofenac sodium 1 % Gel Commonly known as: VOLTAREN Apply 4 g topically 4 (four) times daily as needed for pain. shoulder   feeding supplement Liqd Take 237 mLs by mouth 2 (two) times daily between meals.   FISH OIL PO Take 1 capsule by mouth every morning.   fluticasone 50 MCG/ACT nasal spray Commonly known as: FLONASE Place 1 spray into both nostrils daily. What changed:  when to take this reasons to take this   gabapentin 100 MG capsule Commonly known as: NEURONTIN Take 100 mg by mouth at bedtime.   guaiFENesin 600 MG 12 hr tablet Commonly known as: MUCINEX Take 600 mg by mouth 2 (two) times daily as needed.   hydrALAZINE 100 MG tablet Commonly known as: APRESOLINE Take 50 mg by mouth 3 (three) times daily.   melatonin 5 MG Tabs Take 10 mg by mouth at bedtime.   methimazole 5 MG tablet Commonly known as: TAPAZOLE Take 0.5 tablets (2.5 mg total) by mouth daily.   multivitamin Tabs tablet Take 1 tablet by mouth at bedtime.   multivitamin with minerals Tabs tablet Take 1  tablet by mouth every morning.   OXYGEN Inhale 4 L into the lungs continuous.   pantoprazole 40 MG tablet Commonly known as: PROTONIX Take 1 tablet (40 mg total) by mouth daily at 6 (six) AM. What changed: when to take this   potassium chloride SA 20 MEQ tablet Commonly known as: KLOR-CON M Take 1 tablet (20 mEq total) by mouth daily.   sildenafil 20 MG tablet Commonly known as: REVATIO Take 1 tablet (20 mg total) by mouth 3 (three) times daily.   Torsemide 40 MG Tabs Take 40 mg by mouth 2 (two) times daily.   vitamin B-12 1000 MCG tablet Commonly known as: CYANOCOBALAMIN Take 1,000 mcg by mouth daily.   vitamin C 500 MG tablet Commonly known as: ASCORBIC ACID Take 500 mg by mouth  daily.   Vitamin D (Cholecalciferol) 25 MCG (1000 UT) Tabs Take 1,000 Units by mouth daily.   zinc gluconate 50 MG tablet Take 50 mg by mouth daily.        Discharge Exam: Filed Weights   05/24/21 0028 05/24/21 0500 05/25/21 0406  Weight: 68 kg 68 kg 67.5 kg   BP (!) 155/78 (BP Location: Right Arm)   Pulse 90   Temp 97.6 F (36.4 C) (Oral)   Resp 16   Ht '4\' 11"'$  (1.499 m)   Wt 67.5 kg   SpO2 100%   BMI 30.06 kg/m   Patient with improved in dyspnea, no chest pain.   Neurology awake and alert ENT with no pallor Cardiovascular with S1 and S2 present and rhythmic with no gallops, positive murmur at the right sternal border. Loud P2.  Positive JVD moderate No lower extremity edema Respirator with no wheezing or rales Abdomen not distended   Condition at discharge: stable  The results of significant diagnostics from this hospitalization (including imaging, microbiology, ancillary and laboratory) are listed below for reference.   Imaging Studies: DG Chest 2 View  Result Date: 05/07/2021 CLINICAL DATA:  Shortness of breath, severe pulmonary hypertension, history of pleural effusions, diabetes mellitus, hypertension, stage III A chronic kidney disease, stroke EXAM: CHEST - 2 VIEW COMPARISON:  02/18/2021 FINDINGS: Enlargement of cardiac silhouette with pulmonary vascular congestion. Atherosclerotic calcification aorta. Perihilar interstitial infiltrates likely pulmonary edema. Bibasilar effusions and atelectasis greater on RIGHT. No pneumothorax or acute osseous findings. IMPRESSION: Enlargement of cardiac silhouette with pulmonary vascular congestion and suspected edema/CHF. Bibasilar effusions and atelectasis greater on RIGHT. Electronically Signed   By: Lavonia Dana M.D.   On: 05/07/2021 14:55   CT Angio Chest PE W and/or Wo Contrast  Result Date: 05/07/2021 CLINICAL DATA:  Suspected pulmonary embolism. EXAM: CT ANGIOGRAPHY CHEST WITH CONTRAST TECHNIQUE: Multidetector CT imaging  of the chest was performed using the standard protocol during bolus administration of intravenous contrast. Multiplanar CT image reconstructions and MIPs were obtained to evaluate the vascular anatomy. RADIATION DOSE REDUCTION: This exam was performed according to the departmental dose-optimization program which includes automated exposure control, adjustment of the mA and/or kV according to patient size and/or use of iterative reconstruction technique. CONTRAST:  20m OMNIPAQUE IOHEXOL 350 MG/ML SOLN COMPARISON:  Chest x-ray 05/07/2021. CT chest 12/14/2020. FINDINGS: Cardiovascular: Satisfactory opacification of the pulmonary arteries to the segmental level. No evidence of pulmonary embolism. Heart is enlarged. No pericardial effusion. There are atherosclerotic calcifications of the aorta. The main pulmonary artery is enlarged compatible with pulmonary artery hypertension similar to the prior study. Mediastinum/Nodes: There is enlarged precarinal lymph node measuring 12 mm short axis, slightly increased  in size compared to the prior study. There is an enlarged right hilar lymph node measuring 15 mm short axis, likely new from prior. There is an enlarged subcarinal lymph node measuring 14 mm short axis which is also new. Lungs/Pleura: Small to moderate-sized right pleural effusion appears loculated in several places, but has slightly decreased in size. Small left pleural effusion appears stable. There is atelectasis and airspace disease in the right lower lobe and right middle lobe. Multifocal ground-glass opacities are seen throughout the mid and upper lungs with some smooth interlobular septal thickening similar to the prior study. There is a band of atelectasis in the right upper lobe. No evidence for pneumothorax. Upper Abdomen: No acute abnormality. Musculoskeletal: Degenerative changes affect the spine. No acute fractures. Review of the MIP images confirms the above findings. IMPRESSION: 1. No evidence for  pulmonary embolism. 2. Small to moderate-sized right pleural effusion has decreased in size, but is now loculated in several places throughout the hemithorax. Infection not excluded. 3. Stable small left pleural effusion. 4. Right middle lobe and right lower lobe airspace disease. 5. New mediastinal lymphadenopathy. 6. Ground-glass in interlobular septal thickening in both mid and upper lungs may represent pulmonary edema. Infection not excluded. 7. Stable cardiomegaly with enlargement of the main pulmonary artery compatible with pulmonary artery hypertension. Aortic Atherosclerosis (ICD10-I70.0). Electronically Signed   By: Ronney Asters M.D.   On: 05/07/2021 20:47   CT Chest High Resolution  Result Date: 05/10/2021 CLINICAL DATA:  Chronic shortness of breath, tracheobronchomalacia. EXAM: CT CHEST WITHOUT CONTRAST TECHNIQUE: Multidetector CT imaging of the chest was performed following the standard protocol without intravenous contrast. High resolution imaging of the lungs, as well as inspiratory and expiratory imaging, was performed. RADIATION DOSE REDUCTION: This exam was performed according to the departmental dose-optimization program which includes automated exposure control, adjustment of the mA and/or kV according to patient size and/or use of iterative reconstruction technique. COMPARISON:  05/07/2021 and 12/14/2020. FINDINGS: Cardiovascular: Atherosclerotic calcification of the aorta, aortic valve and coronary arteries. Enlarged pulmonic trunk and heart. No pericardial effusion. Mediastinum/Nodes: Mediastinal lymph nodes measure up to 12 mm in the low right paratracheal station, similar. Hilar regions are difficult to evaluate without IV contrast. No axillary adenopathy. Esophagus is grossly unremarkable. Lungs/Pleura: Added density in the lungs is likely due to expiratory phase imaging. Mild septal thickening. Large loculated right pleural effusion. Small loculated left pleural effusion. Right lower  lobe collapse/consolidation. On expiratory phase imaging there is slightly greater than 50% collapse of the trachea. Air trapping is seen as well. Upper Abdomen: Visualized portions of the liver and right adrenal gland are unremarkable. Slight thickening of the left adrenal gland. No specific follow-up necessary. Mild residual parenchymal hyperattenuation in the kidneys may be due to recent contrast administration. Visualized portions of the spleen, pancreas, stomach and bowel are grossly unremarkable. Musculoskeletal: Degenerative changes in the spine. Old right rib fractures. No worrisome lytic or sclerotic lesions. IMPRESSION: 1. Excessive airway collapse on expiration, compatible with tracheobronchomalacia. 2. Large loculated right pleural effusion with collapse/consolidation in the right lower lobe, worrisome for pneumonia. 3. Small loculated left pleural effusion. 4. Mild septal thickening, suggesting pulmonary edema. 5. Minimally enlarged mediastinal lymph nodes, likely reactive. 6. Aortic atherosclerosis (ICD10-I70.0). Coronary artery calcification. 7. Enlarged pulmonic trunk, indicative of pulmonary arterial hypertension. Electronically Signed   By: Lorin Picket M.D.   On: 05/10/2021 16:30   DG CHEST PORT 1 VIEW  Result Date: 05/23/2021 CLINICAL DATA:  Acute exacerbation of CHF. EXAM:  PORTABLE CHEST 1 VIEW COMPARISON:  Chest radiograph dated 05/22/2021. FINDINGS: Cardiomegaly with vascular congestion and edema 10 similar to prior radiograph. Small bilateral pleural effusions and bibasilar atelectasis or infiltrate. No pneumothorax. Atherosclerotic calcification of the aorta. No acute osseous pathology. IMPRESSION: 1. Cardiomegaly with findings of CHF, similar to prior radiograph. 2. Small bilateral pleural effusions and bibasilar atelectasis or infiltrate. Electronically Signed   By: Anner Crete M.D.   On: 05/23/2021 20:22   DG Chest Portable 1 View  Result Date: 05/22/2021 CLINICAL DATA:   81 year old female with history of shortness of breath. EXAM: PORTABLE CHEST 1 VIEW COMPARISON:  Chest x-ray 05/07/2021. FINDINGS: Lung volumes are low. Extensive bibasilar opacities (right greater than left) which may reflect areas of atelectasis and/or consolidation, with superimposed enlarging moderate to large right and moderate left pleural effusions. Patchy areas of interstitial prominence are noted throughout the mid lungs bilaterally and in the right upper lobe. No pneumothorax. Pulmonary vasculature appears engorged. Heart size is moderately enlarged. Upper mediastinal contours are distorted by patient positioning. Atherosclerotic calcifications in the thoracic aorta. IMPRESSION: 1. The appearance of the chest remains most concerning for congestive heart failure, as above. 2. Aortic atherosclerosis. Electronically Signed   By: Vinnie Langton M.D.   On: 05/22/2021 07:32   ECHOCARDIOGRAM COMPLETE  Result Date: 05/08/2021    ECHOCARDIOGRAM REPORT   Patient Name:   Mandy [name] Date of Exam: 05/08/2021 Medical Rec #:  [id number]      Height:       59.0 in Accession #:    [id number]     Weight:       156.7 lb Date of Birth:  [birth year]       BSA:          1.663 m Patient Age:    8 years       BP:           178/63 mmHg Patient Gender: F              HR:           95 bpm. Exam Location:  Inpatient Procedure: 2D Echo, 3D Echo, Cardiac Doppler and Color Doppler Indications:    I50.40* Unspecified combined systolic (congestive) and diastolic                 (congestive) heart failure  History:        Patient has prior history of Echocardiogram examinations, most                 recent 02/15/2020. CHF, Previous Myocardial Infarction, Pulmonary                 HTN and Stroke, Signs/Symptoms:Dizziness/Lightheadedness; Risk                 Factors:Hypertension, Diabetes and Dyslipidemia. Pleural                 effusion.  Sonographer:    Roseanna Rainbow RDCS Referring Phys: [place]  1. Left  ventricular ejection fraction, by estimation, is 55 to 60%. The left ventricle has normal function. The left ventricle has no regional wall motion abnormalities. There is severe left ventricular hypertrophy. Left ventricular diastolic parameters  are consistent with Grade II diastolic dysfunction (pseudonormalization). Elevated left atrial pressure.  2. Right ventricular systolic function is moderately reduced. The right ventricular size is mildly enlarged. There is severely elevated pulmonary artery systolic pressure.  3. A small pericardial effusion is present.  Moderate pleural effusion in the right lateral region.  4. The mitral valve is normal in structure. Mild mitral valve regurgitation. No evidence of mitral stenosis.  5. Tricuspid valve regurgitation is moderate.  6. The aortic valve is tricuspid. Aortic valve regurgitation is not visualized. No aortic stenosis is present.  7. The inferior vena cava is dilated in size with >50% respiratory variability, suggesting right atrial pressure of 8 mmHg. FINDINGS  Left Ventricle: Left ventricular ejection fraction, by estimation, is 55 to 60%. The left ventricle has normal function. The left ventricle has no regional wall motion abnormalities. The left ventricular internal cavity size was normal in size. There is  severe left ventricular hypertrophy. Left ventricular diastolic parameters are consistent with Grade II diastolic dysfunction (pseudonormalization). Elevated left atrial pressure. Right Ventricle: The right ventricular size is mildly enlarged. Right ventricular systolic function is moderately reduced. There is severely elevated pulmonary artery systolic pressure. The tricuspid regurgitant velocity is 3.86 m/s, and with an assumed right atrial pressure of 8 mmHg, the estimated right ventricular systolic pressure is 56.3 mmHg. Left Atrium: Left atrial size was normal in size. Right Atrium: Right atrial size was normal in size. Pericardium: A small pericardial  effusion is present. Mitral Valve: The mitral valve is normal in structure. Mild mitral valve regurgitation. No evidence of mitral valve stenosis. Tricuspid Valve: The tricuspid valve is normal in structure. Tricuspid valve regurgitation is moderate . No evidence of tricuspid stenosis. Aortic Valve: The aortic valve is tricuspid. Aortic valve regurgitation is not visualized. No aortic stenosis is present. Pulmonic Valve: The pulmonic valve was normal in structure. Pulmonic valve regurgitation is trivial. No evidence of pulmonic stenosis. Aorta: The aortic root is normal in size and structure. Venous: The inferior vena cava is dilated in size with greater than 50% respiratory variability, suggesting right atrial pressure of 8 mmHg. IAS/Shunts: The interatrial septum is aneurysmal. No atrial level shunt detected by color flow Doppler. Additional Comments: There is a moderate pleural effusion in the right lateral region.  LEFT VENTRICLE PLAX 2D LVIDd:         4.60 cm     Diastology LVIDs:         2.80 cm     LV e' medial:    5.77 cm/s LV PW:         1.80 cm     LV E/e' medial:  19.3 LV IVS:        1.50 cm     LV e' lateral:   7.51 cm/s LVOT diam:     2.30 cm     LV E/e' lateral: 14.8 LV SV:         88 LV SV Index:   53 LVOT Area:     4.15 cm                             3D Volume EF: LV Volumes (MOD)           3D EF:        59 % LV vol d, MOD A2C: 78.5 ml LV EDV:       101 ml LV vol d, MOD A4C: 71.0 ml LV ESV:       42 ml LV vol s, MOD A2C: 30.2 ml LV SV:        60 ml LV vol s, MOD A4C: 22.6 ml LV SV MOD A2C:     48.3 ml LV SV  MOD A4C:     71.0 ml LV SV MOD BP:      52.7 ml RIGHT VENTRICLE             IVC RV S prime:     10.10 cm/s  IVC diam: 2.30 cm TAPSE (M-mode): 1.4 cm LEFT ATRIUM             Index        RIGHT ATRIUM           Index LA diam:        2.50 cm 1.50 cm/m   RA Area:     13.80 cm LA Vol (A2C):   45.4 ml 27.30 ml/m  RA Volume:   26.60 ml  16.00 ml/m LA Vol (A4C):   60.4 ml 36.32 ml/m LA Biplane Vol:  56.7 ml 34.10 ml/m  AORTIC VALVE             PULMONIC VALVE LVOT Vmax:   106.00 cm/s PR End Diast Vel: 1.78 msec LVOT Vmean:  75.300 cm/s LVOT VTI:    0.211 m  AORTA Ao Root diam: 2.90 cm Ao Asc diam:  3.20 cm MITRAL VALVE                TRICUSPID VALVE MV Area (PHT): 4.26 cm     TR Peak grad:   59.6 mmHg MV Decel Time: 178 msec     TR Vmax:        386.00 cm/s MV E velocity: 111.50 cm/s MV A velocity: 107.50 cm/s  SHUNTS MV E/A ratio:  1.04         Systemic VTI:  0.21 m                             Systemic Diam: 2.30 cm Kirk Ruths MD Electronically signed by Kirk Ruths MD Signature Date/Time: 05/08/2021/1:41:30 PM    Final     Microbiology: Results for orders placed or performed during the hospital encounter of 05/07/21  MRSA Next Gen by PCR, Nasal     Status: None   Collection Time: 05/07/21  8:58 PM   Specimen: Nasal Mucosa; Nasal Swab  Result Value Ref Range Status   MRSA by PCR Next Gen NOT DETECTED NOT DETECTED Final    Comment: (NOTE) The GeneXpert MRSA Assay (FDA approved for NASAL specimens only), is one component of a comprehensive MRSA colonization surveillance program. It is not intended to diagnose MRSA infection nor to guide or monitor treatment for MRSA infections. Test performance is not FDA approved in patients less than 16 years old. Performed at [facility], [place] [address] [place], [place] [address]   Blood culture (routine x 2)     Status: None   Collection Time: 05/07/21  9:23 PM   Specimen: BLOOD RIGHT HAND  Result Value Ref Range Status   Specimen Description BLOOD RIGHT HAND  Final   Special Requests   Final    BOTTLES DRAWN AEROBIC AND ANAEROBIC Blood Culture adequate volume   Culture   Final    NO GROWTH 5 DAYS Performed at [facility], [place] [address] [place], [place] [address]    Report Status 05/12/2021 FINAL  Final  Blood culture (routine x 2)     Status: None   Collection Time: 05/08/21  1:19 AM   Specimen: BLOOD  Result Value Ref  Range Status   Specimen Description BLOOD BLOOD LEFT HAND  Final   Special Requests   Final    BOTTLES DRAWN AEROBIC ONLY Blood Culture results may not be optimal due to an inadequate volume of blood received in culture bottles   Culture   Final    NO GROWTH 5 DAYS Performed at [facility], [place] [address], [place], [place] [address]    Report Status 05/13/2021 FINAL  Final    Labs: CBC: Recent Labs  Lab 05/22/21 0733 05/23/21 0107  WBC 5.4 6.0  HGB 9.4* 8.9*  HCT 31.0* 28.3*  MCV 81.4 78.4*  PLT 263 115   Basic Metabolic Panel: Recent Labs  Lab 05/22/21 0558 05/23/21 0107 05/24/21 0053 05/25/21 0113  NA 132* 137 139 138  K 4.6 3.7 3.8 4.1  CL 88* 89* 93* 93*  CO2 36* 39* 40* 36*  GLUCOSE 134* 85 122* 95  BUN 26* '19 19 22  '$ CREATININE 1.33* 1.19* 1.20* 1.31*  CALCIUM 9.3 9.1 8.6* 8.7*  MG  --  1.8 2.2  --    Liver Function Tests: Recent Labs  Lab 05/23/21 0107  AST 16  ALT 10  ALKPHOS 34*  BILITOT 0.6  PROT 6.3*  ALBUMIN 3.3*   CBG: No results for input(s): GLUCAP in the last 168 hours.  Discharge time spent: greater than 30 minutes.  Signed: Tawni Millers, MD [facility] 05/25/2021

## 2021-05-25 NOTE — TOC Initial Note (Addendum)
Transition of Care [facility]) - Initial/Assessment Note    Patient Details  Name: Mandy [name] MRN: [id number] Date of Birth: 1940/03/20  [facility] CM/SW Contact:    Angelita Ingles, RN Phone Number:[phone]  05/25/2021, 12:16 PM  Clinical Narrative:                 TOC following for patient with high risk for readmission and need for heart failure screen. CM received message from MD requesting to know if patient has been set up with home Bipap or CPAP. CM has no referral for this to be set up. MD states that this was from previous admission. CM called daughter to inquire about patient having CPAP or Bipap at home. Daughter states that her mother does not have a Bipap/ CPAP at home. Daughter begins to tell CM that patient is from home with her. Daughter states that patient does have heart failure and is aware of her heart failure. Per daughter patient has a cardiologist that she follows on a regular basis Dr. Senaida Lange. Per daughter patient does have a scale at home and is aware that she is to weigh herself. Daughter states that patient does have access to medications that are affordable and transportation to appointments and to pick up medications. Daughter is in the process of setting up transportation for times that the daughter is unable to transport patient.  Patient is currently active with [facility] for home services and daughter is ok with resuming services with [facility]. TOC will continue to follow for Bipap set up.   1220 CM received call from [facility] with [facility] stating that patient will need to have abg documented in order to qualify for home CPAP. Message has been sent to Dr. Cathlean Sauer.   61 MD to order abg. Will continue to follow   1343 Blood gas has resulted Jermaine with [employer] made aware.   Expected Discharge Plan: [place] Barriers to Discharge: No Barriers Identified   Patient Goals and CMS Choice Patient states their goals for this  hospitalization and ongoing recovery are:: spoke with daughter CMS Medicare.gov Compare Post Acute Care list provided to:: Patient Represenative (must comment) (daughter) Choice offered to / list presented to : Adult Children  Expected Discharge Plan and Services Expected Discharge Plan: [place] In-house Referral: NA Discharge Planning Services: CM Consult   Living arrangements for the past 2 months: Single Family Home Expected Discharge Date: 05/25/21               DME Arranged: N/A         HH Arranged: PT (to resume services) [place]: [facility] (Patient is currently active with Alvis Lemmings) Date [facility] Agency Contacted: 05/25/21 Time [place]: 1215 Representative spoke with at [place]: Tommi Rumps  Prior Living Arrangements/Services Living arrangements for the past 2 months: [place] with:: Adult Children Patient language and need for interpreter reviewed:: Yes        Need for Family Participation in Patient Care: Yes (Comment) Care giver support system in place?: Yes (comment) Current home services: Home PT Criminal Activity/Legal Involvement Pertinent to Current Situation/Hospitalization: No - Comment as needed  Activities of Daily Living      Permission Sought/Granted                  Emotional Assessment Appearance::  (CM spoke with daughter via phone) Attitude/Demeanor/Rapport: Unable to Assess Affect (typically observed): Unable to  Assess   Alcohol / Substance Use: Not Applicable Psych Involvement: No (comment)  Admission diagnosis:  Acute on chronic diastolic CHF (congestive heart failure) ([place]) [I50.33] Patient Active Problem List   Diagnosis Date Noted   Class 1 obesity 05/23/2021   Acute on chronic diastolic CHF (congestive heart failure) ([place]) 05/22/2021   Elevated troponin 05/08/2021   DOE (dyspnea on exertion) 05/07/2021   History of stroke 05/07/2021   Pressure injury of skin 12/16/2020   CHF  exacerbation (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   Chest pain of uncertain etiology    Chronic respiratory failure with hypoxia, on home oxygen therapy ([place]) 11/24/2020   Anxiety 11/24/2020   Acute on chronic heart failure with preserved ejection fraction (HFpEF) ([place]) 11/23/2020   Hypoxia 11/03/2020   Recurrent right pleural effusion 10/01/2020   Hyperkalemia 10/01/2020   AKI (acute kidney injury) ([place]) 10/01/2020   Anemia 10/01/2020   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   Acute respiratory failure with hypoxia ([place]) 06/26/2020   Hypertensive urgency 06/26/2020   Generalized weakness 06/26/2020   Dizziness 06/26/2020   Obesity (BMI 30-39.9) 06/26/2020   Hyperthyroidism 06/26/2020   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 [place] [address] Phone: [phone] Fax: [phone]     Social Determinants of Health (SDOH) Interventions    Readmission Risk Interventions    05/25/2021   12:10 PM 05/11/2021    4:46 PM 02/05/2021    9:57 AM  Readmission Risk Prevention Plan  Transportation Screening Complete Complete Complete  Medication Review Press photographer) Complete Complete Complete  PCP or Specialist appointment within 3-5 days of discharge Complete Complete Complete  HRI or Home Care Consult Complete Complete Complete  SW Recovery  Care/Counseling Consult Complete Complete Complete  Palliative Care Screening Not Applicable Not Applicable Complete  [place] Not Applicable Not Applicable Not Applicable

## 2021-05-26 ENCOUNTER — Telehealth: Payer: Self-pay | Admitting: Primary Care

## 2021-05-26 ENCOUNTER — Encounter: Payer: Self-pay | Admitting: Primary Care

## 2021-05-26 ENCOUNTER — Ambulatory Visit (INDEPENDENT_AMBULATORY_CARE_PROVIDER_SITE_OTHER): Payer: Medicare HMO | Admitting: Primary Care

## 2021-05-26 DIAGNOSIS — I5033 Acute on chronic diastolic (congestive) heart failure: Secondary | ICD-10-CM

## 2021-05-26 DIAGNOSIS — Z9981 Dependence on supplemental oxygen: Secondary | ICD-10-CM | POA: Diagnosis not present

## 2021-05-26 DIAGNOSIS — J9611 Chronic respiratory failure with hypoxia: Secondary | ICD-10-CM | POA: Diagnosis not present

## 2021-05-26 DIAGNOSIS — I272 Pulmonary hypertension, unspecified: Secondary | ICD-10-CM

## 2021-05-26 MED ORDER — TORSEMIDE 40 MG PO TABS: 40.0000 mg | ORAL_TABLET | Freq: Two times a day (BID) | ORAL | 1 refills | Status: AC

## 2021-05-26 NOTE — Assessment & Plan Note (Addendum)
-    Appears euvolemic on exam today  - Continue diuresis with torsemide '40mg'$  twice daily (Rx provided) - Continue carvedilol, hydralazine and empagliflozin  - Continue to monitor daily weight  (use the same scale and and weight at same time of day) - Notify cardiology or Dr. Silas Flood any rapid weight gain, increased leg swelling or worsening shortness of breath

## 2021-05-26 NOTE — Patient Instructions (Addendum)
Pulmonary hypertension: - Stopped to Tadalafil and Opsumit  - Continue sildenafil '20mg'$  three times day  - Continue to wear __2L at rest and 4L with exertion_____L oxygen 24/7   Heart   - Continue Torsemide '40mg'$  morning and evening - Continue carvedilol, hydralazine and empagliflozin - Continue to monitor daily weight  (use the same scale and and weight at same time of day) - Notify cardiology or Dr. Silas Flood any rapid weight gain, increased leg swelling or worsening shortness of breath   Orders: - Ambulatory walk test on 2L   Follow-up: - First available with Dr. Silas Flood in 6 weeks   Heart Failure, Diagnosis  Heart failure is a condition in which the heart has trouble pumping blood. This may mean that the heart cannot pump enough blood out to the body or that the heart does not fill up with enough blood. For some people with heart failure, fluid may back up into the lungs. There may also be swelling (edema) in the lower legs. Heart failure is usually a long-term (chronic) condition. It is important for you to take good care of yourself and follow the treatment plan from your health care provider. Different stages of heart failure have different treatment plans. The stages are: Stage A: At risk for heart failure. Having no symptoms of heart failure, but being at risk for developing heart failure. Stage B: Pre-heart failure. Having no symptoms of heart failure, but having structural changes to the heart that indicate heart failure. Stage C: Symptomatic heart failure. Having symptoms of heart failure in addition to structural changes to the heart that indicate heart failure. Stage D: Advanced heart failure. Having symptoms that interfere with daily life and frequent hospitalizations related to heart failure. What are the causes? This condition may be caused by: High blood pressure (hypertension). Hypertension causes the heart muscle to work harder than normal. Coronary artery  disease, or CAD. CAD is the buildup of cholesterol and fat (plaque) in the arteries of the heart. Heart attack, also called myocardial infarction. This injures the heart muscle, making it hard for the heart to pump blood. Abnormal heart valves. The valves do not open and close properly, forcing the heart to pump harder to keep the blood flowing. Heart muscle disease, inflammation, or infection (cardiomyopathy or myocarditis). This is damage to the heart muscle. It can increase the risk of heart failure. Lung disease. The heart works harder when the lungs are not healthy. What increases the risk? The risk of heart failure increases as a person ages. This condition is also more likely to develop in people who: Are obese. Use tobacco or nicotine products. Abuse alcohol or drugs. Have taken medicines that can damage the heart, such as chemotherapy drugs. Have any of these conditions: Diabetes. Abnormal heart rhythms. Thyroid problems. Low blood counts (anemia). Chronic kidney disease. Have a family history of heart failure. What are the signs or symptoms? Symptoms of this condition include: Shortness of breath with activity, such as when climbing stairs. A cough that does not go away. Swelling of the feet, ankles, legs, or abdomen. Losing or gaining weight for no reason. Trouble breathing when lying flat. Waking from sleep because of the need to sit up and get more air. Rapid heartbeat. Other symptoms may include: Tiredness (fatigue) and loss of energy. Feeling light-headed, dizzy, or close to fainting. Nausea or loss of appetite. Waking up more often during the night to urinate (nocturia). Confusion. How is this diagnosed? This condition is diagnosed based on:  Your medical history, symptoms, and a physical exam. Blood tests. Diagnostic tests, which may include: Echocardiogram. Electrocardiogram (ECG). Chest X-ray. Exercise stress test. Cardiac MRI. Cardiac catheterization and  angiogram. Radionuclide scans. How is this treated? Treatment for this condition is aimed at managing the symptoms of heart failure. Medicines Treatment may include medicines that: Help lower blood pressure by relaxing (dilating) the blood vessels. These medicines are called ACE inhibitors (angiotensin-converting enzyme), ARBs (angiotensin receptor blockers), or vasodilators. Cause the kidneys to remove salt and water from the blood through urination (diuretics). Improve heart muscle strength and prevent the heart from beating too fast (beta blockers). Increase the force of the heartbeat (digoxin). Lower heart rates. Certain diabetes medicines (SGLT-2 inhibitors) may also be used in treatment. Healthy behavior changes Treatment may also include making healthy lifestyle changes, such as: Reaching and staying at a healthy weight. Not using tobacco or nicotine products. Eating heart-healthy foods. Limiting or avoiding alcohol. Stopping the use of illegal drugs. Being physically active. Participating in a cardiac rehabilitation program, which is a treatment program to improve your health and well-being through exercise training, education, and counseling. Other treatments Other treatments may include: Procedures to open blocked arteries or repair damaged valves. Placing a pacemaker to improve heart function (cardiac resynchronization therapy). Placing a device to treat serious abnormal heart rhythms (implantable cardioverter defibrillator, or ICD). Placing a device to improve the pumping ability of the heart (left ventricular assist device, or LVAD). Receiving a healthy heart from a donor (heart transplant). This is done when other treatments have not helped. Follow these instructions at home: Manage other health conditions as told by your health care provider. These may include hypertension, diabetes, thyroid disease, or abnormal heart rhythms. Get ongoing education and support as needed.  Learn as much as you can about heart failure. Keep all follow-up visits. This is important. Where to find more information American Heart Association: www.heart.org Centers for Disease Control and Prevention: http://www.wolf.info/ NIH National Institute on Aging: http://kim-miller.com/ Summary Heart failure is a condition in which the heart has trouble pumping blood. This condition is commonly caused by high blood pressure and other diseases of the heart and lungs. Symptoms of this condition include shortness of breath, tiredness (fatigue), nausea, and swelling of the feet, ankles, legs, or abdomen. Treatments for this condition may include medicines, lifestyle changes, and surgery. Manage other health conditions as told by your health care provider. This information is not intended to replace advice given to you by your health care provider. Make sure you discuss any questions you have with your health care provider. Document Revised: 06/18/2020 Document Reviewed: 07/13/2019 Elsevier Patient Education  [place].   Pulmonary Hypertension Pulmonary hypertension is a long-term (chronic) condition in which there is high blood pressure in the blood vessels of the lungs (pulmonary arteries). This condition occurs when pulmonary arteries become narrow and tight, making it harder for blood to flow through the lungs. This in turn makes the heart work harder to pump blood through the lungs, making it harder for you to breathe. Over time, pulmonary hypertension can weaken and damage the heart muscle, specifically the right side of the heart. Pulmonary hypertension is a serious condition that can be life-threatening. What are the causes? This condition may be caused by different medical conditions. It can be categorized by cause into five groups: Group 1: Pulmonary hypertension that is caused when the arteries in the lungs become narrowed, thickened, or stiff (pulmonary arterial hypertension). This may happen  with no  known cause, or it may be: Passed from parent to child (hereditary). Caused by another disease, such as a connective tissue disease (including lupus or scleroderma), congenital heart disease, liver disease, or HIV (human immunodeficiency virus). Caused by certain medicines or poisons. Group 2: Pulmonary hypertension that is caused by weakness of the left chamber of the heart (left ventricle) or heart valve disease. Group 3: Pulmonary hypertension that is caused by chronic lung disease or low oxygen levels. Causes in this group include: Emphysema or chronic obstructive pulmonary disease (COPD). Untreated sleep apnea. Pulmonary fibrosis. Long-term exposure to high altitudes in certain people who may already be at higher risk for pulmonary hypertension. Group 4: Pulmonary hypertension that is caused by blood clots in the lungs. Group 5: Other causes of pulmonary hypertension, such as sickle cell anemia, sarcoidosis, abnormal growths of tissue (tumors) pressing on the pulmonary arteries, and various other diseases. What are the signs or symptoms? Symptoms of this condition include: Shortness of breath. You may notice shortness of breath with: Activity, such as walking. Minimal activity, such as getting dressed. No activity, like when you are sitting still. A cough. Sometimes, bloody mucus from the lungs may be coughed up. Tiredness. Dizziness, light-headedness, or fainting, especially with physical activity. Rapid heartbeat, or feeling your heart flutter or skip a beat (palpitations). Veins in the neck getting larger. Swelling of the lower legs, abdomen, or both. Bluish color of the lips and fingertips. Chest pain or tightness. Abdominal pain, especially in the upper abdomen. How is this diagnosed? This condition may be diagnosed based on one or more of the following tests: Blood tests. Imaging tests, such as: Chest X-ray. CT scan. Echocardiogram. This test uses sound waves  (ultrasound) to produce an image of the heart. Ventilation-perfusion scan. This test uses radioactive material to test how well air moves and blood flows in and out of the lungs. Pulmonary function test. This test measures how much air your lungs can hold. It also tests how well air moves in and out of your lungs. 6-minute walk test. This tests how severe your condition is in relation to your activity levels. ECG (electrocardiogram). This test records the electrical impulses of the heart. Cardiac catheterization. This is a procedure in which a thin tube (catheter) is passed into the pulmonary artery and used to test the pressure in your pulmonary artery and the right side of your heart. Lung biopsy. This involves having a procedure to remove a small sample of lung tissue for testing. This may help determine an underlying cause of your pulmonary hypertension. How is this treated? There is no cure for this condition, but treatment can help to relieve symptoms and slow the progress of the condition. Treatment may include: Cardiac rehabilitation. This is a treatment program that includes exercise training, education, and counseling to help you get stronger and return to an active lifestyle. Oxygen therapy. Medicines that: Lower blood pressure. Relax (dilate) the pulmonary blood vessels. Help the heart beat more efficiently and pump more blood. Help the body get rid of extra fluid. Thin the blood in order to prevent blood clots in the lungs. Lung surgery to relieve pressure on the heart. This may be needed for severe cases that do not respond to medical treatment. Heart-lung transplant, or lung transplant. This may be done in very severe cases. Follow these instructions at home: Eating and drinking  Eat a healthy diet that includes plenty of fresh fruits and vegetables, whole grains, and beans. Limit your salt (sodium) intake  to less than 2,300 mg a day. Activity Get plenty of rest. Exercise as  directed. Talk with your health care provider about what type of exercise is safe for you. Avoid sitting in hot tubs or saunas for long periods of time. Avoid high altitudes. Lifestyle Do not use any products that contain nicotine or tobacco. These products include cigarettes, chewing tobacco, and vaping devices, such as e-cigarettes. If you need help quitting, ask your health care provider. Avoid secondhand smoke. General instructions Take over-the-counter and prescription medicines only as told by your health care provider. Do not change or stop medicines without checking with your health care provider. Stay up to date on your vaccines, especially yearly flu (influenza) and pneumonia vaccines. If you are a woman of child-bearing age, avoid becoming pregnant. Talk with your health care provider about birth control. Consider ways to get support for the anxiety and stress of living with pulmonary hypertension. Talk with your health care provider about support groups and online resources. Use oxygen therapy at home as directed. Keep track of your weight. Weight gain could be a sign that your condition is getting worse. Keep all follow-up visits. This is important. Contact a health care provider if: Your cough gets worse. You have more shortness of breath than usual, or you start to have trouble doing activities that you could do before. You need to use medicines or oxygen more frequently or in higher dosages than usual. Get help right away if: You have severe shortness of breath. You have chest pain or pressure. You cough up blood. You have swelling of your feet or legs that gets worse. You have rapid weight gain over a period of 1-2 days. Your medicines or oxygen do not provide relief. These symptoms may represent a serious problem that is an emergency. Do not wait to see if the symptoms will go away. Get medical help right away. Call your local emergency services (911 in the [place] Do not  drive yourself to the hospital. Summary Pulmonary hypertension is a long-term (chronic) condition in which there is high blood pressure in the blood vessels of the lungs (pulmonary arteries). Pulmonary hypertension is a serious condition that can be life-threatening. It can be caused by a variety of illnesses. Treatment may involve taking medicines and using oxygen therapy. Severe cases may require surgery or a transplant. This information is not intended to replace advice given to you by your health care provider. Make sure you discuss any questions you have with your health care provider. Document Revised: 11/04/2019 Document Reviewed: 11/04/2019 Elsevier Patient Education  [place].

## 2021-05-26 NOTE — Progress Notes (Signed)
$'@Patient'l$  ID: Mandy [name], female    DOB: 07/17/1940, 81 y.o.   MRN: [id number]  Chief Complaint  Patient presents with   Hospitalization Follow-up    She was in the hospital and she has increased shortness of breath with exertion.    Referring provider: [name], MD  HPI: 81 year old female, never smoked.  Past medical history significant for chronic diastolic heart failure, hypertension, pulmonary hypertension, chronic respiratory failure with hypoxia, GERD, type 2 diabetes, hyperthyroidism, class 1 obesity.  05/26/2021 Patient was admitted from 5/5 - 5/10 for heart failure and pulmonary hypertension.  She was not able to get CPAP.  She represented to ED on 5/20 with worsening shortness of breath and weight gain.  She initially required noninvasive mechanical ventilation for respiratory distress.  She was eventually weaned to high flow nasal cannula.  She was admitted for a second time from 5/20-5/23 for acute on chronic diastolic heart failure.  Chest x-ray showed cardiomegaly, bilateral interstitial infiltrates predominant lower lobes and right upper lobe, small bilateral pleural effusion.  Patient was diuresed with IV furosemide.  At time of discharge oxygen was 100% on room air.  Plan diuresis with torsemide at home.  Patient to continue heart failure management with carvedilol, hydralazine and empagliflozin.  Dr. Silas Flood recommended stopping to tadalafil and Opsumit and restarting patient on sildenafil for pulmonary hypertension group 1 and 2.  Patient presents today for hospital follow-up. Patient reports that her breathing is better. She was discharged yesterday. Daughter feels she looks better as well. She does get short winded with exertion, using 2L at rest and increasing to 3L on exertion. Her weight was 152 lbs today on home scale. Leg swelling is baseline per daughter. She is compliant with Torsemide '40mg'$  twice daily, needs new prescription. She also needs RX sildenafil sent  to specialty pharmacy, they have medication at home.   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     albuterol (PROVENTIL) (2.5 MG/3ML) 0.083% nebulizer solution Take 2.5 mg by nebulization every 6 (six) hours as needed for wheezing or shortness of breath.     albuterol (VENTOLIN HFA) 108 (90 Base) MCG/ACT inhaler Inhale 2 puffs into the lungs every 6 (six) hours as needed for wheezing or shortness of breath. 18 g 0   alendronate (FOSAMAX) 70 MG tablet Take 70 mg by mouth once a  week.     atorvastatin (LIPITOR) 80 MG tablet Take 1 tablet (80 mg total) by mouth every evening. 30 tablet 1   calcium carbonate (OSCAL) 1500 (600 Ca) MG TABS tablet Take 600 mg of elemental calcium by mouth 2 (two) times daily with a meal.     carvedilol (COREG) 12.5 MG tablet Take 1 tablet (12.5 mg total) by mouth 2 (two) times daily with a meal. 180 tablet 1   cetirizine (ZYRTEC) 10 MG  tablet Take 10 mg by mouth daily.     clopidogrel (PLAVIX) 75 MG tablet Take 1 tablet (75 mg total) by mouth daily. 30 tablet 2   dapagliflozin propanediol (FARXIGA) 10 MG TABS tablet Take 1 tablet (10 mg total) by mouth daily. 30 tablet 0   diclofenac sodium (VOLTAREN) 1 % GEL Apply 4 g topically 4 (four) times daily as needed for pain. shoulder     feeding supplement (ENSURE ENLIVE / ENSURE PLUS) LIQD Take 237 mLs by mouth 2 (two) times daily between meals. 14220 mL 0   fluticasone (FLONASE) 50 MCG/ACT nasal spray Place 1 spray into both nostrils daily. (Patient taking differently: Place 1 spray into both nostrils daily as needed for allergies.) 1 mL 2   gabapentin (NEURONTIN) 100 MG capsule Take 100 mg by mouth at bedtime.     guaiFENesin (MUCINEX) 600 MG 12 hr tablet Take 600 mg by mouth 2 (two) times daily as needed.     hydrALAZINE (APRESOLINE) 100 MG tablet Take 50 mg by mouth 3 (three) times daily.     melatonin 5 MG TABS Take 10 mg by mouth at bedtime.     methimazole (TAPAZOLE) 5 MG tablet Take 0.5 tablets (2.5 mg total) by mouth daily. 30 tablet 0   Multiple Vitamin (MULTIVITAMIN WITH MINERALS) TABS tablet Take 1 tablet by mouth every morning.     multivitamin (RENA-VIT) TABS tablet Take 1 tablet by mouth at bedtime.  0   Omega-3 Fatty Acids (FISH OIL PO) Take 1 capsule by mouth every morning.     OXYGEN Inhale 4 L into the lungs continuous.     pantoprazole (PROTONIX) 40 MG tablet Take 1 tablet (40 mg total) by mouth daily at 6 (six) AM. (Patient taking differently: Take 40 mg by mouth every morning.) 30 tablet 0   potassium chloride SA (KLOR-CON M) 20 MEQ tablet Take 1 tablet (20 mEq total) by mouth daily. 30 tablet 1   sildenafil (REVATIO) 20 MG tablet Take 1 tablet (20 mg total) by mouth 3 (three) times daily. 10 tablet 0   vitamin B-12 (CYANOCOBALAMIN) 1000 MCG tablet Take 1,000 mcg by mouth daily.     vitamin C (ASCORBIC ACID) 500 MG tablet Take 500 mg by mouth daily.     Vitamin  D, Cholecalciferol, 25 MCG (1000 UT) TABS Take 1,000 Units by mouth daily.     zinc gluconate 50 MG tablet Take 50 mg by mouth daily.     Torsemide 40 MG TABS Take 40 mg by mouth 2 (two) times daily. 60 tablet 1   No facility-administered medications prior to visit.   Review of Systems  Review of Systems  Constitutional: Negative.   HENT: Negative.    Respiratory:  Positive for shortness of breath. Negative for cough, chest tightness and wheezing.        Dyspnea with exertion   Cardiovascular:  Negative for leg swelling.  Psychiatric/Behavioral: Negative.      Physical Exam  BP 130/82 (BP Location: Left  Arm, Cuff Size: Large)   Pulse 68   Temp 98.4 F (36.9 C) (Oral)   Ht '4\' 11"'$  (1.499 m)   Wt 156 lb 12.8 oz (71.1 kg)   SpO2 98%   BMI 31.67 kg/m  Physical Exam Constitutional:      Appearance: Normal appearance.  HENT:     Head: Normocephalic and atraumatic.     Mouth/Throat:     Mouth: Mucous membranes are moist.     Pharynx: Oropharynx is clear.  Cardiovascular:     Rate and Rhythm: Normal rate and regular rhythm.     Comments: Trace- 1+ pedal edema  Pulmonary:     Effort: Pulmonary effort is normal.     Breath sounds: Rales present. No wheezing or rhonchi.  Musculoskeletal:        General: Normal range of motion.  Skin:    General: Skin is warm and dry.  Neurological:     General: No focal deficit present.     Mental Status: She is alert and oriented to person, place, and time. Mental status is at baseline.  Psychiatric:        Mood and Affect: Mood normal.        Behavior: Behavior normal.        Thought Content: Thought content normal.        Judgment: Judgment normal.     Lab Results:  CBC    Component Value Date/Time   WBC 6.0 05/23/2021 0107   RBC 3.61 (L) 05/23/2021 0107   HGB 8.9 (L) 05/23/2021 0107   HCT 28.3 (L) 05/23/2021 0107   HCT 33.2 (L) 02/03/2019 1220   PLT 262 05/23/2021 0107   MCV 78.4 (L) 05/23/2021 0107   MCH 24.7 (L)  05/23/2021 0107   MCHC 31.4 05/23/2021 0107   RDW 18.2 (H) 05/23/2021 0107   LYMPHSABS 1.2 02/18/2021 0253   MONOABS 0.8 02/18/2021 0253   EOSABS 0.1 02/18/2021 0253   BASOSABS 0.0 02/18/2021 0253    BMET    Component Value Date/Time   NA 138 05/25/2021 0113   NA 142 04/16/2021 1536   K 4.1 05/25/2021 0113   CL 93 (L) 05/25/2021 0113   CO2 36 (H) 05/25/2021 0113   GLUCOSE 95 05/25/2021 0113   BUN 22 05/25/2021 0113   BUN 41 (H) 04/16/2021 1536   CREATININE 1.31 (H) 05/25/2021 0113   CREATININE 1.19 (H) 04/01/2021 0000   CALCIUM 8.7 (L) 05/25/2021 0113   GFRNONAA 41 (L) 05/25/2021 0113   GFRNONAA 43 (L) 07/07/2020 0000   GFRAA 50 (L) 07/07/2020 0000    BNP    Component Value Date/Time   BNP 176.8 (H) 05/22/2021 0733    ProBNP    Component Value Date/Time   PROBNP 95.0 03/31/2021 1601    Imaging: DG Chest 2 View  Result Date: 05/07/2021 CLINICAL DATA:  Shortness of breath, severe pulmonary hypertension, history of pleural effusions, diabetes mellitus, hypertension, stage III A chronic kidney disease, stroke EXAM: CHEST - 2 VIEW COMPARISON:  02/18/2021 FINDINGS: Enlargement of cardiac silhouette with pulmonary vascular congestion. Atherosclerotic calcification aorta. Perihilar interstitial infiltrates likely pulmonary edema. Bibasilar effusions and atelectasis greater on RIGHT. No pneumothorax or acute osseous findings. IMPRESSION: Enlargement of cardiac silhouette with pulmonary vascular congestion and suspected edema/CHF. Bibasilar effusions and atelectasis greater on RIGHT. Electronically Signed   By: Lavonia Dana M.D.   On: 05/07/2021 14:55   CT Angio Chest PE W and/or Wo Contrast  Result Date: 05/07/2021 CLINICAL DATA:  Suspected pulmonary embolism. EXAM: CT ANGIOGRAPHY CHEST WITH CONTRAST TECHNIQUE: Multidetector CT imaging of the chest was performed using the standard protocol during bolus administration of intravenous contrast. Multiplanar CT image reconstructions and  MIPs were obtained to evaluate the vascular anatomy. RADIATION DOSE REDUCTION: This exam was performed according to the departmental dose-optimization program which includes automated exposure control, adjustment of the mA and/or kV according to patient size and/or use of iterative reconstruction technique. CONTRAST:  6m OMNIPAQUE IOHEXOL 350 MG/ML SOLN COMPARISON:  Chest x-ray 05/07/2021. CT chest 12/14/2020. FINDINGS: Cardiovascular: Satisfactory opacification of the pulmonary arteries to the segmental level. No evidence of pulmonary embolism. Heart is enlarged. No pericardial effusion. There are atherosclerotic calcifications of the aorta. The main pulmonary artery is enlarged compatible with pulmonary artery hypertension similar to the prior study. Mediastinum/Nodes: There is enlarged precarinal lymph node measuring 12 mm short axis, slightly increased in size compared to the prior study. There is an enlarged right hilar lymph node measuring 15 mm short axis, likely new from prior. There is an enlarged subcarinal lymph node measuring 14 mm short axis which is also new. Lungs/Pleura: Small to moderate-sized right pleural effusion appears loculated in several places, but has slightly decreased in size. Small left pleural effusion appears stable. There is atelectasis and airspace disease in the right lower lobe and right middle lobe. Multifocal ground-glass opacities are seen throughout the mid and upper lungs with some smooth interlobular septal thickening similar to the prior study. There is a band of atelectasis in the right upper lobe. No evidence for pneumothorax. Upper Abdomen: No acute abnormality. Musculoskeletal: Degenerative changes affect the spine. No acute fractures. Review of the MIP images confirms the above findings. IMPRESSION: 1. No evidence for pulmonary embolism. 2. Small to moderate-sized right pleural effusion has decreased in size, but is now loculated in several places throughout the  hemithorax. Infection not excluded. 3. Stable small left pleural effusion. 4. Right middle lobe and right lower lobe airspace disease. 5. New mediastinal lymphadenopathy. 6. Ground-glass in interlobular septal thickening in both mid and upper lungs may represent pulmonary edema. Infection not excluded. 7. Stable cardiomegaly with enlargement of the main pulmonary artery compatible with pulmonary artery hypertension. Aortic Atherosclerosis (ICD10-I70.0). Electronically Signed   By: ARonney AstersM.D.   On: 05/07/2021 20:47   CT Chest High Resolution  Result Date: 05/10/2021 CLINICAL DATA:  Chronic shortness of breath, tracheobronchomalacia. EXAM: CT CHEST WITHOUT CONTRAST TECHNIQUE: Multidetector CT imaging of the chest was performed following the standard protocol without intravenous contrast. High resolution imaging of the lungs, as well as inspiratory and expiratory imaging, was performed. RADIATION DOSE REDUCTION: This exam was performed according to the departmental dose-optimization program which includes automated exposure control, adjustment of the mA and/or kV according to patient size and/or use of iterative reconstruction technique. COMPARISON:  05/07/2021 and 12/14/2020. FINDINGS: Cardiovascular: Atherosclerotic calcification of the aorta, aortic valve and coronary arteries. Enlarged pulmonic trunk and heart. No pericardial effusion. Mediastinum/Nodes: Mediastinal lymph nodes measure up to 12 mm in the low right paratracheal station, similar. Hilar regions are difficult to evaluate without IV contrast. No axillary adenopathy. Esophagus is grossly unremarkable. Lungs/Pleura: Added density in the lungs is likely due to expiratory phase imaging. Mild septal thickening. Large loculated right pleural effusion. Small loculated left pleural effusion. Right lower lobe collapse/consolidation. On expiratory phase imaging there is slightly greater than 50% collapse of the trachea. Air trapping is seen as well.  Upper Abdomen: Visualized portions of the liver and right adrenal  gland are unremarkable. Slight thickening of the left adrenal gland. No specific follow-up necessary. Mild residual parenchymal hyperattenuation in the kidneys may be due to recent contrast administration. Visualized portions of the spleen, pancreas, stomach and bowel are grossly unremarkable. Musculoskeletal: Degenerative changes in the spine. Old right rib fractures. No worrisome lytic or sclerotic lesions. IMPRESSION: 1. Excessive airway collapse on expiration, compatible with tracheobronchomalacia. 2. Large loculated right pleural effusion with collapse/consolidation in the right lower lobe, worrisome for pneumonia. 3. Small loculated left pleural effusion. 4. Mild septal thickening, suggesting pulmonary edema. 5. Minimally enlarged mediastinal lymph nodes, likely reactive. 6. Aortic atherosclerosis (ICD10-I70.0). Coronary artery calcification. 7. Enlarged pulmonic trunk, indicative of pulmonary arterial hypertension. Electronically Signed   By: Lorin Picket M.D.   On: 05/10/2021 16:30   DG CHEST PORT 1 VIEW  Result Date: 05/23/2021 CLINICAL DATA:  Acute exacerbation of CHF. EXAM: PORTABLE CHEST 1 VIEW COMPARISON:  Chest radiograph dated 05/22/2021. FINDINGS: Cardiomegaly with vascular congestion and edema 10 similar to prior radiograph. Small bilateral pleural effusions and bibasilar atelectasis or infiltrate. No pneumothorax. Atherosclerotic calcification of the aorta. No acute osseous pathology. IMPRESSION: 1. Cardiomegaly with findings of CHF, similar to prior radiograph. 2. Small bilateral pleural effusions and bibasilar atelectasis or infiltrate. Electronically Signed   By: Anner Crete M.D.   On: 05/23/2021 20:22   DG Chest Portable 1 View  Result Date: 05/22/2021 CLINICAL DATA:  81 year old female with history of shortness of breath. EXAM: PORTABLE CHEST 1 VIEW COMPARISON:  Chest x-ray 05/07/2021. FINDINGS: Lung volumes are  low. Extensive bibasilar opacities (right greater than left) which may reflect areas of atelectasis and/or consolidation, with superimposed enlarging moderate to large right and moderate left pleural effusions. Patchy areas of interstitial prominence are noted throughout the mid lungs bilaterally and in the right upper lobe. No pneumothorax. Pulmonary vasculature appears engorged. Heart size is moderately enlarged. Upper mediastinal contours are distorted by patient positioning. Atherosclerotic calcifications in the thoracic aorta. IMPRESSION: 1. The appearance of the chest remains most concerning for congestive heart failure, as above. 2. Aortic atherosclerosis. Electronically Signed   By: Vinnie Langton M.D.   On: 05/22/2021 07:32   ECHOCARDIOGRAM COMPLETE  Result Date: 05/08/2021    ECHOCARDIOGRAM REPORT   Patient Name:   MORENE CECILIO Date of Exam: 05/08/2021 Medical Rec #:  [id number]      Height:       59.0 in Accession #:    [id number]     Weight:       156.7 lb Date of Birth:  11-29-1940       BSA:          1.663 m Patient Age:    89 years       BP:           178/63 mmHg Patient Gender: F              HR:           95 bpm. Exam Location:  Inpatient Procedure: 2D Echo, 3D Echo, Cardiac Doppler and Color Doppler Indications:    I50.40* Unspecified combined systolic (congestive) and diastolic                 (congestive) heart failure  History:        Patient has prior history of Echocardiogram examinations, most                 recent 02/15/2020. CHF, Previous Myocardial Infarction, Pulmonary  HTN and Stroke, Signs/Symptoms:Dizziness/Lightheadedness; Risk                 Factors:Hypertension, Diabetes and Dyslipidemia. Pleural                 effusion.  Sonographer:    Roseanna Rainbow RDCS Referring Phys: [place]  1. Left ventricular ejection fraction, by estimation, is 55 to 60%. The left ventricle has normal function. The left ventricle has no regional wall motion  abnormalities. There is severe left ventricular hypertrophy. Left ventricular diastolic parameters  are consistent with Grade II diastolic dysfunction (pseudonormalization). Elevated left atrial pressure.  2. Right ventricular systolic function is moderately reduced. The right ventricular size is mildly enlarged. There is severely elevated pulmonary artery systolic pressure.  3. A small pericardial effusion is present. Moderate pleural effusion in the right lateral region.  4. The mitral valve is normal in structure. Mild mitral valve regurgitation. No evidence of mitral stenosis.  5. Tricuspid valve regurgitation is moderate.  6. The aortic valve is tricuspid. Aortic valve regurgitation is not visualized. No aortic stenosis is present.  7. The inferior vena cava is dilated in size with >50% respiratory variability, suggesting right atrial pressure of 8 mmHg. FINDINGS  Left Ventricle: Left ventricular ejection fraction, by estimation, is 55 to 60%. The left ventricle has normal function. The left ventricle has no regional wall motion abnormalities. The left ventricular internal cavity size was normal in size. There is  severe left ventricular hypertrophy. Left ventricular diastolic parameters are consistent with Grade II diastolic dysfunction (pseudonormalization). Elevated left atrial pressure. Right Ventricle: The right ventricular size is mildly enlarged. Right ventricular systolic function is moderately reduced. There is severely elevated pulmonary artery systolic pressure. The tricuspid regurgitant velocity is 3.86 m/s, and with an assumed right atrial pressure of 8 mmHg, the estimated right ventricular systolic pressure is 71.2 mmHg. Left Atrium: Left atrial size was normal in size. Right Atrium: Right atrial size was normal in size. Pericardium: A small pericardial effusion is present. Mitral Valve: The mitral valve is normal in structure. Mild mitral valve regurgitation. No evidence of mitral valve stenosis.  Tricuspid Valve: The tricuspid valve is normal in structure. Tricuspid valve regurgitation is moderate . No evidence of tricuspid stenosis. Aortic Valve: The aortic valve is tricuspid. Aortic valve regurgitation is not visualized. No aortic stenosis is present. Pulmonic Valve: The pulmonic valve was normal in structure. Pulmonic valve regurgitation is trivial. No evidence of pulmonic stenosis. Aorta: The aortic root is normal in size and structure. Venous: The inferior vena cava is dilated in size with greater than 50% respiratory variability, suggesting right atrial pressure of 8 mmHg. IAS/Shunts: The interatrial septum is aneurysmal. No atrial level shunt detected by color flow Doppler. Additional Comments: There is a moderate pleural effusion in the right lateral region.  LEFT VENTRICLE PLAX 2D LVIDd:         4.60 cm     Diastology LVIDs:         2.80 cm     LV e' medial:    5.77 cm/s LV PW:         1.80 cm     LV E/e' medial:  19.3 LV IVS:        1.50 cm     LV e' lateral:   7.51 cm/s LVOT diam:     2.30 cm     LV E/e' lateral: 14.8 LV SV:  88 LV SV Index:   53 LVOT Area:     4.15 cm                             3D Volume EF: LV Volumes (MOD)           3D EF:        59 % LV vol d, MOD A2C: 78.5 ml LV EDV:       101 ml LV vol d, MOD A4C: 71.0 ml LV ESV:       42 ml LV vol s, MOD A2C: 30.2 ml LV SV:        60 ml LV vol s, MOD A4C: 22.6 ml LV SV MOD A2C:     48.3 ml LV SV MOD A4C:     71.0 ml LV SV MOD BP:      52.7 ml RIGHT VENTRICLE             IVC RV S prime:     10.10 cm/s  IVC diam: 2.30 cm TAPSE (M-mode): 1.4 cm LEFT ATRIUM             Index        RIGHT ATRIUM           Index LA diam:        2.50 cm 1.50 cm/m   RA Area:     13.80 cm LA Vol (A2C):   45.4 ml 27.30 ml/m  RA Volume:   26.60 ml  16.00 ml/m LA Vol (A4C):   60.4 ml 36.32 ml/m LA Biplane Vol: 56.7 ml 34.10 ml/m  AORTIC VALVE             PULMONIC VALVE LVOT Vmax:   106.00 cm/s PR End Diast Vel: 1.78 msec LVOT Vmean:  75.300 cm/s LVOT  VTI:    0.211 m  AORTA Ao Root diam: 2.90 cm Ao Asc diam:  3.20 cm MITRAL VALVE                TRICUSPID VALVE MV Area (PHT): 4.26 cm     TR Peak grad:   59.6 mmHg MV Decel Time: 178 msec     TR Vmax:        386.00 cm/s MV E velocity: 111.50 cm/s MV A velocity: 107.50 cm/s  SHUNTS MV E/A ratio:  1.04         Systemic VTI:  0.21 m                             Systemic Diam: 2.30 cm Kirk Ruths MD Electronically signed by Kirk Ruths MD Signature Date/Time: 05/08/2021/1:41:30 PM    Final      Assessment & Plan:   Pulmonary HTN ([place]) - Group 1 and 2. Admitted twice in May 2023 for HF and Pulm HTN. Opsumit and Tadalafil stopped during most recent admission by Dr. Silas Flood. Restarted on Sildenafil (Revatio) '20mg'$  three times daily. Sending refill to specialty pharamacy  Acute on chronic diastolic CHF (congestive heart failure) (HCC) -  Appears euvolemic on exam today  - Continue diuresis with torsemide '40mg'$  twice daily (Rx provided) - Continue carvedilol, hydralazine and empagliflozin  - Continue to monitor daily weight  (use the same scale and and weight at same time of day) - Notify cardiology or Dr. Silas Flood any rapid weight gain, increased leg swelling or worsening shortness of breath  Chronic respiratory failure with hypoxia, on home oxygen therapy (HCC) - Continue to wear 2L oxygen at rest and 4L with exertion    Martyn Ehrich, NP 05/26/2021

## 2021-05-26 NOTE — Telephone Encounter (Signed)
Patient was restarted on Sildenafil, does this get sent to specialty pharmacy?

## 2021-05-26 NOTE — Assessment & Plan Note (Signed)
-   Continue to wear 2L oxygen at rest and 4L with exertion

## 2021-05-26 NOTE — Assessment & Plan Note (Addendum)
-   Group 1 and 2. Admitted twice in May 2023 for HF and Pulm HTN. Opsumit and Tadalafil stopped during most recent admission by Dr. Silas Flood. Restarted on Sildenafil (Revatio) '20mg'$  three times daily. Sending refill to specialty pharamacy

## 2021-05-27 MED ORDER — SILDENAFIL CITRATE 20 MG PO TABS: 20.0000 mg | ORAL_TABLET | Freq: Three times a day (TID) | ORAL | 5 refills | Status: DC

## 2021-05-27 NOTE — Telephone Encounter (Signed)
Called and spoke with pt's daughter [name] to verify preferred pharmacy and have sent med to  pharmacy for pt. Nothing further needed.

## 2021-05-27 NOTE — Telephone Encounter (Signed)
Thanks.  Raquel Sarna can you please send refill for sildenafil to which ever pharmacy patient prefers

## 2021-06-01 ENCOUNTER — Other Ambulatory Visit (HOSPITAL_COMMUNITY): Payer: Self-pay

## 2021-06-01 ENCOUNTER — Ambulatory Visit: Payer: Medicare HMO | Admitting: Pulmonary Disease

## 2021-06-01 ENCOUNTER — Telehealth: Payer: Self-pay | Admitting: Pharmacist

## 2021-06-01 DIAGNOSIS — I272 Pulmonary hypertension, unspecified: Secondary | ICD-10-CM

## 2021-06-01 MED ORDER — SILDENAFIL CITRATE 20 MG PO TABS: 20.0000 mg | ORAL_TABLET | Freq: Three times a day (TID) | ORAL | 5 refills | Status: DC

## 2021-06-01 NOTE — Telephone Encounter (Signed)
Submitted a Prior Authorization request to [facility] for  sildenafil [place]  via CoverMyMeds. Will update once we receive a response.  Once PA approved, will need to change qty to 90 tabs per 30 days as rx was sent for 30 tabs (which is only 10 day supply)  Key: [id number]  Knox Saliva, PharmD, MPH, BCPS, CPP Clinical Pharmacist (Rheumatology and Pulmonology)

## 2021-06-01 NOTE — Telephone Encounter (Signed)
Received notification from [facility] regarding a prior authorization for  sildenafil PAH . Authorization has been APPROVED from 06/01/21 to 01/02/22.   Per test claim, copay for 30 days supply is $0  Patient can fill through  [facility] # [id number]  Rx resent for correct qty for 30 day supply  Knox Saliva, PharmD, MPH, BCPS, CPP Clinical Pharmacist (Rheumatology and Pulmonology)

## 2021-06-06 NOTE — Progress Notes (Incomplete)
HEART & VASCULAR TRANSITION OF CARE CONSULT NOTE     Referring Physician: Primary Care: Primary Cardiologist:  HPI: Referred to clinic by *** for heart failure consultation. 81 y.o. 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   She has been followed by Pulmonology (Dr. Silas Flood) for pulmonary hypertension. Multiple admissions over the last 6 months with a/c respiratory failure and volume overload. 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 Negative V/Q scan.   She was admitted from January 31 through February 22, 2021 with respiratory failure, acute on chronic diastolic heart failure and RV failure.  She required milrinone during the admission and was started on sildenafil.   She's had recurrent pleural effusions/right hydropneumothorax and has required thoracentesis on multiple occasions.   Hospitalized 05/05-05/12/23 with volume overload. Most recent admission 05/22/21. CXR with pulmonary edema and bilateral effusions. Pulmonary consulted. High suspicion Opsumit contributing to increased preload in setting of severe diastolic dysfunction. Opsumit and Tadalafil stopped, started on sildenafil 20 TID. Also treated for PNA and discharged with CPAP.   Cardiac Testing    Review of Systems: [y] = yes, '[ ]'$  = no   General: Weight gain '[ ]'$ ; Weight loss '[ ]'$ ; Anorexia '[ ]'$ ; Fatigue '[ ]'$ ; Fever '[ ]'$ ; Chills '[ ]'$ ; Weakness '[ ]'$   Cardiac: Chest pain/pressure '[ ]'$ ; Resting SOB '[ ]'$ ; Exertional SOB '[ ]'$ ; Orthopnea '[ ]'$ ; Pedal Edema '[ ]'$ ; Palpitations '[ ]'$ ; Syncope '[ ]'$ ; Presyncope '[ ]'$ ; Paroxysmal nocturnal dyspnea'[ ]'$   Pulmonary: Cough '[ ]'$ ; Wheezing'[ ]'$ ; Hemoptysis'[ ]'$ ; Sputum '[ ]'$ ; Snoring '[ ]'$   GI: Vomiting'[ ]'$ ; Dysphagia'[ ]'$ ; Melena'[ ]'$ ; Hematochezia '[ ]'$ ;  Heartburn'[ ]'$ ; Abdominal pain '[ ]'$ ; Constipation '[ ]'$ ; Diarrhea '[ ]'$ ; BRBPR '[ ]'$   GU: Hematuria'[ ]'$ ; Dysuria '[ ]'$ ; Nocturia'[ ]'$   Vascular: Pain in legs with walking '[ ]'$ ; Pain in feet with lying flat '[ ]'$ ; Non-healing sores '[ ]'$ ; Stroke '[ ]'$ ; TIA '[ ]'$ ; Slurred speech '[ ]'$ ;  Neuro: Headaches'[ ]'$ ; Vertigo'[ ]'$ ; Seizures'[ ]'$ ; Paresthesias'[ ]'$ ;Blurred vision '[ ]'$ ; Diplopia '[ ]'$ ; Vision changes '[ ]'$   Ortho/Skin: Arthritis '[ ]'$ ; Joint pain '[ ]'$ ; Muscle pain '[ ]'$ ; Joint swelling '[ ]'$ ; Back Pain '[ ]'$ ; Rash '[ ]'$   Psych: Depression'[ ]'$ ; Anxiety'[ ]'$   Heme: Bleeding problems '[ ]'$ ; Clotting disorders '[ ]'$ ; Anemia '[ ]'$   Endocrine: Diabetes '[ ]'$ ; Thyroid dysfunction'[ ]'$    Past Medical History:  Diagnosis Date   Aortic atherosclerosis ([place]) 10/08/2018   Arthritis    Chronic kidney disease, stage 3a ([place])    Chronic respiratory failure (HCC)    Diabetes mellitus without complication (HCC)    GERD (gastroesophageal reflux disease)    Hepatic steatosis 10/08/2018   Hyperlipidemia    Hypertension    Hyperthyroidism    Mass of right ovary 10/08/2018   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     albuterol (PROVENTIL) (2.5 MG/3ML) 0.083% nebulizer solution Take 2.5 mg by nebulization every 6 (six) hours as needed for wheezing or shortness of breath.     albuterol (VENTOLIN HFA) 108 (90 Base) MCG/ACT inhaler Inhale 2 puffs into the lungs every 6 (six) hours as needed for wheezing or shortness of breath. 18 g 0   alendronate (FOSAMAX) 70 MG tablet Take 70 mg by mouth once a week.     atorvastatin (LIPITOR) 80 MG tablet Take 1 tablet (80 mg total) by  mouth every evening. 30 tablet 1   calcium carbonate (OSCAL) 1500 (600 Ca) MG TABS tablet Take 600 mg of elemental calcium by mouth 2 (two) times daily with a meal.     carvedilol (COREG) 12.5 MG tablet Take 1 tablet (12.5 mg total) by mouth 2 (two) times daily with a meal. 180 tablet 1   cetirizine (ZYRTEC) 10 MG tablet Take 10 mg by mouth daily.     clopidogrel (PLAVIX) 75 MG tablet Take 1 tablet (75 mg total) by mouth daily. 30 tablet 2   dapagliflozin propanediol (FARXIGA) 10 MG TABS tablet Take 1 tablet (10 mg total) by mouth daily. 30 tablet 0   diclofenac sodium (VOLTAREN) 1 % GEL Apply 4 g topically 4 (four) times daily as needed for pain. shoulder     feeding supplement (ENSURE ENLIVE / ENSURE PLUS) LIQD Take 237 mLs by mouth 2 (two) times daily between meals. 14220 mL 0   fluticasone (FLONASE) 50 MCG/ACT nasal spray Place 1 spray into both nostrils daily. (Patient taking differently: Place 1 spray into both nostrils daily as needed for allergies.) 1 mL 2   gabapentin (NEURONTIN) 100 MG capsule Take 100 mg by mouth at bedtime.     guaiFENesin (MUCINEX) 600 MG 12 hr tablet Take 600 mg by mouth 2 (two) times daily as needed.     hydrALAZINE (APRESOLINE) 100 MG tablet Take 50 mg by mouth 3 (three) times daily.     melatonin 5 MG TABS Take 10 mg by mouth at bedtime.     methimazole (TAPAZOLE) 5 MG tablet Take 0.5 tablets (2.5 mg total) by mouth daily. 30 tablet 0   Multiple Vitamin (MULTIVITAMIN WITH MINERALS) TABS tablet Take 1 tablet by mouth every morning.     multivitamin (RENA-VIT) TABS tablet Take 1 tablet by mouth at bedtime.  0   Omega-3 Fatty Acids (FISH OIL PO) Take 1 capsule by mouth every morning.     OXYGEN Inhale 4 L into the lungs continuous.     pantoprazole (PROTONIX) 40 MG tablet Take 1 tablet (40 mg total) by mouth daily at 6 (six) AM. (Patient taking differently: Take 40 mg by mouth every morning.) 30 tablet 0   potassium chloride SA (KLOR-CON M) 20 MEQ tablet Take 1  tablet (20 mEq total) by mouth daily. 30 tablet 1   sildenafil (REVATIO) 20 MG tablet Take 1 tablet (20 mg total) by mouth 3 (three) times daily. 90 tablet 5   Torsemide 40 MG TABS Take 40 mg by mouth 2 (two) times daily. 60 tablet 1   vitamin B-12 (CYANOCOBALAMIN) 1000 MCG tablet Take 1,000 mcg by mouth daily.     vitamin C (ASCORBIC ACID) 500 MG tablet Take 500 mg by mouth daily.     Vitamin D, Cholecalciferol, 25 MCG (1000 UT) TABS Take 1,000 Units by mouth daily.     zinc gluconate 50 MG tablet Take 50 mg  by mouth daily.     No current facility-administered medications for this visit.    No Known Allergies    Social History   Socioeconomic History   Marital status: Widowed    Spouse name: Not on file   Number of children: Not on file   Years of education: Not on file   Highest education level: Not on file  Occupational History   Not on file  Tobacco Use   Smoking status: Never   Smokeless tobacco: Never  Vaping Use   Vaping Use: Never used  Substance and Sexual Activity   Alcohol use: No   Drug use: No   Sexual activity: Not on file  Other Topics Concern   Not on file  Social History Narrative   Patient is from [place]   Speaks Krio   Social Determinants of Health   Financial Resource Strain: Not on file  Food Insecurity: Not on file  Transportation Needs: Not on file  Physical Activity: Not on file  Stress: Not on file  Social Connections: Not on file  Intimate Partner Violence: Not on file      Family History  Problem Relation Age of Onset   Kidney disease Son    Colon cancer Neg Hx    Breast cancer Neg Hx     There were no vitals filed for this visit.  PHYSICAL EXAM: General:  Well appearing. No respiratory difficulty HEENT: normal Neck: supple. no JVD. Carotids 2+ bilat; no bruits. No lymphadenopathy or thryomegaly appreciated. Cor: PMI nondisplaced. Regular rate & rhythm. No rubs, gallops or murmurs. Lungs: clear Abdomen: soft,  nontender, nondistended. No hepatosplenomegaly. No bruits or masses. Good bowel sounds. Extremities: no cyanosis, clubbing, rash, edema Neuro: alert & oriented x 3, cranial nerves grossly intact. moves all 4 extremities w/o difficulty. Affect pleasant.  ECG:   ASSESSMENT & PLAN: Chronic diastolic CHF with RV failure:  2. Pulmonary hypertension:  3. CKD IIIa:  4. DM II:  5. HTN:  NYHA *** GDMT  Diuretic- BB- Ace/ARB/ARNI MRA SGLT2i    Referred to HFSW (PCP, Medications, Transportation, ETOH Abuse, Drug Abuse, Insurance, Financial ): Yes or No Refer to Pharmacy: Yes or No Refer to Home Health: Yes on No Refer to Advanced Heart Failure Clinic: Yes or no  Refer to General Cardiology: Yes or No  Follow up

## 2021-06-07 ENCOUNTER — Encounter (HOSPITAL_COMMUNITY): Payer: Self-pay

## 2021-06-07 ENCOUNTER — Telehealth (HOSPITAL_COMMUNITY): Payer: Self-pay

## 2021-06-07 ENCOUNTER — Telehealth: Payer: Self-pay | Admitting: Internal Medicine

## 2021-06-07 ENCOUNTER — Telehealth (HOSPITAL_COMMUNITY): Payer: Self-pay | Admitting: *Deleted

## 2021-06-07 ENCOUNTER — Ambulatory Visit (HOSPITAL_COMMUNITY)
Admit: 2021-06-07 | Discharge: 2021-06-07 | Disposition: A | Discharge status: Home / Self Care | Payer: Medicare HMO | Attending: Physician Assistant | Admitting: Physician Assistant

## 2021-06-07 VITALS — BP 144/90 | HR 98 | Wt 152.8 lb

## 2021-06-07 DIAGNOSIS — Z7902 Long term (current) use of antithrombotics/antiplatelets: Secondary | ICD-10-CM | POA: Insufficient documentation

## 2021-06-07 DIAGNOSIS — I2510 Atherosclerotic heart disease of native coronary artery without angina pectoris: Secondary | ICD-10-CM | POA: Insufficient documentation

## 2021-06-07 DIAGNOSIS — N1831 Chronic kidney disease, stage 3a: Secondary | ICD-10-CM | POA: Insufficient documentation

## 2021-06-07 DIAGNOSIS — I10 Essential (primary) hypertension: Secondary | ICD-10-CM | POA: Diagnosis not present

## 2021-06-07 DIAGNOSIS — I272 Pulmonary hypertension, unspecified: Secondary | ICD-10-CM | POA: Diagnosis not present

## 2021-06-07 DIAGNOSIS — J9611 Chronic respiratory failure with hypoxia: Secondary | ICD-10-CM | POA: Insufficient documentation

## 2021-06-07 DIAGNOSIS — Z8673 Personal history of transient ischemic attack (TIA), and cerebral infarction without residual deficits: Secondary | ICD-10-CM | POA: Insufficient documentation

## 2021-06-07 DIAGNOSIS — E1122 Type 2 diabetes mellitus with diabetic chronic kidney disease: Secondary | ICD-10-CM | POA: Insufficient documentation

## 2021-06-07 DIAGNOSIS — E785 Hyperlipidemia, unspecified: Secondary | ICD-10-CM | POA: Insufficient documentation

## 2021-06-07 DIAGNOSIS — I130 Hypertensive heart and chronic kidney disease with heart failure and stage 1 through stage 4 chronic kidney disease, or unspecified chronic kidney disease: Secondary | ICD-10-CM | POA: Insufficient documentation

## 2021-06-07 DIAGNOSIS — I5032 Chronic diastolic (congestive) heart failure: Secondary | ICD-10-CM | POA: Insufficient documentation

## 2021-06-07 DIAGNOSIS — R42 Dizziness and giddiness: Secondary | ICD-10-CM | POA: Diagnosis not present

## 2021-06-07 DIAGNOSIS — Z79899 Other long term (current) drug therapy: Secondary | ICD-10-CM | POA: Insufficient documentation

## 2021-06-07 LAB — BASIC METABOLIC PANEL
Anion gap: 9 (ref 5–15)
BUN: 21 mg/dL (ref 8–23)
CO2: 33 mmol/L — ABNORMAL HIGH (ref 22–32)
Calcium: 9 mg/dL (ref 8.9–10.3)
Chloride: 97 mmol/L — ABNORMAL LOW (ref 98–111)
Creatinine, Ser: 1.27 mg/dL — ABNORMAL HIGH (ref 0.44–1.00)
GFR, Estimated: 43 mL/min — ABNORMAL LOW (ref >60)
Glucose, Bld: 119 mg/dL — ABNORMAL HIGH (ref 70–99)
Potassium: 2.9 mmol/L — ABNORMAL LOW (ref 3.5–5.1)
Sodium: 139 mmol/L (ref 135–145)

## 2021-06-07 LAB — BRAIN NATRIURETIC PEPTIDE: B Natriuretic Peptide: 72.9 pg/mL (ref 0.0–100.0)

## 2021-06-07 MED ORDER — POTASSIUM CHLORIDE CRYS ER 20 MEQ PO TBCR: 40.0000 meq | EXTENDED_RELEASE_TABLET | Freq: Every day | ORAL | 1 refills | Status: DC

## 2021-06-07 NOTE — Telephone Encounter (Signed)
Call attempted to confirm HV TOC appt 10 am on 06/07/21. HIPPA appropriate VM left with callback number.    Earnestine Leys, BSN, Clinical cytogeneticist Only

## 2021-06-07 NOTE — Telephone Encounter (Signed)
Patient advised and verbalized understanding,lab appointment scheduled,lab orders entered, med list updated to reflect changes.   Orders Placed This Encounter  Procedures   NM CARDIAC AMYLOID TUMOR LOC INFLAM SPECT 1 DAY    Standing Status:   Future    Standing Expiration Date:   06/08/2022    Order Specific Question:   If indicated for the ordered procedure, I authorize the administration of a radiopharmaceutical per Radiology protocol    Answer:   Yes    Order Specific Question:   Preferred imaging location?    Answer:   [facility]    Order Specific Question:   Radiology Contrast Protocol - do NOT remove file path    Answer:   [email or web address]   Basic metabolic panel    Standing Status:   Future    Standing Expiration Date:   06/08/2022    Order Specific Question:   Release to patient    Answer:   Immediate   Multiple Myeloma Panel (SPEP&IFE w/QIG)    Standing Status:   Future    Standing Expiration Date:   06/08/2022    Order Specific Question:   Release to patient    Answer:   Immediate   Meds ordered this encounter  Medications   potassium chloride SA (KLOR-CON M) 20 MEQ tablet    Sig: Take 2 tablets (40 mEq total) by mouth daily.    Dispense:  60 tablet    Refill:  1    Please cancel all previous orders for current medication. Change in dosage or pill size.

## 2021-06-07 NOTE — Patient Instructions (Signed)
It was great to see you today! No medication changes are needed at this time.  Labs today We will only contact you if something comes back abnormal or we need to make some changes. Otherwise no news is good news!  Your physician has recommended that you have a pulmonary function test. Pulmonary Function Tests are a group of tests that measure how well air moves in and out of your lungs.  Your physician has recommended that you have a sleep study. This test records several body functions during sleep, including: brain activity, eye movement, oxygen and carbon dioxide blood levels, heart rate and rhythm, breathing rate and rhythm, the flow of air through your mouth and nose, snoring, body muscle movements, and chest and belly movement.  Your physician recommends that you schedule a follow-up appointment in: 6-8 weeks with Dr Haroldine Laws  At the [facility], you and your health needs are our priority. As part of our continuing mission to provide you with exceptional heart care, we have created designated Provider Care Teams. These Care Teams include your primary Cardiologist (physician) and Advanced Practice Providers (APPs- Physician Assistants and Nurse Practitioners) who all work together to provide you with the care you need, when you need it.   You may see any of the following providers on your designated Care Team at your next follow up: Dr Glori Bickers Dr Haynes Kerns, NP Lyda Jester, [place] [facility] [place], [place] Audry Riles, PharmD   Please be sure to bring in all your medications bottles to every appointment.   If you have any questions or concerns before your next appointment please send [place] a message through [place] or call our office at [phone].    TO LEAVE A MESSAGE FOR THE NURSE SELECT OPTION 2, PLEASE LEAVE A MESSAGE INCLUDING: YOUR NAME DATE OF BIRTH CALL BACK NUMBER REASON FOR CALL**this is important as we prioritize  the call backs  YOU WILL RECEIVE A CALL BACK THE SAME DAY AS LONG AS YOU CALL BEFORE 4:00 PM

## 2021-06-07 NOTE — Telephone Encounter (Signed)
-----   Message from Joette Catching, [place] sent at 06/07/2021  1:48 PM EDT ----- BNP now normal. Kidney function stable but potassium very low at 2.9. Med list states she is taking 20 mEq K daily. Take an extra 60 mEq this afternoon then increase to 40 mEq daily starting tomorrow. Repeat BMET on 06/07.  Looked at her echoes closer. Heart muscle is thick. Recommend PYP scan and myeloma panel to rule out sarcoid.

## 2021-06-07 NOTE — Progress Notes (Signed)
Heart and Vascular Care Navigation  06/07/2021  Mandy [name] 04-22-1940 [id number]  Reason for Referral: Patient seen in HF TOC.   Engaged with patient face to face for initial visit for Heart and Vascular Care Coordination.                                                                                                   Assessment:  Patient is an 81 yo female who speaks Krio. Patient resides with her Daughter and two grandsons.  Patient's daughter states  she works night shift and her sons are home with patient at that time.  Patient has Mandy [name] for home O2 needs and daughter reports she has had Mandy [name] for home health but due to upcoming move she would like to hold off on services.  Daughter reports patient has no income but does have Medicare and Medicaid benefits. Daughter denies any concerns with food insecurity or transportation. She states monies can be tight but they manage well.   Daughter also stated that they are looking for new housing as there current landlord has given them a 30 day move out notice. Daughter states she is struggling to find some options that are comparable to her current home.                         HRT/VAS Care Coordination     Patients Home Cardiology Office Heart Failure Clinic  HF [facility] [place]   Outpatient Care Team Social Worker   Social Worker Name: Raquel Sarna, [place] [phone]   Living arrangements for the past 2 months Single Family Home   Lives with: Adult Children; Self  Patient lives with her Daughter and two Mandy [name] (50yo and 64yo)   Patient Current Insurance Coverage Managed Medicare; Medicaid   Patient Has Concern With Paying Medical Bills No   Does Patient Have Prescription Coverage? Yes   Home Assistive Devices/Equipment Walker (specify type); Scales; Oxygen   DME [place]  Patient is [facility] with [facility]   Current home services Home PT       Social History:                                                                              SDOH Screenings   Alcohol Screen: Not on file  Depression (PHQ2-9): Not on file  Financial Resource Strain: Medium Risk   Difficulty of Paying Living Expenses: Somewhat hard  Food Insecurity: No Food Insecurity   Worried About Running Out of Food in the Last Year: Never true   Ran Out of Food in the Last Year: Never true  Housing: Medium Risk   Last Housing Risk Score: 1  Physical Activity: Not on file  Social Connections: Not on file  Stress: Not on file  Tobacco Use: Low Risk    Smoking Tobacco Use: Never   Smokeless Tobacco Use: Never   Passive Exposure: Not on file  Transportation Needs: No Transportation Needs   Lack of Transportation (Medical): No   Lack of Transportation (Non-Medical): No    SDOH Interventions: Financial Resources:  Sales promotion account executive Interventions: Intervention Not Indicated   Food Insecurity:  Food Insecurity Interventions: Intervention Not Indicated  Housing Insecurity:  Housing Interventions: Other (Comment) (Patient and family will have to move in one month.)  Transportation:    Reviewed Payor Benefit if needed   Follow-up plan:  CSW contacted [facility] Lemmings to confirm services and was informed patient is scheduled for a visit tomorrow from [facility]. CSW provided daughter with a list of housing options through the [place] further follow up. CSW continues to be available as needed. Raquel Sarna, [place], [place]

## 2021-06-08 ENCOUNTER — Ambulatory Visit (HOSPITAL_COMMUNITY)
Admission: RE | Admit: 2021-06-08 | Discharge: 2021-06-08 | Disposition: A | Discharge status: Home / Self Care | Payer: Medicare HMO | Source: Ambulatory Visit | Attending: Cardiology | Admitting: Cardiology

## 2021-06-08 DIAGNOSIS — I272 Pulmonary hypertension, unspecified: Secondary | ICD-10-CM | POA: Insufficient documentation

## 2021-06-08 DIAGNOSIS — I5032 Chronic diastolic (congestive) heart failure: Secondary | ICD-10-CM | POA: Diagnosis not present

## 2021-06-08 LAB — BASIC METABOLIC PANEL
Anion gap: 8 (ref 5–15)
BUN: 27 mg/dL — ABNORMAL HIGH (ref 8–23)
CO2: 34 mmol/L — ABNORMAL HIGH (ref 22–32)
Calcium: 9.3 mg/dL (ref 8.9–10.3)
Chloride: 99 mmol/L (ref 98–111)
Creatinine, Ser: 1.52 mg/dL — ABNORMAL HIGH (ref 0.44–1.00)
GFR, Estimated: 34 mL/min — ABNORMAL LOW (ref >60)
Glucose, Bld: 177 mg/dL — ABNORMAL HIGH (ref 70–99)
Potassium: 3.7 mmol/L (ref 3.5–5.1)
Sodium: 141 mmol/L (ref 135–145)

## 2021-06-08 NOTE — Telephone Encounter (Signed)
Tried calling heartcare and line just rings, no option to leave msg, WCB.

## 2021-06-09 ENCOUNTER — Other Ambulatory Visit (HOSPITAL_COMMUNITY): Payer: Medicare HMO

## 2021-06-10 ENCOUNTER — Inpatient Hospital Stay (HOSPITAL_COMMUNITY): Admission: RE | Admit: 2021-06-10 | Payer: Medicare HMO | Source: Ambulatory Visit

## 2021-06-15 ENCOUNTER — Other Ambulatory Visit: Payer: Self-pay | Admitting: Internal Medicine

## 2021-06-15 DIAGNOSIS — I5032 Chronic diastolic (congestive) heart failure: Secondary | ICD-10-CM | POA: Diagnosis not present

## 2021-06-15 DIAGNOSIS — E1140 Type 2 diabetes mellitus with diabetic neuropathy, unspecified: Secondary | ICD-10-CM | POA: Diagnosis not present

## 2021-06-15 DIAGNOSIS — I10 Essential (primary) hypertension: Secondary | ICD-10-CM | POA: Diagnosis not present

## 2021-06-15 DIAGNOSIS — J9601 Acute respiratory failure with hypoxia: Secondary | ICD-10-CM | POA: Diagnosis not present

## 2021-06-16 LAB — CBC
HCT: 32 % — ABNORMAL LOW (ref 35.0–45.0)
Hemoglobin: 9.9 g/dL — ABNORMAL LOW (ref 11.7–15.5)
MCH: 24.5 pg — ABNORMAL LOW (ref 27.0–33.0)
MCHC: 30.9 g/dL — ABNORMAL LOW (ref 32.0–36.0)
MCV: 79.2 fL — ABNORMAL LOW (ref 80.0–100.0)
MPV: 11.7 fL (ref 7.5–12.5)
Platelets: 310 10*3/uL (ref 140–400)
RBC: 4.04 10*6/uL (ref 3.80–5.10)
RDW: 15.2 % — ABNORMAL HIGH (ref 11.0–15.0)
WBC: 4.2 10*3/uL (ref 3.8–10.8)

## 2021-06-16 LAB — BASIC METABOLIC PANEL WITH GFR
BUN/Creatinine Ratio: 19 (calc) (ref 6–22)
BUN: 28 mg/dL — ABNORMAL HIGH (ref 7–25)
CO2: 31 mmol/L (ref 20–32)
Calcium: 9.2 mg/dL (ref 8.6–10.4)
Chloride: 99 mmol/L (ref 98–110)
Creat: 1.49 mg/dL — ABNORMAL HIGH (ref 0.60–0.95)
Glucose, Bld: 108 mg/dL — ABNORMAL HIGH (ref 65–99)
Potassium: 4.5 mmol/L (ref 3.5–5.3)
Sodium: 140 mmol/L (ref 135–146)
eGFR: 35 mL/min/{1.73_m2} — ABNORMAL LOW (ref >=60)

## 2021-06-16 LAB — MAGNESIUM: Magnesium: 2.6 mg/dL — ABNORMAL HIGH (ref 1.5–2.5)

## 2021-06-17 ENCOUNTER — Ambulatory Visit (HOSPITAL_COMMUNITY)
Admission: RE | Admit: 2021-06-17 | Discharge: 2021-06-17 | Disposition: A | Discharge status: Home / Self Care | Payer: Medicare HMO | Source: Ambulatory Visit | Attending: Physician Assistant | Admitting: Physician Assistant

## 2021-06-17 DIAGNOSIS — I272 Pulmonary hypertension, unspecified: Secondary | ICD-10-CM | POA: Insufficient documentation

## 2021-06-17 LAB — PULMONARY FUNCTION TEST
DL/VA % pred: 52 %
DL/VA: 2.2 ml/min/mmHg/L
DLCO unc % pred: 43 %
DLCO unc: 6.85 ml/min/mmHg
FEF 25-75 Post: 0.82 L/sec
FEF 25-75 Pre: 0.72 L/sec
FEF2575-%Change-Post: 15 %
FEF2575-%Pred-Post: 84 %
FEF2575-%Pred-Pre: 73 %
FEV1-%Change-Post: 9 %
FEV1-%Pred-Post: 62 %
FEV1-%Pred-Pre: 57 %
FEV1-Post: 0.71 L
FEV1-Pre: 0.65 L
FEV1FVC-%Change-Post: 5 %
FEV1FVC-%Pred-Pre: 109 %
FEV6-%Change-Post: 3 %
FEV6-%Pred-Post: 58 %
FEV6-%Pred-Pre: 56 %
FEV6-Post: 0.81 L
FEV6-Pre: 0.78 L
FEV6FVC-%Pred-Post: 106 %
FEV6FVC-%Pred-Pre: 106 %
FVC-%Change-Post: 3 %
FVC-%Pred-Post: 54 %
FVC-%Pred-Pre: 52 %
FVC-Post: 0.81 L
FVC-Pre: 0.78 L
Post FEV1/FVC ratio: 87 %
Post FEV6/FVC ratio: 100 %
Pre FEV1/FVC ratio: 82 %
Pre FEV6/FVC Ratio: 100 %
RV % pred: 149 %
RV: 3.2 L
TLC % pred: 93 %
TLC: 4.02 L

## 2021-06-17 MED ORDER — ALBUTEROL SULFATE (2.5 MG/3ML) 0.083% IN NEBU
2.5000 mg | INHALATION_SOLUTION | Freq: Once | RESPIRATORY_TRACT | Status: AC
  Administered 2021-06-17: 2.5 mg via RESPIRATORY_TRACT

## 2021-06-18 NOTE — Telephone Encounter (Signed)
ATC heart failure clinic at [place] on triage voicemail looks like there was an order placed on 06/07/21 for a nocturnal polysomnography.

## 2021-07-08 ENCOUNTER — Telehealth: Payer: Self-pay | Admitting: Primary Care

## 2021-07-08 ENCOUNTER — Ambulatory Visit (INDEPENDENT_AMBULATORY_CARE_PROVIDER_SITE_OTHER): Payer: Medicare HMO

## 2021-07-08 ENCOUNTER — Ambulatory Visit (INDEPENDENT_AMBULATORY_CARE_PROVIDER_SITE_OTHER): Payer: Medicare HMO | Admitting: Primary Care

## 2021-07-08 ENCOUNTER — Encounter: Payer: Self-pay | Admitting: Primary Care

## 2021-07-08 VITALS — BP 136/82 | HR 72 | Temp 98.1°F | Ht 60.0 in | Wt 152.0 lb

## 2021-07-08 DIAGNOSIS — J90 Pleural effusion, not elsewhere classified: Secondary | ICD-10-CM

## 2021-07-08 DIAGNOSIS — R0683 Snoring: Secondary | ICD-10-CM | POA: Diagnosis not present

## 2021-07-08 DIAGNOSIS — I110 Hypertensive heart disease with heart failure: Secondary | ICD-10-CM

## 2021-07-08 DIAGNOSIS — J9611 Chronic respiratory failure with hypoxia: Secondary | ICD-10-CM

## 2021-07-08 DIAGNOSIS — I272 Pulmonary hypertension, unspecified: Secondary | ICD-10-CM | POA: Diagnosis not present

## 2021-07-08 DIAGNOSIS — Z9981 Dependence on supplemental oxygen: Secondary | ICD-10-CM

## 2021-07-08 DIAGNOSIS — I5032 Chronic diastolic (congestive) heart failure: Secondary | ICD-10-CM | POA: Diagnosis not present

## 2021-07-08 NOTE — Assessment & Plan Note (Signed)
-   Following with cardiology - Maintained on torsemide 40 mg twice daily, Coreg 12.5 mg twice daily, Farxiga '10mg'$  daily

## 2021-07-08 NOTE — Telephone Encounter (Signed)
Called and spoke with a representative from [employer] regarding the medication Opsumit.  The patient had contacted them to them them know she was no longer taking the medication and they wanted to confirm with our office that was true before closing out the referral.  Advised I did not see the medication listed on her medication list.  Isabella Bowens with pharmacy a secure chat and she verified that Dr. Silas Flood had recommended that she stop taking the medication.  Nothing further needed.

## 2021-07-08 NOTE — Assessment & Plan Note (Addendum)
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 Last admission in May 2023, Opsumit and Tadalafil stopped. No evidenced of COPD on pulmonary function testing in June 2023. No ILD on CT imaging.  - Continue sildenafil 20 mg three times a day  - Continue supplemental oxygen  - NEEDS in-lab split night sleep study to assess for underlying OSA d.t tracheobronchomalacia   - Follow-up with Dr. Silas Flood in 4-6 weeks

## 2021-07-08 NOTE — Progress Notes (Signed)
$'@Patient'o$  ID: Mandy [name], female    DOB: 02-Nov-1940, 81 y.o.   MRN: [id number]  Chief Complaint  Patient presents with   Follow-up    Coughing at time, SOB with ambulation     Referring provider: [name], MD  HPI: 81 year old female, never smoked.  Past medical history significant for chronic diastolic heart failure, hypertension, pulmonary hypertension, chronic respiratory failure with hypoxia, GERD, type 2 diabetes, hyperthyroidism, class 1 obesity.  Previous [facility] pulmonary encounter:  05/26/2021 Patient was admitted from 5/5 - 5/10 for heart failure and pulmonary hypertension.  She was not able to get CPAP.  She represented to ED on 5/20 with worsening shortness of breath and weight gain.  She initially required noninvasive mechanical ventilation for respiratory distress.  She was eventually weaned to high flow nasal cannula.  She was admitted for a second time from 5/20-5/23 for acute on chronic diastolic heart failure.  Chest x-ray showed cardiomegaly, bilateral interstitial infiltrates predominant lower lobes and right upper lobe, small bilateral pleural effusion.  Patient was diuresed with IV furosemide.  At time of discharge oxygen was 100% on room air.  Plan diuresis with torsemide at home.  Patient to continue heart failure management with carvedilol, hydralazine and empagliflozin.  Dr. Silas Flood recommended stopping to tadalafil and Opsumit and restarting patient on sildenafil for pulmonary hypertension group 1 and 2.  Patient presents today for hospital follow-up. Patient reports that her breathing is better. She was discharged yesterday. Daughter feels she looks better as well. She does get short winded with exertion, using 2L at rest and increasing to 3L on exertion. Her weight was 152 lbs today on home scale. Leg swelling is baseline per daughter. She is compliant with Torsemide '40mg'$  twice daily, needs new prescription. She also needs RX sildenafil sent to specialty pharmacy,  they have medication at home.     07/08/2021- interim hx  Patient presents today for 6 week follow-up. She is doing well today, no acute complaints. She has an occasional cough and dyspnea with exertion Maintained on Torsemide '40mg'$  BID, carvedilol 12.'5mg'$  BID, hydralazine '50mg'$  TID. Not recommended to add ARNI or MRA d.t orthostatic dizziness and advanced age.  Continues Sildenafil '20mg'$  TID  Continue supplemental oxygen 2L at rest and 3L with exertion. Sleeps with oxygen.  She has never had a sleep study. Not currently on CPAP.  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  Pulmonary function testing in June 2023 showed moderate restrictive lung disease and moderate- severe diffusion defect.    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     albuterol (PROVENTIL) (2.5 MG/3ML) 0.083% nebulizer  solution Take 2.5 mg by nebulization every 6 (six) hours as needed for wheezing or shortness of breath.     albuterol (VENTOLIN HFA) 108 (90 Base) MCG/ACT inhaler Inhale 2 puffs into the lungs every 6 (six) hours as needed for wheezing or shortness of breath. 18 g 0   alendronate (FOSAMAX) 70 MG tablet Take 70 mg by mouth once a week.     atorvastatin (LIPITOR) 80 MG tablet Take 1 tablet (80 mg total) by mouth every evening. 30 tablet 1   calcium carbonate (OSCAL) 1500 (600 Ca) MG TABS tablet Take 600 mg of elemental calcium by mouth 2 (two) times daily with a meal.     carvedilol (COREG) 12.5 MG tablet Take 1 tablet (12.5 mg total) by mouth 2 (two) times daily with a meal. 180 tablet 1   cetirizine (ZYRTEC) 10 MG tablet Take 10 mg by mouth daily.     clopidogrel (PLAVIX) 75 MG tablet Take 1 tablet (75 mg total) by mouth daily. 30 tablet 2   diclofenac sodium (VOLTAREN) 1 % GEL Apply 4 g topically 4 (four) times daily as needed for pain. shoulder     fluticasone (FLONASE) 50 MCG/ACT nasal spray Place 1 spray into both nostrils daily. (Patient taking differently: Place 1 spray into both nostrils daily as needed for allergies.) 1 mL 2   gabapentin (NEURONTIN) 100 MG capsule Take 100 mg by mouth at bedtime.     guaiFENesin (MUCINEX) 600 MG 12 hr tablet Take 600 mg by mouth 2 (two) times daily as needed.     hydrALAZINE (APRESOLINE) 100 MG tablet Take 50 mg by mouth 3 (three) times daily.     melatonin 5 MG TABS Take 10 mg by mouth at bedtime.     methimazole (TAPAZOLE) 5 MG tablet Take 0.5 tablets (2.5 mg total) by mouth daily. 30 tablet 0   Multiple Vitamin (MULTIVITAMIN WITH MINERALS) TABS tablet Take 1 tablet by mouth every morning.     Omega-3 Fatty Acids (FISH OIL PO) Take 1 capsule by mouth every morning.     OXYGEN Inhale 4 L into the lungs continuous.     pantoprazole (PROTONIX) 40 MG tablet Take 1 tablet (40 mg total) by mouth daily at 6 (six) AM. (Patient taking differently: Take 40 mg by  mouth every morning.) 30 tablet 0   potassium chloride SA (KLOR-CON M) 20 MEQ tablet Take 2 tablets (40 mEq total) by mouth daily. 60 tablet 1   sildenafil (REVATIO) 20 MG tablet Take 1 tablet (20 mg total) by mouth 3 (three) times daily. 90 tablet 5   Torsemide 40 MG TABS Take 40 mg by mouth 2 (two) times daily. 60 tablet 1   vitamin B-12 (CYANOCOBALAMIN) 1000 MCG tablet Take 1,000 mcg by mouth daily.     vitamin C (ASCORBIC ACID) 500 MG tablet Take 500 mg by mouth daily.     Vitamin D, Cholecalciferol, 25 MCG (1000 UT) TABS Take 1,000 Units by mouth daily.     zinc gluconate 50 MG tablet Take 50 mg by mouth daily.     No facility-administered medications prior to visit.   Review of Systems  Review of  Systems  Constitutional: Negative.   HENT: Negative.    Respiratory:  Positive for cough and shortness of breath. Negative for chest tightness and wheezing.   Cardiovascular:  Negative for leg swelling.   Physical Exam  BP 136/82 (BP Location: Right Arm, Patient Position: Sitting, Cuff Size: Large)   Pulse 72   Temp 98.1 F (36.7 C) (Oral)   Ht 5' (1.524 m)   Wt 152 lb (68.9 kg)   SpO2 100%   BMI 29.69 kg/m  Physical Exam Constitutional:      General: She is not in acute distress.    Appearance: Normal appearance.  HENT:     Head: Normocephalic and atraumatic.  Cardiovascular:     Rate and Rhythm: Normal rate and regular rhythm.     Comments: No edema Pulmonary:     Effort: Pulmonary effort is normal. No respiratory distress.     Breath sounds: Normal breath sounds. No stridor. No wheezing, rhonchi or rales.     Comments: 2L oxygen at rest Musculoskeletal:        General: Normal range of motion.  Skin:    General: Skin is warm and dry.  Neurological:     General: No focal deficit present.     Mental Status: She is alert and oriented to person, place, and time. Mental status is at baseline.      Lab Results:  CBC    Component Value Date/Time   WBC 4.2 06/15/2021  1540   RBC 4.04 06/15/2021 1540   HGB 9.9 (L) 06/15/2021 1540   HCT 32.0 (L) 06/15/2021 1540   HCT 33.2 (L) 02/03/2019 1220   PLT 310 06/15/2021 1540   MCV 79.2 (L) 06/15/2021 1540   MCH 24.5 (L) 06/15/2021 1540   MCHC 30.9 (L) 06/15/2021 1540   RDW 15.2 (H) 06/15/2021 1540   LYMPHSABS 1.2 02/18/2021 0253   MONOABS 0.8 02/18/2021 0253   EOSABS 0.1 02/18/2021 0253   BASOSABS 0.0 02/18/2021 0253    BMET    Component Value Date/Time   NA 140 06/15/2021 1540   NA 142 04/16/2021 1536   K 4.5 06/15/2021 1540   CL 99 06/15/2021 1540   CO2 31 06/15/2021 1540   GLUCOSE 108 (H) 06/15/2021 1540   BUN 28 (H) 06/15/2021 1540   BUN 41 (H) 04/16/2021 1536   CREATININE 1.49 (H) 06/15/2021 1540   CALCIUM 9.2 06/15/2021 1540   GFRNONAA 34 (L) 06/08/2021 0921   GFRNONAA 43 (L) 07/07/2020 0000   GFRAA 50 (L) 07/07/2020 0000    BNP    Component Value Date/Time   BNP 72.9 06/07/2021 1037    ProBNP    Component Value Date/Time   PROBNP 95.0 03/31/2021 1601    Imaging: No results found.   Assessment & Plan:   Pulmonary HTN ([place]) Suspected combination Group 1,2 & 3. Last admission in May 2023, Opsumit and Tadalafil stopped. No evidenced of COPD on pulmonary function testing in June 2023. No ILD on CT imaging.  - Continue sildenafil 20 mg three times a day  - Continue supplemental oxygen  - NEEDS in-lab split night sleep study to assess for underlying OSA d.t tracheobronchomalacia   - Follow-up with Dr. Silas Flood in 4-6 weeks   Chronic respiratory failure with hypoxia, on home oxygen therapy ([place]) - Continue 2L oxygen at rest and 4L with exertion   Chronic diastolic CHF (congestive heart failure) ([place]) - Following with cardiology - Maintained on torsemide 40 mg twice daily, Coreg 12.5 mg  twice daily, Farxiga '10mg'$  daily    Martyn Ehrich, NP 07/08/2021

## 2021-07-08 NOTE — Patient Instructions (Addendum)
Pulmonary function testing showed moderate restrictive lung disease with diffusion defect. No bronchodilator response. Diffusion defect can be seen with anemia. No evidence of ILD on CT imaging. No evidence of asthma or COPD  Pulmonary Hypertension Continue sildenafil 20 mg three times a day  Continue supplemental oxygen - wear 2L at rest (3L with exertion) AND at bedtime   Orders: In-lab split night sleep study re: snoring/ tracheobronchomalacia   Follow-up: Must see Dr. Silas Flood next visit/ recommend 4-6 weeks

## 2021-07-08 NOTE — Assessment & Plan Note (Signed)
-   Continue 2L oxygen at rest and 4L with exertion

## 2021-07-08 NOTE — Telephone Encounter (Signed)
Ivin Booty from [facility] called to check on the status of patient's treatment. Ivin Booty knew patient had an appointment today and wants to know if patient is continuing a certain medication.   Please advise- call back number is [phone] and fax number is [phone].

## 2021-07-09 ENCOUNTER — Ambulatory Visit (INDEPENDENT_AMBULATORY_CARE_PROVIDER_SITE_OTHER): Payer: Medicare HMO | Admitting: Physician Assistant

## 2021-07-09 ENCOUNTER — Encounter: Payer: Self-pay | Admitting: Physician Assistant

## 2021-07-09 VITALS — BP 149/78 | HR 73 | Ht 60.0 in | Wt 152.0 lb

## 2021-07-09 DIAGNOSIS — J9611 Chronic respiratory failure with hypoxia: Secondary | ICD-10-CM

## 2021-07-09 DIAGNOSIS — I272 Pulmonary hypertension, unspecified: Secondary | ICD-10-CM | POA: Diagnosis not present

## 2021-07-09 DIAGNOSIS — Z8673 Personal history of transient ischemic attack (TIA), and cerebral infarction without residual deficits: Secondary | ICD-10-CM

## 2021-07-09 DIAGNOSIS — I2510 Atherosclerotic heart disease of native coronary artery without angina pectoris: Secondary | ICD-10-CM | POA: Diagnosis not present

## 2021-07-09 DIAGNOSIS — I5032 Chronic diastolic (congestive) heart failure: Secondary | ICD-10-CM | POA: Diagnosis not present

## 2021-07-09 DIAGNOSIS — Z9981 Dependence on supplemental oxygen: Secondary | ICD-10-CM | POA: Diagnosis not present

## 2021-07-09 DIAGNOSIS — I10 Essential (primary) hypertension: Secondary | ICD-10-CM

## 2021-07-09 DIAGNOSIS — E119 Type 2 diabetes mellitus without complications: Secondary | ICD-10-CM

## 2021-07-09 NOTE — Progress Notes (Signed)
Cardiology Office Note:    Date:  07/11/2021   ID:  Shanielle Correll, DOB 1940/11/18, MRN [id number]  PCP:  [name], MD   [facility] Providers Cardiologist:  Donato Heinz, MD     Referring MD: [name], MD   Chief Complaint  Patient presents with   Follow-up    Seen for Dr. Gardiner Rhyme    History of Present Illness:    Raneem Mendolia is a 81 y.o. 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  Left and right heart cath performed in November 2022 showed nonobstructive CAD, moderate pulmonary hypertension.  Patient has had multiple admissions for acute on chronic respiratory failure.  Patient was admitted in February 2023 with acute on chronic hypoxic/hypercapnic respiratory failure.  Severe pulmonary hypertension, acute on chronic diastolic heart failure with RV failure.  She required milrinone with transition to sildenafil.  She underwent diuresis and ultimately discharged on torsemide.  Most recent echocardiogram obtained on 02/15/2021 showed EF 55 to 60%, moderate LVH, severely elevated pulmonary systolic pressure, moderate TR.  Patient was hospitalized again in May 2023 for acute on chronic respiratory failure with hypoxia and hypercapnia.  She presented with increasing dyspnea on exertion.  Patient was treated with IV Lasix along with antibiotic.  Patient was last seen by Fabian Sharp on 05/14/2021 at which time she has gained 3 pounds from the previous 158 pounds.  Her daughter had increased her torsemide to 80 mg twice a day.  She was advised to keep on the higher dose of torsemide until she see pulmonology service.  Unfortunately, she was shortly admitted after her last appointment due to increased dyspnea in the setting of diastolic  heart failure and pulmonary hypertension.  She underwent IV diuresis and was discharged again on 40 mg twice a day of torsemide. Her Opsumit was stopped and the patient was started on sildenafil 20 mg 3 times a day.  Since discharge, patient has been seen on 06/07/2021 by heart failure TOC clinic.  It was recommended to check myeloma panel and PYP scan to rule out infiltrative heart disease.  Patient was not placed on ACE inhibitor, ARB or ARNI due to orthostatic dizziness.  BNP was low at 72.9.  Blood work showed low potassium, patient was instructed to take extra dose of potassium 60 mg that day and increase daily potassium to 40 mEq.  Creatinine was 1.29.  PFT showed moderate to severe restriction and severe diffusion defect.  Patient presents today along with her daughter.  Her breathing has improved.  Majority of the history has been obtained from her daughter who also acted as Optometrist.  Her blood pressure is borderline elevated today, however previously has been normal.  She does not have any significant lower extremity edema, orthopnea or PND.  Her weight is very much stable.  She has upcoming follow-up with heart failure service.  She may follow-up with Dr. 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/w benign thyroid tissue   Mild CAD    Osteoporosis    Pneumonia    Renal infarct (HCC)    SBO (small bowel obstruction) ([place]) 08/09/2018   Severe pulmonary hypertension ([place])    Stroke (cerebrum) ([place])     Past Surgical History:  Procedure Laterality Date   CATARACT EXTRACTION W/  INTRAOCULAR LENS  IMPLANT, BILATERAL     COLONOSCOPY     LEFT HEART CATH AND CORONARY ANGIOGRAPHY N/A 11/30/2020   Procedure: LEFT HEART CATH AND CORONARY ANGIOGRAPHY;  Surgeon: Early Osmond, MD;  Location: [facility];  Service: Cardiovascular;  Laterality: N/A;   RIGHT/LEFT HEART CATH AND CORONARY ANGIOGRAPHY N/A 11/30/2020   Procedure: RIGHT/LEFT HEART CATH AND CORONARY ANGIOGRAPHY;  Surgeon: Early Osmond, MD;  Location: [facility];  Service: Cardiovascular;  Laterality: N/A;    Current Medications: Current Meds  Medication Sig   acetaminophen (TYLENOL) 500 MG tablet Take 1,000 mg by mouth every 6 (six) hours as needed for fever or headache (pain).   albuterol (PROVENTIL) (2.5 MG/3ML) 0.083% nebulizer solution Take 2.5 mg by nebulization every 6 (six) hours as needed for wheezing or shortness of breath.   albuterol (VENTOLIN HFA) 108 (90 Base) MCG/ACT inhaler Inhale 2 puffs into the lungs every 6 (six) hours as needed for wheezing or shortness of breath.   alendronate (FOSAMAX) 70 MG tablet Take 70 mg by mouth once a week.   atorvastatin (LIPITOR) 80 MG tablet Take 1 tablet (80 mg total) by mouth every evening.   calcium carbonate (OSCAL) 1500 (600 Ca) MG TABS tablet Take 600 mg of elemental calcium by mouth 2 (two) times daily with a meal.   carvedilol (COREG) 12.5 MG tablet Take 1 tablet (12.5 mg total) by mouth 2 (two) times daily with a meal.   cetirizine (ZYRTEC) 10 MG tablet Take 10 mg by mouth daily.   clopidogrel (PLAVIX) 75 MG tablet Take 1 tablet (75 mg total) by mouth daily.   diclofenac sodium (VOLTAREN) 1 % GEL Apply 4 g topically 4 (four) times daily as needed for pain. shoulder   fluticasone (FLONASE) 50 MCG/ACT nasal spray Place 1 spray into both nostrils daily. (Patient taking differently: Place 1 spray into both nostrils daily as needed for allergies.)   gabapentin (NEURONTIN) 100 MG capsule Take 100 mg by mouth at bedtime.   guaiFENesin (MUCINEX)  600 MG 12 hr tablet Take 600 mg by mouth 2 (two) times daily as needed.   hydrALAZINE (APRESOLINE) 100 MG tablet Take 50 mg by mouth 3 (three) times daily.   melatonin 5 MG TABS Take 10 mg by mouth at bedtime.   methimazole (TAPAZOLE) 5 MG tablet Take 0.5 tablets (2.5 mg total) by mouth daily.   Multiple Vitamin (MULTIVITAMIN WITH MINERALS) TABS tablet Take 1 tablet by mouth every morning.   Omega-3 Fatty Acids (FISH OIL PO) Take 1 capsule by mouth every morning.   OXYGEN Inhale 4 L into the lungs continuous.   pantoprazole (PROTONIX) 40 MG tablet Take 1 tablet (40 mg total) by mouth daily at 6 (six) AM. (Patient taking differently: Take 40 mg by mouth every morning.)   potassium chloride SA (KLOR-CON M) 20 MEQ tablet Take 2 tablets (40 mEq total) by mouth daily.   sildenafil (REVATIO) 20 MG tablet Take 1 tablet (20 mg total) by mouth 3 (three) times daily.   Torsemide 40 MG  TABS Take 40 mg by mouth 2 (two) times daily.   vitamin B-12 (CYANOCOBALAMIN) 1000 MCG tablet Take 1,000 mcg by mouth daily.   vitamin C (ASCORBIC ACID) 500 MG tablet Take 500 mg by mouth daily.   Vitamin D, Cholecalciferol, 25 MCG (1000 UT) TABS Take 1,000 Units by mouth daily.   zinc gluconate 50 MG tablet Take 50 mg by mouth daily.     Allergies:   Patient has no known allergies.   Social History   Socioeconomic History   Marital status: Widowed    Spouse name: Not on file   Number of children: Not on file   Years of education: Not on file   Highest education level: Not on file  Occupational History   Not on file  Tobacco Use   Smoking status: Never   Smokeless tobacco: Never  Vaping Use   Vaping Use: Never used  Substance and Sexual Activity   Alcohol use: No   Drug use: No   Sexual activity: Not on file  Other Topics Concern   Not on file  Social History Narrative   Patient is from [place]   Speaks Krio   Social Determinants of Health   Financial Resource Strain: Medium Risk (06/07/2021)    Overall Financial Resource Strain (CARDIA)    Difficulty of Paying Living Expenses: Somewhat hard  Food Insecurity: No Food Insecurity (06/07/2021)   Hunger Vital Sign    Worried About Running Out of Food in the Last Year: Never true    Ran Out of Food in the Last Year: Never true  Transportation Needs: No Transportation Needs (06/07/2021)   PRAPARE - Hydrologist (Medical): No    Lack of Transportation (Non-Medical): No  Physical Activity: Not on file  Stress: Not on file  Social Connections: Not on file     Family History: The patient's family history includes Kidney disease in her son. There is no history of Colon cancer or Breast cancer.  ROS:   Please see the history of present illness.     All other systems reviewed and are negative.  EKGs/Labs/Other Studies Reviewed:    The following studies were reviewed today:  Echo 05/08/2021 1. Left ventricular ejection fraction, by estimation, is 55 to 60%. The  left ventricle has normal function. The left ventricle has no regional  wall motion abnormalities. There is severe left ventricular hypertrophy.  Left ventricular diastolic parameters   are consistent with Grade II diastolic dysfunction (pseudonormalization).  Elevated left atrial pressure.   2. Right ventricular systolic function is moderately reduced. The right  ventricular size is mildly enlarged. There is severely elevated pulmonary  artery systolic pressure.   3. A small pericardial effusion is present. Moderate pleural effusion in  the right lateral region.   4. The mitral valve is normal in structure. Mild mitral valve  regurgitation. No evidence of mitral stenosis.   5. Tricuspid valve regurgitation is moderate.   6. The aortic valve is tricuspid. Aortic valve regurgitation is not  visualized. No aortic stenosis is present.   7. The inferior vena cava is dilated in size with >50% respiratory  variability, suggesting right atrial pressure of  8 mmHg.   EKG:  EKG is not ordered today.    Recent Labs: 03/31/2021: Pro B Natriuretic peptide (BNP) 95.0 04/01/2021: TSH 2.30 05/23/2021: ALT 10 06/07/2021: B Natriuretic Peptide 72.9 06/15/2021: BUN 28; Creat 1.49; Hemoglobin 9.9; Magnesium 2.6; Platelets 310; Potassium 4.5; Sodium 140  Recent Lipid Panel    Component Value Date/Time   CHOL 188 04/01/2021 0000   TRIG 90 04/01/2021 0000   HDL 61 04/01/2021 0000   CHOLHDL 3.1 04/01/2021 0000   VLDL 11 02/05/2021 0243   LDLCALC 109 (H) 04/01/2021 0000     Risk Assessment/Calculations:           Physical Exam:    VS:  BP (!) 149/78   Pulse 73   Ht 5' (1.524 m)   Wt 152 lb (68.9 kg)   SpO2 100%   BMI 29.69 kg/m     Wt Readings from Last 3 Encounters:  07/09/21 152 lb (68.9 kg)  07/08/21 152 lb (68.9 kg)  06/07/21 152 lb 12.8 oz (69.3 kg)     GEN:  Well nourished, well developed in no acute distress HEENT: Normal NECK: No JVD; No carotid bruits LYMPHATICS: No lymphadenopathy CARDIAC: RRR, no murmurs, rubs, gallops RESPIRATORY:  Clear to auscultation without rales, wheezing or rhonchi  ABDOMEN: Soft, non-tender, non-distended MUSCULOSKELETAL:  No edema; No deformity  SKIN: Warm and dry NEUROLOGIC:  Alert and oriented x 3 PSYCHIATRIC:  Normal affect   ASSESSMENT:    1. Chronic diastolic CHF (congestive heart failure) ([place])   2. Pulmonary HTN ([place])   3. Primary hypertension   4. Controlled type 2 diabetes mellitus without complication, without long-term current use of insulin ([place])   5. H/O: CVA (cerebrovascular accident)   6. Chronic respiratory failure with hypoxia, on home oxygen therapy (HCC)   7. Coronary artery disease involving native coronary artery of native heart without angina pectoris    PLAN:    In order of problems listed above:  Chronic diastolic heart failure: Euvolemic on exam.  She has no lower extremity edema.  She is on home oxygen 24/7.  Her weight has been stable since discharge.   Continue torsemide 40 mg twice a day  Pulmonary hypertension: Likely combination of WHO class I, II and III.  She has both diastolic heart failure and severe restrictive lung disease based on recent PFT.  She has been started on sildenafil with improvement in her breathing.  Hypertension: Blood pressure stable  DM2: Managed by primary care provider  History of CVA: No recent recurrence  Chronic respiratory failure with hypoxia: On home oxygen, followed by pulmonology service  History of nonobstructive CAD: Denies any recent chest pain.           Medication Adjustments/Labs and Tests Ordered: Current medicines are reviewed at length with the patient today.  Concerns regarding medicines are outlined above.  No orders of the defined types were placed in this encounter.  No orders of the defined types were placed in this encounter.   Patient Instructions  Medication Instructions:  Your physician recommends that you continue on your current medications as directed. Please refer to the Current Medication list given to you today.  *If you need a refill on your cardiac medications before your next appointment, please call your pharmacy*  Lab Work: NONE ordered at this time of appointment   If you have labs (blood work) drawn today and your tests are completely normal, you will receive your results only by: [place] (if you have MyChart) OR A paper copy in the mail If you have any lab test that is abnormal or we need to change your treatment, we will call you to review the results.  Testing/Procedures: NONE ordered at this time of appointment   Follow-Up: At [facility], you and  your health needs are our priority.  As part of our continuing mission to provide you with exceptional heart care, we have created designated Provider Care Teams.  These Care Teams include your primary Cardiologist (physician) and Advanced Practice Providers (APPs -  Physician Assistants and Nurse  Practitioners) who all work together to provide you with the care you need, when you need it.  We recommend signing up for the patient portal called "MyChart".  Sign up information is provided on this After Visit Summary.  MyChart is used to connect with patients for Virtual Visits (Telemedicine).  Patients are able to view lab/test results, encounter notes, upcoming appointments, etc.  Non-urgent messages can be sent to your provider as well.   To learn more about what you can do with MyChart, go to [email or web address].    Your next appointment:   4-5 month(s)  The format for your next appointment:   In Person  Provider:   Donato Heinz, MD     Other Instructions   Important Information About Sugar         Hilbert Corrigan, [place]  07/11/2021 11:43 PM    [place]

## 2021-07-09 NOTE — Patient Instructions (Signed)
Medication Instructions:  ?Your physician recommends that you continue on your current medications as directed. Please refer to the Current Medication list given to you today. ? ?*If you need a refill on your cardiac medications before your next appointment, please call your pharmacy* ? ?Lab Work: ?NONE ordered at this time of appointment  ? ?If you have labs (blood work) drawn today and your tests are completely normal, you will receive your results only by: ?MyChart Message (if you have MyChart) OR ?A paper copy in the mail ?If you have any lab test that is abnormal or we need to change your treatment, we will call you to review the results. ? ?Testing/Procedures: ?NONE ordered at this time of appointment  ? ?Follow-Up: ?At [facility], you and your health needs are our priority.  As part of our continuing mission to provide you with exceptional heart care, we have created designated Provider Care Teams.  These Care Teams include your primary Cardiologist (physician) and Advanced Practice Providers (APPs -  Physician Assistants and Nurse Practitioners) who all work together to provide you with the care you need, when you need it. ? ?We recommend signing up for the patient portal called "MyChart".  Sign up information is provided on this After Visit Summary.  MyChart is used to connect with patients for Virtual Visits (Telemedicine).  Patients are able to view lab/test results, encounter notes, upcoming appointments, etc.  Non-urgent messages can be sent to your provider as well.   ?To learn more about what you can do with MyChart, go to [email or web address].   ? ?Your next appointment:   ?4-5 month(s) ? ?The format for your next appointment:   ?In Person ? ?Provider:   ?Christopher L Schumann, MD   ? ?Other Instructions ? ? ?Important Information About Sugar ? ? ? ? ? ? ?

## 2021-07-11 ENCOUNTER — Encounter: Payer: Self-pay | Admitting: Physician Assistant

## 2021-07-12 NOTE — Progress Notes (Signed)
CXR showed persistent right pleural effusion, slight decrease left sided pleural effusion. Cardiomegaly. Cont diuretics. If sob worsens notify us- keep apt in august with Dr. Silas Flood

## 2021-07-22 ENCOUNTER — Ambulatory Visit (HOSPITAL_COMMUNITY)
Admission: RE | Admit: 2021-07-22 | Discharge: 2021-07-22 | Disposition: A | Discharge status: Home / Self Care | Payer: Medicare HMO | Source: Ambulatory Visit | Attending: Internal Medicine | Admitting: Internal Medicine

## 2021-07-22 ENCOUNTER — Encounter (HOSPITAL_COMMUNITY): Payer: Self-pay | Admitting: Internal Medicine

## 2021-07-22 VITALS — BP 142/70 | HR 76 | Wt 152.1 lb

## 2021-07-22 DIAGNOSIS — I5033 Acute on chronic diastolic (congestive) heart failure: Secondary | ICD-10-CM | POA: Diagnosis not present

## 2021-07-22 DIAGNOSIS — E1122 Type 2 diabetes mellitus with diabetic chronic kidney disease: Secondary | ICD-10-CM | POA: Diagnosis not present

## 2021-07-22 DIAGNOSIS — I272 Pulmonary hypertension, unspecified: Secondary | ICD-10-CM

## 2021-07-22 DIAGNOSIS — N1831 Chronic kidney disease, stage 3a: Secondary | ICD-10-CM

## 2021-07-22 DIAGNOSIS — E785 Hyperlipidemia, unspecified: Secondary | ICD-10-CM | POA: Insufficient documentation

## 2021-07-22 DIAGNOSIS — Z7902 Long term (current) use of antithrombotics/antiplatelets: Secondary | ICD-10-CM | POA: Diagnosis not present

## 2021-07-22 DIAGNOSIS — I2510 Atherosclerotic heart disease of native coronary artery without angina pectoris: Secondary | ICD-10-CM | POA: Diagnosis not present

## 2021-07-22 DIAGNOSIS — I130 Hypertensive heart and chronic kidney disease with heart failure and stage 1 through stage 4 chronic kidney disease, or unspecified chronic kidney disease: Secondary | ICD-10-CM | POA: Insufficient documentation

## 2021-07-22 DIAGNOSIS — I5032 Chronic diastolic (congestive) heart failure: Secondary | ICD-10-CM

## 2021-07-22 DIAGNOSIS — Z8673 Personal history of transient ischemic attack (TIA), and cerebral infarction without residual deficits: Secondary | ICD-10-CM | POA: Diagnosis not present

## 2021-07-22 DIAGNOSIS — I10 Essential (primary) hypertension: Secondary | ICD-10-CM

## 2021-07-22 DIAGNOSIS — C9000 Multiple myeloma not having achieved remission: Secondary | ICD-10-CM | POA: Diagnosis not present

## 2021-07-22 DIAGNOSIS — Z79899 Other long term (current) drug therapy: Secondary | ICD-10-CM | POA: Diagnosis not present

## 2021-07-22 DIAGNOSIS — J9611 Chronic respiratory failure with hypoxia: Secondary | ICD-10-CM | POA: Diagnosis not present

## 2021-07-22 LAB — BASIC METABOLIC PANEL
Anion gap: 6 (ref 5–15)
BUN: 21 mg/dL (ref 8–23)
CO2: 32 mmol/L (ref 22–32)
Calcium: 9 mg/dL (ref 8.9–10.3)
Chloride: 101 mmol/L (ref 98–111)
Creatinine, Ser: 1.27 mg/dL — ABNORMAL HIGH (ref 0.44–1.00)
GFR, Estimated: 43 mL/min — ABNORMAL LOW (ref >60)
Glucose, Bld: 117 mg/dL — ABNORMAL HIGH (ref 70–99)
Potassium: 3.5 mmol/L (ref 3.5–5.1)
Sodium: 139 mmol/L (ref 135–145)

## 2021-07-22 LAB — BRAIN NATRIURETIC PEPTIDE: B Natriuretic Peptide: 84.7 pg/mL (ref 0.0–100.0)

## 2021-07-22 MED ORDER — ENTRESTO 49-51 MG PO TABS: 1.0000 | ORAL_TABLET | Freq: Two times a day (BID) | ORAL | 3 refills | Status: AC

## 2021-07-22 MED ORDER — CARVEDILOL 6.25 MG PO TABS: 6.2500 mg | ORAL_TABLET | Freq: Two times a day (BID) | ORAL | 3 refills | Status: DC

## 2021-07-22 NOTE — Progress Notes (Addendum)
ADVANCED HF CLINIC CONSULT NOTE     Referring Physician: Dr. Cathlean Sauer Primary Care: Dr. Jeanie Cooks Primary Cardiologist: Dr. Gardiner Rhyme  HPI: 81 y.o. 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   She has been followed by Pulmonology (Dr. Silas Flood) for pulmonary hypertension. Multiple admissions over the last 6 months with a/c respiratory failure and volume overload. L/RHC 11/22: mild CAD, PA mean 2, PA 72/16 (38),  PCWP mean 18, LVEDP 9, PVR 3.44 Pulmonary hypertension felt to be mix of Group I with component of Group II and possible Group III (? Tracheobronchomalacia vs OSA/OHS). Negative V/Q scan.   She was admitted from January 31 through February 22, 2021 with respiratory failure, acute on chronic diastolic heart failure and RV failure. Had  She required milrinone during the admission and was started on sildenafil.   She's had recurrent pleural effusions/right hydropneumothorax and required thoracentesis on multiple occasions.   Hospitalized 05/23 x 2 with volume overload and resp failure. High suspicion Opsumit contributing to increased preload in setting of severe diastolic dysfunction. Opsumit and Tadalafil stopped, started on sildenafil 20 TID. Also treated for PNA. Social worker had arranged Home Health and CPAP at discharge, daughter reports patient has not received either.  She was discharged on 40 mg Torsemide BID.  Referred to HF clinic for pulmonary hypertension by Dr. Cathlean Sauer. Here with her daughter, Overall feeling fine. Remains SOB with mild to moderate exertion. On chronic on 2-3 liters oxygen.  Denies PND/Orthopnea. Edema controlled. No presyncope,. Appetite ok. No fever or chills. Weight at home 150-151  pounds. Taking all medications.  She lives with her daughter and ambulates with a walker. Daughter assists  with ADLs.    Review of Systems: [y] = yes, '[ ]'$  = no   General: Weight gain '[ ]'$ ; Weight loss '[ ]'$ ; Anorexia '[ ]'$ ; Fatigue [Y]; Fever '[ ]'$ ; Chills '[ ]'$ ; Weakness '[ ]'$   Cardiac: Chest pain/pressure '[ ]'$ ; Resting SOB '[ ]'$ ; Exertional SOB [Y]; Orthopnea [Y]; Pedal Edema '[ ]'$ ; Palpitations '[ ]'$ ; Syncope '[ ]'$ ; Presyncope '[ ]'$ ; Paroxysmal nocturnal dyspnea'[ ]'$   Pulmonary: Cough '[ ]'$ ; Wheezing'[ ]'$ ; Hemoptysis'[ ]'$ ; Sputum '[ ]'$ ; Snoring '[ ]'$   GI: Vomiting'[ ]'$ ; Dysphagia'[ ]'$ ; Melena'[ ]'$ ; Hematochezia '[ ]'$ ; Heartburn'[ ]'$ ; Abdominal pain '[ ]'$ ; Constipation '[ ]'$ ; Diarrhea '[ ]'$ ; BRBPR '[ ]'$   GU: Hematuria'[ ]'$ ; Dysuria '[ ]'$ ; Nocturia'[ ]'$   Vascular: Pain in legs with walking '[ ]'$ ; Pain in feet with lying flat '[ ]'$ ; Non-healing sores '[ ]'$ ; Stroke [Y]; TIA '[ ]'$ ; Slurred speech '[ ]'$ ;  Neuro: Headaches'[ ]'$ ; Vertigo'[ ]'$ ; Seizures'[ ]'$ ; Paresthesias'[ ]'$ ;Blurred vision '[ ]'$ ; Diplopia '[ ]'$ ; Vision changes '[ ]'$   Ortho/Skin: Arthritis [ y]; Joint pain [ y]; Muscle pain '[ ]'$ ; Joint swelling '[ ]'$ ; Back Pain '[ ]'$ ; Rash '[ ]'$   Psych: Depression'[ ]'$ ; Anxiety'[ ]'$   Heme: Bleeding problems '[ ]'$ ; Clotting disorders '[ ]'$ ; Anemia '[ ]'$   Endocrine: Diabetes [Y]; Thyroid dysfunction[Y]   Past Medical History:  Diagnosis Date   Aortic atherosclerosis ([place]) 10/08/2018   Arthritis    Chronic kidney disease, stage 3a ([place])    Chronic respiratory failure ([facility])    Diabetes mellitus without complication (HCC)    GERD (gastroesophageal reflux disease)    Hepatic steatosis 10/08/2018   Hyperlipidemia    Hypertension    Hyperthyroidism    Mass  of right ovary 10/08/2018   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/w benign thyroid tissue   Mild CAD    Osteoporosis    Pneumonia    Renal infarct (HCC)    SBO (small bowel obstruction) ([place]) 08/09/2018   Severe pulmonary hypertension (HCC)    Stroke (cerebrum) (HCC)     Current Outpatient Medications  Medication Sig Dispense Refill   acetaminophen (TYLENOL) 500 MG tablet Take 1,000 mg by mouth every 6 (six) hours as needed for fever  or headache (pain).     albuterol (PROVENTIL) (2.5 MG/3ML) 0.083% nebulizer solution Take 2.5 mg by nebulization every 6 (six) hours as needed for wheezing or shortness of breath.     albuterol (VENTOLIN HFA) 108 (90 Base) MCG/ACT inhaler Inhale 2 puffs into the lungs every 6 (six) hours as needed for wheezing or shortness of breath. 18 g 0   alendronate (FOSAMAX) 70 MG tablet Take 70 mg by mouth once a week.     atorvastatin (LIPITOR) 80 MG tablet Take 1 tablet (80 mg total) by mouth every evening. 30 tablet 1   calcium carbonate (OSCAL) 1500 (600 Ca) MG TABS tablet Take 600 mg of elemental calcium by mouth 2 (two) times daily with a meal.     carvedilol (COREG) 12.5 MG tablet Take 1 tablet (12.5 mg total) by mouth 2 (two) times daily with a meal. 180 tablet 1   cetirizine (ZYRTEC) 10 MG tablet Take 10 mg by mouth daily.     clopidogrel (PLAVIX) 75 MG tablet Take 1 tablet (75 mg total) by mouth daily. 30 tablet 2   diclofenac sodium (VOLTAREN) 1 % GEL Apply 4 g topically 4 (four) times daily as needed for pain. shoulder     fluticasone (FLONASE) 50 MCG/ACT nasal spray Place 1 spray into both nostrils daily. 1 mL 2   gabapentin (NEURONTIN) 100 MG capsule Take 100 mg by mouth at bedtime.     guaiFENesin (MUCINEX) 600 MG 12 hr tablet Take 600 mg by mouth 2 (two) times daily as needed.     hydrALAZINE (APRESOLINE) 25 MG tablet Take 25 mg by mouth 3 (three) times daily.     melatonin 5 MG TABS Take 10 mg by mouth at bedtime.     methimazole (TAPAZOLE) 5 MG tablet Take 0.5 tablets (2.5 mg total) by mouth daily. 30 tablet 0   Multiple Vitamin (MULTIVITAMIN WITH MINERALS) TABS tablet Take 1 tablet by mouth every morning.     Omega-3 Fatty Acids (FISH OIL PO) Take 1 capsule by mouth every morning.     OXYGEN Inhale 4 L into the lungs continuous.     pantoprazole (PROTONIX) 40 MG tablet Take 1 tablet (40 mg total) by mouth daily at 6 (six) AM. 30 tablet 0   potassium chloride SA (KLOR-CON M) 20 MEQ tablet  Take 2 tablets (40 mEq total) by mouth daily. 60 tablet 1   sildenafil (REVATIO) 20 MG tablet Take 1 tablet (20 mg total) by mouth 3 (three) times daily. 90 tablet 5   Torsemide 40 MG TABS Take 40 mg by mouth 2 (two) times daily. 60 tablet 1   vitamin B-12 (CYANOCOBALAMIN) 1000 MCG tablet Take 1,000 mcg by mouth daily.     vitamin C (ASCORBIC ACID) 500 MG tablet Take 500 mg by mouth daily.     Vitamin D, Cholecalciferol, 25 MCG (1000 UT) TABS Take 1,000 Units by mouth daily.     zinc gluconate 50 MG tablet Take  50 mg by mouth daily.     No current facility-administered medications for this encounter.    No Known Allergies    Social History   Socioeconomic History   Marital status: Widowed    Spouse name: Not on file   Number of children: Not on file   Years of education: Not on file   Highest education level: Not on file  Occupational History   Not on file  Tobacco Use   Smoking status: Never   Smokeless tobacco: Never  Vaping Use   Vaping Use: Never used  Substance and Sexual Activity   Alcohol use: No   Drug use: No   Sexual activity: Not on file  Other Topics Concern   Not on file  Social History Narrative   Patient is from [place]   Speaks K[place]   Social Determinants of Health   Financial Resource Strain: Medium Risk (06/07/2021)   Overall Financial Resource Strain (CARDIA)    Difficulty of Paying Living Expenses: Somewhat hard  Food Insecurity: No Food Insecurity (06/07/2021)   Hunger Vital Sign    Worried About Running Out of Food in the Last Year: Never true    Ran Out of Food in the Last Year: Never true  Transportation Needs: No Transportation Needs (06/07/2021)   PRAPARE - Hydrologist (Medical): No    Lack of Transportation (Non-Medical): No  Physical Activity: Not on file  Stress: Not on file  Social Connections: Not on file  Intimate Partner Violence: Not on file      Family History  Problem Relation Age of Onset    Kidney disease Son    Colon cancer Neg Hx    Breast cancer Neg Hx     Vitals:   07/22/21 1515  BP: (!) 142/70  Pulse: 76  SpO2: 99%  Weight: 69 kg (152 lb 1.6 oz)   Wt Readings from Last 3 Encounters:  07/22/21 69 kg (152 lb 1.6 oz)  07/09/21 68.9 kg (152 lb)  07/08/21 68.9 kg (152 lb)     PHYSICAL EXAM: General: Elderly No resp difficulty Wearing O2 HEENT: normal Neck: supple. JVP 7. Carotids 2+ bilat; no bruits. No lymphadenopathy or thryomegaly appreciated. Cor: PMI nondisplaced. Regular rate & rhythm.2/6 TR Lungs: clear Abdomen: obese soft, nontender, nondistended. No hepatosplenomegaly. No bruits or masses. Good bowel sounds. Extremities: no cyanosis, clubbing, rash, edema Neuro: alert & orientedx3, cranial nerves grossly intact. moves all 4 extremities w/o difficulty. Affect pleasant  ASSESSMENT & PLAN:  Chronic diastolic CHF with RV failure: -Echo 11/22: EF 57%, RV okay, RVSP 60 mmHg, moderate TR -L/RHC 11/22: mild CAD, RA mean 2, PA 72/16 (38), PCWP mean 18 mmhg, LVEDP 9 mmHg, Fick CO/CI 6.1/3.55, PVR 3.44 WU, transpulmonary gradient 21 mmHg -Echo 02/05/21: EF 60-65%, RV moderately reduced, estimated PA systolic 678 mmHg, severe TR -Limited echo 02/14/21: EF 55-60%, RV not well visualized, RVSP 61 mmHg, moderate TR -Echo 05/23: EF 55-60%, severe LVH, grade II DD, RV moderately reduced, PASP 68 mmHg, moderate TR -PYP and myeloma ordered but not done. Will re-order.  -NYHA II-III. Volume status looks good..  Continue Torsemide 40 mg BID.  -Would decrease carvedilol to 6.25  bid -Continue Farxiga 10 mg daily -Add entresto 49-51 mg twice a day.  -Labs today  2. Pulmonary hypertension: -Suspected combination of Group 1, 2, 3. In looking at echo I feel that this is mostly WHO Group 2 +/- 3 and not WHO Group 1  -  [place] 11/22 as above -Following with Dr. Silas Flood -Continue sildenafil 20 mg TID -Macitentan stopped 05/23 d/t pulmonary edema. Would not restart -Sleep  study next month.  - Continue O2 support -PFTs ordered -VQ negative  3. Chronic respiratory failure with hypoxia: -Uses 2L O2 at rest, 3L with exertion -Sats stable today.   4. CKD IIIa: -Baseline Scr 1.2-1.3 -Labs today  5. DM II: -Continue Farxiga  6. HTN: -BP a bit. In setting of diastolic dysfunction/HF will stop hydralalzine. Add entresto 49-51 mg bid  7. Hx cryptogenic stroke 11/22: -On plavix + statin -Neurology felt likely embolic -No AF documented thus far. Seen by EP 01/23, declined loop recorder placement   Glori Bickers, MD  4:04 PM

## 2021-07-22 NOTE — Patient Instructions (Addendum)
Stop Hydralazine.  Decrease Carvedilol to 6.'25mg'$  Twice daily  Start Entresto 49/51 Twice daily   Labs done today, your results will be available in MyChart, we will contact you for abnormal readings.  Repeat blood work in 7 days  Your physician recommends that you schedule a follow-up appointment in: 3 months (October 2023) ** please call the office in [facility] [place] to arrange your follow up appointment **  If you have any questions or concerns before your next appointment please send [place] a message through [place] or call our office at [phone].    TO LEAVE A MESSAGE FOR THE NURSE SELECT OPTION 2, PLEASE LEAVE A MESSAGE INCLUDING: YOUR NAME DATE OF BIRTH CALL BACK NUMBER REASON FOR CALL**this is important as we prioritize the call backs  YOU WILL RECEIVE A CALL BACK THE SAME DAY AS LONG AS YOU CALL BEFORE 4:00 PM  At the [facility], you and your health needs are our priority. As part of our continuing mission to provide you with exceptional heart care, we have created designated Provider Care Teams. These Care Teams include your primary Cardiologist (physician) and Advanced Practice Providers (APPs- Physician Assistants and Nurse Practitioners) who all work together to provide you with the care you need, when you need it.   You may see any of the following providers on your designated Care Team at your next follow up: Dr Glori Bickers Dr Haynes Kerns, NP Lyda Jester, [place] [facility] [place], [place] Audry Riles, PharmD   Please be sure to bring in all your medications bottles to every appointment.

## 2021-07-28 DIAGNOSIS — J9601 Acute respiratory failure with hypoxia: Secondary | ICD-10-CM | POA: Diagnosis not present

## 2021-07-28 DIAGNOSIS — I5032 Chronic diastolic (congestive) heart failure: Secondary | ICD-10-CM | POA: Diagnosis not present

## 2021-07-28 DIAGNOSIS — E1140 Type 2 diabetes mellitus with diabetic neuropathy, unspecified: Secondary | ICD-10-CM | POA: Diagnosis not present

## 2021-07-28 DIAGNOSIS — I10 Essential (primary) hypertension: Secondary | ICD-10-CM | POA: Diagnosis not present

## 2021-08-04 ENCOUNTER — Ambulatory Visit (HOSPITAL_COMMUNITY)
Admission: RE | Admit: 2021-08-04 | Discharge: 2021-08-04 | Disposition: A | Discharge status: Home / Self Care | Payer: Medicare HMO | Source: Ambulatory Visit | Attending: Cardiology | Admitting: Cardiology

## 2021-08-04 DIAGNOSIS — I272 Pulmonary hypertension, unspecified: Secondary | ICD-10-CM | POA: Insufficient documentation

## 2021-08-04 LAB — BASIC METABOLIC PANEL
Anion gap: 6 (ref 5–15)
BUN: 31 mg/dL — ABNORMAL HIGH (ref 8–23)
CO2: 34 mmol/L — ABNORMAL HIGH (ref 22–32)
Calcium: 9.3 mg/dL (ref 8.9–10.3)
Chloride: 100 mmol/L (ref 98–111)
Creatinine, Ser: 1.35 mg/dL — ABNORMAL HIGH (ref 0.44–1.00)
GFR, Estimated: 40 mL/min — ABNORMAL LOW (ref >60)
Glucose, Bld: 141 mg/dL — ABNORMAL HIGH (ref 70–99)
Potassium: 3.7 mmol/L (ref 3.5–5.1)
Sodium: 140 mmol/L (ref 135–145)

## 2021-08-17 ENCOUNTER — Ambulatory Visit (HOSPITAL_BASED_OUTPATIENT_CLINIC_OR_DEPARTMENT_OTHER): Payer: Medicare HMO | Attending: Primary Care | Admitting: Pulmonary Disease

## 2021-08-17 DIAGNOSIS — J9611 Chronic respiratory failure with hypoxia: Secondary | ICD-10-CM | POA: Diagnosis present

## 2021-08-17 DIAGNOSIS — G4733 Obstructive sleep apnea (adult) (pediatric): Secondary | ICD-10-CM | POA: Diagnosis not present

## 2021-08-17 DIAGNOSIS — Z9981 Dependence on supplemental oxygen: Secondary | ICD-10-CM | POA: Insufficient documentation

## 2021-08-17 DIAGNOSIS — G4736 Sleep related hypoventilation in conditions classified elsewhere: Secondary | ICD-10-CM | POA: Insufficient documentation

## 2021-08-17 DIAGNOSIS — E119 Type 2 diabetes mellitus without complications: Secondary | ICD-10-CM | POA: Insufficient documentation

## 2021-08-17 DIAGNOSIS — I10 Essential (primary) hypertension: Secondary | ICD-10-CM | POA: Diagnosis not present

## 2021-08-17 DIAGNOSIS — R0683 Snoring: Secondary | ICD-10-CM

## 2021-08-17 DIAGNOSIS — I272 Pulmonary hypertension, unspecified: Secondary | ICD-10-CM | POA: Diagnosis not present

## 2021-08-26 ENCOUNTER — Ambulatory Visit: Payer: Medicare HMO | Admitting: Pulmonary Disease

## 2021-08-26 NOTE — Procedures (Signed)
Patient Name: Mandy [name], Mandy [name] Date: 08/17/2021 Gender: Female D.O.B: Oct 07, 1940 Age (years): 89 Referring Provider: Geraldo Pitter NP Height (inches): 58 Interpreting Physician: Kara Mead MD, ABSM Weight (lbs): 152 RPSGT: Zadie Rhine BMI: 32 MRN: [id number] Neck Size: 13.50 <br> <br> CLINICAL INFORMATION Sleep Study Type: NPSG    Indication for sleep study: Diabetes, Hypertension, Snoring ,pulmonary hypertension, tracheobronchomalacia    Epworth Sleepiness Score: 5    SLEEP STUDY TECHNIQUE As per the AASM Manual for the Scoring of Sleep and Associated Events v2.3 (April 2016) with a hypopnea requiring 4% desaturations.  The channels recorded and monitored were frontal, central and occipital EEG, electrooculogram (EOG), submentalis EMG (chin), nasal and oral airflow, thoracic and abdominal wall motion, anterior tibialis EMG, snore microphone, electrocardiogram, and pulse oximetry.  MEDICATIONS Medications self-administered by patient taken the night of the study : NEURONTIN, [place] The study was initiated at 10:17:24 PM and ended at 4:22:37 AM.  Sleep onset time was 297.5 minutes and the sleep efficiency was 2.3%%. The total sleep time was 8.5 minutes.  Stage REM latency was 21.0 minutes.  The patient spent 47.1%% of the night in stage N1 sleep, 23.5%% in stage N2 sleep, 0.0%% in stage N3 and 29.4% in REM.  Alpha intrusion was absent.  Supine sleep was 100.00%.  RESPIRATORY PARAMETERS The overall apnea/hypopnea index (AHI) was 35.3 per hour. There were 4 total apneas, including 4 obstructive, 0 central and 0 mixed apneas. There were 1 hypopneas and 1 RERAs.  The AHI during Stage REM sleep was 24.0 per hour.  AHI while supine was 35.3 per hour.  The mean oxygen saturation was 97.9%. The minimum SpO2 during sleep was 85.0%.  soft snoring was noted during this study.  CARDIAC DATA The 2 lead EKG demonstrated sinus rhythm. The mean  heart rate was 65.1 beats per minute. Other EKG findings include: None.   LEG MOVEMENT DATA The total PLMS were 0 with a resulting PLMS index of 0.0. Associated arousal with leg movement index was 0.0 .  IMPRESSIONS - Only 8.5 minutes of sleep were recorded - Severe obstructive sleep apnea occurred during this study (AHI = 35.3/h). - Mild oxygen desaturation was noted during this study (Min O2 = 85.0%). The study was performed on 2 L O2. - The patient snored with soft snoring volume. - No cardiac abnormalities were noted during this study. - Clinically significant periodic limb movements did not occur during sleep. No significant associated arousals.   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 Alternatively home sleep study can be tried off O2 - Avoid alcohol, sedatives and other CNS depressants that may worsen sleep apnea and disrupt normal sleep architecture. - Sleep hygiene should be reviewed to assess factors that may improve sleep quality. - Weight management and regular exercise should be initiated or continued if appropriate.   Kara Mead MD Board Certified in [place]

## 2021-08-27 ENCOUNTER — Ambulatory Visit (INDEPENDENT_AMBULATORY_CARE_PROVIDER_SITE_OTHER): Payer: Medicare HMO | Admitting: Nurse Practitioner

## 2021-08-27 ENCOUNTER — Encounter: Payer: Self-pay | Admitting: Nurse Practitioner

## 2021-08-27 VITALS — BP 120/60 | HR 68 | Temp 98.2°F | Ht <= 58 in | Wt 153.2 lb

## 2021-08-27 DIAGNOSIS — J9611 Chronic respiratory failure with hypoxia: Secondary | ICD-10-CM

## 2021-08-27 DIAGNOSIS — J398 Other specified diseases of upper respiratory tract: Secondary | ICD-10-CM

## 2021-08-27 DIAGNOSIS — Z9981 Dependence on supplemental oxygen: Secondary | ICD-10-CM | POA: Diagnosis not present

## 2021-08-27 DIAGNOSIS — I5032 Chronic diastolic (congestive) heart failure: Secondary | ICD-10-CM

## 2021-08-27 DIAGNOSIS — R0683 Snoring: Secondary | ICD-10-CM

## 2021-08-27 DIAGNOSIS — I272 Pulmonary hypertension, unspecified: Secondary | ICD-10-CM

## 2021-08-27 NOTE — Assessment & Plan Note (Signed)
Stable without increased O2 demand. Continue supplemental oxygen 2 lpm at rest and 3 lpm with activity for goal >88-90%

## 2021-08-27 NOTE — Progress Notes (Addendum)
$'@Patient's$  ID: Mandy [name], female    DOB: August 12, 1940, 81 y.o.   MRN: [id number]  Chief Complaint  Patient presents with   Follow-up    Breathing a bit improved.    Referring provider: [name], MD  HPI: 81 year old female, never smoker followed for severe pulmonary hypertension, chronic respiratory failure and history of pleural effusions.  She is a patient of Dr. Kavin [name] and last seen in office 07/08/2021 with Mandy Napoleon NP.  Past medical history significant for hypertension, CHF, history of CVA, GERD, DM2, hypothyroid, OA, CKD stage III, HLD, obesity, anxiety.  TEST/EVENTS:  11/30/2020 cardiac cath: EF 55 to 65%.  Mid LAD lesion is 30% stenosed, distal LAD lesion is 30% stenosed; consistent with mild obstructive CAD.  Preserved cardiac output of 6.1, index of 3.55.  Mean PASP 39 mmHg with resistance of 3.44; mean wedge pressure 18 mmHg.  LVEDP of 9 mmHg with preserved LVEF 02/05/2021 echocardiogram: EF 60 to 65%.  Severe LVH.  G1 DD.  RV function moderately reduced and size moderately enlarged.  Severe pulmonary hypertension estimated PASP of 100 mmHg. Pericardial effusion present.  Trivial MR. Tricuspid valve regurgitation severe. 02/14/2021 echocardiogram limited: EF 55 to 60%.  Moderate LVH.  Severely elevated PASP with pressure 61.4.  Circumferential pericardial effusion.  Moderate pleural effusion on the left.  Trivial MR.  Moderate TR.  05/26/2021: Mandy [name] with Mandy Napoleon NP for hospital follow up. Previously seen by Mandy Gulley NP at [place] office with worsening SOB and hypoxia - sent to ED where she was admitted from 05/05/2021-05/12/2021 and again from 05/22/2021-05/25/2021 for heart failure and pulmonary hypertension. Initially required noninvasive mechanical ventilation for respiratory distress. Eventually weaned to high flow . She was diuresed with IV furosemide. Discharged home on room air.  She was continued on torsemide at discharge and plans to continue heart failure management with carvedilol,  hydralazine and empagliflozin.  Dr. Silas [name] recommended stopping tadalafil and Opsumit and restarting patient on sildenafil for pulmonary hypertension group 1 and 2.  She was seen at this OV for hospital follow-up.  Breathing was improved.  Still gets short winded with activity and is using 2 L at rest and increasing to 3 L with exertion.  Weight was stable and leg swelling was at baseline.  Compliant with torsemide 40 mg twice daily, needs new prescription.  Also needs Rx for sildenafil sent to specialty pharmacy.  07/08/2021: Mandy [name] with Mandy Napoleon NP for 6-week follow-up.  Doing well with no acute complaints.  Does have an occasional cough and DOE.  Maintained on torsemide 40 mg twice daily, carvedilol 12.5 mg twice daily, hydralazine 50 mg 3 times daily, sildenafil 20 mg 3 times daily.  Using supplemental O2 2 L at rest and 3 L with exertion.  Sleeps with oxygen at night.  Never had a formal sleep study; not currently on CPAP.  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  PFTs from June 2023 showed moderate restrictive lung disease and moderate severe diffusion defect.  Recommended in lab split-night sleep study to assess for underlying OSA due to tracheobronchomalacia and pulmonary hypertension.  08/27/2021: Today-follow up Patient presents today with her daughter for follow-up after undergoing in lab sleep study.  Unfortunately, sleep study was inadequate as she only had 8 minutes of sleep time.  There was evidence of severe OSA during this but they were unable to complete the study.  Sleep doctor recommended repeat in lab study or home sleep study off oxygen.  Today, she reports feeling relatively the same as she did when she was seen last.  Breathing overall is stable.  She has not had any increased leg swelling.  Weights have also been stable at home.  No cough or chest congestion.  Does have some fatigue during the day. She was seen by Dr.  Haroldine [name] at the advanced [facility] failure clinic on 7/20.  After reviewing echo, felt as though her pulmonary hypertension was mostly WHO Group 2 +/-3.  Continued sildenafil 20 mg 3 times daily.  He continued her on torsemide 40 mg twice daily, decreased her carvedilol to 6.25 twice daily, continued Farxiga 10 mg daily and added Entresto 49 to 51 mg twice daily for CHF with RV failure.  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     albuterol (PROVENTIL) (2.5 MG/3ML) 0.083% nebulizer solution Take 2.5 mg by nebulization every 6 (six) hours as needed for wheezing or shortness of breath.     albuterol (VENTOLIN HFA) 108 (90 Base) MCG/ACT inhaler Inhale 2 puffs into the lungs every 6 (six) hours as needed for wheezing or shortness of breath. 18 g 0   alendronate (FOSAMAX) 70 MG tablet Take 70 mg by mouth once a week.     atorvastatin (LIPITOR) 80 MG tablet Take 1 tablet (80  mg total) by mouth every evening. 30 tablet 1   calcium carbonate (OSCAL) 1500 (600 Ca) MG TABS tablet Take 600 mg of elemental calcium by mouth 2 (two) times daily with a meal.     carvedilol (COREG) 6.25 MG tablet Take 1 tablet (6.25 mg total) by mouth 2 (two) times daily with a meal. 180 tablet 3   cetirizine (ZYRTEC) 10 MG tablet Take 10 mg by mouth daily.     clopidogrel (PLAVIX) 75 MG tablet Take 1 tablet (75 mg total) by mouth daily. 30 tablet 2   diclofenac sodium (VOLTAREN) 1 % GEL Apply 4 g topically 4 (four) times daily as needed for pain. shoulder     fluticasone (FLONASE) 50 MCG/ACT nasal spray Place 1 spray into both nostrils daily. 1 mL 2   gabapentin (NEURONTIN) 100 MG capsule Take 100 mg by mouth at bedtime.     guaiFENesin (MUCINEX) 600 MG 12 hr tablet Take 600 mg by mouth 2 (two) times daily as needed.     melatonin 5 MG TABS Take  10 mg by mouth at bedtime.     methimazole (TAPAZOLE) 5 MG tablet Take 0.5 tablets (2.5 mg total) by mouth daily. 30 tablet 0   Multiple Vitamin (MULTIVITAMIN WITH MINERALS) TABS tablet Take 1 tablet by mouth every morning.     Omega-3 Fatty Acids (FISH OIL PO) Take 1 capsule by mouth every morning.     OXYGEN Inhale 4 L into the lungs continuous.     pantoprazole (PROTONIX) 40 MG tablet Take 1 tablet (40 mg total) by mouth daily at 6 (six) AM. 30 tablet 0   potassium chloride SA (KLOR-CON M) 20 MEQ tablet Take 2 tablets (40 mEq total) by mouth daily. 60 tablet 1   sacubitril-valsartan (ENTRESTO) 49-51 MG Take 1 tablet by mouth 2 (two) times daily. 180 tablet 3   sildenafil (REVATIO) 20 MG tablet Take 1 tablet (20 mg total) by mouth 3 (three) times daily. 90 tablet 5   Torsemide 40 MG TABS Take 40 mg by mouth 2 (two) times daily. 60 tablet 1   vitamin B-12 (CYANOCOBALAMIN) 1000 MCG tablet Take 1,000 mcg by mouth daily.     vitamin C (ASCORBIC ACID) 500 MG tablet Take 500 mg by mouth daily.     Vitamin D, Cholecalciferol, 25 MCG (1000 UT) TABS Take  1,000 Units by mouth daily.     zinc gluconate 50 MG tablet Take 50 mg by mouth daily.     No facility-administered medications prior to visit.     Review of Systems:   Constitutional: No weight loss or gain, night sweats, fevers, chills. +fatigue HEENT: No headaches, difficulty swallowing, tooth/dental problems, or sore throat. No sneezing, itching, ear ache, nasal congestion, or post nasal drip CV:  No chest pain, orthopnea, PND, swelling in lower extremities, anasarca, dizziness, palpitations, syncope Resp: +shortness of breath with exertion (baseline). No excess mucus or change in color of mucus. No productive or non-productive. No hemoptysis. No wheezing.  No chest wall deformity GI:  No heartburn, indigestion, abdominal pain, nausea, vomiting, diarrhea, change in bowel habits, loss of appetite, bloody stools.  GU: No dysuria, change in color of urine, urgency or frequency.  No flank pain, no hematuria  Skin: No rash, lesions, ulcerations MSK:  No joint pain or swelling.  No decreased range of motion.  No back pain. Neuro: No dizziness or lightheadedness.  Psych: No depression or anxiety. Mood stable.     Physical Exam:  BP 120/60 (BP Location: Right Arm, Patient Position: Sitting)   Pulse 68   Temp 98.2 F (36.8 C) (Oral)   Ht '4\' 10"'$  (1.473 m)   Wt 153 lb 3.2 oz (69.5 kg)   SpO2 100% Comment: 2 L  BMI 32.02 kg/m   GEN: Pleasant, interactive, chronically-ill appearing; obese; in no acute distress. HEENT:  Normocephalic and atraumatic. PERRLA. Sclera mildly jaundiced. Nasal turbinates pink, moist and patent bilaterally. No rhinorrhea present. Oropharynx pink and moist, without exudate or edema. No lesions, ulcerations, or postnasal drip.  NECK:  Supple w/ fair ROM. No JVD present. Normal carotid impulses w/o bruits. Thyroid symmetrical with no goiter or nodules palpated. No lymphadenopathy.   CV: RRR, no m/r/g, no peripheral edema. Pulses intact, +2 bilaterally. No cyanosis,  pallor or clubbing. PULMONARY:  Unlabored, regular breathing. Clear bilaterally A&P w/o wheezes/rales/rhonchi. No accessory muscle use. No dullness to percussion. GI: BS present and normoactive. Soft, non-tender to palpation. No organomegaly or masses detected. No CVA tenderness. MSK: No erythema, warmth or tenderness. Cap refil <2 sec all  extrem. No deformities or joint swelling noted.  Neuro: A/Ox3. No focal deficits noted.   Skin: Warm, no lesions or rashe Psych: Normal affect and behavior. Judgement and thought content appropriate.     Lab Results:  CBC    Component Value Date/Time   WBC 4.2 06/15/2021 1540   RBC 4.04 06/15/2021 1540   HGB 9.9 (L) 06/15/2021 1540   HCT 32.0 (L) 06/15/2021 1540   HCT 33.2 (L) 02/03/2019 1220   PLT 310 06/15/2021 1540   MCV 79.2 (L) 06/15/2021 1540   MCH 24.5 (L) 06/15/2021 1540   MCHC 30.9 (L) 06/15/2021 1540   RDW 15.2 (H) 06/15/2021 1540   LYMPHSABS 1.2 02/18/2021 0253   MONOABS 0.8 02/18/2021 0253   EOSABS 0.1 02/18/2021 0253   BASOSABS 0.0 02/18/2021 0253    BMET    Component Value Date/Time   NA 140 08/04/2021 0911   NA 142 04/16/2021 1536   K 3.7 08/04/2021 0911   CL 100 08/04/2021 0911   CO2 34 (H) 08/04/2021 0911   GLUCOSE 141 (H) 08/04/2021 0911   BUN 31 (H) 08/04/2021 0911   BUN 41 (H) 04/16/2021 1536   CREATININE 1.35 (H) 08/04/2021 0911   CREATININE 1.49 (H) 06/15/2021 1540   CALCIUM 9.3 08/04/2021 0911   GFRNONAA 40 (L) 08/04/2021 0911   GFRNONAA 43 (L) 07/07/2020 0000   GFRAA 50 (L) 07/07/2020 0000    BNP    Component Value Date/Time   BNP 84.7 07/22/2021 1607     Imaging:  SLEEP STUDY DOCUMENTS  Result Date: 08/18/2021 Ordered by an unspecified provider.  Split night study  Result Date: 08/17/2021 Rigoberto Noel, MD     08/26/2021  3:15 PM Patient Name: Mandy [name], Mandy [name] Study Date: 08/17/2021 Gender: Female D.O.B: 1940/07/06 Age (years): 47 Referring Provider: Geraldo Pitter NP Height (inches): 58  Interpreting Physician: Kara Mead MD, ABSM Weight (lbs): 152 RPSGT: Zadie Rhine BMI: 32 MRN: [id number] Neck Size: 13.50 <br> <br> CLINICAL INFORMATION Sleep Study Type: NPSG Indication for sleep study: Diabetes, Hypertension, Snoring ,pulmonary hypertension, tracheobronchomalacia Epworth Sleepiness Score: 5 SLEEP STUDY TECHNIQUE As per the AASM Manual for the Scoring of Sleep and Associated Events v2.3 (April 2016) with a hypopnea requiring 4% desaturations. The channels recorded and monitored were frontal, central and occipital EEG, electrooculogram (EOG), submentalis EMG (chin), nasal and oral airflow, thoracic and abdominal wall motion, anterior tibialis EMG, snore microphone, electrocardiogram, and pulse oximetry. MEDICATIONS Medications self-administered by patient taken the night of the study : NEURONTIN, [place] The study was initiated at 10:17:24 PM and ended at 4:22:37 AM. Sleep onset time was 297.5 minutes and the sleep efficiency was 2.3%%. The total sleep time was 8.5 minutes. Stage REM latency was 21.0 minutes. The patient spent 47.1%% of the night in stage N1 sleep, 23.5%% in stage N2 sleep, 0.0%% in stage N3 and 29.4% in REM. Alpha intrusion was absent. Supine sleep was 100.00%. RESPIRATORY PARAMETERS The overall apnea/hypopnea index (AHI) was 35.3 per hour. There were 4 total apneas, including 4 obstructive, 0 central and 0 mixed apneas. There were 1 hypopneas and 1 RERAs. The AHI during Stage REM sleep was 24.0 per hour. AHI while supine was 35.3 per hour. The mean oxygen saturation was 97.9%. The minimum SpO2 during sleep was 85.0%. soft snoring was noted during this study. CARDIAC DATA The 2 lead EKG demonstrated sinus rhythm. The mean heart rate was 65.1 beats per minute. Other EKG findings include: None. LEG MOVEMENT DATA The total PLMS  were 0 with a resulting PLMS index of 0.0. Associated arousal with leg movement index was 0.0 . IMPRESSIONS - Only 8.5 minutes of sleep  were recorded - Severe obstructive sleep apnea occurred during this study (AHI = 35.3/h). - Mild oxygen desaturation was noted during this study (Min O2 = 85.0%). The study was performed on 2 L O2. - The patient snored with soft snoring volume. - No cardiac abnormalities were noted during this study. - Clinically significant periodic limb movements did not occur during sleep. No significant associated arousals. 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 Alternatively home sleep study can be tried off O2 - Avoid alcohol, sedatives and other CNS depressants that may worsen sleep apnea and disrupt normal sleep architecture. - Sleep hygiene should be reviewed to assess factors that may improve sleep quality. - Weight management and regular exercise should be initiated or continued if appropriate. 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 There was concern for underlying OSA and in lab study was ordered.  She unfortunately was unable to complete this and only spent 8 minutes of the study sleeping.  She was noted to have apneic events during this time period.  Sleep doctor reviewed it and recommended repeat in lab study or home sleep study off supplemental O2.  She would prefer not to go back into the lab again.  I believe she will be able to  tolerate being off oxygen for 1 night.  Her oxygen levels were 100% on 2 L today.  She has not had any difficulties with her breathing recently.  We will order urgent HST off oxygen for further evaluation.   We discussed how untreated sleep apnea puts an individual at risk for cardiac arrhthymias, pulm HTN, DM, stroke and increases their risk for daytime accidents.  Patient Instructions  Continue Albuterol inhaler 2 puffs or 3 mL neb every 6 hours as needed for shortness of breath or wheezing. Notify if symptoms persist despite rescue inhaler/neb use.  Continue zyrtec 1 tab daily for allergies Continue flonase nasal spray 2 sprays each nostril daily Continue sildenafil 20 mg Three times a day  Continue toresemide 40 mg Twice daily  Continue supplemental oxygen 2 lpm and 3 lpm with exertion for goal oxygen saturation greater than 88-90%  We will need to repeat your sleep study due to inadequate sleep. Someone will contact you to get this scheduled.  Follow up in 6 weeks after sleep study with Dr. Silas [name]. If symptoms do not improve or worsen, please contact office for sooner follow up or seek emergency care.     Pulmonary HTN ([place]) WHO Group  2 +/- 3.  She is followed by Dr. Haroldine [name] with advanced heart failure clinic and here with Dr. Silas [name].  Breathing overall stable today.  She is doing well on sildenafil 3 times daily and torsemide twice daily.  No changes to current regimen made today.  Chronic diastolic CHF (congestive heart failure) (HCC) Chronic diastolic CHF with RV dysfunction.  Appears euvolemic on exam today.  Recently decreased carvedilol to 6.25 twice daily.  She continues on this, [place] daily and was recently started on Entresto which she seems to be tolerating well.  Follow-up with Dr. Haroldine [name] as scheduled.  Chronic respiratory failure with hypoxia, on home oxygen therapy (HCC) Stable without increased O2 demand. Continue supplemental oxygen 2 lpm at rest and 3 lpm  with activity for goal >88-90%    I spent 35 minutes of dedicated to the care of this patient on the date of this encounter to include pre-visit review of records, face-to-face time with the patient discussing conditions above, post visit ordering of testing, clinical documentation with the electronic health record, making appropriate referrals as documented, and communicating necessary findings to members of the patients care team.  Clayton Bibles, NP 08/27/2021  Pt aware and understands NP's role.

## 2021-08-27 NOTE — Patient Instructions (Addendum)
Continue Albuterol inhaler 2 puffs or 3 mL neb every 6 hours as needed for shortness of breath or wheezing. Notify if symptoms persist despite rescue inhaler/neb use.  Continue zyrtec 1 tab daily for allergies Continue flonase nasal spray 2 sprays each nostril daily Continue sildenafil 20 mg Three times a day  Continue toresemide 40 mg Twice daily  Continue supplemental oxygen 2 lpm and 3 lpm with exertion for goal oxygen saturation greater than 88-90%  We will need to repeat your sleep study due to inadequate sleep. Someone will contact you to get this scheduled.  Follow up in 6 weeks after sleep study with Dr. Silas Flood. If symptoms do not improve or worsen, please contact office for sooner follow up or seek emergency care.

## 2021-08-27 NOTE — Assessment & Plan Note (Addendum)
She has tracheobroncheomalacia, severe PAH, and fatigue. There was concern for underlying OSA and in lab study was ordered.  She unfortunately was unable to complete this and only spent 8 minutes of the study sleeping.  She was noted to have apneic events during this time period.  Sleep doctor reviewed it and recommended repeat in lab study or home sleep study off supplemental O2.  She would prefer not to go back into the lab again.  I believe she will be able to tolerate being off oxygen for 1 night.  Her oxygen levels were 100% on 2 L today.  She has not had any difficulties with her breathing recently.  We will order urgent HST off oxygen for further evaluation.   We discussed how untreated sleep apnea puts an individual at risk for cardiac arrhthymias, pulm HTN, DM, stroke and increases their risk for daytime accidents.  Patient Instructions  Continue Albuterol inhaler 2 puffs or 3 mL neb every 6 hours as needed for shortness of breath or wheezing. Notify if symptoms persist despite rescue inhaler/neb use.  Continue zyrtec 1 tab daily for allergies Continue flonase nasal spray 2 sprays each nostril daily Continue sildenafil 20 mg Three times a day  Continue toresemide 40 mg Twice daily  Continue supplemental oxygen 2 lpm and 3 lpm with exertion for goal oxygen saturation greater than 88-90%  We will need to repeat your sleep study due to inadequate sleep. Someone will contact you to get this scheduled.  Follow up in 6 weeks after sleep study with Dr. Silas Flood. If symptoms do not improve or worsen, please contact office for sooner follow up or seek emergency care.

## 2021-08-27 NOTE — Assessment & Plan Note (Signed)
Chronic diastolic CHF with RV dysfunction.  Appears euvolemic on exam today.  Recently decreased carvedilol to 6.25 twice daily.  She continues on this, [place] daily and was recently started on Entresto which she seems to be tolerating well.  Follow-up with Dr. Haroldine Laws as scheduled.

## 2021-08-27 NOTE — Assessment & Plan Note (Signed)
WHO Group 2 +/- 3.  She is followed by Dr. Haroldine Laws with advanced heart failure clinic and here with Dr. Silas Flood.  Breathing overall stable today.  She is doing well on sildenafil 3 times daily and torsemide twice daily.  No changes to current regimen made today.

## 2021-09-01 ENCOUNTER — Other Ambulatory Visit (HOSPITAL_COMMUNITY): Payer: Self-pay | Admitting: Physician Assistant

## 2021-09-08 DIAGNOSIS — I10 Essential (primary) hypertension: Secondary | ICD-10-CM | POA: Diagnosis not present

## 2021-09-08 DIAGNOSIS — J4520 Mild intermittent asthma, uncomplicated: Secondary | ICD-10-CM | POA: Diagnosis not present

## 2021-09-08 DIAGNOSIS — Z23 Encounter for immunization: Secondary | ICD-10-CM | POA: Diagnosis not present

## 2021-09-08 DIAGNOSIS — J9601 Acute respiratory failure with hypoxia: Secondary | ICD-10-CM | POA: Diagnosis not present

## 2021-09-08 DIAGNOSIS — M19012 Primary osteoarthritis, left shoulder: Secondary | ICD-10-CM | POA: Diagnosis not present

## 2021-09-08 DIAGNOSIS — E1140 Type 2 diabetes mellitus with diabetic neuropathy, unspecified: Secondary | ICD-10-CM | POA: Diagnosis not present

## 2021-09-14 ENCOUNTER — Ambulatory Visit: Payer: Medicare HMO

## 2021-09-14 DIAGNOSIS — I272 Pulmonary hypertension, unspecified: Secondary | ICD-10-CM

## 2021-09-14 DIAGNOSIS — R0683 Snoring: Secondary | ICD-10-CM

## 2021-09-15 DIAGNOSIS — H401114 Primary open-angle glaucoma, right eye, indeterminate stage: Secondary | ICD-10-CM | POA: Diagnosis not present

## 2021-09-15 DIAGNOSIS — H353130 Nonexudative age-related macular degeneration, bilateral, stage unspecified: Secondary | ICD-10-CM | POA: Diagnosis not present

## 2021-09-15 DIAGNOSIS — H2512 Age-related nuclear cataract, left eye: Secondary | ICD-10-CM | POA: Diagnosis not present

## 2021-09-15 DIAGNOSIS — H43813 Vitreous degeneration, bilateral: Secondary | ICD-10-CM | POA: Diagnosis not present

## 2021-09-21 ENCOUNTER — Ambulatory Visit: Payer: Medicare HMO

## 2021-09-21 DIAGNOSIS — G4733 Obstructive sleep apnea (adult) (pediatric): Secondary | ICD-10-CM

## 2021-09-27 ENCOUNTER — Telehealth: Payer: Self-pay | Admitting: Pulmonary Disease

## 2021-09-27 DIAGNOSIS — G4733 Obstructive sleep apnea (adult) (pediatric): Secondary | ICD-10-CM | POA: Diagnosis not present

## 2021-09-27 NOTE — Telephone Encounter (Signed)
Call patient  Sleep study result  Date of study: 09/21/2021  Impression: Moderate obstructive sleep apnea Moderately severe oxygen desaturations  Was unable to sleep in the sleep lab  Recommendation: DME referral  Recommend CPAP therapy for moderate obstructive sleep apnea  Auto titrating CPAP with pressure settings of 5-20 will be appropriate  Oxygen at 2 L should be added to the system  Repeat overnight oximetry with CPAP and oxygen in place to ascertain oxygen supplementation  Encourage weight loss measures  Follow-up in the office 4 to 6 weeks following initiation of treatment

## 2021-09-28 NOTE — Telephone Encounter (Signed)
I called and spoke with the daughter DPR and she is agreeable to the CPAP and I have placed the order. She did not have any other questions. Nothing further needed.

## 2021-10-06 ENCOUNTER — Ambulatory Visit (INDEPENDENT_AMBULATORY_CARE_PROVIDER_SITE_OTHER): Payer: Medicare HMO | Admitting: Pulmonary Disease

## 2021-10-06 ENCOUNTER — Encounter: Payer: Self-pay | Admitting: Pulmonary Disease

## 2021-10-06 VITALS — BP 132/74 | HR 80 | Temp 98.6°F | Ht 59.0 in | Wt 157.0 lb

## 2021-10-06 DIAGNOSIS — Z79899 Other long term (current) drug therapy: Secondary | ICD-10-CM

## 2021-10-06 DIAGNOSIS — I272 Pulmonary hypertension, unspecified: Secondary | ICD-10-CM | POA: Diagnosis not present

## 2021-10-06 DIAGNOSIS — Z5181 Encounter for therapeutic drug level monitoring: Secondary | ICD-10-CM

## 2021-10-06 LAB — BRAIN NATRIURETIC PEPTIDE: Pro B Natriuretic peptide (BNP): 75 pg/mL (ref 0.0–100.0)

## 2021-10-06 LAB — BASIC METABOLIC PANEL
BUN: 36 mg/dL — ABNORMAL HIGH (ref 6–23)
CO2: 34 mEq/L — ABNORMAL HIGH (ref 19–32)
Calcium: 9.4 mg/dL (ref 8.4–10.5)
Chloride: 97 mEq/L (ref 96–112)
Creatinine, Ser: 1.37 mg/dL — ABNORMAL HIGH (ref 0.40–1.20)
GFR: 36.47 mL/min — ABNORMAL LOW (ref >60.00)
Glucose, Bld: 152 mg/dL — ABNORMAL HIGH (ref 70–99)
Potassium: 3.8 mEq/L (ref 3.5–5.1)
Sodium: 139 mEq/L (ref 135–145)

## 2021-10-06 LAB — MAGNESIUM: Magnesium: 2.5 mg/dL (ref 1.5–2.5)

## 2021-10-06 NOTE — Progress Notes (Signed)
$'@Patient'w$  ID: Mandy [name], female    DOB: 01-May-1940, 81 y.o.   MRN: [id number]  Chief Complaint  Patient presents with   Follow-up    Pt is here for follow up for pulm htn. Pt states she is doing well. Pt wanted to walk w/o o2 to room and she was at 86% but recovered fast to 93% after a few moments. Pt is currently taking Sildenafil and torsemide. No issues noted with medication. Pt states no swelling noted in hands or feet. She states no extra work with breathing.     Referring provider: [name], MD  HPI:   81 y.o. woman whom we are seeing in follow-up for pulmonary hypertension. Most recent pulmonary notes via APP x 2 reviewed.   Overall doing okay.  DOE the same. Weight up 3-4 pounds. LE swelling non existent. Using O2 intermittently. Stressed importance of using as directed. Torsemide 40 mg BID. Only using once a day on may days.  Review of most recent labs reveals stable CKD, potassium levels adequate.  Discussed failing Opsumit in the past likely due to increased preload to stiff ventricle causing pulmonary edema and fluid backup and worsening RV dysfunction.  No plans to retry additional pulmonary vasodilators at this time which was explained.  Questionaires / Pulmonary Flowsheets:   ACT:      No data to display           MMRC:     No data to display           Epworth:     07/08/2021    9:52 AM  Results of the Epworth flowsheet  Sitting and reading 2  Watching TV 2  Sitting, inactive in a public place (e.g. a theatre or a meeting) 2  As a passenger in a car for an hour without a break 2  Lying down to rest in the afternoon when circumstances permit 2  Sitting and talking to someone 1  Sitting quietly after a lunch without alcohol 1  In a car, while stopped for a few minutes in traffic 1  Total score 13    Tests:   FENO:  No results found for: "NITRICOXIDE"  PFT:    Latest Ref Rng & Units 06/17/2021   10:03 AM  PFT Results  FVC-Pre L 0.78    FVC-Predicted Pre % 52   FVC-Post L 0.81   FVC-Predicted Post % 54   Pre FEV1/FVC % % 82   Post FEV1/FCV % % 87   FEV1-Pre L 0.65   FEV1-Predicted Pre % 57   FEV1-Post L 0.71   DLCO uncorrected ml/min/mmHg 6.85   DLCO UNC% % 43   DLVA Predicted % 52   TLC L 4.02   TLC % Predicted % 93   RV % Predicted % 149   Personally reviewed and interpreted spirometry just of moderate restriction versus air trapping.  No bronchodilator spots.  TLC within normal segment elevated RV indicative of air trapping.  DLCO severely reduced.  WALK:     05/26/2021    2:47 PM  SIX MIN WALK  Supplimental Oxygen during Test? (L/min) Yes  O2 Flow Rate 2 L/min  Type Continuous  Tech Comments: patient started on 2 liters and needed 4 liters with exertion. She felt short of breath. she went about 1/2 lap.    Imaging: Personally reviewed and as per EMR discussion this note No results found.  Lab Results: Personally reviewed CBC    Component Value Date/Time  WBC 4.2 06/15/2021 1540   RBC 4.04 06/15/2021 1540   HGB 9.9 (L) 06/15/2021 1540   HCT 32.0 (L) 06/15/2021 1540   HCT 33.2 (L) 02/03/2019 1220   PLT 310 06/15/2021 1540   MCV 79.2 (L) 06/15/2021 1540   MCH 24.5 (L) 06/15/2021 1540   MCHC 30.9 (L) 06/15/2021 1540   RDW 15.2 (H) 06/15/2021 1540   LYMPHSABS 1.2 02/18/2021 0253   MONOABS 0.8 02/18/2021 0253   EOSABS 0.1 02/18/2021 0253   BASOSABS 0.0 02/18/2021 0253    BMET    Component Value Date/Time   NA 140 08/04/2021 0911   NA 142 04/16/2021 1536   K 3.7 08/04/2021 0911   CL 100 08/04/2021 0911   CO2 34 (H) 08/04/2021 0911   GLUCOSE 141 (H) 08/04/2021 0911   BUN 31 (H) 08/04/2021 0911   BUN 41 (H) 04/16/2021 1536   CREATININE 1.35 (H) 08/04/2021 0911   CREATININE 1.49 (H) 06/15/2021 1540   CALCIUM 9.3 08/04/2021 0911   GFRNONAA 40 (L) 08/04/2021 0911   GFRNONAA 43 (L) 07/07/2020 0000   GFRAA 50 (L) 07/07/2020 0000    BNP    Component Value Date/Time   BNP 84.7  07/22/2021 1607    ProBNP    Component Value Date/Time   PROBNP 95.0 03/31/2021 1601    Specialty Problems       Pulmonary Problems   SORE THROAT    Qualifier: Diagnosis of  By: Tomma Lightning MD, Kelly        Acute respiratory failure with hypoxia ([place])   Recurrent right pleural effusion   Hypoxia   Chronic respiratory failure with hypoxia, on home oxygen therapy ([place])   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HCC)   DOE (dyspnea on exertion)   Tracheobronchomalacia    No Known Allergies  Immunization History  Administered Date(s) Administered   Influenza-Unspecified 11/03/2017    Past Medical History:  Diagnosis Date   Aortic atherosclerosis ([place]) 10/08/2018   Arthritis    Chronic kidney disease, stage 3a ([place])    Chronic respiratory failure (HCC)    Diabetes mellitus without complication ([place])    GERD (gastroesophageal reflux disease)    Hepatic steatosis 10/08/2018   Hyperlipidemia    Hypertension    Hyperthyroidism    Mass of right ovary 10/08/2018   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   albuterol (PROVENTIL) (2.5 MG/3ML) 0.083% nebulizer solution Take 2.5 mg by nebulization every 6 (six) hours as needed for wheezing or shortness of breath.   albuterol (VENTOLIN HFA) 108 (90 Base) MCG/ACT inhaler Inhale 2 puffs into the lungs every 6 (six) hours as needed for wheezing or shortness of breath.   alendronate  (FOSAMAX) 70 MG tablet Take 70 mg by mouth once a week.   atorvastatin (LIPITOR) 80 MG tablet Take 1 tablet (80 mg total) by mouth every evening.   calcium  carbonate (OSCAL) 1500 (600 Ca) MG TABS tablet Take 600 mg of elemental calcium by mouth 2 (two) times daily with a meal.   carvedilol (COREG) 6.25 MG tablet Take 1 tablet (6.25 mg total) by mouth 2 (two) times daily with a meal.   cetirizine (ZYRTEC) 10 MG tablet Take 10 mg by mouth daily.   clopidogrel (PLAVIX) 75 MG tablet Take 1 tablet (75 mg total) by mouth daily.   diclofenac sodium (VOLTAREN) 1 % GEL Apply 4 g topically 4 (four) times daily as needed for pain. shoulder   FARXIGA 10 MG TABS tablet Take 10 mg by mouth daily.   fluticasone (FLONASE) 50 MCG/ACT nasal spray Place 1 spray into both nostrils daily.   gabapentin (NEURONTIN) 100 MG capsule Take 100 mg by mouth at bedtime.   guaiFENesin (MUCINEX) 600 MG 12 hr tablet Take 600 mg by mouth 2 (two) times daily as needed.   hydrALAZINE (APRESOLINE) 25 MG tablet Take 25 mg by mouth 3 (three) times daily.   melatonin 5 MG TABS Take 10 mg by mouth at bedtime.   methimazole (TAPAZOLE) 5 MG tablet Take 0.5 tablets (2.5 mg total) by mouth daily.   Multiple Vitamin (MULTIVITAMIN WITH MINERALS) TABS tablet Take 1 tablet by mouth every morning.   Omega-3 Fatty Acids (FISH OIL PO) Take 1 capsule by mouth every morning.   OXYGEN Inhale 4 L into the lungs continuous.   pantoprazole (PROTONIX) 40 MG tablet Take 1 tablet (40 mg total) by mouth daily at 6 (six) AM.   potassium chloride SA (KLOR-CON M) 20 MEQ tablet Take 2 tablets by mouth once daily   sacubitril-valsartan (ENTRESTO) 49-51 MG Take 1 tablet by mouth 2 (two) times daily.   sildenafil (REVATIO) 20 MG tablet Take 1 tablet (20 mg total) by mouth 3 (three) times daily.   Torsemide 40 MG TABS Take 40 mg by mouth 2 (two) times daily.   vitamin B-12 (CYANOCOBALAMIN) 1000 MCG tablet Take 1,000 mcg by mouth daily.   vitamin C (ASCORBIC ACID)  500 MG tablet Take 500 mg by mouth daily.   Vitamin D, Cholecalciferol, 25 MCG (1000 UT) TABS Take 1,000 Units by mouth daily.   zinc gluconate 50 MG tablet Take 50 mg by mouth daily.   No facility-administered encounter medications on file as of 10/06/2021.     Review of Systems  Review of Systems  N/a Physical Exam  BP 132/74 (BP Location: Left Arm, Patient Position: Sitting, Cuff Size: Normal)   Pulse 80   Temp 98.6 F (37 C) (Oral)   Ht '4\' 11"'$  (1.499 m)   Wt 157 lb (71.2 kg)   SpO2 94%   BMI 31.71 kg/m   Wt Readings from Last 5 Encounters:  10/06/21 157 lb (71.2 kg)  08/27/21 153 lb 3.2 oz (69.5 kg)  08/17/21 152 lb (68.9 kg)  07/22/21 152 lb 1.6 oz (69 kg)  07/09/21 152 lb (68.9 kg)    BMI Readings from Last 5 Encounters:  10/06/21 31.71 kg/m  08/27/21 32.02 kg/m  08/17/21 31.77 kg/m  07/22/21 29.70 kg/m  07/09/21 29.69 kg/m     Physical Exam General: Sitting in chair, no effusions Eyes: EOMI, icterus Neck: Supple, no JVP appreciated Pulm: Clear, distant, no work of breathing Cardiovascular: Regular rhythm, no murmur, no lower extremity edema Abdomen: Nondistended, sounds present MSK: No synovitis, joint effusion Neuro: Ambulates with walker, no weakness Psych: Normal mood, full affect   Assessment & Plan:   Pulmonary hypertension: Proven on  right heart cath.  PVR elevated to around 5, suspect Group 1 largest contribution.  Also contribution of group 2 disease given left-sided volume overload on multiple images.  LVEDP at time of cath was 9 which is reassuring.  With good diuresis sounds like we can achieve relatively euvolemic status from the left side of the heart.  VQ scan negative.  Hypoxemia initially contributing, group 3 disease possible, however no parenchymal abnormalities, hypoxemia could be related to pulmonary hypertension alone in the setting of VQ mismatch.   Started sildenafil in the hospital early 2023.  Does think is helped some.  No  flash pulmonary edema, adverse effects at all.  Unfortunately, developed volume overload with ERA, Opsumit.  Tadalafil was also on board at this point in time.  Subsequent switch back to sildenafil monotherapy.  Improved symptoms.  Suspect stiff left ventricle cannot handle additional pulmonary vasodilators.  No plan to add second agent at this time.  Continue torsemide 40 mg twice daily.  Noticed lower extremity swelling on exam.  BNP and repeat labs today for intensive monitoring of diuretic therapy.  Chronic hypoxemic respiratory failure: Intermittently not using.  Ongoing issues with nonadherence.  Expressed how important it is to continue oxygen therapy for pulmonary hypertension.  She requires 4 L at rest and with exertion.  She is to monitor this and increase as needed.  To contact our office if oxygen saturations are worsening.  Told her my goal is 92% or greater with rest and with exertion.   Return in about 3 months (around 01/06/2022).   Lanier Clam, MD 10/06/2021   This appointment required 41 minutes of patient care (this includes precharting, chart review, review of results, face-to-face care, etc.).

## 2021-10-06 NOTE — Progress Notes (Signed)
Labs are stable. No changes need to be made regarding medications.

## 2021-10-06 NOTE — Patient Instructions (Addendum)
Nice to see you again  No change in the medications at this time  Continue sildenafil 3 times a day and torsemide twice a day.  We will check labs today to keep an eye on your kidney function and stress on the heart.  Wear oxygen at all times with exertion. Check at rest and wear if oxygen saturation is below 92%  Return to clinic in 3 months or sooner as needed with Dr. Silas Flood

## 2021-12-08 DIAGNOSIS — I10 Essential (primary) hypertension: Secondary | ICD-10-CM | POA: Diagnosis not present

## 2021-12-08 DIAGNOSIS — R32 Unspecified urinary incontinence: Secondary | ICD-10-CM | POA: Diagnosis not present

## 2021-12-08 DIAGNOSIS — J4520 Mild intermittent asthma, uncomplicated: Secondary | ICD-10-CM | POA: Diagnosis not present

## 2021-12-08 DIAGNOSIS — I5032 Chronic diastolic (congestive) heart failure: Secondary | ICD-10-CM | POA: Diagnosis not present

## 2021-12-08 DIAGNOSIS — J9601 Acute respiratory failure with hypoxia: Secondary | ICD-10-CM | POA: Diagnosis not present

## 2021-12-08 DIAGNOSIS — M179 Osteoarthritis of knee, unspecified: Secondary | ICD-10-CM | POA: Diagnosis not present

## 2021-12-08 DIAGNOSIS — E1140 Type 2 diabetes mellitus with diabetic neuropathy, unspecified: Secondary | ICD-10-CM | POA: Diagnosis not present

## 2021-12-08 DIAGNOSIS — E0590 Thyrotoxicosis, unspecified without thyrotoxic crisis or storm: Secondary | ICD-10-CM | POA: Diagnosis not present

## 2021-12-12 NOTE — Progress Notes (Unsigned)
Cardiology Office Note:    Date:  12/15/2021   ID:  Mandy [name], DOB 09-18-1940, MRN [id number]  PCP:  [name], MD  Cardiologist:  Donato Heinz, MD  Electrophysiologist:  None   Referring MD: [name], MD   No chief complaint on file.   History of Present Illness:    Mandy [name] is a 81 y.o. 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  She has had multiple recent admissions with acute on chronic respiratory failure.  Most recently was admitted from 1/31 through 02/22/2021.  She was admitted with acute on chronic hypoxic/hypercapnic respiratory failure, severe pulmonary hypertension, acute on chronic diastolic heart failure and RV failure.  She required milrinone, with transition to sildenafil.  She was diuresed and ultimately discharged on torsemide.  Most recent echo 02/15/2021 showed EF 55 to 60%, moderate LVH, severely elevated pulmonary systolic pressure (95.1 mmHg), normal RV size, RV function not well visualized, moderate TR.  LHC/RHC 11/30/2020 showed nonobstructive CAD (30% mid LAD, 30% distal LAD), moderate pulm hypertension (RA 2, RV 76/0, PA 72/16/38, PW 18, LVEDP 9, CI 3.6).  Since last clinic visit, she reports she is doing well.  Denies any chest pain, dyspnea, lightheadedness, syncope, lower extremity edema, or palpitations. Reports weight has been stable.    Wt Readings from Last 3 Encounters:  12/15/21 153 lb 6.4 oz (69.6 kg)  10/06/21 157 lb (71.2 kg)  08/27/21 153 lb 3.2 oz (69.5 kg)     Past Medical History:  Diagnosis Date   Aortic atherosclerosis ([place]) 10/08/2018   Arthritis    Chronic kidney disease, stage 3a ([place])    Chronic respiratory failure (HCC)     Diabetes mellitus without complication (HCC)    GERD (gastroesophageal reflux disease)    Hepatic steatosis 10/08/2018   Hyperlipidemia    Hypertension    Hyperthyroidism    Mass of right ovary 10/08/2018   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/w benign thyroid tissue   Mild CAD    Osteoporosis    Pneumonia    Renal infarct (HCC)    SBO (small bowel obstruction) ([place]) 08/09/2018   Severe pulmonary hypertension ([place])    Stroke (cerebrum) (HCC)     Past Surgical History:  Procedure Laterality Date   CATARACT EXTRACTION W/ INTRAOCULAR LENS  IMPLANT, BILATERAL     COLONOSCOPY     LEFT HEART CATH AND CORONARY ANGIOGRAPHY N/A 11/30/2020   Procedure: LEFT HEART CATH AND CORONARY ANGIOGRAPHY;  Surgeon: Early Osmond, MD;  Location: [facility];  Service: Cardiovascular;  Laterality: N/A;   RIGHT/LEFT HEART CATH AND CORONARY ANGIOGRAPHY N/A 11/30/2020   Procedure: RIGHT/LEFT HEART CATH AND CORONARY ANGIOGRAPHY;  Surgeon: Early Osmond, MD;  Location: [facility];  Service: Cardiovascular;  Laterality: N/A;    Current Medications: Current Meds  Medication Sig   acetaminophen (TYLENOL) 500 MG tablet Take 1,000 mg by mouth every 6 (six) hours as needed for fever or headache (pain).   albuterol (PROVENTIL) (2.5 MG/3ML) 0.083% nebulizer solution Take 2.5 mg by nebulization every 6 (six) hours as needed for wheezing or shortness of breath.   albuterol (VENTOLIN HFA) 108 (90 Base) MCG/ACT inhaler Inhale 2 puffs into the lungs every 6 (six) hours as needed for wheezing or shortness  of breath.   alendronate (FOSAMAX) 70 MG tablet Take 70 mg by mouth once a week.   atorvastatin (LIPITOR) 80 MG tablet Take 1 tablet (80 mg total) by mouth every evening.   calcium carbonate (OSCAL) 1500 (600 Ca) MG TABS tablet Take 600 mg of elemental calcium by mouth 2 (two) times daily with a meal.   carvedilol (COREG) 6.25 MG tablet Take 1 tablet (6.25 mg total) by mouth 2 (two) times daily  with a meal.   cetirizine (ZYRTEC) 10 MG tablet Take 10 mg by mouth daily.   clopidogrel (PLAVIX) 75 MG tablet Take 1 tablet (75 mg total) by mouth daily.   diclofenac sodium (VOLTAREN) 1 % GEL Apply 4 g topically 4 (four) times daily as needed for pain. shoulder   FARXIGA 10 MG TABS tablet Take 10 mg by mouth daily.   fluticasone (FLONASE) 50 MCG/ACT nasal spray Place 1 spray into both nostrils daily.   gabapentin (NEURONTIN) 100 MG capsule Take 100 mg by mouth at bedtime.   guaiFENesin (MUCINEX) 600 MG 12 hr tablet Take 600 mg by mouth 2 (two) times daily as needed.   LUMIGAN 0.01 % SOLN Place 1 drop into the right eye at bedtime.   melatonin 5 MG TABS Take 10 mg by mouth at bedtime.   methimazole (TAPAZOLE) 5 MG tablet Take 0.5 tablets (2.5 mg total) by mouth daily.   Multiple Vitamin (MULTIVITAMIN WITH MINERALS) TABS tablet Take 1 tablet by mouth every morning.   Omega-3 Fatty Acids (FISH OIL PO) Take 1 capsule by mouth every morning.   OXYGEN Inhale 4 L into the lungs continuous.   pantoprazole (PROTONIX) 40 MG tablet Take 1 tablet (40 mg total) by mouth daily at 6 (six) AM.   potassium chloride SA (KLOR-CON M) 20 MEQ tablet Take 2 tablets by mouth once daily   sacubitril-valsartan (ENTRESTO) 49-51 MG Take 1 tablet by mouth 2 (two) times daily.   sildenafil (REVATIO) 20 MG tablet Take 1 tablet (20 mg total) by mouth 3 (three) times daily.   Torsemide 40 MG TABS Take 40 mg by mouth 2 (two) times daily.   vitamin B-12 (CYANOCOBALAMIN) 1000 MCG tablet Take 1,000 mcg by mouth daily.   vitamin C (ASCORBIC ACID) 500 MG tablet Take 500 mg by mouth daily.   Vitamin D, Cholecalciferol, 25 MCG (1000 UT) TABS Take 1,000 Units by mouth daily.   zinc gluconate 50 MG tablet Take 50 mg by mouth daily.     Allergies:   Patient has no known allergies.   Social History   Socioeconomic History   Marital status: Widowed    Spouse name: Not on file   Number of children: Not on file   Years of  education: Not on file   Highest education level: Not on file  Occupational History   Not on file  Tobacco Use   Smoking status: Never   Smokeless tobacco: Never  Vaping Use   Vaping Use: Never used  Substance and Sexual Activity   Alcohol use: No   Drug use: No   Sexual activity: Not on file  Other Topics Concern   Not on file  Social History Narrative   Patient is from [place]   Speaks Krio   Social Determinants of Health   Financial Resource Strain: Medium Risk (06/07/2021)   Overall Financial Resource Strain (CARDIA)    Difficulty of Paying Living Expenses: Somewhat hard  Food Insecurity: No Food Insecurity (06/07/2021)   Hunger Vital  Sign    Worried About Charity fundraiser in the Last Year: Never true    [place] in the Last Year: Never true  Transportation Needs: No Transportation Needs (06/07/2021)   PRAPARE - Hydrologist (Medical): No    Lack of Transportation (Non-Medical): No  Physical Activity: Not on file  Stress: Not on file  Social Connections: Not on file     Family History: The patient's family history includes Kidney disease in her son. There is no history of Colon cancer or Breast cancer.  ROS:   Please see the history of present illness.     All other systems reviewed and are negative.  EKGs/Labs/Other Studies Reviewed:    The following studies were reviewed today:   EKG:   12/15/2021: Normal sinus rhythm, rate 66, LVH, Q waves in V1/2  Recent Labs: 04/01/2021: TSH 2.30 05/23/2021: ALT 10 06/15/2021: Hemoglobin 9.9; Platelets 310 07/22/2021: B Natriuretic Peptide 84.7 10/06/2021: BUN 36; Creatinine, Ser 1.37; Magnesium 2.5; Potassium 3.8; Pro B Natriuretic peptide (BNP) 75.0; Sodium 139  Recent Lipid Panel    Component Value Date/Time   CHOL 188 04/01/2021 0000   TRIG 90 04/01/2021 0000   HDL 61 04/01/2021 0000   CHOLHDL 3.1 04/01/2021 0000   VLDL 11 02/05/2021 0243   LDLCALC 109 (H) 04/01/2021 0000     Physical Exam:    VS:  BP (!) 161/82 (BP Location: Left Arm, Patient Position: Sitting, Cuff Size: Large)   Pulse 66   Wt 153 lb 6.4 oz (69.6 kg)   SpO2 97%   BMI 30.98 kg/m     Wt Readings from Last 3 Encounters:  12/15/21 153 lb 6.4 oz (69.6 kg)  10/06/21 157 lb (71.2 kg)  08/27/21 153 lb 3.2 oz (69.5 kg)     GEN:  in no acute distress HEENT: Normal NECK: No JVD; No carotid bruits CARDIAC: RRR, no murmurs, rubs, gallops RESPIRATORY:  Clear to auscultation without rales, wheezing or rhonchi  ABDOMEN: Soft, non-tender, non-distended MUSCULOSKELETAL:  No edema; No deformity  SKIN: Warm and dry NEUROLOGIC:  Alert and oriented x 3 PSYCHIATRIC:  Normal affect   ASSESSMENT:    1. Pulmonary hypertension, unspecified ([place])   2. Chronic diastolic CHF (congestive heart failure) ([place])   3. Essential hypertension   4. Stage 3a chronic kidney disease ([place])     PLAN:    Pulmonary hypertension:  LHC/RHC 11/30/2020 showed nonobstructive CAD (30% mid LAD, 30% distal LAD), severe pulm hypertension (RA 2, RV 76/0, PA 72/16/38, PW 18, LVEDP 9, CI 3.6).  Follows with Dr. Silas Flood.  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  On sildenafil.  On torsemide 40 mg twice daily.  Appears euvolemic  Chronic diastolic heart failure/RV failure: On torsemide 40 mg BID.  Most recent echo 05/2021 EF 55 to 60%, severe LVH, grade 2 diastolic dysfunction, moderate RV dysfunction, PASP 68 mmHg, moderate TR -Appears euvolemic, continue torsemide 40 mg twice daily -Continue Farxiga 10 mg daily -Continue Entresto 49-51 mg twice daily -Continue Coreg 6.25 mg twice daily -Check c-Met, magnesium  CVA: On Plavix, atorvastatin  Hyperlipidemia: On atorvastatin 80 mg daily.  LDL 111 04/01/2021  Hypertension: On carvedilol 6.25 mg twice daily, Entresto 49-51 mg twice daily.  BP elevated in clinic today but daughter reports she thinks she missed dose of medications.  Recommend checking BP twice daily for next week and  calling with results  CKD stage IIIa: Baseline creatinine 1.2-1.3.  Check  CMET  Chronic anemia: Baseline hemoglobin 9-10.  Check CBC   RTC in 6 months  Medication Adjustments/Labs and Tests Ordered: Current medicines are reviewed at length with the patient today.  Concerns regarding medicines are outlined above.  Orders Placed This Encounter  Procedures   Comprehensive metabolic panel   Magnesium   CBC   EKG 12-Lead   No orders of the defined types were placed in this encounter.   Patient Instructions  Medication Instructions:  Your physician recommends that you continue on your current medications as directed. Please refer to the Current Medication list given to you today.  *If you need a refill on your cardiac medications before your next appointment, please call your pharmacy*  Lab Work: CMET, CBC, Mag today  If you have labs (blood work) drawn today and your tests are completely normal, you will receive your results only by: [place] (if you have MyChart) OR A paper copy in the mail If you have any lab test that is abnormal or we need to change your treatment, we will call you to review the results.  Follow-Up: At [facility], you and your health needs are our priority.  As part of our continuing mission to provide you with exceptional heart care, we have created designated Provider Care Teams.  These Care Teams include your primary Cardiologist (physician) and Advanced Practice Providers (APPs -  Physician Assistants and Nurse Practitioners) who all work together to provide you with the care you need, when you need it.  We recommend signing up for the patient portal called "MyChart".  Sign up information is provided on this After Visit Summary.  MyChart is used to connect with patients for Virtual Visits (Telemedicine).  Patients are able to view lab/test results, encounter notes, upcoming appointments, etc.  Non-urgent messages can be sent to your provider  as well.   To learn more about what you can do with MyChart, go to [email or web address].    Your next appointment:   6 month(s)  The format for your next appointment:   In Person  Provider:   Donato Heinz, MD           Signed, Donato Heinz, MD  12/15/2021 9:56 AM    [place]

## 2021-12-15 ENCOUNTER — Ambulatory Visit: Payer: Medicare HMO | Attending: Cardiology | Admitting: Cardiology

## 2021-12-15 ENCOUNTER — Encounter: Payer: Self-pay | Admitting: Cardiology

## 2021-12-15 VITALS — BP 161/82 | HR 66 | Wt 153.4 lb

## 2021-12-15 DIAGNOSIS — N1831 Chronic kidney disease, stage 3a: Secondary | ICD-10-CM

## 2021-12-15 DIAGNOSIS — I5032 Chronic diastolic (congestive) heart failure: Secondary | ICD-10-CM

## 2021-12-15 DIAGNOSIS — I10 Essential (primary) hypertension: Secondary | ICD-10-CM | POA: Diagnosis not present

## 2021-12-15 DIAGNOSIS — I272 Pulmonary hypertension, unspecified: Secondary | ICD-10-CM

## 2021-12-15 NOTE — Patient Instructions (Signed)
Medication Instructions:  Your physician recommends that you continue on your current medications as directed. Please refer to the Current Medication list given to you today.  *If you need a refill on your cardiac medications before your next appointment, please call your pharmacy*  Lab Work: CMET, CBC, Mag today  If you have labs (blood work) drawn today and your tests are completely normal, you will receive your results only by: [place] (if you have MyChart) OR A paper copy in the mail If you have any lab test that is abnormal or we need to change your treatment, we will call you to review the results.  Follow-Up: At [facility], you and your health needs are our priority.  As part of our continuing mission to provide you with exceptional heart care, we have created designated Provider Care Teams.  These Care Teams include your primary Cardiologist (physician) and Advanced Practice Providers (APPs -  Physician Assistants and Nurse Practitioners) who all work together to provide you with the care you need, when you need it.  We recommend signing up for the patient portal called "MyChart".  Sign up information is provided on this After Visit Summary.  MyChart is used to connect with patients for Virtual Visits (Telemedicine).  Patients are able to view lab/test results, encounter notes, upcoming appointments, etc.  Non-urgent messages can be sent to your provider as well.   To learn more about what you can do with MyChart, go to [email or web address].    Your next appointment:   6 month(s)  The format for your next appointment:   In Person  Provider:   Donato Heinz, MD

## 2021-12-16 LAB — COMPREHENSIVE METABOLIC PANEL
ALT: 23 IU/L (ref 0–32)
AST: 23 IU/L (ref 0–40)
Albumin/Globulin Ratio: 1.5 (ref 1.2–2.2)
Albumin: 4.1 g/dL (ref 3.8–4.8)
Alkaline Phosphatase: 64 IU/L (ref 44–121)
BUN/Creatinine Ratio: 23 (ref 12–28)
BUN: 34 mg/dL — ABNORMAL HIGH (ref 8–27)
Bilirubin Total: 0.4 mg/dL (ref 0.0–1.2)
CO2: 29 mmol/L (ref 20–29)
Calcium: 9.9 mg/dL (ref 8.7–10.3)
Chloride: 100 mmol/L (ref 96–106)
Creatinine, Ser: 1.49 mg/dL — ABNORMAL HIGH (ref 0.57–1.00)
Globulin, Total: 2.8 g/dL (ref 1.5–4.5)
Glucose: 115 mg/dL — ABNORMAL HIGH (ref 70–99)
Potassium: 4.1 mmol/L (ref 3.5–5.2)
Sodium: 142 mmol/L (ref 134–144)
Total Protein: 6.9 g/dL (ref 6.0–8.5)
eGFR: 35 mL/min/{1.73_m2} — ABNORMAL LOW (ref >59)

## 2021-12-16 LAB — CBC
Hematocrit: 41.8 % (ref 34.0–46.6)
Hemoglobin: 13.7 g/dL (ref 11.1–15.9)
MCH: 27.8 pg (ref 26.6–33.0)
MCHC: 32.8 g/dL (ref 31.5–35.7)
MCV: 85 fL (ref 79–97)
Platelets: 257 10*3/uL (ref 150–450)
RBC: 4.93 x10E6/uL (ref 3.77–5.28)
RDW: 15.1 % (ref 11.7–15.4)
WBC: 5.5 10*3/uL (ref 3.4–10.8)

## 2021-12-16 LAB — MAGNESIUM: Magnesium: 2.4 mg/dL — ABNORMAL HIGH (ref 1.6–2.3)

## 2021-12-17 ENCOUNTER — Encounter: Payer: Self-pay | Admitting: *Deleted

## 2022-01-21 ENCOUNTER — Ambulatory Visit: Payer: Medicare HMO | Admitting: Pulmonary Disease

## 2022-03-29 ENCOUNTER — Other Ambulatory Visit: Payer: Self-pay | Admitting: Internal Medicine

## 2022-03-29 DIAGNOSIS — Z0000 Encounter for general adult medical examination without abnormal findings: Secondary | ICD-10-CM | POA: Diagnosis not present

## 2022-03-29 DIAGNOSIS — I5032 Chronic diastolic (congestive) heart failure: Secondary | ICD-10-CM | POA: Diagnosis not present

## 2022-03-29 DIAGNOSIS — I10 Essential (primary) hypertension: Secondary | ICD-10-CM | POA: Diagnosis not present

## 2022-03-29 DIAGNOSIS — J9601 Acute respiratory failure with hypoxia: Secondary | ICD-10-CM | POA: Diagnosis not present

## 2022-03-29 DIAGNOSIS — E1140 Type 2 diabetes mellitus with diabetic neuropathy, unspecified: Secondary | ICD-10-CM | POA: Diagnosis not present

## 2022-03-29 DIAGNOSIS — J4520 Mild intermittent asthma, uncomplicated: Secondary | ICD-10-CM | POA: Diagnosis not present

## 2022-03-30 LAB — COMPLETE METABOLIC PANEL WITH GFR
AG Ratio: 1.2 (calc) (ref 1.0–2.5)
ALT: 12 U/L (ref 6–29)
AST: 22 U/L (ref 10–35)
Albumin: 3.9 g/dL (ref 3.6–5.1)
Alkaline phosphatase (APISO): 59 U/L (ref 37–153)
BUN/Creatinine Ratio: 30 (calc) — ABNORMAL HIGH (ref 6–22)
BUN: 44 mg/dL — ABNORMAL HIGH (ref 7–25)
CO2: 28 mmol/L (ref 20–32)
Calcium: 9.5 mg/dL (ref 8.6–10.4)
Chloride: 101 mmol/L (ref 98–110)
Creat: 1.48 mg/dL — ABNORMAL HIGH (ref 0.60–0.95)
Globulin: 3.3 g/dL (calc) (ref 1.9–3.7)
Glucose, Bld: 108 mg/dL — ABNORMAL HIGH (ref 65–99)
Potassium: 4.2 mmol/L (ref 3.5–5.3)
Sodium: 142 mmol/L (ref 135–146)
Total Bilirubin: 0.3 mg/dL (ref 0.2–1.2)
Total Protein: 7.2 g/dL (ref 6.1–8.1)
eGFR: 36 mL/min/{1.73_m2} — ABNORMAL LOW (ref >=60)

## 2022-03-30 LAB — CBC
HCT: 41.4 % (ref 35.0–45.0)
Hemoglobin: 13.5 g/dL (ref 11.7–15.5)
MCH: 27.4 pg (ref 27.0–33.0)
MCHC: 32.6 g/dL (ref 32.0–36.0)
MCV: 84.1 fL (ref 80.0–100.0)
MPV: 11.7 fL (ref 7.5–12.5)
Platelets: 281 10*3/uL (ref 140–400)
RBC: 4.92 10*6/uL (ref 3.80–5.10)
RDW: 14.2 % (ref 11.0–15.0)
WBC: 5.4 10*3/uL (ref 3.8–10.8)

## 2022-03-30 LAB — TSH: TSH: 0.97 mIU/L (ref 0.40–4.50)

## 2022-05-17 ENCOUNTER — Telehealth: Payer: Self-pay

## 2022-05-17 NOTE — Telephone Encounter (Signed)
**Note De-Identified Mandy [name] Obfuscation** The pt is currently followed by [facility] and the Nocturnal polysomnography was completed on 09/21/2021.

## 2022-06-23 DIAGNOSIS — H43813 Vitreous degeneration, bilateral: Secondary | ICD-10-CM | POA: Diagnosis not present

## 2022-06-23 DIAGNOSIS — H2512 Age-related nuclear cataract, left eye: Secondary | ICD-10-CM | POA: Diagnosis not present

## 2022-06-23 DIAGNOSIS — H401114 Primary open-angle glaucoma, right eye, indeterminate stage: Secondary | ICD-10-CM | POA: Diagnosis not present

## 2022-06-24 ENCOUNTER — Other Ambulatory Visit (HOSPITAL_COMMUNITY): Payer: Self-pay | Admitting: Internal Medicine

## 2022-06-24 ENCOUNTER — Ambulatory Visit: Payer: Medicare HMO | Attending: Cardiology | Admitting: Cardiology

## 2022-06-24 ENCOUNTER — Encounter: Payer: Self-pay | Admitting: Cardiology

## 2022-06-24 VITALS — BP 150/80 | HR 75 | Ht 59.0 in | Wt 162.6 lb

## 2022-06-24 DIAGNOSIS — I10 Essential (primary) hypertension: Secondary | ICD-10-CM | POA: Diagnosis not present

## 2022-06-24 DIAGNOSIS — I5032 Chronic diastolic (congestive) heart failure: Secondary | ICD-10-CM | POA: Diagnosis not present

## 2022-06-24 DIAGNOSIS — I2510 Atherosclerotic heart disease of native coronary artery without angina pectoris: Secondary | ICD-10-CM

## 2022-06-24 DIAGNOSIS — N1831 Chronic kidney disease, stage 3a: Secondary | ICD-10-CM | POA: Diagnosis not present

## 2022-06-24 LAB — BASIC METABOLIC PANEL
BUN/Creatinine Ratio: 29 — ABNORMAL HIGH (ref 12–28)
BUN: 39 mg/dL — ABNORMAL HIGH (ref 8–27)
CO2: 25 mmol/L (ref 20–29)
Calcium: 9.8 mg/dL (ref 8.7–10.3)
Chloride: 101 mmol/L (ref 96–106)
Creatinine, Ser: 1.36 mg/dL — ABNORMAL HIGH (ref 0.57–1.00)
Glucose: 127 mg/dL — ABNORMAL HIGH (ref 70–99)
Potassium: 4.7 mmol/L (ref 3.5–5.2)
Sodium: 141 mmol/L (ref 134–144)
eGFR: 39 mL/min/{1.73_m2} — ABNORMAL LOW (ref >59)

## 2022-06-24 LAB — MAGNESIUM: Magnesium: 2.5 mg/dL — ABNORMAL HIGH (ref 1.6–2.3)

## 2022-06-24 NOTE — Progress Notes (Unsigned)
Cardiology Office Note:    Date:  06/27/2022   ID:  Mandy [name], DOB December 13, 1940, MRN [id number]  PCP:  Fleet Contras, MD  Cardiologist:  Little Ishikawa, MD  Electrophysiologist:  None   Referring MD: Fleet Contras, MD   Chief Complaint  Patient presents with   Congestive Heart Failure    History of Present Illness:    Mandy [name] is a 82 y.o. 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  She has had multiple recent admissions with acute on chronic respiratory failure.  Most recently was admitted from 1/31 through 02/22/2021.  She was admitted with acute on chronic hypoxic/hypercapnic respiratory failure, severe pulmonary hypertension, acute on chronic diastolic heart failure and RV failure.  She required milrinone, with transition to sildenafil.  She was diuresed and ultimately discharged on torsemide.  Most recent echo 02/15/2021 showed EF 55 to 60%, moderate LVH, severely elevated pulmonary systolic pressure (61.4 mmHg), normal RV size, RV function not well visualized, moderate TR.  LHC/RHC 11/30/2020 showed nonobstructive CAD (30% mid LAD, 30% distal LAD), moderate pulm hypertension (RA 2, RV 76/0, PA 72/16/38, PW 18, LVEDP 9, CI 3.6).  Since last clinic visit, she reports she is doing OK.  Denies any chest pain, dyspnea, lower extremity edema, or palpitations.  Reported some lightheadedness but denies any syncope.  Did not take her Entresto this morning.  Reports weight is stable.  Wt Readings from Last 3 Encounters:  06/24/22 162 lb 9.6 oz (73.8 kg)  12/15/21 153 lb 6.4 oz (69.6 kg)  10/06/21 157 lb (71.2 kg)     Past Medical History:  Diagnosis Date   Aortic atherosclerosis (HCC) 10/08/2018    Arthritis    Chronic kidney disease, stage 3a (HCC)    Chronic respiratory failure (HCC)    Diabetes mellitus without complication (HCC)    GERD (gastroesophageal reflux disease)    Hepatic steatosis 10/08/2018   Hyperlipidemia    Hypertension    Hyperthyroidism    Mass of right ovary 10/08/2018   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/w benign thyroid tissue   Mild CAD    Osteoporosis    Pneumonia    Renal infarct (HCC)    SBO (small bowel obstruction) (HCC) 08/09/2018   Severe pulmonary hypertension (HCC)    Stroke (cerebrum) (HCC)     Past Surgical History:  Procedure Laterality Date   CATARACT EXTRACTION W/ INTRAOCULAR LENS  IMPLANT, BILATERAL     COLONOSCOPY     LEFT HEART CATH AND CORONARY ANGIOGRAPHY N/A 11/30/2020   Procedure: LEFT HEART CATH AND CORONARY ANGIOGRAPHY;  Surgeon: Orbie Pyo, MD;  Location: [facility];  Service: Cardiovascular;  Laterality: N/A;   RIGHT/LEFT HEART CATH AND CORONARY ANGIOGRAPHY N/A 11/30/2020   Procedure: RIGHT/LEFT HEART CATH AND CORONARY ANGIOGRAPHY;  Surgeon: Orbie Pyo, MD;  Location: [facility]AS[facility] CV [facility];  Service: Cardiovascular;  Laterality: N/A;    Current Medications: Current Meds  Medication Sig   acetaminophen (TYLENOL) 500 MG tablet Take 1,000 mg by mouth every 6 (six) hours as needed for fever or headache (pain).   albuterol (PROVENTIL) (2.5 MG/3ML) 0.083% nebulizer solution Take 2.5 mg by nebulization every 6 (six) hours as needed for wheezing or shortness of breath.   albuterol (VENTOLIN HFA) 108 (90  Base) MCG/ACT inhaler Inhale 2 puffs into the lungs every 6 (six) hours as needed for wheezing or shortness of breath.   alendronate (FOSAMAX) 70 MG tablet Take 70 mg by mouth once a week.   atorvastatin (LIPITOR) 80 MG tablet Take 1 tablet (80 mg total) by mouth every evening.   calcium carbonate (OSCAL) 1500 (600 Ca) MG TABS tablet Take 600 mg of elemental calcium by mouth 2 (two) times daily with a meal.    cetirizine (ZYRTEC) 10 MG tablet Take 10 mg by mouth daily.   clopidogrel (PLAVIX) 75 MG tablet Take 1 tablet (75 mg total) by mouth daily.   diclofenac sodium (VOLTAREN) 1 % GEL Apply 4 g topically 4 (four) times daily as needed for pain. shoulder   FARXIGA 10 MG TABS tablet Take 10 mg by mouth daily.   fluticasone (FLONASE) 50 MCG/ACT nasal spray Place 1 spray into both nostrils daily.   gabapentin (NEURONTIN) 100 MG capsule Take 100 mg by mouth at bedtime.   guaiFENesin (MUCINEX) 600 MG 12 hr tablet Take 600 mg by mouth 2 (two) times daily as needed.   LUMIGAN 0.01 % SOLN Place 1 drop into the right eye at bedtime.   melatonin 5 MG TABS Take 10 mg by mouth at bedtime.   methimazole (TAPAZOLE) 5 MG tablet Take 0.5 tablets (2.5 mg total) by mouth daily.   Multiple Vitamin (MULTIVITAMIN WITH MINERALS) TABS tablet Take 1 tablet by mouth every morning.   Omega-3 Fatty Acids (FISH OIL PO) Take 1 capsule by mouth every morning.   OXYGEN Inhale 4 L into the lungs continuous.   pantoprazole (PROTONIX) 40 MG tablet Take 1 tablet (40 mg total) by mouth daily at 6 (six) AM.   potassium chloride SA (KLOR-CON M) 20 MEQ tablet Take 2 tablets by mouth once daily   sacubitril-valsartan (ENTRESTO) 49-51 MG Take 1 tablet by mouth 2 (two) times daily.   sildenafil (REVATIO) 20 MG tablet Take 1 tablet (20 mg total) by mouth 3 (three) times daily.   Torsemide 40 MG TABS Take 40 mg by mouth 2 (two) times daily.   vitamin B-12 (CYANOCOBALAMIN) 1000 MCG tablet Take 1,000 mcg by mouth daily.   vitamin C (ASCORBIC ACID) 500 MG tablet Take 500 mg by mouth daily.   Vitamin D, Cholecalciferol, 25 MCG (1000 UT) TABS Take 1,000 Units by mouth daily.   zinc gluconate 50 MG tablet Take 50 mg by mouth daily.   [DISCONTINUED] carvedilol (COREG) 6.25 MG tablet Take 1 tablet (6.25 mg total) by mouth 2 (two) times daily with a meal.     Allergies:   Patient has no known allergies.   Social History   Socioeconomic History    Marital status: Widowed    Spouse name: Not on file   Number of children: Not on file   Years of education: Not on file   Highest education level: Not on file  Occupational History   Not on file  Tobacco Use   Smoking status: Never   Smokeless tobacco: Never  Vaping Use   Vaping Use: Never used  Substance and Sexual Activity   Alcohol use: No   Drug use: No   Sexual activity: Not on file  Other Topics Concern   Not on file  Social History Narrative   Patient is from [place]   Social Determinants of Health   Financial Resource Strain: Medium Risk (06/07/2021)   Overall Financial Resource Strain (CARDIA)  Difficulty of Paying Living Expenses: Somewhat hard  Food Insecurity: No Food Insecurity (06/07/2021)   Hunger Vital Sign    Worried About Running Out of Food in the Last Year: Never true    Ran Out of Food in the Last Year: Never true  Transportation Needs: No Transportation Needs (06/07/2021)   PRAPARE - Administrator, Civil Service (Medical): No    Lack of Transportation (Non-Medical): No  Physical Activity: Not on file  Stress: Not on file  Social Connections: Not on file     Family History: The patient's family history includes Kidney disease in her son. There is no history of Colon cancer or Breast cancer.  ROS:   Please see the history of present illness.     All other systems reviewed and are negative.  EKGs/Labs/Other Studies Reviewed:    The following studies were reviewed today:   EKG:   12/15/2021: Normal sinus rhythm, rate 66, LVH, Q waves in V1/2  Recent Labs: 07/22/2021: B Natriuretic Peptide 84.7 10/06/2021: Pro B Natriuretic peptide (BNP) 75.0 03/29/2022: ALT 12; Hemoglobin 13.5; Platelets 281; TSH 0.97 06/24/2022: BUN 39; Creatinine, Ser 1.36; Magnesium 2.5; Potassium 4.7; Sodium 141  Recent Lipid Panel    Component Value Date/Time   CHOL 188 04/01/2021 0000   TRIG 90 04/01/2021 0000   HDL 61 04/01/2021 0000    CHOLHDL 3.1 04/01/2021 0000   VLDL 11 02/05/2021 0243   LDLCALC 109 (H) 04/01/2021 0000    Physical Exam:    VS:  BP (!) 150/80   Pulse 75   Ht 4\' 11"  (1.499 m)   Wt 162 lb 9.6 oz (73.8 kg)   SpO2 95%   BMI 32.84 kg/m     Wt Readings from Last 3 Encounters:  06/24/22 162 lb 9.6 oz (73.8 kg)  12/15/21 153 lb 6.4 oz (69.6 kg)  10/06/21 157 lb (71.2 kg)     GEN:  in no acute distress HEENT: Normal NECK: No JVD; No carotid bruits CARDIAC: RRR, no murmurs, rubs, gallops RESPIRATORY:  Clear to auscultation without rales, wheezing or rhonchi  ABDOMEN: Soft, non-tender, non-distended MUSCULOSKELETAL:  No edema; No deformity  SKIN: Warm and dry NEUROLOGIC:  Alert and oriented x 3 PSYCHIATRIC:  Normal affect   ASSESSMENT:    1. Chronic diastolic CHF (congestive heart failure) (HCC)   2. Essential hypertension   3. Stage 3a chronic kidney disease (HCC)   4. Coronary artery disease involving native coronary artery of native heart without angina pectoris      PLAN:    Pulmonary hypertension:  LHC/RHC 11/30/2020 showed nonobstructive CAD (30% mid LAD, 30% distal LAD), severe pulm hypertension (RA 2, RV 76/0, PA 72/16/38, PW 18, LVEDP 9, CI 3.6).  Follows with Dr. Judeth Horn.  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  On sildenafil.  On torsemide 40 mg twice daily.  Appears euvolemic  Chronic diastolic heart failure/RV failure: On torsemide 40 mg BID.  Most recent echo 05/2021 EF 55 to 60%, severe LVH, grade 2 diastolic dysfunction, moderate RV dysfunction, PASP 68 mmHg, moderate TR -Appears euvolemic, continue torsemide 40 mg twice daily -Continue Farxiga 10 mg daily -Continue Entresto 49-51 mg twice daily -Continue Coreg 6.25 mg twice daily -Check BMET, magnesium  CVA: On Plavix, atorvastatin  Hyperlipidemia: On atorvastatin 80 mg daily.  LDL 111 04/01/2021  Hypertension: On carvedilol 6.25 mg twice daily, Entresto 49-51 mg twice daily.  BP elevated in clinic, but reports did not take  Entresto today.  Recommend  taking medications as prescribed.  Would recommend checking BP daily for next 2 weeks and would bring BP log and home monitor to calibrate to appointment in pharmacy hypertension clinic in 2 weeks  CKD stage IIIa: Baseline creatinine 1.2-1.3.  Check BMET  Chronic anemia: Has improved, most recent hemoglobin 13.5 03/2022  OSA: on CPAP, reports compliance   RTC in 6 months  Medication Adjustments/Labs and Tests Ordered: Current medicines are reviewed at length with the patient today.  Concerns regarding medicines are outlined above.  Orders Placed This Encounter  Procedures   Basic metabolic panel   Magnesium   AMB Referral to [facility]   No orders of the defined types were placed in this encounter.   Patient Instructions  Other Instructions    Please purchases  an upper arm  ( brand name -- Omron) blood pressure machine  Record blood pressure  daily for 2 weeks and bring - reading to appointment   Medication Instructions:   No changes in medication  *If you need a refill on your cardiac medications before your next appointment, please call your pharmacy*   Lab Work:today  BMP Magnesium  If you have labs (blood work) drawn today and your tests are completely normal, you will receive your results only by: MyChart Message (if you have MyChart) OR A paper copy in the mail If you have any lab test that is abnormal or we need to change your treatment, we will call you to review the results.   Testing/Procedures:    Follow-Up: At [facility], you and your health needs are our priority.  As part of our continuing mission to provide you with exceptional heart care, we have created designated Provider Care Teams.  These Care Teams include your primary Cardiologist (physician) and Advanced Practice Providers (APPs -  Physician Assistants and Nurse Practitioners) who all work together to provide you with the care you need, when you need  it.     Your next appointment:   6 month(s)  The format for your next appointment:   In Person  Provider:   Dr Epifanio Lesches   You have been referred to [facility] in 2 weeks for Blood pressure clinic  - please bring  your new  blood  machine to the visit  . Take your morning medication before coming to appointment  Other Instructions    Please purchases  an upper arm  ( brand name -- Omron) blood pressure machine  Record blood pressure  daily for 2 weeks ( 14 days)  and bring - reading to appointment   Signed, Little Ishikawa, MD  06/27/2022 6:27 AM    [facility]

## 2022-06-24 NOTE — Patient Instructions (Addendum)
Other Instructions    Please purchases  an upper arm  ( brand name -- Omron) blood pressure machine  Record blood pressure  daily for 2 weeks and bring - reading to appointment   Medication Instructions:   No changes in medication  *If you need a refill on your cardiac medications before your next appointment, please call your pharmacy*   Lab Work:today  BMP Magnesium  If you have labs (blood work) drawn today and your tests are completely normal, you will receive your results only by: MyChart Message (if you have MyChart) OR A paper copy in the mail If you have any lab test that is abnormal or we need to change your treatment, we will call you to review the results.   Testing/Procedures:    Follow-Up: At [facility], you and your health needs are our priority.  As part of our continuing mission to provide you with exceptional heart care, we have created designated Provider Care Teams.  These Care Teams include your primary Cardiologist (physician) and Advanced Practice Providers (APPs -  Physician Assistants and Nurse Practitioners) who all work together to provide you with the care you need, when you need it.     Your next appointment:   6 month(s)  The format for your next appointment:   In Person  Provider:   Dr Epifanio Lesches   You have been referred to [facility] in 2 weeks for Blood pressure clinic  - please bring  your new  blood  machine to the visit  . Take your morning medication before coming to appointment  Other Instructions    Please purchases  an upper arm  ( brand name -- Omron) blood pressure machine  Record blood pressure  daily for 2 weeks ( 14 days)  and bring - reading to appointment

## 2022-06-30 ENCOUNTER — Ambulatory Visit (INDEPENDENT_AMBULATORY_CARE_PROVIDER_SITE_OTHER): Payer: Medicare HMO | Admitting: Pulmonary Disease

## 2022-06-30 ENCOUNTER — Encounter: Payer: Self-pay | Admitting: Pulmonary Disease

## 2022-06-30 DIAGNOSIS — I272 Pulmonary hypertension, unspecified: Secondary | ICD-10-CM | POA: Diagnosis not present

## 2022-06-30 MED ORDER — SILDENAFIL CITRATE 20 MG PO TABS: 20.0000 mg | ORAL_TABLET | Freq: Three times a day (TID) | ORAL | 11 refills | Status: DC

## 2022-06-30 NOTE — Progress Notes (Signed)
@Patient  ID: Mandy [name], female    DOB: 11/15/1940, 82 y.o.   MRN: [id number]  Chief Complaint  Patient presents with   Follow-up    C/o feeling of heartburn and GERD started this morning.  Otherwise, no complaints per patients daughter.  Patient only using meds prn.  Patient forgets to take medications some days.  Did forget protonix today    Referring provider: Fleet Contras, MD  HPI:   82 y.o. woman whom we are seeing in follow-up for pulmonary hypertension. Most recent cardiology notes x 2 reviewed.   Overall doing okay.  DOE the same. Weight up 3-4 pounds again. LE swelling non existent. Using O2 intermittently.  Seems like rarely.  Does not have daytime oxygen or portable oxygen probably due to nonadherence and it being removed via [employer].  Does not appear to be on torsemide anymore.  Dyspnea unchanged.  Complains of heartburn.  Reports good adherence with sildenafil.  Seems like a prescription expired a few months ago.  Questionaires / Pulmonary Flowsheets:   ACT:      No data to display           MMRC:     No data to display           Epworth:     07/08/2021    9:52 AM  Results of the Epworth flowsheet  Sitting and reading 2  Watching TV 2  Sitting, inactive in a public place (e.g. a theatre or a meeting) 2  As a passenger in a car for an hour without a break 2  Lying down to rest in the afternoon when circumstances permit 2  Sitting and talking to someone 1  Sitting quietly after a lunch without alcohol 1  In a car, while stopped for a few minutes in traffic 1  Total score 13    Tests:   FENO:  No results found for: "NITRICOXIDE"  PFT:    Latest Ref Rng & Units 06/17/2021   10:03 AM  PFT Results  FVC-Pre L 0.78   FVC-Predicted Pre % 52   FVC-Post L 0.81   FVC-Predicted Post % 54   Pre FEV1/FVC % % 82   Post FEV1/FCV % % 87   FEV1-Pre L 0.65   FEV1-Predicted Pre % 57   FEV1-Post L 0.71   DLCO uncorrected ml/min/mmHg 6.85    DLCO UNC% % 43   DLVA Predicted % 52   TLC L 4.02   TLC % Predicted % 93   RV % Predicted % 149   Personally reviewed and interpreted spirometry just of moderate restriction versus air trapping.  No bronchodilator spots.  TLC within normal segment elevated RV indicative of air trapping.  DLCO severely reduced.  WALK:     05/26/2021    2:47 PM  SIX MIN WALK  Supplimental Oxygen during Test? (L/min) Yes  O2 Flow Rate 2 L/min  Type Continuous  Tech Comments: patient started on 2 liters and needed 4 liters with exertion. She felt short of breath. she went about 1/2 lap.    Imaging: Personally reviewed and as per EMR discussion this note No results found.  Lab Results: Personally reviewed CBC    Component Value Date/Time   WBC 5.4 03/29/2022 0000   RBC 4.92 03/29/2022 0000   HGB 13.5 03/29/2022 0000   HGB 13.7 12/15/2021 1007   HCT 41.4 03/29/2022 0000   HCT 41.8 12/15/2021 1007   PLT 281 03/29/2022 0000  PLT 257 12/15/2021 1007   MCV 84.1 03/29/2022 0000   MCV 85 12/15/2021 1007   MCH 27.4 03/29/2022 0000   MCHC 32.6 03/29/2022 0000   RDW 14.2 03/29/2022 0000   RDW 15.1 12/15/2021 1007   LYMPHSABS 1.2 02/18/2021 0253   MONOABS 0.8 02/18/2021 0253   EOSABS 0.1 02/18/2021 0253   BASOSABS 0.0 02/18/2021 0253    BMET    Component Value Date/Time   NA 141 06/24/2022 1028   K 4.7 06/24/2022 1028   CL 101 06/24/2022 1028   CO2 25 06/24/2022 1028   GLUCOSE 127 (H) 06/24/2022 1028   GLUCOSE 108 (H) 03/29/2022 0000   BUN 39 (H) 06/24/2022 1028   CREATININE 1.36 (H) 06/24/2022 1028   CREATININE 1.48 (H) 03/29/2022 0000   CALCIUM 9.8 06/24/2022 1028   GFRNONAA 40 (L) 08/04/2021 0911   GFRNONAA 43 (L) 07/07/2020 0000   GFRAA 50 (L) 07/07/2020 0000    BNP    Component Value Date/Time   BNP 84.7 07/22/2021 1607    ProBNP    Component Value Date/Time   PROBNP 75.0 10/06/2021 1040    Specialty Problems       Pulmonary Problems   SORE THROAT    Qualifier:  Diagnosis of  By: Philipp Deputy MD, Kelly        Acute respiratory failure with hypoxia (HCC)   Recurrent right pleural effusion   Hypoxia   Chronic respiratory failure with hypoxia, on home oxygen therapy (HCC)   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HCC)   DOE (dyspnea on exertion)   Tracheobronchomalacia    No Known Allergies  Immunization History  Administered Date(s) Administered   Influenza-Unspecified 11/03/2017    Past Medical History:  Diagnosis Date   Aortic atherosclerosis (HCC) 10/08/2018   Arthritis    Chronic kidney disease, stage 3a (HCC)    Chronic respiratory failure (HCC)    Diabetes mellitus without complication (HCC)    GERD (gastroesophageal reflux disease)    Hepatic steatosis 10/08/2018   Hyperlipidemia    Hypertension    Hyperthyroidism    Mass of right ovary 10/08/2018   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   albuterol (PROVENTIL) (2.5 MG/3ML) 0.083% nebulizer solution Take 2.5 mg by nebulization every 6 (six) hours as needed for wheezing or shortness of breath.   albuterol (VENTOLIN HFA) 108 (90 Base) MCG/ACT inhaler Inhale 2 puffs into the lungs every 6 (six) hours as needed for wheezing or shortness of breath.   alendronate (FOSAMAX) 70 MG tablet Take 70 mg by mouth once a week.   atorvastatin (LIPITOR) 80 MG tablet Take 1 tablet (80 mg total) by mouth every evening.   calcium carbonate (OSCAL) 1500  (600 Ca) MG TABS tablet Take 600 mg of elemental calcium by mouth 2 (two) times daily with a meal.   carvedilol (COREG) 6.25 MG tablet Take 1  tablet (6.25 mg total) by mouth 2 (two) times daily with a meal. NEEDS FOLLOW UP FOR MORE REFILLS   cetirizine (ZYRTEC) 10 MG tablet Take 10 mg by mouth daily.   clopidogrel (PLAVIX) 75 MG tablet Take 1 tablet (75 mg total) by mouth daily.   diclofenac sodium (VOLTAREN) 1 % GEL Apply 4 g topically 4 (four) times daily as needed for pain. shoulder   FARXIGA 10 MG TABS tablet Take 10 mg by mouth daily.   fluticasone (FLONASE) 50 MCG/ACT nasal spray Place 1 spray into both nostrils daily.   gabapentin (NEURONTIN) 100 MG capsule Take 100 mg by mouth at bedtime.   guaiFENesin (MUCINEX) 600 MG 12 hr tablet Take 600 mg by mouth 2 (two) times daily as needed.   LUMIGAN 0.01 % SOLN Place 1 drop into the right eye at bedtime.   melatonin 5 MG TABS Take 10 mg by mouth at bedtime.   methimazole (TAPAZOLE) 5 MG tablet Take 0.5 tablets (2.5 mg total) by mouth daily.   Multiple Vitamin (MULTIVITAMIN WITH MINERALS) TABS tablet Take 1 tablet by mouth every morning.   Omega-3 Fatty Acids (FISH OIL PO) Take 1 capsule by mouth every morning.   OXYGEN Inhale 4 L into the lungs continuous.   pantoprazole (PROTONIX) 40 MG tablet Take 1 tablet (40 mg total) by mouth daily at 6 (six) AM.   potassium chloride SA (KLOR-CON M) 20 MEQ tablet Take 2 tablets by mouth once daily   sacubitril-valsartan (ENTRESTO) 49-51 MG Take 1 tablet by mouth 2 (two) times daily.   Torsemide 40 MG TABS Take 40 mg by mouth 2 (two) times daily.   vitamin B-12 (CYANOCOBALAMIN) 1000 MCG tablet Take 1,000 mcg by mouth daily.   vitamin C (ASCORBIC ACID) 500 MG tablet Take 500 mg by mouth daily.   Vitamin D, Cholecalciferol, 25 MCG (1000 UT) TABS Take 1,000 Units by mouth daily.   zinc gluconate 50 MG tablet Take 50 mg by mouth daily.   [DISCONTINUED] sildenafil (REVATIO) 20 MG tablet Take 1 tablet (20 mg  total) by mouth 3 (three) times daily.   sildenafil (REVATIO) 20 MG tablet Take 1 tablet (20 mg total) by mouth 3 (three) times daily.   No facility-administered encounter medications on file as of 06/30/2022.     Review of Systems  Review of Systems  N/a Physical Exam  BP 138/86 (BP Location: Left Arm, Patient Position: Sitting, Cuff Size: Large)   Pulse 84   Temp 98.3 F (36.8 C) (Oral)   Ht 4\' 11"  (1.499 m)   Wt 162 lb 3.2 oz (73.6 kg)   SpO2 96% Comment: room air  BMI 32.76 kg/m   Wt Readings from Last 5 Encounters:  06/30/22 162 lb 3.2 oz (73.6 kg)  06/24/22 162 lb 9.6 oz (73.8 kg)  12/15/21 153 lb 6.4 oz (69.6 kg)  10/06/21 157 lb (71.2 kg)  08/27/21 153 lb 3.2 oz (69.5 kg)    BMI Readings from Last 5 Encounters:  06/30/22 32.76 kg/m  06/24/22 32.84 kg/m  12/15/21 30.98 kg/m  10/06/21 31.71 kg/m  08/27/21 32.02 kg/m     Physical Exam General: Sitting in chair, no effusions Eyes: EOMI, icterus Neck: Supple, no JVP appreciated Pulm: Clear, distant, no work of breathing Cardiovascular: Regular rhythm, no murmur, no lower extremity edema Abdomen: Nondistended, sounds present MSK: No synovitis, joint effusion Neuro: Ambulates with walker, no weakness Psych: Normal mood, full affect   Assessment & Plan:   Pulmonary hypertension: Proven  on right heart cath.  PVR elevated to around 5, suspect Group 1 largest contribution.  Also contribution of group 2 disease given left-sided volume overload on multiple images.  LVEDP at time of cath was 9 which is reassuring.  With good diuresis sounds like we can achieve relatively euvolemic status from the left side of the heart.  VQ scan negative.  Hypoxemia initially contributing, group 3 disease possible, however no parenchymal abnormalities, hypoxemia could be related to pulmonary hypertension alone in the setting of VQ mismatch.   Started sildenafil in the hospital early 2023.  Does think is helped some.  No flash  pulmonary edema, adverse effects at all.  Unfortunately, developed volume overload with ERA, Opsumit.  Tadalafil was also on board at this point in time.  Subsequent switch back to sildenafil monotherapy.  Improved symptoms.  Suspect stiff left ventricle cannot handle additional pulmonary vasodilators.  No plan to add second agent at this time.  Unclear if still taking sildenafil given prescription data.  Refill today, 1 year supply.  Chronic hypoxemic respiratory failure: Not using reliably.  Ongoing issues with nonadherence.  Expressed how important it is to continue oxygen therapy for pulmonary hypertension.    Return in about 6 months (around 12/30/2022).   Karren Burly, MD 06/30/2022   This appointment required 40 minutes of patient care (this includes precharting, chart review, review of results, face-to-face care, etc.).

## 2022-06-30 NOTE — Patient Instructions (Signed)
Nice to see you again  I refilled the sildenafil, take 1 tablet 3 times a day  Try using the heartburn pill before dinner instead of breakfast.  It may be easier to remember before dinner since having trouble remembering before breakfast.  We will place new orders for CPAP supplies  Return to clinic in 6 months or sooner as needed with Dr. Judeth Horn

## 2022-07-22 NOTE — Progress Notes (Unsigned)
Patient ID: Mandy [name]                 DOB: 1940/10/04                      MRN: [id number]      HPI: Jesicca [name] is a 82 y.o. female referred by Dr. Bjorn Pippin to HTN clinic. 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   Patient BP was elevated at 6/21 OV with Dr.Schumann. patient reported he had missed his Entresto dose that morning. Was advised to keep home BP record and follow up with PharmD in 2-4 weeks    Current HTN/HF meds: Farxiga 10 mg daily, Entresto 49-51 mg daily, Coreg 6.25 mg twice daily  Previously tried:  BP goal: <130/80  Family History:   Social History:   Diet:   Exercise:  {types:28256}  Home BP readings:  Date SBP/DBP  HR              Average      Wt Readings from Last 3 Encounters:  06/30/22 162 lb 3.2 oz (73.6 kg)  06/24/22 162 lb 9.6 oz (73.8 kg)  12/15/21 153 lb 6.4 oz (69.6 kg)   BP Readings from Last 3 Encounters:  06/30/22 138/86  06/24/22 (!) 150/80  12/15/21 (!) 161/82   Pulse Readings from Last 3 Encounters:  06/30/22 84  06/24/22 75  12/15/21 66    Renal function: CrCl cannot be calculated (Patient's most recent lab result is older than the maximum 21 days allowed.).  Past Medical History:  Diagnosis Date   Aortic atherosclerosis (HCC) 10/08/2018   Arthritis    Chronic kidney disease, stage 3a (HCC)    Chronic respiratory failure (HCC)    Diabetes mellitus without complication (HCC)    GERD (gastroesophageal reflux disease)    Hepatic steatosis 10/08/2018   Hyperlipidemia    Hypertension    Hyperthyroidism    Mass of right ovary 10/08/2018   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/w benign thyroid tissue   Mild CAD    Osteoporosis    Pneumonia    Renal infarct  (HCC)    SBO (small bowel obstruction) (HCC) 08/09/2018   Severe pulmonary hypertension (HCC)    Stroke (cerebrum) (HCC)     Current Outpatient Medications on File Prior to Visit  Medication Sig Dispense Refill   acetaminophen (TYLENOL) 500 MG tablet Take 1,000 mg by mouth every 6 (six) hours as needed for fever or headache (pain).     albuterol (PROVENTIL) (2.5 MG/3ML) 0.083% nebulizer solution Take 2.5 mg by nebulization every 6 (six) hours as needed for wheezing or shortness of breath.     albuterol (VENTOLIN HFA) 108 (90 Base) MCG/ACT inhaler Inhale 2 puffs into the lungs every 6 (six) hours as needed for wheezing or shortness of breath. 18 g 0   alendronate (FOSAMAX) 70 MG tablet Take 70 mg by mouth once a week.     atorvastatin (LIPITOR) 80 MG tablet Take 1 tablet (80 mg total) by mouth every evening. 30 tablet 1   calcium carbonate (OSCAL) 1500 (600 Ca) MG TABS tablet Take 600 mg of elemental calcium by mouth 2 (two) times daily with a meal.     carvedilol (COREG) 6.25 MG tablet Take 1 tablet (6.25 mg total) by  mouth 2 (two) times daily with a meal. NEEDS FOLLOW UP FOR MORE REFILLS 180 tablet 0   cetirizine (ZYRTEC) 10 MG tablet Take 10 mg by mouth daily.     clopidogrel (PLAVIX) 75 MG tablet Take 1 tablet (75 mg total) by mouth daily. 30 tablet 2   diclofenac sodium (VOLTAREN) 1 % GEL Apply 4 g topically 4 (four) times daily as needed for pain. shoulder     FARXIGA 10 MG TABS tablet Take 10 mg by mouth daily.     fluticasone (FLONASE) 50 MCG/ACT nasal spray Place 1 spray into both nostrils daily. 1 mL 2   gabapentin (NEURONTIN) 100 MG capsule Take 100 mg by mouth at bedtime.     guaiFENesin (MUCINEX) 600 MG 12 hr tablet Take 600 mg by mouth 2 (two) times daily as needed.     LUMIGAN 0.01 % SOLN Place 1 drop into the right eye at bedtime.     melatonin 5 MG TABS Take 10 mg by mouth at bedtime.     methimazole (TAPAZOLE) 5 MG tablet Take 0.5 tablets (2.5 mg total) by mouth daily. 30  tablet 0   Multiple Vitamin (MULTIVITAMIN WITH MINERALS) TABS tablet Take 1 tablet by mouth every morning.     Omega-3 Fatty Acids (FISH OIL PO) Take 1 capsule by mouth every morning.     OXYGEN Inhale 4 L into the lungs continuous.     pantoprazole (PROTONIX) 40 MG tablet Take 1 tablet (40 mg total) by mouth daily at 6 (six) AM. 30 tablet 0   potassium chloride SA (KLOR-CON M) 20 MEQ tablet Take 2 tablets by mouth once daily 180 tablet 3   sacubitril-valsartan (ENTRESTO) 49-51 MG Take 1 tablet by mouth 2 (two) times daily. 180 tablet 3   sildenafil (REVATIO) 20 MG tablet Take 1 tablet (20 mg total) by mouth 3 (three) times daily. 90 tablet 11   Torsemide 40 MG TABS Take 40 mg by mouth 2 (two) times daily. 60 tablet 1   vitamin B-12 (CYANOCOBALAMIN) 1000 MCG tablet Take 1,000 mcg by mouth daily.     vitamin C (ASCORBIC ACID) 500 MG tablet Take 500 mg by mouth daily.     Vitamin D, Cholecalciferol, 25 MCG (1000 UT) TABS Take 1,000 Units by mouth daily.     zinc gluconate 50 MG tablet Take 50 mg by mouth daily.     No current facility-administered medications on file prior to visit.    No Known Allergies  There were no vitals taken for this visit.   Assessment/Plan:  1. Hypertension -  No problem-specific Assessment & Plan notes found for this encounter.      Thank you  Carmela Hurt, Pharm.D [facility] A Division of  [facility] [address] [place], [place] [address]  Phone: [phone]; Fax: [phone]

## 2022-07-25 ENCOUNTER — Ambulatory Visit: Payer: Medicare HMO | Attending: Cardiology | Admitting: Student

## 2022-07-25 ENCOUNTER — Encounter: Payer: Self-pay | Admitting: Student

## 2022-07-25 VITALS — BP 176/83 | HR 78 | Ht 59.0 in | Wt 162.0 lb

## 2022-07-25 DIAGNOSIS — I10 Essential (primary) hypertension: Secondary | ICD-10-CM

## 2022-07-25 MED ORDER — CARVEDILOL 12.5 MG PO TABS: 12.5000 mg | ORAL_TABLET | Freq: Two times a day (BID) | ORAL | 3 refills | Status: DC

## 2022-07-25 NOTE — Patient Instructions (Signed)
Changes made by your pharmacist Carmela Hurt, PharmD at today's visit:    Instructions/Changes  (what do you need to do) Your Notes  (what you did and when you did it)  Increase carvedilol from 6.25 mg to 12.5 mg twice daily    Continue taking Farxiga 10 mg daily and Entresto 49-51 mg twice daily    Walk around the house 15 min twice a day     Bring all of your meds, your BP cuff and your record of home blood pressures to your next appointment.    HOW TO TAKE YOUR BLOOD PRESSURE AT HOME  Rest 5 minutes before taking your blood pressure.  Don't smoke or drink caffeinated beverages for at least 30 minutes before. Take your blood pressure before (not after) you eat. Sit comfortably with your back supported and both feet on the floor (don't cross your legs). Elevate your arm to heart level on a table or a desk. Use the proper sized cuff. It should fit smoothly and snugly around your bare upper arm. There should be enough room to slip a fingertip under the cuff. The bottom edge of the cuff should be 1 inch above the crease of the elbow. Ideally, take 3 measurements at one sitting and record the average.  Important lifestyle changes to control high blood pressure  Intervention  Effect on the BP  Lose extra pounds and watch your waistline Weight loss is one of the most effective lifestyle changes for controlling blood pressure. If you're overweight or obese, losing even a small amount of weight can help reduce blood pressure. Blood pressure might go down by about 1 millimeter of mercury (mm Hg) with each kilogram (about 2.2 pounds) of weight lost.  Exercise regularly As a general goal, aim for at least 30 minutes of moderate physical activity every day. Regular physical activity can lower high blood pressure by about 5 to 8 mm Hg.  Eat a healthy diet Eating a diet rich in whole grains, fruits, vegetables, and low-fat dairy products and low in saturated fat and cholesterol. A healthy diet  can lower high blood pressure by up to 11 mm Hg.  Reduce salt (sodium) in your diet Even a small reduction of sodium in the diet can improve heart health and reduce high blood pressure by about 5 to 6 mm Hg.  Limit alcohol One drink equals 12 ounces of beer, 5 ounces of wine, or 1.5 ounces of 80-proof liquor.  Limiting alcohol to less than one drink a day for women or two drinks a day for men can help lower blood pressure by about 4 mm Hg.   If you have any questions or concerns please use My Chart to send questions or call the office at [phone]

## 2022-07-25 NOTE — Assessment & Plan Note (Addendum)
Assessment: BP is uncontrolled in office BP 168/77 mmHg 2nd measurement 176/83 heart rate 78 (goal <130/80) Home cuff validated today reads within 10 points, home BP ~ 146/81  Takes current heart medications and diuretics regularly and tolerates them well without any side effects Denies SOB, palpitation, chest pain, headaches,or swelling Follows low salt diet, does not do any exercise Currently on ARNI, beta-blocker and SGLT2i for HF    Plan:  Increase carvedilol dose from 6.25 mg twice daily to 12.5 mg twice daily  Continue taking Entresto 49-51 mg twice daily and torsemide 40 mg twice daily  Patient to keep record of BP readings with heart rate and report to [place] at the next visit Patient to see PharmD in 4 weeks for follow up  Follow up lab(s):none

## 2022-08-18 ENCOUNTER — Other Ambulatory Visit: Payer: Self-pay | Admitting: Internal Medicine

## 2022-08-19 ENCOUNTER — Ambulatory Visit: Payer: Medicare HMO | Attending: Internal Medicine

## 2022-08-19 LAB — COMPLETE METABOLIC PANEL WITH GFR
AG Ratio: 1.3 (calc) (ref 1.0–2.5)
ALT: 21 U/L (ref 6–29)
AST: 24 U/L (ref 10–35)
Albumin: 4.2 g/dL (ref 3.6–5.1)
Alkaline phosphatase (APISO): 68 U/L (ref 37–153)
BUN/Creatinine Ratio: 23 (calc) — ABNORMAL HIGH (ref 6–22)
BUN: 39 mg/dL — ABNORMAL HIGH (ref 7–25)
CO2: 26 mmol/L (ref 20–32)
Calcium: 9.4 mg/dL (ref 8.6–10.4)
Chloride: 103 mmol/L (ref 98–110)
Creat: 1.72 mg/dL — ABNORMAL HIGH (ref 0.60–0.95)
Globulin: 3.2 g/dL (calc) (ref 1.9–3.7)
Glucose, Bld: 119 mg/dL — ABNORMAL HIGH (ref 65–99)
Potassium: 4.3 mmol/L (ref 3.5–5.3)
Sodium: 140 mmol/L (ref 135–146)
Total Bilirubin: 0.3 mg/dL (ref 0.2–1.2)
Total Protein: 7.4 g/dL (ref 6.1–8.1)
eGFR: 30 mL/min/{1.73_m2} — ABNORMAL LOW (ref >=60)

## 2022-08-19 LAB — LIPID PANEL
Cholesterol: 182 mg/dL (ref <200)
HDL: 61 mg/dL (ref >=50)
LDL Cholesterol (Calc): 103 mg/dL — ABNORMAL HIGH
Non-HDL Cholesterol (Calc): 121 mg/dL (ref <130)
Total CHOL/HDL Ratio: 3 (calc) (ref <5.0)
Triglycerides: 86 mg/dL (ref <150)

## 2022-08-19 LAB — CBC
HCT: 43.4 % (ref 35.0–45.0)
Hemoglobin: 14.1 g/dL (ref 11.7–15.5)
MCH: 27.5 pg (ref 27.0–33.0)
MCHC: 32.5 g/dL (ref 32.0–36.0)
MCV: 84.6 fL (ref 80.0–100.0)
MPV: 12.4 fL (ref 7.5–12.5)
Platelets: 237 10*3/uL (ref 140–400)
RBC: 5.13 10*6/uL — ABNORMAL HIGH (ref 3.80–5.10)
RDW: 13.9 % (ref 11.0–15.0)
WBC: 4.8 10*3/uL (ref 3.8–10.8)

## 2022-08-19 LAB — TSH: TSH: 2.59 m[IU]/L (ref 0.40–4.50)

## 2022-08-19 NOTE — Progress Notes (Deleted)
Patient ID: Mandy [name]                 DOB: 01-09-1940                      MRN: [id number]     HPI: Mandy [name] is a 82 y.o. female referred by Dr. Marland Kitchen to pharmacy clinic for HF medication management. PMH is significant for ***. Most recent LVEF *** on ***.  Discussion with patient today included the following: cardiac medication indications, review of GDMT including reasoning behind medication titration, importance of medication adherence, and patient engagement. ***Denies dizziness, fatigue, LE edema, or SOB. Able to complete all ADLs. *** checks daily weights at home. *** adheres to a low-sodium diet.  Current CHF meds:  Previously tried:  BP goal:   Family History:   Social History:   Diet:   Exercise:   Home BP readings:   Wt Readings from Last 3 Encounters:  07/25/22 162 lb (73.5 kg)  06/30/22 162 lb 3.2 oz (73.6 kg)  06/24/22 162 lb 9.6 oz (73.8 kg)   BP Readings from Last 3 Encounters:  07/25/22 (!) 176/83  06/30/22 138/86  06/24/22 (!) 150/80   Pulse Readings from Last 3 Encounters:  07/25/22 78  06/30/22 84  06/24/22 75    Renal function: CrCl cannot be calculated (Patient's most recent lab result is older than the maximum 21 days allowed.).  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     albuterol (PROVENTIL) (2.5 MG/3ML) 0.083% nebulizer solution Take 2.5 mg by nebulization every 6 (six) hours as needed for wheezing or shortness of breath.     albuterol (VENTOLIN HFA) 108 (90 Base) MCG/ACT inhaler Inhale 2 puffs into the lungs every 6 (six) hours as needed for wheezing or shortness of breath. 18 g 0   alendronate (FOSAMAX) 70 MG tablet Take 70 mg by mouth once a week.     atorvastatin (LIPITOR) 80 MG tablet Take 1 tablet (80 mg total) by mouth every evening. 30 tablet 1   calcium carbonate (OSCAL) 1500 (600 Ca) MG TABS tablet Take 600 mg of elemental calcium by mouth 2 (two) times daily with a meal.     carvedilol (COREG) 12.5 MG tablet Take 1 tablet (12.5 mg total) by mouth 2 (two) times daily. 180 tablet 3   cetirizine (ZYRTEC) 10 MG tablet Take 10 mg by mouth daily.     clopidogrel (PLAVIX) 75 MG tablet Take 1 tablet (75 mg total) by mouth daily. 30 tablet 2   diclofenac sodium (VOLTAREN) 1 %  GEL Apply 4 g topically 4 (four) times daily as needed for pain. shoulder     FARXIGA 10 MG TABS tablet Take 10 mg by mouth daily.     fluticasone (FLONASE) 50 MCG/ACT nasal spray Place 1 spray into both nostrils daily. 1 mL 2   gabapentin (NEURONTIN) 100 MG capsule Take 100 mg by mouth at bedtime.     guaiFENesin (MUCINEX) 600 MG 12 hr tablet Take 600 mg by mouth 2 (two) times daily as needed.     LUMIGAN 0.01 % SOLN Place 1 drop into the right eye at bedtime.     melatonin 5 MG TABS Take 10 mg by mouth at bedtime.     methimazole (TAPAZOLE) 5 MG tablet Take 0.5 tablets (2.5 mg total) by mouth daily. 30 tablet 0   Multiple Vitamin (MULTIVITAMIN WITH MINERALS) TABS tablet Take 1 tablet by mouth every morning.     Omega-3 Fatty Acids (FISH OIL PO) Take 1 capsule by mouth every morning.     OXYGEN Inhale 4 L into the lungs continuous.     pantoprazole  (PROTONIX) 40 MG tablet Take 1 tablet (40 mg total) by mouth daily at 6 (six) AM. 30 tablet 0   potassium chloride SA (KLOR-CON M) 20 MEQ tablet Take 2 tablets by mouth once daily 180 tablet 3   sacubitril-valsartan (ENTRESTO) 49-51 MG Take 1 tablet by mouth 2 (two) times daily. 180 tablet 3   sildenafil (REVATIO) 20 MG tablet Take 1 tablet (20 mg total) by mouth 3 (three) times daily. 90 tablet 11   Torsemide 40 MG TABS Take 40 mg by mouth 2 (two) times daily. 60 tablet 1   vitamin B-12 (CYANOCOBALAMIN) 1000 MCG tablet Take 1,000 mcg by mouth daily.     vitamin C (ASCORBIC ACID) 500 MG tablet Take 500 mg by mouth daily.     Vitamin D, Cholecalciferol, 25 MCG (1000 UT) TABS Take 1,000 Units by mouth daily.     zinc gluconate 50 MG tablet Take 50 mg by mouth daily.     No current facility-administered medications on file prior to visit.    No Known Allergies   Assessment/Plan:  1. CHF -

## 2022-09-12 ENCOUNTER — Other Ambulatory Visit (HOSPITAL_COMMUNITY): Payer: Self-pay | Admitting: Physician Assistant

## 2022-09-13 NOTE — Telephone Encounter (Signed)
Patient needs appointment with Dr. Gala Romney. Tried calling patient. No answer, Left message to return call.

## 2023-02-15 ENCOUNTER — Ambulatory Visit (INDEPENDENT_AMBULATORY_CARE_PROVIDER_SITE_OTHER): Payer: Medicaid Other | Admitting: Pulmonary Disease

## 2023-02-15 ENCOUNTER — Encounter: Payer: Self-pay | Admitting: Pulmonary Disease

## 2023-02-15 DIAGNOSIS — I272 Pulmonary hypertension, unspecified: Secondary | ICD-10-CM

## 2023-02-15 LAB — BRAIN NATRIURETIC PEPTIDE: Pro B Natriuretic peptide (BNP): 94 pg/mL (ref 0.0–100.0)

## 2023-02-15 MED ORDER — SILDENAFIL CITRATE 20 MG PO TABS: 20.0000 mg | ORAL_TABLET | Freq: Three times a day (TID) | ORAL | 3 refills | Status: AC

## 2023-02-15 NOTE — Patient Instructions (Addendum)
Nice to see you again  Continue sildenafil 20 mg 3 times a day for pulmonary hypertension  Please use oxygen, we need to keep oxygen above 90%.  Will be wise to use this at all times when you are exerting yourself or moving around.  Please call the cardiology office, Dr. Bjorn Pippin.  The number is  [phone] -call and schedule a follow-up appointment.  Blood work today.  Return to clinic in 6 months or sooner as needed with Dr. Judeth Horn

## 2023-02-15 NOTE — Progress Notes (Signed)
 @Patient  ID: Mandy [name], female    DOB: [birth year], 83 y.o.   MRN: [id number]  Chief Complaint  Patient presents with   Follow-up    C/o burning in lungs    Referring provider: Fleet Contras, MD  HPI:   83 y.o. woman whom we are seeing in follow-up for pulmonary hypertension. Most recent pharmacy note from cardiology clinic reviewed.   Overall doing okay. Weight up 15 pounds.  However no edema on exam.  Seems like just gaining weight.  Less active, not moving as much.  Suspect contributing to weight gain.  Dyspnea doing fine.  No worsening.  Overall she thinks better.  Discussed cardiology follow-up.  Discussed importance of scheduling this.  Medicines currently being managed by PCP.  Sounds like blood pressure medicines recently increased.  Questionaires / Pulmonary Flowsheets:   ACT:      No data to display          MMRC:     No data to display          Epworth:     07/08/2021    9:52 AM  Results of the Epworth flowsheet  Sitting and reading 2  Watching TV 2  Sitting, inactive in a public place (e.g. a theatre or a meeting) 2  As a passenger in a car for an hour without a break 2  Lying down to rest in the afternoon when circumstances permit 2  Sitting and talking to someone 1  Sitting quietly after a lunch without alcohol 1  In a car, while stopped for a few minutes in traffic 1  Total score 13    Tests:   FENO:  No results found for: "NITRICOXIDE"  PFT:    Latest Ref Rng & Units 06/17/2021   10:03 AM  PFT Results  FVC-Pre L 0.78   FVC-Predicted Pre % 52   FVC-Post L 0.81   FVC-Predicted Post % 54   Pre FEV1/FVC % % 82   Post FEV1/FCV % % 87   FEV1-Pre L 0.65   FEV1-Predicted Pre % 57   FEV1-Post L 0.71   DLCO uncorrected ml/min/mmHg 6.85   DLCO UNC% % 43   DLVA Predicted % 52   TLC L 4.02   TLC % Predicted % 93   RV % Predicted % 149   Personally reviewed and interpreted spirometry just of moderate restriction versus air trapping.  No  bronchodilator spots.  TLC within normal segment elevated RV indicative of air trapping.  DLCO severely reduced.  WALK:     05/26/2021    2:47 PM  SIX MIN WALK  Supplimental Oxygen during Test? (L/min) Yes  O2 Flow Rate 2 L/min  Type Continuous  Tech Comments: patient started on 2 liters and needed 4 liters with exertion. She felt short of breath. she went about 1/2 lap.    Imaging: Personally reviewed and as per EMR discussion this note No results found.  Lab Results: Personally reviewed CBC    Component Value Date/Time   WBC 4.8 08/18/2022 1710   RBC 5.13 (H) 08/18/2022 1710   HGB 14.1 08/18/2022 1710   HGB 13.7 12/15/2021 1007   HCT 43.4 08/18/2022 1710   HCT 41.8 12/15/2021 1007   PLT 237 08/18/2022 1710   PLT 257 12/15/2021 1007   MCV 84.6 08/18/2022 1710   MCV 85 12/15/2021 1007   MCH 27.5 08/18/2022 1710   MCHC 32.5 08/18/2022 1710   RDW 13.9 08/18/2022 1710   RDW 15.1  12/15/2021 1007   LYMPHSABS 1.2 02/18/2021 0253   MONOABS 0.8 02/18/2021 0253   EOSABS 0.1 02/18/2021 0253   BASOSABS 0.0 02/18/2021 0253    BMET    Component Value Date/Time   NA 140 08/18/2022 1710   NA 141 06/24/2022 1028   K 4.3 08/18/2022 1710   CL 103 08/18/2022 1710   CO2 26 08/18/2022 1710   GLUCOSE 119 (H) 08/18/2022 1710   BUN 39 (H) 08/18/2022 1710   BUN 39 (H) 06/24/2022 1028   CREATININE 1.72 (H) 08/18/2022 1710   CALCIUM 9.4 08/18/2022 1710   GFRNONAA 40 (L) 08/04/2021 0911   GFRNONAA 43 (L) 07/07/2020 0000   GFRAA 50 (L) 07/07/2020 0000    BNP    Component Value Date/Time   BNP 84.7 07/22/2021 1607    ProBNP    Component Value Date/Time   PROBNP 75.0 10/06/2021 1040    Specialty Problems       Pulmonary Problems   SORE THROAT   Qualifier: Diagnosis of  By: Philipp Deputy MD, Kelly        Acute respiratory failure with hypoxia (HCC)   Recurrent right pleural effusion   Hypoxia   Chronic respiratory failure with hypoxia, on home oxygen therapy (HCC)    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HCC)   DOE (dyspnea on exertion)   Tracheobronchomalacia    No Known Allergies  Immunization History  Administered Date(s) Administered   Influenza-Unspecified 11/03/2017    Past Medical History:  Diagnosis Date   Aortic atherosclerosis (HCC) 10/08/2018   Arthritis    Chronic kidney disease, stage 3a (HCC)    Chronic respiratory failure (HCC)    Diabetes mellitus without complication (HCC)    GERD (gastroesophageal reflux disease)    Hepatic steatosis 10/08/2018   Hyperlipidemia    Hypertension    Hyperthyroidism    Mass of right ovary 10/08/2018   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/w benign thyroid tissue   Mild CAD    Osteoporosis    Pneumonia    Renal infarct (HCC)    SBO (small bowel obstruction) (HCC) 08/09/2018   Severe pulmonary hypertension (HCC)    Stroke (cerebrum) (HCC)     Tobacco History: Social History   Tobacco Use  Smoking Status Never  Smokeless Tobacco Never   Counseling given: Not Answered   Continue to not smoke  Outpatient Encounter Medications as of 02/15/2023  Medication Sig   acetaminophen (TYLENOL) 500 MG tablet Take 1,000 mg by mouth every 6 (six) hours as needed for fever or headache (pain).   albuterol (PROVENTIL) (2.5 MG/3ML) 0.083% nebulizer solution Take 2.5 mg by nebulization every 6 (six) hours as needed for wheezing or shortness of breath.   albuterol (VENTOLIN HFA) 108 (90 Base) MCG/ACT inhaler Inhale 2 puffs into the lungs every 6 (six) hours as needed for wheezing or shortness of breath.   alendronate (FOSAMAX) 70 MG tablet Take 70 mg by mouth once a week.   atorvastatin (LIPITOR) 80 MG tablet Take 1 tablet (80 mg total) by mouth every evening.   calcium carbonate (OSCAL) 1500 (600 Ca) MG TABS tablet Take 600 mg of elemental calcium by mouth 2 (two) times daily with a meal.   cetirizine (ZYRTEC) 10 MG tablet Take 10 mg by mouth daily.   clopidogrel (PLAVIX) 75 MG tablet Take 1 tablet (75  mg total) by mouth daily.   diclofenac sodium (VOLTAREN) 1 % GEL Apply 4 g topically 4 (four) times daily as needed for pain.  shoulder   FARXIGA 10 MG TABS tablet Take 10 mg by mouth daily.   fluticasone (FLONASE) 50 MCG/ACT nasal spray Place 1 spray into both nostrils daily.   gabapentin (NEURONTIN) 100 MG capsule Take 100 mg by mouth at bedtime.   guaiFENesin (MUCINEX) 600 MG 12 hr tablet Take 600 mg by mouth 2 (two) times daily as needed.   LUMIGAN 0.01 % SOLN Place 1 drop into the right eye at bedtime.   melatonin 5 MG TABS Take 10 mg by mouth at bedtime.   methimazole (TAPAZOLE) 5 MG tablet Take 0.5 tablets (2.5 mg total) by mouth daily.   Multiple Vitamin (MULTIVITAMIN WITH MINERALS) TABS tablet Take 1 tablet by mouth every morning.   Omega-3 Fatty Acids (FISH OIL PO) Take 1 capsule by mouth every morning.   OXYGEN Inhale 4 L into the lungs continuous.   pantoprazole (PROTONIX) 40 MG tablet Take 1 tablet (40 mg total) by mouth daily at 6 (six) AM.   potassium chloride (KLOR-CON) 10 MEQ tablet Take 10 mEq by mouth daily.   sacubitril-valsartan (ENTRESTO) 49-51 MG Take 1 tablet by mouth 2 (two) times daily.   Torsemide 40 MG TABS Take 40 mg by mouth 2 (two) times daily.   vitamin B-12 (CYANOCOBALAMIN) 1000 MCG tablet Take 1,000 mcg by mouth daily.   vitamin C (ASCORBIC ACID) 500 MG tablet Take 500 mg by mouth daily.   Vitamin D, Cholecalciferol, 25 MCG (1000 UT) TABS Take 1,000 Units by mouth daily.   zinc gluconate 50 MG tablet Take 50 mg by mouth daily.   [DISCONTINUED] sildenafil (REVATIO) 20 MG tablet Take 1 tablet (20 mg total) by mouth 3 (three) times daily.   carvedilol (COREG) 12.5 MG tablet Take 1 tablet (12.5 mg total) by mouth 2 (two) times daily.   sildenafil (REVATIO) 20 MG tablet Take 1 tablet (20 mg total) by mouth 3 (three) times daily.   [DISCONTINUED] potassium chloride SA (KLOR-CON M) 20 MEQ tablet Take 2 tabs by mouth once daily   No facility-administered encounter  medications on file as of 02/15/2023.     Review of Systems  Review of Systems  N/a Physical Exam  BP 128/86 (BP Location: Right Arm, Patient Position: Sitting, Cuff Size: Large)   Pulse 87   Temp 97.9 F (36.6 C) (Oral)   Ht 4\' 11"  (1.499 m)   Wt 177 lb (80.3 kg)   SpO2 95% Comment: room air  BMI 35.75 kg/m   Wt Readings from Last 5 Encounters:  02/15/23 177 lb (80.3 kg)  07/25/22 162 lb (73.5 kg)  06/30/22 162 lb 3.2 oz (73.6 kg)  06/24/22 162 lb 9.6 oz (73.8 kg)  12/15/21 153 lb 6.4 oz (69.6 kg)    BMI Readings from Last 5 Encounters:  02/15/23 35.75 kg/m  07/25/22 32.72 kg/m  06/30/22 32.76 kg/m  06/24/22 32.84 kg/m  12/15/21 30.98 kg/m     Physical Exam General: Sitting in chair, no effusions Eyes: EOMI, icterus Neck: Supple, no JVP appreciated Pulm: Clear, distant, normal work of breathing Cardiovascular: Regular rhythm, no murmur, no lower extremity edema Abdomen: Nondistended, sounds present MSK: No synovitis, joint effusion Neuro: Ambulates with walker, no weakness Psych: Normal mood, full affect   Assessment & Plan:   Pulmonary hypertension: Proven on right heart cath.  PVR elevated to around 5, suspect Group 1 largest contribution.  Also contribution of group 2 disease given left-sided volume overload on multiple images.  LVEDP at time of cath was 9 which  is reassuring.  With good diuresis sounds like we can achieve relatively euvolemic status from the left side of the heart.  VQ scan negative.  Hypoxemia initially contributing, group 3 disease possible, however no parenchymal abnormalities, hypoxemia could be related to pulmonary hypertension alone in the setting of VQ mismatch.   Started sildenafil in the hospital early 2023.  Does think is helped some.  No flash pulmonary edema, adverse effects at all.  Unfortunately, developed volume overload with ERA, Opsumit.  Tadalafil was also on board at this point in time.  Subsequent switch back to  sildenafil monotherapy.  Improved symptoms.  Suspect stiff left ventricle cannot handle additional pulmonary vasodilators.  No plan to add second agent at this time.  Sildenafil 20 mg 3 times daily refilled today.  Repeat BNP today, most recent check was within normal limits which is reassuring.  She appears euvolemic on exam.  Chronic hypoxemic respiratory failure: Not using reliably.  Ongoing issues with nonadherence.  Expressed how important it is to continue oxygen therapy for pulmonary hypertension.    Return in about 6 months (around 08/15/2023) for f/u Dr. Judeth Horn.   Karren Burly, MD 02/15/2023

## 2023-02-15 NOTE — Addendum Note (Signed)
Addended byClyda Greener M on: 02/15/2023 10:16 AM   Modules accepted: Orders

## 2023-05-10 IMAGING — MR MR HEAD W/O CM
8 of 10 series · 33 of 48 positions shown · non-contrast
Comparison: CT head 12/06/2020, correlation is also made with MRI
head 06/02/2005

CLINICAL DATA: Stroke, follow-up

EXAM:
MRI HEAD WITHOUT CONTRAST
MRA HEAD WITHOUT CONTRAST
TECHNIQUE: Multiplanar, multi-echo pulse sequences of the brain and surrounding
structures were acquired without intravenous contrast. Angiographic
images of the Circle of Willis were acquired using MRA technique
without intravenous contrast.

[Series 3: DWI · axial · 3.0mm · 1.09mm/px · z∈[-65,+68]mm · 8 of 92 slices shown (1 of 4)]
[im 1/92]
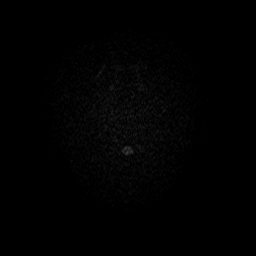
[im 11/92]
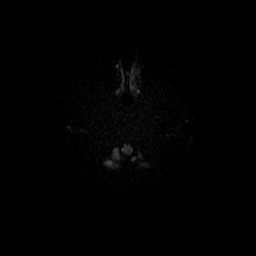
[im 31/92]
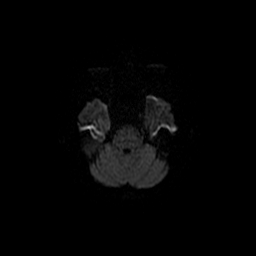
[im 41/92]
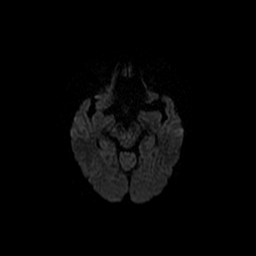
[im 51/92]
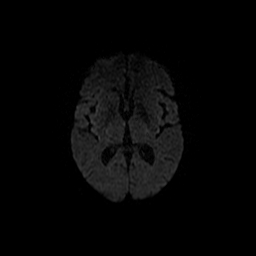
[im 61/92]
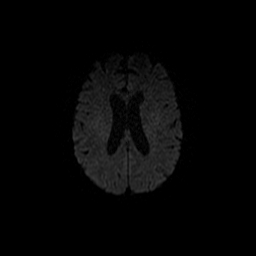
[im 81/92]
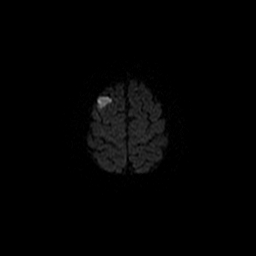
[im 92/92]
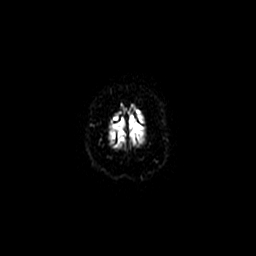

[Series 5: DWI · coronal · 5.0mm · 1.09mm/px · 7 of 64 slices shown (2 of 4)]
[im 1/64]
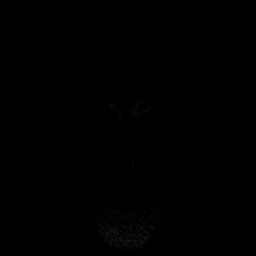
[im 11/64]
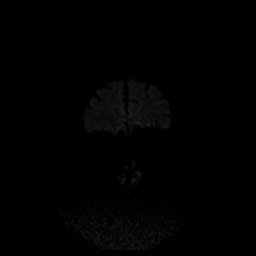
[im 22/64]
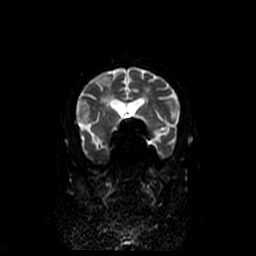
[im 32/64]
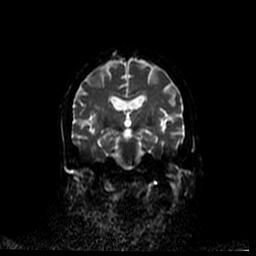
[im 43/64]
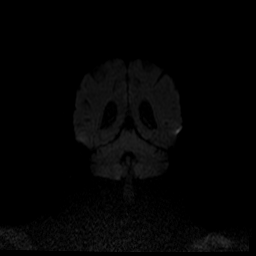
[im 53/64]
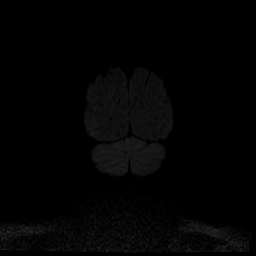
[im 64/64]
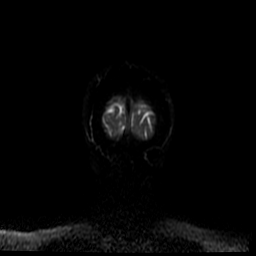

[Series 6: T1 · sagittal · 5.0mm · 0.47mm/px · 1 of 22 slices shown]
[im 1/22]
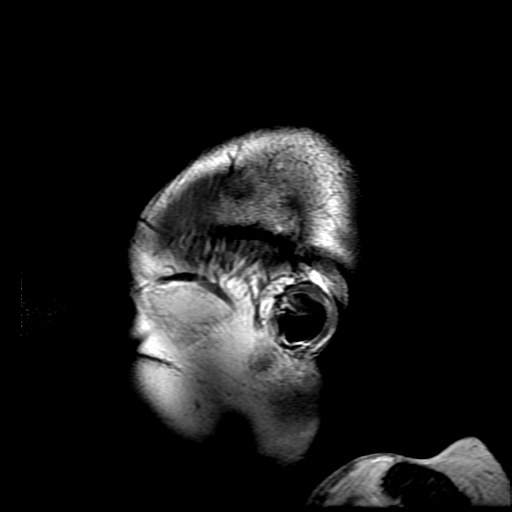

[Series 7: T2 · axial · 5.0mm · 0.43mm/px · z∈[-53,+83]mm · 3 of 24 slices shown]
[im 1/24]
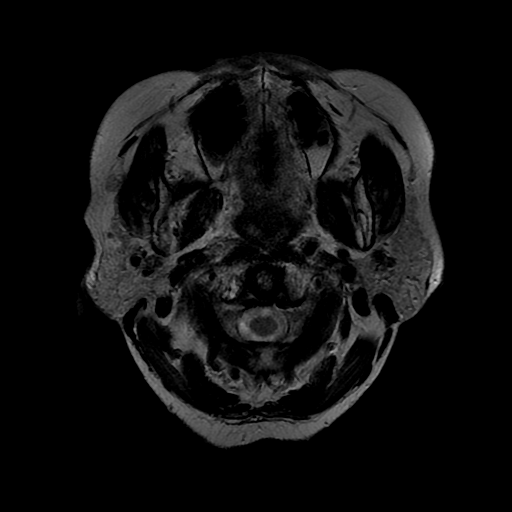
[im 12/24]
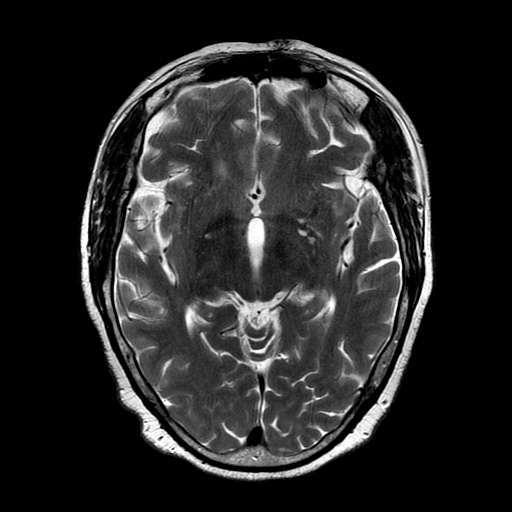
[im 24/24]
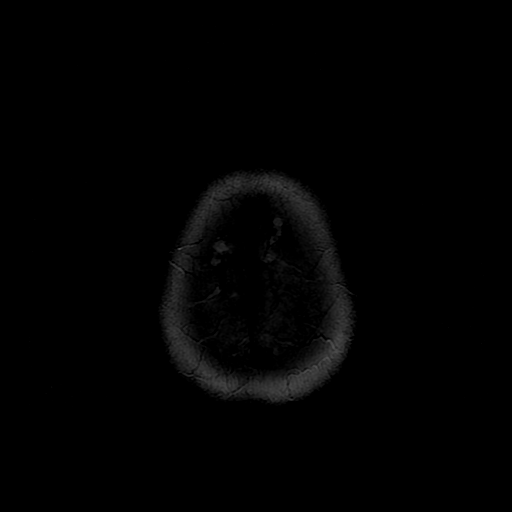

[Series 8: FLAIR · axial · 3.0mm · 0.43mm/px · z∈[-53,+83]mm · 3 of 24 slices shown]
[im 1/24]
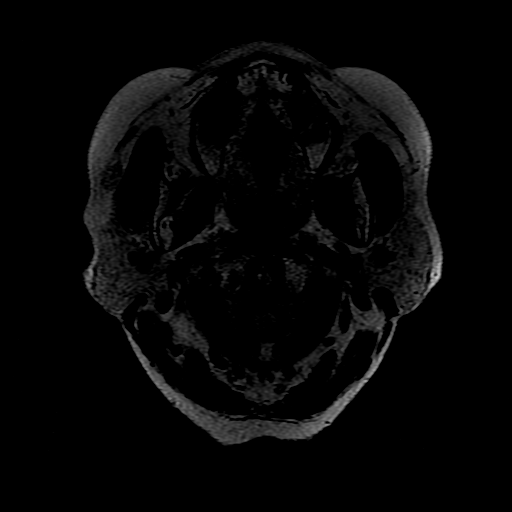
[im 12/24]
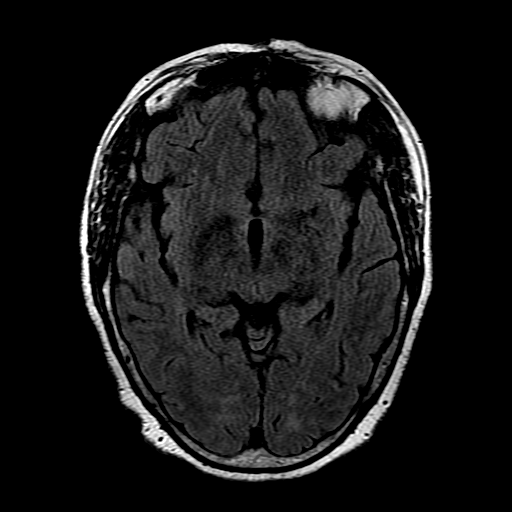
[im 24/24]
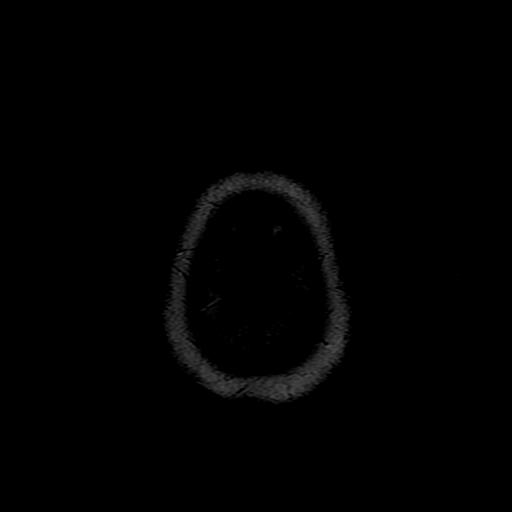

[Series 11: T2 post-contrast · coronal · 5.0mm · 0.39mm/px · 3 of 26 slices shown]
[im 1/26]
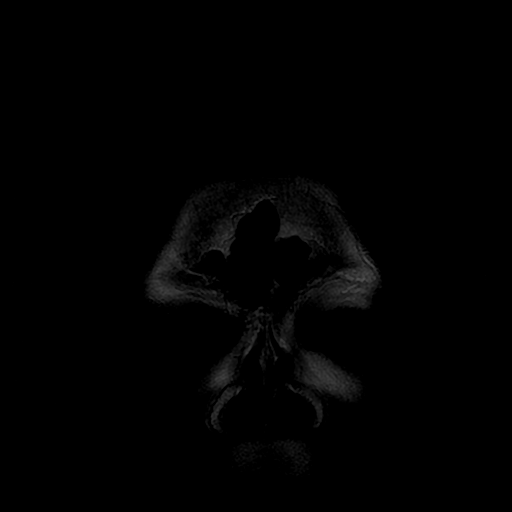
[im 13/26]
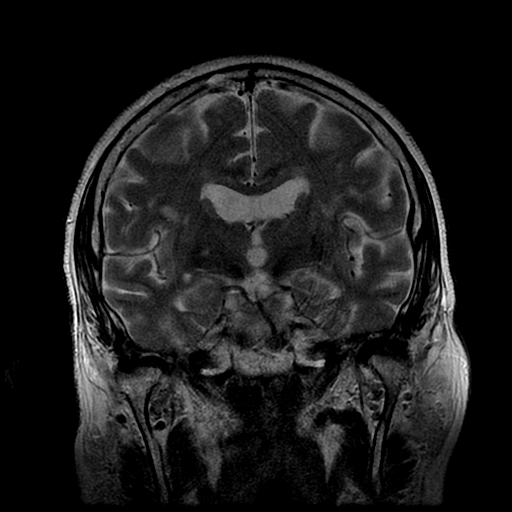
[im 26/26]
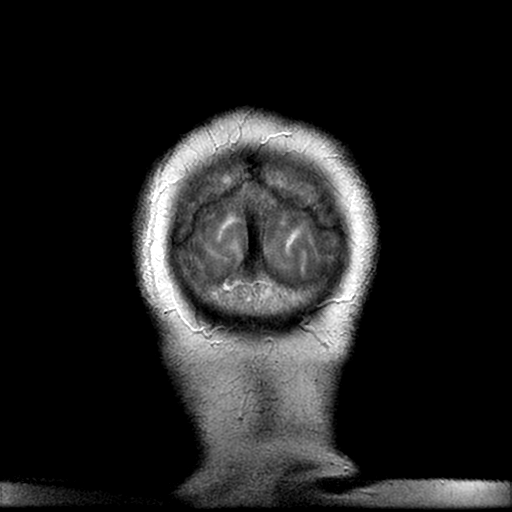

[Series 300: DWI · axial · 3.0mm · 1.09mm/px · z∈[-65,+68]mm · 5 of 46 slices shown (3 of 4)]
[im 1/46]
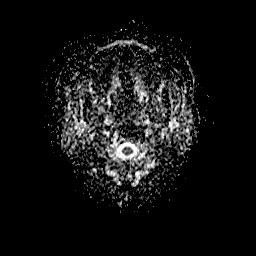
[im 12/46]
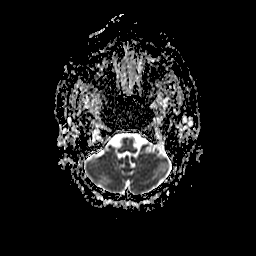
[im 23/46]
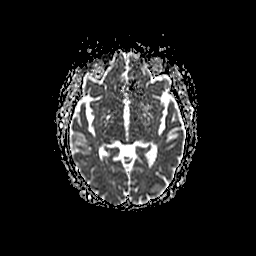
[im 34/46]
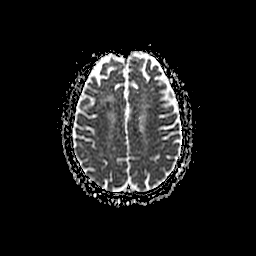
[im 46/46]
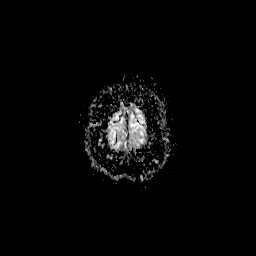

[Series 500: DWI · coronal · 5.0mm · 1.09mm/px · 3 of 31 slices shown (4 of 4)]
[im 1/31]
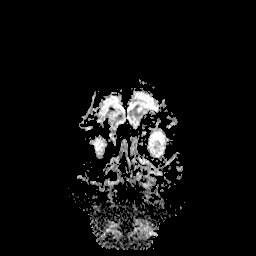
[im 16/31]
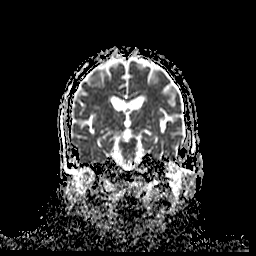
[im 31/31]
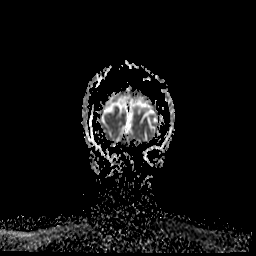

[33 of 48 positions shown; findings below may reference images not displayed]

FINDINGS: MRI HEAD FINDINGS

Brain: Wedge-shaped area of restricted diffusion with ADC correlate
in the right frontal lobe in the right MCA territory (series 5,
images 19-24), consistent with the findings seen on the 12/06/2020
CT head. This area is associated with increased T2 signal. No acute
hemorrhage, mass, mass effect, or midline shift. No hydrocephalus or
extra-axial collection. T2 hyperintense signal in the
periventricular white matter and pons, likely the sequela of
moderate chronic small vessel ischemic disease.

Vascular: Please see MRA findings below.

Skull and upper cervical spine: Normal marrow signal. Degenerative
changes in the cervical spine, with reversal of the normal cervical
lordosis.

Sinuses/Orbits: No acute or significant finding. Status post right
lens replacement.

Other: Trace fluid in right mastoid air cells.

MRA HEAD FINDINGS

Anterior circulation: Both internal carotid arteries are patent to
the termini, with mild irregularity in right greater than left
cavernous ICA and moderate narrowing of the right greater than left
supraclinoid ICA, likely secondary to atherosclerotic plaque. A1
segments patent, although there may be focal narrowing at the origin
of the right greater than A1. Normal anterior communicating artery.
Anterior cerebral arteries are patent to their distal aspects.

No M1 stenosis or occlusion. Normal MCA bifurcations. Narrowing of a
proximal right M2 branch (series 4, image 79). Distal MCA branches
perfused and symmetric. There is possible focal stenosis proximally
of a small right MCA branch (series 4, image 84), which appears to
supply the right frontal lobe area described above as having
restricted diffusion.

Posterior circulation: Vertebral arteries patent to the
vertebrobasilar junction without stenosis. Posterior inferior
cerebral arteries visualized on the left. Basilar patent to its
distal aspect. Superior cerebellar arteries patent bilaterally.
Diminutive right P1, with near fetal origin of the right PCA. PCAs
perfused to their distal aspects without stenosis. The bilateral
posterior communicating arteries are visualized.

Anatomic variants: None significant
IMPRESSION: 1. Restricted diffusion in the right MCA territory, with associated
increased T2 signal, which correlates with the findings seen on the
12/06/2020 CT head and is consistent with subacute infarct.
2. No intracranial large vessel occlusion. Multifocal narrowing in
the intracranial ICAs, likely secondary to atherosclerotic plaque.
In addition, there is possible focal stenosis proximally of a right
MCA branch, which appears to supply the right anterior frontal lobe
in the area of infarction.

## 2023-05-10 IMAGING — MR MR MRA HEAD W/O CM
3 series · 18 of 48 positions shown · non-contrast
Comparison: CT head 12/06/2020, correlation is also made with MRI
head 06/02/2005

CLINICAL DATA: Stroke, follow-up

EXAM:
MRI HEAD WITHOUT CONTRAST
MRA HEAD WITHOUT CONTRAST
TECHNIQUE: Multiplanar, multi-echo pulse sequences of the brain and surrounding
structures were acquired without intravenous contrast. Angiographic
images of the Circle of Willis were acquired using MRA technique
without intravenous contrast.

[Series 4: (id) mt fs · axial · 1.4mm · 0.43mm/px · z∈[-59,+30]mm · 16 of 136 slices shown]
[im 1/136]
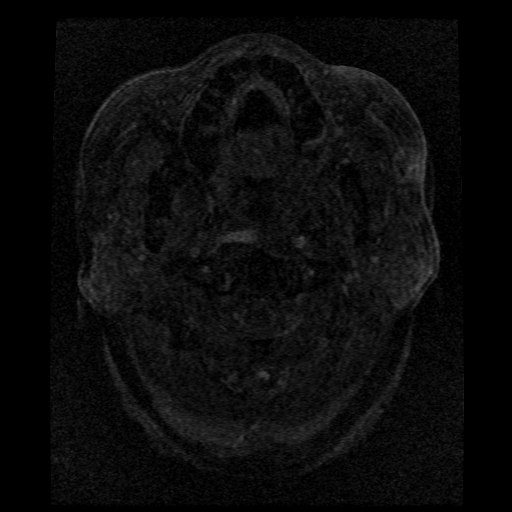
[im 4/136]
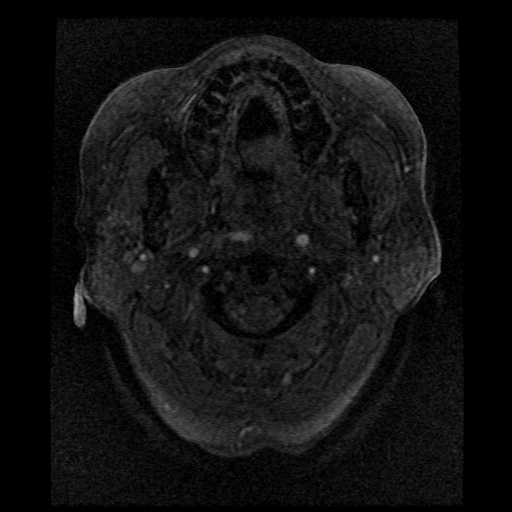
[im 7/136]
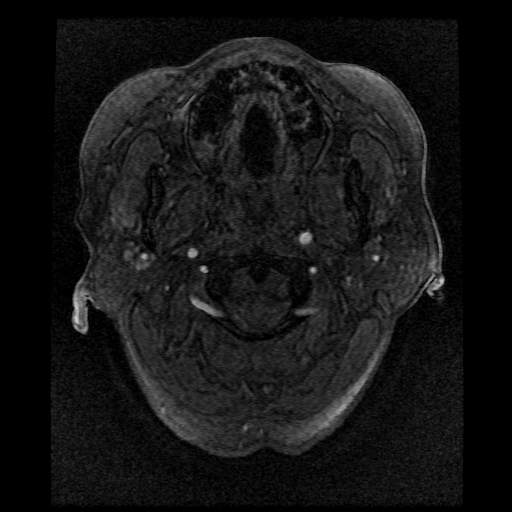
[im 10/136]
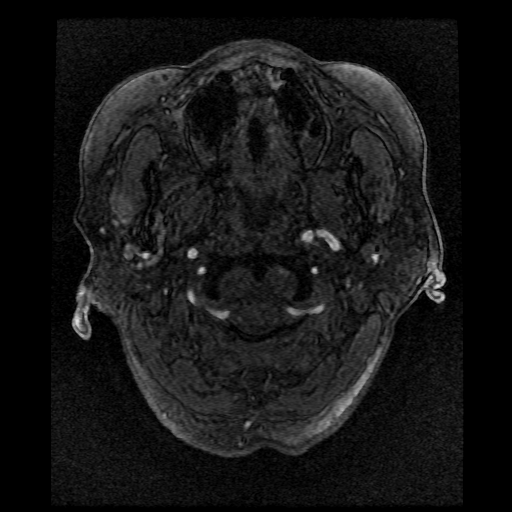
[im 13/136]
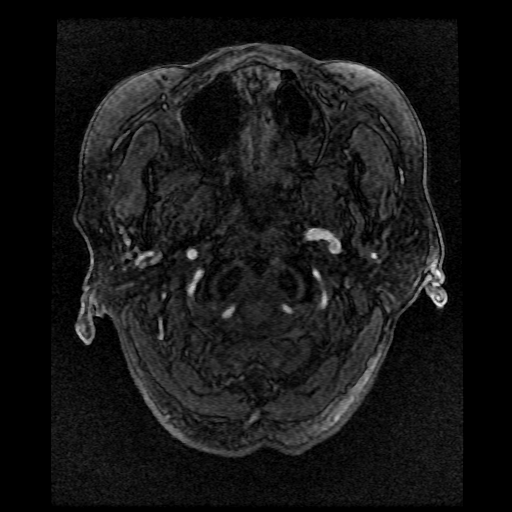
[im 16/136]
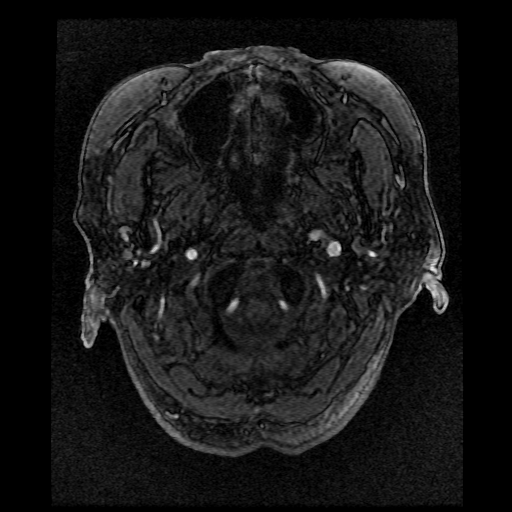
[im 22/136]
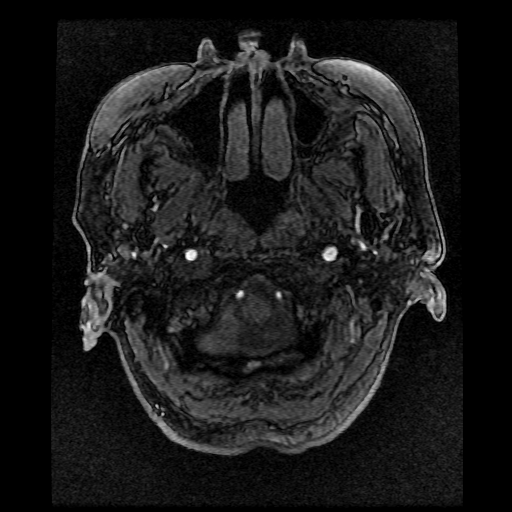
[im 25/136]
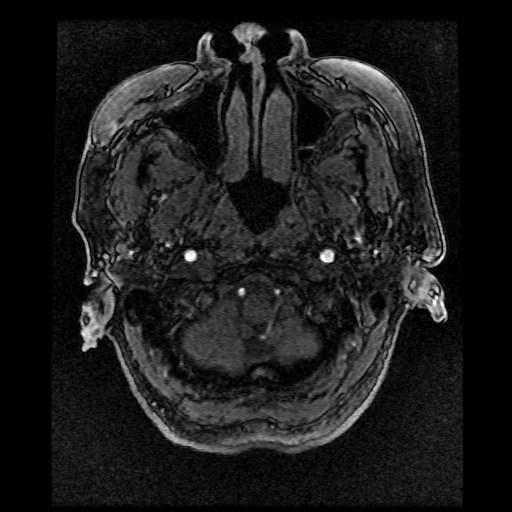
[im 43/136]
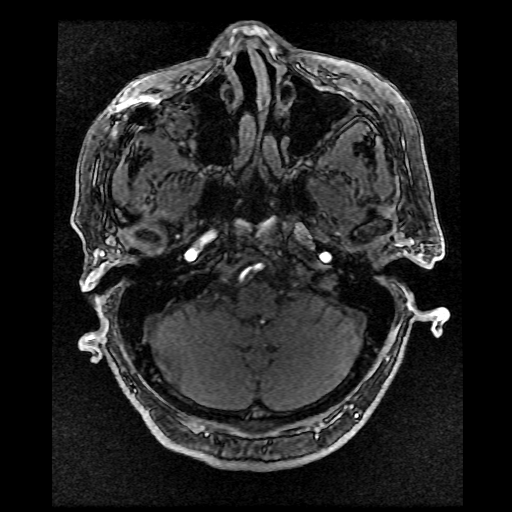
[im 61/136]
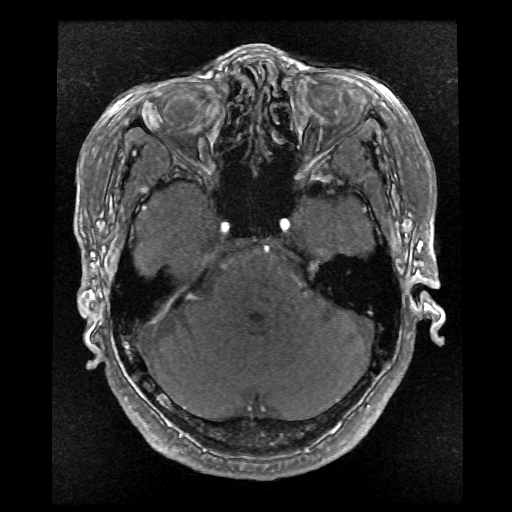
[im 70/136]
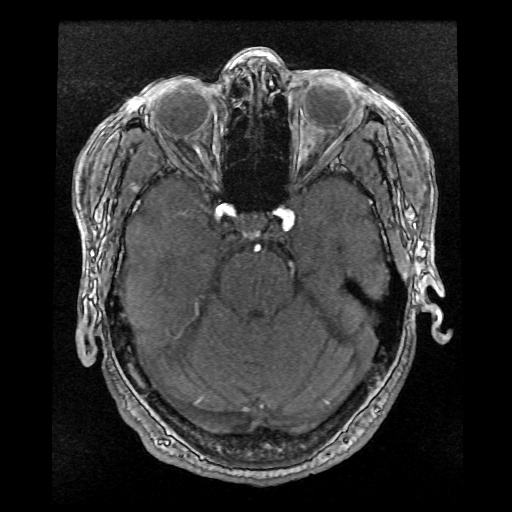
[im 76/136]
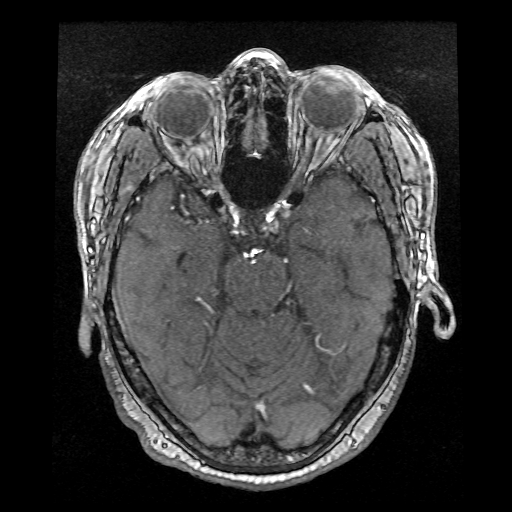
[im 94/136]
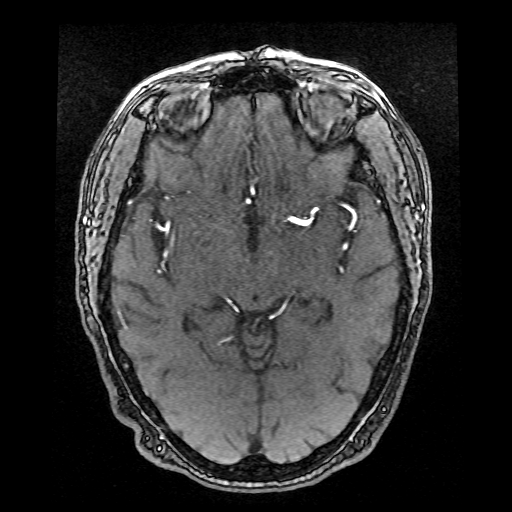
[im 112/136]
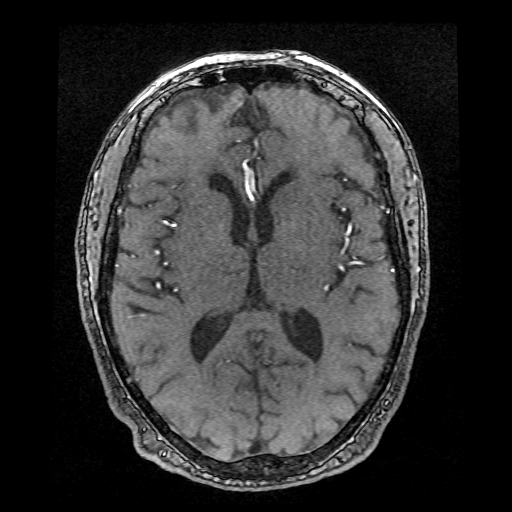
[im 115/136]
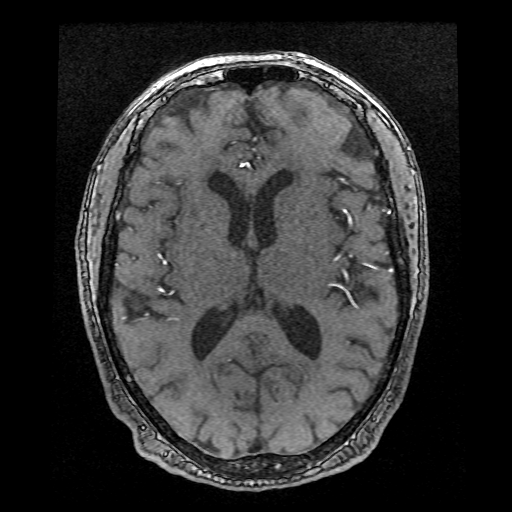
[im 130/136]
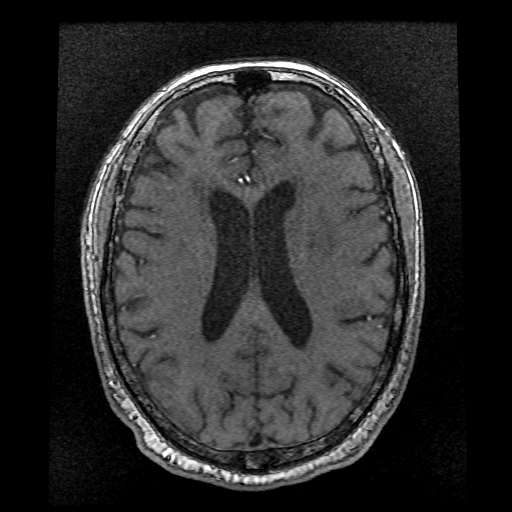

[Series 400: col:(id) mt fs · axial · 1.4mm · 0.43mm/px · 1 of 1 slices shown]
[im 1/1]
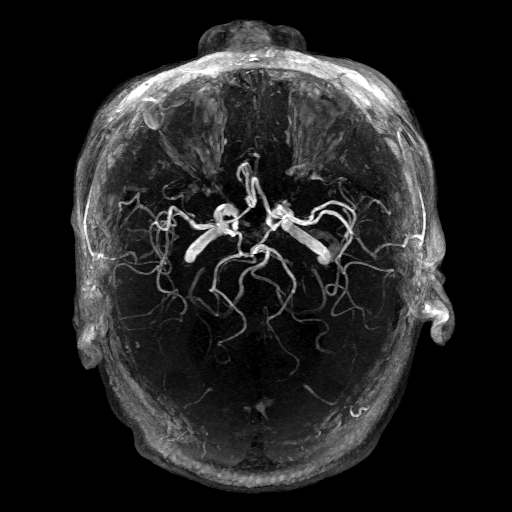

[Series 406: cow tumble · axial · 1.4mm · 0.27mm/px · 1 of 1 slices shown]
[im 1/1]
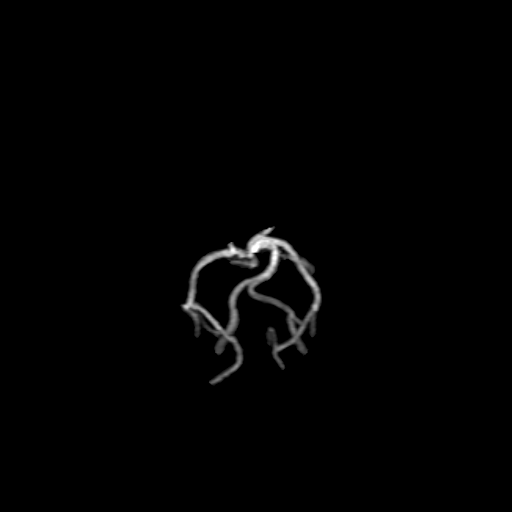

[18 of 48 positions shown; findings below may reference images not displayed]

FINDINGS: MRI HEAD FINDINGS

Brain: Wedge-shaped area of restricted diffusion with ADC correlate
in the right frontal lobe in the right MCA territory (series 5,
images 19-24), consistent with the findings seen on the 12/06/2020
CT head. This area is associated with increased T2 signal. No acute
hemorrhage, mass, mass effect, or midline shift. No hydrocephalus or
extra-axial collection. T2 hyperintense signal in the
periventricular white matter and pons, likely the sequela of
moderate chronic small vessel ischemic disease.

Vascular: Please see MRA findings below.

Skull and upper cervical spine: Normal marrow signal. Degenerative
changes in the cervical spine, with reversal of the normal cervical
lordosis.

Sinuses/Orbits: No acute or significant finding. Status post right
lens replacement.

Other: Trace fluid in right mastoid air cells.

MRA HEAD FINDINGS

Anterior circulation: Both internal carotid arteries are patent to
the termini, with mild irregularity in right greater than left
cavernous ICA and moderate narrowing of the right greater than left
supraclinoid ICA, likely secondary to atherosclerotic plaque. A1
segments patent, although there may be focal narrowing at the origin
of the right greater than A1. Normal anterior communicating artery.
Anterior cerebral arteries are patent to their distal aspects.

No M1 stenosis or occlusion. Normal MCA bifurcations. Narrowing of a
proximal right M2 branch (series 4, image 79). Distal MCA branches
perfused and symmetric. There is possible focal stenosis proximally
of a small right MCA branch (series 4, image 84), which appears to
supply the right frontal lobe area described above as having
restricted diffusion.

Posterior circulation: Vertebral arteries patent to the
vertebrobasilar junction without stenosis. Posterior inferior
cerebral arteries visualized on the left. Basilar patent to its
distal aspect. Superior cerebellar arteries patent bilaterally.
Diminutive right P1, with near fetal origin of the right PCA. PCAs
perfused to their distal aspects without stenosis. The bilateral
posterior communicating arteries are visualized.

Anatomic variants: None significant
IMPRESSION: 1. Restricted diffusion in the right MCA territory, with associated
increased T2 signal, which correlates with the findings seen on the
12/06/2020 CT head and is consistent with subacute infarct.
2. No intracranial large vessel occlusion. Multifocal narrowing in
the intracranial ICAs, likely secondary to atherosclerotic plaque.
In addition, there is possible focal stenosis proximally of a right
MCA branch, which appears to supply the right anterior frontal lobe
in the area of infarction.

## 2023-05-11 ENCOUNTER — Ambulatory Visit: Payer: Medicare HMO | Attending: Cardiology | Admitting: Cardiology

## 2023-05-11 VITALS — BP 138/72 | Wt 172.2 lb

## 2023-05-11 DIAGNOSIS — N1831 Chronic kidney disease, stage 3a: Secondary | ICD-10-CM | POA: Diagnosis not present

## 2023-05-11 DIAGNOSIS — I272 Pulmonary hypertension, unspecified: Secondary | ICD-10-CM

## 2023-05-11 DIAGNOSIS — I5032 Chronic diastolic (congestive) heart failure: Secondary | ICD-10-CM

## 2023-05-11 DIAGNOSIS — I10 Essential (primary) hypertension: Secondary | ICD-10-CM

## 2023-05-11 NOTE — Progress Notes (Signed)
 Cardiology Office Note:    Date:  05/11/2023   ID:  Mandy [name], DOB 06/15/1940, MRN [id number]  PCP:  Charle Congo, MD  Cardiologist:  Wendie Hamburg, MD  Electrophysiologist:  None   Referring MD: Charle Congo, MD   Chief Complaint  Patient presents with   Congestive Heart Failure    History of Present Illness:    Mandy [name] is a 83 y.o. 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  She has had multiple recent admissions with acute on chronic respiratory failure.  Most recently was admitted from 1/31 through 02/22/2021.  She was admitted with acute on chronic hypoxic/hypercapnic respiratory failure, severe pulmonary hypertension, acute on chronic diastolic heart failure and RV failure.  She required milrinone , with transition to sildenafil .  She was diuresed and ultimately discharged on torsemide .  Most recent echo 02/15/2021 showed EF 55 to 60%, moderate LVH, severely elevated pulmonary systolic pressure (61.4 mmHg), normal RV size, RV function not well visualized, moderate TR.  LHC/RHC 11/30/2020 showed nonobstructive CAD (30% mid LAD, 30% distal LAD), moderate pulm hypertension (RA 2, RV 76/0, PA 72/16/38, PW 18, LVEDP 9, CI 3.6).  Since last clinic visit, she reports she is doing okay. Denies any chest pain, dyspnea, lightheadedness, syncope, lower extremity edema, or palpitations.  Reports has not been compliant with CPAP.  Not exercising.   Wt Readings from Last 3 Encounters:  05/11/23 172 lb 3.2 oz (78.1 kg)  02/15/23 177 lb (80.3 kg)  07/25/22 162 lb (73.5 kg)     Past Medical History:  Diagnosis Date   Aortic atherosclerosis (HCC) 10/08/2018   Arthritis    Chronic kidney disease, stage  3a (HCC)    Chronic respiratory failure (HCC)    Diabetes mellitus without complication (HCC)    GERD (gastroesophageal reflux disease)    Hepatic steatosis 10/08/2018   Hyperlipidemia    Hypertension    Hyperthyroidism    Mass of right ovary 10/08/2018   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/w benign thyroid  tissue   Mild CAD    Osteoporosis    Pneumonia    Renal infarct (HCC)    SBO (small bowel obstruction) (HCC) 08/09/2018   Severe pulmonary hypertension (HCC)    Stroke (cerebrum) (HCC)     Past Surgical History:  Procedure Laterality Date   CATARACT EXTRACTION W/ INTRAOCULAR LENS  IMPLANT, BILATERAL     COLONOSCOPY     LEFT HEART CATH AND CORONARY ANGIOGRAPHY N/A 11/30/2020   Procedure: LEFT HEART CATH AND CORONARY ANGIOGRAPHY;  Surgeon: Thukkani, Arun K, MD;  Location: [facility];  Service: Cardiovascular;  Laterality: N/A;   RIGHT/LEFT HEART CATH AND CORONARY ANGIOGRAPHY N/A 11/30/2020   Procedure: RIGHT/LEFT HEART CATH AND CORONARY ANGIOGRAPHY;  Surgeon: Kyra Phy, MD;  Location: [facility];  Service: Cardiovascular;  Laterality: N/A;    Current Medications: Current Meds  Medication Sig   acetaminophen  (TYLENOL ) 500 MG tablet Take 1,000 mg by mouth every 6 (six) hours as needed for fever or headache (pain).   albuterol  (PROVENTIL ) (2.5 MG/3ML) 0.083% nebulizer solution Take 2.5 mg by nebulization every 6 (six) hours as needed for wheezing or shortness of breath.   albuterol  (VENTOLIN  HFA) 108 (90 Base) MCG/ACT inhaler Inhale 2 puffs into the lungs every  6 (six) hours as needed for wheezing or shortness of breath.   alendronate  (FOSAMAX ) 70 MG tablet Take 70 mg by mouth once a week.   atorvastatin  (LIPITOR ) 80 MG tablet Take 1 tablet (80 mg total) by mouth every evening.   calcium  carbonate (OSCAL) 1500 (600 Ca) MG TABS tablet Take 600 mg of elemental calcium  by mouth 2 (two) times daily with a meal.   carvedilol  (COREG ) 25 MG tablet Take 25 mg by  mouth 2 (two) times daily with a meal.   cetirizine (ZYRTEC) 10 MG tablet Take 10 mg by mouth daily.   clopidogrel  (PLAVIX ) 75 MG tablet Take 1 tablet (75 mg total) by mouth daily.   diclofenac  sodium (VOLTAREN ) 1 % GEL Apply 4 g topically 4 (four) times daily as needed for pain. shoulder   FARXIGA  10 MG TABS tablet Take 10 mg by mouth daily.   fluticasone  (FLONASE ) 50 MCG/ACT nasal spray Place 1 spray into both nostrils daily.   gabapentin  (NEURONTIN ) 100 MG capsule Take 100 mg by mouth at bedtime.   guaiFENesin  (MUCINEX ) 600 MG 12 hr tablet Take 600 mg by mouth 2 (two) times daily as needed.   LUMIGAN 0.01 % SOLN Place 1 drop into the right eye at bedtime.   melatonin 5 MG TABS Take 10 mg by mouth at bedtime.   methimazole  (TAPAZOLE ) 5 MG tablet Take 0.5 tablets (2.5 mg total) by mouth daily.   Omega-3 Fatty Acids (FISH OIL PO) Take 1 capsule by mouth every morning.   OXYGEN  Inhale 4 L into the lungs continuous.   pantoprazole  (PROTONIX ) 40 MG tablet Take 1 tablet (40 mg total) by mouth daily at 6 (six) AM.   potassium chloride  (KLOR-CON ) 10 MEQ tablet Take 10 mEq by mouth daily.   sacubitril -valsartan  (ENTRESTO ) 49-51 MG Take 1 tablet by mouth 2 (two) times daily.   sildenafil  (REVATIO ) 20 MG tablet Take 1 tablet (20 mg total) by mouth 3 (three) times daily.   Torsemide  40 MG TABS Take 40 mg by mouth 2 (two) times daily.   vitamin B-12 (CYANOCOBALAMIN ) 1000 MCG tablet Take 1,000 mcg by mouth daily.   vitamin C (ASCORBIC ACID ) 500 MG tablet Take 500 mg by mouth daily.   Vitamin D , Cholecalciferol , 25 MCG (1000 UT) TABS Take 1,000 Units by mouth daily.   zinc  gluconate 50 MG tablet Take 50 mg by mouth daily.     Allergies:   Patient has no known allergies.   Social History   Socioeconomic History   Marital status: Widowed    Spouse name: Not on file   Number of children: Not on file   Years of education: Not on file   Highest education level: Not on file  Occupational History    Not on file  Tobacco Use   Smoking status: Never   Smokeless tobacco: Never  Vaping Use   Vaping status: Never Used  Substance and Sexual Activity   Alcohol use: No   Drug use: No   Sexual activity: Not on file  Other Topics Concern   Not on file  Social History Narrative   Patient is from [place]   Speaks Krio   Social Drivers of Health   Financial Resource Strain: Medium Risk (06/07/2021)   Overall Financial Resource Strain (CARDIA)    Difficulty of Paying Living Expenses: Somewhat hard  Food Insecurity: No Food Insecurity (06/07/2021)   Hunger Vital Sign    Worried About Running Out of Food in the Last  Year: Never true    Ran Out of Food in the Last Year: Never true  Transportation Needs: No Transportation Needs (06/07/2021)   PRAPARE - Administrator, Civil Service (Medical): No    Lack of Transportation (Non-Medical): No  Physical Activity: Not on file  Stress: Not on file  Social Connections: Not on file     Family History: The patient's family history includes Kidney disease in her son. There is no history of Colon cancer or Breast cancer.  ROS:   Please see the history of present illness.     All other systems reviewed and are negative.  EKGs/Labs/Other Studies Reviewed:    The following studies were reviewed today:   EKG:   12/15/2021: Normal sinus rhythm, rate 66, LVH, Q waves in V1/2 05/11/23: Normal sinus rhythm, rate 72, Q waves in V1/2  Recent Labs: 06/24/2022: Magnesium  2.5 08/18/2022: ALT 21; BUN 39; Creat 1.72; Hemoglobin 14.1; Platelets 237; Potassium 4.3; Sodium 140; TSH 2.59 02/15/2023: Pro B Natriuretic peptide (BNP) 94.0  Recent Lipid Panel    Component Value Date/Time   CHOL 182 08/18/2022 1710   TRIG 86 08/18/2022 1710   HDL 61 08/18/2022 1710   CHOLHDL 3.0 08/18/2022 1710   VLDL 11 02/05/2021 0243   LDLCALC 103 (H) 08/18/2022 1710    Physical Exam:    VS:  BP 138/72 (BP Location: Right Arm, Patient Position: Sitting,  Cuff Size: Large)   Wt 172 lb 3.2 oz (78.1 kg)   BMI 34.78 kg/m     Wt Readings from Last 3 Encounters:  05/11/23 172 lb 3.2 oz (78.1 kg)  02/15/23 177 lb (80.3 kg)  07/25/22 162 lb (73.5 kg)     GEN:  in no acute distress HEENT: Normal NECK: + JVD; No carotid bruits CARDIAC: RRR, no murmurs, rubs, gallops RESPIRATORY:  Clear to auscultation without rales, wheezing or rhonchi  ABDOMEN: Soft, non-tender, non-distended MUSCULOSKELETAL:  No edema; No deformity  SKIN: Warm and dry NEUROLOGIC:  Alert and oriented x 3 PSYCHIATRIC:  Normal affect   ASSESSMENT:    1. Chronic diastolic CHF (congestive heart failure) (HCC)   2. Pulmonary hypertension, unspecified (HCC)   3. Essential hypertension   4. Stage 3a chronic kidney disease (HCC)      PLAN:    Pulmonary hypertension:  LHC/RHC 11/30/2020 showed nonobstructive CAD (30% mid LAD, 30% distal LAD), severe pulm hypertension (RA 2, RV 76/0, PA 72/16/38, PW 18, LVEDP 9, CI 3.6).  Follows with Dr. Marygrace Snellen.  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  On sildenafil .  On torsemide  40 mg twice daily.  Appears euvolemic  Chronic diastolic heart failure/RV failure: On torsemide  40 mg BID.  Most recent echo 05/2021 EF 55 to 60%, severe LVH, grade 2 diastolic dysfunction, moderate RV dysfunction, PASP 68 mmHg, moderate TR -Appears euvolemic, continue torsemide  40 mg twice daily -Continue Farxiga  10 mg daily -Continue Entresto  49-51 mg twice daily -Continue Coreg  6.25 mg twice daily -Check BMET, magnesium  -Update echo  CVA: On Plavix , atorvastatin   Hyperlipidemia: On atorvastatin  80 mg daily.  LDL 111 04/01/2021  Hypertension: On carvedilol  25 mg twice daily, Entresto  49-51 mg twice daily.  CKD stage IIIa: Baseline creatinine 1.3-1.5.  Check BMET  Chronic anemia: Has improved, most recent hemoglobin 14.1 on 08/2022  OSA: on CPAP, encouraged compliance   RTC in 6 months  Medication Adjustments/Labs and Tests Ordered: Current medicines are  reviewed at length with the patient today.  Concerns regarding medicines are outlined  above.  Orders Placed This Encounter  Procedures   Basic Metabolic Panel (BMET)   Magnesium    EKG 12-Lead   ECHOCARDIOGRAM COMPLETE   No orders of the defined types were placed in this encounter.   Patient Instructions  Medication Instructions:  Continue current medications *If you need a refill on your cardiac medications before your next appointment, please call your pharmacy*  Lab Work: Bmp, mg today If you have labs (blood work) drawn today and your tests are completely normal, you will receive your results only by: MyChart Message (if you have MyChart) OR A paper copy in the mail If you have any lab test that is abnormal or we need to change your treatment, we will call you to review the results.  Testing/Procedures: ECHO  Your physician has requested that you have an echocardiogram. Echocardiography is a painless test that uses sound waves to create images of your heart. It provides your doctor with information about the size and shape of your heart and how well your heart's chambers and valves are working. This procedure takes approximately one hour. There are no restrictions for this procedure. Please do NOT wear cologne, perfume, aftershave, or lotions (deodorant is allowed). Please arrive 15 minutes prior to your appointment time.  Please note: We ask at that you not bring children with you during ultrasound (echo/ vascular) testing. Due to room size and safety concerns, children are not allowed in the ultrasound rooms during exams. Our front office staff cannot provide observation of children in our lobby area while testing is being conducted. An adult accompanying a patient to their appointment will only be allowed in the ultrasound room at the discretion of the ultrasound technician under special circumstances. We apologize for any inconvenience.   Follow-Up: At [facility],  you and your health needs are our priority.  As part of our continuing mission to provide you with exceptional heart care, our providers are all part of one team.  This team includes your primary Cardiologist (physician) and Advanced Practice Providers or APPs (Physician Assistants and Nurse Practitioners) who all work together to provide you with the care you need, when you need it.  Your next appointment:   6 month(s)  Provider:   Wendie Hamburg, MD    We recommend signing up for the patient portal called "MyChart".  Sign up information is provided on this After Visit Summary.  MyChart is used to connect with patients for Virtual Visits (Telemedicine).  Patients are able to view lab/test results, encounter notes, upcoming appointments, etc.  Non-urgent messages can be sent to your provider as well.   To learn more about what you can do with MyChart, go to [email or web address].   Other Instructions NONE       Signed, Wendie Hamburg, MD  05/11/2023 10:13 AM    [facility]

## 2023-05-11 NOTE — Patient Instructions (Signed)
 Medication Instructions:  Continue current medications *If you need a refill on your cardiac medications before your next appointment, please call your pharmacy*  Lab Work: Bmp, mg today If you have labs (blood work) drawn today and your tests are completely normal, you will receive your results only by: MyChart Message (if you have MyChart) OR A paper copy in the mail If you have any lab test that is abnormal or we need to change your treatment, we will call you to review the results.  Testing/Procedures: ECHO  Your physician has requested that you have an echocardiogram. Echocardiography is a painless test that uses sound waves to create images of your heart. It provides your doctor with information about the size and shape of your heart and how well your heart's chambers and valves are working. This procedure takes approximately one hour. There are no restrictions for this procedure. Please do NOT wear cologne, perfume, aftershave, or lotions (deodorant is allowed). Please arrive 15 minutes prior to your appointment time.  Please note: We ask at that you not bring children with you during ultrasound (echo/ vascular) testing. Due to room size and safety concerns, children are not allowed in the ultrasound rooms during exams. Our front office staff cannot provide observation of children in our lobby area while testing is being conducted. An adult accompanying a patient to their appointment will only be allowed in the ultrasound room at the discretion of the ultrasound technician under special circumstances. We apologize for any inconvenience.   Follow-Up: At [facility], you and your health needs are our priority.  As part of our continuing mission to provide you with exceptional heart care, our providers are all part of one team.  This team includes your primary Cardiologist (physician) and Advanced Practice Providers or APPs (Physician Assistants and Nurse Practitioners) who all  work together to provide you with the care you need, when you need it.  Your next appointment:   6 month(s)  Provider:   Wendie Hamburg, MD    We recommend signing up for the patient portal called "MyChart".  Sign up information is provided on this After Visit Summary.  MyChart is used to connect with patients for Virtual Visits (Telemedicine).  Patients are able to view lab/test results, encounter notes, upcoming appointments, etc.  Non-urgent messages can be sent to your provider as well.   To learn more about what you can do with MyChart, go to [email or web address].   Other Instructions NONE

## 2023-05-12 ENCOUNTER — Encounter: Payer: Self-pay | Admitting: *Deleted

## 2023-05-12 LAB — BASIC METABOLIC PANEL WITH GFR
BUN/Creatinine Ratio: 22 (ref 12–28)
BUN: 30 mg/dL — ABNORMAL HIGH (ref 8–27)
CO2: 22 mmol/L (ref 20–29)
Calcium: 9.7 mg/dL (ref 8.7–10.3)
Chloride: 101 mmol/L (ref 96–106)
Creatinine, Ser: 1.36 mg/dL — ABNORMAL HIGH (ref 0.57–1.00)
Glucose: 176 mg/dL — ABNORMAL HIGH (ref 70–99)
Potassium: 4 mmol/L (ref 3.5–5.2)
Sodium: 143 mmol/L (ref 134–144)
eGFR: 39 mL/min/{1.73_m2} — ABNORMAL LOW (ref >59)

## 2023-05-12 LAB — MAGNESIUM: Magnesium: 2.2 mg/dL (ref 1.6–2.3)

## 2023-06-15 ENCOUNTER — Ambulatory Visit (HOSPITAL_COMMUNITY): Attending: Cardiology

## 2023-06-19 ENCOUNTER — Encounter (HOSPITAL_COMMUNITY): Payer: Self-pay | Admitting: Cardiology

## 2024-01-22 ENCOUNTER — Ambulatory Visit

## 2024-01-22 ENCOUNTER — Ambulatory Visit: Payer: Self-pay

## 2024-01-22 VITALS — BP 164/111 | HR 88 | Temp 98.0°F | Ht 59.0 in | Wt 178.1 lb

## 2024-01-22 DIAGNOSIS — I272 Pulmonary hypertension, unspecified: Secondary | ICD-10-CM

## 2024-01-22 DIAGNOSIS — I2721 Secondary pulmonary arterial hypertension: Secondary | ICD-10-CM

## 2024-01-22 DIAGNOSIS — K219 Gastro-esophageal reflux disease without esophagitis: Secondary | ICD-10-CM

## 2024-01-22 DIAGNOSIS — R052 Subacute cough: Secondary | ICD-10-CM

## 2024-01-22 LAB — POC COVID19/FLU A&B COMBO
Covid Antigen, POC: NEGATIVE
Influenza A Antigen, POC: NEGATIVE
Influenza B Antigen, POC: NEGATIVE

## 2024-01-22 MED ORDER — BENZONATATE 100 MG PO CAPS: 100.0000 mg | ORAL_CAPSULE | Freq: Four times a day (QID) | ORAL | 1 refills | Status: AC | PRN

## 2024-01-22 MED ORDER — FAMOTIDINE 20 MG PO TABS: 20.0000 mg | ORAL_TABLET | Freq: Two times a day (BID) | ORAL | Status: AC

## 2024-01-22 NOTE — Progress Notes (Signed)
 "   Subjective:   PATIENT ID: Mandy [name] DOB: 10-Dec-1940, MRN: [id number]   HPI Discussed the use of AI scribe software for clinical note transcription with the patient, who gave verbal consent to proceed.  History of Present Illness Mandy [name] is an 84 year old [name] with pulmonary hypertension who presents with cough and wheezing.  She has had a dry cough for approximately three weeks without any preceding flu or cold symptoms. She was prescribed antibiotics by her primary doctor, which she took for five days, but the cough has persisted. She also experiences wheezing. She denies shortness of breath and mentions some weight gain, though the amount is unspecified. She occasionally uses supplemental oxygen  when her cough worsens.  Her medical history includes pulmonary hypertension, for which she is taking sildenafil  20 mg three times a day. Her last right heart catheterization in 2022 showed a mean pulmonary artery pressure of 39 mmHg and a pulmonary vascular resistance of 3.44 Wood units. The mean wedge pressure was 18 mmHg, and the LVEDP was 9 mmHg. Her most recent echocardiogram in 2023 indicated an LVEF of 55-60%, moderately reduced RV systolic function, and mildly enlarged RV size. A chest x-ray from 2023 showed persistent moderate right pleural effusion and left pleural effusion.  She reports occasional heartburn for which she takes medication. No leg swelling or significant weight gain, although she mentions some weight gain without specifying the amount.     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   Mild CAD    Osteoporosis    Pneumonia    Renal infarct    SBO (small bowel obstruction) (HCC) 08/09/2018   Severe pulmonary hypertension (HCC)    Stroke (cerebrum) (HCC)      Family History  Problem Relation Age of Onset   Kidney disease Son    Colon cancer Neg Hx    Breast cancer Neg Hx      Social History   Socioeconomic History   Marital status: Widowed    Spouse name: Not on file   Number of children: Not on file   Years of education: Not on file   Highest education level: Not on file  Occupational History   Not on file  Tobacco Use   Smoking status: Never   Smokeless tobacco: Never  Vaping Use   Vaping status: Never Used  Substance and Sexual Activity   Alcohol use: No   Drug use: No   Sexual activity: Not on file  Other Topics Concern   Not on file  Social History Narrative   Patient is from [place]   Speaks Krio   Social Drivers of Health   Tobacco Use: Low Risk (02/15/2023)   Patient History    Smoking Tobacco Use: Never    Smokeless Tobacco Use: Never    Passive Exposure: Not on file  Financial Resource Strain: Medium Risk (06/07/2021)   Overall Financial Resource Strain (CARDIA)    Difficulty of Paying Living Expenses: Somewhat hard  Food Insecurity: No Food Insecurity (06/07/2021)  Hunger Vital Sign    Worried About Running Out of Food in the Last Year: Never true    Ran Out of Food in the Last Year: Never true  Transportation Needs: No Transportation Needs (06/07/2021)   PRAPARE - Administrator, Civil Service (Medical): No    Lack of Transportation (Non-Medical): No  Physical Activity: Not on file  Stress: Not on file  Social Connections: Not on file  Intimate Partner Violence: Not on file  Depression (EYV7-0): Not on file  Alcohol Screen: Not on file  Housing: Medium Risk (06/07/2021)   Housing    Last Housing Risk Score: 1  Utilities: Not on file  Health Literacy: Not on file      Allergies[1]   Outpatient Medications Prior to Visit  Medication Sig Dispense Refill   acetaminophen  (TYLENOL ) 500 MG tablet Take 1,000 mg by mouth every 6 (six) hours as needed for fever or headache (pain).     albuterol  (PROVENTIL ) (2.5 MG/3ML) 0.083% nebulizer solution Take 2.5 mg by nebulization every 6 (six) hours as needed for wheezing or shortness of breath.     albuterol  (VENTOLIN  HFA) 108 (90 Base) MCG/ACT inhaler Inhale 2 puffs into the lungs every 6 (six) hours as needed for wheezing or shortness of breath. 18 g 0   alendronate  (FOSAMAX ) 70 MG tablet Take 70 mg by mouth once a week.     atorvastatin  (LIPITOR ) 80 MG tablet Take 1 tablet (80 mg total) by mouth every evening. 30 tablet 1   calcium  carbonate (OSCAL) 1500 (600 Ca) MG TABS tablet Take 600 mg of elemental calcium  by mouth 2 (two) times daily with a meal.     carvedilol  (COREG ) 25 MG tablet Take 25 mg by mouth 2 (two) times daily with a meal.     cetirizine (ZYRTEC) 10 MG tablet Take 10 mg by mouth daily.     clopidogrel  (PLAVIX ) 75 MG tablet Take 1 tablet (75 mg total) by mouth daily. 30 tablet 2   diclofenac  sodium (VOLTAREN ) 1 % GEL Apply 4 g topically 4 (four) times daily as needed for pain. shoulder     FARXIGA  10 MG TABS tablet Take 10 mg by mouth daily.     fluticasone  (FLONASE ) 50 MCG/ACT nasal spray Place 1 spray into both nostrils daily. 1 mL 2   gabapentin  (NEURONTIN ) 100 MG capsule Take 100 mg by mouth at bedtime.     guaiFENesin  (MUCINEX ) 600 MG 12 hr tablet Take 600 mg by mouth 2 (two) times daily as needed.     LUMIGAN 0.01 % SOLN Place 1 drop into the right eye at bedtime.     melatonin 5 MG TABS Take 10 mg by mouth at bedtime.     methimazole  (TAPAZOLE ) 5 MG tablet Take 0.5 tablets (2.5 mg total) by mouth daily. 30 tablet 0   Multiple Vitamin (MULTIVITAMIN WITH MINERALS) TABS tablet Take 1 tablet by mouth every morning.     Omega-3 Fatty Acids (FISH OIL PO) Take 1 capsule by mouth every morning.      OXYGEN  Inhale 4 L into the lungs continuous.     pantoprazole  (PROTONIX ) 40 MG tablet Take 1 tablet (40 mg total) by mouth daily at 6 (six) AM. 30 tablet 0   potassium chloride  (KLOR-CON ) 10 MEQ tablet Take 10 mEq by mouth daily.     sacubitril -valsartan  (ENTRESTO ) 49-51 MG Take 1 tablet by mouth 2 (two) times daily. 180 tablet 3   sildenafil  (REVATIO ) 20 MG  tablet Take 1 tablet (20 mg total) by mouth 3 (three) times daily. 270 tablet 3   Torsemide  40 MG TABS Take 40 mg by mouth 2 (two) times daily. 60 tablet 1   vitamin B-12 (CYANOCOBALAMIN ) 1000 MCG tablet Take 1,000 mcg by mouth daily.     vitamin C (ASCORBIC ACID ) 500 MG tablet Take 500 mg by mouth daily.     Vitamin D , Cholecalciferol , 25 MCG (1000 UT) TABS Take 1,000 Units by mouth daily.     zinc  gluconate 50 MG tablet Take 50 mg by mouth daily.     No facility-administered medications prior to visit.    ROS Reviewed all systems and reported negative except as above     Objective:  There were no vitals filed for this visit.  Physical Exam Physical Exam GENERAL: Appropriate to age, no acute distress. HEAD EYES EARS NOSE THROAT: Moist mucous membranes, atraumatic, normocephalic. CHEST: Clear to auscultation bilaterally, no wheezing, no crackles, no rales. CARDIAC: Regular rate and rhythm, normal S1, normal S2, no murmurs, no rubs, no gallops. ABDOMEN: Soft, nontender. NEUROLOGICAL: Motor and sensation grossly intact, alert and oriented times X 3. EXTREMITIES: Warm, well perfused, no edema.     CBC    Component Value Date/Time   WBC 4.8 08/18/2022 1710   RBC 5.13 (H) 08/18/2022 1710   HGB 14.1 08/18/2022 1710   HGB 13.7 12/15/2021 1007   HCT 43.4 08/18/2022 1710   HCT 41.8 12/15/2021 1007   PLT 237 08/18/2022 1710   PLT 257 12/15/2021 1007   MCV 84.6 08/18/2022 1710   MCV 85 12/15/2021 1007   MCH 27.5 08/18/2022 1710   MCHC 32.5 08/18/2022 1710   RDW 13.9 08/18/2022 1710   RDW 15.1 12/15/2021 1007   LYMPHSABS  1.2 02/18/2021 0253   MONOABS 0.8 02/18/2021 0253   EOSABS 0.1 02/18/2021 0253   BASOSABS 0.0 02/18/2021 0253     Results Labs COVID-19 PCR: Negative Influenza PCR: Negative  Radiology Chest X-ray (2023): Persistent moderate right pleural effusion, left pleural effusion  Diagnostic Right heart catheterization (2022): Mean pulmonary artery pressure 39 mmHg, pulmonary vascular resistance 3.44 Wood units, mean wedge pressure 18 mmHg, LVEDP 9 mmHg Echocardiogram (2023): LVEF 55-60%, right ventricular systolic function moderately reduced, right ventricular size mildly enlarged   PFT:    Latest Ref Rng & Units 06/17/2021   10:03 AM  PFT Results  FVC-Pre L 0.78   FVC-Predicted Pre % 52   FVC-Post L 0.81   FVC-Predicted Post % 54   Pre FEV1/FVC % % 82   Post FEV1/FCV % % 87   FEV1-Pre L 0.65   FEV1-Predicted Pre % 57   FEV1-Post L 0.71   DLCO uncorrected ml/min/mmHg 6.85   DLCO UNC% % 43   DLVA Predicted % 52   TLC L 4.02   TLC % Predicted % 93   RV % Predicted % 149          Assessment & Plan:   Assessment and Plan Assessment & Plan Subacute cough She does not appear volume overloaded on exam. Dry cough for three weeks, no mucus, negative COVID/flu tests, possible GERD association. - Ordered chest x-ray. - Prescribed Tessalon  Perles and Pepcid  - Provided GERD diet instructions.  Pulmonary hypertension Group 1 and possible group 2. Managed with sildenafil . Echocardiogram: moderately reduced RV systolic function, mildly enlarged RV. Persistent moderate right pleural effusion. No significant dyspnea or edema. - Continue sildenafil  20 mg TID. - Ordered chest x-ray for pleural effusion  assessment.  Gastroesophageal reflux disease Intermittent heartburn possibly contributing to cough. - Prescribed heartburn medication. - Provided GERD diet instructions.        Mandy Herter, MD [facility] Office: [phone]       [1] No Known  Allergies  "

## 2024-01-22 NOTE — Telephone Encounter (Signed)
 FYI Only or Action Required?: FYI only for provider: appointment scheduled on 01/22/24.  Patient is followed in Pulmonology for pulmonary HTN, last seen on 02/15/2023 by Hunsucker, Donnice SAUNDERS, MD.  Called Nurse Triage reporting Cough.  Symptoms began 5 days ago .  Interventions attempted: OTC medications: Robitussin, Prescription medications: Albuterol  MDI, and Nebulizer treatments.  Symptoms are: unchanged.  Triage Disposition: See HCP Within 4 Hours (Or PCP Triage)  Patient/caregiver understands and will follow disposition?: Yes     Message from [facility] sent at 01/22/2024  3:04 PM EST  Reason for Triage: Patient wheezing and coughing.   Reason for Disposition  Wheezing is present  Answer Assessment - Initial Assessment Questions 1. ONSET: When did the cough begin?      5 days ago  2. SEVERITY: How bad is the cough today?      Worse when supine - occasional  3. SPUTUM: Describe the color of your sputum (e.g., none, dry cough; clear, white, yellow, green)     Not production 4. HEMOPTYSIS: Are you coughing up any blood? If Yes, ask: How much? (e.g., flecks, streaks, tablespoons, etc.)     Not coughing up anything  5. DIFFICULTY BREATHING: Are you having difficulty breathing? If Yes, ask: How bad is it? (e.g., mild, moderate, severe)      No- using maintenance and rescue inhaler taking as scheduled used rescue inhaler x 1  6. FEVER: Do you have a fever? If Yes, ask: What is your temperature, how was it measured, and when did it start?     no 7. CARDIAC HISTORY: Do you have any history of heart disease? (e.g., heart attack, congestive heart failure)      HTN  8. LUNG HISTORY: Do you have any history of lung disease?  (e.g., pulmonary embolus, asthma, emphysema)     Pulmonary HTN  9. PE RISK FACTORS: Do you have a history of blood clots? (or: recent major surgery, recent prolonged travel, bedridden)     no 10. OTHER SYMPTOMS: Do you have any other  symptoms? (e.g., runny nose, wheezing, chest pain)       Wheezing 3 week// prn oxygen  with O2 or low oxygen  //95% at time of call  Protocols used: Cough - Acute Non-Productive-A-AH

## 2024-01-22 NOTE — Patient Instructions (Signed)
 GERD diet instructions   Foods to Avoid:  Acidic foods: Citrus fruits, tomatoes, tomato-based products, vinegar, garlic, onions  Fatty foods: Fried foods, fatty meats, whole milk, cheese  Spicy foods: Chili peppers, black pepper, mustard  Chocolate: Contains caffeine and theobromine, which relax the lower esophageal sphincter (LES)  Alcohol: Relaxes the LES and increases stomach acid production  Caffeine: Found in coffee, tea, and some sodas, it can stimulate stomach acid production    Foods to Eat:  Lean proteins: Chicken, fish, eggs Non-acidic fruits: Apples, bananas, pears, grapes Vegetables: Steamed, roasted, or boiled vegetables (e.g., broccoli, carrots, green beans) Whole grains: Brown rice, quinoa, oatmeal Low-fat dairy: Skim milk, low-fat yogurt, low-fat cheese Water: Staying hydrated helps dilute stomach acid    Other Dietary Recommendations: Eat smaller, more frequent meals. Avoid eating within 2-3 hours of bedtime. Chew food thoroughly. Limit sugary drinks and processed foods. Consider a Mediterranean-style diet, which is rich in fruits, vegetables, and whole grains.     VISIT SUMMARY: During your visit, we discussed your persistent dry cough and wheezing, which have been ongoing for three weeks. We also reviewed your pulmonary hypertension and occasional heartburn.  YOUR PLAN: -SUBACUTE COUGH: A subacute cough is a cough that lasts between three to eight weeks. Your cough has been dry and persistent for three weeks. We have ordered a chest x-ray to further investigate the cause and prescribed cough medicine to help alleviate your symptoms. Additionally, we provided you with dietary instructions to manage potential GERD (gastroesophageal reflux disease) that might be contributing to your cough.  -PULMONARY HYPERTENSION: Pulmonary hypertension is high blood pressure in the arteries of your lungs. You are currently managing this condition with sildenafil . Your recent  echocardiogram showed moderately reduced right ventricular function and mildly enlarged right ventricle. We have ordered a chest x-ray to assess the pleural effusion (fluid around your lungs). Continue taking sildenafil  20 mg three times a day as prescribed.  -GASTROESOPHAGEAL REFLUX DISEASE (GERD): GERD is a condition where stomach acid frequently flows back into the tube connecting your mouth and stomach, causing heartburn. This may be contributing to your cough. We have prescribed medication to manage your heartburn and provided dietary instructions to help reduce symptoms.  INSTRUCTIONS: Please follow up with the chest x-ray as ordered. Continue taking your medications as prescribed and follow the dietary instructions provided for GERD. If your symptoms persist or worsen, please schedule another appointment.    Contains text generated by [employer].

## 2024-03-05 ENCOUNTER — Ambulatory Visit: Admitting: Pulmonary Disease
